# Patient Record
Sex: Female | Born: 1960
Health system: Midwestern US, Community
[De-identification: ages and names within clinical notes are randomized; demographics above are authoritative.]

## PROBLEM LIST (undated history)

## (undated) DIAGNOSIS — J441 Chronic obstructive pulmonary disease with (acute) exacerbation: Principal | ICD-10-CM

## (undated) DIAGNOSIS — Z1231 Encounter for screening mammogram for malignant neoplasm of breast: Secondary | ICD-10-CM

## (undated) DIAGNOSIS — I480 Paroxysmal atrial fibrillation: Principal | ICD-10-CM

## (undated) DIAGNOSIS — R0789 Other chest pain: Secondary | ICD-10-CM

## (undated) DIAGNOSIS — K7200 Acute and subacute hepatic failure without coma: Secondary | ICD-10-CM

## (undated) DIAGNOSIS — F419 Anxiety disorder, unspecified: Secondary | ICD-10-CM

## (undated) DIAGNOSIS — K859 Acute pancreatitis without necrosis or infection, unspecified: Secondary | ICD-10-CM

## (undated) DIAGNOSIS — H269 Unspecified cataract: Secondary | ICD-10-CM

## (undated) DIAGNOSIS — I509 Heart failure, unspecified: Secondary | ICD-10-CM

## (undated) HISTORY — PX: ABDOMINAL HYSTERECTOMY: SHX81

---

## 2006-08-21 DIAGNOSIS — K7200 Acute and subacute hepatic failure without coma: Secondary | ICD-10-CM

## 2006-08-21 HISTORY — DX: Acute and subacute hepatic failure without coma: K72.00

## 2007-09-06 LAB — CBC WITH AUTOMATED DIFF
ABS. BASOPHILS: 0 10*3/uL (ref 0.0–0.1)
ABS. EOSINOPHILS: 0.1 10*3/uL (ref 0.0–0.4)
ABS. LYMPHOCYTES: 2.6 10*3/uL (ref 0.8–3.5)
ABS. MONOCYTES: 0.5 10*3/uL (ref 0–1.0)
ABS. NEUTROPHILS: 8.6 10*3/uL — ABNORMAL HIGH (ref 1.8–8.0)
BAND NEUTROPHILS: 1 % (ref 0–6)
BASOPHILS: 0 % (ref 0–1)
EOSINOPHILS: 1 % (ref 0–7)
HCT: 27 % — ABNORMAL LOW (ref 35.0–47.0)
HGB: 8 g/dL — ABNORMAL LOW (ref 11.5–16.0)
LYMPHOCYTES: 22 % (ref 12–49)
MCH: 23 PG — ABNORMAL LOW (ref 26.0–34.0)
MCHC: 29.6 g/dL — ABNORMAL LOW (ref 30.0–35.0)
MCV: 77.6 FL — ABNORMAL LOW (ref 80.0–99.0)
MONOCYTES: 4 % — ABNORMAL LOW (ref 5–13)
NEUTROPHILS: 72 % (ref 32–75)
PLATELET: 212 10*3/uL (ref 150–400)
RBC: 3.48 M/uL — ABNORMAL LOW (ref 3.80–5.20)
RDW: 23.3 % — ABNORMAL HIGH (ref 11.5–14.5)
WBC: 11.8 10*3/uL — ABNORMAL HIGH (ref 3.6–11.0)

## 2007-09-06 LAB — URINALYSIS W/ REFLEX CULTURE
Blood: NEGATIVE
Glucose: NEGATIVE MG/DL
Nitrites: POSITIVE — AB
Protein: 30 MG/DL — AB
Specific gravity: 1.026 (ref 1.003–1.030)
Urobilinogen: 2 EU/DL — ABNORMAL HIGH (ref 0.2–1.0)
pH (UA): 6 (ref 5.0–8.0)

## 2007-09-06 LAB — PROTHROMBIN TIME + INR
INR: 1.6 — ABNORMAL HIGH (ref 0.9–1.2)
Prothrombin time: 18 SECS — ABNORMAL HIGH (ref 9.5–13.1)

## 2007-09-06 LAB — METABOLIC PANEL, COMPREHENSIVE
A-G Ratio: 0.5 — ABNORMAL LOW (ref 1.1–2.2)
ALT (SGPT): 44 U/L (ref 30–65)
AST (SGOT): 87 U/L — ABNORMAL HIGH (ref 15–37)
Albumin: 2 g/dL — ABNORMAL LOW (ref 3.5–5.0)
Alk. phosphatase: 221 U/L — ABNORMAL HIGH (ref 50–136)
Anion gap: 7 (ref 5–15)
BUN/Creatinine ratio: 19 (ref 12–20)
BUN: 13 MG/DL (ref 6–20)
Bilirubin, total: 6.5 MG/DL — ABNORMAL HIGH (ref 0–1.0)
CO2: 30 MMOL/L (ref 21–32)
Calcium: 8.2 MG/DL — ABNORMAL LOW (ref 8.5–10.1)
Chloride: 99 MMOL/L (ref 97–108)
Creatinine: 0.7 MG/DL (ref 0.6–1.3)
GFR est AA: >60 mL/min/{1.73_m2} (ref >60)
GFR est non-AA: >60 mL/min/{1.73_m2} (ref >60)
Globulin: 4.3 g/dL — ABNORMAL HIGH (ref 2.0–4.0)
Glucose: 95 MG/DL (ref 65–105)
Potassium: 3.5 MMOL/L (ref 3.5–5.1)
Protein, total: 6.3 g/dL — ABNORMAL LOW (ref 6.4–8.2)
Sodium: 136 MMOL/L (ref 136–145)

## 2007-09-06 LAB — AMYLASE: Amylase: 39 U/L (ref 25–115)

## 2007-09-06 LAB — AMMONIA: Ammonia, plasma: 34 umol/L — ABNORMAL HIGH (ref 11–32)

## 2007-09-06 LAB — CK W/ REFLX CKMB
CK: 33 U/L (ref 21–215)
CK: 20 U/L — ABNORMAL LOW (ref 21–215)

## 2007-09-06 LAB — BILIRUBIN, CONFIRM: Bilirubin UA, confirm: POSITIVE — AB

## 2007-09-06 LAB — TROPONIN I
Troponin-I, Qt.: 0.15 ng/mL — ABNORMAL HIGH (ref 0.0–0.05)
Troponin-I, Qt.: 0.1 ng/mL — ABNORMAL HIGH (ref 0.0–0.05)

## 2007-09-06 LAB — PTT, ACTIVATED: aPTT: 34 s (ref 26.5–37.3)

## 2007-09-06 LAB — LIPASE: Lipase: 279 U/L (ref 114–286)

## 2007-09-06 NOTE — Consults (Signed)
Name: Breanna Lester, Breanna Lester Admitted: 09/06/2007  MR #: [id number] DOB: 1961-01-17  Account #: [id number] Age: 47  Consultant: [name], MD Location: [phone]     CONSULTATION REPORT    DATE OF CONSULTATION: 09/06/2007  REFERRING PHYSICIAN:        CHIEF COMPLAINT: Asked to see by Dr. Jamison Neighbor because of abnormal liver  tests, jaundice, abdominal pain, and abnormal radiographic examination of  the GI tract.    HISTORY OF PRESENT ILLNESS: The history is obtained from the patient and  supplemented from the medical record and my outpatient records.    Breanna Lester is 47 years old. For about the last year and a half, she has  had abdominal pain and diarrhea. There has been alternating diarrhea and  constipation and some bright red rectal bleeding. She has had a total of 3  colonoscopies including one by Dr. Sandie Ano and one by me. A colonoscopy  to the proximal transverse colon on September 09, 2003 was negative. A  colonoscopy on March 12, 2007 was normal. Biopsies of the large bowel  showed hyperplastic polyp.    CT on July 3 showed trace pericholecystic fluid. Left fundal serosal  fibroid. Ultrasound on July 25 shows sludge and wall thickening of the  gallbladder raising question of a calculus cholecystitis. Prominent liver  without focal abnormality.    Mild liver test elevation was noted. Alkaline phosphatase total was 228  with 71% liver fraction (25 to 69). Gamma globulin was normal at 1.1.  Prothrombin time was elevated at 1.3. 5'-nucleotidase was elevated at 19.  C-reactive protein was elevated at 20. Total bilirubin was 1.3. AST was  elevated at 88 and ALT was normal. Hemoglobin was 11.5 (from 11.7 to  15.5). Clostridium difficile toxin was negative.    I make note that protein electrophoresis on 02/27/2007 showed a normal  gamma globulin fraction and albumin total of 3.1 and a modest elevation of  alpha1 globulin at 0.4. No apparent protein spike was seen.     She was lost her followup after a September 12th visit. Multiple attempts  from my office to contact her were not successful, and a letter was sent to  her primary care physician, and she got a copy of this.    She felt as if she got "flu" in the fall of this year. There was weakness  and fatigue. There was some nausea. There was continuing weight loss.  She had upper respiratory symptoms. Some shortness of breath got worse or  fatigue got worse when she came to the ER yesterday. There is no known  liver disease. There is no history of intravenous drug use. There is no  history of significant alcohol use.    FAMILY HISTORY: Negative for liver disease, positive for a sister with  Crohn disease, and for coronary artery disease in her father.    MEDICATIONS: On admission, Zoloft 50 mg per day.    REVIEW OF SYSTEMS: Obtained in detail. There has been some chill. There  has been a 70-pound weight loss. She has chronic sinus symptoms. There is  photophobia, some dizziness, and worsening shortness of breath. There is  also arthritis. There is no abdominal pain. There is no postprandial  pain.    Her most recent upper endoscopy on 09/17/2003 showed healing prepyloric  erosions, irregular Z-line, and mild duodenitis.    PHYSICAL EXAMINATION:  VITAL SIGNS: Temperature 98.2, pulse 111, blood pressure 134/54, and  respirations 18.  GENERAL: Cachectic, temporal wasting.  EYES: Jaundice is present.  ENMT: Unremarkable.  SKIN: Complexion is sallow. There is no palmar erythema. There are large  spiders on the left neck and chest.  CHEST: Diffuse rhonchi and increased breath sounds bilaterally. There is  some labored respiration at baseline.  HEART: Regular rate and rhythm. Tachycardia.  ABDOMEN: Tense, distended, right upper quadrant tenderness.  EXTREMITIES: 1+ edema.  RECTUM: Deferred.  LYMPHATIC: No anterior, posterior, cervical, supraclavicular, or inguinal  lymphadenopathy.     LABORATORY DATA: Shows that hemoglobin is 8. It had been 9.5 on November  4. The platelet count is 212,000 and a white blood cell count is elevated  at 11.8. INR is elevated at 1.6. Sodium and potassium normal. Albumin 2,  globulin 4.3, total bilirubin 6.5, alkaline phosphatase 221, and SGOT 87.  Ammonia 34.    DIAGNOSTIC DATA: I reviewed the CT scan and ultrasound with radiologist.    IMPRESSION REPORT AND PLAN:  1. Probable cirrhosis.  2. Anasarca.  3. Right upper quadrant tenderness.  4. Low albumin.  5. Anemia, not otherwise specified. The MCV is low, suggesting iron  deficiency.  6. Abnormal radiographic examination demonstrating pericholecystic  fluid in sludge or stones.    RECOMMENDATIONS:  1. I have ordered a number of blood tests to be performed to look for  treatable chronic liver disease. This included serologies for hepatitis B  and C, repeat protein electrophoresis, antinuclear antibody,  antimitochondrial antibody, alpha-fetoprotein, iron, TIBC, ferritin,  ceruloplasmin, and uric acid. I will also redraw her coags checking PT and  INR.  2. Paracentesis to look for bacterial peritonitis and infection.  3. Surgery consult. I am concerned on her appearance that she may have  a carcinomatosis. However, she had a surgery in her abdomen recently  (July 02, 2007). The liver was not described in the operative report.  I have spoken to Dr. Cheryll Cockayne to evaluate here. I am concerned about  the possibility of gallbladder carcinoma or some other significant process  that could involve the gallbladder, liver, and the peritoneum.  4. She will likely need a liver biopsy. We will do this more than  likely on 09/09/2007 or 09/10/2007.  5. I discussed with the patient the likelihood of cirrhosis is present  and/or current lack of a diagnosis.  6. She may require other tests including repeat endoscopy, capsule  endoscopy, or other tests to evaluate the anemia.     I thank Dr. Jamison Neighbor for this interesting consultation.          E-Signed By  [name], MD 09/18/2007 13:49    [name], MD    cc: Doree Fudge, M.D.   Ned Grace, MD   [name], MD        RFK/FI2; D: 09/06/2007 4:11 P; T: 09/06/2007 7:58 P; DOC# [id number]; Job#  [id number]

## 2007-09-06 NOTE — H&P (Signed)
Name: Breanna Lester, Breanna Lester Admitted: 09/05/2007  MR #: [id number] DOB: Dec 01, 1960  Account #: [id number] Age: 47  Physician: Pamalee Leyden, MD Location: [phone]     HISTORY PHYSICAL      CHIEF COMPLAINT: Concerns for blood in her urine.    HISTORY OF PRESENT ILLNESS: This is a 47 year old Caucasian female with a  history of weight loss dating back to early last year. She was evaluated  by [name] back in October 2008, at which time she lost close to 50  pounds. Her workup included what sounded like a CT scan of her abdomen and  both upper and lower endoscopies. She reports being diagnosed with a colon  polyp, hemorrhoids, and an old or healed ulcer in her stomach. She was  felt to have possibly irritable bowel syndrome. She has had on and off  episodes of loose stools and abdominal pain. She had a hysterectomy in  November 2008, and since then, she reports additional weight loss close to  20 pounds for a total of about 70 or 80 pounds of weight loss since this  all started. Her appetite has remained poor. She noticed dark urine this  past few weeks, and today, thought she felt blood in her urine. She  presents to the Emergency Room with this complaint. In the ER, the workup  was remarkable for her total bilirubin being elevated at 6.5. Her  urinalysis had no blood but 5 to 10 white cells. Chest x-ray demonstrates  bilateral opacities or infiltrates, and an abdominal ultrasound  demonstrates gallbladder sludge. No intra or extrahepatic biliary duct  dilatation, and the visualized pancreas was felt to be normal. A CT scan  of her abdomen has been requested, and at this point, we were asked to  admit the patient to further evaluate her jaundice and weight loss.    ALLERGIES: No known drug allergies.    MEDICATIONS: Zoloft 50 mg daily.    PAST MEDICAL HISTORY:  1. Anxiety disorder.  2. GERD.  3. DJD.  4. History of what is felt to be IBS.  5. Colonoscopy last year demonstrating 1 polyp and hemorrhoids.   6. Esophagogastroduodenoscopy reportedly demonstrates just an old  healed ulcer.    SOCIAL HISTORY: This patient is married and smokes 1 pack a day. She does  not drink alcohol. She is unemployed.    FAMILY HISTORY: Mother with both breast and uterine cancer. She has a  sister with Crohn disease.    REVIEW OF SYSTEMS: CONSTITUTIONAL: Remarkable for the 70 or 80 pound  weight loss as described. Positive for anorexia. She does not report  fevers or chills. EYES: No complaints. ENT: No sore throat or ear pain.  CARDIOVASCULAR: Denies chest pains or palpitations. RESPIRATORY: She  admits to a somewhat chronic cough, productive occasionally of clear  sputum. No shortness of breath. GI: Positive for on and off diarrhea and  abdominal discomfort. No vomiting. She occasionally is nauseated. GU:  Remarkable for the dark urine, which she thought was blood. SKIN: She was  not aware that she was jaundiced until that was pointed out to her today.  No rashes. NEUROLOGIC: Denies headaches or dizziness. Rest of the system  review is negative.    OBJECTIVE:  VITAL SIGNS: Blood pressure is 110/60, pulse 108, respiratory rate 18,  temperature is 96.7, and room air saturation 95%.  GENERAL APPEARANCE: We have a chronically ill-appearing female, lying  semisupine. She clearly is jaundiced. Eyes are icteric. Mucous membranes  are dry.  NECK: No obvious masses.  RESPIRATORY: No accessory muscle use. No wheezing, bibasilar crackles.  CARDIOVASCULAR: Regular rhythm, tachycardic. No thrills. Trace pitting  edema bilaterally.  ABDOMEN: Appears distended. There is tenderness over her epigastric area  and lower quadrants.  MUSCULOSKELETAL EXAMINATION: Atrophic appearing extremities.  PSYCHIATRIC: A bit anxious.  NEUROLOGIC: She is alert and oriented. Speech is not slurred. Grossly  nonfocal examination.    LABORATORY DATA: White count 11.8, hemoglobin 8, hematocrit 27, and  platelet 212,000. Sodium 136, potassium 3.5, BUN 13, creatinine 0.7,   glucose 95, total bilirubin 6.5, alkaline phosphatase 221, ammonia is 34,  SGOT 87, SGPT 44, CPK is 33, troponin 0.15. Urinalysis, 10 white cells,  positive nitrites, and small leukocyte esterase. Chest x-ray demonstrates  bilateral infiltrates or opacities. Ultrasound of the abdomen demonstrates  gallbladder sludge with equivocal sonographic evidence of acute  cholecystitis. She has nonspecific hepatosplenomegaly that is unchanged.  No dilated ducts.    ASSESSMENT/PLAN:  1. Hyperbilirubinemia/jaundice.  2. Abdominal pain and tenderness.  3. Significant weight loss, close to 80 pounds.  4. Suspect underlying malignancy.  5. Bilateral pulmonary infiltrates.  6. Anemia.  7. Urinary tract infection.  8. Smoker.    This patient needs a CT scan of her abdomen. I will hydrate her overnight  and have [name] consult in the morning. We will send a urine culture  and start her on antibiotics. We will fractionate also her bilirubin with  her next blood work. I am concerned that the pulmonary process may be  related to her intra-abdominal process. I have discussed code status with  her, and she wishes to remain a full code for now.    Total time 60 minutes.            E-Signed By  Pamalee Leyden, MD 09/07/2007 18:08    Pamalee Leyden, MD    cc: Doree Fudge, M.D.   [name], MD   Pamalee Leyden, MD        JLT/FI2; D: 09/06/2007 12:23 A; T: 09/06/2007 3:38 A; DOC# [id number]; Job#  [id number]

## 2007-09-07 LAB — CBC WITH AUTOMATED DIFF
ABS. BASOPHILS: 0 10*3/uL (ref 0.0–0.1)
ABS. EOSINOPHILS: 0.1 10*3/uL (ref 0.0–0.4)
ABS. LYMPHOCYTES: 2.8 10*3/uL (ref 0.8–3.5)
ABS. MONOCYTES: 0.9 10*3/uL (ref 0–1.0)
ABS. NEUTROPHILS: 8.7 10*3/uL — ABNORMAL HIGH (ref 1.8–8.0)
BASOPHILS: 0 % (ref 0–1)
EOSINOPHILS: 1 % (ref 0–7)
HCT: 24.4 % — ABNORMAL LOW (ref 35.0–47.0)
HGB: 7.2 g/dL — ABNORMAL LOW (ref 11.5–16.0)
LYMPHOCYTES: 22 % (ref 12–49)
MCH: 22.8 PG — ABNORMAL LOW (ref 26.0–34.0)
MCHC: 29.5 g/dL — ABNORMAL LOW (ref 30.0–35.0)
MCV: 77.2 FL — ABNORMAL LOW (ref 80.0–99.0)
MONOCYTES: 7 % (ref 5–13)
NEUTROPHILS: 70 % (ref 32–75)
PLATELET: 191 10*3/uL (ref 150–400)
RBC: 3.16 M/uL — ABNORMAL LOW (ref 3.80–5.20)
RDW: 22.6 % — ABNORMAL HIGH (ref 11.5–14.5)
WBC: 12.5 10*3/uL — ABNORMAL HIGH (ref 3.6–11.0)

## 2007-09-07 LAB — METABOLIC PANEL, COMPREHENSIVE
A-G Ratio: 0.4 — ABNORMAL LOW (ref 1.1–2.2)
ALT (SGPT): 38 U/L (ref 30–65)
AST (SGOT): 50 U/L — ABNORMAL HIGH (ref 15–37)
Albumin: 1.8 g/dL — ABNORMAL LOW (ref 3.5–5.0)
Alk. phosphatase: 202 U/L — ABNORMAL HIGH (ref 50–136)
Anion gap: 11 (ref 5–15)
BUN/Creatinine ratio: 17 (ref 12–20)
BUN: 12 MG/DL (ref 6–20)
Bilirubin, total: 6 MG/DL — ABNORMAL HIGH (ref 0–1.0)
CO2: 27 MMOL/L (ref 21–32)
Calcium: 8 MG/DL — ABNORMAL LOW (ref 8.5–10.1)
Chloride: 98 MMOL/L (ref 97–108)
Creatinine: 0.7 MG/DL (ref 0.6–1.3)
GFR est AA: >60 mL/min/{1.73_m2} (ref >60)
GFR est non-AA: >60 mL/min/{1.73_m2} (ref >60)
Globulin: 4.1 g/dL — ABNORMAL HIGH (ref 2.0–4.0)
Glucose: 100 MG/DL (ref 65–105)
Potassium: 3.7 MMOL/L (ref 3.5–5.1)
Protein, total: 5.9 g/dL — ABNORMAL LOW (ref 6.4–8.2)
Sodium: 136 MMOL/L (ref 136–145)

## 2007-09-07 LAB — PROTHROMBIN TIME + INR
INR: 1.7 — ABNORMAL HIGH (ref 0.9–1.2)
Prothrombin time: 19.7 SECS — ABNORMAL HIGH (ref 9.5–13.1)

## 2007-09-07 LAB — CULTURE, URINE
Colonies Counted: <1000
Colony Count: <1000
Culture result:: NO GROWTH
Culture: NO GROWTH

## 2007-09-07 LAB — TIBC: TIBC: 218 ug/dL — ABNORMAL LOW (ref 250–450)

## 2007-09-07 LAB — IRON: Iron: 18 ug/dL — ABNORMAL LOW (ref 35–150)

## 2007-09-07 LAB — PTT, ACTIVATED: aPTT: 34.9 s (ref 26.5–37.3)

## 2007-09-07 LAB — FERRITIN: Ferritin: 56 NG/ML (ref 10–291)

## 2007-09-07 LAB — URIC ACID: Uric acid: 2.7 MG/DL (ref 2.6–7.2)

## 2007-09-07 NOTE — Consults (Signed)
Name: Breanna Lester, Breanna Lester Admitted: 09/06/2007  MR #: [id number] DOB: [birth year]  Account #: [id number] Age: 47  Consultant: Elayne Guerin, M.D. Location: [id number]     CONSULTATION REPORT    DATE OF CONSULTATION: 09/06/2007  REFERRING PHYSICIAN:      REFERRING PHYSICIAN: [name], MD    CHIEF COMPLAINT: Abdominal pain.    HISTORY OF PRESENT ILLNESS: Breanna Lester is a 47 year old female who states  she has not been feeling well since having a hysterectomy in November of  last year. She presented to the ER last night due to gross hematuria.  Evaluation in the Emergency Department found multiple problems including  significant anemia and elevated bilirubin to 6.5, multiple liver function  test elevations, decreased albumin with anasarca. In order to further  evaluate her elevated bilirubin, an abdominal ultrasound was obtained,  which showed no evidence of gallstones, but did suggest the presence of  gallbladder sludge. There was a sonographic Murphy sign, but no  gallbladder wall thickening and no biliary dilation. A CT scan of the  abdomen and pelvis was done and this showed no mass lesion. The liver is  enlarged. The gallbladder is distended. There are either the layering  gallstones or gallbladder sludge within the gallbladder. There is a large  amount of ascites. There are bilateral pleural effusions, and there is  diffuse edema throughout all of the subcutaneous tissue consistent with  anasarca. The patient states she was not really aware of any abdominal  pain until she came to the hospital and people began palpating her abdomen.  Her primary source of pain is her hemorrhoids. Surgical consultation is  requested to evaluate for possible cholecystitis.    PAST MEDICAL HISTORY: Gastroesophageal reflux anxiety disorder, arthritis,  the patient has been evaluated by [name] for a 70-pound unintentional  weight loss over about the last 14 months. She was concerned that she   might have Crohn disease. Apparently, this workup including an EGD and  colonoscopy was relatively unrevealing.    PAST SURGICAL HISTORY: Laparoscopic hysterectomy, colonoscopy, cesarean  section x2.    MEDICATIONS: Zoloft.    ALLERGIES: NKDA.    SOCIAL HISTORY: She smokes 1 pack a day or a little less. She denies any  history of regular alcohol consumption or intravenous drug abuse.    FAMILY HISTORY: Positive for breast cancer and uterine cancer and Crohn  disease.    REVIEW OF SYSTEMS: Constitutional: A 70-pound weight loss as described  above, anorexia. Cardiac: Denies chest pain. Respiratory: She does have  a chronic cough, denies shortness of breath. Musculoskeletal: She does  have diffuse joint pains consistent with a chronic arthritis.  Gastrointestinal: She does have bright red blood in her stool, which she  attributes to hemorrhoids. Genitourinary: Denies dysuria, but does report  a recent onset of gross hematuria. Hematologic: Denies bruising.  Psychiatric: She does have anxiety.    PHYSICAL EXAMINATION:  VITAL SIGNS: Temperature 96.5, blood pressure 116/64, pulse 103,  respirations 18.  GENERAL: Pleasant female, obviously jaundiced, obviously edematous,  appears chronically ill but nontoxic.  HEENT: Icteric sclerae.  NECK: Supple.  CHEST: Clear bilaterally.  CARDIAC: Regular, tachycardic, no murmurs.  ABDOMEN: Soft, mildly distended. The liver and spleen are enlarged. Her  liver is tender. It is difficult to determine whether or not her  gallbladder is discretely tender, as her liver is obviously tender to  palpation. There is no peritonitis, no hernias.  EXTREMITIES: Diffuse edema.  NEUROLOGICAL: Alert and oriented. No deficits.  LABORATORY DATA: White blood cell count 11.8, hemoglobin 8.0, platelet  count 212,000. INR 1.6. Albumin 2.0, globulin 4.3, bilirubin total 6.5,  alkaline phosphatase 221, SGOT 87, ammonia 34.    IMPRESSION: Breanna Lester does not have clear evidence of cholecystitis. 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 There is certainly no  indication for laparoscopic cholecystectomy or laparoscopy at this point.  I suppose it is possible she has an occult malignancy, but I doubt this.  She may eventually need a diagnostic laparoscopy and possible  cholecystectomy at that time, if her medical workup is unrevealing.    RECOMMENDATIONS: At the present time, I agree with the hepatology  evaluation that [name] has undertaken. She is already scheduled for a  diagnostic paracentesis by Radiology on Monday. I believe that she should  be followed by ultrasound-guided liver biopsy if her prothrombin time can  be improved. If her laboratory studies, paracentesis, and liver biopsy are  unrevealing, I would be willing to perform a diagnostic laparoscopy if  needed. I will follow along with her during this hospital stay. There is  no need for surgery until her medical workup has been completed. I will  check back after the weekend.        E-Signed By  Elayne Guerin, M.D. 10/31/2007 23:58    Elayne Guerin, M.D.    cc: Doree Fudge, M.D.   [name], MD   Elayne Guerin, M.D.        DT/FI2; D: 09/06/2007 10:43 P; T: 09/07/2007 1:31 A; DOC# [id number]; Job#  [id number]

## 2007-09-08 LAB — METABOLIC PANEL, COMPREHENSIVE
A-G Ratio: 0.5 — ABNORMAL LOW (ref 1.1–2.2)
ALT (SGPT): 35 U/L (ref 30–65)
AST (SGOT): 46 U/L — ABNORMAL HIGH (ref 15–37)
Albumin: 1.9 g/dL — ABNORMAL LOW (ref 3.5–5.0)
Alk. phosphatase: 190 U/L — ABNORMAL HIGH (ref 50–136)
Anion gap: 9 (ref 5–15)
BUN/Creatinine ratio: 15 (ref 12–20)
BUN: 12 MG/DL (ref 6–20)
Bilirubin, total: 6.2 MG/DL — ABNORMAL HIGH (ref 0–1.0)
CO2: 29 MMOL/L (ref 21–32)
Calcium: 7.7 MG/DL — ABNORMAL LOW (ref 8.5–10.1)
Chloride: 95 MMOL/L — ABNORMAL LOW (ref 97–108)
Creatinine: 0.8 MG/DL (ref 0.6–1.3)
GFR est AA: >60 mL/min/{1.73_m2} (ref >60)
GFR est non-AA: >60 mL/min/{1.73_m2} (ref >60)
Globulin: 4 g/dL (ref 2.0–4.0)
Glucose: 94 MG/DL (ref 65–105)
Potassium: 2.7 MMOL/L — CL (ref 3.5–5.1)
Protein, total: 5.9 g/dL — ABNORMAL LOW (ref 6.4–8.2)
Sodium: 133 MMOL/L — ABNORMAL LOW (ref 136–145)

## 2007-09-08 LAB — CULTURE, URINE
Colonies Counted: <1000
Colony Count: <1000
Culture result:: NO GROWTH
Culture: NO GROWTH

## 2007-09-08 LAB — CBC W/O DIFF
HCT: 26.3 % — ABNORMAL LOW (ref 35.0–47.0)
HGB: 8 g/dL — ABNORMAL LOW (ref 11.5–16.0)
MCH: 23.4 PG — ABNORMAL LOW (ref 26.0–34.0)
MCHC: 30.4 g/dL (ref 30.0–35.0)
MCV: 76.9 FL — ABNORMAL LOW (ref 80.0–99.0)
PLATELET: 172 10*3/uL (ref 150–400)
RBC: 3.42 M/uL — ABNORMAL LOW (ref 3.80–5.20)
RDW: 21.5 % — ABNORMAL HIGH (ref 11.5–14.5)
WBC: 13.5 10*3/uL — ABNORMAL HIGH (ref 3.6–11.0)

## 2007-09-08 LAB — PROTHROMBIN TIME + INR
INR: 1.8 — ABNORMAL HIGH (ref 0.9–1.2)
Prothrombin time: 20.6 SECS — ABNORMAL HIGH (ref 9.5–13.1)

## 2007-09-08 LAB — PTT, ACTIVATED: aPTT: 33.9 s (ref 26.5–37.3)

## 2007-09-08 LAB — MAGNESIUM: Magnesium: 1.7 MG/DL (ref 1.6–2.4)

## 2007-09-09 LAB — CBC WITH AUTOMATED DIFF
ABS. BASOPHILS: 0 10*3/uL (ref 0.0–0.1)
ABS. EOSINOPHILS: 0.1 10*3/uL (ref 0.0–0.4)
ABS. LYMPHOCYTES: 3.1 10*3/uL (ref 0.8–3.5)
ABS. MONOCYTES: 1.2 10*3/uL — ABNORMAL HIGH (ref 0–1.0)
ABS. NEUTROPHILS: 9 10*3/uL — ABNORMAL HIGH (ref 1.8–8.0)
BASOPHILS: 0 % (ref 0–1)
EOSINOPHILS: 1 % (ref 0–7)
HCT: 26.4 % — ABNORMAL LOW (ref 35.0–47.0)
HGB: 8.2 g/dL — ABNORMAL LOW (ref 11.5–16.0)
LYMPHOCYTES: 23 % (ref 12–49)
MCH: 23.8 PG — ABNORMAL LOW (ref 26.0–34.0)
MCHC: 31.1 g/dL (ref 30.0–35.0)
MCV: 76.5 FL — ABNORMAL LOW (ref 80.0–99.0)
MONOCYTES: 9 % (ref 5–13)
NEUTROPHILS: 67 % (ref 32–75)
PLATELET: 159 10*3/uL (ref 150–400)
RBC: 3.45 M/uL — ABNORMAL LOW (ref 3.80–5.20)
RDW: 21.7 % — ABNORMAL HIGH (ref 11.5–14.5)
WBC: 13.4 10*3/uL — ABNORMAL HIGH (ref 3.6–11.0)

## 2007-09-09 LAB — METABOLIC PANEL, COMPREHENSIVE
A-G Ratio: 0.4 — ABNORMAL LOW (ref 1.1–2.2)
ALT (SGPT): 34 U/L (ref 30–65)
AST (SGOT): 43 U/L — ABNORMAL HIGH (ref 15–37)
Albumin: 1.9 g/dL — ABNORMAL LOW (ref 3.5–5.0)
Alk. phosphatase: 194 U/L — ABNORMAL HIGH (ref 50–136)
Anion gap: 10 (ref 5–15)
BUN/Creatinine ratio: 17 (ref 12–20)
BUN: 12 MG/DL (ref 6–20)
Bilirubin, total: 6.1 MG/DL — ABNORMAL HIGH (ref 0–1.0)
CO2: 29 MMOL/L (ref 21–32)
Calcium: 7.8 MG/DL — ABNORMAL LOW (ref 8.5–10.1)
Chloride: 96 MMOL/L — ABNORMAL LOW (ref 97–108)
Creatinine: 0.7 MG/DL (ref 0.6–1.3)
GFR est AA: >60 mL/min/{1.73_m2} (ref >60)
GFR est non-AA: >60 mL/min/{1.73_m2} (ref >60)
Globulin: 4.3 g/dL — ABNORMAL HIGH (ref 2.0–4.0)
Glucose: 97 MG/DL (ref 65–105)
Potassium: 3.7 MMOL/L (ref 3.5–5.1)
Protein, total: 6.2 g/dL — ABNORMAL LOW (ref 6.4–8.2)
Sodium: 135 MMOL/L — ABNORMAL LOW (ref 136–145)

## 2007-09-09 LAB — TYPE & SCREEN
ABO/Rh(D): A POS
Antibody screen: NEGATIVE
Status of unit: TRANSFUSED

## 2007-09-09 LAB — CERULOPLASMIN: Ceruloplasmin: 24 mg/dL (ref 17–54)

## 2007-09-09 LAB — METABOLIC PANEL, BASIC
Anion gap: 8 (ref 5–15)
BUN/Creatinine ratio: 14 (ref 12–20)
BUN: 13 MG/DL (ref 6–20)
CO2: 29 MMOL/L (ref 21–32)
Calcium: 7.7 MG/DL — ABNORMAL LOW (ref 8.5–10.1)
Chloride: 95 MMOL/L — ABNORMAL LOW (ref 97–108)
Creatinine: 0.9 MG/DL (ref 0.6–1.3)
GFR est AA: >60 mL/min/{1.73_m2} (ref >60)
GFR est non-AA: >60 mL/min/{1.73_m2} (ref >60)
Glucose: 157 MG/DL — ABNORMAL HIGH (ref 65–105)
Potassium: 3.6 MMOL/L (ref 3.5–5.1)
Sodium: 132 MMOL/L — ABNORMAL LOW (ref 136–145)

## 2007-09-09 LAB — CEA: CEA: 6 NG/ML — ABNORMAL HIGH (ref 0.0–5.0)

## 2007-09-09 LAB — PROTHROMBIN TIME + INR
INR: 1.8 — ABNORMAL HIGH (ref 0.9–1.2)
Prothrombin time: 21.1 SECS — ABNORMAL HIGH (ref 9.5–13.1)

## 2007-09-09 LAB — ANA BY MULTIPLEX FLOW IA, QL
ANA, Direct: NOT DETECTED
ANA: NOT DETECTED

## 2007-09-09 LAB — SODIUM, UR, RANDOM: Sodium,urine random: 9 MMOL/L — ABNORMAL LOW (ref 20–110)

## 2007-09-09 LAB — MAGNESIUM: Magnesium: 1.8 MG/DL (ref 1.6–2.4)

## 2007-09-09 LAB — AFP, TUMOR MARKER
AFP (TUMOR MARKER),FETO1: 2 ng/mL (ref 0–15)
AFP (TUMOR MARKER): 2 ng/mL (ref 0–15)

## 2007-09-09 LAB — OSMOLALITY, UR: Osmolality,urine: 564 MOSM/kg H2O (ref 300–1300)

## 2007-09-09 LAB — TYPE AND SCREEN
ABO/Rh: A POS
Antibody Screen: NEGATIVE
Status: TRANSFUSED

## 2007-09-10 LAB — PROTHROMBIN TIME + INR
INR: 1.7 — ABNORMAL HIGH (ref 0.9–1.2)
INR: 1.7 — ABNORMAL HIGH (ref 0.9–1.2)
Prothrombin time: 19.2 SECS — ABNORMAL HIGH (ref 9.5–13.1)
Prothrombin time: 19.8 SECS — ABNORMAL HIGH (ref 9.5–13.1)

## 2007-09-10 LAB — METABOLIC PANEL, COMPREHENSIVE
A-G Ratio: 0.5 — ABNORMAL LOW (ref 1.1–2.2)
ALT (SGPT): 32 U/L (ref 30–65)
AST (SGOT): 43 U/L — ABNORMAL HIGH (ref 15–37)
Albumin: 2.1 g/dL — ABNORMAL LOW (ref 3.5–5.0)
Alk. phosphatase: 216 U/L — ABNORMAL HIGH (ref 50–136)
Anion gap: 7 (ref 5–15)
BUN/Creatinine ratio: 20 (ref 12–20)
BUN: 16 MG/DL (ref 6–20)
Bilirubin, total: 6 MG/DL — ABNORMAL HIGH (ref 0–1.0)
CO2: 30 MMOL/L (ref 21–32)
Calcium: 7.9 MG/DL — ABNORMAL LOW (ref 8.5–10.1)
Chloride: 98 MMOL/L (ref 97–108)
Creatinine: 0.8 MG/DL (ref 0.6–1.3)
GFR est AA: >60 mL/min/{1.73_m2} (ref >60)
GFR est non-AA: >60 mL/min/{1.73_m2} (ref >60)
Globulin: 4.3 g/dL — ABNORMAL HIGH (ref 2.0–4.0)
Glucose: 138 MG/DL — ABNORMAL HIGH (ref 65–105)
Potassium: 3.5 MMOL/L (ref 3.5–5.1)
Protein, total: 6.4 g/dL (ref 6.4–8.2)
Sodium: 135 MMOL/L — ABNORMAL LOW (ref 136–145)

## 2007-09-10 LAB — CBC WITH AUTOMATED DIFF
ABS. BASOPHILS: 0 10*3/uL (ref 0.0–0.1)
ABS. EOSINOPHILS: 0.1 10*3/uL (ref 0.0–0.4)
ABS. LYMPHOCYTES: 2.1 10*3/uL (ref 0.8–3.5)
ABS. MONOCYTES: 1.7 10*3/uL — ABNORMAL HIGH (ref 0–1.0)
ABS. NEUTROPHILS: 10.3 10*3/uL — ABNORMAL HIGH (ref 1.8–8.0)
BASOPHILS: 0 % (ref 0–1)
EOSINOPHILS: 1 % (ref 0–7)
HCT: 26.4 % — ABNORMAL LOW (ref 35.0–47.0)
HGB: 7.9 g/dL — ABNORMAL LOW (ref 11.5–16.0)
LYMPHOCYTES: 15 % (ref 12–49)
MCH: 23.7 PG — ABNORMAL LOW (ref 26.0–34.0)
MCHC: 29.9 g/dL — ABNORMAL LOW (ref 30.0–35.0)
MCV: 79 FL — ABNORMAL LOW (ref 80.0–99.0)
MONOCYTES: 12 % (ref 5–13)
NEUTROPHILS: 72 % (ref 32–75)
PLATELET: 163 10*3/uL (ref 150–400)
RBC: 3.34 M/uL — ABNORMAL LOW (ref 3.80–5.20)
RDW: 22.2 % — ABNORMAL HIGH (ref 11.5–14.5)
WBC: 14.2 10*3/uL — ABNORMAL HIGH (ref 3.6–11.0)

## 2007-09-10 LAB — PLASMA, ALLOCATE
Status of unit: TRANSFUSED
Status of unit: TRANSFUSED

## 2007-09-10 LAB — HEPATITIS PANEL, ACUTE
HEP B CORE Ab, IgM: NEGATIVE
HEPATITIS A Ab, IgM: NEGATIVE
Hep B surface Ag: NEGATIVE
Hepatitis C Ab: NEGATIVE

## 2007-09-10 LAB — PROTEIN ELECTROPHORESIS
Albumin: 2.34 g/dL — ABNORMAL LOW (ref 3.75–5.01)
Alpha-1: 0.38 g/dL (ref 0.19–0.46)
Alpha-2: 0.59 g/dL (ref 0.48–1.05)
Beta: 0.97 g/dL (ref 0.48–1.10)
Gamma: 1.41 g/dL (ref 0.62–1.51)
Total protein: 5.7 g/dL — ABNORMAL LOW (ref 6.00–8.30)

## 2007-09-10 LAB — CULTURE, BLOOD, PAIRED
Culture result:: NO GROWTH
Report Status: 1202009

## 2007-09-10 LAB — CA 19-9: CA 19-9: 14 U/mL (ref 0–37)

## 2007-09-10 LAB — HGB & HCT
HCT: 27 % — ABNORMAL LOW (ref 35.0–47.0)
HGB: 8.2 g/dL — ABNORMAL LOW (ref 11.5–16.0)

## 2007-09-10 LAB — MITOCHONDRIAL M2 AB: Mitochondrial M2 Ab: 0 Units (ref 0.0–0.9)

## 2007-09-11 LAB — MAGNESIUM: Magnesium: 1.7 MG/DL (ref 1.6–2.4)

## 2007-09-11 LAB — CBC WITH AUTOMATED DIFF
ABS. BASOPHILS: 0 10*3/uL (ref 0.0–0.1)
ABS. EOSINOPHILS: 0.1 10*3/uL (ref 0.0–0.4)
ABS. LYMPHOCYTES: 3.3 10*3/uL (ref 0.8–3.5)
ABS. MONOCYTES: 1.3 10*3/uL — ABNORMAL HIGH (ref 0–1.0)
ABS. NEUTROPHILS: 10.1 10*3/uL — ABNORMAL HIGH (ref 1.8–8.0)
BASOPHILS: 0 % (ref 0–1)
EOSINOPHILS: 1 % (ref 0–7)
HCT: 26.1 % — ABNORMAL LOW (ref 35.0–47.0)
HGB: 7.9 g/dL — ABNORMAL LOW (ref 11.5–16.0)
LYMPHOCYTES: 22 % (ref 12–49)
MCH: 23.7 PG — ABNORMAL LOW (ref 26.0–34.0)
MCHC: 30.3 g/dL (ref 30.0–35.0)
MCV: 78.4 FL — ABNORMAL LOW (ref 80.0–99.0)
MONOCYTES: 9 % (ref 5–13)
NEUTROPHILS: 68 % (ref 32–75)
PLATELET: 161 10*3/uL (ref 150–400)
RBC: 3.33 M/uL — ABNORMAL LOW (ref 3.80–5.20)
RDW: 22.4 % — ABNORMAL HIGH (ref 11.5–14.5)
WBC: 14.8 10*3/uL — ABNORMAL HIGH (ref 3.6–11.0)

## 2007-09-11 LAB — BLOOD GAS ARTERIAL,VENT
BASE EXCESS: 3.4 mmol/L — AB
BICARBONATE: 27 mmol/L — ABNORMAL HIGH (ref 22–26)
FIO2: 21
O2 SAT: 88 % — ABNORMAL LOW (ref 92–97)
PCO2: 39 mmHg (ref 35–45)
PO2: 51 mmHg — CL (ref 80–100)
SPONTANEOUS RATE: 20
pH: 7.46 — ABNORMAL HIGH (ref 7.35–7.45)

## 2007-09-11 LAB — PROTHROMBIN TIME + INR
INR: 1.7 — ABNORMAL HIGH (ref 0.9–1.2)
Prothrombin time: 20.2 SECS — ABNORMAL HIGH (ref 9.5–13.1)

## 2007-09-11 LAB — AMMONIA: Ammonia, plasma: 42 umol/L — ABNORMAL HIGH (ref 11–32)

## 2007-09-11 LAB — METABOLIC PANEL, COMPREHENSIVE
A-G Ratio: 0.5 — ABNORMAL LOW (ref 1.1–2.2)
ALT (SGPT): 32 U/L (ref 30–65)
AST (SGOT): 35 U/L (ref 15–37)
Albumin: 2 g/dL — ABNORMAL LOW (ref 3.5–5.0)
Alk. phosphatase: 203 U/L — ABNORMAL HIGH (ref 50–136)
Anion gap: 9 (ref 5–15)
BUN/Creatinine ratio: 19 (ref 12–20)
BUN: 13 MG/DL (ref 6–20)
Bilirubin, total: 5.2 MG/DL — ABNORMAL HIGH (ref 0–1.0)
CO2: 28 MMOL/L (ref 21–32)
Calcium: 8 MG/DL — ABNORMAL LOW (ref 8.5–10.1)
Chloride: 98 MMOL/L (ref 97–108)
Creatinine: 0.7 MG/DL (ref 0.6–1.3)
GFR est AA: >60 mL/min/{1.73_m2} (ref >60)
GFR est non-AA: >60 mL/min/{1.73_m2} (ref >60)
Globulin: 4.2 g/dL — ABNORMAL HIGH (ref 2.0–4.0)
Glucose: 114 MG/DL — ABNORMAL HIGH (ref 65–105)
Potassium: 3.4 MMOL/L — ABNORMAL LOW (ref 3.5–5.1)
Protein, total: 6.2 g/dL — ABNORMAL LOW (ref 6.4–8.2)
Sodium: 135 MMOL/L — ABNORMAL LOW (ref 136–145)

## 2007-09-11 NOTE — Procedures (Signed)
Procedures signed by  at 09/15/07  1338                 Author: Billy Fischer, MD  Service: --  Author Type: Physician            Filed:   Date of Service: 09/11/07 1450  Status: Signed            Procedure Orders        1. ECHO [[id number]] ordered by  at 09/11/07 1450                         <!--EPICS--> Name:      Breanna Lester, Breanna Lester               Admitted:   09/06/2007<BR> MR #:      [id number]                     DOB:        02-Jul-1961<BR>  Account #: [id number]                  Age         46<BR> Ref MD:                                  Location:   [id number]<BR> <BR>                           ECHOCARDIOGRAPHY REPORT<BR> <BR> STUDY DATE:  09/11/2007<BR> <BR> ORDERING PHYSICIAN:  Drue Stager, M.D.<BR> <BR> INDICATION:  Pneumonia, Hematuria, abdominal pain.<BR> <BR> FINDINGS:<BR>    1. Normal LV cavity size and wall thickness with normal LV systolic<BR>    function, ejection fraction estimated at 65-70%.<BR>    2. Normal right ventricular  size and systolic function.<BR>    3. Normal atrial sizes.<BR>    4. Normal aortic root dimensions.<BR>    5. Structurally normal, trileaflet aortic valve without restriction or<BR>    regurgitation.<BR>    6. Structurally normal mitral valve with mild  mitral regurgitation.<BR>    7. Structurally normal tricuspid valve with trace tricuspid<BR>    regurgitation.<BR>    8. Very small echo free space posterior to the right atrium LV<BR>    suggestive of small, hemodynamically insignificant pericardial<BR>     effusion.<BR>    9. Saline contrast bubble study performed which reveals a late<BR>    appearance of a small number of micro-bubbles on the left side.  No<BR>    definite transition across the interatrial septum noted and the late<BR>    appearance  is more suggestive of a pulmonary rale for right-to-left<BR> shunting.<BR>    10. Color flow and Doppler interrogation revealed normal aortic valve<BR>    flow parameters, mild mitral regurgitation, trace tricuspid<BR>     regurgitation.  Inadequate TR  jet to accurately estimate right side<BR>    pressures.<BR> <BR> IMPRESSION:<BR>    1. Normal LV size and function.<BR>    2. Mild mitral regurgitation.<BR>    3. Possible small, hemodynamically insignificant pericardial effusion as<BR>    described above.<BR>     4. Saline contrast bubble study shows late appearance of a small amount<BR>    of micro-bubbles as noted above, likely from pulmonary rale versus<BR> patent foramen ovale.<BR> <BR> <BR> <BR> <BR> <BR> E-Signed By<BR> Ellwood Sayers, M.D. 09/15/2007  13:38<BR> <BR>  Ellwood Sayers, M.D.<BR> <BR> cc:   Drue Stager, MD<BR>       Ellwood Sayers, M.D.<BR> <BR> <BR>       [facility]*<BR> <BR> JWH/AW; D: 09/11/2007 12:05 P; T: 09/11/2007  2:50 P; DOC# [id number]; Job#<BR> [id number]<WU> <!--EPICE-->

## 2007-09-11 NOTE — Procedures (Signed)
Name: Breanna Lester, Breanna Lester Admitted: 09/06/2007  MR #: [id number] DOB: 20-Mar-1961  Account #: [id number] Age 47  Ref MD: Location: [id number]     ECHOCARDIOGRAPHY REPORT    STUDY DATE: 09/11/2007    ORDERING PHYSICIAN: Drue Stager, M.D.    INDICATION: Pneumonia, Hematuria, abdominal pain.    FINDINGS:   1. Normal LV cavity size and wall thickness with normal LV systolic   function, ejection fraction estimated at 65-70%.   2. Normal right ventricular size and systolic function.   3. Normal atrial sizes.   4. Normal aortic root dimensions.   5. Structurally normal, trileaflet aortic valve without restriction or   regurgitation.   6. Structurally normal mitral valve with mild mitral regurgitation.   7. Structurally normal tricuspid valve with trace tricuspid   regurgitation.   8. Very small echo free space posterior to the right atrium LV   suggestive of small, hemodynamically insignificant pericardial   effusion.   9. Saline contrast bubble study performed which reveals a late   appearance of a small number of micro-bubbles on the left side. No   definite transition across the interatrial septum noted and the late   appearance is more suggestive of a pulmonary rale for right-to-left  shunting.   10. Color flow and Doppler interrogation revealed normal aortic valve   flow parameters, mild mitral regurgitation, trace tricuspid   regurgitation. Inadequate TR jet to accurately estimate right side   pressures.    IMPRESSION:   1. Normal LV size and function.   2. Mild mitral regurgitation.   3. Possible small, hemodynamically insignificant pericardial effusion as   described above.   4. Saline contrast bubble study shows late appearance of a small amount   of micro-bubbles as noted above, likely from pulmonary rale versus  patent foramen ovale.            E-Signed By  Ellwood Sayers, M.D. 09/15/2007 13:38    Ellwood Sayers, M.D.    cc: Drue Stager, MD   Ellwood Sayers, M.D.       [facility]*     JWH/AW; D: 09/11/2007 12:05 P; T: 09/11/2007 2:50 P; DOC# [id number]; Job#  [id number]

## 2007-09-12 LAB — CBC WITH AUTOMATED DIFF
ABS. BASOPHILS: 0 10*3/uL (ref 0.0–0.1)
ABS. EOSINOPHILS: 0.3 10*3/uL (ref 0.0–0.4)
ABS. LYMPHOCYTES: 3.2 10*3/uL (ref 0.8–3.5)
ABS. MONOCYTES: 1.1 10*3/uL — ABNORMAL HIGH (ref 0–1.0)
ABS. NEUTROPHILS: 8.9 10*3/uL — ABNORMAL HIGH (ref 1.8–8.0)
BASOPHILS: 0 % (ref 0–1)
EOSINOPHILS: 2 % (ref 0–7)
HCT: 26.4 % — ABNORMAL LOW (ref 35.0–47.0)
HGB: 7.9 g/dL — ABNORMAL LOW (ref 11.5–16.0)
LYMPHOCYTES: 24 % (ref 12–49)
MCH: 23.5 PG — ABNORMAL LOW (ref 26.0–34.0)
MCHC: 29.9 g/dL — ABNORMAL LOW (ref 30.0–35.0)
MCV: 78.6 FL — ABNORMAL LOW (ref 80.0–99.0)
MONOCYTES: 8 % (ref 5–13)
NEUTROPHILS: 66 % (ref 32–75)
PLATELET: 160 10*3/uL (ref 150–400)
RBC: 3.36 M/uL — ABNORMAL LOW (ref 3.80–5.20)
RDW: 23 % — ABNORMAL HIGH (ref 11.5–14.5)
WBC: 13.5 10*3/uL — ABNORMAL HIGH (ref 3.6–11.0)

## 2007-09-12 LAB — METABOLIC PANEL, COMPREHENSIVE
A-G Ratio: 0.5 — ABNORMAL LOW (ref 1.1–2.2)
ALT (SGPT): 28 U/L — ABNORMAL LOW (ref 30–65)
AST (SGOT): 36 U/L (ref 15–37)
Albumin: 2 g/dL — ABNORMAL LOW (ref 3.5–5.0)
Alk. phosphatase: 217 U/L — ABNORMAL HIGH (ref 50–136)
Anion gap: 8 (ref 5–15)
BUN/Creatinine ratio: 19 (ref 12–20)
BUN: 13 MG/DL (ref 6–20)
Bilirubin, total: 5 MG/DL — ABNORMAL HIGH (ref 0–1.0)
CO2: 30 MMOL/L (ref 21–32)
Calcium: 8.2 MG/DL — ABNORMAL LOW (ref 8.5–10.1)
Chloride: 98 MMOL/L (ref 97–108)
Creatinine: 0.7 MG/DL (ref 0.6–1.3)
GFR est AA: >60 mL/min/{1.73_m2} (ref >60)
GFR est non-AA: >60 mL/min/{1.73_m2} (ref >60)
Globulin: 4.4 g/dL — ABNORMAL HIGH (ref 2.0–4.0)
Glucose: 111 MG/DL — ABNORMAL HIGH (ref 65–105)
Potassium: 3.9 MMOL/L (ref 3.5–5.1)
Protein, total: 6.4 g/dL (ref 6.4–8.2)
Sodium: 136 MMOL/L (ref 136–145)

## 2007-09-12 LAB — PTT, ACTIVATED: aPTT: 35.3 s (ref 26.5–37.3)

## 2007-09-12 LAB — PROTHROMBIN TIME + INR
INR: 1.7 — ABNORMAL HIGH (ref 0.9–1.2)
Prothrombin time: 19.1 SECS — ABNORMAL HIGH (ref 9.5–13.1)

## 2007-09-12 LAB — TSH 3RD GENERATION: TSH: 0.01 u[IU]/mL — ABNORMAL LOW (ref 0.35–5.5)

## 2007-09-12 NOTE — Discharge Summary (Signed)
Name: Breanna Lester, Breanna Lester Admitted: 09/06/2007  MR #: [id number] Discharged:  Account #: [id number] DOB: 07-30-1961  Physician: Cyndie Chime, M.D. Age 47     DISCHARGE SUMMARY        ATTENDING PHYSICIAN: Cyndie Chime, MD.    PRIMARY CARE PHYSICIAN: Laurette Schimke. Genella Mech, MD.    GASTROENTEROLOGIST: Jonny Ruiz, MD.    SUMMARY OF ADMISSION HISTORY AND PHYSICAL: A 47 year old white female with  a history of significant weight loss of 70 or 80 pounds over the past year.  She has had a workup, which has included evaluation by [name], which  included a CT scan of her abdomen and upper and lower endoscopies, which by  report were negative except for an old healed ulcer in the stomach and a  colonic polyp. She has had on and off episodes of loose stool and  abdominal pain. She has had a poor appetite. She came to the Emergency  Room, who has thought she had blood in her urine. Her bilirubin was found  to be 6.5. Urinalysis had no blood, but 5 to 10 white blood cells. Chest  x-ray, bilateral opacities or infiltrates. Abdominal ultrasound had  gallbladder sludge. There is no intra or extrahepatic biliary ductal  dilatation. The patient was admitted for further evaluation.    SOCIAL HISTORY: She does smoke a pack a day. She is married. She lives  with her 2 daughters. She is unemployed.    PHYSICAL EXAMINATION: HEENT: Significant for icterus. ABDOMEN: Mildly  distended. There was tenderness over the epigastric area. Bowel sounds  are present.    LABORATORY DATA: SGOT was 87, SGPT 44, ammonia 34, and alkaline  phosphatase 221. Total bilirubin 6.5.    HOSPITAL COURSE: The patient was admitted with a diagnosis of acute  respiratory failure. She did drop her saturations down to approximately  90, and was treated for pneumonia with Levaquin. She was seen by GI  regarding the abnormal LFTs and weight loss. A HIDA scan was ordered. She  was noted to have an elevated PT/INR. Her albumin was decreased. She had   spider angiomas on examination. She was felt likely to have probable  cirrhosis as a cause of her abnormal LFTs. She was seen by Dr. Elayne Guerin, who felt the picture is most consistent with liver disease and not  cholecystitis. I did not recommend a laparoscopic cholecystectomy at this  time. She was continued on medical workup for underlying cirrhosis.  Autoimmune disease was ruled out. The HIDA scan had no clear evidence of  any biliary obstruction. She developed some abnormal electrolytes  including sodium of 133, potassium of 2.7. These were supplemented and  improved. She underwent a liver biopsy, which came back consistent with  steatohepatitis and she had no prior history of any drinking. There is no  evidence of any fluid on ultrasound for paracentesis. Serologies for  hepatitis A, B, and C were reportedly negative, her AFP was 2, her  ceruloplasmin was 24. ANA was none detected. Her ferritin was 56. Her  iron was 18. Her TIBC was 218, which has failed to exclude  hemochromatosis. She continued to have hypoxic respiratory failure. There  were some concerns about possible hepatopulmonary syndrome. An  echocardiogram was obtained with a questionable PFO. The case was  discussed with the Hepatology at [facility], it was decided given the patient's  persistent medical issues to transfer the patient to [facility] for tertiary  evaluation. The patient was transferred on 09/11/2006.  DISCHARGE DIAGNOSES:  1. Acute hypoxic respiratory failure, questionable pneumonia versus  hepatopulmonary syndrome.  2. 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  3. Pneumonia.  4. Anemia of chronic disease.  5. Patent foramen ovale.  6. Hypokalemia.  7. Hyponatremia.  8. Abnormal liver function tests.  9. Hyperbilirubinemia.  10. Coagulopathy.    DISCHARGE MEDICATIONS:  1. Albuterol nebulizer 2.5 mg every 4 hours.  2. Guaifenesin 600 mg by mouth 2 times a day.  3. Atrovent 0.5 mg inhalation every 4 hours.   4. Levaquin 750 mg by mouth daily.  5. Nicotine patch 21 mg per 24 hours every day.  6. Protonix 40 mg by mouth daily.  7. Zoloft 50 mg by mouth daily.    DISCHARGE CONDITION: Stable.    DISCHARGE DISPOSITION: Transfer to [facility].            E-Signed By  Cyndie Chime, M.D. 09/13/2007 08:07    Cyndie Chime, M.D.    cc: Cyndie Chime, M.D.        WC/FI2; D: 09/12/2007 1:08 P; T: 09/12/2007 3:41 P; DOC# [id number]; Job#  [id number]

## 2007-09-16 LAB — TYPE + CROSSMATCH
ABO/Rh(D): A POS
Antibody screen: NEGATIVE

## 2007-09-16 LAB — LIVER-KIDNEY MICROSOMAL-1, IGG: Liver-Kidney Microsome-1,IgG: 0.3 U

## 2007-11-20 LAB — CBC WITH AUTOMATED DIFF
ABS. BASOPHILS: 0 10*3/uL (ref 0.0–0.1)
ABS. EOSINOPHILS: 0.7 10*3/uL — ABNORMAL HIGH (ref 0.0–0.4)
ABS. LYMPHOCYTES: 2.4 10*3/uL (ref 0.8–3.5)
ABS. MONOCYTES: 0.3 10*3/uL (ref 0–1.0)
ABS. NEUTROPHILS: 13.5 10*3/uL — ABNORMAL HIGH (ref 1.8–8.0)
BAND NEUTROPHILS: 1 % (ref 0–6)
BASOPHILS: 0 % (ref 0–1)
EOSINOPHILS: 4 % (ref 0–7)
HCT: 25.6 % — ABNORMAL LOW (ref 35.0–47.0)
HGB: 8.4 g/dL — ABNORMAL LOW (ref 11.5–16.0)
LYMPHOCYTES: 14 % (ref 12–49)
MCH: 26.6 PG (ref 26.0–34.0)
MCHC: 32.8 g/dL (ref 30.0–35.0)
MCV: 81 FL (ref 80.0–99.0)
MONOCYTES: 2 % — ABNORMAL LOW (ref 5–13)
NEUTROPHILS: 79 % — ABNORMAL HIGH (ref 32–75)
PLATELET: 237 10*3/uL (ref 150–400)
RBC: 3.16 M/uL — ABNORMAL LOW (ref 3.80–5.20)
RDW: 19.8 % — ABNORMAL HIGH (ref 11.5–14.5)
WBC COMMENTS: REACTIVE
WBC: 16.9 10*3/uL — ABNORMAL HIGH (ref 3.6–11.0)

## 2007-11-20 LAB — AMMONIA: Ammonia, plasma: 45 umol/L — ABNORMAL HIGH (ref 11–32)

## 2007-11-20 LAB — METABOLIC PANEL, COMPREHENSIVE
A-G Ratio: 0.4 — ABNORMAL LOW (ref 1.1–2.2)
ALT (SGPT): 28 U/L — ABNORMAL LOW (ref 30–65)
AST (SGOT): 46 U/L — ABNORMAL HIGH (ref 15–37)
Albumin: 1.8 g/dL — ABNORMAL LOW (ref 3.5–5.0)
Alk. phosphatase: 322 U/L — ABNORMAL HIGH (ref 50–136)
Anion gap: 12 mmol/L (ref 5–15)
BUN/Creatinine ratio: 11 — ABNORMAL LOW (ref 12–20)
BUN: 19 MG/DL (ref 6–20)
Bilirubin, total: 4.7 MG/DL — ABNORMAL HIGH (ref <1.0)
CO2: 21 MMOL/L (ref 21–32)
Calcium: 8 MG/DL — ABNORMAL LOW (ref 8.5–10.1)
Chloride: 103 MMOL/L (ref 97–108)
Creatinine: 1.7 MG/DL — ABNORMAL HIGH (ref 0.6–1.3)
GFR est AA: 42 mL/min/{1.73_m2} — ABNORMAL LOW (ref >60)
GFR est non-AA: 34 mL/min/{1.73_m2} — ABNORMAL LOW (ref >60)
Globulin: 4.7 g/dL — ABNORMAL HIGH (ref 2.0–4.0)
Glucose: 128 MG/DL — ABNORMAL HIGH (ref 65–105)
Potassium: 3.5 MMOL/L (ref 3.5–5.1)
Protein, total: 6.5 g/dL (ref 6.4–8.2)
Sodium: 136 MMOL/L (ref 136–145)

## 2007-11-20 LAB — LIPASE: Lipase: 347 U/L — ABNORMAL HIGH (ref 114–286)

## 2007-11-20 LAB — LACTIC ACID: Lactic acid: 1.5 MMOL/L (ref 0.4–2.0)

## 2007-11-20 LAB — AMYLASE: Amylase: 56 U/L (ref 25–115)

## 2007-11-21 LAB — URINALYSIS W/ REFLEX CULTURE
Blood: NEGATIVE
Glucose: NEGATIVE MG/DL
Nitrites: POSITIVE — AB
Protein: 30 MG/DL — AB
Specific gravity: 1.022 (ref 1.003–1.030)
Urobilinogen: 1 EU/DL (ref 0.2–1.0)
pH (UA): 6 (ref 5.0–8.0)

## 2007-11-21 LAB — METABOLIC PANEL, COMPREHENSIVE
A-G Ratio: 0.4 — ABNORMAL LOW (ref 1.1–2.2)
ALT (SGPT): 25 U/L — ABNORMAL LOW (ref 30–65)
AST (SGOT): 41 U/L — ABNORMAL HIGH (ref 15–37)
Albumin: 1.6 g/dL — ABNORMAL LOW (ref 3.5–5.0)
Alk. phosphatase: 260 U/L — ABNORMAL HIGH (ref 50–136)
Anion gap: 10 mmol/L (ref 5–15)
BUN/Creatinine ratio: 12 (ref 12–20)
BUN: 21 MG/DL — ABNORMAL HIGH (ref 6–20)
Bilirubin, total: 3.8 MG/DL — ABNORMAL HIGH (ref <1.0)
CO2: 21 MMOL/L (ref 21–32)
Calcium: 7.7 MG/DL — ABNORMAL LOW (ref 8.5–10.1)
Chloride: 103 MMOL/L (ref 97–108)
Creatinine: 1.7 MG/DL — ABNORMAL HIGH (ref 0.6–1.3)
GFR est AA: 42 mL/min/{1.73_m2} — ABNORMAL LOW (ref >60)
GFR est non-AA: 34 mL/min/{1.73_m2} — ABNORMAL LOW (ref >60)
Globulin: 4 g/dL (ref 2.0–4.0)
Glucose: 140 MG/DL — ABNORMAL HIGH (ref 65–105)
Potassium: 3.3 MMOL/L — ABNORMAL LOW (ref 3.5–5.1)
Protein, total: 5.6 g/dL — ABNORMAL LOW (ref 6.4–8.2)
Sodium: 134 MMOL/L — ABNORMAL LOW (ref 136–145)

## 2007-11-21 LAB — CBC W/O DIFF
HCT: 21.9 % — ABNORMAL LOW (ref 35.0–47.0)
HGB: 7.3 g/dL — ABNORMAL LOW (ref 11.5–16.0)
MCH: 27 PG (ref 26.0–34.0)
MCHC: 33.3 g/dL (ref 30.0–35.0)
MCV: 81.1 FL (ref 80.0–99.0)
PLATELET: 181 10*3/uL (ref 150–400)
RBC: 2.7 M/uL — ABNORMAL LOW (ref 3.80–5.20)
RDW: 19.9 % — ABNORMAL HIGH (ref 11.5–14.5)
WBC: 14.5 10*3/uL — ABNORMAL HIGH (ref 3.6–11.0)

## 2007-11-21 LAB — CELL COUNT, BODY FLUID
FLD EOSINS: 1 %
FLD LYMPHS: 52 %
FLD MONO/MACROPHAGES: 1 %
FLD NEUTROPHILS: 11 %
FLUID MESOTHELIAL: 35 %
FLUID RBC CT.: >100 /mm3
FLUID WBC COUNT: 86 /mm3 — ABNORMAL HIGH (ref 0–5)

## 2007-11-21 LAB — BILIRUBIN, CONFIRM: Bilirubin UA, confirm: POSITIVE — AB

## 2007-11-21 LAB — CREATININE, UR, RANDOM: Creatinine, urine random: 338.3 MG/DL — ABNORMAL HIGH (ref 30–125)

## 2007-11-21 LAB — GRAM STAIN

## 2007-11-21 LAB — PROTHROMBIN TIME + INR
INR: 1.7 — ABNORMAL HIGH (ref 0.9–1.1)
Prothrombin time: 16.4 SECS — ABNORMAL HIGH (ref 9.0–11.0)

## 2007-11-21 LAB — LIPASE: Lipase: 288 U/L — ABNORMAL HIGH (ref 114–286)

## 2007-11-21 LAB — PTT, ACTIVATED: aPTT: 31.6 s (ref 24.0–33.0)

## 2007-11-21 LAB — SODIUM, UR, RANDOM: Sodium,urine random: 10 MMOL/L — ABNORMAL LOW (ref 20–110)

## 2007-11-21 NOTE — H&P (Signed)
Name: Breanna Lester, Breanna Lester Admitted: 11/20/2007  MR #: [id number] DOB: October 08, 1960  Account #: [id number] Age: 47  Physician: Shireen Quan, M.D. Location:     HISTORY PHYSICAL      CHIEF COMPLAINT: Abdominal swelling.    HISTORY OF PRESENT ILLNESS: This 47 year old white female with cirrhosis  is admitted due to tense ascites which for the last week has caused  shortness of breath and early satiety.    The patient had been first diagnosed with cirrhosis in January, 2008. At  that time, she had actually been admitted for pneumonia, and while in the  hospital, the diagnosis of cirrhosis had been made. An extensive workup for  her cirrhosis had been performed at that time which was negative. The  patient had subsequently undergone liver biopsy which showed  steatohepatitis. Because of continuing hypoxemia, which was thought  possibly due to hepato-pulmonary syndrome, the patient had been transferred  to [facility] where it was felt  that her hypoxemia was simply related to her pneumonia. The patient notes  that when she was discharged from the [facility] in January that  she was not started on any diuretic therapy.    The patient's ascites had gradually been worsening since discharge from the  [facility], and she started to pay attention to it 1 week ago when it began  causing difficulty with shortness of breath and early satiety. The  patient's symptoms have continued to worsen which prompted her to present  to the Emergency Department on the night of November 20, 2007. A 2-liter  paracentesis was performed in the Emergency Department which improved her  feeling of shortness of breath, but her abdomen remained quite tense with  ascites. Because of the continued elevated intra-abdominal pressure, the  patient now has continuous drainage of ascites from her abdomen through the  paracentesis site and has required ostomy bag to be placed over it.     As with her previous admission in January, the patient's white count again  is elevated, this time at 16.9. The patient has had no clear-cut source of  infection and has had no fevers or chills. She has had no productive  cough. There has been no dysuria. She was empirically given a dose of  Zosyn for the possibility of spontaneous bacterial peritonitis.    The patient's abdominal films suggested colitis of the left colon, so the  patient was also given Flagyl. She is now admitted for further evaluation  and treatment.    PAST MEDICAL HISTORY: Includes:  1. Cirrhosis of unclear cause.  2. History of coagulopathy.  3. History of anemia.  4. History chronic leukocytosis.  5. Irritable bowel syndrome.  6. Anxiety and depression.  7. Gastroesophageal reflux disease.  8. Degenerative joint disease.  9. Anxiety and depression.  10. Irritable bowel syndrome.  11. Status post colonoscopy in 2008, which revealed 1 polyp as well as  internal hemorrhoids.  12. Status post EGD, which showed an old, old ulcer.  13. Pneumonia in January 2009.    MEDICATIONS. Prior to admission, Zoloft 50 mg daily.    Other medications at discharge from [facility] in  January had included:  1. Zoloft 50mg  daily.  2. Percocet.  3. Potassium chloride.  4. Enulose.  The patient states that she has not been on any diuretic medications.    ALLERGIES: None known.    SOCIAL HISTORY: Married. She smokes 1 cigarette per day. She does not  use alcohol.    FAMILY HISTORY: Her mother had breast and uterine cancer. She has a  sister with Crohn disease.    REVIEW OF SYSTEMS: Constitutional: No fevers or chills. Eyes: No  blurred vision. Ears, nose, mouth, and throat: No sinus drainage.  Cardiac: No midsternal chest heaviness. Positive for lower extremity  edema. Respiratory: Positive for shortness of breath especially when the  patient is in the supine position. GI: Positive for tense ascites but no   nausea, vomiting, or diaphoresis. GU: No dysuria. Musculoskeletal:  Positive for chronic hip and shoulder pains. Neuro: No headaches or loss  of consciousness. Skin: No rashes or lesions. All other systems  negative.    PHYSICAL EXAMINATION:  GENERAL: Reveals a jaundiced white female who appears quite thin but who  is in no acute distress. There is no accessory muscle use. She is not  anxious or agitated.  VITAL SIGNS: At presentation included temperature 97.4, pulse 101,  respirations 20, and blood pressure 113/59. Oxygen saturation 100% on room  air.  HEENT: Head normocephalic, atraumatic. Pupils are equal, round, reactive  to light. Extraocular muscles are intact. No conjunctival drainage.  Positive for scleral icterus. No external lesions on the nose or ears.  Oral mucosa is moist.  NECK: Supple. Trachea midline. No masses or palpable lymph nodes.  Carotids are 2+ bilaterally.  HEART: Regular in rate and rhythm without murmurs, gallops, or rubs.  LUNGS: Revealed decreased breath sounds at the bases.  ABDOMEN: Tightly distended with ascites. Bowel sounds present but  diminished. It was not possible to detect any masses or organomegaly.  EXTREMITIES: Revealed 2+ pretibial and pedal edema bilaterally. No  cyanosis in the feet or toes.  SKIN: Notable for a lesion across the front of her right shin, which  appears to be a healing scrape. Positive also for spider angiomata.  Neurologic: Cranial nerves 2 through 12 intact. Motor examination  nonfocal.    DIAGNOSTIC DATA: Laboratory data showed a white count 16.9, hemoglobin  8.4, hematocrit 25.6, and platelets 237,000. Segs 79, bands 1, lymphocytes  14, monos 2, and eos 4. Sodium 136, potassium 3.5, chloride 103,  bicarbonate 21, glucose 128, BUN 19, and creatinine 1.7. Calcium 8.0 and  albumin 1.8. Total bilirubin 4.7, ALT 28, AST 46, and alkaline phosphatase  322. Amylase 56 with a lipase of 347. Ammonia 45. Lactic acid 1.5. Urine   showed 5 to 10 white cells, 5 to 10 epithelial cells, 2+ bacteria, and  leukocyte esterase small. Urine specific gravity 1.022. Protein 30.    Chest x-ray showed no acute cardiopulmonary disease. Abdominal films  showed an abnormal appearance to the left colon suggestive of colitis.    ASSESSMENT AND PLAN:  1. Tense ascites. The patient appears to need a large volume  paracentesis. In addition, I will order a low-sodium diet. Spironolactone  should be initiated either at discharge or just before discharge to try to  prevent the recurrence of tense ascites.  2. Leaking ascites from today's paracentesis site. The drainage should  resolve after the large volume paracentesis decreases the intra-abdominal  pressure. In the meantime, the patient will be continued with an ostomy  bag in place in order to keep the rest of her skin dry and to be able to  quantitate fluid losses.  3. Cirrhosis. The patient's ammonia level was elevated at 45. I will  continue her lactulose but increase the frequency to twice daily.  4. Coagulopathy. At her last admission, her INR typically  ran in the  1.7 range. I will check her PT and INR with the morning labs.  5. Anemia. The patient's hemoglobin and hematocrit are low but stable  compared to values from January. I will repeat the hemoglobin and  hematocrit in the morning. As long as they remain stable, it does not  appear that the patient will need to be transfused.  6. Leukocytosis. This is a chronic finding. Her chest x-ray is clear.  Her urine analysis is equivocal with as many epithelial cells as there are  white blood cells. We will see what the urine culture shows. The ascites  Gram stain is negative. We will need to follow up on the culture of the  ascites. In the meantime, I will continue Zosyn and Flagyl as were  initiated in the Emergency Department while waiting for those culture  results and for Dr. Lennie Hummer input on the possibility of colitis.   7. Colitis. The patient has had a previous colonoscopy by [name].  We will see what he thinks about the radiologist interpretation of the  abdominal films possibly showing colitis.  8. Anxiety and depression. I will continue the patient's usual  Zoloft.  9. Acute renal failure with a creatinine of 1.7, up from 0.7 in January  2009. The urine specific gravity is elevated at 1.022, suggesting a  prerenal pattern, which is typical of cirrhosis. The patient has been on  no nephrotoxic drugs. Her outlook in the long term is very poor.  10. Gastroesophageal reflux disease. I will order a proton pump  inhibitor.  11. Degenerative joint disease. I have ordered as-needed Tylenol.    The total time here was 80 minutes.            E-Signed By  Shireen Quan, M.D. 11/21/2007 02:46    Shireen Quan, M.D.    cc: Doree Fudge, M.D.   [name], MD   Shireen Quan, M.D.        RT/FI2; D: 11/21/2007 12:24 A; T: 11/21/2007 12:46 A; DOC# [id number]; Job#  [id number]

## 2007-11-21 NOTE — Consults (Signed)
Name: Breanna Lester, Breanna Lester Admitted: 11/20/2007  MR #: [id number] DOB: 1961-07-19  Account #: [id number] Age: 47  Consultant: [name], MD Location: [phone]     CONSULTATION REPORT    DATE OF CONSULTATION: 11/21/2007  REFERRING PHYSICIAN: Drue Stager, MD        CHIEF COMPLAINT: Asked to see by Dr. Rosalee Kaufman for number of issues.  These include ascites, chronic liver disease, and abnormal radiographic  examination GI tract.    HISTORY OF PRESENT ILLNESS: The history is obtained from the patient and  supplemented from the medical record. I saw the patient initially  approximately 2 years ago for abdominal pain and diarrhea. She had been  seen by Dr. Patrica Duel in the past. Colonoscopy with biopsy was  negative.    She was admitted to [facility] on September 06, 2007  with jaundice, elevated liver tests, abdominal pain. We performed an  extensive evaluation, which included evaluation for autoimmune hepatitis,  discovery of mild coagulopathy with INR of 1.7, testing for hepatitis A, B,  and C all of which was negative. AMA was negative. Antinuclear antibody  was negative. Antimitochondrial antibody was negative. Protein  electrophoresis of the serum showed a normal gamma globulin. Albumin was  low. CA-19-9 and ceruloplasmin were normal. Present saturation of iron  and ferritin showed possible iron deficiency anemia and chronic disease  with iron 18, TIBC 218, and ferritin 56. The present saturation is still  low at that level (September 07, 2007).    We proceeded with a liver biopsy on September 09, 2007. This showed  hepatitis with moderate activity, an incomplete cirrhosis (stage III to IV)  favor nonalcoholic steatohepatitis.    She had significant respiratory issues that admission. We were concerned  about hepatopulmonary syndrome. A cardiology evaluation showed a late  appearance of a small number of microbubbles on the left and no definite   transition. Delayed appearance was more suggestive of a pulmonary cause of  right to left shunt. The patent foramen ovale was felt to be possible.    She was transferred as an inpatient to [facility]. Her respiratory  status improved. She was scheduled for a followup there and did not  complete followup with me or with them.    She was readmitted yesterday with chest pain in the center of her chest and  epigastrium, shortness of breath, worsening abdominal fluid, and abdominal  pain as if it is felt like "she was going to burst." There were some  chills. There were no diarrhea. There were no fevers.    PAST MEDICAL SURGICAL HISTORY: Hysterectomy, C-section x2, she was on  Celebrex for arthritis in the fall, arthritis of the hips.    FAMILY HISTORY: Positive for inflammatory bowel disease in a sister,  diabetes mellitus, and heart disease.    ALLERGIES: None known.    SOCIAL HISTORY: She is a former accounts Producer, television/film/video. She is currently unemployed.    MEDICATIONS: Currently Flagyl, lactulose, Zoloft, Zofran, Protonix,  Tylenol at a low dose, and intravenous saline lock.    REVIEW OF SYSTEMS: As obtained in detail. The positives are chills, night  driving problems, chest pain as noted above, shortness of breath as noted  above, occasional blood in the stool, but no diarrhea, no abdominal pain.  Her last colonoscopy was approximately a year-and-a-half ago.  GENITOURINARY: Negative. MUSCULOSKELETAL: See above. ENDOCRINE:  Negative. NEUROLOGIC: She has a tremor. PSYCHIATRIC: Anxiety.  PHYSICAL EXAMINATION:  VITAL SIGNS: Temperature 98 degrees Fahrenheit, blood pressure 114/66,  pulse 93, respirations 20.  GENERAL: Jaundiced, appears chronically ill, appears older than stated  age.  EYES: No scleral icterus is present. Extraocular motions intact.  NEUROLOGIC: Alert, oriented x4, asterixis is present.  ENMT: Lips, teeth, gums, oropharynx unremarkable.   CHEST: Clear to auscultation. No use of accessory muscles.  HEART: Regular rate and rhythm, loud 2/6 systolic murmur, upper sternal  border on the right side.  ABDOMEN: Ascites is present. The liver is palpable 4 cm below the right  costal margin. The spleen is ?palpable. Bowel sounds are present.  EXTREMITIES: 1+ pretibial edema bilaterally.  LYMPHATIC: No anterior, posterior, cervical, supraclavicular, or inguinal  lymphadenopathy.  SKIN: Ecchymoses are present. No nodule.  RECTAL: Deferred. I will perform this tomorrow at colonoscopy.    DIAGNOSTIC DATA: Her laboratories reviewed. White blood cell count is  elevated at 14.5; hemoglobin 7.3, which is a chronic; her platelet count is  normal at 181. PT/INR 16.4/1.7. Sodium 134, potassium 3.3, glucose 140,  BUN 21, creatinine 1.7. This is new. Calcium 7.7, total protein 5.6,  albumin 1.6, total bilirubin 3.8, somewhat lower than earlier in the  winter, alkaline phosphatase 260, SGOT 41, SGPT 25, lipase 288. I reviewed  her plain film, which shows definite bowel wall mucosal edema, but also  bowel wall thickening and outlining of the mucosa of the descending colon  on plain film.    IMPRESSION REPORT AND PLAN:  1. Cirrhosis, cryptogenic, probably nonalcoholic fatty liver disease.  2. Ascites, status post paracentesis. I note that paracentesis was  done yesterday and today. Laboratory results from the body fluid analysis  show 86 nuclear cells, 52% lymphocytes. Therefore, at least preliminarily  _____ is absent.  3. Encephalopathy, manifested by asterixis.  4. Colitis by plain film.  5. Hematochezia.  6. No known true dose drug sensitivity.  7. Elevated creatinine.    RECOMMENDATIONS:  1. I agree with paracentesis.  2. We will try to put on an Aldactone, Lasix regimen when her  creatinine will tolerate it. We will hold off now because she may get  volume depleted from the paracentesis.  3. I think a colonoscopy is reasonable. The bowel wall findings are   definitely abnormal. She has had some hematochezia. I will do this  tomorrow if she is medically stable. I have discussed with the patient and  her husband the objectives, risks, consequences and alternatives,  colonoscopy with possible biopsy.  4. It is very important that she keep her followup appointments both  with me and with [facility]. She does not require a liver  transplant at this time; however, she had _____ that would change  immediately and it will be important to stay in touch with the transplant  center.  5. I have discussed all the above in detail with the patient and her  husband.  6. I thank Dr. Rosalee Kaufman for this interesting consultation.          E-Signed By  [name], MD 11/25/2007 17:25    [name], MD    cc: Emelia Salisbury, MD   Drue Stager, MD   [name], MD   Elayne Guerin, M.D.      COPY TO: Hepatology Clinic, [facility].      RFK/FI2; D: 11/21/2007 1:35 P; T: 11/21/2007 1:51 P; DOC# [id number]; Job#  [id number]

## 2007-11-22 LAB — CBC WITH AUTOMATED DIFF
ABS. BASOPHILS: 0.1 10*3/uL (ref 0.0–0.1)
ABS. EOSINOPHILS: 0.7 10*3/uL — ABNORMAL HIGH (ref 0.0–0.4)
ABS. LYMPHOCYTES: 2.9 10*3/uL (ref 0.8–3.5)
ABS. MONOCYTES: 1.3 10*3/uL — ABNORMAL HIGH (ref 0–1.0)
ABS. NEUTROPHILS: 9.4 10*3/uL — ABNORMAL HIGH (ref 1.8–8.0)
BASOPHILS: 1 % (ref 0–1)
EOSINOPHILS: 5 % (ref 0–7)
HCT: 20.8 % — ABNORMAL LOW (ref 35.0–47.0)
HGB: 6.8 g/dL — CL (ref 11.5–16.0)
LYMPHOCYTES: 20 % (ref 12–49)
MCH: 26.6 PG (ref 26.0–34.0)
MCHC: 32.7 g/dL (ref 30.0–35.0)
MCV: 81.3 FL (ref 80.0–99.0)
MONOCYTES: 9 % (ref 5–13)
NEUTROPHILS: 65 % (ref 32–75)
PLATELET: 176 10*3/uL (ref 150–400)
RBC: 2.56 M/uL — ABNORMAL LOW (ref 3.80–5.20)
RDW: 19.8 % — ABNORMAL HIGH (ref 11.5–14.5)
WBC COMMENTS: REACTIVE
WBC: 14.4 10*3/uL — ABNORMAL HIGH (ref 3.6–11.0)

## 2007-11-22 LAB — METABOLIC PANEL, COMPREHENSIVE
A-G Ratio: 0.4 — ABNORMAL LOW (ref 1.1–2.2)
ALT (SGPT): 26 U/L — ABNORMAL LOW (ref 30–65)
AST (SGOT): 33 U/L (ref 15–37)
Albumin: 1.5 g/dL — ABNORMAL LOW (ref 3.5–5.0)
Alk. phosphatase: 258 U/L — ABNORMAL HIGH (ref 50–136)
Anion gap: 12 mmol/L (ref 5–15)
BUN/Creatinine ratio: 12 (ref 12–20)
BUN: 22 MG/DL — ABNORMAL HIGH (ref 6–20)
Bilirubin, total: 3.6 MG/DL — ABNORMAL HIGH (ref <1.0)
CO2: 22 MMOL/L (ref 21–32)
Calcium: 7.4 MG/DL — ABNORMAL LOW (ref 8.5–10.1)
Chloride: 102 MMOL/L (ref 97–108)
Creatinine: 1.8 MG/DL — ABNORMAL HIGH (ref 0.6–1.3)
GFR est AA: 39 mL/min/{1.73_m2} — ABNORMAL LOW (ref >60)
GFR est non-AA: 32 mL/min/{1.73_m2} — ABNORMAL LOW (ref >60)
Globulin: 4 g/dL (ref 2.0–4.0)
Glucose: 145 MG/DL — ABNORMAL HIGH (ref 65–105)
Potassium: 3.4 MMOL/L — ABNORMAL LOW (ref 3.5–5.1)
Protein, total: 5.5 g/dL — ABNORMAL LOW (ref 6.4–8.2)
Sodium: 136 MMOL/L (ref 136–145)

## 2007-11-22 LAB — CULTURE, URINE
Colonies Counted: <1000
Colony Count: <1000
Culture result:: NO GROWTH
Culture: NO GROWTH

## 2007-11-22 LAB — PTT, ACTIVATED: aPTT: 31.4 s (ref 24.0–33.0)

## 2007-11-22 LAB — PROTHROMBIN TIME + INR
INR: 1.8 — ABNORMAL HIGH (ref 0.9–1.1)
Prothrombin time: 16.9 SECS — ABNORMAL HIGH (ref 9.0–11.0)

## 2007-11-22 NOTE — Op Note (Signed)
Op Notes signed by  at 11/25/07 1725                  Author: Kristine Linea, MD  Service: --  Author Type: Physician            Filed:   Date of Service: 11/22/07 1335  Status: Signed          <!--EPICS--> Name:      Breanna Lester, Breanna Lester<BR> MR #:      [id number]                    Surgeon:        [name], MD<BR> Account #: [id number]                  Surgery Date:<BR> DOB:       07-09-1961<BR> Age:       47                           Location:       [id number]<BR> <BR>                              OPERATIVE REPORT<BR> <BR> <BR> <BR> PROCEDURE PLANNED:  Flexible sigmoidoscopy.<BR> <BR>  PROCEDURE PERFORMED:  Flexible sigmoidoscopy with biopsy.<BR> <BR> INDICATION:<BR> 1.     Colitis by plain film.<BR> 2.     Diarrhea.<BR> 3.     Rectal bleeding.<BR> <BR> POSTPROCEDURE DIAGNOSES:<BR> 1.     Boggy, edematous, but otherwise normal appearing  mucosa to 40 cm.<BR> 2.     Abnormal rectal examination with scarring posteriorly, possible anal<BR> fissure and external hemorrhoids.<BR> <BR> PREPROCEDURE MEDICATIONS:  None.<BR> <BR> ASSISTANT:  Charlotte Timberlake-Torseth.<BR> <BR> DESCRIPTION OF  PROCEDURE:  Prior to the _____, the subjective, risks,<BR> consequences, and alternatives were discussed with the patient, who then<BR> elected to proceed.  Digital rectal examination revealed some scarring<BR> anteriorly.  The Olympus video colonoscope  was inserted in the rectum and<BR> advanced to about 40 cm and what appeared to be the mid sigmoid colon or<BR> proximal sigmoid colon.  It was uncomfortable _____ attempting to proceed<BR> higher.   The mucosa was pale and boggy, edematous but there  was no colitis<BR> and no mucosal abnormalities.  I took photographs.  Biopsies were obtained.<BR> I did not retroflex in the rectum because of a small rectal vault.  Upon<BR> removal of the scope, a probable chronic fissure was seen posteriorly and<BR>  some external hemorrhoids, which likely have recently bled were seen.<BR> There  were no apparent complications, and the patient tolerated the<BR> procedure well.<BR> <BR> <BR> <BR> <BR> <BR> <BR> <BR> E-Signed By<BR> [name], MD 11/25/2007 17:25<BR>  [name], MD<BR> <BR> cc:   Emelia Salisbury, MD<BR>       Drue Stager, MD<BR>       [name], MD<BR> <BR> <BR> <BR> RFK/FI2; D: 11/22/2007  1:18 P; T: 11/22/2007  1:35 P; Doc# [id number]; Job#<BR> [id number]<EP> <!--EPICE-->

## 2007-11-22 NOTE — Op Note (Signed)
Name: Breanna Lester, Breanna Lester  MR #: [id number] Surgeon: [name], MD  Account #: [id number] Surgery Date:  DOB: 04/04/1961  Age: 47 Location: [id number]     OPERATIVE REPORT        PROCEDURE PLANNED: Flexible sigmoidoscopy.    PROCEDURE PERFORMED: Flexible sigmoidoscopy with biopsy.    INDICATION:  1. Colitis by plain film.  2. Diarrhea.  3. Rectal bleeding.    POSTPROCEDURE DIAGNOSES:  1. Boggy, edematous, but otherwise normal appearing mucosa to 40 cm.  2. Abnormal rectal examination with scarring posteriorly, possible anal  fissure and external hemorrhoids.    PREPROCEDURE MEDICATIONS: None.    ASSISTANT: Irena Cords.    DESCRIPTION OF PROCEDURE: Prior to the _____, the subjective, risks,  consequences, and alternatives were discussed with the patient, who then  elected to proceed. Digital rectal examination revealed some scarring  anteriorly. The Olympus video colonoscope was inserted in the rectum and  advanced to about 40 cm and what appeared to be the mid sigmoid colon or  proximal sigmoid colon. It was uncomfortable _____ attempting to proceed  higher. The mucosa was pale and boggy, edematous but there was no colitis  and no mucosal abnormalities. I took photographs. Biopsies were obtained.  I did not retroflex in the rectum because of a small rectal vault. Upon  removal of the scope, a probable chronic fissure was seen posteriorly and  some external hemorrhoids, which likely have recently bled were seen.  There were no apparent complications, and the patient tolerated the  procedure well.                E-Signed By  [name], MD 11/25/2007 17:25  [name], MD    cc: Emelia Salisbury, MD   Drue Stager, MD   [name], MD        RFK/FI2; D: 11/22/2007 1:18 P; T: 11/22/2007 1:35 P; Doc# [id number]; Job#  [id number]

## 2007-11-23 LAB — METABOLIC PANEL, COMPREHENSIVE
A-G Ratio: 0.4 — ABNORMAL LOW (ref 1.1–2.2)
ALT (SGPT): 28 U/L — ABNORMAL LOW (ref 30–65)
AST (SGOT): 37 U/L (ref 15–37)
Albumin: 1.7 g/dL — ABNORMAL LOW (ref 3.5–5.0)
Alk. phosphatase: 292 U/L — ABNORMAL HIGH (ref 50–136)
Anion gap: 12 mmol/L (ref 5–15)
BUN/Creatinine ratio: 17 (ref 12–20)
BUN: 22 MG/DL — ABNORMAL HIGH (ref 6–20)
Bilirubin, total: 5 MG/DL — ABNORMAL HIGH (ref <1.0)
CO2: 19 MMOL/L — CL (ref 21–32)
Calcium: 7.6 MG/DL — ABNORMAL LOW (ref 8.5–10.1)
Chloride: 103 MMOL/L (ref 97–108)
Creatinine: 1.3 MG/DL (ref 0.6–1.3)
GFR est AA: 57 mL/min/{1.73_m2} — ABNORMAL LOW (ref >60)
GFR est non-AA: 47 mL/min/{1.73_m2} — ABNORMAL LOW (ref >60)
Globulin: 4.3 g/dL — ABNORMAL HIGH (ref 2.0–4.0)
Glucose: 137 MG/DL — ABNORMAL HIGH (ref 65–105)
Potassium: 3.4 MMOL/L — ABNORMAL LOW (ref 3.5–5.1)
Protein, total: 6 g/dL — ABNORMAL LOW (ref 6.4–8.2)
Sodium: 134 MMOL/L — ABNORMAL LOW (ref 136–145)

## 2007-11-23 LAB — CBC W/O DIFF
HCT: 27.7 % — ABNORMAL LOW (ref 35.0–47.0)
HGB: 9.2 g/dL — ABNORMAL LOW (ref 11.5–16.0)
MCH: 26.9 PG (ref 26.0–34.0)
MCHC: 33.2 g/dL (ref 30.0–35.0)
MCV: 81 FL (ref 80.0–99.0)
PLATELET: 191 10*3/uL (ref 150–400)
RBC: 3.42 M/uL — ABNORMAL LOW (ref 3.80–5.20)
RDW: 18.6 % — ABNORMAL HIGH (ref 11.5–14.5)
WBC: 15.7 10*3/uL — ABNORMAL HIGH (ref 3.6–11.0)

## 2007-11-23 LAB — TYPE & SCREEN
ABO/Rh(D): A POS
Antibody screen: NEGATIVE
Status of unit: TRANSFUSED
Status of unit: TRANSFUSED

## 2007-11-23 LAB — PROTHROMBIN TIME + INR
INR: 1.8 — ABNORMAL HIGH (ref 0.9–1.1)
Prothrombin time: 16.6 SECS — ABNORMAL HIGH (ref 9.0–11.0)

## 2007-11-23 LAB — TYPE AND SCREEN
ABO/Rh: A POS
Antibody Screen: NEGATIVE
Status: TRANSFUSED
Status: TRANSFUSED

## 2007-11-23 NOTE — Discharge Summary (Signed)
Name: Breanna Lester, Breanna Lester Admitted: 11/20/2007  MR #: [id number] Discharged: 11/23/2007  Account #: [id number] DOB: 20-Jul-1961  Physician: Drue Stager, MD Age 47     DISCHARGE SUMMARY        HISTORY: This is a 47 year old female with multiple medical problems  including cryptogenic cirrhosis, portal hypertension, ascites,  hyperbilirubinemia, elevated liver function, and some history of tobacco  abuse, history of pneumonia, history of pulmonary-hepatic shunt, who  presents with acute on chronic ascites, portal hypertension. She was ruled  out for a spontaneous retroperitonitis and was empirically covered with  Zosyn and Levaquin. She had persistent cryptogenic cirrhosis, anemia,  which is chronic, and coagulopathy.    HOSPITAL COURSE: During the hospitalization, the patient underwent large  volume paracentesis; 2 liters of fluid were drawn off. She had no evidence  of spontaneous bacterial peritonitis. Her antibiotics were discontinued.  Her cryptogenic cirrhosis was evaluated per [name]. In addition,  the patient developed chronic blood loss anemia. She was mildly  coagulopathic. Her hemoglobin dropped. She received 2 units packed red  cells and underwent a flexible sigmoidoscopy with biopsy. Her pathology  results are still pending. She had persistent coagulopathy, which was  secondary to her liver disease and some borderline hypotension. She also  presented initially with acute renal failure on chronic kidney disease.  With hydration, this clinically improved. Her hypokalemia was repleted.  She was then sent out on Lasix, and she was strongly encouraged to follow  up with [facility] for a liver transplant, as her MELD score was  clearly elevated.    CONSULTANTS: GI, [name], Dr. Teressa Lower.    DISCHARGE DIAGNOSES:  1. Ascites.  2. Portal hypertension.  3. Cryptogenic cirrhosis.  4. Acute renal failure on chronic kidney disease, stage III.  5. Anemia.  6. Chronic blood loss.  7. Coagulopathy.   8. Hypotension.  9. Hypokalemia.  10. Hyponatremia.  11. Hyperbilirubinemia.    DISCHARGE MEDICATIONS: Include the following:  1. Lasix 40 mg 1 by mouth daily.  2. Lactulose 10 g 1 by mouth 2 times a day.  3. Prilosec over-the-counter or Pepcid 1 by mouth daily.  4. Zoloft 50 mg by mouth daily.  5. Oxycodone 5 mg by mouth every 4 hours as needed for pain.    DIET: She should follow low-fat diet and fluid restrict, only 5 to 6  glasses of fluid every 24 hours.    ACTIVITY: No other physical activity restrictions.    FOLLOWUP: She needs to follow up with [facility] with  either Dr. Kandis Mannan, Dr. Rose Fillers, or Dr. Jennye Moccasin.    DISCHARGE INSTRUCTIONS: She needs a followup outpatient renal panel or  kidney panel within 1 to 2 weeks to check on her creatinine and her sodium,  and I have written for 10 tablets of oxycodone as needed for pain, namely  from the large volume paracentesis.    FOLLOWUP: The patient needs to follow with [facility] within 2 weeks to 4 weeks as well as [facility], main  #[id number].            E-Signed By  Drue Stager, MD 12/26/2007 08:46    Drue Stager, MD    cc: Drue Stager, MD        GG/FI2; D: 11/23/2007 7:25 P; T: 11/23/2007 7:49 P; DOC# [id number]; Job#  [id number]

## 2007-11-24 LAB — CULTURE, BODY FLUID: Culture result:: NO GROWTH

## 2007-11-26 LAB — CULTURE, BLOOD
Culture result:: NO GROWTH
Report Status: 4072009

## 2007-12-28 LAB — METABOLIC PANEL, COMPREHENSIVE
A-G Ratio: 0.3 — ABNORMAL LOW (ref 1.1–2.2)
ALT (SGPT): 31 U/L (ref 30–65)
AST (SGOT): 58 U/L — ABNORMAL HIGH (ref 15–37)
Albumin: 1.7 g/dL — ABNORMAL LOW (ref 3.5–5.0)
Alk. phosphatase: 240 U/L — ABNORMAL HIGH (ref 50–136)
Anion gap: 13 mmol/L (ref 5–15)
BUN/Creatinine ratio: 13 (ref 12–20)
BUN: 30 MG/DL — ABNORMAL HIGH (ref 6–20)
Bilirubin, total: 6.1 MG/DL — ABNORMAL HIGH (ref <1.0)
CO2: 22 MMOL/L (ref 21–32)
Calcium: 8.4 MG/DL — ABNORMAL LOW (ref 8.5–10.1)
Chloride: 105 MMOL/L (ref 97–108)
Creatinine: 2.4 MG/DL — ABNORMAL HIGH (ref 0.6–1.3)
GFR est AA: 28 mL/min/{1.73_m2} — ABNORMAL LOW (ref >60)
GFR est non-AA: 23 mL/min/{1.73_m2} — ABNORMAL LOW (ref >60)
Globulin: 4.9 g/dL — ABNORMAL HIGH (ref 2.0–4.0)
Glucose: 128 MG/DL — ABNORMAL HIGH (ref 50–100)
Potassium: 3 MMOL/L — ABNORMAL LOW (ref 3.5–5.1)
Protein, total: 6.6 g/dL (ref 6.4–8.2)
Sodium: 140 MMOL/L (ref 136–145)

## 2007-12-28 LAB — CBC WITH AUTOMATED DIFF
ABS. BASOPHILS: 0.2 10*3/uL — ABNORMAL HIGH (ref 0.0–0.1)
ABS. EOSINOPHILS: 0.5 10*3/uL — ABNORMAL HIGH (ref 0.0–0.4)
ABS. LYMPHOCYTES: 2.4 10*3/uL (ref 0.8–3.5)
ABS. MONOCYTES: 1.4 10*3/uL — ABNORMAL HIGH (ref 0–1.0)
ABS. NEUTROPHILS: 12.6 10*3/uL — ABNORMAL HIGH (ref 1.8–8.0)
BASOPHILS: 1 % (ref 0–1)
EOSINOPHILS: 3 % (ref 0–7)
HCT: 24.2 % — ABNORMAL LOW (ref 35.0–47.0)
HGB: 8.3 g/dL — ABNORMAL LOW (ref 11.5–16.0)
LYMPHOCYTES: 14 % (ref 12–49)
MCH: 29.3 PG (ref 26.0–34.0)
MCHC: 34.3 g/dL (ref 30.0–35.0)
MCV: 85.5 FL (ref 80.0–99.0)
MONOCYTES: 8 % (ref 5–13)
NEUTROPHILS: 74 % (ref 32–75)
PLATELET: 148 10*3/uL — ABNORMAL LOW (ref 150–400)
RBC: 2.83 M/uL — ABNORMAL LOW (ref 3.80–5.20)
RDW: 22 % — ABNORMAL HIGH (ref 11.5–14.5)
WBC: 17.1 10*3/uL — ABNORMAL HIGH (ref 3.6–11.0)

## 2007-12-28 LAB — PTT, ACTIVATED: aPTT: 35 s — ABNORMAL HIGH (ref 24.0–33.0)

## 2007-12-28 LAB — PROTHROMBIN TIME + INR
INR: 2 — ABNORMAL HIGH (ref 0.9–1.1)
Prothrombin time: 18.7 SECS — ABNORMAL HIGH (ref 9.0–11.0)

## 2007-12-28 NOTE — H&P (Signed)
Name: Breanna Lester, HULICK Admitted: 12/28/2007  MR #: [id number] DOB: 09-23-1960  Account #: [id number] Age: 47  Physician: Drue Stager, MD Location: [id number]     HISTORY PHYSICAL        ADMISSION START TIME: 8 p.m.    HISTORY OF PRESENT ILLNESS: This is a 47 year old female who has  unfortunately been admitted multiple times. She has a history of  cryptogenic cirrhosis. She has had a thorough workup including EGD,  colonoscopy, I believe, liver biopsy per [name] in the past. She has  also had a history of coagulopathy, anemia of chronic illness as well as  portal hypertension. There was a thought that she might have had some sort  of hidden malignancy; however, for now, her standing diagnosis is  cryptogenic cirrhosis, end-stage liver disease. The patient in the past  has been strongly encouraged to go to [facility] to enlist in the Liver Transplant  Unit. Her last admission was November 28, 2007. At that time, she was also  presenting with ascites, anasarca, portal hypertension, cryptogenic  cirrhosis, acute renal failure, chronic kidney disease, chronic blood loss  anemia, coagulopathy, hypokalemia, and hyperbilirubinemia. The patient  comes in almost 1 month later with no followup. She has attempted to  follow up with [name] and primary care physician, [name], whom her  husband says she has not followed up with, and she has not gone to [facility] to  see if she qualifies for the liver transplant list despite multiple  encouragement and warning about the seriousness of her condition. The  patient comes in with severe ascites, portal hypertension as well as  hypoxic respiratory failure. She is normally on 2 liters nasal cannula at  night, _____; with exertion, she will drop down to 87%. She also presents  with hyperbilirubinemia and jaundice, elevated liver function enzymes. We  were asked to admit. The patient again needs a large volume paracentesis   and also empiric coverage for a spontaneous retroperitonitis. The patient  denies fevers. She has chills from time to time. She has some nausea, no  obvious diarrhea; however, she is on lactulose. On occasions, she does see  bright red blood per rectum but really has been secretive about this and  has not told her husband. She has been ambulating, but due to her  weakness, she has apparently fallen on her rear end once but has not hit  her head.    PAST MEDICAL HISTORY: Includes:  1. Cirrhosis of unclear origin.  2. History of coagulopathy.  3. History of anemia.  4. History of chronic leukocytosis.  5. Irritable bowel syndrome.  6. Anxiety and depression.  7. Gastroesophageal reflux disorder.  8. Degenerative joint disease.  9. Irritable bowel syndrome, status post colonoscopy in 2008, 1 polyp  as well as internal hemorrhoids.  10. Status post EGD.  11. Pneumonia in 2009.    FAMILY HISTORY: Significant for breast cancer and uterine cancer in her  mother. She has a sister with Crohn disease.    SOCIAL HISTORY: She is married. She smokes 1 cigarette per day. She does  not use alcohol.    ALLERGIES: No known drug allergies.    MEDICATIONS: Her home medications include the following: From her last  discharge summary, currently, she is taking the following:  1. Lasix 40 mg by mouth daily.  2. Lactulose 10 g 1 tablet 2 times a day.  3. Prilosec over-the-counter or Pepcid 1 tablet daily.  4. Zoloft 50 mg by  mouth daily.  5. Oxycodone 5 mg by mouth every 4 hours as needed for pain.  6. Potassium chloride.    Her primary care physician is essentially [name] whom she does not  follow with, and [name] is her GI doctor.    REVIEW OF SYSTEMS: General: The patient feels weak and deconditioned.  She is volume overloaded, intravascularly volume depleted with ascites,  jaundice, extreme fatigue, chills, decreased appetite, nausea, loose stool  secondary to her lactulose, and some bright red blood per rectum. She   denies lightheadedness. HEENT: She denies headache, denies blurry vision,  denies sore throat, denies thrush or cervical lymphadenopathy.  Cardiovascular: No chest pain, no chest pressure, no history of angina, no  history of coronary artery disease. Pulmonary: No history of asthma or  emphysema. She, at one point, was felt to have a hepatopulmonary shunt.  She has had 2D echocardiogram with bubble study. 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 She does have  some chronic hypoxic respiratory failure for which she is on 2 liters nasal  cannula at night with exertion. She presented initially without oxygen and  her saturations as low as 87%. There is no history of asthma or other  interstitial lung disease. GI: She has had a history of end-stage renal  disease, cryptogenic cirrhosis, chronic anemia, portal hypertension, and  repeat paracenteses. She admits to nausea, no vomiting. No history of  varices. She has a history of bright red blood per rectum, which she is  admitting to, but this is apparently chronic, mixed in with the stool,  which she is producing from her lactulose. Renal: She has had history of  chronic kidney disease, stage III. Today, she presents with acute on  chronic kidney disease and likely hepatorenal syndrome. Musculoskeletal:  No history of lupus or rheumatoid arthritis. No history of hip or knee  replacements. Endocrine: No history of diabetes, hyperlipidemia, or  hypothyroidism. Neurological: No previous TIAs, syncope, stroke, or  seizure. Skin: She has had jaundiced icterus around her eyes, scleral  icterus as well as all of her skin. I see no obvious petechiae or purpura  currently. She has had some pruritus. Psychiatric: No history of  anxiety, depression, bipolarism, or schizophrenia. No suicidal or  homicidal thought; however, the patient seems to be neglectful or not quite  understanding of the seriousness and gravity of her terminal illness.     PHYSICAL EXAMINATION:  VITAL SIGNS: Temperature 97.7. Pulse is 97. Respirations 18. blood  pressure 101/66. Initially, 94% on room air, and with exertion or sitting  up, the patient would desaturate to 87% on room air. Blood pressure is  98/54.  GENERAL: Volume-overloaded, swollen, ascites, jaundiced female, resting in  the hospital bed.  HEENT: Scleral icterus. Pupils are equal and reactive to light. No  thrush.  NECK: No JVD.  CARDIOVASCULAR: S1, S2. Sinus rhythm.  LUNGS: Diminished breath sounds bilaterally, mainly at the bases. She  likely has a hepatohydrothorax, perhaps bilateral effusions. I am awaiting  a chest x-ray.  ABDOMEN: No epigastric discomfort. No right upper quadrant pain. She has  a large, distended abdomen with ascites and fluid. She does have faint  bowel sounds. She has a nonsurgical abdomen with hepatosplenomegaly. She  has pitting edema on her abdomen. Even with my stethoscope, I can see the  indentation.  EXTREMITIES: Femoral pulses 2+ in her left extremity. She does have  chronic venous stasis associated with edema and her  anasarca with some  extensions of her erythema. Femoral pulses 2+ and 1+ dorsalis pedis  pulses.  SKIN: Again, evidence of scleral icterus and jaundice as well as icterus  over her skin.  NEUROLOGICAL: Cranial nerves 2 through 12 are intact bilaterally. No  focal neurologic deficits. No dysdiadochokinesis. I did not assess her  gait. She does have diminished strength, but this is more global, with her  lower extremity edema. The patient does not have asterixis on examination.  She is clinically clear-minded at this time.    DIAGNOSTIC DATA: Labs today showed the following: White count 17.1,  hemoglobin 8.3, hematocrit 24.2, MCV 85.5, platelets 148,000 with 74%  neutrophils. Sodium 140, potassium 3, chloride 105, bicarbonate 22, BUN  30. Creatinine 2.4, baseline creatinine did come down to 1.3 to 1.8;   however, on her last admission, her creatinine was as high as 1.7. Calcium  8.4, total bilirubin is 6.1. On her last admission, her total bilirubin  was 4.7. The patient 's AST 50, ALT 31, alkaline phosphatase 240, total  protein 6.6, albumin 1.7, globulin 4.9. INR 2, PTT 35.0. Urinalysis not  done at the time of my evaluation. Chest x-ray not done at the time of my  evaluation. Right upper quadrant ultrasound not done at the time of my  evaluation. Doppler ultrasound not done at the time of my evaluation.    DISCUSSION AND IMPRESSION: This is a 47 year old female who presents with  acute ascites, edema, portal hypertension. She also presents with acute  hypoxic respiratory failure. She is being admitted mainly for the ascites  and portal hypertension, again in need of large-volume paracentesis to rule  out spontaneous retroperitonitis. 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    PROBLEMS:  1. 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 I am awaiting a chest x-ray. I have written for a  jet nebulizer solution. I have ordered blood cultures, 2 liters nasal  cannula.  2. Ascites, edema, portal hypertension. I have discussed with  Interventional Radiology. The plan is to have a large-volume paracentesis.  If she is coagulopathic, she may need FFP or packed red cells, but I would  be cautious about this. She is likely intravascularly volume depleted but  volume overloaded with third spacing secondary to end-stage liver disease  from her cryptogenic cirrhosis. We need to rule out spontaneous  retroperitonitis. I have written for Rocephin as well.  3. 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 This is a chronic issue. I   have asked [name] on consultation. I have continued her lactulose for  now. I will recheck her liver enzymes in the morning. I have also written  for right upper quadrant ultrasound with marking for ascites.  4. Anemia, coagulopathy, and hyperbilirubinemia. I have written for  type and cross; 2 units of packed red cells, we will keep ahead, as well as  FFP. We will recheck her bilirubin and her coagulations in the morning as  well as her CBC. The patient also had some lower GI bleed complaint. She  may need transfusion. This could be due to some rectal varices as well. I  will defer to [name].  5. Acute renal failure on chronic kidney disease, stage III to IV,  possibly hepatorenal syndrome. She is prerenal. I am discontinuing her  Lasix for now. I am not giving  her intravenous fluids either. We may need  to resume this at a later time; however, given the patient's  intravascularly volume depleted state and third spacing, I do not want to  make her _____ worse.  6. Hypotension. If needed, the patient may receive a unit of packed red  cells to help her anemia as well as her blood pressure. This also may make  her less prerenal.  7. Hypokalemia. Her potassium is 3, which is on the low side, with a  creatinine, which is very elevated at 2.4. I will gently replete her with  intravenous potassium and recheck her appropriately.  8. The patient is currently full code. A lengthy discussion was held  with the patient and her husband at bedside. She is currently not  encephalopathic. She is currently alert and oriented. She clearly tells  me that she would allow life support including intubation, mechanical  ventilation, shocks, and cardiac resuscitation if indicated.    Greater than 60 to 70 minutes spent coordinating this patient's admission.  In addition, I have called the Intervention of Dr. Margarite Gouge to set up a  large-volume paracentesis as well as fluid sent for Gram stain and culture   to rule out SBP for tomorrow morning. I have asked also [name] and Dr.  Sandie Ano in consultation. The patient does have acute renal failure and  chronic kidney disease; however, she is not presently acidotic. Her anion  gap is only 13. She is not hyperkalemic. She is not uremic. Therefore,  she is not in need of urgent, aggressive hemodialysis, which may not be  effective in hepatorenal syndrome as well. In addition, the patient  possibly may be transferred from here to [facility] if they are willing  to accept her to see if she can be placed on their liver transplant list.    Seventy minutes spent on this admission note.            E-Signed By  Drue Stager, MD 12/29/2007 14:48    Drue Stager, MD    cc: Doree Fudge, M.D.   Drue Stager, MD   [name], MD        GG/FI2; D: 12/28/2007 9:22 P; T: 12/28/2007 9:53 P; DOC# [id number]; Job#  [id number]

## 2007-12-29 LAB — CBC WITH AUTOMATED DIFF
ABS. BASOPHILS: 0 10*3/uL (ref 0.0–0.1)
ABS. EOSINOPHILS: 0.3 10*3/uL (ref 0.0–0.4)
ABS. LYMPHOCYTES: 2.5 10*3/uL (ref 0.8–3.5)
ABS. MONOCYTES: 1.5 10*3/uL — ABNORMAL HIGH (ref 0–1.0)
ABS. NEUTROPHILS: 12.1 10*3/uL — ABNORMAL HIGH (ref 1.8–8.0)
BASOPHILS: 0 % (ref 0–1)
EOSINOPHILS: 2 % (ref 0–7)
HCT: 22.6 % — ABNORMAL LOW (ref 35.0–47.0)
HGB: 7.7 g/dL — ABNORMAL LOW (ref 11.5–16.0)
LYMPHOCYTES: 15 % (ref 12–49)
MCH: 28.8 PG (ref 26.0–34.0)
MCHC: 34.1 g/dL (ref 30.0–35.0)
MCV: 84.6 FL (ref 80.0–99.0)
MONOCYTES: 9 % (ref 5–13)
NEUTROPHILS: 74 % (ref 32–75)
PLATELET: 113 10*3/uL — ABNORMAL LOW (ref 150–400)
RBC: 2.67 M/uL — ABNORMAL LOW (ref 3.80–5.20)
RDW: 22 % — ABNORMAL HIGH (ref 11.5–14.5)
WBC: 16.4 10*3/uL — ABNORMAL HIGH (ref 3.6–11.0)

## 2007-12-29 LAB — METABOLIC PANEL, BASIC
Anion gap: 12 mmol/L (ref 5–15)
BUN/Creatinine ratio: 14 (ref 12–20)
BUN: 31 MG/DL — ABNORMAL HIGH (ref 6–20)
CO2: 22 MMOL/L (ref 21–32)
Calcium: 8 MG/DL — ABNORMAL LOW (ref 8.5–10.1)
Chloride: 104 MMOL/L (ref 97–108)
Creatinine: 2.2 MG/DL — ABNORMAL HIGH (ref 0.6–1.3)
GFR est AA: 31 mL/min/{1.73_m2} — ABNORMAL LOW (ref >60)
GFR est non-AA: 26 mL/min/{1.73_m2} — ABNORMAL LOW (ref >60)
Glucose: 99 MG/DL (ref 50–100)
Potassium: 2.8 MMOL/L — ABNORMAL LOW (ref 3.5–5.1)
Sodium: 138 MMOL/L (ref 136–145)

## 2007-12-29 LAB — PROTHROMBIN TIME + INR
INR: 1.9 — ABNORMAL HIGH (ref 0.9–1.1)
Prothrombin time: 18.1 SECS — ABNORMAL HIGH (ref 9.0–11.0)

## 2007-12-29 LAB — PTT, ACTIVATED: aPTT: 35.1 s — ABNORMAL HIGH (ref 24.0–33.0)

## 2007-12-29 LAB — METABOLIC PANEL, COMPREHENSIVE
A-G Ratio: 0.4 — ABNORMAL LOW (ref 1.1–2.2)
ALT (SGPT): 30 U/L (ref 30–65)
AST (SGOT): 57 U/L — ABNORMAL HIGH (ref 15–37)
Albumin: 1.6 g/dL — ABNORMAL LOW (ref 3.5–5.0)
Alk. phosphatase: 239 U/L — ABNORMAL HIGH (ref 50–136)
Anion gap: 13 mmol/L (ref 5–15)
BUN/Creatinine ratio: 13 (ref 12–20)
BUN: 29 MG/DL — ABNORMAL HIGH (ref 6–20)
Bilirubin, total: 5.6 MG/DL — ABNORMAL HIGH (ref <1.0)
CO2: 22 MMOL/L (ref 21–32)
Calcium: 8 MG/DL — ABNORMAL LOW (ref 8.5–10.1)
Chloride: 102 MMOL/L (ref 97–108)
Creatinine: 2.2 MG/DL — ABNORMAL HIGH (ref 0.6–1.3)
GFR est AA: 31 mL/min/{1.73_m2} — ABNORMAL LOW (ref >60)
GFR est non-AA: 26 mL/min/{1.73_m2} — ABNORMAL LOW (ref >60)
Globulin: 4.5 g/dL — ABNORMAL HIGH (ref 2.0–4.0)
Glucose: 86 MG/DL (ref 50–100)
Potassium: 2.8 MMOL/L — ABNORMAL LOW (ref 3.5–5.1)
Protein, total: 6.1 g/dL — ABNORMAL LOW (ref 6.4–8.2)
Sodium: 137 MMOL/L (ref 136–145)

## 2007-12-29 LAB — AMMONIA: Ammonia, plasma: 72 umol/L — ABNORMAL HIGH (ref 11–32)

## 2007-12-29 LAB — CELL COUNT, BODY FLUID
FLD LYMPHS: 45 %
FLD MONO/MACROPHAGES: 38 %
FLD NEUTROPHILS: 12 %
FLUID MESOTHELIAL: 5 %
FLUID RBC CT.: >100 /mm3
FLUID WBC COUNT: 133 /mm3 — ABNORMAL HIGH (ref 0–5)

## 2007-12-29 LAB — ALBUMIN, FLUID: Albumin, body fld.: 0.1 g/dL

## 2007-12-29 NOTE — Consults (Signed)
Name: Breanna Lester, Breanna Lester Admitted: 12/28/2007  MR #: [id number] DOB: 09-Feb-1961  Account #: [id number] Age: 47  Consultant: P F. Wyvonnia Dusky, M.D. Location: [id number]     CONSULTATION REPORT    DATE OF CONSULTATION: 12/29/2007  REFERRING PHYSICIAN:        REASON FOR CONSULTATION: Liver failure.    HISTORY OF PRESENT ILLNESS: This is a 47 year old woman well known to Dr.  Kristine Linea for whom I am covering this weekend. She has a history of  cryptogenic cirrhosis complicated by coagulopathy, anemia, portal  hypertension, massive ascites, anasarca, and renal failure. She recently  was hospitalized with the massive ascites, underwent paracentesis, which  did not show any evidence of infection. She returns now with respiratory  failure, secondary to massive ascites. She denies abdominal pain. She has  had no nausea, vomiting, or rectal bleeding. She has had appointment set  up for [facility], which she failed to keep.    PAST MEDICAL HISTORY: Includes cryptogenic cirrhosis, coagulopathy,  anemia, chronic leukocytosis, irritable bowel syndrome, anxiety and  depression, gastroesophageal reflux disease, degenerative joint disease,  status post prior colonoscopy with benign polyp removal, internal  hemorrhoids, recent rectal fissure seen on flexible sigmoidoscopy, and  pneumonia.    FAMILY HISTORY: Breast cancer, uterine cancer in mother; sister with Crohn  disease.    SOCIAL HISTORY: Married. Smokes one cigarette per day. Does not drink  any alcohol.    ALLERGIES: None known.    MEDICATIONS PRIOR TO ADMISSION:  1. Lasix 40 mg daily.  2. Lactulose 2 times a day.  3. Prilosec over-the-counter or Pepcid 1 daily.  4. Zoloft 50 mg daily.  5. Oxycodone 5 mg every 4 hours as needed.  6. Potassium chloride, dose unknown.    REVIEW OF SYSTEMS: Generalized weakness, loss of appetite, fatigue, some  chills. No chest pain. Positive shortness of breath. GI, per history of   present illness. No dysuria, hematuria, positive extremity swelling. No  rash.    PHYSICAL EXAMINATION:  GENERAL: Chronically ill-appearing woman.  VITAL SIGNS: Blood pressure 159/81, pulse 83, respirations 26, temperature  99.  SKIN: Shows spider angiomata, palmar erythema, jaundice, obvious ascites.  HEENT EXAMINATION: Scleral icterus.  NECK: Supple.  LUNGS: Decreased breath sounds in the bases.  ABDOMEN: Protuberant with ascites, diffusely mildly tender pitting edema  involving the entire abdomen and back.  EXTREMITIES: Anasarca.  NEUROLOGIC: No asterixis.    DIAGNOSTIC DATA: WBC 16.4, hemoglobin 7.7, hematocrit 22.6, platelet count  113,000. PT 18.1, INR 1.9, potassium 2.8, BUN 29, creatinine 2.2, albumin  1.6, bilirubin 5.6, alkaline phosphatase 239, GOT 57, ammonia 72.    IMPRESSION:  1. End-stage liver disease, needs urgent transplantation without which  she will certainly die.  2. Respiratory failure secondary to ascites and anasarca.  3. Medical noncompliance.  4. Anemia.  5. Acute-on-chronic renal disease.  6. _____.  7. Rectal bleeding with recent flexible sigmoidoscopy showing boggy,  edematous, but otherwise normal mucosa along with possible anal fissure and  external hemorrhoids.    RECOMMENDATION:  1. Would transfer to [facility] for urgent transplant  evaluation. [name] will be back tomorrow morning. _____ can arrange  this for you.  2. In the meantime, I agree with intravenous fluids, albumin, oxygen,  paracentesis as you have done.    3. I have explained in detail to the patient, her prognosis without  transplantation. She seems to understand.  E-Signed By  Skeet Latch. Wyvonnia Dusky, M.D. 01/10/2008 17:15    P F. Wyvonnia Dusky, M.D.    cc: Doree Fudge, M.D.   Skeet Latch. Wyvonnia Dusky, M.D.   [name], MD        PFD/FI2; D: 12/29/2007 1:03 P; T: 12/29/2007 1:24 P; DOC# [id number]; Job#  [id number]

## 2007-12-30 LAB — BLOOD GAS ARTERIAL,VENT
BASE DEFICIT: 3.5 mmol/L — AB
BASE DEFICIT: 2.5 mmol/L — AB
BASE DEFICIT: 4.6 mmol/L — AB
BASE DEFICIT: 3.4 mmol/L — AB
BASE DEFICIT: 1.1 mmol/L — AB
BICARBONATE: 22 mmol/L (ref 22–26)
BICARBONATE: 24 mmol/L (ref 22–26)
BICARBONATE: 25 mmol/L (ref 22–26)
BICARBONATE: 25 mmol/L (ref 22–26)
BICARBONATE: 24 mmol/L (ref 22–26)
FIO2: 100
FIO2: 90
FIO2: 100
FIO2: 100
FIO2: 100
O2 FLOW RATE: 6 L/min
O2 SAT: 98 % — ABNORMAL HIGH (ref 92–97)
O2 SAT: 98 % — ABNORMAL HIGH (ref 92–97)
O2 SAT: 80 % — ABNORMAL LOW (ref 92–97)
O2 SAT: 88 % — ABNORMAL LOW (ref 92–97)
O2 SAT: 97 % (ref 92–97)
PCO2: 42 mmHg (ref 35–45)
PCO2: 50 mmHg — ABNORMAL HIGH (ref 35–45)
PCO2: 67 mmHg — CR (ref 35–45)
PCO2: 59 mmHg — CR (ref 35–45)
PCO2: 43 mmHg (ref 35–45)
PEEP/CPAP: 6
PEEP/CPAP: 5
PEEP/CPAP: 10
PO2: 110 mmHg — ABNORMAL HIGH (ref 80–100)
PO2: 113 mmHg — ABNORMAL HIGH (ref 80–100)
PO2: 55 mmHg — CL (ref 80–100)
PO2: 62 mmHg — ABNORMAL LOW (ref 80–100)
PO2: 90 mmHg (ref 80–100)
SET RATE: 14
SET RATE: 14
SET RATE: 15
SET RATE: 26
SPONTANEOUS RATE: 32
SPONTANEOUS RATE: 34
SPONTANEOUS RATE: 18
SPONTANEOUS RATE: 15
SPONTANEOUS RATE: 26
VT/PIP: 12
VT/PIP: 500
VT/PIP: 360
pH: 7.34 — ABNORMAL LOW (ref 7.35–7.45)
pH: 7.31 — CL (ref 7.35–7.45)
pH: 7.19 — CL (ref 7.35–7.45)
pH: 7.25 — CL (ref 7.35–7.45)
pH: 7.37 (ref 7.35–7.45)

## 2007-12-30 LAB — URINALYSIS W/O MICRO
Bacteria: NEGATIVE /HPF
Bilirubin: NEGATIVE
Glucose: NEGATIVE MG/DL
Ketone: NEGATIVE MG/DL
Leukocyte Esterase: NEGATIVE
Nitrites: NEGATIVE
Protein: NEGATIVE MG/DL
Specific gravity: 1.011 (ref 1.003–1.030)
Urobilinogen: 0.2 EU/DL (ref 0.2–1.0)
pH (UA): 5 (ref 5.0–8.0)

## 2007-12-30 LAB — CBC W/O DIFF
HCT: 25.5 % — ABNORMAL LOW (ref 35.0–47.0)
HGB: 8.7 g/dL — ABNORMAL LOW (ref 11.5–16.0)
MCH: 29.7 PG (ref 26.0–34.0)
MCHC: 34.1 g/dL (ref 30.0–35.0)
MCV: 87 FL (ref 80.0–99.0)
PLATELET: 131 10*3/uL — ABNORMAL LOW (ref 150–400)
RBC: 2.93 M/uL — ABNORMAL LOW (ref 3.80–5.20)
RDW: 21.9 % — ABNORMAL HIGH (ref 11.5–14.5)
WBC: 19.5 10*3/uL — ABNORMAL HIGH (ref 3.6–11.0)

## 2007-12-30 LAB — URINALYSIS W/MICROSCOPIC
Bacteria: NEGATIVE /HPF
Bilirubin: NEGATIVE
Glucose: NEGATIVE MG/DL
Ketone: NEGATIVE MG/DL
Leukocyte Esterase: NEGATIVE
Nitrites: NEGATIVE
Protein: NEGATIVE MG/DL
RBC: >100 /HPF — ABNORMAL HIGH (ref 0–5)
Specific gravity: 1.01 (ref 1.003–1.030)
Urobilinogen: 0.2 EU/DL (ref 0.2–1.0)
pH (UA): 5.5 (ref 5.0–8.0)

## 2007-12-30 LAB — METABOLIC PANEL, BASIC
Anion gap: 12 mmol/L (ref 5–15)
BUN/Creatinine ratio: 15 (ref 12–20)
BUN: 29 MG/DL — ABNORMAL HIGH (ref 6–20)
CO2: 24 MMOL/L (ref 21–32)
Calcium: 8.4 MG/DL — ABNORMAL LOW (ref 8.5–10.1)
Chloride: 103 MMOL/L (ref 97–108)
Creatinine: 1.9 MG/DL — ABNORMAL HIGH (ref 0.6–1.3)
GFR est AA: 37 mL/min/{1.73_m2} — ABNORMAL LOW (ref >60)
GFR est non-AA: 30 mL/min/{1.73_m2} — ABNORMAL LOW (ref >60)
Glucose: 99 MG/DL (ref 50–100)
Potassium: 3 MMOL/L — ABNORMAL LOW (ref 3.5–5.1)
Sodium: 139 MMOL/L (ref 136–145)

## 2007-12-30 LAB — SODIUM, UR, RANDOM: Sodium,urine random: 26 MMOL/L (ref 20–110)

## 2007-12-30 LAB — AMMONIA: Ammonia, plasma: 69 umol/L — ABNORMAL HIGH (ref 11–32)

## 2007-12-30 LAB — MAGNESIUM: Magnesium: 1.9 MG/DL (ref 1.6–2.4)

## 2007-12-30 LAB — CREATININE, UR, RANDOM: Creatinine, urine random: 40.8 MG/DL (ref 30–125)

## 2007-12-30 NOTE — Consults (Signed)
Name: Breanna Lester, Breanna Lester Admitted: 12/28/2007  MR #: [id number] DOB: 01-Mar-1961  Account #: [id number] Age: 47  Consultant: Gaspar Skeeters. Lachelle Rissler, MD Location: [phone]     CONSULTATION REPORT    DATE OF CONSULTATION: 12/30/2007  REFERRING PHYSICIAN: Cyndie Chime, MD        REASON FOR CONSULT: We are asked to see this patient in consultation by  Dr. Cyndie Chime regarding respiratory failure.    HISTORY OF PRESENT ILLNESS: This is a 47 year old female with cryptogenic  cirrhosis and end-stage liver disease who has had progressively worsening  end-stage liver disease for some time. She has really been noncompliant  with a transplant evaluation and has missed multiple appointments to go to  [facility] for that evaluation. She was recently in the hospital for volume  overload and had a large volume paracentesis and presented again on Dec 28, 2007 to the hospital with anasarca and large amount of ascites. She had an  uneventful paracentesis where approximately 9 liters of fluid was removed  on Dec 28, 2007. She really had no problem after that. She had a catheter  that was left in. However yesterday, she received a blood transfusion and  within an hour had progressively worsening respiratory failure, shortness  of breath and hypoxia. She was moved to the ICU and placed on BiPAP  therapy but developed progressively worsening respiratory acidosis and  hypoxemia and was intubated this morning. She is now critically ill.    PAST MEDICAL SURGICAL HISTORY:  1. Cryptogenic cirrhosis.  2. Chronic hypoxemia, on home oxygen. There is some debate as to  whether the patient has hepatopulmonary syndrome.  3. Coagulopathy.  4. Anemia.  5. Chronic leukocytosis, at least according to other notes, although  this is not really borne out in the laboratory data.  6. Irritable bowel syndrome.  7. Anxiety.  8. Depression.  9. GERD.  10. Arthritis.    ALLERGIES: No known drug allergies.    CURRENT MEDICATIONS: Include:   1. Ceftriaxone 1 g intravenously daily.  2. Albuterol and Atrovent nebulizers.  3. Mucinex.  4. Lactulose 20 g 3 times a day.  5. Levaquin 250 mg by mouth daily.  6. Protonix 40 mg intravenously daily.  7. Zoloft 50 mg by mouth daily.    FAMILY HISTORY: Significant for breast cancer and uterine cancer. Her  mother has a history of Crohn disease.    SOCIAL HISTORY: She is married. She smokes 1 cigarette a day. She does  not use alcohol.    REVIEW OF SYSTEMS: Not obtainable as the patient is intubated and  sedated.    PHYSICAL EXAMINATION:  VITAL SIGNS: The patient is afebrile. Pulse 111. Respirations 14. Blood  pressure 145/71. O2 saturation 93% on 100% FiO2 on the ventilator.  GENERAL: This is an ill-appearing 47 year old white female who is  intubated on the ventilator.  HEENT: Eyes, sclerae are mildly icteric. Pupils are sluggish but  reactive. ENT, there is an endotracheal tube, which is in place.  LYMPHATICS: There is no cervical or supraclavicular adenopathy.  CARDIAC: Tachycardic with a regular rhythm.  PULMONARY: There are diffuse rales throughout all lung fields.  GI: Abdomen is mildly distended. There is drainage from her paracentesis  site.  GU: The patient has a Foley in place.  MUSCULOSKELETAL: There are no gross joint deformities. She does have 2+  lower extremity edema.  SKIN: Warm and dry. There is no obvious rash.  NEUROLOGIC: The patient is intubated and sedated and has recently  _____  her intubation.    DIAGNOSTIC DATA: White blood cell count 19.5, hemoglobin 8.7, hematocrit  26, platelets 131,000. She had a normal differential yesterday. INR is  1.9. Sodium 139, potassium 3.0, chloride 103, bicarbonate 24, BUN is 29,  creatinine 1.9, glucose 99, calcium is 8.4, albumin is 1.6, ammonia 69.  Her AST yesterday was 57. Her ALT was 30. Arterial blood gas this  morning, pH 7.19, pCO2 67, pO2 55 on BiPAP with 100% FiO2.    I personally reviewed the patient's chest x-ray, which reveals increasing   bilateral airspace disease consistent with pulmonary edema or acute  respiratory distress syndrome.    IMPRESSION: This is a 47 year old female with end-stage liver disease.  She was in need of transplantation who is an unlikely candidate for that  given her history of noncompliance. She now has progressive respiratory  failure of unclear cause. My suspicion is based on the timing of events  that she may have transfusion-related acute lung injury, however, cannot  completely rule out the possibility of pneumonia, also potentially volume  overload, although she has been diuresed with reasonably good results. She  has acute renal failure of unclear etiology as well as leukocytosis, which  is elevated beyond her baseline. She is critically ill and at high risk of  further complications.    RECOMMENDATIONS:  1. The patient is to continue on ventilator support. Will adjust based  on arterial blood gas analysis.  2. She should be pancultured including blood, urine and sputum. Her  paracentesis fluid was underwhelming for signs of infection.  3. Her antibiotics should be adjusted to cover for healthcare  associated organisms and healthcare associated pneumonia especially given  her recent hospitalization.  4. She should have an echocardiogram performed to evaluate her ejection  fraction to see if there has been any change since her prior.  5. Her coagulations should be followed.  6. She really needs a liver transplant, although I doubt she is going  to be a reliable candidate given her [facility] of [facility].  7. Her overall long-term prognosis is poor.  8. The patient should be maintained on DVT and GI prophylaxis.    Total critical care time exclusive of procedures 60 minutes.    Thank you very much for allowing me to participate in the care of your  patient.          E-Signed By  Gaspar Skeeters Dalary Hollar, MD 01/02/2008 10:48    Gaspar Skeeters. Monay Houlton, MD    cc: Cyndie Chime, M.D.    [name], MD   Gaspar Skeeters Joey Lierman, MD        JES/FI2; D: 12/30/2007 8:30 A; T: 12/30/2007 8:50 A; DOC# [id number]; Job#  [id number]

## 2007-12-31 LAB — PTT, ACTIVATED: aPTT: 35.5 s — ABNORMAL HIGH (ref 24.0–33.0)

## 2007-12-31 LAB — CBC WITH AUTOMATED DIFF
ABS. BASOPHILS: 0 10*3/uL (ref 0.0–0.1)
ABS. EOSINOPHILS: 0.2 10*3/uL (ref 0.0–0.4)
ABS. LYMPHOCYTES: 3 10*3/uL (ref 0.8–3.5)
ABS. MONOCYTES: 1.2 10*3/uL — ABNORMAL HIGH (ref 0–1.0)
ABS. NEUTROPHILS: 11.2 10*3/uL — ABNORMAL HIGH (ref 1.8–8.0)
BASOPHILS: 0 % (ref 0–1)
EOSINOPHILS: 1 % (ref 0–7)
HCT: 22.2 % — ABNORMAL LOW (ref 35.0–47.0)
HGB: 7.7 g/dL — ABNORMAL LOW (ref 11.5–16.0)
LYMPHOCYTES: 19 % (ref 12–49)
MCH: 30.1 PG (ref 26.0–34.0)
MCHC: 34.7 g/dL (ref 30.0–35.0)
MCV: 86.7 FL (ref 80.0–99.0)
MONOCYTES: 8 % (ref 5–13)
NEUTROPHILS: 72 % (ref 32–75)
PLATELET: 122 10*3/uL — ABNORMAL LOW (ref 150–400)
RBC: 2.56 M/uL — ABNORMAL LOW (ref 3.80–5.20)
RDW: 21.6 % — ABNORMAL HIGH (ref 11.5–14.5)
WBC: 15.6 10*3/uL — ABNORMAL HIGH (ref 3.6–11.0)

## 2007-12-31 LAB — CULTURE, MRSA

## 2007-12-31 LAB — CULTURE, URINE
Colonies Counted: <1000
Colonies Counted: <1000
Colony Count: <1000
Colony Count: <1000
Culture result:: NO GROWTH
Culture result:: NO GROWTH
Culture: NO GROWTH
Culture: NO GROWTH

## 2007-12-31 LAB — METABOLIC PANEL, BASIC
Anion gap: 11 mmol/L (ref 5–15)
BUN/Creatinine ratio: 17 (ref 12–20)
BUN: 34 MG/DL — ABNORMAL HIGH (ref 6–20)
CO2: 26 MMOL/L (ref 21–32)
Calcium: 8.3 MG/DL — ABNORMAL LOW (ref 8.5–10.1)
Chloride: 106 MMOL/L (ref 97–108)
Creatinine: 2 MG/DL — ABNORMAL HIGH (ref 0.6–1.3)
GFR est AA: 34 mL/min/{1.73_m2} — ABNORMAL LOW (ref >60)
GFR est non-AA: 29 mL/min/{1.73_m2} — ABNORMAL LOW (ref >60)
Glucose: 92 MG/DL (ref 50–100)
Potassium: 3 MMOL/L — ABNORMAL LOW (ref 3.5–5.1)
Sodium: 143 MMOL/L (ref 136–145)

## 2007-12-31 LAB — METABOLIC PANEL, COMPREHENSIVE
A-G Ratio: 0.5 — ABNORMAL LOW (ref 1.1–2.2)
ALT (SGPT): 29 U/L — ABNORMAL LOW (ref 30–65)
AST (SGOT): 65 U/L — ABNORMAL HIGH (ref 15–37)
Albumin: 2 g/dL — ABNORMAL LOW (ref 3.5–5.0)
Alk. phosphatase: 164 U/L — ABNORMAL HIGH (ref 50–136)
Anion gap: 13 mmol/L (ref 5–15)
BUN/Creatinine ratio: 17 (ref 12–20)
BUN: 37 MG/DL — ABNORMAL HIGH (ref 6–20)
Bilirubin, total: 5.7 MG/DL — ABNORMAL HIGH (ref <1.0)
CO2: 24 MMOL/L (ref 21–32)
Calcium: 8.3 MG/DL — ABNORMAL LOW (ref 8.5–10.1)
Chloride: 106 MMOL/L (ref 97–108)
Creatinine: 2.2 MG/DL — ABNORMAL HIGH (ref 0.6–1.3)
GFR est AA: 31 mL/min/{1.73_m2} — ABNORMAL LOW (ref >60)
GFR est non-AA: 26 mL/min/{1.73_m2} — ABNORMAL LOW (ref >60)
Globulin: 4.1 g/dL — ABNORMAL HIGH (ref 2.0–4.0)
Glucose: 83 MG/DL (ref 50–100)
Potassium: 3.4 MMOL/L — ABNORMAL LOW (ref 3.5–5.1)
Protein, total: 6.1 g/dL — ABNORMAL LOW (ref 6.4–8.2)
Sodium: 143 MMOL/L (ref 136–145)

## 2007-12-31 LAB — AMMONIA: Ammonia, plasma: 72 umol/L — ABNORMAL HIGH (ref 11–32)

## 2007-12-31 LAB — PROTHROMBIN TIME + INR
INR: 2.1 — ABNORMAL HIGH (ref 0.9–1.1)
Prothrombin time: 19.6 SECS — ABNORMAL HIGH (ref 9.0–11.0)

## 2007-12-31 LAB — BLOOD GAS ARTERIAL,VENT
BASE DEFICIT: 1.1 mmol/L — AB
BICARBONATE: 23 mmol/L (ref 22–26)
FIO2: 50
O2 SAT: 88 % — ABNORMAL LOW (ref 92–97)
PCO2: 36 mmHg (ref 35–45)
PEEP/CPAP: 10
PO2: 53 mmHg — CL (ref 80–100)
SET RATE: 26
SPONTANEOUS RATE: 29
VT/PIP: 360
pH: 7.42 (ref 7.35–7.45)

## 2007-12-31 LAB — SODIUM, UR, RANDOM: Sodium,urine random: 7 MMOL/L — ABNORMAL LOW (ref 20–110)

## 2007-12-31 LAB — CREATININE, UR, RANDOM: Creatinine, urine random: 238.2 MG/DL — ABNORMAL HIGH (ref 30–125)

## 2007-12-31 LAB — PHOSPHORUS: Phosphorus: 4.8 MG/DL (ref 2.5–4.9)

## 2007-12-31 LAB — MAGNESIUM: Magnesium: 1.7 MG/DL (ref 1.6–2.4)

## 2007-12-31 NOTE — Procedures (Signed)
Procedures signed by  at 02/02/08  2127                 Author: Georga Bora, MD  Service: --  Author Type: Physician            Filed:   Date of Service: 12/31/07 0947  Status: Signed            Procedure Orders        1. ECHO [[id number]] ordered by  at 12/31/07 0947                         <!--EPICS--> Name:      Breanna Lester, Breanna Lester               Admitted:   12/28/2007<BR> MR #:      [id number]                     DOB:        Feb 27, 1961<BR>  Account #: [id number]                  Age         46<BR> Ref MD:                                  Location:   [id number]<BR> <BR>                           ECHOCARDIOGRAPHY REPORT<BR> <BR> STUDY DATE:<BR> <BR> PROCEDURE:  Echocardiogram.<BR> <BR> ATTENDING  PHYSICIAN:  Dr. Chrissie Noa Ciesla<BR> <BR> ORDERING PHYSICIAN:  Dr. Riki Rusk Schenkein<BR> <BR> FINDINGS:  Transthoracic 2-dimensional color and Doppler echocardiography<BR> revealed the following:<BR> 1.  Normal left ventricular size and systolic function,  estimated ejection<BR> fraction 55-60%.<BR> 2.  Normal right ventricular size and systolic function.<BR> 3.  Atria within normal limits.<BR> 4.  Aortic valve appears trileaflet and structurally normal without<BR> evidence of stenosis or regurgitation.<BR>  5.  Mitral valve appears structurally normal with trivial mitral<BR> regurgitation.<BR> 6.  The tricuspid valve appears structurally normal with mild tricuspid<BR> regurgitation.  There is mild pulmonary hypertension with the PA systolic<BR> pressure  of 43 mmHg.<BR> 7.  The pulmonic valve appears normal in structure and function.<BR> 8.  There is no pericardial effusion.<BR> 9.  Aortic root measured within normal limits.<BR> <BR> CONCLUSIONS<BR> 1.  Normal left ventricular size and systolic function.<BR>  2.  Mild tricuspid regurgitation.<BR> 3.  Mild pulmonary hypertension.<BR> 4.  Otherwise normal study.<BR> <BR> <BR> <BR> E-Signed By<BR> Georga Bora, MD 02/02/2008 21:27<BR> <BR> Georga Bora, MD<BR>  <BR> cc:   Cyndie Chime, M.D.<BR>        Gaspar Skeeters. Schenkein, MD<BR>       Georga Bora, MD<BR> <BR> <BR>       [facility]*<BR> <BR> MW/WMX; D: 12/30/2007  4:27 P; T: 12/31/2007  9:47 A; DOC# [id number]; Job#<BR> [id number]<SW> <!--EPICE-->

## 2007-12-31 NOTE — Procedures (Signed)
Name: Breanna Lester, Breanna Lester Admitted: 12/28/2007  MR #: [id number] DOB: [birth year]  Account #: [id number] Age 47  Ref MD: Location: [id number]     ECHOCARDIOGRAPHY REPORT    STUDY DATE:    PROCEDURE: Echocardiogram.    ATTENDING PHYSICIAN: Dr. Cyndie Chime    ORDERING PHYSICIAN: Dr. Charlynn Court    FINDINGS: Transthoracic 2-dimensional color and Doppler echocardiography  revealed the following:  1. Normal left ventricular size and systolic function, estimated ejection  fraction 55-60%.  2. Normal right ventricular size and systolic function.  3. Atria within normal limits.  4. Aortic valve appears trileaflet and structurally normal without  evidence of stenosis or regurgitation.  5. Mitral valve appears structurally normal with trivial mitral  regurgitation.  6. The tricuspid valve appears structurally normal with mild tricuspid  regurgitation. There is mild pulmonary hypertension with the PA systolic  pressure of 43 mmHg.  7. The pulmonic valve appears normal in structure and function.  8. There is no pericardial effusion.  9. Aortic root measured within normal limits.    CONCLUSIONS  1. Normal left ventricular size and systolic function.  2. Mild tricuspid regurgitation.  3. Mild pulmonary hypertension.  4. Otherwise normal study.        E-Signed By  Georga Bora, MD 02/02/2008 21:27    Georga Bora, MD    cc: Cyndie Chime, M.D.   Gaspar Skeeters Schenkein, MD   Georga Bora, MD       [facility]*    MW/WMX; D: 12/30/2007 4:27 P; T: 12/31/2007 9:47 A; DOC# [id number]; Job#  [id number]

## 2007-12-31 NOTE — Consults (Signed)
Name: Breanna Lester, Breanna Lester Admitted: 12/28/2007  MR #: [id number] DOB: May 25, 1961  Account #: [id number] Age: 47  Consultant: Alver Fisher, III, M.D. Location: [id number]     CONSULTATION REPORT    DATE OF CONSULTATION: 12/31/2007      HISTORY OF PRESENT ILLNESS: The patient is an unfortunate, 47 year old  female with a history of cryptogenic cirrhosis as well as end-stage liver  disease and anemia. The patient is currently intubated. She is sedated.  No history was obtained from her. History was obtained from thorough  review of the patient's records. Additionally, I discussed the case with  the patient's nurse.    The patient had been in the hospital from 11/20/2007, to 11/23/2007. I  reviewed those records. The patient was admitted again with the  cryptogenic cirrhosis as well as ascites and elevated LFTs. The patient  was ruled out for peritonitis. She, however, was covered with antibiotics.  The patient underwent a large volume paracentesis. She also received some  blood during that hospital stay. The patient was discharged home, but was  told to follow up with [facility] for Hepatology clinic. Most recent consultation  from Dr. Rivka Spring indicates that apparently the patient being noncompliant  with a transplant evaluation, missed multiple appointments at [facility]. In any  case, she came back, Dec 28, 2007, with anasarca and ascites again. The  patient had 9 liters of fluid removed with the paracentesis and the drain  left in. The patient apparently improved following that. The patient,  however, did receive a blood transfusion, and within an hour of that,  developed respiratory failure requiring transfer to the ICU and  intubation.    The patient did not receive any contrast. The patient had abdominal  ultrasound that showed no hydronephrosis. The patient's creatinine has  been as low as 1.3 on November 23, 2007, now, is up to 2.2. Because of her  acute renal failure, we were asked to see the patient in consultation. The   nurse also reports to me that the patient's urine output has declined. She  has been less responsive to diuretics.    PAST MEDICAL SURGICAL HISTORY:  1. End-stage liver disease.  2. Cryptogenic cirrhosis.  3. History of anemia.  4. Coagulopathy.  5. Irritable bowel.  6. Anxiety.  7. Severe depression.  8. Refluxes.    ALLERGIES: The patient has no known drug allergies.    MEDICATION: The patient's medications in the hospital have been reviewed.    The patient's social history, family history, review of systems all  unobtainable. I have reviewed the patient's recent consultation notes from  this hospital stay.    PHYSICAL EXAMINATION:  VITAL SIGNS: The patient's physical examination today shows the patient's  blood pressure to be 104/40, pulse 108, respiratory rate is 27.  GENERAL: In general, she is a quite ill-appearing intubated woman.  HEENT: Normocephalic and atraumatic. Sclerae are icteric. Lid and  conjunctivae within normal limits.  NECK: Supple, symmetric. No thyromegaly or adenopathy is appreciated.  CHEST: Chest shows some rhonchi bilaterally at both bases. There is no  dullness to percussion in either base.  HEART: Heart is tachycardic. Regular rhythm. No rubs, no thrills.  ABDOMEN: Abdomen is distended, soft, a few bowel sounds are heard. Liver  and spleen are not appreciated. No groin adenopathy noted.  EXTREMITIES: Examination of the extremities show 3+ edema _____  bilaterally. The patient's reflex appeared within normal limits. The  nurse reports that the patient moves all 4 extremities.  SKIN: Skin shows no rash or palpable nodules.    DIAGNOSTIC DATA: The patient's labs today show a white count 15.6,  hemoglobin is 7.7, MCV is 86.7, platelet count 122,000. The patient's  sodium is 143, potassium 3.4, chloride 106, bicarbonate 24, BUN 37,  creatinine 2.2, calcium 8.3, phosphorus 4.8, albumin 2, magnesium 1.7.  Urinalysis show specific gravity of 1.011, pH 5, no protein, moderate   blood, 25 to 50 red blood cells per high-power field.    IMPRESSION:  1. Acute renal failure.  2. Cryptogenic cirrhosis.  3. Hypotension.  4. Anemia.  5. End-stage liver disease.  6. Status post large volume paracentesis.    PLAN:  1. I am going to go ahead and give the patient some gentle intravenous  hydration. Hopefully, this will help with her hypotension. I think, with  some volume repletion, we will see some improvement in her blood pressure  as well as urinary output.  2. I will go ahead and send off urine sodium and creatinine today to  see if we can get a better handle on her volume.  3. Obviously, I am concerned about the possibility of hepatorenal  syndrome. Typically, this does not respond to volume repletion. If that  were the case, the only answer for her would be liver transplant.    The patient's prognosis is unfortunately quite poor.    Thank you very much for asking [place] to see this acutely ill patient. We will  continue to follow her closely during her hospital stay.          E-Signed By  Alver Fisher, III, M.D. 01/06/2008  12:36    Alver Fisher, III, M.D.    cc: Cyndie Chime, M.D.   Alver Fisher, III, M.D.        GEK/FI2; D: 12/31/2007 10:55 A; T: 12/31/2007 11:16 A; DOC# [id number]; Job#  [id number]

## 2008-01-01 LAB — BLOOD GAS ARTERIAL,VENT
BASE DEFICIT: 1.2 mmol/L — AB
BICARBONATE: 23 mmol/L (ref 22–26)
FIO2: 60
O2 SAT: 98 % — ABNORMAL HIGH (ref 92–97)
PCO2: 38 mmHg (ref 35–45)
PEEP/CPAP: 10
PO2: 105 mmHg — ABNORMAL HIGH (ref 80–100)
SET RATE: 26
VT/PIP: 360
pH: 7.4 (ref 7.35–7.45)

## 2008-01-01 LAB — CBC W/O DIFF
HCT: 21.6 % — ABNORMAL LOW (ref 35.0–47.0)
HGB: 7.4 g/dL — ABNORMAL LOW (ref 11.5–16.0)
MCH: 30.3 PG (ref 26.0–34.0)
MCHC: 34.3 g/dL (ref 30.0–35.0)
MCV: 88.5 FL (ref 80.0–99.0)
PLATELET: 122 10*3/uL — ABNORMAL LOW (ref 150–400)
RBC: 2.44 M/uL — ABNORMAL LOW (ref 3.80–5.20)
RDW: 22.3 % — ABNORMAL HIGH (ref 11.5–14.5)
WBC: 15.6 10*3/uL — ABNORMAL HIGH (ref 3.6–11.0)

## 2008-01-01 LAB — METABOLIC PANEL, COMPREHENSIVE
A-G Ratio: 0.5 — ABNORMAL LOW (ref 1.1–2.2)
ALT (SGPT): 35 U/L (ref 30–65)
AST (SGOT): 131 U/L — ABNORMAL HIGH (ref 15–37)
Albumin: 2 g/dL — ABNORMAL LOW (ref 3.5–5.0)
Alk. phosphatase: 158 U/L — ABNORMAL HIGH (ref 50–136)
Anion gap: 13 mmol/L (ref 5–15)
BUN/Creatinine ratio: 16 (ref 12–20)
BUN: 47 MG/DL — ABNORMAL HIGH (ref 6–20)
Bilirubin, total: 5.7 MG/DL — ABNORMAL HIGH (ref <1.0)
CO2: 22 MMOL/L (ref 21–32)
Calcium: 8.1 MG/DL — ABNORMAL LOW (ref 8.5–10.1)
Chloride: 105 MMOL/L (ref 97–108)
Creatinine: 2.9 MG/DL — ABNORMAL HIGH (ref 0.6–1.3)
GFR est AA: 22 mL/min/{1.73_m2} — ABNORMAL LOW (ref >60)
GFR est non-AA: 19 mL/min/{1.73_m2} — ABNORMAL LOW (ref >60)
Globulin: 4.1 g/dL — ABNORMAL HIGH (ref 2.0–4.0)
Glucose: 103 MG/DL — ABNORMAL HIGH (ref 50–100)
Potassium: 3.8 MMOL/L (ref 3.5–5.1)
Protein, total: 6.1 g/dL — ABNORMAL LOW (ref 6.4–8.2)
Sodium: 140 MMOL/L (ref 136–145)

## 2008-01-01 LAB — VANCOMYCIN, TROUGH
Reported dose date: 51209
Reported dose time:: 1300
Reported dose:: 125 UNITS
Vancomycin,trough: 15.3 ug/ml — CR (ref 5.0–10.0)

## 2008-01-01 LAB — MAGNESIUM: Magnesium: 1.9 MG/DL (ref 1.6–2.4)

## 2008-01-01 LAB — HGB & HCT
HCT: 22.1 % — ABNORMAL LOW (ref 35.0–47.0)
HGB: 7.6 g/dL — ABNORMAL LOW (ref 11.5–16.0)

## 2008-01-01 LAB — PROTHROMBIN TIME + INR
INR: 2 — ABNORMAL HIGH (ref 0.9–1.1)
Prothrombin time: 18.4 SECS — ABNORMAL HIGH (ref 9.0–11.0)

## 2008-01-01 LAB — PTT, ACTIVATED: aPTT: 33.7 s — ABNORMAL HIGH (ref 24.0–33.0)

## 2008-01-01 LAB — PHOSPHORUS: Phosphorus: 5.5 MG/DL — ABNORMAL HIGH (ref 2.5–4.9)

## 2008-01-01 LAB — AMMONIA: Ammonia, plasma: 59 umol/L — ABNORMAL HIGH (ref 11–32)

## 2008-01-02 LAB — TYPE + CROSSMATCH
ABO/Rh(D): A POS
Antibody screen: NEGATIVE
Status of unit: TRANSFUSED
Status of unit: TRANSFUSED

## 2008-01-02 LAB — CULTURE, RESPIRATORY/SPUTUM/BRONCH W GRAM STAIN

## 2008-01-02 LAB — CULTURE, BODY FLUID
Culture result:: NO GROWTH
GRAM STAIN: NONE SEEN

## 2008-01-02 LAB — METABOLIC PANEL, COMPREHENSIVE
A-G Ratio: 0.5 — ABNORMAL LOW (ref 1.1–2.2)
ALT (SGPT): 44 U/L (ref 30–65)
AST (SGOT): 161 U/L — ABNORMAL HIGH (ref 15–37)
Albumin: 2 g/dL — ABNORMAL LOW (ref 3.5–5.0)
Alk. phosphatase: 143 U/L — ABNORMAL HIGH (ref 50–136)
Anion gap: 15 mmol/L (ref 5–15)
BUN/Creatinine ratio: 20 (ref 12–20)
BUN: 60 MG/DL — ABNORMAL HIGH (ref 6–20)
Bilirubin, total: 5.8 MG/DL — ABNORMAL HIGH (ref <1.0)
CO2: 22 MMOL/L (ref 21–32)
Calcium: 8.2 MG/DL — ABNORMAL LOW (ref 8.5–10.1)
Chloride: 106 MMOL/L (ref 97–108)
Creatinine: 3 MG/DL — ABNORMAL HIGH (ref 0.6–1.3)
GFR est AA: 22 mL/min/{1.73_m2} — ABNORMAL LOW (ref >60)
GFR est non-AA: 18 mL/min/{1.73_m2} — ABNORMAL LOW (ref >60)
Globulin: 4.4 g/dL — ABNORMAL HIGH (ref 2.0–4.0)
Glucose: 113 MG/DL — ABNORMAL HIGH (ref 50–100)
Potassium: 4.2 MMOL/L (ref 3.5–5.1)
Protein, total: 6.4 g/dL (ref 6.4–8.2)
Sodium: 143 MMOL/L (ref 136–145)

## 2008-01-02 LAB — MAGNESIUM: Magnesium: 1.9 MG/DL (ref 1.6–2.4)

## 2008-01-02 LAB — PROTHROMBIN TIME + INR
INR: 1.9 — ABNORMAL HIGH (ref 0.9–1.1)
Prothrombin time: 17.5 SECS — ABNORMAL HIGH (ref 9.0–11.0)

## 2008-01-02 LAB — BLOOD GAS ARTERIAL,VENT
BASE DEFICIT: 2.7 mmol/L — AB
BICARBONATE: 21 mmol/L — ABNORMAL LOW (ref 22–26)
FIO2: 40
O2 SAT: 98 % — ABNORMAL HIGH (ref 92–97)
PCO2: 33 mmHg — ABNORMAL LOW (ref 35–45)
PEEP/CPAP: 10
PO2: 100 mmHg (ref 80–100)
SET RATE: 26
VT/PIP: 360
pH: 7.42 (ref 7.35–7.45)

## 2008-01-02 LAB — CBC W/O DIFF
HCT: 23.4 % — ABNORMAL LOW (ref 35.0–47.0)
HGB: 7.9 g/dL — ABNORMAL LOW (ref 11.5–16.0)
MCH: 29.9 PG (ref 26.0–34.0)
MCHC: 33.8 g/dL (ref 30.0–35.0)
MCV: 88.6 FL (ref 80.0–99.0)
PLATELET: 127 10*3/uL — ABNORMAL LOW (ref 150–400)
RBC: 2.64 M/uL — ABNORMAL LOW (ref 3.80–5.20)
RDW: 21.5 % — ABNORMAL HIGH (ref 11.5–14.5)
WBC: 13 10*3/uL — ABNORMAL HIGH (ref 3.6–11.0)

## 2008-01-02 LAB — C DIFFICILE TOXIN A & B BY EIA
C. difficile toxin A&B: NEGATIVE
C. difficile toxin A&B: NEGATIVE

## 2008-01-02 LAB — PTT, ACTIVATED: aPTT: 32.6 s (ref 24.0–33.0)

## 2008-01-02 LAB — PHOSPHORUS: Phosphorus: 5.4 MG/DL — ABNORMAL HIGH (ref 2.5–4.9)

## 2008-01-03 LAB — CBC WITH AUTOMATED DIFF
ABS. BASOPHILS: 0 10*3/uL (ref 0.0–0.1)
ABS. EOSINOPHILS: 0.4 10*3/uL (ref 0.0–0.4)
ABS. LYMPHOCYTES: 1.8 10*3/uL (ref 0.8–3.5)
ABS. MONOCYTES: 0.9 10*3/uL (ref 0–1.0)
ABS. NEUTROPHILS: 9.5 10*3/uL — ABNORMAL HIGH (ref 1.8–8.0)
BASOPHILS: 0 % (ref 0–1)
EOSINOPHILS: 3 % (ref 0–7)
HCT: 23.5 % — ABNORMAL LOW (ref 35.0–47.0)
HGB: 7.9 g/dL — ABNORMAL LOW (ref 11.5–16.0)
LYMPHOCYTES: 14 % (ref 12–49)
MCH: 30.2 PG (ref 26.0–34.0)
MCHC: 33.6 g/dL (ref 30.0–35.0)
MCV: 89.7 FL (ref 80.0–99.0)
MONOCYTES: 7 % (ref 5–13)
NEUTROPHILS: 76 % — ABNORMAL HIGH (ref 32–75)
PLATELET: 119 10*3/uL — ABNORMAL LOW (ref 150–400)
RBC: 2.62 M/uL — ABNORMAL LOW (ref 3.80–5.20)
RDW: 21.8 % — ABNORMAL HIGH (ref 11.5–14.5)
WBC: 12.6 10*3/uL — ABNORMAL HIGH (ref 3.6–11.0)

## 2008-01-03 LAB — METABOLIC PANEL, COMPREHENSIVE
A-G Ratio: 0.5 — ABNORMAL LOW (ref 1.1–2.2)
ALT (SGPT): 48 U/L (ref 30–65)
AST (SGOT): 141 U/L — ABNORMAL HIGH (ref 15–37)
Albumin: 2 g/dL — ABNORMAL LOW (ref 3.5–5.0)
Alk. phosphatase: 138 U/L — ABNORMAL HIGH (ref 50–136)
Anion gap: 9 mmol/L (ref 5–15)
BUN/Creatinine ratio: 24 — ABNORMAL HIGH (ref 12–20)
BUN: 57 MG/DL — ABNORMAL HIGH (ref 6–20)
Bilirubin, total: 4.9 MG/DL — ABNORMAL HIGH (ref <1.0)
CO2: 24 MMOL/L (ref 21–32)
Calcium: 7.9 MG/DL — ABNORMAL LOW (ref 8.5–10.1)
Chloride: 110 MMOL/L — ABNORMAL HIGH (ref 97–108)
Creatinine: 2.4 MG/DL — ABNORMAL HIGH (ref 0.6–1.3)
GFR est AA: 28 mL/min/{1.73_m2} — ABNORMAL LOW (ref >60)
GFR est non-AA: 23 mL/min/{1.73_m2} — ABNORMAL LOW (ref >60)
Globulin: 4.3 g/dL — ABNORMAL HIGH (ref 2.0–4.0)
Glucose: 121 MG/DL — ABNORMAL HIGH (ref 50–100)
Potassium: 3.4 MMOL/L — ABNORMAL LOW (ref 3.5–5.1)
Protein, total: 6.3 g/dL — ABNORMAL LOW (ref 6.4–8.2)
Sodium: 143 MMOL/L (ref 136–145)

## 2008-01-03 LAB — BLOOD GAS ARTERIAL,VENT
BASE DEFICIT: 1 mmol/L — AB
BICARBONATE: 23 mmol/L (ref 22–26)
FIO2: 40
O2 SAT: 98 % — ABNORMAL HIGH (ref 92–97)
PCO2: 36 mmHg (ref 35–45)
PEEP/CPAP: 5
PO2: 104 mmHg — ABNORMAL HIGH (ref 80–100)
SET RATE: 16
VT/PIP: 450
pH: 7.42 (ref 7.35–7.45)

## 2008-01-03 LAB — PHOSPHORUS: Phosphorus: 5.1 MG/DL — ABNORMAL HIGH (ref 2.5–4.9)

## 2008-01-03 LAB — MAGNESIUM: Magnesium: 1.9 MG/DL (ref 1.6–2.4)

## 2008-01-03 LAB — AMMONIA: Ammonia, plasma: 37 umol/L — ABNORMAL HIGH (ref 11–32)

## 2008-01-03 LAB — PROTHROMBIN TIME + INR
INR: 2 — ABNORMAL HIGH (ref 0.9–1.1)
Prothrombin time: 19.1 SECS — ABNORMAL HIGH (ref 9.0–11.0)

## 2008-01-04 LAB — PROTHROMBIN TIME + INR
INR: 2 — ABNORMAL HIGH (ref 0.9–1.1)
Prothrombin time: 18.6 SECS — ABNORMAL HIGH (ref 9.0–11.0)

## 2008-01-04 LAB — METABOLIC PANEL, COMPREHENSIVE
A-G Ratio: 0.4 — ABNORMAL LOW (ref 1.1–2.2)
ALT (SGPT): 54 U/L (ref 30–65)
AST (SGOT): 125 U/L — ABNORMAL HIGH (ref 15–37)
Albumin: 2 g/dL — ABNORMAL LOW (ref 3.5–5.0)
Alk. phosphatase: 140 U/L — ABNORMAL HIGH (ref 50–136)
Anion gap: 10 mmol/L (ref 5–15)
BUN/Creatinine ratio: 29 — ABNORMAL HIGH (ref 12–20)
BUN: 49 MG/DL — ABNORMAL HIGH (ref 6–20)
Bilirubin, total: 4.5 MG/DL — ABNORMAL HIGH (ref <1.0)
CO2: 25 MMOL/L (ref 21–32)
Calcium: 8.1 MG/DL — ABNORMAL LOW (ref 8.5–10.1)
Chloride: 114 MMOL/L — ABNORMAL HIGH (ref 97–108)
Creatinine: 1.7 MG/DL — ABNORMAL HIGH (ref 0.6–1.3)
GFR est AA: 42 mL/min/{1.73_m2} — ABNORMAL LOW (ref >60)
GFR est non-AA: 34 mL/min/{1.73_m2} — ABNORMAL LOW (ref >60)
Globulin: 4.5 g/dL — ABNORMAL HIGH (ref 2.0–4.0)
Glucose: 135 MG/DL — ABNORMAL HIGH (ref 50–100)
Potassium: 3.5 MMOL/L (ref 3.5–5.1)
Protein, total: 6.5 g/dL (ref 6.4–8.2)
Sodium: 149 MMOL/L — ABNORMAL HIGH (ref 136–145)

## 2008-01-04 LAB — BLOOD GAS ARTERIAL,VENT
BASE EXCESS: 1.6 mmol/L — AB
BICARBONATE: 26 mmol/L (ref 22–26)
FIO2: 40
O2 SAT: 98 % — ABNORMAL HIGH (ref 92–97)
PCO2: 38 mmHg (ref 35–45)
PEEP/CPAP: 5
PO2: 111 mmHg — ABNORMAL HIGH (ref 80–100)
SET RATE: 16
VT/PIP: 450
pH: 7.44 (ref 7.35–7.45)

## 2008-01-04 LAB — CULTURE, BLOOD
Culture result:: NO GROWTH
Report Status: 5162009

## 2008-01-04 LAB — CBC W/O DIFF
HCT: 24.6 % — ABNORMAL LOW (ref 35.0–47.0)
HGB: 8.2 g/dL — ABNORMAL LOW (ref 11.5–16.0)
MCH: 30.5 PG (ref 26.0–34.0)
MCHC: 33.3 g/dL (ref 30.0–35.0)
MCV: 91.4 FL (ref 80.0–99.0)
PLATELET: 128 10*3/uL — ABNORMAL LOW (ref 150–400)
RBC: 2.69 M/uL — ABNORMAL LOW (ref 3.80–5.20)
RDW: 22.5 % — ABNORMAL HIGH (ref 11.5–14.5)
WBC: 13.2 10*3/uL — ABNORMAL HIGH (ref 3.6–11.0)

## 2008-01-04 LAB — MAGNESIUM: Magnesium: 1.8 MG/DL (ref 1.6–2.4)

## 2008-01-04 LAB — CULTURE, BLOOD, PAIRED
Culture result:: NO GROWTH
Report Status: 5162009

## 2008-01-04 LAB — AMMONIA: Ammonia, plasma: 42 umol/L — ABNORMAL HIGH (ref 11–32)

## 2008-01-04 LAB — PHOSPHORUS: Phosphorus: 3.4 MG/DL (ref 2.5–4.9)

## 2008-01-05 LAB — METABOLIC PANEL, COMPREHENSIVE
A-G Ratio: 0.5 — ABNORMAL LOW (ref 1.1–2.2)
ALT (SGPT): 58 U/L (ref 30–65)
AST (SGOT): 116 U/L — ABNORMAL HIGH (ref 15–37)
Albumin: 2 g/dL — ABNORMAL LOW (ref 3.5–5.0)
Alk. phosphatase: 133 U/L (ref 50–136)
Anion gap: 8 mmol/L (ref 5–15)
BUN/Creatinine ratio: 28 — ABNORMAL HIGH (ref 12–20)
BUN: 34 MG/DL — ABNORMAL HIGH (ref 6–20)
Bilirubin, total: 4.4 MG/DL — ABNORMAL HIGH (ref <1.0)
CO2: 25 MMOL/L (ref 21–32)
Calcium: 7.8 MG/DL — ABNORMAL LOW (ref 8.5–10.1)
Chloride: 116 MMOL/L — ABNORMAL HIGH (ref 97–108)
Creatinine: 1.2 MG/DL (ref 0.6–1.3)
GFR est AA: >60 mL/min/{1.73_m2} (ref >60)
GFR est non-AA: 51 mL/min/{1.73_m2} — ABNORMAL LOW (ref >60)
Globulin: 4.3 g/dL — ABNORMAL HIGH (ref 2.0–4.0)
Glucose: 131 MG/DL — ABNORMAL HIGH (ref 50–100)
Potassium: 3.5 MMOL/L (ref 3.5–5.1)
Protein, total: 6.3 g/dL — ABNORMAL LOW (ref 6.4–8.2)
Sodium: 149 MMOL/L — ABNORMAL HIGH (ref 136–145)

## 2008-01-05 LAB — BLOOD GAS ARTERIAL,VENT
BASE EXCESS: 1.9 mmol/L — AB
BICARBONATE: 25 mmol/L (ref 22–26)
FIO2: 40
O2 SAT: 98 % — ABNORMAL HIGH (ref 92–97)
PCO2: 36 mmHg (ref 35–45)
PEEP/CPAP: 5
PO2: 110 mmHg — ABNORMAL HIGH (ref 80–100)
PRESSURE SUPPORT: 20
SET RATE: 8
VT/PIP: 500
pH: 7.47 — ABNORMAL HIGH (ref 7.35–7.45)

## 2008-01-05 LAB — CBC W/O DIFF
HCT: 25.3 % — ABNORMAL LOW (ref 35.0–47.0)
HGB: 8.3 g/dL — ABNORMAL LOW (ref 11.5–16.0)
MCH: 30.7 PG (ref 26.0–34.0)
MCHC: 32.8 g/dL (ref 30.0–35.0)
MCV: 93.7 FL (ref 80.0–99.0)
PLATELET: 130 10*3/uL — ABNORMAL LOW (ref 150–400)
RBC: 2.7 M/uL — ABNORMAL LOW (ref 3.80–5.20)
RDW: 23.3 % — ABNORMAL HIGH (ref 11.5–14.5)
WBC: 13.5 10*3/uL — ABNORMAL HIGH (ref 3.6–11.0)

## 2008-01-06 LAB — METABOLIC PANEL, COMPREHENSIVE
A-G Ratio: 0.5 — ABNORMAL LOW (ref 1.1–2.2)
ALT (SGPT): 60 U/L (ref 30–65)
AST (SGOT): 93 U/L — ABNORMAL HIGH (ref 15–37)
Albumin: 2.1 g/dL — ABNORMAL LOW (ref 3.5–5.0)
Alk. phosphatase: 125 U/L (ref 50–136)
Anion gap: 8 mmol/L (ref 5–15)
BUN/Creatinine ratio: 26 — ABNORMAL HIGH (ref 12–20)
BUN: 26 MG/DL — ABNORMAL HIGH (ref 6–20)
Bilirubin, total: 4.7 MG/DL — ABNORMAL HIGH (ref <1.0)
CO2: 26 MMOL/L (ref 21–32)
Calcium: 7.8 MG/DL — ABNORMAL LOW (ref 8.5–10.1)
Chloride: 119 MMOL/L — ABNORMAL HIGH (ref 97–108)
Creatinine: 1 MG/DL (ref 0.6–1.3)
GFR est AA: >60 mL/min/{1.73_m2} (ref >60)
GFR est non-AA: >60 mL/min/{1.73_m2} (ref >60)
Globulin: 4.3 g/dL — ABNORMAL HIGH (ref 2.0–4.0)
Glucose: 141 MG/DL — ABNORMAL HIGH (ref 50–100)
Potassium: 3.7 MMOL/L (ref 3.5–5.1)
Protein, total: 6.4 g/dL (ref 6.4–8.2)
Sodium: 153 MMOL/L — ABNORMAL HIGH (ref 136–145)

## 2008-01-06 LAB — BLOOD GAS ARTERIAL,VENT
BASE EXCESS: 0.8 mmol/L — AB
BICARBONATE: 26 mmol/L (ref 22–26)
FIO2: 35
O2 SAT: 98 % — ABNORMAL HIGH (ref 92–97)
PCO2: 42 mmHg (ref 35–45)
PEEP/CPAP: 5
PO2: 99 mmHg (ref 80–100)
PRESSURE SUPPORT: 20
SET RATE: 6
VT/PIP: 500
pH: 7.41 (ref 7.35–7.45)

## 2008-01-06 LAB — GRAM STAIN

## 2008-01-06 LAB — CBC W/O DIFF
HCT: 24.9 % — ABNORMAL LOW (ref 35.0–47.0)
HGB: 7.9 g/dL — ABNORMAL LOW (ref 11.5–16.0)
MCH: 30.7 PG (ref 26.0–34.0)
MCHC: 31.7 g/dL (ref 30.0–35.0)
MCV: 96.9 FL (ref 80.0–99.0)
PLATELET: 138 10*3/uL — ABNORMAL LOW (ref 150–400)
RBC: 2.57 M/uL — ABNORMAL LOW (ref 3.80–5.20)
RDW: 24.1 % — ABNORMAL HIGH (ref 11.5–14.5)
WBC: 16.1 10*3/uL — ABNORMAL HIGH (ref 3.6–11.0)

## 2008-01-07 LAB — METABOLIC PANEL, COMPREHENSIVE
A-G Ratio: 0.5 — ABNORMAL LOW (ref 1.1–2.2)
ALT (SGPT): 58 U/L (ref 30–65)
AST (SGOT): 76 U/L — ABNORMAL HIGH (ref 15–37)
Albumin: 2.1 g/dL — ABNORMAL LOW (ref 3.5–5.0)
Alk. phosphatase: 133 U/L (ref 50–136)
Anion gap: 7 mmol/L (ref 5–15)
BUN/Creatinine ratio: 23 — ABNORMAL HIGH (ref 12–20)
BUN: 18 MG/DL (ref 6–20)
Bilirubin, total: 4.1 MG/DL — ABNORMAL HIGH (ref <1.0)
CO2: 25 MMOL/L (ref 21–32)
Calcium: 7.7 MG/DL — ABNORMAL LOW (ref 8.5–10.1)
Chloride: 114 MMOL/L — ABNORMAL HIGH (ref 97–108)
Creatinine: 0.8 MG/DL (ref 0.6–1.3)
GFR est AA: >60 mL/min/{1.73_m2} (ref >60)
GFR est non-AA: >60 mL/min/{1.73_m2} (ref >60)
Globulin: 4.3 g/dL — ABNORMAL HIGH (ref 2.0–4.0)
Glucose: 139 MG/DL — ABNORMAL HIGH (ref 50–100)
Potassium: 3.6 MMOL/L (ref 3.5–5.1)
Protein, total: 6.4 g/dL (ref 6.4–8.2)
Sodium: 146 MMOL/L — ABNORMAL HIGH (ref 136–145)

## 2008-01-07 LAB — BLOOD GAS ARTERIAL,VENT
BASE DEFICIT: 0.3 mmol/L — AB
BICARBONATE: 23 mmol/L (ref 22–26)
FIO2: 35
O2 SAT: 98 % — ABNORMAL HIGH (ref 92–97)
PCO2: 35 mmHg (ref 35–45)
PEEP/CPAP: 5
PO2: 99 mmHg (ref 80–100)
PRESSURE SUPPORT: 20
SET RATE: 6
SPONTANEOUS RATE: 20
VT/PIP: 500
pH: 7.44 (ref 7.35–7.45)

## 2008-01-07 LAB — CBC W/O DIFF
HCT: 28.6 % — ABNORMAL LOW (ref 35.0–47.0)
HGB: 8.9 g/dL — ABNORMAL LOW (ref 11.5–16.0)
MCH: 30 PG (ref 26.0–34.0)
MCHC: 31.1 g/dL (ref 30.0–35.0)
MCV: 96.3 FL (ref 80.0–99.0)
PLATELET: 172 10*3/uL (ref 150–400)
RBC: 2.97 M/uL — ABNORMAL LOW (ref 3.80–5.20)
RDW: 24.3 % — ABNORMAL HIGH (ref 11.5–14.5)
WBC: 13.9 10*3/uL — ABNORMAL HIGH (ref 3.6–11.0)

## 2008-01-07 LAB — CULTURE, RESPIRATORY/SPUTUM/BRONCH W GRAM STAIN: Culture result:: NORMAL

## 2008-01-07 LAB — TYPE + CROSSMATCH
ABO/Rh(D): A POS
Antibody screen: NEGATIVE
Status of unit: TRANSFUSED

## 2008-01-07 LAB — VANCOMYCIN, TROUGH
Reported dose date: 51809
Reported dose time:: 0
Reported dose:: 0 UNITS
Vancomycin,trough: 17.8 ug/ml — CR (ref 5.0–10.0)

## 2008-01-07 LAB — MAGNESIUM: Magnesium: 1.4 MG/DL — ABNORMAL LOW (ref 1.6–2.4)

## 2008-01-08 LAB — CBC W/O DIFF
HCT: 28.8 % — ABNORMAL LOW (ref 35.0–47.0)
HGB: 9.1 g/dL — ABNORMAL LOW (ref 11.5–16.0)
MCH: 30.3 PG (ref 26.0–34.0)
MCHC: 31.6 g/dL (ref 30.0–35.0)
MCV: 96 FL (ref 80.0–99.0)
PLATELET: 195 10*3/uL (ref 150–400)
RBC: 3 M/uL — ABNORMAL LOW (ref 3.80–5.20)
RDW: 24.1 % — ABNORMAL HIGH (ref 11.5–14.5)
WBC: 12.4 10*3/uL — ABNORMAL HIGH (ref 3.6–11.0)

## 2008-01-08 LAB — BLOOD GAS ARTERIAL,VENT
BASE EXCESS: 4.1 mmol/L — AB
BICARBONATE: 28 mmol/L — ABNORMAL HIGH (ref 22–26)
FIO2: 100
O2 SAT: 100 % — ABNORMAL HIGH (ref 92–97)
PCO2: 41 mmHg (ref 35–45)
PEEP/CPAP: 5
PO2: 351 mmHg — ABNORMAL HIGH (ref 80–100)
PRESSURE SUPPORT: 20
SET RATE: 10
SPONTANEOUS RATE: 22
VT/PIP: 500
pH: 7.46 — ABNORMAL HIGH (ref 7.35–7.45)

## 2008-01-08 LAB — METABOLIC PANEL, COMPREHENSIVE
A-G Ratio: 0.5 — ABNORMAL LOW (ref 1.1–2.2)
ALT (SGPT): 53 U/L (ref 30–65)
AST (SGOT): 65 U/L — ABNORMAL HIGH (ref 15–37)
Albumin: 2.1 g/dL — ABNORMAL LOW (ref 3.5–5.0)
Alk. phosphatase: 128 U/L (ref 50–136)
Anion gap: 8 mmol/L (ref 5–15)
BUN/Creatinine ratio: 19 (ref 12–20)
BUN: 15 MG/DL (ref 6–20)
Bilirubin, total: 4 MG/DL — ABNORMAL HIGH (ref <1.0)
CO2: 29 MMOL/L (ref 21–32)
Calcium: 7.5 MG/DL — ABNORMAL LOW (ref 8.5–10.1)
Chloride: 111 MMOL/L — ABNORMAL HIGH (ref 97–108)
Creatinine: 0.8 MG/DL (ref 0.6–1.3)
GFR est AA: >60 mL/min/{1.73_m2} (ref >60)
GFR est non-AA: >60 mL/min/{1.73_m2} (ref >60)
Globulin: 4.2 g/dL — ABNORMAL HIGH (ref 2.0–4.0)
Glucose: 117 MG/DL — ABNORMAL HIGH (ref 50–100)
Potassium: 2.7 MMOL/L — CL (ref 3.5–5.1)
Protein, total: 6.3 g/dL — ABNORMAL LOW (ref 6.4–8.2)
Sodium: 148 MMOL/L — ABNORMAL HIGH (ref 136–145)

## 2008-01-09 LAB — METABOLIC PANEL, COMPREHENSIVE
A-G Ratio: 0.5 — ABNORMAL LOW (ref 1.1–2.2)
ALT (SGPT): 51 U/L (ref 30–65)
AST (SGOT): 56 U/L — ABNORMAL HIGH (ref 15–37)
Albumin: 2.1 g/dL — ABNORMAL LOW (ref 3.5–5.0)
Alk. phosphatase: 116 U/L (ref 50–136)
Anion gap: 8 mmol/L (ref 5–15)
BUN/Creatinine ratio: 16 (ref 12–20)
BUN: 13 MG/DL (ref 6–20)
Bilirubin, total: 4.3 MG/DL — ABNORMAL HIGH (ref <1.0)
CO2: 32 MMOL/L (ref 21–32)
Calcium: 8.1 MG/DL — ABNORMAL LOW (ref 8.5–10.1)
Chloride: 113 MMOL/L — ABNORMAL HIGH (ref 97–108)
Creatinine: 0.8 MG/DL (ref 0.6–1.3)
GFR est AA: >60 mL/min/{1.73_m2} (ref >60)
GFR est non-AA: >60 mL/min/{1.73_m2} (ref >60)
Globulin: 4.3 g/dL — ABNORMAL HIGH (ref 2.0–4.0)
Glucose: 98 MG/DL (ref 50–100)
Potassium: 3.3 MMOL/L — ABNORMAL LOW (ref 3.5–5.1)
Protein, total: 6.4 g/dL (ref 6.4–8.2)
Sodium: 153 MMOL/L — ABNORMAL HIGH (ref 136–145)

## 2008-01-09 LAB — CBC W/O DIFF
HCT: 27.7 % — ABNORMAL LOW (ref 35.0–47.0)
HGB: 8.8 g/dL — ABNORMAL LOW (ref 11.5–16.0)
MCH: 31.2 PG (ref 26.0–34.0)
MCHC: 31.8 g/dL (ref 30.0–35.0)
MCV: 98.2 FL (ref 80.0–99.0)
PLATELET: 211 10*3/uL (ref 150–400)
RBC: 2.82 M/uL — ABNORMAL LOW (ref 3.80–5.20)
RDW: 23.9 % — ABNORMAL HIGH (ref 11.5–14.5)
WBC: 10.2 10*3/uL (ref 3.6–11.0)

## 2008-01-09 LAB — BLOOD GAS ARTERIAL,VENT
BASE EXCESS: 7.8 mmol/L — AB
BICARBONATE: 33 mmol/L — ABNORMAL HIGH (ref 22–26)
FIO2: 35 %
O2 SAT: 98 % — ABNORMAL HIGH (ref 92–97)
PCO2: 46 mmHg — ABNORMAL HIGH (ref 35–45)
PEEP/CPAP: 5
PO2: 97 mmHg (ref 80–100)
PRESSURE SUPPORT: 20
SET RATE: 6
VT/PIP: 600
pH: 7.47 — ABNORMAL HIGH (ref 7.35–7.45)

## 2008-01-09 LAB — AMMONIA: Ammonia, plasma: 30 umol/L (ref 11–32)

## 2008-01-10 LAB — CELL COUNT, BODY FLUID
FLD LYMPHS: 47 %
FLD MONO/MACROPHAGES: 43 %
FLD NEUTROPHILS: 4 %
FLUID MESOTHELIAL: 6 %
FLUID RBC CT.: >100 /mm3
FLUID WBC COUNT: 185 /mm3 — ABNORMAL HIGH (ref 0–5)

## 2008-01-10 LAB — BLOOD GAS ARTERIAL,VENT
BASE EXCESS: 14.4 mmol/L — AB
BICARBONATE: 40 mmol/L — ABNORMAL HIGH (ref 22–26)
FIO2: 35 %
O2 SAT: 98 % — ABNORMAL HIGH (ref 92–97)
PCO2: 53 mmHg — ABNORMAL HIGH (ref 35–45)
PEEP/CPAP: 5
PO2: 105 mmHg — ABNORMAL HIGH (ref 80–100)
PRESSURE SUPPORT: 20
SET RATE: 6
VT/PIP: 500
pH: 7.5 — ABNORMAL HIGH (ref 7.35–7.45)

## 2008-01-10 LAB — METABOLIC PANEL, COMPREHENSIVE
A-G Ratio: 0.5 — ABNORMAL LOW (ref 1.1–2.2)
ALT (SGPT): 48 U/L (ref 30–65)
AST (SGOT): 50 U/L — ABNORMAL HIGH (ref 15–37)
Albumin: 2.1 g/dL — ABNORMAL LOW (ref 3.5–5.0)
Alk. phosphatase: 111 U/L (ref 50–136)
Anion gap: 5 mmol/L (ref 5–15)
BUN/Creatinine ratio: 14 (ref 12–20)
BUN: 13 MG/DL (ref 6–20)
Bilirubin, total: 3.7 MG/DL — ABNORMAL HIGH (ref <1.0)
CO2: 37 MMOL/L — ABNORMAL HIGH (ref 21–32)
Calcium: 7.6 MG/DL — ABNORMAL LOW (ref 8.5–10.1)
Chloride: 108 MMOL/L (ref 97–108)
Creatinine: 0.9 MG/DL (ref 0.6–1.3)
GFR est AA: >60 mL/min/{1.73_m2} (ref >60)
GFR est non-AA: >60 mL/min/{1.73_m2} (ref >60)
Globulin: 4.3 g/dL — ABNORMAL HIGH (ref 2.0–4.0)
Glucose: 123 MG/DL — ABNORMAL HIGH (ref 50–100)
Potassium: 2.6 MMOL/L — CL (ref 3.5–5.1)
Protein, total: 6.4 g/dL (ref 6.4–8.2)
Sodium: 150 MMOL/L — ABNORMAL HIGH (ref 136–145)

## 2008-01-10 LAB — VANCOMYCIN, TROUGH
Reported dose date: 52109
Reported dose time:: 1330
Vancomycin,trough: 20 ug/ml — CR (ref 5.0–10.0)

## 2008-01-10 LAB — PTT, ACTIVATED: aPTT: 28.3 s (ref 24.0–33.0)

## 2008-01-10 LAB — CBC W/O DIFF
HCT: 27.3 % — ABNORMAL LOW (ref 35.0–47.0)
HGB: 8.5 g/dL — ABNORMAL LOW (ref 11.5–16.0)
MCH: 30.9 PG (ref 26.0–34.0)
MCHC: 31.1 g/dL (ref 30.0–35.0)
MCV: 99.3 FL — ABNORMAL HIGH (ref 80.0–99.0)
PLATELET: 221 10*3/uL (ref 150–400)
RBC: 2.75 M/uL — ABNORMAL LOW (ref 3.80–5.20)
RDW: 23.6 % — ABNORMAL HIGH (ref 11.5–14.5)
WBC: 10.3 10*3/uL (ref 3.6–11.0)

## 2008-01-10 LAB — PROTHROMBIN TIME + INR
INR: 1.5 — ABNORMAL HIGH (ref 0.9–1.1)
Prothrombin time: 14.1 SECS — ABNORMAL HIGH (ref 9.0–11.0)

## 2008-01-10 LAB — MAGNESIUM: Magnesium: 1 MG/DL — ABNORMAL LOW (ref 1.6–2.4)

## 2008-01-11 LAB — METABOLIC PANEL, BASIC
Anion gap: 4 mmol/L — ABNORMAL LOW (ref 5–15)
BUN/Creatinine ratio: 14 (ref 12–20)
BUN: 11 MG/DL (ref 6–20)
CO2: 42 MMOL/L — CR (ref 21–32)
Calcium: 7.9 MG/DL — ABNORMAL LOW (ref 8.5–10.1)
Chloride: 104 MMOL/L (ref 97–108)
Creatinine: 0.8 MG/DL (ref 0.6–1.3)
GFR est AA: >60 mL/min/{1.73_m2} (ref >60)
GFR est non-AA: >60 mL/min/{1.73_m2} (ref >60)
Glucose: 114 MG/DL — ABNORMAL HIGH (ref 50–100)
Potassium: 2.9 MMOL/L — ABNORMAL LOW (ref 3.5–5.1)
Sodium: 150 MMOL/L — ABNORMAL HIGH (ref 136–145)

## 2008-01-11 LAB — BLOOD GAS ARTERIAL,VENT
BASE EXCESS: 19 mmol/L — AB
BICARBONATE: 46 mmol/L — ABNORMAL HIGH (ref 22–26)
O2 FLOW RATE: 3 L/min
O2 SAT: 96 % (ref 92–97)
PCO2: 60 mmHg — CR (ref 35–45)
PO2: 73 mmHg — ABNORMAL LOW (ref 80–100)
SPONTANEOUS RATE: 20
pH: 7.5 — ABNORMAL HIGH (ref 7.35–7.45)

## 2008-01-11 LAB — CBC W/O DIFF
HCT: 27.2 % — ABNORMAL LOW (ref 35.0–47.0)
HGB: 8.5 g/dL — ABNORMAL LOW (ref 11.5–16.0)
MCH: 31.1 PG (ref 26.0–34.0)
MCHC: 31.3 g/dL (ref 30.0–35.0)
MCV: 99.6 FL — ABNORMAL HIGH (ref 80.0–99.0)
PLATELET: 185 10*3/uL (ref 150–400)
RBC: 2.73 M/uL — ABNORMAL LOW (ref 3.80–5.20)
RDW: 23.5 % — ABNORMAL HIGH (ref 11.5–14.5)
WBC: 8.3 10*3/uL (ref 3.6–11.0)

## 2008-01-11 LAB — MAGNESIUM: Magnesium: 1.6 MG/DL (ref 1.6–2.4)

## 2008-01-12 LAB — METABOLIC PANEL, COMPREHENSIVE
A-G Ratio: 0.9 — ABNORMAL LOW (ref 1.1–2.2)
ALT (SGPT): 38 U/L (ref 30–65)
AST (SGOT): 36 U/L (ref 15–37)
Albumin: 3.1 g/dL — ABNORMAL LOW (ref 3.5–5.0)
Alk. phosphatase: 92 U/L (ref 50–136)
Anion gap: 5 mmol/L (ref 5–15)
BUN/Creatinine ratio: 11 — ABNORMAL LOW (ref 12–20)
BUN: 10 MG/DL (ref 6–20)
Bilirubin, total: 5 MG/DL — ABNORMAL HIGH (ref <1.0)
CO2: 40 MMOL/L — ABNORMAL HIGH (ref 21–32)
Calcium: 8.4 MG/DL — ABNORMAL LOW (ref 8.5–10.1)
Chloride: 103 MMOL/L (ref 97–108)
Creatinine: 0.9 MG/DL (ref 0.6–1.3)
GFR est AA: >60 mL/min/{1.73_m2} (ref >60)
GFR est non-AA: >60 mL/min/{1.73_m2} (ref >60)
Globulin: 3.4 g/dL (ref 2.0–4.0)
Glucose: 96 MG/DL (ref 50–100)
Potassium: 2.7 MMOL/L — CL (ref 3.5–5.1)
Protein, total: 6.5 g/dL (ref 6.4–8.2)
Sodium: 148 MMOL/L — ABNORMAL HIGH (ref 136–145)

## 2008-01-12 LAB — AMMONIA: Ammonia, plasma: 36 umol/L — ABNORMAL HIGH (ref 11–32)

## 2008-01-12 LAB — MAGNESIUM: Magnesium: 2.1 MG/DL (ref 1.6–2.4)

## 2008-01-13 LAB — METABOLIC PANEL, COMPREHENSIVE
A-G Ratio: 0.9 — ABNORMAL LOW (ref 1.1–2.2)
ALT (SGPT): 37 U/L (ref 30–65)
AST (SGOT): 34 U/L (ref 15–37)
Albumin: 3 g/dL — ABNORMAL LOW (ref 3.5–5.0)
Alk. phosphatase: 111 U/L (ref 50–136)
Anion gap: 6 mmol/L (ref 5–15)
BUN/Creatinine ratio: 10 — ABNORMAL LOW (ref 12–20)
BUN: 11 MG/DL (ref 6–20)
Bilirubin, total: 4.6 MG/DL — ABNORMAL HIGH (ref <1.0)
CO2: 38 MMOL/L — ABNORMAL HIGH (ref 21–32)
Calcium: 8.5 MG/DL (ref 8.5–10.1)
Chloride: 101 MMOL/L (ref 97–108)
Creatinine: 1.1 MG/DL (ref 0.6–1.3)
GFR est AA: >60 mL/min/{1.73_m2} (ref >60)
GFR est non-AA: 57 mL/min/{1.73_m2} — ABNORMAL LOW (ref >60)
Globulin: 3.5 g/dL (ref 2.0–4.0)
Glucose: 97 MG/DL (ref 50–100)
Potassium: 2.4 MMOL/L — CL (ref 3.5–5.1)
Protein, total: 6.5 g/dL (ref 6.4–8.2)
Sodium: 145 MMOL/L (ref 136–145)

## 2008-01-13 LAB — CBC W/O DIFF
HCT: 28.5 % — ABNORMAL LOW (ref 35.0–47.0)
HGB: 8.9 g/dL — ABNORMAL LOW (ref 11.5–16.0)
MCH: 31.2 PG (ref 26.0–34.0)
MCHC: 31.2 g/dL (ref 30.0–35.0)
MCV: 100 FL — ABNORMAL HIGH (ref 80.0–99.0)
PLATELET: 172 10*3/uL (ref 150–400)
RBC: 2.85 M/uL — ABNORMAL LOW (ref 3.80–5.20)
RDW: 23.2 % — ABNORMAL HIGH (ref 11.5–14.5)
WBC: 8.6 10*3/uL (ref 3.6–11.0)

## 2008-01-13 LAB — AMMONIA: Ammonia, plasma: 33 umol/L — ABNORMAL HIGH (ref 11–32)

## 2008-01-14 LAB — METABOLIC PANEL, COMPREHENSIVE
A-G Ratio: 0.9 — ABNORMAL LOW (ref 1.1–2.2)
ALT (SGPT): 37 U/L (ref 30–65)
AST (SGOT): 36 U/L (ref 15–37)
Albumin: 3.3 g/dL — ABNORMAL LOW (ref 3.5–5.0)
Alk. phosphatase: 106 U/L (ref 50–136)
Anion gap: 7 mmol/L (ref 5–15)
BUN/Creatinine ratio: 11 — ABNORMAL LOW (ref 12–20)
BUN: 11 MG/DL (ref 6–20)
Bilirubin, total: 4.4 MG/DL — ABNORMAL HIGH (ref <1.0)
CO2: 35 MMOL/L — ABNORMAL HIGH (ref 21–32)
Calcium: 9.1 MG/DL (ref 8.5–10.1)
Chloride: 102 MMOL/L (ref 97–108)
Creatinine: 1 MG/DL (ref 0.6–1.3)
GFR est AA: >60 mL/min/{1.73_m2} (ref >60)
GFR est non-AA: >60 mL/min/{1.73_m2} (ref >60)
Globulin: 3.8 g/dL (ref 2.0–4.0)
Glucose: 91 MG/DL (ref 50–100)
Potassium: 3.6 MMOL/L (ref 3.5–5.1)
Protein, total: 7.1 g/dL (ref 6.4–8.2)
Sodium: 144 MMOL/L (ref 136–145)

## 2008-01-14 LAB — MAGNESIUM: Magnesium: 1.8 MG/DL (ref 1.6–2.4)

## 2008-01-14 LAB — CBC W/O DIFF
HCT: 29.8 % — ABNORMAL LOW (ref 35.0–47.0)
HGB: 9.4 g/dL — ABNORMAL LOW (ref 11.5–16.0)
MCH: 31.5 PG (ref 26.0–34.0)
MCHC: 31.5 g/dL (ref 30.0–35.0)
MCV: 100 FL — ABNORMAL HIGH (ref 80.0–99.0)
PLATELET: 163 10*3/uL (ref 150–400)
RBC: 2.98 M/uL — ABNORMAL LOW (ref 3.80–5.20)
RDW: 23 % — ABNORMAL HIGH (ref 11.5–14.5)
WBC: 8.4 10*3/uL (ref 3.6–11.0)

## 2008-01-15 LAB — METABOLIC PANEL, COMPREHENSIVE
A-G Ratio: 0.9 — ABNORMAL LOW (ref 1.1–2.2)
ALT (SGPT): 34 U/L (ref 30–65)
AST (SGOT): 42 U/L — ABNORMAL HIGH (ref 15–37)
Albumin: 3 g/dL — ABNORMAL LOW (ref 3.5–5.0)
Alk. phosphatase: 108 U/L (ref 50–136)
Anion gap: 6 mmol/L (ref 5–15)
BUN/Creatinine ratio: 8 — ABNORMAL LOW (ref 12–20)
BUN: 10 MG/DL (ref 6–20)
Bilirubin, total: 4 MG/DL — ABNORMAL HIGH (ref <1.0)
CO2: 33 MMOL/L — ABNORMAL HIGH (ref 21–32)
Calcium: 8.6 MG/DL (ref 8.5–10.1)
Chloride: 95 MMOL/L — ABNORMAL LOW (ref 97–108)
Creatinine: 1.2 MG/DL (ref 0.6–1.3)
GFR est AA: >60 mL/min/{1.73_m2} (ref >60)
GFR est non-AA: 51 mL/min/{1.73_m2} — ABNORMAL LOW (ref >60)
Globulin: 3.5 g/dL (ref 2.0–4.0)
Glucose: 84 MG/DL (ref 50–100)
Potassium: 3.3 MMOL/L — ABNORMAL LOW (ref 3.5–5.1)
Protein, total: 6.5 g/dL (ref 6.4–8.2)
Sodium: 134 MMOL/L — ABNORMAL LOW (ref 136–145)

## 2008-01-15 LAB — MAGNESIUM: Magnesium: 1.5 MG/DL — ABNORMAL LOW (ref 1.6–2.4)

## 2008-01-16 LAB — METABOLIC PANEL, BASIC
Anion gap: 9 mmol/L (ref 5–15)
BUN/Creatinine ratio: 10 — ABNORMAL LOW (ref 12–20)
BUN: 12 MG/DL (ref 6–20)
CO2: 31 MMOL/L (ref 21–32)
Calcium: 8.9 MG/DL (ref 8.5–10.1)
Chloride: 97 MMOL/L (ref 97–108)
Creatinine: 1.2 MG/DL (ref 0.6–1.3)
GFR est AA: >60 mL/min/{1.73_m2} (ref >60)
GFR est non-AA: 51 mL/min/{1.73_m2} — ABNORMAL LOW (ref >60)
Glucose: 95 MG/DL (ref 50–100)
Potassium: 3.8 MMOL/L (ref 3.5–5.1)
Sodium: 137 MMOL/L (ref 136–145)

## 2008-01-16 LAB — CULTURE, BODY FLUID

## 2008-01-16 LAB — MAGNESIUM: Magnesium: 1.8 MG/DL (ref 1.6–2.4)

## 2008-01-17 NOTE — Discharge Summary (Signed)
Name: Breanna Lester, Breanna Lester Admitted: 12/28/2007  MR #: [id number] Discharged: 01/17/2008  Account #: [id number] DOB: 28-May-1961  Physician: Pamalee Leyden, MD Age 47     DISCHARGE SUMMARY        FINAL DIAGNOSES:  1. Acute hypoxic respiratory failure.  2. Pleural effusion.  3. Volume overload with ascites and lower extremity edema.  4. End-stage cryptogenic cirrhosis.  5. Acute renal failure, resolved.  6. Anemia.  7. Coagulopathy.  8. Persistent hypotension.  9. Hypokalemia.  10. Anemia.  11. Deconditioned state.  12. Sepsis syndrome.  13. Dysphagia.  14. Methicillin-resistant Staphylococcus aureus pneumonia.  15. Pulmonary edema.  16. Adult respiratory distress syndrome-type syndrome.    CONSULTATION:  1. Gastroenterology, Dr. Sandie Ano and later followed by [name].  2. Pulmonary Critical Care, Dr. Rivka Spring.  3. Nephrology, [name].    PROCEDURES/TESTS:  1. Intubation and mechanical ventilation.  2. Large volume ultrasound-guided paracentesis with insertion of a  temporary peritoneal catheter for frequent drainage.  3. Doppler ultrasound, negative for DVT.  4. Abdominal ultrasound remarkable for massive amounts of ascites,  evidence for splenomegaly and echogenic liver.  5. Echocardiogram demonstrates normal systolic function, ejection  fraction 60%.    REASON FOR ADMISSION: This is a 47 year old female with a history of  cryptogenic cirrhosis followed by [name], who was recently here in the  hospital last April for anasarca/ascites and now presents with shortness of  breath. Initial chest x-ray demonstrates effusions and right-sided  pneumonia. The patient was initially hypoxic with a room air saturation of  87%. She was subsequently admitted for further treatment.    HOSPITAL COURSE: This patient had a prolonged hospital course, spending  close to 3 weeks. She initially presented with hypoxemia secondary to  pneumonia and pleural effusions. She deteriorated and required intubation   for progressive respiratory failure. She went into acute renal failure and  was transferred to the ICU. The patient eventually grew MRSA in her sputum  and completed a course of vancomycin and was also maintained on  broad-spectrum antibiotics in the form of Doribax for clinical sepsis-type  syndrome. The patient eventually recovered, got extubated, and her renal  function reverted back to normal. Large-volume paracentesis was performed  using a peritoneal catheter until the catheter was accidentally pulled out.  The patient remained relatively hypotensive, which prevented further  titration of her diuretics. Her mental status slowly recovered; however,  she remained quite deconditioned and required transfer to a skilled nursing  facility for further gait training and strengthening.    DISCHARGE INSTRUCTIONS:  DIET: Low salt.    DISCHARGE ACTIVITY: PT/OT consult.    DISCHARGE MEDICATIONS:  1. Bumex 1 mg every morning.  2. K-Dur 20 mEq every morning.  3. Aldactone 50 mg every morning.  4. Lactulose 20g/30 mL daily.  5. Levaquin 500 mg daily for 4 more days.    DISCHARGE CONDITION: Improved and stable.    FOLLOWUP:  1. BMP in 1 week.  2. The patient is transferring to [facility] for further  rehabilitation.  3. Follow up with [name] in about 3 weeks after discharge from  the health care facility.  4. Follow up with Dr. Dr. Doree Fudge, please call for your own  appointments.    Greater than 30 minutes was spent coordinating today's discharge.            E-Signed By  Pamalee Leyden, MD 01/21/2008 03:48    Pamalee Leyden, MD  cc: Doree Fudge, M.D.   [name], MD   Alver Fisher, III, M.D.   Gaspar Skeeters Schenkein, MD   Pamalee Leyden, MD      COPY TO: [facility].      JLT/FI2; D: 01/17/2008 3:00 P; T: 01/17/2008 7:58 P; DOC# [id number]; Job#  [id number]

## 2008-01-29 LAB — AMYLASE: Amylase: 119 U/L — ABNORMAL HIGH (ref 25–115)

## 2008-01-29 LAB — METABOLIC PANEL, COMPREHENSIVE
A-G Ratio: 0.7 — ABNORMAL LOW (ref 1.1–2.2)
ALT (SGPT): 35 U/L (ref 30–65)
AST (SGOT): 54 U/L — ABNORMAL HIGH (ref 15–37)
Albumin: 3 g/dL — ABNORMAL LOW (ref 3.5–5.0)
Alk. phosphatase: 294 U/L — ABNORMAL HIGH (ref 50–136)
Anion gap: 7 mmol/L (ref 5–15)
BUN/Creatinine ratio: 8 — ABNORMAL LOW (ref 12–20)
BUN: 6 mg/dL (ref 6–20)
Bilirubin, total: 4.1 MG/DL — ABNORMAL HIGH (ref <1.0)
CO2: 32 MMOL/L (ref 21–32)
Calcium: 8.6 MG/DL (ref 8.5–10.1)
Chloride: 101 MMOL/L (ref 97–108)
Creatinine: 0.8 mg/dL (ref 0.6–1.3)
GFR est AA: >60 mL/min/{1.73_m2} (ref >60)
GFR est non-AA: >60 mL/min/{1.73_m2} (ref >60)
Globulin: 4.1 g/dL — ABNORMAL HIGH (ref 2.0–4.0)
Glucose: 94 MG/DL (ref 50–100)
Potassium: 3.4 MMOL/L — ABNORMAL LOW (ref 3.5–5.1)
Protein, total: 7.1 g/dL (ref 6.4–8.2)
Sodium: 140 MMOL/L (ref 136–145)

## 2008-01-29 LAB — URINALYSIS W/ REFLEX CULTURE
Blood: NEGATIVE
Glucose: NEGATIVE MG/DL
Ketone: NEGATIVE MG/DL
Leukocyte Esterase: NEGATIVE
Nitrites: NEGATIVE
Protein: NEGATIVE MG/DL
Specific gravity: 1.016 (ref 1.003–1.030)
Urobilinogen: 1 EU/DL (ref 0.2–1.0)
pH (UA): 6 (ref 5.0–8.0)

## 2008-01-29 LAB — CBC WITH AUTOMATED DIFF
ABS. BASOPHILS: 0.1 10*3/uL (ref 0.0–0.1)
ABS. EOSINOPHILS: 0.1 10*3/uL (ref 0.0–0.4)
ABS. LYMPHOCYTES: 3.2 10*3/uL (ref 0.8–3.5)
ABS. MONOCYTES: 0.7 10*3/uL (ref 0–1.0)
ABS. NEUTROPHILS: 7.3 10*3/uL (ref 1.8–8.0)
BASOPHILS: 1 % (ref 0–1)
EOSINOPHILS: 1 % (ref 0–7)
HCT: 26.5 % — ABNORMAL LOW (ref 35.0–47.0)
HGB: 9 g/dL — ABNORMAL LOW (ref 11.5–16.0)
LYMPHOCYTES: 28 % (ref 12–49)
MCH: 30.7 PG (ref 26.0–34.0)
MCHC: 34 g/dL (ref 30.0–35.0)
MCV: 90.4 FL (ref 80.0–99.0)
MONOCYTES: 6 % (ref 5–13)
NEUTROPHILS: 64 % (ref 32–75)
PLATELET: 209 10*3/uL (ref 150–400)
RBC: 2.93 M/uL — ABNORMAL LOW (ref 3.80–5.20)
RDW: 19.3 % — ABNORMAL HIGH (ref 11.5–14.5)
WBC: 11.4 10*3/uL — ABNORMAL HIGH (ref 3.6–11.0)

## 2008-01-29 LAB — AMMONIA: Ammonia, plasma: 38 umol/L — ABNORMAL HIGH (ref 11–32)

## 2008-01-29 LAB — BILIRUBIN, FRACTIONATED
Bilirubin, direct: 2.9 MG/DL — ABNORMAL HIGH (ref 0.0–0.3)
Bilirubin, indirect: 1.1 MG/DL (ref 0–1.1)
Bilirubin, total: 4 MG/DL — ABNORMAL HIGH (ref <1.0)

## 2008-01-29 LAB — BILIRUBIN, CONFIRM: Bilirubin UA, confirm: POSITIVE — AB

## 2008-01-29 LAB — LIPASE: Lipase: 553 U/L — ABNORMAL HIGH (ref 114–286)

## 2008-01-31 LAB — CULTURE, URINE
Colonies Counted: <10000
Colony Count: <10000
Culture result:: NO GROWTH
Culture: NO GROWTH

## 2008-03-25 LAB — METABOLIC PANEL, COMPREHENSIVE
A-G Ratio: 0.6 — ABNORMAL LOW (ref 1.1–2.2)
ALT (SGPT): 29 U/L — ABNORMAL LOW (ref 30–65)
AST (SGOT): 42 U/L — ABNORMAL HIGH (ref 15–37)
Albumin: 2.9 g/dL — ABNORMAL LOW (ref 3.5–5.0)
Alk. phosphatase: 236 U/L — ABNORMAL HIGH (ref 50–136)
Anion gap: 9 mmol/L (ref 5–15)
BUN/Creatinine ratio: 15 (ref 12–20)
BUN: 12 MG/DL (ref 6–20)
Bilirubin, total: 3.1 MG/DL — ABNORMAL HIGH (ref <1.0)
CO2: 26 MMOL/L (ref 21–32)
Calcium: 8.8 MG/DL (ref 8.5–10.1)
Chloride: 101 MMOL/L (ref 97–108)
Creatinine: 0.8 MG/DL (ref 0.6–1.3)
GFR est AA: >60 mL/min/{1.73_m2} (ref >60)
GFR est non-AA: >60 mL/min/{1.73_m2} (ref >60)
Globulin: 4.8 g/dL — ABNORMAL HIGH (ref 2.0–4.0)
Glucose: 109 MG/DL — ABNORMAL HIGH (ref 50–100)
Potassium: 3.4 MMOL/L — ABNORMAL LOW (ref 3.5–5.1)
Protein, total: 7.7 g/dL (ref 6.4–8.2)
Sodium: 136 MMOL/L (ref 136–145)

## 2008-03-25 LAB — CBC WITH AUTOMATED DIFF
ABS. BASOPHILS: 0.1 10*3/uL (ref 0.0–0.1)
ABS. EOSINOPHILS: 0.3 10*3/uL (ref 0.0–0.4)
ABS. LYMPHOCYTES: 2.3 10*3/uL (ref 0.8–3.5)
ABS. MONOCYTES: 0.8 10*3/uL (ref 0–1.0)
ABS. NEUTROPHILS: 4.9 10*3/uL (ref 1.8–8.0)
BASOPHILS: 1 % (ref 0–1)
EOSINOPHILS: 3 % (ref 0–7)
HCT: 24.9 % — ABNORMAL LOW (ref 35.0–47.0)
HGB: 8.1 g/dL — ABNORMAL LOW (ref 11.5–16.0)
LYMPHOCYTES: 28 % (ref 12–49)
MCH: 28.6 PG (ref 26.0–34.0)
MCHC: 32.5 g/dL (ref 30.0–35.0)
MCV: 88 FL (ref 80.0–99.0)
MONOCYTES: 9 % (ref 5–13)
NEUTROPHILS: 59 % (ref 32–75)
PLATELET: 165 10*3/uL (ref 150–400)
RBC: 2.83 M/uL — ABNORMAL LOW (ref 3.80–5.20)
RDW: 15.6 % — ABNORMAL HIGH (ref 11.5–14.5)
WBC: 8.3 10*3/uL (ref 3.6–11.0)

## 2008-03-25 LAB — URINALYSIS W/ REFLEX CULTURE
Glucose: NEGATIVE MG/DL
Ketone: NEGATIVE MG/DL
Nitrites: NEGATIVE
Protein: 30 MG/DL — AB
RBC: >100 /HPF — ABNORMAL HIGH (ref 0–5)
Specific gravity: 1.024 (ref 1.003–1.030)
Urobilinogen: 1 EU/DL (ref 0.2–1.0)
pH (UA): 6 (ref 5.0–8.0)

## 2008-03-25 LAB — PTT, ACTIVATED: aPTT: 28.5 s (ref 24.0–33.0)

## 2008-03-25 LAB — AMMONIA: Ammonia, plasma: 49 umol/L — ABNORMAL HIGH (ref 11–32)

## 2008-03-25 LAB — NT-PRO BNP: NT pro-BNP: 410 PG/ML — ABNORMAL HIGH (ref 0–125)

## 2008-03-25 LAB — PROTHROMBIN TIME + INR
INR: 1.5 — ABNORMAL HIGH (ref 0.9–1.1)
Prothrombin time: 14 SECS — ABNORMAL HIGH (ref 9.0–11.0)

## 2008-03-25 LAB — CK W/ REFLX CKMB: CK: 56 U/L (ref 21–215)

## 2008-03-25 LAB — BILIRUBIN, CONFIRM: Bilirubin UA, confirm: POSITIVE — AB

## 2008-03-25 LAB — ETHYL ALCOHOL: ALCOHOL(ETHYL),SERUM: 2 MG/DL — AB

## 2008-03-25 LAB — LACTIC ACID: Lactic acid: 0.8 MMOL/L (ref 0.4–2.0)

## 2008-03-26 LAB — CULTURE, URINE
Colonies Counted: <1000
Colony Count: <1000
Culture result:: NO GROWTH
Culture: NO GROWTH

## 2008-04-04 LAB — URINALYSIS W/ REFLEX CULTURE
Glucose: NEGATIVE MG/DL
Ketone: NEGATIVE MG/DL
Nitrites: NEGATIVE
RBC: >100 /HPF — ABNORMAL HIGH (ref 0–5)
Specific gravity: 1.017 (ref 1.003–1.030)
Urobilinogen: 1 EU/DL (ref 0.2–1.0)
pH (UA): 6 (ref 5.0–8.0)

## 2008-04-04 LAB — PROTHROMBIN TIME + INR
INR: 1.6 — ABNORMAL HIGH (ref 0.9–1.1)
Prothrombin time: 15.2 SECS — ABNORMAL HIGH (ref 9.0–11.0)

## 2008-04-04 LAB — CBC WITH AUTOMATED DIFF
ABS. BASOPHILS: 0 10*3/uL (ref 0.0–0.1)
ABS. EOSINOPHILS: 0.3 10*3/uL (ref 0.0–0.4)
ABS. LYMPHOCYTES: 1.9 10*3/uL (ref 0.8–3.5)
ABS. MONOCYTES: 0.7 10*3/uL (ref 0–1.0)
ABS. NEUTROPHILS: 4.9 10*3/uL (ref 1.8–8.0)
BASOPHILS: 0 % (ref 0–1)
EOSINOPHILS: 4 % (ref 0–7)
HCT: 24 % — ABNORMAL LOW (ref 35.0–47.0)
HGB: 8 g/dL — ABNORMAL LOW (ref 11.5–16.0)
LYMPHOCYTES: 24 % (ref 12–49)
MCH: 28.8 PG (ref 26.0–34.0)
MCHC: 33.3 g/dL (ref 30.0–35.0)
MCV: 86.3 FL (ref 80.0–99.0)
MONOCYTES: 10 % (ref 5–13)
NEUTROPHILS: 62 % (ref 32–75)
PLATELET: 134 10*3/uL — ABNORMAL LOW (ref 150–400)
RBC: 2.78 M/uL — ABNORMAL LOW (ref 3.80–5.20)
RDW: 16.4 % — ABNORMAL HIGH (ref 11.5–14.5)
WBC: 7.8 10*3/uL (ref 3.6–11.0)

## 2008-04-04 LAB — AMYLASE: Amylase: 40 U/L (ref 25–115)

## 2008-04-04 LAB — METABOLIC PANEL, COMPREHENSIVE
A-G Ratio: 0.6 — ABNORMAL LOW (ref 1.1–2.2)
ALT (SGPT): 25 U/L — ABNORMAL LOW (ref 30–65)
AST (SGOT): 37 U/L (ref 15–37)
Albumin: 2.6 g/dL — ABNORMAL LOW (ref 3.5–5.0)
Alk. phosphatase: 217 U/L — ABNORMAL HIGH (ref 50–136)
Anion gap: 8 mmol/L (ref 5–15)
BUN/Creatinine ratio: 11 — ABNORMAL LOW (ref 12–20)
BUN: 11 MG/DL (ref 6–20)
Bilirubin, total: 3.5 MG/DL — ABNORMAL HIGH (ref <1.0)
CO2: 27 MMOL/L (ref 21–32)
Calcium: 8.1 MG/DL — ABNORMAL LOW (ref 8.5–10.1)
Chloride: 103 MMOL/L (ref 97–108)
Creatinine: 1 MG/DL (ref 0.6–1.3)
GFR est AA: >60 mL/min/{1.73_m2} (ref >60)
GFR est non-AA: >60 mL/min/{1.73_m2} (ref >60)
Globulin: 4.5 g/dL — ABNORMAL HIGH (ref 2.0–4.0)
Glucose: 109 MG/DL — ABNORMAL HIGH (ref 50–100)
Potassium: 2.8 MMOL/L — ABNORMAL LOW (ref 3.5–5.1)
Protein, total: 7.1 g/dL (ref 6.4–8.2)
Sodium: 138 MMOL/L (ref 136–145)

## 2008-04-04 LAB — PTT, ACTIVATED: aPTT: 30.3 s (ref 24.0–33.0)

## 2008-04-04 LAB — BILIRUBIN, CONFIRM: Bilirubin UA, confirm: POSITIVE — AB

## 2008-04-04 LAB — LIPASE: Lipase: 284 U/L (ref 114–286)

## 2008-04-06 LAB — CULTURE, URINE
Colonies Counted: <1000
Colony Count: <1000
Culture result:: NO GROWTH
Culture: NO GROWTH

## 2008-04-14 LAB — CBC WITH AUTOMATED DIFF
ABS. BASOPHILS: 0.1 10*3/uL (ref 0.0–0.1)
ABS. EOSINOPHILS: 0.5 10*3/uL — ABNORMAL HIGH (ref 0.0–0.4)
ABS. LYMPHOCYTES: 1.7 10*3/uL (ref 0.8–3.5)
ABS. MONOCYTES: 0.7 10*3/uL (ref 0–1.0)
ABS. NEUTROPHILS: 5.4 10*3/uL (ref 1.8–8.0)
BASOPHILS: 1 % (ref 0–1)
EOSINOPHILS: 5 % (ref 0–7)
HCT: 23.6 % — ABNORMAL LOW (ref 35.0–47.0)
HGB: 7.7 g/dL — ABNORMAL LOW (ref 11.5–16.0)
LYMPHOCYTES: 20 % (ref 12–49)
MCH: 28.1 PG (ref 26.0–34.0)
MCHC: 32.6 g/dL (ref 30.0–35.0)
MCV: 86.1 FL (ref 80.0–99.0)
MONOCYTES: 9 % (ref 5–13)
NEUTROPHILS: 65 % (ref 32–75)
PLATELET: 139 10*3/uL — ABNORMAL LOW (ref 150–400)
RBC: 2.74 M/uL — ABNORMAL LOW (ref 3.80–5.20)
RDW: 17.5 % — ABNORMAL HIGH (ref 11.5–14.5)
WBC: 8.3 10*3/uL (ref 3.6–11.0)

## 2008-04-14 LAB — URINALYSIS W/ REFLEX CULTURE
Glucose: NEGATIVE MG/DL
Ketone: NEGATIVE MG/DL
Leukocyte Esterase: NEGATIVE
Nitrites: NEGATIVE
Specific gravity: 1.015 (ref 1.003–1.030)
Urobilinogen: 1 EU/DL (ref 0.2–1.0)
pH (UA): 6 (ref 5.0–8.0)

## 2008-04-14 LAB — METABOLIC PANEL, COMPREHENSIVE
A-G Ratio: 0.6 — ABNORMAL LOW (ref 1.1–2.2)
ALT (SGPT): 24 U/L — ABNORMAL LOW (ref 30–65)
AST (SGOT): 36 U/L (ref 15–37)
Albumin: 2.6 g/dL — ABNORMAL LOW (ref 3.5–5.0)
Alk. phosphatase: 192 U/L — ABNORMAL HIGH (ref 50–136)
Anion gap: 10 mmol/L (ref 5–15)
BUN/Creatinine ratio: 10 — ABNORMAL LOW (ref 12–20)
BUN: 9 MG/DL (ref 6–20)
Bilirubin, total: 5.3 MG/DL — ABNORMAL HIGH (ref <1.0)
CO2: 27 MMOL/L (ref 21–32)
Calcium: 8.1 MG/DL — ABNORMAL LOW (ref 8.5–10.1)
Chloride: 100 MMOL/L (ref 97–108)
Creatinine: 0.9 MG/DL (ref 0.6–1.3)
GFR est AA: >60 mL/min/{1.73_m2} (ref >60)
GFR est non-AA: >60 mL/min/{1.73_m2} (ref >60)
Globulin: 4.4 g/dL — ABNORMAL HIGH (ref 2.0–4.0)
Glucose: 102 MG/DL — ABNORMAL HIGH (ref 50–100)
Potassium: 2.4 MMOL/L — CL (ref 3.5–5.1)
Protein, total: 7 g/dL (ref 6.4–8.2)
Sodium: 137 MMOL/L (ref 136–145)

## 2008-04-14 LAB — PROTHROMBIN TIME + INR
INR: 1.7 — ABNORMAL HIGH (ref 0.9–1.1)
Prothrombin time: 15.9 SECS — ABNORMAL HIGH (ref 9.0–11.0)

## 2008-04-14 LAB — AMMONIA: Ammonia, plasma: 50 umol/L — ABNORMAL HIGH (ref 11–32)

## 2008-04-14 LAB — LIPASE: Lipase: 235 U/L (ref 114–286)

## 2008-04-14 LAB — BILIRUBIN, CONFIRM: Bilirubin UA, confirm: POSITIVE — AB

## 2008-04-14 LAB — AMYLASE: Amylase: 22 U/L — ABNORMAL LOW (ref 25–115)

## 2008-04-14 LAB — MAGNESIUM: Magnesium: 1.6 MG/DL (ref 1.6–2.4)

## 2008-04-14 NOTE — H&P (Signed)
Name: Breanna Lester, Breanna Lester Admitted: 04/14/2008  MR #: [id number] DOB: November 14, 1960  Account #: [id number] Age: 47  Physician: Ned Grace, MD Location: [phone]     HISTORY PHYSICAL      PRIMARY CARE PROVIDER: Doree Fudge, MD.    GI DOCTOR: Jonny Ruiz, MD.    PULMONOLOGIST: Charlynn Court, MD.    NEPHROLOGIST: Marina Gravel, MD.    CHIEF COMPLAINT: "I just feel bad, almost like I got the flu."    HISTORY OF PRESENT ILLNESS: A 47 year old Caucasian female whom has had  recent admission within the last 3 months for prolonged hospitalization, at  that time multisystem organ failure. 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 The patient was discharged to home on a stable subset  medications and has been rather compliant with them with the exception of  her potassium, which she has not been taking. The patient notes that she  was in the Emergency Room 2 weeks ago with the same symptoms of muscle  aches, swelling in her legs, swelling in her belly, and at that time was  worked up in the Emergency Room and sent home. She did get antibiotics at  that time. [name] was aware of that admission. This time, the patient  presented back to the Emergency Room with complaints of worsening edema on  both lower legs and abdomen as well. Mild abdominal pain. No nausea,  vomiting or diarrhea. No fevers or chills. No recent change in the  medications. She notes she has been compliant with her lactulose and is  moving her bowels appropriately. Her husband is also presented in the room  and helps to contribute to the history of present illness by saying that  she is more weak than usual. He notes that she has had one day where she  doubled up on her Bumex and had better diuresis, then went on 1 mg of Bumex  only per day. Again, she is basically with held her potassium tablets  because his heart to swallow. In the Emergency Room, the patient was worked   up and evaluated, found to have a hemoglobin of 7.7, which is below her  baseline of 8.7. Potassium was 2.4. BUN and creatinine of 9 and 0.9, total  bilirubin was up to 5.3 with an alkaline phosphatase of 192, and albumin  2.6. Urinalysis showed possible early urinary tract infection. Ammonia was  up to 50. Chest x-ray was basically no change since showing only chronic  bilateral pleural effusions and cardiomegaly with vascular congestion  noted. Given the above findings and the fact, the patient's blood pressures  were in the 80s. The patient will be admitted to the hospital for further  workup evaluation.    Past medical history and past surgical history: Reviewed old records and  discussed with the patient.  1. Hysterectomy, C-section, tonsillectomy, cirrhosis, thought to be  cryptogenic, and gastroesophageal reflux disease.  2. Arthritis.  3. Possible inflammatory bowel syndrome.  4. MRSA pneumonia, status post treatment.    FAMILY HISTORY: Coronary disease, breast cancer, and hypertension.    SOCIAL HISTORY: Continues to smoke tobacco products approximately quarter  to half pack a day. No alcohol and is married living with her husband.    ALLERGIES: LATEX.    MEDICATIONS: The patient is on;  1. Lactulose 30 mL by mouth every day.  2. Spironolactone 50 mg by mouth every day.  3. Potassium every day.  4. She is on as needed Dilaudid for  abdominal pain and Bumex 1 mg by mouth  every day.    REVIEW OF SYSTEMS: No headache. No change in vision. No odynophagia,  dysphagia, hemoptysis, hematemesis, hematochezia, decreased oral intake as  described in history of present illness. No chest pressure or chest pain.  No shortness of breath. No abdominal complaints other than those mentioned  in history of present illness, which include slight abdominal pain  diffusely throughout and increased abdominal distention. Positive bowel  movements. No bright red blood per rectum. No dark tarry stools. Urine   output has been moderate at best. According to the patient, she has  somewhat dark urine. Lower extremity shows no new rashes or lesions but  increased fluid. She notes that overall her body feels achy and more  sluggish than usual and more weak. No sick contacts. No travel history. All  other review of systems negative except for that mentioned in history of  present illness.    PHYSICAL EXAMINATION:  VITAL SIGNS: In the Emergency Room, the patient's temperature 97.6, pulse  88 to 94, respirations 20 to 25, systolic pressures is as low as 89/74,  high is 133/65, and saturating 91 to 97% on room air.  GENERAL: This is a pleasant older than her stated age chronically  ill-appearing, Caucasian female, yellowed in color, lying in bed in no  acute distress.  HEENT: Normocephalic, atraumatic. Her pupils equally round and reactive.  Extraocular movements are intact. Icteric sclerae. Conjunctiva are pale. No  sinus tenderness. No JVP. Positive hepatojugular reflux. No  lymphadenopathy. No thyromegaly. No carotid bruits. No oropharyngeal  exudates or lesions. Oropharynx is dry.  CHEST: Clear to auscultation bilaterally with the exception of slight  bibasilar crackles, which I can only appreciate with every other or every  two breaths.  CARDIOVASCULAR: 2/6 systolic ejection murmur, heard best at the left  sternal border. No rubs or gallops.  ABDOMEN: Protuberant. There are striae cross in vertical plane across the  patient's abdomen. She has diffuse mild tenderness. No guarding. No  rebound. Positive ascites suspected. No overt hepatosplenomegaly could be  appreciated.  EXTREMITIES: Reveal 2+ pitting edema in bilateral lower extremity with 1+  pulse in bilateral lower extremity dorsalis pedis, 2+ pulses bilateral  upper extremity radial pulse. Full range of motion in bilateral lower  extremity and bilateral upper extremity.  NECK: Supple.  NEUROLOGIC: Cranial nerves II through XII are grossly intact. No _____   deficits. The patient was alert, and oriented x3 in no acute neurological  stress.    LABORATORY ASSESSMENT: Reveals white count 8.3, hemoglobin 7.7, platelet  count 139,000. Sodium 137, potassium 2.4, glucose 102, BUN 9, creatinine  0.9. LFTs show a total bilirubin of 5.3, alkaline phosphatase 192, AST 36,  ALT 24, and albumin at 2.6, given the globulin gap of 4.4, amylase 22, and  lipase 235. Urinalysis shows positive white cells, positive bacteria.    INR 1.7 with a PT of 16, ammonia is 50. Chest x-ray shows cardiomegaly with  vascular congestion. Guaiac examination negative.    ASSESSMENT AND PLAN: The patient is acutely ill with high risk for  deterioration due to the following.  1. Anemia: Again whether this is consumptive destruction or creation  etiologies challenging. I suspect that we need to follow the patient  carefully but transfusion of 2 units packed red blood cells at least is  appropriate at this point in time. We will follow the patient carefully for  now.  2. Hypotension, likely related to the  patient's end-stage liver disease.  She tends to run a low blood pressure. We will give the patient appropriate  fluids. I suspect she is prerenal given that she is taking her Bumex but is  not replacing it with fluid appropriately, and I suspect much of the fluid  as interbody of course, it is extravascular so therefore she is likely  intravascularly dry hence the hypotension. Plan will be intravenous fluids  for now and follow. She may require albumin therapy. Of course, the blood  will also help to pull volume into the vascular space.  3. Hypokalemia, replace by mouth. We will and check magnesium as well and  replace empirically.  4. Cirrhosis is cryptogenic in etiology according to Dr. Lennie Hummer notes. He  will see the patient and evaluate. She may require a large volume  paracentesis for comfort and possibly even just for quick diagnostic   paracentesis to ensure there is no spontaneous bacterial peritonitis,  although the patient without a white blood count nor fever at my suspicion  is low for SBP.  5. Protein calorie malnutrition, rather severe. Replace by mouth as able.    6. Encephalopathy. The patient with ammonia, which is climbing to 50. We  will increase the lactulose dosing and follow.  7. Possible early urinary tract infection. This could be the trigger that  has placed her into a situation of worsening end-stage liver disease.  Rocephin 1 gram intravenous every morning. Check urine cultures and adjust  as able.  8. Elevated liver function test with jaundice per [name]. This likely  goes along with cryptogenic cirrhosis, which is continuing and worsening. I  suspect the patient will benefit from [facility] transplant evaluation.  9. The patient is a full code.  10. Lovenox for deep venous thrombosis prophylaxis.          E-Signed By  Ned Grace, MD 06/16/2008 14:30    Ned Grace, MD    cc: Doree Fudge, M.D.   Ned Grace, MD   [name], MD   Alver Fisher, III, M.D.        AKG/FI2; D: 04/14/2008 5:12 P; T: 04/14/2008 5:31 P; DOC# [id number]; Job#  [id number]

## 2008-04-14 NOTE — Consults (Signed)
Name: Breanna Lester, Breanna Lester Admitted: 04/14/2008  MR #: [id number] DOB: 02-02-1961  Account #: [id number] Age: 47  Consultant: [name], MD Location: [phone]     CONSULTATION REPORT    DATE OF CONSULTATION:  REFERRING PHYSICIAN:        REQUESTING PHYSICIAN: Virl Son, MD.    CHIEF COMPLAINT: I am seeing this patient at the request of Dr. Jamison Neighbor  because of cirrhosis, ascites, fluid retention, and a readmission for  shortness of breath and metabolic disturbances. The patient's chronic  liver disease dates to Fall and Winter of 2008 and 2009. She was admitted  in January of this year, had significant shortness of breath, and was  transferred during that admission to [facility]. Our concerns at  that time were about cirrhosis and about pulmonary AV shunts. She was seen  and evaluated by the Inpatient [facility] at [facility] and found to have  cryptogenic cirrhosis, found not to have pulmonary AV shunts and  discharged. She had readmissions in April and a significant readmission in  May of this year. In May, she presented with fluid retention, shortness of  breath, and ascites as well as anemia. Posttransfusion, she developed  respiratory failure, was transferred to the ICU under the care of Dr.  Rivka Spring, treated aggressively by [name] and recovered without  tracheostomy after having been intubated for approximately a week. The  reason for the respiratory failure was felt to possibly due to trali  although other causes of adult respiratory distress type syndrome were  certainly possible.    She is readmitted today with progressive weakness, fatigue, and shortness  of breath. She has not had any black or bloody stools. She has not had  any vomiting.    PAST MEDICAL HISTORY: No history of intravenous drug use. No history of  hepatitis prior to this. History of anemia, history of leukocytosis,  irritable bowel syndrome, gastroesophageal reflux disease, pneumonia.     FAMILY HISTORY: Positive for Crohn disease in her sister, breast and  uterine cancer in her mother.    SOCIAL HISTORY: She is seen with her husband. She is married. She smokes  but not very much and does not use alcohol.    ALLERGIES: None known.    HOME MEDICATIONS: Please see admission sheet.    REVIEW OF SYSTEMS: Obtained in detail with the nurse present. It is  positive for refractive air, shortness of breath, swelling of the abdomen  and of the legs.    PHYSICAL EXAMINATION:  VITAL SIGNS: Please see nursing sheet.  GENERAL: Appears older than stated age, tachypneic and chronically ill.  EYES: Slight scleral icterus. Extraocular motions intact.  ENMT: Lips, teeth, gums, and oropharynx unremarkable.  CHEST: She is obviously short of breath at rest. They are audible  wheezes. No use of accessory muscles.  HEART: Regular rate and rhythm. No abnormal sounds.  ABDOMEN: There is a doughy sensation and thick edema of the skin of the  abdominal wall. I cannot be certain about hepatosplenomegaly due to this.  There is umbilical ptosis and bulging flanks suggestive of ascites.  EXTREMITIES: 2+ pretibial edema bilaterally.  SKIN: See above.    LABORATORY DATA: Hemoglobin 7.7, white blood cell count normal. INR 1.7,  potassium 2.4, glucose 102, albumin 2.6, alkaline phosphatase 192, total  bilirubin 5.3, SGPT 24.    IMPRESSION REPORT AND PLAN:  1. Chronic anemia.  2. Cirrhosis, cryptogenic.  3. Probable ascites.  4. Worsening shortness of breath, probably secondary  to  hypoalbuminemia, anasarca, ascites, anemia, and chronic disease.  5. Coagulopathy due to liver disease.  6. Thrombocytopenia (platelets 139,000).  7. No known true drug sensitivity.    RECOMMENDATIONS:  1. I agree with hospitalization, replacement of potassium, and  assessment of fluid electrolyte status.  2. I think transfusion is reasonable. It will certainly help her  oxygen carrying capacity and improve her respiratory function. I share Dr.   Crista Curb concern about possible trali and the final decision up to him. I  have alerted the nursing staff regarding her previous possible trali.  3. Paracentesis. I sent for culture and cell count in the morning.  4. I will follow with you.  5. She needs a followup consult at [facility]. She meets  criteria for transplant from a cursory standpoint (chronic liver disease  and impaired quality of life). Whether she is a candidate for transplant  and aware she should go on a list of decision for them to finalize with  her.  6. I discussed with the patient and her husband that ultimately she  will need a liver transplant and her close followup at the [facility] at  [facility] is important to her survival.    I thank Dr. Jamison Neighbor for this interesting consultation.          E-Signed By  [name], MD 05/07/2008 15:12    [name], MD    cc: Doree Fudge, M.D.   Ned Grace, MD   [name], MD   Alver Fisher, III, M.D.   Liam Rogers, MD        RFK/FI2; D: 04/14/2008 7:07 P; T: 04/14/2008 7:44 P; DOC# [id number]; Job#  [id number]

## 2008-04-15 LAB — MAGNESIUM: Magnesium: 2.1 MG/DL (ref 1.6–2.4)

## 2008-04-15 LAB — CULTURE, URINE
Colonies Counted: <10000
Colony Count: <10000
Culture result:: NO GROWTH
Culture: NO GROWTH

## 2008-04-15 LAB — HEPATIC FUNCTION PANEL
A-G Ratio: 0.6
ALT (SGPT): 20 U/L — ABNORMAL LOW (ref 30–65)
AST (SGOT): 40 U/L — ABNORMAL HIGH (ref 15–37)
Albumin: 2.6 g/dL — ABNORMAL LOW (ref 3.5–5.0)
Alk. phosphatase: 196 U/L — ABNORMAL HIGH (ref 50–136)
Bilirubin, direct: 2.9 MG/DL — ABNORMAL HIGH (ref 0.0–0.3)
Bilirubin, total: 5.7 MG/DL — ABNORMAL HIGH (ref <1.0)
Globulin: 4.6 g/dL — ABNORMAL HIGH (ref 2.0–4.0)
Protein, total: 7.2 g/dL (ref 6.4–8.2)

## 2008-04-15 LAB — TYPE + CROSSMATCH
ABO/Rh(D): A POS
Antibody screen: NEGATIVE
Status of unit: TRANSFUSED

## 2008-04-15 LAB — CBC W/O DIFF
HCT: 26.1 % — ABNORMAL LOW (ref 35.0–47.0)
HGB: 8.4 g/dL — ABNORMAL LOW (ref 11.5–16.0)
MCH: 27.9 PG (ref 26.0–34.0)
MCHC: 32.2 g/dL (ref 30.0–35.0)
MCV: 86.7 FL (ref 80.0–99.0)
PLATELET: 138 10*3/uL — ABNORMAL LOW (ref 150–400)
RBC: 3.01 M/uL — ABNORMAL LOW (ref 3.80–5.20)
RDW: 17.5 % — ABNORMAL HIGH (ref 11.5–14.5)
WBC: 10.3 10*3/uL (ref 3.6–11.0)

## 2008-04-15 LAB — METABOLIC PANEL, BASIC
Anion gap: 10 mmol/L (ref 5–15)
BUN/Creatinine ratio: 9 — ABNORMAL LOW (ref 12–20)
BUN: 8 MG/DL (ref 6–20)
CO2: 25 MMOL/L (ref 21–32)
Calcium: 7.9 MG/DL — ABNORMAL LOW (ref 8.5–10.1)
Chloride: 102 MMOL/L (ref 97–108)
Creatinine: 0.9 MG/DL (ref 0.6–1.3)
GFR est AA: >60 mL/min/{1.73_m2} (ref >60)
GFR est non-AA: >60 mL/min/{1.73_m2} (ref >60)
Glucose: 121 MG/DL — ABNORMAL HIGH (ref 50–100)
Potassium: 3.4 MMOL/L — ABNORMAL LOW (ref 3.5–5.1)
Sodium: 137 MMOL/L (ref 136–145)

## 2008-04-15 LAB — FERRITIN: Ferritin: 68 NG/ML (ref 10–291)

## 2008-04-15 LAB — IRON PROFILE
Iron % saturation: 46 % (ref 20–50)
Iron: 124 ug/dL (ref 35–150)
TIBC: 272 ug/dL (ref 250–450)

## 2008-04-16 LAB — METABOLIC PANEL, COMPREHENSIVE
A-G Ratio: 0.6 — ABNORMAL LOW (ref 1.1–2.2)
ALT (SGPT): 26 U/L — ABNORMAL LOW (ref 30–65)
AST (SGOT): 39 U/L — ABNORMAL HIGH (ref 15–37)
Albumin: 2.6 g/dL — ABNORMAL LOW (ref 3.5–5.0)
Alk. phosphatase: 202 U/L — ABNORMAL HIGH (ref 50–136)
Anion gap: 8 mmol/L (ref 5–15)
BUN/Creatinine ratio: 8 — ABNORMAL LOW (ref 12–20)
BUN: 6 MG/DL (ref 6–20)
Bilirubin, total: 4.9 MG/DL — ABNORMAL HIGH (ref <1.0)
CO2: 26 MMOL/L (ref 21–32)
Calcium: 8 MG/DL — ABNORMAL LOW (ref 8.5–10.1)
Chloride: 101 MMOL/L (ref 97–108)
Creatinine: 0.8 MG/DL (ref 0.6–1.3)
GFR est AA: >60 mL/min/{1.73_m2} (ref >60)
GFR est non-AA: >60 mL/min/{1.73_m2} (ref >60)
Globulin: 4.6 g/dL — ABNORMAL HIGH (ref 2.0–4.0)
Glucose: 90 MG/DL (ref 50–100)
Potassium: 3.3 MMOL/L — ABNORMAL LOW (ref 3.5–5.1)
Protein, total: 7.2 g/dL (ref 6.4–8.2)
Sodium: 135 MMOL/L — ABNORMAL LOW (ref 136–145)

## 2008-04-17 LAB — METABOLIC PANEL, BASIC
Anion gap: 10 mmol/L (ref 5–15)
BUN/Creatinine ratio: 7 — ABNORMAL LOW (ref 12–20)
BUN: 6 MG/DL (ref 6–20)
CO2: 27 MMOL/L (ref 21–32)
Calcium: 7.9 MG/DL — ABNORMAL LOW (ref 8.5–10.1)
Chloride: 99 MMOL/L (ref 97–108)
Creatinine: 0.9 MG/DL (ref 0.6–1.3)
GFR est AA: >60 mL/min/{1.73_m2} (ref >60)
GFR est non-AA: >60 mL/min/{1.73_m2} (ref >60)
Glucose: 94 MG/DL (ref 50–100)
Potassium: 3.2 MMOL/L — ABNORMAL LOW (ref 3.5–5.1)
Sodium: 136 MMOL/L (ref 136–145)

## 2008-04-18 NOTE — Discharge Summary (Signed)
Name: Breanna Lester, Breanna Lester Admitted: 04/14/2008  MR #: [id number] Discharged: 04/17/2008  Account #: [id number] DOB: 02/05/1961  Physician: Pamalee Leyden, MD Age 47     DISCHARGE SUMMARY        FINAL DIAGNOSES:  1. Anasarca.  2. Anemia.  3. Liver cirrhosis (cryptogenic).  4. Hypotension, resolved.  5. Hypokalemia.  6. Weakness.    CONSULTATION: [name], MD from Gastroenterology.    PROCEDURES/TESTS: Abdominal ultrasound demonstrates a very small amount of  ascites.    REASON FOR ADMISSION: For details, please refer to [name] admission  note. In brief, this is a 47 year old female diagnosed with cryptogenic  cirrhosis, who presents with weakness and flu-like symptoms and was found  to be volume overloaded and anemic. The patient was admitted for further  evaluation and treatment.    HOSPITAL COURSE: The patient received 2 units of packed cells. She had no  clinical bleeding. She has chronically been anemic with a normocytic  pattern, suggesting underlying anemia of chronic disease. After  transfusion, her hemoglobin came back up to 8.4, which is close to her  previous known baseline. She was clearly anasarcic or volume overloaded  and did well with diuresis, using intravenous Bumex with Aldactone. She  came in with a weight of 180 and was discharged with a weight of 173,  loosing close to 7 pounds. She still has a fair amount of lower abdominal  wall edema, as well as pitting edema over the dependent portions of her  lower extremities. I estimate there is still another 10 pounds of fluid in  her system. The patient will continue on a higher dose of diuretics and  she will follow up with her gastroenterologist, [name] in 1-2 weeks.    DISCHARGE INSTRUCTIONS:    DIET: Low salt.    DISCHARGE ACTIVITY: As tolerated.    DISCHARGE MEDICATIONS:    NEW PRESCRIPTIONS:  1. Bumex 1 mg 2 times a day.  2. Aldactone 50 mg 2 times a day.    CONTINUE WITH PRIOR MEDICATIONS WITHOUT CHANGE:  1. KCl 20 mEq daily.   2. Zoloft 50 mg daily.  3. Lactulose 1 tablespoon or 15 mg 2 times a day.    DISCHARGE CONDITION: Improved and stable.    FOLLOW UP:  a. [name] in 1-2 weeks.  b. [name] and [name]. She will call for her own  appointments as needed or keep her next one that is already scheduled.    Greater than 30 minutes was spent coordinating today's discharge.            E-Signed By  Pamalee Leyden, MD 04/25/2008 08:34    Pamalee Leyden, MD    cc: Doree Fudge, M.D.   [name], MD   Alver Fisher, III, M.D.   Pamalee Leyden, MD        JLT/FI2; D: 04/17/2008 9:45 A; T: 04/18/2008 9:33 A; DOC# [id number]; Job#  [id number]

## 2008-04-19 LAB — CULTURE, BLOOD, PAIRED
Culture result:: NO GROWTH
Report Status: 8302009

## 2008-06-15 LAB — CBC WITH AUTOMATED DIFF
ABS. BASOPHILS: 0 10*3/uL (ref 0.0–0.1)
ABS. EOSINOPHILS: 0.3 10*3/uL (ref 0.0–0.4)
ABS. LYMPHOCYTES: 1.8 10*3/uL (ref 0.8–3.5)
ABS. MONOCYTES: 0.5 10*3/uL (ref 0–1.0)
ABS. NEUTROPHILS: 3.9 10*3/uL (ref 1.8–8.0)
BASOPHILS: 1 % (ref 0–1)
EOSINOPHILS: 5 % (ref 0–7)
HCT: 29.1 % — ABNORMAL LOW (ref 35.0–47.0)
HGB: 9.8 g/dL — ABNORMAL LOW (ref 11.5–16.0)
LYMPHOCYTES: 28 % (ref 12–49)
MCH: 29.1 PG (ref 26.0–34.0)
MCHC: 33.7 g/dL (ref 30.0–35.0)
MCV: 86.4 FL (ref 80.0–99.0)
MONOCYTES: 7 % (ref 5–13)
NEUTROPHILS: 59 % (ref 32–75)
PLATELET: 117 10*3/uL — ABNORMAL LOW (ref 150–400)
RBC: 3.37 M/uL — ABNORMAL LOW (ref 3.80–5.20)
RDW: 15.9 % — ABNORMAL HIGH (ref 11.5–14.5)
WBC: 6.5 10*3/uL (ref 3.6–11.0)

## 2008-06-15 LAB — URINALYSIS W/ REFLEX CULTURE
Bacteria: NEGATIVE /HPF
Blood: NEGATIVE
Glucose: NEGATIVE MG/DL
Ketone: NEGATIVE MG/DL
Leukocyte Esterase: NEGATIVE
Nitrites: NEGATIVE
Protein: NEGATIVE MG/DL
Specific gravity: 1.017 (ref 1.003–1.030)
Urobilinogen: 1 EU/DL (ref 0.2–1.0)
pH (UA): 6 (ref 5.0–8.0)

## 2008-06-15 LAB — AMYLASE: Amylase: 56 U/L (ref 25–115)

## 2008-06-15 LAB — METABOLIC PANEL, COMPREHENSIVE
A-G Ratio: 0.7 — ABNORMAL LOW (ref 1.1–2.2)
ALT (SGPT): 31 U/L (ref 30–65)
AST (SGOT): 43 U/L — ABNORMAL HIGH (ref 15–37)
Albumin: 3.3 g/dL — ABNORMAL LOW (ref 3.5–5.0)
Alk. phosphatase: 195 U/L — ABNORMAL HIGH (ref 50–136)
Anion gap: 9 mmol/L (ref 5–15)
BUN/Creatinine ratio: 9 — ABNORMAL LOW (ref 12–20)
BUN: 8 MG/DL (ref 6–20)
Bilirubin, total: 2.5 MG/DL — ABNORMAL HIGH (ref <1.0)
CO2: 28 MMOL/L (ref 21–32)
Calcium: 9.5 MG/DL (ref 8.5–10.1)
Chloride: 100 MMOL/L (ref 97–108)
Creatinine: 0.9 MG/DL (ref 0.6–1.3)
GFR est AA: >60 mL/min/{1.73_m2} (ref >60)
GFR est non-AA: >60 mL/min/{1.73_m2} (ref >60)
Globulin: 4.7 g/dL — ABNORMAL HIGH (ref 2.0–4.0)
Glucose: 102 MG/DL — ABNORMAL HIGH (ref 50–100)
Potassium: 3.6 MMOL/L (ref 3.5–5.1)
Protein, total: 8 g/dL (ref 6.4–8.2)
Sodium: 137 MMOL/L (ref 136–145)

## 2008-06-15 LAB — PROTHROMBIN TIME + INR
INR: 1.4 — ABNORMAL HIGH (ref 0.9–1.1)
Prothrombin time: 13.4 SECS — ABNORMAL HIGH (ref 9.0–11.0)

## 2008-06-15 LAB — PTT: aPTT: 29.2 s (ref 24.0–33.0)

## 2008-06-15 LAB — BILIRUBIN, CONFIRM: Bilirubin UA, confirm: NEGATIVE

## 2008-06-15 LAB — AMMONIA: Ammonia, plasma: 36 umol/L — ABNORMAL HIGH (ref 11–32)

## 2008-06-15 LAB — LIPASE: Lipase: 309 U/L — ABNORMAL HIGH (ref 114–286)

## 2008-06-29 LAB — CBC WITH AUTOMATED DIFF
ABS. BASOPHILS: 0.1 10*3/uL (ref 0.0–0.1)
ABS. EOSINOPHILS: 0.4 10*3/uL (ref 0.0–0.4)
ABS. LYMPHOCYTES: 2.2 10*3/uL (ref 0.8–3.5)
ABS. MONOCYTES: 0.8 10*3/uL (ref 0–1.0)
ABS. NEUTROPHILS: 3.7 10*3/uL (ref 1.8–8.0)
BASOPHILS: 1 % (ref 0–1)
EOSINOPHILS: 5 % (ref 0–7)
HCT: 30.6 % — ABNORMAL LOW (ref 35.0–47.0)
HGB: 10.2 g/dL — ABNORMAL LOW (ref 11.5–16.0)
LYMPHOCYTES: 31 % (ref 12–49)
MCH: 28.6 PG (ref 26.0–34.0)
MCHC: 33.3 g/dL (ref 30.0–35.0)
MCV: 85.7 FL (ref 80.0–99.0)
MONOCYTES: 11 % (ref 5–13)
NEUTROPHILS: 52 % (ref 32–75)
PLATELET: 132 10*3/uL — ABNORMAL LOW (ref 150–400)
RBC: 3.57 M/uL — ABNORMAL LOW (ref 3.80–5.20)
RDW: 16 % — ABNORMAL HIGH (ref 11.5–14.5)
WBC: 7.1 10*3/uL (ref 3.6–11.0)

## 2008-06-29 LAB — METABOLIC PANEL, COMPREHENSIVE
A-G Ratio: 0.8 — ABNORMAL LOW (ref 1.1–2.2)
ALT (SGPT): 30 U/L (ref 30–65)
AST (SGOT): 47 U/L — ABNORMAL HIGH (ref 15–37)
Albumin: 3.4 g/dL — ABNORMAL LOW (ref 3.5–5.0)
Alk. phosphatase: 192 U/L — ABNORMAL HIGH (ref 50–136)
Anion gap: 9 mmol/L (ref 5–15)
BUN/Creatinine ratio: 14 (ref 12–20)
BUN: 15 MG/DL (ref 6–20)
Bilirubin, total: 2.5 MG/DL — ABNORMAL HIGH (ref <1.0)
CO2: 29 MMOL/L (ref 21–32)
Calcium: 9.3 MG/DL (ref 8.5–10.1)
Chloride: 101 MMOL/L (ref 97–108)
Creatinine: 1.1 MG/DL (ref 0.6–1.3)
GFR est AA: >60 mL/min/{1.73_m2} (ref >60)
GFR est non-AA: 57 mL/min/{1.73_m2} — ABNORMAL LOW (ref >60)
Globulin: 4.4 g/dL — ABNORMAL HIGH (ref 2.0–4.0)
Glucose: 98 MG/DL (ref 50–100)
Potassium: 3.8 MMOL/L (ref 3.5–5.1)
Protein, total: 7.8 g/dL (ref 6.4–8.2)
Sodium: 139 MMOL/L (ref 136–145)

## 2008-06-29 LAB — NT-PRO BNP: NT pro-BNP: 493 PG/ML — ABNORMAL HIGH (ref 0–125)

## 2008-06-29 LAB — URINALYSIS W/MICROSCOPIC
Bacteria: NEGATIVE /HPF
Bilirubin: NEGATIVE
Blood: NEGATIVE
Glucose: NEGATIVE MG/DL
Ketone: NEGATIVE MG/DL
Leukocyte Esterase: NEGATIVE
Nitrites: NEGATIVE
Protein: NEGATIVE MG/DL
Specific gravity: 1.013 (ref 1.003–1.030)
Urobilinogen: 1 EU/DL (ref 0.2–1.0)
pH (UA): 6.5 (ref 5.0–8.0)

## 2008-06-29 LAB — AMYLASE: Amylase: 56 U/L (ref 25–115)

## 2008-06-29 LAB — LIPASE: Lipase: 352 U/L — ABNORMAL HIGH (ref 114–286)

## 2008-06-29 LAB — PROTHROMBIN TIME + INR
INR: 1.4 — ABNORMAL HIGH (ref 0.9–1.1)
Prothrombin time: 13.7 SECS — ABNORMAL HIGH (ref 9.0–11.0)

## 2008-07-06 LAB — CBC WITH AUTOMATED DIFF
ABS. BASOPHILS: 0.1 10*3/uL (ref 0.0–0.1)
ABS. EOSINOPHILS: 0.3 10*3/uL (ref 0.0–0.4)
ABS. LYMPHOCYTES: 2.3 10*3/uL (ref 0.8–3.5)
ABS. MONOCYTES: 0.9 10*3/uL (ref 0–1.0)
ABS. NEUTROPHILS: 3.8 10*3/uL (ref 1.8–8.0)
BASOPHILS: 1 % (ref 0–1)
EOSINOPHILS: 4 % (ref 0–7)
HCT: 28.1 % — ABNORMAL LOW (ref 35.0–47.0)
HGB: 9.3 g/dL — ABNORMAL LOW (ref 11.5–16.0)
LYMPHOCYTES: 31 % (ref 12–49)
MCH: 28.2 PG (ref 26.0–34.0)
MCHC: 33.1 g/dL (ref 30.0–35.0)
MCV: 85.2 FL (ref 80.0–99.0)
MONOCYTES: 13 % (ref 5–13)
NEUTROPHILS: 51 % (ref 32–75)
PLATELET: 124 10*3/uL — ABNORMAL LOW (ref 150–400)
RBC: 3.3 M/uL — ABNORMAL LOW (ref 3.80–5.20)
RDW: 16.5 % — ABNORMAL HIGH (ref 11.5–14.5)
WBC: 7.4 10*3/uL (ref 3.6–11.0)

## 2008-07-06 LAB — METABOLIC PANEL, COMPREHENSIVE
A-G Ratio: 0.8 — ABNORMAL LOW (ref 1.1–2.2)
ALT (SGPT): 31 U/L (ref 30–65)
AST (SGOT): 44 U/L — ABNORMAL HIGH (ref 15–37)
Albumin: 3.3 g/dL — ABNORMAL LOW (ref 3.5–5.0)
Alk. phosphatase: 184 U/L — ABNORMAL HIGH (ref 50–136)
Anion gap: 10 mmol/L (ref 5–15)
BUN/Creatinine ratio: 15 (ref 12–20)
BUN: 12 MG/DL (ref 6–20)
Bilirubin, total: 3.1 MG/DL — ABNORMAL HIGH (ref <1.0)
CO2: 25 MMOL/L (ref 21–32)
Calcium: 9.1 MG/DL (ref 8.5–10.1)
Chloride: 101 MMOL/L (ref 97–108)
Creatinine: 0.8 MG/DL (ref 0.6–1.3)
GFR est AA: >60 mL/min/{1.73_m2} (ref >60)
GFR est non-AA: >60 mL/min/{1.73_m2} (ref >60)
Globulin: 4 g/dL (ref 2.0–4.0)
Glucose: 88 MG/DL (ref 50–100)
Potassium: 3.3 MMOL/L — ABNORMAL LOW (ref 3.5–5.1)
Protein, total: 7.3 g/dL (ref 6.4–8.2)
Sodium: 136 MMOL/L (ref 136–145)

## 2008-07-06 LAB — URINALYSIS W/ REFLEX CULTURE
Bacteria: NEGATIVE /HPF
Blood: NEGATIVE
Glucose: NEGATIVE MG/DL
Ketone: NEGATIVE MG/DL
Nitrites: NEGATIVE
Protein: NEGATIVE MG/DL
Specific gravity: 1.02 (ref 1.003–1.030)
Urobilinogen: 1 EU/DL (ref 0.2–1.0)
pH (UA): 6 (ref 5.0–8.0)

## 2008-07-06 LAB — AMYLASE: Amylase: 29 U/L (ref 25–115)

## 2008-07-06 LAB — BILIRUBIN, CONFIRM: Bilirubin UA, confirm: NEGATIVE

## 2008-07-06 LAB — LIPASE: Lipase: 268 U/L (ref 114–286)

## 2008-10-28 LAB — CBC WITH AUTOMATED DIFF
ABS. BASOPHILS: 0 10*3/uL (ref 0.0–0.1)
ABS. EOSINOPHILS: 0.3 10*3/uL (ref 0.0–0.4)
ABS. LYMPHOCYTES: 2.1 10*3/uL (ref 0.8–3.5)
ABS. MONOCYTES: 0.7 10*3/uL (ref 0.0–1.0)
ABS. NEUTROPHILS: 4.4 10*3/uL (ref 1.8–8.0)
BASOPHILS: 0 % (ref 0–1)
EOSINOPHILS: 4 % (ref 0–7)
HCT: 29 % — ABNORMAL LOW (ref 35.0–47.0)
HGB: 9.7 g/dL — ABNORMAL LOW (ref 11.5–16.0)
LYMPHOCYTES: 28 % (ref 12–49)
MCH: 28 PG (ref 26.0–34.0)
MCHC: 33.4 g/dL (ref 30.0–36.5)
MCV: 83.6 FL (ref 80.0–99.0)
MONOCYTES: 10 % (ref 5–13)
NEUTROPHILS: 58 % (ref 32–75)
PLATELET: 97 10*3/uL — ABNORMAL LOW (ref 150–400)
RBC: 3.47 M/uL — ABNORMAL LOW (ref 3.80–5.20)
RDW: 16.2 % — ABNORMAL HIGH (ref 11.5–14.5)
WBC: 7.6 10*3/uL (ref 3.6–11.0)

## 2008-10-28 LAB — PROTHROMBIN TIME + INR
INR: 1.3 — ABNORMAL HIGH (ref 0.9–1.1)
Prothrombin time: 13.5 SECS — ABNORMAL HIGH (ref 9.0–11.0)

## 2008-10-28 MED ORDER — BUMETANIDE 1 MG TAB
1 mg | ORAL_TABLET | Freq: Every day | ORAL | Status: DC
Start: 2008-10-28 — End: 2009-01-22

## 2008-10-28 MED ORDER — LACTULOSE 10 GRAM/15 ML ORAL SOLN
10 gram/15 mL | Freq: Two times a day (BID) | ORAL | Status: DC
Start: 2008-10-28 — End: 2009-05-26

## 2008-10-28 MED ORDER — POTASSIUM CHLORIDE SR 20 MEQ TAB, PARTICLES/CRYSTALS
20 mEq | ORAL_TABLET | Freq: Two times a day (BID) | ORAL | Status: DC
Start: 2008-10-28 — End: 2009-04-29

## 2008-10-28 MED ORDER — AMOXICILLIN 250 MG CAP
250 mg | ORAL_CAPSULE | Freq: Three times a day (TID) | ORAL | Status: AC
Start: 2008-10-28 — End: 2008-11-04

## 2008-10-28 NOTE — Patient Instructions (Signed)
English   Spanish  Cirrhosis: After Your Visit  Your Care Instructions  Cirrhosis occurs when healthy liver tissue becomes scarred. This keeps the liver from working well. Cirrhosis usually happens after years of liver inflammation. It is most often caused by long-term alcohol abuse or infection with the hepatitis B or hepatitis C virus. Some medicines, too much fat in the liver, some conditions passed down in families, and other disorders also can cause cirrhosis. Sometimes no reason for the cirrhosis can be found.  Treatment cannot fix liver damage, but you may be able to slow or prevent more liver damage if you do not drink alcohol or use drugs that harm your liver.  Follow-up care is a key part of your treatment and safety. Be sure to make and go to all appointments, and call your doctor if you are having problems. It???s also a good idea to know your test results and keep a list of the medicines you take.  How can you care for yourself at home?  ?? Stop drinking all alcohol if it is the cause of your cirrhosis or adds to your problem. Tell your doctor if you need help to quit. Counseling, support groups, and sometimes medicines can help you stay sober. If your cirrhosis is from another cause, be careful about the amount of alcohol you drink because it can cause more damage to your liver.   ?? Take your medicines exactly as prescribed. Call your doctor if you think you are having a problem with your medicine.   ?? Do not take any other medicines, including over-the-counter medicines and herbal products, without talking to your doctor first.   ?? Be careful taking acetaminophen (Tylenol), ibuprofen (Advil, Motrin), or naproxen (Aleve). These can sometimes cause more liver damage. Talk with your doctor if you are not sure whether these medicines are safe for you to take.    ?? Eat a low-salt diet if your cirrhosis is causing fluid buildup in your body. Use less salt when you cook and at the table. Do not eat fast foods or snack foods with a lot of salt. Fluid buildup in your belly, legs, and chest can cause serious problems.   ?? Eat less protein, such as meat, cheese, and nuts, in your diet if your doctor has told you to.   ?? Limit your fluid intake if your doctor has told you to.   ?? If your doctor recommends it, get more exercise. Walking is a good choice. Bit by bit, increase the amount you walk every day. Try for at least 30 minutes on most days of the week. You also may want to swim, bike, or do other activities.  When should you call for help?  Call 911 anytime you think you may need emergency care. For example, call if:  ?? You vomit blood or what looks like coffee grounds.   ?? You pass maroon or very bloody stools.   ?? You pass out (lose consciousness).   ?? You feel very confused and disoriented. This is a sign of brain problems from severe liver damage.   ?? You have severe trouble breathing.  Call your doctor now or seek immediate medical care if:  ?? You have mental changes, such as sleepiness, confusion, moodiness, or a poor memory.   ?? You have new belly pain or swelling.   ?? You have many nosebleeds or are bleeding from the gums or rectum.   ?? You have a fever.   ??  Your stools are black and tarlike or have streaks of blood.  Watch closely for changes in your health, and be sure to contact your doctor if:  ?? Your skin or the whites of your eyes turn yellow, or they are more yellow than they were before.   ?? You cannot control itching.   Where can you learn more?   Go to [email or web address]  Enter M412 in the search box to learn more about "Cirrhosis: After Your Visit".    ?? 2006-2009 [employer]. Care instructions adapted under license by [employer] (which disclaims liability or warranty for this information). This care instruction is for use with your licensed healthcare professional. If you have questions about a medical condition or this instruction, always ask your healthcare professional. Healthwise disclaims any warranty or liability for your use of this information.

## 2008-10-28 NOTE — Progress Notes (Signed)
HISTORY OF PRESENT ILLNESS  Breanna Lester is a 48 y.o. female here today to establish PCP.Pt was diagnosed with cirrhosis and decompensated liver failure last year when she was admitted in hospital for double pneumonia.It is idiopathic in nature,she is seeing GI doctor Breanna Lester.In next 2 weeks she will see liver transplant specialist in [facility].She has all sign and symptoms of liver failure including flapping tremor,spider talangectesia, arhtritis.  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  HPI    Review of Systems   Constitutional: Positive for malaise/fatigue.   HENT: Positive for congestion. Negative for nosebleeds and sore throat.    Eyes: Negative.    Respiratory: Negative for cough, sputum production and shortness of breath.    Cardiovascular: Positive for leg swelling. Negative for palpitations and orthopnea.   Gastrointestinal: Positive for abdominal pain. Negative for heartburn, nausea (ascitis in abdomen) and vomiting.   Genitourinary: Negative.  Frequency: hand pain with contracture.   Musculoskeletal: Positive for joint pain.   Skin: Negative.    Neurological: Positive for tremors and headaches. Negative for sensory change, speech change and loss of consciousness.   Endo/Heme/Allergies: Negative.    Psychiatric/Behavioral: The patient is nervous/anxious.        Physical Exam   Constitutional: She is oriented. She appears well-developed and well-nourished. She appears distressed.   HENT:   Head: Normocephalic and atraumatic.   Right Ear: External ear normal.   Left Ear: External ear normal.   Mouth/Throat: Oropharynx is clear and moist. No oropharyngeal exudate.        Nasal turbinates-red inflamed,tenderness over maxillary area.    Eyes: Extraocular motions are normal. Pupils are equal, round, and reactive to light. Scleral icterus is present.   Neck: Normal range of motion. Neck supple. No tracheal deviation present. No thyromegaly present.   Cardiovascular: Normal rate, regular rhythm and intact distal pulses.  Exam reveals no gallop.    Murmur heard.        Grade 3/6 pansystolic murmur over precordium radiate to neck and Left axilla.   Pulmonary/Chest: Effort normal and breath sounds normal. No respiratory distress. She has no wheezes. She has no rales.   Abdominal: Soft. Bowel sounds are normal. She exhibits distension. No tenderness. She has no rebound and no guarding.        Hepatomegaly present 2 cmm down to rib cage.splenomegaly present.  Moderate ascitis present.  A small umbilical hernia present.   Musculoskeletal: Normal range of motion. She exhibits tenderness.        Tenderness on multiple joints.    Left hand thick fibrous band felt in middle of palm with contracture.   Lymphadenopathy:     She has no cervical adenopathy.   Neurological: She is alert and oriented. She has normal reflexes. She displays normal reflexes. No cranial nerve deficit. She exhibits normal muscle tone. Coordination normal.        Pt is not delerious.   Skin: Skin is warm. Rash noted. No pallor.        Spider talangectasia present over neck area.       Psychiatric: She has a normal mood and affect. Her behavior is normal. Judgment and thought content normal.       ASSESSMENT and PLAN    Breanna Lester was seen today for  new patient, other and sinus infection.    Diagnoses and associated orders for this visit:    Cirrhosis of liver  - bumetanide (BUMEX) tablet; Take 2 Tabs by mouth daily.  - potassium chloride (K-DUR, KLOR-CON) SR tablet; Take 1 Tab by mouth two (2) times a day.  - LACTULOSE 10 GRAM/15 ML ORAL SOLN; Take 15 mL by mouth two (2) times a day.    Refill given to all medicine.   Adv to follow up with transplant docotr and avoid medicine as much as possible.    Chronic hepatic failure  - bumetanide (BUMEX) tablet; Take 2 Tabs by mouth daily.  - potassium chloride (K-DUR, KLOR-CON) SR tablet; Take 1 Tab by mouth two (2) times a day.  - LACTULOSE 10 GRAM/15 ML ORAL SOLN; Take 15 mL by mouth two (2) times a day.  - CBC W/ AUTOMATED DIFF; Future  - HEPATIC FUNCTION PANEL; Future  - INR w PROTHROMBIN TIME; Future  - METABOLIC PANEL, BASIC; Future    Sinusitis acute  - amoxicillin (AMOXIL)  250 mg capsule; Take 1 Cap by mouth three (3) times daily for 7 days. With food.If pt can not tolerate it adv to call back.  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

## 2008-10-29 LAB — TROPONIN I: Troponin-I, Qt.: <0.04 ng/mL (ref 0.0–0.05)

## 2008-10-29 LAB — CBC WITH AUTOMATED DIFF
ABS. BASOPHILS: 0.1 10*3/uL (ref 0.0–0.1)
ABS. EOSINOPHILS: 0.3 10*3/uL (ref 0.0–0.4)
ABS. LYMPHOCYTES: 1.8 10*3/uL (ref 0.8–3.5)
ABS. MONOCYTES: 0.6 10*3/uL (ref 0.0–1.0)
ABS. NEUTROPHILS: 3.6 10*3/uL (ref 1.8–8.0)
BASOPHILS: 1 % (ref 0–1)
EOSINOPHILS: 4 % (ref 0–7)
HCT: 29.1 % — ABNORMAL LOW (ref 35.0–47.0)
HGB: 9.5 g/dL — ABNORMAL LOW (ref 11.5–16.0)
LYMPHOCYTES: 28 % (ref 12–49)
MCH: 27.2 PG (ref 26.0–34.0)
MCHC: 32.6 g/dL (ref 30.0–36.5)
MCV: 83.4 FL (ref 80.0–99.0)
MONOCYTES: 10 % (ref 5–13)
NEUTROPHILS: 57 % (ref 32–75)
PLATELET: 90 10*3/uL — ABNORMAL LOW (ref 150–400)
RBC: 3.49 M/uL — ABNORMAL LOW (ref 3.80–5.20)
RDW: 16.3 % — ABNORMAL HIGH (ref 11.5–14.5)
WBC: 6.3 10*3/uL (ref 3.6–11.0)

## 2008-10-29 LAB — HEPATIC FUNCTION PANEL
A-G Ratio: 0.9 — ABNORMAL LOW (ref 1.1–2.2)
ALT (SGPT): 32 U/L (ref 30–65)
AST (SGOT): 50 U/L — ABNORMAL HIGH (ref 15–37)
Albumin: 3.2 g/dL — ABNORMAL LOW (ref 3.5–5.0)
Alk. phosphatase: 285 U/L — ABNORMAL HIGH (ref 50–136)
Bilirubin, direct: 1.3 MG/DL — ABNORMAL HIGH (ref 0.0–0.2)
Bilirubin, total: 2.5 MG/DL — ABNORMAL HIGH (ref 0.2–1.0)
Globulin: 3.7 g/dL (ref 2.0–4.0)
Protein, total: 6.9 g/dL (ref 6.4–8.2)

## 2008-10-29 LAB — METABOLIC PANEL, BASIC
Anion gap: 12 mmol/L (ref 5–15)
BUN/Creatinine ratio: 14 (ref 12–20)
BUN: 14 MG/DL (ref 6–20)
CO2: 25 MMOL/L (ref 21–32)
Calcium: 8.9 MG/DL (ref 8.5–10.1)
Chloride: 103 MMOL/L (ref 97–108)
Creatinine: 1 MG/DL (ref 0.6–1.3)
GFR est AA: >60 mL/min/{1.73_m2} (ref >60)
GFR est non-AA: >60 mL/min/{1.73_m2} (ref >60)
Glucose: 94 MG/DL (ref 50–100)
Potassium: 2.8 MMOL/L — ABNORMAL LOW (ref 3.5–5.1)
Sodium: 140 MMOL/L (ref 136–145)

## 2008-10-29 LAB — METABOLIC PANEL, COMPREHENSIVE
A-G Ratio: 0.8 — ABNORMAL LOW (ref 1.1–2.2)
ALT (SGPT): 33 U/L (ref 30–65)
AST (SGOT): 44 U/L — ABNORMAL HIGH (ref 15–37)
Albumin: 3.2 g/dL — ABNORMAL LOW (ref 3.5–5.0)
Alk. phosphatase: 276 U/L — ABNORMAL HIGH (ref 50–136)
Anion gap: 9 mmol/L (ref 5–15)
BUN/Creatinine ratio: 13 (ref 12–20)
BUN: 12 MG/DL (ref 6–20)
Bilirubin, total: 2.5 MG/DL — ABNORMAL HIGH (ref 0.2–1.0)
CO2: 29 MMOL/L (ref 21–32)
Calcium: 8.7 MG/DL (ref 8.5–10.1)
Chloride: 102 MMOL/L (ref 97–108)
Creatinine: 0.9 MG/DL (ref 0.6–1.3)
GFR est AA: >60 mL/min/{1.73_m2} (ref >60)
GFR est non-AA: >60 mL/min/{1.73_m2} (ref >60)
Globulin: 3.8 g/dL (ref 2.0–4.0)
Glucose: 105 MG/DL — ABNORMAL HIGH (ref 50–100)
Potassium: 2.6 MMOL/L — CL (ref 3.5–5.1)
Protein, total: 7 g/dL (ref 6.4–8.2)
Sodium: 140 MMOL/L (ref 136–145)

## 2008-10-29 LAB — CK W/ CKMB & INDEX
CK - MB: 7.6 NG/ML — ABNORMAL HIGH (ref 0–5)
CK-MB Index: 6.7 — ABNORMAL HIGH (ref 0–2.5)
CK: 113 U/L (ref 21–215)

## 2008-10-29 LAB — URINALYSIS W/ REFLEX CULTURE
Bilirubin UA, confirm: NEGATIVE
Glucose: NEGATIVE MG/DL
Ketone: NEGATIVE MG/DL
Leukocyte Esterase: NEGATIVE
Nitrites: NEGATIVE
Protein: NEGATIVE MG/DL
Specific gravity: 1.01 (ref 1.003–1.030)
Urobilinogen: 1 EU/DL (ref 0.2–1.0)
pH (UA): 6 (ref 5.0–8.0)

## 2008-10-29 LAB — PROTHROMBIN TIME + INR
INR: 1.4 — ABNORMAL HIGH (ref 0.9–1.1)
Prothrombin time: 13.6 SECS — ABNORMAL HIGH (ref 9.0–11.0)

## 2008-10-29 LAB — MAGNESIUM: Magnesium: 1.6 MG/DL (ref 1.6–2.4)

## 2008-10-29 LAB — PTT: aPTT: 29.4 s (ref 24.0–33.0)

## 2008-10-29 NOTE — Telephone Encounter (Signed)
Close encounter - this amy be a duplicate message.

## 2008-10-29 NOTE — Telephone Encounter (Signed)
Left message at pt's home (this a.m.) for pt to call office - lab results

## 2008-10-29 NOTE — Progress Notes (Signed)
Quick Note:    Pt need to take potasium tablet 2 tab BID for next 3 days then 1 tablet BID She is taking various herbal and vitamin products,. Potasium level is very low.Her liver function is slowly decorating,need to see transplant doctor.  ______

## 2008-10-30 LAB — POTASSIUM: Potassium: 3.1 MMOL/L — ABNORMAL LOW (ref 3.5–5.1)

## 2008-10-30 LAB — POC CHEM8
Anion gap (POC): 15 (ref 5–15)
BUN (POC): 11 MG/DL (ref 9–20)
CO2 (POC): 27 MMOL/L (ref 21–32)
Calcium, ionized (POC): 1.16 MMOL/L (ref 1.12–1.32)
Chloride (POC): 104 MMOL/L (ref 98–107)
Creatinine (POC): 0.9 MG/DL (ref 0.6–1.3)
GFRAA, POC: >60 mL/min/{1.73_m2} (ref >60)
GFRNA, POC: >60 mL/min/{1.73_m2} (ref >60)
Glucose (POC): 135 MG/DL — ABNORMAL HIGH (ref 65–105)
Hematocrit (POC): 28 % — ABNORMAL LOW (ref 35.0–47.0)
Hemoglobin (POC): 9.5 GM/DL — ABNORMAL LOW (ref 11.5–16.0)
Potassium (POC): 3.2 MMOL/L — ABNORMAL LOW (ref 3.5–5.1)
Sodium (POC): 141 MMOL/L (ref 137–145)

## 2008-10-30 NOTE — Progress Notes (Signed)
Quick Note:    Dr. Karie Mainland. Notified pt of lab results and discussed instructuions.  ______

## 2008-10-30 NOTE — Telephone Encounter (Addendum)
I will call pt today.

## 2008-10-30 NOTE — Telephone Encounter (Signed)
Message copied by Charlott Rakes on Fri Oct 30, 2008  1:09 PM  ------       Message from: Mingo Amber       Created: Thu Oct 29, 2008 10:56 AM         Pt need to take potasium tablet 2 tab BID for next 3 days then 1 tablet BID She is taking various herbal and vitamin products,. Potasium level is very low.Her liver function is slowly decorating,need to see transplant doctor.

## 2008-10-30 NOTE — Telephone Encounter (Addendum)
Talked with pt.She came back from hospital.She started vomiting.She received IV potasium and magnesium.repeat K was 3.2.Adv her to take Kcl 20 meq 2 tablet BID for 3 days then go back to her regular dose 20 meq BID.  Will repeat K in Monday.

## 2008-10-31 LAB — CULTURE, URINE
Colonies Counted: <1000
Colony Count: <1000
Culture result:: NO GROWTH
Culture: NO GROWTH

## 2008-11-06 LAB — METABOLIC PANEL, COMPREHENSIVE
A-G Ratio: 0.8 — ABNORMAL LOW (ref 1.1–2.2)
A-G Ratio: 0.8 — ABNORMAL LOW (ref 1.1–2.2)
ALT (SGPT): 30 U/L (ref 30–65)
ALT (SGPT): 31 U/L (ref 30–65)
AST (SGOT): 28 U/L (ref 15–37)
AST (SGOT): 29 U/L (ref 15–37)
Albumin: 3.2 g/dL — ABNORMAL LOW (ref 3.5–5.0)
Albumin: 3.2 g/dL — ABNORMAL LOW (ref 3.5–5.0)
Alk. phosphatase: 284 U/L — ABNORMAL HIGH (ref 50–136)
Alk. phosphatase: 293 U/L — ABNORMAL HIGH (ref 50–136)
Anion gap: 12 mmol/L (ref 5–15)
Anion gap: 9 mmol/L (ref 5–15)
BUN/Creatinine ratio: 8 — ABNORMAL LOW (ref 12–20)
BUN/Creatinine ratio: 8 — ABNORMAL LOW (ref 12–20)
BUN: 7 MG/DL (ref 6–20)
BUN: 8 MG/DL (ref 6–20)
Bilirubin, total: 2.3 MG/DL — ABNORMAL HIGH (ref 0.2–1.0)
Bilirubin, total: 2.3 MG/DL — ABNORMAL HIGH (ref 0.2–1.0)
CO2: 27 MMOL/L (ref 21–32)
CO2: 27 MMOL/L (ref 21–32)
Calcium: 8.3 MG/DL — ABNORMAL LOW (ref 8.5–10.1)
Calcium: 8.3 MG/DL — ABNORMAL LOW (ref 8.5–10.1)
Chloride: 100 MMOL/L (ref 97–108)
Chloride: 103 MMOL/L (ref 97–108)
Creatinine: 0.9 MG/DL (ref 0.6–1.3)
Creatinine: 1 MG/DL (ref 0.6–1.3)
GFR est AA: >60 mL/min/{1.73_m2} (ref >60)
GFR est AA: >60 mL/min/{1.73_m2} (ref >60)
GFR est non-AA: >60 mL/min/{1.73_m2} (ref >60)
GFR est non-AA: >60 mL/min/{1.73_m2} (ref >60)
Globulin: 3.8 g/dL (ref 2.0–4.0)
Globulin: 3.8 g/dL (ref 2.0–4.0)
Glucose: 102 MG/DL — ABNORMAL HIGH (ref 50–100)
Glucose: 98 MG/DL (ref 50–100)
Potassium: 3.1 MMOL/L — ABNORMAL LOW (ref 3.5–5.1)
Potassium: 3.1 MMOL/L — ABNORMAL LOW (ref 3.5–5.1)
Protein, total: 7 g/dL (ref 6.4–8.2)
Protein, total: 7 g/dL (ref 6.4–8.4)
Sodium: 139 MMOL/L (ref 136–145)
Sodium: 139 MMOL/L (ref 136–145)

## 2008-11-06 LAB — CBC WITH AUTOMATED DIFF
ABS. BASOPHILS: 0 10*3/uL (ref 0.0–0.1)
ABS. EOSINOPHILS: 0.9 10*3/uL — ABNORMAL HIGH (ref 0.0–0.4)
ABS. LYMPHOCYTES: 1.4 10*3/uL (ref 0.8–3.5)
ABS. MONOCYTES: 0.7 10*3/uL (ref 0.0–1.0)
ABS. NEUTROPHILS: 6.7 10*3/uL (ref 1.8–8.0)
BASOPHILS: 0 % (ref 0–1)
EOSINOPHILS: 9 % — ABNORMAL HIGH (ref 0–7)
HCT: 29.5 % — ABNORMAL LOW (ref 35.0–47.0)
HGB: 10.1 g/dL — ABNORMAL LOW (ref 11.5–16.0)
LYMPHOCYTES: 14 % (ref 12–49)
MCH: 28.6 PG (ref 26.0–34.0)
MCHC: 34.2 g/dL (ref 30.0–36.5)
MCV: 83.6 FL (ref 80.0–99.0)
MONOCYTES: 7 % (ref 5–13)
NEUTROPHILS: 70 % (ref 32–75)
PLATELET: 119 10*3/uL — ABNORMAL LOW (ref 150–400)
RBC: 3.53 M/uL — ABNORMAL LOW (ref 3.80–5.20)
RDW: 17 % — ABNORMAL HIGH (ref 11.5–14.5)
WBC COMMENTS: REACTIVE
WBC: 9.7 10*3/uL (ref 3.6–11.0)

## 2008-11-06 LAB — BNP: BNP: 126 pg/mL — ABNORMAL HIGH (ref 0–100)

## 2008-11-06 LAB — LIPASE: Lipase: 170 U/L (ref 73–393)

## 2008-11-06 LAB — URINALYSIS W/ REFLEX CULTURE
Bacteria: NEGATIVE /HPF
Bilirubin: NEGATIVE
Blood: NEGATIVE
Glucose: NEGATIVE MG/DL
Ketone: NEGATIVE MG/DL
Nitrites: NEGATIVE
Protein: NEGATIVE MG/DL
Specific gravity: 1.015 (ref 1.003–1.030)
Urobilinogen: 0.2 EU/DL (ref 0.2–1.0)
pH (UA): 6.5 (ref 5.0–8.0)

## 2008-11-06 LAB — AMYLASE: Amylase: 42 U/L (ref 25–115)

## 2008-11-06 LAB — TROPONIN I: Troponin-I, Qt.: <0.04 ng/mL (ref 0.0–0.05)

## 2008-11-06 LAB — AMMONIA: Ammonia, plasma: 34 umol/L — ABNORMAL HIGH (ref 11–32)

## 2008-11-06 LAB — PROTHROMBIN TIME + INR
INR: 1.4 — ABNORMAL HIGH (ref 0.9–1.1)
Prothrombin time: 14.1 SECS — ABNORMAL HIGH (ref 9.0–11.0)

## 2008-11-06 LAB — PTT: aPTT: 30.7 s (ref 24.0–33.0)

## 2008-11-06 LAB — LACTIC ACID: Lactic acid: 0.8 MMOL/L (ref 0.4–2.0)

## 2008-11-06 LAB — CK W/ REFLX CKMB: CK: 102 U/L (ref 21–215)

## 2008-11-06 NOTE — Progress Notes (Signed)
The patient presents with acute care symptoms, stomach and back pain, fluid retention, fever, non productive cough,sob,diarrhea  started last night, wheezing.  Breanna Lester  11/06/08

## 2008-11-06 NOTE — Progress Notes (Signed)
HISTORY OF PRESENT ILLNESS  Breanna Lester is a 48 y.o. female here in my office with complain of SOB,increased in abdominal girth ans abdominal pain since last night.She had fever of 103"F with shaking chill last night.Since then she is having diarrhea.Pt is very uncomfortable.Her looks became swollen too.She is not confused,accompanied by her husband.She is having diarrhea since yesterday.  HPI    Review of Systems   Constitutional: Positive for fever and diaphoresis.   HENT: Negative.    Eyes: Negative.    Respiratory: Positive for shortness of breath.    Cardiovascular: Negative.    Gastrointestinal: Positive for abdominal pain and diarrhea. Negative for blood in stool.   Genitourinary: Negative.    Musculoskeletal: Positive for joint pain. Negative for falls.   Skin: Negative.    Neurological: Positive for tremors and weakness. Negative for sensory change, speech change, focal weakness, seizures and loss of consciousness.   Endo/Heme/Allergies: Bruises/bleeds easily.   Psychiatric/Behavioral: The patient is nervous/anxious.        Physical Exam   Constitutional: She is oriented. She appears well-developed and well-nourished. She appears distressed.   HENT:   Head: Normocephalic and atraumatic.   Right Ear: External ear normal.   Left Ear: External ear normal.   Nose: Nose normal.   Mouth/Throat: Oropharynx is clear and moist. No oropharyngeal exudate.   Eyes: Extraocular motions are normal. Pupils are equal, round, and reactive to light. Scleral icterus is present.   Neck: Normal range of motion. Neck supple. No JVD present. No thyromegaly present.   Cardiovascular: Normal rate, regular rhythm, normal heart sounds and intact distal pulses.         High volume pulse.   Pulmonary/Chest: Breath sounds normal. She is in respiratory distress. She has no wheezes. She has no rales.   Abdominal: Bowel sounds are normal. She exhibits distension. Tenderness is present. She has no rebound and no guarding.         Ascitis present.Diffuse tenderness all over abdomen.   Musculoskeletal: She exhibits edema and tenderness.        LLE++ edema both legs.    Joint pain prsent all large joints.   Lymphadenopathy:     She has no cervical adenopathy.   Neurological: She is alert and oriented. She has normal reflexes. She displays normal reflexes. No cranial nerve deficit. She exhibits normal muscle tone. Coordination normal.        Flapping tremor present.Pt is not delirious yet.   Skin: Skin is warm. Rash noted. She is diaphoretic.   Psychiatric: She has a normal mood and affect. Her behavior is normal. Judgment and thought content normal.       ASSESSMENT and PLAN  Luane was seen today for diarrhea.    Diagnoses and associated orders for this visit:    Sbp (spontaneous bacterial peritonitis)  Pt need to go to Er for abdominal paracentesis to send fluid for PMN and culture.She might develop SBP.Need to start on cefetoxime or IN rocephin after ascitic fluid culture.      Ascites  She need to start on lasix along with Bumex.    Liver failure  She has appointment for [facility] transplant team next week.

## 2008-11-06 NOTE — H&P (Addendum)
Name: Breanna Lester, Breanna Lester Admitted: 11/06/2008  MR #: [id number] DOB: 01-Jul-1961  Account #: [id number] Age: 48  Physician: Mellody Drown, M.D. Location:     HISTORY PHYSICAL      PRIMARY CARE PHYSICIAN: Mingo Amber, MD    HISTORY OF PRESENT ILLNESS: This is a 48 year old female who presents to  [facility] from her primary care physician's office with the complaint of  dyspnea on exertion as well as cough and fever. The patient has had these  symptoms ongoing since yesterday. Her primary care physician sent her to  the emergency room out of concern that it was perhaps ascites causing her  shortness of breath. Here in the emergency room, she has had an ultrasound  of the abdomen which reveals no evidence of ascites. Her chest x-ray does  reveal what appears to be some pulmonary vascular congestion or perhaps  infection.    PAST MEDICAL HISTORY: Cryptogenic cirrhosis.    PAST SURGICAL HISTORY:  1. C-section.  2. Hysterectomy.  3. Tonsillectomy.    SOCIAL HISTORY: The patient does not drink or use drugs. Lives with her  husband.    FAMILY HISTORY: Negative for liver problems.    REVIEW OF SYSTEMS: Significant for some fatigue and myalgias. Otherwise is  as per HPI.    CURRENT MEDICATIONS:  1. Lactulose 15 mL twice a day.  2. Klor-Con 20 mEq daily.  3. Aldactone 50 mg daily.  4. Zoloft 100 mg daily.  5. Bumex 1 mg b.i.d.  6. Patient just finished a course of amoxicillin.    ALLERGIES: Patient has allergies to LATEX and TYLENOL.    PHYSICAL EXAMINATION:  VITAL SIGNS: Patient is afebrile. Blood pressure 140s over 50s, pulse in  the 80s, respiratory rate of 14, oxygen saturation 98% on room air.  GENERAL: This is a white female in no distress, sitting on the edge of the  bed.  HEENT: Normocephalic, atraumatic. Moist mucous membranes. Oropharynx is  clear. Left pupil is reactive. Right pupil is fixed. Apparently, it has  been like this for some time.  NECK: Supple.   CARDIOVASCULAR: Regular rhythm with normal rate. There are murmurs, rubs,  or gallops.  LUNGS: Perhaps some bibasilar crackles. There is no accessory muscle use.  ABDOMEN: Fairly marked hepatomegaly. Minimal tenderness in the right upper  quadrant. Adequate bowel sounds.  EXTREMITIES: Examination of the lower extremities reveals no evidence of  edema, 2+ dorsalis pedis pulses bilaterally.  SKIN: No evidence of rash. Skin is warm and dry.  NEUROLOGIC: Grossly nonfocal. Cranial nerves 2 through 12 are intact. No  asterixis.  PSYCHIATRIC: Alert and oriented x3 and appropriate.    LABORATORY DATA: CBC reveals a white count of 9.7, a hemoglobin of 10.1,  platelet count of 119. Chemistry is significant a potassium of 3.1;  otherwise, basic metabolic panel is normal. LFTs reveal a bilirubin of 2.3.  AST of 28, ALT of 30, alkaline phosphatase of 293. Amylase and lipase  normal. Ammonia 34. BNP 126. Cardiac enzymes negative. Urinalysis negative  for infection. Chest x-ray reveals pulmonary vascular congestive changes  versus possible interstitial infiltrate. Ultrasound of the abdomen reveals  no ascites. There is sludge in the gallbladder with no evident  cholelithiasis. CT angiogram of the chest reveals no pulmonary embolism.  There is large cardiac size. There is patchy bilateral ground-glass  airspace opacities which represent pulmonary edema versus a nonspecific  infectious process. There is a dilated main pulmonary artery suggestive of  pulmonary hypertension.  ASSESSMENT: This is a 48 year old female who presents with dyspnea, the  etiology of which is not clear. The differential includes the possibility  of volume overload versus infectious process.    PLAN:  1. The patient has dyspnea. We will admit her to the telemetry unit. We  will treat for volume overload with intravenous Bumex. We will also  continue Aldactone and check a 2-D echocardiogram. We will also treat for   pneumonia with Rocephin and azithromycin and follow up on the patient's  cultures.  2. The patient has a history of cirrhosis. Again, we will be continuing  diuretics. We will continue lactulose. We will obtain consultation with  Gastroenterology.    [employer] thanks you for allowing [place] to  participate in the care of this patient.        E-Signed By  Mellody Drown, M.D. 11/30/2008 08:18    Mellody Drown, M.D.    cc: Mingo Amber   [name], MD   Mellody Drown, M.D.        MS/WMX; D: 11/06/2008 7:29 P; T: 11/06/2008 8:44 P; DOC# [id number]; Job#  [id number]

## 2008-11-07 LAB — CK W/ CKMB & INDEX
CK - MB: 3.3 NG/ML (ref 0–5)
CK-MB Index: 4.3 — ABNORMAL HIGH (ref 0–2.5)
CK: 77 U/L (ref 21–215)

## 2008-11-07 LAB — METABOLIC PANEL, COMPREHENSIVE
A-G Ratio: 0.8 — ABNORMAL LOW (ref 1.1–2.2)
ALT (SGPT): 29 U/L — ABNORMAL LOW (ref 30–65)
AST (SGOT): 24 U/L (ref 15–37)
Albumin: 2.7 g/dL — ABNORMAL LOW (ref 3.5–5.0)
Alk. phosphatase: 232 U/L — ABNORMAL HIGH (ref 50–136)
Anion gap: 12 mmol/L (ref 5–15)
BUN/Creatinine ratio: 9 — ABNORMAL LOW (ref 12–20)
BUN: 7 MG/DL (ref 6–20)
Bilirubin, total: 2.9 MG/DL — ABNORMAL HIGH (ref 0.2–1.0)
CO2: 24 MMOL/L (ref 21–32)
Calcium: 7.7 MG/DL — ABNORMAL LOW (ref 8.5–10.1)
Chloride: 100 MMOL/L (ref 97–108)
Creatinine: 0.8 MG/DL (ref 0.6–1.3)
GFR est AA: >60 mL/min/{1.73_m2} (ref >60)
GFR est non-AA: >60 mL/min/{1.73_m2} (ref >60)
Globulin: 3.5 g/dL (ref 2.0–4.0)
Glucose: 135 MG/DL — ABNORMAL HIGH (ref 50–100)
Potassium: 3.1 MMOL/L — ABNORMAL LOW (ref 3.5–5.1)
Protein, total: 6.2 g/dL — ABNORMAL LOW (ref 6.4–8.4)
Sodium: 136 MMOL/L (ref 136–145)

## 2008-11-07 LAB — HEPATIC FUNCTION PANEL
A-G Ratio: 0.8 — ABNORMAL LOW (ref 1.1–2.2)
ALT (SGPT): 28 U/L — ABNORMAL LOW (ref 30–65)
AST (SGOT): 36 U/L (ref 15–37)
Albumin: 2.7 g/dL — ABNORMAL LOW (ref 3.5–5.0)
Alk. phosphatase: 223 U/L — ABNORMAL HIGH (ref 50–136)
Bilirubin, direct: 1.2 MG/DL — ABNORMAL HIGH (ref 0.0–0.2)
Bilirubin, total: 3 MG/DL — ABNORMAL HIGH (ref 0.2–1.0)
Globulin: 3.6 g/dL (ref 2.0–4.0)
Protein, total: 6.3 g/dL — ABNORMAL LOW (ref 6.4–8.4)

## 2008-11-07 LAB — AMMONIA: Ammonia, plasma: 57 umol/L — ABNORMAL HIGH (ref 11–32)

## 2008-11-07 LAB — CBC WITH AUTOMATED DIFF
ABS. BASOPHILS: 0 10*3/uL (ref 0.0–0.1)
ABS. EOSINOPHILS: 1.2 10*3/uL — ABNORMAL HIGH (ref 0.0–0.4)
ABS. LYMPHOCYTES: 1.3 10*3/uL (ref 0.8–3.5)
ABS. MONOCYTES: 0.9 10*3/uL (ref 0.0–1.0)
ABS. NEUTROPHILS: 7.4 10*3/uL (ref 1.8–8.0)
BASOPHILS: 0 % (ref 0–1)
EOSINOPHILS: 11 % — ABNORMAL HIGH (ref 0–7)
HCT: 27.5 % — ABNORMAL LOW (ref 35.0–47.0)
HGB: 9 g/dL — ABNORMAL LOW (ref 11.5–16.0)
LYMPHOCYTES: 12 % (ref 12–49)
MCH: 27.9 PG (ref 26.0–34.0)
MCHC: 32.7 g/dL (ref 30.0–36.5)
MCV: 85.1 FL (ref 80.0–99.0)
MONOCYTES: 8 % (ref 5–13)
NEUTROPHILS: 69 % (ref 32–75)
PLATELET: 122 10*3/uL — ABNORMAL LOW (ref 150–400)
RBC: 3.23 M/uL — ABNORMAL LOW (ref 3.80–5.20)
RDW: 17.2 % — ABNORMAL HIGH (ref 11.5–14.5)
WBC: 10.8 10*3/uL (ref 3.6–11.0)

## 2008-11-07 LAB — PTT: aPTT: 35 s — ABNORMAL HIGH (ref 24.0–33.0)

## 2008-11-07 LAB — PROTHROMBIN TIME + INR
INR: 1.7 — ABNORMAL HIGH (ref 0.9–1.1)
Prothrombin time: 17.5 SECS — ABNORMAL HIGH (ref 9.0–11.0)

## 2008-11-07 LAB — TROPONIN I: Troponin-I, Qt.: <0.04 ng/mL (ref 0.0–0.05)

## 2008-11-07 LAB — PHOSPHORUS: Phosphorus: 2.6 MG/DL (ref 2.5–4.9)

## 2008-11-07 NOTE — Consults (Signed)
Name: Breanna Lester, Breanna Lester Admitted: 11/06/2008  MR #: [id number] DOB: 05/14/1961  Account #: [id number] Age: 48  Consultant: Ollen Gross. Archie Patten, M.D. Location: PTU 418 A     CONSULTATION REPORT    DATE OF CONSULTATION:  REFERRING PHYSICIAN:      ATTENDING PHYSICIAN: [facility].    CONSULTING PHYSICIAN: Johny Sax, MD    REASON FOR CONSULTATION: Asked to evaluate this 48 year old female because  of abdominal pain and history of cirrhosis.    IMPRESSION:  1. Cryptogenic cirrhosis. She has had extensive evaluation in the last 2  years for cirrhosis both at [facility] and [facility], apparently was found to have  cryptogenic cirrhosis. Her liver function tests have been relatively  stable compared to what they were last year at [facility]. There are no acute  changes revealed and no significant ascites and ultrasound here. She is  due to be evaluated at [facility] for transplantation next week, and  I would continue with that evaluation.  2. Fluid overload. She appears to have had some shortness of breath which  is improved with fluid overload. She has had recurrent problems with  ascites and fluid overload. At this time she has no ascites and I think  she appears to have diuresed an adequate amount.  3. Abdominal pain. This abdominal pain appears to be musculoskeletal to  me. It radiates around to the back and has been chronic. It is worse with  movement. The pain is referred to the back. She has a long history of  sludge in her gallbladder. Ultrasounds at [facility] have shown this before, and  she had a negative HIDA scan about 9 months ago. Apparently they have  considered doing a cholecystectomy but elected not to do it. There is no  thickening of the gallbladder wall, and I doubt that she has acute  cholecystitis at this time. For now I would treat the symptoms  symptomatically unless pain increases. She is being seen at [facility] next week, and they can reevaluate this at that time.    RECOMMENDATIONS:   1. Symptomatic treatment.  2. Continue diuresis.  3. Follow up with [facility].    HISTORY OF PRESENT ILLNESS: This 48 year old female, patient of [name],  has history of cryptogenic cirrhosis, unclear etiology. She was  hospitalized and seen multiple times in 2008 and 2009 at [facility]. She has had  recurrent problems with shortness of breath and ascites and was evaluated  at [facility]. She apparently after a transfusion at [facility] had respiratory failure  and required prolonged intubation with acute respiratory distress syndrome.  She now presents with increasing shortness of breath and right upper  quadrant pain with radiation around to the back. She has had this pain  intermittently, has had a long history of back problems. She thinks this  pain is more severe in the epigastric region than it had been before. She  has been evaluated for gallbladder disease and has had a previous  ultrasound at [facility] that showed sludge. She has had a negative HIDA scan  there 9 months ago. They had considered cholecystectomy but elected not to  do it as they do not feel this was causing her abdominal pain at that  time.    PAST HISTORY: Remarkable for no history of previous hepatitis, alcohol, or  intravenous drug use. She does have a long history of irritable bowel  disease, reflux, and history of pneumonia in the past.    MEDICATIONS  INCLUDE:  1. Lactulose 15 mL twice a day.  2. K-lor 20 mEq daily.  3. Aldactone 50 mg daily.  4. Zoloft 100 mg daily.  5. Bumex b.i.d.    ALLERGIES: LATEX and TYLENOL.    HABITS: Smoking none, alcohol none.    FAMILY HISTORY: Otherwise unremarkable.    REVIEW OF SYSTEMS: Otherwise negative except for the problems as noted.    PHYSICAL EXAMINATION:  GENERAL: A chronically ill-appearing female with some scleral icterus.  HEENT: Unremarkable otherwise.  NECK: Supple.  LUNGS: Clear to auscultation and percussion.  SKIN: Does reveal scattered spider angiomata on the upper chest. There is  palmar erythema.   CARDIOVASCULAR: A 2/6 systolic ejection murmur.  ABDOMEN: Soft. There is some mild right upper quadrant epigastric  tenderness. Liver is palpable 2 fingerbreadths below right costal margin.  Bowel sounds are normal.  EXTREMITIES: No clubbing, cyanosis, or edema.  NEUROLOGIC: There is no asterixis at this time.    Thank you for asking me to see this consultation.      E-Signed By  Ollen Gross. Archie Patten, M.D. 11/24/2008 17:19    Ollen Gross. Archie Patten, M.D.    cc: [name], MD   Ollen Gross. Archie Patten, M.D.        PSM/WMX; D: 11/07/2008 12:06 P; T: 11/07/2008 6:52 P; DOC# [id number]; Job#  [id number]

## 2008-11-08 LAB — CULTURE, URINE
Colonies Counted: 30000
Colony Count: 30000

## 2008-11-11 LAB — CULTURE, BLOOD, PAIRED
Culture result:: NO GROWTH
Report Status: 3242010

## 2008-11-12 LAB — LIPID PANEL
CHOL/HDL Ratio: 3.9 (ref 0–5.0)
Cholesterol, total: 168 MG/DL (ref <200)
HDL Cholesterol: 43 MG/DL (ref 40–60)
LDL, calculated: 110 MG/DL — ABNORMAL HIGH (ref 0–100)
Triglyceride: 75 MG/DL (ref 30–200)
VLDL, calculated: 15 MG/DL

## 2008-11-17 NOTE — Telephone Encounter (Signed)
Pt returned call (Dr. Karie Mainland was asking about her).  Pt says she feels "OK" and has seen transplant specialist at [facility] and they are setting her up for testing related to a transplant.  She is very appreciative of Dr. Ma Hillock call.

## 2008-11-17 NOTE — Telephone Encounter (Signed)
Calling to check on pt.

## 2008-11-20 LAB — URINALYSIS W/ REFLEX CULTURE
Bacteria: NEGATIVE /HPF
Bilirubin: NEGATIVE
Blood: NEGATIVE
Glucose: NEGATIVE MG/DL
Ketone: NEGATIVE MG/DL
Leukocyte Esterase: NEGATIVE
Nitrites: NEGATIVE
Protein: NEGATIVE MG/DL
Specific gravity: 1.012 (ref 1.003–1.030)
Urobilinogen: 1 EU/DL (ref 0.2–1.0)
pH (UA): 6.5 (ref 5.0–8.0)

## 2008-11-20 LAB — METABOLIC PANEL, COMPREHENSIVE
A-G Ratio: 0.7 — ABNORMAL LOW (ref 1.1–2.2)
ALT (SGPT): 35 U/L (ref 12–78)
AST (SGOT): 52 U/L — ABNORMAL HIGH (ref 15–37)
Albumin: 3.3 g/dL — ABNORMAL LOW (ref 3.5–5.0)
Alk. phosphatase: 247 U/L — ABNORMAL HIGH (ref 50–136)
Anion gap: 12 mmol/L (ref 5–15)
BUN/Creatinine ratio: 12 (ref 12–20)
BUN: 12 MG/DL (ref 6–20)
Bilirubin, total: 1.9 MG/DL — ABNORMAL HIGH (ref 0.2–1.0)
CO2: 27 MMOL/L (ref 21–32)
Calcium: 8.8 MG/DL (ref 8.5–10.1)
Chloride: 101 MMOL/L (ref 97–108)
Creatinine: 1 MG/DL (ref 0.6–1.3)
GFR est AA: >60 mL/min/{1.73_m2} (ref >60)
GFR est non-AA: >60 mL/min/{1.73_m2} (ref >60)
Globulin: 4.7 g/dL — ABNORMAL HIGH (ref 2.0–4.0)
Glucose: 86 MG/DL (ref 65–100)
Potassium: 3.3 MMOL/L — ABNORMAL LOW (ref 3.5–5.1)
Protein, total: 8 g/dL (ref 6.4–8.2)
Sodium: 140 MMOL/L (ref 136–145)

## 2008-11-20 LAB — CBC WITH AUTOMATED DIFF
ABS. BASOPHILS: 0.1 10*3/uL (ref 0.0–0.1)
ABS. EOSINOPHILS: 0.8 10*3/uL — ABNORMAL HIGH (ref 0.0–0.4)
ABS. LYMPHOCYTES: 2.3 10*3/uL (ref 0.8–3.5)
ABS. MONOCYTES: 0.7 10*3/uL (ref 0.0–1.0)
ABS. NEUTROPHILS: 6.6 10*3/uL (ref 1.8–8.0)
BASOPHILS: 1 % (ref 0–1)
EOSINOPHILS: 7 % (ref 0–7)
HCT: 33.6 % — ABNORMAL LOW (ref 35.0–47.0)
HGB: 10.8 g/dL — ABNORMAL LOW (ref 11.5–16.0)
LYMPHOCYTES: 22 % (ref 12–49)
MCH: 27.2 PG (ref 26.0–34.0)
MCHC: 32.1 g/dL (ref 30.0–36.5)
MCV: 84.6 FL (ref 80.0–99.0)
MONOCYTES: 7 % (ref 5–13)
NEUTROPHILS: 63 % (ref 32–75)
PLATELET: 159 10*3/uL (ref 150–400)
RBC: 3.97 M/uL (ref 3.80–5.20)
RDW: 15.8 % — ABNORMAL HIGH (ref 11.5–14.5)
WBC: 10.5 10*3/uL (ref 3.6–11.0)

## 2008-11-20 LAB — PTT: aPTT: 30 s (ref 24.0–33.0)

## 2008-11-20 LAB — AMMONIA: Ammonia, plasma: 50 umol/L — ABNORMAL HIGH (ref <32)

## 2008-11-20 LAB — PROTHROMBIN TIME + INR
INR: 1.3 — ABNORMAL HIGH (ref 0.9–1.1)
Prothrombin time: 13.4 SECS — ABNORMAL HIGH (ref 9.0–11.0)

## 2008-11-20 LAB — HCG URINE, QL: HCG urine, QL: NEGATIVE

## 2008-11-20 LAB — LIPASE: Lipase: 222 U/L (ref 73–393)

## 2008-11-20 LAB — LACTIC ACID: Lactic acid: 1.2 MMOL/L (ref 0.4–2.0)

## 2008-11-20 LAB — AMYLASE: Amylase: 79 U/L (ref 25–115)

## 2008-11-20 NOTE — H&P (Signed)
Name: Breanna Lester, Breanna Lester Admitted: 11/20/2008  MR #: [id number] DOB: 1960/11/29  Account #: [id number] Age: 48  Physician: Shireen Quan, M.D. Location: [id number]     HISTORY PHYSICAL    PRIMARY CARE PHYSICIAN: Mingo Amber, MD    PRIMARY GASTROENTEROLOGIST: [name], MD    CHIEF COMPLAINT: Abdominal and back pain.    HISTORY OF PRESENT ILLNESS: This 48 year old female with cryptogenic  cirrhosis had presented to the emergency department for an evaluation of  right upper quadrant pain, which radiated through to her back and right  flank. She notes having constant abdominal pain since she was found to  have cryptogenic cirrhosis in January 2009. This episode of pain is  similar to that pain, but greatly increased in intensity. It was 8/10 in  severity at presentation. When it occurred with increased intensity, she  also felt pain over her entire body like one would experience with the flu,  but much worse. She also noted having a low geade fever at home. In the  emergency department, her temperature was normal at 98.2, and her white  blood cell count was normal at 10.5. Blood cultures were drawn in the  emergency department, and the patient received 3.375 g of Zosyn. An  abdominal ultrasound showed sludge in the gallbladder and common bile duct  with dilatation of the common bile duct. The common bile duct dilatation  is a new finding.    PAST MEDICAL HISTORY  1. Cryptogenic cirrhosis.  2. Status post C-section, hysterectomy, and tonsillectomy.  3. Arthritis of the spine.    MEDICATIONS  1. Lactulose 10 mg twice daily.  2. Sertraline 100 mg daily.  3. Bumex 2 mg daily.  4. Spironolactone 200 mg daily.    ALLERGIES: TYLENOL AND LATEX.    SOCIAL HISTORY: She lives with her husband. She does not use tobacco,  alcohol, or illicit drugs.    FAMILY HISTORY: Her father died of congestive heart failure. Her brother  died from diabetes mellitus and congestive heart failure. Her mother died  from cancer.     REVIEW OF SYSTEMS: CONSTITUTIONAL: Positive for fever. EYES: No blurred  vision. EARS, NOSE, MOUTH, AND THROAT: No sinus drainage. CARDIAC: No  chest pain. RESPIRATORY: No productive cough. GI: Positive for  abdominal pain. No nausea, vomiting, or diarrhea. GU: No dysuria.  MUSCULOSKELETAL: The patient had experienced diffuse myalgias and  arthralgias. NEUROLOGIC: Positive for increased somnolence for the 24  hours prior to presentation. SKIN: No rashes or lesions. All other  systems negative.    PHYSICAL EXAMINATION  GENERAL: Reveals a very pleasant female who is alert and oriented x3, and  in no acute distress. There is no accessory muscle use. She is not  anxious or agitated.  VITAL SIGNS: Include a temperature of 98.2, pulse 76, respirations 18,  blood pressure 138/65. Oxygen saturation 98% on room air.  HEENT: Head normocephalic, atraumatic. Pupils equal, round, reactive to  light. Extraocular muscles intact. No conjunctival drainage or scleral  icterus. No external lesions on her nose or ears. Oral mucosa moist.  NECK: Supple. Trachea midline. No masses or palpable lymph nodes.  Carotids 2+ bilaterally.  HEART: Regular in rate and rhythm, without murmurs, gallops, or rubs.  LUNGS: Clear to auscultation bilaterally.  ABDOMEN: Protuberant, soft, nontender. Bowel sounds present. No masses  or palpable organomegaly.  EXTREMITIES: Without pretibial or pedal edema. Dorsalis pedis pulses  present bilaterally. No cyanosis in her feet or toes.  SKIN: Without rashes or lesions.  NEUROLOGIC: Cranial nerves 2 through 12 intact. Motor exam nonfocal.    LABORATORY DATA: White count 10.5, hemoglobin 10.8, hematocrit 33.6,  platelets 159. PT 13.4, INR 1.3, PTT 30. Sodium 140, potassium 3.3,  chloride 101, bicarbonate 27, glucose 86, BUN 12, creatinine 1.0. Albumin  3.3, total bilirubin 1.9, alkaline phosphatase 247, AST 52, ALT 35.  Ammonia 50. Urinalysis was clear. Lactic acid 1.2. Pregnancy test was  negative.     Chest x-ray was clear.    Abdominal film: Nonspecific bowel gas pattern. Abdominal ultrasound  showed gallbladder sludge, dilated common bile duct. Hepatosplenomegaly.    ASSESSMENT AND PLAN  1. Right upper quadrant pain. Perhaps this is related to sludge in the  common bile duct. The patient's liver enzymes at this time are stable  compared to values from November 07, 2008, when the total bilirubin was 3.0,  AST 36, and the ALT 28. The alkaline phosphatase was 223. The patient had  received Dilaudid in the emergency department, and that will continue to be  available. The patient will have nothing by mouth until she has been seen  by [name].  2. Cirrhosis. The patient will continue on her usual lactulose, Bumex,  and spironolactone.  3. Hypokalemia. This is actually improved compared to a value of 3.1 last  week. While the patient is n.p.o., she will be on IV fluid with 20 mEq of  potassium/L. Once that is discontinued, oral potassium supplementation  will be needed.  4. Abnormal PT and INR. If patient were to undergo an ERCP, she might  need fresh frozen plasma first.    Total time here was 50 minutes.          E-Signed By  Shireen Quan, M.D. 11/20/2008 20:10    Shireen Quan, M.D.    cc: Mingo Amber   [name], MD   Shireen Quan, M.D.      RT/WMX; D: 11/20/2008 5:46 A; T: 11/20/2008 6:29 A; DOC# [id number]; Job#  [id number]

## 2008-11-20 NOTE — Consults (Signed)
Name: Breanna Lester, Breanna Lester Admitted: 11/19/2008  MR #: [id number] DOB: Jun 04, 1961  Account #: [id number] Age: 48  Consultant: Greig Castilla L. Aundria Rud, M.D. Location:     CONSULTATION REPORT    DATE OF CONSULTATION: 11/20/2008  REFERRING PHYSICIAN:      DIAGNOSIS: Asymptomatic gallbladder sludge.    HISTORY OF PRESENT ILLNESS: A 48 year old, white female whose main issue  is chronic liver failure. She has been seen by Gastroenterology for over a  year at [facility]. She actually had an appointment with them today, but did not  make it. The patient actually underwent a hysterectomy in November 2008.  She came back to the hospital in January 2009, admitted with pneumonia at  that time. She was seen and diagnosed with chronic liver failure and  referred to GI at [facility]. She subsequently has undergone needle biopsy and  has idiopathic diagnosis of cirrhosis, unknown cause. No history of  alcohol abuse or usage. No history of hepatitis. It has been noted in her  workup that she has gallbladder sludge, but she has no history of fatty  food intolerance, no postprandial pain, no nausea or vomiting, and no  bloating. It should be noted also in 2009, that summer, she evidently went  into a coma for 3 weeks, was on a ventilator. She is not sure why she was  in a coma. She said it may have been a reaction to blood transfusion. In  any case, she has been seen on and off by other surgeons at [facility] and here,  and they have all seen her for her sludge. They told her they would leave  it alone and would not touch her with a 10-foot pole.    She comes in the ER now complaining of chills, generalized aches and pains.  No nausea or vomiting.    ALLERGIES: TYLENOL AND LATEX.    MEDICATIONS: She takes  1. Lactulose 10 mg twice a day.  2. Sertraline 100 mg a day.  3. Bumex 2 mg a day.  4. Spironolactone 200 mg a day.    TOBACCO/ETOH: She does not smoke, does not drink.    PAST MEDICAL HISTORY: Hysterectomy, C-section, liver disease, congestive   failure, pneumonia, coma.    SOCIAL HISTORY: Married.    FAMILY HISTORY: Noncontributory.    REVIEW OF SYSTEMS: Documented.    PHYSICAL EXAMINATION  GENERAL: An alert, oriented 48 year old, white female, very anxious,  nervous, in no acute distress.  VITAL SIGNS: She is afebrile. Blood pressure is 130/49, pulse is 79, O2  saturations are 98% on room air.  HEENT: PERRLA, EOMI. Ears, nose and throat clear. She has what looks  like mild icterus.  NECK: Supple. No JVD. No lymphadenopathy.  CHEST: Clear breath sounds.  CARDIOVASCULAR: Regular rate and rhythm. No murmurs, rubs or gallops.  ABDOMEN: Soft. She has hepatomegaly at 3 fingerbreadths below the costal  margin. It is tender, but there is no guarding, no rebound, no Murphy's,  and no ascites. She has an umbilical hernia; it is reducible, nontender.  Active bowel sounds.  EXTREMITIES: No edema. No calf tenderness.    LABORATORY STUDIES: The only lab back is an ammonia level at 51, elevated.      Gallbladder ultrasound showed gallbladder sludge, but normal wall  thickness. No pericholecystic fluid. Common duct was 8 mm.    CT scan showed hepatosplenomegaly, no inflammatory processes, and sludge in  the gallbladder.    IMPRESSION: She has asymptomatic gallbladder sludge. She has chronic  hepatic  failure, workup in progress.    At this point, she has an asymptomatic gallbladder. The gallbladder does  not need to be removed at this point. Furthermore, with her liver failure,  I think surgeons would be very reluctant to operate on an asymptomatic  patient, especially one in her condition with her liver comorbidities.    PLAN: At this point, we will follow her on an as-needed basis. I have  discussed the case with Dr. Robley Fries and he agrees.      E-Signed By  Anne Ng. Aundria Rud, M.D. 11/20/2008 11:36    Anne Ng. Aundria Rud, M.D.    cc: Ernest Mallick, M.D.   Anne Ng. Aundria Rud, M.D.        Jane Canary; D: 11/20/2008 4:01 A; T: 11/20/2008 5:36 A; DOC# [id number]; Job#  [id number]

## 2008-11-20 NOTE — Consults (Signed)
Name: Breanna Lester, Breanna Lester Admitted: 11/20/2008  MR #: [id number] DOB: 12-23-1960  Account #: [id number] Age: 48  Consultant: [name], MD Location: [id number]     CONSULTATION REPORT    DATE OF CONSULTATION:  REFERRING PHYSICIAN:      REFERRING PHYSICIAN: Alessandra Bevels. Emelda Fear, MD    CHIEF COMPLAINT: I am asked to see by Dr. Virl Son, MD and Dr. De Blanch, regarding right upper quadrant pain.    HISTORY OF PRESENT ILLNESS: This history is obtained from the patient, it  is supplemented from the medical record. This is one of multiple [facility] admissions this this patient. Her history dates to  the fall of 2008 when she was found to have elevated liver tests. She was  admitted to [facility] with abnormal liver tests, jaundice, abdominal pain.  She has had an extensive liver evaluation both here and at [facility].    She is currently in the process of going through a completed liver  evaluation which could lead to her listing for transplant. The next step  would be a week-long series of tests and then discussion and presentation  to the transplant selection committed is my understanding.    She has been sleeping a lot lately, up to 20 hours per day. She is having  intermittent right upper quadrant pain. The pain radiates straight to the  back. It is 8 out of 10 severity. It is improved only by pain medicine.  It is not changed by food. She was found to have a dilated common bile  duct on imaging study.    PAST MEDICAL SURGICAL HISTORY:  1. Severe chronic liver disease with cryptogenic cirrhosis, ascites and  anasarca.  2. Prior episode of respiratory failure, that followed transfusion  (?TROLLE).  3. Ascites.  4. Hepatic encephalopathy.  5. Hysterectomy.  6. C-section.  7. Congestive heart failure?.  8. Pneumonia.  9. Gastroesophageal reflux.    FAMILY HISTORY: Positive for Crohn disease in her sister, breast and  uterine cancer in her mother.     SOCIAL HISTORY: She lives with her husband. She is married. She smokes a  little but does not use alcohol.    ALLERGIES: None known.    MEDICATIONS CURRENTLY:  1. Lactulose.  2. Sertraline.  3. Bumex.  4. Spironolactone.    REVIEW OF SYSTEMS: CONSTITUTIONAL: Some sweats. EYES: Negative. ENMT:  Some sinus problems. RESPIRATORY: Fatigue and shortness of breath.  CARDIAC: Negative. GASTROINTESTINAL: See above. NEUROLOGIC: Some  encephalopathy in the past. PSYCHIATRIC: Depression. GENITOURINARY:  Negative. HEMATOLOGIC/LYMPHATIC: History of anemia and of  thrombocytopenia.    PHYSICAL EXAMINATION:  VITAL SIGNS: Temperature 98.3 degrees Fahrenheit, blood pressure 133/76,  pulse 72, respirations 20.  GENERAL: Appears older than stated age.  EYES: Jaundice is present. Muddy sclerae. No icterus.  ENMT: Lips, teeth, tongues, oropharynx unremarkable.  NECK: Without adenopathy.  SKIN: Palmar erythema and spider angiomas are present  ABDOMEN: There is tenderness in the right upper quadrant. It increases  when she takes a deep breath with positive Murphy sign. No definite  hepatosplenomegaly or mass.  LYMPHATIC: No anterior, posterior, cervical, supraclavicular, or inguinal  lymphadenopathy.  EXTREMITIES: 2+ edema in pretibial area.    LABORATORY: White blood cell count normal. Hemoglobin normal. INR 1.3.  Sodium 140, potassium 3.3, chloride 101, CO2 27. Albumin 3.3, total  bilirubin 1.9, alkaline phosphatase 247, SGOT 52.    Ultrasound of the liver and gallbladder shows gallbladder sludge, dilated  common bile duct measuring 8 mm.    IMPRESSION REPORT/PLAN:  1. Right upper quadrant pain.  2. Gallbladder sludge.  3. Dilated common bile duct.  4. She is stable, although she is having pain intermittently, she does not  have clinical evidence of cholangitis.    RECOMMENDATIONS:  1. MRI/MRCP.  2. No plans for ERCP today.  3. I thank Dr. Cory Roughen and colleagues for seeing her in surgical   consultation. I agree there was not an urgent need for cholecystectomy.  We may consider cholecystectomy after the results of the HIDA scan.  4. HIDA scan.  5. I have discussed this with the patient.  6. I appreciate Dr. Emelda Fear and Dr. Jamison Neighbor allowing me to see this  interesting patient.        Reviewed on 11/20/2008 2:55 PM      E-Signed By  [name], MD 12/07/2008 17:53    [name], MD    cc: Benetta Spar, M.D.   [name], MD    DR. Liam Rogers, [facility]    RFK/WMX; D: 11/20/2008 1:49 P; T: 11/20/2008 2:39 P; DOC# [id number]; Job#  [id number]

## 2008-11-21 LAB — METABOLIC PANEL, COMPREHENSIVE
A-G Ratio: 0.7 — ABNORMAL LOW (ref 1.1–2.2)
ALT (SGPT): 36 U/L (ref 12–78)
AST (SGOT): 44 U/L — ABNORMAL HIGH (ref 15–37)
Albumin: 3 g/dL — ABNORMAL LOW (ref 3.5–5.0)
Alk. phosphatase: 207 U/L — ABNORMAL HIGH (ref 50–136)
Anion gap: 8 mmol/L (ref 5–15)
BUN/Creatinine ratio: 11 — ABNORMAL LOW (ref 12–20)
BUN: 11 MG/DL (ref 6–20)
Bilirubin, total: 1.8 MG/DL — ABNORMAL HIGH (ref 0.2–1.0)
CO2: 28 MMOL/L (ref 21–32)
Calcium: 8.8 MG/DL (ref 8.5–10.1)
Chloride: 103 MMOL/L (ref 97–108)
Creatinine: 1 MG/DL (ref 0.6–1.3)
GFR est AA: >60 mL/min/{1.73_m2} (ref >60)
GFR est non-AA: >60 mL/min/{1.73_m2} (ref >60)
Globulin: 4.1 g/dL — ABNORMAL HIGH (ref 2.0–4.0)
Glucose: 140 MG/DL — ABNORMAL HIGH (ref 65–100)
Potassium: 3.4 MMOL/L — ABNORMAL LOW (ref 3.5–5.1)
Protein, total: 7.1 g/dL (ref 6.4–8.2)
Sodium: 139 MMOL/L (ref 136–145)

## 2008-11-21 LAB — CBC W/O DIFF
HCT: 31.9 % — ABNORMAL LOW (ref 35.0–47.0)
HGB: 10.1 g/dL — ABNORMAL LOW (ref 11.5–16.0)
MCH: 27 PG (ref 26.0–34.0)
MCHC: 31.7 g/dL (ref 30.0–36.5)
MCV: 85.3 FL (ref 80.0–99.0)
PLATELET: 131 10*3/uL — ABNORMAL LOW (ref 150–400)
RBC: 3.74 M/uL — ABNORMAL LOW (ref 3.80–5.20)
RDW: 15.8 % — ABNORMAL HIGH (ref 11.5–14.5)
WBC: 9.5 10*3/uL (ref 3.6–11.0)

## 2008-11-23 LAB — HEPATIC FUNCTION PANEL
A-G Ratio: 0.8 — ABNORMAL LOW (ref 1.1–2.2)
ALT (SGPT): 39 U/L (ref 12–78)
AST (SGOT): 49 U/L — ABNORMAL HIGH (ref 15–37)
Albumin: 3.5 g/dL (ref 3.5–5.0)
Alk. phosphatase: 249 U/L — ABNORMAL HIGH (ref 50–136)
Bilirubin, direct: 0.7 MG/DL — ABNORMAL HIGH (ref 0.0–0.2)
Bilirubin, total: 1.9 MG/DL — ABNORMAL HIGH (ref 0.2–1.0)
Globulin: 4.4 g/dL — ABNORMAL HIGH (ref 2.0–4.0)
Protein, total: 7.9 g/dL (ref 6.4–8.2)

## 2008-11-23 LAB — METABOLIC PANEL, BASIC
Anion gap: 8 mmol/L (ref 5–15)
BUN/Creatinine ratio: 15 (ref 12–20)
BUN: 16 MG/DL (ref 6–20)
CO2: 27 MMOL/L (ref 21–32)
Calcium: 9.1 MG/DL (ref 8.5–10.1)
Chloride: 102 MMOL/L (ref 97–108)
Creatinine: 1.1 MG/DL (ref 0.6–1.3)
GFR est AA: >60 mL/min/{1.73_m2} (ref >60)
GFR est non-AA: 57 mL/min/{1.73_m2} — ABNORMAL LOW (ref >60)
Glucose: 165 MG/DL — ABNORMAL HIGH (ref 65–100)
Potassium: 4.4 MMOL/L (ref 3.5–5.1)
Sodium: 137 MMOL/L (ref 136–145)

## 2008-11-23 LAB — CBC W/O DIFF
HCT: 33.9 % — ABNORMAL LOW (ref 35.0–47.0)
HGB: 11 g/dL — ABNORMAL LOW (ref 11.5–16.0)
MCH: 27.3 PG (ref 26.0–34.0)
MCHC: 32.4 g/dL (ref 30.0–36.5)
MCV: 84.1 FL (ref 80.0–99.0)
PLATELET: 149 10*3/uL — ABNORMAL LOW (ref 150–400)
RBC: 4.03 M/uL (ref 3.80–5.20)
RDW: 15.7 % — ABNORMAL HIGH (ref 11.5–14.5)
WBC: 9 10*3/uL (ref 3.6–11.0)

## 2008-11-24 LAB — CULTURE, BLOOD, PAIRED
Culture result:: NO GROWTH
Report Status: 4062010

## 2008-11-24 NOTE — Discharge Summary (Addendum)
Name: Breanna Lester, Breanna Lester Admitted: 11/20/2008  MR #: [id number] Discharged: 11/23/2008  Account #: [id number] DOB: [birth year]  Physician: Chauncey Reading, MD Age 48     DISCHARGE SUMMARY      Transferred to [facility].    DISCHARGE DIAGNOSES:  1. Right upper quadrant pain secondary to cholecystitis.  2. Cryptogenic cirrhosis.  3. History of ascites and hepatic encephalopathy secondary to cirrhosis.  4. Hysterectomy.  5. Cesarean section.  6. Heart failure.    CONSULTATIONS:  1. Anne Ng. Aundria Rud, MD  2. [name], MD    PROCEDURES:  1. Biliary HIDA scan 11/20/2008 which shows biliary dyskinesia.  2. CT scan 11/20/2008:   A. No bowel obstruction, ileus, or perforation.   B. Distended gallbladder, gallbladder sludge, or gallstones.   C. Mild splenomegaly.  3. Ultrasound abdomen 11/19/2008:   A. Gallbladder sludge, dilated common bile duct. Recommend correlation  with LFTs and ERCP, MRCP.   B. Hepatosplenomegaly.    HISTORY OF PRESENT ILLNESS/HOSPITAL COURSE: The patient is a 48 year old  Caucasian female with a past medical history as stated above. She presented  to the hospital having worsening right upper quadrant pain radiating to her  back. She does have a history of cryptogenic cirrhosis diagnosed in 2009.  She describes the pain initially as being 8/10 in intensity. In the ER, she  had received pain medications as well as antiemetics, fluid, and 1 dose of  Zosyn. She did have an abdominal ultrasound with the results as stated  above. She was admitted to a medical bed, where she had a GI consult as  well as surgical consult. It was felt that the patient would best benefit  from having her gallbladder taken out. This was in discussion with Dr.  Boyce Medici, who felt her gallbladder should be taken in a transplant center  secondary to her history of cryptogenic cirrhosis. She was transferred  today to [facility] in stable condition.    DISCHARGE MEDICATIONS:  1. Bumex 2 mg daily.  2. Lactulose 15 mL b.i.d.   3. KCl 40 mEq daily.  4. Zoloft 100 mg a.m.  5. Aldactone 200 mg a.m.  6. Zofran 4 mg IV every 6 hours as needed for nausea.  7. Percocet 5/325 every 4 hours as needed for pain.        E-Signed By  Chauncey Reading, MD 01/15/2009 18:25    Chauncey Reading, MD    cc: Chauncey Reading, MD    DR. STRAVITZ, HEAD OF HEPATOLOGY, [facility] [place] [facility]    MSC/WMX; D: 11/23/2008 2:50 P; T: 11/24/2008 8:21 A; DOC# [id number]; Job#  [id number]

## 2008-12-08 NOTE — Progress Notes (Signed)
Reviewed and received consultation from [facility].

## 2009-01-25 MED ORDER — BUMETANIDE 1 MG TAB
1 mg | ORAL | Status: DC
Start: 2009-01-25 — End: 2009-05-26

## 2009-01-29 NOTE — Telephone Encounter (Signed)
No answer.[phone].  NO ANS. AT OTHER PHONE LINE.10:20 A.M

## 2009-02-04 NOTE — Telephone Encounter (Signed)
Tried again to return pt call, left message ans. Machine.

## 2009-03-26 MED ORDER — SERTRALINE 100 MG TAB
100 mg | ORAL_TABLET | ORAL | Status: AC
Start: 2009-03-26 — End: ?

## 2009-03-26 MED ORDER — FUROSEMIDE 80 MG TAB
80 mg | ORAL_TABLET | ORAL | Status: DC
Start: 2009-03-26 — End: 2009-05-26

## 2009-04-22 MED ORDER — FUROSEMIDE 80 MG TAB
80 mg | ORAL_TABLET | ORAL | Status: DC
Start: 2009-04-22 — End: 2009-05-26

## 2009-04-22 MED ORDER — SERTRALINE 100 MG TAB
100 mg | ORAL_TABLET | ORAL | Status: DC
Start: 2009-04-22 — End: 2009-04-29

## 2009-04-29 DIAGNOSIS — K7682 Hepatic encephalopathy: Secondary | ICD-10-CM

## 2009-04-29 NOTE — ED Notes (Signed)
 Formatting of this note might be different from the original.  Critical potassium value of 2.2 called from lab - Dr. Gladis made aware  Electronically signed by Gladis Norris A at 04/29/2009 11:46 PM EDT

## 2009-04-29 NOTE — ED Provider Notes (Signed)
 Formatting of this note is different from the original.  HPI Comments: This is a 48 yo F with a hx of cirrohsis presents ambulatory to ED with cc of vomiting. Pt's relative reports nausea and vomiting all day.  Per family member, when pt experiences N/V secondary to cirrhosis, it usually is associated with a fever. Pt's relative reports that pt fractured her ulna and radius 2 days ago after falling and that fractures were never set, just wrapped. Pt's relative also notes that pt's vision has been worsening, indicating this as the reason she fell 2 days ago.    PCP: BELYNDA COCKING, MD  PMHx: cirrohsis    There are no other complaints, changes or physical findings. 12:02 AM    The history is provided by the patient and a relative.       Past Medical History   Diagnosis Date   ? Liver disease 2009     liver failure   ? Depression    ? Anxiety          Past Surgical History   Procedure Date   ? Hx tonsillectomy 1972   ? Hx adenoidectomy 1972   ? Hx cesarean section 1986 and 1993   ? Hx hysterectomy 2008   ? Hx dilation and curettage 1999         Family History   Problem Relation Age of Onset   ? Cancer Mother    ? Heart Disease Father          History   Social History   ? Marital Status: Married     Spouse Name: N/A     Number of Children: N/A   ? Years of Education: N/A   Occupational History   ? Not on file.   Social History Main Topics   ? Tobacco Use: Quit     Quit date: 02/28/2008   ? Alcohol Use: No   ? Drug Use:    ? Sexually Active:    Other Topics Concern   ? Not on file   Social History Narrative   ? No narrative on file     ALLERGIES: Review of patient's allergies indicates no known allergies.    Review of Systems   Constitutional: Positive for fever.   HENT: Negative.    Eyes:        Worsening vision. SEE HPI.   Respiratory: Negative.    Cardiovascular: Negative.    Gastrointestinal: Positive for nausea and vomiting.   Genitourinary: Negative.    Musculoskeletal: Positive for arthralgias (L arm).         See HPI   Neurological: Negative.    All other systems reviewed and are negative.    Filed Vitals:    04/29/2009 10:29 PM   BP: 110/57   Pulse: 105   Temp: 98.6 F (37 C)   Resp: 22   Height: 5' 2 (1.575 m)   Weight: 162 lb 11.2 oz (73.8 kg)   SpO2: 95%         Physical Exam   Nursing note and vitals reviewed.  Constitutional: She is oriented to person, place, and time. She appears well-developed and well-nourished. She appears distressed.   Eyes: Conjunctivae and extraocular motions are normal. Pupils are equal, round, and reactive to light. Right eye exhibits no discharge. Left eye exhibits no discharge. No scleral icterus.   Neck: Normal range of motion. Neck supple. No JVD present. No tracheal deviation present.   Cardiovascular: Normal rate, regular rhythm  and normal heart sounds.  Exam reveals no gallop and no friction rub.    No murmur heard.  Pulmonary/Chest: Effort normal and breath sounds normal. No respiratory distress. She has no wheezes. She has no rales. She exhibits no tenderness.   Abdominal: Soft. Bowel sounds are normal. She exhibits no distension. Generalized and tenderness is present. She has no rebound and no guarding.   Musculoskeletal: Normal range of motion. She exhibits no edema and no tenderness.        LUE in splint.   Neurological: She is alert and oriented to person, place, and time. No cranial nerve deficit.   Skin: Skin is warm. She is diaphoretic.   Psychiatric: Thought content normal. Her mood appears anxious. Her speech is delayed. She is agitated.       Coding    Procedures    CONSULT NOTE:   12:37 AM  Gladis Lenis, MD spoke with Dr. Vicci,   Specialty: Hospitalist  Discussed pt's hx, disposition, and available diagnostic and imaging results. Reviewed care plans. Consulting physician agrees with plans as outlined.  Will admit pt.   Written by Tinnie Limbo, ED Scribe, as dictated by Dr. Gladis.    Electronically signed by Gladis Lenis FALCON at 04/30/2009  5:19 AM EDT

## 2009-04-29 NOTE — ED Notes (Signed)
Critical potassium value of 2.2 called from lab - Dr. Daphine Deutscher made aware

## 2009-04-29 NOTE — ED Provider Notes (Signed)
HPI Comments: This is a 48 yo F with a hx of cirrohsis presents ambulatory to ED with cc of vomiting. Pt's relative reports nausea and vomiting "all day".  Per family member, when pt experiences N/V secondary to cirrhosis, it usually is associated with a fever. Pt's relative reports that pt fractured her ulna and radius 2 days ago after falling and that fractures were never "set", just "wrapped". Pt's relative also notes that pt's vision has been worsening, indicating this as the reason she fell 2 days ago.    PCP: Charlies Constable, MD  PMHx: cirrohsis    There are no other complaints, changes or physical findings. 12:02 AM        The history is provided by the patient and a relative.        Past Medical History   Diagnosis Date   ??? Liver disease 2009     liver failure   ??? Depression    ??? Anxiety           Past Surgical History   Procedure Date   ??? Hx tonsillectomy 1972   ??? Hx adenoidectomy 1972   ??? Hx cesarean section 1986 and 1993   ??? Hx hysterectomy 2008   ??? Hx dilation and curettage 1999           Family History   Problem Relation Age of Onset   ??? Cancer Mother    ??? Heart Disease Father           History   Social History   ??? Marital Status: Married     Spouse Name: N/A     Number of Children: N/A   ??? Years of Education: N/A   Occupational History   ??? Not on file.   Social History Main Topics   ??? Tobacco Use: Quit     Quit date: 02/28/2008   ??? Alcohol Use: No   ??? Drug Use:    ??? Sexually Active:    Other Topics Concern   ??? Not on file   Social History Narrative   ??? No narrative on file           ALLERGIES: Review of patient's allergies indicates no known allergies.      Review of Systems   Constitutional: Positive for fever.   HENT: Negative.    Eyes:        Worsening vision. SEE HPI.   Respiratory: Negative.    Cardiovascular: Negative.    Gastrointestinal: Positive for nausea and vomiting.   Genitourinary: Negative.    Musculoskeletal: Positive for arthralgias (L arm).        See HPI   Neurological: Negative.     All other systems reviewed and are negative.        Filed Vitals:    04/29/2009 10:29 PM   BP: 110/57   Pulse: 105   Temp: 98.6 ??F (37 ??C)   Resp: 22   Height: 5\' 2"  (1.575 m)   Weight: 162 lb 11.2 oz (73.8 kg)   SpO2: 95%              Physical Exam   Nursing note and vitals reviewed.  Constitutional: She is oriented to person, place, and time. She appears well-developed and well-nourished. She appears distressed.   Eyes: Conjunctivae and extraocular motions are normal. Pupils are equal, round, and reactive to light. Right eye exhibits no discharge. Left eye exhibits no discharge. No scleral icterus.   Neck: Normal range of motion.  Neck supple. No JVD present. No tracheal deviation present.   Cardiovascular: Normal rate, regular rhythm and normal heart sounds.  Exam reveals no gallop and no friction rub.    No murmur heard.  Pulmonary/Chest: Effort normal and breath sounds normal. No respiratory distress. She has no wheezes. She has no rales. She exhibits no tenderness.   Abdominal: Soft. Bowel sounds are normal. She exhibits no distension. Generalized and tenderness is present. She has no rebound and no guarding.   Musculoskeletal: Normal range of motion. She exhibits no edema and no tenderness.        LUE in splint.   Neurological: She is alert and oriented to person, place, and time. No cranial nerve deficit.   Skin: Skin is warm. She is diaphoretic.   Psychiatric: Thought content normal. Her mood appears anxious. Her speech is delayed. She is agitated.        Coding    Procedures    CONSULT NOTE:   12:37 AM  Elliot Dally, MD spoke with Dr. Laural Benes,   Specialty: Hospitalist  Discussed pt's hx, disposition, and available diagnostic and imaging results. Reviewed care plans. Consulting physician agrees with plans as outlined.  Will admit pt.   Written by Warnell Forester, ED Scribe, as dictated by Dr. Daphine Deutscher.

## 2009-04-30 ENCOUNTER — Inpatient Hospital Stay
Admit: 2009-04-30 | Discharge: 2009-05-26 | Disposition: A | Discharge status: Rehabilitation Facility | Payer: Self-pay | Attending: Internal Medicine | Admitting: Internal Medicine

## 2009-04-30 LAB — METABOLIC PANEL, COMPREHENSIVE
A-G Ratio: 0.9 — ABNORMAL LOW (ref 1.1–2.2)
ALT (SGPT): 30 U/L (ref 12–78)
AST (SGOT): 40 U/L — ABNORMAL HIGH (ref 15–37)
Albumin: 3.3 g/dL — ABNORMAL LOW (ref 3.5–5.0)
Alk. phosphatase: 218 U/L — ABNORMAL HIGH (ref 50–136)
Anion gap: 6 mmol/L (ref 5–15)
BUN/Creatinine ratio: 13 (ref 12–20)
BUN: 13 MG/DL (ref 6–20)
Bilirubin, total: 3.3 MG/DL — ABNORMAL HIGH (ref 0.2–1.0)
CO2: 31 MMOL/L (ref 21–32)
Calcium: 8.8 MG/DL (ref 8.5–10.1)
Chloride: 97 MMOL/L (ref 97–108)
Creatinine: 1 MG/DL (ref 0.6–1.3)
GFR est AA: >60 mL/min/{1.73_m2} (ref >60)
GFR est non-AA: >60 mL/min/{1.73_m2} (ref >60)
Globulin: 3.7 g/dL (ref 2.0–4.0)
Glucose: 92 MG/DL (ref 65–100)
Potassium: 2.2 MMOL/L — CL (ref 3.5–5.1)
Protein, total: 7 g/dL (ref 6.4–8.2)
Sodium: 134 MMOL/L — ABNORMAL LOW (ref 136–145)

## 2009-04-30 LAB — CBC WITH AUTOMATED DIFF
ABS. BASOPHILS: 0.1 10*3/uL (ref 0.0–0.1)
ABS. EOSINOPHILS: 0.2 10*3/uL (ref 0.0–0.4)
ABS. LYMPHOCYTES: 1.6 10*3/uL (ref 0.8–3.5)
ABS. MONOCYTES: 0.8 10*3/uL (ref 0.0–1.0)
ABS. NEUTROPHILS: 5.2 10*3/uL (ref 1.8–8.0)
BASOPHILS: 1 % (ref 0–1)
EOSINOPHILS: 3 % (ref 0–7)
HCT: 30.9 % — ABNORMAL LOW (ref 35.0–47.0)
HGB: 10.1 g/dL — ABNORMAL LOW (ref 11.5–16.0)
LYMPHOCYTES: 21 % (ref 12–49)
MCH: 26.5 PG (ref 26.0–34.0)
MCHC: 32.7 g/dL (ref 30.0–36.5)
MCV: 81.1 FL (ref 80.0–99.0)
MONOCYTES: 11 % (ref 5–13)
NEUTROPHILS: 64 % (ref 32–75)
PLATELET: 85 10*3/uL — ABNORMAL LOW (ref 150–400)
RBC: 3.81 M/uL (ref 3.80–5.20)
RDW: 15.5 % — ABNORMAL HIGH (ref 11.5–14.5)
WBC: 7.9 10*3/uL (ref 3.6–11.0)

## 2009-04-30 LAB — URINALYSIS W/ REFLEX CULTURE
Bacteria: NEGATIVE /HPF
Bilirubin: NEGATIVE
Blood: NEGATIVE
Glucose: NEGATIVE MG/DL
Ketone: NEGATIVE MG/DL
Nitrites: NEGATIVE
Protein: NEGATIVE MG/DL
Specific gravity: 1.011 (ref 1.003–1.030)
Urobilinogen: 2 EU/DL — ABNORMAL HIGH (ref 0.2–1.0)
pH (UA): 6 (ref 5.0–8.0)

## 2009-04-30 LAB — MAGNESIUM: Magnesium: 1.4 MG/DL — ABNORMAL LOW (ref 1.6–2.4)

## 2009-04-30 LAB — AMMONIA: Ammonia, plasma: 52 umol/L — ABNORMAL HIGH (ref <32)

## 2009-04-30 LAB — TROPONIN I: Troponin-I, Qt.: 0.06 ng/mL — ABNORMAL HIGH (ref <0.05)

## 2009-04-30 LAB — PROTHROMBIN TIME + INR
INR: 1.5 — ABNORMAL HIGH (ref 0.9–1.1)
Prothrombin time: 14.5 SECS — ABNORMAL HIGH (ref 9.0–11.0)

## 2009-04-30 LAB — ETHYL ALCOHOL: ALCOHOL(ETHYL),SERUM: <10 MG/DL (ref <10)

## 2009-04-30 LAB — CK W/ CKMB & INDEX
CK - MB: 5.1 NG/ML — ABNORMAL HIGH (ref 0.5–3.6)
CK-MB Index: 4.1
CK: 125 U/L (ref 21–215)

## 2009-04-30 MED ADMIN — diazepam (VALIUM) 5 mg/mL injection: @ 08:00:00 | NDC 00409127332

## 2009-04-30 MED ADMIN — pantoprazole (PROTONIX) tablet 40 mg: ORAL | @ 15:00:00 | NDC 00008084181

## 2009-04-30 MED ADMIN — saline peripheral flush 5 mL: @ 05:00:00 | NDC 87701099893

## 2009-04-30 MED ADMIN — sodium chloride 0.9 % bolus infusion 1,000 mL: INTRAVENOUS | @ 04:00:00 | NDC 00409798309

## 2009-04-30 MED ADMIN — lorazepam (ATIVAN) injection 0.26 mg: INTRAVENOUS | @ 11:00:00 | NDC 10019010239

## 2009-04-30 MED ADMIN — potassium chloride 10 mEq in 100 ml IVPB: INTRAVENOUS | @ 17:00:00 | NDC 00338070948

## 2009-04-30 MED ADMIN — sertraline (ZOLOFT) tablet 100 mg: ORAL | @ 15:00:00 | NDC 59762490003

## 2009-04-30 MED ADMIN — lactulose (CHRONULAC) solution 10 g: ORAL | @ 15:00:00 | NDC 00121457730

## 2009-04-30 MED ADMIN — oxycodone (ROXICODONE) tablet 10 mg: ORAL | NDC 00406055262

## 2009-04-30 MED ADMIN — lactulose (CHRONULAC) solution 10 g: ORAL | NDC 00121457730

## 2009-04-30 MED ADMIN — lorazepam (ATIVAN) tablet 1 mg: ORAL | @ 20:00:00 | NDC 63739015410

## 2009-04-30 MED ADMIN — ondansetron (ZOFRAN) injection 4 mg: INTRAVENOUS | @ 04:00:00 | NDC 00143989105

## 2009-04-30 MED ADMIN — SALINE PERIPHERAL FLUSH Q8H 5 mL: @ 10:00:00 | NDC 58016499501

## 2009-04-30 MED ADMIN — potassium chloride 10 mEq in 100 ml IVPB: INTRAVENOUS | @ 08:00:00 | NDC 00338070948

## 2009-04-30 MED ADMIN — magnesium sulfate 2 g/50 ml IVPB: INTRAVENOUS | @ 15:00:00 | NDC 00409672924

## 2009-04-30 MED ADMIN — morphine injection 4 mg: INTRAVENOUS | @ 05:00:00 | NDC 00409176230

## 2009-04-30 MED ADMIN — potassium chloride SR (KLOR-CON 10) tablet 40 mEq: ORAL | NDC 00074780419

## 2009-04-30 MED ADMIN — spironolactone (ALDACTONE) tablet 50 mg: ORAL | @ 15:00:00 | NDC 63739022610

## 2009-04-30 MED ADMIN — SALINE PERIPHERAL FLUSH Q8H 5 mL: @ 18:00:00 | NDC 58016499501

## 2009-04-30 MED ADMIN — SALINE PERIPHERAL FLUSH Q8H 5 mL: @ 04:00:00 | NDC 87701099893

## 2009-04-30 MED ADMIN — potassium chloride 10 mEq in 100 ml IVPB: INTRAVENOUS | @ 21:00:00 | NDC 00338070948

## 2009-04-30 MED ADMIN — potassium chloride 10 mEq in 100 ml IVPB: INTRAVENOUS | @ 15:00:00 | NDC 00338070948

## 2009-04-30 MED ADMIN — potassium chloride 10 mEq in 100 ml IVPB: INTRAVENOUS | @ 10:00:00 | NDC 00338070948

## 2009-04-30 MED ADMIN — potassium chloride 10 mEq in 100 ml IVPB: INTRAVENOUS | @ 06:00:00 | NDC 00338070948

## 2009-04-30 MED FILL — POTASSIUM CHLORIDE 10 MEQ/100 ML IV PIGGY BACK: 10 mEq/0 mL | INTRAVENOUS | Qty: 200

## 2009-04-30 MED FILL — LORAZEPAM 0.5 MG TAB: 0.5 mg | ORAL | Qty: 2

## 2009-04-30 MED FILL — POTASSIUM CHLORIDE 10 MEQ/100 ML IV PIGGY BACK: 10 mEq/0 mL | INTRAVENOUS | Qty: 100

## 2009-04-30 MED FILL — ONDANSETRON (PF) 4 MG/2 ML INJECTION: 4 mg/2 mL | INTRAMUSCULAR | Qty: 2

## 2009-04-30 MED FILL — LACTULOSE 10 GRAM/15 ML ORAL SOLN: 10 gram/15 mL | ORAL | Qty: 30

## 2009-04-30 MED FILL — OXYCODONE 5 MG TAB: 5 mg | ORAL | Qty: 2

## 2009-04-30 MED FILL — MAGNESIUM SULFATE 2 GRAM/50 ML IVPB: 2 gram/50 mL (4 %) | INTRAVENOUS | Qty: 50

## 2009-04-30 MED FILL — SALINE FLUSH INJECTION SYRINGE: INTRAMUSCULAR | Qty: 10

## 2009-04-30 MED FILL — OXYCODONE 5 MG CAP: 5 mg | ORAL | Qty: 2

## 2009-04-30 MED FILL — SPIRONOLACTONE 25 MG TAB: 25 mg | ORAL | Qty: 2

## 2009-04-30 MED FILL — SERTRALINE 50 MG TAB: 50 mg | ORAL | Qty: 2

## 2009-04-30 MED FILL — K-TAB 10 MEQ TABLET,EXTENDED RELEASE: 10 mEq | ORAL | Qty: 4

## 2009-04-30 MED FILL — DIAZEPAM 5 MG/ML SYRINGE: 5 mg/mL | INTRAMUSCULAR | Qty: 2

## 2009-04-30 MED FILL — SODIUM CHLORIDE 0.9 % IV: INTRAVENOUS | Qty: 1000

## 2009-04-30 MED FILL — LORAZEPAM 2 MG/ML IJ SOLN: 2 mg/mL | INTRAMUSCULAR | Qty: 1

## 2009-04-30 MED FILL — PROTONIX 40 MG TABLET,DELAYED RELEASE: 40 mg | ORAL | Qty: 1

## 2009-04-30 MED FILL — MORPHINE 2 MG/ML INJECTION: 2 mg/mL | INTRAMUSCULAR | Qty: 2

## 2009-04-30 NOTE — ED Notes (Signed)
 Formatting of this note might be different from the original.  Case management at bedside talking with pt  Electronically signed by Keven Rea RAMAN, RN at 04/30/2009  7:33 AM EDT

## 2009-04-30 NOTE — ED Notes (Signed)
 Formatting of this note might be different from the original.  Pt reports that she feels a lot better since having the valium.   Electronically signed by Okey Brooke RAMAN, RN at 04/30/2009  4:54 AM EDT

## 2009-04-30 NOTE — ED Notes (Signed)
 Formatting of this note might be different from the original.  Pt c/o generalized abd pain, requesting pain medicine; pt continues to be restless with intermittent AMS noted; discussed with dr katheryne, will not medicate per dr katheryne until evaluated by him  Electronically signed by Keven Rea RAMAN, RN at 04/30/2009  1:51 PM EDT

## 2009-04-30 NOTE — H&P (Signed)
 Formatting of this note is different from the original.  Hospitalist Admission Note    NAME: Breanna Lester   DOB:  1960-09-13   MRN:  [id number]     Date/Time:  04/30/2009 8:12 AM    ________________________________________________________________________   Assessment :    Plan:  1. Nausea with vomiting  2. Hypokalemia  3. Hypomagnesemia  4. Left distal ulnar/radius fracture  5. Mild AMS with hepatic encephalopathy 1. Antiemetics PRN, clear liquid diet  2. Replete potassium  3. replete Mag  4. Consult Ortho  5. Increase lactulose and repeat Ammonia level     Subjective:   CHIEF COMPLAINT:  vomiting and pain left forearm    HISTORY OF PRESENT ILLNESS:     Breanna Lester is a 48 y.o.  Caucasian female whom presents with complaint of left forearm pain, nausea and vomiting.  Patient with a hx of cirrohsis presents ambulatory to ED with cc of vomiting. Pt's relative reports nausea and vomiting all day. Per family member, when pt experiences N/V secondary to cirrhosis, it usually is associated with a fever. Pt's relative reports that pt fractured her ulna and radius 2 days ago after falling and that fractures were never set, just wrapped. Pt's relative also notes that pt's vision has been worsening, indicating this as the reason she fell 2 days ago   We were asked to admit for work up and evaluation of the above problems.     Past Medical History   Diagnosis Date   ? Liver disease 2009     liver failure   ? Depression    ? Anxiety        Past Surgical History   Procedure Date   ? Hx tonsillectomy 1972   ? Hx adenoidectomy 1972   ? Hx cesarean section 1986 and 1993   ? Hx hysterectomy 2008   ? Hx dilation and curettage 1999     History   Substance Use Topics   ? Tobacco Use: Quit     Quit date: 02/28/2008   ? Alcohol Use: No       Family History   Problem Relation Age of Onset   ? Cancer Mother    ? Heart Disease Father        No Known Allergies   Prior to Admission medications    Medication Sig Start Date End Date Taking?  Authorizing Provider   furosemide  (LASIX ) 80 mg tablet Take 80 mg by mouth daily.   Yes Breanna Other, Breanna Lester   furosemide  (LASIX ) 80 mg tablet TAKE 1 TABLET BY MOUTH EVERY DAY 02/24/09   Breanna Lash, Breanna Lester   sertraline  (ZOLOFT ) 100 mg tablet TAKE 1 TABLET BY MOUTH EVERY DAY 03/25/09   Breanna Ali, Breanna Lester   furosemide  (LASIX ) 80 mg tablet TAKE 1 TABLET BY MOUTH EVERY DAY 03/25/09   Breanna Ali, Breanna Lester   bumetanide  (BUMEX ) 1 mg tablet TAKE 2 TABLETS BY MOUTH EVERY DAY 01/22/09   Breanna Ali, Breanna Lester   spironolactone (ALDACTONE) 50 mg tablet Take 50 mg by mouth daily.    Historical Provider   lactulose (CHRONULAC) 10 gram/15 mL solution Take 15 mL by mouth two (2) times a day. 10/28/08   Breanna Lash, Breanna Lester     REVIEW OF SYSTEMS:     []   Unable to obtain  ROS due to  []  mental status change  []  sedated   []  intubated   [x]  Total of 12 systems reviewed as follows:  Constitutional: negative fever, negative chills, negative  weight loss  Eyes:   negative visual changes  ENT:   negative sore throat, tongue or lip swelling  Respiratory:  negative cough, negative dyspnea  Cards:  negative for chest pain, palpitations, lower extremity edema  GI:   negative for nausea, vomiting, diarrhea, and abdominal pain  GU:  negative for frequency, dysuria  Integument:  negative for rash and pruritus  Heme:  negative for easy bruising and gum/nose bleeding  Musculoskel: positive for musculoskeletal pain left forearm/wrist,  back pain and muscle weakness  Neuro:  negative for headaches, dizziness, vertigo  Psych:  negative for feelings of anxiety, depression   Travel?: none    Objective:   VITALS:    Visit Vitals   Item Reading   ? BP 123/59   ? Pulse 92   ? Temp 98.6 F (37 C)   ? Resp 18   ? Ht 5' 2 (1.575 m)   ? Wt 162 lb 11.2 oz (73.8 kg)   ? SpO2 95%     PHYSICAL EXAM:  Gen:  [x]   WD [x]   WN []  cachectic []   thin []   obese []   disheveled             []   ill apearing  []    Critical  []    Chronic    [x]   No acute distress    HEENT:   [x]  NC/AT/PERRLA/EOMI    []  pink  conjunctivae      []  pale conjunctivae                  PERRL  []  yes  []  no      []  moist mucosa    []  dry mucosa    hearing intact to voice [x]  yes  []  No    NECK:   supple [x]  yes  []  no        masses []  yes  [x]  No               thyroid  [x]   non tender  []   tender    RESP:   [x]  CTA bialterally/no wheezing/rhonchi/rales/crackles    []  clear bilaterally  []  wheezing   []  rhonchi   []  crackles     use of accessory muscles []  yes [x]  no    CARD:   [x]   regular rate and rhythm/No murmurs/rubs/gallops    murmur  []  yes ()  []  no      Rubs  []  yes  []  no       Gallops []  yes  []  no    Rate []   regular  []   irregular        carotid bruits  []  Right  []   Left                 LE edema []  yes  [x]  no           JVP  []   yes   [x]   no    ABD:    [x]  soft/non distended/non tender/+bowel sounds/no HSM    []   Rigid    tenderness []  yes []  no   Liver enlargement  []   yes []   no                Spleen enlargement  []   yes []   no     distended []   yes []  no     bowel sound  []  hypoactive   []  hyperactive    SKIN:  Rashes []   yes   [x]   no    Ulcers []   yes   [x]   no               []  tight to palpitation    skin turgor [x]   good  []  poor  []  decreased               Cyanosis/clubbing []   yes [x]   no    NEUR:   [x]  cranial nerves II-XII grossly intact       []  Cranial nerves deficit                 []   facial droop    []   slurred speech   []  aphasic     [x]  Strength normal     []   weakness  []   LUE  []    RUE/ []   LLE  []    RLE    follows commands  [x]   yes []   no           PSYCH:   insight []  poor [x]  good   Alert and Oriented to  [x]  person  [x]  place  [x]   time                    []  depressed [x]  anxious []  agitated  []  lethargic []  stuporous  []  sedated     LAB DATA REVIEWED:    Recent Results (from the past 24 hour(s))   CBC WITH AUTOMATED DIFF    Collection Time    04/29/09 10:45 PM   Component Value Range   ? WBC 7.9  3.6 - 11.0 (K/uL)   ? RBC 3.81  3.80 - 5.20 (M/uL)   ? HGB 10.1 (*) 11.5 - 16.0 (g/dL)   ? HCT 30.9 (*) 35.0 -  47.0 (%)   ? MCV 81.1  80.0 - 99.0 (FL)   ? MCH 26.5  26.0 - 34.0 (PG)   ? MCHC 32.7  30.0 - 36.5 (g/dL)   ? RDW 15.5 (*) 11.5 - 14.5 (%)   ? PLATELET 85 (*) 150 - 400 (K/uL)   ? NEUTROPHILS 64  32 - 75 (%)   ? LYMPHOCYTES 21  12 - 49 (%)   ? MONOCYTES 11  5 - 13 (%)   ? EOSINOPHILS 3  0 - 7 (%)   ? BASOPHILS 1  0 - 1 (%)   ? ABSOLUTE NEUTS 5.2  1.8 - 8.0 (K/UL)   ? ABSOLUTE LYMPHS 1.6  0.8 - 3.5 (K/UL)   ? ABSOLUTE MONOS 0.8  0.0 - 1.0 (K/UL)   ? ABSOLUTE EOSINS 0.2  0.0 - 0.4 (K/UL)   ? ABSOLUTE BASOS 0.1  0.0 - 0.1 (K/UL)   METABOLIC PANEL, COMPREHENSIVE    Collection Time    04/29/09 10:45 PM   Component Value Range   ? Sodium 134 (*) 136 - 145 (MMOL/L)   ? Potassium 2.2 (*) 3.5 - 5.1 (MMOL/L)   ? Chloride 97  97 - 108 (MMOL/L)   ? CO2 31  21 - 32 (MMOL/L)   ? Anion gap 6  5 - 15 (mmol/L)   ? Glucose 92  65 - 100 (MG/DL)   ? BUN 13  6 - 20 (MG/DL)   ? Creatinine 1.0  0.6 - 1.3 (MG/DL)   ? BUN/Creatinine ratio 13  12 - 20 ( )   ? GFR est AA >60  >60 (ml/min/1.28m2)   ? GFR  est non-AA >60  >60 (ml/min/1.39m2)   ? Calcium 8.8  8.5 - 10.1 (MG/DL)   ? Bilirubin, total 3.3 (*) 0.2 - 1.0 (MG/DL)   ? ALT 30  12 - 78 (U/L)   ? AST 40 (*) 15 - 37 (U/L)   ? Alk. phosphatase 218 (*) 50 - 136 (U/L)   ? Protein, total 7.0  6.4 - 8.2 (g/dL)   ? Albumin 3.3 (*) 3.5 - 5.0 (g/dL)   ? Globulin 3.7  2.0 - 4.0 (g/dL)   ? A-G Ratio 0.9 (*) 1.1 - 2.2 ( )   ETHYL ALCOHOL    Collection Time    04/29/09 11:38 PM   Component Value Range   ? ALCOHOL(ETHYL),SERUM <10  <10 (MG/DL)   CK W/ CKMB & INDEX    Collection Time    04/29/09 11:38 PM   Component Value Range   ? CK 125  21 - 215 (U/L)   ? CK - MB 5.1 (*) 0.5 - 3.6 (NG/ML)   ? CK-MB Index 4.1  ( )   TROPONIN I    Collection Time    04/29/09 11:38 PM   Component Value Range   ? Troponin-I, Qt. 0.06 (*) <0.05 (ng/mL)   AMMONIA    Collection Time    04/29/09 11:38 PM   Component Value Range   ? Ammonia 52 (*) <32 (UMOL/L)   URINALYSIS W/ REFLEX CULTURE    Collection Time    04/30/09 12:14 AM    Component Value Range   ? Color YELLOW     ? Appearance CLEAR     ? Specific gravity 1.011  1.003 - 1.030 ( )   ? pH 6.0  5.0 - 8.0 ( )   ? Protein NEGATIVE   NEGATIVE (MG/DL)   ? Glucose NEGATIVE   NEGATIVE (MG/DL)   ? Ketone NEGATIVE   NEGATIVE (MG/DL)   ? Bilirubin NEGATIVE   NEGATIVE    ? Blood NEGATIVE   NEGATIVE    ? Urobilinogen 2.0 (*) 0.2 - 1.0 (EU/DL)   ? Nitrites NEGATIVE   NEGATIVE    ? Leukocyte Esterase TRACE (*) NEGATIVE    ? WBC 0-4  0 - 4 (/HPF)   ? RBC 0-3  0 - 5 (/HPF)   ? Epithelial cells 10-20  0 - 5 (/LPF)   ? Bacteria NEGATIVE   NEGATIVE (/HPF)   ? UA:UC IF INDICATED CULTURE NOT INDICATED BY UA RESULT     ? Hyaline Cast 0-2  0 - 2    PROTHROMBIN TIME    Collection Time    04/30/09  6:30 AM   Component Value Range   ? INR 1.5 (*) 0.9 - 1.1 ( )   ? Prothrombin Time-PT 14.5 (*) 9.0 - 11.0 (SECS)   MAGNESIUM     Collection Time    04/30/09  6:30 AM   Component Value Range   ? Magnesium  1.4 (*) 1.6 - 2.4 (MG/DL)     Procedures: see electronic medical records for all procedures/Xrays and details which were not copied into this note but were reviewed prior to creation of Plan.      IMAGING RESULTS:   []    I have personally reviewed the actual   []  CXR  [x]  CT scan    ________________________________________________________________________  Risk of deterioration:  []  Low    [x]  Moderate  []  High    Prophylaxis:  [x]  Lovenox   []  Coumadin  []  Hep SQ  []   SCD?s  []  H2B/PPI    Disposition:  [x]  Home w/ Family   []  HH PT,OT,RN   []  SNF/LTC   []  SAH/Rehab    Discussed Code Status:    [x]  Full Code      []  DNR     ________________________________________________________________________    Care Plan discussed with:    [x]  Patient   []  Family    []  ED Care Manager    []   RN   [x]  ED Doc   []  Specialist :     [x]   Total Time Coordinating Admission NOT critical care: 52 minutes   []   Total Critical Care Time NOT procedure based:   minutes  ________________________________________________________________________  Admitting Physician: ELSIE VICCI MOULD, Breanna Lester   Electronically signed by VICCI ELSIE III at 04/30/2009  8:34 AM EDT

## 2009-04-30 NOTE — ED Notes (Signed)
 Formatting of this note might be different from the original.  Pt very restless, moving all around in bed, states nothing is wrong, I just can't get comfortable, assisted pt up in bed, she has a pillow, offered blankets but she refused; 02 sats low 90's on room air, O2 applied @ 3L/min via NC  Electronically signed by Keven Rea RAMAN, RN at 04/30/2009  7:33 AM EDT

## 2009-04-30 NOTE — Progress Notes (Signed)
 Formatting of this note might be different from the original.  Notified Dr. Katheryne of K-2.4. Received orders. Will continue to monitor. KTSELLER RN  Electronically signed by Avel Cara DASEN, RN at 05/01/2009 12:43 AM EDT

## 2009-04-30 NOTE — ED Notes (Signed)
 Formatting of this note might be different from the original.  Pt tolerating crackers. Attempted to reposition pt but pt having hard time staying in one place. Pt laying down, then sitting up in a continuous cycle.  Electronically signed by Kassie Orvil MATSU, RN at 04/30/2009  3:36 AM EDT

## 2009-04-30 NOTE — ED Notes (Signed)
 Formatting of this note might be different from the original.  Dietary called for breakfast tray & pharmacy called for AM meds  Electronically signed by Keven Rea RAMAN, RN at 04/30/2009  9:04 AM EDT

## 2009-04-30 NOTE — ED Notes (Signed)
 Formatting of this note might be different from the original.  Pt requesting crackers, ok for pt to eat per Dr.Martin. Pt c/o potassium burning, infusion slowed. Pt now able to tolerate infusion.  Electronically signed by Kassie Orvil MATSU, RN at 04/30/2009  2:28 AM EDT

## 2009-04-30 NOTE — ED Notes (Signed)
 Formatting of this note might be different from the original.  Pt continues to be restless, climbing over rails and pulling BP and o2 monitors off; assisted pt up to chair to sit with table and breakfast tray given  Electronically signed by Keven Rea RAMAN, RN at 04/30/2009 10:37 AM EDT

## 2009-04-30 NOTE — ED Notes (Signed)
 Formatting of this note is different from the original.  TRANSFER - OUT REPORT:    Verbal report given to Clyde, RN on [name]  being transferred to [facility] for routine progression of care       Report consisted of patient?s Situation, Background, Assessment and   Recommendations(SBAR).     Information from the following report(s) SBAR was reveiwed with the receiving nurse.    Opportunity for questions and clarification was provided.        Electronically signed by Claudene Andrez PARAS, RN at 04/30/2009  6:15 PM EDT

## 2009-04-30 NOTE — Progress Notes (Signed)
 Formatting of this note might be different from the original.  Notified Dr. Vicci of pt's pain. Received orders . Will continue to monitor. [facility] RN  Electronically signed by Avel Cara DASEN, RN at 05/01/2009 12:45 AM EDT

## 2009-04-30 NOTE — ED Notes (Signed)
Dietary called for breakfast tray & pharmacy called for AM meds

## 2009-04-30 NOTE — ED Notes (Signed)
Pt requesting crackers, ok for pt to eat per Dr.Martin. Pt c/o potassium burning, infusion slowed. Pt now able to tolerate infusion.

## 2009-04-30 NOTE — H&P (Signed)
Hospitalist Admission Note    NAME: Breanna Lester   DOB:  [birth year]   MRN:  [id number]     Date/Time:  04/30/2009 8:12 AM    ________________________________________________________________________   Assessment :    Plan:  1. Nausea with vomiting  2. Hypokalemia  3. Hypomagnesemia  4. Left distal ulnar/radius fracture  5. Mild AMS with hepatic encephalopathy 1. Antiemetics PRN, clear liquid diet  2. Replete potassium  3. replete Mag  4. Consult Ortho  5. Increase lactulose and repeat Ammonia level       Subjective:   CHIEF COMPLAINT:  "vomiting and pain left forearm"    HISTORY OF PRESENT ILLNESS:     Breanna Lester is a 48 y.o.  Caucasian female whom presents with complaint of left forearm pain, nausea and vomiting.  Patient with a hx of cirrohsis presents ambulatory to ED with cc of vomiting. Pt's relative reports nausea and vomiting "all day".?? Per family member, when pt experiences N/V secondary to cirrhosis, it usually is associated with a fever. Pt's relative reports that pt fractured her ulna and radius 2 days ago after falling and that fractures were never "set", just "wrapped". Pt's relative also notes that pt's vision has been worsening, indicating this as the reason she fell 2 days ago   We were asked to admit for work up and evaluation of the above problems.     Past Medical History   Diagnosis Date   ??? Liver disease 2009     liver failure   ??? Depression    ??? Anxiety         Past Surgical History   Procedure Date   ??? Hx tonsillectomy 1972   ??? Hx adenoidectomy 1972   ??? Hx cesarean section 1986 and 1993   ??? Hx hysterectomy 2008   ??? Hx dilation and curettage 1999       History   Substance Use Topics   ??? Tobacco Use: Quit     Quit date: 02/28/2008   ??? Alcohol Use: No        Family History   Problem Relation Age of Onset   ??? Cancer Mother    ??? Heart Disease Father         No Known Allergies   Prior to Admission medications    Medication Sig Start Date End Date Taking? Authorizing Provider    furosemide (LASIX) 80 mg tablet Take 80 mg by mouth daily.   Yes Phys Other, MD   furosemide (LASIX) 80 mg tablet TAKE 1 TABLET BY MOUTH EVERY DAY 02/24/09   Mingo Amber, MD   sertraline (ZOLOFT) 100 mg tablet TAKE 1 TABLET BY MOUTH EVERY DAY 03/25/09   Mingo Amber, MD   furosemide (LASIX) 80 mg tablet TAKE 1 TABLET BY MOUTH EVERY DAY 03/25/09   Mingo Amber, MD   bumetanide (BUMEX) 1 mg tablet TAKE 2 TABLETS BY MOUTH EVERY DAY 01/22/09   Mingo Amber, MD   spironolactone (ALDACTONE) 50 mg tablet Take 50 mg by mouth daily.    Historical Provider   lactulose (CHRONULAC) 10 gram/15 mL solution Take 15 mL by mouth two (2) times a day. 10/28/08   Mingo Amber, MD       REVIEW OF SYSTEMS:     []   Unable to obtain  ROS due to  []  mental status change  []  sedated   []  intubated   [x]  Total of 12 systems reviewed as follows:  Constitutional: negative fever, negative chills, negative weight  loss  Eyes:   negative visual changes  ENT:   negative sore throat, tongue or lip swelling  Respiratory:  negative cough, negative dyspnea  Cards:  negative for chest pain, palpitations, lower extremity edema  GI:   negative for nausea, vomiting, diarrhea, and abdominal pain  GU:  negative for frequency, dysuria  Integument:  negative for rash and pruritus  Heme:  negative for easy bruising and gum/nose bleeding  Musculoskel: positive for musculoskeletal pain left forearm/wrist,  back pain and muscle weakness  Neuro:  negative for headaches, dizziness, vertigo  Psych:  negative for feelings of anxiety, depression   Travel?: none    Objective:   VITALS:    Visit Vitals   Item Reading   ??? BP 123/59   ??? Pulse 92   ??? Temp 98.6 ??F (37 ??C)   ??? Resp 18   ??? Ht 5\' 2"  (1.575 m)   ??? Wt 162 lb 11.2 oz (73.8 kg)   ??? SpO2 95%       PHYSICAL EXAM:  Gen:  [x]   WD [x]   WN []  cachectic []   thin []   obese []   disheveled             []   ill apearing  []    Critical  []    Chronic    [x]   No acute distress    HEENT:   [x]  NC/AT/PERRLA/EOMI     []  pink conjunctivae      []  pale conjunctivae                  PERRL  []  yes  []  no      []  moist mucosa    []  dry mucosa    hearing intact to voice [x]  yes  []  No                 NECK:   supple [x]  yes  []  no        masses []  yes  [x]  No               thyroid  [x]   non tender  []   tender    RESP:   [x]  CTA bialterally/no wheezing/rhonchi/rales/crackles    []  clear bilaterally  []  wheezing   []  rhonchi   []  crackles     use of accessory muscles []  yes [x]  no    CARD:   [x]   regular rate and rhythm/No murmurs/rubs/gallops    murmur  []  yes ()  []  no      Rubs  []  yes  []  no       Gallops []  yes  []  no    Rate []   regular  []   irregular        carotid bruits  []  Right  []   Left                 LE edema []  yes  [x]  no           JVP  []   yes   [x]   no    ABD:    [x]  soft/non distended/non tender/+bowel sounds/no HSM    []   Rigid    tenderness []  yes []  no   Liver enlargement  []   yes []   no                Spleen enlargement  []   yes []   no     distended []   yes []  no  bowel sound  []  hypoactive   []  hyperactive    SKIN:   Rashes []   yes   [x]   no    Ulcers []   yes   [x]   no               []  tight to palpitation    skin turgor [x]   good  []  poor  []  decreased               Cyanosis/clubbing []   yes [x]   no    NEUR:   [x]  cranial nerves II-XII grossly intact       []  Cranial nerves deficit                 []   facial droop    []   slurred speech   []  aphasic     [x]  Strength normal     []   weakness  []   LUE  []    RUE/ []   LLE  []    RLE    follows commands  [x]   yes []   no           PSYCH:   insight []  poor [x]  good   Alert and Oriented to  [x]  person  [x]  place  [x]   time                    []  depressed [x]  anxious []  agitated  []  lethargic []  stuporous  []  sedated     LAB DATA REVIEWED:    Recent Results (from the past 24 hour(s))   CBC WITH AUTOMATED DIFF    Collection Time    04/29/09 10:45 PM   Component Value Range   ??? WBC 7.9  3.6 - 11.0 (K/uL)   ??? RBC 3.81  3.80 - 5.20 (M/uL)   ??? HGB 10.1 (*) 11.5 - 16.0 (g/dL)    ??? HCT 30.9 (*) 35.0 - 47.0 (%)   ??? MCV 81.1  80.0 - 99.0 (FL)   ??? MCH 26.5  26.0 - 34.0 (PG)   ??? MCHC 32.7  30.0 - 36.5 (g/dL)   ??? RDW 15.5 (*) 11.5 - 14.5 (%)   ??? PLATELET 85 (*) 150 - 400 (K/uL)   ??? NEUTROPHILS 64  32 - 75 (%)   ??? LYMPHOCYTES 21  12 - 49 (%)   ??? MONOCYTES 11  5 - 13 (%)   ??? EOSINOPHILS 3  0 - 7 (%)   ??? BASOPHILS 1  0 - 1 (%)   ??? ABSOLUTE NEUTS 5.2  1.8 - 8.0 (K/UL)   ??? ABSOLUTE LYMPHS 1.6  0.8 - 3.5 (K/UL)   ??? ABSOLUTE MONOS 0.8  0.0 - 1.0 (K/UL)   ??? ABSOLUTE EOSINS 0.2  0.0 - 0.4 (K/UL)   ??? ABSOLUTE BASOS 0.1  0.0 - 0.1 (K/UL)   METABOLIC PANEL, COMPREHENSIVE    Collection Time    04/29/09 10:45 PM   Component Value Range   ??? Sodium 134 (*) 136 - 145 (MMOL/L)   ??? Potassium 2.2 (*) 3.5 - 5.1 (MMOL/L)   ??? Chloride 97  97 - 108 (MMOL/L)   ??? CO2 31  21 - 32 (MMOL/L)   ??? Anion gap 6  5 - 15 (mmol/L)   ??? Glucose 92  65 - 100 (MG/DL)   ??? BUN 13  6 - 20 (MG/DL)   ??? Creatinine 1.0  0.6 - 1.3 (MG/DL)   ??? BUN/Creatinine ratio 13  12 - 20 ( )   ???  GFR est AA >60  >60 (ml/min/1.37m2)   ??? GFR est non-AA >60  >60 (ml/min/1.54m2)   ??? Calcium 8.8  8.5 - 10.1 (MG/DL)   ??? Bilirubin, total 3.3 (*) 0.2 - 1.0 (MG/DL)   ??? ALT 30  12 - 78 (U/L)   ??? AST 40 (*) 15 - 37 (U/L)   ??? Alk. phosphatase 218 (*) 50 - 136 (U/L)   ??? Protein, total 7.0  6.4 - 8.2 (g/dL)   ??? Albumin 3.3 (*) 3.5 - 5.0 (g/dL)   ??? Globulin 3.7  2.0 - 4.0 (g/dL)   ??? A-G Ratio 0.9 (*) 1.1 - 2.2 ( )   ETHYL ALCOHOL    Collection Time    04/29/09 11:38 PM   Component Value Range   ??? ALCOHOL(ETHYL),SERUM <10  <10 (MG/DL)   CK W/ CKMB & INDEX    Collection Time    04/29/09 11:38 PM   Component Value Range   ??? CK 125  21 - 215 (U/L)   ??? CK - MB 5.1 (*) 0.5 - 3.6 (NG/ML)   ??? CK-MB Index 4.1  ( )   TROPONIN I    Collection Time    04/29/09 11:38 PM   Component Value Range   ??? Troponin-I, Qt. 0.06 (*) <0.05 (ng/mL)   AMMONIA    Collection Time    04/29/09 11:38 PM   Component Value Range   ??? Ammonia 52 (*) <32 (UMOL/L)   URINALYSIS W/ REFLEX CULTURE    Collection Time     04/30/09 12:14 AM   Component Value Range   ??? Color YELLOW     ??? Appearance CLEAR     ??? Specific gravity 1.011  1.003 - 1.030 ( )   ??? pH 6.0  5.0 - 8.0 ( )   ??? Protein NEGATIVE   NEGATIVE (MG/DL)   ??? Glucose NEGATIVE   NEGATIVE (MG/DL)   ??? Ketone NEGATIVE   NEGATIVE (MG/DL)   ??? Bilirubin NEGATIVE   NEGATIVE    ??? Blood NEGATIVE   NEGATIVE    ??? Urobilinogen 2.0 (*) 0.2 - 1.0 (EU/DL)   ??? Nitrites NEGATIVE   NEGATIVE    ??? Leukocyte Esterase TRACE (*) NEGATIVE    ??? WBC 0-4  0 - 4 (/HPF)   ??? RBC 0-3  0 - 5 (/HPF)   ??? Epithelial cells 10-20  0 - 5 (/LPF)   ??? Bacteria NEGATIVE   NEGATIVE (/HPF)   ??? UA:UC IF INDICATED CULTURE NOT INDICATED BY UA RESULT     ??? Hyaline Cast 0-2  0 - 2    PROTHROMBIN TIME    Collection Time    04/30/09  6:30 AM   Component Value Range   ??? INR 1.5 (*) 0.9 - 1.1 ( )   ??? Prothrombin Time-PT 14.5 (*) 9.0 - 11.0 (SECS)   MAGNESIUM    Collection Time    04/30/09  6:30 AM   Component Value Range   ??? Magnesium 1.4 (*) 1.6 - 2.4 (MG/DL)       Procedures: see electronic medical records for all procedures/Xrays and details which were not copied into this note but were reviewed prior to creation of Plan.      IMAGING RESULTS:   []    I have personally reviewed the actual   []  CXR  [x]  CT scan    ________________________________________________________________________  Risk of deterioration:  []  Low    [x]  Moderate  []  High  Prophylaxis:  [x]  Lovenox  []  Coumadin  []  Hep SQ  []  SCD???s  []  H2B/PPI    Disposition:  [x]  Home w/ Family   []  HH PT,OT,RN   []  SNF/LTC   []  SAH/Rehab    Discussed Code Status:    [x]  Full Code      []  DNR     ________________________________________________________________________    Care Plan discussed with:    [x]  Patient   []  Family    []  ED Care Manager    []   RN   [x]  ED Doc   []  Specialist :     [x]   Total Time Coordinating Admission NOT critical care: 52 minutes   []   Total Critical Care Time NOT procedure based:  minutes   ________________________________________________________________________  Admitting Physician: Elayne Guerin III, MD

## 2009-04-30 NOTE — ED Notes (Signed)
Pt very restless, moving all around in bed, states "nothing is wrong, I just can't get comfortable", assisted pt up in bed, she has a pillow, offered blankets but she refused; 02 sats low 90's on room air, O2 applied @ 3L/min via NC

## 2009-04-30 NOTE — ED Notes (Signed)
Pt tolerating crackers. Attempted to reposition pt but pt having hard time staying in one place. Pt laying down, then sitting up in a continuous cycle.

## 2009-04-30 NOTE — ED Notes (Signed)
Pt continues to be restless, climbing over rails and pulling BP and o2 monitors off; assisted pt up to chair to sit with table and breakfast tray given

## 2009-04-30 NOTE — Progress Notes (Signed)
Notified Dr. Skeet Latch of K-2.4. Received orders. Will continue to monitor. [facility] RN

## 2009-04-30 NOTE — Progress Notes (Signed)
Notified Dr. Laural Benes of pt's pain. Received orders . Will continue to monitor. [facility] RN

## 2009-04-30 NOTE — ED Notes (Signed)
TRANSFER - OUT REPORT:    Verbal report given to Elder Negus, RN on [name]  being transferred to [facility] for routine progression of care       Report consisted of patient???s Situation, Background, Assessment and   Recommendations(SBAR).     Information from the following report(s) SBAR was reveiwed with the receiving nurse.    Opportunity for questions and clarification was provided.

## 2009-04-30 NOTE — ED Notes (Signed)
Case management at bedside talking with pt

## 2009-04-30 NOTE — ED Provider Notes (Shared)
Patient is a 48 y.o. female presenting with vomiting.   Vomiting          Past Medical History   Diagnosis Date   ??? Liver disease 2009     liver failure   ??? Depression    ??? Anxiety           Past Surgical History   Procedure Date   ??? Hx tonsillectomy 1972   ??? Hx adenoidectomy 1972   ??? Hx cesarean section 1986 and 1993   ??? Hx hysterectomy 2008   ??? Hx dilation and curettage 1999           Family History   Problem Relation Age of Onset   ??? Cancer Mother    ??? Heart Disease Father           History   Social History   ??? Marital Status: Married     Spouse Name: N/A     Number of Children: N/A   ??? Years of Education: N/A   Occupational History   ??? Not on file.   Social History Main Topics   ??? Tobacco Use: Quit     Quit date: 02/28/2008   ??? Alcohol Use: No   ??? Drug Use:    ??? Sexually Active:    Other Topics Concern   ??? Not on file   Social History Narrative   ??? No narrative on file           ALLERGIES: Review of patient's allergies indicates no known allergies.      Review of Systems   Gastrointestinal: Positive for vomiting.       Filed Vitals:    04/30/2009  4:54 AM 04/30/2009  5:09 AM 04/30/2009  5:39 AM 04/30/2009  5:54 AM   BP: 117/63 176/156 100/37 105/90   Pulse: 81 85 86 86   Temp:       Resp: 23 23 23 17    Height:       Weight:       SpO2: 96% 97% 94% 95%              Physical Exam     Coding    Procedures

## 2009-04-30 NOTE — ED Notes (Signed)
Pt c/o generalized abd pain, requesting pain medicine; pt continues to be restless with intermittent AMS noted; discussed with dr Skeet Latch, will not medicate per dr Skeet Latch until evaluated by him

## 2009-04-30 NOTE — ED Notes (Signed)
Pt reports that she feels a lot better since having the valium.

## 2009-05-01 DIAGNOSIS — K7682 Hepatic encephalopathy: Secondary | ICD-10-CM

## 2009-05-01 LAB — METABOLIC PANEL, BASIC
Anion gap: 9 mmol/L (ref 5–15)
Anion gap: 6 mmol/L (ref 5–15)
BUN/Creatinine ratio: 10 — ABNORMAL LOW (ref 12–20)
BUN/Creatinine ratio: 12 (ref 12–20)
BUN: 10 MG/DL (ref 6–20)
BUN: 11 MG/DL (ref 6–20)
CO2: 27 MMOL/L (ref 21–32)
CO2: 27 MMOL/L (ref 21–32)
Calcium: 8.8 MG/DL (ref 8.5–10.1)
Calcium: 8.8 MG/DL (ref 8.5–10.1)
Chloride: 102 MMOL/L (ref 97–108)
Chloride: 105 MMOL/L (ref 97–108)
Creatinine: 1 MG/DL (ref 0.6–1.3)
Creatinine: 0.9 MG/DL (ref 0.6–1.3)
GFR est AA: >60 mL/min/{1.73_m2} (ref >60)
GFR est AA: >60 mL/min/{1.73_m2} (ref >60)
GFR est non-AA: >60 mL/min/{1.73_m2} (ref >60)
GFR est non-AA: >60 mL/min/{1.73_m2} (ref >60)
Glucose: 192 MG/DL — ABNORMAL HIGH (ref 65–100)
Glucose: 102 MG/DL — ABNORMAL HIGH (ref 65–100)
Potassium: 2.4 MMOL/L — CL (ref 3.5–5.1)
Potassium: 3.1 MMOL/L — ABNORMAL LOW (ref 3.5–5.1)
Sodium: 138 MMOL/L (ref 136–145)
Sodium: 138 MMOL/L (ref 136–145)

## 2009-05-01 LAB — HEPATIC FUNCTION PANEL
A-G Ratio: 0.8 — ABNORMAL LOW (ref 1.1–2.2)
ALT (SGPT): 29 U/L (ref 12–78)
AST (SGOT): 52 U/L — ABNORMAL HIGH (ref 15–37)
Albumin: 3.1 g/dL — ABNORMAL LOW (ref 3.5–5.0)
Alk. phosphatase: 200 U/L — ABNORMAL HIGH (ref 50–136)
Bilirubin, direct: 2.1 MG/DL — ABNORMAL HIGH (ref 0.0–0.2)
Bilirubin, total: 4.4 MG/DL — ABNORMAL HIGH (ref 0.2–1.0)
Globulin: 3.7 g/dL (ref 2.0–4.0)
Protein, total: 6.8 g/dL (ref 6.4–8.2)

## 2009-05-01 LAB — GLUCOSE, POC
Glucose (POC): 115 mg/dL — ABNORMAL HIGH (ref 65–105)
Glucose (POC): 162 mg/dL — ABNORMAL HIGH (ref 65–105)
Glucose (POC): 114 mg/dL — ABNORMAL HIGH (ref 65–105)

## 2009-05-01 LAB — CBC W/O DIFF
HCT: 29.5 % — ABNORMAL LOW (ref 35.0–47.0)
HGB: 9.4 g/dL — ABNORMAL LOW (ref 11.5–16.0)
MCH: 26.4 PG (ref 26.0–34.0)
MCHC: 31.9 g/dL (ref 30.0–36.5)
MCV: 82.9 FL (ref 80.0–99.0)
PLATELET: 92 10*3/uL — ABNORMAL LOW (ref 150–400)
RBC: 3.56 M/uL — ABNORMAL LOW (ref 3.80–5.20)
RDW: 16 % — ABNORMAL HIGH (ref 11.5–14.5)
WBC: 11.5 10*3/uL — ABNORMAL HIGH (ref 3.6–11.0)

## 2009-05-01 LAB — AMMONIA: Ammonia, plasma: 67 umol/L — ABNORMAL HIGH (ref <32)

## 2009-05-01 MED ADMIN — HYDROmorphone (PF) (DILAUDID) injection 1 mg: INTRAVENOUS | @ 03:00:00 | NDC 00409255201

## 2009-05-01 MED ADMIN — enoxaparin (LOVENOX) injection 40 mg: SUBCUTANEOUS | @ 14:00:00 | NDC 00075062040

## 2009-05-01 MED ADMIN — potassium chloride 10 mEq in 100 ml IVPB: INTRAVENOUS | @ 17:00:00 | NDC 00338070948

## 2009-05-01 MED ADMIN — SALINE PERIPHERAL FLUSH Q8H 5 mL: @ 10:00:00 | NDC 87701099893

## 2009-05-01 MED ADMIN — potassium chloride 10 mEq in 100 ml IVPB: INTRAVENOUS | @ 18:00:00 | NDC 00338070948

## 2009-05-01 MED ADMIN — lorazepam (ATIVAN) tablet 1 mg: ORAL | @ 18:00:00 | NDC 51079038601

## 2009-05-01 MED ADMIN — potassium chloride 10 mEq in 100 ml IVPB: INTRAVENOUS | @ 20:00:00 | NDC 00409707426

## 2009-05-01 MED ADMIN — lactulose (CHRONULAC) solution 20 g: ORAL | @ 13:00:00 | NDC 00121457730

## 2009-05-01 MED ADMIN — oxycodone (ROXICODONE) tablet 10 mg: ORAL | @ 10:00:00 | NDC 00406055262

## 2009-05-01 MED ADMIN — SALINE PERIPHERAL FLUSH Q8H 5 mL: @ 02:00:00 | NDC 87701099893

## 2009-05-01 MED ADMIN — lactulose (CHRONULAC) solution 20 g: ORAL | @ 14:00:00 | NDC 00121457730

## 2009-05-01 MED ADMIN — HYDROmorphone (PF) (DILAUDID) injection 1 mg: INTRAVENOUS | @ 07:00:00 | NDC 00409255201

## 2009-05-01 MED ADMIN — potassium chloride 10 mEq in 100 ml IVPB: INTRAVENOUS | @ 20:00:00 | NDC 00338070948

## 2009-05-01 MED ADMIN — potassium chloride 10 mEq in 100 ml IVPB: INTRAVENOUS | @ 01:00:00 | NDC 00338070948

## 2009-05-01 MED ADMIN — potassium chloride SR (KLOR-CON 10) tablet 60 mEq: ORAL | @ 05:00:00 | NDC 00074780419

## 2009-05-01 MED ADMIN — potassium chloride SR (KLOR-CON 10) tablet 40 mEq: ORAL | @ 10:00:00 | NDC 00074780419

## 2009-05-01 MED ADMIN — lorazepam (ATIVAN) tablet 1 mg: ORAL | @ 02:00:00 | NDC 51079038601

## 2009-05-01 MED ADMIN — potassium chloride SR (KLOR-CON 10) tablet 40 mEq: ORAL | @ 22:00:00 | NDC 00074780419

## 2009-05-01 MED ADMIN — potassium chloride 10 mEq in 100 ml IVPB: INTRAVENOUS | @ 03:00:00 | NDC 00338070948

## 2009-05-01 MED ADMIN — pantoprazole (PROTONIX) tablet 40 mg: ORAL | @ 13:00:00 | NDC 00008084181

## 2009-05-01 MED ADMIN — potassium chloride SR (KLOR-CON 10) tablet 40 mEq: ORAL | @ 14:00:00 | NDC 00074780419

## 2009-05-01 MED ADMIN — oxycodone (ROXICODONE) tablet 10 mg: ORAL | @ 05:00:00 | NDC 00406055262

## 2009-05-01 MED ADMIN — HYDROmorphone (PF) (DILAUDID) injection 1 mg: INTRAVENOUS | @ 13:00:00 | NDC 00409255201

## 2009-05-01 MED ADMIN — spironolactone (ALDACTONE) tablet 50 mg: ORAL | @ 13:00:00 | NDC 51079010301

## 2009-05-01 MED ADMIN — saline peripheral flush 5 mL: @ 13:00:00 | NDC 87701099893

## 2009-05-01 MED ADMIN — sertraline (ZOLOFT) tablet 100 mg: ORAL | @ 13:00:00 | NDC 00904608861

## 2009-05-01 MED FILL — SPIRONOLACTONE 25 MG TAB: 25 mg | ORAL | Qty: 2

## 2009-05-01 MED FILL — LACTULOSE 10 GRAM/15 ML ORAL SOLN: 10 gram/15 mL | ORAL | Qty: 30

## 2009-05-01 MED FILL — K-TAB 10 MEQ TABLET,EXTENDED RELEASE: 10 mEq | ORAL | Qty: 4

## 2009-05-01 MED FILL — HYDROMORPHONE (PF) 1 MG/ML IJ SOLN: 1 mg/mL | INTRAMUSCULAR | Qty: 1

## 2009-05-01 MED FILL — SERTRALINE 50 MG TAB: 50 mg | ORAL | Qty: 2

## 2009-05-01 MED FILL — K-TAB 10 MEQ TABLET,EXTENDED RELEASE: 10 mEq | ORAL | Qty: 6

## 2009-05-01 MED FILL — SALINE FLUSH INJECTION SYRINGE: INTRAMUSCULAR | Qty: 10

## 2009-05-01 MED FILL — POTASSIUM CHLORIDE 10 MEQ/100 ML IV PIGGY BACK: 10 mEq/0 mL | INTRAVENOUS | Qty: 100

## 2009-05-01 MED FILL — LOVENOX 40 MG/0.4 ML SUBCUTANEOUS SYRINGE: 40 mg/0.4 mL | SUBCUTANEOUS | Qty: 1

## 2009-05-01 MED FILL — SALINE FLUSH INJECTION SYRINGE: INTRAMUSCULAR | Qty: 20

## 2009-05-01 MED FILL — LORAZEPAM 1 MG TAB: 1 mg | ORAL | Qty: 1

## 2009-05-01 MED FILL — OXYCODONE 5 MG TAB: 5 mg | ORAL | Qty: 2

## 2009-05-01 MED FILL — SALINE FLUSH INJECTION SYRINGE: INTRAMUSCULAR | Qty: 30

## 2009-05-01 MED FILL — POTASSIUM CHLORIDE 10 MEQ/100 ML IV PIGGY BACK: 10 mEq/0 mL | INTRAVENOUS | Qty: 200

## 2009-05-01 MED FILL — PROTONIX 40 MG TABLET,DELAYED RELEASE: 40 mg | ORAL | Qty: 1

## 2009-05-01 NOTE — Progress Notes (Signed)
 Formatting of this note might be different from the original.  Pt experienced watery brown stool x2, potassium pills noted in stool. Pt complains of leg cramps. Tele-NSR-sinus tach. Bp 127/60, hr 96-103. Pt increasingly agitated, restless. A&Ox4 with confusion, aphasic. MD notified (Dr. Katheryne) for possible re-assessment.  Electronically signed by [place], Erica M, RN at 05/01/2009 12:12 PM EDT

## 2009-05-01 NOTE — Progress Notes (Signed)
 Formatting of this note might be different from the original.  Pt remains confused and inappropriate in conversation and actions. Sitter remains at bedside for pt safety.  Electronically signed by Annah Lavetta NOVAK, LPN at 90/88/7989  8:25 PM EDT

## 2009-05-01 NOTE — Consults (Signed)
 Associated Order(s): IP CONSULT TO ORTHOPEDIC SURGERY  Formatting of this note is different from the original.  Pt with hepatic encephalopathy, left distal radius and ulna fracture, sustained Sept 7th.    Past Medical History   Diagnosis Date   ? Liver disease 2009     liver failure   ? Depression    ? Anxiety      Past Surgical History   Procedure Date   ? Hx tonsillectomy 1972   ? Hx adenoidectomy 1972   ? Hx cesarean section 1986 and 1993   ? Hx hysterectomy 2008   ? Hx dilation and curettage 1999     History   Substance Use Topics   ? Tobacco Use: Quit     Quit date: 02/28/2008   ? Alcohol Use: No     Family History   Problem Relation Age of Onset   ? Cancer Mother    ? Heart Disease Father      No Known Allergies   Prior to Admission medications    Medication Sig Start Date End Date Taking? Authorizing Provider   furosemide (LASIX) 80 mg tablet Take 80 mg by mouth daily.   Yes Phys Other, MD   furosemide (LASIX) 80 mg tablet TAKE 1 TABLET BY MOUTH EVERY DAY 02/24/09   Sabina Ali, MD   sertraline  (ZOLOFT ) 100 mg tablet TAKE 1 TABLET BY MOUTH EVERY DAY 03/25/09   Sabina Ali, MD   furosemide (LASIX) 80 mg tablet TAKE 1 TABLET BY MOUTH EVERY DAY 03/25/09   Sabina Ali, MD   bumetanide (BUMEX) 1 mg tablet TAKE 2 TABLETS BY MOUTH EVERY DAY 01/22/09   Sabina Ali, MD   spironolactone (ALDACTONE) 50 mg tablet Take 50 mg by mouth daily.    Historical Provider   lactulose (CHRONULAC) 10 gram/15 mL solution Take 15 mL by mouth two (2) times a day. 10/28/08   Annalee Lash, MD     REVIEW OF SYSTEMS:   [ ]  Unable to obtain ROS due to [ ]  mental status change [ ]  sedated [ ]  intubated  [X]  Total of 12 systems reviewed as follows:  Constitutional: negative fever, negative chills, negative weight loss  Eyes: negative visual changes  ENT: negative sore throat, tongue or lip swelling  Respiratory:  negative cough, negative dyspnea  Cards: negative for chest pain, palpitations, lower extremity edema  GI: negative for nausea, vomiting, diarrhea, and abdominal pain  HL:wzhjupcz for frequency, dysuria  Integument: negative for rash and pruritus  Heme:negative for easy bruising and gum/nose bleeding  Musculoskel:positive for musculoskeletal pain left forearm/wrist, back pain and muscle weakness  Neuro:negative for headaches, dizziness, vertigo  Psych:negative for feelings of anxiety, depression   Travel?:none    Objective:   VITALS:   Visit Vitals   Item Reading   ? BP 123/59   ? Pulse 92   ? Temp 98.6 F (37 C)   ? Resp 18   ? Ht 5' 2 (1.575 m)   Pt is not really responsive to verbal stimuli at this point.  Her L forearm is in a coaptation splint.  Fingers are not swollen.    X-rays reviewed:  I found an angulated (25degrees) extra articular left distal radius fracture.    Imp: as above, unstable L distal radius / ulna fracture in a patient with encephalopathy.  Unable to have meaningful conversation with the patient at this point.    Plan:  When patient is stable, recommend ORIF of L distal radius  fracture.  Please inform me of when she might be ready.  Either her mental status will have to improve, or consent would have to be obtained from someone with power of attorney.    Thanks,    Lupita NOVAK. Boyd, MD      Electronically signed by Boyd Lupita NOVAK, MD at 05/01/2009  6:57 PM EDT

## 2009-05-01 NOTE — Progress Notes (Signed)
 Formatting of this note might be different from the original.  Notified Dr. Vicci per Dr. Eileen Order to call if K less the 3.2. Received order will continue to monitor. [facility] RN  Electronically signed by Avel Cara DASEN, RN at 05/01/2009  5:14 AM EDT

## 2009-05-01 NOTE — Consults (Signed)
Pt with hepatic encephalopathy, left distal radius and ulna fracture, sustained Sept 7th.      Past Medical History??   Diagnosis?? Date??   ????? Liver disease?? 2009??   ?? ?? liver failure??   ????? Depression?? ??   ????? Anxiety?? ??   ????  Past Surgical History??   Procedure?? Date??   ????? Hx tonsillectomy?? 1972??   ????? Hx adenoidectomy?? 1972??   ????? Hx cesarean section?? 1986 and 1993??   ????? Hx hysterectomy?? 2008??   ????? Hx dilation and curettage?? 1999??   ????  History??   Substance Use Topics??   ????? Tobacco Use:?? Quit??   ?? ?? Quit date:?? 02/28/2008??   ????? Alcohol Use:?? No??   ??  Family History??   Problem?? Relation?? Age of Onset??   ????? Cancer?? Mother?? ??   ????? Heart Disease?? Father?? ??   ??  No Known Allergies ??  Prior to Admission medications ??   Medication?? Sig?? Start Date?? End Date?? Taking??? Authorizing Provider??   furosemide (LASIX) 80 mg tablet?? Take 80 mg by mouth daily.?? ?? ?? Yes?? Phys Other, MD??   furosemide (LASIX) 80 mg tablet?? TAKE 1 TABLET BY MOUTH EVERY DAY?? 02/24/09?? ?? ?? Mingo Amber, MD??   sertraline (ZOLOFT) 100 mg tablet?? TAKE 1 TABLET BY MOUTH EVERY DAY?? 03/25/09?? ?? ?? Mingo Amber, MD??   furosemide (LASIX) 80 mg tablet?? TAKE 1 TABLET BY MOUTH EVERY DAY?? 03/25/09?? ?? ?? Mingo Amber, MD??   bumetanide (BUMEX) 1 mg tablet?? TAKE 2 TABLETS BY MOUTH EVERY DAY?? 01/22/09?? ?? ?? Mingo Amber, MD??   spironolactone (ALDACTONE) 50 mg tablet?? Take 50 mg by mouth daily.?? ?? ?? ?? Historical Provider??   lactulose (CHRONULAC) 10 gram/15 mL solution?? Take 15 mL by mouth two (2) times a day.?? 10/28/08?? ?? ?? Mingo Amber, MD??     REVIEW OF SYSTEMS:?? ??  [ ]  Unable to obtain?? ROS due to?? [ ]  mental status change?? [ ]  sedated???? [ ]  intubated  ??[X]  Total of 12 systems reviewed as follows:  Constitutional: negative fever, negative chills, negative weight loss  Eyes: ??????????????????????????negative visual changes  ENT: ????????????????????????????negative sore throat, tongue or lip swelling  Respiratory: ????negative cough, negative dyspnea   Cards: ??????????????????????negative for chest pain, palpitations, lower extremity edema  GI: ????????????????????????????????????negative for nausea, vomiting, diarrhea, and abdominal pain  GU:??????????????????????????????????negative for frequency, dysuria  Integument: ??????negative for rash and pruritus  Heme:??????????????????????????negative for easy bruising and gum/nose bleeding  Musculoskel:????positive for musculoskeletal pain left forearm/wrist,?? back pain and muscle weakness  Neuro:??????????????????????????negative for headaches, dizziness, vertigo  Psych:??????????????????????????negative for feelings of anxiety, depression ??  Travel?:????????????????????none  ????  Objective:??   VITALS:?? ??  Visit Vitals??   Item?? Reading??   ????? BP?? 123/59??   ????? Pulse?? 92??   ????? Temp?? 98.6 ??F (37 ??C)??   ????? Resp?? 18??   ????? Ht?? 5\' 2"  (1.575 m)??   Pt is not really responsive to verbal stimuli at this point.  Her L forearm is in a coaptation splint.  Fingers are not swollen.    X-rays reviewed:  I found an angulated (25degrees) extra articular left distal radius fracture.    Imp: as above, unstable L distal radius / ulna fracture in a patient with encephalopathy.  Unable to have meaningful conversation with the patient at this point.    Plan:  When patient is stable, recommend ORIF of L distal radius fracture.  Please inform me of when she might be ready.  Either her mental status  will have to improve, or consent would have to be obtained from someone with power of attorney.    Thanks,    Karie Georges. Janann August, MD

## 2009-05-01 NOTE — Progress Notes (Deleted)
Hospitalist Progress Note    NAME:  Breanna Lester   DOB:   17-Sep-1960   MRN:   [id number]     Date/Time:  05/01/2009 8:55 AM  Subjective:   Chief Complaint:  F/u hypokalemia, N/V, hepatic encephalopathy  Less responsive today, still trashing about the bed. Only admits to pain in  Left arm.  Still with diarrhea with lactulose  Spoke with husband, notes often gets agitated, confused with acute illness:   Common pattern in past  No alcohol in years per husband.CXR with ? Pneumonia, some cough and low grade fever today.      Review of Systems:  Y  N       Y  N  []       [x]        Fever/chills                                               []       [x]        Chest Pain  []       [x]        Cough                                                       [x]       []        Diarrhea with lactulose  []       [x]        Sputum                                                     []       [x]        Constipation  []       [x]        SOB/DOE                                                []       [x]        Nausea/Vomit  []       [x]        Abd Pain                                                    []       []        Tolerating PT  []       [x]        Dysuria                                                      []       []   Tolerating Diet     []      Unable to obtain  ROS due to  []      mental status change  []      sedated   []      intubated     Past Med History and Social history reviewed. No changes.   [x]      Current Medication list and allergies reviewed*    Objective:   Vitals:  BP 112/77   Pulse 104   Temp 98.6 ??F (37 ??C)   Resp 22   Ht 5\' 2"  (1.575 m)   Wt 162 lb 11.2 oz (73.8 kg)   SpO2 89%  Temp (24hrs), Avg:98.6 ??F (37 ??C), Min:98.4 ??F (36.9 ??C), Max:98.6 ??F (37 ??C)      Last 24hr Input/Output:  No intake or output data in the 24 hours ending 05/01/09 0851     PHYSICAL EXAM:  General:???? ???????????? Alert, constantly moving in bed, slow but oriented  Head:???? ??????????????????????Normocephalic, atraumatic.     PERRL, sclera non icteric ????????????????????????   ?????????????????????????? Oropharynx clear, no exudates.  Neck:  No meningismus No masses  Lungs:?? ???????????????????? No accessory muscle use or retractions     Clear BS bilaterally  Heart: ????????????????????   No JVD     Regular rate and rhythm,?? No murmurs No Gallops.     No LE edema  Abdomen:?? ????????Soft, non-tender. Not distended.?? Bowel sounds normal.        LN:    No cervical or inguinal LN  Skin:?????????? ??????????????????No rashes  MS:  No joint swelling, erthema, warmth. No cyanosis/clubbing   Neurologic:????????  Alert, slow, not oriented today    CN intact    Motor exam nonfocal     [x]   Telemetry Reviewed     []   NSR []   PAC/PVCs   []   Afib  []   Paced   []   NSVT   []   Foley []   NGT  []   Intubated on vent     [x]     Reviewed most current laboratory data**   []     I have personally reviewed the   []   xray  []   CT scan    Assessment/Plan:     Patient Active Hospital Problem List:    Altered mental status likely metabolic encephalopathy   Ammonia and hypokalemia improved   ? underlying pneumonia, start empiric antibiotics   Check [place] to rule out ascites, GB disease   Unable to cooperate for CT scan, moving all limbs well.    Nausea with vomiting (05/01/2009) resolved.    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    Hypomagnesemia (05/01/2009)    ___________________________________________________  Risk of deterioration:  []   Low    [x]   Moderate  []   High  __________________________________________    Total time spent with patient:  []   15   [x]   25   []   35   []    __ minutes    []   Critical Care Provided    Care Plan discussed with:    [x]   Patient   []   Family    []   Care Manager    [x]   Nursing   []   Consultant/Specialist :      []     >50% of visit spent in  counseling and coordination of care    (Discussed []   CODE status,  []   Care Plan, []   D/C Planning)    Prophylaxis:  [x]   Lovenox  []   Coumadin  []   Hep SQ  []   SCD???s  [x]   H2B/PPI    Disposition:  [x]   Home w/ Family   []   HH PT,OT,RN   []   SNF/LTC   []   SAH/Rehab  ___________________________________________________    Hospitalist: Tawny Asal., MD     ___________________________________________________    *Medications reviewed:  Current facility-administered medications   Medication Dose Route Frequency   ??? potassium chloride SR (KLOR-CON 10) tablet 40 mEq  40 mEq Oral NOW   ??? potassium chloride SR (KLOR-CON 10) tablet 40 mEq  40 mEq Oral TID   ??? lactulose (CHRONULAC) solution 20 g  20 g Oral BID   ??? sertraline (ZOLOFT) tablet 100 mg  100 mg Oral DAILY   ??? spironolactone (ALDACTONE) tablet 50 mg  50 mg Oral DAILY   ??? SALINE PERIPHERAL FLUSH Q8H 5 mL  5 mL InterCATHeter Q8H   ??? saline peripheral flush 5 mL  5 mL InterCATHeter PRN   ??? acetaminophen (TYLENOL) solution 650 mg  650 mg Oral Q4H PRN   ??? pantoprazole (PROTONIX) tablet 40 mg  40 mg Oral DAILY   ??? magnesium sulfate 2 g/50 ml IVPB   2 g IntraVENous ONCE   ??? potassium chloride 10 mEq in 100 ml IVPB   10 mEq IntraVENous Q2H   ??? lorazepam (ATIVAN) tablet 1 mg  1 mg Oral Q6H PRN   ??? oxycodone (ROXICODONE) tablet 10 mg  10 mg Oral Q4H PRN   ??? prochlorperazine (COMPAZINE) injection 5 mg  5 mg IntraVENous Q6H PRN   ??? potassium chloride 10 mEq in 100 ml IVPB   10 mEq IntraVENous Q1H   ??? lactulose (CHRONULAC) solution 20 g  20 g Oral DAILY   ??? potassium chloride SR (KLOR-CON 10) tablet 60 mEq  60 mEq Oral ONCE   ??? HYDROmorphone (PF) (DILAUDID) injection 1 mg  1 mg IntraVENous Q4H PRN   ??? DISCONTD: lactulose (CHRONULAC) solution 10 g  10 g Oral TID   ??? DISCONTD: promethazine (PHENERGAN) injection 12.5 mg  12.5 mg IntraVENous Q6H PRN   ??? DISCONTD: HYDROmorphone (PF) (DILAUDID) injection 1 mg  1 mg IntraVENous Q4H PRN    ??? DISCONTD: potassium chloride SR (KLOR-CON 10) tablet 40 mEq  40 mEq Oral Q6H   ??? DISCONTD: oxycodone (OXYIR) capsule 10 mg  10 mg Oral Q4H PRN   ??? DISCONTD: SALINE PERIPHERAL FLUSH Q8H 5 mL  5 mL InterCATHeter Q8H   ??? DISCONTD: saline peripheral flush 5 mL  5 mL InterCATHeter PRN          **Lab Data Reviewed:  Recent Results (from the past 24 hour(s))   METABOLIC PANEL, BASIC    Collection Time    04/30/09  8:00 PM   Component Value Range   ??? Sodium 138  136 - 145 (MMOL/L)   ??? Potassium 2.4 (*) 3.5 - 5.1 (MMOL/L)   ??? Chloride 102  97 - 108 (MMOL/L)   ??? CO2 27  21 - 32 (MMOL/L)   ??? Anion gap 9  5 - 15 (mmol/L)   ??? Glucose 192 (*) 65 - 100 (MG/DL)   ??? BUN 10  6 - 20 (MG/DL)   ??? Creatinine 1.0  0.6 - 1.3 (MG/DL)   ??? BUN/Creatinine ratio 10 (*)  12 - 20 ( )   ??? GFR est AA >60  >60 (ml/min/1.14m2)   ??? GFR est non-AA >60  >60 (ml/min/1.35m2)   ??? Calcium 8.8  8.5 - 10.1 (MG/DL)   METABOLIC PANEL, BASIC    Collection Time    05/01/09  3:20 AM   Component Value Range   ??? Sodium 138  136 - 145 (MMOL/L)   ??? Potassium 3.1 (*) 3.5 - 5.1 (MMOL/L)   ??? Chloride 105  97 - 108 (MMOL/L)   ??? CO2 27  21 - 32 (MMOL/L)   ??? Anion gap 6  5 - 15 (mmol/L)   ??? Glucose 102 (*) 65 - 100 (MG/DL)   ??? BUN 11  6 - 20 (MG/DL)   ??? Creatinine 0.9  0.6 - 1.3 (MG/DL)   ??? BUN/Creatinine ratio 12  12 - 20 ( )   ??? GFR est AA >60  >60 (ml/min/1.55m2)   ??? GFR est non-AA >60  >60 (ml/min/1.36m2)   ??? Calcium 8.8  8.5 - 10.1 (MG/DL)   HEPATIC FUNCTION PANEL    Collection Time    05/01/09  3:20 AM   Component Value Range   ??? Protein, total 6.8  6.4 - 8.2 (g/dL)   ??? Albumin 3.1 (*) 3.5 - 5.0 (g/dL)   ??? Globulin 3.7  2.0 - 4.0 (g/dL)   ??? A-G Ratio 0.8 (*) 1.1 - 2.2 ( )   ??? Bilirubin, total 4.4 (*) 0.2 - 1.0 (MG/DL)   ??? Bilirubin, direct 2.1 (*) 0.0 - 0.2 (MG/DL)   ??? Alk. phosphatase 200 (*) 50 - 136 (U/L)   ??? AST 52 (*) 15 - 37 (U/L)   ??? ALT 29  12 - 78 (U/L)   AMMONIA    Collection Time    05/01/09  3:20 AM   Component Value Range   ??? Ammonia 67 (*) <32 (UMOL/L)    CBC W/O DIFF    Collection Time    05/01/09  3:20 AM   Component Value Range   ??? WBC 11.5 (*) 3.6 - 11.0 (K/uL)   ??? RBC 3.56 (*) 3.80 - 5.20 (M/uL)   ??? HGB 9.4 (*) 11.5 - 16.0 (g/dL)   ??? HCT 29.5 (*) 35.0 - 47.0 (%)   ??? MCV 82.9  80.0 - 99.0 (FL)   ??? MCH 26.4  26.0 - 34.0 (PG)   ??? MCHC 31.9  30.0 - 36.5 (g/dL)   ??? RDW 16.0 (*) 11.5 - 14.5 (%)   ??? PLATELET 92 (*) 150 - 400 (K/uL)

## 2009-05-01 NOTE — Progress Notes (Signed)
Pt remains confused and inappropriate in conversation and actions. Sitter remains at bedside for pt safety.

## 2009-05-01 NOTE — Progress Notes (Signed)
Pt experienced watery brown stool x2, potassium pills noted in stool. Pt complains of leg cramps. Tele-NSR-sinus tach. Bp 127/60, hr 96-103. Pt increasingly agitated, restless. A&Ox4 with confusion, aphasic. MD notified (Dr. Skeet Latch) for possible re-assessment.

## 2009-05-01 NOTE — Progress Notes (Signed)
Notified Dr. Laural Benes per Dr. Tamala Ser Order to call if K less the 3.2. Received order will continue to monitor. [facility] RN

## 2009-05-02 LAB — METABOLIC PANEL, BASIC
Anion gap: 12 mmol/L (ref 5–15)
BUN/Creatinine ratio: 12 (ref 12–20)
BUN: 13 MG/DL (ref 6–20)
CO2: 22 MMOL/L (ref 21–32)
Calcium: 9.7 MG/DL (ref 8.5–10.1)
Chloride: 108 MMOL/L (ref 97–108)
Creatinine: 1.1 MG/DL (ref 0.6–1.3)
GFR est AA: >60 mL/min/{1.73_m2} (ref >60)
GFR est non-AA: 56 mL/min/{1.73_m2} — ABNORMAL LOW (ref >60)
Glucose: 87 MG/DL (ref 65–100)
Potassium: 4.8 MMOL/L (ref 3.5–5.1)
Sodium: 142 MMOL/L (ref 136–145)

## 2009-05-02 LAB — CBC WITH AUTOMATED DIFF
ABS. BASOPHILS: 0 10*3/uL (ref 0.0–0.1)
ABS. EOSINOPHILS: 0.2 10*3/uL (ref 0.0–0.4)
ABS. LYMPHOCYTES: 1.2 10*3/uL (ref 0.8–3.5)
ABS. MONOCYTES: 1 10*3/uL (ref 0.0–1.0)
ABS. NEUTROPHILS: 8.3 10*3/uL — ABNORMAL HIGH (ref 1.8–8.0)
BASOPHILS: 0 % (ref 0–1)
EOSINOPHILS: 2 % (ref 0–7)
HCT: 29.6 % — ABNORMAL LOW (ref 35.0–47.0)
HGB: 9.2 g/dL — ABNORMAL LOW (ref 11.5–16.0)
LYMPHOCYTES: 12 % (ref 12–49)
MCH: 26.2 PG (ref 26.0–34.0)
MCHC: 31.1 g/dL (ref 30.0–36.5)
MCV: 84.3 FL (ref 80.0–99.0)
MONOCYTES: 9 % (ref 5–13)
NEUTROPHILS: 77 % — ABNORMAL HIGH (ref 32–75)
PLATELET: 104 10*3/uL — ABNORMAL LOW (ref 150–400)
RBC: 3.51 M/uL — ABNORMAL LOW (ref 3.80–5.20)
RDW: 16.6 % — ABNORMAL HIGH (ref 11.5–14.5)
WBC: 10.7 10*3/uL (ref 3.6–11.0)

## 2009-05-02 LAB — VITAMIN B12 & FOLATE
Folate: 7.5 ng/mL (ref 5.0–21.0)
Vitamin B12: 1287 pg/mL (ref 254–1320)

## 2009-05-02 LAB — AMMONIA: Ammonia, plasma: 28 umol/L (ref <32)

## 2009-05-02 LAB — MAGNESIUM: Magnesium: 1.9 MG/DL (ref 1.6–2.4)

## 2009-05-02 MED ADMIN — levofloxacin (LEVAQUIN) 750 mg infusion: INTRAVENOUS | @ 21:00:00 | NDC 50458016601

## 2009-05-02 MED ADMIN — saline peripheral flush 5 mL: @ 09:00:00 | NDC 87701099893

## 2009-05-02 MED ADMIN — lorazepam (ATIVAN) tablet 1 mg: ORAL | @ 03:00:00 | NDC 51079038601

## 2009-05-02 MED ADMIN — spironolactone (ALDACTONE) tablet 50 mg: ORAL | @ 14:00:00 | NDC 51079010301

## 2009-05-02 MED ADMIN — metronidazole (FLAGYL) IVPB 500 mg: INTRAVENOUS | @ 20:00:00 | NDC 00338105548

## 2009-05-02 MED ADMIN — potassium chloride SR (KLOR-CON 10) tablet 40 mEq: ORAL | @ 03:00:00 | NDC 00074780419

## 2009-05-02 MED ADMIN — pantoprazole (PROTONIX) tablet 40 mg: ORAL | @ 14:00:00 | NDC 00008084181

## 2009-05-02 MED ADMIN — SALINE PERIPHERAL FLUSH Q8H 5 mL: @ 18:00:00 | NDC 58016499501

## 2009-05-02 MED ADMIN — HYDROmorphone (PF) (DILAUDID) injection 1 mg: INTRAVENOUS | @ 09:00:00 | NDC 00409255201

## 2009-05-02 MED ADMIN — lorazepam (ATIVAN) tablet 1 mg: ORAL | @ 09:00:00 | NDC 51079038601

## 2009-05-02 MED ADMIN — lorazepam (ATIVAN) injection 1 mg: INTRAVENOUS | @ 22:00:00 | NDC 10019010239

## 2009-05-02 MED ADMIN — SALINE PERIPHERAL FLUSH Q8H 5 mL: @ 03:00:00 | NDC 87701099893

## 2009-05-02 MED ADMIN — haloperidol lactate (HALDOL) injection 1 mg: INTRAVENOUS | NDC 63323047401

## 2009-05-02 MED ADMIN — lactulose (CHRONULAC) solution 20 g: ORAL | @ 14:00:00 | NDC 00121457730

## 2009-05-02 MED ADMIN — acetaminophen (TYLENOL) suppository 650 mg: RECTAL | NDC 45802073032

## 2009-05-02 MED ADMIN — enoxaparin (LOVENOX) injection 40 mg: SUBCUTANEOUS | @ 14:00:00 | NDC 00075062040

## 2009-05-02 MED ADMIN — HYDROmorphone (PF) (DILAUDID) injection 0.5 mg: INTRAVENOUS | NDC 00409128331

## 2009-05-02 MED ADMIN — lorazepam (ATIVAN) 2 mg/mL injection: @ 16:00:00 | NDC 10019010239

## 2009-05-02 MED ADMIN — SALINE PERIPHERAL FLUSH Q8H 5 mL: @ 10:00:00 | NDC 58016499501

## 2009-05-02 MED ADMIN — sertraline (ZOLOFT) tablet 100 mg: ORAL | @ 14:00:00 | NDC 00904608861

## 2009-05-02 MED FILL — HALOPERIDOL 2 MG TAB: 2 mg | ORAL | Qty: 1

## 2009-05-02 MED FILL — LORAZEPAM 2 MG/ML IJ SOLN: 2 mg/mL | INTRAMUSCULAR | Qty: 1

## 2009-05-02 MED FILL — HYDROMORPHONE (PF) 1 MG/ML IJ SOLN: 1 mg/mL | INTRAMUSCULAR | Qty: 1

## 2009-05-02 MED FILL — LACTULOSE 10 GRAM/15 ML ORAL SOLN: 10 gram/15 mL | ORAL | Qty: 30

## 2009-05-02 MED FILL — K-TAB 10 MEQ TABLET,EXTENDED RELEASE: 10 mEq | ORAL | Qty: 4

## 2009-05-02 MED FILL — HALOPERIDOL LACTATE 5 MG/ML IJ SOLN: 5 mg/mL | INTRAMUSCULAR | Qty: 1

## 2009-05-02 MED FILL — SERTRALINE 50 MG TAB: 50 mg | ORAL | Qty: 2

## 2009-05-02 MED FILL — LORAZEPAM 1 MG TAB: 1 mg | ORAL | Qty: 1

## 2009-05-02 MED FILL — LEVAQUIN 750 MG/150 ML IN 5 % DEXTROSE INTRAVENOUS PIGGYBACK: 750 mg/150 mL | INTRAVENOUS | Qty: 150

## 2009-05-02 MED FILL — SALINE FLUSH INJECTION SYRINGE: INTRAMUSCULAR | Qty: 10

## 2009-05-02 MED FILL — SPIRONOLACTONE 25 MG TAB: 25 mg | ORAL | Qty: 2

## 2009-05-02 MED FILL — LOVENOX 40 MG/0.4 ML SUBCUTANEOUS SYRINGE: 40 mg/0.4 mL | SUBCUTANEOUS | Qty: 1

## 2009-05-02 MED FILL — ACEPHEN 650 MG RECTAL SUPPOSITORY: 650 mg | RECTAL | Qty: 1

## 2009-05-02 MED FILL — PROTONIX 40 MG TABLET,DELAYED RELEASE: 40 mg | ORAL | Qty: 1

## 2009-05-02 MED FILL — METRONIDAZOLE IN SODIUM CHLORIDE (ISO-OSM) 500 MG/100 ML IV PIGGY BACK: 500 mg/100 mL | INTRAVENOUS | Qty: 100

## 2009-05-02 NOTE — Progress Notes (Signed)
 Formatting of this note might be different from the original.  Pt restless, kicking, and trying to get out of bed. Pt flailing back in bed almost hitting head on side rail. Ativan given @ 1800. Pt pulled IV out @ 1815. MD paged and orders received to start 4 point restraints and posey vest with sitter. MD stated to put left wrist restraint on upper arm due to broken left wrist.  Electronically signed by Catharine Lorrayne ORN, RN at 05/02/2009  7:47 PM EDT

## 2009-05-02 NOTE — Progress Notes (Signed)
 Formatting of this note might be different from the original.  Ortho-    Pt sedated with Ativan, but still thrashing around in bed.  Head CT apparently scheduled today, but pt sent back due to lack of cooperation.  Fingers are warm, not swollen.    Imp:  Distal radius / ulna fracture.    Plan:  To OR when / if pt's mental status improves.    Lupita NOVAK. Boyd, MD      Electronically signed by Boyd Lupita NOVAK, MD at 05/02/2009  2:21 PM EDT

## 2009-05-02 NOTE — Progress Notes (Signed)
 Formatting of this note is different from the original.  TRANSFER - OUT REPORT:    Verbal report given toTraci, RN on [name]  being transferred to Oncology for routine progression of care       Report consisted of patient?s Situation, Background, Assessment and   Recommendations(SBAR).     Information from the following report(s) SBAR, Kardex, Intake/Output, MAR, Accordion and Recent Results was reviewed with the receiving nurse.    Opportunity for questions and clarification was provided.        Electronically signed by Catharine Lorrayne ORN, RN at 05/02/2009  7:59 PM EDT

## 2009-05-02 NOTE — Progress Notes (Signed)
 Formatting of this note might be different from the original.  Pt downstairs in stretcher to CT and Xray. Sitter went with pt. Tele D/Cd per MD order. Pt continuously pulls off leads.  Electronically signed by Catharine Lorrayne ORN, RN at 05/02/2009 12:24 PM EDT

## 2009-05-02 NOTE — Progress Notes (Signed)
 Formatting of this note might be different from the original.  Pt restless and agitated. Pt shakes head when asked if in pain. Haldol and dilaudid  given. Wil transfer pt to oncology per MD orders  Electronically signed by Catharine Lorrayne ORN, RN at 05/02/2009  8:00 PM EDT

## 2009-05-02 NOTE — Progress Notes (Signed)
 Formatting of this note might be different from the original.  Spoke with pt's sitter. Pt recently returned from imaging. Per sitter, pt agitated and recently fell asleep but still restless. Pt is not appropriate at this time for PT eval. Will f/u tomorrow. Thank you.   Electronically signed by Sammi Greig DEL, PT, DPT at 05/02/2009  2:19 PM EDT

## 2009-05-02 NOTE — Progress Notes (Signed)
 Formatting of this note is different from the original.  Hospitalist Progress Note    NAME:  Breanna Lester   DOB:   Jan 16, 1961   MRN:   [id number]     Date/Time:  05/02/2009 8:55 AM  Subjective:   Chief Complaint:  F/u hypokalemia, N/V, hepatic encephalopathy  Less responsive today, still trashing about the bed. Only admits to pain in  Left arm.  Still with diarrhea with lactulose  Spoke with husband, notes often gets agitated, confused with acute illness:   Common pattern in past  No alcohol in years per husband.CXR with ? Pneumonia, some cough and low grade fever today.    Review of Systems:  Y  N       Y  N  []         [x]          Fever/chills                                               []         [x]          Chest Pain  []         [x]          Cough                                                       [x]         []          Diarrhea with lactulose  []         [x]          Sputum                                                     []         [x]          Constipation  []         [x]          SOB/DOE                                                []         [x]          Nausea/Vomit  []         [x]          Abd Pain                                                    []         []          Tolerating PT  []         [x]          Dysuria                                                      []         []   Tolerating Diet     []        Unable to obtain  ROS due to  []        mental status change  []        sedated   []        intubated     Past Med History and Social history reviewed. No changes.   [x]        Current Medication list and allergies reviewed*    Objective:   Vitals:  BP 112/77  Pulse 104  Temp 98.6 F (37 C)  Resp 22  Ht 5' 2 (1.575 m)  Wt 162 lb 11.2 oz (73.8 kg)  SpO2 89%  Temp (24hrs), Avg:98.6 F (37 C), Min:98.4 F (36.9 C), Max:98.6 F (37 C)    Last 24hr Input/Output:  No intake or output data in the 24 hours ending 05/01/09 0851     PHYSICAL EXAM:  General:  Alert, constantly moving in bed, slow  but oriented  Head: Normocephalic, atraumatic.     PERRL, sclera non icteric    Oropharynx clear, no exudates.  Neck:  No meningismus No masses  Lungs:  No accessory muscle use or retractions     Clear BS bilaterally  Heart:    No JVD     Regular rate and rhythm, No murmurs No Gallops.     No LE edema  Abdomen: Soft, non-tender. Not distended. Bowel sounds normal.        LN:    No cervical or inguinal LN  Skin: No rashes  MS:  No joint swelling, erthema, warmth. No cyanosis/clubbing   Neurologic:  Alert, slow, not oriented today    CN intact    Motor exam nonfocal     [x]     Telemetry Reviewed     []     NSR []     PAC/PVCs   []     Afib  []     Paced   []     NSVT   []     Foley []     NGT  []     Intubated on vent     [x]       Reviewed most current laboratory data**   []       I have personally reviewed the   []     xray  []     CT scan    Assessment/Plan:     Patient Active Hospital Problem List:    Altered mental status likely metabolic encephalopathy   Ammonia and hypokalemia improved   ? underlying pneumonia, start empiric antibiotics   Check [place]  to rule out ascites, GB disease   Unable to cooperate for CT scan, moving all limbs well.    Pneumonia levaquin and flagyl    Nausea with vomiting (05/01/2009) resolved.    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    Hypomagnesemia (05/01/2009)    ___________________________________________________  Risk of deterioration:  []     Low    [x]     Moderate  []     High  __________________________________________    Total time spent with patient:  []     15   [x]     25   []     35   []      __ minutes    []     Critical Care Provided  Care Plan discussed with:    [x]     Patient   []      Family    []     Care Manager    [x]     Nursing   []     Consultant/Specialist :      []       >50% of visit spent in counseling and coordination of care   (Discussed []     CODE status,  []     Care Plan, []     D/C Planning)    Prophylaxis:  [x]     Lovenox   []     Coumadin  []     Hep SQ  []     SCD?s  [x]     H2B/PPI    Disposition:  [x]     Home w/ Family   []     HH PT,OT,RN   []     SNF/LTC   []     SAH/Rehab  ___________________________________________________    Hospitalist: Elsie MYRTIS Katheryne Mickey., MD     ___________________________________________________    *Medications reviewed:  Current facility-administered medications   Medication Dose Route Frequency   ? HYDROmorphone  (PF) (DILAUDID ) injection 0.5 mg  0.5 mg IntraVENous Q4H PRN   ? lorazepam (ATIVAN) injection 1 mg  1 mg IntraVENous Q6H PRN   ? levofloxacin (LEVAQUIN) 750 mg infusion   750 mg IntraVENous Q24H   ? metronidazole (FLAGYL) IVPB 500 mg  500 mg IntraVENous Q8H   ? potassium chloride  SR (KLOR-CON  10) tablet 40 mEq  40 mEq Oral TID   ? enoxaparin  (LOVENOX ) injection 40 mg  40 mg SubCUTAneous Q24H   ? lactulose (CHRONULAC) solution 20 g  20 g Oral DAILY   ? potassium chloride  10 mEq in 100 ml IVPB   10 mEq IntraVENous Q1H   ? sertraline  (ZOLOFT ) tablet 100 mg  100 mg Oral DAILY   ? SALINE PERIPHERAL FLUSH Q8H 5 mL  5 mL InterCATHeter Q8H   ? saline peripheral flush 5 mL  5 mL InterCATHeter PRN   ? prochlorperazine (COMPAZINE) injection 5 mg  5 mg IntraVENous Q6H PRN   ? DISCONTD: spironolactone (ALDACTONE) tablet 50 mg  50 mg Oral DAILY   ? DISCONTD: acetaminophen  (TYLENOL ) solution 650 mg  650 mg Oral Q4H PRN   ? DISCONTD: pantoprazole  (PROTONIX ) tablet 40 mg  40 mg Oral DAILY   ? DISCONTD: lorazepam (ATIVAN) tablet 1 mg  1 mg Oral Q6H PRN   ? DISCONTD: oxycodone  (ROXICODONE ) tablet 10 mg  10 mg Oral Q4H PRN   ? DISCONTD: HYDROmorphone  (PF) (DILAUDID ) injection 1 mg  1 mg IntraVENous Q4H PRN         **Lab Data Reviewed:  Recent Results (from the past 24  hour(s))   GLUCOSE, POC    Collection Time    05/01/09  4:40 PM   Component Value Range   ? POC GLUCOSE 114 (*) 65 - 105 (mg/dL)   METABOLIC PANEL, BASIC    Collection Time    05/02/09  3:45 AM   Component Value Range   ? Sodium 142  136 - 145 (MMOL/L)   ? Potassium 4.8  3.5 - 5.1 (MMOL/L)   ? Chloride 108  97 - 108 (MMOL/L)   ? CO2 22  21 - 32 (MMOL/L)   ? Anion gap 12  5 - 15 (mmol/L)   ? Glucose 87  65 - 100 (MG/DL)   ? BUN 13  6 - 20 (MG/DL)   ? Creatinine 1.1  0.6 - 1.3 (  MG/DL)   ? BUN/Creatinine ratio 12  12 - 20 ( )   ? GFR est AA >60  >60 (ml/min/1.40m2)   ? GFR est non-AA 56 (*) >60 (ml/min/1.9m2)   ? Calcium 9.7  8.5 - 10.1 (MG/DL)   MAGNESIUM     Collection Time    05/02/09  3:45 AM   Component Value Range   ? Magnesium  1.9  1.6 - 2.4 (MG/DL)   AMMONIA    Collection Time    05/02/09  3:45 AM   Component Value Range   ? Ammonia 28  <32 (UMOL/L)   CBC WITH AUTOMATED DIFF    Collection Time    05/02/09  3:45 AM   Component Value Range   ? WBC 10.7  3.6 - 11.0 (K/uL)   ? RBC 3.51 (*) 3.80 - 5.20 (M/uL)   ? HGB 9.2 (*) 11.5 - 16.0 (g/dL)   ? HCT 29.6 (*) 35.0 - 47.0 (%)   ? MCV 84.3  80.0 - 99.0 (FL)   ? MCH 26.2  26.0 - 34.0 (PG)   ? MCHC 31.1  30.0 - 36.5 (g/dL)   ? RDW 16.6 (*) 11.5 - 14.5 (%)   ? PLATELET 104 (*) 150 - 400 (K/uL)   ? NEUTROPHILS 77 (*) 32 - 75 (%)   ? LYMPHOCYTES 12  12 - 49 (%)   ? MONOCYTES 9  5 - 13 (%)   ? EOSINOPHILS 2  0 - 7 (%)   ? BASOPHILS 0  0 - 1 (%)   ? ABSOLUTE NEUTS 8.3 (*) 1.8 - 8.0 (K/UL)   ? ABSOLUTE LYMPHS 1.2  0.8 - 3.5 (K/UL)   ? ABSOLUTE MONOS 1.0  0.0 - 1.0 (K/UL)   ? ABSOLUTE EOSINS 0.2  0.0 - 0.4 (K/UL)   ? ABSOLUTE BASOS 0.0  0.0 - 0.1 (K/UL)   VITAMIN B12 & FOLATE    Collection Time    05/02/09  3:45 AM   Component Value Range   ? Vitamin B12 1287  254 - 1320 (pg/mL)   ? Folate 7.5  5.0 - 21.0 (ng/mL)       Electronically signed by Katheryne Teddie Elsie SHAUNNA, MD at 05/02/2009  2:39 PM EDT

## 2009-05-02 NOTE — Progress Notes (Signed)
Pt restless and agitated. Pt shakes head when asked if in pain. Haldol and dilaudid given. Wil transfer pt to oncology per MD orders

## 2009-05-02 NOTE — Progress Notes (Signed)
Spoke with Personnel officer. Pt recently returned from imaging. Per sitter, pt agitated and recently fell asleep but still restless. Pt is not appropriate at this time for PT eval. Will f/u tomorrow. Thank you.

## 2009-05-02 NOTE — Progress Notes (Signed)
Ortho-    Pt sedated with Ativan, but still thrashing around in bed.  Head CT apparently scheduled today, but pt sent back due to lack of cooperation.  Fingers are warm, not swollen.    Imp:  Distal radius / ulna fracture.    Plan:  To OR when / if pt's mental status improves.    Karie Georges. Janann August, MD

## 2009-05-02 NOTE — Progress Notes (Signed)
TRANSFER - OUT REPORT:    Verbal report given toTraci, RN on [name]  being transferred to Oncology for routine progression of care       Report consisted of patient???s Situation, Background, Assessment and   Recommendations(SBAR).     Information from the following report(s) SBAR, Kardex, Intake/Output, MAR, Accordion and Recent Results was reviewed with the receiving nurse.    Opportunity for questions and clarification was provided.

## 2009-05-02 NOTE — Progress Notes (Addendum)
Hospitalist Progress Note    NAME:  Breanna Lester   DOB:   1961/02/08   MRN:   [id number]     Date/Time:  05/02/2009 8:55 AM  Subjective:   Chief Complaint:  F/u hypokalemia, N/V, hepatic encephalopathy  Less responsive today, still trashing about the bed. Only admits to pain in  Left arm.  Still with diarrhea with lactulose  Spoke with husband, notes often gets agitated, confused with acute illness:   Common pattern in past  No alcohol in years per husband.CXR with ? Pneumonia, some cough and low grade fever today.      Review of Systems:  Y  N       Y  N  []         [x]          Fever/chills                                               []         [x]          Chest Pain  []         [x]          Cough                                                       [x]         []          Diarrhea with lactulose  []         [x]          Sputum                                                     []         [x]          Constipation  []         [x]          SOB/DOE                                                []         [x]          Nausea/Vomit  []         [x]          Abd Pain                                                    []         []          Tolerating PT  []         [x]          Dysuria                                                      []         []   Tolerating Diet     []        Unable to obtain  ROS due to  []        mental status change  []        sedated   []        intubated     Past Med History and Social history reviewed. No changes.   [x]        Current Medication list and allergies reviewed*    Objective:   Vitals:  BP 112/77   Pulse 104   Temp 98.6 ??F (37 ??C)   Resp 22   Ht 5\' 2"  (1.575 m)   Wt 162 lb 11.2 oz (73.8 kg)   SpO2 89%  Temp (24hrs), Avg:98.6 ??F (37 ??C), Min:98.4 ??F (36.9 ??C), Max:98.6 ??F (37 ??C)      Last 24hr Input/Output:  No intake or output data in the 24 hours ending 05/01/09 0851     PHYSICAL EXAM:  General:???? ???????????? Alert, constantly moving in bed, slow but oriented   Head:???? ??????????????????????Normocephalic, atraumatic.     PERRL, sclera non icteric ????????????????????????  ?????????????????????????? Oropharynx clear, no exudates.  Neck:  No meningismus No masses  Lungs:?? ???????????????????? No accessory muscle use or retractions     Clear BS bilaterally  Heart: ????????????????????   No JVD     Regular rate and rhythm,?? No murmurs No Gallops.     No LE edema  Abdomen:?? ????????Soft, non-tender. Not distended.?? Bowel sounds normal.        LN:    No cervical or inguinal LN  Skin:?????????? ??????????????????No rashes  MS:  No joint swelling, erthema, warmth. No cyanosis/clubbing   Neurologic:????????  Alert, slow, not oriented today    CN intact    Motor exam nonfocal     [x]     Telemetry Reviewed     []     NSR []     PAC/PVCs   []     Afib  []     Paced   []     NSVT   []     Foley []     NGT  []     Intubated on vent     [x]       Reviewed most current laboratory data**   []       I have personally reviewed the   []     xray  []     CT scan    Assessment/Plan:     Patient Active Hospital Problem List:    Altered mental status likely metabolic encephalopathy   Ammonia and hypokalemia improved   ? underlying pneumonia, start empiric antibiotics   Check [place] to rule out ascites, GB disease   Unable to cooperate for CT scan, moving all limbs well.    Pneumonia levaquin and flagyl    Nausea with vomiting (05/01/2009) resolved.    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    Hypomagnesemia (05/01/2009)    ___________________________________________________  Risk of deterioration:  []     Low    [x]     Moderate  []     High  __________________________________________    Total time spent with patient:  []     15   [x]     25   []     35   []      __ minutes    []   Critical Care Provided    Care Plan discussed with:     [x]     Patient   []     Family    []     Care Manager    [x]     Nursing   []     Consultant/Specialist :      []       >50% of visit spent in counseling and coordination of care   (Discussed []     CODE status,  []     Care Plan, []     D/C Planning)    Prophylaxis:  [x]     Lovenox  []     Coumadin  []     Hep SQ  []     SCD???s  [x]     H2B/PPI    Disposition:  [x]     Home w/ Family   []     HH PT,OT,RN   []     SNF/LTC   []     SAH/Rehab  ___________________________________________________    Hospitalist: Tawny Asal., MD     ___________________________________________________    *Medications reviewed:  Current facility-administered medications   Medication Dose Route Frequency   ??? HYDROmorphone (PF) (DILAUDID) injection 0.5 mg  0.5 mg IntraVENous Q4H PRN   ??? lorazepam (ATIVAN) injection 1 mg  1 mg IntraVENous Q6H PRN   ??? levofloxacin (LEVAQUIN) 750 mg infusion   750 mg IntraVENous Q24H   ??? metronidazole (FLAGYL) IVPB 500 mg  500 mg IntraVENous Q8H   ??? potassium chloride SR (KLOR-CON 10) tablet 40 mEq  40 mEq Oral TID   ??? enoxaparin (LOVENOX) injection 40 mg  40 mg SubCUTAneous Q24H   ??? lactulose (CHRONULAC) solution 20 g  20 g Oral DAILY   ??? potassium chloride 10 mEq in 100 ml IVPB   10 mEq IntraVENous Q1H   ??? sertraline (ZOLOFT) tablet 100 mg  100 mg Oral DAILY   ??? SALINE PERIPHERAL FLUSH Q8H 5 mL  5 mL InterCATHeter Q8H   ??? saline peripheral flush 5 mL  5 mL InterCATHeter PRN   ??? prochlorperazine (COMPAZINE) injection 5 mg  5 mg IntraVENous Q6H PRN   ??? DISCONTD: spironolactone (ALDACTONE) tablet 50 mg  50 mg Oral DAILY   ??? DISCONTD: acetaminophen (TYLENOL) solution 650 mg  650 mg Oral Q4H PRN   ??? DISCONTD: pantoprazole (PROTONIX) tablet 40 mg  40 mg Oral DAILY   ??? DISCONTD: lorazepam (ATIVAN) tablet 1 mg  1 mg Oral Q6H PRN   ??? DISCONTD: oxycodone (ROXICODONE) tablet 10 mg  10 mg Oral Q4H PRN   ??? DISCONTD: HYDROmorphone (PF) (DILAUDID) injection 1 mg  1 mg IntraVENous Q4H PRN            **Lab Data Reviewed:   Recent Results (from the past 24 hour(s))   GLUCOSE, POC    Collection Time    05/01/09  4:40 PM   Component Value Range   ??? POC GLUCOSE 114 (*) 65 - 105 (mg/dL)   METABOLIC PANEL, BASIC    Collection Time    05/02/09  3:45 AM   Component Value Range   ??? Sodium 142  136 - 145 (MMOL/L)   ??? Potassium 4.8  3.5 - 5.1 (MMOL/L)   ??? Chloride 108  97 - 108 (MMOL/L)   ??? CO2 22  21 - 32 (MMOL/L)   ??? Anion gap 12  5 - 15 (mmol/L)   ??? Glucose 87  65 - 100 (MG/DL)   ??? BUN 13  6 - 20 (MG/DL)   ???  Creatinine 1.1  0.6 - 1.3 (MG/DL)   ??? BUN/Creatinine ratio 12  12 - 20 ( )   ??? GFR est AA >60  >60 (ml/min/1.64m2)   ??? GFR est non-AA 56 (*) >60 (ml/min/1.51m2)   ??? Calcium 9.7  8.5 - 10.1 (MG/DL)   MAGNESIUM    Collection Time    05/02/09  3:45 AM   Component Value Range   ??? Magnesium 1.9  1.6 - 2.4 (MG/DL)   AMMONIA    Collection Time    05/02/09  3:45 AM   Component Value Range   ??? Ammonia 28  <32 (UMOL/L)   CBC WITH AUTOMATED DIFF    Collection Time    05/02/09  3:45 AM   Component Value Range   ??? WBC 10.7  3.6 - 11.0 (K/uL)   ??? RBC 3.51 (*) 3.80 - 5.20 (M/uL)   ??? HGB 9.2 (*) 11.5 - 16.0 (g/dL)   ??? HCT 29.6 (*) 35.0 - 47.0 (%)   ??? MCV 84.3  80.0 - 99.0 (FL)   ??? MCH 26.2  26.0 - 34.0 (PG)   ??? MCHC 31.1  30.0 - 36.5 (g/dL)   ??? RDW 16.6 (*) 11.5 - 14.5 (%)   ??? PLATELET 104 (*) 150 - 400 (K/uL)   ??? NEUTROPHILS 77 (*) 32 - 75 (%)   ??? LYMPHOCYTES 12  12 - 49 (%)   ??? MONOCYTES 9  5 - 13 (%)   ??? EOSINOPHILS 2  0 - 7 (%)   ??? BASOPHILS 0  0 - 1 (%)   ??? ABSOLUTE NEUTS 8.3 (*) 1.8 - 8.0 (K/UL)   ??? ABSOLUTE LYMPHS 1.2  0.8 - 3.5 (K/UL)   ??? ABSOLUTE MONOS 1.0  0.0 - 1.0 (K/UL)   ??? ABSOLUTE EOSINS 0.2  0.0 - 0.4 (K/UL)   ??? ABSOLUTE BASOS 0.0  0.0 - 0.1 (K/UL)   VITAMIN B12 & FOLATE    Collection Time    05/02/09  3:45 AM   Component Value Range   ??? Vitamin B12 1287  254 - 1320 (pg/mL)   ??? Folate 7.5  5.0 - 21.0 (ng/mL)

## 2009-05-02 NOTE — Progress Notes (Signed)
Pt downstairs in stretcher to CT and Xray. Sitter went with pt. Tele D/Cd per MD order. Pt continuously pulls off leads.

## 2009-05-02 NOTE — Progress Notes (Signed)
Pt restless, kicking, and trying to get out of bed. Pt flailing back in bed almost hitting head on side rail. Ativan given @ 1800. Pt pulled IV out @ 1815. MD paged and orders received to start 4 point restraints and posey vest with sitter. MD stated to put left wrist restraint on upper arm due to broken left wrist.

## 2009-05-03 LAB — LIPASE: Lipase: 59 U/L — ABNORMAL LOW (ref 73–393)

## 2009-05-03 LAB — CBC WITH MANUAL DIFF
ABS. BASOPHILS: 0 10*3/uL (ref 0.0–0.1)
ABS. EOSINOPHILS: 0.1 10*3/uL (ref 0.0–0.4)
ABS. LYMPHOCYTES: 1.4 10*3/uL (ref 0.8–3.5)
ABS. MONOCYTES: 0.5 10*3/uL (ref 0.0–1.0)
ABS. NEUTROPHILS: 10.4 10*3/uL — ABNORMAL HIGH (ref 1.8–8.0)
ABSOLUTE NRBC: 0 10*3/uL (ref 0.00–0.01)
BAND NEUTROPHILS: 1 % (ref 0–6)
BASOPHILS: 0 % (ref 0–1)
BLASTS: 0 %
EOSINOPHILS: 1 % (ref 0–7)
HCT: 30.3 % — ABNORMAL LOW (ref 35.0–47.0)
HGB: 9.2 g/dL — ABNORMAL LOW (ref 11.5–16.0)
LYMPHOCYTES: 11 % — ABNORMAL LOW (ref 12–49)
MCH: 26.1 PG (ref 26.0–34.0)
MCHC: 30.4 g/dL (ref 30.0–36.5)
MCV: 86.1 FL (ref 80.0–99.0)
METAMYELOCYTES: 0 %
MONOCYTES: 4 % — ABNORMAL LOW (ref 5–13)
MYELOCYTES: 0 %
NEUTROPHILS: 83 % — ABNORMAL HIGH (ref 32–75)
NRBC: 0 PER 100 WBC
OTHER CELL: 0
PLATELET: 123 10*3/uL — ABNORMAL LOW (ref 150–400)
PROMYELOCYTES: 0 %
RBC: 3.52 M/uL — ABNORMAL LOW (ref 3.80–5.20)
RDW: 17.2 % — ABNORMAL HIGH (ref 11.5–14.5)
WBC: 12.4 10*3/uL — ABNORMAL HIGH (ref 3.6–11.0)

## 2009-05-03 LAB — METABOLIC PANEL, COMPREHENSIVE
A-G Ratio: 0.9 — ABNORMAL LOW (ref 1.1–2.2)
ALT (SGPT): 61 U/L (ref 12–78)
AST (SGOT): 159 U/L — ABNORMAL HIGH (ref 15–37)
Albumin: 3.3 g/dL — ABNORMAL LOW (ref 3.5–5.0)
Alk. phosphatase: 197 U/L — ABNORMAL HIGH (ref 50–136)
Anion gap: 15 mmol/L (ref 5–15)
BUN/Creatinine ratio: 18 (ref 12–20)
BUN: 21 MG/DL — ABNORMAL HIGH (ref 6–20)
Bilirubin, total: 4.8 MG/DL — ABNORMAL HIGH (ref 0.2–1.0)
CO2: 22 MMOL/L (ref 21–32)
Calcium: 10 MG/DL (ref 8.5–10.1)
Chloride: 120 MMOL/L — ABNORMAL HIGH (ref 97–108)
Creatinine: 1.2 MG/DL (ref 0.6–1.3)
GFR est AA: >60 mL/min/{1.73_m2} (ref >60)
GFR est non-AA: 51 mL/min/{1.73_m2} — ABNORMAL LOW (ref >60)
Globulin: 3.5 g/dL (ref 2.0–4.0)
Glucose: 91 MG/DL (ref 65–100)
Potassium: 4.6 MMOL/L (ref 3.5–5.1)
Protein, total: 6.8 g/dL (ref 6.4–8.2)
Sodium: 157 MMOL/L — ABNORMAL HIGH (ref 136–145)

## 2009-05-03 LAB — BLOOD GAS ARTERIAL,VENT
BASE DEFICIT: 3.2 mmol/L — AB
BICARBONATE: 22 mmol/L (ref 22–26)
O2 FLOW RATE: 2.5 L/min
O2 SAT: 86 % — ABNORMAL LOW (ref 92–97)
PCO2: 39 mmHg (ref 35–45)
PO2: 53 mmHg — CL (ref 80–100)
SPONTANEOUS RATE: 38
pH: 7.37 (ref 7.35–7.45)

## 2009-05-03 LAB — METABOLIC PANEL, BASIC
Anion gap: 12 mmol/L (ref 5–15)
BUN/Creatinine ratio: 18 (ref 12–20)
BUN: 24 MG/DL — ABNORMAL HIGH (ref 6–20)
CO2: 22 MMOL/L (ref 21–32)
Calcium: 9.7 MG/DL (ref 8.5–10.1)
Chloride: 128 MMOL/L — ABNORMAL HIGH (ref 97–108)
Creatinine: 1.3 MG/DL (ref 0.6–1.3)
GFR est AA: 56 mL/min/{1.73_m2} — ABNORMAL LOW (ref >60)
GFR est non-AA: 46 mL/min/{1.73_m2} — ABNORMAL LOW (ref >60)
Glucose: 108 MG/DL — ABNORMAL HIGH (ref 65–100)
Potassium: 4.7 MMOL/L (ref 3.5–5.1)
Sodium: 162 MMOL/L — CR (ref 136–145)

## 2009-05-03 LAB — MAGNESIUM: Magnesium: 2.2 MG/DL (ref 1.6–2.4)

## 2009-05-03 LAB — CK W/ CKMB & INDEX
CK - MB: 26.4 NG/ML — ABNORMAL HIGH (ref 0.5–3.6)
CK-MB Index: 0.7 (ref 0–2.5)
CK: 3788 U/L — ABNORMAL HIGH (ref 21–215)

## 2009-05-03 LAB — TROPONIN I: Troponin-I, Qt.: 0.69 ng/mL — ABNORMAL HIGH (ref <0.05)

## 2009-05-03 MED ADMIN — haloperidol lactate (HALDOL) injection 1 mg: INTRAVENOUS | @ 02:00:00 | NDC 63323047401

## 2009-05-03 MED ADMIN — furosemide (LASIX) 10 mg/mL injection: NDC 00409610204

## 2009-05-03 MED ADMIN — lorazepam (ATIVAN) injection 1 mg: INTRAVENOUS | @ 12:00:00 | NDC 10019010239

## 2009-05-03 MED ADMIN — SALINE PERIPHERAL FLUSH Q8H 5 mL: @ 19:00:00 | NDC 87701099893

## 2009-05-03 MED ADMIN — sodium chloride 0.9 % injection: @ 15:00:00 | NDC 00409488810

## 2009-05-03 MED ADMIN — haloperidol lactate (HALDOL) injection 1 mg: INTRAVENOUS | @ 07:00:00 | NDC 63323047401

## 2009-05-03 MED ADMIN — haloperidol lactate (HALDOL) injection 1 mg: INTRAVENOUS | @ 15:00:00 | NDC 63323047401

## 2009-05-03 MED ADMIN — saline peripheral flush 5 mL: NDC 87701099893

## 2009-05-03 MED ADMIN — lorazepam (ATIVAN) 2 mg/mL injection: @ 20:00:00 | NDC 00409677802

## 2009-05-03 MED ADMIN — sodium chloride 0.9 % injection: @ 19:00:00 | NDC 00409488810

## 2009-05-03 MED ADMIN — SALINE PERIPHERAL FLUSH Q8H 5 mL: @ 02:00:00 | NDC 87701099893

## 2009-05-03 MED ADMIN — SALINE PERIPHERAL FLUSH Q8H 5 mL: @ 12:00:00 | NDC 87701099893

## 2009-05-03 MED ADMIN — dextrose 5% infusion: INTRAVENOUS | @ 19:00:00 | NDC 82468011674

## 2009-05-03 MED ADMIN — dextrose 5% infusion: INTRAVENOUS | NDC 00409792203

## 2009-05-03 MED ADMIN — metronidazole (FLAGYL) IVPB 500 mg: INTRAVENOUS | @ 13:00:00 | NDC 00338105548

## 2009-05-03 MED ADMIN — lorazepam (ATIVAN) injection 1 mg: INTRAVENOUS | @ 05:00:00 | NDC 10019010239

## 2009-05-03 MED ADMIN — acetaminophen (TYLENOL) suppository 650 mg: RECTAL | @ 13:00:00 | NDC 45802073032

## 2009-05-03 MED ADMIN — 0.45% sodium chloride infusion: INTRAVENOUS | @ 10:00:00 | NDC 00409798509

## 2009-05-03 MED ADMIN — metronidazole (FLAGYL) IVPB 500 mg: INTRAVENOUS | @ 04:00:00 | NDC 00338105548

## 2009-05-03 MED ADMIN — vancomycin (VANCOCIN) 1.75 g in 0.9% sodium chloride 500 mL IVPB: INTRAVENOUS | @ 22:00:00 | NDC 63323031461

## 2009-05-03 MED ADMIN — HYDROmorphone (PF) (DILAUDID) injection 0.5 mg: INTRAVENOUS | @ 09:00:00 | NDC 00409128331

## 2009-05-03 MED ADMIN — haloperidol lactate (HALDOL) injection 1 mg: INTRAVENOUS | @ 10:00:00 | NDC 63323047401

## 2009-05-03 MED ADMIN — HYDROmorphone (PF) (DILAUDID) injection 0.5 mg: INTRAVENOUS | @ 04:00:00 | NDC 00409128331

## 2009-05-03 MED ADMIN — enoxaparin (LOVENOX) injection 40 mg: SUBCUTANEOUS | @ 19:00:00 | NDC 00075062040

## 2009-05-03 MED ADMIN — haloperidol lactate (HALDOL) injection 1 mg: INTRAVENOUS | @ 08:00:00 | NDC 63323047401

## 2009-05-03 MED FILL — DEXTROSE 5% IN WATER (D5W) IV: INTRAVENOUS | Qty: 1000

## 2009-05-03 MED FILL — HYDROMORPHONE (PF) 1 MG/ML IJ SOLN: 1 mg/mL | INTRAMUSCULAR | Qty: 1

## 2009-05-03 MED FILL — SODIUM CHLORIDE 0.9 % INJECTION: INTRAMUSCULAR | Qty: 10

## 2009-05-03 MED FILL — THIAMINE 100 MG/ML INJECTION: 100 mg/mL | INTRAMUSCULAR | Qty: 2

## 2009-05-03 MED FILL — VANCOMYCIN 10 GRAM IV SOLR: 10 gram | INTRAVENOUS | Qty: 1.75

## 2009-05-03 MED FILL — SODIUM CHLORIDE 0.45 % IV: 0.45 % | INTRAVENOUS | Qty: 1000

## 2009-05-03 MED FILL — LORAZEPAM 2 MG/ML IJ SOLN: 2 mg/mL | INTRAMUSCULAR | Qty: 1

## 2009-05-03 MED FILL — SALINE FLUSH INJECTION SYRINGE: INTRAMUSCULAR | Qty: 20

## 2009-05-03 MED FILL — SERTRALINE 50 MG TAB: 50 mg | ORAL | Qty: 2

## 2009-05-03 MED FILL — SALINE FLUSH INJECTION SYRINGE: INTRAMUSCULAR | Qty: 10

## 2009-05-03 MED FILL — HALOPERIDOL LACTATE 5 MG/ML IJ SOLN: 5 mg/mL | INTRAMUSCULAR | Qty: 1

## 2009-05-03 MED FILL — METRONIDAZOLE IN SODIUM CHLORIDE (ISO-OSM) 500 MG/100 ML IV PIGGY BACK: 500 mg/100 mL | INTRAVENOUS | Qty: 100

## 2009-05-03 MED FILL — LOVENOX 40 MG/0.4 ML SUBCUTANEOUS SYRINGE: 40 mg/0.4 mL | SUBCUTANEOUS | Qty: 1

## 2009-05-03 MED FILL — KETOROLAC TROMETHAMINE 30 MG/ML INJECTION: 30 mg/mL (1 mL) | INTRAMUSCULAR | Qty: 1

## 2009-05-03 MED FILL — LACTULOSE 10 GRAM/15 ML ORAL SOLN: 10 gram/15 mL | ORAL | Qty: 30

## 2009-05-03 MED FILL — FUROSEMIDE 10 MG/ML IJ SOLN: 10 mg/mL | INTRAMUSCULAR | Qty: 4

## 2009-05-03 MED FILL — THIAMINE 100 MG/ML INJECTION: 100 mg/mL | INTRAMUSCULAR | Qty: 1

## 2009-05-03 MED FILL — ACEPHEN 650 MG RECTAL SUPPOSITORY: 650 mg | RECTAL | Qty: 1

## 2009-05-03 MED FILL — IPRATROPIUM BROMIDE 0.02 % SOLN FOR INHALATION: 0.02 % | RESPIRATORY_TRACT | Qty: 2.5

## 2009-05-03 MED FILL — ALBUTEROL SULFATE 0.083 % (0.83 MG/ML) SOLN FOR INHALATION: 2.5 mg /3 mL (0.083 %) | RESPIRATORY_TRACT | Qty: 1

## 2009-05-03 NOTE — Progress Notes (Signed)
 Formatting of this note is different from the original.  Hospitalist Progress Note    NAME:  Breanna Lester   DOB:   11-11-1960   MRN:   [id number]     Date/Time:  05/03/2009 8:55 AM  Subjective:   Chief Complaint:  F/u hypokalemia, N/V, hepatic encephalopathy  Still thrashing about the bed, non verbal today  Much more tachypneic today, hypoxic today(does wear home O2 at night, was heavy smoker per husband)  Spoke with husband, notes often gets agitated, confused with acute illness:  No alcohol in years per husband.CXR with ? Pneumonia, some cough and low grade fever today.  Full code    Review of Systems:  Y   N        Y   N  []            []             Fever/chills                                               []            []             Chest Pain  []            []             Cough                                                       []            []             Diarrhea with lactulose  []            []             Sputum                                                     []            []             Constipation  []            []             SOB/DOE                                                []            []             Nausea/Vomit  []            []             Abd Pain                                                    []            []   Tolerating PT  []            []             Dysuria                                                      []            []             Tolerating Diet     [x]           Unable to obtain  ROS due to  [x]       mental status change  []       sedated   []       intubated     Past Med History and Social history reviewed. No changes.   [x]           Current Medication list and allergies reviewed*    Objective:   Vitals:  BP 112/77  Pulse 104  Temp 98.6 F (37 C)  Resp 22  Ht 5' 2 (1.575 m)  Wt 162 lb 11.2 oz (73.8 kg)  SpO2 89%  Temp (24hrs), Avg:98.6 F (37 C), Min:98.4 F (36.9 C), Max:98.6 F (37 C)    Last 24hr Input/Output:  No intake or output data in the 24 hours ending 05/01/09  0851     PHYSICAL EXAM:  General:  Alert, constantly moving in bed, nonverbal  Head: Normocephalic, atraumatic.     PERRL, sclera non icteric    Oropharynx clear, no exudates.  Neck:  No meningismus No masses  Lungs:  No accessory muscle use or retractions     Rhonchi bilaterally  Heart:    No JVD     Regular rate and rhythm, No murmurs No gallops.     No LE edema  Abdomen: Soft, non-tender. Not distended. Bowel sounds normal.        LN:    No cervical or inguinal LN  Skin: No rashes  MS:  No joint swelling, erthema, warmth. No cyanosis/clubbing   Neurologic:  Alert, slow, not oriented today    CN intact    Motor exam nonfocal     [x]        Telemetry Reviewed     []        NSR []        PAC/PVCs   []        Afib  []        Paced   []        NSVT   []        Foley []        NGT  []        Intubated on vent     [x]          Reviewed most current laboratory data**   []          I have personally reviewed the   []        xray  []        CT scan    US  with no GB changes, no ascites    KUB with no obstruction.    ABG 7.36/38/53 on 2 liters    Assessment/Plan:     Patient Active Hospital Problem List:    Sepsis syndrome likely underlying pneumonia, fever, leukocytosis   Broad spectrum antibiotics.   No  evidence of UTI or ascites(SBP)    Acute respiratory failure O2, antibiotics, check CXR, transfer to stepdown bed.   Nebs    Altered mental status likely metabolic encephalopathy, hypernatremia.   IVF, antibiotics, d/c all sedatives, PR lactulose   Ammonia and hypokalemia improved   Unable to cooperate for CT scan yesterday, will try again today.   Moving all limbs well    Hypernatremia IV free water     Pneumonia levaquin and zosyn, titrate O2.    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    PICC line if it would be safe to place with agitation    ___________________________________________________  Risk of deterioration:  []        Low    []        Moderate  [x]        High  __________________________________________    Total time spent with patient:  []        15   [x]        25   []        35   []         __ minutes    []        Critical Care Provided    Care Plan discussed with:    [x]        Patient   []        Family    []        Care Manager    [x]        Nursing   []        Consultant/Specialist :      []          >50% of visit spent in counseling and coordination of care   (Discussed []        CODE status,  []        Care Plan, []        D/C Planning)    Prophylaxis:  [x]        Lovenox   []        Coumadin  []        Hep SQ  []        SCD?s  [x]        H2B/PPI    Disposition:  [x]        Home w/ Family   [x]        HH PT,OT,RN   []        SNF/LTC   []        SAH/Rehab  ___________________________________________________    Hospitalist: Elsie MYRTIS Katheryne Mickey., MD     ___________________________________________________    *Medications reviewed:  Current facility-administered medications   Medication Dose Route Frequency   ? sodium chloride  0.9 % injection        ? piperacillin-tazobactam (ZOSYN) 3.375 g in 0.9% sodium chloride  (MBP/ADV) 100 mL MBP  3.375 g IntraVENous Q6H   ? dextrose 5% infusion    IntraVENous CONTINUOUS   ? DISCONTD: 0.45% sodium chloride  infusion    IntraVENous CONTINUOUS   ? levofloxacin (  LEVAQUIN) 750 mg infusion   750 mg IntraVENous Q24H   ? DISCONTD: HYDROmorphone  (PF) (DILAUDID ) injection 0.5 mg  0.5 mg IntraVENous Q4H PRN   ? DISCONTD: lorazepam  (ATIVAN) injection 1 mg  1 mg IntraVENous Q6H PRN   ? DISCONTD: metronidazole (FLAGYL) IVPB 500 mg  500 mg IntraVENous Q8H   ? DISCONTD: haloperidol (HALDOL) tablet 1 mg  1 mg Oral Q1H PRN   ? DISCONTD: haloperidol lactate (HALDOL) injection 1 mg  1 mg IntraVENous Q1H PRN   ? DISCONTD: acetaminophen  (TYLENOL ) suppository 650 mg  650 mg Rectal Q6H PRN   ? DISCONTD: HYDROmorphone  (PF) (DILAUDID ) injection 0.5 mg  0.5 mg IntraVENous Q4H PRN   ? DISCONTD: HYDROmorphone  (PF) (DILAUDID ) injection 0.5 mg  0.5 mg IntraVENous Q4H PRN   ? enoxaparin  (LOVENOX ) injection 40 mg  40 mg SubCUTAneous Q24H   ? DISCONTD: lactulose (CHRONULAC) solution 20 g  20 g Oral DAILY   ? SALINE PERIPHERAL FLUSH Q8H 5 mL  5 mL InterCATHeter Q8H   ? saline peripheral flush 5 mL  5 mL InterCATHeter PRN   ? DISCONTD: sertraline  (ZOLOFT ) tablet 100 mg  100 mg Oral DAILY   ? DISCONTD: prochlorperazine (COMPAZINE) injection 5 mg  5 mg IntraVENous Q6H PRN         **Lab Data Reviewed:  Recent Results (from the past 24 hour(s))   CBC WITH MANUAL DIFF    Collection Time    05/03/09  2:55 AM   Component Value Range   ? WBC 12.4 (*) 3.6 - 11.0 (K/uL)   ? RBC 3.52 (*) 3.80 - 5.20 (M/uL)   ? HGB 9.2 (*) 11.5 - 16.0 (g/dL)   ? HCT 30.3 (*) 35.0 - 47.0 (%)   ? MCV 86.1  80.0 - 99.0 (FL)   ? MCH 26.1  26.0 - 34.0 (PG)   ? MCHC 30.4  30.0 - 36.5 (g/dL)   ? RDW 17.2 (*) 11.5 - 14.5 (%)   ? PLATELET 123 (*) 150 - 400 (K/uL)   ? NEUTROPHILS 83 (*) 32 - 75 (%)   ? BANDS 1  0 - 6 (%)   ? LYMPHOCYTES 11 (*) 12 - 49 (%)   ? MONOCYTES 4 (*) 5 - 13 (%)   ? EOSINOPHILS 1  0 - 7 (%)   ? BASOPHILS 0  0 - 1 (%)   ? METAMYELOCYTES 0  (%)   ? MYELOCYTES 0  0 (%)   ? PROMYELOCYTES 0  0 (%)   ? BLASTS 0  0 (%)   ? OTHER CELL 0  0 ( )   ? ABSOLUTE NEUTS 10.4 (*) 1.8 - 8.0 (K/UL)   ? ABSOLUTE LYMPHS 1.4  0.8 - 3.5 (K/UL)   ? ABSOLUTE MONOS 0.5  0.0 - 1.0 (K/UL)   ? ABSOLUTE EOSINS 0.1  0.0 - 0.4 (K/UL)   ? ABSOLUTE BASOS 0.0  0.0 - 0.1 (K/UL)   ? RBC COMMENTS NORMOCYTIC,  NORMOCHROMIC     ? DF MANUAL     ? NRBC 0.0  0 (PER 100 WBC)   ? ABSOLUTE NRBC 0.00  0.00 - 0.01 (K/uL)   METABOLIC PANEL, COMPREHENSIVE    Collection Time    05/03/09  2:55 AM   Component Value Range   ? Sodium 157 (*) 136 - 145 (MMOL/L)   ? Potassium 4.6  3.5 - 5.1 (MMOL/L)   ? Chloride 120 (*) 97 - 108 (MMOL/L)   ? CO2 22  21 - 32 (MMOL/L)   ? Anion gap 15  5 - 15 (mmol/L)   ? Glucose 91  65 - 100 (MG/DL)   ? BUN 21 (*) 6 - 20 (MG/DL)   ? Creatinine 1.2  0.6 - 1.3 (MG/DL)   ? BUN/Creatinine ratio 18  12 - 20 ( )   ? GFR est AA >60  >60 (ml/min/1.17m2)   ? GFR est non-AA 51 (*) >60 (ml/min/1.32m2)   ? Calcium 10.0  8.5 - 10.1 (MG/DL)   ? Bilirubin, total 4.8 (*) 0.2 - 1.0 (MG/DL)   ? ALT 61  12 - 78 (U/L)   ? AST 159 (*) 15 - 37 (U/L)   ? Alk. phosphatase 197 (*) 50 - 136 (U/L)   ? Protein, total 6.8  6.4 - 8.2 (g/dL)   ? Albumin 3.3 (*) 3.5 - 5.0 (g/dL)   ? Globulin 3.5  2.0 - 4.0 (g/dL)   ? A-G Ratio 0.9 (*) 1.1 - 2.2 ( )   LIPASE    Collection Time    05/03/09  2:55 AM   Component Value Range   ? Lipase 59 (*) 73 - 393 (U/L)   MAGNESIUM     Collection Time    05/03/09  2:55 AM   Component Value Range   ? Magnesium  2.2  1.6 - 2.4 (MG/DL)   BLOOD GAS, ARTERIAL    Collection Time    05/03/09  2:25 PM   Component Value Range   ? pH 7.37  7.35 - 7.45 ( )   ? PCO2 39  35 - 45 (mmHg)   ? PO2 53 (*) 80 - 100 (mmHg)   ? O2 SAT 86 (*) 92 - 97 (%)   ? BICARBONATE 22  22 - 26 (mmol/L)   ? BASE DEFICIT 3.2 (*) BE NORMAL RANGE -3 T (mmol/L)   ? O2 FLOW RATE 2.5  (L/min)   ? SITE RB     ? SPONTANEOUS RATE 38  ( )   ? SAMPLE SOURCE ARTERIAL     ? O2 METHOD NASAL O2         Electronically signed by Katheryne Teddie Elsie SHAUNNA, MD at 05/03/2009  2:47 PM EDT

## 2009-05-03 NOTE — Progress Notes (Signed)
 Formatting of this note might be different from the original.  Hospitalist    Patient transferred to stepdown, still with fevers and tachycardia.  CXR with worsening ASD, head CT negative  CPK elevated c/w rhabdomyalysis. Elevated troponin likely nonspecific, will check EKG.  Reviewed records prior history of MRSA pneumonia and respiratory failure requiring intubation.   Vanco/zosyn/levaquin.  Na Increased, will change to d5W at 200 cc hour to get free water  in, recheck labs in am.  Lactulose enemas.  Hold sedativies, no haldol with elevated CPK.  Titrate O2  Nebs, may require ICU and intubation if continues to decline.    Breanna MYRTIS Katheryne Mickey., MD      Electronically signed by Katheryne Teddie Breanna SHAUNNA, MD at 05/03/2009  5:58 PM EDT

## 2009-05-03 NOTE — Progress Notes (Signed)
 Formatting of this note might be different from the original.  Dr Katheryne in to see pt--orders rec. Will monitor pt  Electronically signed by Olene Inocente BRAVO, RN at 05/03/2009 11:09 PM EDT

## 2009-05-03 NOTE — Progress Notes (Signed)
 Formatting of this note might be different from the original.  Patient transported by stretcher to Ultrasound.  Medicated with 1 mg IV push lorazepam.  Tolerated without complications.  Patient left unit at 08:00  Electronically signed by Chandra Charleston B at 05/03/2009  9:16 AM EDT

## 2009-05-03 NOTE — Progress Notes (Signed)
 Formatting of this note might be different from the original.  Hospitalist    Patient transferred to stepdown, still with fevers and tachycardia.  CXR with worsening ASD  CPK elevated c/w rhabdomyalysis. Elevated troponin likely nonspecific, will check EKG.  Reviewed records prior history of MRSA pneumonia and respiratory failure requiring intubation.   Vanco/zosyn/levaquin.  Na Increased, will change to d5W at 200 cc hour to get free water  in, recheck labs in am.  Lactulose enemas.  Hold sedativies, no haldol with elevated CPK.  Titrate O2  Nebs, may require ICU and intubation if continues to decline.    Breanna Lester., MD      Electronically signed by Katheryne Teddie Breanna SHAUNNA, MD at 05/03/2009  5:58 PM EDT

## 2009-05-03 NOTE — Progress Notes (Signed)
 Formatting of this note might be different from the original.  Physical Therapy    Attempting this am to see patient for Physical Therapy initial evaluation. Patient remains agitated and trashing in bed; restraints in place and sitter present. Patient remains inappropriate for PT today.  Will follow back tomorrow.    Dorothe JONELLE Furbish, PT  Electronically signed by [place]urbish Dorothe JONELLE, PT at 05/03/2009 10:07 AM EDT

## 2009-05-03 NOTE — Progress Notes (Signed)
 Formatting of this note might be different from the original.  Updated Dr Katheryne of pt condition, increase temp to 103.3 axillary, hr still up 130-140s and resp still up 30-40s. Orders received. Tylenol  supp given and icepacks placed in armpits. Awaiting toradol  order from Dr Katheryne. Will monitor  Electronically signed by Olene Inocente BRAVO, RN at 05/03/2009 11:11 PM EDT

## 2009-05-03 NOTE — Progress Notes (Signed)
 Formatting of this note might be different from the original.  Paged Dr. Katheryne to report vitals and initial assessment findings of increased BP, low o2 sat's and tachypnea. New orders to be received.  Electronically signed by Jenita Grayce BRAVO, RN at 05/03/2009  5:51 PM EDT

## 2009-05-03 NOTE — Progress Notes (Signed)
 Formatting of this note might be different from the original.  Occupational Therapy  Orders received and medical record reviewed.  Spoke with nursing earlier this a.m. who reports that pt has received sedative, is restrained and is not following commands at this time.   Read recent PT note and agree that pt is not appropriate for initial OT evaluation at this time.   Will follow up tomorrow.  Electronically signed by Royal Elvie RAMAN, OTR/L at 05/03/2009 11:04 AM EDT

## 2009-05-03 NOTE — Progress Notes (Signed)
 Formatting of this note is different from the original.  TRANSFER - IN REPORT:    Verbal report received from Robert on Breanna Lester  being received from Oncology for urgent transfer      Report consisted of patient?s Situation, Background, Assessment and   Recommendations(SBAR).     Information from the following report(s) SBAR and Kardex was reviewed with the receiving nurse.    Opportunity for questions and clarification was provided.      Assessment completed upon patient?s arrival to unit and care assumed.       Electronically signed by Jenita Grayce BRAVO, RN at 05/03/2009  5:48 PM EDT

## 2009-05-03 NOTE — Progress Notes (Signed)
 Formatting of this note might be different from the original.  Hospitalist    Patient  remains febrile, tachypneic breathing 40 times per minute, sats okay on face tent  Now not very responsive.  On broad spectrum antibiotics, nebs, IVF D5W to reduce sodium.  I am puzzled as to what is going on, presumed pneumonia and sepsis, AMS with metabolic encephalopathy with the increased sodium and infection. Husband states she often gets severe AMS when sick.  I don't think CHF is present, but was given lasix.  The constant movements have been going on since admit when she was A+O x 3 and the husband says that this behavior has been typical in the past. No frank tonic clonic activity. D/W neuro on call they felt seizure was unlikely, has been receiving IV ativan.  Will ask Dr Claudene to check on in the AM.  I am concerned she will continue to deteriorate, will transfer to ICU to be in a controlled setting in case she needs intubation.   Spoke with the husband about her deterioration and the ICU transfer, need for possible intubation.He agrees with central line placement if needed.    Elsie MYRTIS Katheryne Mickey., MD    Electronically signed by Katheryne Teddie Elsie SHAUNNA, MD at 05/03/2009  9:25 PM EDT

## 2009-05-03 NOTE — Progress Notes (Signed)
 Formatting of this note might be different from the original.  Nutrition Services    Body mass index is 29.76 kg/(m^2).    Nutrition screen completed. Pt screened at low/mod nutritional risk and will be rescreened in  2-5d  Electronically signed by Ivy Rosaline SAUNDERS at 05/03/2009  4:38 PM EDT

## 2009-05-03 NOTE — Progress Notes (Signed)
 Formatting of this note might be different from the original.  Spoke with Dr Myrla on pt condition. No changes at this time. Awaiting transfer to CCU. Will monitor.  Electronically signed by Olene Inocente BRAVO, RN at 05/03/2009 11:14 PM EDT

## 2009-05-03 NOTE — Progress Notes (Signed)
 Formatting of this note might be different from the original.  Paged respiratory therapy with new orders for breathing treatments.  Foley placed per order.  Electronically signed by Jenita Grayce BRAVO, RN at 05/03/2009  6:26 PM EDT

## 2009-05-03 NOTE — Progress Notes (Signed)
 Formatting of this note might be different from the original.  Patient returned by transport stretcher.  Agitated and thrashing about.  Transferred to bed, reapplied ordered restraints.  Electronically signed by Chandra Charleston B at 05/03/2009  9:17 AM EDT

## 2009-05-03 NOTE — Progress Notes (Signed)
 Formatting of this note might be different from the original.  Dr Katheryne called for update on pt condition. No change at this time. Ordered transfer to CCU. Notified Nrsg Supv. Will monitor pt.  Electronically signed by Olene Inocente BRAVO, RN at 05/03/2009 11:13 PM EDT

## 2009-05-03 NOTE — Progress Notes (Signed)
 Formatting of this note might be different from the original.  Updated Dr Asif of pt condition including increase resp 30-40s, increase HR 130-140s and recent cxr results. Pt is extremely restless and agitated. Orders rec for lasix 40mg  iv now. Will monitor  Electronically signed by Olene Inocente BRAVO, RN at 05/03/2009 11:07 PM EDT

## 2009-05-03 NOTE — Progress Notes (Addendum)
Patient transported by stretcher to Ultrasound.  Medicated with 1 mg IV push lorazepam.  Tolerated without complications.  Patient left unit at 08:00

## 2009-05-03 NOTE — Progress Notes (Signed)
Spoke with Dr Danae Chen on pt condition. No changes at this time. Awaiting transfer to CCU. Will monitor.

## 2009-05-03 NOTE — Progress Notes (Signed)
Paged respiratory therapy with new orders for breathing treatments.  Foley placed per order.

## 2009-05-03 NOTE — Progress Notes (Signed)
Nutrition Services    Body mass index is 29.76 kg/(m^2).    Nutrition screen completed. Pt screened at low/mod nutritional risk and will be rescreened in  2-5d

## 2009-05-03 NOTE — Progress Notes (Signed)
Hospitalist    Patient  remains febrile, tachypneic breathing 40 times per minute, sats okay on face tent  Now not very responsive.  On broad spectrum antibiotics, nebs, IVF D5W to reduce sodium.  I am puzzled as to what is going on, presumed pneumonia and sepsis, AMS with metabolic encephalopathy with the increased sodium and infection. Husband states she often gets severe AMS when "sick".  I don't think CHF is present, but was given lasix.  The constant movements have been going on since admit when she was A+O x 3 and the husband says that this behavior has been typical in the past. No frank tonic clonic activity. D/W neuro on call they felt seizure was unlikely, has been receiving IV ativan.  Will ask Dr Katrinka Blazing to check on in the AM.  I am concerned she will continue to deteriorate, will transfer to ICU to be in a controlled setting in case she needs intubation.   Spoke with the husband about her deterioration and the ICU transfer, need for possible intubation.He agrees with central line placement if needed.    Tawny Asal., MD

## 2009-05-03 NOTE — Progress Notes (Signed)
TRANSFER - IN REPORT:    Verbal report received from Robert on [name]  being received from Oncology for urgent transfer      Report consisted of patient???s Situation, Background, Assessment and   Recommendations(SBAR).     Information from the following report(s) SBAR and Kardex was reviewed with the receiving nurse.    Opportunity for questions and clarification was provided.      Assessment completed upon patient???s arrival to unit and care assumed.

## 2009-05-03 NOTE — Progress Notes (Signed)
Hospitalist    Patient transferred to stepdown, still with fevers and tachycardia.  CXR with worsening ASD, head CT negative  CPK elevated c/w rhabdomyalysis. Elevated troponin likely nonspecific, will check EKG.  Reviewed records prior history of MRSA pneumonia and respiratory failure requiring intubation.   Vanco/zosyn/levaquin.  Na Increased, will change to d5W at 200 cc hour to get free water in, recheck labs in am.  Lactulose enemas.  Hold sedativies, no haldol with elevated CPK.  Titrate O2  Nebs, may require ICU and intubation if continues to decline.    Tawny Asal., MD

## 2009-05-03 NOTE — Progress Notes (Signed)
Dr Skeet Latch in to see pt--orders rec. Will monitor pt

## 2009-05-03 NOTE — Progress Notes (Signed)
Paged Dr. Skeet Latch to report vitals and initial assessment findings of increased BP, low o2 sat's and tachypnea. New orders to be received.

## 2009-05-03 NOTE — Progress Notes (Signed)
Hospitalist    Patient transferred to stepdown, still with fevers and tachycardia.  CXR with worsening ASD  CPK elevated c/w rhabdomyalysis. Elevated troponin likely nonspecific, will check EKG.  Reviewed records prior history of MRSA pneumonia and respiratory failure requiring intubation.   Vanco/zosyn/levaquin.  Na Increased, will change to d5W at 200 cc hour to get free water in, recheck labs in am.  Lactulose enemas.  Hold sedativies, no haldol with elevated CPK.  Titrate O2  Nebs, may require ICU and intubation if continues to decline.    Tawny Asal., MD

## 2009-05-03 NOTE — Progress Notes (Addendum)
Hospitalist Progress Note    NAME:  Breanna Lester   DOB:   Nov 28, 1960   MRN:   [id number]     Date/Time:  05/03/2009 8:55 AM  Subjective:   Chief Complaint:  F/u hypokalemia, N/V, hepatic encephalopathy  Still thrashing about the bed, non verbal today  Much more tachypneic today, hypoxic today(does wear home O2 at night, was heavy smoker per husband)  Spoke with husband, notes often gets agitated, confused with acute illness:  No alcohol in years per husband.CXR with ? Pneumonia, some cough and low grade fever today.  Full code    Review of Systems:  Y   N        Y   N  []            []             Fever/chills                                               []            []             Chest Pain  []            []             Cough                                                       []            []             Diarrhea with lactulose  []            []             Sputum                                                     []            []             Constipation  []            []             SOB/DOE                                                []            []             Nausea/Vomit  []            []             Abd Pain                                                    []            []   Tolerating PT  []            []             Dysuria                                                      []            []             Tolerating Diet     [x]           Unable to obtain  ROS due to  [x]       mental status change  []       sedated   []       intubated     Past Med History and Social history reviewed. No changes.   [x]           Current Medication list and allergies reviewed*    Objective:   Vitals:  BP 112/77   Pulse 104   Temp 98.6 ??F (37 ??C)   Resp 22   Ht 5\' 2"  (1.575 m)   Wt 162 lb 11.2 oz (73.8 kg)   SpO2 89%  Temp (24hrs), Avg:98.6 ??F (37 ??C), Min:98.4 ??F (36.9 ??C), Max:98.6 ??F (37 ??C)      Last 24hr Input/Output:  No intake or output data in the 24 hours ending 05/01/09 0851     PHYSICAL EXAM:   General:???? ???????????? Alert, constantly moving in bed, nonverbal  Head:???? ??????????????????????Normocephalic, atraumatic.     PERRL, sclera non icteric ????????????????????????  ?????????????????????????? Oropharynx clear, no exudates.  Neck:  No meningismus No masses  Lungs:?? ???????????????????? No accessory muscle use or retractions     Rhonchi bilaterally  Heart: ????????????????????   No JVD     Regular rate and rhythm,?? No murmurs No gallops.     No LE edema  Abdomen:?? ????????Soft, non-tender. Not distended.?? Bowel sounds normal.        LN:    No cervical or inguinal LN  Skin:?????????? ??????????????????No rashes  MS:  No joint swelling, erthema, warmth. No cyanosis/clubbing   Neurologic:????????  Alert, slow, not oriented today    CN intact    Motor exam nonfocal     [x]        Telemetry Reviewed     []        NSR []        PAC/PVCs   []        Afib  []        Paced   []        NSVT   []        Foley []        NGT  []        Intubated on vent     [x]          Reviewed most current laboratory data**   []          I have personally reviewed the   []        xray  []        CT scan    [place] with no GB changes, no ascites    KUB with no obstruction.    ABG 7.36/38/53 on 2 liters    Assessment/Plan:     Patient Active Hospital Problem List:    Sepsis syndrome likely underlying pneumonia, fever, leukocytosis  Broad spectrum antibiotics.   No evidence of UTI or ascites(SBP)    Acute respiratory failure O2, antibiotics, check CXR, transfer to stepdown bed.   Nebs    Altered mental status likely metabolic encephalopathy, hypernatremia.   IVF, antibiotics, d/c all sedatives, PR lactulose   Ammonia and hypokalemia improved   Unable to cooperate for CT scan yesterday, will try again today.   Moving all limbs well    Hypernatremia IV free water    Pneumonia levaquin and zosyn, titrate O2.    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    PICC line if it would be safe to place with agitation    ___________________________________________________  Risk of deterioration:  []        Low    []        Moderate  [x]        High  __________________________________________    Total time spent with patient:  []        15   [x]        25   []        35   []         __ minutes    []        Critical Care Provided    Care Plan discussed with:    [x]        Patient   []        Family    []        Care Manager    [x]        Nursing   []        Consultant/Specialist :      []          >50% of visit spent in counseling and coordination of care   (Discussed []        CODE status,  []        Care Plan, []        D/C Planning)    Prophylaxis:  [x]        Lovenox  []        Coumadin  []        Hep SQ  []        SCD???s  [x]        H2B/PPI    Disposition:  [x]        Home w/ Family   [x]        HH PT,OT,RN   []        SNF/LTC   []        SAH/Rehab  ___________________________________________________    Hospitalist: Tawny Asal., MD     ___________________________________________________    *Medications reviewed:  Current facility-administered medications   Medication Dose Route Frequency   ??? sodium chloride 0.9 % injection        ??? piperacillin-tazobactam (ZOSYN) 3.375 g in 0.9% sodium chloride (MBP/ADV) 100 mL MBP  3.375 g IntraVENous Q6H   ??? dextrose 5% infusion    IntraVENous CONTINUOUS   ??? DISCONTD: 0.45% sodium chloride infusion  IntraVENous CONTINUOUS   ??? levofloxacin (LEVAQUIN) 750 mg infusion   750 mg IntraVENous Q24H   ??? DISCONTD: HYDROmorphone (PF) (DILAUDID) injection 0.5 mg  0.5 mg IntraVENous Q4H PRN    ??? DISCONTD: lorazepam (ATIVAN) injection 1 mg  1 mg IntraVENous Q6H PRN   ??? DISCONTD: metronidazole (FLAGYL) IVPB 500 mg  500 mg IntraVENous Q8H   ??? DISCONTD: haloperidol (HALDOL) tablet 1 mg  1 mg Oral Q1H PRN   ??? DISCONTD: haloperidol lactate (HALDOL) injection 1 mg  1 mg IntraVENous Q1H PRN   ??? DISCONTD: acetaminophen (TYLENOL) suppository 650 mg  650 mg Rectal Q6H PRN   ??? DISCONTD: HYDROmorphone (PF) (DILAUDID) injection 0.5 mg  0.5 mg IntraVENous Q4H PRN   ??? DISCONTD: HYDROmorphone (PF) (DILAUDID) injection 0.5 mg  0.5 mg IntraVENous Q4H PRN   ??? enoxaparin (LOVENOX) injection 40 mg  40 mg SubCUTAneous Q24H   ??? DISCONTD: lactulose (CHRONULAC) solution 20 g  20 g Oral DAILY   ??? SALINE PERIPHERAL FLUSH Q8H 5 mL  5 mL InterCATHeter Q8H   ??? saline peripheral flush 5 mL  5 mL InterCATHeter PRN   ??? DISCONTD: sertraline (ZOLOFT) tablet 100 mg  100 mg Oral DAILY   ??? DISCONTD: prochlorperazine (COMPAZINE) injection 5 mg  5 mg IntraVENous Q6H PRN            **Lab Data Reviewed:  Recent Results (from the past 24 hour(s))   CBC WITH MANUAL DIFF    Collection Time    05/03/09  2:55 AM   Component Value Range   ??? WBC 12.4 (*) 3.6 - 11.0 (K/uL)   ??? RBC 3.52 (*) 3.80 - 5.20 (M/uL)   ??? HGB 9.2 (*) 11.5 - 16.0 (g/dL)   ??? HCT 30.3 (*) 35.0 - 47.0 (%)   ??? MCV 86.1  80.0 - 99.0 (FL)   ??? MCH 26.1  26.0 - 34.0 (PG)   ??? MCHC 30.4  30.0 - 36.5 (g/dL)   ??? RDW 17.2 (*) 11.5 - 14.5 (%)   ??? PLATELET 123 (*) 150 - 400 (K/uL)   ??? NEUTROPHILS 83 (*) 32 - 75 (%)   ??? BANDS 1  0 - 6 (%)   ??? LYMPHOCYTES 11 (*) 12 - 49 (%)   ??? MONOCYTES 4 (*) 5 - 13 (%)   ??? EOSINOPHILS 1  0 - 7 (%)   ??? BASOPHILS 0  0 - 1 (%)   ??? METAMYELOCYTES 0  (%)   ??? MYELOCYTES 0  0 (%)   ??? PROMYELOCYTES 0  0 (%)   ??? BLASTS 0  0 (%)   ??? OTHER CELL 0  0 ( )   ??? ABSOLUTE NEUTS 10.4 (*) 1.8 - 8.0 (K/UL)   ??? ABSOLUTE LYMPHS 1.4  0.8 - 3.5 (K/UL)   ??? ABSOLUTE MONOS 0.5  0.0 - 1.0 (K/UL)   ??? ABSOLUTE EOSINS 0.1  0.0 - 0.4 (K/UL)   ??? ABSOLUTE BASOS 0.0  0.0 - 0.1 (K/UL)    ??? RBC COMMENTS NORMOCYTIC, NORMOCHROMIC     ??? DF MANUAL     ??? NRBC 0.0  0 (PER 100 WBC)   ??? ABSOLUTE NRBC 0.00  0.00 - 0.01 (K/uL)   METABOLIC PANEL, COMPREHENSIVE    Collection Time    05/03/09  2:55 AM   Component Value Range   ??? Sodium 157 (*) 136 - 145 (MMOL/L)   ??? Potassium 4.6  3.5 - 5.1 (MMOL/L)   ??? Chloride 120 (*)  97 - 108 (MMOL/L)   ??? CO2 22  21 - 32 (MMOL/L)   ??? Anion gap 15  5 - 15 (mmol/L)   ??? Glucose 91  65 - 100 (MG/DL)   ??? BUN 21 (*) 6 - 20 (MG/DL)   ??? Creatinine 1.2  0.6 - 1.3 (MG/DL)   ??? BUN/Creatinine ratio 18  12 - 20 ( )   ??? GFR est AA >60  >60 (ml/min/1.15m2)   ??? GFR est non-AA 51 (*) >60 (ml/min/1.80m2)   ??? Calcium 10.0  8.5 - 10.1 (MG/DL)   ??? Bilirubin, total 4.8 (*) 0.2 - 1.0 (MG/DL)   ??? ALT 61  12 - 78 (U/L)   ??? AST 159 (*) 15 - 37 (U/L)   ??? Alk. phosphatase 197 (*) 50 - 136 (U/L)   ??? Protein, total 6.8  6.4 - 8.2 (g/dL)   ??? Albumin 3.3 (*) 3.5 - 5.0 (g/dL)   ??? Globulin 3.5  2.0 - 4.0 (g/dL)   ??? A-G Ratio 0.9 (*) 1.1 - 2.2 ( )   LIPASE    Collection Time    05/03/09  2:55 AM   Component Value Range   ??? Lipase 59 (*) 73 - 393 (U/L)   MAGNESIUM    Collection Time    05/03/09  2:55 AM   Component Value Range   ??? Magnesium 2.2  1.6 - 2.4 (MG/DL)   BLOOD GAS, ARTERIAL    Collection Time    05/03/09  2:25 PM   Component Value Range   ??? pH 7.37  7.35 - 7.45 ( )   ??? PCO2 39  35 - 45 (mmHg)   ??? PO2 53 (*) 80 - 100 (mmHg)   ??? O2 SAT 86 (*) 92 - 97 (%)   ??? BICARBONATE 22  22 - 26 (mmol/L)   ??? BASE DEFICIT 3.2 (*) BE NORMAL RANGE -3 T (mmol/L)   ??? O2 FLOW RATE 2.5  (L/min)   ??? SITE RB     ??? SPONTANEOUS RATE 38  ( )   ??? SAMPLE SOURCE ARTERIAL     ??? O2 METHOD NASAL O2

## 2009-05-03 NOTE — Progress Notes (Signed)
Updated Dr Skeet Latch of pt condition, increase temp to 103.3 axillary, hr still up 130-140s and resp still up 30-40s. Orders received. Tylenol supp given and icepacks placed in armpits. Awaiting toradol order from Dr Skeet Latch. Will monitor

## 2009-05-03 NOTE — Progress Notes (Signed)
Dr Skeet Latch called for update on pt condition. No change at this time. Ordered transfer to CCU. Notified Nrsg Supv. Will monitor pt.

## 2009-05-03 NOTE — Progress Notes (Signed)
Occupational Therapy  Orders received and medical record reviewed.  Spoke with nursing earlier this a.m. who reports that pt has received sedative, is restrained and is not following commands at this time.   Read recent PT note and agree that pt is not appropriate for initial OT evaluation at this time.   Will follow up tomorrow.

## 2009-05-03 NOTE — Progress Notes (Signed)
Patient returned by transport stretcher.  Agitated and thrashing about.  Transferred to bed, reapplied ordered restraints.

## 2009-05-03 NOTE — Progress Notes (Deleted)
Hospitalist Progress Note    NAME:  Breanna Lester   DOB:   June 16, 1961   MRN:   [id number]     Date/Time:  05/03/2009 8:55 AM  Subjective:   Chief Complaint:  F/u hypokalemia, N/V, hepatic encephalopathy  Still thrashing about the bed, non verbal today  Much more tachypneic today, hypoxic today(does wear home O2 at night, was heavy smoker per husband)  Spoke with husband, notes often gets agitated, confused with acute illness:  No alcohol in years per husband.CXR with ? Pneumonia, some cough and low grade fever today.  Full code per husband, she has never expressed a desire to be DNR    Review of Systems:  Y   N        Y   N  []             []              Fever/chills                                               []             []              Chest Pain  []             []              Cough                                                       []             []              Diarrhea with lactulose  []             []              Sputum                                                     []             []              Constipation  []             []              SOB/DOE                                                []             []              Nausea/Vomit  []             []              Abd Pain                                                    []             []   Tolerating PT  []             []              Dysuria                                                      []             []              Tolerating Diet     [x]            Unable to obtain  ROS due to  [x]        mental status change  []        sedated   []        intubated     Past Med History and Social history reviewed. No changes.   [x]            Current Medication list and allergies reviewed*    Objective:   Vitals:  BP 112/77   Pulse 104   Temp 98.6 ??F (37 ??C)   Resp 22   Ht 5\' 2"  (1.575 m)   Wt 162 lb 11.2 oz (73.8 kg)   SpO2 89%  Temp (24hrs), Avg:98.6 ??F (37 ??C), Min:98.4 ??F (36.9 ??C), Max:98.6 ??F (37 ??C)      Last 24hr Input/Output:   No intake or output data in the 24 hours ending 05/01/09 0851     PHYSICAL EXAM:  General:???? ???????????? Alert, constantly moving in bed, nonverbal  Head:???? ??????????????????????Normocephalic, atraumatic.     PERRL, sclera non icteric ????????????????????????  ?????????????????????????? Oropharynx clear, no exudates.  Neck:  No meningismus No masses  Lungs:?? ???????????????????? No accessory muscle use or retractions     Rhonchi bilaterally  Heart: ????????????????????   No JVD     Regular rate and rhythm,?? No murmurs No gallops.     No LE edema  Abdomen:?? ????????Soft, non-tender. Not distended.?? Bowel sounds normal.        LN:    No cervical or inguinal LN  Skin:?????????? ??????????????????No rashes  MS:  No joint swelling, erthema, warmth. No cyanosis/clubbing   Neurologic:????????  Alert, slow, not oriented today    CN intact    Motor exam nonfocal     [x]         Telemetry Reviewed     []         NSR []         PAC/PVCs   []         Afib  []         Paced   []         NSVT   []         Foley []         NGT  []         Intubated on vent     [x]           Reviewed most current laboratory data**   []           I have personally reviewed the   []         xray  []         CT scan    [place] with no GB changes, no ascites    KUB with no obstruction.    ABG 7.36/38/53 on 2 liters  Assessment/Plan:     Patient Active Hospital Problem List:    Sepsis syndrome likely underlying pneumonia, fever, leukocytosis   Broad spectrum antibiotics.   No evidence of UTI or ascites(SBP)    Acute respiratory failure O2, antibiotics, check CXR, transfer to stepdown bed.   Nebs    Altered mental status likely metabolic encephalopathy, hypernatremia.   IVF, antibiotics, d/c all sedatives, PR lactulose   Ammonia and hypokalemia improved   Unable to cooperate for CT scan yesterday, will try again today.   Moving all limbs well    Hypernatremia IV free water    Pneumonia levaquin and zosyn, titrate O2.    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    PICC line if it would be safe to place with agitation    ___________________________________________________  Risk of deterioration:  []         Low    []         Moderate  [x]         High  __________________________________________    Total time spent with patient:  []         15   [x]         25   []         35   []          __ minutes    []         Critical Care Provided    Care Plan discussed with:    [x]         Patient   []         Family    []         Care Manager    [x]         Nursing   []         Consultant/Specialist :      []           >50% of visit spent in counseling and coordination of care   (Discussed []         CODE status,  []         Care Plan, []         D/C Planning)    Prophylaxis:  [x]         Lovenox  []         Coumadin  []         Hep SQ  []         SCD???s  [x]         H2B/PPI    Disposition:  [x]         Home w/ Family   [x]         HH PT,OT,RN   []         SNF/LTC   []         SAH/Rehab  ___________________________________________________    Hospitalist: Tawny Asal., MD     ___________________________________________________    *Medications reviewed:  Current facility-administered medications   Medication Dose Route Frequency   ??? sodium chloride 0.9 %  injection        ??? piperacillin-tazobactam (ZOSYN) 3.375 g in 0.9% sodium chloride (MBP/ADV) 100 mL MBP  3.375 g IntraVENous Q6H   ??? lactulose (CHRONULAC) solution 200 g  300 mL Rectal BID   ??? sodium chloride 0.9 % injection         ??? acetaminophen (TYLENOL) suppository 650 mg  650 mg Rectal Q6H PRN   ??? sodium chloride 0.9 % injection        ??? lorazepam (ATIVAN) injection 1 mg  1 mg IntraVENous ONCE   ??? vancomycin (VANCOCIN) 1.75 g in 0.9% sodium chloride 500 mL IVPB  1,750 mg IntraVENous ONCE   ??? dextrose 5% infusion    IntraVENous CONTINUOUS   ??? albuterol (PROVENTIL VENTOLIN) nebulizer solution 2.5 mg  2.5 mg Nebulization QID   ??? ipratropium (ATROVENT) 0.02 % nebulizer solution 0.5 mg  0.5 mg Nebulization QID   ??? albuterol (PROVENTIL VENTOLIN) nebulizer solution 2.5 mg  2.5 mg Nebulization Q2H PRN   ??? thiamine (B-1) 100 mg in 0.9% sodium chloride 50 mL IVPB  100 mg IntraVENous DAILY   ??? furosemide (LASIX) injection 40 mg  40 mg IntraVENous ONCE   ??? nitroglycerin (NITROBID) 2 % ointment 2 Inch  2 Inch Topical Q6H PRN   ??? DISCONTD: 0.45% sodium chloride infusion    IntraVENous CONTINUOUS   ??? DISCONTD: dextrose 5% infusion    IntraVENous CONTINUOUS   ??? DISCONTD: ketorolac (TORADOL) injection 15 mg  15 mg IntraVENous Q6H PRN   ??? DISCONTD: vancomycin (VANCOCIN) in 0.9% sodium chloride 250 mL infusion    IntraVENous    ??? DISCONTD: vancomycin (VANCOCIN) 1.75 g in 0.9% sodium chloride 500 mL IVPB  1,750 mg IntraVENous ONCE   ??? DISCONTD: vancomycin (VANCOCIN) 1.75 g in 0.9% sodium chloride 500 mL IVPB  1,750 mg IntraVENous ONCE   ??? DISCONTD: thiamine (B-1) injection 100 mg  100 mg IntraVENous DAILY   ??? levofloxacin (LEVAQUIN) 750 mg infusion   750 mg IntraVENous Q24H   ??? DISCONTD: HYDROmorphone (PF) (DILAUDID) injection 0.5 mg  0.5 mg IntraVENous Q4H PRN   ??? DISCONTD: lorazepam (ATIVAN) injection 1 mg  1 mg IntraVENous Q6H PRN   ??? DISCONTD: metronidazole (FLAGYL) IVPB 500 mg  500 mg IntraVENous Q8H   ??? DISCONTD: haloperidol lactate (HALDOL) injection 1 mg  1 mg IntraVENous Q1H PRN   ??? DISCONTD: acetaminophen (TYLENOL) suppository 650 mg  650 mg Rectal Q6H PRN   ??? DISCONTD: HYDROmorphone (PF) (DILAUDID) injection 0.5 mg  0.5 mg IntraVENous Q4H PRN    ??? DISCONTD: HYDROmorphone (PF) (DILAUDID) injection 0.5 mg  0.5 mg IntraVENous Q4H PRN   ??? enoxaparin (LOVENOX) injection 40 mg  40 mg SubCUTAneous Q24H   ??? DISCONTD: lactulose (CHRONULAC) solution 20 g  20 g Oral DAILY   ??? SALINE PERIPHERAL FLUSH Q8H 5 mL  5 mL InterCATHeter Q8H   ??? saline peripheral flush 5 mL  5 mL InterCATHeter PRN   ??? DISCONTD: sertraline (ZOLOFT) tablet 100 mg  100 mg Oral DAILY            **Lab Data Reviewed:  Recent Results (from the past 24 hour(s))   CBC WITH MANUAL DIFF    Collection Time    05/03/09  2:55 AM   Component Value Range   ??? WBC 12.4 (*) 3.6 - 11.0 (K/uL)   ??? RBC 3.52 (*) 3.80 - 5.20 (M/uL)   ??? HGB 9.2 (*) 11.5 - 16.0 (g/dL)   ??? HCT  30.3 (*) 35.0 - 47.0 (%)   ??? MCV 86.1  80.0 - 99.0 (FL)   ??? MCH 26.1  26.0 - 34.0 (PG)   ??? MCHC 30.4  30.0 - 36.5 (g/dL)   ??? RDW 17.2 (*) 11.5 - 14.5 (%)   ??? PLATELET 123 (*) 150 - 400 (K/uL)   ??? NEUTROPHILS 83 (*) 32 - 75 (%)   ??? BANDS 1  0 - 6 (%)   ??? LYMPHOCYTES 11 (*) 12 - 49 (%)   ??? MONOCYTES 4 (*) 5 - 13 (%)   ??? EOSINOPHILS 1  0 - 7 (%)   ??? BASOPHILS 0  0 - 1 (%)   ??? METAMYELOCYTES 0  (%)   ??? MYELOCYTES 0  0 (%)   ??? PROMYELOCYTES 0  0 (%)   ??? BLASTS 0  0 (%)   ??? OTHER CELL 0  0 ( )   ??? ABSOLUTE NEUTS 10.4 (*) 1.8 - 8.0 (K/UL)   ??? ABSOLUTE LYMPHS 1.4  0.8 - 3.5 (K/UL)   ??? ABSOLUTE MONOS 0.5  0.0 - 1.0 (K/UL)   ??? ABSOLUTE EOSINS 0.1  0.0 - 0.4 (K/UL)   ??? ABSOLUTE BASOS 0.0  0.0 - 0.1 (K/UL)   ??? RBC COMMENTS NORMOCYTIC, NORMOCHROMIC     ??? DF MANUAL     ??? NRBC 0.0  0 (PER 100 WBC)   ??? ABSOLUTE NRBC 0.00  0.00 - 0.01 (K/uL)   METABOLIC PANEL, COMPREHENSIVE    Collection Time    05/03/09  2:55 AM   Component Value Range   ??? Sodium 157 (*) 136 - 145 (MMOL/L)   ??? Potassium 4.6  3.5 - 5.1 (MMOL/L)   ??? Chloride 120 (*) 97 - 108 (MMOL/L)   ??? CO2 22  21 - 32 (MMOL/L)   ??? Anion gap 15  5 - 15 (mmol/L)   ??? Glucose 91  65 - 100 (MG/DL)   ??? BUN 21 (*) 6 - 20 (MG/DL)   ??? Creatinine 1.2  0.6 - 1.3 (MG/DL)   ??? BUN/Creatinine ratio 18  12 - 20 ( )    ??? GFR est AA >60  >60 (ml/min/1.65m2)   ??? GFR est non-AA 51 (*) >60 (ml/min/1.45m2)   ??? Calcium 10.0  8.5 - 10.1 (MG/DL)   ??? Bilirubin, total 4.8 (*) 0.2 - 1.0 (MG/DL)   ??? ALT 61  12 - 78 (U/L)   ??? AST 159 (*) 15 - 37 (U/L)   ??? Alk. phosphatase 197 (*) 50 - 136 (U/L)   ??? Protein, total 6.8  6.4 - 8.2 (g/dL)   ??? Albumin 3.3 (*) 3.5 - 5.0 (g/dL)   ??? Globulin 3.5  2.0 - 4.0 (g/dL)   ??? A-G Ratio 0.9 (*) 1.1 - 2.2 ( )   LIPASE    Collection Time    05/03/09  2:55 AM   Component Value Range   ??? Lipase 59 (*) 73 - 393 (U/L)   MAGNESIUM    Collection Time    05/03/09  2:55 AM   Component Value Range   ??? Magnesium 2.2  1.6 - 2.4 (MG/DL)   BLOOD GAS, ARTERIAL    Collection Time    05/03/09  2:25 PM   Component Value Range   ??? pH 7.37  7.35 - 7.45 ( )   ??? PCO2 39  35 - 45 (mmHg)   ??? PO2 53 (*) 80 - 100 (mmHg)   ??? O2 SAT 86 (*)  92 - 97 (%)   ??? BICARBONATE 22  22 - 26 (mmol/L)   ??? BASE DEFICIT 3.2 (*) BE NORMAL RANGE -3 T (mmol/L)   ??? O2 FLOW RATE 2.5  (L/min)   ??? SITE RB     ??? SPONTANEOUS RATE 38  ( )   ??? SAMPLE SOURCE ARTERIAL     ??? O2 METHOD NASAL O2     METABOLIC PANEL, BASIC    Collection Time    05/03/09  3:33 PM   Component Value Range   ??? Sodium 162 (*) 136 - 145 (MMOL/L)   ??? Potassium 4.7  3.5 - 5.1 (MMOL/L)   ??? Chloride 128 (*) 97 - 108 (MMOL/L)   ??? CO2 22  21 - 32 (MMOL/L)   ??? Anion gap 12  5 - 15 (mmol/L)   ??? Glucose 108 (*) 65 - 100 (MG/DL)   ??? BUN 24 (*) 6 - 20 (MG/DL)   ??? Creatinine 1.3  0.6 - 1.3 (MG/DL)   ??? BUN/Creatinine ratio 18  12 - 20 ( )   ??? GFR est AA 56 (*) >60 (ml/min/1.36m2)   ??? GFR est non-AA 46 (*) >60 (ml/min/1.71m2)   ??? Calcium 9.7  8.5 - 10.1 (MG/DL)   CK W/ CKMB & INDEX    Collection Time    05/03/09  3:33 PM   Component Value Range   ??? CK 3788 (*) 21 - 215 (U/L)   ??? CK - MB 26.4 (*) 0.5 - 3.6 (NG/ML)   ??? CK-MB Index 0.7  0 - 2.5 ( )   TROPONIN I    Collection Time    05/03/09  3:33 PM   Component Value Range   ??? Troponin-I, Qt. 0.69 (*) <0.05 (ng/mL)

## 2009-05-03 NOTE — Progress Notes (Signed)
Updated Dr Rolly Salter of pt condition including increase resp 30-40s, increase HR 130-140s and recent cxr results. Pt is extremely restless and agitated. Orders rec for lasix 40mg  iv now. Will monitor

## 2009-05-03 NOTE — Progress Notes (Signed)
Physical Therapy    Attempting this am to see patient for Physical Therapy initial evaluation. Patient remains agitated and trashing in bed; restraints in place and sitter present. Patient remains inappropriate for PT today.  Will follow back tomorrow.    Nicholes Stairs, PT

## 2009-05-04 LAB — BLOOD GAS ARTERIAL,VENT
BASE DEFICIT: 1 mmol/L — AB
BASE DEFICIT: 0.7 mmol/L — AB
BICARBONATE: 24 mmol/L (ref 22–26)
BICARBONATE: 22 mmol/L (ref 22–26)
FIO2: 70 %
FIO2: 100 %
O2 SAT: 93 % (ref 92–97)
O2 SAT: 100 % — ABNORMAL HIGH (ref 92–97)
PCO2: 39 mmHg (ref 35–45)
PCO2: 31 mmHg — ABNORMAL LOW (ref 35–45)
PEEP/CPAP: 5
PO2: 65 mmHg — ABNORMAL LOW (ref 80–100)
PO2: 507 mmHg — ABNORMAL HIGH (ref 80–100)
SET RATE: 14
SPONTANEOUS RATE: 36
SPONTANEOUS RATE: 30
VT/PIP: 600
pH: 7.4 (ref 7.35–7.45)
pH: 7.47 — ABNORMAL HIGH (ref 7.35–7.45)

## 2009-05-04 LAB — TROPONIN I
Troponin-I, Qt.: 9.1 ng/mL — ABNORMAL HIGH (ref <0.05)
Troponin-I, Qt.: 19.4 ng/mL — ABNORMAL HIGH (ref <0.05)

## 2009-05-04 LAB — CBC WITH AUTOMATED DIFF
ABS. BASOPHILS: 0 10*3/uL (ref 0.0–0.1)
ABS. EOSINOPHILS: 0 10*3/uL (ref 0.0–0.4)
ABS. LYMPHOCYTES: 0.9 10*3/uL (ref 0.8–3.5)
ABS. MONOCYTES: 1.5 10*3/uL — ABNORMAL HIGH (ref 0.0–1.0)
ABS. NEUTROPHILS: 12.2 10*3/uL — ABNORMAL HIGH (ref 1.8–8.0)
BASOPHILS: 0 % (ref 0–1)
EOSINOPHILS: 0 % (ref 0–7)
HCT: 32.2 % — ABNORMAL LOW (ref 35.0–47.0)
HGB: 9.7 g/dL — ABNORMAL LOW (ref 11.5–16.0)
LYMPHOCYTES: 6 % — ABNORMAL LOW (ref 12–49)
MCH: 26.3 PG (ref 26.0–34.0)
MCHC: 30.1 g/dL (ref 30.0–36.5)
MCV: 87.3 FL (ref 80.0–99.0)
MONOCYTES: 10 % (ref 5–13)
NEUTROPHILS: 84 % — ABNORMAL HIGH (ref 32–75)
PLATELET: 118 10*3/uL — ABNORMAL LOW (ref 150–400)
RBC: 3.69 M/uL — ABNORMAL LOW (ref 3.80–5.20)
RDW: 18.7 % — ABNORMAL HIGH (ref 11.5–14.5)
WBC: 14.6 10*3/uL — ABNORMAL HIGH (ref 3.6–11.0)

## 2009-05-04 LAB — METABOLIC PANEL, BASIC
Anion gap: 12 mmol/L (ref 5–15)
BUN/Creatinine ratio: 17 (ref 12–20)
BUN: 26 MG/DL — ABNORMAL HIGH (ref 6–20)
CO2: 20 MMOL/L — ABNORMAL LOW (ref 21–32)
Calcium: 8.3 MG/DL — ABNORMAL LOW (ref 8.5–10.1)
Chloride: 116 MMOL/L — ABNORMAL HIGH (ref 97–108)
Creatinine: 1.5 MG/DL — ABNORMAL HIGH (ref 0.6–1.3)
GFR est AA: 48 mL/min/{1.73_m2} — ABNORMAL LOW (ref >60)
GFR est non-AA: 39 mL/min/{1.73_m2} — ABNORMAL LOW (ref >60)
Glucose: 483 MG/DL — ABNORMAL HIGH (ref 65–100)
Potassium: 3.5 MMOL/L (ref 3.5–5.1)
Sodium: 148 MMOL/L — ABNORMAL HIGH (ref 136–145)

## 2009-05-04 LAB — CK W/ CKMB & INDEX
CK - MB: 23.2 NG/ML — ABNORMAL HIGH (ref 0.5–3.6)
CK - MB: 7.4 NG/ML — ABNORMAL HIGH (ref 0.5–3.6)
CK-MB Index: 0.5 (ref 0–2.5)
CK-MB Index: 0.3 (ref 0–2.5)
CK: 4683 U/L — ABNORMAL HIGH (ref 21–215)
CK: 2222 U/L — ABNORMAL HIGH (ref 21–215)

## 2009-05-04 LAB — NT-PRO BNP: NT pro-BNP: 24113 PG/ML — ABNORMAL HIGH (ref 0–125)

## 2009-05-04 LAB — AMMONIA: Ammonia, plasma: 64 umol/L — ABNORMAL HIGH (ref <32)

## 2009-05-04 LAB — MAGNESIUM: Magnesium: 2.2 MG/DL (ref 1.6–2.4)

## 2009-05-04 MED ADMIN — furosemide (LASIX) 10 mg/mL injection: @ 07:00:00 | NDC 00409610204

## 2009-05-04 MED ADMIN — propofol (DIPRIVAN) infusion: INTRAVENOUS | @ 15:00:00 | NDC 63323027050

## 2009-05-04 MED ADMIN — 0.45% sodium chloride infusion: INTRAVENOUS | @ 11:00:00 | NDC 00409798509

## 2009-05-04 MED ADMIN — acetaminophen (TYLENOL) suppository 650 mg: RECTAL | @ 08:00:00 | NDC 00713016506

## 2009-05-04 MED ADMIN — ipratropium (ATROVENT) 0.02 % nebulizer solution 0.5 mg: RESPIRATORY_TRACT | @ 02:00:00 | NDC 68115077114

## 2009-05-04 MED ADMIN — acetaminophen (TYLENOL) suppository 650 mg: RECTAL | @ 23:00:00 | NDC 00713016506

## 2009-05-04 MED ADMIN — propofol (DIPRIVAN) infusion: INTRAVENOUS | @ 11:00:00 | NDC 63323027050

## 2009-05-04 MED ADMIN — acetaminophen (TYLENOL) suppository 650 mg: RECTAL | @ 16:00:00 | NDC 00713016506

## 2009-05-04 MED ADMIN — ipratropium (ATROVENT) 0.02 % nebulizer solution 0.5 mg: RESPIRATORY_TRACT | @ 06:00:00 | NDC 00487980101

## 2009-05-04 MED ADMIN — ketorolac (TORADOL) injection 15 mg: INTRAVENOUS | @ 01:00:00 | NDC 00409379501

## 2009-05-04 MED ADMIN — albuterol (PROVENTIL VENTOLIN) nebulizer solution 2.5 mg: RESPIRATORY_TRACT | @ 01:00:00 | NDC 00487950101

## 2009-05-04 MED ADMIN — dextrose 5% infusion: INTRAVENOUS | @ 06:00:00 | NDC 00409792209

## 2009-05-04 MED ADMIN — albuterol/ipratropium (DUONEB) neb solution: RESPIRATORY_TRACT | @ 16:00:00 | NDC 00487980101

## 2009-05-04 MED ADMIN — thiamine (B-1) 100 mg in 0.9% sodium chloride 50 mL IVPB: INTRAVENOUS | @ 17:00:00 | NDC 63323001302

## 2009-05-04 MED ADMIN — saline peripheral flush 5 mL: @ 16:00:00 | NDC 87701099893

## 2009-05-04 MED ADMIN — aspirin (ASA) suppository 300 mg: RECTAL | @ 15:00:00 | NDC 00574703412

## 2009-05-04 MED ADMIN — saline peripheral flush 5 mL: @ 01:00:00 | NDC 87701099893

## 2009-05-04 MED ADMIN — albuterol (PROVENTIL VENTOLIN) nebulizer solution 2.5 mg: RESPIRATORY_TRACT | @ 02:00:00 | NDC 76204020060

## 2009-05-04 MED ADMIN — propofol (DIPRIVAN) infusion: INTRAVENOUS | @ 17:00:00 | NDC 63323027050

## 2009-05-04 MED ADMIN — 0.45% sodium chloride infusion: INTRAVENOUS | @ 21:00:00 | NDC 00409798509

## 2009-05-04 MED ADMIN — acetaminophen (TYLENOL) suppository 650 mg: RECTAL | @ 01:00:00 | NDC 00713016550

## 2009-05-04 MED ADMIN — metoprolol tartrate (LOPRESSOR) injection 5 mg: INTRAVENOUS | @ 16:00:00 | NDC 00143987310

## 2009-05-04 MED ADMIN — piperacillin-tazobactam (ZOSYN) 3.375 g in 0.9% sodium chloride (MBP/ADV) 100 mL MBP: INTRAVENOUS | @ 10:00:00 | NDC 00338055318

## 2009-05-04 MED ADMIN — dextrose 5% infusion: INTRAVENOUS | @ 01:00:00 | NDC 00338001704

## 2009-05-04 MED ADMIN — sodium chloride (NS) flush 10 mL: @ 22:00:00 | NDC 87701099893

## 2009-05-04 MED ADMIN — propofol (DIPRIVAN) 10 mg/mL injection: @ 13:00:00 | NDC 63323027050

## 2009-05-04 MED ADMIN — lactulose (CHRONULAC) solution 200 g: RECTAL | @ 18:00:00 | NDC 00121457730

## 2009-05-04 MED ADMIN — ipratropium (ATROVENT) 0.02 % nebulizer solution 0.5 mg: RESPIRATORY_TRACT | @ 12:00:00 | NDC 00487980101

## 2009-05-04 MED ADMIN — propofol (DIPRIVAN) infusion: INTRAVENOUS | @ 21:00:00 | NDC 63323027050

## 2009-05-04 MED ADMIN — albuterol (PROVENTIL VENTOLIN) nebulizer solution 2.5 mg: RESPIRATORY_TRACT | @ 06:00:00 | NDC 00487950101

## 2009-05-04 MED ADMIN — enoxaparin (LOVENOX) injection 60 mg: SUBCUTANEOUS | @ 16:00:00 | NDC 00075062160

## 2009-05-04 MED ADMIN — SALINE PERIPHERAL FLUSH Q8H 5 mL: @ 11:00:00 | NDC 87701099893

## 2009-05-04 MED ADMIN — midazolam (VERSED) injection 2-4 mg: INTRAVENOUS | @ 15:00:00 | NDC 63323041112

## 2009-05-04 MED ADMIN — albuterol/ipratropium (DUONEB) neb solution: RESPIRATORY_TRACT | @ 20:00:00 | NDC 00487980101

## 2009-05-04 MED ADMIN — nitroglycerin (NITROBID) 2 % ointment: NDC 00281032608

## 2009-05-04 MED ADMIN — pantoprazole (PROTONIX) injection 40 mg: INTRAVENOUS | @ 15:00:00 | NDC 00409488810

## 2009-05-04 MED ADMIN — midazolam (VERSED) injection 2 mg: INTRAVENOUS | @ 11:00:00 | NDC 63323041112

## 2009-05-04 MED ADMIN — piperacillin-tazobactam (ZOSYN) 3.375 g in 0.9% sodium chloride (MBP/ADV) 100 mL MBP: INTRAVENOUS | @ 03:00:00 | NDC 00338055318

## 2009-05-04 MED ADMIN — propofol (DIPRIVAN) infusion: INTRAVENOUS | @ 13:00:00 | NDC 63323027050

## 2009-05-04 MED ADMIN — metoprolol tartrate (LOPRESSOR) injection 5 mg: INTRAVENOUS | @ 22:00:00 | NDC 00143987310

## 2009-05-04 MED ADMIN — albuterol (PROVENTIL VENTOLIN) nebulizer solution 2.5 mg: RESPIRATORY_TRACT | @ 12:00:00 | NDC 00487950101

## 2009-05-04 MED ADMIN — midazolam (VERSED) injection 2-4 mg: INTRAVENOUS | @ 22:00:00 | NDC 10019002837

## 2009-05-04 MED ADMIN — ipratropium (ATROVENT) 0.02 % nebulizer solution 0.5 mg: RESPIRATORY_TRACT | @ 01:00:00 | NDC 00487980101

## 2009-05-04 MED ADMIN — piperacillin-tazobactam (ZOSYN) 2.25 g in 0.9% sodium chloride (MBP/ADV) 50 mL MBP: INTRAVENOUS | @ 21:00:00 | NDC 00338055311

## 2009-05-04 MED ADMIN — midazolam (VERSED) injection 2-4 mg: INTRAVENOUS | @ 17:00:00 | NDC 10019002837

## 2009-05-04 MED ADMIN — propofol (DIPRIVAN) infusion: INTRAVENOUS | @ 23:00:00 | NDC 63323027050

## 2009-05-04 MED FILL — LOVENOX 60 MG/0.6 ML SUBCUTANEOUS SYRINGE: 60 mg/0.6 mL | SUBCUTANEOUS | Qty: 1

## 2009-05-04 MED FILL — LOVENOX 40 MG/0.4 ML SUBCUTANEOUS SYRINGE: 40 mg/0.4 mL | SUBCUTANEOUS | Qty: 1

## 2009-05-04 MED FILL — PROPOFOL 10 MG/ML IV EMUL: 10 mg/mL | INTRAVENOUS | Qty: 100

## 2009-05-04 MED FILL — ASPIRIN 300 MG RECTAL SUPPOSITORY: 300 mg | RECTAL | Qty: 1

## 2009-05-04 MED FILL — PROPOFOL 10 MG/ML IV EMUL: 10 mg/mL | INTRAVENOUS | Qty: 50

## 2009-05-04 MED FILL — FUROSEMIDE 10 MG/ML IJ SOLN: 10 mg/mL | INTRAMUSCULAR | Qty: 4

## 2009-05-04 MED FILL — SODIUM CHLORIDE 0.45 % IV: 0.45 % | INTRAVENOUS | Qty: 1000

## 2009-05-04 MED FILL — ALBUTEROL SULFATE 0.083 % (0.83 MG/ML) SOLN FOR INHALATION: 2.5 mg /3 mL (0.083 %) | RESPIRATORY_TRACT | Qty: 1

## 2009-05-04 MED FILL — IPRATROPIUM BROMIDE 0.02 % SOLN FOR INHALATION: 0.02 % | RESPIRATORY_TRACT | Qty: 2.5

## 2009-05-04 MED FILL — ALBUTEROL SULFATE 0.083 % (0.83 MG/ML) SOLN FOR INHALATION: 2.5 mg /3 mL (0.083 %) | RESPIRATORY_TRACT | Qty: 3

## 2009-05-04 MED FILL — SALINE FLUSH INJECTION SYRINGE: INTRAMUSCULAR | Qty: 10

## 2009-05-04 MED FILL — HEPARIN LOCK FLUSH (PORCINE) (PF) 100 UNIT/ML INTRAVENOUS SYRINGE: 100 unit/mL | INTRAVENOUS | Qty: 5

## 2009-05-04 MED FILL — LACTULOSE 10 GRAM/15 ML ORAL SOLN: 10 gram/15 mL | ORAL | Qty: 300

## 2009-05-04 MED FILL — VANCOMYCIN 10 GRAM IV SOLR: 10 gram | INTRAVENOUS | Qty: 1.25

## 2009-05-04 MED FILL — SALINE FLUSH INJECTION SYRINGE: INTRAMUSCULAR | Qty: 30

## 2009-05-04 MED FILL — SODIUM CHLORIDE 0.9 % IV PIGGY BACK: INTRAVENOUS | Qty: 100

## 2009-05-04 MED FILL — IPRATROPIUM BROMIDE 0.02 % SOLN FOR INHALATION: 0.02 % | RESPIRATORY_TRACT | Qty: 7.5

## 2009-05-04 MED FILL — METOPROLOL TARTRATE 5 MG/5 ML IV SOLN: 5 mg/ mL | INTRAVENOUS | Qty: 5

## 2009-05-04 MED FILL — ACEPHEN 650 MG RECTAL SUPPOSITORY: 650 mg | RECTAL | Qty: 1

## 2009-05-04 MED FILL — MIDAZOLAM 1 MG/ML IJ SOLN: 1 mg/mL | INTRAMUSCULAR | Qty: 5

## 2009-05-04 MED FILL — SALINE FLUSH INJECTION SYRINGE: INTRAMUSCULAR | Qty: 40

## 2009-05-04 MED FILL — SODIUM CHLORIDE 0.9 % IV PIGGY BACK: INTRAVENOUS | Qty: 50

## 2009-05-04 MED FILL — KETOROLAC TROMETHAMINE 30 MG/ML INJECTION: 30 mg/mL (1 mL) | INTRAMUSCULAR | Qty: 1

## 2009-05-04 MED FILL — THIAMINE 100 MG/ML INJECTION: 100 mg/mL | INTRAMUSCULAR | Qty: 1

## 2009-05-04 MED FILL — MIDAZOLAM 1 MG/ML IJ SOLN: 1 mg/mL | INTRAMUSCULAR | Qty: 2

## 2009-05-04 MED FILL — NITRO-BID 2 % TRANSDERMAL OINTMENT: 2 % | TRANSDERMAL | Qty: 2

## 2009-05-04 MED FILL — PROPOFOL 10 MG/ML IV EMUL: 10 mg/mL | INTRAVENOUS | Qty: 200

## 2009-05-04 MED FILL — SODIUM CHLORIDE 0.9 % INJECTION: INTRAMUSCULAR | Qty: 10

## 2009-05-04 NOTE — Progress Notes (Signed)
 Formatting of this note might be different from the original.  Dr. Vonzell in to see patient, orders recvd.  Electronically signed by Elena Suzen ORN, RN at 05/04/2009 11:30 AM EDT

## 2009-05-04 NOTE — Progress Notes (Signed)
 Formatting of this note might be different from the original.  Awaiting x-ray for confirmation of PICC line placement to give antibiotics.  Not compatible with propofol.  Patient must stay sedated.  Electronically signed by Elena Suzen ORN, RN at 05/04/2009  4:04 PM EDT

## 2009-05-04 NOTE — Progress Notes (Signed)
 Formatting of this note might be different from the original.  Medicated with versed 2mg  IV for agitation.  Patient shaking head back and forth, vent alarming.  Electronically signed by Elena Suzen ORN, RN at 05/04/2009  1:24 PM EDT

## 2009-05-04 NOTE — Progress Notes (Signed)
 Formatting of this note might be different from the original.  Patient still with RR 30's on vent with 50mcg propofol.  Dr. Tamea in to see patient, versed 2mg  IV given.   Electronically signed by Elena Suzen ORN, RN at 05/04/2009 11:33 AM EDT

## 2009-05-04 NOTE — Progress Notes (Signed)
 Formatting of this note is different from the original.       Hospitalist Progress Note         NAME: Mekaylah Klich        DOB:  01/06/1961        MRN:  [id number]      Assessment & Plan   Sepsis syndrome :likely underlying pneumonia, fever, leukocytosis  Broad spectrum antibiotics.  No evidence of UTI or ascites(SBP), has an umbilical hernia that is not     reducible but overall adbomen is soft.    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     Subjective:     48yo caucasian female    Chief Complaint:  In ICU intubated with acute respiratory failure     Discussed with RN events overnight.     Review of Systems:  Fever/chills y   Cough N   Sputum    N   SOB/DOE  y   Chest Pain N   Abdominal Pain N   Diarrhea N   Constipation N   Nausea/Vomiting N   Dysuria N   Tolerating PT N   Tolerating Diet N   Could not obtain due to AMS vs intubation xx       Objective:   Patient intubated this AM with acute respiratory failure, febrile, tachycardic, leakage of troponin noted.    VITALS:   Last 24hrs VS reviewed since prior progress note. Most recent are:  Visit Vitals   Item Reading   ? BP 109/58   ? Pulse 123   ? Temp 102.6 F (39.2 C)   ? Resp 20   ? Ht 5' 2 (1.575 m)   ? Wt 162 lb  11.2 oz (73.8 kg)   ? SpO2 100%   ? PF 60L/min     Intake/Output Summary (Last 24 hours) at 05/04/09 0804  Last data filed at 05/04/09 0800   Gross per 24 hour   Intake 1259.16 ml   Output   2100 ml   Net -840.84 ml       Telemetry Reviewed:     PHYSICAL EXAM:  General: Intubated, sedated  HEENT: Pupils unequal, R 5mm, L 2mm  Lungs:  Bilateral rales, ronchi and mild wheezing.  Heart:  Regular rhythm, tachycardic  Abdomen: Soft, Non distended, hypoactive BS, no rebound, umbilical hernia non reducible  Extremities: No edema, pulses present  Neurologic: GCS [id number], moving extremities, but extensor posture to pain   Psych: sedated    Lab Data Reviewed: (see below)    Medications Reviewed: (see below)    PMH/SH reviewed - no change compared to H&P  ______________________________________________________________________  Total time spent with patient: 45 minutes     Critical Care Provided     Minutes non procedure based  Care Plan discussed with:  Patient    Family    RN x   Care Manager    Consultant/Specialist      >50% of visit spent in counseling and coordination of care       Prophylaxis:  GI PPI   DVT SCD     Disposition:   Home with Family    HH/PT/OT/RN    SNF/LTC    SA[facility]      ______________________________________________________________________  Attending Physician: Eric DOROTHA Deutscher, MD   _____________________________________________________________________________________________________  Procedures: see electronic medical records for all procedures/Xrays and details  which were not copied into this note but were reviewed prior to creation of Plan.      LABS:  Recent Labs   Basename 05/04/09 0325 05/03/09 0255   ? WBC 14.6* 12.4*   ? HGB 9.7* 9.2*   ? HCT 32.2* 30.3*   ? PLT 118* 123*     Recent Labs   Basename 05/04/09 0325 05/03/09 1533 05/03/09 0255 05/02/09 0345   ? NA 148* 162* 157* --   ? K 3.5 4.7 4.6 --   ? CL 116* 128* 120* --   ? CO2 20* 22 22 --   ? BUN 26* 24* 21* --   ? CREA 1.5* 1.3 1.2 --   ?  GLU 483* 108* 91 --   ? CA 8.3* 9.7 10.0 --   ? MG 2.2 -- 2.2 1.9   ? PHOS -- -- -- --   ? URICA -- -- -- --     Recent Labs   Basename 05/03/09 0255   ? SGOT 159*   ? GPT 61   ? AP 197*   ? TBIL 4.8*   ? TP 6.8   ? ALB 3.3*   ? GLOB 3.5   ? GGT --   ? AML --   ? LPSE 59*     No results found for this basename: INR:3,PTP:3,APTT:3, in the last 72 hours     No results found for this basename: FE:2,TIBC:2,PSAT:2,FERR:2, in the last 72 hours   Recent Labs   Basename 05/04/09 0603 05/03/09 1425   ? PH 7.40 7.37   ? PCO2 39 39   ? PO2 65* 53*     Recent Labs   Basename 05/04/09 0325 05/03/09 1533   ? CPK 4683* 3788*   ? CKMB -- --     Lab Results   Component Value Date/Time    POC GLUCOSE 114 05/01/2009  4:40 PM    POC GLUCOSE 162 05/01/2009 12:02 PM    POC GLUCOSE 115 05/01/2009  8:39 AM     Lab Results   Component Value Date/Time    Color YELLOW 04/30/2009 12:14 AM    Appearance CLEAR 04/30/2009 12:14 AM    Specific gravity 1.010 10/29/2008 11:00 AM    Specific gravity 1.011 04/30/2009 12:14 AM    pH 6.0 04/30/2009 12:14 AM    Protein NEGATIVE  04/30/2009 12:14 AM    Glucose NEGATIVE  04/30/2009 12:14 AM    Ketone NEGATIVE  04/30/2009 12:14 AM    Bilirubin NEGATIVE  04/30/2009 12:14 AM    Urobilinogen 2.0 04/30/2009 12:14 AM    Nitrites NEGATIVE  04/30/2009 12:14 AM    Leukocyte Esterase TRACE 04/30/2009 12:14 AM    Epithelial cells 10-20 04/30/2009 12:14 AM    Bacteria NEGATIVE  04/30/2009 12:14 AM    WBC 0-4 04/30/2009 12:14 AM    RBC 0-3 04/30/2009 12:14 AM  MEDICATIONS:  Current facility-administered medications   Medication Dose Route Frequency   ? furosemide (LASIX) injection 40 mg  40 mg IntraVENous ONCE   ? 0.45% sodium chloride  infusion    IntraVENous CONTINUOUS   ? aspirin (ASA) suppository 300 mg  300 mg Rectal DAILY   ? propofol (DIPRIVAN) 10 mg/mL injection        ? propofol (DIPRIVAN) infusion  5-50 mcg/kg/min IntraVENous TITRATE   ? midazolam (VERSED) injection 2 mg  2 mg IntraVENous Q2H PRN   ? morphine  injection 1 mg  1 mg  IntraVENous PRN   ? midazolam (VERSED) 1 mg/mL injection        ? pantoprazole  (PROTONIX ) injection 40 mg  40 mg IntraVENous DAILY   ? DISCONTD: pantoprazole  (PROTONIX ) injection 40 mg  40 mg IntraVENous DAILY   ? sodium chloride  0.9 % injection        ? piperacillin-tazobactam (ZOSYN) 3.375 g in 0.9% sodium chloride  (MBP/ADV) 100 mL MBP  3.375 g IntraVENous Q6H   ? lactulose (CHRONULAC) solution 200 g  300 mL Rectal BID   ? sodium chloride  0.9 % injection        ? acetaminophen  (TYLENOL ) suppository 650 mg  650 mg Rectal Q6H PRN   ? sodium chloride  0.9 % injection        ? lorazepam (ATIVAN) injection 1 mg  1 mg IntraVENous ONCE   ? vancomycin (VANCOCIN) 1.75 g in 0.9% sodium chloride  500 mL IVPB  1,750 mg IntraVENous ONCE   ? albuterol  (PROVENTIL  VENTOLIN ) nebulizer solution 2.5 mg  2.5 mg Nebulization QID   ? ipratropium (ATROVENT) 0.02 % nebulizer solution 0.5 mg  0.5 mg Nebulization QID   ? albuterol  (PROVENTIL  VENTOLIN ) nebulizer solution 2.5 mg  2.5 mg Nebulization Q2H PRN   ? thiamine (B-1) 100 mg in 0.9% sodium chloride  50 mL IVPB  100 mg IntraVENous DAILY   ? furosemide (LASIX) injection 40 mg  40 mg IntraVENous ONCE   ? nitroglycerin (NITROBID) 2 % ointment 2 Inch  2 Inch Topical Q6H PRN   ? ketorolac  (TORADOL ) injection 15 mg  15 mg IntraVENous NOW   ? DISCONTD: 0.45% sodium chloride  infusion    IntraVENous CONTINUOUS   ? DISCONTD: dextrose 5% infusion    IntraVENous CONTINUOUS   ? DISCONTD: ketorolac  (TORADOL ) injection 15 mg  15 mg IntraVENous Q6H PRN   ? DISCONTD: vancomycin (VANCOCIN) in 0.9% sodium chloride  250 mL infusion    IntraVENous    ? DISCONTD: vancomycin (VANCOCIN) 1.75 g in 0.9% sodium chloride  500 mL IVPB  1,750 mg IntraVENous ONCE   ? DISCONTD: vancomycin (VANCOCIN) 1.75 g in 0.9% sodium chloride  500 mL IVPB  1,750 mg IntraVENous ONCE   ? DISCONTD: dextrose 5% infusion    IntraVENous CONTINUOUS   ? DISCONTD: thiamine (B-1) injection 100 mg  100 mg IntraVENous DAILY   ? levofloxacin  (LEVAQUIN) 750 mg infusion   750 mg IntraVENous Q24H   ? DISCONTD: lorazepam (ATIVAN) injection 1 mg  1 mg IntraVENous Q6H PRN   ? DISCONTD: metronidazole (FLAGYL) IVPB 500 mg  500 mg IntraVENous Q8H   ? DISCONTD: haloperidol lactate (HALDOL) injection 1 mg  1 mg IntraVENous Q1H PRN   ? DISCONTD: acetaminophen  (TYLENOL ) suppository 650 mg  650 mg Rectal Q6H PRN   ? DISCONTD: HYDROmorphone  (PF) (DILAUDID ) injection 0.5 mg  0.5 mg IntraVENous Q4H PRN   ? enoxaparin  (LOVENOX ) injection 40 mg  40 mg SubCUTAneous Q24H   ? DISCONTD: lactulose (CHRONULAC)  solution 20 g  20 g Oral DAILY   ? SALINE PERIPHERAL FLUSH Q8H 5 mL  5 mL InterCATHeter Q8H   ? saline peripheral flush 5 mL  5 mL InterCATHeter PRN   ? DISCONTD: sertraline  (ZOLOFT ) tablet 100 mg  100 mg Oral DAILY       Electronically signed by Andris Eric PARAS, MD at 05/04/2009  8:28 AM EDT

## 2009-05-04 NOTE — Progress Notes (Signed)
 Formatting of this note might be different from the original.  Updated Dr Krystal on pt condition. Crackles throughout with exp wheezing on 50% oxygen facetent.  Resp in to give treatment and O2 increased to 70% via facetent. Order rec to give 40mg  iv lasix now.  Electronically signed by Olene Inocente BRAVO, RN at 05/04/2009  2:42 AM EDT

## 2009-05-04 NOTE — Progress Notes (Signed)
 Formatting of this note might be different from the original.  All IV tubing changed for use of PICC line.  Electronically signed by Elena Suzen ORN, RN at 05/04/2009  5:39 PM EDT

## 2009-05-04 NOTE — Consults (Signed)
 Associated Order(s): IP CONSULT TO NEUROLOGY  Formatting of this note might be different from the original.  Consult dictated; Pt with AMS and hepatic encephalopathy and pneumonia  Abn movements better on sedation  Impression; encephalopathy  Plan; prn ativan and EEG, and continue excellent medical support  Electronically signed by Claudene Debby LABOR, MD at 05/04/2009  1:36 PM EDT

## 2009-05-04 NOTE — Progress Notes (Signed)
 Formatting of this note might be different from the original.  Tylenol  suppository for temp 102 axillary.   Electronically signed by Elena Suzen ORN, RN at 05/04/2009  6:58 PM EDT

## 2009-05-04 NOTE — Progress Notes (Signed)
 Formatting of this note is different from the original.  TRANSFER - OUT REPORT:    Verbal report given to D. Poleon RN  on Breanna Lester  being transferred to CCU for urgent transfer       Report consisted of patient?s Situation, Background, Assessment and   Recommendations(SBAR).     Information from the following report(s) SBAR, Kardex, Intake/Output, MAR and Recent Results was reviewed with the receiving nurse.    Opportunity for questions and clarification was provided.        Electronically signed by Olene Inocente BRAVO, RN at 05/04/2009  6:45 AM EDT

## 2009-05-04 NOTE — Progress Notes (Signed)
 Formatting of this note might be different from the original.  Patient extremely restless, titrating propofol to make patient more comfortable.  Temp elevated at 102.6 ax, too early for tylenol , blood cultures to be drawn.  Hospitalist on floor at present.  Consult called to pulmonary and ABG drawn.  Mouth care completed. Skin jaundice, left arm wrapped with kling over splint from injury prior to hospitalization.  Bilateral wrist restraints in place for patient safety.  BP, HR stable at present.  Will continue to monitor.  Electronically signed by Elena Suzen ORN, RN at 05/04/2009  8:59 AM EDT

## 2009-05-04 NOTE — Progress Notes (Signed)
 Formatting of this note might be different from the original.  0600 Report received from PCU Nurse.   9379 Pt received in CCU room 2514, pt agitated, restless, unresponsive, unable to assess LOC, nonverbal;  in four point  Soft restraints x 4 ext, &  Vest restraint.,  Tachypneic, tachycardic,   On  NRB mask  @10  lpm oxygen, Jaundiced  Skin & sclerae bilat, febrile @ 102.2  Axillary,   IVF dextrose 5% @ 200 ml/hr,   Dr. Krystal @ the bedside, no new orders @ this time . Pt   To be intubated per Dr. Katheryne.  0630  Anesthesia @ bedside. Diprivan 5ml  IVP  given  Per anesthesia,  7.5  ET tube, 23 cm  @ teeth,  Breath sounds auscult bilat, will cont to monitor  0646 Dr. Krystal paged  Orders received: versed 2-4 mg q2 hr prn sedation/agitation, morphine  1-2 mg prn  For pain.   0700 report given to oncoming nurse.   Electronically signed by Poleon, Demavene, RN at 05/04/2009  9:04 AM EDT

## 2009-05-04 NOTE — Progress Notes (Signed)
 Formatting of this note might be different from the original.  Dr. Claudene in to see patient.    Electronically signed by Elena Suzen ORN, RN at 05/04/2009  1:29 PM EDT

## 2009-05-04 NOTE — Progress Notes (Signed)
 Formatting of this note might be different from the original.  Inserted triple lumen 6 F  PICC in right basilic at 40 cms with external length 0 cm, using Modified Seldinger Technique, Sherlock Tip Placement device, Ultrasound guidance, Lidocaine 1% used/yes and amount of lidocaine used/1.53mls ,maximum sterile barrier precautions observed.    Reason for access:reliable access/MD order    Complications related to insertion:none    Pt. Tolerated procedure well with minimal  blood loss. Sterile dressing applied with Biopatch, Stat loc and Tegaderm. Handout provided. Do not use until tip placement confirmed by chest xray.    Brand of catheter :Bard solo power, Lot Number:[id number]  Jori Hutchinson, RN, CRNI, [facility] nurse      Electronically signed by Hutchinson Jori RAMAN at 05/04/2009  3:48 PM EDT

## 2009-05-04 NOTE — Progress Notes (Signed)
 Formatting of this note might be different from the original.  Patient incontinent of moderate amount loose brown stool, excoriation noted around rectum, extra protective ointment applied.  Resting quietly, RR 28, will monitor.  Electronically signed by Elena Suzen ORN, RN at 05/04/2009  6:45 PM EDT

## 2009-05-04 NOTE — Procedures (Signed)
Intubation Note    Called to bedside secondary to  respiratory failure.      Patient pre-oxygenated with 100% oxygen.      Smooth RSI with propofol 50 mg  IV.    DVL x 1    7.5 ETT taped and secured at 22 cm at the teeth.    + Bilateral BS, + Chest rise, + ETCO2    VSS.  CXR pending.    Care turned over to covering CCU Attending MD.

## 2009-05-04 NOTE — Procedures (Signed)
 Formatting of this note might be different from the original.  Intubation Note    Called to bedside secondary to  respiratory failure.      Patient pre-oxygenated with 100% oxygen.      Smooth RSI with propofol 50 mg  IV.    DVL x 1    7.5 ETT taped and secured at 22 cm at the teeth.    + Bilateral BS, + Chest rise, + ETCO2    VSS.  CXR pending.    Care turned over to covering CCU Attending MD.      Electronically signed by Sheree Oneil PARAS, MD at 05/04/2009  6:32 AM EDT

## 2009-05-04 NOTE — Progress Notes (Signed)
 Formatting of this note might be different from the original.  2000 report received, febrile, vented, NAD, sedated, propofol gtt noted , OGT  to LWS,  finger , hard cast/splint on left arm , cap refill left arm < 3 seconds.  jaudiced skin & sclera bilat, opaque  pupils bilat; left pupil reactive, right pupil non-reactive & abnormally shaped , bilat soft  wrist restraints,  Excoriated area around/near rectum Will cont to monitor.   0000  continous rectal probe inserted, cooling blanket placed on standby,   0627  Temp @  100.4 rectally, Cooling blanket turned  On    0700 Dr. Andris @ the bedside. orders received. Report given to oncoming nurse.  Electronically signed by Poleon, Demavene, RN at 05/05/2009  8:34 AM EDT

## 2009-05-04 NOTE — Progress Notes (Signed)
 Formatting of this note might be different from the original.  Versed 2mg  IV for RR 30's, agitation.  Will monitor.  Electronically signed by Elena Suzen ORN, RN at 05/04/2009  5:35 PM EDT

## 2009-05-04 NOTE — Progress Notes (Signed)
 Formatting of this note might be different from the original.  Echocardiogram completed.  ONEIDA Gu, RDCS  Electronically signed by Gu Niemann C at 05/04/2009  1:21 PM EDT

## 2009-05-04 NOTE — Progress Notes (Signed)
 Formatting of this note might be different from the original.  Blood cultures sent per orders.  Patient still slightly restless, more relaxed than previously. Will monitor.  Electronically signed by Elena Suzen ORN, RN at 05/04/2009  9:36 AM EDT

## 2009-05-04 NOTE — Consults (Signed)
 Associated Order(s): IP CONSULT TO CARDIOLOGY  Formatting of this note is different from the original.   Subjective:     Date of  Admission: 04/29/2009 10:57 PM     Admission type:Emergency    Breanna Lester is a 48 y.o. female admitted for AMS, N/V, metabolic disarray, hepatic encephalopathy, probable pneumonia--now intubated, sedated on ventilator and in restraints.  We were asked to consult due to rising Troponin, CPK,and MB.  No other hx available from pt.  ?arm pain on admission, unsure if any chest pain or hx prior CAD.  EKG with non-specific changes (no ST elevation), Echo just done by tech and pending.    Problem List Never Reviewed      Class Noted    Nausea with vomiting [787.01]  05/01/2009      Hypokalemia [276.8A]  05/01/2009      Hepatic encephalopathy [572.2]  05/01/2009      Gait abnormality [781.2N]  05/01/2009      Alcoholic cirrhosis of liver [571.2]  05/01/2009      Hypomagnesemia [275.2B]  05/01/2009           BELYNDA COCKING, MD  Past Medical History   Diagnosis Date   ? Liver disease 2009     liver failure   ? Depression    ? Anxiety        Past Surgical History   Procedure Date   ? Hx tonsillectomy 1972   ? Hx adenoidectomy 1972   ? Hx cesarean section 1986 and 1993   ? Hx hysterectomy 2008   ? Hx dilation and curettage 1999     Allergies   Allergen Reactions   ? Latex, Natural Rubber Rash       Family History   Problem Relation Age of Onset   ? Cancer Mother    ? Heart Disease Father        Current facility-administered medications   Medication Dose Route Frequency   ? furosemide  (LASIX ) injection 40 mg  40 mg IntraVENous ONCE   ? aspirin (ASA) suppository 300 mg  300 mg Rectal DAILY   ? propofol  (DIPRIVAN ) infusion  5-50 mcg/kg/min IntraVENous TITRATE   ? morphine  injection 1 mg  1 mg IntraVENous PRN   ? pantoprazole  (PROTONIX ) injection 40 mg  40 mg IntraVENous DAILY   ? levofloxacin (LEVAQUIN) 750 mg infusion   750 mg IntraVENous Q48H   ? piperacillin-tazobactam (ZOSYN) 2.25 g in 0.9% sodium chloride   (MBP/ADV) 50 mL MBP  2.25 g IntraVENous Q8H   ? 0.45% sodium chloride  infusion    IntraVENous CONTINUOUS   ? enoxaparin  (LOVENOX ) injection 40 mg  40 mg SubCUTAneous DAILY   ? albuterol /ipratropium (DUONEB ) neb solution   1 Dose Nebulization Q4HWA RT   ? vancomycin (VANCOCIN) 1.25 g in 0.9% sodium chloride  250 mL IVPB  1,250 mg IntraVENous Q24H   ? midazolam (VERSED) injection 2-4 mg  2-4 mg IntraVENous Q1H PRN   ? DISCONTD: 0.45% sodium chloride  infusion    IntraVENous CONTINUOUS   ? DISCONTD: midazolam (VERSED) injection 2 mg  2 mg IntraVENous Q2H PRN   ? DISCONTD: midazolam (VERSED) 1 mg/mL injection        ? DISCONTD: pantoprazole  (PROTONIX ) injection 40 mg  40 mg IntraVENous DAILY   ? lactulose (CHRONULAC) solution 200 g  300 mL Rectal BID   ? sodium chloride  0.9 % injection        ? acetaminophen  (TYLENOL ) suppository 650 mg  650 mg Rectal Q6H PRN   ?  sodium chloride  0.9 % injection        ? lorazepam (ATIVAN) injection 1 mg  1 mg IntraVENous ONCE   ? vancomycin (VANCOCIN) 1.75 g in 0.9% sodium chloride  500 mL IVPB  1,750 mg IntraVENous ONCE   ? albuterol  (PROVENTIL  VENTOLIN ) nebulizer solution 2.5 mg  2.5 mg Nebulization Q2H PRN   ? thiamine (B-1) 100 mg in 0.9% sodium chloride  50 mL IVPB  100 mg IntraVENous DAILY   ? furosemide (LASIX) injection 40 mg  40 mg IntraVENous ONCE   ? nitroglycerin (NITROBID) 2 % ointment 2 Inch  2 Inch Topical Q6H PRN   ? ketorolac  (TORADOL ) injection 15 mg  15 mg IntraVENous NOW   ? DISCONTD: 0.45% sodium chloride  infusion    IntraVENous CONTINUOUS   ? DISCONTD: piperacillin-tazobactam (ZOSYN) 3.375 g in 0.9% sodium chloride  (MBP/ADV) 100 mL MBP  3.375 g IntraVENous Q6H   ? DISCONTD: dextrose 5% infusion    IntraVENous CONTINUOUS   ? DISCONTD: ketorolac  (TORADOL ) injection 15 mg  15 mg IntraVENous Q6H PRN   ? DISCONTD: vancomycin (VANCOCIN) in 0.9% sodium chloride  250 mL infusion    IntraVENous    ? DISCONTD: vancomycin (VANCOCIN) 1.75 g in 0.9% sodium chloride  500 mL IVPB   1,750 mg IntraVENous ONCE   ? DISCONTD: vancomycin (VANCOCIN) 1.75 g in 0.9% sodium chloride  500 mL IVPB  1,750 mg IntraVENous ONCE   ? DISCONTD: dextrose 5% infusion    IntraVENous CONTINUOUS   ? DISCONTD: albuterol  (PROVENTIL  VENTOLIN ) nebulizer solution 2.5 mg  2.5 mg Nebulization QID   ? DISCONTD: ipratropium (ATROVENT) 0.02 % nebulizer solution 0.5 mg  0.5 mg Nebulization QID   ? DISCONTD: thiamine (B-1) injection 100 mg  100 mg IntraVENous DAILY   ? DISCONTD: lorazepam (ATIVAN) injection 1 mg  1 mg IntraVENous Q6H PRN   ? DISCONTD: levofloxacin (LEVAQUIN) 750 mg infusion   750 mg IntraVENous Q24H   ? DISCONTD: metronidazole (FLAGYL) IVPB 500 mg  500 mg IntraVENous Q8H   ? DISCONTD: haloperidol lactate (HALDOL) injection 1 mg  1 mg IntraVENous Q1H PRN   ? DISCONTD: acetaminophen  (TYLENOL ) suppository 650 mg  650 mg Rectal Q6H PRN   ? DISCONTD: HYDROmorphone  (PF) (DILAUDID ) injection 0.5 mg  0.5 mg IntraVENous Q4H PRN   ? DISCONTD: enoxaparin  (LOVENOX ) injection 40 mg  40 mg SubCUTAneous Q24H   ? DISCONTD: lactulose (CHRONULAC) solution 20 g  20 g Oral DAILY   ? SALINE PERIPHERAL FLUSH Q8H 5 mL  5 mL InterCATHeter Q8H   ? saline peripheral flush 5 mL  5 mL InterCATHeter PRN   ? DISCONTD: sertraline  (ZOLOFT ) tablet 100 mg  100 mg Oral DAILY          Review of Symptoms:  Review of systems not obtained due to patient factors.     Subjective:       Physical Exam    Intubated, sedated on vent and restraints  HEENT--ET in place  Chest--occasional scattered rhonchi  CV --tachy S1S2 II/VI sem  ABD--Nl BS  EXT--without edema    Cardiographics    Telemetry: sinus tachycardia, PAC's    ECG: sinus tach, non-specific ST T changes  Echocardiogram:pending    Labs: Recent Results (from the past 24 hour(s))   BLOOD GAS, ARTERIAL    Collection Time    05/03/09  2:25 PM   Component Value Range   ? pH 7.37  7.35 - 7.45 ( )   ? PCO2 39  35 - 45 (mmHg)   ?  PO2 53 (*) 80 - 100 (mmHg)   ? O2 SAT 86 (*) 92 - 97 (%)   ? BICARBONATE 22  22  - 26 (mmol/L)   ? BASE DEFICIT 3.2 (*) BE NORMAL RANGE -3 T (mmol/L)   ? O2 FLOW RATE 2.5  (L/min)   ? SITE RB     ? SPONTANEOUS RATE 38  ( )   ? SAMPLE SOURCE ARTERIAL     ? O2 METHOD NASAL O2     METABOLIC PANEL, BASIC    Collection Time    05/03/09  3:33 PM   Component Value Range   ? Sodium 162 (*) 136 - 145 (MMOL/L)   ? Potassium 4.7  3.5 - 5.1 (MMOL/L)   ? Chloride 128 (*) 97 - 108 (MMOL/L)   ? CO2 22  21 - 32 (MMOL/L)   ? Anion gap 12  5 - 15 (mmol/L)   ? Glucose 108 (*) 65 - 100 (MG/DL)   ? BUN 24 (*) 6 - 20 (MG/DL)   ? Creatinine 1.3  0.6 - 1.3 (MG/DL)   ? BUN/Creatinine ratio 18  12 - 20 ( )   ? GFR est AA 56 (*) >60 (ml/min/1.37m2)   ? GFR est non-AA 46 (*) >60 (ml/min/1.46m2)   ? Calcium 9.7  8.5 - 10.1 (MG/DL)   CK W/ CKMB & INDEX    Collection Time    05/03/09  3:33 PM   Component Value Range   ? CK 3788 (*) 21 - 215 (U/L)   ? CK - MB 26.4 (*) 0.5 - 3.6 (NG/ML)   ? CK-MB Index 0.7  0 - 2.5 ( )   TROPONIN I    Collection Time    05/03/09  3:33 PM   Component Value Range   ? Troponin-I, Qt. 0.69 (*) <0.05 (ng/mL)   CULTURE, BLOOD    Collection Time    05/03/09  3:33 PM   Component Value Range   ? Specimen Description: BLOOD     ? Special Requests: NO SPECIAL REQUESTS     ? Culture result: NO GROWTH AFTER 15 HOURS     ? Report Status PENDING     METABOLIC PANEL, BASIC    Collection Time    05/04/09  3:25 AM   Component Value Range   ? Sodium 148 (*) 136 - 145 (MMOL/L)   ? Potassium 3.5  3.5 - 5.1 (MMOL/L)   ? Chloride 116 (*) 97 - 108 (MMOL/L)   ? CO2 20 (*) 21 - 32 (MMOL/L)   ? Anion gap 12  5 - 15 (mmol/L)   ? Glucose 483 (*) 65 - 100 (MG/DL)   ? BUN 26 (*) 6 - 20 (MG/DL)   ? Creatinine 1.5 (*) 0.6 - 1.3 (MG/DL)   ? BUN/Creatinine ratio 17  12 - 20 ( )   ? GFR est AA 48 (*) >60 (ml/min/1.71m2)   ? GFR est non-AA 39 (*) >60 (ml/min/1.58m2)   ? Calcium 8.3 (*) 8.5 - 10.1 (MG/DL)   MAGNESIUM     Collection Time    05/04/09  3:25 AM   Component Value Range   ? Magnesium  2.2  1.6 - 2.4 (MG/DL)   CBC WITH AUTOMATED  DIFF    Collection Time    05/04/09  3:25 AM   Component Value Range   ? WBC 14.6 (*) 3.6 - 11.0 (K/uL)   ? RBC 3.69 (*) 3.80 - 5.20 (M/uL)   ? HGB 9.7 (*)  11.5 - 16.0 (g/dL)   ? HCT 32.2 (*) 35.0 - 47.0 (%)   ? MCV 87.3  80.0 - 99.0 (FL)   ? MCH 26.3  26.0 - 34.0 (PG)   ? MCHC 30.1  30.0 - 36.5 (g/dL)   ? RDW 18.7 (*) 11.5 - 14.5 (%)   ? PLATELET 118 (*) 150 - 400 (K/uL)   ? NEUTROPHILS 84 (*) 32 - 75 (%)   ? LYMPHOCYTES 6 (*) 12 - 49 (%)   ? MONOCYTES 10  5 - 13 (%)   ? EOSINOPHILS 0  0 - 7 (%)   ? BASOPHILS 0  0 - 1 (%)   ? ABSOLUTE NEUTS 12.2 (*) 1.8 - 8.0 (K/UL)   ? ABSOLUTE LYMPHS 0.9  0.8 - 3.5 (K/UL)   ? ABSOLUTE MONOS 1.5 (*) 0.0 - 1.0 (K/UL)   ? ABSOLUTE EOSINS 0.0  0.0 - 0.4 (K/UL)   ? ABSOLUTE BASOS 0.0  0.0 - 0.1 (K/UL)   ? DF SMEAR SCANNED     ? RBC COMMENTS        Value: 1+ ANISOCYTOSIS      1+ POLYCHROMASIA   AMMONIA    Collection Time    05/04/09  3:25 AM   Component Value Range   ? Ammonia 64 (*) <32 (UMOL/L)   CK W/ CKMB & INDEX    Collection Time    05/04/09  3:25 AM   Component Value Range   ? CK 4683 (*) 21 - 215 (U/L)   ? CK - MB 23.2 (*) 0.5 - 3.6 (NG/ML)   ? CK-MB Index 0.5  0 - 2.5 ( )   TROPONIN I    Collection Time    05/04/09  3:25 AM   Component Value Range   ? Troponin-I, Qt. 9.10 (*) <0.05 (ng/mL)   PRO-BNP    Collection Time    05/04/09  3:25 AM   Component Value Range   ? NT pro-BNP 24113 (*) 0 - 125 (PG/ML)   BLOOD GAS, ARTERIAL    Collection Time    05/04/09  6:03 AM   Component Value Range   ? pH 7.40  7.35 - 7.45 ( )   ? PCO2 39  35 - 45 (mmHg)   ? PO2 65 (*) 80 - 100 (mmHg)   ? O2 SAT 93  92 - 97 (%)   ? BICARBONATE 24  22 - 26 (mmol/L)   ? BASE DEFICIT 1.0 (*) BE NORMAL RANGE -3 T (mmol/L)   ? FIO2 70  (%)   ? SITE RB     ? SPONTANEOUS RATE 36  ( )   ? SAMPLE SOURCE ARTERIAL     ? O2 METHOD FT     BLOOD GAS, ARTERIAL    Collection Time    05/04/09  8:15 AM   Component Value Range   ? pH 7.47 (*) 7.35 - 7.45 ( )   ? PCO2 31 (*) 35 - 45 (mmHg)   ? PO2 507 (*) 80 - 100 (mmHg)   ? O2 SAT  100 (*) 92 - 97 (%)   ? BICARBONATE 22  22 - 26 (mmol/L)   ? BASE DEFICIT 0.7 (*) BE NORMAL RANGE -3 T (mmol/L)   ? FIO2 100  (%)   ? SITE RR     ? MODE A/C     ? SET RATE 14  ( )   ? SPONTANEOUS RATE 30  ( )   ?  VT/PIP 600  ( )   ? PEEP/CPAP 5  ( )   ? SAMPLE SOURCE ARTERIAL     ? O2 METHOD VENTILATOR        Assessment:     Assessment:      Patient Active Hospital Problem List:  Nausea with vomiting (05/01/2009)    Hypokalemia (05/01/2009)    Hepatic encephalopathy (05/01/2009)    Gait abnormality (05/01/2009)    Alcoholic cirrhosis of liver (05/01/2009)    Hypomagnesemia (05/01/2009)    Hypoxic respiratory failure--intubated/sedated/ on vent    NSTEMI--trop rising to 9, CPK MB 23, no ST elevation on EKG reviewed    Probable pneumonia   Plan:     Echo (bedside)--done and will review  ASA supp  Increase Lovenox  to 1mg /kg SQ q12h  IV metoprolol   Consider Plavix--?risks of bleeding, hepatic encephalopathy, etc  Continue vent support  Cath at some point after initially stablizes    Electronically signed by Vonzell Norleen ORN, MD at 05/04/2009 11:36 AM EDT

## 2009-05-04 NOTE — Progress Notes (Signed)
 Formatting of this note might be different from the original.  Patient incontinent of moderate to large thick brown liquid BM after lactulose given per rectum.  Peri care given.   Electronically signed by Elena Suzen ORN, RN at 05/04/2009  6:01 PM EDT

## 2009-05-04 NOTE — Progress Notes (Signed)
 Formatting of this note might be different from the original.  Pharmacy Note    Vancomycin Dosing Day #1    NT is a 48YOF who has been moved to critical care from oncology.  Height 63     TBW 73.8 kg      IBW 52.4 kg    AdjBW 61kg  WBC 14.6     Tmax 102.6   SCr 1.5          CrCl ~ 37.9    Patient was given a loading dose of vancomycin 1750 mg IV x 1 on 05/03/09 at 1800.  Patient will start vancomycin 1250 mg IV Q24h (expected trough 15.2), first dose due 05/04/09 at 1800.  Goal 15-20.    Patient is also on levofloxacin 750 mg IV x 3 days and will begin zosyn 2.25 g IV Q8h on 05/04/09.    Awaiting culture results.  Continue to monitor renal function, WBC, and temperature.    Pharmacy will continue to follow.  Electronically signed by Gerome Montey PARAS, RPH at 05/04/2009 11:11 AM EDT

## 2009-05-04 NOTE — Progress Notes (Signed)
Blood cultures sent per orders.  Patient still slightly restless, more relaxed than previously. Will monitor.

## 2009-05-04 NOTE — Progress Notes (Signed)
Medicated with versed 2mg  IV for agitation.  Patient shaking head back and forth, vent alarming.

## 2009-05-04 NOTE — Procedures (Signed)
Intubation Note    Called to bedside secondary to  respiratory failure.      Patient pre-oxygenated with 100% oxygen.      Smooth RSI with propofol 50 mg  IV.    DVL x 1    7.5 ETT taped and secured at 22 cm at the teeth.    + Bilateral BS, + Chest rise, + ETCO2    VSS.  CXR pending.    Care turned over to covering CCU Attending MD.

## 2009-05-04 NOTE — Progress Notes (Signed)
Inserted triple lumen 6 F  PICC in right basilic at 40 cms with external length 0 cm, using Modified Seldinger Technique, Sherlock Tip Placement device, Ultrasound guidance, Lidocaine 1% used/yes and amount of lidocaine used/1.30mls ,maximum sterile barrier precautions observed.    Reason for access:reliable access/MD order    Complications related to insertion:none    Pt. Tolerated procedure well with minimal  blood loss. Sterile dressing applied with Biopatch, Stat loc and Tegaderm. Handout provided. Do not use until tip placement confirmed by chest xray.    Brand of catheter :Bard solo power, Lot Number:[id number]  Sabino Gasser, RN, CRNI, PICC nurse

## 2009-05-04 NOTE — Consults (Signed)
Subjective:      Date of  Admission: 04/29/2009 10:57 PM     Admission type:Emergency    Breanna Lester is a 48 y.o. female admitted for AMS, N/V, metabolic disarray, hepatic encephalopathy, probable pneumonia--now intubated, sedated on ventilator and in restraints.  We were asked to consult due to rising Troponin, CPK,and MB.  No other hx available from pt.  ?arm pain on admission, unsure if any chest pain or hx prior CAD.  EKG with non-specific changes (no ST elevation), Echo just done by tech and pending.    Problem List Never Reviewed      Class Noted    Nausea with vomiting [787.01]  05/01/2009        Hypokalemia [276.8A]  05/01/2009        Hepatic encephalopathy [572.2]  05/01/2009        Gait abnormality [781.2N]  05/01/2009        Alcoholic cirrhosis of liver [571.2]  05/01/2009        Hypomagnesemia [275.2B]  05/01/2009               Charlies Constable, MD  Past Medical History   Diagnosis Date   ??? Liver disease 2009     liver failure   ??? Depression    ??? Anxiety         Past Surgical History   Procedure Date   ??? Hx tonsillectomy 1972   ??? Hx adenoidectomy 1972   ??? Hx cesarean section 1986 and 1993   ??? Hx hysterectomy 2008   ??? Hx dilation and curettage 1999       Allergies   Allergen Reactions   ??? Latex, Natural Rubber Rash        Family History   Problem Relation Age of Onset   ??? Cancer Mother    ??? Heart Disease Father         Current facility-administered medications   Medication Dose Route Frequency   ??? furosemide (LASIX) injection 40 mg  40 mg IntraVENous ONCE   ??? aspirin (ASA) suppository 300 mg  300 mg Rectal DAILY   ??? propofol (DIPRIVAN) infusion  5-50 mcg/kg/min IntraVENous TITRATE   ??? morphine injection 1 mg  1 mg IntraVENous PRN   ??? pantoprazole (PROTONIX) injection 40 mg  40 mg IntraVENous DAILY   ??? levofloxacin (LEVAQUIN) 750 mg infusion   750 mg IntraVENous Q48H   ??? piperacillin-tazobactam (ZOSYN) 2.25 g in 0.9% sodium chloride (MBP/ADV) 50 mL MBP  2.25 g IntraVENous Q8H    ??? 0.45% sodium chloride infusion    IntraVENous CONTINUOUS   ??? enoxaparin (LOVENOX) injection 40 mg  40 mg SubCUTAneous DAILY   ??? albuterol/ipratropium (DUONEB) neb solution   1 Dose Nebulization Q4HWA RT   ??? vancomycin (VANCOCIN) 1.25 g in 0.9% sodium chloride 250 mL IVPB  1,250 mg IntraVENous Q24H   ??? midazolam (VERSED) injection 2-4 mg  2-4 mg IntraVENous Q1H PRN   ??? DISCONTD: 0.45% sodium chloride infusion    IntraVENous CONTINUOUS   ??? DISCONTD: midazolam (VERSED) injection 2 mg  2 mg IntraVENous Q2H PRN   ??? DISCONTD: midazolam (VERSED) 1 mg/mL injection        ??? DISCONTD: pantoprazole (PROTONIX) injection 40 mg  40 mg IntraVENous DAILY   ??? lactulose (CHRONULAC) solution 200 g  300 mL Rectal BID   ??? sodium chloride 0.9 % injection        ??? acetaminophen (TYLENOL) suppository 650 mg  650 mg Rectal Q6H  PRN   ??? sodium chloride 0.9 % injection        ??? lorazepam (ATIVAN) injection 1 mg  1 mg IntraVENous ONCE   ??? vancomycin (VANCOCIN) 1.75 g in 0.9% sodium chloride 500 mL IVPB  1,750 mg IntraVENous ONCE   ??? albuterol (PROVENTIL VENTOLIN) nebulizer solution 2.5 mg  2.5 mg Nebulization Q2H PRN   ??? thiamine (B-1) 100 mg in 0.9% sodium chloride 50 mL IVPB  100 mg IntraVENous DAILY   ??? furosemide (LASIX) injection 40 mg  40 mg IntraVENous ONCE   ??? nitroglycerin (NITROBID) 2 % ointment 2 Inch  2 Inch Topical Q6H PRN   ??? ketorolac (TORADOL) injection 15 mg  15 mg IntraVENous NOW   ??? DISCONTD: 0.45% sodium chloride infusion    IntraVENous CONTINUOUS   ??? DISCONTD: piperacillin-tazobactam (ZOSYN) 3.375 g in 0.9% sodium chloride (MBP/ADV) 100 mL MBP  3.375 g IntraVENous Q6H   ??? DISCONTD: dextrose 5% infusion    IntraVENous CONTINUOUS   ??? DISCONTD: ketorolac (TORADOL) injection 15 mg  15 mg IntraVENous Q6H PRN   ??? DISCONTD: vancomycin (VANCOCIN) in 0.9% sodium chloride 250 mL infusion    IntraVENous    ??? DISCONTD: vancomycin (VANCOCIN) 1.75 g in 0.9% sodium chloride 500 mL IVPB  1,750 mg IntraVENous ONCE    ??? DISCONTD: vancomycin (VANCOCIN) 1.75 g in 0.9% sodium chloride 500 mL IVPB  1,750 mg IntraVENous ONCE   ??? DISCONTD: dextrose 5% infusion    IntraVENous CONTINUOUS   ??? DISCONTD: albuterol (PROVENTIL VENTOLIN) nebulizer solution 2.5 mg  2.5 mg Nebulization QID   ??? DISCONTD: ipratropium (ATROVENT) 0.02 % nebulizer solution 0.5 mg  0.5 mg Nebulization QID   ??? DISCONTD: thiamine (B-1) injection 100 mg  100 mg IntraVENous DAILY   ??? DISCONTD: lorazepam (ATIVAN) injection 1 mg  1 mg IntraVENous Q6H PRN   ??? DISCONTD: levofloxacin (LEVAQUIN) 750 mg infusion   750 mg IntraVENous Q24H   ??? DISCONTD: metronidazole (FLAGYL) IVPB 500 mg  500 mg IntraVENous Q8H   ??? DISCONTD: haloperidol lactate (HALDOL) injection 1 mg  1 mg IntraVENous Q1H PRN   ??? DISCONTD: acetaminophen (TYLENOL) suppository 650 mg  650 mg Rectal Q6H PRN   ??? DISCONTD: HYDROmorphone (PF) (DILAUDID) injection 0.5 mg  0.5 mg IntraVENous Q4H PRN   ??? DISCONTD: enoxaparin (LOVENOX) injection 40 mg  40 mg SubCUTAneous Q24H   ??? DISCONTD: lactulose (CHRONULAC) solution 20 g  20 g Oral DAILY   ??? SALINE PERIPHERAL FLUSH Q8H 5 mL  5 mL InterCATHeter Q8H   ??? saline peripheral flush 5 mL  5 mL InterCATHeter PRN   ??? DISCONTD: sertraline (ZOLOFT) tablet 100 mg  100 mg Oral DAILY           Review of Symptoms:  Review of systems not obtained due to patient factors.     Subjective:          Physical Exam    Intubated, sedated on vent and restraints  HEENT--ET in place  Chest--occasional scattered rhonchi  CV --tachy S1S2 II/VI sem  ABD--Nl BS  EXT--without edema    Cardiographics    Telemetry: sinus tachycardia, PAC's    ECG: sinus tach, non-specific ST T changes  Echocardiogram:pending    Labs: Recent Results (from the past 24 hour(s))   BLOOD GAS, ARTERIAL    Collection Time    05/03/09  2:25 PM   Component Value Range   ??? pH 7.37  7.35 - 7.45 ( )   ???  PCO2 39  35 - 45 (mmHg)   ??? PO2 53 (*) 80 - 100 (mmHg)   ??? O2 SAT 86 (*) 92 - 97 (%)   ??? BICARBONATE 22  22 - 26 (mmol/L)    ??? BASE DEFICIT 3.2 (*) BE NORMAL RANGE -3 T (mmol/L)   ??? O2 FLOW RATE 2.5  (L/min)   ??? SITE RB     ??? SPONTANEOUS RATE 38  ( )   ??? SAMPLE SOURCE ARTERIAL     ??? O2 METHOD NASAL O2     METABOLIC PANEL, BASIC    Collection Time    05/03/09  3:33 PM   Component Value Range   ??? Sodium 162 (*) 136 - 145 (MMOL/L)   ??? Potassium 4.7  3.5 - 5.1 (MMOL/L)   ??? Chloride 128 (*) 97 - 108 (MMOL/L)   ??? CO2 22  21 - 32 (MMOL/L)   ??? Anion gap 12  5 - 15 (mmol/L)   ??? Glucose 108 (*) 65 - 100 (MG/DL)   ??? BUN 24 (*) 6 - 20 (MG/DL)   ??? Creatinine 1.3  0.6 - 1.3 (MG/DL)   ??? BUN/Creatinine ratio 18  12 - 20 ( )   ??? GFR est AA 56 (*) >60 (ml/min/1.3m2)   ??? GFR est non-AA 46 (*) >60 (ml/min/1.68m2)   ??? Calcium 9.7  8.5 - 10.1 (MG/DL)   CK W/ CKMB & INDEX    Collection Time    05/03/09  3:33 PM   Component Value Range   ??? CK 3788 (*) 21 - 215 (U/L)   ??? CK - MB 26.4 (*) 0.5 - 3.6 (NG/ML)   ??? CK-MB Index 0.7  0 - 2.5 ( )   TROPONIN I    Collection Time    05/03/09  3:33 PM   Component Value Range   ??? Troponin-I, Qt. 0.69 (*) <0.05 (ng/mL)   CULTURE, BLOOD    Collection Time    05/03/09  3:33 PM   Component Value Range   ??? Specimen Description: BLOOD     ??? Special Requests: NO SPECIAL REQUESTS     ??? Culture result: NO GROWTH AFTER 15 HOURS     ??? Report Status PENDING     METABOLIC PANEL, BASIC    Collection Time    05/04/09  3:25 AM   Component Value Range   ??? Sodium 148 (*) 136 - 145 (MMOL/L)   ??? Potassium 3.5  3.5 - 5.1 (MMOL/L)   ??? Chloride 116 (*) 97 - 108 (MMOL/L)   ??? CO2 20 (*) 21 - 32 (MMOL/L)   ??? Anion gap 12  5 - 15 (mmol/L)   ??? Glucose 483 (*) 65 - 100 (MG/DL)   ??? BUN 26 (*) 6 - 20 (MG/DL)   ??? Creatinine 1.5 (*) 0.6 - 1.3 (MG/DL)   ??? BUN/Creatinine ratio 17  12 - 20 ( )   ??? GFR est AA 48 (*) >60 (ml/min/1.55m2)   ??? GFR est non-AA 39 (*) >60 (ml/min/1.75m2)   ??? Calcium 8.3 (*) 8.5 - 10.1 (MG/DL)   MAGNESIUM    Collection Time    05/04/09  3:25 AM   Component Value Range   ??? Magnesium 2.2  1.6 - 2.4 (MG/DL)   CBC WITH AUTOMATED DIFF     Collection Time    05/04/09  3:25 AM   Component Value Range   ??? WBC 14.6 (*) 3.6 - 11.0 (K/uL)   ??? RBC 3.69 (*)  3.80 - 5.20 (M/uL)   ??? HGB 9.7 (*) 11.5 - 16.0 (g/dL)   ??? HCT 32.2 (*) 35.0 - 47.0 (%)   ??? MCV 87.3  80.0 - 99.0 (FL)   ??? MCH 26.3  26.0 - 34.0 (PG)   ??? MCHC 30.1  30.0 - 36.5 (g/dL)   ??? RDW 18.7 (*) 11.5 - 14.5 (%)   ??? PLATELET 118 (*) 150 - 400 (K/uL)   ??? NEUTROPHILS 84 (*) 32 - 75 (%)   ??? LYMPHOCYTES 6 (*) 12 - 49 (%)   ??? MONOCYTES 10  5 - 13 (%)   ??? EOSINOPHILS 0  0 - 7 (%)   ??? BASOPHILS 0  0 - 1 (%)   ??? ABSOLUTE NEUTS 12.2 (*) 1.8 - 8.0 (K/UL)   ??? ABSOLUTE LYMPHS 0.9  0.8 - 3.5 (K/UL)   ??? ABSOLUTE MONOS 1.5 (*) 0.0 - 1.0 (K/UL)   ??? ABSOLUTE EOSINS 0.0  0.0 - 0.4 (K/UL)   ??? ABSOLUTE BASOS 0.0  0.0 - 0.1 (K/UL)   ??? DF SMEAR SCANNED     ??? RBC COMMENTS        Value: 1+ ANISOCYTOSIS      1+ POLYCHROMASIA   AMMONIA    Collection Time    05/04/09  3:25 AM   Component Value Range   ??? Ammonia 64 (*) <32 (UMOL/L)   CK W/ CKMB & INDEX    Collection Time    05/04/09  3:25 AM   Component Value Range   ??? CK 4683 (*) 21 - 215 (U/L)   ??? CK - MB 23.2 (*) 0.5 - 3.6 (NG/ML)   ??? CK-MB Index 0.5  0 - 2.5 ( )   TROPONIN I    Collection Time    05/04/09  3:25 AM   Component Value Range   ??? Troponin-I, Qt. 9.10 (*) <0.05 (ng/mL)   PRO-BNP    Collection Time    05/04/09  3:25 AM   Component Value Range   ??? NT pro-BNP 24113 (*) 0 - 125 (PG/ML)   BLOOD GAS, ARTERIAL    Collection Time    05/04/09  6:03 AM   Component Value Range   ??? pH 7.40  7.35 - 7.45 ( )   ??? PCO2 39  35 - 45 (mmHg)   ??? PO2 65 (*) 80 - 100 (mmHg)   ??? O2 SAT 93  92 - 97 (%)   ??? BICARBONATE 24  22 - 26 (mmol/L)   ??? BASE DEFICIT 1.0 (*) BE NORMAL RANGE -3 T (mmol/L)   ??? FIO2 70  (%)   ??? SITE RB     ??? SPONTANEOUS RATE 36  ( )   ??? SAMPLE SOURCE ARTERIAL     ??? O2 METHOD FT     BLOOD GAS, ARTERIAL    Collection Time    05/04/09  8:15 AM   Component Value Range   ??? pH 7.47 (*) 7.35 - 7.45 ( )   ??? PCO2 31 (*) 35 - 45 (mmHg)   ??? PO2 507 (*) 80 - 100 (mmHg)    ??? O2 SAT 100 (*) 92 - 97 (%)   ??? BICARBONATE 22  22 - 26 (mmol/L)   ??? BASE DEFICIT 0.7 (*) BE NORMAL RANGE -3 T (mmol/L)   ??? FIO2 100  (%)   ??? SITE RR     ??? MODE A/C     ??? SET RATE 14  ( )   ???  SPONTANEOUS RATE 30  ( )   ??? VT/PIP 600  ( )   ??? PEEP/CPAP 5  ( )   ??? SAMPLE SOURCE ARTERIAL     ??? O2 METHOD VENTILATOR            Assessment:     Assessment:       Patient Active Hospital Problem List:  Nausea with vomiting (05/01/2009)    Hypokalemia (05/01/2009)    Hepatic encephalopathy (05/01/2009)    Gait abnormality (05/01/2009)    Alcoholic cirrhosis of liver (05/01/2009)    Hypomagnesemia (05/01/2009)    Hypoxic respiratory failure--intubated/sedated/ on vent    NSTEMI--trop rising to 9, CPK MB 23, no ST elevation on EKG reviewed    Probable pneumonia   Plan:     Echo (bedside)--done and will review  ASA supp  Increase Lovenox to 1mg /kg SQ q12h  IV metoprolol  Consider Plavix--?risks of bleeding, hepatic encephalopathy, etc  Continue vent support  Cath at some point after initially stablizes

## 2009-05-04 NOTE — Progress Notes (Signed)
0600 Report received from PCU Nurse.   [id number] Pt received in CCU room 2514, pt agitated, restless, unresponsive, unable to assess LOC, nonverbal;  in four point  Soft restraints x 4 ext, &  Vest restraint.,  Tachypneic, tachycardic,   On  NRB mask  @10  lpm oxygen, Jaundiced  Skin & sclerae bilat, febrile @ 102.2  Axillary,   IVF dextrose 5% @ 200 ml/hr,   Dr. Tawanna Cooler @ the bedside, no new orders @ this time . Pt   To be intubated per Dr. Skeet Latch.  0630  Anesthesia @ bedside. Diprivan 5ml  IVP  given  Per anesthesia,  7.5  ET tube, 23 cm  @ teeth,  Breath sounds auscult bilat, will cont to monitor  0646 Dr. Tawanna Cooler paged  Orders received: versed 2-4 mg q2 hr prn sedation/agitation, morphine 1-2 mg prn  For pain.   0700 report given to oncoming nurse.

## 2009-05-04 NOTE — Progress Notes (Signed)
Dr. Juanetta Gosling in to see patient, orders recvd.

## 2009-05-04 NOTE — Progress Notes (Signed)
Patient incontinent of moderate amount loose brown stool, excoriation noted around rectum, extra protective ointment applied.  Resting quietly, RR 28, will monitor.

## 2009-05-04 NOTE — Progress Notes (Signed)
Pharmacy Note    Vancomycin Dosing Day #1    NT is a 48YOF who has been moved to critical care from oncology.  Height 63"     TBW 73.8 kg      IBW 52.4 kg    AdjBW 61kg  WBC 14.6     Tmax 102.6   SCr 1.5          CrCl ~ 37.9    Patient was given a loading dose of vancomycin 1750 mg IV x 1 on 05/03/09 at 1800.  Patient will start vancomycin 1250 mg IV Q24h (expected trough 15.2), first dose due 05/04/09 at 1800.  Goal 15-20.    Patient is also on levofloxacin 750 mg IV x 3 days and will begin zosyn 2.25 g IV Q8h on 05/04/09.    Awaiting culture results.  Continue to monitor renal function, WBC, and temperature.    Pharmacy will continue to follow.

## 2009-05-04 NOTE — Progress Notes (Signed)
Patient incontinent of moderate to large thick brown liquid BM after lactulose given per rectum.  Peri care given.

## 2009-05-04 NOTE — Progress Notes (Signed)
Versed 2mg  IV for RR 30's, agitation.  Will monitor.

## 2009-05-04 NOTE — Progress Notes (Signed)
Patient extremely restless, titrating propofol to make patient more comfortable.  Temp elevated at 102.6 ax, too early for tylenol, blood cultures to be drawn.  Hospitalist on floor at present.  Consult called to pulmonary and ABG drawn.  Mouth care completed. Skin jaundice, left arm wrapped with kling over splint from injury prior to hospitalization.  Bilateral wrist restraints in place for patient safety.  BP, HR stable at present.  Will continue to monitor.

## 2009-05-04 NOTE — Progress Notes (Signed)
All IV tubing changed for use of PICC line.

## 2009-05-04 NOTE — Consults (Signed)
Consult dictated; Pt with AMS and hepatic encephalopathy and pneumonia  Abn movements better on sedation  Impression; encephalopathy  Plan; prn ativan and EEG, and continue excellent medical support

## 2009-05-04 NOTE — Progress Notes (Signed)
Echocardiogram completed.  T, Dew, RDCS

## 2009-05-04 NOTE — Progress Notes (Signed)
2000 report received, febrile, vented, NAD, sedated, propofol gtt noted , OGT  to LWS,  finger , hard cast/splint on left arm , cap refill left arm < 3 seconds.  jaudiced skin & sclera bilat, opaque  pupils bilat; left pupil reactive, right pupil non-reactive & abnormally shaped , bilat soft  wrist restraints,  Excoriated area around/near rectum Will cont to monitor.   0000  continous rectal probe inserted, cooling blanket placed on standby,   0627  Temp @  100.4 rectally, Cooling blanket turned  On    0700 Dr. Glee Arvin @ the bedside. orders received. Report given to oncoming nurse.

## 2009-05-04 NOTE — Progress Notes (Addendum)
Hospitalist Progress Note         NAME: Breanna Lester        DOB:  Sep 28, 1960        MRN:  [id number]      Assessment & Plan   Sepsis syndrome :likely underlying pneumonia, fever, leukocytosis  ????????????????????????Broad spectrum antibiotics.  ????????????????????????No evidence of UTI or ascites(SBP), has an umbilical hernia that is not     reducible but overall adbomen is soft.    Acute respiratory failure:intubated with underlying Pneumonia in ICU    Altered mental status : multifactorial Sepsis, hypernatremia, ARF, hyperamonemia will get Neurology advice    Hypernatremia: c/w hypotonic saline    Pneumonia: c/w levaquin , zosyn and Vancomycin    Nausea with vomiting (05/01/2009) resolved, negative KUB and [place]    Hypokalemia (05/01/2009) improved, continue supplement KCL as needed  ????????????????????????Recheck labs    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,c/w PR lactulose while NPO    Gait abnormality (05/01/2009) B12, folate normal, PT/OT when MS improved    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       Subjective:     48yo caucasian female    Chief Complaint:  In ICU intubated with acute respiratory failure     Discussed with RN events overnight.     Review of Systems:  Fever/chills y   Cough N   Sputum    N   SOB/DOE  y   Chest Pain N   Abdominal Pain N   Diarrhea N   Constipation N   Nausea/Vomiting N   Dysuria N   Tolerating PT N   Tolerating Diet N   Could not obtain due to AMS vs intubation xx        Objective:   Patient intubated this AM with acute respiratory failure, febrile, tachycardic, leakage of troponin noted.    VITALS:   Last 24hrs VS reviewed since prior progress note. Most recent are:  Visit Vitals   Item Reading   ??? BP 109/58   ??? Pulse 123   ??? Temp 102.6 ??F (39.2 ??C)   ??? Resp 20   ??? Ht 5\' 2"  (1.575 m)   ??? Wt 162 lb 11.2 oz (73.8 kg)   ??? SpO2 100%   ??? PF 60L/min          Intake/Output Summary (Last 24 hours) at 05/04/09 0804  Last data filed at 05/04/09 0800   Gross per 24 hour   Intake 1259.16 ml   Output   2100 ml   Net -840.84 ml        Telemetry Reviewed:     PHYSICAL EXAM:  General: Intubated, sedated????  HEENT: Pupils unequal, R 5mm, L 2mm  Lungs:  Bilateral rales, ronchi and mild wheezing.  Heart:  Regular rhythm, tachycardic  Abdomen: Soft, Non distended, hypoactive BS, no rebound, umbilical hernia non reducible  Extremities: No edema, pulses present  Neurologic:?? GCS [id number], moving extremities, but extensor posture to pain   Psych:???? sedated    Lab Data Reviewed: (see below)    Medications Reviewed: (see below)    PMH/SH reviewed - no change compared to H&P  ______________________________________________________________________  Total time spent with patient: 45 minutes     Critical Care Provided     Minutes non procedure based    Care Plan discussed with:  Patient  Family    RN x   Care Manager    Consultant/Specialist      >50% of visit spent in counseling and coordination of care       Prophylaxis:  GI PPI   DVT SCD     Disposition:   Home with Family    HH/PT/OT/RN    SNF/LTC    [facility]      ______________________________________________________________________  Attending Physician: Hazeline Junker, MD   _____________________________________________________________________________________________________  Procedures: see electronic medical records for all procedures/Xrays and details  which were not copied into this note but were reviewed prior to creation of Plan.      LABS:  Recent Labs   Basename 05/04/09 0325 05/03/09 0255   ??? WBC 14.6* 12.4*   ??? HGB 9.7* 9.2*   ??? HCT 32.2* 30.3*   ??? PLT 118* 123*       Recent Labs   Basename 05/04/09 0325 05/03/09 1533 05/03/09 0255 05/02/09 0345   ??? NA 148* 162* 157* --   ??? K 3.5 4.7 4.6 --   ??? CL 116* 128* 120* --   ??? CO2 20* 22 22 --   ??? BUN 26* 24* 21* --   ??? CREA 1.5* 1.3 1.2 --   ??? GLU 483* 108* 91 --   ??? CA 8.3* 9.7 10.0 --    ??? MG 2.2 -- 2.2 1.9   ??? PHOS -- -- -- --   ??? URICA -- -- -- --       Recent Labs   Basename 05/03/09 0255   ??? SGOT 159*   ??? GPT 61   ??? AP 197*   ??? TBIL 4.8*   ??? TP 6.8   ??? ALB 3.3*   ??? GLOB 3.5   ??? GGT --   ??? AML --   ??? LPSE 59*         No results found for this basename: INR:3,PTP:3,APTT:3, in the last 72 hours     No results found for this basename: FE:2,TIBC:2,PSAT:2,FERR:2, in the last 72 hours   Recent Labs   Basename 05/04/09 0603 05/03/09 1425   ??? PH 7.40 7.37   ??? PCO2 39 39   ??? PO2 65* 53*       Recent Labs   Basename 05/04/09 0325 05/03/09 1533   ??? CPK 4683* 3788*   ??? CKMB -- --       Lab Results   Component Value Date/Time    POC GLUCOSE 114 05/01/2009  4:40 PM    POC GLUCOSE 162 05/01/2009 12:02 PM    POC GLUCOSE 115 05/01/2009  8:39 AM       Lab Results   Component Value Date/Time    Color YELLOW 04/30/2009 12:14 AM    Appearance CLEAR 04/30/2009 12:14 AM    Specific gravity 1.010 10/29/2008 11:00 AM    Specific gravity 1.011 04/30/2009 12:14 AM    pH 6.0 04/30/2009 12:14 AM    Protein NEGATIVE  04/30/2009 12:14 AM    Glucose NEGATIVE  04/30/2009 12:14 AM    Ketone NEGATIVE  04/30/2009 12:14 AM    Bilirubin NEGATIVE  04/30/2009 12:14 AM    Urobilinogen 2.0 04/30/2009 12:14 AM    Nitrites NEGATIVE  04/30/2009 12:14 AM    Leukocyte Esterase TRACE 04/30/2009 12:14 AM    Epithelial cells 10-20 04/30/2009 12:14 AM    Bacteria NEGATIVE  04/30/2009 12:14 AM    WBC 0-4 04/30/2009 12:14 AM    RBC  0-3 04/30/2009 12:14 AM         MEDICATIONS:  Current facility-administered medications   Medication Dose Route Frequency   ??? furosemide (LASIX) injection 40 mg  40 mg IntraVENous ONCE   ??? 0.45% sodium chloride infusion    IntraVENous CONTINUOUS   ??? aspirin (ASA) suppository 300 mg  300 mg Rectal DAILY   ??? propofol (DIPRIVAN) 10 mg/mL injection        ??? propofol (DIPRIVAN) infusion  5-50 mcg/kg/min IntraVENous TITRATE   ??? midazolam (VERSED) injection 2 mg  2 mg IntraVENous Q2H PRN   ??? morphine injection 1 mg  1 mg IntraVENous PRN    ??? midazolam (VERSED) 1 mg/mL injection        ??? pantoprazole (PROTONIX) injection 40 mg  40 mg IntraVENous DAILY   ??? DISCONTD: pantoprazole (PROTONIX) injection 40 mg  40 mg IntraVENous DAILY   ??? sodium chloride 0.9 % injection        ??? piperacillin-tazobactam (ZOSYN) 3.375 g in 0.9% sodium chloride (MBP/ADV) 100 mL MBP  3.375 g IntraVENous Q6H   ??? lactulose (CHRONULAC) solution 200 g  300 mL Rectal BID   ??? sodium chloride 0.9 % injection        ??? acetaminophen (TYLENOL) suppository 650 mg  650 mg Rectal Q6H PRN   ??? sodium chloride 0.9 % injection        ??? lorazepam (ATIVAN) injection 1 mg  1 mg IntraVENous ONCE   ??? vancomycin (VANCOCIN) 1.75 g in 0.9% sodium chloride 500 mL IVPB  1,750 mg IntraVENous ONCE   ??? albuterol (PROVENTIL VENTOLIN) nebulizer solution 2.5 mg  2.5 mg Nebulization QID   ??? ipratropium (ATROVENT) 0.02 % nebulizer solution 0.5 mg  0.5 mg Nebulization QID   ??? albuterol (PROVENTIL VENTOLIN) nebulizer solution 2.5 mg  2.5 mg Nebulization Q2H PRN   ??? thiamine (B-1) 100 mg in 0.9% sodium chloride 50 mL IVPB  100 mg IntraVENous DAILY   ??? furosemide (LASIX) injection 40 mg  40 mg IntraVENous ONCE   ??? nitroglycerin (NITROBID) 2 % ointment 2 Inch  2 Inch Topical Q6H PRN   ??? ketorolac (TORADOL) injection 15 mg  15 mg IntraVENous NOW   ??? DISCONTD: 0.45% sodium chloride infusion    IntraVENous CONTINUOUS   ??? DISCONTD: dextrose 5% infusion    IntraVENous CONTINUOUS   ??? DISCONTD: ketorolac (TORADOL) injection 15 mg  15 mg IntraVENous Q6H PRN   ??? DISCONTD: vancomycin (VANCOCIN) in 0.9% sodium chloride 250 mL infusion    IntraVENous    ??? DISCONTD: vancomycin (VANCOCIN) 1.75 g in 0.9% sodium chloride 500 mL IVPB  1,750 mg IntraVENous ONCE   ??? DISCONTD: vancomycin (VANCOCIN) 1.75 g in 0.9% sodium chloride 500 mL IVPB  1,750 mg IntraVENous ONCE   ??? DISCONTD: dextrose 5% infusion    IntraVENous CONTINUOUS   ??? DISCONTD: thiamine (B-1) injection 100 mg  100 mg IntraVENous DAILY    ??? levofloxacin (LEVAQUIN) 750 mg infusion   750 mg IntraVENous Q24H   ??? DISCONTD: lorazepam (ATIVAN) injection 1 mg  1 mg IntraVENous Q6H PRN   ??? DISCONTD: metronidazole (FLAGYL) IVPB 500 mg  500 mg IntraVENous Q8H   ??? DISCONTD: haloperidol lactate (HALDOL) injection 1 mg  1 mg IntraVENous Q1H PRN   ??? DISCONTD: acetaminophen (TYLENOL) suppository 650 mg  650 mg Rectal Q6H PRN   ??? DISCONTD: HYDROmorphone (PF) (DILAUDID) injection 0.5 mg  0.5 mg IntraVENous Q4H PRN   ??? enoxaparin (LOVENOX) injection 40  mg  40 mg SubCUTAneous Q24H   ??? DISCONTD: lactulose (CHRONULAC) solution 20 g  20 g Oral DAILY   ??? SALINE PERIPHERAL FLUSH Q8H 5 mL  5 mL InterCATHeter Q8H   ??? saline peripheral flush 5 mL  5 mL InterCATHeter PRN   ??? DISCONTD: sertraline (ZOLOFT) tablet 100 mg  100 mg Oral DAILY

## 2009-05-04 NOTE — Progress Notes (Signed)
Tylenol suppository for temp 102 axillary.

## 2009-05-04 NOTE — Progress Notes (Signed)
TRANSFER - OUT REPORT:    Verbal report given to D. Poleon RN  on Breanna Lester  being transferred to CCU for urgent transfer       Report consisted of patient???s Situation, Background, Assessment and   Recommendations(SBAR).     Information from the following report(s) SBAR, Kardex, Intake/Output, MAR and Recent Results was reviewed with the receiving nurse.    Opportunity for questions and clarification was provided.

## 2009-05-04 NOTE — Progress Notes (Signed)
Updated Dr Tawanna Cooler on pt condition. Crackles throughout with exp wheezing on 50% oxygen facetent.  Resp in to give treatment and O2 increased to 70% via facetent. Order rec to give 40mg  iv lasix now.

## 2009-05-04 NOTE — Consults (Signed)
Name: Breanna Lester, Breanna Lester Admitted: 04/29/2009  MR #: [id number] DOB: 12/06/1960  Account #: [id number] Age: 48  Consultant: Youlanda Roys, MD Location: [phone]     CONSULTATION REPORT    DATE OF CONSULTATION: 05/04/2009      CHIEF COMPLAINT: Altered mental status.    REASON FOR CONSULTATION: We are requested to evaluate patient for altered  mental status.    HISTORY OF PRESENT ILLNESS: The patient is a 48 year old right-handed  female who is admitted to the hospital on 04/30/2009 with nausea, vomiting,  hypokalemia, hypomagnesemia, left distal ulna and radial fracture, hepatic  encephalopathy. The patient became acutely ill last night, had to be  intubated. She had a lot of involuntary abnormal movements apparently about  the head at that time. We were asked to evaluate. The patient was admitted  on 09/10, had no complaints of headache, focal or neurological symptoms  other than her vision was worsening gradually. She had a fall from that  apparently too. The fall is a little unclear. She is now quieter on  Diprivan 50 mcg and versed 2 mg. She has had no clear seizure activity  noted.    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 She has bilateral pneumonia apparently, respiratory insufficiency  is now being intubated and is on a ventilator. She had a CT scan of the  head that was normal. She has had a history of depression. She has  temperature of 102 degrees now. She has never had any clear meningismus or  complaints of headache. She is intubated and sedated.    Her past medical history is also pertinent in that she has had  tonsillectomy, adenoidectomy, C-section, hysterectomy and D and C.    SOCIAL HISTORY: She is an ex-smoker. She is an ex-drinker. She is married  apparently.    CURRENT MEDICATIONS  1. DuoNeb nebulizers.  2. Aspirin suppository.  3. lactulose.  4. Lovenox.  5. Lopressor.  6. Protonix.  7. Zosyn.  8. Multivitamin and thiamine every day.   9. She is on Diprivan as above.    FAMILY HISTORY: Unobtainable.    REVIEW OF SYSTEMS: Unobtainable.    NEUROLOGIC EXAMINATION: Shows her lying in bed. Pupils are irregular  slightly and react sluggishly. She is sedated however. Her extraocular  motility appears to be intact. Corneal's intact. She seems to have  bilateral Babinski signs present. Deep tendon reflexes are diffusely  hypoactive. She does not respond verbally to commands. She has supple neck.  Her fundi are poorly seen secondary to dense cataracts. Patient has  hypoactive and symmetric reflexes throughout. Her neck was supple. She has  hypotonic muscle tone. Reflexes are hypoactive but symmetric. She had  nonpurposeful withdrawal to deep pain.    PHYSICAL EXAMINATION  HEAD, EARS, EYES, NOSE AND THROAT: Shows significantly irregular pupils,  right greater than left.  NECK: Supple.  CHEST: Decreased breath sounds bilateral in the bases.  EXTREMITIES: Shows no neurocutaneous lesions seen. Pulses are intact.  CARDIAC: Regular rhythm.  ABDOMEN: Benign. There is a slight ________.    IMPRESSION: The patient with acute encephalopathy, most likely moderate to  severe in nature. The patient had no headache, no meningismus on admission,  so I do not think this is a meningitis. We will continue to evaluate for  cardiac abnormalities due to elevated CPKs. We will get carotid Doppler,  get an EEG. Continue ________ she is already on blood thinners and will get  an EEG and a  CT scan. I will give her Ativan p.r.n. for her spells. The  patient is not having any clinical seizures. Will follow closely with you.  Difficult case. May need to repeat CT scan in the future. Recheck her blood  work in the morning.        Reviewed on 05/05/2009 8:21 AM            Youlanda Roys, MD    cc: Cyndie Chime, M.D.   Youlanda Roys, MD        TAS/wmx; D: 05/04/2009 1:39 P; T: 05/04/2009 6:20 P; DOC# [id number]; Job#  [id number]

## 2009-05-04 NOTE — Progress Notes (Signed)
Patient still with RR 30's on vent with propofol.  Dr. Jayme Cloud in to see patient, versed 2mg  IV given.

## 2009-05-04 NOTE — Progress Notes (Signed)
Awaiting x-ray for confirmation of PICC line placement to give antibiotics.  Not compatible with propofol.  Patient must stay sedated.

## 2009-05-04 NOTE — Progress Notes (Signed)
Dr. Smith in to see patient.

## 2009-05-04 NOTE — Consults (Signed)
Name: Breanna Lester, Breanna Lester Admitted: 04/29/2009  MR #: [id number] DOB: [birth year]  Account #: [id number] Age: 48  Consultant: Vickki Muff, M.D. Location: [phone]     CONSULTATION REPORT    DATE OF CONSULTATION: 05/04/2009      REFERRING PHYSICIAN: Dr. Skeet Latch    REASON FOR CONSULTATION: Acute respiratory failure. Pneumonia.    HISTORY OF PRESENT ILLNESS: This patient is a 48 year old, white female  with multiple medical problems, who was admitted on 04/29/2009 with  pneumonia. The patient's respiratory status changed and worsened,  transferred to the ICU where she was intubated this morning. The patient  now is on broad spectrum antibiotics, full vent support, with bilateral  airspace disease on chest x-ray.    PAST MEDICAL HISTORY  1. Cirrhosis.  2. Depression.  3. Anxiety.    SOCIAL HISTORY: Former smoker, not a drinker.    FAMILY HISTORY: Noncontributory from a pulmonary standpoint.    ALLERGIES: LATEX.    REVIEW OF SYSTEMS: Unable to obtain because the patient is sedated and  intubated.    PHYSICAL EXAMINATION  VITAL SIGNS: Temperature 103, pulse 127, respiratory rate 25, blood  pressure 135/63, pulse oximetry 100%.  HEENT: Pupils are reactive. Sclerae is anicteric. Buccal mucosa is moist.  No thrush.  NECK: Supple. No JVD or lymphadenopathy. Bilateral carotid pulses are  present.  CARDIOVASCULAR: Regular rate and rhythm, tachycardic.  LUNGS: Diffuse rales and rhonchi bilaterally.  ABDOMEN: Soft, nontender, nondistended. No organomegaly.  EXTREMITIES: No edema, clubbing, or cyanosis.  NEUROLOGIC: Patient is sedated.  SKIN: Reveals no rashes.    IMAGING: Chest x-ray reveals bilateral air space disease. ________  represent probably due to positive pressure ventilation.    LABORATORY DATA: ABG: pH 7.47, pCO2 31, PO2 507, 100% percent saturation.  Sodium 148, potassium 3.5, chloride 116, CO2 20, BUN 26, creatinine 1.5,  glucose 43, WBC 14.6, hemoglobin 10 and hematocrit 32, platelets 118.    ASSESSMENT/PLAN   1. Acute respiratory failure. We will adjust vent settings. The patient  will remain on the ventilator for now.  2. Bilateral pneumonia. The patient is on broad-spectrum antibiotics.  3. Jet nebulizer treatments.  4. Volume resuscitation.  5. Peptic ulcer disease and DVT prophylaxis.  6. Patient is fully sedated.  7. The patient is critically ill.            Vickki Muff, M.D.    cc: Vickki Muff, M.D.        ERG/wmx; D: 05/04/2009 9:20 A; T: 05/04/2009 9:47 A; DOC# [id number]; Job#  [id number]

## 2009-05-05 LAB — BLOOD GAS ARTERIAL,VENT
BASE DEFICIT: 1.3 mmol/L — AB
BICARBONATE: 21 mmol/L — ABNORMAL LOW (ref 22–26)
FIO2: 50 %
O2 SAT: 95 % (ref 92–97)
PCO2: 31 mmHg — ABNORMAL LOW (ref 35–45)
PEEP/CPAP: 5
PO2: 72 mmHg — ABNORMAL LOW (ref 80–100)
SET RATE: 10
VT/PIP: 500
pH: 7.46 — ABNORMAL HIGH (ref 7.35–7.45)

## 2009-05-05 LAB — CULTURE, MRSA

## 2009-05-05 LAB — CBC WITH AUTOMATED DIFF
ABS. BASOPHILS: 0 10*3/uL (ref 0.0–0.1)
ABS. EOSINOPHILS: 0.1 10*3/uL (ref 0.0–0.4)
ABS. LYMPHOCYTES: 1.5 10*3/uL (ref 0.8–3.5)
ABS. MONOCYTES: 0.8 10*3/uL (ref 0.0–1.0)
ABS. NEUTROPHILS: 9.7 10*3/uL — ABNORMAL HIGH (ref 1.8–8.0)
BASOPHILS: 0 % (ref 0–1)
EOSINOPHILS: 0 % (ref 0–7)
HCT: 32.5 % — ABNORMAL LOW (ref 35.0–47.0)
HGB: 10 g/dL — ABNORMAL LOW (ref 11.5–16.0)
LYMPHOCYTES: 12 % (ref 12–49)
MCH: 26.7 PG (ref 26.0–34.0)
MCHC: 30.8 g/dL (ref 30.0–36.5)
MCV: 86.9 FL (ref 80.0–99.0)
MONOCYTES: 6 % (ref 5–13)
NEUTROPHILS: 82 % — ABNORMAL HIGH (ref 32–75)
PLATELET: 94 10*3/uL — ABNORMAL LOW (ref 150–400)
RBC: 3.74 M/uL — ABNORMAL LOW (ref 3.80–5.20)
RDW: 19.4 % — ABNORMAL HIGH (ref 11.5–14.5)
WBC: 12 10*3/uL — ABNORMAL HIGH (ref 3.6–11.0)

## 2009-05-05 LAB — COAGULATION SCREEN
Fibrinogen: 244 mg/dL (ref 200–475)
INR: 3 — ABNORMAL HIGH (ref 0.9–1.1)
Prothrombin time: 28.9 SECS — ABNORMAL HIGH (ref 9.0–11.0)
aPTT: 32.6 s (ref 24.0–33.0)

## 2009-05-05 LAB — EKG, 12 LEAD, INITIAL
Atrial Rate: 125 {beats}/min
Atrial Rate: 89 {beats}/min
Calculated P Axis: 42 degrees
Calculated P Axis: 52 degrees
Calculated R Axis: 32 degrees
Calculated R Axis: 57 degrees
Calculated T Axis: 120 degrees
Calculated T Axis: 173 degrees
Diagnosis: NORMAL
P-R Interval: 122 ms
P-R Interval: 166 ms
Q-T Interval: 276 ms
Q-T Interval: 440 ms
QRS Duration: 74 ms
QRS Duration: 84 ms
QTC Calculation (Bezet): 398 ms
QTC Calculation (Bezet): 535 ms
Ventricular Rate: 125 {beats}/min
Ventricular Rate: 89 {beats}/min

## 2009-05-05 LAB — TSH 3RD GENERATION: TSH: <0.01 u[IU]/mL — ABNORMAL LOW (ref 0.36–3.74)

## 2009-05-05 LAB — METABOLIC PANEL, COMPREHENSIVE
A-G Ratio: 0.7 — ABNORMAL LOW (ref 1.1–2.2)
ALT (SGPT): 108 U/L — ABNORMAL HIGH (ref 12–78)
AST (SGOT): 170 U/L — ABNORMAL HIGH (ref 15–37)
Albumin: 2.4 g/dL — ABNORMAL LOW (ref 3.5–5.0)
Alk. phosphatase: 144 U/L — ABNORMAL HIGH (ref 50–136)
Anion gap: 10 mmol/L (ref 5–15)
BUN/Creatinine ratio: 24 — ABNORMAL HIGH (ref 12–20)
BUN: 44 MG/DL — ABNORMAL HIGH (ref 6–20)
Bilirubin, total: 4.5 MG/DL — ABNORMAL HIGH (ref 0.2–1.0)
CO2: 24 MMOL/L (ref 21–32)
Calcium: 8.2 MG/DL — ABNORMAL LOW (ref 8.5–10.1)
Chloride: 125 MMOL/L — ABNORMAL HIGH (ref 97–108)
Creatinine: 1.8 MG/DL — ABNORMAL HIGH (ref 0.6–1.3)
GFR est AA: 39 mL/min/{1.73_m2} — ABNORMAL LOW (ref >60)
GFR est non-AA: 32 mL/min/{1.73_m2} — ABNORMAL LOW (ref >60)
Globulin: 3.6 g/dL (ref 2.0–4.0)
Glucose: 165 MG/DL — ABNORMAL HIGH (ref 65–100)
Potassium: 3.1 MMOL/L — ABNORMAL LOW (ref 3.5–5.1)
Protein, total: 6 g/dL — ABNORMAL LOW (ref 6.4–8.2)
Sodium: 159 MMOL/L — ABNORMAL HIGH (ref 136–145)

## 2009-05-05 LAB — PHOSPHORUS: Phosphorus: 3 MG/DL (ref 2.5–4.9)

## 2009-05-05 LAB — SED RATE (ESR): Sed rate (ESR): 15 MM/HR (ref 0–20)

## 2009-05-05 LAB — VITAMIN B12 & FOLATE
Folate: 9.4 ng/mL (ref 5.0–21.0)
Vitamin B12: 1745 pg/mL — ABNORMAL HIGH (ref 254–1320)

## 2009-05-05 LAB — MAGNESIUM: Magnesium: 2.6 MG/DL — ABNORMAL HIGH (ref 1.6–2.4)

## 2009-05-05 LAB — T4 (THYROXINE): T4, Total: 11.8 ug/dL (ref 4.8–13.9)

## 2009-05-05 LAB — T3, FREE: Free Triiodothyronine (T3): 3.3 pg/mL (ref 2.2–4.0)

## 2009-05-05 LAB — TROPONIN I: Troponin-I, Qt.: 10.16 ng/mL — ABNORMAL HIGH (ref <0.05)

## 2009-05-05 LAB — AMMONIA: Ammonia, plasma: 45 umol/L — ABNORMAL HIGH (ref <32)

## 2009-05-05 LAB — CK: CK: 1388 U/L — ABNORMAL HIGH (ref 21–215)

## 2009-05-05 MED ADMIN — metoprolol tartrate (LOPRESSOR) injection 5 mg: INTRAVENOUS | @ 10:00:00 | NDC 00143987310

## 2009-05-05 MED ADMIN — enoxaparin (LOVENOX) injection 60 mg: SUBCUTANEOUS | @ 03:00:00 | NDC 00075062160

## 2009-05-05 MED ADMIN — midazolam (VERSED) injection 2-4 mg: INTRAVENOUS | @ 22:00:00 | NDC 10019002837

## 2009-05-05 MED ADMIN — propofol (DIPRIVAN) infusion: INTRAVENOUS | @ 03:00:00 | NDC 63323026965

## 2009-05-05 MED ADMIN — piperacillin-tazobactam (ZOSYN) 2.25 g in 0.9% sodium chloride (MBP/ADV) 50 mL MBP: INTRAVENOUS | @ 18:00:00 | NDC 00338055311

## 2009-05-05 MED ADMIN — lactulose (CHRONULAC) solution 20 g: ORAL | @ 15:00:00 | NDC 00121457730

## 2009-05-05 MED ADMIN — albuterol/ipratropium (DUONEB) neb solution: RESPIRATORY_TRACT | @ 15:00:00 | NDC 00487980101

## 2009-05-05 MED ADMIN — vancomycin (VANCOCIN) 1.25 g in 0.9% sodium chloride 250 mL IVPB: INTRAVENOUS | @ 01:00:00 | NDC 63323031461

## 2009-05-05 MED ADMIN — metoprolol tartrate (LOPRESSOR) injection 2.5 mg: INTRAVENOUS | @ 22:00:00 | NDC 00143987310

## 2009-05-05 MED ADMIN — midazolam (VERSED) injection 2-4 mg: INTRAVENOUS | @ 18:00:00 | NDC 10019002837

## 2009-05-05 MED ADMIN — albuterol/ipratropium (DUONEB) neb solution: RESPIRATORY_TRACT | @ 20:00:00 | NDC 00487980101

## 2009-05-05 MED ADMIN — 0.45% sodium chloride infusion: INTRAVENOUS | @ 06:00:00 | NDC 00409798509

## 2009-05-05 MED ADMIN — sodium chloride 0.9 % bolus infusion 500 mL: INTRAVENOUS | @ 12:00:00 | NDC 00409798303

## 2009-05-05 MED ADMIN — sodium chloride (NS) flush 10 mL: @ 18:00:00 | NDC 87701099893

## 2009-05-05 MED ADMIN — dextrose 5% - 0.45% NaCl with KCl 20 mEq/L infusion: INTRAVENOUS | @ 13:00:00 | NDC 00409790209

## 2009-05-05 MED ADMIN — potassium chloride 20 mEq in 50 ml IVPB: INTRAVENOUS | @ 17:00:00 | NDC 00338070341

## 2009-05-05 MED ADMIN — propofol (DIPRIVAN) infusion: INTRAVENOUS | @ 07:00:00 | NDC 00703285809

## 2009-05-05 MED ADMIN — propofol (DIPRIVAN) infusion: INTRAVENOUS | @ 17:00:00 | NDC 63323026965

## 2009-05-05 MED ADMIN — piperacillin-tazobactam (ZOSYN) 2.25 g in 0.9% sodium chloride (MBP/ADV) 50 mL MBP: INTRAVENOUS | @ 10:00:00 | NDC 00338055311

## 2009-05-05 MED ADMIN — dextrose 5% with KCl 20 mEq/L infusion: INTRAVENOUS | @ 16:00:00 | NDC 00409790509

## 2009-05-05 MED ADMIN — aspirin (ASPIRIN) tablet 325 mg: ORAL | @ 15:00:00 | NDC 00904200960

## 2009-05-05 MED ADMIN — potassium chloride 20 mEq in 50 ml IVPB: INTRAVENOUS | @ 16:00:00 | NDC 00409707714

## 2009-05-05 MED ADMIN — vancomycin (VANCOCIN) 1 g in 0.9% sodium chloride 250 mL IVPB: INTRAVENOUS | @ 22:00:00 | NDC 63323031461

## 2009-05-05 MED ADMIN — albuterol/ipratropium (DUONEB) neb solution: RESPIRATORY_TRACT | @ 13:00:00 | NDC 00487980101

## 2009-05-05 MED ADMIN — albumin human 5% (BUMINATE) 5 % solution: @ 15:00:00 | NDC 44206031025

## 2009-05-05 MED ADMIN — propofol (DIPRIVAN) infusion: INTRAVENOUS | @ 07:00:00 | NDC 54569434000

## 2009-05-05 MED ADMIN — sodium chloride (NS) flush 10 mL: @ 10:00:00 | NDC 87701099893

## 2009-05-05 MED ADMIN — piperacillin-tazobactam (ZOSYN) 2.25 g in 0.9% sodium chloride (MBP/ADV) 50 mL MBP: INTRAVENOUS | @ 03:00:00 | NDC 00338055311

## 2009-05-05 MED ADMIN — levofloxacin (LEVAQUIN) 750 mg infusion: INTRAVENOUS | @ 19:00:00 | NDC 00045006601

## 2009-05-05 MED ADMIN — thiamine (B-1) 100 mg in 0.9% sodium chloride 50 mL IVPB: INTRAVENOUS | @ 13:00:00 | NDC 63323001302

## 2009-05-05 MED ADMIN — sodium chloride (NS) flush 10 mL: @ 02:00:00 | NDC 87701099893

## 2009-05-05 MED ADMIN — enoxaparin (LOVENOX) injection 60 mg: SUBCUTANEOUS | @ 17:00:00 | NDC 00075062160

## 2009-05-05 MED ADMIN — propofol (DIPRIVAN) infusion: INTRAVENOUS | @ 12:00:00 | NDC 63323026965

## 2009-05-05 MED ADMIN — propofol (DIPRIVAN) infusion: INTRAVENOUS | @ 01:00:00 | NDC 63323027050

## 2009-05-05 MED ADMIN — pantoprazole (PROTONIX) injection 40 mg: INTRAVENOUS | @ 15:00:00 | NDC 00409488810

## 2009-05-05 MED ADMIN — albuterol/ipratropium (DUONEB) neb solution: RESPIRATORY_TRACT | @ 01:00:00 | NDC 00487980101

## 2009-05-05 MED ADMIN — propofol (DIPRIVAN) infusion: INTRAVENOUS | @ 08:00:00 | NDC 63323026965

## 2009-05-05 MED ADMIN — lactulose (CHRONULAC) solution 200 g: RECTAL | @ 03:00:00 | NDC 00121457730

## 2009-05-05 MED FILL — LACTULOSE 10 GRAM/15 ML ORAL SOLN: 10 gram/15 mL | ORAL | Qty: 300

## 2009-05-05 MED FILL — METOPROLOL TARTRATE 5 MG/5 ML IV SOLN: 5 mg/ mL | INTRAVENOUS | Qty: 5

## 2009-05-05 MED FILL — PROPOFOL 10 MG/ML IV EMUL: 10 mg/mL | INTRAVENOUS | Qty: 200

## 2009-05-05 MED FILL — POTASSIUM CHLORIDE 20 MEQ/50 ML IV PIGGY BACK: 20 mEq/50 mL | INTRAVENOUS | Qty: 50

## 2009-05-05 MED FILL — ZOSYN 2.25 GRAM INTRAVENOUS SOLUTION: 2.25 gram | INTRAVENOUS | Qty: 2.25

## 2009-05-05 MED FILL — LACTULOSE 10 GRAM/15 ML ORAL SOLN: 10 gram/15 mL | ORAL | Qty: 30

## 2009-05-05 MED FILL — ASPIRIN 325 MG TAB: 325 mg | ORAL | Qty: 1

## 2009-05-05 MED FILL — MIDAZOLAM 1 MG/ML IJ SOLN: 1 mg/mL | INTRAMUSCULAR | Qty: 5

## 2009-05-05 MED FILL — SODIUM CHLORIDE 0.9 % INJECTION: INTRAMUSCULAR | Qty: 10

## 2009-05-05 MED FILL — VANCOMYCIN 10 GRAM IV SOLR: 10 gram | INTRAVENOUS | Qty: 1

## 2009-05-05 MED FILL — LOVENOX 60 MG/0.6 ML SUBCUTANEOUS SYRINGE: 60 mg/0.6 mL | SUBCUTANEOUS | Qty: 1

## 2009-05-05 MED FILL — SODIUM CHLORIDE 0.9 % IV: INTRAVENOUS | Qty: 500

## 2009-05-05 MED FILL — THIAMINE 100 MG/ML INJECTION: 100 mg/mL | INTRAMUSCULAR | Qty: 1

## 2009-05-05 MED FILL — LEVAQUIN 750 MG/150 ML IN 5 % DEXTROSE INTRAVENOUS PIGGYBACK: 750 mg/150 mL | INTRAVENOUS | Qty: 150

## 2009-05-05 MED FILL — VANCOMYCIN 10 GRAM IV SOLR: 10 gram | INTRAVENOUS | Qty: 1.25

## 2009-05-05 MED FILL — BUMINATE 5 % INTRAVENOUS SOLUTION: 5 % | INTRAVENOUS | Qty: 500

## 2009-05-05 MED FILL — D5-1/2 NS & POTASSIUM CHLORIDE 20 MEQ/L IV: 20 mEq/L | INTRAVENOUS | Qty: 1000

## 2009-05-05 MED FILL — SALINE FLUSH INJECTION SYRINGE: INTRAMUSCULAR | Qty: 60

## 2009-05-05 MED FILL — IPRATROPIUM BROMIDE 0.02 % SOLN FOR INHALATION: 0.02 % | RESPIRATORY_TRACT | Qty: 7.5

## 2009-05-05 MED FILL — SALINE FLUSH INJECTION SYRINGE: INTRAMUSCULAR | Qty: 30

## 2009-05-05 NOTE — Progress Notes (Signed)
 Formatting of this note is different from the original.  PULMONARY/Critical Care/SLEEP MEDICINE - [facility]  Name: Breanna Lester   DOB: 07/06/1961   MRN: [id number]   Date: 05/05/2009 10:32 AM   [x]     I have reviewed the flowsheet and previous day?s notes.  [x]    The patient is unable to give any meaningful history or review of systems because the patient is:  [x]    Intubated []       [x]    Sedated    []    Unresponsive      [x]    The patient is critically ill on      [x]    Mechanical ventilation []    Pressors   []    BiPAP []           MND:Mzcpzt of systems not obtained due to patient factors.    Vital Signs:    BP 91/41  Pulse 98  Temp 99.3 F (37.4 C)  Resp 21  Ht 5' 2 (1.575 m)  Wt 144 lb 13.5 oz (65.7 kg)  SpO2 99%  PF 65 L/min    O2 Device: Endotracheal tube   O2 Flow Rate (L/min): 2 l/min   Temp (24hrs), Avg:100.9 F (38.3 C), Min:99 F (37.2 C), Max:102.7 F (39.3 C)      Intake/Output:   Last shift:      In: 633.5 (633.5 I.V.)  Out: 31 (31 Urine)    Last 3 shifts: In: 4639 (4489 I.V.)  Out: 3633 (3133 Urine)      Intake/Output Summary (Last 24 hours) at 05/05/09 1032  Last data filed at 05/05/09 0800   Gross per 24 hour   Intake 3573.3 ml   Output   1479 ml   Net 2094.3 ml     Hemodynamics:   PAP:     Wedge:     CVP:      CO:     CI:     SVR:     PVR:        Ventilator Settings:  Ventilator  Mode: Assist control  Respiratory Rate  Resp Rate Set: 10   Resp Rate Observed: 22   Insp Flow (l/min): 65 l/sec  I:E Ratio: 1:1.5  Ventilator Volumes  Vt Set (ml): 500 ml  Vt Observed (ml): 612 ml  Ve Observed (l/min): 12.1 l/min  Ventilator Pressures  PIP Observed (cm H2O): 32 cm H2O  Plateau Pressure (cm H2O): 25 cm H2O  MAP (cm H2O): 13   PEEP/CPAP (cm H2O): 5 cm H20    Physical Exam:    General: [x]    Intubated/sedated []    No acute distress []        [x]    Ill appearing []     []          HEENT: []    Icteric [x]    PERRL []        [x]    Anicteric Mucosa:[x]    moist   []    dry []         Resp:  []    Wheeze []    Clear to ascultation bilaterally []    Accessory muscle use    []    Rales []    Chest tube:  []    Air leak []        [x]    Ronchi []    Crepitus []       [x]    Coarse bilateral ventilator breath sounds     CV: [x]    Regular []    Tachycardia []        []   Irregular []    Bradycardic []        []    Murmur []    S3 []        []    Edema []    S4 []         GI: [x]    Soft  [x]    NG Tube Bowel sounds: []    present [x]    absent    []    Firm []    PEG []        []    Distended  []         GU: [x]    Foley []    Hematuria []        [x]    Clear urine []    Edema []         MSK:    [x]    SCD?s []    Edema []        [x]    No deformities []     []         Skin: [x]    Warm [x]    Dry  []        []    Cool []    Moist []        []    Hot  []    Diaphoretic []        []    Rash  []    Cyanosis []         N-psych: [x]    Sedated  []    Agitated []        []    Alert  Follows commands:   []    Yes  [x]    No []         Devices: [x]    ET-Tube []    Tracheostomy []    Chest tube: Air leak? []    Y/[]    N    []    Central Line []    PA Catheter []        []    PICC []     []         DATA:   Current facility-administered medications   Medication Dose Route Frequency   ? propofol (DIPRIVAN) 10 mg/mL injection        ? dextrose 5% - 0.45% NaCl with KCl 20 mEq/L infusion    IntraVENous CONTINUOUS   ? sodium chloride  0.9 % bolus infusion 500 mL  500 mL IntraVENous ONCE   ? lactulose (CHRONULAC) solution 20 g  20 g Oral BID   ? aspirin (ASPIRIN) tablet 325 mg  325 mg Oral DAILY   ? propofol (DIPRIVAN) infusion  5-50 mcg/kg/min IntraVENous TITRATE   ? morphine  injection 1 mg  1 mg IntraVENous PRN   ? pantoprazole  (PROTONIX ) injection 40 mg  40 mg IntraVENous DAILY   ? levofloxacin (LEVAQUIN) 750 mg infusion   750 mg IntraVENous Q48H   ? piperacillin-tazobactam (ZOSYN) 2.25 g in 0.9% sodium chloride  (MBP/ADV) 50 mL MBP  2.25 g IntraVENous Q8H   ? albuterol /ipratropium (DUONEB ) neb solution   1 Dose Nebulization Q4HWA RT   ? vancomycin (VANCOCIN) 1.25 g in 0.9% sodium chloride   250 mL IVPB  1,250 mg IntraVENous Q24H   ? midazolam (VERSED) injection 2-4 mg  2-4 mg IntraVENous Q1H PRN   ? metoprolol  tartrate (LOPRESSOR ) injection 5 mg  5 mg IntraVENous Q6H   ? enoxaparin  (LOVENOX ) injection 60 mg  60 mg SubCUTAneous Q12H   ? lorazepam (ATIVAN) injection 1-2 mg  1-2 mg IntraVENous Q6H PRN   ? sodium chloride  (NS) flush 10 mL  10 mL InterCATHeter Q8H   ? sodium chloride  (NS) flush 10 mL  10  mL InterCATHeter PRN   ? heparin (porcine) pf 300 Units  300 Units InterCATHeter PRN   ? sodium chloride  (NS) flush 20 mL  20 mL InterCATHeter PRN   ? propofol (DIPRIVAN) 10 mg/mL injection        ? DISCONTD: aspirin (ASA) suppository 300 mg  300 mg Rectal DAILY   ? DISCONTD: midazolam (VERSED) injection 2 mg  2 mg IntraVENous Q2H PRN   ? DISCONTD: 0.45% sodium chloride  infusion    IntraVENous CONTINUOUS   ? DISCONTD: enoxaparin  (LOVENOX ) injection 40 mg  40 mg SubCUTAneous DAILY   ? acetaminophen  (TYLENOL ) suppository 650 mg  650 mg Rectal Q6H PRN   ? albuterol  (PROVENTIL  VENTOLIN ) nebulizer solution 2.5 mg  2.5 mg Nebulization Q2H PRN   ? thiamine (B-1) 100 mg in 0.9% sodium chloride  50 mL IVPB  100 mg IntraVENous DAILY   ? nitroglycerin (NITROBID) 2 % ointment 2 Inch  2 Inch Topical Q6H PRN   ? DISCONTD: lactulose (CHRONULAC) solution 200 g  300 mL Rectal BID   ? SALINE PERIPHERAL FLUSH Q8H 5 mL  5 mL InterCATHeter Q8H   ? saline peripheral flush 5 mL  5 mL InterCATHeter PRN     Telemetry: [x]    Sinus []    A-flutter []    Paced    []    A-fib []    Multiple PVC?s     Labs:  Recent Labs   Basename 05/05/09 0530 05/04/09 0325 05/03/09 0255   ? WBC 12.0* 14.6* 12.4*   ? HGB 10.0* 9.7* 9.2*   ? HCT 32.5* 32.2* 30.3*   ? PLT 94* 118* 123*     Recent Labs   Basename 05/05/09 0530 05/04/09 0325 05/03/09 1533 05/03/09 0255   ? NA 159* 148* 162* --   ? K 3.1* 3.5 4.7 --   ? CL 125* 116* 128* --   ? CO2 24 20* 22 --   ? GLU 165* 483* 108* --   ? BUN 44* 26* 24* --   ? CREA 1.8* 1.5* 1.3 --   ? CA 8.2* 8.3* 9.7 --   ? MG  2.6* 2.2 -- 2.2   ? PHOS 3.0 -- -- --   ? ALB 2.4* -- -- 3.3*   ? TBIL 4.5* -- -- 4.8*   ? SGOT 170* -- -- 159*   ? INR 3.0* -- -- --     Recent Labs   Basename 05/05/09 0409 05/04/09 0815 05/04/09 0603   ? PH 7.46* 7.47* 7.40   ? PCO2 31* 31* 39   ? PO2 72* 507* 65*   ? HCO3 21* 22 24   ? FIO2 50 100 70     Imaging:  [x]    I have personally reviewed the patient?s radiographs  []    Radiographs reviewed with radiologist   []    No change from prior, tubes and lines in adequate position  []    Improved   [x]    Worsening    IMPRESSION:     The patient is: [x]    acutely ill Risk of deterioration: []    moderate    []    critically ill  [x]    high     Patient Active Hospital Problem List:  Nausea with vomiting (05/01/2009)    Hypokalemia (05/01/2009)    Hepatic encephalopathy (05/01/2009)    Gait abnormality (05/01/2009)    Alcoholic cirrhosis of liver (05/01/2009)    Hypomagnesemia (05/01/2009)         PLAN:  1.  Adjust vent settings  2. Adjust ivf  3. Cont iv abx  4. Renal fx worse  5. PUD/DVT prophylaxis  6. Fully sedated  7. Replete K     [x]    See my orders for details    My assessment/plan was discussed with:  [x]    nursing []    PT/OT    []    respiratory therapy []    Dr.   Annice    family []         []    Total critical care time exclusive of procedures       minutes  Catalina FABIENE Sanders, MD      Electronically signed by Sanders Catalina SAUNDERS, MD at 05/05/2009 10:37 AM EDT

## 2009-05-05 NOTE — Progress Notes (Signed)
 Formatting of this note might be different from the original.  [facility] Vascular Lab Technologist Preliminary Report:  Carotid Duplex Scan    Right:  Mild plaque noted in the right carotid system.  Right ICA velocities suggest 16-49% diameter reduction.  Right vertebral artery flow is unable to be visualized.    Left:  Mild plaque noted in the left carotid system.  Left ICA velocities suggest 16-49% diameter reduction.  Left vertebral artery flow is unable to be visualized.    Final report to follow.    Electronically signed by Nelda Alm LABOR, RDMS at 05/05/2009  3:58 PM EDT

## 2009-05-05 NOTE — Progress Notes (Signed)
 Formatting of this note is different from the original.       Hospitalist Progress Note         NAME: Breanna Lester        DOB:  September 08, 1960        MRN:  [id number]      Assessment & Plan   Sepsis syndrome :likely underlying pneumonia, fever, leukocytosis  Broad spectrum antibiotics.  No evidence of UTI or ascites(SBP), has an umbilical hernia that is not     reducible but overall adbomen is soft.    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     Subjective:     48yo caucasian female    Chief Complaint:  In ICU intubated with acute respiratory failure     Discussed with RN events overnight.     Review of Systems:  Fever/chills y   Cough N   Sputum    N   SOB/DOE  N   Chest Pain N   Abdominal Pain N   Diarrhea N   Constipation N   Nausea/Vomiting N   Dysuria N   Tolerating PT N   Tolerating Diet N   Could not obtain due to AMS vs intubation xx       Objective:   Patient intubated this AM with acute respiratory failure, febrile, tachycardic, mild hypotension    VITALS:   Last 24hrs VS reviewed since prior progress note. Most recent are:  Visit Vitals   Item Reading   ? BP 90/39   ? Pulse 85   ? Temp 100.4 F (38 C)   ? Resp 27   ? Ht 5' 2  (1.575 m)   ? Wt 144 lb 13.5 oz (65.7 kg)   ? SpO2 98%   ? PF 65L/min     Intake/Output Summary (Last 24 hours) at 05/05/09 0737  Last data filed at 05/05/09 0700   Gross per 24 hour   Intake 3519.29 ml   Output   1533 ml   Net 1986.29 ml       Telemetry Reviewed:     PHYSICAL EXAM:  General: Intubated, sedated  HEENT: Pupils unequal, R 3mm, L 2mm  Lungs:  Bilateral rales, ronchi and mild wheezing.  Heart:  Regular rhythm, tachycardic, SEM  Abdomen: Soft, Non distended, hypoactive BS, no rebound, umbilical hernia non reducible  Extremities: No edema, pulses present, brace in left arm  Neurologic: GCS [id number], sedated  Psych: sedated    Lab Data Reviewed: (see below)    Medications Reviewed: (see below)    PMH/SH reviewed - no change compared to H&P  ______________________________________________________________________  Total time spent with patient: 45 minutes     Critical Care Provided  Minutes non procedure based    Care Plan discussed with:  Patient    Family    RN x   Care Manager    Consultant/Specialist      >50% of visit spent in counseling and coordination of care       Prophylaxis:  GI PPI   DVT lovenox      Disposition:   Home with Family    HH/PT/OT/RN    SNF/LTC    [facility]      ______________________________________________________________________  Attending Physician: Eric DOROTHA Deutscher, MD   _____________________________________________________________________________________________________  Procedures: see electronic medical records for all procedures/Xrays and details  which were not copied into this note but were reviewed prior to creation of Plan.      LABS:  Recent Labs   Basename 05/05/09 0530 05/04/09 0325   ? WBC 12.0* 14.6*   ? HGB 10.0* 9.7*   ? HCT 32.5* 32.2*   ? PLT 94* 118*     Recent Labs   Basename 05/05/09 0530 05/04/09 0325 05/03/09 1533 05/03/09 0255   ? NA 159* 148* 162* --   ? K 3.1* 3.5 4.7 --   ? CL 125* 116* 128* --   ? CO2 24 20* 22 --   ? BUN 44* 26* 24* --   ? CREA 1.8* 1.5*  1.3 --   ? GLU 165* 483* 108* --   ? CA 8.2* 8.3* 9.7 --   ? MG 2.6* 2.2 -- 2.2   ? PHOS 3.0 -- -- --   ? URICA -- -- -- --     Recent Labs   Basename 05/05/09 0530 05/03/09 0255   ? SGOT 170* 159*   ? GPT 108* 61   ? AP 144* 197*   ? TBIL 4.5* 4.8*   ? TP 6.0* 6.8   ? ALB 2.4* 3.3*   ? GLOB 3.6 3.5   ? GGT -- --   ? AML -- --   ? LPSE -- 59*     Recent Labs   Basename 05/05/09 0530   ? INR 3.0*   ? PTP 28.9*   ? APTT --         No results found for this basename: FE:2,TIBC:2,PSAT:2,FERR:2, in the last 72 hours   Recent Labs   Basename 05/05/09 0409 05/04/09 0815   ? PH 7.46* 7.47*   ? PCO2 31* 31*   ? PO2 72* 507*     Recent Labs   Basename 05/05/09 0530 05/04/09 1650 05/04/09 0325   ? CPK 1388* 2222* 5316*   ? CKMB -- -- --     Lab Results   Component Value Date/Time    POC GLUCOSE 114 05/01/2009  4:40 PM    POC GLUCOSE 162 05/01/2009 12:02 PM    POC GLUCOSE 115 05/01/2009  8:39 AM     Lab Results   Component Value Date/Time    Color YELLOW 04/30/2009 12:14 AM    Appearance CLEAR 04/30/2009 12:14 AM    Specific gravity 1.010 10/29/2008 11:00 AM    Specific gravity 1.011 04/30/2009 12:14 AM    pH 6.0 04/30/2009 12:14 AM    Protein NEGATIVE  04/30/2009 12:14 AM    Glucose NEGATIVE  04/30/2009 12:14 AM    Ketone NEGATIVE  04/30/2009 12:14 AM    Bilirubin NEGATIVE  04/30/2009 12:14 AM    Urobilinogen 2.0 04/30/2009 12:14 AM    Nitrites NEGATIVE  04/30/2009 12:14 AM    Leukocyte Esterase TRACE 04/30/2009  12:14 AM    Epithelial cells 10-20 04/30/2009 12:14 AM    Bacteria NEGATIVE  04/30/2009 12:14 AM    WBC 0-4 04/30/2009 12:14 AM    RBC 0-3 04/30/2009 12:14 AM     MEDICATIONS:  Current facility-administered medications   Medication Dose Route Frequency   ? propofol (DIPRIVAN) 10 mg/mL injection        ? dextrose 5% - 0.45% NaCl with KCl 20 mEq/L infusion    IntraVENous CONTINUOUS   ? sodium chloride  0.9 % bolus infusion 500 mL  500 mL IntraVENous ONCE   ? lactulose (CHRONULAC) solution 20 g  20 g Oral BID   ? aspirin (ASA) suppository 300 mg   300 mg Rectal DAILY   ? propofol (DIPRIVAN) infusion  5-50 mcg/kg/min IntraVENous TITRATE   ? morphine  injection 1 mg  1 mg IntraVENous PRN   ? pantoprazole  (PROTONIX ) injection 40 mg  40 mg IntraVENous DAILY   ? levofloxacin (LEVAQUIN) 750 mg infusion   750 mg IntraVENous Q48H   ? piperacillin-tazobactam (ZOSYN) 2.25 g in 0.9% sodium chloride  (MBP/ADV) 50 mL MBP  2.25 g IntraVENous Q8H   ? albuterol /ipratropium (DUONEB ) neb solution   1 Dose Nebulization Q4HWA RT   ? vancomycin (VANCOCIN) 1.25 g in 0.9% sodium chloride  250 mL IVPB  1,250 mg IntraVENous Q24H   ? midazolam (VERSED) injection 2-4 mg  2-4 mg IntraVENous Q1H PRN   ? metoprolol  tartrate (LOPRESSOR ) injection 5 mg  5 mg IntraVENous Q6H   ? enoxaparin  (LOVENOX ) injection 60 mg  60 mg SubCUTAneous Q12H   ? lorazepam (ATIVAN) injection 1-2 mg  1-2 mg IntraVENous Q6H PRN   ? sodium chloride  (NS) flush 10 mL  10 mL InterCATHeter Q8H   ? sodium chloride  (NS) flush 10 mL  10 mL InterCATHeter PRN   ? heparin (porcine) pf 300 Units  300 Units InterCATHeter PRN   ? sodium chloride  (NS) flush 20 mL  20 mL InterCATHeter PRN   ? propofol (DIPRIVAN) 10 mg/mL injection        ? DISCONTD: 0.45% sodium chloride  infusion    IntraVENous CONTINUOUS   ? DISCONTD: midazolam (VERSED) injection 2 mg  2 mg IntraVENous Q2H PRN   ? DISCONTD: midazolam (VERSED) 1 mg/mL injection        ? DISCONTD: 0.45% sodium chloride  infusion    IntraVENous CONTINUOUS   ? DISCONTD: enoxaparin  (LOVENOX ) injection 40 mg  40 mg SubCUTAneous DAILY   ? acetaminophen  (TYLENOL ) suppository 650 mg  650 mg Rectal Q6H PRN   ? albuterol  (PROVENTIL  VENTOLIN ) nebulizer solution 2.5 mg  2.5 mg Nebulization Q2H PRN   ? thiamine (B-1) 100 mg in 0.9% sodium chloride  50 mL IVPB  100 mg IntraVENous DAILY   ? nitroglycerin (NITROBID) 2 % ointment 2 Inch  2 Inch Topical Q6H PRN   ? DISCONTD: piperacillin-tazobactam (ZOSYN) 3.375 g in 0.9% sodium chloride  (MBP/ADV) 100 mL MBP  3.375 g IntraVENous Q6H   ? DISCONTD:  lactulose (CHRONULAC) solution 200 g  300 mL Rectal BID   ? DISCONTD: albuterol  (PROVENTIL  VENTOLIN ) nebulizer solution 2.5 mg  2.5 mg Nebulization QID   ? DISCONTD: ipratropium (ATROVENT) 0.02 % nebulizer solution 0.5 mg  0.5 mg Nebulization QID   ? DISCONTD: levofloxacin (LEVAQUIN) 750 mg infusion   750 mg IntraVENous Q24H   ? DISCONTD: enoxaparin  (LOVENOX ) injection 40 mg  40 mg SubCUTAneous Q24H   ? SALINE PERIPHERAL FLUSH Q8H 5 mL  5 mL InterCATHeter Q8H   ?  saline peripheral flush 5 mL  5 mL InterCATHeter PRN       Electronically signed by Andris Eric PARAS, MD at 05/05/2009  7:42 AM EDT

## 2009-05-05 NOTE — Progress Notes (Signed)
 Formatting of this note might be different from the original.  Incontinent of large amount of liquid/watery green stool, cleaned, turned and repositioned.  Extra protective ointment applied to rectum.  Patient calm/relaxed unless stimulated  VSS, will monitor.  Electronically signed by Elena Suzen ORN, RN at 05/05/2009  6:37 PM EDT

## 2009-05-05 NOTE — Consults (Signed)
 Formatting of this note might be different from the original.  Nutrition Consult Recommendations:   TF via OGT: Standard, High Fiber (Jevity 1.5) start at 15mL/hr continuously   Advance 10 mL/hr after 12 hours if tolerated to goal   Goal: Standard, High Fiber (Jevity 1.5) at 25 mL/hr continuously   Flushes per MD 2' hypernatremia   HOB >30 degrees   Strict aspiration precautions   Goal provides: 900 kcal, 38g Pro, 456 mL H2O (1256 mL H2O with current flushes; 1725 kcal, 38g Pro with current rate of D5 and propofol in addition to TF)    Progress Note:  Pt now intubated, sedated in ICU.  Currently with kcals provided from propofol (current rate 15.8 ml/hr) and D5 IVF started this AM (at 183ml/hr) providing: 825 kcal, 0g Pro.  Pt on diet when first admitted, but has been NPO/Clears since 9/12 although no intake noted when pt on diet.    Pt with +BMs from lactulose, noted NH3 to be trending down.  K+ depleted this AM, likely related to lactulose; Mag slightly elevated this AM, Phos WDL.  Pt with hypernatremia--MD started free H2O flushes via OGT tube (200ml Q6hr).  Currently OGT to [facility], spoke with nsg, tube will be clamped from suction as with other meds.    Goal if D5 and/or propofol decreased: Jevity 1.5 at 40 ml/hr (Goal provides: 1440 kcal, 61g Pro, H2O (1530 mL with current H2O flushes)    Pt's BMI:  Estimated Body mass index is 26.49 kg/(m^2) as calculated from the following:    Height as of this encounter: 5' 2(1.575 m).    Weight as of this encounter: 144 lb 13.5 oz(65.7 kg).  Based on the above BMI, pt is overweight.    Thank you for consult, Dr. Andris Morna Solomons, RD  Pager [phone]    Electronically signed by Solomons Morna BROCKS, RD at 05/05/2009  8:57 AM EDT

## 2009-05-05 NOTE — Progress Notes (Signed)
 Formatting of this note is different from the original.  Neurology Progress Note    Patient ID:  Breanna Lester  [id number]  48 y.o.  04-18-1961    Subjective:     Patient none..    Current facility-administered medications   Medication Dose Route Frequency   ? propofol (DIPRIVAN) 10 mg/mL injection        ? sodium chloride  0.9 % bolus infusion 500 mL  500 mL IntraVENous ONCE   ? lactulose (CHRONULAC) solution 20 g  20 g Oral BID   ? aspirin (ASPIRIN) tablet 325 mg  325 mg Oral DAILY   ? potassium chloride  20 mEq in 50 ml IVPB   20 mEq IntraVENous Q1H   ? dextrose 5% with KCl 20 mEq/L infusion    IntraVENous CONTINUOUS   ? albumin human 5% (BUMINATE) solution 25 g  25 g IntraVENous Q4H PRN   ? vancomycin (VANCOCIN) 1 g in 0.9% sodium chloride  250 mL IVPB  1,000 mg IntraVENous Q24H   ? metoprolol  tartrate (LOPRESSOR ) injection 2.5 mg  2.5 mg IntraVENous Q6H   ? DISCONTD: dextrose 5% - 0.45% NaCl with KCl 20 mEq/L infusion    IntraVENous CONTINUOUS   ? propofol (DIPRIVAN) infusion  5-50 mcg/kg/min IntraVENous TITRATE   ? morphine  injection 1 mg  1 mg IntraVENous PRN   ? pantoprazole  (PROTONIX ) injection 40 mg  40 mg IntraVENous DAILY   ? levofloxacin (LEVAQUIN) 750 mg infusion   750 mg IntraVENous Q48H   ? piperacillin-tazobactam (ZOSYN) 2.25 g in 0.9% sodium chloride  (MBP/ADV) 50 mL MBP  2.25 g IntraVENous Q8H   ? albuterol /ipratropium (DUONEB ) neb solution   1 Dose Nebulization Q4HWA RT   ? midazolam (VERSED) injection 2-4 mg  2-4 mg IntraVENous Q1H PRN   ? enoxaparin  (LOVENOX ) injection 60 mg  60 mg SubCUTAneous Q12H   ? lorazepam (ATIVAN) injection 1-2 mg  1-2 mg IntraVENous Q6H PRN   ? sodium chloride  (NS) flush 10 mL  10 mL InterCATHeter Q8H   ? sodium chloride  (NS) flush 10 mL  10 mL InterCATHeter PRN   ? heparin (porcine) pf 300 Units  300 Units InterCATHeter PRN   ? sodium chloride  (NS) flush 20 mL  20 mL InterCATHeter PRN   ? propofol (DIPRIVAN) 10 mg/mL injection        ? DISCONTD: aspirin (ASA) suppository 300  mg  300 mg Rectal DAILY   ? DISCONTD: 0.45% sodium chloride  infusion    IntraVENous CONTINUOUS   ? DISCONTD: vancomycin (VANCOCIN) 1.25 g in 0.9% sodium chloride  250 mL IVPB  1,250 mg IntraVENous Q24H   ? DISCONTD: metoprolol  tartrate (LOPRESSOR ) injection 5 mg  5 mg IntraVENous Q6H   ? acetaminophen  (TYLENOL ) suppository 650 mg  650 mg Rectal Q6H PRN   ? albuterol  (PROVENTIL  VENTOLIN ) nebulizer solution 2.5 mg  2.5 mg Nebulization Q2H PRN   ? thiamine (B-1) 100 mg in 0.9% sodium chloride  50 mL IVPB  100 mg IntraVENous DAILY   ? nitroglycerin (NITROBID) 2 % ointment 2 Inch  2 Inch Topical Q6H PRN   ? DISCONTD: lactulose (CHRONULAC) solution 200 g  300 mL Rectal BID   ? saline peripheral flush 5 mL  5 mL InterCATHeter PRN   ? DISCONTD: SALINE PERIPHERAL FLUSH Q8H 5 mL  5 mL InterCATHeter Q8H         Review of Systems:    Review of systems not obtained due to patient factors.    Objective:     Patient  Vitals in the past 8 hrs:   BP Temp Pulse Resp SpO2 Height Weight   05/05/09 1600 97/44 mmHg 99.5 F (37.5 C) 91  25  99 % - -   05/05/09 1549 - - 90  - 98 % - -   05/05/09 1500 89/39 mmHg - 89  26  99 % - -   05/05/09 1400 95/44 mmHg - 84  22  100 % - -   05/05/09 1330 90/42 mmHg - 84  23  99 % - -   05/05/09 1322 90/43 mmHg - 83  23  100 % - -   05/05/09 1315 86/41 mmHg - 82  23  99 % - -   05/05/09 1310 85/43 mmHg - 81  22  99 % - -   05/05/09 1300 85/41 mmHg 97.8 F (36.6 C) 81  21  99 % - -   05/05/09 1245 90/42 mmHg - 79  21  99 % - -   05/05/09 1230 99/45 mmHg - 81  25  100 % - -   05/05/09 1215 96/52 mmHg - 83  17  99 % - -   05/05/09 1200 87/37 mmHg 98 F (36.7 C) 80  22  99 % - -   05/05/09 1145 90/40 mmHg - 81  22  99 % - -   05/05/09 1130 89/40 mmHg - 79  22  98 % - -   05/05/09 1123 - - 79  - 98 % - -   05/05/09 1115 92/42 mmHg - 80  23  98 % - -   05/05/09 1100 86/43 mmHg - 79  23  98 % - -   05/05/09 1045 88/40 mmHg - 78  25  98 % - -   05/05/09 1030 89/40 mmHg - 80  23  97 % - -   05/05/09 1021 85/41  mmHg - 79  23  97 % - -   05/05/09 1015 83/40 mmHg - 81  21  97 % - -   05/05/09 1007 85/40 mmHg - 81  22  98 % - -   05/05/09 1000 82/39 mmHg - 82  22  98 % - -   05/05/09 0838 - - 98  - 99 % - -   05/05/09 9166 - - - - - 5' 2 (1.575 m) 144 lb 13.5 oz (65.7 kg)     In: 1331.2 (1131.2 I.V.)  Out: 211 (211 Urine)    In: 4639 (4489 I.V.)  Out: 3633 (3133 Urine)      Lab Review Recent Results (from the past 24 hour(s))   CK W/ CKMB & INDEX    Collection Time    05/04/09  4:50 PM   Component Value Range   ? CK 2222 (*) 21 - 215 (U/L)   ? CK - MB 7.4 (*) 0.5 - 3.6 (NG/ML)   ? CK-MB Index 0.3  0 - 2.5 ( )   TROPONIN I    Collection Time    05/04/09  4:50 PM   Component Value Range   ? Troponin-I, Qt. 19.40 (*) <0.05 (ng/mL)   BLOOD GAS, ARTERIAL    Collection Time    05/05/09  4:09 AM   Component Value Range   ? pH 7.46 (*) 7.35 - 7.45 ( )   ? PCO2 31 (*) 35 - 45 (mmHg)   ? PO2 72 (*) 80 - 100 (mmHg)   ? O2 SAT 95  92 - 97 (%)   ? BICARBONATE 21 (*) 22 - 26 (mmol/L)   ? BASE DEFICIT 1.3 (*) BE NORMAL RANGE -3 T (mmol/L)   ? FIO2 50  (%)   ? SITE RR     ? MODE A/C     ? SET RATE 10  ( )   ? VT/PIP 500  ( )   ? PEEP/CPAP 5  ( )   ? SAMPLE SOURCE ARTERIAL     ? O2 METHOD VENTILATOR     CBC WITH AUTOMATED DIFF    Collection Time    05/05/09  5:30 AM   Component Value Range   ? WBC 12.0 (*) 3.6 - 11.0 (K/uL)   ? RBC 3.74 (*) 3.80 - 5.20 (M/uL)   ? HGB 10.0 (*) 11.5 - 16.0 (g/dL)   ? HCT 32.5 (*) 35.0 - 47.0 (%)   ? MCV 86.9  80.0 - 99.0 (FL)   ? MCH 26.7  26.0 - 34.0 (PG)   ? MCHC 30.8  30.0 - 36.5 (g/dL)   ? RDW 19.4 (*) 11.5 - 14.5 (%)   ? PLATELET 94 (*) 150 - 400 (K/uL)   ? NEUTROPHILS 82 (*) 32 - 75 (%)   ? LYMPHOCYTES 12  12 - 49 (%)   ? MONOCYTES 6  5 - 13 (%)   ? EOSINOPHILS 0  0 - 7 (%)   ? BASOPHILS 0  0 - 1 (%)   ? ABSOLUTE NEUTS 9.7 (*) 1.8 - 8.0 (K/UL)   ? ABSOLUTE LYMPHS 1.5  0.8 - 3.5 (K/UL)   ? ABSOLUTE MONOS 0.8  0.0 - 1.0 (K/UL)   ? ABSOLUTE EOSINS 0.1  0.0 - 0.4 (K/UL)   ? ABSOLUTE BASOS 0.0  0.0 - 0.1 (K/UL)    METABOLIC PANEL, COMPREHENSIVE    Collection Time    05/05/09  5:30 AM   Component Value Range   ? Sodium 159 (*) 136 - 145 (MMOL/L)   ? Potassium 3.1 (*) 3.5 - 5.1 (MMOL/L)   ? Chloride 125 (*) 97 - 108 (MMOL/L)   ? CO2 24  21 - 32 (MMOL/L)   ? Anion gap 10  5 - 15 (mmol/L)   ? Glucose 165 (*) 65 - 100 (MG/DL)   ? BUN 44 (*) 6 - 20 (MG/DL)   ? Creatinine 1.8 (*) 0.6 - 1.3 (MG/DL)   ? BUN/Creatinine ratio 24 (*) 12 - 20 ( )   ? GFR est AA 39 (*) >60 (ml/min/1.62m2)   ? GFR est non-AA 32 (*) >60 (ml/min/1.64m2)   ? Calcium 8.2 (*) 8.5 - 10.1 (MG/DL)   ? Bilirubin, total 4.5 (*) 0.2 - 1.0 (MG/DL)   ? ALT 108 (*) 12 - 78 (U/L)   ? AST 170 (*) 15 - 37 (U/L)   ? Alk. phosphatase 144 (*) 50 - 136 (U/L)   ? Protein, total 6.0 (*) 6.4 - 8.2 (g/dL)   ? Albumin 2.4 (*) 3.5 - 5.0 (g/dL)   ? Globulin 3.6  2.0 - 4.0 (g/dL)   ? A-G Ratio 0.7 (*) 1.1 - 2.2 ( )   PHOSPHORUS    Collection Time    05/05/09  5:30 AM   Component Value Range   ? Phosphorus 3.0  2.5 - 4.9 (MG/DL)   MAGNESIUM     Collection Time    05/05/09  5:30 AM   Component Value Range   ? Magnesium  2.6 (*) 1.6 - 2.4 (MG/DL)  TSH, 3RD GENERATION    Collection Time    05/05/09  5:30 AM   Component Value Range   ? TSH, 3rd generation <0.01 (*) 0.36 - 3.74 (UIU/ML)   T3, FREE    Collection Time    05/05/09  5:30 AM   Component Value Range   ? Free Triiodothyronine 3.3  2.2 - 4.0 (pg/mL)   T4    Collection Time    05/05/09  5:30 AM   Component Value Range   ? T4 11.8  4.8 - 13.9 (ug/dL)   CK    Collection Time    05/05/09  5:30 AM   Component Value Range   ? CK 1388 (*) 21 - 215 (U/L)   AMMONIA    Collection Time    05/05/09  5:30 AM   Component Value Range   ? Ammonia 45 (*) <32 (UMOL/L)   VITAMIN B12 & FOLATE    Collection Time    05/05/09  5:30 AM   Component Value Range   ? Vitamin B12 1745 (*) 254 - 1320 (pg/mL)   ? Folate 9.4  5.0 - 21.0 (ng/mL)   SED RATE (ESR)    Collection Time    05/05/09  5:30 AM   Component Value Range   ? Sed rate (ESR) 15  0 - 20 (MM/HR)    COAGULATION SCREEN    Collection Time    05/05/09  5:30 AM   Component Value Range   ? INR 3.0 (*) 0.9 - 1.1 ( )   ? Prothrombin Time-PT 28.9 (*) 9.0 - 11.0 (SECS)   ? aPTT 32.6  24.0 - 33.0 (sec)   ? Fibrinogen 244  200 - 475 (mg/dL)   TROPONIN I    Collection Time    05/05/09  5:30 AM   Component Value Range   ? Troponin-I, Qt. 10.16 (*) <0.05 (ng/mL)   EKG, 12 LEAD, INITIAL    Collection Time    05/05/09  6:32 AM   Component Value Range   ? Ventricular Rate 89  (BPM)   ? Atrial Rate 89  (BPM)   ? P-R Interval 122  (ms)   ? QRS Duration 84  (ms)   ? Q-T Interval 440  (ms)   ? QTC Calculation (Bezet) 535  (ms)   ? Calculated P Axis 42  (degrees)   ? Calculated R Axis 32  (degrees)   ? Calculated T Axis 173  (degrees)   ? Diagnosis        Value: Normal sinus rhythm      T wave abnormality, consider anterolateral ischemia      Prolonged QT      When compared with ECG of 04-May-2009 09:57,      MANUAL COMPARISON REQUIRED, DATA IS UNCONFIRMED      Confirmed by Alverda Rogue ([id number]) on 05/05/2009 9:42:15 AM     Additional comments:I reviewed the patient's new clinical lab test results. all    NEUROLOGICAL EXAM:    Appearance:  The patient is well developed, well nourished, intubated and sedated   Mental Status: Intubated and sedated.   Cranial Nerves:   Non testable visual fields. Fundi are benign. PERLA, EOM's full, no nystagmus, no ptosis. Facial sensation is normal. Corneal reflexes are intact. Facial movement is symmetric.   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 No fasciculations.   Reflexes:   Deep tendon reflexes 1+/4 and symmetrical.   Sensory:   No response to  pain.   Gait:  Not testable.   Tremor:   No tremor noted.   Cerebellar:  No cerebellar signs present.   Neurovascular:  Normal heart sounds and regular rhythm, peripheral pulses intact, and no carotid bruits.     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 Claudene, MD  05/05/2009  4:08 PM    Electronically signed by Claudene Debby LABOR, MD at 05/05/2009  4:12 PM EDT

## 2009-05-05 NOTE — Progress Notes (Signed)
 Formatting of this note might be different from the original.  Dr. Vonzell on floor, informed of patient's BP ranging 87-90 systolic. Sedation on standby, post albumin 500ml infusion.  Advised to monitor, if BP cont'd 87 systolic >1 hour, call for orders. 90/48 at present.  Electronically signed by Elena Suzen ORN, RN at 05/05/2009  1:59 PM EDT

## 2009-05-05 NOTE — Progress Notes (Signed)
 Formatting of this note might be different from the original.  Pharmacokinetic Dosing Note    S/O: NT is a 5 yoF who is currently being treated with Vancomycin for sepsis syndrome with possible underlying PNA.   Ht 63  Wt 73.8 kg  9/13 Scr 1.3 BUN 24  9/14 Scr 1.5 BUN 26  9/15 Scr 1.8 BUN 44 Est CrCl 34 ml/min     WBC 12    A/P: Will adjust Vancomycin secondary to substantial increase in Scr (from 1.3 to 1.8).     Vancomycin 1 gm IV every 24 hours, first dose due at 1800 tonight.   Goal 15 - 20   Pharmacy to follow.    Thank you,  Kirke IVAR Hays, PharmD    Electronically signed by Hays Kirke DASEN, PHARMD at 05/05/2009 11:46 AM EDT

## 2009-05-05 NOTE — Progress Notes (Signed)
 Formatting of this note is different from the original.  Cardiology Progress Note    05/05/2009 1:44 PM    Admit Date: 04/29/2009    Subjective:     Remains critically ill, intubated, sedated on vent.  BP has dropped (metoprolol  held).  Cardiac markers c/w NSTEMI, have peaked and now going down.    BP 85/43  Pulse 81  Temp 97.8 F (36.6 C)  Resp 22  Ht 5' 2 (1.575 m)  Wt 144 lb 13.5 oz (65.7 kg)  SpO2 99%  PF 65 L/min  Current facility-administered medications   Medication Dose Route Frequency   ? propofol (DIPRIVAN) 10 mg/mL injection        ? sodium chloride  0.9 % bolus infusion 500 mL  500 mL IntraVENous ONCE   ? lactulose (CHRONULAC) solution 20 g  20 g Oral BID   ? aspirin (ASPIRIN) tablet 325 mg  325 mg Oral DAILY   ? potassium chloride  20 mEq in 50 ml IVPB   20 mEq IntraVENous Q1H   ? dextrose 5% with KCl 20 mEq/L infusion    IntraVENous CONTINUOUS   ? albumin human 5% (BUMINATE) solution 25 g  25 g IntraVENous Q4H PRN   ? vancomycin (VANCOCIN) 1 g in 0.9% sodium chloride  250 mL IVPB  1,000 mg IntraVENous Q24H   ? metoprolol  tartrate (LOPRESSOR ) injection 2.5 mg  2.5 mg IntraVENous Q6H   ? DISCONTD: dextrose 5% - 0.45% NaCl with KCl 20 mEq/L infusion    IntraVENous CONTINUOUS   ? propofol (DIPRIVAN) infusion  5-50 mcg/kg/min IntraVENous TITRATE   ? morphine  injection 1 mg  1 mg IntraVENous PRN   ? pantoprazole  (PROTONIX ) injection 40 mg  40 mg IntraVENous DAILY   ? levofloxacin (LEVAQUIN) 750 mg infusion   750 mg IntraVENous Q48H   ? piperacillin-tazobactam (ZOSYN) 2.25 g in 0.9% sodium chloride  (MBP/ADV) 50 mL MBP  2.25 g IntraVENous Q8H   ? albuterol /ipratropium (DUONEB ) neb solution   1 Dose Nebulization Q4HWA RT   ? midazolam (VERSED) injection 2-4 mg  2-4 mg IntraVENous Q1H PRN   ? enoxaparin  (LOVENOX ) injection 60 mg  60 mg SubCUTAneous Q12H   ? lorazepam (ATIVAN) injection 1-2 mg  1-2 mg IntraVENous Q6H PRN   ? sodium chloride  (NS) flush 10 mL  10 mL InterCATHeter Q8H   ? sodium chloride  (NS)  flush 10 mL  10 mL InterCATHeter PRN   ? heparin (porcine) pf 300 Units  300 Units InterCATHeter PRN   ? sodium chloride  (NS) flush 20 mL  20 mL InterCATHeter PRN   ? propofol (DIPRIVAN) 10 mg/mL injection        ? DISCONTD: aspirin (ASA) suppository 300 mg  300 mg Rectal DAILY   ? DISCONTD: 0.45% sodium chloride  infusion    IntraVENous CONTINUOUS   ? DISCONTD: vancomycin (VANCOCIN) 1.25 g in 0.9% sodium chloride  250 mL IVPB  1,250 mg IntraVENous Q24H   ? DISCONTD: metoprolol  tartrate (LOPRESSOR ) injection 5 mg  5 mg IntraVENous Q6H   ? acetaminophen  (TYLENOL ) suppository 650 mg  650 mg Rectal Q6H PRN   ? albuterol  (PROVENTIL  VENTOLIN ) nebulizer solution 2.5 mg  2.5 mg Nebulization Q2H PRN   ? thiamine (B-1) 100 mg in 0.9% sodium chloride  50 mL IVPB  100 mg IntraVENous DAILY   ? nitroglycerin (NITROBID) 2 % ointment 2 Inch  2 Inch Topical Q6H PRN   ? DISCONTD: lactulose (CHRONULAC) solution 200 g  300 mL Rectal BID   ? saline peripheral  flush 5 mL  5 mL InterCATHeter PRN   ? DISCONTD: SALINE PERIPHERAL FLUSH Q8H 5 mL  5 mL InterCATHeter Q8H         Objective:     Physical Exam:  Comatose, on vent    Chest--clear  CV--rrr nl S1S2 II/VI sem  Abd--nl BS, non-tender  Ext--no change  Neuro--comatose, unresponsive to deep pain    Data Review:   Labs:  Recent Results (from the past 24 hour(s))   CK W/ CKMB & INDEX    Collection Time    05/04/09  4:50 PM   Component Value Range   ? CK 2222 (*) 21 - 215 (U/L)   ? CK - MB 7.4 (*) 0.5 - 3.6 (NG/ML)   ? CK-MB Index 0.3  0 - 2.5 ( )   TROPONIN I    Collection Time    05/04/09  4:50 PM   Component Value Range   ? Troponin-I, Qt. 19.40 (*) <0.05 (ng/mL)   BLOOD GAS, ARTERIAL    Collection Time    05/05/09  4:09 AM   Component Value Range   ? pH 7.46 (*) 7.35 - 7.45 ( )   ? PCO2 31 (*) 35 - 45 (mmHg)   ? PO2 72 (*) 80 - 100 (mmHg)   ? O2 SAT 95  92 - 97 (%)   ? BICARBONATE 21 (*) 22 - 26 (mmol/L)   ? BASE DEFICIT 1.3 (*) BE NORMAL RANGE -3 T (mmol/L)   ? FIO2 50  (%)   ? SITE RR     ?  MODE A/C     ? SET RATE 10  ( )   ? VT/PIP 500  ( )   ? PEEP/CPAP 5  ( )   ? SAMPLE SOURCE ARTERIAL     ? O2 METHOD VENTILATOR     CBC WITH AUTOMATED DIFF    Collection Time    05/05/09  5:30 AM   Component Value Range   ? WBC 12.0 (*) 3.6 - 11.0 (K/uL)   ? RBC 3.74 (*) 3.80 - 5.20 (M/uL)   ? HGB 10.0 (*) 11.5 - 16.0 (g/dL)   ? HCT 32.5 (*) 35.0 - 47.0 (%)   ? MCV 86.9  80.0 - 99.0 (FL)   ? MCH 26.7  26.0 - 34.0 (PG)   ? MCHC 30.8  30.0 - 36.5 (g/dL)   ? RDW 19.4 (*) 11.5 - 14.5 (%)   ? PLATELET 94 (*) 150 - 400 (K/uL)   ? NEUTROPHILS 82 (*) 32 - 75 (%)   ? LYMPHOCYTES 12  12 - 49 (%)   ? MONOCYTES 6  5 - 13 (%)   ? EOSINOPHILS 0  0 - 7 (%)   ? BASOPHILS 0  0 - 1 (%)   ? ABSOLUTE NEUTS 9.7 (*) 1.8 - 8.0 (K/UL)   ? ABSOLUTE LYMPHS 1.5  0.8 - 3.5 (K/UL)   ? ABSOLUTE MONOS 0.8  0.0 - 1.0 (K/UL)   ? ABSOLUTE EOSINS 0.1  0.0 - 0.4 (K/UL)   ? ABSOLUTE BASOS 0.0  0.0 - 0.1 (K/UL)   METABOLIC PANEL, COMPREHENSIVE    Collection Time    05/05/09  5:30 AM   Component Value Range   ? Sodium 159 (*) 136 - 145 (MMOL/L)   ? Potassium 3.1 (*) 3.5 - 5.1 (MMOL/L)   ? Chloride 125 (*) 97 - 108 (MMOL/L)   ? CO2 24  21 - 32 (MMOL/L)   ?  Anion gap 10  5 - 15 (mmol/L)   ? Glucose 165 (*) 65 - 100 (MG/DL)   ? BUN 44 (*) 6 - 20 (MG/DL)   ? Creatinine 1.8 (*) 0.6 - 1.3 (MG/DL)   ? BUN/Creatinine ratio 24 (*) 12 - 20 ( )   ? GFR est AA 39 (*) >60 (ml/min/1.13m2)   ? GFR est non-AA 32 (*) >60 (ml/min/1.43m2)   ? Calcium 8.2 (*) 8.5 - 10.1 (MG/DL)   ? Bilirubin, total 4.5 (*) 0.2 - 1.0 (MG/DL)   ? ALT 108 (*) 12 - 78 (U/L)   ? AST 170 (*) 15 - 37 (U/L)   ? Alk. phosphatase 144 (*) 50 - 136 (U/L)   ? Protein, total 6.0 (*) 6.4 - 8.2 (g/dL)   ? Albumin 2.4 (*) 3.5 - 5.0 (g/dL)   ? Globulin 3.6  2.0 - 4.0 (g/dL)   ? A-G Ratio 0.7 (*) 1.1 - 2.2 ( )   PHOSPHORUS    Collection Time    05/05/09  5:30 AM   Component Value Range   ? Phosphorus 3.0  2.5 - 4.9 (MG/DL)   MAGNESIUM     Collection Time    05/05/09  5:30 AM   Component Value Range   ? Magnesium   2.6 (*) 1.6 - 2.4 (MG/DL)   TSH, 3RD GENERATION    Collection Time    05/05/09  5:30 AM   Component Value Range   ? TSH, 3rd generation <0.01 (*) 0.36 - 3.74 (UIU/ML)   T3, FREE    Collection Time    05/05/09  5:30 AM   Component Value Range   ? Free Triiodothyronine 3.3  2.2 - 4.0 (pg/mL)   T4    Collection Time    05/05/09  5:30 AM   Component Value Range   ? T4 11.8  4.8 - 13.9 (ug/dL)   CK    Collection Time    05/05/09  5:30 AM   Component Value Range   ? CK 1388 (*) 21 - 215 (U/L)   AMMONIA    Collection Time    05/05/09  5:30 AM   Component Value Range   ? Ammonia 45 (*) <32 (UMOL/L)   VITAMIN B12 & FOLATE    Collection Time    05/05/09  5:30 AM   Component Value Range   ? Vitamin B12 1745 (*) 254 - 1320 (pg/mL)   ? Folate 9.4  5.0 - 21.0 (ng/mL)   SED RATE (ESR)    Collection Time    05/05/09  5:30 AM   Component Value Range   ? Sed rate (ESR) 15  0 - 20 (MM/HR)   COAGULATION SCREEN    Collection Time    05/05/09  5:30 AM   Component Value Range   ? INR 3.0 (*) 0.9 - 1.1 ( )   ? Prothrombin Time-PT 28.9 (*) 9.0 - 11.0 (SECS)   ? aPTT 32.6  24.0 - 33.0 (sec)   ? Fibrinogen 244  200 - 475 (mg/dL)   TROPONIN I    Collection Time    05/05/09  5:30 AM   Component Value Range   ? Troponin-I, Qt. 10.16 (*) <0.05 (ng/mL)   EKG, 12 LEAD, INITIAL    Collection Time    05/05/09  6:32 AM   Component Value Range   ? Ventricular Rate 89  (BPM)   ? Atrial Rate 89  (BPM)   ? P-R Interval 122  (ms)   ?  QRS Duration 84  (ms)   ? Q-T Interval 440  (ms)   ? QTC Calculation (Bezet) 535  (ms)   ? Calculated P Axis 42  (degrees)   ? Calculated R Axis 32  (degrees)   ? Calculated T Axis 173  (degrees)   ? Diagnosis        Value: Normal sinus rhythm      T wave abnormality, consider anterolateral ischemia      Prolonged QT      When compared with ECG of 04-May-2009 09:57,      MANUAL COMPARISON REQUIRED, DATA IS UNCONFIRMED      Confirmed by Alverda Rogue ([id number]) on 05/05/2009 9:42:15 AM     Telemetry: normal sinus rhythm    Assessment:      Patient Active Hospital Problem List:  Nausea with vomiting (05/01/2009)    Hypokalemia (05/01/2009)    Hepatic encephalopathy (05/01/2009)    Gait abnormality (05/01/2009)    Alcoholic cirrhosis of liver (05/01/2009)    Hypomagnesemia (05/01/2009)    Hypoxic respiratory failure--intubated/sedated/ on vent    NSTEMI--trop , cpk and MB have peaked and are now going down.      Probable pneumonia  Plan:     Options limited  Lovenox , ASA, IV metoprolol  as tolerated  Vent/resp/hemodynamic support  Agree with other plans outlined  Eventual cath if stabilizes/recovers    Norleen MICAEL Gelineau, MD      Electronically signed by Gelineau Norleen ORN, MD at 05/05/2009  1:52 PM EDT

## 2009-05-05 NOTE — Progress Notes (Signed)
 Formatting of this note might be different from the original.  Propofol placed on standby BP 85/41.  Albumin x2 completed.  Will monitor.  Electronically signed by Elena Suzen ORN, RN at 05/05/2009  1:05 PM EDT

## 2009-05-05 NOTE — Progress Notes (Signed)
 Formatting of this note might be different from the original.  Versed 2mg  IV for agitation, propofol  at 5mcg.  Will monitor. Systolic BP 100.    Electronically signed by Elena Suzen ORN, RN at 05/05/2009  4:33 PM EDT

## 2009-05-05 NOTE — Progress Notes (Signed)
 Formatting of this note might be different from the original.  Cooling blanket back on, temp 99.9 rectally. EEG in progress.  Patient appears comfortable/relaxed.  Electronically signed by Elena Suzen ORN, RN at 05/05/2009  4:32 PM EDT

## 2009-05-05 NOTE — Progress Notes (Signed)
 Formatting of this note might be different from the original.  Ortho-    Will consider ORIF L distal radius/ulna fx if/when clinical condition improves.    Lupita NOVAK. Boyd, MD    Electronically signed by Boyd Lupita NOVAK, MD at 05/05/2009 12:22 PM EDT

## 2009-05-05 NOTE — Progress Notes (Signed)
Versed 2mg  IV for agitation, propofol at .  Will monitor. Systolic BP 100.

## 2009-05-05 NOTE — Progress Notes (Signed)
[facility] Lab Technologist Preliminary Report:  Carotid Duplex Scan    Right:  Mild plaque noted in the right carotid system.  Right ICA velocities suggest 16-49% diameter reduction.  Right vertebral artery flow is unable to be visualized.    Left:  Mild plaque noted in the left carotid system.  Left ICA velocities suggest 16-49% diameter reduction.  Left vertebral artery flow is unable to be visualized.    Final report to follow.

## 2009-05-05 NOTE — Progress Notes (Signed)
Neurology Progress Note    Patient ID:  Breanna Lester  [id number]  48 y.o.  [birth year]    Subjective:      Patient none..    Current facility-administered medications   Medication Dose Route Frequency   ??? propofol (DIPRIVAN) 10 mg/mL injection        ??? sodium chloride 0.9 % bolus infusion 500 mL  500 mL IntraVENous ONCE   ??? lactulose (CHRONULAC) solution 20 g  20 g Oral BID   ??? aspirin (ASPIRIN) tablet 325 mg  325 mg Oral DAILY   ??? potassium chloride 20 mEq in 50 ml IVPB   20 mEq IntraVENous Q1H   ??? dextrose 5% with KCl 20 mEq/L infusion    IntraVENous CONTINUOUS   ??? albumin human 5% (BUMINATE) solution 25 g  25 g IntraVENous Q4H PRN   ??? vancomycin (VANCOCIN) 1 g in 0.9% sodium chloride 250 mL IVPB  1,000 mg IntraVENous Q24H   ??? metoprolol tartrate (LOPRESSOR) injection 2.5 mg  2.5 mg IntraVENous Q6H   ??? DISCONTD: dextrose 5% - 0.45% NaCl with KCl 20 mEq/L infusion    IntraVENous CONTINUOUS   ??? propofol (DIPRIVAN) infusion  5-50 mcg/kg/min IntraVENous TITRATE   ??? morphine injection 1 mg  1 mg IntraVENous PRN   ??? pantoprazole (PROTONIX) injection 40 mg  40 mg IntraVENous DAILY   ??? levofloxacin (LEVAQUIN) 750 mg infusion   750 mg IntraVENous Q48H   ??? piperacillin-tazobactam (ZOSYN) 2.25 g in 0.9% sodium chloride (MBP/ADV) 50 mL MBP  2.25 g IntraVENous Q8H   ??? albuterol/ipratropium (DUONEB) neb solution   1 Dose Nebulization Q4HWA RT   ??? midazolam (VERSED) injection 2-4 mg  2-4 mg IntraVENous Q1H PRN   ??? enoxaparin (LOVENOX) injection 60 mg  60 mg SubCUTAneous Q12H   ??? lorazepam (ATIVAN) injection 1-2 mg  1-2 mg IntraVENous Q6H PRN   ??? sodium chloride (NS) flush 10 mL  10 mL InterCATHeter Q8H   ??? sodium chloride (NS) flush 10 mL  10 mL InterCATHeter PRN   ??? heparin (porcine) pf 300 Units  300 Units InterCATHeter PRN   ??? sodium chloride (NS) flush 20 mL  20 mL InterCATHeter PRN   ??? propofol (DIPRIVAN) 10 mg/mL injection        ??? DISCONTD: aspirin (ASA) suppository 300 mg  300 mg Rectal DAILY    ??? DISCONTD: 0.45% sodium chloride infusion    IntraVENous CONTINUOUS   ??? DISCONTD: vancomycin (VANCOCIN) 1.25 g in 0.9% sodium chloride 250 mL IVPB  1,250 mg IntraVENous Q24H   ??? DISCONTD: metoprolol tartrate (LOPRESSOR) injection 5 mg  5 mg IntraVENous Q6H   ??? acetaminophen (TYLENOL) suppository 650 mg  650 mg Rectal Q6H PRN   ??? albuterol (PROVENTIL VENTOLIN) nebulizer solution 2.5 mg  2.5 mg Nebulization Q2H PRN   ??? thiamine (B-1) 100 mg in 0.9% sodium chloride 50 mL IVPB  100 mg IntraVENous DAILY   ??? nitroglycerin (NITROBID) 2 % ointment 2 Inch  2 Inch Topical Q6H PRN   ??? DISCONTD: lactulose (CHRONULAC) solution 200 g  300 mL Rectal BID   ??? saline peripheral flush 5 mL  5 mL InterCATHeter PRN   ??? DISCONTD: SALINE PERIPHERAL FLUSH Q8H 5 mL  5 mL InterCATHeter Q8H          Review of Systems:    Review of systems not obtained due to patient factors.    Objective:     Patient Vitals in the past 8 hrs:  BP Temp Pulse Resp SpO2 Height Weight   05/05/09 1600 97/44 mmHg 99.5 ??F (37.5 ??C) 91  25  99 % - -   05/05/09 1549 - - 90  - 98 % - -   05/05/09 1500 89/39 mmHg - 89  26  99 % - -   05/05/09 1400 95/44 mmHg - 84  22  100 % - -   05/05/09 1330 90/42 mmHg - 84  23  99 % - -   05/05/09 1322 90/43 mmHg - 83  23  100 % - -   05/05/09 1315 86/41 mmHg - 82  23  99 % - -   05/05/09 1310 85/43 mmHg - 81  22  99 % - -   05/05/09 1300 85/41 mmHg 97.8 ??F (36.6 ??C) 81  21  99 % - -   05/05/09 1245 90/42 mmHg - 79  21  99 % - -   05/05/09 1230 99/45 mmHg - 81  25  100 % - -   05/05/09 1215 96/52 mmHg - 83  17  99 % - -   05/05/09 1200 87/37 mmHg 98 ??F (36.7 ??C) 80  22  99 % - -   05/05/09 1145 90/40 mmHg - 81  22  99 % - -   05/05/09 1130 89/40 mmHg - 79  22  98 % - -   05/05/09 1123 - - 79  - 98 % - -   05/05/09 1115 92/42 mmHg - 80  23  98 % - -   05/05/09 1100 86/43 mmHg - 79  23  98 % - -   05/05/09 1045 88/40 mmHg - 78  25  98 % - -   05/05/09 1030 89/40 mmHg - 80  23  97 % - -   05/05/09 1021 85/41 mmHg - 79  23  97 % - -    05/05/09 1015 83/40 mmHg - 81  21  97 % - -   05/05/09 1007 85/40 mmHg - 81  22  98 % - -   05/05/09 1000 82/39 mmHg - 82  22  98 % - -   05/05/09 0838 - - 98  - 99 % - -   05/05/09 1610 - - - - - 5\' 2"  (1.575 m) 144 lb 13.5 oz (65.7 kg)         In: 1331.2 (1131.2 I.V.)  Out: 211 (211 Urine)    In: 4639 (4489 I.V.)  Out: 3633 (3133 Urine)      Lab Review Recent Results (from the past 24 hour(s))   CK W/ CKMB & INDEX    Collection Time    05/04/09  4:50 PM   Component Value Range   ??? CK 2222 (*) 21 - 215 (U/L)   ??? CK - MB 7.4 (*) 0.5 - 3.6 (NG/ML)   ??? CK-MB Index 0.3  0 - 2.5 ( )   TROPONIN I    Collection Time    05/04/09  4:50 PM   Component Value Range   ??? Troponin-I, Qt. 19.40 (*) <0.05 (ng/mL)   BLOOD GAS, ARTERIAL    Collection Time    05/05/09  4:09 AM   Component Value Range   ??? pH 7.46 (*) 7.35 - 7.45 ( )   ??? PCO2 31 (*) 35 - 45 (mmHg)   ??? PO2 72 (*) 80 - 100 (mmHg)   ??? O2 SAT 95  92 - 97 (%)   ???  BICARBONATE 21 (*) 22 - 26 (mmol/L)   ??? BASE DEFICIT 1.3 (*) BE NORMAL RANGE -3 T (mmol/L)   ??? FIO2 50  (%)   ??? SITE RR     ??? MODE A/C     ??? SET RATE 10  ( )   ??? VT/PIP 500  ( )   ??? PEEP/CPAP 5  ( )   ??? SAMPLE SOURCE ARTERIAL     ??? O2 METHOD VENTILATOR     CBC WITH AUTOMATED DIFF    Collection Time    05/05/09  5:30 AM   Component Value Range   ??? WBC 12.0 (*) 3.6 - 11.0 (K/uL)   ??? RBC 3.74 (*) 3.80 - 5.20 (M/uL)   ??? HGB 10.0 (*) 11.5 - 16.0 (g/dL)   ??? HCT 32.5 (*) 35.0 - 47.0 (%)   ??? MCV 86.9  80.0 - 99.0 (FL)   ??? MCH 26.7  26.0 - 34.0 (PG)   ??? MCHC 30.8  30.0 - 36.5 (g/dL)   ??? RDW 19.4 (*) 11.5 - 14.5 (%)   ??? PLATELET 94 (*) 150 - 400 (K/uL)   ??? NEUTROPHILS 82 (*) 32 - 75 (%)   ??? LYMPHOCYTES 12  12 - 49 (%)   ??? MONOCYTES 6  5 - 13 (%)   ??? EOSINOPHILS 0  0 - 7 (%)   ??? BASOPHILS 0  0 - 1 (%)   ??? ABSOLUTE NEUTS 9.7 (*) 1.8 - 8.0 (K/UL)   ??? ABSOLUTE LYMPHS 1.5  0.8 - 3.5 (K/UL)   ??? ABSOLUTE MONOS 0.8  0.0 - 1.0 (K/UL)   ??? ABSOLUTE EOSINS 0.1  0.0 - 0.4 (K/UL)   ??? ABSOLUTE BASOS 0.0  0.0 - 0.1 (K/UL)    METABOLIC PANEL, COMPREHENSIVE    Collection Time    05/05/09  5:30 AM   Component Value Range   ??? Sodium 159 (*) 136 - 145 (MMOL/L)   ??? Potassium 3.1 (*) 3.5 - 5.1 (MMOL/L)   ??? Chloride 125 (*) 97 - 108 (MMOL/L)   ??? CO2 24  21 - 32 (MMOL/L)   ??? Anion gap 10  5 - 15 (mmol/L)   ??? Glucose 165 (*) 65 - 100 (MG/DL)   ??? BUN 44 (*) 6 - 20 (MG/DL)   ??? Creatinine 1.8 (*) 0.6 - 1.3 (MG/DL)   ??? BUN/Creatinine ratio 24 (*) 12 - 20 ( )   ??? GFR est AA 39 (*) >60 (ml/min/1.59m2)   ??? GFR est non-AA 32 (*) >60 (ml/min/1.76m2)   ??? Calcium 8.2 (*) 8.5 - 10.1 (MG/DL)   ??? Bilirubin, total 4.5 (*) 0.2 - 1.0 (MG/DL)   ??? ALT 108 (*) 12 - 78 (U/L)   ??? AST 170 (*) 15 - 37 (U/L)   ??? Alk. phosphatase 144 (*) 50 - 136 (U/L)   ??? Protein, total 6.0 (*) 6.4 - 8.2 (g/dL)   ??? Albumin 2.4 (*) 3.5 - 5.0 (g/dL)   ??? Globulin 3.6  2.0 - 4.0 (g/dL)   ??? A-G Ratio 0.7 (*) 1.1 - 2.2 ( )   PHOSPHORUS    Collection Time    05/05/09  5:30 AM   Component Value Range   ??? Phosphorus 3.0  2.5 - 4.9 (MG/DL)   MAGNESIUM    Collection Time    05/05/09  5:30 AM   Component Value Range   ??? Magnesium 2.6 (*) 1.6 - 2.4 (MG/DL)   TSH, 3RD GENERATION    Collection  Time    05/05/09  5:30 AM   Component Value Range   ??? TSH, 3rd generation <0.01 (*) 0.36 - 3.74 (UIU/ML)   T3, FREE    Collection Time    05/05/09  5:30 AM   Component Value Range   ??? Free Triiodothyronine 3.3  2.2 - 4.0 (pg/mL)   T4    Collection Time    05/05/09  5:30 AM   Component Value Range   ??? T4 11.8  4.8 - 13.9 (ug/dL)   CK    Collection Time    05/05/09  5:30 AM   Component Value Range   ??? CK 1388 (*) 21 - 215 (U/L)   AMMONIA    Collection Time    05/05/09  5:30 AM   Component Value Range   ??? Ammonia 45 (*) <32 (UMOL/L)   VITAMIN B12 & FOLATE    Collection Time    05/05/09  5:30 AM   Component Value Range   ??? Vitamin B12 1745 (*) 254 - 1320 (pg/mL)   ??? Folate 9.4  5.0 - 21.0 (ng/mL)   SED RATE (ESR)    Collection Time    05/05/09  5:30 AM   Component Value Range   ??? Sed rate (ESR) 15  0 - 20 (MM/HR)    COAGULATION SCREEN    Collection Time    05/05/09  5:30 AM   Component Value Range   ??? INR 3.0 (*) 0.9 - 1.1 ( )   ??? Prothrombin Time-PT 28.9 (*) 9.0 - 11.0 (SECS)   ??? aPTT 32.6  24.0 - 33.0 (sec)   ??? Fibrinogen 244  200 - 475 (mg/dL)   TROPONIN I    Collection Time    05/05/09  5:30 AM   Component Value Range   ??? Troponin-I, Qt. 10.16 (*) <0.05 (ng/mL)   EKG, 12 LEAD, INITIAL    Collection Time    05/05/09  6:32 AM   Component Value Range   ??? Ventricular Rate 89  (BPM)   ??? Atrial Rate 89  (BPM)   ??? P-R Interval 122  (ms)   ??? QRS Duration 84  (ms)   ??? Q-T Interval 440  (ms)   ??? QTC Calculation (Bezet) 535  (ms)   ??? Calculated P Axis 42  (degrees)   ??? Calculated R Axis 32  (degrees)   ??? Calculated T Axis 173  (degrees)   ??? Diagnosis        Value: Normal sinus rhythm      T wave abnormality, consider anterolateral ischemia      Prolonged QT      When compared with ECG of 04-May-2009 09:57,      MANUAL COMPARISON REQUIRED, DATA IS UNCONFIRMED      Confirmed by Deanna Artis ([id number]) on 05/05/2009 9:42:15 AM         Additional comments:I reviewed the patient's new clinical lab test results. all      NEUROLOGICAL EXAM:    Appearance:  The patient is well developed, well nourished, intubated and sedated   Mental Status: Intubated and sedated.   Cranial Nerves:   Non testable visual fields. Fundi are benign. PERLA, EOM's full, no nystagmus, no ptosis. Facial sensation is normal. Corneal reflexes are intact. Facial movement is symmetric.   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 No fasciculations.   Reflexes:   Deep tendon reflexes 1+/4 and symmetrical.   Sensory:   No response to pain.  Gait:  Not testable.   Tremor:   No tremor noted.   Cerebellar:  No cerebellar signs present.   Neurovascular:  Normal heart sounds and regular rhythm, peripheral pulses intact, and no carotid bruits.         Assessment:     Patient Active Hospital Problem List:  Nausea with vomiting (05/01/2009)    Hypokalemia (05/01/2009)     Hepatic encephalopathy (05/01/2009)    Gait abnormality (05/01/2009)    Alcoholic cirrhosis of liver (05/01/2009)    Hypomagnesemia (05/01/2009)      Plan:   Encephalopathy and intubated  Will follow      Signed:  Maisie Fus A. Katrinka Blazing, MD  05/05/2009  4:08 PM

## 2009-05-05 NOTE — Progress Notes (Signed)
Ortho-    Will consider ORIF L distal radius/ulna fx if/when clinical condition improves.    Karie Georges. Janann August, MD

## 2009-05-05 NOTE — Progress Notes (Signed)
Cooling blanket back on, temp 99.9 rectally. EEG in progress.  Patient appears comfortable/relaxed.

## 2009-05-05 NOTE — Progress Notes (Addendum)
Hospitalist Progress Note         NAME: Breanna Lester        DOB:  [birth year]        MRN:  [id number]      Assessment & Plan   Sepsis syndrome :likely underlying pneumonia, fever, leukocytosis  ????????????????????????Broad spectrum antibiotics.  ????????????????????????No evidence of UTI or ascites(SBP), has an umbilical hernia that is not     reducible but overall adbomen is soft.    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       Subjective:     48yo caucasian female    Chief Complaint:  In ICU intubated with acute respiratory failure     Discussed with RN events overnight.     Review of Systems:  Fever/chills y   Cough N   Sputum    N   SOB/DOE  N   Chest Pain N   Abdominal Pain N   Diarrhea N   Constipation N   Nausea/Vomiting N   Dysuria N   Tolerating PT N   Tolerating Diet N   Could not obtain due to AMS vs intubation xx        Objective:   Patient intubated this AM with acute respiratory failure, febrile, tachycardic, mild hypotension    VITALS:   Last 24hrs VS reviewed since prior progress note. Most recent are:  Visit Vitals   Item Reading   ??? BP 90/39   ??? Pulse 85   ??? Temp 100.4 ??F (38 ??C)   ??? Resp 27   ??? Ht 5\' 2"  (1.575 m)   ??? Wt 144 lb 13.5 oz (65.7 kg)   ??? SpO2 98%    ??? PF 65L/min           Intake/Output Summary (Last 24 hours) at 05/05/09 0737  Last data filed at 05/05/09 0700   Gross per 24 hour   Intake 3519.29 ml   Output   1533 ml   Net 1986.29 ml        Telemetry Reviewed:     PHYSICAL EXAM:  General: Intubated, sedated????  HEENT: Pupils unequal, R 3mm, L 2mm  Lungs:  Bilateral rales, ronchi and mild wheezing.  Heart:  Regular rhythm, tachycardic, SEM  Abdomen: Soft, Non distended, hypoactive BS, no rebound, umbilical hernia non reducible  Extremities: No edema, pulses present, brace in left arm  Neurologic:?? GCS [id number], sedated  Psych:???? sedated    Lab Data Reviewed: (see below)    Medications Reviewed: (see below)    PMH/SH reviewed - no change compared to H&P  ______________________________________________________________________  Total time spent with patient: 45 minutes     Critical Care Provided     Minutes non procedure based  Care Plan discussed with:  Patient    Family    RN x   Care Manager    Consultant/Specialist      >50% of visit spent in counseling and coordination of care       Prophylaxis:  GI PPI   DVT lovenox     Disposition:   Home with Family    HH/PT/OT/RN    SNF/LTC    [facility]      ______________________________________________________________________  Attending Physician: Hazeline Junker, MD   _____________________________________________________________________________________________________  Procedures: see electronic medical records for all procedures/Xrays and details  which were not copied into this note but were reviewed prior to creation of Plan.      LABS:  Recent Labs   Basename 05/05/09 0530 05/04/09 0325   ??? WBC 12.0* 14.6*   ??? HGB 10.0* 9.7*   ??? HCT 32.5* 32.2*   ??? PLT 94* 118*         Recent Labs   Basename 05/05/09 0530 05/04/09 0325 05/03/09 1533 05/03/09 0255   ??? NA 159* 148* 162* --   ??? K 3.1* 3.5 4.7 --   ??? CL 125* 116* 128* --   ??? CO2 24 20* 22 --   ??? BUN 44* 26* 24* --   ??? CREA 1.8* 1.5* 1.3 --   ??? GLU 165* 483* 108* --    ??? CA 8.2* 8.3* 9.7 --   ??? MG 2.6* 2.2 -- 2.2   ??? PHOS 3.0 -- -- --   ??? URICA -- -- -- --         Recent Labs   Basename 05/05/09 0530 05/03/09 0255   ??? SGOT 170* 159*   ??? GPT 108* 61   ??? AP 144* 197*   ??? TBIL 4.5* 4.8*   ??? TP 6.0* 6.8   ??? ALB 2.4* 3.3*   ??? GLOB 3.6 3.5   ??? GGT -- --   ??? AML -- --   ??? LPSE -- 59*         Recent Labs   Basename 05/05/09 0530   ??? INR 3.0*   ??? PTP 28.9*   ??? APTT --          No results found for this basename: FE:2,TIBC:2,PSAT:2,FERR:2, in the last 72 hours   Recent Labs   Basename 05/05/09 0409 05/04/09 0815   ??? PH 7.46* 7.47*   ??? PCO2 31* 31*   ??? PO2 72* 507*         Recent Labs   Basename 05/05/09 0530 05/04/09 1650 05/04/09 0325   ??? CPK 1388* 2222* 4683*   ??? CKMB -- -- --         Lab Results   Component Value Date/Time    POC GLUCOSE 114 05/01/2009  4:40 PM    POC GLUCOSE 162 05/01/2009 12:02 PM    POC GLUCOSE 115 05/01/2009  8:39 AM         Lab Results   Component Value Date/Time    Color YELLOW 04/30/2009 12:14 AM    Appearance CLEAR 04/30/2009 12:14 AM    Specific gravity 1.010 10/29/2008 11:00 AM    Specific gravity 1.011 04/30/2009 12:14 AM    pH 6.0 04/30/2009 12:14 AM    Protein NEGATIVE  04/30/2009 12:14 AM    Glucose NEGATIVE  04/30/2009 12:14 AM    Ketone NEGATIVE  04/30/2009 12:14 AM    Bilirubin NEGATIVE  04/30/2009 12:14 AM    Urobilinogen 2.0 04/30/2009  12:14 AM    Nitrites NEGATIVE  04/30/2009 12:14 AM    Leukocyte Esterase TRACE 04/30/2009 12:14 AM    Epithelial cells 10-20 04/30/2009 12:14 AM    Bacteria NEGATIVE  04/30/2009 12:14 AM    WBC 0-4 04/30/2009 12:14 AM    RBC 0-3 04/30/2009 12:14 AM           MEDICATIONS:  Current facility-administered medications   Medication Dose Route Frequency   ??? propofol (DIPRIVAN) 10 mg/mL injection        ??? dextrose 5% - 0.45% NaCl with KCl 20 mEq/L infusion    IntraVENous CONTINUOUS   ??? sodium chloride 0.9 % bolus infusion 500 mL  500 mL IntraVENous ONCE   ??? lactulose (CHRONULAC) solution 20 g  20 g Oral BID    ??? aspirin (ASA) suppository 300 mg  300 mg Rectal DAILY   ??? propofol (DIPRIVAN) infusion  5-50 mcg/kg/min IntraVENous TITRATE   ??? morphine injection 1 mg  1 mg IntraVENous PRN   ??? pantoprazole (PROTONIX) injection 40 mg  40 mg IntraVENous DAILY   ??? levofloxacin (LEVAQUIN) 750 mg infusion   750 mg IntraVENous Q48H   ??? piperacillin-tazobactam (ZOSYN) 2.25 g in 0.9% sodium chloride (MBP/ADV) 50 mL MBP  2.25 g IntraVENous Q8H   ??? albuterol/ipratropium (DUONEB) neb solution   1 Dose Nebulization Q4HWA RT   ??? vancomycin (VANCOCIN) 1.25 g in 0.9% sodium chloride 250 mL IVPB  1,250 mg IntraVENous Q24H   ??? midazolam (VERSED) injection 2-4 mg  2-4 mg IntraVENous Q1H PRN   ??? metoprolol tartrate (LOPRESSOR) injection 5 mg  5 mg IntraVENous Q6H   ??? enoxaparin (LOVENOX) injection 60 mg  60 mg SubCUTAneous Q12H   ??? lorazepam (ATIVAN) injection 1-2 mg  1-2 mg IntraVENous Q6H PRN   ??? sodium chloride (NS) flush 10 mL  10 mL InterCATHeter Q8H   ??? sodium chloride (NS) flush 10 mL  10 mL InterCATHeter PRN   ??? heparin (porcine) pf 300 Units  300 Units InterCATHeter PRN   ??? sodium chloride (NS) flush 20 mL  20 mL InterCATHeter PRN   ??? propofol (DIPRIVAN) 10 mg/mL injection        ??? DISCONTD: 0.45% sodium chloride infusion    IntraVENous CONTINUOUS   ??? DISCONTD: midazolam (VERSED) injection 2 mg  2 mg IntraVENous Q2H PRN   ??? DISCONTD: midazolam (VERSED) 1 mg/mL injection        ??? DISCONTD: 0.45% sodium chloride infusion    IntraVENous CONTINUOUS   ??? DISCONTD: enoxaparin (LOVENOX) injection 40 mg  40 mg SubCUTAneous DAILY   ??? acetaminophen (TYLENOL) suppository 650 mg  650 mg Rectal Q6H PRN   ??? albuterol (PROVENTIL VENTOLIN) nebulizer solution 2.5 mg  2.5 mg Nebulization Q2H PRN   ??? thiamine (B-1) 100 mg in 0.9% sodium chloride 50 mL IVPB  100 mg IntraVENous DAILY   ??? nitroglycerin (NITROBID) 2 % ointment 2 Inch  2 Inch Topical Q6H PRN    ??? DISCONTD: piperacillin-tazobactam (ZOSYN) 3.375 g in 0.9% sodium chloride (MBP/ADV) 100 mL MBP  3.375 g IntraVENous Q6H   ??? DISCONTD: lactulose (CHRONULAC) solution 200 g  300 mL Rectal BID   ??? DISCONTD: albuterol (PROVENTIL VENTOLIN) nebulizer solution 2.5 mg  2.5 mg Nebulization QID   ??? DISCONTD: ipratropium (ATROVENT) 0.02 % nebulizer solution 0.5 mg  0.5 mg Nebulization QID   ??? DISCONTD: levofloxacin (LEVAQUIN) 750 mg infusion   750 mg IntraVENous Q24H   ??? DISCONTD: enoxaparin (LOVENOX) injection  40 mg  40 mg SubCUTAneous Q24H   ??? SALINE PERIPHERAL FLUSH Q8H 5 mL  5 mL InterCATHeter Q8H   ??? saline peripheral flush 5 mL  5 mL InterCATHeter PRN

## 2009-05-05 NOTE — Progress Notes (Signed)
Dr. Juanetta Gosling on floor, informed of patient's BP ranging 87-90 systolic. Sedation on standby, post albumin infusion.  Advised to monitor, if BP cont'd 87 systolic >1 hour, call for orders. 90/48 at present.

## 2009-05-05 NOTE — Consults (Signed)
Nutrition Consult Recommendations:  ?? TF via OGT: "Standard, High Fiber (Jevity 1.5)" start at 48mL/hr continuously  ?? Advance 10 mL/hr after 12 hours if tolerated to goal  ?? Goal: "Standard, High Fiber (Jevity 1.5)" at 25 mL/hr continuously  ?? Flushes per MD 2' hypernatremia  ?? HOB >30 degrees  ?? Strict aspiration precautions  ?? Goal provides: 900 kcal, 38g Pro, 456 mL H2O (1256 mL H2O with current flushes; 1725 kcal, 38g Pro with current rate of D5 and propofol in addition to TF)    Progress Note:  Pt now intubated, sedated in ICU.  Currently with kcals provided from propofol (current rate 15.8 ml/hr) and D5 IVF started this AM (at 130ml/hr) providing: 825 kcal, 0g Pro.  Pt on diet when first admitted, but has been NPO/Clears since 9/12 although no intake noted when pt on diet.    Pt with +BMs from lactulose, noted NH3 to be trending down.  K+ depleted this AM, likely related to lactulose; Mag slightly elevated this AM, Phos WDL.  Pt with hypernatremia--MD started free H2O flushes via OGT tube ( Q6hr).  Currently OGT to [facility], spoke with nsg, tube will be clamped from suction as with other meds.    Goal if D5 and/or propofol decreased: Jevity 1.5 at 40 ml/hr (Goal provides: 1440 kcal, 61g Pro, H2O (1530 mL with current H2O flushes)    Pt's BMI:  Estimated Body mass index is 26.49 kg/(m^2) as calculated from the following:    Height as of this encounter: 5\' 2" (1.575 m).    Weight as of this encounter: 144 lb 13.5 oz(65.7 kg).  Based on the above BMI, pt is overweight.    Thank you for consult, Dr. Angelene Giovanni, RD  Pager [phone]

## 2009-05-05 NOTE — Progress Notes (Signed)
Cardiology Progress Note      05/05/2009 1:44 PM    Admit Date: 04/29/2009        Subjective:     Remains critically ill, intubated, sedated on vent.  BP has dropped (metoprolol held).  Cardiac markers c/w NSTEMI, have "peaked" and now going down.    BP 85/43   Pulse 81   Temp 97.8 ??F (36.6 ??C)   Resp 22   Ht 5\' 2"  (1.575 m)   Wt 144 lb 13.5 oz (65.7 kg)   SpO2 99%   PF 65 L/min  Current facility-administered medications   Medication Dose Route Frequency   ??? propofol (DIPRIVAN) 10 mg/mL injection        ??? sodium chloride 0.9 % bolus infusion 500 mL  500 mL IntraVENous ONCE   ??? lactulose (CHRONULAC) solution 20 g  20 g Oral BID   ??? aspirin (ASPIRIN) tablet 325 mg  325 mg Oral DAILY   ??? potassium chloride 20 mEq in 50 ml IVPB   20 mEq IntraVENous Q1H   ??? dextrose 5% with KCl 20 mEq/L infusion    IntraVENous CONTINUOUS   ??? albumin human 5% (BUMINATE) solution 25 g  25 g IntraVENous Q4H PRN   ??? vancomycin (VANCOCIN) 1 g in 0.9% sodium chloride 250 mL IVPB  1,000 mg IntraVENous Q24H   ??? metoprolol tartrate (LOPRESSOR) injection 2.5 mg  2.5 mg IntraVENous Q6H   ??? DISCONTD: dextrose 5% - 0.45% NaCl with KCl 20 mEq/L infusion    IntraVENous CONTINUOUS   ??? propofol (DIPRIVAN) infusion  5-50 mcg/kg/min IntraVENous TITRATE   ??? morphine injection 1 mg  1 mg IntraVENous PRN   ??? pantoprazole (PROTONIX) injection 40 mg  40 mg IntraVENous DAILY   ??? levofloxacin (LEVAQUIN) 750 mg infusion   750 mg IntraVENous Q48H   ??? piperacillin-tazobactam (ZOSYN) 2.25 g in 0.9% sodium chloride (MBP/ADV) 50 mL MBP  2.25 g IntraVENous Q8H   ??? albuterol/ipratropium (DUONEB) neb solution   1 Dose Nebulization Q4HWA RT   ??? midazolam (VERSED) injection 2-4 mg  2-4 mg IntraVENous Q1H PRN   ??? enoxaparin (LOVENOX) injection 60 mg  60 mg SubCUTAneous Q12H   ??? lorazepam (ATIVAN) injection 1-2 mg  1-2 mg IntraVENous Q6H PRN   ??? sodium chloride (NS) flush 10 mL  10 mL InterCATHeter Q8H   ??? sodium chloride (NS) flush 10 mL  10 mL InterCATHeter PRN    ??? heparin (porcine) pf 300 Units  300 Units InterCATHeter PRN   ??? sodium chloride (NS) flush 20 mL  20 mL InterCATHeter PRN   ??? propofol (DIPRIVAN) 10 mg/mL injection        ??? DISCONTD: aspirin (ASA) suppository 300 mg  300 mg Rectal DAILY   ??? DISCONTD: 0.45% sodium chloride infusion    IntraVENous CONTINUOUS   ??? DISCONTD: vancomycin (VANCOCIN) 1.25 g in 0.9% sodium chloride 250 mL IVPB  1,250 mg IntraVENous Q24H   ??? DISCONTD: metoprolol tartrate (LOPRESSOR) injection 5 mg  5 mg IntraVENous Q6H   ??? acetaminophen (TYLENOL) suppository 650 mg  650 mg Rectal Q6H PRN   ??? albuterol (PROVENTIL VENTOLIN) nebulizer solution 2.5 mg  2.5 mg Nebulization Q2H PRN   ??? thiamine (B-1) 100 mg in 0.9% sodium chloride 50 mL IVPB  100 mg IntraVENous DAILY   ??? nitroglycerin (NITROBID) 2 % ointment 2 Inch  2 Inch Topical Q6H PRN   ??? DISCONTD: lactulose (CHRONULAC) solution 200 g  300 mL Rectal BID   ???  saline peripheral flush 5 mL  5 mL InterCATHeter PRN   ??? DISCONTD: SALINE PERIPHERAL FLUSH Q8H 5 mL  5 mL InterCATHeter Q8H           Objective:      Physical Exam:  Comatose, on vent    Chest--clear  CV--rrr nl S1S2 II/VI sem  Abd--nl BS, non-tender  Ext--no change  Neuro--comatose, unresponsive to deep pain    Data Review:   Labs:  Recent Results (from the past 24 hour(s))   CK W/ CKMB & INDEX    Collection Time    05/04/09  4:50 PM   Component Value Range   ??? CK 2222 (*) 21 - 215 (U/L)   ??? CK - MB 7.4 (*) 0.5 - 3.6 (NG/ML)   ??? CK-MB Index 0.3  0 - 2.5 ( )   TROPONIN I    Collection Time    05/04/09  4:50 PM   Component Value Range   ??? Troponin-I, Qt. 19.40 (*) <0.05 (ng/mL)   BLOOD GAS, ARTERIAL    Collection Time    05/05/09  4:09 AM   Component Value Range   ??? pH 7.46 (*) 7.35 - 7.45 ( )   ??? PCO2 31 (*) 35 - 45 (mmHg)   ??? PO2 72 (*) 80 - 100 (mmHg)   ??? O2 SAT 95  92 - 97 (%)   ??? BICARBONATE 21 (*) 22 - 26 (mmol/L)   ??? BASE DEFICIT 1.3 (*) BE NORMAL RANGE -3 T (mmol/L)   ??? FIO2 50  (%)   ??? SITE RR     ??? MODE A/C     ??? SET RATE 10  ( )    ??? VT/PIP 500  ( )   ??? PEEP/CPAP 5  ( )   ??? SAMPLE SOURCE ARTERIAL     ??? O2 METHOD VENTILATOR     CBC WITH AUTOMATED DIFF    Collection Time    05/05/09  5:30 AM   Component Value Range   ??? WBC 12.0 (*) 3.6 - 11.0 (K/uL)   ??? RBC 3.74 (*) 3.80 - 5.20 (M/uL)   ??? HGB 10.0 (*) 11.5 - 16.0 (g/dL)   ??? HCT 32.5 (*) 35.0 - 47.0 (%)   ??? MCV 86.9  80.0 - 99.0 (FL)   ??? MCH 26.7  26.0 - 34.0 (PG)   ??? MCHC 30.8  30.0 - 36.5 (g/dL)   ??? RDW 19.4 (*) 11.5 - 14.5 (%)   ??? PLATELET 94 (*) 150 - 400 (K/uL)   ??? NEUTROPHILS 82 (*) 32 - 75 (%)   ??? LYMPHOCYTES 12  12 - 49 (%)   ??? MONOCYTES 6  5 - 13 (%)   ??? EOSINOPHILS 0  0 - 7 (%)   ??? BASOPHILS 0  0 - 1 (%)   ??? ABSOLUTE NEUTS 9.7 (*) 1.8 - 8.0 (K/UL)   ??? ABSOLUTE LYMPHS 1.5  0.8 - 3.5 (K/UL)   ??? ABSOLUTE MONOS 0.8  0.0 - 1.0 (K/UL)   ??? ABSOLUTE EOSINS 0.1  0.0 - 0.4 (K/UL)   ??? ABSOLUTE BASOS 0.0  0.0 - 0.1 (K/UL)   METABOLIC PANEL, COMPREHENSIVE    Collection Time    05/05/09  5:30 AM   Component Value Range   ??? Sodium 159 (*) 136 - 145 (MMOL/L)   ??? Potassium 3.1 (*) 3.5 - 5.1 (MMOL/L)   ??? Chloride 125 (*) 97 - 108 (MMOL/L)   ??? CO2 24  21 - 32 (MMOL/L)   ??? Anion gap 10  5 - 15 (mmol/L)   ??? Glucose 165 (*) 65 - 100 (MG/DL)   ??? BUN 44 (*) 6 - 20 (MG/DL)   ??? Creatinine 1.8 (*) 0.6 - 1.3 (MG/DL)   ??? BUN/Creatinine ratio 24 (*) 12 - 20 ( )   ??? GFR est AA 39 (*) >60 (ml/min/1.58m2)   ??? GFR est non-AA 32 (*) >60 (ml/min/1.44m2)   ??? Calcium 8.2 (*) 8.5 - 10.1 (MG/DL)   ??? Bilirubin, total 4.5 (*) 0.2 - 1.0 (MG/DL)   ??? ALT 108 (*) 12 - 78 (U/L)   ??? AST 170 (*) 15 - 37 (U/L)   ??? Alk. phosphatase 144 (*) 50 - 136 (U/L)   ??? Protein, total 6.0 (*) 6.4 - 8.2 (g/dL)   ??? Albumin 2.4 (*) 3.5 - 5.0 (g/dL)   ??? Globulin 3.6  2.0 - 4.0 (g/dL)   ??? A-G Ratio 0.7 (*) 1.1 - 2.2 ( )   PHOSPHORUS    Collection Time    05/05/09  5:30 AM   Component Value Range   ??? Phosphorus 3.0  2.5 - 4.9 (MG/DL)   MAGNESIUM    Collection Time    05/05/09  5:30 AM   Component Value Range   ??? Magnesium 2.6 (*) 1.6 - 2.4 (MG/DL)    TSH, 3RD GENERATION    Collection Time    05/05/09  5:30 AM   Component Value Range   ??? TSH, 3rd generation <0.01 (*) 0.36 - 3.74 (UIU/ML)   T3, FREE    Collection Time    05/05/09  5:30 AM   Component Value Range   ??? Free Triiodothyronine 3.3  2.2 - 4.0 (pg/mL)   T4    Collection Time    05/05/09  5:30 AM   Component Value Range   ??? T4 11.8  4.8 - 13.9 (ug/dL)   CK    Collection Time    05/05/09  5:30 AM   Component Value Range   ??? CK 1388 (*) 21 - 215 (U/L)   AMMONIA    Collection Time    05/05/09  5:30 AM   Component Value Range   ??? Ammonia 45 (*) <32 (UMOL/L)   VITAMIN B12 & FOLATE    Collection Time    05/05/09  5:30 AM   Component Value Range   ??? Vitamin B12 1745 (*) 254 - 1320 (pg/mL)   ??? Folate 9.4  5.0 - 21.0 (ng/mL)   SED RATE (ESR)    Collection Time    05/05/09  5:30 AM   Component Value Range   ??? Sed rate (ESR) 15  0 - 20 (MM/HR)   COAGULATION SCREEN    Collection Time    05/05/09  5:30 AM   Component Value Range   ??? INR 3.0 (*) 0.9 - 1.1 ( )   ??? Prothrombin Time-PT 28.9 (*) 9.0 - 11.0 (SECS)   ??? aPTT 32.6  24.0 - 33.0 (sec)   ??? Fibrinogen 244  200 - 475 (mg/dL)   TROPONIN I    Collection Time    05/05/09  5:30 AM   Component Value Range   ??? Troponin-I, Qt. 10.16 (*) <0.05 (ng/mL)   EKG, 12 LEAD, INITIAL    Collection Time    05/05/09  6:32 AM   Component Value Range   ??? Ventricular Rate 89  (BPM)   ??? Atrial Rate 89  (BPM)   ???  P-R Interval 122  (ms)   ??? QRS Duration 84  (ms)   ??? Q-T Interval 440  (ms)   ??? QTC Calculation (Bezet) 535  (ms)   ??? Calculated P Axis 42  (degrees)   ??? Calculated R Axis 32  (degrees)   ??? Calculated T Axis 173  (degrees)   ??? 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      Probable pneumonia  Plan:     Options limited  Lovenox, ASA, IV metoprolol as tolerated  Vent/resp/hemodynamic support  Agree with other plans outlined  Eventual cath if stabilizes/recovers      Ellwood Sayers, MD

## 2009-05-05 NOTE — Progress Notes (Signed)
Pharmacokinetic Dosing Note    S/O: NT is a 67 yoF who is currently being treated with Vancomycin for sepsis syndrome with possible underlying PNA.   Ht 63"  Wt 73.8 kg  9/13 Scr 1.3 BUN 24  9/14 Scr 1.5 BUN 26  9/15 Scr 1.8 BUN 44 Est CrCl 34 ml/min     WBC 12    A/P: Will adjust Vancomycin secondary to substantial increase in Scr (from 1.3 to 1.8).     Vancomycin 1 gm IV every 24 hours, first dose due at 1800 tonight.   Goal 15 - 20   Pharmacy to follow.    Thank you,  Lucky Cowboy, PharmD

## 2009-05-05 NOTE — Progress Notes (Signed)
PULMONARY/Critical Care/SLEEP MEDICINE ??? [facility]   Name: Breanna Lester   DOB: 09/18/1960   MRN: [id number]   Date: 05/05/2009 10:32 AM   [x]     I have reviewed the flowsheet and previous day???s notes.  [x]    The patient is unable to give any meaningful history or review of systems because the patient is:  [x]    Intubated []       [x]    Sedated    []    Unresponsive      [x]    The patient is critically ill on      [x]    Mechanical ventilation []    Pressors   []    BiPAP []                     XBM:WUXLKG of systems not obtained due to patient factors.    Vital Signs:    BP 91/41   Pulse 98   Temp 99.3 ??F (37.4 ??C)   Resp 21   Ht 5\' 2"  (1.575 m)   Wt 144 lb 13.5 oz (65.7 kg)   SpO2 99%   PF 65 L/min    O2 Device: Endotracheal tube   O2 Flow Rate (L/min): 2 l/min   Temp (24hrs), Avg:100.9 ??F (38.3 ??C), Min:99 ??F (37.2 ??C), Max:102.7 ??F (39.3 ??C)       Intake/Output:   Last shift:      In: 633.5 (633.5 I.V.)  Out: 31 (31 Urine)    Last 3 shifts: In: 4639 (4489 I.V.)  Out: 3633 (3133 Urine)      Intake/Output Summary (Last 24 hours) at 05/05/09 1032  Last data filed at 05/05/09 0800   Gross per 24 hour   Intake 3573.3 ml   Output   1479 ml   Net 2094.3 ml       Hemodynamics:   PAP:     Wedge:     CVP:      CO:     CI:     SVR:     PVR:        Ventilator Settings:  Ventilator  Mode: Assist control  Respiratory Rate  Resp Rate Set: 10   Resp Rate Observed: 22   Insp Flow (l/min): 65 l/sec  I:E Ratio: 1:1.5  Ventilator Volumes  Vt Set (ml): 500 ml  Vt Observed (ml): 612 ml  Ve Observed (l/min): 12.1 l/min  Ventilator Pressures  PIP Observed (cm H2O): 32 cm H2O  Plateau Pressure (cm H2O): 25 cm H2O  MAP (cm H2O): 13   PEEP/CPAP (cm H2O): 5 cm H20    Physical Exam:    General: [x]    Intubated/sedated []    No acute distress []        [x]    Ill appearing []     []           HEENT: []    Icteric [x]    PERRL []        [x]    Anicteric Mucosa:[x]    moist   []    dry []           Resp: []    Wheeze []    Clear to ascultation bilaterally []    Accessory muscle use    []    Rales []    Chest tube:  []    Air leak []        [x]    Ronchi []    Crepitus []       [x]    Coarse bilateral ventilator breath sounds  CV: [x]    Regular []    Tachycardia []        []    Irregular []    Bradycardic []        []    Murmur []    S3 []        []    Edema []    S4 []          GI: [x]    Soft  [x]    NG Tube Bowel sounds: []    present [x]    absent    []    Firm []    PEG []        []    Distended  []          GU: [x]    Foley []    Hematuria []        [x]    Clear urine []    Edema []          MSK:    [x]    SCD???s []    Edema []        [x]    No deformities []     []          Skin: [x]    Warm [x]    Dry  []        []    Cool []    Moist []        []    Hot  []    Diaphoretic []        []    Rash  []    Cyanosis []          N-psych: [x]    Sedated  []    Agitated []        []    Alert  Follows commands:   []    Yes  [x]    No []          Devices: [x]    ET-Tube []    Tracheostomy []    Chest tube: Air leak? []    Y/[]    N    []    [employer] []    PA Catheter []        []    PICC []     []          DATA:   Current facility-administered medications   Medication Dose Route Frequency   ??? propofol (DIPRIVAN) 10 mg/mL injection        ??? dextrose 5% - 0.45% NaCl with KCl 20 mEq/L infusion    IntraVENous CONTINUOUS   ??? sodium chloride 0.9 % bolus infusion 500 mL  500 mL IntraVENous ONCE   ??? lactulose (CHRONULAC) solution 20 g  20 g Oral BID   ??? aspirin (ASPIRIN) tablet 325 mg  325 mg Oral DAILY   ??? propofol (DIPRIVAN) infusion  5-50 mcg/kg/min IntraVENous TITRATE   ??? morphine injection 1 mg  1 mg IntraVENous PRN   ??? pantoprazole (PROTONIX) injection 40 mg  40 mg IntraVENous DAILY   ??? levofloxacin (LEVAQUIN) 750 mg infusion   750 mg IntraVENous Q48H   ??? piperacillin-tazobactam (ZOSYN) 2.25 g in 0.9% sodium chloride (MBP/ADV) 50 mL MBP  2.25 g IntraVENous Q8H   ??? albuterol/ipratropium (DUONEB) neb solution   1 Dose Nebulization Q4HWA RT    ??? vancomycin (VANCOCIN) 1.25 g in 0.9% sodium chloride 250 mL IVPB  1,250 mg IntraVENous Q24H   ??? midazolam (VERSED) injection 2-4 mg  2-4 mg IntraVENous Q1H PRN   ??? metoprolol tartrate (LOPRESSOR) injection 5 mg  5 mg IntraVENous Q6H   ??? enoxaparin (LOVENOX) injection 60 mg  60 mg SubCUTAneous Q12H   ??? lorazepam (ATIVAN) injection 1-2 mg  1-2 mg IntraVENous Q6H  PRN   ??? sodium chloride (NS) flush 10 mL  10 mL InterCATHeter Q8H   ??? sodium chloride (NS) flush 10 mL  10 mL InterCATHeter PRN   ??? heparin (porcine) pf 300 Units  300 Units InterCATHeter PRN   ??? sodium chloride (NS) flush 20 mL  20 mL InterCATHeter PRN   ??? propofol (DIPRIVAN) 10 mg/mL injection        ??? DISCONTD: aspirin (ASA) suppository 300 mg  300 mg Rectal DAILY   ??? DISCONTD: midazolam (VERSED) injection 2 mg  2 mg IntraVENous Q2H PRN   ??? DISCONTD: 0.45% sodium chloride infusion    IntraVENous CONTINUOUS   ??? DISCONTD: enoxaparin (LOVENOX) injection 40 mg  40 mg SubCUTAneous DAILY   ??? acetaminophen (TYLENOL) suppository 650 mg  650 mg Rectal Q6H PRN   ??? albuterol (PROVENTIL VENTOLIN) nebulizer solution 2.5 mg  2.5 mg Nebulization Q2H PRN   ??? thiamine (B-1) 100 mg in 0.9% sodium chloride 50 mL IVPB  100 mg IntraVENous DAILY   ??? nitroglycerin (NITROBID) 2 % ointment 2 Inch  2 Inch Topical Q6H PRN   ??? DISCONTD: lactulose (CHRONULAC) solution 200 g  300 mL Rectal BID   ??? SALINE PERIPHERAL FLUSH Q8H 5 mL  5 mL InterCATHeter Q8H   ??? saline peripheral flush 5 mL  5 mL InterCATHeter PRN         Telemetry: [x]    Sinus []    A-flutter []    Paced    []    A-fib []    Multiple PVC???s                  Labs:  Recent Labs   Basename 05/05/09 0530 05/04/09 0325 05/03/09 0255   ??? WBC 12.0* 14.6* 12.4*   ??? HGB 10.0* 9.7* 9.2*   ??? HCT 32.5* 32.2* 30.3*   ??? PLT 94* 118* 123*       Recent Labs   Basename 05/05/09 0530 05/04/09 0325 05/03/09 1533 05/03/09 0255   ??? NA 159* 148* 162* --   ??? K 3.1* 3.5 4.7 --   ??? CL 125* 116* 128* --   ??? CO2 24 20* 22 --   ??? GLU 165* 483* 108* --    ??? BUN 44* 26* 24* --   ??? CREA 1.8* 1.5* 1.3 --   ??? CA 8.2* 8.3* 9.7 --   ??? MG 2.6* 2.2 -- 2.2   ??? PHOS 3.0 -- -- --   ??? ALB 2.4* -- -- 3.3*   ??? TBIL 4.5* -- -- 4.8*   ??? SGOT 170* -- -- 159*   ??? INR 3.0* -- -- --       Recent Labs   Basename 05/05/09 0409 05/04/09 0815 05/04/09 0603   ??? PH 7.46* 7.47* 7.40   ??? PCO2 31* 31* 39   ??? PO2 72* 507* 65*   ??? HCO3 21* 22 24   ??? FIO2 50 100 70         Imaging:  [x]    I have personally reviewed the patient???s radiographs  []    Radiographs reviewed with radiologist   []    No change from prior, tubes and lines in adequate position  []    Improved   [x]    Worsening     IMPRESSION:     The patient is: [x]    acutely ill Risk of deterioration: []    moderate    []    critically ill  [x]    high  Patient Active Hospital Problem List:  Nausea with vomiting (05/01/2009)    Hypokalemia (05/01/2009)    Hepatic encephalopathy (05/01/2009)    Gait abnormality (05/01/2009)    Alcoholic cirrhosis of liver (05/01/2009)    Hypomagnesemia (05/01/2009)      ??     PLAN:  1. Adjust vent settings  2. Adjust ivf  3. Cont iv abx  4. Renal fx worse  5. PUD/DVT prophylaxis  6. Fully sedated  7. Replete K     [x]    See my orders for details    My assessment/plan was discussed with:  [x]    nursing []    PT/OT    []    respiratory therapy []    Dr.   []    family []         []    Total critical care time exclusive of procedures       minutes  Vickki Muff, MD

## 2009-05-05 NOTE — Progress Notes (Signed)
Incontinent of large amount of liquid/watery green stool, cleaned, turned and repositioned.  Extra protective ointment applied to rectum.  Patient calm/relaxed unless stimulated  VSS, will monitor.

## 2009-05-05 NOTE — Progress Notes (Signed)
Propofol placed on standby BP 85/41.  Albumin x2 completed.  Will monitor.

## 2009-05-06 LAB — CBC WITH AUTOMATED DIFF
ABS. BASOPHILS: 0 10*3/uL (ref 0.0–0.1)
ABS. EOSINOPHILS: 0.3 10*3/uL (ref 0.0–0.4)
ABS. LYMPHOCYTES: 1.2 10*3/uL (ref 0.8–3.5)
ABS. MONOCYTES: 0.8 10*3/uL (ref 0.0–1.0)
ABS. NEUTROPHILS: 9.9 10*3/uL — ABNORMAL HIGH (ref 1.8–8.0)
BASOPHILS: 0 % (ref 0–1)
EOSINOPHILS: 2 % (ref 0–7)
HCT: 30.1 % — ABNORMAL LOW (ref 35.0–47.0)
HGB: 9.4 g/dL — ABNORMAL LOW (ref 11.5–16.0)
LYMPHOCYTES: 10 % — ABNORMAL LOW (ref 12–49)
MCH: 27.2 PG (ref 26.0–34.0)
MCHC: 31.2 g/dL (ref 30.0–36.5)
MCV: 87.2 FL (ref 80.0–99.0)
MONOCYTES: 7 % (ref 5–13)
NEUTROPHILS: 81 % — ABNORMAL HIGH (ref 32–75)
PLATELET: 86 10*3/uL — ABNORMAL LOW (ref 150–400)
RBC: 3.45 M/uL — ABNORMAL LOW (ref 3.80–5.20)
RDW: 19.5 % — ABNORMAL HIGH (ref 11.5–14.5)
WBC: 12.1 10*3/uL — ABNORMAL HIGH (ref 3.6–11.0)

## 2009-05-06 LAB — BLOOD GAS ARTERIAL,VENT
BASE DEFICIT: 2.9 mmol/L — AB
BICARBONATE: 21 mmol/L — ABNORMAL LOW (ref 22–26)
FIO2: 50 %
O2 SAT: 98 % — ABNORMAL HIGH (ref 92–97)
PCO2: 33 mmHg — ABNORMAL LOW (ref 35–45)
PEEP/CPAP: 5
PO2: 104 mmHg — ABNORMAL HIGH (ref 80–100)
SET RATE: 10
VT/PIP: 500
pH: 7.41 (ref 7.35–7.45)

## 2009-05-06 LAB — METABOLIC PANEL, COMPREHENSIVE
A-G Ratio: 0.7 — ABNORMAL LOW (ref 1.1–2.2)
ALT (SGPT): 101 U/L — ABNORMAL HIGH (ref 12–78)
AST (SGOT): 101 U/L — ABNORMAL HIGH (ref 15–37)
Albumin: 2.5 g/dL — ABNORMAL LOW (ref 3.5–5.0)
Alk. phosphatase: 129 U/L (ref 50–136)
Anion gap: 10 mmol/L (ref 5–15)
BUN/Creatinine ratio: 34 — ABNORMAL HIGH (ref 12–20)
BUN: 51 MG/DL — ABNORMAL HIGH (ref 6–20)
Bilirubin, total: 4.4 MG/DL — ABNORMAL HIGH (ref 0.2–1.0)
CO2: 23 MMOL/L (ref 21–32)
Calcium: 8.8 MG/DL (ref 8.5–10.1)
Chloride: 123 MMOL/L — ABNORMAL HIGH (ref 97–108)
Creatinine: 1.5 MG/DL — ABNORMAL HIGH (ref 0.6–1.3)
GFR est AA: 48 mL/min/{1.73_m2} — ABNORMAL LOW (ref >60)
GFR est non-AA: 39 mL/min/{1.73_m2} — ABNORMAL LOW (ref >60)
Globulin: 3.4 g/dL (ref 2.0–4.0)
Glucose: 195 MG/DL — ABNORMAL HIGH (ref 65–100)
Potassium: 3.5 MMOL/L (ref 3.5–5.1)
Protein, total: 5.9 g/dL — ABNORMAL LOW (ref 6.4–8.2)
Sodium: 156 MMOL/L — ABNORMAL HIGH (ref 136–145)

## 2009-05-06 LAB — AMMONIA: Ammonia, plasma: 44 umol/L — ABNORMAL HIGH (ref <32)

## 2009-05-06 LAB — ANA QL, W/REFLEX CASCADE
ANA, Direct: NOT DETECTED
ANA: NOT DETECTED

## 2009-05-06 LAB — CK: CK: 954 U/L — ABNORMAL HIGH (ref 21–215)

## 2009-05-06 MED ADMIN — sodium chloride (NS) flush 20 mL: @ 09:00:00 | NDC 87701099893

## 2009-05-06 MED ADMIN — aspirin (ASPIRIN) tablet 325 mg: ORAL | @ 13:00:00 | NDC 00904200960

## 2009-05-06 MED ADMIN — albuterol/ipratropium (DUONEB) neb solution: RESPIRATORY_TRACT | @ 16:00:00 | NDC 00487980101

## 2009-05-06 MED ADMIN — pantoprazole (PROTONIX) injection 40 mg: INTRAVENOUS | @ 13:00:00 | NDC 00409488810

## 2009-05-06 MED ADMIN — thiamine (B-1) 100 mg in 0.9% sodium chloride 50 mL IVPB: INTRAVENOUS | @ 13:00:00 | NDC 63323001302

## 2009-05-06 MED ADMIN — midazolam (VERSED) injection 2-4 mg: INTRAVENOUS | @ 10:00:00 | NDC 10019002837

## 2009-05-06 MED ADMIN — levothyroxine (SYNTHROID) tablet 50 mcg: ORAL | @ 13:00:00 | NDC 51079044401

## 2009-05-06 MED ADMIN — metoprolol tartrate (LOPRESSOR) injection 2.5 mg: INTRAVENOUS | @ 10:00:00 | NDC 00143987310

## 2009-05-06 MED ADMIN — midazolam (VERSED) injection 2-4 mg: INTRAVENOUS | @ 16:00:00 | NDC 10019002837

## 2009-05-06 MED ADMIN — metoprolol tartrate (LOPRESSOR) injection 2.5 mg: INTRAVENOUS | @ 22:00:00 | NDC 00143987310

## 2009-05-06 MED ADMIN — dextrose 5% with KCl 20 mEq/L infusion: INTRAVENOUS | @ 06:00:00 | NDC 00338068304

## 2009-05-06 MED ADMIN — vancomycin (VANCOCIN) 1 g in 0.9% sodium chloride 250 mL IVPB: INTRAVENOUS | @ 22:00:00 | NDC 63323031461

## 2009-05-06 MED ADMIN — midazolam (VERSED) injection 2-4 mg: INTRAVENOUS | @ 01:00:00 | NDC 10019002837

## 2009-05-06 MED ADMIN — albuterol/ipratropium (DUONEB) neb solution: RESPIRATORY_TRACT | @ 02:00:00 | NDC 00487980101

## 2009-05-06 MED ADMIN — albuterol/ipratropium (DUONEB) neb solution: RESPIRATORY_TRACT | @ 12:00:00 | NDC 00487980101

## 2009-05-06 MED ADMIN — dextrose 5% with KCl 20 mEq/L infusion: INTRAVENOUS | @ 18:00:00 | NDC 00338068304

## 2009-05-06 MED ADMIN — propofol (DIPRIVAN) infusion: INTRAVENOUS | NDC 63323026965

## 2009-05-06 MED ADMIN — lactulose (CHRONULAC) solution 20 g: ORAL | @ 13:00:00 | NDC 00121457730

## 2009-05-06 MED ADMIN — piperacillin-tazobactam (ZOSYN) 2.25 g in 0.9% sodium chloride (MBP/ADV) 50 mL MBP: INTRAVENOUS | @ 18:00:00 | NDC 00338055311

## 2009-05-06 MED ADMIN — sodium chloride (NS) 0.9 % flush: @ 17:00:00 | NDC 87701099893

## 2009-05-06 MED ADMIN — albuterol/ipratropium (DUONEB) neb solution: RESPIRATORY_TRACT | NDC 00487980101

## 2009-05-06 MED ADMIN — sodium chloride (NS) flush 10 mL: @ 10:00:00 | NDC 87701099893

## 2009-05-06 MED ADMIN — enoxaparin (LOVENOX) injection 60 mg: SUBCUTANEOUS | @ 05:00:00 | NDC 00075062160

## 2009-05-06 MED ADMIN — sodium chloride (NS) flush 10 mL: @ 01:00:00 | NDC 87701099893

## 2009-05-06 MED ADMIN — metoprolol tartrate (LOPRESSOR) injection 2.5 mg: INTRAVENOUS | @ 05:00:00 | NDC 00143987310

## 2009-05-06 MED ADMIN — piperacillin-tazobactam (ZOSYN) 2.25 g in 0.9% sodium chloride (MBP/ADV) 50 mL MBP: INTRAVENOUS | @ 01:00:00 | NDC 00338055311

## 2009-05-06 MED ADMIN — propofol (DIPRIVAN) infusion: INTRAVENOUS | @ 06:00:00 | NDC 63323026965

## 2009-05-06 MED ADMIN — albuterol/ipratropium (DUONEB) neb solution: RESPIRATORY_TRACT | @ 19:00:00 | NDC 00487980101

## 2009-05-06 MED ADMIN — piperacillin-tazobactam (ZOSYN) 2.25 g in 0.9% sodium chloride (MBP/ADV) 50 mL MBP: INTRAVENOUS | @ 10:00:00 | NDC 00338055311

## 2009-05-06 MED ADMIN — lactulose (CHRONULAC) solution 20 g: ORAL | @ 02:00:00 | NDC 00121457730

## 2009-05-06 MED ADMIN — metoprolol tartrate (LOPRESSOR) injection 2.5 mg: INTRAVENOUS | @ 16:00:00 | NDC 00143987310

## 2009-05-06 MED FILL — THIAMINE 100 MG/ML INJECTION: 100 mg/mL | INTRAMUSCULAR | Qty: 1

## 2009-05-06 MED FILL — LEVOTHYROXINE 25 MCG TAB: 25 mcg | ORAL | Qty: 2

## 2009-05-06 MED FILL — IPRATROPIUM BROMIDE 0.02 % SOLN FOR INHALATION: 0.02 % | RESPIRATORY_TRACT | Qty: 2.5

## 2009-05-06 MED FILL — ZOSYN 2.25 GRAM INTRAVENOUS SOLUTION: 2.25 gram | INTRAVENOUS | Qty: 2.25

## 2009-05-06 MED FILL — D5W WITH POTASSIUM CHLORIDE 20 MEQ/L IV: 20 mEq/L | INTRAVENOUS | Qty: 1000

## 2009-05-06 MED FILL — LACTULOSE 10 GRAM/15 ML ORAL SOLN: 10 gram/15 mL | ORAL | Qty: 30

## 2009-05-06 MED FILL — SALINE FLUSH INJECTION SYRINGE: INTRAMUSCULAR | Qty: 10

## 2009-05-06 MED FILL — METOPROLOL TARTRATE 5 MG/5 ML IV SOLN: 5 mg/ mL | INTRAVENOUS | Qty: 5

## 2009-05-06 MED FILL — MIDAZOLAM 1 MG/ML IJ SOLN: 1 mg/mL | INTRAMUSCULAR | Qty: 5

## 2009-05-06 MED FILL — ASPIRIN 325 MG TAB: 325 mg | ORAL | Qty: 1

## 2009-05-06 MED FILL — VANCOMYCIN 10 GRAM IV SOLR: 10 gram | INTRAVENOUS | Qty: 1

## 2009-05-06 MED FILL — SALINE FLUSH INJECTION SYRINGE: INTRAMUSCULAR | Qty: 20

## 2009-05-06 MED FILL — SALINE FLUSH INJECTION SYRINGE: INTRAMUSCULAR | Qty: 40

## 2009-05-06 MED FILL — LOVENOX 60 MG/0.6 ML SUBCUTANEOUS SYRINGE: 60 mg/0.6 mL | SUBCUTANEOUS | Qty: 1

## 2009-05-06 MED FILL — IPRATROPIUM BROMIDE 0.02 % SOLN FOR INHALATION: 0.02 % | RESPIRATORY_TRACT | Qty: 5

## 2009-05-06 MED FILL — SODIUM CHLORIDE 0.9 % INJECTION: INTRAMUSCULAR | Qty: 10

## 2009-05-06 NOTE — Progress Notes (Signed)
 Formatting of this note might be different from the original.  Verbal shift change report given to oncoming nurse.      Electronically signed by Edsel Shan BRAVO, RN at 05/06/2009  7:40 AM EDT

## 2009-05-06 NOTE — Progress Notes (Signed)
 Formatting of this note might be different from the original.  Attempted to visit patient for initial spiritual assessment.  Patient is intubated.  No family present.  Offered silent prayer at bedside and left card informing of availability of pastoral care.     Visit by Truman Cuna, MA, Chaplain   Electronically signed by Cuna Truman A at 05/06/2009 10:27 AM EDT

## 2009-05-06 NOTE — Progress Notes (Signed)
 Formatting of this note might be different from the original.  Dr. Tamea called and notified of patients condition.  Patient tachypnic, air hungry (gasping for air)  Orders received to place back on previous setting rate.  Close observation maintained.  Electronically signed by Acuna, Delia E at 05/06/2009  1:45 PM EDT

## 2009-05-06 NOTE — Progress Notes (Signed)
 Formatting of this note might be different from the original.  Ortho-    Placed well-molded cast on L upper extremity.  Will order x-rays to check alignment.    Lupita NOVAK. Boyd, MD    Electronically signed by Boyd Lupita NOVAK, MD at 05/06/2009  3:10 PM EDT

## 2009-05-06 NOTE — Procedures (Signed)
Procedures signed by Youlanda Roys, MD at 05/10/09 1305   Also signed by Youlanda Roys, MD at 05/10/09 1312                 Author: Youlanda Roys, MD  Service: --  Author Type: Physician       Filed: 05/10/09 1312  Date of Service: 05/06/09 0200  Status: Signed          Editor: Youlanda Roys, MD (Physician)            Procedure Orders        1. EEG [[id number]] ordered by Youlanda Roys, MD at 05/04/09 1338                         <!--EPICS--> Name:      Breanna Lester                 Admitted:   04/29/2009<BR> MR #:      [id number]                     DOB:        [birth year]<BR>  Account #: [id number]                  Age         48<BR> Ref MD:                                  Location:   [id number]<BR> <BR> <BR>                         ELECTROENCEPHALOGRAM REPORT<BR> <BR> <BR> DATE OF SERVICE:  05/05/2009<BR> <BR> CLINICAL  INDICATION FOR EEG:  A patient with abnormal movements, jerks. A<BR> patient with encephalopathy. A patient with altered mental status. A<BR> patient with possible seizures. A patient with hepatic cirrhosis and<BR> hepatic encephalopathy. For complete  indications, please see dictated note<BR> in the chart dated 05/04/2009.<BR> <BR> EEG CLASSIFICATION:  This patient is essentially normal asleep<BR> <BR> DESCRIPTION OF RECORD:  This recording contains nothing but sleep recorded<BR> through the whole  EEG. The patient had diffuse K complexes and sleep<BR> spindles seen throughout the recording. No clear areas of focal slowing. No<BR> spike or spike-and-wave discharges were seen. Stimulation of the patient<BR> produced little change. Photic stimulation  produced no change.<BR> Hyperventilation was not performed. The patient is intubated.<BR> <BR> INTERPRETATION:  This is essentially a normal sleep electroencephalogram<BR> with the patient continuously in a state of deep sleep due to her sedation.<BR>  No focal abnormities were identified. No spike or spike-and-wave discharges<BR> were  seen. The study will probably need to be repeated when the patient is<BR> less sedated and more awake. Clinical correlation recommended.<BR> <BR> <BR> <BR> Reviewed on  05/06/2009 7:13 AM<BR> <BR> <BR> <BR> <BR> <BR> Youlanda Roys, MD<BR> <BR> cc:   Youlanda Roys, MD<BR> <BR> <BR> <BR> TAS/wmx; D: 05/05/2009  9:36 P; T: 05/06/2009  2:00 A; DOC# [id number]; Job#<BR> [id number]<QI> <!--EPICE-->

## 2009-05-06 NOTE — Progress Notes (Signed)
 Formatting of this note is different from the original.  Neurology Progress Note    Patient ID:  Breanna Lester  [id number]  48 y.o.  Oct 20, 1960    Subjective:     Patient none..    Current facility-administered medications   Medication Dose Route Frequency   ? levothyroxine (SYNTHROID) tablet 50 mcg  50 mcg Oral ACB   ? lactulose (CHRONULAC) solution 20 g  20 g Oral BID   ? aspirin (ASPIRIN) tablet 325 mg  325 mg Oral DAILY   ? dextrose  5% with KCl 20 mEq/L infusion    IntraVENous CONTINUOUS   ? albumin human 5% (BUMINATE) solution 25 g  25 g IntraVENous Q4H PRN   ? vancomycin (VANCOCIN) 1 g in 0.9% sodium chloride  250 mL IVPB  1,000 mg IntraVENous Q24H   ? metoprolol  tartrate (LOPRESSOR ) injection 2.5 mg  2.5 mg IntraVENous Q6H   ? morphine  injection 1 mg  1 mg IntraVENous PRN   ? pantoprazole  (PROTONIX ) injection 40 mg  40 mg IntraVENous DAILY   ? levofloxacin (LEVAQUIN) 750 mg infusion   750 mg IntraVENous Q48H   ? piperacillin-tazobactam (ZOSYN) 2.25 g in 0.9% sodium chloride  (MBP/ADV) 50 mL MBP  2.25 g IntraVENous Q8H   ? albuterol /ipratropium (DUONEB ) neb solution   1 Dose Nebulization Q4HWA RT   ? midazolam (VERSED) injection 2-4 mg  2-4 mg IntraVENous Q1H PRN   ? sodium chloride  (NS) flush 10 mL  10 mL InterCATHeter Q8H   ? sodium chloride  (NS) flush 10 mL  10 mL InterCATHeter PRN   ? heparin (porcine) pf 300 Units  300 Units InterCATHeter PRN   ? sodium chloride  (NS) flush 20 mL  20 mL InterCATHeter PRN   ? DISCONTD: propofol  (DIPRIVAN ) infusion  5-50 mcg/kg/min IntraVENous TITRATE   ? DISCONTD: enoxaparin  (LOVENOX ) injection 60 mg  60 mg SubCUTAneous Q12H   ? DISCONTD: lorazepam (ATIVAN) injection 1-2 mg  1-2 mg IntraVENous Q6H PRN   ? acetaminophen  (TYLENOL ) suppository 650 mg  650 mg Rectal Q6H PRN   ? albuterol  (PROVENTIL  VENTOLIN ) nebulizer solution 2.5 mg  2.5 mg Nebulization Q2H PRN   ? thiamine (B-1) 100 mg in 0.9% sodium chloride  50 mL IVPB  100 mg IntraVENous DAILY   ? nitroglycerin (NITROBID) 2 %  ointment 2 Inch  2 Inch Topical Q6H PRN   ? saline peripheral flush 5 mL  5 mL InterCATHeter PRN         Review of Systems:    Review of systems not obtained due to patient factors.    Objective:     Patient Vitals in the past 8 hrs:   BP Temp Pulse Resp SpO2   05/06/09 1946 100/45 mmHg - 82  25  99 %   05/06/09 1800 91/43 mmHg - 79  24  98 %   05/06/09 1730 102/52 mmHg 99.5 F (37.5 C) 79  26  98 %   05/06/09 1700 96/46 mmHg - 73  24  99 %   05/06/09 1600 93/45 mmHg 98.3 F (36.8 C) 69  23  100 %   05/06/09 1522 - - 68  - 100 %   05/06/09 1500 102/53 mmHg - 72  30  100 %   05/06/09 1400 114/52 mmHg - 77  26  100 %   05/06/09 1347 - - 79  - 99 %   05/06/09 1300 117/58 mmHg - 86  34  99 %   05/06/09 1200 112/56 mmHg 99.1 F (  37.3 C) 77  31  99 %       In: 6050.6 (5250.6 I.V.)  Out: 2211 (1831 Urine)      Lab Review Recent Results (from the past 24 hour(s))   BLOOD GAS, ARTERIAL    Collection Time    05/06/09  4:03 AM   Component Value Range   ? pH 7.41  7.35 - 7.45 ( )   ? PCO2 33 (*) 35 - 45 (mmHg)   ? PO2 104 (*) 80 - 100 (mmHg)   ? O2 SAT 98 (*) 92 - 97 (%)   ? BICARBONATE 21 (*) 22 - 26 (mmol/L)   ? BASE DEFICIT 2.9 (*) BE NORMAL RANGE -3 T (mmol/L)   ? FIO2 50  (%)   ? SITE RR     ? MODE A/C     ? SET RATE 10  ( )   ? VT/PIP 500  ( )   ? PEEP/CPAP 5  ( )   ? SAMPLE SOURCE ARTERIAL     ? O2 METHOD VENTILATOR     CBC WITH AUTOMATED DIFF    Collection Time    05/06/09  4:45 AM   Component Value Range   ? WBC 12.1 (*) 3.6 - 11.0 (K/uL)   ? RBC 3.45 (*) 3.80 - 5.20 (M/uL)   ? HGB 9.4 (*) 11.5 - 16.0 (g/dL)   ? HCT 30.1 (*) 35.0 - 47.0 (%)   ? MCV 87.2  80.0 - 99.0 (FL)   ? MCH 27.2  26.0 - 34.0 (PG)   ? MCHC 31.2  30.0 - 36.5 (g/dL)   ? RDW 19.5 (*) 11.5 - 14.5 (%)   ? PLATELET 86 (*) 150 - 400 (K/uL)   ? NEUTROPHILS 81 (*) 32 - 75 (%)   ? LYMPHOCYTES 10 (*) 12 - 49 (%)   ? MONOCYTES 7  5 - 13 (%)   ? EOSINOPHILS 2  0 - 7 (%)   ? BASOPHILS 0  0 - 1 (%)   ? ABSOLUTE NEUTS 9.9 (*) 1.8 - 8.0 (K/UL)   ? ABSOLUTE LYMPHS  1.2  0.8 - 3.5 (K/UL)   ? ABSOLUTE MONOS 0.8  0.0 - 1.0 (K/UL)   ? ABSOLUTE EOSINS 0.3  0.0 - 0.4 (K/UL)   ? ABSOLUTE BASOS 0.0  0.0 - 0.1 (K/UL)   METABOLIC PANEL, COMPREHENSIVE    Collection Time    05/06/09  4:45 AM   Component Value Range   ? Sodium 156 (*) 136 - 145 (MMOL/L)   ? Potassium 3.5  3.5 - 5.1 (MMOL/L)   ? Chloride 123 (*) 97 - 108 (MMOL/L)   ? CO2 23  21 - 32 (MMOL/L)   ? Anion gap 10  5 - 15 (mmol/L)   ? Glucose 195 (*) 65 - 100 (MG/DL)   ? BUN 51 (*) 6 - 20 (MG/DL)   ? Creatinine 1.5 (*) 0.6 - 1.3 (MG/DL)   ? BUN/Creatinine ratio 34 (*) 12 - 20 ( )   ? GFR est AA 48 (*) >60 (ml/min/1.79m2)   ? GFR est non-AA 39 (*) >60 (ml/min/1.66m2)   ? Calcium 8.8  8.5 - 10.1 (MG/DL)   ? Bilirubin, total 4.4 (*) 0.2 - 1.0 (MG/DL)   ? ALT 101 (*) 12 - 78 (U/L)   ? AST 101 (*) 15 - 37 (U/L)   ? Alk. phosphatase 129  50 - 136 (U/L)   ? Protein, total 5.9 (*) 6.4 -  8.2 (g/dL)   ? Albumin 2.5 (*) 3.5 - 5.0 (g/dL)   ? Globulin 3.4  2.0 - 4.0 (g/dL)   ? A-G Ratio 0.7 (*) 1.1 - 2.2 ( )   CK    Collection Time    05/06/09  4:45 AM   Component Value Range   ? CK 954 (*) 21 - 215 (U/L)   AMMONIA    Collection Time    05/06/09  4:45 AM   Component Value Range   ? Ammonia 44 (*) <32 (UMOL/L)     Additional comments:I reviewed the patient's other test results. Labs and EEG    NEUROLOGICAL EXAM:    Appearance:  The patient is well developed, well nourished, comatose   Mental Status: Comatose and intubated   Cranial Nerves:   untestable visual fields. Fundi are benign. PERLA, EOM's full, no nystagmus, no ptosis. Facial sensation is normal. Corneal reflexes are intact. Facial movement is symmetric.    Motor:  Quadriparetic due to sedation. Normal bulk and tone. No fasciculations.   Reflexes:   Deep tendon reflexes 1+/4 and symmetrical.   Sensory:   Withdraws poorly to pain   Gait:  untestable gait.   Tremor:   No tremor noted.   Cerebellar:  No cerebellar signs present.   Neurovascular:  Normal heart sounds and regular rhythm,  peripheral pulses intact, and no carotid bruits.     Assessment:     Patient Active Hospital Problem List:  Nausea with vomiting (05/01/2009)    Hypokalemia (05/01/2009)    Hepatic encephalopathy (05/01/2009)    Gait abnormality (05/01/2009)    Alcoholic cirrhosis of liver (05/01/2009)    Hypomagnesemia (05/01/2009)    Plan:   Encephalopathy, hepatic with sedation  Not much to do now neurologiclly, no seizures  EEG showed sleep only    Signed:  Thomas A. Claudene, MD  05/06/2009  7:56 PM    Electronically signed by Claudene Debby LABOR, MD at 05/06/2009  8:00 PM EDT

## 2009-05-06 NOTE — Progress Notes (Signed)
 Formatting of this note is different from the original.       Hospitalist Progress Note         NAME: Breanna Lester        DOB:  [birth year]        MRN:  [id number]      Assessment & Plan   Sepsis syndrome :underlying pneumonia, fever, leukocytosis  Broad spectrum antibiotics.  No evidence of UTI or ascites(SBP), has an umbilical hernia that is not     reducible but overall adbomen is soft.    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    Left wrist Fracture: casted as per Ortho ORIF when stable    Hypothyroidism: although very low TSH, rest of panel WNL, start L-Thyroxine 50mcg     Subjective:     48yo caucasian female    Chief Complaint:  In ICU intubated with acute respiratory failure     Discussed with RN events overnight.     Review of Systems:  Fever/chills y   Cough N   Sputum    N   SOB/DOE  N   Chest Pain N   Abdominal Pain N   Diarrhea N   Constipation N   Nausea/Vomiting N   Dysuria N   Tolerating PT N   Tolerating Diet N   Could not obtain due to AMS vs intubation xx       Objective:   Patient intubated this AM with acute  respiratory failure, febrile, tachycardic, mild hypotension, patient critically ill with bad prognosis.    VITALS:   Last 24hrs VS reviewed since prior progress note. Most recent are:  Visit Vitals   Item Reading   ? BP 90/45   ? Pulse 77   ? Temp 99.5 F (37.5 C)   ? Resp 26   ? Ht 5' 2 (1.575 m)   ? Wt 144 lb 13.5 oz (65.7 kg)   ? SpO2 100%   ? PF 65L/min     Intake/Output Summary (Last 24 hours) at 05/06/09 0745  Last data filed at 05/06/09 0700   Gross per 24 hour   Intake 4250.05 ml   Output   1246 ml   Net 3004.05 ml       Telemetry Reviewed:     PHYSICAL EXAM:  General: Intubated, sedated  HEENT: Pupils unequal, R 3mm, L 2mm  Lungs:  Bilateral rales, ronchi and mild wheezing.  Heart:  Regular rhythm, tachycardic, SEM  Abdomen: Soft, Non distended, hypoactive BS, no rebound, umbilical hernia non reducible  Extremities: No edema, pulses present, brace in left arm  Neurologic: GCS [id number], sedated  Psych: sedated  Lab Data Reviewed: (see below)    Medications Reviewed: (see below)    PMH/SH reviewed - no change compared to H&P  ______________________________________________________________________  Total time spent with patient: 45 minutes     Critical Care Provided     Minutes non procedure based    Care Plan discussed with:  Patient    Family    RN x   Care Manager    Consultant/Specialist      >50% of visit spent in counseling and coordination of care       Prophylaxis:  GI PPI   DVT SCD     Disposition:   Home with Family    HH/PT/OT/RN    SNF/LTC    [facility]      ______________________________________________________________________  Attending Physician: Eric DOROTHA Deutscher, MD   _____________________________________________________________________________________________________  Procedures: see electronic medical records for all procedures/Xrays and details  which were not copied into this note but were reviewed prior to creation of Plan.      LABS:  Recent Labs   Basename 05/06/09 0445 05/05/09 0530   ?  WBC 12.1* 12.0*   ? HGB 9.4* 10.0*   ? HCT 30.1* 32.5*   ? PLT 86* 94*     Recent Labs   Basename 05/06/09 0445 05/05/09 0530 05/04/09 0325   ? NA 156* 159* 148*   ? K 3.5 3.1* 3.5   ? CL 123* 125* 116*   ? CO2 23 24 20*   ? BUN 51* 44* 26*   ? CREA 1.5* 1.8* 1.5*   ? GLU 195* 165* 483*   ? CA 8.8 8.2* 8.3*   ? MG -- 2.6* 2.2   ? PHOS -- 3.0 --   ? URICA -- -- --     Recent Labs   Basename 05/06/09 0445 05/05/09 0530   ? SGOT 101* 170*   ? GPT 101* 108*   ? AP 129 144*   ? TBIL 4.4* 4.5*   ? TP 5.9* 6.0*   ? ALB 2.5* 2.4*   ? GLOB 3.4 3.6   ? GGT -- --   ? AML -- --   ? LPSE -- --     Recent Labs   Basename 05/05/09 0530   ? INR 3.0*   ? PTP 28.9*   ? APTT --         No results found for this basename: FE:2,TIBC:2,PSAT:2,FERR:2, in the last 72 hours   Recent Labs   Basename 05/06/09 0403 05/05/09 0409   ? PH 7.41 7.46*   ? PCO2 33* 31*   ? PO2 104* 72*     Recent Labs   Basename 05/06/09 0445 05/05/09 0530 05/04/09 1650   ? CPK 954* 1388* 2222*   ? CKMB -- -- --     Lab Results   Component Value Date/Time    POC GLUCOSE 114 05/01/2009  4:40 PM    POC GLUCOSE 162 05/01/2009 12:02 PM    POC GLUCOSE 115 05/01/2009  8:39 AM     Lab Results   Component Value Date/Time    Color YELLOW 04/30/2009 12:14 AM    Appearance CLEAR 04/30/2009 12:14 AM    Specific gravity 1.010 10/29/2008 11:00 AM    Specific gravity 1.011 04/30/2009 12:14 AM    pH 6.0 04/30/2009 12:14 AM    Protein NEGATIVE  04/30/2009 12:14 AM    Glucose NEGATIVE  04/30/2009 12:14 AM    Ketone NEGATIVE  04/30/2009 12:14 AM  Bilirubin NEGATIVE  04/30/2009 12:14 AM    Urobilinogen 2.0 04/30/2009 12:14 AM    Nitrites NEGATIVE  04/30/2009 12:14 AM    LeukocyteEsterase TRACE 04/30/2009 12:14 AM    Epithelial cells 10-20 04/30/2009 12:14 AM    Bacteria NEGATIVE  04/30/2009 12:14 AM    WBC 0-4 04/30/2009 12:14 AM    RBC 0-3 04/30/2009 12:14 AM     MEDICATIONS:  Current facility-administered medications   Medication Dose Route Frequency   ? levothyroxine (SYNTHROID) tablet 50 mcg  50 mcg Oral ACB    ? propofol (DIPRIVAN) 10 mg/mL injection        ? sodium chloride  0.9 % bolus infusion 500 mL  500 mL IntraVENous ONCE   ? lactulose (CHRONULAC) solution 20 g  20 g Oral BID   ? aspirin (ASPIRIN) tablet 325 mg  325 mg Oral DAILY   ? potassium chloride  20 mEq in 50 ml IVPB   20 mEq IntraVENous Q1H   ? dextrose 5% with KCl 20 mEq/L infusion    IntraVENous CONTINUOUS   ? albumin human 5% (BUMINATE) solution 25 g  25 g IntraVENous Q4H PRN   ? vancomycin (VANCOCIN) 1 g in 0.9% sodium chloride  250 mL IVPB  1,000 mg IntraVENous Q24H   ? metoprolol  tartrate (LOPRESSOR ) injection 2.5 mg  2.5 mg IntraVENous Q6H   ? DISCONTD: dextrose 5% - 0.45% NaCl with KCl 20 mEq/L infusion    IntraVENous CONTINUOUS   ? propofol (DIPRIVAN) infusion  5-50 mcg/kg/min IntraVENous TITRATE   ? morphine  injection 1 mg  1 mg IntraVENous PRN   ? pantoprazole  (PROTONIX ) injection 40 mg  40 mg IntraVENous DAILY   ? levofloxacin (LEVAQUIN) 750 mg infusion   750 mg IntraVENous Q48H   ? piperacillin-tazobactam (ZOSYN) 2.25 g in 0.9% sodium chloride  (MBP/ADV) 50 mL MBP  2.25 g IntraVENous Q8H   ? albuterol /ipratropium (DUONEB ) neb solution   1 Dose Nebulization Q4HWA RT   ? midazolam (VERSED) injection 2-4 mg  2-4 mg IntraVENous Q1H PRN   ? enoxaparin  (LOVENOX ) injection 60 mg  60 mg SubCUTAneous Q12H   ? lorazepam (ATIVAN) injection 1-2 mg  1-2 mg IntraVENous Q6H PRN   ? sodium chloride  (NS) flush 10 mL  10 mL InterCATHeter Q8H   ? sodium chloride  (NS) flush 10 mL  10 mL InterCATHeter PRN   ? heparin (porcine) pf 300 Units  300 Units InterCATHeter PRN   ? sodium chloride  (NS) flush 20 mL  20 mL InterCATHeter PRN   ? propofol (DIPRIVAN) 10 mg/mL injection        ? DISCONTD: vancomycin (VANCOCIN) 1.25 g in 0.9% sodium chloride  250 mL IVPB  1,250 mg IntraVENous Q24H   ? DISCONTD: metoprolol  tartrate (LOPRESSOR ) injection 5 mg  5 mg IntraVENous Q6H   ? acetaminophen  (TYLENOL ) suppository 650 mg  650 mg Rectal Q6H PRN   ? albuterol  (PROVENTIL  VENTOLIN )  nebulizer solution 2.5 mg  2.5 mg Nebulization Q2H PRN   ? thiamine (B-1) 100 mg in 0.9% sodium chloride  50 mL IVPB  100 mg IntraVENous DAILY   ? nitroglycerin (NITROBID) 2 % ointment 2 Inch  2 Inch Topical Q6H PRN   ? saline peripheral flush 5 mL  5 mL InterCATHeter PRN   ? DISCONTD: SALINE PERIPHERAL FLUSH Q8H 5 mL  5 mL InterCATHeter Q8H       Electronically signed by Andris Eric PARAS, MD at 05/06/2009  7:57 AM EDT

## 2009-05-06 NOTE — Progress Notes (Signed)
 Formatting of this note is different from the original.  PULMONARY/Critical Care/SLEEP MEDICINE - [facility]  Name: Breanna Lester   DOB: 1960/10/25   MRN: [id number]   Date: 05/06/2009 10:59 AM   [x]     I have reviewed the flowsheet and previous day?s notes.  [x]    The patient is unable to give any meaningful history or review of systems because the patient is:  [x]    Intubated []       [x]    Sedated    []    Unresponsive      [x]    The patient is critically ill on      [x]    Mechanical ventilation []    Pressors   []    BiPAP []           MND:Mzcpzt of systems not obtained due to patient factors.    Vital Signs:    BP 89/42  Pulse 74  Temp 99.2 F (37.3 C)  Resp 25  Ht 5' 2 (1.575 m)  Wt 144 lb 13.5 oz (65.7 kg)  SpO2 100%  PF 65 L/min    O2 Device: Endotracheal tube   O2 Flow Rate (L/min): 2 l/min   Temp (24hrs), Avg:98.9 F (37.2 C), Min:97.8 F (36.6 C), Max:99.5 F (37.5 C)      Intake/Output:   Last shift:      In: 512.2 (512.2 I.V.)  Out: 115 (115 Urine)    Last 3 shifts: In: 6170.6 (5220.6 I.V.)  Out: 2139 (1569 Urine)      Intake/Output Summary (Last 24 hours) at 05/06/09 1059  Last data filed at 05/06/09 1000   Gross per 24 hour   Intake 4117.05 ml   Output   1330 ml   Net 2787.05 ml     Hemodynamics:   PAP:     Wedge:     CVP:      CO:     CI:     SVR:     PVR:        Ventilator Settings:  Ventilator  Mode: Assist control  Respiratory Rate  Resp Rate Set: 10   Resp Rate Observed: 27   Insp Flow (l/min): 65 l/sec  I:E Ratio: 1:1.6  Ventilator Volumes  Vt Set (ml): 500 ml  Vt Observed (ml): 591 ml  Ve Observed (l/min): 15.7 l/min  Ventilator Pressures  PIP Observed (cm H2O): 26 cm H2O  Plateau Pressure (cm H2O): 26 cm H2O  MAP (cm H2O): 13   PEEP/CPAP (cm H2O): 5 cm H20    Physical Exam:    General: [x]    Intubated/sedated []    No acute distress []        [x]    Ill appearing []     []          HEENT: []    Icteric [x]    PERRL []        [x]    Anicteric Mucosa:[x]    moist   []    dry []          Resp: []    Wheeze []    Clear to ascultation bilaterally []    Accessory muscle use    [x]    Rales []    Chest tube:  []    Air leak []        []    Ronchi []    Crepitus []       [x]    Coarse bilateral ventilator breath sounds     CV: [x]    Regular []    Tachycardia []        []   Irregular []    Bradycardic []        []    Murmur []    S3 []        []    Edema []    S4 []         GI: [x]    Soft  []    NG Tube Bowel sounds: []    present [x]    absent    []    Firm []    PEG []        []    Distended  []         GU: [x]    Foley []    Hematuria []        [x]    Clear urine []    Edema []         MSK:    [x]    SCD?s []    Edema []        [x]    No deformities []     []         Skin: [x]    Warm [x]    Dry  []        []    Cool []    Moist []        []    Hot  []    Diaphoretic []        []    Rash  []    Cyanosis []         N-psych: [x]    Sedated  []    Agitated []        []    Alert  Follows commands:   []    Yes  [x]    No []         Devices: [x]    ET-Tube []    Tracheostomy []    Chest tube: Air leak? []    Y/[]    N    []    Central Line []    PA Catheter []        []    PICC []     []         DATA:   Current facility-administered medications   Medication Dose Route Frequency   ? levothyroxine (SYNTHROID) tablet 50 mcg  50 mcg Oral ACB   ? propofol (DIPRIVAN) 10 mg/mL injection        ? lactulose (CHRONULAC) solution 20 g  20 g Oral BID   ? aspirin (ASPIRIN) tablet 325 mg  325 mg Oral DAILY   ? potassium chloride  20 mEq in 50 ml IVPB   20 mEq IntraVENous Q1H   ? dextrose 5% with KCl 20 mEq/L infusion    IntraVENous CONTINUOUS   ? albumin human 5% (BUMINATE) solution 25 g  25 g IntraVENous Q4H PRN   ? vancomycin (VANCOCIN) 1 g in 0.9% sodium chloride  250 mL IVPB  1,000 mg IntraVENous Q24H   ? metoprolol  tartrate (LOPRESSOR ) injection 2.5 mg  2.5 mg IntraVENous Q6H   ? propofol (DIPRIVAN) infusion  5-50 mcg/kg/min IntraVENous TITRATE   ? morphine  injection 1 mg  1 mg IntraVENous PRN   ? pantoprazole  (PROTONIX ) injection 40 mg  40 mg IntraVENous DAILY   ? levofloxacin  (LEVAQUIN) 750 mg infusion   750 mg IntraVENous Q48H   ? piperacillin-tazobactam (ZOSYN) 2.25 g in 0.9% sodium chloride  (MBP/ADV) 50 mL MBP  2.25 g IntraVENous Q8H   ? albuterol /ipratropium (DUONEB ) neb solution   1 Dose Nebulization Q4HWA RT   ? midazolam (VERSED) injection 2-4 mg  2-4 mg IntraVENous Q1H PRN   ? lorazepam (ATIVAN) injection 1-2 mg  1-2 mg IntraVENous Q6H PRN   ? sodium chloride  (NS) flush 10 mL  10  mL InterCATHeter Q8H   ? sodium chloride  (NS) flush 10 mL  10 mL InterCATHeter PRN   ? heparin (porcine) pf 300 Units  300 Units InterCATHeter PRN   ? sodium chloride  (NS) flush 20 mL  20 mL InterCATHeter PRN   ? DISCONTD: vancomycin (VANCOCIN) 1.25 g in 0.9% sodium chloride  250 mL IVPB  1,250 mg IntraVENous Q24H   ? DISCONTD: metoprolol  tartrate (LOPRESSOR ) injection 5 mg  5 mg IntraVENous Q6H   ? DISCONTD: enoxaparin  (LOVENOX ) injection 60 mg  60 mg SubCUTAneous Q12H   ? acetaminophen  (TYLENOL ) suppository 650 mg  650 mg Rectal Q6H PRN   ? albuterol  (PROVENTIL  VENTOLIN ) nebulizer solution 2.5 mg  2.5 mg Nebulization Q2H PRN   ? thiamine (B-1) 100 mg in 0.9% sodium chloride  50 mL IVPB  100 mg IntraVENous DAILY   ? nitroglycerin (NITROBID) 2 % ointment 2 Inch  2 Inch Topical Q6H PRN   ? saline peripheral flush 5 mL  5 mL InterCATHeter PRN   ? DISCONTD: SALINE PERIPHERAL FLUSH Q8H 5 mL  5 mL InterCATHeter Q8H     Telemetry: [x]    Sinus []    A-flutter []    Paced    []    A-fib []    Multiple PVC?s     Labs:  Recent Labs   Basename 05/06/09 0445 05/05/09 0530 05/04/09 0325   ? WBC 12.1* 12.0* 14.6*   ? HGB 9.4* 10.0* 9.7*   ? HCT 30.1* 32.5* 32.2*   ? PLT 86* 94* 118*     Recent Labs   Basename 05/06/09 0445 05/05/09 0530 05/04/09 0325   ? NA 156* 159* 148*   ? K 3.5 3.1* 3.5   ? CL 123* 125* 116*   ? CO2 23 24 20*   ? GLU 195* 165* 483*   ? BUN 51* 44* 26*   ? CREA 1.5* 1.8* 1.5*   ? CA 8.8 8.2* 8.3*   ? MG -- 2.6* 2.2   ? PHOS -- 3.0 --   ? ALB 2.5* 2.4* --   ? TBIL 4.4* 4.5* --   ? SGOT 101* 170* --   ? INR --  3.0* --     Recent Labs   Basename 05/06/09 0403 05/05/09 0409 05/04/09 0815   ? PH 7.41 7.46* 7.47*   ? PCO2 33* 31* 31*   ? PO2 104* 72* 507*   ? HCO3 21* 21* 22   ? 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 Adjust vent settings  2. Cont iv abx  3. Cont ivf with d5w  4. Jet nebs  5. PUD/DVT prophylaxis  6. DC sedation     [x]    See my  orders for details    My assessment/plan was discussed with:  [x]    nursing []    PT/OT    []    respiratory therapy []    Dr.   Neysa    family []         []    Total critical care time exclusive of procedures       minutes  Catalina FABIENE Sanders, MD      Electronically signed by Sanders Catalina SAUNDERS, MD at 05/06/2009 11:01 AM EDT

## 2009-05-06 NOTE — Unmapped (Signed)
 Formatting of this note is different from the original.  Cardiac Multidisciplinary Rounds   05/06/2009     Patient Information:  NAME:   Breanna Lester          AGE:   48 y.o.        ADMISSION DATE:   04/29/2009        ADMITTING PHYSICIAN:   Katheryne Elsie Raddle., MD         PRIMARY CARE PHYSICIAN:     Mencias        *CARDIOLOGIST:     Vonzell      ________________________________________________________________________  ________________________________________________________________________     Primary Cardiac Diagnosis:        []        CHF    []        Arrhythmia     [x]        MI              []        Chest Pain            []       Cardiac Surgery     Problem List: (Address Problems Pertinent to This Admission Only)      AMS, hypokalemia, lft upper extremity pain  AMS, hypokalemia, lft upper extremity pain  AMS, hypokalemia, lft upper extremity pain        Problem List Never Reviewed      Class    Nausea with vomiting [787.01]       Hypokalemia [276.8A]       Hepatic encephalopathy [572.2]       Gait abnormality [781.2N]       Alcoholic cirrhosis of liver [571.2]       Hypomagnesemia [275.2B]          __________________________________________________________________   __________________________________________________________________      Cardiac Surgery or Procedure:   none  Date:          none  Pacemaker:  []      AICD:  []      BIVICD: []        ___________________________________________________________________  ___________________________________________________________________    EF:   55-60      %      ___________________________________________________________________  ___________________________________________________________________    MEDICATIONS :     (include dosage with patients medication)    *ACE/ARB if EF <40%:  No                                        If none, has MD documented why?        *Beta Blocker :    2.5 Metoprolol  every 6 hours                            If none, has MD documented why?        Diuretic :   nopne     Anticoagulant -  Heparin  -   Dose :                    PTT :          Lovenox   -   Dose:  60 every 12 hours        Coumadin  Y []     N [x]   INR : 3.0         9-15     Plavix:       ASA :   325mg   daily    *ASA on arrival ( if AMI ) :    Not AMI                                       If AMI and none given,has MD documented why?        Statin :        If AMI and none ordered, has MD documented why?      Cardiac Surgery Patients:     Insulin  Protocol (glucose average <120 surgery to POD 3):        ___________________________________________________________________  ___________________________________________________________________       Vaccination Information:      There is no immunization history on file for this patient.       a) PNA:    b) FLU:         If patient declines, is it documented? []    Yes            [x]    No     ___________________________________________________________________  ___________________________________________________________________         Lines & Drains    a) Central Line:  [x]         Placement Date:  Potassium IVF and Sedation and medications while vented      Clinical reason for continuing PICC :           b) Foley Catheter:   []          Placement Date:   04-29-09       Clinical reason for continuing foley :   Prevent further skin breakdown from incontinence while intubated and monitor output?      Cardiac Surgery Pts: foley d/c'd by POD 2?  []    Yes  []    No        ______________________________________________________________________  ______________________________________________________________________       Activity Level:     Bedrest     DVT Prophylaxis (if indicated): Lovenox       [x]    Yes      []    No     ______________________________________________________________________  ______________________________________________________________________     Discharge Planning:      a) Pt Admitted From:   Home       b) Discharge to:      []    Home      []   Rehab      []   SN[facility]      []    Home with Home Health      c) Support system- no family seen in past few days     ______________________________________________________________________  ______________________________________________________________________     Consults:    a) Cardiac Rehab  []         b) Dietary   []         c) Care Management [x]         d) Palliative Care Consult   []        ( Consider for communication of goals of care, assistance with impaired family dynamics, poor support system, symptom management assoc with chronic illness such as pain, anxiety, GI symptoms)      e) Other :        ______________________________________________________________________  ______________________________________________________________________     Care Plan/Pathway:   (  which ones are initiated and updated?)        CHF []         AMI  []         CABG  []         Cardiac Surgery Valve []         Cath Lab procedure []         Other:      Patient no on specific pathyway related to cardiac issues          History of Smoking within the past year?:  No quit 02/2008                 []    Yes      [x]    No       Smoking Cessation Education Provided and documented:      []    Yes      [x]    No       ______________________________________________________________________       Patient / Family Goals For Today: Comfort, Family education on patient condition and medications             Electronically signed by Ferguson, Kyleigh E, RN at 05/06/2009  3:45 AM EDT

## 2009-05-06 NOTE — Progress Notes (Signed)
 Formatting of this note is different from the original.  Cardiology Progress Note    05/06/2009 9:20 AM    Admit Date: 04/29/2009    Subjective:     Remains critically ill--intubated, sedated, on vent.    BP 98/49  Pulse 79  Temp 99.3 F (37.4 C)  Resp 19  Ht 5' 2 (1.575 m)  Wt 144 lb 13.5 oz (65.7 kg)  SpO2 100%  PF 65 L/min  Current facility-administered medications   Medication Dose Route Frequency   ? levothyroxine (SYNTHROID) tablet 50 mcg  50 mcg Oral ACB   ? propofol  (DIPRIVAN ) 10 mg/mL injection        ? lactulose (CHRONULAC) solution 20 g  20 g Oral BID   ? aspirin (ASPIRIN) tablet 325 mg  325 mg Oral DAILY   ? potassium chloride  20 mEq in 50 ml IVPB   20 mEq IntraVENous Q1H   ? dextrose  5% with KCl 20 mEq/L infusion    IntraVENous CONTINUOUS   ? albumin human 5% (BUMINATE) solution 25 g  25 g IntraVENous Q4H PRN   ? vancomycin (VANCOCIN) 1 g in 0.9% sodium chloride  250 mL IVPB  1,000 mg IntraVENous Q24H   ? metoprolol  tartrate (LOPRESSOR ) injection 2.5 mg  2.5 mg IntraVENous Q6H   ? DISCONTD: dextrose  5% - 0.45% NaCl with KCl 20 mEq/L infusion    IntraVENous CONTINUOUS   ? propofol  (DIPRIVAN ) infusion  5-50 mcg/kg/min IntraVENous TITRATE   ? morphine  injection 1 mg  1 mg IntraVENous PRN   ? pantoprazole  (PROTONIX ) injection 40 mg  40 mg IntraVENous DAILY   ? levofloxacin (LEVAQUIN) 750 mg infusion   750 mg IntraVENous Q48H   ? piperacillin-tazobactam (ZOSYN) 2.25 g in 0.9% sodium chloride  (MBP/ADV) 50 mL MBP  2.25 g IntraVENous Q8H   ? albuterol /ipratropium (DUONEB ) neb solution   1 Dose Nebulization Q4HWA RT   ? midazolam (VERSED) injection 2-4 mg  2-4 mg IntraVENous Q1H PRN   ? lorazepam (ATIVAN) injection 1-2 mg  1-2 mg IntraVENous Q6H PRN   ? sodium chloride  (NS) flush 10 mL  10 mL InterCATHeter Q8H   ? sodium chloride  (NS) flush 10 mL  10 mL InterCATHeter PRN   ? heparin (porcine) pf 300 Units  300 Units InterCATHeter PRN   ? sodium chloride  (NS) flush 20 mL  20 mL InterCATHeter PRN   ?  DISCONTD: vancomycin (VANCOCIN) 1.25 g in 0.9% sodium chloride  250 mL IVPB  1,250 mg IntraVENous Q24H   ? DISCONTD: metoprolol  tartrate (LOPRESSOR ) injection 5 mg  5 mg IntraVENous Q6H   ? DISCONTD: enoxaparin  (LOVENOX ) injection 60 mg  60 mg SubCUTAneous Q12H   ? acetaminophen  (TYLENOL ) suppository 650 mg  650 mg Rectal Q6H PRN   ? albuterol  (PROVENTIL  VENTOLIN ) nebulizer solution 2.5 mg  2.5 mg Nebulization Q2H PRN   ? thiamine (B-1) 100 mg in 0.9% sodium chloride  50 mL IVPB  100 mg IntraVENous DAILY   ? nitroglycerin (NITROBID) 2 % ointment 2 Inch  2 Inch Topical Q6H PRN   ? saline peripheral flush 5 mL  5 mL InterCATHeter PRN   ? DISCONTD: SALINE PERIPHERAL FLUSH Q8H 5 mL  5 mL InterCATHeter Q8H         Objective:     Physical Exam:  Intubated, sedated on vent  Chest--clear  CV--sl irreg S1S2 II/VI sem  Abd--benign  Ext--unchanged    Data Review:   Labs:  Recent Results (from the past 24 hour(s))   BLOOD  GAS, ARTERIAL    Collection Time    05/06/09  4:03 AM   Component Value Range   ? pH 7.41  7.35 - 7.45 ( )   ? PCO2 33 (*) 35 - 45 (mmHg)   ? PO2 104 (*) 80 - 100 (mmHg)   ? O2 SAT 98 (*) 92 - 97 (%)   ? BICARBONATE 21 (*) 22 - 26 (mmol/L)   ? BASE DEFICIT 2.9 (*) BE NORMAL RANGE -3 T (mmol/L)   ? FIO2 50  (%)   ? SITE RR     ? MODE A/C     ? SET RATE 10  ( )   ? VT/PIP 500  ( )   ? PEEP/CPAP 5  ( )   ? SAMPLE SOURCE ARTERIAL     ? O2 METHOD VENTILATOR     CBC WITH AUTOMATED DIFF    Collection Time    05/06/09  4:45 AM   Component Value Range   ? WBC 12.1 (*) 3.6 - 11.0 (K/uL)   ? RBC 3.45 (*) 3.80 - 5.20 (M/uL)   ? HGB 9.4 (*) 11.5 - 16.0 (g/dL)   ? HCT 30.1 (*) 35.0 - 47.0 (%)   ? MCV 87.2  80.0 - 99.0 (FL)   ? MCH 27.2  26.0 - 34.0 (PG)   ? MCHC 31.2  30.0 - 36.5 (g/dL)   ? RDW 19.5 (*) 11.5 - 14.5 (%)   ? PLATELET 86 (*) 150 - 400 (K/uL)   ? NEUTROPHILS 81 (*) 32 - 75 (%)   ? LYMPHOCYTES 10 (*) 12 - 49 (%)   ? MONOCYTES 7  5 - 13 (%)   ? EOSINOPHILS 2  0 - 7 (%)   ? BASOPHILS 0  0 - 1 (%)   ? ABSOLUTE NEUTS  9.9 (*) 1.8 - 8.0 (K/UL)   ? ABSOLUTE LYMPHS 1.2  0.8 - 3.5 (K/UL)   ? ABSOLUTE MONOS 0.8  0.0 - 1.0 (K/UL)   ? ABSOLUTE EOSINS 0.3  0.0 - 0.4 (K/UL)   ? ABSOLUTE BASOS 0.0  0.0 - 0.1 (K/UL)   METABOLIC PANEL, COMPREHENSIVE    Collection Time    05/06/09  4:45 AM   Component Value Range   ? Sodium 156 (*) 136 - 145 (MMOL/L)   ? Potassium 3.5  3.5 - 5.1 (MMOL/L)   ? Chloride 123 (*) 97 - 108 (MMOL/L)   ? CO2 23  21 - 32 (MMOL/L)   ? Anion gap 10  5 - 15 (mmol/L)   ? Glucose 195 (*) 65 - 100 (MG/DL)   ? BUN 51 (*) 6 - 20 (MG/DL)   ? Creatinine 1.5 (*) 0.6 - 1.3 (MG/DL)   ? BUN/Creatinine ratio 34 (*) 12 - 20 ( )   ? GFR est AA 48 (*) >60 (ml/min/1.58m2)   ? GFR est non-AA 39 (*) >60 (ml/min/1.45m2)   ? Calcium 8.8  8.5 - 10.1 (MG/DL)   ? Bilirubin, total 4.4 (*) 0.2 - 1.0 (MG/DL)   ? ALT 101 (*) 12 - 78 (U/L)   ? AST 101 (*) 15 - 37 (U/L)   ? Alk. phosphatase 129  50 - 136 (U/L)   ? Protein, total 5.9 (*) 6.4 - 8.2 (g/dL)   ? Albumin 2.5 (*) 3.5 - 5.0 (g/dL)   ? Globulin 3.4  2.0 - 4.0 (g/dL)   ? A-G Ratio 0.7 (*) 1.1 - 2.2 ( )   CK  Collection Time    05/06/09  4:45 AM   Component Value Range   ? CK 954 (*) 21 - 215 (U/L)   AMMONIA    Collection Time    05/06/09  4:45 AM   Component Value Range   ? Ammonia 44 (*) <32 (UMOL/L)     Telemetry: normal sinus rhythm    Assessment:     Patient Active Hospital Problem List:  Nausea with vomiting (05/01/2009)    Hypokalemia (05/01/2009)    Hepatic encephalopathy (05/01/2009)    Gait abnormality (05/01/2009)    Alcoholic cirrhosis of liver (05/01/2009)    Hypomagnesemia (05/01/2009)    Hypoxic respiratory failure--intubated/sedated/ on vent    NSTEMI--trop , cpk and MB have peaked and are now going down.     Probable pneumonia  Plan:     Options very limited  Continue supportive rx  ASA, lovenox , beta blockers  Continue vent support  ?family conference     Norleen MICAEL Gelineau, MD      Electronically signed by Gelineau Norleen ORN, MD at 05/06/2009  9:24 AM EDT

## 2009-05-06 NOTE — Progress Notes (Signed)
Attempted to visit patient for initial spiritual assessment.  Patient is intubated.  No family present.  Offered silent prayer at bedside and left card informing of availability of pastoral care.     Visit by Delma Post, MA, Chaplain

## 2009-05-06 NOTE — Progress Notes (Signed)
PULMONARY/Critical Care/SLEEP MEDICINE ??? [facility]  Name: Breanna Lester   DOB: May 26, 1961   MRN: [id number]   Date: 05/06/2009 10:59 AM   [x]     I have reviewed the flowsheet and previous day???s notes.  [x]    The patient is unable to give any meaningful history or review of systems because the patient is:  [x]    Intubated []       [x]    Sedated    []    Unresponsive      [x]    The patient is critically ill on      [x]    Mechanical ventilation []    Pressors   []    BiPAP []                     BMW:UXLKGM of systems not obtained due to patient factors.    Vital Signs:    BP 89/42   Pulse 74   Temp 99.2 ??F (37.3 ??C)   Resp 25   Ht 5\' 2"  (1.575 m)   Wt 144 lb 13.5 oz (65.7 kg)   SpO2 100%   PF 65 L/min    O2 Device: Endotracheal tube   O2 Flow Rate (L/min): 2 l/min   Temp (24hrs), Avg:98.9 ??F (37.2 ??C), Min:97.8 ??F (36.6 ??C), Max:99.5 ??F (37.5 ??C)       Intake/Output:   Last shift:      In: 512.2 (512.2 I.V.)  Out: 115 (115 Urine)    Last 3 shifts: In: 6170.6 (5220.6 I.V.)  Out: 2139 (1569 Urine)      Intake/Output Summary (Last 24 hours) at 05/06/09 1059  Last data filed at 05/06/09 1000   Gross per 24 hour   Intake 4117.05 ml   Output   1330 ml   Net 2787.05 ml       Hemodynamics:   PAP:     Wedge:     CVP:      CO:     CI:     SVR:     PVR:        Ventilator Settings:  Ventilator  Mode: Assist control  Respiratory Rate  Resp Rate Set: 10   Resp Rate Observed: 27   Insp Flow (l/min): 65 l/sec  I:E Ratio: 1:1.6  Ventilator Volumes  Vt Set (ml): 500 ml  Vt Observed (ml): 591 ml  Ve Observed (l/min): 15.7 l/min  Ventilator Pressures  PIP Observed (cm H2O): 26 cm H2O  Plateau Pressure (cm H2O): 26 cm H2O  MAP (cm H2O): 13   PEEP/CPAP (cm H2O): 5 cm H20    Physical Exam:    General: [x]    Intubated/sedated []    No acute distress []        [x]    Ill appearing []     []           HEENT: []    Icteric [x]    PERRL []        [x]    Anicteric Mucosa:[x]    moist   []    dry []           Resp: []    Wheeze []    Clear to ascultation bilaterally []    Accessory muscle use    [x]    Rales []    Chest tube:  []    Air leak []        []    Ronchi []    Crepitus []       [x]    Coarse bilateral ventilator breath sounds  CV: [x]    Regular []    Tachycardia []        []    Irregular []    Bradycardic []        []    Murmur []    S3 []        []    Edema []    S4 []          GI: [x]    Soft  []    NG Tube Bowel sounds: []    present [x]    absent    []    Firm []    PEG []        []    Distended  []          GU: [x]    Foley []    Hematuria []        [x]    Clear urine []    Edema []          MSK:    [x]    SCD???s []    Edema []        [x]    No deformities []     []          Skin: [x]    Warm [x]    Dry  []        []    Cool []    Moist []        []    Hot  []    Diaphoretic []        []    Rash  []    Cyanosis []          N-psych: [x]    Sedated  []    Agitated []        []    Alert  Follows commands:   []    Yes  [x]    No []          Devices: [x]    ET-Tube []    Tracheostomy []    Chest tube: Air leak? []    Y/[]    N    []    Central Line []    PA Catheter []        []    PICC []     []          DATA:   Current facility-administered medications   Medication Dose Route Frequency   ??? levothyroxine (SYNTHROID) tablet 50 mcg  50 mcg Oral ACB   ??? propofol (DIPRIVAN) 10 mg/mL injection        ??? lactulose (CHRONULAC) solution 20 g  20 g Oral BID   ??? aspirin (ASPIRIN) tablet 325 mg  325 mg Oral DAILY   ??? potassium chloride 20 mEq in 50 ml IVPB   20 mEq IntraVENous Q1H   ??? dextrose 5% with KCl 20 mEq/L infusion    IntraVENous CONTINUOUS   ??? albumin human 5% (BUMINATE) solution 25 g  25 g IntraVENous Q4H PRN   ??? vancomycin (VANCOCIN) 1 g in 0.9% sodium chloride 250 mL IVPB  1,000 mg IntraVENous Q24H   ??? metoprolol tartrate (LOPRESSOR) injection 2.5 mg  2.5 mg IntraVENous Q6H   ??? propofol (DIPRIVAN) infusion  5-50 mcg/kg/min IntraVENous TITRATE   ??? morphine injection 1 mg  1 mg IntraVENous PRN   ??? pantoprazole (PROTONIX) injection 40 mg  40 mg IntraVENous DAILY    ??? levofloxacin (LEVAQUIN) 750 mg infusion   750 mg IntraVENous Q48H   ??? piperacillin-tazobactam (ZOSYN) 2.25 g in 0.9% sodium chloride (MBP/ADV) 50 mL MBP  2.25 g IntraVENous Q8H   ??? albuterol/ipratropium (DUONEB) neb solution   1 Dose Nebulization Q4HWA RT   ??? midazolam (VERSED) injection 2-4 mg  2-4 mg IntraVENous Q1H  PRN   ??? lorazepam (ATIVAN) injection 1-2 mg  1-2 mg IntraVENous Q6H PRN   ??? sodium chloride (NS) flush 10 mL  10 mL InterCATHeter Q8H   ??? sodium chloride (NS) flush 10 mL  10 mL InterCATHeter PRN   ??? heparin (porcine) pf 300 Units  300 Units InterCATHeter PRN   ??? sodium chloride (NS) flush 20 mL  20 mL InterCATHeter PRN   ??? DISCONTD: vancomycin (VANCOCIN) 1.25 g in 0.9% sodium chloride 250 mL IVPB  1,250 mg IntraVENous Q24H   ??? DISCONTD: metoprolol tartrate (LOPRESSOR) injection 5 mg  5 mg IntraVENous Q6H   ??? DISCONTD: enoxaparin (LOVENOX) injection 60 mg  60 mg SubCUTAneous Q12H   ??? acetaminophen (TYLENOL) suppository 650 mg  650 mg Rectal Q6H PRN   ??? albuterol (PROVENTIL VENTOLIN) nebulizer solution 2.5 mg  2.5 mg Nebulization Q2H PRN   ??? thiamine (B-1) 100 mg in 0.9% sodium chloride 50 mL IVPB  100 mg IntraVENous DAILY   ??? nitroglycerin (NITROBID) 2 % ointment 2 Inch  2 Inch Topical Q6H PRN   ??? saline peripheral flush 5 mL  5 mL InterCATHeter PRN   ??? DISCONTD: SALINE PERIPHERAL FLUSH Q8H 5 mL  5 mL InterCATHeter Q8H         Telemetry: [x]    Sinus []    A-flutter []    Paced    []    A-fib []    Multiple PVC???s                  Labs:  Recent Labs   Basename 05/06/09 0445 05/05/09 0530 05/04/09 0325   ??? WBC 12.1* 12.0* 14.6*   ??? HGB 9.4* 10.0* 9.7*   ??? HCT 30.1* 32.5* 32.2*   ??? PLT 86* 94* 118*       Recent Labs   Basename 05/06/09 0445 05/05/09 0530 05/04/09 0325   ??? NA 156* 159* 148*   ??? K 3.5 3.1* 3.5   ??? CL 123* 125* 116*   ??? CO2 23 24 20*   ??? GLU 195* 165* 483*   ??? BUN 51* 44* 26*   ??? CREA 1.5* 1.8* 1.5*   ??? CA 8.8 8.2* 8.3*   ??? MG -- 2.6* 2.2   ??? PHOS -- 3.0 --   ??? ALB 2.5* 2.4* --    ??? TBIL 4.4* 4.5* --   ??? SGOT 101* 170* --   ??? INR -- 3.0* --       Recent Labs   Basename 05/06/09 0403 05/05/09 0409 05/04/09 0815   ??? PH 7.41 7.46* 7.47*   ??? PCO2 33* 31* 31*   ??? PO2 104* 72* 507*   ??? HCO3 21* 21* 22   ??? FIO2 50 50 100         Imaging:  [x]    I have personally reviewed the patient???s radiographs  []    Radiographs reviewed with radiologist   [x]    No change from prior, tubes and lines in adequate position  []    Improved   []    Worsening     IMPRESSION:     The patient is: [x]    acutely ill Risk of deterioration: []    moderate    [x]    critically ill  [x]    high     Patient Active Hospital Problem List:  Nausea with vomiting (05/01/2009)    Hypokalemia (05/01/2009)    Hepatic encephalopathy (05/01/2009)    Gait abnormality (05/01/2009)    Alcoholic cirrhosis of liver (  05/01/2009)    Hypomagnesemia (05/01/2009)      ??     PLAN:  1. Adjust vent settings  2. Cont iv abx  3. Cont ivf with d5w  4. Jet nebs  5. PUD/DVT prophylaxis  6. DC sedation     [x]    See my orders for details    My assessment/plan was discussed with:  [x]    nursing []    PT/OT    []    respiratory therapy []    Dr.   []    family []         []    Total critical care time exclusive of procedures       minutes  Vickki Muff, MD

## 2009-05-06 NOTE — Progress Notes (Signed)
Ortho-    Placed well-molded cast on L upper extremity.  Will order x-rays to check alignment.    Karie Georges. Janann August, MD

## 2009-05-06 NOTE — Other (Signed)
Cardiac Multidisciplinary Rounds   05/06/2009           Patient Information:  NAME:   Breanna Lester             AGE:   48 y.o.        ADMISSION DATE:   04/29/2009              ADMITTING PHYSICIAN:   Wilford Corner., MD         PRIMARY CARE PHYSICIAN:     Mencias              *CARDIOLOGIST:     Juanetta Gosling          ________________________________________________________________________  ________________________________________________________________________         Primary Cardiac Diagnosis:          []        CHF    []        Arrhythmia     [x]        MI              []        Chest Pain            []       Cardiac Surgery           Problem List: (Address Problems Pertinent to This Admission Only)      AMS, hypokalemia, lft upper extremity pain  AMS, hypokalemia, lft upper extremity pain  AMS, hypokalemia, lft upper extremity pain            Problem List Never Reviewed      Class    Nausea with vomiting [787.01]         Hypokalemia [276.8A]         Hepatic encephalopathy [572.2]         Gait abnormality [781.2N]         Alcoholic cirrhosis of liver [571.2]         Hypomagnesemia [275.2B]                       __________________________________________________________________   __________________________________________________________________             Cardiac Surgery or Procedure:   none  Date:          none  Pacemaker:  []      AICD:  []      BIVICD: []            ___________________________________________________________________  ___________________________________________________________________        EF:   55-60      %        ___________________________________________________________________  ___________________________________________________________________      MEDICATIONS :     (include dosage with patients medication)        *ACE/ARB if EF <40%:  No                                        If none, has MD documented why?        *Beta Blocker :    2.5 Metoprolol every 6 hours                             If none, has MD documented why?       Diuretic :   nopne     Anticoagulant -  Heparin  -  Dose :                    PTT :          Lovenox  -   Dose:  60 every 12 hours         Coumadin  Y []     N [x]                  INR : 3.0         9-15     Plavix:       ASA :   325mg   daily    *ASA on arrival ( if AMI ) :    Not AMI                                       If AMI and none given,has MD documented why?        Statin :        If AMI and none ordered, has MD documented why?        Cardiac Surgery Patients:     Insulin Protocol (glucose average <120 surgery to POD 3):            ___________________________________________________________________  ___________________________________________________________________            Vaccination Information:      There is no immunization history on file for this patient.         a) PNA:    b) FLU:           If patient declines, is it documented? []    Yes            [x]    No         ___________________________________________________________________  ___________________________________________________________________                             Lines & Drains      a) Central Line:  [x]         Placement Date:  Potassium IVF and Sedation and medications while vented          Clinical reason for continuing PICC :                    b) Foley Catheter:   []          Placement Date:   04-29-09       Clinical reason for continuing foley :   Prevent further skin breakdown from incontinence while intubated and monitor output?        Cardiac Surgery Pts: foley d/c'd by POD 2?  []    Yes  []    No        ______________________________________________________________________  ______________________________________________________________________             Activity Level:     Bedrest         DVT Prophylaxis (if indicated): Lovenox      [x]    Yes      []    No       ______________________________________________________________________   ______________________________________________________________________         Discharge Planning:      a) Pt Admitted From:   Home       b) Discharge to:      []   Home      []   Rehab      []   [facility]      []    Home with Home Health      c) Support system- no family seen in past few days     ______________________________________________________________________  ______________________________________________________________________         Consults:    a) Cardiac Rehab  []          b) Dietary   []         c) Care Management [x]          d) Palliative Care Consult   []        ( Consider for communication of goals of care, assistance with impaired family dynamics, poor support system, symptom management assoc with chronic illness such as pain, anxiety, GI symptoms)      e) Other :          ______________________________________________________________________  ______________________________________________________________________         Care Plan/Pathway:   (which ones are initiated and updated?)          CHF []         AMI  []         CABG  []         Cardiac Surgery Valve []         Cath Lab procedure []         Other:      Patient no on specific pathyway related to cardiac issues                History of Smoking within the past year?:  No quit 02/2008                 []    Yes      [x]    No       Smoking Cessation Education Provided and documented:      []    Yes      [x]    No          ______________________________________________________________________          Patient / Family Goals For Today: Comfort, Family education on patient condition and medications

## 2009-05-06 NOTE — Progress Notes (Signed)
Neurology Progress Note    Patient ID:  Breanna Lester  [id number]  48 y.o.  09-07-1960    Subjective:      Patient none..    Current facility-administered medications   Medication Dose Route Frequency   ??? levothyroxine (SYNTHROID) tablet 50 mcg  50 mcg Oral ACB   ??? lactulose (CHRONULAC) solution 20 g  20 g Oral BID   ??? aspirin (ASPIRIN) tablet 325 mg  325 mg Oral DAILY   ??? dextrose 5% with KCl 20 mEq/L infusion    IntraVENous CONTINUOUS   ??? albumin human 5% (BUMINATE) solution 25 g  25 g IntraVENous Q4H PRN   ??? vancomycin (VANCOCIN) 1 g in 0.9% sodium chloride 250 mL IVPB  1,000 mg IntraVENous Q24H   ??? metoprolol tartrate (LOPRESSOR) injection 2.5 mg  2.5 mg IntraVENous Q6H   ??? morphine injection 1 mg  1 mg IntraVENous PRN   ??? pantoprazole (PROTONIX) injection 40 mg  40 mg IntraVENous DAILY   ??? levofloxacin (LEVAQUIN) 750 mg infusion   750 mg IntraVENous Q48H   ??? piperacillin-tazobactam (ZOSYN) 2.25 g in 0.9% sodium chloride (MBP/ADV) 50 mL MBP  2.25 g IntraVENous Q8H   ??? albuterol/ipratropium (DUONEB) neb solution   1 Dose Nebulization Q4HWA RT   ??? midazolam (VERSED) injection 2-4 mg  2-4 mg IntraVENous Q1H PRN   ??? sodium chloride (NS) flush 10 mL  10 mL InterCATHeter Q8H   ??? sodium chloride (NS) flush 10 mL  10 mL InterCATHeter PRN   ??? heparin (porcine) pf 300 Units  300 Units InterCATHeter PRN   ??? sodium chloride (NS) flush 20 mL  20 mL InterCATHeter PRN   ??? DISCONTD: propofol (DIPRIVAN) infusion  5-50 mcg/kg/min IntraVENous TITRATE   ??? DISCONTD: enoxaparin (LOVENOX) injection 60 mg  60 mg SubCUTAneous Q12H   ??? DISCONTD: lorazepam (ATIVAN) injection 1-2 mg  1-2 mg IntraVENous Q6H PRN   ??? acetaminophen (TYLENOL) suppository 650 mg  650 mg Rectal Q6H PRN   ??? albuterol (PROVENTIL VENTOLIN) nebulizer solution 2.5 mg  2.5 mg Nebulization Q2H PRN   ??? thiamine (B-1) 100 mg in 0.9% sodium chloride 50 mL IVPB  100 mg IntraVENous DAILY   ??? nitroglycerin (NITROBID) 2 % ointment 2 Inch  2 Inch Topical Q6H PRN    ??? saline peripheral flush 5 mL  5 mL InterCATHeter PRN          Review of Systems:    Review of systems not obtained due to patient factors.    Objective:     Patient Vitals in the past 8 hrs:   BP Temp Pulse Resp SpO2   05/06/09 1946 100/45 mmHg - 82  25  99 %   05/06/09 1800 91/43 mmHg - 79  24  98 %   05/06/09 1730 102/52 mmHg 99.5 ??F (37.5 ??C) 79  26  98 %   05/06/09 1700 96/46 mmHg - 73  24  99 %   05/06/09 1600 93/45 mmHg 98.3 ??F (36.8 ??C) 69  23  100 %   05/06/09 1522 - - 68  - 100 %   05/06/09 1500 102/53 mmHg - 72  30  100 %   05/06/09 1400 114/52 mmHg - 77  26  100 %   05/06/09 1347 - - 79  - 99 %   05/06/09 1300 117/58 mmHg - 86  34  99 %   05/06/09 1200 112/56 mmHg 99.1 ??F (37.3 ??C) 77  31  99 %  In: 6050.6 (5250.6 I.V.)  Out: 2211 (1831 Urine)      Lab Review Recent Results (from the past 24 hour(s))   BLOOD GAS, ARTERIAL    Collection Time    05/06/09  4:03 AM   Component Value Range   ??? pH 7.41  7.35 - 7.45 ( )   ??? PCO2 33 (*) 35 - 45 (mmHg)   ??? PO2 104 (*) 80 - 100 (mmHg)   ??? O2 SAT 98 (*) 92 - 97 (%)   ??? BICARBONATE 21 (*) 22 - 26 (mmol/L)   ??? BASE DEFICIT 2.9 (*) BE NORMAL RANGE -3 T (mmol/L)   ??? FIO2 50  (%)   ??? SITE RR     ??? MODE A/C     ??? SET RATE 10  ( )   ??? VT/PIP 500  ( )   ??? PEEP/CPAP 5  ( )   ??? SAMPLE SOURCE ARTERIAL     ??? O2 METHOD VENTILATOR     CBC WITH AUTOMATED DIFF    Collection Time    05/06/09  4:45 AM   Component Value Range   ??? WBC 12.1 (*) 3.6 - 11.0 (K/uL)   ??? RBC 3.45 (*) 3.80 - 5.20 (M/uL)   ??? HGB 9.4 (*) 11.5 - 16.0 (g/dL)   ??? HCT 30.1 (*) 35.0 - 47.0 (%)   ??? MCV 87.2  80.0 - 99.0 (FL)   ??? MCH 27.2  26.0 - 34.0 (PG)   ??? MCHC 31.2  30.0 - 36.5 (g/dL)   ??? RDW 19.5 (*) 11.5 - 14.5 (%)   ??? PLATELET 86 (*) 150 - 400 (K/uL)   ??? NEUTROPHILS 81 (*) 32 - 75 (%)   ??? LYMPHOCYTES 10 (*) 12 - 49 (%)   ??? MONOCYTES 7  5 - 13 (%)   ??? EOSINOPHILS 2  0 - 7 (%)   ??? BASOPHILS 0  0 - 1 (%)   ??? ABSOLUTE NEUTS 9.9 (*) 1.8 - 8.0 (K/UL)   ??? ABSOLUTE LYMPHS 1.2  0.8 - 3.5 (K/UL)    ??? ABSOLUTE MONOS 0.8  0.0 - 1.0 (K/UL)   ??? ABSOLUTE EOSINS 0.3  0.0 - 0.4 (K/UL)   ??? ABSOLUTE BASOS 0.0  0.0 - 0.1 (K/UL)   METABOLIC PANEL, COMPREHENSIVE    Collection Time    05/06/09  4:45 AM   Component Value Range   ??? Sodium 156 (*) 136 - 145 (MMOL/L)   ??? Potassium 3.5  3.5 - 5.1 (MMOL/L)   ??? Chloride 123 (*) 97 - 108 (MMOL/L)   ??? CO2 23  21 - 32 (MMOL/L)   ??? Anion gap 10  5 - 15 (mmol/L)   ??? Glucose 195 (*) 65 - 100 (MG/DL)   ??? BUN 51 (*) 6 - 20 (MG/DL)   ??? Creatinine 1.5 (*) 0.6 - 1.3 (MG/DL)   ??? BUN/Creatinine ratio 34 (*) 12 - 20 ( )   ??? GFR est AA 48 (*) >60 (ml/min/1.77m2)   ??? GFR est non-AA 39 (*) >60 (ml/min/1.67m2)   ??? Calcium 8.8  8.5 - 10.1 (MG/DL)   ??? Bilirubin, total 4.4 (*) 0.2 - 1.0 (MG/DL)   ??? ALT 101 (*) 12 - 78 (U/L)   ??? AST 101 (*) 15 - 37 (U/L)   ??? Alk. phosphatase 129  50 - 136 (U/L)   ??? Protein, total 5.9 (*) 6.4 - 8.2 (g/dL)   ??? Albumin 2.5 (*) 3.5 - 5.0 (g/dL)   ???  Globulin 3.4  2.0 - 4.0 (g/dL)   ??? A-G Ratio 0.7 (*) 1.1 - 2.2 ( )   CK    Collection Time    05/06/09  4:45 AM   Component Value Range   ??? CK 954 (*) 21 - 215 (U/L)   AMMONIA    Collection Time    05/06/09  4:45 AM   Component Value Range   ??? Ammonia 44 (*) <32 (UMOL/L)         Additional comments:I reviewed the patient's other test results. Labs and EEG      NEUROLOGICAL EXAM:    Appearance:  The patient is well developed, well nourished, comatose   Mental Status: Comatose and intubated   Cranial Nerves:   untestable visual fields. Fundi are benign. PERLA, EOM's full, no nystagmus, no ptosis. Facial sensation is normal. Corneal reflexes are intact. Facial movement is symmetric.    Motor:  Quadriparetic due to sedation. Normal bulk and tone. No fasciculations.   Reflexes:   Deep tendon reflexes 1+/4 and symmetrical.   Sensory:   Withdraws poorly to pain   Gait:  untestable gait.   Tremor:   No tremor noted.   Cerebellar:  No cerebellar signs present.    Neurovascular:  Normal heart sounds and regular rhythm, peripheral pulses intact, and no carotid bruits.         Assessment:     Patient Active Hospital Problem List:  Nausea with vomiting (05/01/2009)    Hypokalemia (05/01/2009)    Hepatic encephalopathy (05/01/2009)    Gait abnormality (05/01/2009)    Alcoholic cirrhosis of liver (05/01/2009)    Hypomagnesemia (05/01/2009)      Plan:   Encephalopathy, hepatic with sedation  Not much to do now neurologiclly, no seizures  EEG showed sleep only      Signed:  Omolara Carol A. Katrinka Blazing, MD  05/06/2009  7:56 PM

## 2009-05-06 NOTE — Progress Notes (Addendum)
Hospitalist Progress Note         NAME: Breanna Lester        DOB:  [birth year]        MRN:  [id number]      Assessment & Plan   Sepsis syndrome :underlying pneumonia, fever, leukocytosis  ????????????????????????Broad spectrum antibiotics.  ????????????????????????No evidence of UTI or ascites(SBP), has an umbilical hernia that is not     reducible but overall adbomen is soft.    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    Left wrist Fracture: casted as per Ortho ORIF when stable    Hypothyroidism: although very low TSH, rest of panel WNL, start L-Thyroxine       Subjective:     48yo caucasian female    Chief Complaint:  In ICU intubated with acute respiratory failure     Discussed with RN events overnight.     Review of Systems:  Fever/chills y   Cough N   Sputum    N   SOB/DOE  N   Chest Pain N   Abdominal Pain N   Diarrhea N   Constipation N   Nausea/Vomiting N   Dysuria N   Tolerating PT N   Tolerating Diet N   Could not obtain due to AMS vs intubation xx        Objective:    Patient intubated this AM with acute respiratory failure, febrile, tachycardic, mild hypotension, patient critically ill with bad prognosis.    VITALS:   Last 24hrs VS reviewed since prior progress note. Most recent are:  Visit Vitals   Item Reading   ??? BP 90/45   ??? Pulse 77   ??? Temp 99.5 ??F (37.5 ??C)   ??? Resp 26   ??? Ht 5\' 2"  (1.575 m)   ??? Wt 144 lb 13.5 oz (65.7 kg)   ??? SpO2 100%   ??? PF 65L/min           Intake/Output Summary (Last 24 hours) at 05/06/09 0745  Last data filed at 05/06/09 0700   Gross per 24 hour   Intake 4250.05 ml   Output   1246 ml   Net 3004.05 ml        Telemetry Reviewed:     PHYSICAL EXAM:  General: Intubated, sedated????  HEENT: Pupils unequal, R 3mm, L 2mm  Lungs:  Bilateral rales, ronchi and mild wheezing.  Heart:  Regular rhythm, tachycardic, SEM  Abdomen: Soft, Non distended, hypoactive BS, no rebound, umbilical hernia non reducible  Extremities: No edema, pulses present, brace in left arm  Neurologic:?? GCS [id number], sedated  Psych:???? sedated    Lab Data Reviewed: (see  below)    Medications Reviewed: (see below)    PMH/SH reviewed - no change compared to H&P  ______________________________________________________________________  Total time spent with patient: 45 minutes     Critical Care Provided     Minutes non procedure based    Care Plan discussed with:  Patient    Family    RN x   Care Manager    Consultant/Specialist      >50% of visit spent in counseling and coordination of care       Prophylaxis:  GI PPI   DVT SCD     Disposition:   Home with Family    HH/PT/OT/RN    SNF/LTC    [facility]      ______________________________________________________________________  Attending Physician: Hazeline Junker, MD   _____________________________________________________________________________________________________  Procedures: see electronic medical records for all procedures/Xrays and details  which were not copied into this note but were reviewed prior to creation of Plan.      LABS:   Recent Labs   Basename 05/06/09 0445 05/05/09 0530   ??? WBC 12.1* 12.0*   ??? HGB 9.4* 10.0*   ??? HCT 30.1* 32.5*   ??? PLT 86* 94*         Recent Labs   Basename 05/06/09 0445 05/05/09 0530 05/04/09 0325   ??? NA 156* 159* 148*   ??? K 3.5 3.1* 3.5   ??? CL 123* 125* 116*   ??? CO2 23 24 20*   ??? BUN 51* 44* 26*   ??? CREA 1.5* 1.8* 1.5*   ??? GLU 195* 165* 483*   ??? CA 8.8 8.2* 8.3*   ??? MG -- 2.6* 2.2   ??? PHOS -- 3.0 --   ??? URICA -- -- --         Recent Labs   Basename 05/06/09 0445 05/05/09 0530   ??? SGOT 101* 170*   ??? GPT 101* 108*   ??? AP 129 144*   ??? TBIL 4.4* 4.5*   ??? TP 5.9* 6.0*   ??? ALB 2.5* 2.4*   ??? GLOB 3.4 3.6   ??? GGT -- --   ??? AML -- --   ??? LPSE -- --         Recent Labs   Basename 05/05/09 0530   ??? INR 3.0*   ??? PTP 28.9*   ??? APTT --          No results found for this basename: FE:2,TIBC:2,PSAT:2,FERR:2, in the last 72 hours   Recent Labs   Basename 05/06/09 0403 05/05/09 0409   ??? PH 7.41 7.46*   ??? PCO2 33* 31*   ??? PO2 104* 72*         Recent Labs   Basename 05/06/09 0445 05/05/09 0530 05/04/09 1650   ??? CPK 954* 1388* 2222*   ??? CKMB -- -- --         Lab Results   Component Value Date/Time    POC GLUCOSE 114 05/01/2009  4:40 PM    POC GLUCOSE 162 05/01/2009 12:02 PM    POC GLUCOSE 115 05/01/2009  8:39 AM         Lab Results   Component Value Date/Time    Color YELLOW 04/30/2009 12:14 AM    Appearance CLEAR 04/30/2009 12:14 AM    Specific gravity 1.010 10/29/2008 11:00 AM    Specific gravity 1.011 04/30/2009 12:14 AM    pH 6.0 04/30/2009 12:14 AM    Protein NEGATIVE  04/30/2009 12:14  AM    Glucose NEGATIVE  04/30/2009 12:14 AM    Ketone NEGATIVE  04/30/2009 12:14 AM    Bilirubin NEGATIVE  04/30/2009 12:14 AM    Urobilinogen 2.0 04/30/2009 12:14 AM    Nitrites NEGATIVE  04/30/2009 12:14 AM    LeukocyteEsterase TRACE 04/30/2009 12:14 AM    Epithelial cells 10-20 04/30/2009 12:14 AM    Bacteria NEGATIVE  04/30/2009 12:14 AM    WBC 0-4 04/30/2009 12:14 AM    RBC 0-3 04/30/2009 12:14 AM           MEDICATIONS:  Current facility-administered medications    Medication Dose Route Frequency   ??? levothyroxine (SYNTHROID) tablet 50 mcg  50 mcg Oral ACB   ??? propofol (DIPRIVAN) 10 mg/mL injection        ??? sodium chloride 0.9 % bolus infusion 500 mL  500 mL IntraVENous ONCE   ??? lactulose (CHRONULAC) solution 20 g  20 g Oral BID   ??? aspirin (ASPIRIN) tablet 325 mg  325 mg Oral DAILY   ??? potassium chloride 20 mEq in 50 ml IVPB   20 mEq IntraVENous Q1H   ??? dextrose 5% with KCl 20 mEq/L infusion    IntraVENous CONTINUOUS   ??? albumin human 5% (BUMINATE) solution 25 g  25 g IntraVENous Q4H PRN   ??? vancomycin (VANCOCIN) 1 g in 0.9% sodium chloride 250 mL IVPB  1,000 mg IntraVENous Q24H   ??? metoprolol tartrate (LOPRESSOR) injection 2.5 mg  2.5 mg IntraVENous Q6H   ??? DISCONTD: dextrose 5% - 0.45% NaCl with KCl 20 mEq/L infusion    IntraVENous CONTINUOUS   ??? propofol (DIPRIVAN) infusion  5-50 mcg/kg/min IntraVENous TITRATE   ??? morphine injection 1 mg  1 mg IntraVENous PRN   ??? pantoprazole (PROTONIX) injection 40 mg  40 mg IntraVENous DAILY   ??? levofloxacin (LEVAQUIN) 750 mg infusion   750 mg IntraVENous Q48H   ??? piperacillin-tazobactam (ZOSYN) 2.25 g in 0.9% sodium chloride (MBP/ADV) 50 mL MBP  2.25 g IntraVENous Q8H   ??? albuterol/ipratropium (DUONEB) neb solution   1 Dose Nebulization Q4HWA RT   ??? midazolam (VERSED) injection 2-4 mg  2-4 mg IntraVENous Q1H PRN   ??? enoxaparin (LOVENOX) injection 60 mg  60 mg SubCUTAneous Q12H   ??? lorazepam (ATIVAN) injection 1-2 mg  1-2 mg IntraVENous Q6H PRN   ??? sodium chloride (NS) flush 10 mL  10 mL InterCATHeter Q8H   ??? sodium chloride (NS) flush 10 mL  10 mL InterCATHeter PRN   ??? heparin (porcine) pf 300 Units  300 Units InterCATHeter PRN   ??? sodium chloride (NS) flush 20 mL  20 mL InterCATHeter PRN   ??? propofol (DIPRIVAN) 10 mg/mL injection        ??? DISCONTD: vancomycin (VANCOCIN) 1.25 g in 0.9% sodium chloride 250 mL IVPB  1,250 mg IntraVENous Q24H   ??? DISCONTD: metoprolol tartrate (LOPRESSOR) injection 5 mg  5 mg IntraVENous Q6H    ??? acetaminophen (TYLENOL) suppository 650 mg  650 mg Rectal Q6H PRN   ??? albuterol (PROVENTIL VENTOLIN) nebulizer solution 2.5 mg  2.5 mg Nebulization Q2H PRN   ??? thiamine (B-1) 100 mg in 0.9% sodium chloride 50 mL IVPB  100 mg IntraVENous DAILY   ??? nitroglycerin (NITROBID) 2 % ointment 2 Inch  2 Inch Topical Q6H PRN   ??? saline peripheral flush 5 mL  5 mL InterCATHeter PRN   ??? DISCONTD: SALINE PERIPHERAL FLUSH Q8H 5 mL  5 mL InterCATHeter Q8H

## 2009-05-06 NOTE — Progress Notes (Signed)
Verbal shift change report given to oncoming nurse.

## 2009-05-06 NOTE — Progress Notes (Signed)
Dr. Jayme Cloud called and notified of patients condition.  Patient tachypnic, air hungry (gasping for air)  Orders received to place back on previous setting rate.  Close observation maintained.

## 2009-05-06 NOTE — Progress Notes (Signed)
Cardiology Progress Note      05/06/2009 9:20 AM    Admit Date: 04/29/2009      Subjective:     Remains critically ill--intubated, sedated, on vent.    BP 98/49   Pulse 79   Temp 99.3 ??F (37.4 ??C)   Resp 19   Ht 5\' 2"  (1.575 m)   Wt 144 lb 13.5 oz (65.7 kg)   SpO2 100%   PF 65 L/min  Current facility-administered medications   Medication Dose Route Frequency   ??? levothyroxine (SYNTHROID) tablet 50 mcg  50 mcg Oral ACB   ??? propofol (DIPRIVAN) 10 mg/mL injection        ??? lactulose (CHRONULAC) solution 20 g  20 g Oral BID   ??? aspirin (ASPIRIN) tablet 325 mg  325 mg Oral DAILY   ??? potassium chloride 20 mEq in 50 ml IVPB   20 mEq IntraVENous Q1H   ??? dextrose 5% with KCl 20 mEq/L infusion    IntraVENous CONTINUOUS   ??? albumin human 5% (BUMINATE) solution 25 g  25 g IntraVENous Q4H PRN   ??? vancomycin (VANCOCIN) 1 g in 0.9% sodium chloride 250 mL IVPB  1,000 mg IntraVENous Q24H   ??? metoprolol tartrate (LOPRESSOR) injection 2.5 mg  2.5 mg IntraVENous Q6H   ??? DISCONTD: dextrose 5% - 0.45% NaCl with KCl 20 mEq/L infusion    IntraVENous CONTINUOUS   ??? propofol (DIPRIVAN) infusion  5-50 mcg/kg/min IntraVENous TITRATE   ??? morphine injection 1 mg  1 mg IntraVENous PRN   ??? pantoprazole (PROTONIX) injection 40 mg  40 mg IntraVENous DAILY   ??? levofloxacin (LEVAQUIN) 750 mg infusion   750 mg IntraVENous Q48H   ??? piperacillin-tazobactam (ZOSYN) 2.25 g in 0.9% sodium chloride (MBP/ADV) 50 mL MBP  2.25 g IntraVENous Q8H   ??? albuterol/ipratropium (DUONEB) neb solution   1 Dose Nebulization Q4HWA RT   ??? midazolam (VERSED) injection 2-4 mg  2-4 mg IntraVENous Q1H PRN   ??? lorazepam (ATIVAN) injection 1-2 mg  1-2 mg IntraVENous Q6H PRN   ??? sodium chloride (NS) flush 10 mL  10 mL InterCATHeter Q8H   ??? sodium chloride (NS) flush 10 mL  10 mL InterCATHeter PRN   ??? heparin (porcine) pf 300 Units  300 Units InterCATHeter PRN   ??? sodium chloride (NS) flush 20 mL  20 mL InterCATHeter PRN    ??? DISCONTD: vancomycin (VANCOCIN) 1.25 g in 0.9% sodium chloride 250 mL IVPB  1,250 mg IntraVENous Q24H   ??? DISCONTD: metoprolol tartrate (LOPRESSOR) injection 5 mg  5 mg IntraVENous Q6H   ??? DISCONTD: enoxaparin (LOVENOX) injection 60 mg  60 mg SubCUTAneous Q12H   ??? acetaminophen (TYLENOL) suppository 650 mg  650 mg Rectal Q6H PRN   ??? albuterol (PROVENTIL VENTOLIN) nebulizer solution 2.5 mg  2.5 mg Nebulization Q2H PRN   ??? thiamine (B-1) 100 mg in 0.9% sodium chloride 50 mL IVPB  100 mg IntraVENous DAILY   ??? nitroglycerin (NITROBID) 2 % ointment 2 Inch  2 Inch Topical Q6H PRN   ??? saline peripheral flush 5 mL  5 mL InterCATHeter PRN   ??? DISCONTD: SALINE PERIPHERAL FLUSH Q8H 5 mL  5 mL InterCATHeter Q8H           Objective:      Physical Exam:  Intubated, sedated on vent  Chest--clear  CV--sl irreg S1S2 II/VI sem  Abd--benign  Ext--unchanged    Data Review:   Labs:  Recent Results (from the past 24  hour(s))   BLOOD GAS, ARTERIAL    Collection Time    05/06/09  4:03 AM   Component Value Range   ??? pH 7.41  7.35 - 7.45 ( )   ??? PCO2 33 (*) 35 - 45 (mmHg)   ??? PO2 104 (*) 80 - 100 (mmHg)   ??? O2 SAT 98 (*) 92 - 97 (%)   ??? BICARBONATE 21 (*) 22 - 26 (mmol/L)   ??? BASE DEFICIT 2.9 (*) BE NORMAL RANGE -3 T (mmol/L)   ??? FIO2 50  (%)   ??? SITE RR     ??? MODE A/C     ??? SET RATE 10  ( )   ??? VT/PIP 500  ( )   ??? PEEP/CPAP 5  ( )   ??? SAMPLE SOURCE ARTERIAL     ??? O2 METHOD VENTILATOR     CBC WITH AUTOMATED DIFF    Collection Time    05/06/09  4:45 AM   Component Value Range   ??? WBC 12.1 (*) 3.6 - 11.0 (K/uL)   ??? RBC 3.45 (*) 3.80 - 5.20 (M/uL)   ??? HGB 9.4 (*) 11.5 - 16.0 (g/dL)   ??? HCT 30.1 (*) 35.0 - 47.0 (%)   ??? MCV 87.2  80.0 - 99.0 (FL)   ??? MCH 27.2  26.0 - 34.0 (PG)   ??? MCHC 31.2  30.0 - 36.5 (g/dL)   ??? RDW 19.5 (*) 11.5 - 14.5 (%)   ??? PLATELET 86 (*) 150 - 400 (K/uL)   ??? NEUTROPHILS 81 (*) 32 - 75 (%)   ??? LYMPHOCYTES 10 (*) 12 - 49 (%)   ??? MONOCYTES 7  5 - 13 (%)   ??? EOSINOPHILS 2  0 - 7 (%)   ??? BASOPHILS 0  0 - 1 (%)    ??? ABSOLUTE NEUTS 9.9 (*) 1.8 - 8.0 (K/UL)   ??? ABSOLUTE LYMPHS 1.2  0.8 - 3.5 (K/UL)   ??? ABSOLUTE MONOS 0.8  0.0 - 1.0 (K/UL)   ??? ABSOLUTE EOSINS 0.3  0.0 - 0.4 (K/UL)   ??? ABSOLUTE BASOS 0.0  0.0 - 0.1 (K/UL)   METABOLIC PANEL, COMPREHENSIVE    Collection Time    05/06/09  4:45 AM   Component Value Range   ??? Sodium 156 (*) 136 - 145 (MMOL/L)   ??? Potassium 3.5  3.5 - 5.1 (MMOL/L)   ??? Chloride 123 (*) 97 - 108 (MMOL/L)   ??? CO2 23  21 - 32 (MMOL/L)   ??? Anion gap 10  5 - 15 (mmol/L)   ??? Glucose 195 (*) 65 - 100 (MG/DL)   ??? BUN 51 (*) 6 - 20 (MG/DL)   ??? Creatinine 1.5 (*) 0.6 - 1.3 (MG/DL)   ??? BUN/Creatinine ratio 34 (*) 12 - 20 ( )   ??? GFR est AA 48 (*) >60 (ml/min/1.58m2)   ??? GFR est non-AA 39 (*) >60 (ml/min/1.68m2)   ??? Calcium 8.8  8.5 - 10.1 (MG/DL)   ??? Bilirubin, total 4.4 (*) 0.2 - 1.0 (MG/DL)   ??? ALT 101 (*) 12 - 78 (U/L)   ??? AST 101 (*) 15 - 37 (U/L)   ??? Alk. phosphatase 129  50 - 136 (U/L)   ??? Protein, total 5.9 (*) 6.4 - 8.2 (g/dL)   ??? Albumin 2.5 (*) 3.5 - 5.0 (g/dL)   ??? Globulin 3.4  2.0 - 4.0 (g/dL)   ??? A-G Ratio 0.7 (*) 1.1 - 2.2 ( )  CK    Collection Time    05/06/09  4:45 AM   Component Value Range   ??? CK 954 (*) 21 - 215 (U/L)   AMMONIA    Collection Time    05/06/09  4:45 AM   Component Value Range   ??? Ammonia 44 (*) <32 (UMOL/L)         Telemetry: normal sinus rhythm      Assessment:     Patient Active Hospital Problem List:  Nausea with vomiting (05/01/2009)    Hypokalemia (05/01/2009)    Hepatic encephalopathy (05/01/2009)    Gait abnormality (05/01/2009)    Alcoholic cirrhosis of liver (05/01/2009)    Hypomagnesemia (05/01/2009)    Hypoxic respiratory failure--intubated/sedated/ on vent    NSTEMI--trop , cpk and MB have "peaked" and are now going down.??     Probable pneumonia  Plan:     Options very limited  Continue supportive rx  ASA, lovenox, beta blockers  Continue vent support  ?family conference       Ellwood Sayers, MD

## 2009-05-06 NOTE — Procedures (Signed)
Name: DONNITA, FARINA Admitted: 04/29/2009  MR #: [id number] DOB: [birth year]  Account #: [id number] Age 48  Ref MD: Location: [id number]       ELECTROENCEPHALOGRAM REPORT      DATE OF SERVICE: 05/05/2009    CLINICAL INDICATION FOR EEG: A patient with abnormal movements, jerks. A  patient with encephalopathy. A patient with altered mental status. A  patient with possible seizures. A patient with hepatic cirrhosis and  hepatic encephalopathy. For complete indications, please see dictated note  in the chart dated 05/04/2009.    EEG CLASSIFICATION: This patient is essentially normal asleep    DESCRIPTION OF RECORD: This recording contains nothing but sleep recorded  through the whole EEG. The patient had diffuse K complexes and sleep  spindles seen throughout the recording. No clear areas of focal slowing. No  spike or spike-and-wave discharges were seen. Stimulation of the patient  produced little change. Photic stimulation produced no change.  Hyperventilation was not performed. The patient is intubated.    INTERPRETATION: This is essentially a normal sleep electroencephalogram  with the patient continuously in a state of deep sleep due to her sedation.  No focal abnormities were identified. No spike or spike-and-wave discharges  were seen. The study will probably need to be repeated when the patient is  less sedated and more awake. Clinical correlation recommended.        Reviewed on 05/06/2009 7:13 AM            Youlanda Roys, MD    cc: Youlanda Roys, MD        TAS/wmx; D: 05/05/2009 9:36 P; T: 05/06/2009 2:00 A; DOC# [id number]; Job#  [id number]

## 2009-05-07 LAB — CBC WITH AUTOMATED DIFF
ABS. BASOPHILS: 0 10*3/uL (ref 0.0–0.1)
ABS. EOSINOPHILS: 0.3 10*3/uL (ref 0.0–0.4)
ABS. LYMPHOCYTES: 1.4 10*3/uL (ref 0.8–3.5)
ABS. MONOCYTES: 0.8 10*3/uL (ref 0.0–1.0)
ABS. NEUTROPHILS: 9.7 10*3/uL — ABNORMAL HIGH (ref 1.8–8.0)
BASOPHILS: 0 % (ref 0–1)
EOSINOPHILS: 3 % (ref 0–7)
HCT: 30.9 % — ABNORMAL LOW (ref 35.0–47.0)
HGB: 9.5 g/dL — ABNORMAL LOW (ref 11.5–16.0)
LYMPHOCYTES: 11 % — ABNORMAL LOW (ref 12–49)
MCH: 26.7 PG (ref 26.0–34.0)
MCHC: 30.7 g/dL (ref 30.0–36.5)
MCV: 86.8 FL (ref 80.0–99.0)
MONOCYTES: 7 % (ref 5–13)
NEUTROPHILS: 79 % — ABNORMAL HIGH (ref 32–75)
PLATELET: 74 10*3/uL — ABNORMAL LOW (ref 150–400)
RBC: 3.56 M/uL — ABNORMAL LOW (ref 3.80–5.20)
RDW: 19.5 % — ABNORMAL HIGH (ref 11.5–14.5)
WBC: 12.2 10*3/uL — ABNORMAL HIGH (ref 3.6–11.0)

## 2009-05-07 LAB — METABOLIC PANEL, COMPREHENSIVE
A-G Ratio: 0.7 — ABNORMAL LOW (ref 1.1–2.2)
ALT (SGPT): 99 U/L — ABNORMAL HIGH (ref 12–78)
AST (SGOT): 78 U/L — ABNORMAL HIGH (ref 15–37)
Albumin: 2.4 g/dL — ABNORMAL LOW (ref 3.5–5.0)
Alk. phosphatase: 125 U/L (ref 50–136)
Anion gap: 6 mmol/L (ref 5–15)
BUN/Creatinine ratio: 36 — ABNORMAL HIGH (ref 12–20)
BUN: 40 MG/DL — ABNORMAL HIGH (ref 6–20)
Bilirubin, total: 4.2 MG/DL — ABNORMAL HIGH (ref 0.2–1.0)
CO2: 24 MMOL/L (ref 21–32)
Calcium: 9.2 MG/DL (ref 8.5–10.1)
Chloride: 125 MMOL/L — ABNORMAL HIGH (ref 97–108)
Creatinine: 1.1 MG/DL (ref 0.6–1.3)
GFR est AA: >60 mL/min/{1.73_m2} (ref >60)
GFR est non-AA: 56 mL/min/{1.73_m2} — ABNORMAL LOW (ref >60)
Globulin: 3.6 g/dL (ref 2.0–4.0)
Glucose: 160 MG/DL — ABNORMAL HIGH (ref 65–100)
Potassium: 3.7 MMOL/L (ref 3.5–5.1)
Protein, total: 6 g/dL — ABNORMAL LOW (ref 6.4–8.2)
Sodium: 155 MMOL/L — ABNORMAL HIGH (ref 136–145)

## 2009-05-07 LAB — BLOOD GAS ARTERIAL,VENT
BASE DEFICIT: 5 mmol/L — AB
BICARBONATE: 18 mmol/L — ABNORMAL LOW (ref 22–26)
FIO2: 40 %
O2 SAT: 98 % — ABNORMAL HIGH (ref 92–97)
PCO2: 27 mmHg — CL (ref 35–45)
PEEP/CPAP: 5
PO2: 110 mmHg — ABNORMAL HIGH (ref 80–100)
SET RATE: 10
SPONTANEOUS RATE: 23
VT/PIP: 500
pH: 7.43 (ref 7.35–7.45)

## 2009-05-07 LAB — PROTHROMBIN TIME + INR
INR: 1.7 — ABNORMAL HIGH (ref 0.9–1.1)
Prothrombin time: 16.9 SECS — ABNORMAL HIGH (ref 9.0–11.0)

## 2009-05-07 LAB — MAGNESIUM: Magnesium: 3 MG/DL — ABNORMAL HIGH (ref 1.6–2.4)

## 2009-05-07 MED ADMIN — sodium chloride (NS) flush 10 mL: @ 22:00:00 | NDC 87701099893

## 2009-05-07 MED ADMIN — levothyroxine (SYNTHROID) tablet 50 mcg: ORAL | @ 10:00:00 | NDC 51079044401

## 2009-05-07 MED ADMIN — vancomycin (VANCOCIN) 1 g in 0.9% sodium chloride 250 mL IVPB: INTRAVENOUS | @ 22:00:00 | NDC 63323031461

## 2009-05-07 MED ADMIN — metoprolol tartrate (LOPRESSOR) injection 2.5 mg: INTRAVENOUS | @ 04:00:00 | NDC 00143987310

## 2009-05-07 MED ADMIN — sodium chloride (NS) 0.9 % flush: @ 08:00:00 | NDC 87701099893

## 2009-05-07 MED ADMIN — albuterol/ipratropium (DUONEB) neb solution: RESPIRATORY_TRACT | @ 17:00:00 | NDC 00487980101

## 2009-05-07 MED ADMIN — lactulose (CHRONULAC) solution 20 g: ORAL | @ 13:00:00 | NDC 00121457730

## 2009-05-07 MED ADMIN — albuterol/ipratropium (DUONEB) neb solution: RESPIRATORY_TRACT | @ 23:00:00 | NDC 00487980101

## 2009-05-07 MED ADMIN — dextrose 5% with KCl 20 mEq/L infusion: INTRAVENOUS | @ 19:00:00 | NDC 00338068304

## 2009-05-07 MED ADMIN — albuterol/ipratropium (DUONEB) neb solution: RESPIRATORY_TRACT | @ 21:00:00 | NDC 00487980101

## 2009-05-07 MED ADMIN — sodium chloride (NS) flush 10 mL: @ 02:00:00 | NDC 87701099893

## 2009-05-07 MED ADMIN — sodium chloride (NS) flush 10 mL: @ 23:00:00 | NDC 87701099893

## 2009-05-07 MED ADMIN — piperacillin-tazobactam (ZOSYN) 3.375 g in 0.9% sodium chloride (MBP/ADV) 100 mL MBP: INTRAVENOUS | @ 18:00:00 | NDC 00338055318

## 2009-05-07 MED ADMIN — piperacillin-tazobactam (ZOSYN) 2.25 g in 0.9% sodium chloride (MBP/ADV) 50 mL MBP: INTRAVENOUS | @ 10:00:00 | NDC 00338055311

## 2009-05-07 MED ADMIN — sodium chloride (NS) flush 10 mL: @ 10:00:00 | NDC 87701099893

## 2009-05-07 MED ADMIN — midazolam (VERSED) injection 2-4 mg: INTRAVENOUS | @ 06:00:00 | NDC 10019002837

## 2009-05-07 MED ADMIN — piperacillin-tazobactam (ZOSYN) 3.375 g in 0.9% sodium chloride (MBP/ADV) 100 mL MBP: INTRAVENOUS | @ 22:00:00 | NDC 00338055318

## 2009-05-07 MED ADMIN — metoprolol tartrate (LOPRESSOR) injection 2.5 mg: INTRAVENOUS | @ 22:00:00 | NDC 00143987310

## 2009-05-07 MED ADMIN — pantoprazole (PROTONIX) injection 40 mg: INTRAVENOUS | @ 13:00:00 | NDC 00409488810

## 2009-05-07 MED ADMIN — piperacillin-tazobactam (ZOSYN) 2.25 g in 0.9% sodium chloride (MBP/ADV) 50 mL MBP: INTRAVENOUS | @ 02:00:00 | NDC 00338055311

## 2009-05-07 MED ADMIN — midazolam (VERSED) injection 2-4 mg: INTRAVENOUS | @ 23:00:00 | NDC 10019002837

## 2009-05-07 MED ADMIN — sodium chloride (NS) flush 10 mL: @ 18:00:00 | NDC 87701099893

## 2009-05-07 MED ADMIN — albuterol/ipratropium (DUONEB) neb solution: RESPIRATORY_TRACT | @ 13:00:00 | NDC 00487980101

## 2009-05-07 MED ADMIN — enoxaparin (LOVENOX) injection 70 mg: SUBCUTANEOUS | @ 13:00:00 | NDC 00075062430

## 2009-05-07 MED ADMIN — sodium chloride (NS) flush 10 mL: @ 19:00:00 | NDC 87701099893

## 2009-05-07 MED ADMIN — midazolam (VERSED) injection 2-4 mg: INTRAVENOUS | @ 02:00:00 | NDC 10019002837

## 2009-05-07 MED ADMIN — levofloxacin (LEVAQUIN) 750 mg infusion: INTRAVENOUS | @ 20:00:00 | NDC 50458016601

## 2009-05-07 MED ADMIN — lactulose (CHRONULAC) solution 20 g: ORAL | @ 02:00:00 | NDC 00121457730

## 2009-05-07 MED ADMIN — dextrose 5% with KCl 20 mEq/L infusion: INTRAVENOUS | @ 08:00:00 | NDC 00338068304

## 2009-05-07 MED ADMIN — aspirin (ASPIRIN) tablet 325 mg: ORAL | @ 13:00:00 | NDC 00904200960

## 2009-05-07 MED ADMIN — metoprolol tartrate (LOPRESSOR) injection 2.5 mg: INTRAVENOUS | @ 10:00:00 | NDC 00143987310

## 2009-05-07 MED ADMIN — thiamine (B-1) 100 mg in 0.9% sodium chloride 50 mL IVPB: INTRAVENOUS | @ 13:00:00 | NDC 63323001302

## 2009-05-07 MED ADMIN — metoprolol tartrate (LOPRESSOR) injection 2.5 mg: INTRAVENOUS | @ 18:00:00 | NDC 00143987310

## 2009-05-07 MED FILL — SALINE FLUSH INJECTION SYRINGE: INTRAMUSCULAR | Qty: 10

## 2009-05-07 MED FILL — LACTULOSE 10 GRAM/15 ML ORAL SOLN: 10 gram/15 mL | ORAL | Qty: 30

## 2009-05-07 MED FILL — LOVENOX 30 MG/0.3 ML SUB-Q SYRINGE: 30 mg/0.3 mL | SUBCUTANEOUS | Qty: 1

## 2009-05-07 MED FILL — MIDAZOLAM 1 MG/ML IJ SOLN: 1 mg/mL | INTRAMUSCULAR | Qty: 5

## 2009-05-07 MED FILL — ZOSYN 3.375 GRAM INTRAVENOUS SOLUTION: 3.375 gram | INTRAVENOUS | Qty: 3.38

## 2009-05-07 MED FILL — IPRATROPIUM BROMIDE 0.02 % SOLN FOR INHALATION: 0.02 % | RESPIRATORY_TRACT | Qty: 2.5

## 2009-05-07 MED FILL — D5W WITH POTASSIUM CHLORIDE 20 MEQ/L IV: 20 mEq/L | INTRAVENOUS | Qty: 1000

## 2009-05-07 MED FILL — ZOSYN 2.25 GRAM INTRAVENOUS SOLUTION: 2.25 gram | INTRAVENOUS | Qty: 2.25

## 2009-05-07 MED FILL — METOPROLOL TARTRATE 5 MG/5 ML IV SOLN: 5 mg/ mL | INTRAVENOUS | Qty: 5

## 2009-05-07 MED FILL — SODIUM CHLORIDE 0.9 % INJECTION: INTRAMUSCULAR | Qty: 10

## 2009-05-07 MED FILL — LEVAQUIN 750 MG/150 ML IN 5 % DEXTROSE INTRAVENOUS PIGGYBACK: 750 mg/150 mL | INTRAVENOUS | Qty: 150

## 2009-05-07 MED FILL — VANCOMYCIN 10 GRAM IV SOLR: 10 gram | INTRAVENOUS | Qty: 1

## 2009-05-07 MED FILL — THIAMINE 100 MG/ML INJECTION: 100 mg/mL | INTRAMUSCULAR | Qty: 1

## 2009-05-07 MED FILL — ASPIRIN 325 MG TAB: 325 mg | ORAL | Qty: 1

## 2009-05-07 MED FILL — LEVOTHYROXINE 25 MCG TAB: 25 mcg | ORAL | Qty: 2

## 2009-05-07 NOTE — Progress Notes (Signed)
 Formatting of this note is different from the original.  PULMONARY/Critical Care/SLEEP MEDICINE - [facility]  Name: Towana Stenglein   DOB: November 23, 1960   MRN: [id number]   Date: 05/07/2009 9:59 AM   [x]     I have reviewed the flowsheet and previous day?s notes.  [x]    The patient is unable to give any meaningful history or review of systems because the patient is:  [x]    Intubated []       [x]    Sedated    []    Unresponsive      [x]    The patient is critically ill on      [x]    Mechanical ventilation []    Pressors   []    BiPAP []           MND:Mzcpzt of systems not obtained due to patient factors.    Vital Signs:    BP 120/52  Pulse 73  Temp 99.8 F (37.7 C)  Resp 18  Ht 5' 2 (1.575 m)  Wt 144 lb 13.5 oz (65.7 kg)  SpO2 100%  PF 65 L/min    O2 Device: Endotracheal tube   O2 Flow Rate (L/min): 2 l/min   Temp (24hrs), Avg:99.2 F (37.3 C), Min:98.3 F (36.8 C), Max:99.9 F (37.7 C)      Intake/Output:   Last shift:      In: 200 (100 I.V.)  Out: 200 (200 Urine)    Last 3 shifts: In: 6099.4 (5069.4 I.V.)  Out: 2595 (2335 Urine)      Intake/Output Summary (Last 24 hours) at 05/07/09 0959  Last data filed at 05/07/09 0900   Gross per 24 hour   Intake   3655 ml   Output   1915 ml   Net   1740 ml     Hemodynamics:   PAP:     Wedge:     CVP:      CO:     CI:     SVR:     PVR:        Ventilator Settings:  Ventilator  Mode: Assist control  Respiratory Rate  Resp Rate Set: 10   Resp Rate Observed: 20   Insp Flow (l/min): 65 l/sec  Insp Rise Time (%): 50 %  I:E Ratio: 1.2.4  Ventilator Volumes  Vt Set (ml): 500 ml  Vt Observed (ml): 526 ml  Vt Spont (ml): 486 ml  Ve Observed (l/min): 14 l/min  Ventilator Pressures  Pressure Support (cm H2O): 20 cm H2O  PIP Observed (cm H2O): 31 cm H2O  Plateau Pressure (cm H2O): 23 cm H2O  MAP (cm H2O): 14   PEEP/CPAP (cm H2O): 5 cm H20    Physical Exam:    General: [x]    Intubated/sedated []    No acute distress []        [x]    Ill appearing []     []          HEENT: []     Icteric [x]    PERRL []        [x]    Anicteric Mucosa:[x]    moist   []    dry []         Resp: []    Wheeze []    Clear to ascultation bilaterally []    Accessory muscle use    [x]    Rales []    Chest tube:  []    Air leak []        []    Ronchi []    Crepitus []       [  x]   Coarse bilateral ventilator breath sounds     CV: [x]    Regular []    Tachycardia []        []    Irregular []    Bradycardic []        []    Murmur []    S3 []        []    Edema []    S4 []         GI: [x]    Soft  [x]    NG Tube Bowel sounds: [x]    present []    absent    []    Firm []    PEG []        []    Distended  []         GU: [x]    Foley []    Hematuria []        [x]    Clear urine []    Edema []         MSK:    []    SCD?s []    Edema []        [x]    No deformities []     []         Skin: [x]    Warm [x]    Dry  []        []    Cool []    Moist []        []    Hot  []    Diaphoretic []        []    Rash  []    Cyanosis []         N-psych: [x]    Sedated  []    Agitated []        []    Alert  Follows commands:   []    Yes  [x]    No []         Devices: [x]    ET-Tube []    Tracheostomy []    Chest tube: Air leak? []    Y/[]    N    []    Central Line []    PA Catheter []        []    PICC []     []         DATA:   Current facility-administered medications   Medication Dose Route Frequency   ? sodium chloride  (NS) 0.9 % flush        ? piperacillin-tazobactam (ZOSYN) 3.375 g in 0.9% sodium chloride  (MBP/ADV) 100 mL MBP  3.375 g IntraVENous Q6H   ? enoxaparin  (LOVENOX ) injection 70 mg  70 mg SubCUTAneous Q12H   ? DISCONTD: enoxaparin  (LOVENOX ) injection 66 mg  1 mg/kg SubCUTAneous Q12H   ? levothyroxine (SYNTHROID) tablet 50 mcg  50 mcg Oral ACB   ? lactulose (CHRONULAC) solution 20 g  20 g Oral BID   ? aspirin (ASPIRIN) tablet 325 mg  325 mg Oral DAILY   ? dextrose 5% with KCl 20 mEq/L infusion    IntraVENous CONTINUOUS   ? albumin human 5% (BUMINATE) solution 25 g  25 g IntraVENous Q4H PRN   ? vancomycin (VANCOCIN) 1 g in 0.9% sodium chloride  250 mL IVPB  1,000 mg IntraVENous Q24H   ? metoprolol  tartrate  (LOPRESSOR ) injection 2.5 mg  2.5 mg IntraVENous Q6H   ? morphine  injection 1 mg  1 mg IntraVENous PRN   ? pantoprazole  (PROTONIX ) injection 40 mg  40 mg IntraVENous DAILY   ? levofloxacin (LEVAQUIN) 750 mg infusion   750 mg IntraVENous Q48H   ? albuterol /ipratropium (DUONEB ) neb solution   1 Dose Nebulization Q4HWA RT   ? midazolam (VERSED) injection 2-4 mg  2-4 mg IntraVENous Q1H PRN   ? sodium chloride  (NS) flush 10 mL  10 mL InterCATHeter Q8H   ? sodium chloride  (NS) flush 10 mL  10 mL InterCATHeter PRN   ? heparin (porcine) pf 300 Units  300 Units InterCATHeter PRN   ? sodium chloride  (NS) flush 20 mL  20 mL InterCATHeter PRN   ? DISCONTD: propofol (DIPRIVAN) infusion  5-50 mcg/kg/min IntraVENous TITRATE   ? DISCONTD: piperacillin-tazobactam (ZOSYN) 2.25 g in 0.9% sodium chloride  (MBP/ADV) 50 mL MBP  2.25 g IntraVENous Q8H   ? DISCONTD: lorazepam (ATIVAN) injection 1-2 mg  1-2 mg IntraVENous Q6H PRN   ? acetaminophen  (TYLENOL ) suppository 650 mg  650 mg Rectal Q6H PRN   ? albuterol  (PROVENTIL  VENTOLIN ) nebulizer solution 2.5 mg  2.5 mg Nebulization Q2H PRN   ? thiamine (B-1) 100 mg in 0.9% sodium chloride  50 mL IVPB  100 mg IntraVENous DAILY   ? nitroglycerin (NITROBID) 2 % ointment 2 Inch  2 Inch Topical Q6H PRN   ? saline peripheral flush 5 mL  5 mL InterCATHeter PRN     Telemetry: [x]    Sinus []    A-flutter []    Paced    []    A-fib []    Multiple PVC?s     Labs:  Recent Labs   Basename 05/07/09 0400 05/06/09 0445 05/05/09 0530   ? WBC 12.2* 12.1* 12.0*   ? HGB 9.5* 9.4* 10.0*   ? HCT 30.9* 30.1* 32.5*   ? PLT 74* 86* 94*     Recent Labs   Basename 05/07/09 0400 05/06/09 0445 05/05/09 0530   ? NA 155* 156* 159*   ? K 3.7 3.5 3.1*   ? CL 125* 123* 125*   ? CO2 24 23 24    ? GLU 160* 195* 165*   ? BUN 40* 51* 44*   ? CREA 1.1 1.5* 1.8*   ? CA 9.2 8.8 8.2*   ? MG 3.0* -- 2.6*   ? PHOS -- -- 3.0   ? ALB 2.4* 2.5* 2.4*   ? TBIL 4.2* 4.4* 4.5*   ? SGOT 78* 101* 170*   ? INR 1.7* -- 3.0*     Recent Labs   Basename 05/07/09  0410 05/06/09 0403 05/05/09 0409   ? PH 7.43 7.41 7.46*   ? PCO2 27* 33* 31*   ? PO2 110* 104* 72*   ? HCO3 18* 21* 21*   ? FIO2 40 50 50     Imaging:  [x]    I have personally reviewed the patient?s radiographs  []    Radiographs reviewed with radiologist   [x]    No change from prior, tubes and lines in adequate position  []    Improved   []    Worsening    IMPRESSION:     The patient is: [x]    acutely ill Risk of deterioration: []    moderate    []    critically ill  [x]    high     Patient Active Hospital Problem List:  Nausea with vomiting (05/01/2009)    Hypokalemia (05/01/2009)    Hepatic encephalopathy (05/01/2009)    Gait abnormality (05/01/2009)    Alcoholic cirrhosis of liver (05/01/2009)    Hypomagnesemia (05/01/2009)         PLAN:  1. Adjust vent settings  2. Cont iv abx  3. Jet nebs  4. PUD/DVT prophylaxis  5. NPO  6. Fully sedated     [x]    See my orders for details  My assessment/plan was discussed with:  [x]    nursing []    PT/OT    []    respiratory therapy []    Dr.   []    family []         []    Total critical care time exclusive of procedures       minutes  Catalina FABIENE Sanders, MD      Electronically signed by Sanders Catalina SAUNDERS, MD at 05/07/2009 10:01 AM EDT

## 2009-05-07 NOTE — Progress Notes (Signed)
 Formatting of this note is different from the original.  Cardiology Progress Note    05/07/2009 10:13 AM    Admit Date: 04/29/2009    Subjective:     Remains critically ill--intubated, sedated on vent.  Hemodynamically stable.    BP 120/52  Pulse 73  Temp 99.8 F (37.7 C)  Resp 18  Ht 5' 2 (1.575 m)  Wt 144 lb 13.5 oz (65.7 kg)  SpO2 100%  PF 65 L/min  Current facility-administered medications   Medication Dose Route Frequency   ? sodium chloride  (NS) 0.9 % flush        ? piperacillin-tazobactam (ZOSYN) 3.375 g in 0.9% sodium chloride  (MBP/ADV) 100 mL MBP  3.375 g IntraVENous Q6H   ? enoxaparin  (LOVENOX ) injection 70 mg  70 mg SubCUTAneous Q12H   ? dextrose  5% with KCl 20 mEq/L infusion    IntraVENous CONTINUOUS   ? DISCONTD: enoxaparin  (LOVENOX ) injection 66 mg  1 mg/kg SubCUTAneous Q12H   ? levothyroxine (SYNTHROID) tablet 50 mcg  50 mcg Oral ACB   ? lactulose (CHRONULAC) solution 20 g  20 g Oral BID   ? aspirin (ASPIRIN) tablet 325 mg  325 mg Oral DAILY   ? albumin human 5% (BUMINATE) solution 25 g  25 g IntraVENous Q4H PRN   ? vancomycin (VANCOCIN) 1 g in 0.9% sodium chloride  250 mL IVPB  1,000 mg IntraVENous Q24H   ? metoprolol  tartrate (LOPRESSOR ) injection 2.5 mg  2.5 mg IntraVENous Q6H   ? DISCONTD: dextrose  5% with KCl 20 mEq/L infusion    IntraVENous CONTINUOUS   ? morphine  injection 1 mg  1 mg IntraVENous PRN   ? pantoprazole  (PROTONIX ) injection 40 mg  40 mg IntraVENous DAILY   ? levofloxacin (LEVAQUIN) 750 mg infusion   750 mg IntraVENous Q48H   ? albuterol /ipratropium (DUONEB ) neb solution   1 Dose Nebulization Q4HWA RT   ? midazolam (VERSED) injection 2-4 mg  2-4 mg IntraVENous Q1H PRN   ? sodium chloride  (NS) flush 10 mL  10 mL InterCATHeter Q8H   ? sodium chloride  (NS) flush 10 mL  10 mL InterCATHeter PRN   ? heparin (porcine) pf 300 Units  300 Units InterCATHeter PRN   ? sodium chloride  (NS) flush 20 mL  20 mL InterCATHeter PRN   ? DISCONTD: propofol  (DIPRIVAN ) infusion  5-50 mcg/kg/min  IntraVENous TITRATE   ? DISCONTD: piperacillin-tazobactam (ZOSYN) 2.25 g in 0.9% sodium chloride  (MBP/ADV) 50 mL MBP  2.25 g IntraVENous Q8H   ? DISCONTD: lorazepam (ATIVAN) injection 1-2 mg  1-2 mg IntraVENous Q6H PRN   ? acetaminophen  (TYLENOL ) suppository 650 mg  650 mg Rectal Q6H PRN   ? albuterol  (PROVENTIL  VENTOLIN ) nebulizer solution 2.5 mg  2.5 mg Nebulization Q2H PRN   ? thiamine (B-1) 100 mg in 0.9% sodium chloride  50 mL IVPB  100 mg IntraVENous DAILY   ? nitroglycerin (NITROBID) 2 % ointment 2 Inch  2 Inch Topical Q6H PRN   ? saline peripheral flush 5 mL  5 mL InterCATHeter PRN         Objective:     Physical Exam:  Intubated, sedated on vent  Chest--clear  CV--rrr Nl S1S2 I/VI sem  Abd--benign  Ext--unchanged    Data Review:   Labs:  Recent Results (from the past 24 hour(s))   CBC WITH AUTOMATED DIFF    Collection Time    05/07/09  4:00 AM   Component Value Range   ? WBC 12.2 (*) 3.6 - 11.0 (K/uL)   ?  RBC 3.56 (*) 3.80 - 5.20 (M/uL)   ? HGB 9.5 (*) 11.5 - 16.0 (g/dL)   ? HCT 30.9 (*) 35.0 - 47.0 (%)   ? MCV 86.8  80.0 - 99.0 (FL)   ? MCH 26.7  26.0 - 34.0 (PG)   ? MCHC 30.7  30.0 - 36.5 (g/dL)   ? RDW 19.5 (*) 11.5 - 14.5 (%)   ? PLATELET 74 (*) 150 - 400 (K/uL)   ? NEUTROPHILS 79 (*) 32 - 75 (%)   ? LYMPHOCYTES 11 (*) 12 - 49 (%)   ? MONOCYTES 7  5 - 13 (%)   ? EOSINOPHILS 3  0 - 7 (%)   ? BASOPHILS 0  0 - 1 (%)   ? ABSOLUTE NEUTS 9.7 (*) 1.8 - 8.0 (K/UL)   ? ABSOLUTE LYMPHS 1.4  0.8 - 3.5 (K/UL)   ? ABSOLUTE MONOS 0.8  0.0 - 1.0 (K/UL)   ? ABSOLUTE EOSINS 0.3  0.0 - 0.4 (K/UL)   ? ABSOLUTE BASOS 0.0  0.0 - 0.1 (K/UL)   METABOLIC PANEL, COMPREHENSIVE    Collection Time    05/07/09  4:00 AM   Component Value Range   ? Sodium 155 (*) 136 - 145 (MMOL/L)   ? Potassium 3.7  3.5 - 5.1 (MMOL/L)   ? Chloride 125 (*) 97 - 108 (MMOL/L)   ? CO2 24  21 - 32 (MMOL/L)   ? Anion gap 6  5 - 15 (mmol/L)   ? Glucose 160 (*) 65 - 100 (MG/DL)   ? BUN 40 (*) 6 - 20 (MG/DL)   ? Creatinine 1.1  0.6 - 1.3 (MG/DL)   ?  BUN/Creatinine ratio 36 (*) 12 - 20 ( )   ? GFR est AA >60  >60 (ml/min/1.56m2)   ? GFR est non-AA 56 (*) >60 (ml/min/1.41m2)   ? Calcium 9.2  8.5 - 10.1 (MG/DL)   ? Bilirubin, total 4.2 (*) 0.2 - 1.0 (MG/DL)   ? ALT 99 (*) 12 - 78 (U/L)   ? AST 78 (*) 15 - 37 (U/L)   ? Alk. phosphatase 125  50 - 136 (U/L)   ? Protein, total 6.0 (*) 6.4 - 8.2 (g/dL)   ? Albumin 2.4 (*) 3.5 - 5.0 (g/dL)   ? Globulin 3.6  2.0 - 4.0 (g/dL)   ? A-G Ratio 0.7 (*) 1.1 - 2.2 ( )   MAGNESIUM     Collection Time    05/07/09  4:00 AM   Component Value Range   ? Magnesium  3.0 (*) 1.6 - 2.4 (MG/DL)   PROTHROMBIN TIME    Collection Time    05/07/09  4:00 AM   Component Value Range   ? INR 1.7 (*) 0.9 - 1.1 ( )   ? Prothrombin Time-PT 16.9 (*) 9.0 - 11.0 (SECS)   BLOOD GAS, ARTERIAL    Collection Time    05/07/09  4:10 AM   Component Value Range   ? pH 7.43  7.35 - 7.45 ( )   ? PCO2 27 (*) 35 - 45 (mmHg)   ? PO2 110 (*) 80 - 100 (mmHg)   ? O2 SAT 98 (*) 92 - 97 (%)   ? BICARBONATE 18 (*) 22 - 26 (mmol/L)   ? BASE DEFICIT 5.0 (*) BE NORMAL RANGE -3 T (mmol/L)   ? FIO2 40  (%)   ? SITE RR     ? MODE A/C     ? SET RATE 10  ( )   ?  SPONTANEOUS RATE 23  ( )   ? VT/PIP 500  ( )   ? PEEP/CPAP 5  ( )   ? SAMPLE SOURCE ARTERIAL     ? O2 METHOD VENTILATOR       Telemetry: normal sinus rhythm    Assessment:     Patient Active Hospital Problem List:  Nausea with vomiting (05/01/2009)    Hypokalemia (05/01/2009)    Hepatic encephalopathy (05/01/2009)    Gait abnormality (05/01/2009)    Alcoholic cirrhosis of liver (05/01/2009)    Hypomagnesemia (05/01/2009)    Plan:     Continue supportive rx  ASA, beta blocker, lovenox   Continue vent support    Norleen MICAEL Gelineau, MD      Electronically signed by Gelineau Norleen ORN, MD at 05/07/2009 10:15 AM EDT

## 2009-05-07 NOTE — Progress Notes (Signed)
 Formatting of this note might be different from the original.  Ortho-    Alignment in cast looks good.  Plan would be to remove cast in 4 weeks.    Lupita NOVAK. Boyd, MD    Electronically signed by Boyd Lupita NOVAK, MD at 05/07/2009  8:25 AM EDT

## 2009-05-07 NOTE — Progress Notes (Signed)
 Formatting of this note is different from the original.  Neurology Progress Note    Patient ID:  Breanna Lester  [id number]  48 y.o.  08-Aug-1961    Subjective:     Patient none..    Current facility-administered medications   Medication Dose Route Frequency   ? sodium chloride  (NS) 0.9 % flush        ? enoxaparin  (LOVENOX ) injection 66 mg  1 mg/kg SubCUTAneous Q12H   ? piperacillin-tazobactam (ZOSYN) 3.375 g in 0.9% sodium chloride  (MBP/ADV) 100 mL MBP  3.375 g IntraVENous Q6H   ? levothyroxine (SYNTHROID) tablet 50 mcg  50 mcg Oral ACB   ? lactulose (CHRONULAC) solution 20 g  20 g Oral BID   ? aspirin (ASPIRIN) tablet 325 mg  325 mg Oral DAILY   ? dextrose 5% with KCl 20 mEq/L infusion    IntraVENous CONTINUOUS   ? albumin human 5% (BUMINATE) solution 25 g  25 g IntraVENous Q4H PRN   ? vancomycin (VANCOCIN) 1 g in 0.9% sodium chloride  250 mL IVPB  1,000 mg IntraVENous Q24H   ? metoprolol  tartrate (LOPRESSOR ) injection 2.5 mg  2.5 mg IntraVENous Q6H   ? morphine  injection 1 mg  1 mg IntraVENous PRN   ? pantoprazole  (PROTONIX ) injection 40 mg  40 mg IntraVENous DAILY   ? levofloxacin (LEVAQUIN) 750 mg infusion   750 mg IntraVENous Q48H   ? albuterol /ipratropium (DUONEB ) neb solution   1 Dose Nebulization Q4HWA RT   ? midazolam (VERSED) injection 2-4 mg  2-4 mg IntraVENous Q1H PRN   ? sodium chloride  (NS) flush 10 mL  10 mL InterCATHeter Q8H   ? sodium chloride  (NS) flush 10 mL  10 mL InterCATHeter PRN   ? heparin (porcine) pf 300 Units  300 Units InterCATHeter PRN   ? sodium chloride  (NS) flush 20 mL  20 mL InterCATHeter PRN   ? DISCONTD: propofol (DIPRIVAN) infusion  5-50 mcg/kg/min IntraVENous TITRATE   ? DISCONTD: piperacillin-tazobactam (ZOSYN) 2.25 g in 0.9% sodium chloride  (MBP/ADV) 50 mL MBP  2.25 g IntraVENous Q8H   ? DISCONTD: enoxaparin  (LOVENOX ) injection 60 mg  60 mg SubCUTAneous Q12H   ? DISCONTD: lorazepam (ATIVAN) injection 1-2 mg  1-2 mg IntraVENous Q6H PRN   ? acetaminophen  (TYLENOL ) suppository 650 mg   650 mg Rectal Q6H PRN   ? albuterol  (PROVENTIL  VENTOLIN ) nebulizer solution 2.5 mg  2.5 mg Nebulization Q2H PRN   ? thiamine (B-1) 100 mg in 0.9% sodium chloride  50 mL IVPB  100 mg IntraVENous DAILY   ? nitroglycerin (NITROBID) 2 % ointment 2 Inch  2 Inch Topical Q6H PRN   ? saline peripheral flush 5 mL  5 mL InterCATHeter PRN         Review of Systems:    Review of systems not obtained due to patient factors.    Objective:     Patient Vitals in the past 8 hrs:   BP Temp Pulse Resp SpO2   05/07/09 0700 102/51 mmHg - 67  22  100 %   05/07/09 0600 111/52 mmHg - 69  21  97 %   05/07/09 0500 106/50 mmHg - 70  23  100 %   05/07/09 0403 - - 64  - 100 %   05/07/09 0400 103/49 mmHg 99 F (37.2 C) 65  24  100 %   05/07/09 0300 100/47 mmHg - 65  22  100 %   05/07/09 0200 115/55 mmHg - 74  26  100 %  05/07/09 0100 100/46 mmHg - 78  22  99 %   05/07/09 0010 - - 75  - 99 %   05/07/09 0000 107/50 mmHg 99.9 F (37.7 C) 76  22  99 %       In: 6099.4 (5069.4 I.V.)  Out: 2595 (2335 Urine)      Lab Review Recent Results (from the past 24 hour(s))   CBC WITH AUTOMATED DIFF    Collection Time    05/07/09  4:00 AM   Component Value Range   ? WBC 12.2 (*) 3.6 - 11.0 (K/uL)   ? RBC 3.56 (*) 3.80 - 5.20 (M/uL)   ? HGB 9.5 (*) 11.5 - 16.0 (g/dL)   ? HCT 30.9 (*) 35.0 - 47.0 (%)   ? MCV 86.8  80.0 - 99.0 (FL)   ? MCH 26.7  26.0 - 34.0 (PG)   ? MCHC 30.7  30.0 - 36.5 (g/dL)   ? RDW 19.5 (*) 11.5 - 14.5 (%)   ? PLATELET 74 (*) 150 - 400 (K/uL)   ? NEUTROPHILS 79 (*) 32 - 75 (%)   ? LYMPHOCYTES 11 (*) 12 - 49 (%)   ? MONOCYTES 7  5 - 13 (%)   ? EOSINOPHILS 3  0 - 7 (%)   ? BASOPHILS 0  0 - 1 (%)   ? ABSOLUTE NEUTS 9.7 (*) 1.8 - 8.0 (K/UL)   ? ABSOLUTE LYMPHS 1.4  0.8 - 3.5 (K/UL)   ? ABSOLUTE MONOS 0.8  0.0 - 1.0 (K/UL)   ? ABSOLUTE EOSINS 0.3  0.0 - 0.4 (K/UL)   ? ABSOLUTE BASOS 0.0  0.0 - 0.1 (K/UL)   METABOLIC PANEL, COMPREHENSIVE    Collection Time    05/07/09  4:00 AM   Component Value Range   ? Sodium 155 (*) 136 - 145 (MMOL/L)   ?  Potassium 3.7  3.5 - 5.1 (MMOL/L)   ? Chloride 125 (*) 97 - 108 (MMOL/L)   ? CO2 24  21 - 32 (MMOL/L)   ? Anion gap 6  5 - 15 (mmol/L)   ? Glucose 160 (*) 65 - 100 (MG/DL)   ? BUN 40 (*) 6 - 20 (MG/DL)   ? Creatinine 1.1  0.6 - 1.3 (MG/DL)   ? BUN/Creatinine ratio 36 (*) 12 - 20 ( )   ? GFR est AA >60  >60 (ml/min/1.30m2)   ? GFR est non-AA 56 (*) >60 (ml/min/1.29m2)   ? Calcium 9.2  8.5 - 10.1 (MG/DL)   ? Bilirubin, total 4.2 (*) 0.2 - 1.0 (MG/DL)   ? ALT 99 (*) 12 - 78 (U/L)   ? AST 78 (*) 15 - 37 (U/L)   ? Alk. phosphatase 125  50 - 136 (U/L)   ? Protein, total 6.0 (*) 6.4 - 8.2 (g/dL)   ? Albumin 2.4 (*) 3.5 - 5.0 (g/dL)   ? Globulin 3.6  2.0 - 4.0 (g/dL)   ? A-G Ratio 0.7 (*) 1.1 - 2.2 ( )   MAGNESIUM     Collection Time    05/07/09  4:00 AM   Component Value Range   ? Magnesium  3.0 (*) 1.6 - 2.4 (MG/DL)   PROTHROMBIN TIME    Collection Time    05/07/09  4:00 AM   Component Value Range   ? INR 1.7 (*) 0.9 - 1.1 ( )   ? Prothrombin Time-PT 16.9 (*) 9.0 - 11.0 (SECS)   BLOOD GAS, ARTERIAL    Collection Time  05/07/09  4:10 AM   Component Value Range   ? pH 7.43  7.35 - 7.45 ( )   ? PCO2 27 (*) 35 - 45 (mmHg)   ? PO2 110 (*) 80 - 100 (mmHg)   ? O2 SAT 98 (*) 92 - 97 (%)   ? BICARBONATE 18 (*) 22 - 26 (mmol/L)   ? BASE DEFICIT 5.0 (*) BE NORMAL RANGE -3 T (mmol/L)   ? FIO2 40  (%)   ? SITE RR     ? MODE A/C     ? SET RATE 10  ( )   ? SPONTANEOUS RATE 23  ( )   ? VT/PIP 500  ( )   ? PEEP/CPAP 5  ( )   ? SAMPLE SOURCE ARTERIAL     ? O2 METHOD VENTILATOR       Additional comments:I reviewed the patient's other test results. labs    NEUROLOGICAL EXAM:    Appearance:  The patient is well developed, well nourished, sedated   Mental Status: Sedated and intubated   Cranial Nerves:   unreliable visual fields. Fundi are benign. PERLA, EOM's full, no nystagmus, no ptosis. Facial sensation is normal. Corneal reflexes are intact. Facial movement is symmetric. Palate is midline with normal sternocleidomastoid and trapezius muscles  are normal. Tongue is midline.   Motor:  2/5 strength in upper and lower proximal and distal muscles. No posturing noted but bilateral babinski reflexes present. Normal bulk and tone. No fasciculations.   Reflexes:   Deep tendon reflexes 1+/4 and symmetrical.   Sensory:   Withdraws non purposefully to pain   Gait:  Untestable gait.   Tremor:   No tremor noted.   Cerebellar:  No cerebellar signs present.   Neurovascular:  Normal heart sounds and regular rhythm, peripheral pulses intact, and no carotid bruits.     Assessment:     Patient Active Hospital Problem List:  Nausea with vomiting (05/01/2009)    Hypokalemia (05/01/2009)    Hepatic encephalopathy (05/01/2009)    Gait abnormality (05/01/2009)    Alcoholic cirrhosis of liver (05/01/2009)    Hypomagnesemia (05/01/2009)    Plan:   Encephalopathy persists in critically ill Pt due to hepatic and sepsis encephalopathy  No focal findings but Pt more responsive, opens eyes, moves more to pain doesn't follow commands or focus with eyes, but a little lighter in coma.  Will recheck again Monday call if needed over weekend    Signed:  Debby A. Claudene, MD  05/07/2009  7:41 AM    Electronically signed by Claudene Debby LABOR, MD at 05/07/2009  7:47 AM EDT

## 2009-05-07 NOTE — Progress Notes (Signed)
 Formatting of this note might be different from the original.  Problem: Falls - Risk of  Goal: *Knowledge of fall prevention  Outcome: Not Progressing Towards Goal  Patient intubated altered mental status unable to learn or retain information        Electronically signed by Ferguson, Kyleigh E, RN at 05/07/2009  1:01 AM EDT

## 2009-05-07 NOTE — Progress Notes (Signed)
 Formatting of this note is different from the original.       Hospitalist Progress Note         NAME: Breanna Lester        DOB:  Jan 09, 1961        MRN:  [id number]      Assessment & Plan   Sepsis syndrome :underlying pneumonia, fever, leukocytosis  Broad spectrum antibiotics.  No evidence of UTI or ascites(SBP), has an umbilical hernia that is not     reducible but overall adbomen is soft, has RUQ pain, will request new US  abdomen    Acute respiratory failure:intubated with underlying Pneumonia in ICU    Altered mental status : multifactorial Sepsis, hypernatremia, ARF, hyperamonemia     Hypernatremia: c/w free water  thru NGT    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,c/w lactulose thru NGT    Gait abnormality (05/01/2009) B12, folate normal, PT/OT when MS improved    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    Left wrist Fracture: casted as per Ortho ORIF when stable    Hypothyroidism: c/w  L-Thyroxine 50mcg     Subjective:     48yo caucasian female    Chief Complaint:  In ICU intubated , awake does not follow commands     Discussed with RN events overnight.     Review of Systems:  Fever/chills y   Cough N   Sputum    N   SOB/DOE  N   Chest Pain N   Abdominal Pain N   Diarrhea N   Constipation N   Nausea/Vomiting N   Dysuria N   Tolerating PT N   Tolerating Diet N   Could not obtain due to AMS vs intubation xx       Objective:   Patient intubated this AM with acute respiratory failure, critically ill, sedation was D/C more awake today does not  follow commands    VITALS:   Last 24hrs VS reviewed since prior progress note. Most recent are:  Visit Vitals   Item Reading   ? BP 102/51   ? Pulse 67   ? Temp 99 F (37.2 C)   ? Resp 22   ? Ht 5' 2 (1.575 m)   ? Wt 144 lb 13.5 oz (65.7 kg)   ? SpO2 100%   ? PF 65L/min     Intake/Output Summary (Last 24 hours) at 05/07/09 0732  Last data filed at 05/07/09 0600   Gross per 24 hour   Intake 3712.8 ml   Output   1830 ml   Net 1882.8 ml       Telemetry Reviewed:     PHYSICAL EXAM:  General: Intubated, sedated  HEENT: Pupils unequal, R 3mm, L 2mm  Lungs:  Bilateral rales, ronchi and mild wheezing.  Heart:  Regular rhythm, tachycardic, SEM  Abdomen: Soft, Non distended, hypoactive BS, no rebound, umbilical hernia non reducible, pain in RUQ  Extremities: No edema, pulses present, brace in left arm  Neurologic: GCS [id number], sedated  Psych: sedated    Lab Data Reviewed: (  see below)    Medications Reviewed: (see below)    PMH/SH reviewed - no change compared to H&P  ______________________________________________________________________  Total time spent with patient: 45 minutes     Critical Care Provided     Minutes non procedure based    Care Plan discussed with:  Patient    Family    RN x   Care Manager    Consultant/Specialist      >50% of visit spent in counseling and coordination of care       Prophylaxis:  GI PPI   DVT lovenox      Disposition:   Home with Family    HH/PT/OT/RN    SNF/LTC    [facility]      ______________________________________________________________________  Attending Physician: Eric DOROTHA Deutscher, MD   _____________________________________________________________________________________________________  Procedures: see electronic medical records for all procedures/Xrays and details  which were not copied into this note but were reviewed prior to creation of Plan.      LABS:  Recent Labs   Basename 05/07/09 0400 05/06/09 0445   ? WBC 12.2* 12.1*   ? HGB 9.5* 9.4*   ? HCT 30.9* 30.1*   ? PLT 74* 86*      Recent Labs   Basename 05/07/09 0400 05/06/09 0445 05/05/09 0530   ? NA 155* 156* 159*   ? K 3.7 3.5 3.1*   ? CL 125* 123* 125*   ? CO2 24 23 24    ? BUN 40* 51* 44*   ? CREA 1.1 1.5* 1.8*   ? GLU 160* 195* 165*   ? CA 9.2 8.8 8.2*   ? MG 3.0* -- 2.6*   ? PHOS -- -- 3.0   ? URICA -- -- --     Recent Labs   Basename 05/07/09 0400 05/06/09 0445 05/05/09 0530   ? SGOT 78* 101* 170*   ? GPT 99* 101* 108*   ? AP 125 129 144*   ? TBIL 4.2* 4.4* 4.5*   ? TP 6.0* 5.9* 6.0*   ? ALB 2.4* 2.5* 2.4*   ? GLOB 3.6 3.4 3.6   ? GGT -- -- --   ? AML -- -- --   ? LPSE -- -- --     Recent Labs   Basename 05/07/09 0400 05/05/09 0530   ? INR 1.7* 3.0*   ? PTP 16.9* 28.9*   ? APTT -- --         No results found for this basename: FE:2,TIBC:2,PSAT:2,FERR:2, in the last 72 hours   Recent Labs   Basename 05/07/09 0410 05/06/09 0403   ? PH 7.43 7.41   ? PCO2 27* 33*   ? PO2 110* 104*     Recent Labs   Basename 05/06/09 0445 05/05/09 0530 05/04/09 1650   ? CPK 954* 1388* 2222*   ? CKMB -- -- --     Lab Results   Component Value Date/Time    POC GLUCOSE 114 05/01/2009  4:40 PM    POC GLUCOSE 162 05/01/2009 12:02 PM    POC GLUCOSE 115 05/01/2009  8:39 AM     Lab Results   Component Value Date/Time    Color YELLOW 04/30/2009 12:14 AM    Appearance CLEAR 04/30/2009 12:14 AM    Specific gravity 1.010 10/29/2008 11:00 AM    Specific gravity 1.011 04/30/2009 12:14 AM    pH 6.0 04/30/2009 12:14 AM    Protein NEGATIVE  04/30/2009 12:14 AM    Glucose NEGATIVE  04/30/2009 12:14 AM    Ketone NEGATIVE  04/30/2009 12:14 AM    Bilirubin NEGATIVE  04/30/2009 12:14 AM    Urobilinogen 2.0 04/30/2009 12:14 AM    Nitrites NEGATIVE  04/30/2009 12:14 AM    Leukocyte Esterase TRACE 04/30/2009 12:14 AM    Epithelial cells 10-20 04/30/2009 12:14 AM    Bacteria NEGATIVE  04/30/2009 12:14 AM    WBC 0-4 04/30/2009 12:14 AM    RBC 0-3 04/30/2009 12:14 AM     MEDICATIONS:  Current facility-administered medications   Medication Dose Route Frequency   ? sodium chloride  (NS) 0.9 % flush        ?  enoxaparin  (LOVENOX ) injection 66 mg  1 mg/kg SubCUTAneous Q12H   ? levothyroxine (SYNTHROID) tablet 50 mcg  50 mcg Oral ACB   ? lactulose (CHRONULAC) solution 20 g  20 g Oral BID   ? aspirin (ASPIRIN) tablet 325 mg  325 mg Oral DAILY   ? dextrose 5% with KCl 20 mEq/L infusion    IntraVENous CONTINUOUS   ? albumin human 5% (BUMINATE) solution 25 g  25 g IntraVENous Q4H PRN   ? vancomycin (VANCOCIN) 1 g in 0.9% sodium chloride  250 mL IVPB  1,000 mg IntraVENous Q24H   ? metoprolol  tartrate (LOPRESSOR ) injection 2.5 mg  2.5 mg IntraVENous Q6H   ? morphine  injection 1 mg  1 mg IntraVENous PRN   ? pantoprazole  (PROTONIX ) injection 40 mg  40 mg IntraVENous DAILY   ? levofloxacin (LEVAQUIN) 750 mg infusion   750 mg IntraVENous Q48H   ? piperacillin-tazobactam (ZOSYN) 2.25 g in 0.9% sodium chloride  (MBP/ADV) 50 mL MBP  2.25 g IntraVENous Q8H   ? albuterol /ipratropium (DUONEB ) neb solution   1 Dose Nebulization Q4HWA RT   ? midazolam (VERSED) injection 2-4 mg  2-4 mg IntraVENous Q1H PRN   ? sodium chloride  (NS) flush 10 mL  10 mL InterCATHeter Q8H   ? sodium chloride  (NS) flush 10 mL  10 mL InterCATHeter PRN   ? heparin (porcine) pf 300 Units  300 Units InterCATHeter PRN   ? sodium chloride  (NS) flush 20 mL  20 mL InterCATHeter PRN   ? DISCONTD: propofol (DIPRIVAN) infusion  5-50 mcg/kg/min IntraVENous TITRATE   ? DISCONTD: enoxaparin  (LOVENOX ) injection 60 mg  60 mg SubCUTAneous Q12H   ? DISCONTD: lorazepam (ATIVAN) injection 1-2 mg  1-2 mg IntraVENous Q6H PRN   ? acetaminophen  (TYLENOL ) suppository 650 mg  650 mg Rectal Q6H PRN   ? albuterol  (PROVENTIL  VENTOLIN ) nebulizer solution 2.5 mg  2.5 mg Nebulization Q2H PRN   ? thiamine (B-1) 100 mg in 0.9% sodium chloride  50 mL IVPB  100 mg IntraVENous DAILY   ? nitroglycerin (NITROBID) 2 % ointment 2 Inch  2 Inch Topical Q6H PRN   ? saline peripheral flush 5 mL  5 mL InterCATHeter PRN       Electronically signed by Andris Eric PARAS, MD at 05/07/2009  7:41 AM EDT

## 2009-05-07 NOTE — Progress Notes (Addendum)
Hospitalist Progress Note         NAME: Breanna Lester        DOB:  Feb 02, 1961        MRN:  [id number]      Assessment & Plan   Sepsis syndrome :underlying pneumonia, fever, leukocytosis  ????????????????????????Broad spectrum antibiotics.  ????????????????????????No evidence of UTI or ascites(SBP), has an umbilical hernia that is not     reducible but overall adbomen is soft, has RUQ pain, will request new US abdomen    Acute respiratory failure:intubated with underlying Pneumonia in ICU    Altered mental status : multifactorial Sepsis, hypernatremia, ARF, hyperamonemia     Hypernatremia: c/w free water thru NGT    Pneumonia: c/w levaquin , zosyn and Vancomycin    Nausea with vomiting (05/01/2009) resolved, negative KUB and [place]    Hypokalemia (05/01/2009)  continue supplement KCL as needed  ????????????????????????Recheck labs    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,c/w lactulose thru NGT    Gait abnormality (05/01/2009) B12, folate normal, PT/OT when MS improved    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    Left wrist Fracture: casted as per Ortho ORIF when stable    Hypothyroidism: c/w  L-Thyroxine       Subjective:     48yo caucasian female    Chief Complaint:  In ICU intubated , awake does not follow commands     Discussed with RN events overnight.     Review of Systems:  Fever/chills y   Cough N   Sputum    N   SOB/DOE  N   Chest Pain N   Abdominal Pain N   Diarrhea N   Constipation N   Nausea/Vomiting N   Dysuria N   Tolerating PT N   Tolerating Diet N   Could not obtain due to AMS vs intubation xx        Objective:   Patient intubated this AM with acute respiratory failure, critically ill, sedation was D/C more awake today does not follow commands    VITALS:    Last 24hrs VS reviewed since prior progress note. Most recent are:  Visit Vitals   Item Reading   ??? BP 102/51   ??? Pulse 67   ??? Temp 99 ??F (37.2 ??C)   ??? Resp 22   ??? Ht 5\' 2"  (1.575 m)   ??? Wt 144 lb 13.5 oz (65.7 kg)   ??? SpO2 100%   ??? PF 65L/min           Intake/Output Summary (Last 24 hours) at 05/07/09 0732  Last data filed at 05/07/09 0600   Gross per 24 hour   Intake 3712.8 ml   Output   1830 ml   Net 1882.8 ml        Telemetry Reviewed:     PHYSICAL EXAM:  General: Intubated, sedated????  HEENT: Pupils unequal, R 3mm, L 2mm  Lungs:  Bilateral rales, ronchi and mild wheezing.  Heart:  Regular rhythm, tachycardic, SEM  Abdomen: Soft, Non distended, hypoactive BS, no rebound, umbilical hernia non reducible, pain in RUQ  Extremities: No edema, pulses present, brace in left arm  Neurologic:?? GCS [id number], sedated  Psych:???? sedated    Lab Data Reviewed: (see below)  Medications Reviewed: (see below)    PMH/SH reviewed - no change compared to H&P  ______________________________________________________________________  Total time spent with patient: 45 minutes     Critical Care Provided     Minutes non procedure based    Care Plan discussed with:  Patient    Family    RN x   Care Manager    Consultant/Specialist      >50% of visit spent in counseling and coordination of care       Prophylaxis:  GI PPI   DVT lovenox     Disposition:   Home with Family    HH/PT/OT/RN    SNF/LTC    [facility]      ______________________________________________________________________  Attending Physician: Hazeline Junker, MD   _____________________________________________________________________________________________________  Procedures: see electronic medical records for all procedures/Xrays and details  which were not copied into this note but were reviewed prior to creation of Plan.      LABS:  Recent Labs   Basename 05/07/09 0400 05/06/09 0445   ??? WBC 12.2* 12.1*   ??? HGB 9.5* 9.4*   ??? HCT 30.9* 30.1*   ??? PLT 74* 86*         Recent Labs    Basename 05/07/09 0400 05/06/09 0445 05/05/09 0530   ??? NA 155* 156* 159*   ??? K 3.7 3.5 3.1*   ??? CL 125* 123* 125*   ??? CO2 24 23 24    ??? BUN 40* 51* 44*   ??? CREA 1.1 1.5* 1.8*   ??? GLU 160* 195* 165*   ??? CA 9.2 8.8 8.2*   ??? MG 3.0* -- 2.6*   ??? PHOS -- -- 3.0   ??? URICA -- -- --         Recent Labs   Basename 05/07/09 0400 05/06/09 0445 05/05/09 0530   ??? SGOT 78* 101* 170*   ??? GPT 99* 101* 108*   ??? AP 125 129 144*   ??? TBIL 4.2* 4.4* 4.5*   ??? TP 6.0* 5.9* 6.0*   ??? ALB 2.4* 2.5* 2.4*   ??? GLOB 3.6 3.4 3.6   ??? GGT -- -- --   ??? AML -- -- --   ??? LPSE -- -- --         Recent Labs   Basename 05/07/09 0400 05/05/09 0530   ??? INR 1.7* 3.0*   ??? PTP 16.9* 28.9*   ??? APTT -- --          No results found for this basename: FE:2,TIBC:2,PSAT:2,FERR:2, in the last 72 hours   Recent Labs   Basename 05/07/09 0410 05/06/09 0403   ??? PH 7.43 7.41   ??? PCO2 27* 33*   ??? PO2 110* 104*         Recent Labs   Basename 05/06/09 0445 05/05/09 0530 05/04/09 1650   ??? CPK 954* 1388* 2222*   ??? CKMB -- -- --         Lab Results   Component Value Date/Time    POC GLUCOSE 114 05/01/2009  4:40 PM    POC GLUCOSE 162 05/01/2009 12:02 PM    POC GLUCOSE 115 05/01/2009  8:39 AM         Lab Results   Component Value Date/Time    Color YELLOW 04/30/2009 12:14 AM    Appearance CLEAR 04/30/2009 12:14 AM    Specific gravity 1.010 10/29/2008 11:00 AM    Specific gravity 1.011 04/30/2009 12:14 AM  pH 6.0 04/30/2009 12:14 AM    Protein NEGATIVE  04/30/2009 12:14 AM    Glucose NEGATIVE  04/30/2009 12:14 AM    Ketone NEGATIVE  04/30/2009 12:14 AM    Bilirubin NEGATIVE  04/30/2009 12:14 AM    Urobilinogen 2.0 04/30/2009 12:14 AM    Nitrites NEGATIVE  04/30/2009 12:14 AM    Leukocyte Esterase TRACE 04/30/2009 12:14 AM    Epithelial cells 10-20 04/30/2009 12:14 AM    Bacteria NEGATIVE  04/30/2009 12:14 AM    WBC 0-4 04/30/2009 12:14 AM    RBC 0-3 04/30/2009 12:14 AM           MEDICATIONS:  Current facility-administered medications   Medication Dose Route Frequency   ??? sodium chloride (NS) 0.9 % flush         ??? enoxaparin (LOVENOX) injection 66 mg  1 mg/kg SubCUTAneous Q12H   ??? levothyroxine (SYNTHROID) tablet 50 mcg  50 mcg Oral ACB   ??? lactulose (CHRONULAC) solution 20 g  20 g Oral BID   ??? aspirin (ASPIRIN) tablet 325 mg  325 mg Oral DAILY   ??? dextrose 5% with KCl 20 mEq/L infusion    IntraVENous CONTINUOUS   ??? albumin human 5% (BUMINATE) solution 25 g  25 g IntraVENous Q4H PRN   ??? vancomycin (VANCOCIN) 1 g in 0.9% sodium chloride 250 mL IVPB  1,000 mg IntraVENous Q24H   ??? metoprolol tartrate (LOPRESSOR) injection 2.5 mg  2.5 mg IntraVENous Q6H   ??? morphine injection 1 mg  1 mg IntraVENous PRN   ??? pantoprazole (PROTONIX) injection 40 mg  40 mg IntraVENous DAILY   ??? levofloxacin (LEVAQUIN) 750 mg infusion   750 mg IntraVENous Q48H   ??? piperacillin-tazobactam (ZOSYN) 2.25 g in 0.9% sodium chloride (MBP/ADV) 50 mL MBP  2.25 g IntraVENous Q8H   ??? albuterol/ipratropium (DUONEB) neb solution   1 Dose Nebulization Q4HWA RT   ??? midazolam (VERSED) injection 2-4 mg  2-4 mg IntraVENous Q1H PRN   ??? sodium chloride (NS) flush 10 mL  10 mL InterCATHeter Q8H   ??? sodium chloride (NS) flush 10 mL  10 mL InterCATHeter PRN   ??? heparin (porcine) pf 300 Units  300 Units InterCATHeter PRN   ??? sodium chloride (NS) flush 20 mL  20 mL InterCATHeter PRN   ??? DISCONTD: propofol (DIPRIVAN) infusion  5-50 mcg/kg/min IntraVENous TITRATE   ??? DISCONTD: enoxaparin (LOVENOX) injection 60 mg  60 mg SubCUTAneous Q12H   ??? DISCONTD: lorazepam (ATIVAN) injection 1-2 mg  1-2 mg IntraVENous Q6H PRN   ??? acetaminophen (TYLENOL) suppository 650 mg  650 mg Rectal Q6H PRN   ??? albuterol (PROVENTIL VENTOLIN) nebulizer solution 2.5 mg  2.5 mg Nebulization Q2H PRN   ??? thiamine (B-1) 100 mg in 0.9% sodium chloride 50 mL IVPB  100 mg IntraVENous DAILY   ??? nitroglycerin (NITROBID) 2 % ointment 2 Inch  2 Inch Topical Q6H PRN   ??? saline peripheral flush 5 mL  5 mL InterCATHeter PRN

## 2009-05-07 NOTE — Progress Notes (Signed)
Ortho-    Alignment in cast looks good.  Plan would be to remove cast in 4 weeks.    Karie Georges. Janann August, MD

## 2009-05-07 NOTE — Progress Notes (Signed)
Problem: Falls - Risk of  Goal: *Knowledge of fall prevention  Outcome: Not Progressing Towards Goal  Patient intubated altered mental status unable to learn or retain information

## 2009-05-07 NOTE — Progress Notes (Signed)
Neurology Progress Note    Patient ID:  Elvena Oyer  [id number]  48 y.o.  Nov 10, 1960    Subjective:      Patient none..    Current facility-administered medications   Medication Dose Route Frequency   ??? sodium chloride (NS) 0.9 % flush        ??? enoxaparin (LOVENOX) injection 66 mg  1 mg/kg SubCUTAneous Q12H   ??? piperacillin-tazobactam (ZOSYN) 3.375 g in 0.9% sodium chloride (MBP/ADV) 100 mL MBP  3.375 g IntraVENous Q6H   ??? levothyroxine (SYNTHROID) tablet 50 mcg  50 mcg Oral ACB   ??? lactulose (CHRONULAC) solution 20 g  20 g Oral BID   ??? aspirin (ASPIRIN) tablet 325 mg  325 mg Oral DAILY   ??? dextrose 5% with KCl 20 mEq/L infusion    IntraVENous CONTINUOUS   ??? albumin human 5% (BUMINATE) solution 25 g  25 g IntraVENous Q4H PRN   ??? vancomycin (VANCOCIN) 1 g in 0.9% sodium chloride 250 mL IVPB  1,000 mg IntraVENous Q24H   ??? metoprolol tartrate (LOPRESSOR) injection 2.5 mg  2.5 mg IntraVENous Q6H   ??? morphine injection 1 mg  1 mg IntraVENous PRN   ??? pantoprazole (PROTONIX) injection 40 mg  40 mg IntraVENous DAILY   ??? levofloxacin (LEVAQUIN) 750 mg infusion   750 mg IntraVENous Q48H   ??? albuterol/ipratropium (DUONEB) neb solution   1 Dose Nebulization Q4HWA RT   ??? midazolam (VERSED) injection 2-4 mg  2-4 mg IntraVENous Q1H PRN   ??? sodium chloride (NS) flush 10 mL  10 mL InterCATHeter Q8H   ??? sodium chloride (NS) flush 10 mL  10 mL InterCATHeter PRN   ??? heparin (porcine) pf 300 Units  300 Units InterCATHeter PRN   ??? sodium chloride (NS) flush 20 mL  20 mL InterCATHeter PRN   ??? DISCONTD: propofol (DIPRIVAN) infusion  5-50 mcg/kg/min IntraVENous TITRATE   ??? DISCONTD: piperacillin-tazobactam (ZOSYN) 2.25 g in 0.9% sodium chloride (MBP/ADV) 50 mL MBP  2.25 g IntraVENous Q8H   ??? DISCONTD: enoxaparin (LOVENOX) injection 60 mg  60 mg SubCUTAneous Q12H   ??? DISCONTD: lorazepam (ATIVAN) injection 1-2 mg  1-2 mg IntraVENous Q6H PRN   ??? acetaminophen (TYLENOL) suppository 650 mg  650 mg Rectal Q6H PRN    ??? albuterol (PROVENTIL VENTOLIN) nebulizer solution 2.5 mg  2.5 mg Nebulization Q2H PRN   ??? thiamine (B-1) 100 mg in 0.9% sodium chloride 50 mL IVPB  100 mg IntraVENous DAILY   ??? nitroglycerin (NITROBID) 2 % ointment 2 Inch  2 Inch Topical Q6H PRN   ??? saline peripheral flush 5 mL  5 mL InterCATHeter PRN          Review of Systems:    Review of systems not obtained due to patient factors.    Objective:     Patient Vitals in the past 8 hrs:   BP Temp Pulse Resp SpO2   05/07/09 0700 102/51 mmHg - 67  22  100 %   05/07/09 0600 111/52 mmHg - 69  21  97 %   05/07/09 0500 106/50 mmHg - 70  23  100 %   05/07/09 0403 - - 64  - 100 %   05/07/09 0400 103/49 mmHg 99 ??F (37.2 ??C) 65  24  100 %   05/07/09 0300 100/47 mmHg - 65  22  100 %   05/07/09 0200 115/55 mmHg - 74  26  100 %   05/07/09 0100 100/46 mmHg - 78  22  99 %   05/07/09 0010 - - 75  - 99 %   05/07/09 0000 107/50 mmHg 99.9 ??F (37.7 ??C) 76  22  99 %            In: 6099.4 (5069.4 I.V.)  Out: 2595 (2335 Urine)      Lab Review Recent Results (from the past 24 hour(s))   CBC WITH AUTOMATED DIFF    Collection Time    05/07/09  4:00 AM   Component Value Range   ??? WBC 12.2 (*) 3.6 - 11.0 (K/uL)   ??? RBC 3.56 (*) 3.80 - 5.20 (M/uL)   ??? HGB 9.5 (*) 11.5 - 16.0 (g/dL)   ??? HCT 30.9 (*) 35.0 - 47.0 (%)   ??? MCV 86.8  80.0 - 99.0 (FL)   ??? MCH 26.7  26.0 - 34.0 (PG)   ??? MCHC 30.7  30.0 - 36.5 (g/dL)   ??? RDW 19.5 (*) 11.5 - 14.5 (%)   ??? PLATELET 74 (*) 150 - 400 (K/uL)   ??? NEUTROPHILS 79 (*) 32 - 75 (%)   ??? LYMPHOCYTES 11 (*) 12 - 49 (%)   ??? MONOCYTES 7  5 - 13 (%)   ??? EOSINOPHILS 3  0 - 7 (%)   ??? BASOPHILS 0  0 - 1 (%)   ??? ABSOLUTE NEUTS 9.7 (*) 1.8 - 8.0 (K/UL)   ??? ABSOLUTE LYMPHS 1.4  0.8 - 3.5 (K/UL)   ??? ABSOLUTE MONOS 0.8  0.0 - 1.0 (K/UL)   ??? ABSOLUTE EOSINS 0.3  0.0 - 0.4 (K/UL)   ??? ABSOLUTE BASOS 0.0  0.0 - 0.1 (K/UL)   METABOLIC PANEL, COMPREHENSIVE    Collection Time    05/07/09  4:00 AM   Component Value Range   ??? Sodium 155 (*) 136 - 145 (MMOL/L)    ??? Potassium 3.7  3.5 - 5.1 (MMOL/L)   ??? Chloride 125 (*) 97 - 108 (MMOL/L)   ??? CO2 24  21 - 32 (MMOL/L)   ??? Anion gap 6  5 - 15 (mmol/L)   ??? Glucose 160 (*) 65 - 100 (MG/DL)   ??? BUN 40 (*) 6 - 20 (MG/DL)   ??? Creatinine 1.1  0.6 - 1.3 (MG/DL)   ??? BUN/Creatinine ratio 36 (*) 12 - 20 ( )   ??? GFR est AA >60  >60 (ml/min/1.60m2)   ??? GFR est non-AA 56 (*) >60 (ml/min/1.56m2)   ??? Calcium 9.2  8.5 - 10.1 (MG/DL)   ??? Bilirubin, total 4.2 (*) 0.2 - 1.0 (MG/DL)   ??? ALT 99 (*) 12 - 78 (U/L)   ??? AST 78 (*) 15 - 37 (U/L)   ??? Alk. phosphatase 125  50 - 136 (U/L)   ??? Protein, total 6.0 (*) 6.4 - 8.2 (g/dL)   ??? Albumin 2.4 (*) 3.5 - 5.0 (g/dL)   ??? Globulin 3.6  2.0 - 4.0 (g/dL)   ??? A-G Ratio 0.7 (*) 1.1 - 2.2 ( )   MAGNESIUM    Collection Time    05/07/09  4:00 AM   Component Value Range   ??? Magnesium 3.0 (*) 1.6 - 2.4 (MG/DL)   PROTHROMBIN TIME    Collection Time    05/07/09  4:00 AM   Component Value Range   ??? INR 1.7 (*) 0.9 - 1.1 ( )   ??? Prothrombin Time-PT 16.9 (*) 9.0 - 11.0 (SECS)   BLOOD GAS, ARTERIAL    Collection Time  05/07/09  4:10 AM   Component Value Range   ??? pH 7.43  7.35 - 7.45 ( )   ??? PCO2 27 (*) 35 - 45 (mmHg)   ??? PO2 110 (*) 80 - 100 (mmHg)   ??? O2 SAT 98 (*) 92 - 97 (%)   ??? BICARBONATE 18 (*) 22 - 26 (mmol/L)   ??? BASE DEFICIT 5.0 (*) BE NORMAL RANGE -3 T (mmol/L)   ??? FIO2 40  (%)   ??? SITE RR     ??? MODE A/C     ??? SET RATE 10  ( )   ??? SPONTANEOUS RATE 23  ( )   ??? VT/PIP 500  ( )   ??? PEEP/CPAP 5  ( )   ??? SAMPLE SOURCE ARTERIAL     ??? O2 METHOD VENTILATOR           Additional comments:I reviewed the patient's other test results. labs      NEUROLOGICAL EXAM:    Appearance:  The patient is well developed, well nourished, sedated   Mental Status: Sedated and intubated    Cranial Nerves:   unreliable visual fields. Fundi are benign. PERLA, EOM's full, no nystagmus, no ptosis. Facial sensation is normal. Corneal reflexes are intact. Facial movement is symmetric. Palate is midline with normal sternocleidomastoid and trapezius muscles are normal. Tongue is midline.   Motor:  2/5 strength in upper and lower proximal and distal muscles. No posturing noted but bilateral babinski reflexes present. Normal bulk and tone. No fasciculations.   Reflexes:   Deep tendon reflexes 1+/4 and symmetrical.   Sensory:   Withdraws non purposefully to pain   Gait:  Untestable gait.   Tremor:   No tremor noted.   Cerebellar:  No cerebellar signs present.   Neurovascular:  Normal heart sounds and regular rhythm, peripheral pulses intact, and no carotid bruits.         Assessment:     Patient Active Hospital Problem List:  Nausea with vomiting (05/01/2009)    Hypokalemia (05/01/2009)    Hepatic encephalopathy (05/01/2009)    Gait abnormality (05/01/2009)    Alcoholic cirrhosis of liver (05/01/2009)    Hypomagnesemia (05/01/2009)      Plan:   Encephalopathy persists in critically ill Pt due to hepatic and sepsis encephalopathy  No focal findings but Pt more responsive, opens eyes, moves more to pain doesn't follow commands or focus with eyes, but a little lighter in coma.  Will recheck again Monday call if needed over weekend      Signed:  Maisie Fus A. Katrinka Blazing, MD  05/07/2009  7:41 AM

## 2009-05-07 NOTE — Progress Notes (Signed)
PULMONARY/Critical Care/SLEEP MEDICINE ??? [facility]   Name: Yamilka Lopiccolo   DOB: [birth year]   MRN: [id number]   Date: 05/07/2009 9:59 AM   [x]     I have reviewed the flowsheet and previous day???s notes.  [x]    The patient is unable to give any meaningful history or review of systems because the patient is:  [x]    Intubated []       [x]    Sedated    []    Unresponsive      [x]    The patient is critically ill on      [x]    Mechanical ventilation []    Pressors   []    BiPAP []                     WUJ:WJXBJY of systems not obtained due to patient factors.    Vital Signs:    BP 120/52   Pulse 73   Temp 99.8 ??F (37.7 ??C)   Resp 18   Ht 5\' 2"  (1.575 m)   Wt 144 lb 13.5 oz (65.7 kg)   SpO2 100%   PF 65 L/min    O2 Device: Endotracheal tube   O2 Flow Rate (L/min): 2 l/min   Temp (24hrs), Avg:99.2 ??F (37.3 ??C), Min:98.3 ??F (36.8 ??C), Max:99.9 ??F (37.7 ??C)       Intake/Output:   Last shift:      In: 200 (100 I.V.)  Out: 200 (200 Urine)    Last 3 shifts: In: 6099.4 (5069.4 I.V.)  Out: 2595 (2335 Urine)      Intake/Output Summary (Last 24 hours) at 05/07/09 0959  Last data filed at 05/07/09 0900   Gross per 24 hour   Intake   3655 ml   Output   1915 ml   Net   1740 ml       Hemodynamics:   PAP:     Wedge:     CVP:      CO:     CI:     SVR:     PVR:        Ventilator Settings:  Ventilator  Mode: Assist control  Respiratory Rate  Resp Rate Set: 10   Resp Rate Observed: 20   Insp Flow (l/min): 65 l/sec  Insp Rise Time (%): 50 %  I:E Ratio: 1.2.4  Ventilator Volumes  Vt Set (ml): 500 ml  Vt Observed (ml): 526 ml  Vt Spont (ml): 486 ml  Ve Observed (l/min): 14 l/min  Ventilator Pressures  Pressure Support (cm H2O): 20 cm H2O  PIP Observed (cm H2O): 31 cm H2O  Plateau Pressure (cm H2O): 23 cm H2O  MAP (cm H2O): 14   PEEP/CPAP (cm H2O): 5 cm H20    Physical Exam:    General: [x]    Intubated/sedated []    No acute distress []        [x]    Ill appearing []     []           HEENT: []    Icteric [x]    PERRL []         [x]    Anicteric Mucosa:[x]    moist   []    dry []          Resp: []    Wheeze []    Clear to ascultation bilaterally []    Accessory muscle use    [x]    Rales []    Chest tube:  []    Air leak []        []   Ronchi []    Crepitus []       [x]    Coarse bilateral ventilator breath sounds      CV: [x]    Regular []    Tachycardia []        []    Irregular []    Bradycardic []        []    Murmur []    S3 []        []    Edema []    S4 []          GI: [x]    Soft  [x]    NG Tube Bowel sounds: [x]    present []    absent    []    Firm []    PEG []        []    Distended  []          GU: [x]    Foley []    Hematuria []        [x]    Clear urine []    Edema []          MSK:    []    SCD???s []    Edema []        [x]    No deformities []     []          Skin: [x]    Warm [x]    Dry  []        []    Cool []    Moist []        []    Hot  []    Diaphoretic []        []    Rash  []    Cyanosis []          N-psych: [x]    Sedated  []    Agitated []        []    Alert  Follows commands:   []    Yes  [x]    No []          Devices: [x]    ET-Tube []    Tracheostomy []    Chest tube: Air leak? []    Y/[]    N    []    Central Line []    PA Catheter []        []    PICC []     []          DATA:   Current facility-administered medications   Medication Dose Route Frequency   ??? sodium chloride (NS) 0.9 % flush        ??? piperacillin-tazobactam (ZOSYN) 3.375 g in 0.9% sodium chloride (MBP/ADV) 100 mL MBP  3.375 g IntraVENous Q6H   ??? enoxaparin (LOVENOX) injection 70 mg  70 mg SubCUTAneous Q12H   ??? DISCONTD: enoxaparin (LOVENOX) injection 66 mg  1 mg/kg SubCUTAneous Q12H   ??? levothyroxine (SYNTHROID) tablet 50 mcg  50 mcg Oral ACB   ??? lactulose (CHRONULAC) solution 20 g  20 g Oral BID   ??? aspirin (ASPIRIN) tablet 325 mg  325 mg Oral DAILY   ??? dextrose 5% with KCl 20 mEq/L infusion    IntraVENous CONTINUOUS   ??? albumin human 5% (BUMINATE) solution 25 g  25 g IntraVENous Q4H PRN   ??? vancomycin (VANCOCIN) 1 g in 0.9% sodium chloride 250 mL IVPB  1,000 mg IntraVENous Q24H    ??? metoprolol tartrate (LOPRESSOR) injection 2.5 mg  2.5 mg IntraVENous Q6H   ??? morphine injection 1 mg  1 mg IntraVENous PRN   ??? pantoprazole (PROTONIX) injection 40 mg  40 mg IntraVENous DAILY   ??? levofloxacin (LEVAQUIN) 750 mg infusion   750 mg IntraVENous Q48H   ???  albuterol/ipratropium (DUONEB) neb solution   1 Dose Nebulization Q4HWA RT   ??? midazolam (VERSED) injection 2-4 mg  2-4 mg IntraVENous Q1H PRN   ??? sodium chloride (NS) flush 10 mL  10 mL InterCATHeter Q8H   ??? sodium chloride (NS) flush 10 mL  10 mL InterCATHeter PRN   ??? heparin (porcine) pf 300 Units  300 Units InterCATHeter PRN   ??? sodium chloride (NS) flush 20 mL  20 mL InterCATHeter PRN   ??? DISCONTD: propofol (DIPRIVAN) infusion  5-50 mcg/kg/min IntraVENous TITRATE   ??? DISCONTD: piperacillin-tazobactam (ZOSYN) 2.25 g in 0.9% sodium chloride (MBP/ADV) 50 mL MBP  2.25 g IntraVENous Q8H   ??? DISCONTD: lorazepam (ATIVAN) injection 1-2 mg  1-2 mg IntraVENous Q6H PRN   ??? acetaminophen (TYLENOL) suppository 650 mg  650 mg Rectal Q6H PRN   ??? albuterol (PROVENTIL VENTOLIN) nebulizer solution 2.5 mg  2.5 mg Nebulization Q2H PRN   ??? thiamine (B-1) 100 mg in 0.9% sodium chloride 50 mL IVPB  100 mg IntraVENous DAILY   ??? nitroglycerin (NITROBID) 2 % ointment 2 Inch  2 Inch Topical Q6H PRN   ??? saline peripheral flush 5 mL  5 mL InterCATHeter PRN         Telemetry: [x]    Sinus []    A-flutter []    Paced    []    A-fib []    Multiple PVC???s                  Labs:  Recent Labs   Basename 05/07/09 0400 05/06/09 0445 05/05/09 0530   ??? WBC 12.2* 12.1* 12.0*   ??? HGB 9.5* 9.4* 10.0*   ??? HCT 30.9* 30.1* 32.5*   ??? PLT 74* 86* 94*       Recent Labs   Basename 05/07/09 0400 05/06/09 0445 05/05/09 0530   ??? NA 155* 156* 159*   ??? K 3.7 3.5 3.1*   ??? CL 125* 123* 125*   ??? CO2 24 23 24    ??? GLU 160* 195* 165*   ??? BUN 40* 51* 44*   ??? CREA 1.1 1.5* 1.8*   ??? CA 9.2 8.8 8.2*   ??? MG 3.0* -- 2.6*   ??? PHOS -- -- 3.0   ??? ALB 2.4* 2.5* 2.4*   ??? TBIL 4.2* 4.4* 4.5*   ??? SGOT 78* 101* 170*    ??? INR 1.7* -- 3.0*       Recent Labs   Basename 05/07/09 0410 05/06/09 0403 05/05/09 0409   ??? PH 7.43 7.41 7.46*   ??? PCO2 27* 33* 31*   ??? PO2 110* 104* 72*   ??? HCO3 18* 21* 21*   ??? FIO2 40 50 50         Imaging:  [x]    I have personally reviewed the patient???s radiographs  []    Radiographs reviewed with radiologist   [x]    No change from prior, tubes and lines in adequate position  []    Improved   []    Worsening     IMPRESSION:     The patient is: [x]    acutely ill Risk of deterioration: []    moderate    []    critically ill  [x]    high     Patient Active Hospital Problem List:  Nausea with vomiting (05/01/2009)    Hypokalemia (05/01/2009)    Hepatic encephalopathy (05/01/2009)    Gait abnormality (05/01/2009)    Alcoholic cirrhosis of liver (05/01/2009)    Hypomagnesemia (05/01/2009)      ??  PLAN:  1. Adjust vent settings  2. Cont iv abx  3. Jet nebs  4. PUD/DVT prophylaxis  5. NPO  6. Fully sedated     [x]    See my orders for details    My assessment/plan was discussed with:  [x]    nursing []    PT/OT    []    respiratory therapy []    Dr.   []    family []         []    Total critical care time exclusive of procedures       minutes  Vickki Muff, MD

## 2009-05-07 NOTE — Progress Notes (Signed)
Cardiology Progress Note      05/07/2009 10:13 AM    Admit Date: 04/29/2009        Subjective:     Remains critically ill--intubated, sedated on vent.  Hemodynamically stable.    BP 120/52   Pulse 73   Temp 99.8 ??F (37.7 ??C)   Resp 18   Ht 5\' 2"  (1.575 m)   Wt 144 lb 13.5 oz (65.7 kg)   SpO2 100%   PF 65 L/min  Current facility-administered medications   Medication Dose Route Frequency   ??? sodium chloride (NS) 0.9 % flush        ??? piperacillin-tazobactam (ZOSYN) 3.375 g in 0.9% sodium chloride (MBP/ADV) 100 mL MBP  3.375 g IntraVENous Q6H   ??? enoxaparin (LOVENOX) injection 70 mg  70 mg SubCUTAneous Q12H   ??? dextrose 5% with KCl 20 mEq/L infusion    IntraVENous CONTINUOUS   ??? DISCONTD: enoxaparin (LOVENOX) injection 66 mg  1 mg/kg SubCUTAneous Q12H   ??? levothyroxine (SYNTHROID) tablet 50 mcg  50 mcg Oral ACB   ??? lactulose (CHRONULAC) solution 20 g  20 g Oral BID   ??? aspirin (ASPIRIN) tablet 325 mg  325 mg Oral DAILY   ??? albumin human 5% (BUMINATE) solution 25 g  25 g IntraVENous Q4H PRN   ??? vancomycin (VANCOCIN) 1 g in 0.9% sodium chloride 250 mL IVPB  1,000 mg IntraVENous Q24H   ??? metoprolol tartrate (LOPRESSOR) injection 2.5 mg  2.5 mg IntraVENous Q6H   ??? DISCONTD: dextrose 5% with KCl 20 mEq/L infusion    IntraVENous CONTINUOUS   ??? morphine injection 1 mg  1 mg IntraVENous PRN   ??? pantoprazole (PROTONIX) injection 40 mg  40 mg IntraVENous DAILY   ??? levofloxacin (LEVAQUIN) 750 mg infusion   750 mg IntraVENous Q48H   ??? albuterol/ipratropium (DUONEB) neb solution   1 Dose Nebulization Q4HWA RT   ??? midazolam (VERSED) injection 2-4 mg  2-4 mg IntraVENous Q1H PRN   ??? sodium chloride (NS) flush 10 mL  10 mL InterCATHeter Q8H   ??? sodium chloride (NS) flush 10 mL  10 mL InterCATHeter PRN   ??? heparin (porcine) pf 300 Units  300 Units InterCATHeter PRN   ??? sodium chloride (NS) flush 20 mL  20 mL InterCATHeter PRN   ??? DISCONTD: propofol (DIPRIVAN) infusion  5-50 mcg/kg/min IntraVENous TITRATE    ??? DISCONTD: piperacillin-tazobactam (ZOSYN) 2.25 g in 0.9% sodium chloride (MBP/ADV) 50 mL MBP  2.25 g IntraVENous Q8H   ??? DISCONTD: lorazepam (ATIVAN) injection 1-2 mg  1-2 mg IntraVENous Q6H PRN   ??? acetaminophen (TYLENOL) suppository 650 mg  650 mg Rectal Q6H PRN   ??? albuterol (PROVENTIL VENTOLIN) nebulizer solution 2.5 mg  2.5 mg Nebulization Q2H PRN   ??? thiamine (B-1) 100 mg in 0.9% sodium chloride 50 mL IVPB  100 mg IntraVENous DAILY   ??? nitroglycerin (NITROBID) 2 % ointment 2 Inch  2 Inch Topical Q6H PRN   ??? saline peripheral flush 5 mL  5 mL InterCATHeter PRN           Objective:      Physical Exam:  Intubated, sedated on vent  Chest--clear  CV--rrr Nl S1S2 I/VI sem  Abd--benign  Ext--unchanged      Data Review:   Labs:  Recent Results (from the past 24 hour(s))   CBC WITH AUTOMATED DIFF    Collection Time    05/07/09  4:00 AM   Component Value Range   ???  WBC 12.2 (*) 3.6 - 11.0 (K/uL)   ??? RBC 3.56 (*) 3.80 - 5.20 (M/uL)   ??? HGB 9.5 (*) 11.5 - 16.0 (g/dL)   ??? HCT 30.9 (*) 35.0 - 47.0 (%)   ??? MCV 86.8  80.0 - 99.0 (FL)   ??? MCH 26.7  26.0 - 34.0 (PG)   ??? MCHC 30.7  30.0 - 36.5 (g/dL)   ??? RDW 19.5 (*) 11.5 - 14.5 (%)   ??? PLATELET 74 (*) 150 - 400 (K/uL)   ??? NEUTROPHILS 79 (*) 32 - 75 (%)   ??? LYMPHOCYTES 11 (*) 12 - 49 (%)   ??? MONOCYTES 7  5 - 13 (%)   ??? EOSINOPHILS 3  0 - 7 (%)   ??? BASOPHILS 0  0 - 1 (%)   ??? ABSOLUTE NEUTS 9.7 (*) 1.8 - 8.0 (K/UL)   ??? ABSOLUTE LYMPHS 1.4  0.8 - 3.5 (K/UL)   ??? ABSOLUTE MONOS 0.8  0.0 - 1.0 (K/UL)   ??? ABSOLUTE EOSINS 0.3  0.0 - 0.4 (K/UL)   ??? ABSOLUTE BASOS 0.0  0.0 - 0.1 (K/UL)   METABOLIC PANEL, COMPREHENSIVE    Collection Time    05/07/09  4:00 AM   Component Value Range   ??? Sodium 155 (*) 136 - 145 (MMOL/L)   ??? Potassium 3.7  3.5 - 5.1 (MMOL/L)   ??? Chloride 125 (*) 97 - 108 (MMOL/L)   ??? CO2 24  21 - 32 (MMOL/L)   ??? Anion gap 6  5 - 15 (mmol/L)   ??? Glucose 160 (*) 65 - 100 (MG/DL)   ??? BUN 40 (*) 6 - 20 (MG/DL)   ??? Creatinine 1.1  0.6 - 1.3 (MG/DL)    ??? BUN/Creatinine ratio 36 (*) 12 - 20 ( )   ??? GFR est AA >60  >60 (ml/min/1.45m2)   ??? GFR est non-AA 56 (*) >60 (ml/min/1.52m2)   ??? Calcium 9.2  8.5 - 10.1 (MG/DL)   ??? Bilirubin, total 4.2 (*) 0.2 - 1.0 (MG/DL)   ??? ALT 99 (*) 12 - 78 (U/L)   ??? AST 78 (*) 15 - 37 (U/L)   ??? Alk. phosphatase 125  50 - 136 (U/L)   ??? Protein, total 6.0 (*) 6.4 - 8.2 (g/dL)   ??? Albumin 2.4 (*) 3.5 - 5.0 (g/dL)   ??? Globulin 3.6  2.0 - 4.0 (g/dL)   ??? A-G Ratio 0.7 (*) 1.1 - 2.2 ( )   MAGNESIUM    Collection Time    05/07/09  4:00 AM   Component Value Range   ??? Magnesium 3.0 (*) 1.6 - 2.4 (MG/DL)   PROTHROMBIN TIME    Collection Time    05/07/09  4:00 AM   Component Value Range   ??? INR 1.7 (*) 0.9 - 1.1 ( )   ??? Prothrombin Time-PT 16.9 (*) 9.0 - 11.0 (SECS)   BLOOD GAS, ARTERIAL    Collection Time    05/07/09  4:10 AM   Component Value Range   ??? pH 7.43  7.35 - 7.45 ( )   ??? PCO2 27 (*) 35 - 45 (mmHg)   ??? PO2 110 (*) 80 - 100 (mmHg)   ??? O2 SAT 98 (*) 92 - 97 (%)   ??? BICARBONATE 18 (*) 22 - 26 (mmol/L)   ??? BASE DEFICIT 5.0 (*) BE NORMAL RANGE -3 T (mmol/L)   ??? FIO2 40  (%)   ??? SITE RR     ???  MODE A/C     ??? SET RATE 10  ( )   ??? SPONTANEOUS RATE 23  ( )   ??? VT/PIP 500  ( )   ??? PEEP/CPAP 5  ( )   ??? SAMPLE SOURCE ARTERIAL     ??? O2 METHOD VENTILATOR           Telemetry: normal sinus rhythm      Assessment:     Patient Active Hospital Problem List:  Nausea with vomiting (05/01/2009)    Hypokalemia (05/01/2009)    Hepatic encephalopathy (05/01/2009)    Gait abnormality (05/01/2009)    Alcoholic cirrhosis of liver (05/01/2009)    Hypomagnesemia (05/01/2009)      Plan:     Continue supportive rx  ASA, beta blocker, lovenox  Continue vent support        Ellwood Sayers, MD

## 2009-05-08 LAB — CBC WITH AUTOMATED DIFF
ABS. BASOPHILS: 0 10*3/uL (ref 0.0–0.1)
ABS. EOSINOPHILS: 0.3 10*3/uL (ref 0.0–0.4)
ABS. LYMPHOCYTES: 1.4 10*3/uL (ref 0.8–3.5)
ABS. MONOCYTES: 0.9 10*3/uL (ref 0.0–1.0)
ABS. NEUTROPHILS: 7.7 10*3/uL (ref 1.8–8.0)
BASOPHILS: 0 % (ref 0–1)
EOSINOPHILS: 3 % (ref 0–7)
HCT: 28.5 % — ABNORMAL LOW (ref 35.0–47.0)
HGB: 8.5 g/dL — ABNORMAL LOW (ref 11.5–16.0)
LYMPHOCYTES: 14 % (ref 12–49)
MCH: 26.2 PG (ref 26.0–34.0)
MCHC: 29.8 g/dL — ABNORMAL LOW (ref 30.0–36.5)
MCV: 88 FL (ref 80.0–99.0)
MONOCYTES: 9 % (ref 5–13)
NEUTROPHILS: 74 % (ref 32–75)
PLATELET: 65 10*3/uL — ABNORMAL LOW (ref 150–400)
RBC: 3.24 M/uL — ABNORMAL LOW (ref 3.80–5.20)
RDW: 19.5 % — ABNORMAL HIGH (ref 11.5–14.5)
WBC: 10.3 10*3/uL (ref 3.6–11.0)

## 2009-05-08 LAB — METABOLIC PANEL, COMPREHENSIVE
A-G Ratio: 0.6 — ABNORMAL LOW (ref 1.1–2.2)
ALT (SGPT): 83 U/L — ABNORMAL HIGH (ref 12–78)
AST (SGOT): 54 U/L — ABNORMAL HIGH (ref 15–37)
Albumin: 2.1 g/dL — ABNORMAL LOW (ref 3.5–5.0)
Alk. phosphatase: 105 U/L (ref 50–136)
Anion gap: 13 mmol/L (ref 5–15)
BUN/Creatinine ratio: 26 — ABNORMAL HIGH (ref 12–20)
BUN: 29 MG/DL — ABNORMAL HIGH (ref 6–20)
Bilirubin, total: 3.8 MG/DL — ABNORMAL HIGH (ref 0.2–1.0)
CO2: 21 MMOL/L (ref 21–32)
Calcium: 8 MG/DL — ABNORMAL LOW (ref 8.5–10.1)
Chloride: 126 MMOL/L — ABNORMAL HIGH (ref 97–108)
Creatinine: 1.1 MG/DL (ref 0.6–1.3)
GFR est AA: >60 mL/min/{1.73_m2} (ref >60)
GFR est non-AA: 56 mL/min/{1.73_m2} — ABNORMAL LOW (ref >60)
Globulin: 3.5 g/dL (ref 2.0–4.0)
Glucose: 95 MG/DL (ref 65–100)
Potassium: 3.6 MMOL/L (ref 3.5–5.1)
Protein, total: 5.6 g/dL — ABNORMAL LOW (ref 6.4–8.2)
Sodium: 160 MMOL/L — ABNORMAL HIGH (ref 136–145)

## 2009-05-08 LAB — BLOOD GAS ARTERIAL,VENT
BASE DEFICIT: 2.6 mmol/L — AB
BICARBONATE: 21 mmol/L — ABNORMAL LOW (ref 22–26)
FIO2: 40 %
O2 SAT: 99 % — ABNORMAL HIGH (ref 92–97)
PCO2: 33 mmHg — ABNORMAL LOW (ref 35–45)
PEEP/CPAP: 5
PO2: 129 mmHg — ABNORMAL HIGH (ref 80–100)
SET RATE: 10
VT/PIP: 500
pH: 7.42 (ref 7.35–7.45)

## 2009-05-08 MED ADMIN — albuterol/ipratropium (DUONEB) neb solution: RESPIRATORY_TRACT | @ 12:00:00 | NDC 00487980101

## 2009-05-08 MED ADMIN — piperacillin-tazobactam (ZOSYN) 3.375 g in 0.9% sodium chloride (MBP/ADV) 100 mL MBP: INTRAVENOUS | @ 10:00:00 | NDC 00338055318

## 2009-05-08 MED ADMIN — lactulose (CHRONULAC) solution 20 g: ORAL | @ 14:00:00 | NDC 00121457730

## 2009-05-08 MED ADMIN — vancomycin (VANCOCIN) 1 g in 0.9% sodium chloride 250 mL IVPB: INTRAVENOUS | @ 23:00:00 | NDC 63323031461

## 2009-05-08 MED ADMIN — albuterol/ipratropium (DUONEB) neb solution: RESPIRATORY_TRACT | @ 20:00:00 | NDC 00487980101

## 2009-05-08 MED ADMIN — metoprolol tartrate (LOPRESSOR) injection 2.5 mg: INTRAVENOUS | @ 05:00:00 | NDC 00143987310

## 2009-05-08 MED ADMIN — metoprolol tartrate (LOPRESSOR) injection 2.5 mg: INTRAVENOUS | @ 17:00:00 | NDC 00143987310

## 2009-05-08 MED ADMIN — pantoprazole (PROTONIX) injection 40 mg: INTRAVENOUS | @ 14:00:00 | NDC 00409488810

## 2009-05-08 MED ADMIN — aspirin (ASPIRIN) tablet 325 mg: ORAL | @ 14:00:00 | NDC 00904200960

## 2009-05-08 MED ADMIN — dextrose 5% with KCl 20 mEq/L infusion: INTRAVENOUS | @ 05:00:00 | NDC 00409790509

## 2009-05-08 MED ADMIN — piperacillin-tazobactam (ZOSYN) 3.375 g in 0.9% sodium chloride (MBP/ADV) 100 mL MBP: INTRAVENOUS | @ 23:00:00 | NDC 00338055318

## 2009-05-08 MED ADMIN — sodium chloride (NS) flush 10 mL: @ 01:00:00 | NDC 87701099893

## 2009-05-08 MED ADMIN — sodium chloride (NS) flush 10 mL: @ 17:00:00 | NDC 87701099893

## 2009-05-08 MED ADMIN — piperacillin-tazobactam (ZOSYN) 3.375 g in 0.9% sodium chloride (MBP/ADV) 100 mL MBP: INTRAVENOUS | @ 17:00:00 | NDC 00338055318

## 2009-05-08 MED ADMIN — metoprolol tartrate (LOPRESSOR) injection 2.5 mg: INTRAVENOUS | @ 10:00:00 | NDC 00143987310

## 2009-05-08 MED ADMIN — piperacillin-tazobactam (ZOSYN) 3.375 g in 0.9% sodium chloride (MBP/ADV) 100 mL MBP: INTRAVENOUS | @ 05:00:00 | NDC 00338055318

## 2009-05-08 MED ADMIN — levothyroxine (SYNTHROID) tablet 50 mcg: ORAL | @ 14:00:00 | NDC 51079044401

## 2009-05-08 MED ADMIN — acetaminophen (TYLENOL) suppository 650 mg: RECTAL | @ 14:00:00 | NDC 00713016550

## 2009-05-08 MED ADMIN — dextrose 5% infusion: INTRAVENOUS | @ 19:00:00 | NDC 82468011674

## 2009-05-08 MED ADMIN — VANCOMYCIN TROUGH LEVEL: @ 21:00:00 | NDC 51754010204

## 2009-05-08 MED ADMIN — midazolam (VERSED) injection 2-4 mg: INTRAVENOUS | @ 17:00:00 | NDC 10019002837

## 2009-05-08 MED ADMIN — metoprolol tartrate (LOPRESSOR) injection 2.5 mg: INTRAVENOUS | @ 23:00:00 | NDC 00143987310

## 2009-05-08 MED ADMIN — lactulose (CHRONULAC) solution 20 g: ORAL | @ 02:00:00 | NDC 00121457730

## 2009-05-08 MED ADMIN — enoxaparin (LOVENOX) injection 70 mg: SUBCUTANEOUS | @ 01:00:00 | NDC 00075062430

## 2009-05-08 MED ADMIN — midazolam (VERSED) injection 2-4 mg: INTRAVENOUS | @ 23:00:00 | NDC 10019002837

## 2009-05-08 MED ADMIN — enoxaparin (LOVENOX) injection 70 mg: SUBCUTANEOUS | @ 14:00:00 | NDC 00075062430

## 2009-05-08 MED ADMIN — thiamine (B-1) 100 mg in 0.9% sodium chloride 50 mL IVPB: INTRAVENOUS | @ 14:00:00 | NDC 63323001302

## 2009-05-08 MED ADMIN — sodium chloride (NS) flush 10 mL: @ 10:00:00 | NDC 87701099893

## 2009-05-08 MED ADMIN — albuterol/ipratropium (DUONEB) neb solution: RESPIRATORY_TRACT | @ 16:00:00 | NDC 00487980101

## 2009-05-08 MED FILL — ZOSYN 3.375 GRAM INTRAVENOUS SOLUTION: 3.375 gram | INTRAVENOUS | Qty: 3.38

## 2009-05-08 MED FILL — THIAMINE 100 MG/ML INJECTION: 100 mg/mL | INTRAMUSCULAR | Qty: 1

## 2009-05-08 MED FILL — IPRATROPIUM BROMIDE 0.02 % SOLN FOR INHALATION: 0.02 % | RESPIRATORY_TRACT | Qty: 7.5

## 2009-05-08 MED FILL — MIDAZOLAM 1 MG/ML IJ SOLN: 1 mg/mL | INTRAMUSCULAR | Qty: 5

## 2009-05-08 MED FILL — IPRATROPIUM BROMIDE 0.02 % SOLN FOR INHALATION: 0.02 % | RESPIRATORY_TRACT | Qty: 2.5

## 2009-05-08 MED FILL — DEXTROSE 5% IN WATER (D5W) IV: INTRAVENOUS | Qty: 1000

## 2009-05-08 MED FILL — VANCOMYCIN 10 GRAM IV SOLR: 10 gram | INTRAVENOUS | Qty: 1

## 2009-05-08 MED FILL — LEVOTHYROXINE 25 MCG TAB: 25 mcg | ORAL | Qty: 2

## 2009-05-08 MED FILL — METOPROLOL TARTRATE 5 MG/5 ML IV SOLN: 5 mg/ mL | INTRAVENOUS | Qty: 5

## 2009-05-08 MED FILL — LOVENOX 30 MG/0.3 ML SUB-Q SYRINGE: 30 mg/0.3 mL | SUBCUTANEOUS | Qty: 1

## 2009-05-08 MED FILL — ASPIRIN 325 MG TAB: 325 mg | ORAL | Qty: 1

## 2009-05-08 MED FILL — LACTULOSE 10 GRAM/15 ML ORAL SOLN: 10 gram/15 mL | ORAL | Qty: 30

## 2009-05-08 MED FILL — ACEPHEN 650 MG RECTAL SUPPOSITORY: 650 mg | RECTAL | Qty: 1

## 2009-05-08 MED FILL — SODIUM CHLORIDE 0.9 % INJECTION: INTRAMUSCULAR | Qty: 10

## 2009-05-08 MED FILL — SALINE FLUSH INJECTION SYRINGE: INTRAMUSCULAR | Qty: 40

## 2009-05-08 MED FILL — D5W WITH POTASSIUM CHLORIDE 20 MEQ/L IV: 20 mEq/L | INTRAVENOUS | Qty: 1000

## 2009-05-08 MED FILL — SALINE FLUSH INJECTION SYRINGE: INTRAMUSCULAR | Qty: 30

## 2009-05-08 MED FILL — SALINE FLUSH INJECTION SYRINGE: INTRAMUSCULAR | Qty: 10

## 2009-05-08 NOTE — Progress Notes (Signed)
 Formatting of this note might be different from the original.  Tube feeding started.  Electronically signed by Carlyon Lye L at 05/08/2009  8:35 PM EDT

## 2009-05-08 NOTE — Progress Notes (Signed)
 Formatting of this note is different from the original.  Images from the original note were not included.        [facility] - Physician Progress Note  Pulmonary, Critical Care and Sleep Medicine  Name: Breanna Lester DOB:  1961-02-09 MRN:  [id number] Date:  05/08/2009 2:30 PM    I have reviewed the flow sheet and previous notes  COMPLAINTS:   Mechanically ventilated, sedated  ROS: As mentioned in complaints, all other comprehensive review of systems is limited    EXAMINATION:  Vital Signs:         BP 101/48  Pulse 72  Temp 99.7 F (37.6 C)  Resp 21  Ht 5' 2 (1.575 m)  Wt 144 lb 13.5 oz (65.7 kg)  SpO2 100%  PF 40 L/min O2 Flow Rate (L/min): 2 l/min O2 Device: Endotracheal tube Temp (24hrs), Avg:99.1 F (37.3 C), Min:98.2 F (36.8 C), Max:100.4 F (38 C)    Intake/Output:     In: 200 Out: 300 (300 Urine)   In: 5649.8 (5019.8 I.V.)  Out: 2875 (2275 Urine)     Intake/Output Summary (Last 24 hours) at 05/08/09 1430  Last data filed at 05/08/09 1200   Gross per 24 hour   Intake 2386.5 ml   Output   1880 ml   Net  506.5 ml     General: No acute distress, not using accessory muscles of respiration  Head & Neck:+ Pallor/ icterus/ no cyanosis/ LAD/ JVD. Oral mucosa moist, PERRL  Chest:  Symmetrical coarse BS, no egophony/ rub/ crepts/ wheeze  Heart:  RRR, no gallops/ rub/ murmur  Abdomen: Soft, non-tender, no organomegaly, bowel sounds normoactive  Extremities: No clubbing/ cyanosis/ edema  Skin:  Warm, no rash  Neuro/ psych: Sedated    LABS AND  DATA: Personally reviewed  VENT: 10/500/40%, peep 5  CBC:10.3, 8.5, 65  BMP:160/3.6/126/21/29/1.1/95  ABG: 7.42/33/129  Cultures: reviewed  CXR: Patchy alveolar infiltrates  Tele: SR    MEDICATIONS: Reviewed for changes    ASSESSMENT:    Sepsis syndrome :underlying pneumonia, fever, leukocytosis  Broad spectrum antibiotics, GB sludge  Acute respiratory failure:intubated with underlying Pneumonia in ICU  Altered mental status : multifactorial  Sepsis, hypernatremia, ARF, hyperamonemia   Hypernatremia: worsening, c/w free water  thru NGT  Pneumonia: c/w levaquin , zosyn and Vancomycin  Nausea with vomiting (05/01/2009) resolved, negative KUB and US   Hypokalemia (05/01/2009)   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,c/w lactulose thru NGT  Gait abnormality (05/01/2009) B12, folate normal, PT/OT when MS improved  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 CCT exclusive of procedures:35'     Thank you for allowing us  to participate in the care of this patient.    Kasey Setting, MD    Kasey Setting, MD, [facility]  [facility]         Electronically signed by Setting Kasey, MD at 05/08/2009  2:37 PM EDT

## 2009-05-08 NOTE — Progress Notes (Signed)
 Formatting of this note is different from the original.  Cardiology Progress Note    05/08/2009 10:13 AM    Admit Date: 04/29/2009    Subjective:     Breanna Lester remains intubated, sedated on vent.  Hemodynamically stable.    BP 112/46  Pulse 79  Temp 100.4 F (38 C)  Resp 23  Ht 5' 2 (1.575 m)  Wt 144 lb 13.5 oz (65.7 kg)  SpO2 99%  PF 65 L/min  Current facility-administered medications   Medication Dose Route Frequency   ? piperacillin-tazobactam (ZOSYN) 3.375 g in 0.9% sodium chloride  (MBP/ADV) 100 mL MBP  3.375 g IntraVENous Q6H   ? enoxaparin  (LOVENOX ) injection 70 mg  70 mg SubCUTAneous Q12H   ? VANCOMYCIN TROUGH LEVEL  1 Each Does Not Apply ONCE   ? DISCONTD: dextrose 5% with KCl 20 mEq/L infusion    IntraVENous CONTINUOUS   ? DISCONTD: vancomycin (VANCOCIN) 1 g in 0.9% sodium chloride  250 mL infusion  1 g IntraVENous ONCE   ? levothyroxine (SYNTHROID) tablet 50 mcg  50 mcg Oral ACB   ? lactulose (CHRONULAC) solution 20 g  20 g Oral BID   ? aspirin (ASPIRIN) tablet 325 mg  325 mg Oral DAILY   ? albumin human 5% (BUMINATE) solution 25 g  25 g IntraVENous Q4H PRN   ? vancomycin (VANCOCIN) 1 g in 0.9% sodium chloride  250 mL IVPB  1,000 mg IntraVENous Q24H   ? metoprolol  tartrate (LOPRESSOR ) injection 2.5 mg  2.5 mg IntraVENous Q6H   ? DISCONTD: dextrose 5% with KCl 20 mEq/L infusion    IntraVENous CONTINUOUS   ? morphine  injection 1 mg  1 mg IntraVENous PRN   ? pantoprazole  (PROTONIX ) injection 40 mg  40 mg IntraVENous DAILY   ? levofloxacin (LEVAQUIN) 750 mg infusion   750 mg IntraVENous Q48H   ? albuterol /ipratropium (DUONEB ) neb solution   1 Dose Nebulization Q4HWA RT   ? midazolam (VERSED) injection 2-4 mg  2-4 mg IntraVENous Q1H PRN   ? sodium chloride  (NS) flush 10 mL  10 mL InterCATHeter Q8H   ? sodium chloride  (NS) flush 10 mL  10 mL InterCATHeter PRN   ? heparin (porcine) pf 300 Units  300 Units InterCATHeter PRN   ? sodium chloride  (NS) flush 20 mL  20 mL InterCATHeter PRN   ? acetaminophen  (TYLENOL )  suppository 650 mg  650 mg Rectal Q6H PRN   ? albuterol  (PROVENTIL  VENTOLIN ) nebulizer solution 2.5 mg  2.5 mg Nebulization Q2H PRN   ? thiamine (B-1) 100 mg in 0.9% sodium chloride  50 mL IVPB  100 mg IntraVENous DAILY   ? nitroglycerin (NITROBID) 2 % ointment 2 Inch  2 Inch Topical Q6H PRN   ? saline peripheral flush 5 mL  5 mL InterCATHeter PRN         Objective:     Physical Exam:  Intubated, sedated on vent  Chest--clear  CV--rrr Nl S1S2 I/VI sem  Abd--benign  Ext--unchanged    Data Review:   Labs:  Recent Results (from the past 24 hour(s))   CBC WITH AUTOMATED DIFF    Collection Time    05/08/09  4:15 AM   Component Value Range   ? WBC 10.3  3.6 - 11.0 (K/uL)   ? RBC 3.24 (*) 3.80 - 5.20 (M/uL)   ? HGB 8.5 (*) 11.5 - 16.0 (g/dL)   ? HCT 28.5 (*) 35.0 - 47.0 (%)   ? MCV 88.0  80.0 - 99.0 (FL)   ?  MCH 26.2  26.0 - 34.0 (PG)   ? MCHC 29.8 (*) 30.0 - 36.5 (g/dL)   ? RDW 19.5 (*) 11.5 - 14.5 (%)   ? PLATELET 65 (*) 150 - 400 (K/uL)   ? NEUTROPHILS 74  32 - 75 (%)   ? LYMPHOCYTES 14  12 - 49 (%)   ? MONOCYTES 9  5 - 13 (%)   ? EOSINOPHILS 3  0 - 7 (%)   ? BASOPHILS 0  0 - 1 (%)   ? ABSOLUTE NEUTS 7.7  1.8 - 8.0 (K/UL)   ? ABSOLUTE LYMPHS 1.4  0.8 - 3.5 (K/UL)   ? ABSOLUTE MONOS 0.9  0.0 - 1.0 (K/UL)   ? ABSOLUTE EOSINS 0.3  0.0 - 0.4 (K/UL)   ? ABSOLUTE BASOS 0.0  0.0 - 0.1 (K/UL)   ? RBC COMMENTS 1+ ANISOCYTOSIS     ? DF SMEAR SCANNED     METABOLIC PANEL, COMPREHENSIVE    Collection Time    05/08/09  4:15 AM   Component Value Range   ? Sodium 160 (*) 136 - 145 (MMOL/L)   ? Potassium 3.6  3.5 - 5.1 (MMOL/L)   ? Chloride 126 (*) 97 - 108 (MMOL/L)   ? CO2 21  21 - 32 (MMOL/L)   ? Anion gap 13  5 - 15 (mmol/L)   ? Glucose 95  65 - 100 (MG/DL)   ? BUN 29 (*) 6 - 20 (MG/DL)   ? Creatinine 1.1  0.6 - 1.3 (MG/DL)   ? BUN/Creatinine ratio 26 (*) 12 - 20 ( )   ? GFR est AA >60  >60 (ml/min/1.8m2)   ? GFR est non-AA 56 (*) >60 (ml/min/1.53m2)   ? Calcium 8.0 (*) 8.5 - 10.1 (MG/DL)   ? Bilirubin, total 3.8 (*) 0.2 - 1.0 (MG/DL)    ? ALT 83 (*) 12 - 78 (U/L)   ? AST 54 (*) 15 - 37 (U/L)   ? Alk. phosphatase 105  50 - 136 (U/L)   ? Protein, total 5.6 (*) 6.4 - 8.2 (g/dL)   ? Albumin 2.1 (*) 3.5 - 5.0 (g/dL)   ? Globulin 3.5  2.0 - 4.0 (g/dL)   ? A-G Ratio 0.6 (*) 1.1 - 2.2 ( )   BLOOD GAS, ARTERIAL    Collection Time    05/08/09  4:38 AM   Component Value Range   ? pH 7.42  7.35 - 7.45 ( )   ? PCO2 33 (*) 35 - 45 (mmHg)   ? PO2 129 (*) 80 - 100 (mmHg)   ? O2 SAT 99 (*) 92 - 97 (%)   ? BICARBONATE 21 (*) 22 - 26 (mmol/L)   ? BASE DEFICIT 2.6 (*) BE NORMAL RANGE -3 T (mmol/L)   ? FIO2 40  (%)   ? SITE RB     ? MODE A/C     ? SET RATE 10  ( )   ? VT/PIP 500  ( )   ? PEEP/CPAP 5  ( )   ? SAMPLE SOURCE ARTERIAL     ? O2 METHOD VENTILATOR       Telemetry: Normal sinus rhythm at 84 bpm.    Assessment:     Breanna Lester Active Hospital Problem List:  Nausea with vomiting (05/01/2009)    Hypokalemia (05/01/2009)    Hepatic encephalopathy (05/01/2009)    Gait abnormality (05/01/2009)    Alcoholic cirrhosis of liver (05/01/2009)    Hypomagnesemia (05/01/2009)  Plan:     Continue supportive rx  ASA, beta blocker, lovenox   Continue vent support  Continue to monitor HGB--falling.    Archer L. Wonda, MD      Electronically signed by Wonda Woodie CROME at 05/08/2009  9:46 AM EDT

## 2009-05-08 NOTE — Progress Notes (Signed)
 Formatting of this note might be different from the original.  Nutrition F/U:  Thank you for TF order.  Will F/U Monday to check toleration.  TF not started yet.    Recommendations:  1.Now that Propofol and d5 are d/c'd, recommended goal rate of TF is 40 ml/hr.  2.Strict aspiration precautions   3.HOB >30 degrees    Delon Gravely, RD  Weekend pager [phone]  Electronically signed by Gravely Delon CROME, RD at 05/08/2009  2:47 PM EDT

## 2009-05-08 NOTE — Progress Notes (Signed)
 Formatting of this note is different from the original.       Hospitalist Progress Note         NAME: Breanna Lester        DOB:  [birth year]        MRN:  [id number]      Assessment & Plan   Sepsis syndrome :underlying pneumonia, fever, leukocytosis  Broad spectrum antibiotics.  No evidence of UTI or ascites(SBP), has an umbilical hernia that is not     reducible but overall adbomen is soft, has RUQ pain, Us  gallbladder sludge    Acute respiratory failure:intubated with underlying Pneumonia in ICU    Altered mental status : multifactorial Sepsis, hypernatremia, ARF, hyperamonemia     Hypernatremia: worsening, c/w free water  thru NGT    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,c/w lactulose thru NGT    Gait abnormality (05/01/2009) B12, folate normal, PT/OT when MS improved    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    Left wrist Fracture: casted Ortho following the case    Hypothyroidism: c/w  L-Thyroxine 50mcg     Subjective:     48yo caucasian female    Chief Complaint:  In ICU intubated , awake does not follow commands     Discussed with RN events overnight.     Review of Systems:  Fever/chills y   Cough N   Sputum    N   SOB/DOE  N   Chest Pain N   Abdominal Pain N   Diarrhea N   Constipation N   Nausea/Vomiting N   Dysuria N   Tolerating PT N   Tolerating Diet N   Could not obtain due to AMS vs intubation xx       Objective:   Patient intubated this AM with acute respiratory failure, critically ill, sedation was D/C more awake today does not  follow commands, will start feeding thru NGT    VITALS:   Last 24hrs VS reviewed since prior progress note. Most recent are:  Visit Vitals   Item Reading   ? BP 115/50   ? Pulse 76   ? Temp 99.3 F (37.4 C)   ? Resp 22   ? Ht 5' 2 (1.575 m)   ? Wt 144 lb 13.5 oz (65.7 kg)   ? SpO2 97%   ? PF 65L/min     Intake/Output Summary (Last 24 hours) at 05/08/09 0736  Last data filed at 05/08/09 0616   Gross per 24 hour   Intake 3369.8 ml   Output   2010 ml   Net 1359.8 ml       Telemetry Reviewed:     PHYSICAL EXAM:  General: Intubated, sedated  HEENT: Pupils unequal, R 3mm, L 2mm  Lungs:  Bilateral rales, ronchi and mild wheezing.  Heart:  Regular rhythm, tachycardic, SEM  Abdomen: Soft, Non distended, hypoactive BS, no rebound, umbilical hernia non reducible, pain in RUQ  Extremities: No edema, pulses present, brace in left arm  Neurologic: GCS [id number], no gross deficit  Psych: AMS  Lab Data Reviewed: (see below)    Medications Reviewed: (see below)    PMH/SH reviewed - no change compared to H&P  ______________________________________________________________________  Total time spent with patient: 45 minutes     Critical Care Provided     Minutes non procedure based    Care Plan discussed with:  Patient    Family    RN x   Care Manager    Consultant/Specialist      >50% of visit spent in counseling and coordination of care       Prophylaxis:  GI PPI   DVT lovenox      Disposition:   Home with Family    HH/PT/OT/RN    SNF/LTC    [facility]      ______________________________________________________________________  Attending Physician: Eric DOROTHA Deutscher, MD   _____________________________________________________________________________________________________  Procedures: see electronic medical records for all procedures/Xrays and details  which were not copied into this note but were reviewed prior to creation of Plan.      LABS:  Recent Labs   Basename 05/08/09 0415 05/07/09 0400   ? WBC 10.3 12.2*   ? HGB 8.5* 9.5*   ?  HCT 28.5* 30.9*   ? PLT 65* 74*     Recent Labs   Basename 05/08/09 0415 05/07/09 0400 05/06/09 0445   ? NA 160* 155* 156*   ? K 3.6 3.7 3.5   ? CL 126* 125* 123*   ? CO2 21 24 23    ? BUN 29* 40* 51*   ? CREA 1.1 1.1 1.5*   ? GLU 95 160* 195*   ? CA 8.0* 9.2 8.8   ? MG -- 3.0* --   ? PHOS -- -- --   ? URICA -- -- --     Recent Labs   Basename 05/08/09 0415 05/07/09 0400 05/06/09 0445   ? SGOT 54* 78* 101*   ? GPT 83* 99* 101*   ? AP 105 125 129   ? TBIL 3.8* 4.2* 4.4*   ? TP 5.6* 6.0* 5.9*   ? ALB 2.1* 2.4* 2.5*   ? GLOB 3.5 3.6 3.4   ? GGT -- -- --   ? AML -- -- --   ? LPSE -- -- --     Recent Labs   Basename 05/07/09 0400   ? INR 1.7*   ? PTP 16.9*   ? APTT --         No results found for this basename: FE:2,TIBC:2,PSAT:2,FERR:2, in the last 72 hours   Recent Labs   Basename 05/08/09 0438 05/07/09 0410   ? PH 7.42 7.43   ? PCO2 33* 27*   ? PO2 129* 110*     Recent Labs   Basename 05/06/09 0445   ? CPK 954*   ? CKMB --     Lab Results   Component Value Date/Time    POC GLUCOSE 114 05/01/2009  4:40 PM    POC GLUCOSE 162 05/01/2009 12:02 PM    POC GLUCOSE 115 05/01/2009  8:39 AM     Lab Results   Component Value Date/Time    Color YELLOW 04/30/2009 12:14 AM    Appearance CLEAR 04/30/2009 12:14 AM    Specific gravity 1.010 10/29/2008 11:00 AM    Specific gravity 1.011 04/30/2009 12:14 AM    pH 6.0 04/30/2009 12:14 AM    Protein NEGATIVE  04/30/2009 12:14 AM    Glucose NEGATIVE  04/30/2009 12:14 AM    Ketone NEGATIVE  04/30/2009  12:14 AM    Bilirubin NEGATIVE  04/30/2009 12:14 AM    Urobilinogen 2.0 04/30/2009 12:14 AM    Nitrites NEGATIVE  04/30/2009 12:14 AM    Leukocyte Esterase TRACE 04/30/2009 12:14 AM    Epithelial cells 10-20 04/30/2009 12:14 AM    Bacteria NEGATIVE  04/30/2009 12:14 AM    WBC 0-4 04/30/2009 12:14 AM    RBC 0-3 04/30/2009 12:14 AM     MEDICATIONS:  Current facility-administered medications   Medication Dose Route Frequency   ? piperacillin-tazobactam (ZOSYN) 3.375 g in 0.9% sodium chloride  (MBP/ADV) 100 mL MBP  3.375 g  IntraVENous Q6H   ? enoxaparin  (LOVENOX ) injection 70 mg  70 mg SubCUTAneous Q12H   ? dextrose 5% with KCl 20 mEq/L infusion    IntraVENous CONTINUOUS   ? VANCOMYCIN TROUGH LEVEL  1 Each Does Not Apply ONCE   ? DISCONTD: enoxaparin  (LOVENOX ) injection 66 mg  1 mg/kg SubCUTAneous Q12H   ? DISCONTD: vancomycin (VANCOCIN) 1 g in 0.9% sodium chloride  250 mL infusion  1 g IntraVENous ONCE   ? levothyroxine (SYNTHROID) tablet 50 mcg  50 mcg Oral ACB   ? lactulose (CHRONULAC) solution 20 g  20 g Oral BID   ? aspirin (ASPIRIN) tablet 325 mg  325 mg Oral DAILY   ? albumin human 5% (BUMINATE) solution 25 g  25 g IntraVENous Q4H PRN   ? vancomycin (VANCOCIN) 1 g in 0.9% sodium chloride  250 mL IVPB  1,000 mg IntraVENous Q24H   ? metoprolol  tartrate (LOPRESSOR ) injection 2.5 mg  2.5 mg IntraVENous Q6H   ? DISCONTD: dextrose 5% with KCl 20 mEq/L infusion    IntraVENous CONTINUOUS   ? morphine  injection 1 mg  1 mg IntraVENous PRN   ? pantoprazole  (PROTONIX ) injection 40 mg  40 mg IntraVENous DAILY   ? levofloxacin (LEVAQUIN) 750 mg infusion   750 mg IntraVENous Q48H   ? albuterol /ipratropium (DUONEB ) neb solution   1 Dose Nebulization Q4HWA RT   ? midazolam (VERSED) injection 2-4 mg  2-4 mg IntraVENous Q1H PRN   ? sodium chloride  (NS) flush 10 mL  10 mL InterCATHeter Q8H   ? sodium chloride  (NS) flush 10 mL  10 mL InterCATHeter PRN   ? heparin (porcine) pf 300 Units  300 Units InterCATHeter PRN   ? sodium chloride  (NS) flush 20 mL  20 mL InterCATHeter PRN   ? DISCONTD: piperacillin-tazobactam (ZOSYN) 2.25 g in 0.9% sodium chloride  (MBP/ADV) 50 mL MBP  2.25 g IntraVENous Q8H   ? acetaminophen  (TYLENOL ) suppository 650 mg  650 mg Rectal Q6H PRN   ? albuterol  (PROVENTIL  VENTOLIN ) nebulizer solution 2.5 mg  2.5 mg Nebulization Q2H PRN   ? thiamine (B-1) 100 mg in 0.9% sodium chloride  50 mL IVPB  100 mg IntraVENous DAILY   ? nitroglycerin (NITROBID) 2 % ointment 2 Inch  2 Inch Topical Q6H PRN   ? saline peripheral flush 5 mL  5 mL  InterCATHeter PRN       Electronically signed by Andris Eric PARAS, MD at 05/08/2009  7:38 AM EDT

## 2009-05-08 NOTE — Progress Notes (Signed)
Cardiology Progress Note      05/08/2009 10:13 AM    Admit Date: 04/29/2009        Subjective:     Patient remains intubated, sedated on vent.  Hemodynamically stable.    BP 112/46   Pulse 79   Temp 100.4 ??F (38 ??C)   Resp 23   Ht 5\' 2"  (1.575 m)   Wt 144 lb 13.5 oz (65.7 kg)   SpO2 99%   PF 65 L/min  Current facility-administered medications   Medication Dose Route Frequency   ??? piperacillin-tazobactam (ZOSYN) 3.375 g in 0.9% sodium chloride (MBP/ADV) 100 mL MBP  3.375 g IntraVENous Q6H   ??? enoxaparin (LOVENOX) injection 70 mg  70 mg SubCUTAneous Q12H   ??? VANCOMYCIN TROUGH LEVEL  1 Each Does Not Apply ONCE   ??? DISCONTD: dextrose 5% with KCl 20 mEq/L infusion    IntraVENous CONTINUOUS   ??? DISCONTD: vancomycin (VANCOCIN) 1 g in 0.9% sodium chloride 250 mL infusion  1 g IntraVENous ONCE   ??? levothyroxine (SYNTHROID) tablet 50 mcg  50 mcg Oral ACB   ??? lactulose (CHRONULAC) solution 20 g  20 g Oral BID   ??? aspirin (ASPIRIN) tablet 325 mg  325 mg Oral DAILY   ??? albumin human 5% (BUMINATE) solution 25 g  25 g IntraVENous Q4H PRN   ??? vancomycin (VANCOCIN) 1 g in 0.9% sodium chloride 250 mL IVPB  1,000 mg IntraVENous Q24H   ??? metoprolol tartrate (LOPRESSOR) injection 2.5 mg  2.5 mg IntraVENous Q6H   ??? DISCONTD: dextrose 5% with KCl 20 mEq/L infusion    IntraVENous CONTINUOUS   ??? morphine injection 1 mg  1 mg IntraVENous PRN   ??? pantoprazole (PROTONIX) injection 40 mg  40 mg IntraVENous DAILY   ??? levofloxacin (LEVAQUIN) 750 mg infusion   750 mg IntraVENous Q48H   ??? albuterol/ipratropium (DUONEB) neb solution   1 Dose Nebulization Q4HWA RT   ??? midazolam (VERSED) injection 2-4 mg  2-4 mg IntraVENous Q1H PRN   ??? sodium chloride (NS) flush 10 mL  10 mL InterCATHeter Q8H   ??? sodium chloride (NS) flush 10 mL  10 mL InterCATHeter PRN   ??? heparin (porcine) pf 300 Units  300 Units InterCATHeter PRN   ??? sodium chloride (NS) flush 20 mL  20 mL InterCATHeter PRN   ??? acetaminophen (TYLENOL) suppository 650 mg  650 mg Rectal Q6H PRN    ??? albuterol (PROVENTIL VENTOLIN) nebulizer solution 2.5 mg  2.5 mg Nebulization Q2H PRN   ??? thiamine (B-1) 100 mg in 0.9% sodium chloride 50 mL IVPB  100 mg IntraVENous DAILY   ??? nitroglycerin (NITROBID) 2 % ointment 2 Inch  2 Inch Topical Q6H PRN   ??? saline peripheral flush 5 mL  5 mL InterCATHeter PRN             Objective:      Physical Exam:  Intubated, sedated on vent  Chest--clear  CV--rrr Nl S1S2 I/VI sem  Abd--benign  Ext--unchanged      Data Review:   Labs:  Recent Results (from the past 24 hour(s))   CBC WITH AUTOMATED DIFF    Collection Time    05/08/09  4:15 AM   Component Value Range   ??? WBC 10.3  3.6 - 11.0 (K/uL)   ??? RBC 3.24 (*) 3.80 - 5.20 (M/uL)   ??? HGB 8.5 (*) 11.5 - 16.0 (g/dL)   ??? HCT 28.5 (*) 35.0 - 47.0 (%)   ???  MCV 88.0  80.0 - 99.0 (FL)   ??? MCH 26.2  26.0 - 34.0 (PG)   ??? MCHC 29.8 (*) 30.0 - 36.5 (g/dL)   ??? RDW 19.5 (*) 11.5 - 14.5 (%)   ??? PLATELET 65 (*) 150 - 400 (K/uL)   ??? NEUTROPHILS 74  32 - 75 (%)   ??? LYMPHOCYTES 14  12 - 49 (%)   ??? MONOCYTES 9  5 - 13 (%)   ??? EOSINOPHILS 3  0 - 7 (%)   ??? BASOPHILS 0  0 - 1 (%)   ??? ABSOLUTE NEUTS 7.7  1.8 - 8.0 (K/UL)   ??? ABSOLUTE LYMPHS 1.4  0.8 - 3.5 (K/UL)   ??? ABSOLUTE MONOS 0.9  0.0 - 1.0 (K/UL)   ??? ABSOLUTE EOSINS 0.3  0.0 - 0.4 (K/UL)   ??? ABSOLUTE BASOS 0.0  0.0 - 0.1 (K/UL)   ??? RBC COMMENTS 1+ ANISOCYTOSIS     ??? DF SMEAR SCANNED     METABOLIC PANEL, COMPREHENSIVE    Collection Time    05/08/09  4:15 AM   Component Value Range   ??? Sodium 160 (*) 136 - 145 (MMOL/L)   ??? Potassium 3.6  3.5 - 5.1 (MMOL/L)   ??? Chloride 126 (*) 97 - 108 (MMOL/L)   ??? CO2 21  21 - 32 (MMOL/L)   ??? Anion gap 13  5 - 15 (mmol/L)   ??? Glucose 95  65 - 100 (MG/DL)   ??? BUN 29 (*) 6 - 20 (MG/DL)   ??? Creatinine 1.1  0.6 - 1.3 (MG/DL)   ??? BUN/Creatinine ratio 26 (*) 12 - 20 ( )   ??? GFR est AA >60  >60 (ml/min/1.49m2)   ??? GFR est non-AA 56 (*) >60 (ml/min/1.27m2)   ??? Calcium 8.0 (*) 8.5 - 10.1 (MG/DL)   ??? Bilirubin, total 3.8 (*) 0.2 - 1.0 (MG/DL)   ??? ALT 83 (*) 12 - 78 (U/L)    ??? AST 54 (*) 15 - 37 (U/L)   ??? Alk. phosphatase 105  50 - 136 (U/L)   ??? Protein, total 5.6 (*) 6.4 - 8.2 (g/dL)   ??? Albumin 2.1 (*) 3.5 - 5.0 (g/dL)   ??? Globulin 3.5  2.0 - 4.0 (g/dL)   ??? A-G Ratio 0.6 (*) 1.1 - 2.2 ( )   BLOOD GAS, ARTERIAL    Collection Time    05/08/09  4:38 AM   Component Value Range   ??? pH 7.42  7.35 - 7.45 ( )   ??? PCO2 33 (*) 35 - 45 (mmHg)   ??? PO2 129 (*) 80 - 100 (mmHg)   ??? O2 SAT 99 (*) 92 - 97 (%)   ??? BICARBONATE 21 (*) 22 - 26 (mmol/L)   ??? BASE DEFICIT 2.6 (*) BE NORMAL RANGE -3 T (mmol/L)   ??? FIO2 40  (%)   ??? SITE RB     ??? MODE A/C     ??? SET RATE 10  ( )   ??? VT/PIP 500  ( )   ??? PEEP/CPAP 5  ( )   ??? SAMPLE SOURCE ARTERIAL     ??? O2 METHOD VENTILATOR             Telemetry: Normal sinus rhythm at 84 bpm.      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        Lakenzie Mcclafferty L. Vita Barley, MD

## 2009-05-08 NOTE — Progress Notes (Signed)
[facility] ??? Physician Progress Note  Pulmonary, Critical Care and Sleep Medicine  Name: Breanna Lester DOB:  Feb 12, 1961 MRN:  [id number] Date:  05/08/2009 2:30 PM    I have reviewed the flow sheet and previous notes  COMPLAINTS:   Mechanically ventilated, sedated  ROS: As mentioned in complaints, all other comprehensive review of systems is limited    EXAMINATION:  Vital Signs:         BP 101/48   Pulse 72   Temp 99.7 ??F (37.6 ??C)   Resp 21   Ht 5\' 2"  (1.575 m)   Wt 144 lb 13.5 oz (65.7 kg)   SpO2 100%   PF 40 L/min O2 Flow Rate (L/min): 2 l/min O2 Device: Endotracheal tube Temp (24hrs), Avg:99.1 ??F (37.3 ??C), Min:98.2 ??F (36.8 ??C), Max:100.4 ??F (38 ??C)    Intake/Output:     In: 200 Out: 300 (300 Urine)   In: 5649.8 (5019.8 I.V.)  Out: 2875 (2275 Urine)     Intake/Output Summary (Last 24 hours) at 05/08/09 1430  Last data filed at 05/08/09 1200   Gross per 24 hour   Intake 2386.5 ml   Output   1880 ml   Net  506.5 ml       General: No acute distress, not using accessory muscles of respiration  Head & Neck:+ Pallor/ icterus/ no cyanosis/ LAD/ JVD. Oral mucosa moist, PERRL  Chest:  Symmetrical coarse BS, no egophony/ rub/ crepts/ wheeze  Heart:  RRR, no gallops/ rub/ murmur  Abdomen: Soft, non-tender, no organomegaly, bowel sounds normoactive  Extremities: No clubbing/ cyanosis/ edema  Skin:  Warm, no rash  Neuro/ psych: Sedated    LABS AND  DATA: Personally reviewed  VENT: 10/500/40%, peep 5  CBC:10.3, 8.5, 65  BMP:160/3.6/126/21/29/1.1/95  ABG: 7.42/33/129  Cultures: reviewed  CXR: Patchy alveolar infiltrates  Tele: SR    MEDICATIONS: Reviewed for changes    ASSESSMENT:    Sepsis syndrome :underlying pneumonia, fever, leukocytosis  ????????????????????????Broad spectrum antibiotics, GB sludge  Acute respiratory failure:intubated with underlying Pneumonia in ICU  Altered mental status : multifactorial Sepsis, hypernatremia, ARF, hyperamonemia    Hypernatremia: worsening, c/w free water thru NGT  Pneumonia: c/w levaquin , zosyn and Vancomycin  Nausea with vomiting (05/01/2009) resolved, negative KUB and [place]  Hypokalemia (05/01/2009)??   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 CCT exclusive of procedures:35'      Thank you for allowing [place] to participate in the care of this patient.    Darra Lis, MD    Darra Lis, MD, [facility]  [facility]

## 2009-05-08 NOTE — Progress Notes (Signed)
Nutrition F/U:  Thank you for TF order.  Will F/U Monday to check toleration.  TF not started yet.    Recommendations:  1.Now that Propofol and d5 are d/c'd, recommended goal rate of TF is 40 ml/hr.  2.Strict aspiration precautions   3.HOB >30 degrees    Charlies Silvers, RD  Weekend pager [phone]

## 2009-05-08 NOTE — Progress Notes (Signed)
Hospitalist Progress Note         NAME: Breanna Lester        DOB:  28-May-1961        MRN:  [id number]      Assessment & Plan   Sepsis syndrome :underlying pneumonia, fever, leukocytosis  ????????????????????????Broad spectrum antibiotics.  ????????????????????????No evidence of UTI or ascites(SBP), has an umbilical hernia that is not     reducible but overall adbomen is soft, has RUQ pain, US gallbladder sludge    Acute respiratory failure:intubated with underlying Pneumonia in ICU    Altered mental status : multifactorial Sepsis, hypernatremia, ARF, hyperamonemia     Hypernatremia: worsening, c/w free water thru NGT    Pneumonia: c/w levaquin , zosyn and Vancomycin    Nausea with vomiting (05/01/2009) resolved, negative KUB and [place]    Hypokalemia (05/01/2009)  continue supplement KCL as needed  ????????????????????????Recheck labs    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    Left wrist Fracture: casted Ortho following the case    Hypothyroidism: c/w  L-Thyroxine       Subjective:     48yo caucasian female    Chief Complaint:  In ICU intubated , awake does not follow commands     Discussed with RN events overnight.     Review of Systems:  Fever/chills y   Cough N   Sputum    N   SOB/DOE  N   Chest Pain N   Abdominal Pain N   Diarrhea N   Constipation N   Nausea/Vomiting N   Dysuria N   Tolerating PT N   Tolerating Diet N   Could not obtain due to AMS vs intubation xx        Objective:   Patient intubated this AM with acute respiratory failure, critically ill, sedation was D/C more awake today does not follow commands, will start feeding thru NGT    VITALS:    Last 24hrs VS reviewed since prior progress note. Most recent are:  Visit Vitals   Item Reading   ??? BP 115/50   ??? Pulse 76   ??? Temp 99.3 ??F (37.4 ??C)   ??? Resp 22   ??? Ht 5\' 2"  (1.575 m)   ??? Wt 144 lb 13.5 oz (65.7 kg)   ??? SpO2 97%   ??? PF 65L/min           Intake/Output Summary (Last 24 hours) at 05/08/09 0736  Last data filed at 05/08/09 0616   Gross per 24 hour   Intake 3369.8 ml   Output   2010 ml   Net 1359.8 ml        Telemetry Reviewed:     PHYSICAL EXAM:  General: Intubated, sedated????  HEENT: Pupils unequal, R 3mm, L 2mm  Lungs:  Bilateral rales, ronchi and mild wheezing.  Heart:  Regular rhythm, tachycardic, SEM  Abdomen: Soft, Non distended, hypoactive BS, no rebound, umbilical hernia non reducible, pain in RUQ  Extremities: No edema, pulses present, brace in left arm  Neurologic:?? GCS [id number], no gross deficit  Psych:???? AMS    Lab Data Reviewed: (see  below)    Medications Reviewed: (see below)    PMH/SH reviewed - no change compared to H&P  ______________________________________________________________________  Total time spent with patient: 45 minutes     Critical Care Provided     Minutes non procedure based    Care Plan discussed with:  Patient    Family    RN x   Care Manager    Consultant/Specialist      >50% of visit spent in counseling and coordination of care       Prophylaxis:  GI PPI   DVT lovenox     Disposition:   Home with Family    HH/PT/OT/RN    SNF/LTC    [facility]      ______________________________________________________________________  Attending Physician: Hazeline Junker, MD   _____________________________________________________________________________________________________  Procedures: see electronic medical records for all procedures/Xrays and details  which were not copied into this note but were reviewed prior to creation of Plan.      LABS:  Recent Labs   Basename 05/08/09 0415 05/07/09 0400   ??? WBC 10.3 12.2*   ??? HGB 8.5* 9.5*   ??? HCT 28.5* 30.9*   ??? PLT 65* 74*          Recent Labs   Basename 05/08/09 0415 05/07/09 0400 05/06/09 0445   ??? NA 160* 155* 156*   ??? K 3.6 3.7 3.5   ??? CL 126* 125* 123*   ??? CO2 21 24 23    ??? BUN 29* 40* 51*   ??? CREA 1.1 1.1 1.5*   ??? GLU 95 160* 195*   ??? CA 8.0* 9.2 8.8   ??? MG -- 3.0* --   ??? PHOS -- -- --   ??? URICA -- -- --         Recent Labs   Basename 05/08/09 0415 05/07/09 0400 05/06/09 0445   ??? SGOT 54* 78* 101*   ??? GPT 83* 99* 101*   ??? AP 105 125 129   ??? TBIL 3.8* 4.2* 4.4*   ??? TP 5.6* 6.0* 5.9*   ??? ALB 2.1* 2.4* 2.5*   ??? GLOB 3.5 3.6 3.4   ??? GGT -- -- --   ??? AML -- -- --   ??? LPSE -- -- --         Recent Labs   Basename 05/07/09 0400   ??? INR 1.7*   ??? PTP 16.9*   ??? APTT --          No results found for this basename: FE:2,TIBC:2,PSAT:2,FERR:2, in the last 72 hours   Recent Labs   Basename 05/08/09 0438 05/07/09 0410   ??? PH 7.42 7.43   ??? PCO2 33* 27*   ??? PO2 129* 110*         Recent Labs   Basename 05/06/09 0445   ??? CPK 954*   ??? CKMB --         Lab Results   Component Value Date/Time    POC GLUCOSE 114 05/01/2009  4:40 PM    POC GLUCOSE 162 05/01/2009 12:02 PM    POC GLUCOSE 115 05/01/2009  8:39 AM         Lab Results   Component Value Date/Time    Color YELLOW 04/30/2009 12:14 AM    Appearance CLEAR 04/30/2009 12:14 AM    Specific gravity 1.010 10/29/2008 11:00 AM    Specific gravity 1.011 04/30/2009 12:14 AM    pH 6.0 04/30/2009 12:14 AM    Protein  NEGATIVE  04/30/2009 12:14 AM    Glucose NEGATIVE  04/30/2009 12:14 AM    Ketone NEGATIVE  04/30/2009 12:14 AM    Bilirubin NEGATIVE  04/30/2009 12:14 AM    Urobilinogen 2.0 04/30/2009 12:14 AM    Nitrites NEGATIVE  04/30/2009 12:14 AM    Leukocyte Esterase TRACE 04/30/2009 12:14 AM    Epithelial cells 10-20 04/30/2009 12:14 AM    Bacteria NEGATIVE  04/30/2009 12:14 AM    WBC 0-4 04/30/2009 12:14 AM    RBC 0-3 04/30/2009 12:14 AM           MEDICATIONS:  Current facility-administered medications   Medication Dose Route Frequency    ??? piperacillin-tazobactam (ZOSYN) 3.375 g in 0.9% sodium chloride (MBP/ADV) 100 mL MBP  3.375 g IntraVENous Q6H   ??? enoxaparin (LOVENOX) injection 70 mg  70 mg SubCUTAneous Q12H   ??? dextrose 5% with KCl 20 mEq/L infusion    IntraVENous CONTINUOUS   ??? VANCOMYCIN TROUGH LEVEL  1 Each Does Not Apply ONCE   ??? DISCONTD: enoxaparin (LOVENOX) injection 66 mg  1 mg/kg SubCUTAneous Q12H   ??? DISCONTD: vancomycin (VANCOCIN) 1 g in 0.9% sodium chloride 250 mL infusion  1 g IntraVENous ONCE   ??? levothyroxine (SYNTHROID) tablet 50 mcg  50 mcg Oral ACB   ??? lactulose (CHRONULAC) solution 20 g  20 g Oral BID   ??? aspirin (ASPIRIN) tablet 325 mg  325 mg Oral DAILY   ??? albumin human 5% (BUMINATE) solution 25 g  25 g IntraVENous Q4H PRN   ??? vancomycin (VANCOCIN) 1 g in 0.9% sodium chloride 250 mL IVPB  1,000 mg IntraVENous Q24H   ??? metoprolol tartrate (LOPRESSOR) injection 2.5 mg  2.5 mg IntraVENous Q6H   ??? DISCONTD: dextrose 5% with KCl 20 mEq/L infusion    IntraVENous CONTINUOUS   ??? morphine injection 1 mg  1 mg IntraVENous PRN   ??? pantoprazole (PROTONIX) injection 40 mg  40 mg IntraVENous DAILY   ??? levofloxacin (LEVAQUIN) 750 mg infusion   750 mg IntraVENous Q48H   ??? albuterol/ipratropium (DUONEB) neb solution   1 Dose Nebulization Q4HWA RT   ??? midazolam (VERSED) injection 2-4 mg  2-4 mg IntraVENous Q1H PRN   ??? sodium chloride (NS) flush 10 mL  10 mL InterCATHeter Q8H   ??? sodium chloride (NS) flush 10 mL  10 mL InterCATHeter PRN   ??? heparin (porcine) pf 300 Units  300 Units InterCATHeter PRN   ??? sodium chloride (NS) flush 20 mL  20 mL InterCATHeter PRN   ??? DISCONTD: piperacillin-tazobactam (ZOSYN) 2.25 g in 0.9% sodium chloride (MBP/ADV) 50 mL MBP  2.25 g IntraVENous Q8H   ??? acetaminophen (TYLENOL) suppository 650 mg  650 mg Rectal Q6H PRN   ??? albuterol (PROVENTIL VENTOLIN) nebulizer solution 2.5 mg  2.5 mg Nebulization Q2H PRN   ??? thiamine (B-1) 100 mg in 0.9% sodium chloride 50 mL IVPB  100 mg IntraVENous DAILY    ??? nitroglycerin (NITROBID) 2 % ointment 2 Inch  2 Inch Topical Q6H PRN   ??? saline peripheral flush 5 mL  5 mL InterCATHeter PRN

## 2009-05-08 NOTE — Progress Notes (Signed)
Tube feeding started.

## 2009-05-09 LAB — BLOOD GAS ARTERIAL,VENT
BASE DEFICIT: 2.9 mmol/L — AB
BASE DEFICIT: 3.9 mmol/L — AB
BASE DEFICIT: 1.8 mmol/L — AB
BICARBONATE: 21 mmol/L — ABNORMAL LOW (ref 22–26)
BICARBONATE: 19 mmol/L — ABNORMAL LOW (ref 22–26)
BICARBONATE: 22 mmol/L (ref 22–26)
FIO2: 40 %
FIO2: 40 %
FIO2: 40 %
O2 SAT: 98 % — ABNORMAL HIGH (ref 92–97)
O2 SAT: 99 % — ABNORMAL HIGH (ref 92–97)
O2 SAT: 98 % — ABNORMAL HIGH (ref 92–97)
PCO2: 36 mmHg (ref 35–45)
PCO2: 30 mmHg — ABNORMAL LOW (ref 35–45)
PCO2: 37 mmHg (ref 35–45)
PEEP/CPAP: 5
PEEP/CPAP: 5
PEEP/CPAP: 5
PO2: 108 mmHg — ABNORMAL HIGH (ref 80–100)
PO2: 134 mmHg — ABNORMAL HIGH (ref 80–100)
PO2: 106 mmHg — ABNORMAL HIGH (ref 80–100)
PRESSURE SUPPORT: 5
PRESSURE SUPPORT: 5
SET RATE: 10
SPONTANEOUS RATE: 33
SPONTANEOUS RATE: 32
VT/PIP: 500
pH: 7.39 (ref 7.35–7.45)
pH: 7.42 (ref 7.35–7.45)
pH: 7.41 (ref 7.35–7.45)

## 2009-05-09 LAB — URINALYSIS W/MICROSCOPIC
Bacteria: NEGATIVE /HPF
Blood: NEGATIVE
Glucose: NEGATIVE MG/DL
Ketone: NEGATIVE MG/DL
Nitrites: NEGATIVE
Specific gravity: 1.024 (ref 1.003–1.030)
Urobilinogen: 0.2 EU/DL (ref 0.2–1.0)
pH (UA): 6 (ref 5.0–8.0)

## 2009-05-09 LAB — CULTURE, BLOOD: Culture result:: NO GROWTH

## 2009-05-09 LAB — METABOLIC PANEL, COMPREHENSIVE
A-G Ratio: 0.6 — ABNORMAL LOW (ref 1.1–2.2)
ALT (SGPT): 82 U/L — ABNORMAL HIGH (ref 12–78)
AST (SGOT): 52 U/L — ABNORMAL HIGH (ref 15–37)
Albumin: 2.2 g/dL — ABNORMAL LOW (ref 3.5–5.0)
Alk. phosphatase: 110 U/L (ref 50–136)
Anion gap: 7 mmol/L (ref 5–15)
BUN/Creatinine ratio: 27 — ABNORMAL HIGH (ref 12–20)
BUN: 24 MG/DL — ABNORMAL HIGH (ref 6–20)
Bilirubin, total: 4.1 MG/DL — ABNORMAL HIGH (ref 0.2–1.0)
CO2: 22 MMOL/L (ref 21–32)
Calcium: 8.5 MG/DL (ref 8.5–10.1)
Chloride: 124 MMOL/L — ABNORMAL HIGH (ref 97–108)
Creatinine: 0.9 MG/DL (ref 0.6–1.3)
GFR est AA: >60 mL/min/{1.73_m2} (ref >60)
GFR est non-AA: >60 mL/min/{1.73_m2} (ref >60)
Globulin: 3.5 g/dL (ref 2.0–4.0)
Glucose: 120 MG/DL — ABNORMAL HIGH (ref 65–100)
Potassium: 3.8 MMOL/L (ref 3.5–5.1)
Protein, total: 5.7 g/dL — ABNORMAL LOW (ref 6.4–8.2)
Sodium: 153 MMOL/L — ABNORMAL HIGH (ref 136–145)

## 2009-05-09 LAB — CBC WITH AUTOMATED DIFF
ABS. BASOPHILS: 0 10*3/uL (ref 0.0–0.1)
ABS. EOSINOPHILS: 0.4 10*3/uL (ref 0.0–0.4)
ABS. LYMPHOCYTES: 1.8 10*3/uL (ref 0.8–3.5)
ABS. MONOCYTES: 1 10*3/uL (ref 0.0–1.0)
ABS. NEUTROPHILS: 9.4 10*3/uL — ABNORMAL HIGH (ref 1.8–8.0)
BASOPHILS: 0 % (ref 0–1)
EOSINOPHILS: 3 % (ref 0–7)
HCT: 29.9 % — ABNORMAL LOW (ref 35.0–47.0)
HGB: 9 g/dL — ABNORMAL LOW (ref 11.5–16.0)
LYMPHOCYTES: 14 % (ref 12–49)
MCH: 27 PG (ref 26.0–34.0)
MCHC: 30.1 g/dL (ref 30.0–36.5)
MCV: 89.8 FL (ref 80.0–99.0)
MONOCYTES: 8 % (ref 5–13)
NEUTROPHILS: 75 % (ref 32–75)
PLATELET: 56 10*3/uL — ABNORMAL LOW (ref 150–400)
RBC: 3.33 M/uL — ABNORMAL LOW (ref 3.80–5.20)
RDW: 19.9 % — ABNORMAL HIGH (ref 11.5–14.5)
WBC: 12.6 10*3/uL — ABNORMAL HIGH (ref 3.6–11.0)

## 2009-05-09 LAB — BILIRUBIN, CONFIRM: Bilirubin UA, confirm: POSITIVE — AB

## 2009-05-09 LAB — CK: CK: 229 U/L — ABNORMAL HIGH (ref 21–215)

## 2009-05-09 LAB — AMMONIA: Ammonia, plasma: 27 umol/L (ref <32)

## 2009-05-09 LAB — VANCOMYCIN, TROUGH
Reported dose:: 1 UNITS
Vancomycin,trough: 6.5 ug/ml (ref 5.0–10.0)

## 2009-05-09 LAB — MAGNESIUM: Magnesium: 2.4 MG/DL (ref 1.6–2.4)

## 2009-05-09 MED ADMIN — metoprolol tartrate (LOPRESSOR) injection 2.5 mg: INTRAVENOUS | @ 16:00:00 | NDC 00143987310

## 2009-05-09 MED ADMIN — metoprolol tartrate (LOPRESSOR) injection 2.5 mg: INTRAVENOUS | @ 23:00:00 | NDC 00143987310

## 2009-05-09 MED ADMIN — enoxaparin (LOVENOX) injection 70 mg: SUBCUTANEOUS | @ 03:00:00 | NDC 00075062430

## 2009-05-09 MED ADMIN — ioversol (OPTIRAY) 350 mg/mL contrast solution 100 mL: INTRAVENOUS | @ 20:00:00 | NDC 00019133311

## 2009-05-09 MED ADMIN — pantoprazole (PROTONIX) injection 40 mg: INTRAVENOUS | @ 13:00:00 | NDC 00409488810

## 2009-05-09 MED ADMIN — piperacillin-tazobactam (ZOSYN) 3.375 g in 0.9% sodium chloride (MBP/ADV) 100 mL MBP: INTRAVENOUS | @ 03:00:00 | NDC 00338055318

## 2009-05-09 MED ADMIN — albuterol/ipratropium (DUONEB) neb solution: RESPIRATORY_TRACT | @ 22:00:00 | NDC 00487980101

## 2009-05-09 MED ADMIN — sodium chloride (NS) flush 10 mL: @ 22:00:00 | NDC 87701099893

## 2009-05-09 MED ADMIN — thiamine (B-1) 100 mg in 0.9% sodium chloride 50 mL IVPB: INTRAVENOUS | @ 14:00:00 | NDC 63323001302

## 2009-05-09 MED ADMIN — albuterol/ipratropium (DUONEB) neb solution: RESPIRATORY_TRACT | @ 12:00:00 | NDC 00487980101

## 2009-05-09 MED ADMIN — 0.9% sodium chloride infusion: INTRAVENOUS | @ 20:00:00 | NDC 00409798309

## 2009-05-09 MED ADMIN — metoprolol tartrate (LOPRESSOR) injection 2.5 mg: INTRAVENOUS | @ 03:00:00 | NDC 00143987310

## 2009-05-09 MED ADMIN — sodium chloride (NS) flush 10 mL: @ 10:00:00 | NDC 87701099893

## 2009-05-09 MED ADMIN — vancomycin (VANCOCIN) 0.75 g in 0.9% sodium chloride 250 mL IVPB: INTRAVENOUS | @ 23:00:00 | NDC 63323031461

## 2009-05-09 MED ADMIN — levofloxacin (LEVAQUIN) 750 mg infusion: INTRAVENOUS | @ 13:00:00 | NDC 50458016601

## 2009-05-09 MED ADMIN — sodium chloride (NS) flush 10 mL: @ 18:00:00 | NDC 87701099893

## 2009-05-09 MED ADMIN — levothyroxine (SYNTHROID) tablet 50 mcg: ORAL | @ 10:00:00 | NDC 51079044401

## 2009-05-09 MED ADMIN — sodium chloride (NS) flush 10 mL: INTRAVENOUS | @ 20:00:00 | NDC 58016499501

## 2009-05-09 MED ADMIN — dextrose 5% infusion: INTRAVENOUS | @ 05:00:00 | NDC 00409792209

## 2009-05-09 MED ADMIN — albuterol/ipratropium (DUONEB) neb solution: RESPIRATORY_TRACT | @ 02:00:00 | NDC 00487980101

## 2009-05-09 MED ADMIN — aspirin (ASPIRIN) tablet 325 mg: ORAL | @ 13:00:00 | NDC 00904200960

## 2009-05-09 MED ADMIN — sodium chloride (NS) flush 10 mL: @ 03:00:00 | NDC 87701099893

## 2009-05-09 MED ADMIN — lactulose (CHRONULAC) solution 20 g: ORAL | @ 14:00:00 | NDC 00121457730

## 2009-05-09 MED ADMIN — dextrose 5% infusion: INTRAVENOUS | @ 18:00:00 | NDC 00409792209

## 2009-05-09 MED ADMIN — enoxaparin (LOVENOX) injection 70 mg: SUBCUTANEOUS | @ 13:00:00 | NDC 00075062430

## 2009-05-09 MED ADMIN — lactulose (CHRONULAC) solution 20 g: ORAL | @ 03:00:00 | NDC 00121457730

## 2009-05-09 MED ADMIN — metoprolol tartrate (LOPRESSOR) injection 2.5 mg: INTRAVENOUS | @ 10:00:00 | NDC 00143987310

## 2009-05-09 MED ADMIN — piperacillin-tazobactam (ZOSYN) 3.375 g in 0.9% sodium chloride (MBP/ADV) 100 mL MBP: INTRAVENOUS | @ 10:00:00 | NDC 00338055318

## 2009-05-09 MED ADMIN — barium sulfate (READICAT) 2 % oral suspension 900 mL: ORAL | @ 14:00:00 | NDC 32909075503

## 2009-05-09 MED ADMIN — heparin (porcine) pf 300 Units: @ 22:00:00

## 2009-05-09 MED ADMIN — albuterol/ipratropium (DUONEB) neb solution: RESPIRATORY_TRACT | @ 15:00:00 | NDC 00487980101

## 2009-05-09 MED ADMIN — piperacillin-tazobactam (ZOSYN) 3.375 g in 0.9% sodium chloride (MBP/ADV) 100 mL MBP: INTRAVENOUS | @ 16:00:00 | NDC 00338055318

## 2009-05-09 MED ADMIN — piperacillin-tazobactam (ZOSYN) 3.375 g in 0.9% sodium chloride (MBP/ADV) 100 mL MBP: INTRAVENOUS | @ 23:00:00 | NDC 00338055318

## 2009-05-09 MED FILL — VANCOMYCIN 10 GRAM IV SOLR: 10 gram | INTRAVENOUS | Qty: 1

## 2009-05-09 MED FILL — SALINE FLUSH INJECTION SYRINGE: INTRAMUSCULAR | Qty: 30

## 2009-05-09 MED FILL — OPTIRAY 350 MG IODINE/ML INTRAVENOUS SOLUTION: 350 mg iodine/mL | INTRAVENOUS | Qty: 100

## 2009-05-09 MED FILL — VANCOMYCIN 10 GRAM IV SOLR: 10 gram | INTRAVENOUS | Qty: 0.75

## 2009-05-09 MED FILL — SALINE FLUSH INJECTION SYRINGE: INTRAMUSCULAR | Qty: 50

## 2009-05-09 MED FILL — SODIUM CHLORIDE 0.9 % IV: INTRAVENOUS | Qty: 1000

## 2009-05-09 MED FILL — SALINE FLUSH INJECTION SYRINGE: INTRAMUSCULAR | Qty: 20

## 2009-05-09 MED FILL — METOPROLOL TARTRATE 5 MG/5 ML IV SOLN: 5 mg/ mL | INTRAVENOUS | Qty: 5

## 2009-05-09 MED FILL — SALINE FLUSH INJECTION SYRINGE: INTRAMUSCULAR | Qty: 10

## 2009-05-09 MED FILL — LEVAQUIN 750 MG/150 ML IN 5 % DEXTROSE INTRAVENOUS PIGGYBACK: 750 mg/150 mL | INTRAVENOUS | Qty: 150

## 2009-05-09 MED FILL — IPRATROPIUM BROMIDE 0.02 % SOLN FOR INHALATION: 0.02 % | RESPIRATORY_TRACT | Qty: 7.5

## 2009-05-09 MED FILL — IPRATROPIUM BROMIDE 0.02 % SOLN FOR INHALATION: 0.02 % | RESPIRATORY_TRACT | Qty: 2.5

## 2009-05-09 MED FILL — LEVOTHYROXINE 25 MCG TAB: 25 mcg | ORAL | Qty: 2

## 2009-05-09 MED FILL — ASPIRIN 325 MG TAB: 325 mg | ORAL | Qty: 1

## 2009-05-09 MED FILL — THIAMINE 100 MG/ML INJECTION: 100 mg/mL | INTRAMUSCULAR | Qty: 1

## 2009-05-09 MED FILL — SALINE FLUSH INJECTION SYRINGE: INTRAMUSCULAR | Qty: 40

## 2009-05-09 MED FILL — LOVENOX 30 MG/0.3 ML SUB-Q SYRINGE: 30 mg/0.3 mL | SUBCUTANEOUS | Qty: 1

## 2009-05-09 MED FILL — ZOSYN 3.375 GRAM INTRAVENOUS SOLUTION: 3.375 gram | INTRAVENOUS | Qty: 3.38

## 2009-05-09 MED FILL — SODIUM CHLORIDE 0.9 % INJECTION: INTRAMUSCULAR | Qty: 10

## 2009-05-09 MED FILL — LACTULOSE 10 GRAM/15 ML ORAL SOLN: 10 gram/15 mL | ORAL | Qty: 30

## 2009-05-09 MED FILL — READI-CAT 2  2.1 % (W/V), 2.0 % (W/W) ORAL SUSPENSION: 2.1 % (w/v), 2.0 % (w/w) | ORAL | Qty: 900

## 2009-05-09 NOTE — Progress Notes (Signed)
 Formatting of this note is different from the original.       Hospitalist Progress Note         NAME: Breanna Lester        DOB:  [birth year]        MRN:  [id number]      Assessment & Plan   Sepsis syndrome :underlying pneumonia, fever, leukocytosis  Broad spectrum antibiotics.  No evidence of UTI or ascites(SBP), has an umbilical hernia that is not     reducible but overall adbomen is soft, has RUQ pain, Us  gallbladder sludge, obtain CT abdomen    Acute respiratory failure:intubated with underlying Pneumonia in ICU    Altered mental status : multifactorial Sepsis, hypernatremia, ARF, hyperamonemia     Hypernatremia: improving, c/w free water  thru NGT    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,c/w lactulose thru NGT    Gait abnormality (05/01/2009) B12, folate normal, PT/OT when MS improved    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     Review of Systems:  Fever/chills y   Cough N   Sputum    N   SOB/DOE  N   Chest Pain N   Abdominal Pain N   Diarrhea N   Constipation N   Nausea/Vomiting N   Dysuria N   Tolerating PT N   Tolerating Diet N   Could not obtain due to AMS vs intubation xx       Objective:   Patient intubated this AM with acute respiratory failure, critically ill, sedation was D/C more awake today  does not follow commands, started feeding thru NGT, abdomen painful in RUQ will obtain CT abdomen    VITALS:   Last 24hrs VS reviewed since prior progress note. Most recent are:  Visit Vitals   Item Reading   ? BP 113/49   ? Pulse 72   ? Temp 99.7 F (37.6 C)   ? Resp 20   ? Ht 5' 2 (1.575 m)   ? Wt 144 lb 13.5 oz (65.7 kg)   ? SpO2 99%   ? PF 40L/min     Intake/Output Summary (Last 24 hours) at 05/09/09 0738  Last data filed at 05/09/09 0600   Gross per 24 hour   Intake 2029.1 ml   Output   1560 ml   Net  469.1 ml       Telemetry Reviewed:     PHYSICAL EXAM:  General: Intubated, sedated  HEENT: Pupils unequal, R 3mm, L 2mm  Lungs:  Bilateral rales, ronchi and mild wheezing.  Heart:  Regular rhythm, tachycardic, SEM  Abdomen: Soft, Non distended, hypoactive BS, no rebound, umbilical hernia non reducible, pain in RUQ  Extremities: No edema, pulses present, brace in left arm  Neurologic: GCS  M5E3V1T, no gross deficit  Psych: AMS    Lab Data Reviewed: (see below)    Medications Reviewed: (see below)    PMH/SH reviewed - no change compared to H&P  ______________________________________________________________________  Total time spent with patient: 45 minutes     Critical Care Provided     Minutes non procedure based    Care Plan discussed with:  Patient    Family    RN x   Care Manager    Consultant/Specialist      >50% of visit spent in counseling and coordination of care       Prophylaxis:  GI PPI   DVT lovenox      Disposition:   Home with Family    HH/PT/OT/RN    SNF/LTC    [facility]      ______________________________________________________________________  Attending Physician: Eric DOROTHA Deutscher, MD   _____________________________________________________________________________________________________  Procedures: see electronic medical records for all procedures/Xrays and details  which were not copied into this note but were reviewed prior to creation of Plan.      LABS:  Recent Labs   Basename 05/09/09 0510  05/08/09 0415   ? WBC 12.6* 10.3   ? HGB 9.0* 8.5*   ? HCT 29.9* 28.5*   ? PLT 56* 65*     Recent Labs   Basename 05/09/09 0510 05/08/09 0415 05/07/09 0400   ? NA 153* 160* 155*   ? K 3.8 3.6 3.7   ? CL 124* 126* 125*   ? CO2 22 21 24    ? BUN 24* 29* 40*   ? CREA 0.9 1.1 1.1   ? GLU 120* 95 160*   ? CA 8.5 8.0* 9.2   ? MG 2.4 -- 3.0*   ? PHOS -- -- --   ? URICA -- -- --     Recent Labs   Basename 05/09/09 0510 05/08/09 0415 05/07/09 0400   ? SGOT 52* 54* 78*   ? GPT 82* 83* 99*   ? AP 110 105 125   ? TBIL 4.1* 3.8* 4.2*   ? TP 5.7* 5.6* 6.0*   ? ALB 2.2* 2.1* 2.4*   ? GLOB 3.5 3.5 3.6   ? GGT -- -- --   ? AML -- -- --   ? LPSE -- -- --     Recent Labs   Basename 05/07/09 0400   ? INR 1.7*   ? PTP 16.9*   ? APTT --         No results found for this basename: FE:2,TIBC:2,PSAT:2,FERR:2, in the last 72 hours   Recent Labs   Basename 05/09/09 0445 05/08/09 0438   ? PH 7.39 7.42   ? PCO2 36 33*   ? PO2 108* 129*     Recent Labs   Basename 05/09/09 0510   ? CPK 229*   ? CKMB --     Lab Results   Component Value Date/Time    POC GLUCOSE 114 05/01/2009  4:40 PM    POC GLUCOSE 162 05/01/2009 12:02 PM    POC GLUCOSE 115 05/01/2009  8:39 AM     Lab Results   Component Value Date/Time    Color YELLOW 04/30/2009 12:14 AM    Appearance CLEAR 04/30/2009 12:14 AM    Specific gravity 1.010 10/29/2008 11:00 AM    Specific gravity 1.011 04/30/2009 12:14 AM    pH 6.0 04/30/2009 12:14 AM    Protein NEGATIVE  04/30/2009 12:14 AM    Glucose NEGATIVE  04/30/2009 12:14 AM    Ketone NEGATIVE  04/30/2009 12:14 AM    Bilirubin NEGATIVE  04/30/2009 12:14 AM    Urobilinogen 2.0 04/30/2009 12:14 AM    Nitrites NEGATIVE  04/30/2009 12:14 AM    Leukocyte Esterase TRACE 04/30/2009 12:14 AM    Epithelial cells 10-20 04/30/2009 12:14 AM    Bacteria NEGATIVE  04/30/2009 12:14 AM    WBC 0-4 04/30/2009 12:14 AM    RBC 0-3 04/30/2009 12:14 AM     MEDICATIONS:  Current facility-administered medications   Medication Dose Route Frequency   ? levofloxacin (LEVAQUIN) 750 mg infusion   750  mg IntraVENous Q24H   ? dextrose 5% infusion    IntraVENous CONTINUOUS   ? piperacillin-tazobactam (ZOSYN) 3.375 g in 0.9% sodium chloride  (MBP/ADV) 100 mL MBP  3.375 g IntraVENous Q6H   ? enoxaparin  (LOVENOX ) injection 70 mg  70 mg SubCUTAneous Q12H   ? VANCOMYCIN TROUGH LEVEL  1 Each Does Not Apply ONCE   ? DISCONTD: dextrose 5% with KCl 20 mEq/L infusion    IntraVENous CONTINUOUS   ? levothyroxine (SYNTHROID) tablet 50 mcg  50 mcg Oral ACB   ? lactulose (CHRONULAC) solution 20 g  20 g Oral BID   ? aspirin (ASPIRIN) tablet 325 mg  325 mg Oral DAILY   ? albumin human 5% (BUMINATE) solution 25 g  25 g IntraVENous Q4H PRN   ? vancomycin (VANCOCIN) 1 g in 0.9% sodium chloride  250 mL IVPB  1,000 mg IntraVENous Q24H   ? metoprolol  tartrate (LOPRESSOR ) injection 2.5 mg  2.5 mg IntraVENous Q6H   ? morphine  injection 1 mg  1 mg IntraVENous PRN   ? pantoprazole  (PROTONIX ) injection 40 mg  40 mg IntraVENous DAILY   ? albuterol /ipratropium (DUONEB ) neb solution   1 Dose Nebulization Q4HWA RT   ? midazolam (VERSED) injection 2-4 mg  2-4 mg IntraVENous Q1H PRN   ? sodium chloride  (NS) flush 10 mL  10 mL InterCATHeter Q8H   ? sodium chloride  (NS) flush 10 mL  10 mL InterCATHeter PRN   ? heparin (porcine) pf 300 Units  300 Units InterCATHeter PRN   ? sodium chloride  (NS) flush 20 mL  20 mL InterCATHeter PRN   ? DISCONTD: levofloxacin (LEVAQUIN) 750 mg infusion   750 mg IntraVENous Q48H   ? acetaminophen  (TYLENOL ) suppository 650 mg  650 mg Rectal Q6H PRN   ? albuterol  (PROVENTIL  VENTOLIN ) nebulizer solution 2.5 mg  2.5 mg Nebulization Q2H PRN   ? thiamine (B-1) 100 mg in 0.9% sodium chloride  50 mL IVPB  100 mg IntraVENous DAILY   ? nitroglycerin (NITROBID) 2 % ointment 2 Inch  2 Inch Topical Q6H PRN   ? saline peripheral flush 5 mL  5 mL InterCATHeter PRN       Electronically signed by Andris Eric PARAS, MD at 05/09/2009  7:40 AM EDT

## 2009-05-09 NOTE — Progress Notes (Signed)
 Formatting of this note might be different from the original.  Pharmacokinetic monitor: vancomycin     S/O:   48 yoF being treated for sepsis syndrome with underlying pneumonia, fever, leukocytosis.   Currently on vancomycin, levofloxacin, and zosyn.     Ht: 62'' Wt: 65.7 kg   Scr: 0.9 BUN: 24 Est CrCl: 63 mL/min   Vancomycin trough: 6.5    A/P:   Vancomycin (true) trough came back 6.5; below goal range of 15-20 (likely due to improving renal  function).   Will increase vancomycin dose to 750 mg IV every 12 hours, to start today (9/19) at 1400.     Goal trough: 15-20.    Pharmacy will continue to monitor.    Thank you,  Delon Brain, PharmD  Electronically signed by Brain Delon CROME, PHARMD at 05/09/2009  1:03 PM EDT

## 2009-05-09 NOTE — Progress Notes (Signed)
 Formatting of this note is different from the original.        [facility] - Physician Progress Note  Pulmonary, Critical Care and Sleep Medicine  Name: Breanna Lester DOB:  Aug 09, 1961 MRN:  [id number] Date:  05/09/2009 1:18 PM    I have reviewed the flow sheet and previous notes  COMPLAINTS:   Sedation held, awake on rounds, on SBT- PS 5, VTE 350+, comfortable  ROS: As mentioned in complaints, all other comprehensive review of systems is limited    EXAMINATION:  Vital Signs:         BP 104/40  Pulse 74  Temp 98.8 F (37.1 C)  Resp 17  Ht 5' 2 (1.575 m)  Wt 144 lb 13.5 oz (65.7 kg)  SpO2 100%  PF 40 L/min O2 Flow Rate (L/min): 2 l/min O2 Device: Endotracheal tube Temp (24hrs), Avg:98.7 F (37.1 C), Min:97.7 F (36.5 C), Max:99.8 F (37.7 C)    Intake/Output:     In: 878.8 (603.8 I.V.)  Out: 305 (305 Urine)   In: 3640.7 (3040.7 I.V.)  Out: 2810 (2050 Urine)     Intake/Output Summary (Last 24 hours) at 05/09/09 1318  Last data filed at 05/09/09 1200   Gross per 24 hour   Intake 2707.85 ml   Output   1565 ml   Net 1142.85 ml     General:No acute distress, not using accessory muscles of respiration  Head & Neck: + Pallor/ icterus/ no cyanosis/ LAD/ JVD. Oral mucosa moist, PERRL  Chest:Symmetrical coarse BS, no egophony/ rub/ crepts/ wheeze  Heart:RRR, no gallops/ rub/ murmur  Abdomen:Soft, non-tender, no organomegaly, bowel sounds normoactive  Extremities:No clubbing/ cyanosis/ edema  Skin:Warm, no rash  Neuro/ psych: Awake, + commands    LABS AND DATA: Personally reviewed  VENT: 10/500/40%, peep 5  CBC:12,6, 9, 56, P 75  BMP:153/3.8/124/22/24/0.9/120  ABG: 7.39/36/108  Cultures: reviewed  CXR: Patchy alveolar infiltrates  Tele: SR    MEDICATIONS: Reviewed for changes    ASSESSMENT:  Sepsis syndrome :underlying pneumonia, fever, leukocytosis  Broad spectrum antibiotics, GB sludge  Acute respiratory failure:intubated  with underlying Pneumonia in ICU, tolerating SBT  Altered mental status : multifactorial Sepsis, hypernatremia, ARF, hyperamonemia   Hypernatremia: worsening, c/w free water  thru NGT  Pneumonia: + MRSA, c/w levaquin , zosyn and Vancomycin  Nausea with vomiting (05/01/2009) resolved, negative KUB and US   Hypokalemia (05/01/2009)   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,c/w lactulose thru NGT  Gait abnormality (05/01/2009) B12, folate normal, PT/OT when MS improved  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 CCT exclusive of procedures: 49'     Thank you for allowing us  to participate in the care of this patient.    Kasey Setting, MD    Kasey Setting, MD, [facility]  [facility]         Electronically signed by Setting Kasey, MD at 05/09/2009  1:26 PM EDT

## 2009-05-09 NOTE — Progress Notes (Signed)
 Formatting of this note is different from the original.  Cardiology Progress Note    05/09/2009 10:13 AM    Admit Date: 04/29/2009    Subjective:     Patient in process of being extubated.    BP 124/49  Pulse 74  Temp 98.8 F (37.1 C)  Resp 31  Ht 5' 2 (1.575 m)  Wt 144 lb 13.5 oz (65.7 kg)  SpO2 100%  PF 40 L/min  Current facility-administered medications   Medication Dose Route Frequency   ? levofloxacin (LEVAQUIN) 750 mg infusion   750 mg IntraVENous Q24H   ? barium sulfate (READICAT) 2 % oral suspension 900 mL  900 mL Oral RAD ONCE   ? vancomycin (VANCOCIN) 0.75 g in 0.9% sodium chloride  250 mL IVPB  750 mg IntraVENous Q12H   ? 0.9% sodium chloride  infusion    IntraVENous RAD ONCE   ? ioversol (OPTIRAY) 350 mg/mL contrast solution 100 mL  100 mL IntraVENous RAD ONCE   ? sodium chloride  (NS) flush 10 mL  10 mL IntraVENous RAD ONCE   ? dextrose 5% infusion    IntraVENous CONTINUOUS   ? piperacillin-tazobactam (ZOSYN) 3.375 g in 0.9% sodium chloride  (MBP/ADV) 100 mL MBP  3.375 g IntraVENous Q6H   ? enoxaparin  (LOVENOX ) injection 70 mg  70 mg SubCUTAneous Q12H   ? VANCOMYCIN TROUGH LEVEL  1 Each Does Not Apply ONCE   ? levothyroxine (SYNTHROID) tablet 50 mcg  50 mcg Oral ACB   ? lactulose (CHRONULAC) solution 20 g  20 g Oral BID   ? aspirin (ASPIRIN) tablet 325 mg  325 mg Oral DAILY   ? albumin human 5% (BUMINATE) solution 25 g  25 g IntraVENous Q4H PRN   ? metoprolol  tartrate (LOPRESSOR ) injection 2.5 mg  2.5 mg IntraVENous Q6H   ? DISCONTD: vancomycin (VANCOCIN) 1 g in 0.9% sodium chloride  250 mL IVPB  1,000 mg IntraVENous Q24H   ? morphine  injection 1 mg  1 mg IntraVENous PRN   ? pantoprazole  (PROTONIX ) injection 40 mg  40 mg IntraVENous DAILY   ? albuterol /ipratropium (DUONEB ) neb solution   1 Dose Nebulization Q4HWA RT   ? midazolam (VERSED) injection 2-4 mg  2-4 mg IntraVENous Q1H PRN   ? sodium chloride  (NS) flush 10 mL  10 mL InterCATHeter Q8H   ? sodium chloride  (NS) flush 10 mL  10 mL InterCATHeter  PRN   ? heparin (porcine) pf 300 Units  300 Units InterCATHeter PRN   ? sodium chloride  (NS) flush 20 mL  20 mL InterCATHeter PRN   ? DISCONTD: levofloxacin (LEVAQUIN) 750 mg infusion   750 mg IntraVENous Q48H   ? acetaminophen  (TYLENOL ) suppository 650 mg  650 mg Rectal Q6H PRN   ? albuterol  (PROVENTIL  VENTOLIN ) nebulizer solution 2.5 mg  2.5 mg Nebulization Q2H PRN   ? thiamine (B-1) 100 mg in 0.9% sodium chloride  50 mL IVPB  100 mg IntraVENous DAILY   ? nitroglycerin (NITROBID) 2 % ointment 2 Inch  2 Inch Topical Q6H PRN   ? saline peripheral flush 5 mL  5 mL InterCATHeter PRN         Objective:     Physical Exam:  In process of being extubated  Chest--clear  CV--rrr Nl S1S2 I/VI sem  Abd--benign  Ext--unchanged    Data Review:   Labs:  Recent Results (from the past 24 hour(s))   BLOOD GAS, ARTERIAL    Collection Time    05/09/09  4:45 AM   Component  Value Range   ? pH 7.39  7.35 - 7.45 ( )   ? PCO2 36  35 - 45 (mmHg)   ? PO2 108 (*) 80 - 100 (mmHg)   ? O2 SAT 98 (*) 92 - 97 (%)   ? BICARBONATE 21 (*) 22 - 26 (mmol/L)   ? BASE DEFICIT 2.9 (*) BE NORMAL RANGE -3 T (mmol/L)   ? FIO2 40  (%)   ? SITE RR     ? MODE A/C     ? SET RATE 10  ( )   ? VT/PIP 500  ( )   ? PEEP/CPAP 5  ( )   ? SAMPLE SOURCE ARTERIAL     ? O2 METHOD VENTILATOR     CBC WITH AUTOMATED DIFF    Collection Time    05/09/09  5:10 AM   Component Value Range   ? WBC 12.6 (*) 3.6 - 11.0 (K/uL)   ? RBC 3.33 (*) 3.80 - 5.20 (M/uL)   ? HGB 9.0 (*) 11.5 - 16.0 (g/dL)   ? HCT 29.9 (*) 35.0 - 47.0 (%)   ? MCV 89.8  80.0 - 99.0 (FL)   ? MCH 27.0  26.0 - 34.0 (PG)   ? MCHC 30.1  30.0 - 36.5 (g/dL)   ? RDW 19.9 (*) 11.5 - 14.5 (%)   ? PLATELET 56 (*) 150 - 400 (K/uL)   ? NEUTROPHILS 75  32 - 75 (%)   ? LYMPHOCYTES 14  12 - 49 (%)   ? MONOCYTES 8  5 - 13 (%)   ? EOSINOPHILS 3  0 - 7 (%)   ? BASOPHILS 0  0 - 1 (%)   ? ABSOLUTE NEUTS 9.4 (*) 1.8 - 8.0 (K/UL)   ? ABSOLUTE LYMPHS 1.8  0.8 - 3.5 (K/UL)   ? ABSOLUTE MONOS 1.0  0.0 - 1.0 (K/UL)   ? ABSOLUTE EOSINS 0.4   0.0 - 0.4 (K/UL)   ? ABSOLUTE BASOS 0.0  0.0 - 0.1 (K/UL)   ? RBC COMMENTS 1+ ANISOCYTOSIS     ? DF SMEAR SCANNED     METABOLIC PANEL, COMPREHENSIVE    Collection Time    05/09/09  5:10 AM   Component Value Range   ? Sodium 153 (*) 136 - 145 (MMOL/L)   ? Potassium 3.8  3.5 - 5.1 (MMOL/L)   ? Chloride 124 (*) 97 - 108 (MMOL/L)   ? CO2 22  21 - 32 (MMOL/L)   ? Anion gap 7  5 - 15 (mmol/L)   ? Glucose 120 (*) 65 - 100 (MG/DL)   ? BUN 24 (*) 6 - 20 (MG/DL)   ? Creatinine 0.9  0.6 - 1.3 (MG/DL)   ? BUN/Creatinine ratio 27 (*) 12 - 20 ( )   ? GFR est AA >60  >60 (ml/min/1.2m2)   ? GFR est non-AA >60  >60 (ml/min/1.78m2)   ? Calcium 8.5  8.5 - 10.1 (MG/DL)   ? Bilirubin, total 4.1 (*) 0.2 - 1.0 (MG/DL)   ? ALT 82 (*) 12 - 78 (U/L)   ? AST 52 (*) 15 - 37 (U/L)   ? Alk. phosphatase 110  50 - 136 (U/L)   ? Protein, total 5.7 (*) 6.4 - 8.2 (g/dL)   ? Albumin 2.2 (*) 3.5 - 5.0 (g/dL)   ? Globulin 3.5  2.0 - 4.0 (g/dL)   ? A-G Ratio 0.6 (*) 1.1 - 2.2 ( )   MAGNESIUM   Collection Time    05/09/09  5:10 AM   Component Value Range   ? Magnesium  2.4  1.6 - 2.4 (MG/DL)   AMMONIA    Collection Time    05/09/09  5:10 AM   Component Value Range   ? Ammonia 27  <32 (UMOL/L)   CK    Collection Time    05/09/09  5:10 AM   Component Value Range   ? CK 229 (*) 21 - 215 (U/L)   URINALYSIS W/MICROSCOPIC    Collection Time    05/09/09 11:05 AM   Component Value Range   ? Color DARK YELLOW     ? Appearance CLEAR     ? Specific gravity 1.024  1.003 - 1.030 ( )   ? pH 6.0  5.0 - 8.0 ( )   ? Protein TRACE (*) NEGATIVE (MG/DL)   ? Glucose NEGATIVE   NEGATIVE (MG/DL)   ? Ketone NEGATIVE   NEGATIVE (MG/DL)   ? Blood NEGATIVE   NEGATIVE    ? Urobilinogen 0.2  0.2 - 1.0 (EU/DL)   ? Nitrites NEGATIVE   NEGATIVE    ? Leukocyte Esterase SMALL (*) NEGATIVE    ? WBC 5-10  0 - 4 (/HPF)   ? RBC 10-20  0 - 5 (/HPF)   ? Epithelial cells 0-5  0 - 5 (/LPF)   ? Bacteria NEGATIVE   NEGATIVE (/HPF)   ? Hyaline Cast 0-2  0 - 2    BILIRUBIN, CONFIRM    Collection Time     05/09/09 11:05 AM   Component Value Range   ? Ictotest POSITIVE (*) NEGATIVE    BLOOD GAS, ARTERIAL    Collection Time    05/09/09  2:07 PM   Component Value Range   ? pH 7.42  7.35 - 7.45 ( )   ? PCO2 30 (*) 35 - 45 (mmHg)   ? PO2 134 (*) 80 - 100 (mmHg)   ? O2 SAT 99 (*) 92 - 97 (%)   ? BICARBONATE 19 (*) 22 - 26 (mmol/L)   ? BASE DEFICIT 3.9 (*) BE NORMAL RANGE -3 T (mmol/L)   ? FIO2 40  (%)   ? SITE RR     ? MODE CPAP     ? SPONTANEOUS RATE 33  ( )   ? PEEP/CPAP 5  ( )   ? PRESSURE SUPPORT 5  ( )   ? SAMPLE SOURCE ARTERIAL     ? O2 METHOD VENTILATOR     ? RESPIRATORY COMMENT  CVRB TO DR COOPER @ 1418     BLOOD GAS, ARTERIAL    Collection Time    05/09/09  6:44 PM   Component Value Range   ? pH 7.41  7.35 - 7.45 ( )   ? PCO2 37  35 - 45 (mmHg)   ? PO2 106 (*) 80 - 100 (mmHg)   ? O2 SAT 98 (*) 92 - 97 (%)   ? BICARBONATE 22  22 - 26 (mmol/L)   ? BASE DEFICIT 1.8 (*) BE NORMAL RANGE -3 T (mmol/L)   ? FIO2 40  (%)   ? SITE RR     ? MODE CPAP     ? SPONTANEOUS RATE 32  ( )   ? PEEP/CPAP 5  ( )   ? PRESSURE SUPPORT 5  ( )   ? SAMPLE SOURCE ARTERIAL     ? O2 METHOD VENTILATOR  Telemetry: Normal sinus rhythm at 84 bpm.    Assessment:     Patient Active Hospital Problem List:    Sepsis syndrome    Acute respiratory insufficiency    MRSA Pneumonia    AMS    Nausea with vomiting (05/01/2009)    Hypokalemia (05/01/2009)    Hepatic encephalopathy (05/01/2009)    Gait abnormality (05/01/2009)    Alcoholic cirrhosis of liver (05/01/2009)    Hypomagnesemia (05/01/2009)    Plan:     Extubation in progress  Continue supportive therapy  ASA, beta blocker, lovenox     Archer L. Wonda, MD      Electronically signed by Wonda Woodie CROME at 05/09/2009  8:52 PM EDT

## 2009-05-09 NOTE — Progress Notes (Signed)
 Formatting of this note might be different from the original.  Dr. Mordechai called with ABG's results and updated on pt status.  Dr. Mordechai updated on normal ABG's and pt RR 35 and feeling tired.  Dr. Mordechai updated when pt was told she was going to be extubated she noded her head side to side and feels she's not ready.  Dr. Mordechai ordered another set of ABG's.  Those ABG's results where called back to Dr. Mordechai and Dr. Mordechai states to extubate pt with PRN BiPAP 14/6 for a couple of hours.  Do not reintubate pt.  Electronically signed by Marlowe Heron PARAS, RN at 05/09/2009  8:01 PM EDT

## 2009-05-09 NOTE — Progress Notes (Signed)
 Formatting of this note might be different from the original.  Right triple lumen PICC line dressing changed per protocol.  No redness or swelling noted.  External length of catheter 0 cms.  Brisk blood return from red and white ports.  Heparin flush instilled to gray port.  Electronically signed by Claudene Tobias CROME, RN at 05/09/2009  6:10 PM EDT

## 2009-05-09 NOTE — Progress Notes (Signed)
 Formatting of this note might be different from the original.  Received SBAR bedside/verbal report from B Compton, Charity fundraiser. Reviewed Admission, Kardex, and SBAR reports. Reviewed assessment, vitals signs, lab results, and orders from previous shift. The patient has stable vital signs and is no acute distress. Per B Compton, RN, pt shock head left-to-right regarding plan to extubate and Dr Cooper is aware. Dr Cooper has ordered extubation and BiPap if needed. TF is on hold pending extubation.    Electronically signed by Renato Rosaline CROME, RN at 05/09/2009  7:53 PM EDT

## 2009-05-09 NOTE — Progress Notes (Signed)
 Formatting of this note might be different from the original.  Spoke with Dr. Guy concerning loose stools, and clarification of Hospitalist orders to stop IVF when TF started versus pulmonary orders to start IVF.  Order obtained for [facility] and placed.  Dr. Guy states to restart IVF.  MDs may reevaluate in AM.  Electronically signed by Carlyon Lye L at 05/09/2009 12:40 AM EDT

## 2009-05-09 NOTE — Progress Notes (Signed)
 Formatting of this note might be different from the original.  Gave SBAR bedside/verbal report to KATHEE Richard, Charity fundraiser. Reviewed Admission, Kardex, and SBAR reports. Reviewed assessment, vitals signs, lab results, and orders from previous shift. The patient has stable vital signs and is no acute distress.      Electronically signed by Renato Rosaline CROME, RN at 05/09/2009 10:52 PM EDT

## 2009-05-09 NOTE — Progress Notes (Signed)
Received SBAR bedside/verbal report from B Compton, Charity fundraiser. Reviewed Admission, Kardex, and SBAR reports. Reviewed assessment, vitals signs, lab results, and orders from previous shift. The patient has stable vital signs and is no acute distress. Per B Compton, RN, pt shock head left-to-right regarding plan to extubate and Dr Fraser Din is aware. Dr Fraser Din has ordered extubation and BiPap if needed. TF is on hold pending extubation.

## 2009-05-09 NOTE — Progress Notes (Signed)
Gave SBAR bedside/verbal report to Lelon Frohlich, Charity fundraiser. Reviewed Admission, Kardex, and SBAR reports. Reviewed assessment, vitals signs, lab results, and orders from previous shift. The patient has stable vital signs and is no acute distress.

## 2009-05-09 NOTE — Progress Notes (Signed)
Spoke with Dr. Tammi Klippel concerning loose stools, and clarification of Hospitalist orders to stop IVF when TF started versus pulmonary orders to start IVF.  Order obtained for [facility] and placed.  Dr. Tammi Klippel states to restart IVF.  MDs may reevaluate in AM.

## 2009-05-09 NOTE — Progress Notes (Signed)
[facility] ??? Physician Progress Note  Pulmonary, Critical Care and Sleep Medicine  Name: Breanna Lester DOB:  12/02/1960 MRN:  [id number] Date:  05/09/2009 1:18 PM    I have reviewed the flow sheet and previous notes  COMPLAINTS:   Sedation held, awake on rounds, on SBT- PS 5, VTE 350+, comfortable  ROS: As mentioned in complaints, all other comprehensive review of systems is limited    EXAMINATION:  Vital Signs:         BP 104/40   Pulse 74   Temp 98.8 ??F (37.1 ??C)   Resp 17   Ht 5\' 2"  (1.575 m)   Wt 144 lb 13.5 oz (65.7 kg)   SpO2 100%   PF 40 L/min O2 Flow Rate (L/min): 2 l/min O2 Device: Endotracheal tube Temp (24hrs), Avg:98.7 ??F (37.1 ??C), Min:97.7 ??F (36.5 ??C), Max:99.8 ??F (37.7 ??C)    Intake/Output:     In: 878.8 (603.8 I.V.)  Out: 305 (305 Urine)   In: 3640.7 (3040.7 I.V.)  Out: 2810 (2050 Urine)     Intake/Output Summary (Last 24 hours) at 05/09/09 1318  Last data filed at 05/09/09 1200   Gross per 24 hour   Intake 2707.85 ml   Output   1565 ml   Net 1142.85 ml       General:??????????????????No acute distress, not using accessory muscles of respiration  Head & Neck: + Pallor/ icterus/ no cyanosis/ LAD/ JVD. Oral mucosa moist, PERRL  Chest:??????????????????????????Symmetrical coarse BS, no egophony/ rub/ crepts/ wheeze  Heart:????????????????????????????RRR, no gallops/ rub/ murmur  Abdomen:????????????Soft, non-tender, no organomegaly, bowel sounds normoactive  Extremities:????????No clubbing/ cyanosis/ edema  Skin:??????????????????????????????Warm, no rash  Neuro/ psych:?? Awake, + commands    LABS AND?? DATA: Personally reviewed  VENT: 10/500/40%, peep 5  CBC:12,6, 9, 56, P 75  BMP:153/3.8/124/22/24/0.9/120  ABG: 7.39/36/108  Cultures: reviewed  CXR: Patchy alveolar infiltrates  Tele: SR    MEDICATIONS: Reviewed for changes    ASSESSMENT:??  Sepsis syndrome :underlying pneumonia, fever, leukocytosis  ????????????????????????Broad spectrum antibiotics, GB sludge   Acute respiratory failure:intubated with underlying Pneumonia in ICU, tolerating SBT  Altered mental status : multifactorial Sepsis, hypernatremia, ARF, hyperamonemia ??  Hypernatremia: worsening, c/w free water thru NGT  Pneumonia: + MRSA, c/w levaquin , zosyn and Vancomycin  Nausea with vomiting (05/01/2009) resolved, negative KUB and [place]  Hypokalemia (05/01/2009)?? ??  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 CCT exclusive of procedures: 30' ??  ????  Thank you for allowing [place] to participate in the care of this patient.    Darra Lis, MD    Darra Lis, MD, [facility]  [facility]

## 2009-05-09 NOTE — Progress Notes (Signed)
Dr. Collene Schlichter called with ABG's results and updated on pt status.  Dr. Collene Schlichter updated on normal ABG's and pt RR 35 and feeling tired.  Dr. Collene Schlichter updated when pt was told she was going to be extubated she noded her head side to side and feels she's not ready.  Dr. Collene Schlichter ordered another set of ABG's.  Those ABG's results where called back to Dr. Collene Schlichter and Dr. Collene Schlichter states to extubate pt with PRN BiPAP 14/6 for a couple of hours.  Do not reintubate pt.

## 2009-05-09 NOTE — Progress Notes (Signed)
Cardiology Progress Note      05/09/2009 10:13 AM    Admit Date: 04/29/2009        Subjective:     Patient in process of being extubated.    BP 124/49   Pulse 74   Temp 98.8 ??F (37.1 ??C)   Resp 31   Ht 5\' 2"  (1.575 m)   Wt 144 lb 13.5 oz (65.7 kg)   SpO2 100%   PF 40 L/min  Current facility-administered medications   Medication Dose Route Frequency   ??? levofloxacin (LEVAQUIN) 750 mg infusion   750 mg IntraVENous Q24H   ??? barium sulfate (READICAT) 2 % oral suspension 900 mL  900 mL Oral RAD ONCE   ??? vancomycin (VANCOCIN) 0.75 g in 0.9% sodium chloride 250 mL IVPB  750 mg IntraVENous Q12H   ??? 0.9% sodium chloride infusion    IntraVENous RAD ONCE   ??? ioversol (OPTIRAY) 350 mg/mL contrast solution 100 mL  100 mL IntraVENous RAD ONCE   ??? sodium chloride (NS) flush 10 mL  10 mL IntraVENous RAD ONCE   ??? dextrose 5% infusion    IntraVENous CONTINUOUS   ??? piperacillin-tazobactam (ZOSYN) 3.375 g in 0.9% sodium chloride (MBP/ADV) 100 mL MBP  3.375 g IntraVENous Q6H   ??? enoxaparin (LOVENOX) injection 70 mg  70 mg SubCUTAneous Q12H   ??? VANCOMYCIN TROUGH LEVEL  1 Each Does Not Apply ONCE   ??? levothyroxine (SYNTHROID) tablet 50 mcg  50 mcg Oral ACB   ??? lactulose (CHRONULAC) solution 20 g  20 g Oral BID   ??? aspirin (ASPIRIN) tablet 325 mg  325 mg Oral DAILY   ??? albumin human 5% (BUMINATE) solution 25 g  25 g IntraVENous Q4H PRN   ??? metoprolol tartrate (LOPRESSOR) injection 2.5 mg  2.5 mg IntraVENous Q6H   ??? DISCONTD: vancomycin (VANCOCIN) 1 g in 0.9% sodium chloride 250 mL IVPB  1,000 mg IntraVENous Q24H   ??? morphine injection 1 mg  1 mg IntraVENous PRN   ??? pantoprazole (PROTONIX) injection 40 mg  40 mg IntraVENous DAILY   ??? albuterol/ipratropium (DUONEB) neb solution   1 Dose Nebulization Q4HWA RT   ??? midazolam (VERSED) injection 2-4 mg  2-4 mg IntraVENous Q1H PRN   ??? sodium chloride (NS) flush 10 mL  10 mL InterCATHeter Q8H   ??? sodium chloride (NS) flush 10 mL  10 mL InterCATHeter PRN    ??? heparin (porcine) pf 300 Units  300 Units InterCATHeter PRN   ??? sodium chloride (NS) flush 20 mL  20 mL InterCATHeter PRN   ??? DISCONTD: levofloxacin (LEVAQUIN) 750 mg infusion   750 mg IntraVENous Q48H   ??? acetaminophen (TYLENOL) suppository 650 mg  650 mg Rectal Q6H PRN   ??? albuterol (PROVENTIL VENTOLIN) nebulizer solution 2.5 mg  2.5 mg Nebulization Q2H PRN   ??? thiamine (B-1) 100 mg in 0.9% sodium chloride 50 mL IVPB  100 mg IntraVENous DAILY   ??? nitroglycerin (NITROBID) 2 % ointment 2 Inch  2 Inch Topical Q6H PRN   ??? saline peripheral flush 5 mL  5 mL InterCATHeter PRN             Objective:      Physical Exam:  In process of being extubated  Chest--clear  CV--rrr Nl S1S2 I/VI sem  Abd--benign  Ext--unchanged      Data Review:   Labs:  Recent Results (from the past 24 hour(s))   BLOOD GAS, ARTERIAL    Collection Time  05/09/09  4:45 AM   Component Value Range   ??? pH 7.39  7.35 - 7.45 ( )   ??? PCO2 36  35 - 45 (mmHg)   ??? PO2 108 (*) 80 - 100 (mmHg)   ??? O2 SAT 98 (*) 92 - 97 (%)   ??? BICARBONATE 21 (*) 22 - 26 (mmol/L)   ??? BASE DEFICIT 2.9 (*) BE NORMAL RANGE -3 T (mmol/L)   ??? FIO2 40  (%)   ??? SITE RR     ??? MODE A/C     ??? SET RATE 10  ( )   ??? VT/PIP 500  ( )   ??? PEEP/CPAP 5  ( )   ??? SAMPLE SOURCE ARTERIAL     ??? O2 METHOD VENTILATOR     CBC WITH AUTOMATED DIFF    Collection Time    05/09/09  5:10 AM   Component Value Range   ??? WBC 12.6 (*) 3.6 - 11.0 (K/uL)   ??? RBC 3.33 (*) 3.80 - 5.20 (M/uL)   ??? HGB 9.0 (*) 11.5 - 16.0 (g/dL)   ??? HCT 29.9 (*) 35.0 - 47.0 (%)   ??? MCV 89.8  80.0 - 99.0 (FL)   ??? MCH 27.0  26.0 - 34.0 (PG)   ??? MCHC 30.1  30.0 - 36.5 (g/dL)   ??? RDW 19.9 (*) 11.5 - 14.5 (%)   ??? PLATELET 56 (*) 150 - 400 (K/uL)   ??? NEUTROPHILS 75  32 - 75 (%)   ??? LYMPHOCYTES 14  12 - 49 (%)   ??? MONOCYTES 8  5 - 13 (%)   ??? EOSINOPHILS 3  0 - 7 (%)   ??? BASOPHILS 0  0 - 1 (%)   ??? ABSOLUTE NEUTS 9.4 (*) 1.8 - 8.0 (K/UL)   ??? ABSOLUTE LYMPHS 1.8  0.8 - 3.5 (K/UL)   ??? ABSOLUTE MONOS 1.0  0.0 - 1.0 (K/UL)    ??? ABSOLUTE EOSINS 0.4  0.0 - 0.4 (K/UL)   ??? ABSOLUTE BASOS 0.0  0.0 - 0.1 (K/UL)   ??? RBC COMMENTS 1+ ANISOCYTOSIS     ??? DF SMEAR SCANNED     METABOLIC PANEL, COMPREHENSIVE    Collection Time    05/09/09  5:10 AM   Component Value Range   ??? Sodium 153 (*) 136 - 145 (MMOL/L)   ??? Potassium 3.8  3.5 - 5.1 (MMOL/L)   ??? Chloride 124 (*) 97 - 108 (MMOL/L)   ??? CO2 22  21 - 32 (MMOL/L)   ??? Anion gap 7  5 - 15 (mmol/L)   ??? Glucose 120 (*) 65 - 100 (MG/DL)   ??? BUN 24 (*) 6 - 20 (MG/DL)   ??? Creatinine 0.9  0.6 - 1.3 (MG/DL)   ??? BUN/Creatinine ratio 27 (*) 12 - 20 ( )   ??? GFR est AA >60  >60 (ml/min/1.16m2)   ??? GFR est non-AA >60  >60 (ml/min/1.61m2)   ??? Calcium 8.5  8.5 - 10.1 (MG/DL)   ??? Bilirubin, total 4.1 (*) 0.2 - 1.0 (MG/DL)   ??? ALT 82 (*) 12 - 78 (U/L)   ??? AST 52 (*) 15 - 37 (U/L)   ??? Alk. phosphatase 110  50 - 136 (U/L)   ??? Protein, total 5.7 (*) 6.4 - 8.2 (g/dL)   ??? Albumin 2.2 (*) 3.5 - 5.0 (g/dL)   ??? Globulin 3.5  2.0 - 4.0 (g/dL)   ??? A-G Ratio 0.6 (*) 1.1 - 2.2 ( )  MAGNESIUM    Collection Time    05/09/09  5:10 AM   Component Value Range   ??? Magnesium 2.4  1.6 - 2.4 (MG/DL)   AMMONIA    Collection Time    05/09/09  5:10 AM   Component Value Range   ??? Ammonia 27  <32 (UMOL/L)   CK    Collection Time    05/09/09  5:10 AM   Component Value Range   ??? CK 229 (*) 21 - 215 (U/L)   URINALYSIS W/MICROSCOPIC    Collection Time    05/09/09 11:05 AM   Component Value Range   ??? Color DARK YELLOW     ??? Appearance CLEAR     ??? Specific gravity 1.024  1.003 - 1.030 ( )   ??? pH 6.0  5.0 - 8.0 ( )   ??? Protein TRACE (*) NEGATIVE (MG/DL)   ??? Glucose NEGATIVE   NEGATIVE (MG/DL)   ??? Ketone NEGATIVE   NEGATIVE (MG/DL)   ??? Blood NEGATIVE   NEGATIVE    ??? Urobilinogen 0.2  0.2 - 1.0 (EU/DL)   ??? Nitrites NEGATIVE   NEGATIVE    ??? Leukocyte Esterase SMALL (*) NEGATIVE    ??? WBC 5-10  0 - 4 (/HPF)   ??? RBC 10-20  0 - 5 (/HPF)   ??? Epithelial cells 0-5  0 - 5 (/LPF)   ??? Bacteria NEGATIVE   NEGATIVE (/HPF)   ??? Hyaline Cast 0-2  0 - 2     BILIRUBIN, CONFIRM    Collection Time    05/09/09 11:05 AM   Component Value Range   ??? Ictotest POSITIVE (*) NEGATIVE    BLOOD GAS, ARTERIAL    Collection Time    05/09/09  2:07 PM   Component Value Range   ??? pH 7.42  7.35 - 7.45 ( )   ??? PCO2 30 (*) 35 - 45 (mmHg)   ??? PO2 134 (*) 80 - 100 (mmHg)   ??? O2 SAT 99 (*) 92 - 97 (%)   ??? BICARBONATE 19 (*) 22 - 26 (mmol/L)   ??? BASE DEFICIT 3.9 (*) BE NORMAL RANGE -3 T (mmol/L)   ??? FIO2 40  (%)   ??? SITE RR     ??? MODE CPAP     ??? SPONTANEOUS RATE 33  ( )   ??? PEEP/CPAP 5  ( )   ??? PRESSURE SUPPORT 5  ( )   ??? SAMPLE SOURCE ARTERIAL     ??? O2 METHOD VENTILATOR     ??? RESPIRATORY COMMENT  CVRB TO DR Fraser Din @ 1418     BLOOD GAS, ARTERIAL    Collection Time    05/09/09  6:44 PM   Component Value Range   ??? pH 7.41  7.35 - 7.45 ( )   ??? PCO2 37  35 - 45 (mmHg)   ??? PO2 106 (*) 80 - 100 (mmHg)   ??? O2 SAT 98 (*) 92 - 97 (%)   ??? BICARBONATE 22  22 - 26 (mmol/L)   ??? BASE DEFICIT 1.8 (*) BE NORMAL RANGE -3 T (mmol/L)   ??? FIO2 40  (%)   ??? SITE RR     ??? MODE CPAP     ??? SPONTANEOUS RATE 32  ( )   ??? PEEP/CPAP 5  ( )   ??? PRESSURE SUPPORT 5  ( )   ??? SAMPLE SOURCE ARTERIAL     ??? O2 METHOD VENTILATOR  Telemetry: Normal sinus rhythm at 84 bpm.      Assessment:     Patient Active Hospital Problem List:    Sepsis syndrome    Acute respiratory insufficiency    MRSA Pneumonia    AMS    Nausea with vomiting (05/01/2009)    Hypokalemia (05/01/2009)    Hepatic encephalopathy (05/01/2009)    Gait abnormality (05/01/2009)    Alcoholic cirrhosis of liver (05/01/2009)    Hypomagnesemia (05/01/2009)      Plan:     Extubation in progress  Continue supportive therapy  ASA, beta blocker, lovenox        Fount Bahe L. Vita Barley, MD

## 2009-05-09 NOTE — Progress Notes (Signed)
Pharmacokinetic monitor: vancomycin     S/O:   48 yoF being treated for sepsis syndrome with underlying pneumonia, fever, leukocytosis.   Currently on vancomycin, levofloxacin, and zosyn.     Ht: 62'' Wt: 65.7 kg   Scr: 0.9 BUN: 24 Est CrCl: 63 mL/min   Vancomycin trough: 6.5    A/P:   Vancomycin (true) trough came back 6.5; below goal range of 15-20 (likely due to improving renal  function).   Will increase vancomycin dose to 750 mg IV every 12 hours, to start today (9/19) at 1400.     Goal trough: 15-20.    Pharmacy will continue to monitor.    Thank you,  Sharlyn Bologna, PharmD

## 2009-05-09 NOTE — Progress Notes (Signed)
Hospitalist Progress Note         NAME: Breanna Lester        DOB:  1960/12/11        MRN:  [id number]      Assessment & Plan   Sepsis syndrome :underlying pneumonia, fever, leukocytosis  ????????????????????????Broad spectrum antibiotics.  ????????????????????????No evidence of UTI or ascites(SBP), has an umbilical hernia that is not     reducible but overall adbomen is soft, has RUQ pain, US gallbladder sludge, obtain CT abdomen    Acute respiratory failure:intubated with underlying Pneumonia in ICU    Altered mental status : multifactorial Sepsis, hypernatremia, ARF, hyperamonemia     Hypernatremia: improving, c/w free water thru NGT    Pneumonia: c/w levaquin , zosyn and Vancomycin    Nausea with vomiting (05/01/2009) resolved, negative KUB and [place]    Hypokalemia (05/01/2009)  continue supplement KCL as needed  ????????????????????????Recheck labs    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,c/w lactulose thru NGT    Gait abnormality (05/01/2009) B12, folate normal, PT/OT when MS improved    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     Review of Systems:  Fever/chills y   Cough N   Sputum    N   SOB/DOE  N   Chest Pain N   Abdominal Pain N   Diarrhea N   Constipation N   Nausea/Vomiting N   Dysuria N   Tolerating PT N   Tolerating Diet N   Could not obtain due to AMS vs intubation xx        Objective:    Patient intubated this AM with acute respiratory failure, critically ill, sedation was D/C more awake today does not follow commands, started feeding thru NGT, abdomen painful in RUQ will obtain CT abdomen    VITALS:   Last 24hrs VS reviewed since prior progress note. Most recent are:  Visit Vitals   Item Reading   ??? BP 113/49   ??? Pulse 72   ??? Temp 99.7 ??F (37.6 ??C)   ??? Resp 20   ??? Ht 5\' 2"  (1.575 m)   ??? Wt 144 lb 13.5 oz (65.7 kg)   ??? SpO2 99%   ??? PF 40L/min           Intake/Output Summary (Last 24 hours) at 05/09/09 0738  Last data filed at 05/09/09 0600   Gross per 24 hour   Intake 2029.1 ml   Output   1560 ml   Net  469.1 ml        Telemetry Reviewed:     PHYSICAL EXAM:  General: Intubated, sedated????  HEENT: Pupils unequal, R 3mm, L 2mm  Lungs:  Bilateral rales, ronchi and mild wheezing.  Heart:  Regular rhythm, tachycardic, SEM  Abdomen: Soft, Non distended, hypoactive BS, no rebound, umbilical hernia non reducible, pain in RUQ  Extremities: No edema, pulses present, brace in left arm  Neurologic:?? GCS [id number], no gross deficit  Psych:???? AMS    Lab Data Reviewed: (see below)    Medications Reviewed: (see below)    PMH/SH reviewed - no change compared to H&P  ______________________________________________________________________  Total time spent with patient: 45 minutes     Critical Care Provided     Minutes non procedure based    Care Plan discussed with:  Patient    Family    RN x   Care Manager    Consultant/Specialist      >50% of visit spent in counseling and coordination of care       Prophylaxis:  GI PPI   DVT lovenox     Disposition:   Home with Family    HH/PT/OT/RN    SNF/LTC    [facility]      ______________________________________________________________________  Attending Physician: Hazeline Junker, MD   _____________________________________________________________________________________________________  Procedures: see electronic medical records for all procedures/Xrays and details   which were not copied into this note but were reviewed prior to creation of Plan.      LABS:  Recent Labs   Basename 05/09/09 0510 05/08/09 0415   ??? WBC 12.6* 10.3   ??? HGB 9.0* 8.5*   ??? HCT 29.9* 28.5*   ??? PLT 56* 65*         Recent Labs   Basename 05/09/09 0510 05/08/09 0415 05/07/09 0400   ??? NA 153* 160* 155*   ??? K 3.8 3.6 3.7   ??? CL 124* 126* 125*   ??? CO2 22 21 24    ??? BUN 24* 29* 40*   ??? CREA 0.9 1.1 1.1   ??? GLU 120* 95 160*   ??? CA 8.5 8.0* 9.2   ??? MG 2.4 -- 3.0*   ??? PHOS -- -- --   ??? URICA -- -- --         Recent Labs   Basename 05/09/09 0510 05/08/09 0415 05/07/09 0400   ??? SGOT 52* 54* 78*   ??? GPT 82* 83* 99*   ??? AP 110 105 125   ??? TBIL 4.1* 3.8* 4.2*   ??? TP 5.7* 5.6* 6.0*   ??? ALB 2.2* 2.1* 2.4*   ??? GLOB 3.5 3.5 3.6   ??? GGT -- -- --   ??? AML -- -- --   ??? LPSE -- -- --         Recent Labs   Basename 05/07/09 0400   ??? INR 1.7*   ??? PTP 16.9*   ??? APTT --          No results found for this basename: FE:2,TIBC:2,PSAT:2,FERR:2, in the last 72 hours   Recent Labs   Basename 05/09/09 0445 05/08/09 0438   ??? PH 7.39 7.42   ??? PCO2 36 33*   ??? PO2 108* 129*         Recent Labs   Basename 05/09/09 0510   ??? CPK 229*   ??? CKMB --         Lab Results   Component Value Date/Time    POC GLUCOSE 114 05/01/2009  4:40 PM    POC GLUCOSE 162 05/01/2009 12:02 PM    POC GLUCOSE 115 05/01/2009  8:39 AM         Lab Results   Component Value Date/Time    Color YELLOW 04/30/2009 12:14 AM    Appearance CLEAR 04/30/2009 12:14 AM    Specific gravity 1.010 10/29/2008 11:00 AM    Specific gravity 1.011 04/30/2009 12:14 AM  pH 6.0 04/30/2009 12:14 AM    Protein NEGATIVE  04/30/2009 12:14 AM    Glucose NEGATIVE  04/30/2009 12:14 AM    Ketone NEGATIVE  04/30/2009 12:14 AM    Bilirubin NEGATIVE  04/30/2009 12:14 AM    Urobilinogen 2.0 04/30/2009 12:14 AM    Nitrites NEGATIVE  04/30/2009 12:14 AM    Leukocyte Esterase TRACE 04/30/2009 12:14 AM    Epithelial cells 10-20 04/30/2009 12:14 AM    Bacteria NEGATIVE  04/30/2009 12:14 AM    WBC 0-4 04/30/2009 12:14 AM     RBC 0-3 04/30/2009 12:14 AM           MEDICATIONS:  Current facility-administered medications   Medication Dose Route Frequency   ??? levofloxacin (LEVAQUIN) 750 mg infusion   750 mg IntraVENous Q24H   ??? dextrose 5% infusion    IntraVENous CONTINUOUS   ??? piperacillin-tazobactam (ZOSYN) 3.375 g in 0.9% sodium chloride (MBP/ADV) 100 mL MBP  3.375 g IntraVENous Q6H   ??? enoxaparin (LOVENOX) injection 70 mg  70 mg SubCUTAneous Q12H   ??? VANCOMYCIN TROUGH LEVEL  1 Each Does Not Apply ONCE   ??? DISCONTD: dextrose 5% with KCl 20 mEq/L infusion    IntraVENous CONTINUOUS   ??? levothyroxine (SYNTHROID) tablet 50 mcg  50 mcg Oral ACB   ??? lactulose (CHRONULAC) solution 20 g  20 g Oral BID   ??? aspirin (ASPIRIN) tablet 325 mg  325 mg Oral DAILY   ??? albumin human 5% (BUMINATE) solution 25 g  25 g IntraVENous Q4H PRN   ??? vancomycin (VANCOCIN) 1 g in 0.9% sodium chloride 250 mL IVPB  1,000 mg IntraVENous Q24H   ??? metoprolol tartrate (LOPRESSOR) injection 2.5 mg  2.5 mg IntraVENous Q6H   ??? morphine injection 1 mg  1 mg IntraVENous PRN   ??? pantoprazole (PROTONIX) injection 40 mg  40 mg IntraVENous DAILY   ??? albuterol/ipratropium (DUONEB) neb solution   1 Dose Nebulization Q4HWA RT   ??? midazolam (VERSED) injection 2-4 mg  2-4 mg IntraVENous Q1H PRN   ??? sodium chloride (NS) flush 10 mL  10 mL InterCATHeter Q8H   ??? sodium chloride (NS) flush 10 mL  10 mL InterCATHeter PRN   ??? heparin (porcine) pf 300 Units  300 Units InterCATHeter PRN   ??? sodium chloride (NS) flush 20 mL  20 mL InterCATHeter PRN   ??? DISCONTD: levofloxacin (LEVAQUIN) 750 mg infusion   750 mg IntraVENous Q48H   ??? acetaminophen (TYLENOL) suppository 650 mg  650 mg Rectal Q6H PRN   ??? albuterol (PROVENTIL VENTOLIN) nebulizer solution 2.5 mg  2.5 mg Nebulization Q2H PRN   ??? thiamine (B-1) 100 mg in 0.9% sodium chloride 50 mL IVPB  100 mg IntraVENous DAILY   ??? nitroglycerin (NITROBID) 2 % ointment 2 Inch  2 Inch Topical Q6H PRN    ??? saline peripheral flush 5 mL  5 mL InterCATHeter PRN

## 2009-05-09 NOTE — Progress Notes (Signed)
Right triple lumen PICC line dressing changed per protocol.  No redness or swelling noted.  External length of catheter 0 cms.  Brisk blood return from red and white ports.  Heparin flush instilled to gray port.

## 2009-05-10 LAB — METABOLIC PANEL, COMPREHENSIVE
A-G Ratio: 0.6 — ABNORMAL LOW (ref 1.1–2.2)
ALT (SGPT): 71 U/L (ref 12–78)
AST (SGOT): 41 U/L — ABNORMAL HIGH (ref 15–37)
Albumin: 2.2 g/dL — ABNORMAL LOW (ref 3.5–5.0)
Alk. phosphatase: 112 U/L (ref 50–136)
Anion gap: 9 mmol/L (ref 5–15)
BUN/Creatinine ratio: 19 (ref 12–20)
BUN: 15 MG/DL (ref 6–20)
Bilirubin, total: 3.9 MG/DL — ABNORMAL HIGH (ref 0.2–1.0)
CO2: 22 MMOL/L (ref 21–32)
Calcium: 8.7 MG/DL (ref 8.5–10.1)
Chloride: 118 MMOL/L — ABNORMAL HIGH (ref 97–108)
Creatinine: 0.8 MG/DL (ref 0.6–1.3)
GFR est AA: >60 mL/min/{1.73_m2} (ref >60)
GFR est non-AA: >60 mL/min/{1.73_m2} (ref >60)
Globulin: 3.4 g/dL (ref 2.0–4.0)
Glucose: 114 MG/DL — ABNORMAL HIGH (ref 65–100)
Potassium: 3.5 MMOL/L (ref 3.5–5.1)
Protein, total: 5.6 g/dL — ABNORMAL LOW (ref 6.4–8.2)
Sodium: 149 MMOL/L — ABNORMAL HIGH (ref 136–145)

## 2009-05-10 LAB — CBC WITH AUTOMATED DIFF
ABS. BASOPHILS: 0 10*3/uL (ref 0.0–0.1)
ABS. EOSINOPHILS: 0.3 10*3/uL (ref 0.0–0.4)
ABS. LYMPHOCYTES: 1.8 10*3/uL (ref 0.8–3.5)
ABS. MONOCYTES: 0 10*3/uL (ref 0.0–1.0)
ABS. NEUTROPHILS: 8.9 10*3/uL — ABNORMAL HIGH (ref 1.8–8.0)
BASOPHILS: 0 % (ref 0–1)
EOSINOPHILS: 3 % (ref 0–7)
HCT: 28 % — ABNORMAL LOW (ref 35.0–47.0)
HGB: 8.5 g/dL — ABNORMAL LOW (ref 11.5–16.0)
LYMPHOCYTES: 16 % (ref 12–49)
MCH: 27.2 PG (ref 26.0–34.0)
MCHC: 30.4 g/dL (ref 30.0–36.5)
MCV: 89.5 FL (ref 80.0–99.0)
MONOCYTES: 0 % — ABNORMAL LOW (ref 5–13)
NEUTROPHILS: 81 % — ABNORMAL HIGH (ref 32–75)
PLATELET: 67 10*3/uL — ABNORMAL LOW (ref 150–400)
RBC: 3.13 M/uL — ABNORMAL LOW (ref 3.80–5.20)
RDW: 19.9 % — ABNORMAL HIGH (ref 11.5–14.5)
WBC: 11 10*3/uL (ref 3.6–11.0)

## 2009-05-10 LAB — PROTHROMBIN TIME + INR
INR: 1.7 — ABNORMAL HIGH (ref 0.9–1.1)
Prothrombin time: 16.7 SECS — ABNORMAL HIGH (ref 9.0–11.0)

## 2009-05-10 LAB — MAGNESIUM: Magnesium: 1.9 MG/DL (ref 1.6–2.4)

## 2009-05-10 MED ADMIN — levothyroxine (SYNTHROID) tablet 50 mcg: ORAL | @ 10:00:00 | NDC 51079044401

## 2009-05-10 MED ADMIN — lactulose (CHRONULAC) solution 20 g: ORAL | @ 02:00:00 | NDC 00121457730

## 2009-05-10 MED ADMIN — morphine injection 1 mg: INTRAVENOUS | @ 13:00:00 | NDC 00409176230

## 2009-05-10 MED ADMIN — vancomycin (VANCOCIN) 0.75 g in 0.9% sodium chloride 250 mL IVPB: INTRAVENOUS | @ 08:00:00 | NDC 63323031461

## 2009-05-10 MED ADMIN — albuterol/ipratropium (DUONEB) neb solution: RESPIRATORY_TRACT | @ 16:00:00 | NDC 00487980101

## 2009-05-10 MED ADMIN — TPN ADULT - PERIPHERAL: INTRAVENOUS | @ 23:00:00 | NDC 00517293025

## 2009-05-10 MED ADMIN — metoprolol tartrate (LOPRESSOR) injection 2.5 mg: INTRAVENOUS | @ 23:00:00 | NDC 00143987310

## 2009-05-10 MED ADMIN — sodium chloride (NS) flush 10 mL: @ 19:00:00 | NDC 87701099893

## 2009-05-10 MED ADMIN — metoprolol tartrate (LOPRESSOR) injection 2.5 mg: INTRAVENOUS | @ 04:00:00 | NDC 00143987310

## 2009-05-10 MED ADMIN — sodium chloride (NS) flush 10 mL: @ 10:00:00 | NDC 87701099893

## 2009-05-10 MED ADMIN — vancomycin (VANCOCIN) 0.75 g in 0.9% sodium chloride 250 mL IVPB: INTRAVENOUS | @ 19:00:00 | NDC 63323031461

## 2009-05-10 MED ADMIN — piperacillin-tazobactam (ZOSYN) 3.375 g in 0.9% sodium chloride (MBP/ADV) 100 mL MBP: INTRAVENOUS | @ 05:00:00 | NDC 00338055318

## 2009-05-10 MED ADMIN — albuterol/ipratropium (DUONEB) neb solution: RESPIRATORY_TRACT | @ 20:00:00 | NDC 00487980101

## 2009-05-10 MED ADMIN — metoprolol tartrate (LOPRESSOR) injection 2.5 mg: INTRAVENOUS | @ 10:00:00 | NDC 00143987310

## 2009-05-10 MED ADMIN — sodium chloride (NS) flush 10 mL: @ 01:00:00 | NDC 87701099893

## 2009-05-10 MED ADMIN — dextrose 5% infusion: INTRAVENOUS | @ 10:00:00 | NDC 00409792209

## 2009-05-10 MED ADMIN — enoxaparin (LOVENOX) injection 70 mg: SUBCUTANEOUS | @ 01:00:00 | NDC 00075062430

## 2009-05-10 MED ADMIN — albuterol/ipratropium (DUONEB) neb solution: RESPIRATORY_TRACT | @ 02:00:00 | NDC 00487980101

## 2009-05-10 MED ADMIN — enoxaparin (LOVENOX) injection 70 mg: SUBCUTANEOUS | @ 13:00:00 | NDC 00075062430

## 2009-05-10 MED ADMIN — piperacillin-tazobactam (ZOSYN) 3.375 g in 0.9% sodium chloride (MBP/ADV) 100 mL MBP: INTRAVENOUS | @ 17:00:00 | NDC 00338055318

## 2009-05-10 MED ADMIN — levofloxacin (LEVAQUIN) 750 mg infusion: INTRAVENOUS | @ 13:00:00 | NDC 50458016601

## 2009-05-10 MED ADMIN — morphine injection 1 mg: INTRAVENOUS | @ 21:00:00 | NDC 00409176230

## 2009-05-10 MED ADMIN — albuterol/ipratropium (DUONEB) neb solution: RESPIRATORY_TRACT | @ 12:00:00 | NDC 00487980101

## 2009-05-10 MED ADMIN — metoprolol tartrate (LOPRESSOR) injection 2.5 mg: INTRAVENOUS | @ 17:00:00 | NDC 00143987310

## 2009-05-10 MED ADMIN — pantoprazole (PROTONIX) injection 40 mg: INTRAVENOUS | @ 13:00:00 | NDC 00409488810

## 2009-05-10 MED ADMIN — piperacillin-tazobactam (ZOSYN) 3.375 g in 0.9% sodium chloride (MBP/ADV) 100 mL MBP: INTRAVENOUS | @ 10:00:00 | NDC 00338055318

## 2009-05-10 MED ADMIN — thiamine (B-1) 100 mg in 0.9% sodium chloride 50 mL IVPB: INTRAVENOUS | @ 13:00:00 | NDC 63323001302

## 2009-05-10 MED ADMIN — lactulose (CHRONULAC) solution 20 g: ORAL | @ 13:00:00 | NDC 00121457730

## 2009-05-10 MED FILL — ZOSYN 3.375 GRAM INTRAVENOUS SOLUTION: 3.375 gram | INTRAVENOUS | Qty: 3.38

## 2009-05-10 MED FILL — SALINE FLUSH INJECTION SYRINGE: INTRAMUSCULAR | Qty: 20

## 2009-05-10 MED FILL — METOPROLOL TARTRATE 5 MG/5 ML IV SOLN: 5 mg/ mL | INTRAVENOUS | Qty: 5

## 2009-05-10 MED FILL — MORPHINE 2 MG/ML INJECTION: 2 mg/mL | INTRAMUSCULAR | Qty: 1

## 2009-05-10 MED FILL — SODIUM CHLORIDE 0.9 % INJECTION: INTRAMUSCULAR | Qty: 10

## 2009-05-10 MED FILL — IPRATROPIUM BROMIDE 0.02 % SOLN FOR INHALATION: 0.02 % | RESPIRATORY_TRACT | Qty: 7.5

## 2009-05-10 MED FILL — LACTULOSE 10 GRAM/15 ML ORAL SOLN: 10 gram/15 mL | ORAL | Qty: 30

## 2009-05-10 MED FILL — LEVOTHYROXINE 25 MCG TAB: 25 mcg | ORAL | Qty: 2

## 2009-05-10 MED FILL — VANCOMYCIN 10 GRAM IV SOLR: 10 gram | INTRAVENOUS | Qty: 0.75

## 2009-05-10 MED FILL — SALINE FLUSH INJECTION SYRINGE: INTRAMUSCULAR | Qty: 30

## 2009-05-10 MED FILL — AMINOSYN II 10 % IV: 10 % | INTRAVENOUS | Qty: 685.1

## 2009-05-10 MED FILL — LEVAQUIN 750 MG/150 ML IN 5 % DEXTROSE INTRAVENOUS PIGGYBACK: 750 mg/150 mL | INTRAVENOUS | Qty: 150

## 2009-05-10 MED FILL — LOVENOX 30 MG/0.3 ML SUB-Q SYRINGE: 30 mg/0.3 mL | SUBCUTANEOUS | Qty: 1

## 2009-05-10 MED FILL — SALINE FLUSH INJECTION SYRINGE: INTRAMUSCULAR | Qty: 10

## 2009-05-10 MED FILL — SODIUM CHLORIDE 0.9 % IV PIGGY BACK: INTRAVENOUS | Qty: 100

## 2009-05-10 MED FILL — THIAMINE 100 MG/ML INJECTION: 100 mg/mL | INTRAMUSCULAR | Qty: 1

## 2009-05-10 NOTE — Progress Notes (Signed)
 Formatting of this note is different from the original.  PULMONARY/Critical Care/SLEEP MEDICINE - [facility]  Name: Breanna Lester   DOB: 09-12-1960   MRN: [id number]   Date: 05/10/2009 9:35 AM   [x]     I have reviewed the flowsheet and previous day?s notes.  []    The patient is unable to give any meaningful history or review of systems because the patient is:  []    Intubated []       []    Sedated    []    Unresponsive      []    The patient is critically ill on      []    Mechanical ventilation []    Pressors   []    BiPAP []           ROS:A comprehensive review of systems was negative except for: Respiratory: positive for dyspnea on exertion  Musculoskeletal: positive for muscle weakness    Vital Signs:    BP 99/40  Pulse 83  Temp 97.7 F (36.5 C)  Resp 34  Ht 5' 2 (1.575 m)  Wt 144 lb 13.5 oz (65.7 kg)  SpO2 96%  PF 40 L/min    O2 Device: Nasal cannula   O2 Flow Rate (L/min): 2 l/min   Temp (24hrs), Avg:98.7 F (37.1 C), Min:97.7 F (36.5 C), Max:99.5 F (37.5 C)      Intake/Output:   Last shift:      In: 375 (375 I.V.)  Out: 135 (135 Urine)    Last 3 shifts: In: 5309.6 (3694.6 I.V.)  Out: 3830 (2150 Urine 1300 Drains)      Intake/Output Summary (Last 24 hours) at 05/10/09 0935  Last data filed at 05/10/09 0900   Gross per 24 hour   Intake   3890 ml   Output   2780 ml   Net   1110 ml     Hemodynamics:   PAP:     Wedge:     CVP:      CO:     CI:     SVR:     PVR:        Ventilator Settings:    Physical Exam:    General: []    Intubated/sedated []    No acute distress []        [x]    Ill appearing []     []          HEENT: []    Icteric [x]    PERRL []        [x]    Anicteric Mucosa:[x]    moist   []    dry []         Resp: []    Wheeze []    Clear to ascultation bilaterally []    Accessory muscle use    [x]    Rales []    Chest tube:  []    Air leak []        [x]    Ronchi []    Crepitus []       []    Coarse bilateral ventilator breath sounds     CV: [x]    Regular []    Tachycardia []        []    Irregular []     Bradycardic []        []    Murmur []    S3 []        []    Edema []    S4 []         GI: [x]    Soft  []    NG Tube Bowel sounds: [x]    present []   absent    []    Firm []    PEG []        []    Distended  []         GU: [x]    Foley []    Hematuria []        [x]    Clear urine []    Edema []         MSK:    []    SCD?s []    Edema []        [x]    No deformities []     []         Skin: [x]    Warm [x]    Dry  []        []    Cool []    Moist []        []    Hot  []    Diaphoretic []        []    Rash  []    Cyanosis []         N-psych: []    Sedated  []    Agitated []        [x]    Alert  Follows commands:   [x]    Yes  []    No []         Devices: []    ET-Tube []    Tracheostomy []    Chest tube: Air leak? []    Y/[]    N    []    [employer] []    PA Catheter []        []    PICC []     []         DATA:   Current facility-administered medications   Medication Dose Route Frequency   ? lactulose (CHRONULAC) solution 20 g  20 g Oral BID   ? aspirin chewable tablet 81 mg  81 mg Oral DAILY   ? levothyroxine (SYNTHROID) injection 25 mcg  25 mcg IntraVENous DAILY   ? TPN ADULT - PERIPHERAL     IntraVENous CONTINUOUS   ? levofloxacin (LEVAQUIN) 750 mg infusion   750 mg IntraVENous Q24H   ? vancomycin (VANCOCIN) 0.75 g in 0.9% sodium chloride  250 mL IVPB  750 mg IntraVENous Q12H   ? 0.9% sodium chloride  infusion    IntraVENous RAD ONCE   ? ioversol (OPTIRAY) 350 mg/mL contrast solution 100 mL  100 mL IntraVENous RAD ONCE   ? sodium chloride  (NS) flush 10 mL  10 mL IntraVENous RAD ONCE   ? dextrose 5% infusion    IntraVENous CONTINUOUS   ? piperacillin-tazobactam (ZOSYN) 3.375 g in 0.9% sodium chloride  (MBP/ADV) 100 mL MBP  3.375 g IntraVENous Q6H   ? enoxaparin  (LOVENOX ) injection 70 mg  70 mg SubCUTAneous Q12H   ? DISCONTD: levothyroxine (SYNTHROID) tablet 50 mcg  50 mcg Oral ACB   ? albumin human 5% (BUMINATE) solution 25 g  25 g IntraVENous Q4H PRN   ? metoprolol  tartrate (LOPRESSOR ) injection 2.5 mg  2.5 mg IntraVENous Q6H   ? DISCONTD: lactulose (CHRONULAC) solution  20 g  20 g Oral BID   ? DISCONTD: aspirin (ASPIRIN) tablet 325 mg  325 mg Oral DAILY   ? DISCONTD: vancomycin (VANCOCIN) 1 g in 0.9% sodium chloride  250 mL IVPB  1,000 mg IntraVENous Q24H   ? morphine  injection 1 mg  1 mg IntraVENous PRN   ? pantoprazole  (PROTONIX ) injection 40 mg  40 mg IntraVENous DAILY   ? albuterol /ipratropium (DUONEB ) neb solution   1 Dose Nebulization Q4HWA RT   ? midazolam (VERSED) injection 2-4 mg  2-4 mg IntraVENous Q1H PRN   ?  sodium chloride  (NS) flush 10 mL  10 mL InterCATHeter Q8H   ? sodium chloride  (NS) flush 10 mL  10 mL InterCATHeter PRN   ? heparin (porcine) pf 300 Units  300 Units InterCATHeter PRN   ? sodium chloride  (NS) flush 20 mL  20 mL InterCATHeter PRN   ? acetaminophen  (TYLENOL ) suppository 650 mg  650 mg Rectal Q6H PRN   ? albuterol  (PROVENTIL  VENTOLIN ) nebulizer solution 2.5 mg  2.5 mg Nebulization Q2H PRN   ? thiamine (B-1) 100 mg in 0.9% sodium chloride  50 mL IVPB  100 mg IntraVENous DAILY   ? nitroglycerin (NITROBID) 2 % ointment 2 Inch  2 Inch Topical Q6H PRN   ? saline peripheral flush 5 mL  5 mL InterCATHeter PRN     Telemetry: [x]    Sinus []    A-flutter []    Paced    []    A-fib []    Multiple PVC?s     Labs:  Recent Labs   Basename 05/10/09 0550 05/09/09 0510 05/08/09 0415   ? WBC 11.0 12.6* 10.3   ? HGB 8.5* 9.0* 8.5*   ? HCT 28.0* 29.9* 28.5*   ? PLT 67* 56* 65*     Recent Labs   Basename 05/10/09 0340 05/09/09 0510 05/08/09 0415   ? NA 149* 153* 160*   ? K 3.5 3.8 3.6   ? CL 118* 124* 126*   ? CO2 22 22 21    ? GLU 114* 120* 95   ? BUN 15 24* 29*   ? CREA 0.8 0.9 1.1   ? CA 8.7 8.5 8.0*   ? MG 1.9 2.4 --   ? PHOS -- -- --   ? ALB 2.2* 2.2* 2.1*   ? TBIL 3.9* 4.1* 3.8*   ? SGOT 41* 52* 54*   ? INR 1.7* -- --     Recent Labs   Basename 05/09/09 1844 05/09/09 1407 05/09/09 0445   ? PH 7.41 7.42 7.39   ? PCO2 37 30* 36   ? PO2 106* 134* 108*   ? HCO3 22 19* 21*   ? 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 Wean O2  2. Cont iv abx  3. PUD/DVT prophylaxis  4. PO as tol  5. Transfuse as needed  6. DNR     [x]    See my orders for details    My assessment/plan was discussed with:  [x]    nursing []    PT/OT    []    respiratory therapy []    Dr.   Kimberlee    family []         []    Total critical care time exclusive of procedures  minutes  Catalina FABIENE Sanders, MD      Electronically signed by Sanders Catalina SAUNDERS, MD at 05/10/2009  9:38 AM EDT

## 2009-05-10 NOTE — Progress Notes (Signed)
 Formatting of this note is different from the original.  Cardiology Progress Note    05/10/2009 10:18 AM    Admit Date: 04/29/2009    Subjective:     Extubated, on nasal O2.  Sleeping but arousable, not following commands for me.    BP 110/45  Pulse 82  Temp 97.7 F (36.5 C)  Resp 32  Ht 5' 2 (1.575 m)  Wt 144 lb 13.5 oz (65.7 kg)  SpO2 95%  PF 40 L/min  Current facility-administered medications   Medication Dose Route Frequency   ? lactulose (CHRONULAC) solution 20 g  20 g Oral BID   ? aspirin chewable tablet 81 mg  81 mg Oral DAILY   ? levothyroxine (SYNTHROID) injection 25 mcg  25 mcg IntraVENous DAILY   ? TPN ADULT - PERIPHERAL     IntraVENous CONTINUOUS   ? levofloxacin (LEVAQUIN) 750 mg infusion   750 mg IntraVENous Q24H   ? vancomycin (VANCOCIN) 0.75 g in 0.9% sodium chloride  250 mL IVPB  750 mg IntraVENous Q12H   ? 0.9% sodium chloride  infusion    IntraVENous RAD ONCE   ? ioversol (OPTIRAY) 350 mg/mL contrast solution 100 mL  100 mL IntraVENous RAD ONCE   ? sodium chloride  (NS) flush 10 mL  10 mL IntraVENous RAD ONCE   ? dextrose 5% infusion    IntraVENous CONTINUOUS   ? piperacillin-tazobactam (ZOSYN) 3.375 g in 0.9% sodium chloride  (MBP/ADV) 100 mL MBP  3.375 g IntraVENous Q6H   ? enoxaparin  (LOVENOX ) injection 70 mg  70 mg SubCUTAneous Q12H   ? DISCONTD: levothyroxine (SYNTHROID) tablet 50 mcg  50 mcg Oral ACB   ? albumin human 5% (BUMINATE) solution 25 g  25 g IntraVENous Q4H PRN   ? metoprolol  tartrate (LOPRESSOR ) injection 2.5 mg  2.5 mg IntraVENous Q6H   ? DISCONTD: lactulose (CHRONULAC) solution 20 g  20 g Oral BID   ? DISCONTD: aspirin (ASPIRIN) tablet 325 mg  325 mg Oral DAILY   ? DISCONTD: vancomycin (VANCOCIN) 1 g in 0.9% sodium chloride  250 mL IVPB  1,000 mg IntraVENous Q24H   ? morphine  injection 1 mg  1 mg IntraVENous PRN   ? pantoprazole  (PROTONIX ) injection 40 mg  40 mg IntraVENous DAILY   ? albuterol /ipratropium (DUONEB ) neb solution   1 Dose Nebulization Q4HWA RT   ? midazolam  (VERSED) injection 2-4 mg  2-4 mg IntraVENous Q1H PRN   ? sodium chloride  (NS) flush 10 mL  10 mL InterCATHeter Q8H   ? sodium chloride  (NS) flush 10 mL  10 mL InterCATHeter PRN   ? heparin (porcine) pf 300 Units  300 Units InterCATHeter PRN   ? sodium chloride  (NS) flush 20 mL  20 mL InterCATHeter PRN   ? acetaminophen  (TYLENOL ) suppository 650 mg  650 mg Rectal Q6H PRN   ? albuterol  (PROVENTIL  VENTOLIN ) nebulizer solution 2.5 mg  2.5 mg Nebulization Q2H PRN   ? thiamine (B-1) 100 mg in 0.9% sodium chloride  50 mL IVPB  100 mg IntraVENous DAILY   ? nitroglycerin (NITROBID) 2 % ointment 2 Inch  2 Inch Topical Q6H PRN   ? saline peripheral flush 5 mL  5 mL InterCATHeter PRN         Objective:     Physical Exam:  Chest--scattered rhonchi, exp wheezing  CV--rrr Nl S1S@ I/VI sem  ABD--normal BS  Ext--unchanged    Data Review:   Labs:  Recent Results (from the past 24 hour(s))   URINALYSIS W/MICROSCOPIC  Collection Time    05/09/09 11:05 AM   Component Value Range   ? Color DARK YELLOW     ? Appearance CLEAR     ? Specific gravity 1.024  1.003 - 1.030 ( )   ? pH 6.0  5.0 - 8.0 ( )   ? Protein TRACE (*) NEGATIVE (MG/DL)   ? Glucose NEGATIVE   NEGATIVE (MG/DL)   ? Ketone NEGATIVE   NEGATIVE (MG/DL)   ? Blood NEGATIVE   NEGATIVE    ? Urobilinogen 0.2  0.2 - 1.0 (EU/DL)   ? Nitrites NEGATIVE   NEGATIVE    ? Leukocyte Esterase SMALL (*) NEGATIVE    ? WBC 5-10  0 - 4 (/HPF)   ? RBC 10-20  0 - 5 (/HPF)   ? Epithelial cells 0-5  0 - 5 (/LPF)   ? Bacteria NEGATIVE   NEGATIVE (/HPF)   ? Hyaline Cast 0-2  0 - 2    BILIRUBIN, CONFIRM    Collection Time    05/09/09 11:05 AM   Component Value Range   ? Ictotest POSITIVE (*) NEGATIVE    BLOOD GAS, ARTERIAL    Collection Time    05/09/09  2:07 PM   Component Value Range   ? pH 7.42  7.35 - 7.45 ( )   ? PCO2 30 (*) 35 - 45 (mmHg)   ? PO2 134 (*) 80 - 100 (mmHg)   ? O2 SAT 99 (*) 92 - 97 (%)   ? BICARBONATE 19 (*) 22 - 26 (mmol/L)   ? BASE DEFICIT 3.9 (*) BE NORMAL RANGE -3 T (mmol/L)   ?  FIO2 40  (%)   ? SITE RR     ? MODE CPAP     ? SPONTANEOUS RATE 33  ( )   ? PEEP/CPAP 5  ( )   ? PRESSURE SUPPORT 5  ( )   ? SAMPLE SOURCE ARTERIAL     ? O2 METHOD VENTILATOR     ? RESPIRATORY COMMENT  CVRB TO DR COOPER @ 1418     BLOOD GAS, ARTERIAL    Collection Time    05/09/09  6:44 PM   Component Value Range   ? pH 7.41  7.35 - 7.45 ( )   ? PCO2 37  35 - 45 (mmHg)   ? PO2 106 (*) 80 - 100 (mmHg)   ? O2 SAT 98 (*) 92 - 97 (%)   ? BICARBONATE 22  22 - 26 (mmol/L)   ? BASE DEFICIT 1.8 (*) BE NORMAL RANGE -3 T (mmol/L)   ? FIO2 40  (%)   ? SITE RR     ? MODE CPAP     ? SPONTANEOUS RATE 32  ( )   ? PEEP/CPAP 5  ( )   ? PRESSURE SUPPORT 5  ( )   ? SAMPLE SOURCE ARTERIAL     ? O2 METHOD VENTILATOR     METABOLIC PANEL, COMPREHENSIVE    Collection Time    05/10/09  3:40 AM   Component Value Range   ? Sodium 149 (*) 136 - 145 (MMOL/L)   ? Potassium 3.5  3.5 - 5.1 (MMOL/L)   ? Chloride 118 (*) 97 - 108 (MMOL/L)   ? CO2 22  21 - 32 (MMOL/L)   ? Anion gap 9  5 - 15 (mmol/L)   ? Glucose 114 (*) 65 - 100 (MG/DL)   ? BUN 15  6 - 20 (  MG/DL)   ? Creatinine 0.8  0.6 - 1.3 (MG/DL)   ? BUN/Creatinine ratio 19  12 - 20 ( )   ? GFR est AA >60  >60 (ml/min/1.34m2)   ? GFR est non-AA >60  >60 (ml/min/1.80m2)   ? Calcium 8.7  8.5 - 10.1 (MG/DL)   ? Bilirubin, total 3.9 (*) 0.2 - 1.0 (MG/DL)   ? ALT 71  12 - 78 (U/L)   ? AST 41 (*) 15 - 37 (U/L)   ? Alk. phosphatase 112  50 - 136 (U/L)   ? Protein, total 5.6 (*) 6.4 - 8.2 (g/dL)   ? Albumin 2.2 (*) 3.5 - 5.0 (g/dL)   ? Globulin 3.4  2.0 - 4.0 (g/dL)   ? A-G Ratio 0.6 (*) 1.1 - 2.2 ( )   MAGNESIUM     Collection Time    05/10/09  3:40 AM   Component Value Range   ? Magnesium  1.9  1.6 - 2.4 (MG/DL)   PROTHROMBIN TIME    Collection Time    05/10/09  3:40 AM   Component Value Range   ? INR 1.7 (*) 0.9 - 1.1 ( )   ? Prothrombin Time-PT 16.7 (*) 9.0 - 11.0 (SECS)   CBC WITH AUTOMATED DIFF    Collection Time    05/10/09  5:50 AM   Component Value Range   ? WBC 11.0  3.6 - 11.0 (K/uL)   ? RBC 3.13 (*)  3.80 - 5.20 (M/uL)   ? HGB 8.5 (*) 11.5 - 16.0 (g/dL)   ? HCT 28.0 (*) 35.0 - 47.0 (%)   ? MCV 89.5  80.0 - 99.0 (FL)   ? MCH 27.2  26.0 - 34.0 (PG)   ? MCHC 30.4  30.0 - 36.5 (g/dL)   ? RDW 19.9 (*) 11.5 - 14.5 (%)   ? PLATELET 67 (*) 150 - 400 (K/uL)   ? NEUTROPHILS 81 (*) 32 - 75 (%)   ? LYMPHOCYTES 16  12 - 49 (%)   ? MONOCYTES 0 (*) 5 - 13 (%)   ? EOSINOPHILS 3  0 - 7 (%)   ? BASOPHILS 0  0 - 1 (%)   ? ABSOLUTE NEUTS 8.9 (*) 1.8 - 8.0 (K/UL)   ? ABSOLUTE LYMPHS 1.8  0.8 - 3.5 (K/UL)   ? ABSOLUTE MONOS 0.0  0.0 - 1.0 (K/UL)   ? ABSOLUTE EOSINS 0.3  0.0 - 0.4 (K/UL)   ? ABSOLUTE BASOS 0.0  0.0 - 0.1 (K/UL)   ? RBC COMMENTS 1+ ANISOCYTOSIS     ? 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    Norleen MICAEL Gelineau, MD      Electronically signed by Gelineau Norleen ORN, MD at 05/10/2009 10:21 AM EDT

## 2009-05-10 NOTE — Progress Notes (Signed)
 Formatting of this note is different from the original.  Neurology Progress Note    Patient ID:  Breanna Lester  [id number]  48 y.o.  August 31, 1960    Subjective:     Patient has complaints lethargy..    Current facility-administered medications   Medication Dose Route Frequency   ? lactulose (CHRONULAC) solution 20 g  20 g Oral BID   ? aspirin chewable tablet 81 mg  81 mg Oral DAILY   ? levothyroxine (SYNTHROID) injection 25 mcg  25 mcg IntraVENous DAILY   ? TPN ADULT - PERIPHERAL     IntraVENous CONTINUOUS   ? levofloxacin (LEVAQUIN) 750 mg infusion   750 mg IntraVENous Q24H   ? vancomycin (VANCOCIN) 0.75 g in 0.9% sodium chloride  250 mL IVPB  750 mg IntraVENous Q12H   ? 0.9% sodium chloride  infusion    IntraVENous RAD ONCE   ? ioversol (OPTIRAY) 350 mg/mL contrast solution 100 mL  100 mL IntraVENous RAD ONCE   ? sodium chloride  (NS) flush 10 mL  10 mL IntraVENous RAD ONCE   ? dextrose 5% infusion    IntraVENous CONTINUOUS   ? piperacillin-tazobactam (ZOSYN) 3.375 g in 0.9% sodium chloride  (MBP/ADV) 100 mL MBP  3.375 g IntraVENous Q6H   ? enoxaparin  (LOVENOX ) injection 70 mg  70 mg SubCUTAneous Q12H   ? DISCONTD: levothyroxine (SYNTHROID) tablet 50 mcg  50 mcg Oral ACB   ? albumin human 5% (BUMINATE) solution 25 g  25 g IntraVENous Q4H PRN   ? metoprolol  tartrate (LOPRESSOR ) injection 2.5 mg  2.5 mg IntraVENous Q6H   ? DISCONTD: lactulose (CHRONULAC) solution 20 g  20 g Oral BID   ? DISCONTD: aspirin (ASPIRIN) tablet 325 mg  325 mg Oral DAILY   ? DISCONTD: vancomycin (VANCOCIN) 1 g in 0.9% sodium chloride  250 mL IVPB  1,000 mg IntraVENous Q24H   ? morphine  injection 1 mg  1 mg IntraVENous PRN   ? pantoprazole  (PROTONIX ) injection 40 mg  40 mg IntraVENous DAILY   ? albuterol /ipratropium (DUONEB ) neb solution   1 Dose Nebulization Q4HWA RT   ? midazolam (VERSED) injection 2-4 mg  2-4 mg IntraVENous Q1H PRN   ? sodium chloride  (NS) flush 10 mL  10 mL InterCATHeter Q8H   ? sodium chloride  (NS) flush 10 mL  10 mL InterCATHeter  PRN   ? heparin (porcine) pf 300 Units  300 Units InterCATHeter PRN   ? sodium chloride  (NS) flush 20 mL  20 mL InterCATHeter PRN   ? acetaminophen  (TYLENOL ) suppository 650 mg  650 mg Rectal Q6H PRN   ? albuterol  (PROVENTIL  VENTOLIN ) nebulizer solution 2.5 mg  2.5 mg Nebulization Q2H PRN   ? thiamine (B-1) 100 mg in 0.9% sodium chloride  50 mL IVPB  100 mg IntraVENous DAILY   ? nitroglycerin (NITROBID) 2 % ointment 2 Inch  2 Inch Topical Q6H PRN   ? saline peripheral flush 5 mL  5 mL InterCATHeter PRN         Review of Systems:    A comprehensive review of systems was negative except for: Neurological: positive for speech problems and weakness    Objective:     Patient Vitals in the past 8 hrs:   BP Temp Pulse Resp SpO2   05/10/09 1000 110/45 mmHg - 82  32  95 %   05/10/09 0900 99/40 mmHg - 83  34  96 %   05/10/09 0800 105/51 mmHg 97.7 F (36.5 C) 79  37  97 %  05/10/09 0700 107/56 mmHg - 78  18  97 %   05/10/09 0600 92/40 mmHg - 77  22  97 %   05/10/09 0538 103/44 mmHg - 81  - -   05/10/09 0500 92/41 mmHg - 76  27  97 %   05/10/09 0400 108/43 mmHg - 73  34  97 %   05/10/09 0300 107/44 mmHg - 73  33  99 %     In: 525 (525 I.V.)  Out: 220 (220 Urine)    In: 5309.6 (3694.6 I.V.)  Out: 3830 (2150 Urine 1300 Drains)      Lab Review Recent Results (from the past 24 hour(s))   URINALYSIS W/MICROSCOPIC    Collection Time    05/09/09 11:05 AM   Component Value Range   ? Color DARK YELLOW     ? Appearance CLEAR     ? Specific gravity 1.024  1.003 - 1.030 ( )   ? pH 6.0  5.0 - 8.0 ( )   ? Protein TRACE (*) NEGATIVE (MG/DL)   ? Glucose NEGATIVE   NEGATIVE (MG/DL)   ? Ketone NEGATIVE   NEGATIVE (MG/DL)   ? Blood NEGATIVE   NEGATIVE    ? Urobilinogen 0.2  0.2 - 1.0 (EU/DL)   ? Nitrites NEGATIVE   NEGATIVE    ? Leukocyte Esterase SMALL (*) NEGATIVE    ? WBC 5-10  0 - 4 (/HPF)   ? RBC 10-20  0 - 5 (/HPF)   ? Epithelial cells 0-5  0 - 5 (/LPF)   ? Bacteria NEGATIVE   NEGATIVE (/HPF)   ? Hyaline Cast 0-2  0 - 2    BILIRUBIN,  CONFIRM    Collection Time    05/09/09 11:05 AM   Component Value Range   ? Ictotest POSITIVE (*) NEGATIVE    BLOOD GAS, ARTERIAL    Collection Time    05/09/09  2:07 PM   Component Value Range   ? pH 7.42  7.35 - 7.45 ( )   ? PCO2 30 (*) 35 - 45 (mmHg)   ? PO2 134 (*) 80 - 100 (mmHg)   ? O2 SAT 99 (*) 92 - 97 (%)   ? BICARBONATE 19 (*) 22 - 26 (mmol/L)   ? BASE DEFICIT 3.9 (*) BE NORMAL RANGE -3 T (mmol/L)   ? FIO2 40  (%)   ? SITE RR     ? MODE CPAP     ? SPONTANEOUS RATE 33  ( )   ? PEEP/CPAP 5  ( )   ? PRESSURE SUPPORT 5  ( )   ? SAMPLE SOURCE ARTERIAL     ? O2 METHOD VENTILATOR     ? RESPIRATORY COMMENT  CVRB TO DR COOPER @ 1418     BLOOD GAS, ARTERIAL    Collection Time    05/09/09  6:44 PM   Component Value Range   ? pH 7.41  7.35 - 7.45 ( )   ? PCO2 37  35 - 45 (mmHg)   ? PO2 106 (*) 80 - 100 (mmHg)   ? O2 SAT 98 (*) 92 - 97 (%)   ? BICARBONATE 22  22 - 26 (mmol/L)   ? BASE DEFICIT 1.8 (*) BE NORMAL RANGE -3 T (mmol/L)   ? FIO2 40  (%)   ? SITE RR     ? MODE CPAP     ? SPONTANEOUS RATE 32  ( )   ?  PEEP/CPAP 5  ( )   ? PRESSURE SUPPORT 5  ( )   ? SAMPLE SOURCE ARTERIAL     ? O2 METHOD VENTILATOR     METABOLIC PANEL, COMPREHENSIVE    Collection Time    05/10/09  3:40 AM   Component Value Range   ? Sodium 149 (*) 136 - 145 (MMOL/L)   ? Potassium 3.5  3.5 - 5.1 (MMOL/L)   ? Chloride 118 (*) 97 - 108 (MMOL/L)   ? CO2 22  21 - 32 (MMOL/L)   ? Anion gap 9  5 - 15 (mmol/L)   ? Glucose 114 (*) 65 - 100 (MG/DL)   ? BUN 15  6 - 20 (MG/DL)   ? Creatinine 0.8  0.6 - 1.3 (MG/DL)   ? BUN/Creatinine ratio 19  12 - 20 ( )   ? GFR est AA >60  >60 (ml/min/1.75m2)   ? GFR est non-AA >60  >60 (ml/min/1.22m2)   ? Calcium 8.7  8.5 - 10.1 (MG/DL)   ? Bilirubin, total 3.9 (*) 0.2 - 1.0 (MG/DL)   ? ALT 71  12 - 78 (U/L)   ? AST 41 (*) 15 - 37 (U/L)   ? Alk. phosphatase 112  50 - 136 (U/L)   ? Protein, total 5.6 (*) 6.4 - 8.2 (g/dL)   ? Albumin 2.2 (*) 3.5 - 5.0 (g/dL)   ? Globulin 3.4  2.0 - 4.0 (g/dL)   ? A-G Ratio 0.6 (*) 1.1 - 2.2 ( )    MAGNESIUM     Collection Time    05/10/09  3:40 AM   Component Value Range   ? Magnesium  1.9  1.6 - 2.4 (MG/DL)   PROTHROMBIN TIME    Collection Time    05/10/09  3:40 AM   Component Value Range   ? INR 1.7 (*) 0.9 - 1.1 ( )   ? Prothrombin Time-PT 16.7 (*) 9.0 - 11.0 (SECS)   CBC WITH AUTOMATED DIFF    Collection Time    05/10/09  5:50 AM   Component Value Range   ? WBC 11.0  3.6 - 11.0 (K/uL)   ? RBC 3.13 (*) 3.80 - 5.20 (M/uL)   ? HGB 8.5 (*) 11.5 - 16.0 (g/dL)   ? HCT 28.0 (*) 35.0 - 47.0 (%)   ? MCV 89.5  80.0 - 99.0 (FL)   ? MCH 27.2  26.0 - 34.0 (PG)   ? MCHC 30.4  30.0 - 36.5 (g/dL)   ? RDW 19.9 (*) 11.5 - 14.5 (%)   ? PLATELET 67 (*) 150 - 400 (K/uL)   ? NEUTROPHILS 81 (*) 32 - 75 (%)   ? LYMPHOCYTES 16  12 - 49 (%)   ? MONOCYTES 0 (*) 5 - 13 (%)   ? EOSINOPHILS 3  0 - 7 (%)   ? BASOPHILS 0  0 - 1 (%)   ? ABSOLUTE NEUTS 8.9 (*) 1.8 - 8.0 (K/UL)   ? ABSOLUTE LYMPHS 1.8  0.8 - 3.5 (K/UL)   ? ABSOLUTE MONOS 0.0  0.0 - 1.0 (K/UL)   ? ABSOLUTE EOSINS 0.3  0.0 - 0.4 (K/UL)   ? ABSOLUTE BASOS 0.0  0.0 - 0.1 (K/UL)   ? RBC COMMENTS 1+ ANISOCYTOSIS     ? DF MANUAL       Additional comments:I reviewed the patient's other test results. labs    NEUROLOGICAL EXAM:    Appearance:  The patient is well developed, well nourished, lethargic  Mental Status: Oriented to person. Mood and affect depressed   Cranial Nerves:   Decreased visual fields bilaterally. Fundi are not seen, PERLA, EOM's full, no nystagmus, no ptosis. Facial sensation is normal. Corneal reflexes are intact. Facial movement is symmetric. Hearing is normal bilaterally. Palate is midline with normal sternocleidomastoid and trapezius muscles are normal. Tongue is midline.   Motor:  4/5 strength in upper and lower proximal and distal muscles. Normal bulk and tone. No fasciculations.   Reflexes:   Deep tendon reflexes 1+/4 and symmetrical.   Sensory:   Normal to touch, pinprick    Gait:  Not tested.   Tremor:   No tremor noted.   Cerebellar:  No cerebellar signs  present.   Neurovascular:  Normal heart sounds and regular rhythm, peripheral pulses intact, and no carotid bruits.     Assessment:     Patient Active Hospital Problem List:  Nausea with vomiting (05/01/2009)    Hypokalemia (05/01/2009)    Hepatic encephalopathy (05/01/2009)    Gait abnormality (05/01/2009)    Alcoholic cirrhosis of liver (05/01/2009)    Hypomagnesemia (05/01/2009)    Plan:   Encephalopathy better, extubated but still slow  Continue Treatment and will followagain on wed    Signed:  Thomas A. Claudene, MD  05/10/2009  10:59 AM    Electronically signed by Claudene Debby LABOR, MD at 05/10/2009 11:03 AM EDT

## 2009-05-10 NOTE — Progress Notes (Signed)
 Formatting of this note is different from the original.  Dr. Percell with Radiology called and discussed the PICC line tip with ICU nurse Heron at 1010 hours on   05/10/2009.  Heron Sprinkle, RN called PICC team Sherrye, RN) and updated them on the status of pt PICC.  Stopped all IV fluids.    Electronically signed by Sprinkle Heron PARAS, RN at 05/10/2009 11:02 AM EDT

## 2009-05-10 NOTE — Progress Notes (Signed)
 Formatting of this note might be different from the original.  Angio called and states, pt PEG tube will not be placed today due to having to close the unit down for a couple of hours.  PEG tube will be placed tomorrow.    Electronically signed by Marlowe Heron PARAS, RN at 05/10/2009  3:30 PM EDT

## 2009-05-10 NOTE — Progress Notes (Signed)
 Formatting of this note is different from the original.       Hospitalist Progress Note         NAME: Breanna Lester        DOB:  April 27, 1961        MRN:  [id number]      Assessment & Plan   Sepsis syndrome :underlying pneumonia, fever, leukocytosis  Broad spectrum antibiotics.  No evidence of UTI or ascites(SBP), has an umbilical hernia that is not     reducible but overall adbomen is soft, has RUQ pain, CT gallbladder sludge and thickenned wall    Acute respiratory failure:got extubated still fighting PNA    Altered mental status : multifactorial Sepsis, hypernatremia, ARF, hyperamonemia     Hypernatremia: improving    Pneumonia: c/w levaquin , zosyn and Vancomycin, will get advice from ID.    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     Review of Systems:  Fever/chills y   Cough N   Sputum    N   SOB/DOE  N   Chest Pain N   Abdominal Pain N   Diarrhea N   Constipation N   Nausea/Vomiting N   Dysuria N   Tolerating PT N   Tolerating Diet N   Could not obtain due to AMS vs intubation xx       Objective:   Patient extubated yesterday, critically ill, sedation was D/C more awake today does not follow commands, NGT D/Ced patient might need PEG for  feeding    VITALS:   Last 24hrs VS reviewed since prior progress note. Most recent are:  Visit Vitals   Item Reading   ? BP 107/56   ? Pulse 78   ? Temp 99.5 F (37.5 C)   ? Resp 18   ? Ht 5' 2 (1.575 m)   ? Wt 144 lb 13.5 oz (65.7 kg)   ? SpO2 97%   ? PF 40L/min     Intake/Output Summary (Last 24 hours) at 05/10/09 0747  Last data filed at 05/10/09 0700   Gross per 24 hour   Intake   3920 ml   Output   2880 ml   Net   1040 ml       Telemetry Reviewed:     PHYSICAL EXAM:  General: Extubated, awake , AMS  HEENT: Pupils unequal, R 3mm, L 2mm  Lungs:  Bilateral rales, ronchi and mild wheezing.  Heart:  Regular rhythm, tachycardic, SEM  Abdomen: Soft, Non distended, hypoactive BS, no rebound, umbilical hernia non reducible,  Extremities: No edema, pulses present, brace in left arm  Neurologic: GCS [id number], no gross deficit  Psych: AMS    Lab  Data Reviewed: (see below)    Medications Reviewed: (see below)    PMH/SH reviewed - no change compared to H&P  ______________________________________________________________________  Total time spent with patient: 45 minutes     Critical Care Provided     Minutes non procedure based    Care Plan discussed with:  Patient    Family    RN x   Care Manager    Consultant/Specialist      >50% of visit spent in counseling and coordination of care       Prophylaxis:  GI PPI   DVT lovenox      Disposition:   Home with Family    HH/PT/OT/RN    SNF/LTC    [facility]      ______________________________________________________________________  Attending Physician: Eric DOROTHA Deutscher, MD   _____________________________________________________________________________________________________  Procedures: see electronic medical records for all procedures/Xrays and details  which were not copied into this note but were reviewed prior to creation of Plan.      LABS:  Recent Labs   Basename 05/10/09 0550 05/09/09 0510   ? WBC 11.0 12.6*   ? HGB 8.5* 9.0*   ? HCT 28.0* 29.9*   ? PLT 67* 56*     Recent Labs    Basename 05/10/09 0340 05/09/09 0510 05/08/09 0415   ? NA 149* 153* 160*   ? K 3.5 3.8 3.6   ? CL 118* 124* 126*   ? CO2 22 22 21    ? BUN 15 24* 29*   ? CREA 0.8 0.9 1.1   ? GLU 114* 120* 95   ? CA 8.7 8.5 8.0*   ? MG 1.9 2.4 --   ? PHOS -- -- --   ? URICA -- -- --     Recent Labs   Basename 05/10/09 0340 05/09/09 0510 05/08/09 0415   ? SGOT 41* 52* 54*   ? GPT 71 82* 83*   ? AP 112 110 105   ? TBIL 3.9* 4.1* 3.8*   ? TP 5.6* 5.7* 5.6*   ? ALB 2.2* 2.2* 2.1*   ? GLOB 3.4 3.5 3.5   ? GGT -- -- --   ? AML -- -- --   ? LPSE -- -- --     Recent Labs   Basename 05/10/09 0340   ? INR 1.7*   ? PTP 16.7*   ? APTT --         No results found for this basename: FE:2,TIBC:2,PSAT:2,FERR:2, in the last 72 hours   Recent Labs   Basename 05/09/09 1844 05/09/09 1407   ? PH 7.41 7.42   ? PCO2 37 30*   ? PO2 106* 134*     Recent Labs   Basename 05/09/09 0510   ? CPK 229*   ? CKMB --     Lab Results   Component Value Date/Time    POC GLUCOSE 114 05/01/2009  4:40 PM    POC GLUCOSE 162 05/01/2009 12:02 PM    POC GLUCOSE 115 05/01/2009  8:39 AM     Lab Results   Component Value Date/Time    Color DARK YELLOW 05/09/2009 11:05 AM    Appearance CLEAR 05/09/2009 11:05 AM    Specific gravity 1.010 10/29/2008 11:00 AM    Specific gravity 1.024 05/09/2009 11:05 AM    pH 6.0 05/09/2009 11:05 AM    Protein TRACE 05/09/2009 11:05 AM    Glucose NEGATIVE  05/09/2009 11:05 AM    Ketone NEGATIVE  05/09/2009 11:05  AM    Bilirubin NEGATIVE  04/30/2009 12:14 AM    Urobilinogen 0.2 05/09/2009 11:05 AM    Nitrites NEGATIVE  05/09/2009 11:05 AM    Leukocyte Esterase SMALL 05/09/2009 11:05 AM    Epithelial cells 0-5 05/09/2009 11:05 AM    Bacteria NEGATIVE  05/09/2009 11:05 AM    WBC 5-10 05/09/2009 11:05 AM    RBC 10-20 05/09/2009 11:05 AM     MEDICATIONS:  Current facility-administered medications   Medication Dose Route Frequency   ? levofloxacin (LEVAQUIN) 750 mg infusion   750 mg IntraVENous Q24H   ? barium sulfate (READICAT) 2 % oral suspension 900 mL  900 mL Oral RAD ONCE   ?  vancomycin (VANCOCIN) 0.75 g in 0.9% sodium chloride  250 mL IVPB  750 mg IntraVENous Q12H   ? 0.9% sodium chloride  infusion    IntraVENous RAD ONCE   ? ioversol (OPTIRAY) 350 mg/mL contrast solution 100 mL  100 mL IntraVENous RAD ONCE   ? sodium chloride  (NS) flush 10 mL  10 mL IntraVENous RAD ONCE   ? dextrose 5% infusion    IntraVENous CONTINUOUS   ? piperacillin-tazobactam (ZOSYN) 3.375 g in 0.9% sodium chloride  (MBP/ADV) 100 mL MBP  3.375 g IntraVENous Q6H   ? enoxaparin  (LOVENOX ) injection 70 mg  70 mg SubCUTAneous Q12H   ? levothyroxine (SYNTHROID) tablet 50 mcg  50 mcg Oral ACB   ? lactulose (CHRONULAC) solution 20 g  20 g Oral BID   ? aspirin (ASPIRIN) tablet 325 mg  325 mg Oral DAILY   ? albumin human 5% (BUMINATE) solution 25 g  25 g IntraVENous Q4H PRN   ? metoprolol  tartrate (LOPRESSOR ) injection 2.5 mg  2.5 mg IntraVENous Q6H   ? DISCONTD: vancomycin (VANCOCIN) 1 g in 0.9% sodium chloride  250 mL IVPB  1,000 mg IntraVENous Q24H   ? morphine  injection 1 mg  1 mg IntraVENous PRN   ? pantoprazole  (PROTONIX ) injection 40 mg  40 mg IntraVENous DAILY   ? albuterol /ipratropium (DUONEB ) neb solution   1 Dose Nebulization Q4HWA RT   ? midazolam (VERSED) injection 2-4 mg  2-4 mg IntraVENous Q1H PRN   ? sodium chloride  (NS) flush 10 mL  10 mL InterCATHeter Q8H   ? sodium chloride  (NS) flush 10 mL  10 mL InterCATHeter PRN   ? heparin (porcine) pf 300 Units  300 Units InterCATHeter PRN   ? sodium chloride  (NS) flush 20 mL  20 mL InterCATHeter PRN   ? acetaminophen  (TYLENOL ) suppository 650 mg  650 mg Rectal Q6H PRN   ? albuterol  (PROVENTIL  VENTOLIN ) nebulizer solution 2.5 mg  2.5 mg Nebulization Q2H PRN   ? thiamine (B-1) 100 mg in 0.9% sodium chloride  50 mL IVPB  100 mg IntraVENous DAILY   ? nitroglycerin (NITROBID) 2 % ointment 2 Inch  2 Inch Topical Q6H PRN   ? saline peripheral flush 5 mL  5 mL InterCATHeter PRN       Electronically signed by Andris Eric PARAS, MD at 05/10/2009  8:07 AM EDT

## 2009-05-10 NOTE — Progress Notes (Signed)
 Formatting of this note is different from the original.  Nutrition F/U:  Thank you for TF order. Will F/U Tuesday to check toleration. TF not started yet; NGT pulled, pt may need PEG for nutrition.    Recommendations:  1.Now that Propofol and d5 are d/c'd, recommended goal rate of TF is 40 ml/hr.  2.Strict aspiration precautions   3.HOB >30 degrees  Madison Sar, PhD, RD, CNSD  [phone]  Electronically signed by Sar Madison BRAVO, PhD, RD, CNSC at 05/10/2009  8:57 AM EDT

## 2009-05-10 NOTE — Progress Notes (Signed)
 Formatting of this note is different from the original.  Problem: Dysphagia (Adult)  Goal: *Acute Goals and Plan of Care (Insert Text)  Speech Therapy Goals  Initiated 05/10/09  1. Patient will participate in re-evaluation of swallow within 7 days  2. Patient will tolerate least restrictive diet without signs or symptoms of aspiration within 7 days  3. Patient will participate in MBS as needed/appropriate within 7 days  SPEECH LANGUAGE PATHOLOGY BEDSIDE SWALLOW EVALUATION    NAME : Breanna Lester AGE: 48 y.o.  GENDER: female  PRIMARY DIAGNOSIS AND MEDICAL HISTORY: AMS, hypokalemia, lft upper extremity pain  AMS, hypokalemia, lft upper extremity pain  AMS, hypokalemia, lft upper extremity pain  <principal problem not specified>    Past Medical History   Diagnosis Date   ? Liver disease 2009       liver failure   ? Depression     ? Anxiety       Past Surgical History   Procedure Date   ? Hx tonsillectomy 1972   ? Hx adenoidectomy 1972   ? Hx cesarean section 1986 and 1993   ? Hx hysterectomy 2008   ? Hx dilation and curettage 1999     Problem List Never Reviewed       Class     Nausea with vomiting [787.01]         Hypokalemia [276.8A]         Hepatic encephalopathy [572.2]         Gait abnormality [781.2N]         Alcoholic cirrhosis of liver [571.2]         Hypomagnesemia [275.2B]           Prior Level of Function/Home Situation:  Home Environment: Private residence  Living Alone: No  Support Systems: Spouse   Patient Expects to be Discharged to:: Private residence    Diet prior to admission: Unknown. Suspect regular    Current Diet: NPO      Precautions: aspiration    SUBJECTIVE:   Patient s hook head yes to participate in oral care/PO trials. NAD noted. No other verbalizations.    OBJECTIVE:   Cognitive and Communication Status:  Level of Consciousness: Confused;Drowsy  Orientation Level: Disoriented X4  Cognition: Follows commands  Oral Assessment:  Oral Assessment  Labial: No impairment  Dentition:  Extractions  Oral Hygiene: Dry oral mucosa with dried secretions on tongue and palate ; oral care completed with green swabs to clear oral cavity of dried secretions.  Lingual: No impairment  Velum: Flaccid  Gag Reflex: Absent (tactile stimulation of faucial pillars revealed absent gag)    P.O. Trials:  Patient Position: Upright in bed    The patient was given the following:  Consistency Presented: Ice chips  How Presented: SLP-fed/presented;Spoon    ORAL PHASE:  Bolus Acceptance: No impairment  Bolus Formation/Control: Impaired . No mastication of ice with verbal cues.  Propulsion: Absent  Type of Impairment: Incomplete;Mastication;Spillage  PHARYNGEAL PHASE:  Initiation of Swallow: Delayed  (over 10 seconds)  Laryngeal Elevation: Decreased  Aspiration Signs/Symptoms: Weak cough  after each ice chip bolus. Wet cough. Patient unable to clear with max verbal cues.  Vocal Quality: Aphonic  Pain:  Pain Screen  Pain Scale: Visual  Pain Intensity: 0  Faces (Wong-Baker) Scale: Hurts even more  Pain Onset: Movement  Pain Location: Arm  Pain Orientation: Left  Pain Intervention(s): Repositioned   After treatment:  [X]    Patient left in no apparent distress  [  X]   Call bell left within reach  [ ]    Caregiver present  [ ]    Patient left up in chair, nursing staff notified    ASSESSMENT:   Based on the objective data described above, the patient presents with moderate-severe oropharyngeal dysphagia characterized by poor oral manipulation, delayed swallow initiation and reduced hyolaryngeal elevation. Patient with overt s/s of aspiration x2 with both ice chip boluses. No further PO trials attempted. Mental status remains reduced which is also affecting ability to swallow safely. Not yet appropriate for PO; recommend NPO.    Patient will benefit from skilled intervention to address the above impairments.    Patient?s rehabilitation potential is considered to be Fair    Factors which may influence rehabilitation potential  include:  [ ]   None noted  [ ]    Mental ability/status  [ ]    Medical condition  [ ]    Home/family situation and support systems  [ ]    Safety awareness  [ ]    Pain tolerance/management  [ ]   Other:     PLAN OF CARE:   Recommendations and Planned Interventions:  [X]    NPO                                           [X]     Modified Barium Swallo w ??    Recommendations and planned interventions including recommended diet changes were discussed with:  [X]   Patient   [ ]    Family      [X]    Nursing  [ ]    Provider  [ ]   Posted safety precautions in patient?s room    Frequency/Duration:  Patient will be followed by speech-language pathology 4 times a week  to address goals.    Discharge Recommendations:  [X]   To be determined                      [ ]     Day rehabilitation program  [X]    Home without services             [ ]     Outpatient therapy  [ ]    Home with Home Health services         [ ]     Nursing facility for rehab  [ ]    Inpatient Rehab Hospital            [ ]     Nursing facility for long term care  [ ]    Other:    Communication/Collaboration:  [ ]   Patient/family have participated as able in goal setting and plan of care.  [ ]    Patient/family agree to work toward stated goals and plan of care.  [ ]   Patient understands intent and goals of therapy, but is neutral about his/her participation.  [X]    Patient is unable to participate in goal setting and plan of care.    The patient?s plan of care was discussed with:  Registered Nurse    Thank you for this referral.    Eleanor DELENA Dunker, SLP    Time Calculation: 15 mins          Electronically signed by Dunker Eleanor DELENA, SLP at 05/10/2009  2:36 PM EDT

## 2009-05-10 NOTE — Progress Notes (Signed)
 Formatting of this note might be different from the original.  Chest xray reading this am reported as PICC line inserted on 9/14 @ 40cms in, 0 out, as now located in the right atrium per Dr. Percell, Radiologist. Pulled PICC line back 4 cms as suggested by Radiologist. Will repeat chest xray stat for tip placement.  Jori Hutchinson, RN, CRNI, PICC nurse  Electronically signed by Hutchinson Jori RAMAN at 05/10/2009 11:27 AM EDT

## 2009-05-10 NOTE — Progress Notes (Signed)
Chest xray reading this am reported as PICC line inserted on 9/14 @ 40cms in, 0 out, as now located in the right atrium per Dr. Gwyndolyn Saxon, Radiologist. Pulled PICC line back 4 cms as suggested by Radiologist. Will repeat chest xray stat for tip placement.  Sabino Gasser, RN, CRNI, PICC nurse

## 2009-05-10 NOTE — Progress Notes (Signed)
Problem: Dysphagia (Adult)  Goal: *Acute Goals and Plan of Care (Insert Text)  Speech Therapy Goals  Initiated 05/10/09  1. Patient will participate in re-evaluation of swallow within 7 days  2. Patient will tolerate least restrictive diet without signs or symptoms of aspiration within 7 days  3. Patient will participate in MBS as needed/appropriate within 7 days  SPEECH LANGUAGE PATHOLOGY BEDSIDE SWALLOW EVALUATION    NAME : Breanna Lester AGE: 48 y.o.  GENDER: female  PRIMARY DIAGNOSIS AND MEDICAL HISTORY: AMS, hypokalemia, lft upper extremity pain  AMS, hypokalemia, lft upper extremity pain  AMS, hypokalemia, lft upper extremity pain  <principal problem not specified>      Past Medical History   Diagnosis Date   ??? Liver disease 2009       liver failure   ??? Depression     ??? Anxiety       Past Surgical History   Procedure Date   ??? Hx tonsillectomy 1972   ??? Hx adenoidectomy 1972   ??? Hx cesarean section 1986 and 1993   ??? Hx hysterectomy 2008   ??? Hx dilation and curettage 1999     Problem List Never Reviewed       Class     Nausea with vomiting [787.01]             Hypokalemia [276.8A]             Hepatic encephalopathy [572.2]             Gait abnormality [781.2N]             Alcoholic cirrhosis of liver [571.2]             Hypomagnesemia [275.2B]                     Prior Level of Function/Home Situation:  Home Environment: Private residence  Living Alone: No  Support Systems: Spouse   Patient Expects to be Discharged to:: Private residence    Diet prior to admission: Unknown. Suspect regular    Current Diet: NPO      Precautions: aspiration     SUBJECTIVE:   Patient s hook head "yes" to participate in oral care/PO trials. NAD noted. No other verbalizations.      OBJECTIVE:   Cognitive and Communication Status:  Level of Consciousness: Confused;Drowsy  Orientation Level: Disoriented X4  Cognition: Follows commands  Oral Assessment:  Oral Assessment  Labial: No impairment  Dentition: Extractions   Oral Hygiene: Dry oral mucosa with dried secretions on tongue and palate ; oral care completed with green swabs to clear oral cavity of dried secretions.  Lingual: No impairment  Velum: Flaccid  Gag Reflex: Absent (tactile stimulation of faucial pillars revealed absent gag)    P.O. Trials:  Patient Position: Upright in bed    The patient was given the following:  Consistency Presented: Ice chips  How Presented: SLP-fed/presented;Spoon    ORAL PHASE:  Bolus Acceptance: No impairment  Bolus Formation/Control: Impaired . No mastication of ice with verbal cues.  Propulsion: Absent  Type of Impairment: Incomplete;Mastication;Spillage  PHARYNGEAL PHASE:  Initiation of Swallow: Delayed  (over 10 seconds)  Laryngeal Elevation: Decreased  Aspiration Signs/Symptoms: Weak cough  after each ice chip bolus. Wet cough. Patient unable to clear with max verbal cues.  Vocal Quality: Aphonic  Pain:  Pain Screen  Pain Scale: Visual  Pain Intensity: 0  Faces (Wong-Baker) Scale: Hurts even more  Pain Onset: Movement  Pain  Location: Arm  Pain Orientation: Left  Pain Intervention(s): Repositioned   After treatment:  [X]    Patient left in no apparent distress  [X]    Call bell left within reach  [ ]    Caregiver present  [ ]    Patient left up in chair, nursing staff notified      ASSESSMENT:   Based on the objective data described above, the patient presents with moderate-severe oropharyngeal dysphagia characterized by poor oral manipulation, delayed swallow initiation and reduced hyolaryngeal elevation. Patient with overt s/s of aspiration x2 with both ice chip boluses. No further PO trials attempted. Mental status remains reduced which is also affecting ability to swallow safely. Not yet appropriate for PO; recommend NPO.    Patient will benefit from skilled intervention to address the above impairments.    Patient???s rehabilitation potential is considered to be Fair    Factors which may influence rehabilitation potential include:   [ ]   None noted  [ ]    Mental ability/status  [ ]    Medical condition  [ ]    Home/family situation and support systems  [ ]    Safety awareness  [ ]    Pain tolerance/management  [ ]   Other:      PLAN OF CARE:   Recommendations and Planned Interventions:  [X]    NPO                                           [X]     Modified Barium Swallo w ??    Recommendations and planned interventions including recommended diet changes were discussed with:  [X]   Patient   [ ]    Family      [X]    Nursing  [ ]    Provider  [ ]   Posted safety precautions in patient???s room    Frequency/Duration:  Patient will be followed by speech-language pathology 4 times a week  to address goals.    Discharge Recommendations:  [X]   To be determined                      [ ]     Day rehabilitation program  [X]    Home without services             [ ]     Outpatient therapy  [ ]    Home with Home Health services         [ ]     Nursing facility for rehab  [ ]    Inpatient Rehab Hospital            [ ]     Nursing facility for long term care  [ ]    Other:    Communication/Collaboration:  [ ]   Patient/family have participated as able in goal setting and plan of care.  [ ]    Patient/family agree to work toward stated goals and plan of care.  [ ]   Patient understands intent and goals of therapy, but is neutral about his/her participation.  [X]    Patient is unable to participate in goal setting and plan of care.               The patient???s plan of care was discussed with:  Registered Nurse    Thank you for this referral.    Reginia Forts, SLP    Time Calculation: 15 mins

## 2009-05-10 NOTE — Progress Notes (Signed)
[facility] called and states, "pt PEG tube will not be placed today due to having to close the unit down for a couple of hours.  PEG tube will be placed tomorrow.

## 2009-05-10 NOTE — Progress Notes (Addendum)
Hospitalist Progress Note         NAME: Breanna Lester        DOB:  [birth year]        MRN:  [id number]      Assessment & Plan   Sepsis syndrome :underlying pneumonia, fever, leukocytosis  ????????????????????????Broad spectrum antibiotics.  ????????????????????????No evidence of UTI or ascites(SBP), has an umbilical hernia that is not     reducible but overall adbomen is soft, has RUQ pain, CT gallbladder sludge and thickenned wall    Acute respiratory failure:got extubated still fighting PNA    Altered mental status : multifactorial Sepsis, hypernatremia, ARF, hyperamonemia     Hypernatremia: improving    Pneumonia: c/w levaquin , zosyn and Vancomycin, will get advice from ID.    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     Review of Systems:  Fever/chills y   Cough N   Sputum    N   SOB/DOE  N   Chest Pain N   Abdominal Pain N   Diarrhea N   Constipation N   Nausea/Vomiting N   Dysuria N   Tolerating PT N   Tolerating Diet N   Could not obtain due to AMS vs intubation xx        Objective:   Patient extubated yesterday, critically ill, sedation was D/C more awake today does not follow commands, NGT D/Ced patient might need PEG for feeding    VITALS:    Last 24hrs VS reviewed since prior progress note. Most recent are:  Visit Vitals   Item Reading   ??? BP 107/56   ??? Pulse 78   ??? Temp 99.5 ??F (37.5 ??C)   ??? Resp 18   ??? Ht 5\' 2"  (1.575 m)   ??? Wt 144 lb 13.5 oz (65.7 kg)   ??? SpO2 97%   ??? PF 40L/min           Intake/Output Summary (Last 24 hours) at 05/10/09 0747  Last data filed at 05/10/09 0700   Gross per 24 hour   Intake   3920 ml   Output   2880 ml   Net   1040 ml        Telemetry Reviewed:     PHYSICAL EXAM:  General: Extubated, awake , AMS  HEENT: Pupils unequal, R 3mm, L 2mm  Lungs:  Bilateral rales, ronchi and mild wheezing.  Heart:  Regular rhythm, tachycardic, SEM  Abdomen: Soft, Non distended, hypoactive BS, no rebound, umbilical hernia non reducible,  Extremities: No edema, pulses present, brace in left arm  Neurologic:?? GCS [id number], no gross deficit  Psych:???? AMS    Lab Data Reviewed: (see below)  Medications Reviewed: (see below)    PMH/SH reviewed - no change compared to H&P  ______________________________________________________________________  Total time spent with patient: 45 minutes     Critical Care Provided     Minutes non procedure based    Care Plan discussed with:  Patient    Family    RN x   Care Manager    Consultant/Specialist      >50% of visit spent in counseling and coordination of care       Prophylaxis:  GI PPI   DVT lovenox     Disposition:   Home with Family    HH/PT/OT/RN    SNF/LTC    [facility]      ______________________________________________________________________  Attending Physician: Hazeline Junker, MD   _____________________________________________________________________________________________________  Procedures: see electronic medical records for all procedures/Xrays and details  which were not copied into this note but were reviewed prior to creation of Plan.      LABS:  Recent Labs   Basename 05/10/09 0550 05/09/09 0510   ??? WBC 11.0 12.6*   ??? HGB 8.5* 9.0*   ??? HCT 28.0* 29.9*   ??? PLT 67* 56*         Recent Labs    Basename 05/10/09 0340 05/09/09 0510 05/08/09 0415   ??? NA 149* 153* 160*   ??? K 3.5 3.8 3.6   ??? CL 118* 124* 126*   ??? CO2 22 22 21    ??? BUN 15 24* 29*   ??? CREA 0.8 0.9 1.1   ??? GLU 114* 120* 95   ??? CA 8.7 8.5 8.0*   ??? MG 1.9 2.4 --   ??? PHOS -- -- --   ??? URICA -- -- --         Recent Labs   Basename 05/10/09 0340 05/09/09 0510 05/08/09 0415   ??? SGOT 41* 52* 54*   ??? GPT 71 82* 83*   ??? AP 112 110 105   ??? TBIL 3.9* 4.1* 3.8*   ??? TP 5.6* 5.7* 5.6*   ??? ALB 2.2* 2.2* 2.1*   ??? GLOB 3.4 3.5 3.5   ??? GGT -- -- --   ??? AML -- -- --   ??? LPSE -- -- --         Recent Labs   Basename 05/10/09 0340   ??? INR 1.7*   ??? PTP 16.7*   ??? APTT --          No results found for this basename: FE:2,TIBC:2,PSAT:2,FERR:2, in the last 72 hours   Recent Labs   Basename 05/09/09 1844 05/09/09 1407   ??? PH 7.41 7.42   ??? PCO2 37 30*   ??? PO2 106* 134*         Recent Labs   Basename 05/09/09 0510   ??? CPK 229*   ??? CKMB --         Lab Results   Component Value Date/Time    POC GLUCOSE 114 05/01/2009  4:40 PM    POC GLUCOSE 162 05/01/2009 12:02 PM    POC GLUCOSE 115 05/01/2009  8:39 AM         Lab Results   Component Value Date/Time    Color DARK YELLOW 05/09/2009 11:05 AM    Appearance CLEAR 05/09/2009 11:05 AM    Specific gravity 1.010 10/29/2008 11:00 AM    Specific gravity 1.024 05/09/2009 11:05 AM    pH 6.0 05/09/2009 11:05 AM    Protein TRACE 05/09/2009 11:05  AM    Glucose NEGATIVE  05/09/2009 11:05 AM    Ketone NEGATIVE  05/09/2009 11:05 AM    Bilirubin NEGATIVE  04/30/2009 12:14 AM    Urobilinogen 0.2 05/09/2009 11:05 AM    Nitrites NEGATIVE  05/09/2009 11:05 AM    Leukocyte Esterase SMALL 05/09/2009 11:05 AM    Epithelial cells 0-5 05/09/2009 11:05 AM    Bacteria NEGATIVE  05/09/2009 11:05 AM    WBC 5-10 05/09/2009 11:05 AM    RBC 10-20 05/09/2009 11:05 AM           MEDICATIONS:  Current facility-administered medications   Medication Dose Route Frequency   ??? levofloxacin (LEVAQUIN) 750 mg infusion   750 mg IntraVENous Q24H    ??? barium sulfate (READICAT) 2 % oral suspension 900 mL  900 mL Oral RAD ONCE   ??? vancomycin (VANCOCIN) 0.75 g in 0.9% sodium chloride 250 mL IVPB  750 mg IntraVENous Q12H   ??? 0.9% sodium chloride infusion    IntraVENous RAD ONCE   ??? ioversol (OPTIRAY) 350 mg/mL contrast solution 100 mL  100 mL IntraVENous RAD ONCE   ??? sodium chloride (NS) flush 10 mL  10 mL IntraVENous RAD ONCE   ??? dextrose 5% infusion    IntraVENous CONTINUOUS   ??? piperacillin-tazobactam (ZOSYN) 3.375 g in 0.9% sodium chloride (MBP/ADV) 100 mL MBP  3.375 g IntraVENous Q6H   ??? enoxaparin (LOVENOX) injection 70 mg  70 mg SubCUTAneous Q12H   ??? levothyroxine (SYNTHROID) tablet 50 mcg  50 mcg Oral ACB   ??? lactulose (CHRONULAC) solution 20 g  20 g Oral BID   ??? aspirin (ASPIRIN) tablet 325 mg  325 mg Oral DAILY   ??? albumin human 5% (BUMINATE) solution 25 g  25 g IntraVENous Q4H PRN   ??? metoprolol tartrate (LOPRESSOR) injection 2.5 mg  2.5 mg IntraVENous Q6H   ??? DISCONTD: vancomycin (VANCOCIN) 1 g in 0.9% sodium chloride 250 mL IVPB  1,000 mg IntraVENous Q24H   ??? morphine injection 1 mg  1 mg IntraVENous PRN   ??? pantoprazole (PROTONIX) injection 40 mg  40 mg IntraVENous DAILY   ??? albuterol/ipratropium (DUONEB) neb solution   1 Dose Nebulization Q4HWA RT   ??? midazolam (VERSED) injection 2-4 mg  2-4 mg IntraVENous Q1H PRN   ??? sodium chloride (NS) flush 10 mL  10 mL InterCATHeter Q8H   ??? sodium chloride (NS) flush 10 mL  10 mL InterCATHeter PRN   ??? heparin (porcine) pf 300 Units  300 Units InterCATHeter PRN   ??? sodium chloride (NS) flush 20 mL  20 mL InterCATHeter PRN   ??? acetaminophen (TYLENOL) suppository 650 mg  650 mg Rectal Q6H PRN   ??? albuterol (PROVENTIL VENTOLIN) nebulizer solution 2.5 mg  2.5 mg Nebulization Q2H PRN   ??? thiamine (B-1) 100 mg in 0.9% sodium chloride 50 mL IVPB  100 mg IntraVENous DAILY   ??? nitroglycerin (NITROBID) 2 % ointment 2 Inch  2 Inch Topical Q6H PRN   ??? saline peripheral flush 5 mL  5 mL InterCATHeter PRN

## 2009-05-10 NOTE — Progress Notes (Signed)
Nutrition F/U:  Thank you for TF order.?? Will F/U Tuesday to check toleration.?? TF not started yet; NGT pulled, pt may need PEG for nutrition.    Recommendations:  1.Now that Propofol and d5 are d/c'd, recommended goal rate of TF is 40 ml/hr.  2.Strict aspiration precautions ??  3.HOB >30 degrees  Maudie Flakes, PhD, RD, CNSD  [phone]

## 2009-05-10 NOTE — Progress Notes (Signed)
Neurology Progress Note    Patient ID:  Breanna Lester  [id number]  48 y.o.  14-Feb-1961    Subjective:      Patient has complaints lethargy..    Current facility-administered medications   Medication Dose Route Frequency   ??? lactulose (CHRONULAC) solution 20 g  20 g Oral BID   ??? aspirin chewable tablet 81 mg  81 mg Oral DAILY   ??? levothyroxine (SYNTHROID) injection 25 mcg  25 mcg IntraVENous DAILY   ??? TPN ADULT - PERIPHERAL     IntraVENous CONTINUOUS   ??? levofloxacin (LEVAQUIN) 750 mg infusion   750 mg IntraVENous Q24H   ??? vancomycin (VANCOCIN) 0.75 g in 0.9% sodium chloride 250 mL IVPB  750 mg IntraVENous Q12H   ??? 0.9% sodium chloride infusion    IntraVENous RAD ONCE   ??? ioversol (OPTIRAY) 350 mg/mL contrast solution 100 mL  100 mL IntraVENous RAD ONCE   ??? sodium chloride (NS) flush 10 mL  10 mL IntraVENous RAD ONCE   ??? dextrose 5% infusion    IntraVENous CONTINUOUS   ??? piperacillin-tazobactam (ZOSYN) 3.375 g in 0.9% sodium chloride (MBP/ADV) 100 mL MBP  3.375 g IntraVENous Q6H   ??? enoxaparin (LOVENOX) injection 70 mg  70 mg SubCUTAneous Q12H   ??? DISCONTD: levothyroxine (SYNTHROID) tablet 50 mcg  50 mcg Oral ACB   ??? albumin human 5% (BUMINATE) solution 25 g  25 g IntraVENous Q4H PRN   ??? metoprolol tartrate (LOPRESSOR) injection 2.5 mg  2.5 mg IntraVENous Q6H   ??? DISCONTD: lactulose (CHRONULAC) solution 20 g  20 g Oral BID   ??? DISCONTD: aspirin (ASPIRIN) tablet 325 mg  325 mg Oral DAILY   ??? DISCONTD: vancomycin (VANCOCIN) 1 g in 0.9% sodium chloride 250 mL IVPB  1,000 mg IntraVENous Q24H   ??? morphine injection 1 mg  1 mg IntraVENous PRN   ??? pantoprazole (PROTONIX) injection 40 mg  40 mg IntraVENous DAILY   ??? albuterol/ipratropium (DUONEB) neb solution   1 Dose Nebulization Q4HWA RT   ??? midazolam (VERSED) injection 2-4 mg  2-4 mg IntraVENous Q1H PRN   ??? sodium chloride (NS) flush 10 mL  10 mL InterCATHeter Q8H   ??? sodium chloride (NS) flush 10 mL  10 mL InterCATHeter PRN    ??? heparin (porcine) pf 300 Units  300 Units InterCATHeter PRN   ??? sodium chloride (NS) flush 20 mL  20 mL InterCATHeter PRN   ??? acetaminophen (TYLENOL) suppository 650 mg  650 mg Rectal Q6H PRN   ??? albuterol (PROVENTIL VENTOLIN) nebulizer solution 2.5 mg  2.5 mg Nebulization Q2H PRN   ??? thiamine (B-1) 100 mg in 0.9% sodium chloride 50 mL IVPB  100 mg IntraVENous DAILY   ??? nitroglycerin (NITROBID) 2 % ointment 2 Inch  2 Inch Topical Q6H PRN   ??? saline peripheral flush 5 mL  5 mL InterCATHeter PRN          Review of Systems:    A comprehensive review of systems was negative except for: Neurological: positive for speech problems and weakness    Objective:     Patient Vitals in the past 8 hrs:   BP Temp Pulse Resp SpO2   05/10/09 1000 110/45 mmHg - 82  32  95 %   05/10/09 0900 99/40 mmHg - 83  34  96 %   05/10/09 0800 105/51 mmHg 97.7 ??F (36.5 ??C) 79  37  97 %   05/10/09 0700 107/56 mmHg - 78  18  97 %   05/10/09 0600 92/40 mmHg - 77  22  97 %   05/10/09 0538 103/44 mmHg - 81  - -   05/10/09 0500 92/41 mmHg - 76  27  97 %   05/10/09 0400 108/43 mmHg - 73  34  97 %   05/10/09 0300 107/44 mmHg - 73  33  99 %         In: 525 (525 I.V.)  Out: 220 (220 Urine)    In: 5309.6 (3694.6 I.V.)  Out: 3830 (2150 Urine 1300 Drains)      Lab Review Recent Results (from the past 24 hour(s))   URINALYSIS W/MICROSCOPIC    Collection Time    05/09/09 11:05 AM   Component Value Range   ??? Color DARK YELLOW     ??? Appearance CLEAR     ??? Specific gravity 1.024  1.003 - 1.030 ( )   ??? pH 6.0  5.0 - 8.0 ( )   ??? Protein TRACE (*) NEGATIVE (MG/DL)   ??? Glucose NEGATIVE   NEGATIVE (MG/DL)   ??? Ketone NEGATIVE   NEGATIVE (MG/DL)   ??? Blood NEGATIVE   NEGATIVE    ??? Urobilinogen 0.2  0.2 - 1.0 (EU/DL)   ??? Nitrites NEGATIVE   NEGATIVE    ??? Leukocyte Esterase SMALL (*) NEGATIVE    ??? WBC 5-10  0 - 4 (/HPF)   ??? RBC 10-20  0 - 5 (/HPF)   ??? Epithelial cells 0-5  0 - 5 (/LPF)   ??? Bacteria NEGATIVE   NEGATIVE (/HPF)   ??? Hyaline Cast 0-2  0 - 2     BILIRUBIN, CONFIRM    Collection Time    05/09/09 11:05 AM   Component Value Range   ??? Ictotest POSITIVE (*) NEGATIVE    BLOOD GAS, ARTERIAL    Collection Time    05/09/09  2:07 PM   Component Value Range   ??? pH 7.42  7.35 - 7.45 ( )   ??? PCO2 30 (*) 35 - 45 (mmHg)   ??? PO2 134 (*) 80 - 100 (mmHg)   ??? O2 SAT 99 (*) 92 - 97 (%)   ??? BICARBONATE 19 (*) 22 - 26 (mmol/L)   ??? BASE DEFICIT 3.9 (*) BE NORMAL RANGE -3 T (mmol/L)   ??? FIO2 40  (%)   ??? SITE RR     ??? MODE CPAP     ??? SPONTANEOUS RATE 33  ( )   ??? PEEP/CPAP 5  ( )   ??? PRESSURE SUPPORT 5  ( )   ??? SAMPLE SOURCE ARTERIAL     ??? O2 METHOD VENTILATOR     ??? RESPIRATORY COMMENT  CVRB TO DR Fraser Din @ 1418     BLOOD GAS, ARTERIAL    Collection Time    05/09/09  6:44 PM   Component Value Range   ??? pH 7.41  7.35 - 7.45 ( )   ??? PCO2 37  35 - 45 (mmHg)   ??? PO2 106 (*) 80 - 100 (mmHg)   ??? O2 SAT 98 (*) 92 - 97 (%)   ??? BICARBONATE 22  22 - 26 (mmol/L)   ??? BASE DEFICIT 1.8 (*) BE NORMAL RANGE -3 T (mmol/L)   ??? FIO2 40  (%)   ??? SITE RR     ??? MODE CPAP     ??? SPONTANEOUS RATE 32  ( )   ??? PEEP/CPAP 5  ( )   ???  PRESSURE SUPPORT 5  ( )   ??? SAMPLE SOURCE ARTERIAL     ??? O2 METHOD VENTILATOR     METABOLIC PANEL, COMPREHENSIVE    Collection Time    05/10/09  3:40 AM   Component Value Range   ??? Sodium 149 (*) 136 - 145 (MMOL/L)   ??? Potassium 3.5  3.5 - 5.1 (MMOL/L)   ??? Chloride 118 (*) 97 - 108 (MMOL/L)   ??? CO2 22  21 - 32 (MMOL/L)   ??? Anion gap 9  5 - 15 (mmol/L)   ??? Glucose 114 (*) 65 - 100 (MG/DL)   ??? BUN 15  6 - 20 (MG/DL)   ??? Creatinine 0.8  0.6 - 1.3 (MG/DL)   ??? BUN/Creatinine ratio 19  12 - 20 ( )   ??? GFR est AA >60  >60 (ml/min/1.48m2)   ??? GFR est non-AA >60  >60 (ml/min/1.47m2)   ??? Calcium 8.7  8.5 - 10.1 (MG/DL)   ??? Bilirubin, total 3.9 (*) 0.2 - 1.0 (MG/DL)   ??? ALT 71  12 - 78 (U/L)   ??? AST 41 (*) 15 - 37 (U/L)   ??? Alk. phosphatase 112  50 - 136 (U/L)   ??? Protein, total 5.6 (*) 6.4 - 8.2 (g/dL)   ??? Albumin 2.2 (*) 3.5 - 5.0 (g/dL)   ??? Globulin 3.4  2.0 - 4.0 (g/dL)    ??? A-G Ratio 0.6 (*) 1.1 - 2.2 ( )   MAGNESIUM    Collection Time    05/10/09  3:40 AM   Component Value Range   ??? Magnesium 1.9  1.6 - 2.4 (MG/DL)   PROTHROMBIN TIME    Collection Time    05/10/09  3:40 AM   Component Value Range   ??? INR 1.7 (*) 0.9 - 1.1 ( )   ??? Prothrombin Time-PT 16.7 (*) 9.0 - 11.0 (SECS)   CBC WITH AUTOMATED DIFF    Collection Time    05/10/09  5:50 AM   Component Value Range   ??? WBC 11.0  3.6 - 11.0 (K/uL)   ??? RBC 3.13 (*) 3.80 - 5.20 (M/uL)   ??? HGB 8.5 (*) 11.5 - 16.0 (g/dL)   ??? HCT 28.0 (*) 35.0 - 47.0 (%)   ??? MCV 89.5  80.0 - 99.0 (FL)   ??? MCH 27.2  26.0 - 34.0 (PG)   ??? MCHC 30.4  30.0 - 36.5 (g/dL)   ??? RDW 19.9 (*) 11.5 - 14.5 (%)   ??? PLATELET 67 (*) 150 - 400 (K/uL)   ??? NEUTROPHILS 81 (*) 32 - 75 (%)   ??? LYMPHOCYTES 16  12 - 49 (%)   ??? MONOCYTES 0 (*) 5 - 13 (%)   ??? EOSINOPHILS 3  0 - 7 (%)   ??? BASOPHILS 0  0 - 1 (%)   ??? ABSOLUTE NEUTS 8.9 (*) 1.8 - 8.0 (K/UL)   ??? ABSOLUTE LYMPHS 1.8  0.8 - 3.5 (K/UL)   ??? ABSOLUTE MONOS 0.0  0.0 - 1.0 (K/UL)   ??? ABSOLUTE EOSINS 0.3  0.0 - 0.4 (K/UL)   ??? ABSOLUTE BASOS 0.0  0.0 - 0.1 (K/UL)   ??? RBC COMMENTS 1+ ANISOCYTOSIS     ??? DF MANUAL           Additional comments:I reviewed the patient's other test results. labs      NEUROLOGICAL EXAM:    Appearance:  The patient is well developed, well nourished, lethargic   Mental  Status: Oriented to person. Mood and affect depressed   Cranial Nerves:   Decreased visual fields bilaterally. Fundi are not seen, PERLA, EOM's full, no nystagmus, no ptosis. Facial sensation is normal. Corneal reflexes are intact. Facial movement is symmetric. Hearing is normal bilaterally. Palate is midline with normal sternocleidomastoid and trapezius muscles are normal. Tongue is midline.   Motor:  4/5 strength in upper and lower proximal and distal muscles. Normal bulk and tone. No fasciculations.   Reflexes:   Deep tendon reflexes 1+/4 and symmetrical.   Sensory:   Normal to touch, pinprick    Gait:  Not tested.    Tremor:   No tremor noted.   Cerebellar:  No cerebellar signs present.   Neurovascular:  Normal heart sounds and regular rhythm, peripheral pulses intact, and no carotid bruits.         Assessment:     Patient Active Hospital Problem List:  Nausea with vomiting (05/01/2009)    Hypokalemia (05/01/2009)    Hepatic encephalopathy (05/01/2009)    Gait abnormality (05/01/2009)    Alcoholic cirrhosis of liver (05/01/2009)    Hypomagnesemia (05/01/2009)      Plan:   Encephalopathy better, extubated but still slow  Continue Treatment and will followagain on wed      Signed:  Missy Baksh A. Katrinka Blazing, MD  05/10/2009  10:59 AM

## 2009-05-10 NOTE — Progress Notes (Signed)
PULMONARY/Critical Care/SLEEP MEDICINE ??? [facility]  Name: Breanna Lester   DOB: 04/26/1961   MRN: [id number]   Date: 05/10/2009 9:35 AM   [x]     I have reviewed the flowsheet and previous day???s notes.  []    The patient is unable to give any meaningful history or review of systems because the patient is:  []    Intubated []       []    Sedated    []    Unresponsive      []    The patient is critically ill on      []    Mechanical ventilation []    Pressors   []    BiPAP []                     ROS:A comprehensive review of systems was negative except for: Respiratory: positive for dyspnea on exertion  Musculoskeletal: positive for muscle weakness    Vital Signs:    BP 99/40   Pulse 83   Temp 97.7 ??F (36.5 ??C)   Resp 34   Ht 5\' 2"  (1.575 m)   Wt 144 lb 13.5 oz (65.7 kg)   SpO2 96%   PF 40 L/min    O2 Device: Nasal cannula   O2 Flow Rate (L/min): 2 l/min   Temp (24hrs), Avg:98.7 ??F (37.1 ??C), Min:97.7 ??F (36.5 ??C), Max:99.5 ??F (37.5 ??C)       Intake/Output:   Last shift:      In: 375 (375 I.V.)  Out: 135 (135 Urine)    Last 3 shifts: In: 5309.6 (3694.6 I.V.)  Out: 3830 (2150 Urine 1300 Drains)      Intake/Output Summary (Last 24 hours) at 05/10/09 0935  Last data filed at 05/10/09 0900   Gross per 24 hour   Intake   3890 ml   Output   2780 ml   Net   1110 ml       Hemodynamics:   PAP:     Wedge:     CVP:      CO:     CI:     SVR:     PVR:        Ventilator Settings:    Physical Exam:    General: []    Intubated/sedated []    No acute distress []        [x]    Ill appearing []     []           HEENT: []    Icteric [x]    PERRL []        [x]    Anicteric Mucosa:[x]    moist   []    dry []          Resp: []    Wheeze []    Clear to ascultation bilaterally []    Accessory muscle use    [x]    Rales []    Chest tube:  []    Air leak []        [x]    Ronchi []    Crepitus []       []    Coarse bilateral ventilator breath sounds      CV: [x]    Regular []    Tachycardia []        []    Irregular []    Bradycardic []         []    Murmur []    S3 []        []    Edema []    S4 []          GI: [x]   Soft  []    NG Tube Bowel sounds: [x]    present []    absent    []    Firm []    PEG []        []    Distended  []          GU: [x]    Foley []    Hematuria []        [x]    Clear urine []    Edema []          MSK:    []    SCD???s []    Edema []        [x]    No deformities []     []          Skin: [x]    Warm [x]    Dry  []        []    Cool []    Moist []        []    Hot  []    Diaphoretic []        []    Rash  []    Cyanosis []          N-psych: []    Sedated  []    Agitated []        [x]    Alert  Follows commands:   [x]    Yes  []    No []          Devices: []    ET-Tube []    Tracheostomy []    Chest tube: Air leak? []    Y/[]    N    []    Central Line []    PA Catheter []        []    PICC []     []          DATA:   Current facility-administered medications   Medication Dose Route Frequency   ??? lactulose (CHRONULAC) solution 20 g  20 g Oral BID   ??? aspirin chewable tablet 81 mg  81 mg Oral DAILY   ??? levothyroxine (SYNTHROID) injection 25 mcg  25 mcg IntraVENous DAILY   ??? TPN ADULT - PERIPHERAL     IntraVENous CONTINUOUS   ??? levofloxacin (LEVAQUIN) 750 mg infusion   750 mg IntraVENous Q24H   ??? vancomycin (VANCOCIN) 0.75 g in 0.9% sodium chloride 250 mL IVPB  750 mg IntraVENous Q12H   ??? 0.9% sodium chloride infusion    IntraVENous RAD ONCE   ??? ioversol (OPTIRAY) 350 mg/mL contrast solution 100 mL  100 mL IntraVENous RAD ONCE   ??? sodium chloride (NS) flush 10 mL  10 mL IntraVENous RAD ONCE   ??? dextrose 5% infusion    IntraVENous CONTINUOUS   ??? piperacillin-tazobactam (ZOSYN) 3.375 g in 0.9% sodium chloride (MBP/ADV) 100 mL MBP  3.375 g IntraVENous Q6H   ??? enoxaparin (LOVENOX) injection 70 mg  70 mg SubCUTAneous Q12H   ??? DISCONTD: levothyroxine (SYNTHROID) tablet 50 mcg  50 mcg Oral ACB   ??? albumin human 5% (BUMINATE) solution 25 g  25 g IntraVENous Q4H PRN   ??? metoprolol tartrate (LOPRESSOR) injection 2.5 mg  2.5 mg IntraVENous Q6H    ??? DISCONTD: lactulose (CHRONULAC) solution 20 g  20 g Oral BID   ??? DISCONTD: aspirin (ASPIRIN) tablet 325 mg  325 mg Oral DAILY   ??? DISCONTD: vancomycin (VANCOCIN) 1 g in 0.9% sodium chloride 250 mL IVPB  1,000 mg IntraVENous Q24H   ??? morphine injection 1 mg  1 mg IntraVENous PRN   ??? pantoprazole (PROTONIX) injection 40 mg  40 mg IntraVENous DAILY   ??? albuterol/ipratropium (DUONEB) neb  solution   1 Dose Nebulization Q4HWA RT   ??? midazolam (VERSED) injection 2-4 mg  2-4 mg IntraVENous Q1H PRN   ??? sodium chloride (NS) flush 10 mL  10 mL InterCATHeter Q8H   ??? sodium chloride (NS) flush 10 mL  10 mL InterCATHeter PRN   ??? heparin (porcine) pf 300 Units  300 Units InterCATHeter PRN   ??? sodium chloride (NS) flush 20 mL  20 mL InterCATHeter PRN   ??? acetaminophen (TYLENOL) suppository 650 mg  650 mg Rectal Q6H PRN   ??? albuterol (PROVENTIL VENTOLIN) nebulizer solution 2.5 mg  2.5 mg Nebulization Q2H PRN   ??? thiamine (B-1) 100 mg in 0.9% sodium chloride 50 mL IVPB  100 mg IntraVENous DAILY   ??? nitroglycerin (NITROBID) 2 % ointment 2 Inch  2 Inch Topical Q6H PRN   ??? saline peripheral flush 5 mL  5 mL InterCATHeter PRN         Telemetry: [x]    Sinus []    A-flutter []    Paced    []    A-fib []    Multiple PVC???s                  Labs:  Recent Labs   Basename 05/10/09 0550 05/09/09 0510 05/08/09 0415   ??? WBC 11.0 12.6* 10.3   ??? HGB 8.5* 9.0* 8.5*   ??? HCT 28.0* 29.9* 28.5*   ??? PLT 67* 56* 65*       Recent Labs   Basename 05/10/09 0340 05/09/09 0510 05/08/09 0415   ??? NA 149* 153* 160*   ??? K 3.5 3.8 3.6   ??? CL 118* 124* 126*   ??? CO2 22 22 21    ??? GLU 114* 120* 95   ??? BUN 15 24* 29*   ??? CREA 0.8 0.9 1.1   ??? CA 8.7 8.5 8.0*   ??? MG 1.9 2.4 --   ??? PHOS -- -- --   ??? ALB 2.2* 2.2* 2.1*   ??? TBIL 3.9* 4.1* 3.8*   ??? SGOT 41* 52* 54*   ??? INR 1.7* -- --       Recent Labs   Basename 05/09/09 1844 05/09/09 1407 05/09/09 0445   ??? PH 7.41 7.42 7.39   ??? PCO2 37 30* 36   ??? PO2 106* 134* 108*   ??? HCO3 22 19* 21*   ??? FIO2 40 40 40         Imaging:   []    I have personally reviewed the patient???s radiographs  []    Radiographs reviewed with radiologist   []    No change from prior, tubes and lines in adequate position  []    Improved   []    Worsening     IMPRESSION:     The patient is: [x]    acutely ill Risk of deterioration: [x]    moderate    []    critically ill  []    high     Patient Active Hospital Problem List:  Nausea with vomiting (05/01/2009)    Hypokalemia (05/01/2009)    Hepatic encephalopathy (05/01/2009)    Gait abnormality (05/01/2009)    Alcoholic cirrhosis of liver (05/01/2009)    Hypomagnesemia (05/01/2009)      ??     PLAN:  1. Wean O2  2. Cont iv abx  3. PUD/DVT prophylaxis  4. PO as tol  5. Transfuse as needed  6. DNR     [x]    See my orders for details  My assessment/plan was discussed with:  [x]    nursing []    PT/OT    []    respiratory therapy []    Dr.   []    family []         []    Total critical care time exclusive of procedures       minutes  Vickki Muff, MD

## 2009-05-10 NOTE — Progress Notes (Signed)
Cardiology Progress Note      05/10/2009 10:18 AM    Admit Date: 04/29/2009      Subjective:     Extubated, on nasal O2.  Sleeping but arousable, not following commands for me.    BP 110/45   Pulse 82   Temp 97.7 ??F (36.5 ??C)   Resp 32   Ht 5\' 2"  (1.575 m)   Wt 144 lb 13.5 oz (65.7 kg)   SpO2 95%   PF 40 L/min  Current facility-administered medications   Medication Dose Route Frequency   ??? lactulose (CHRONULAC) solution 20 g  20 g Oral BID   ??? aspirin chewable tablet 81 mg  81 mg Oral DAILY   ??? levothyroxine (SYNTHROID) injection 25 mcg  25 mcg IntraVENous DAILY   ??? TPN ADULT - PERIPHERAL     IntraVENous CONTINUOUS   ??? levofloxacin (LEVAQUIN) 750 mg infusion   750 mg IntraVENous Q24H   ??? vancomycin (VANCOCIN) 0.75 g in 0.9% sodium chloride 250 mL IVPB  750 mg IntraVENous Q12H   ??? 0.9% sodium chloride infusion    IntraVENous RAD ONCE   ??? ioversol (OPTIRAY) 350 mg/mL contrast solution 100 mL  100 mL IntraVENous RAD ONCE   ??? sodium chloride (NS) flush 10 mL  10 mL IntraVENous RAD ONCE   ??? dextrose 5% infusion    IntraVENous CONTINUOUS   ??? piperacillin-tazobactam (ZOSYN) 3.375 g in 0.9% sodium chloride (MBP/ADV) 100 mL MBP  3.375 g IntraVENous Q6H   ??? enoxaparin (LOVENOX) injection 70 mg  70 mg SubCUTAneous Q12H   ??? DISCONTD: levothyroxine (SYNTHROID) tablet 50 mcg  50 mcg Oral ACB   ??? albumin human 5% (BUMINATE) solution 25 g  25 g IntraVENous Q4H PRN   ??? metoprolol tartrate (LOPRESSOR) injection 2.5 mg  2.5 mg IntraVENous Q6H   ??? DISCONTD: lactulose (CHRONULAC) solution 20 g  20 g Oral BID   ??? DISCONTD: aspirin (ASPIRIN) tablet 325 mg  325 mg Oral DAILY   ??? DISCONTD: vancomycin (VANCOCIN) 1 g in 0.9% sodium chloride 250 mL IVPB  1,000 mg IntraVENous Q24H   ??? morphine injection 1 mg  1 mg IntraVENous PRN   ??? pantoprazole (PROTONIX) injection 40 mg  40 mg IntraVENous DAILY   ??? albuterol/ipratropium (DUONEB) neb solution   1 Dose Nebulization Q4HWA RT   ??? midazolam (VERSED) injection 2-4 mg  2-4 mg IntraVENous Q1H PRN    ??? sodium chloride (NS) flush 10 mL  10 mL InterCATHeter Q8H   ??? sodium chloride (NS) flush 10 mL  10 mL InterCATHeter PRN   ??? heparin (porcine) pf 300 Units  300 Units InterCATHeter PRN   ??? sodium chloride (NS) flush 20 mL  20 mL InterCATHeter PRN   ??? acetaminophen (TYLENOL) suppository 650 mg  650 mg Rectal Q6H PRN   ??? albuterol (PROVENTIL VENTOLIN) nebulizer solution 2.5 mg  2.5 mg Nebulization Q2H PRN   ??? thiamine (B-1) 100 mg in 0.9% sodium chloride 50 mL IVPB  100 mg IntraVENous DAILY   ??? nitroglycerin (NITROBID) 2 % ointment 2 Inch  2 Inch Topical Q6H PRN   ??? saline peripheral flush 5 mL  5 mL InterCATHeter PRN           Objective:      Physical Exam:  Chest--scattered rhonchi, exp wheezing  CV--rrr Nl S1S@ I/VI sem  ABD--normal BS  Ext--unchanged    Data Review:   Labs:  Recent Results (from the past 24 hour(s))  URINALYSIS W/MICROSCOPIC    Collection Time    05/09/09 11:05 AM   Component Value Range   ??? Color DARK YELLOW     ??? Appearance CLEAR     ??? Specific gravity 1.024  1.003 - 1.030 ( )   ??? pH 6.0  5.0 - 8.0 ( )   ??? Protein TRACE (*) NEGATIVE (MG/DL)   ??? Glucose NEGATIVE   NEGATIVE (MG/DL)   ??? Ketone NEGATIVE   NEGATIVE (MG/DL)   ??? Blood NEGATIVE   NEGATIVE    ??? Urobilinogen 0.2  0.2 - 1.0 (EU/DL)   ??? Nitrites NEGATIVE   NEGATIVE    ??? Leukocyte Esterase SMALL (*) NEGATIVE    ??? WBC 5-10  0 - 4 (/HPF)   ??? RBC 10-20  0 - 5 (/HPF)   ??? Epithelial cells 0-5  0 - 5 (/LPF)   ??? Bacteria NEGATIVE   NEGATIVE (/HPF)   ??? Hyaline Cast 0-2  0 - 2    BILIRUBIN, CONFIRM    Collection Time    05/09/09 11:05 AM   Component Value Range   ??? Ictotest POSITIVE (*) NEGATIVE    BLOOD GAS, ARTERIAL    Collection Time    05/09/09  2:07 PM   Component Value Range   ??? pH 7.42  7.35 - 7.45 ( )   ??? PCO2 30 (*) 35 - 45 (mmHg)   ??? PO2 134 (*) 80 - 100 (mmHg)   ??? O2 SAT 99 (*) 92 - 97 (%)   ??? BICARBONATE 19 (*) 22 - 26 (mmol/L)   ??? BASE DEFICIT 3.9 (*) BE NORMAL RANGE -3 T (mmol/L)   ??? FIO2 40  (%)   ??? SITE RR     ??? MODE CPAP      ??? SPONTANEOUS RATE 33  ( )   ??? PEEP/CPAP 5  ( )   ??? PRESSURE SUPPORT 5  ( )   ??? SAMPLE SOURCE ARTERIAL     ??? O2 METHOD VENTILATOR     ??? RESPIRATORY COMMENT  CVRB TO DR Fraser Din @ 1418     BLOOD GAS, ARTERIAL    Collection Time    05/09/09  6:44 PM   Component Value Range   ??? pH 7.41  7.35 - 7.45 ( )   ??? PCO2 37  35 - 45 (mmHg)   ??? PO2 106 (*) 80 - 100 (mmHg)   ??? O2 SAT 98 (*) 92 - 97 (%)   ??? BICARBONATE 22  22 - 26 (mmol/L)   ??? BASE DEFICIT 1.8 (*) BE NORMAL RANGE -3 T (mmol/L)   ??? FIO2 40  (%)   ??? SITE RR     ??? MODE CPAP     ??? SPONTANEOUS RATE 32  ( )   ??? PEEP/CPAP 5  ( )   ??? PRESSURE SUPPORT 5  ( )   ??? SAMPLE SOURCE ARTERIAL     ??? O2 METHOD VENTILATOR     METABOLIC PANEL, COMPREHENSIVE    Collection Time    05/10/09  3:40 AM   Component Value Range   ??? Sodium 149 (*) 136 - 145 (MMOL/L)   ??? Potassium 3.5  3.5 - 5.1 (MMOL/L)   ??? Chloride 118 (*) 97 - 108 (MMOL/L)   ??? CO2 22  21 - 32 (MMOL/L)   ??? Anion gap 9  5 - 15 (mmol/L)   ??? Glucose 114 (*) 65 - 100 (MG/DL)   ??? BUN  15  6 - 20 (MG/DL)   ??? Creatinine 0.8  0.6 - 1.3 (MG/DL)   ??? BUN/Creatinine ratio 19  12 - 20 ( )   ??? GFR est AA >60  >60 (ml/min/1.85m2)   ??? GFR est non-AA >60  >60 (ml/min/1.65m2)   ??? Calcium 8.7  8.5 - 10.1 (MG/DL)   ??? Bilirubin, total 3.9 (*) 0.2 - 1.0 (MG/DL)   ??? ALT 71  12 - 78 (U/L)   ??? AST 41 (*) 15 - 37 (U/L)   ??? Alk. phosphatase 112  50 - 136 (U/L)   ??? Protein, total 5.6 (*) 6.4 - 8.2 (g/dL)   ??? Albumin 2.2 (*) 3.5 - 5.0 (g/dL)   ??? Globulin 3.4  2.0 - 4.0 (g/dL)   ??? A-G Ratio 0.6 (*) 1.1 - 2.2 ( )   MAGNESIUM    Collection Time    05/10/09  3:40 AM   Component Value Range   ??? Magnesium 1.9  1.6 - 2.4 (MG/DL)   PROTHROMBIN TIME    Collection Time    05/10/09  3:40 AM   Component Value Range   ??? INR 1.7 (*) 0.9 - 1.1 ( )   ??? Prothrombin Time-PT 16.7 (*) 9.0 - 11.0 (SECS)   CBC WITH AUTOMATED DIFF    Collection Time    05/10/09  5:50 AM   Component Value Range   ??? WBC 11.0  3.6 - 11.0 (K/uL)   ??? RBC 3.13 (*) 3.80 - 5.20 (M/uL)    ??? HGB 8.5 (*) 11.5 - 16.0 (g/dL)   ??? HCT 28.0 (*) 35.0 - 47.0 (%)   ??? MCV 89.5  80.0 - 99.0 (FL)   ??? MCH 27.2  26.0 - 34.0 (PG)   ??? MCHC 30.4  30.0 - 36.5 (g/dL)   ??? RDW 19.9 (*) 11.5 - 14.5 (%)   ??? PLATELET 67 (*) 150 - 400 (K/uL)   ??? NEUTROPHILS 81 (*) 32 - 75 (%)   ??? LYMPHOCYTES 16  12 - 49 (%)   ??? MONOCYTES 0 (*) 5 - 13 (%)   ??? EOSINOPHILS 3  0 - 7 (%)   ??? BASOPHILS 0  0 - 1 (%)   ??? ABSOLUTE NEUTS 8.9 (*) 1.8 - 8.0 (K/UL)   ??? ABSOLUTE LYMPHS 1.8  0.8 - 3.5 (K/UL)   ??? ABSOLUTE MONOS 0.0  0.0 - 1.0 (K/UL)   ??? ABSOLUTE EOSINS 0.3  0.0 - 0.4 (K/UL)   ??? ABSOLUTE BASOS 0.0  0.0 - 0.1 (K/UL)   ??? RBC COMMENTS 1+ ANISOCYTOSIS     ??? DF MANUAL           Telemetry: normal sinus rhythm      Assessment:     Patient Active Hospital Problem List:  Nausea with vomiting (05/01/2009)    Hypokalemia (05/01/2009)    Hepatic encephalopathy (05/01/2009)    Gait abnormality (05/01/2009)    Alcoholic cirrhosis of liver (05/01/2009)    Hypomagnesemia (05/01/2009)    NSTEMI  Plan:     Continue ASA, lovenox, beta blockers (no statin due to liver issues)  Continue multisystem support  Cardiac ischemic w/u at some point later when stable.        Ellwood Sayers, MD

## 2009-05-10 NOTE — Progress Notes (Signed)
Dr. Gwyndolyn Saxon with Radiology called and discussed the PICC line tip with ICU nurse Britta Mccreedy at 1010 hours on   05/10/2009. ?? Paula Libra, RN called PICC team Junious Dresser, RN) and updated them on the status of pt PICC.  Stopped all IV fluids.

## 2009-05-11 LAB — CULTURE, BLOOD

## 2009-05-11 LAB — METABOLIC PANEL, COMPREHENSIVE
A-G Ratio: 0.5 — ABNORMAL LOW (ref 1.1–2.2)
ALT (SGPT): 66 U/L (ref 12–78)
AST (SGOT): 39 U/L — ABNORMAL HIGH (ref 15–37)
Albumin: 2 g/dL — ABNORMAL LOW (ref 3.5–5.0)
Alk. phosphatase: 113 U/L (ref 50–136)
Anion gap: 10 mmol/L (ref 5–15)
BUN/Creatinine ratio: 18 (ref 12–20)
BUN: 14 MG/DL (ref 6–20)
Bilirubin, total: 3.3 MG/DL — ABNORMAL HIGH (ref 0.2–1.0)
CO2: 21 MMOL/L (ref 21–32)
Calcium: 8.7 MG/DL (ref 8.5–10.1)
Chloride: 123 MMOL/L — ABNORMAL HIGH (ref 97–108)
Creatinine: 0.8 MG/DL (ref 0.6–1.3)
GFR est AA: >60 mL/min/{1.73_m2} (ref >60)
GFR est non-AA: >60 mL/min/{1.73_m2} (ref >60)
Globulin: 3.7 g/dL (ref 2.0–4.0)
Glucose: 131 MG/DL — ABNORMAL HIGH (ref 65–100)
Potassium: 3.5 MMOL/L (ref 3.5–5.1)
Protein, total: 5.7 g/dL — ABNORMAL LOW (ref 6.4–8.2)
Sodium: 154 MMOL/L — ABNORMAL HIGH (ref 136–145)

## 2009-05-11 LAB — CBC WITH AUTOMATED DIFF
ABS. BASOPHILS: 0 10*3/uL (ref 0.0–0.1)
ABS. EOSINOPHILS: 0.1 10*3/uL (ref 0.0–0.4)
ABS. LYMPHOCYTES: 1 10*3/uL (ref 0.8–3.5)
ABS. MONOCYTES: 0.8 10*3/uL (ref 0.0–1.0)
ABS. NEUTROPHILS: 7.1 10*3/uL (ref 1.8–8.0)
BAND NEUTROPHILS: 3 % (ref 0–6)
BASOPHILS: 0 % (ref 0–1)
EOSINOPHILS: 1 % (ref 0–7)
HCT: 27.7 % — ABNORMAL LOW (ref 35.0–47.0)
HGB: 8.4 g/dL — ABNORMAL LOW (ref 11.5–16.0)
LYMPHOCYTES: 11 % — ABNORMAL LOW (ref 12–49)
MCH: 27.4 PG (ref 26.0–34.0)
MCHC: 30.3 g/dL (ref 30.0–36.5)
MCV: 90.2 FL (ref 80.0–99.0)
METAMYELOCYTES: 2 %
MONOCYTES: 9 % (ref 5–13)
NEUTROPHILS: 74 % (ref 32–75)
PLATELET: 58 10*3/uL — ABNORMAL LOW (ref 150–400)
RBC: 3.07 M/uL — ABNORMAL LOW (ref 3.80–5.20)
RDW: 20.3 % — ABNORMAL HIGH (ref 11.5–14.5)
WBC: 9.2 10*3/uL (ref 3.6–11.0)

## 2009-05-11 LAB — GLUCOSE, POC
Glucose (POC): 146 mg/dL — ABNORMAL HIGH (ref 65–105)
Glucose (POC): 157 mg/dL — ABNORMAL HIGH (ref 65–105)
Glucose (POC): 164 mg/dL — ABNORMAL HIGH (ref 65–105)

## 2009-05-11 LAB — MAGNESIUM: Magnesium: 2 MG/DL (ref 1.6–2.4)

## 2009-05-11 MED ADMIN — lactulose (CHRONULAC) solution 20 g: ORAL | @ 14:00:00 | NDC 00121457730

## 2009-05-11 MED ADMIN — enoxaparin (LOVENOX) injection 70 mg: SUBCUTANEOUS | @ 01:00:00 | NDC 00075062430

## 2009-05-11 MED ADMIN — enoxaparin (LOVENOX) injection 70 mg: SUBCUTANEOUS | @ 14:00:00 | NDC 00075062430

## 2009-05-11 MED ADMIN — TPN ADULT - PERIPHERAL: INTRAVENOUS | @ 22:00:00 | NDC 00517293025

## 2009-05-11 MED ADMIN — sodium chloride (NS) flush 10 mL: @ 16:00:00 | NDC 58016499501

## 2009-05-11 MED ADMIN — metoprolol tartrate (LOPRESSOR) injection 2.5 mg: INTRAVENOUS | @ 21:00:00 | NDC 00143987310

## 2009-05-11 MED ADMIN — sodium chloride (NS) flush 10 mL: @ 01:00:00 | NDC 87701099893

## 2009-05-11 MED ADMIN — lactulose (CHRONULAC) solution 20 g: ORAL | @ 01:00:00 | NDC 00121457730

## 2009-05-11 MED ADMIN — piperacillin-tazobactam (ZOSYN) 3.375 g in 0.9% sodium chloride (MBP/ADV) 100 mL MBP: INTRAVENOUS | @ 19:00:00 | NDC 00338055318

## 2009-05-11 MED ADMIN — sodium chloride (NS) flush 10 mL: @ 18:00:00 | NDC 87701099893

## 2009-05-11 MED ADMIN — thiamine (B-1) 100 mg in 0.9% sodium chloride 50 mL IVPB: INTRAVENOUS | @ 14:00:00 | NDC 63323001302

## 2009-05-11 MED ADMIN — sodium chloride (NS) flush 10 mL: @ 11:00:00 | NDC 87701099893

## 2009-05-11 MED ADMIN — metoprolol tartrate (LOPRESSOR) injection 2.5 mg: INTRAVENOUS | @ 16:00:00 | NDC 00143987310

## 2009-05-11 MED ADMIN — albuterol/ipratropium (DUONEB) neb solution: RESPIRATORY_TRACT | @ 12:00:00 | NDC 00487980101

## 2009-05-11 MED ADMIN — albuterol/ipratropium (DUONEB) neb solution: RESPIRATORY_TRACT | @ 19:00:00 | NDC 00487980101

## 2009-05-11 MED ADMIN — piperacillin-tazobactam (ZOSYN) 3.375 g in 0.9% sodium chloride (MBP/ADV) 100 mL MBP: INTRAVENOUS | @ 11:00:00 | NDC 00338055318

## 2009-05-11 MED ADMIN — piperacillin-tazobactam (ZOSYN) 3.375 g in 0.9% sodium chloride (MBP/ADV) 100 mL MBP: INTRAVENOUS | @ 06:00:00 | NDC 00338055318

## 2009-05-11 MED ADMIN — morphine injection 1 mg: INTRAVENOUS | @ 01:00:00 | NDC 00409176230

## 2009-05-11 MED ADMIN — dextrose 5% infusion: INTRAVENOUS | @ 14:00:00 | NDC 00409792209

## 2009-05-11 MED ADMIN — pantoprazole (PROTONIX) injection 40 mg: INTRAVENOUS | @ 14:00:00 | NDC 00409488810

## 2009-05-11 MED ADMIN — albuterol/ipratropium (DUONEB) neb solution: RESPIRATORY_TRACT | @ 02:00:00 | NDC 00487980101

## 2009-05-11 MED ADMIN — metoprolol tartrate (LOPRESSOR) injection 2.5 mg: INTRAVENOUS | @ 06:00:00 | NDC 00143987310

## 2009-05-11 MED ADMIN — vancomycin (VANCOCIN) 0.75 g in 0.9% sodium chloride 250 mL IVPB: INTRAVENOUS | @ 06:00:00 | NDC 63323031461

## 2009-05-11 MED ADMIN — albuterol/ipratropium (DUONEB) neb solution: RESPIRATORY_TRACT | @ 15:00:00 | NDC 00487980101

## 2009-05-11 MED ADMIN — piperacillin-tazobactam (ZOSYN) 3.375 g in 0.9% sodium chloride (MBP/ADV) 100 mL MBP: INTRAVENOUS | @ 01:00:00 | NDC 00338055318

## 2009-05-11 MED ADMIN — levofloxacin (LEVAQUIN) 750 mg infusion: INTRAVENOUS | @ 14:00:00 | NDC 50458016601

## 2009-05-11 MED ADMIN — aspirin chewable tablet 81 mg: ORAL | @ 14:00:00

## 2009-05-11 MED ADMIN — vancomycin (VANCOCIN) 0.75 g in 0.9% sodium chloride 250 mL IVPB: INTRAVENOUS | @ 19:00:00 | NDC 63323031461

## 2009-05-11 MED ADMIN — levothyroxine (SYNTHROID) injection 25 mcg: INTRAVENOUS | @ 14:00:00 | NDC 63323024710

## 2009-05-11 MED FILL — LOVENOX 30 MG/0.3 ML SUB-Q SYRINGE: 30 mg/0.3 mL | SUBCUTANEOUS | Qty: 1

## 2009-05-11 MED FILL — IPRATROPIUM BROMIDE 0.02 % SOLN FOR INHALATION: 0.02 % | RESPIRATORY_TRACT | Qty: 2.5

## 2009-05-11 MED FILL — THIAMINE 100 MG/ML INJECTION: 100 mg/mL | INTRAMUSCULAR | Qty: 1

## 2009-05-11 MED FILL — METOPROLOL TARTRATE 5 MG/5 ML IV SOLN: 5 mg/ mL | INTRAVENOUS | Qty: 5

## 2009-05-11 MED FILL — LACTULOSE 10 GRAM/15 ML ORAL SOLN: 10 gram/15 mL | ORAL | Qty: 30

## 2009-05-11 MED FILL — AMINOSYN II 10 % IV: 10 % | INTRAVENOUS | Qty: 685.1

## 2009-05-11 MED FILL — SALINE FLUSH INJECTION SYRINGE: INTRAMUSCULAR | Qty: 10

## 2009-05-11 MED FILL — LEVOTHYROXINE 200 MCG IJ SOLR: 200 mcg | INTRAVENOUS | Qty: 5

## 2009-05-11 MED FILL — MORPHINE 2 MG/ML INJECTION: 2 mg/mL | INTRAMUSCULAR | Qty: 1

## 2009-05-11 MED FILL — VANCOMYCIN 10 GRAM IV SOLR: 10 gram | INTRAVENOUS | Qty: 0.75

## 2009-05-11 MED FILL — ZOSYN 3.375 GRAM INTRAVENOUS SOLUTION: 3.375 gram | INTRAVENOUS | Qty: 3.38

## 2009-05-11 MED FILL — IPRATROPIUM BROMIDE 0.02 % SOLN FOR INHALATION: 0.02 % | RESPIRATORY_TRACT | Qty: 7.5

## 2009-05-11 MED FILL — SODIUM CHLORIDE 0.9 % INJECTION: INTRAMUSCULAR | Qty: 10

## 2009-05-11 MED FILL — LEVAQUIN 750 MG/150 ML IN 5 % DEXTROSE INTRAVENOUS PIGGYBACK: 750 mg/150 mL | INTRAVENOUS | Qty: 150

## 2009-05-11 MED FILL — ASPIRIN 81 MG CHEWABLE TAB: 81 mg | ORAL | Qty: 1

## 2009-05-11 NOTE — Progress Notes (Signed)
 Formatting of this note is different from the original.  Progress Note:  Follow-up for TF initiation.  TF not started yet; NGT pulled, placed again today 9/21; clamped.  Decision for PEG revoked.  Pt on TPN 4.25% a.a. 10% dextrose @ 17ml/hr.  TPN not meeting needs.  No new TPN orders.  Per MD progress note, waiting before restarting enteral feed pt c/o RUQ pain.      Recommendations:  1.Now that Propofol is d/c'd, recommended goal rate of TF is 40 ml/hr.  2.Strict aspiration precautions   3.HOB >30 degrees    Thank you,  Richardean Head, RD  Pager [phone]  Electronically signed by Head Richardean HERO, RD at 05/11/2009  2:16 PM EDT

## 2009-05-11 NOTE — Progress Notes (Signed)
 Formatting of this note is different from the original.  Neurology Progress Note    Patient ID:  Breanna Lester  [id number]  48 y.o.  31-Jan-1961    Subjective:     Patient none..    Current facility-administered medications   Medication Dose Route Frequency   ? lactulose (CHRONULAC) solution 20 g  20 g Oral BID   ? aspirin chewable tablet 81 mg  81 mg Oral DAILY   ? levothyroxine (SYNTHROID) injection 25 mcg  25 mcg IntraVENous DAILY   ? TPN ADULT - PERIPHERAL     IntraVENous CONTINUOUS   ? DISCONTD: TPN ADULT - PERIPHERAL     IntraVENous CONTINUOUS   ? levofloxacin (LEVAQUIN) 750 mg infusion   750 mg IntraVENous Q24H   ? vancomycin (VANCOCIN) 0.75 g in 0.9% sodium chloride  250 mL IVPB  750 mg IntraVENous Q12H   ? dextrose  5% infusion    IntraVENous CONTINUOUS   ? piperacillin-tazobactam (ZOSYN) 3.375 g in 0.9% sodium chloride  (MBP/ADV) 100 mL MBP  3.375 g IntraVENous Q6H   ? enoxaparin  (LOVENOX ) injection 70 mg  70 mg SubCUTAneous Q12H   ? albumin human 5% (BUMINATE) solution 25 g  25 g IntraVENous Q4H PRN   ? metoprolol  tartrate (LOPRESSOR ) injection 2.5 mg  2.5 mg IntraVENous Q6H   ? morphine  injection 1 mg  1 mg IntraVENous PRN   ? pantoprazole  (PROTONIX ) injection 40 mg  40 mg IntraVENous DAILY   ? albuterol /ipratropium (DUONEB ) neb solution   1 Dose Nebulization Q4HWA RT   ? midazolam (VERSED) injection 2-4 mg  2-4 mg IntraVENous Q1H PRN   ? sodium chloride  (NS) flush 10 mL  10 mL InterCATHeter Q8H   ? sodium chloride  (NS) flush 10 mL  10 mL InterCATHeter PRN   ? heparin (porcine) pf 300 Units  300 Units InterCATHeter PRN   ? sodium chloride  (NS) flush 20 mL  20 mL InterCATHeter PRN   ? acetaminophen  (TYLENOL ) suppository 650 mg  650 mg Rectal Q6H PRN   ? albuterol  (PROVENTIL  VENTOLIN ) nebulizer solution 2.5 mg  2.5 mg Nebulization Q2H PRN   ? thiamine (B-1) 100 mg in 0.9% sodium chloride  50 mL IVPB  100 mg IntraVENous DAILY   ? nitroglycerin (NITROBID) 2 % ointment 2 Inch  2 Inch Topical Q6H PRN   ? saline  peripheral flush 5 mL  5 mL InterCATHeter PRN         Review of Systems:    A comprehensive review of systems was negative except for: Neurological: positive for memory problems, speech problems, coordination problems, gait problems and weakness    Objective:     Patient Vitals in the past 8 hrs:   BP Temp Pulse Resp SpO2   05/11/09 0736 - - - - 97 %   05/11/09 0700 117/46 mmHg - 76  26  97 %   05/11/09 0600 112/45 mmHg - 81  19  99 %   05/11/09 0500 108/43 mmHg - 80  14  99 %   05/11/09 0400 95/48 mmHg 98.7 F (37.1 C) 78  20  99 %   05/11/09 0300 110/40 mmHg - 78  19  99 %   05/11/09 0200 107/40 mmHg - 77  25  99 %   05/11/09 0100 111/43 mmHg - 80  19  -     In: 100 (100 I.V.)  Out: -   In: 4660.1 (50 P.O. 4610.1 I.V.)  Out: 3200 (2100 Urine 600 Drains)  Lab Review Recent Results (from the past 24 hour(s))   CULTURE, FUNGUS, BLOOD    Collection Time    05/10/09  1:15 PM   Component Value Range   ? Specimen Description: BLOOD     ? Special Requests: HOLD 21 DAYS FOR FUNGUS     ? Culture result: NO GROWTH AFTER 14 HOURS     ? Report Status PENDING     GLUCOSE, POC    Collection Time    05/11/09 12:00 AM   Component Value Range   ? POC GLUCOSE 146 (*) 65 - 105 (mg/dL)   CBC WITH AUTOMATED DIFF    Collection Time    05/11/09  4:10 AM   Component Value Range   ? WBC PENDING  (K/uL)   ? RBC 3.07 (*) 3.80 - 5.20 (M/uL)   ? HGB 8.4 (*) 11.5 - 16.0 (g/dL)   ? HCT 27.7 (*) 35.0 - 47.0 (%)   ? MCV 90.2  80.0 - 99.0 (FL)   ? MCH 27.4  26.0 - 34.0 (PG)   ? MCHC 30.3  30.0 - 36.5 (g/dL)   ? RDW 20.3 (*) 11.5 - 14.5 (%)   ? PLATELET PENDING  (K/uL)   ? NEUTROPHILS PENDING  (%)   ? LYMPHOCYTES PENDING  (%)   ? MONOCYTES PENDING  (%)   ? EOSINOPHILS PENDING  (%)   ? BASOPHILS PENDING  (%)   ? ABSOLUTE NEUTS PENDING  (K/UL)   ? ABSOLUTE LYMPHS PENDING  (K/UL)   ? ABSOLUTE MONOS PENDING  (K/UL)   ? ABSOLUTE EOSINS PENDING  (K/UL)   ? ABSOLUTE BASOS PENDING  (K/UL)   ? DF PENDING     METABOLIC PANEL, COMPREHENSIVE    Collection  Time    05/11/09  4:10 AM   Component Value Range   ? Sodium 154 (*) 136 - 145 (MMOL/L)   ? Potassium 3.5  3.5 - 5.1 (MMOL/L)   ? Chloride 123 (*) 97 - 108 (MMOL/L)   ? CO2 21  21 - 32 (MMOL/L)   ? Anion gap 10  5 - 15 (mmol/L)   ? Glucose 131 (*) 65 - 100 (MG/DL)   ? BUN 14  6 - 20 (MG/DL)   ? Creatinine 0.8  0.6 - 1.3 (MG/DL)   ? BUN/Creatinine ratio 18  12 - 20 ( )   ? GFR est AA >60  >60 (ml/min/1.75m2)   ? GFR est non-AA >60  >60 (ml/min/1.72m2)   ? Calcium 8.7  8.5 - 10.1 (MG/DL)   ? Bilirubin, total 3.3 (*) 0.2 - 1.0 (MG/DL)   ? ALT 66  12 - 78 (U/L)   ? AST 39 (*) 15 - 37 (U/L)   ? Alk. phosphatase 113  50 - 136 (U/L)   ? Protein, total 5.7 (*) 6.4 - 8.2 (g/dL)   ? Albumin 2.0 (*) 3.5 - 5.0 (g/dL)   ? Globulin 3.7  2.0 - 4.0 (g/dL)   ? A-G Ratio 0.5 (*) 1.1 - 2.2 ( )   MAGNESIUM     Collection Time    05/11/09  4:10 AM   Component Value Range   ? Magnesium  2.0  1.6 - 2.4 (MG/DL)   GLUCOSE, POC    Collection Time    05/11/09  6:58 AM   Component Value Range   ? POC GLUCOSE 157 (*) 65 - 105 (mg/dL)     Additional comments:I reviewed the patient's other test results. labs  NEUROLOGICAL EXAM:    Appearance:  The patient is poorly developed and nourished, lethargic   Mental Status: Oriented to person. Trying to communicate and follows some commands. Mood and affect depressed.   Cranial Nerves:   Intact visual fields. Fundi are benign. PERLA, EOM's full, no nystagmus, no ptosis. Facial sensation is normal. Corneal reflexes are intact. Facial movement is symmetric. Hearing is normal bilaterally. Palate is midline with normal sternocleidomastoid and trapezius muscles are normal. Tongue is midline.   Motor:  4/5 strength in upper and lower proximal and distal muscles. Normal bulk and tone. No fasciculations.   Reflexes:   Deep tendon reflexes 1+/4 and symmetrical.   Sensory:   Normal to touch, pinprick    Gait:  Untestable gait.   Tremor:   No tremor noted.   Cerebellar:  No cerebellar signs present.   Neurovascular:   Normal heart sounds and regular rhythm, peripheral pulses intact, and no carotid bruits.     Assessment:     Patient Active Hospital Problem List:  Nausea with vomiting (05/01/2009)    Hypokalemia (05/01/2009)    Hepatic encephalopathy (05/01/2009)    Gait abnormality (05/01/2009)    Alcoholic cirrhosis of liver (05/01/2009)    Hypomagnesemia (05/01/2009)    Plan:   Encephalopathy improving  Will get PT and OT    Signed:  Debby A. Claudene, MD  05/11/2009  8:34 AM    Electronically signed by Claudene Debby LABOR, MD at 05/11/2009  8:41 AM EDT

## 2009-05-11 NOTE — Progress Notes (Signed)
 Formatting of this note might be different from the original.  08:00: Pt awake in bed watching tv; slow to respond but answers questions appropriately.   Pt often staring at the left corner of the room, hallucinating at times stating, they are over there.  Pt is however oriented to person, place and time.      09:30: Speech therapist, Dagoberto at bedside assessing pt.    10:00: Pt c/o discomfort in bed; repositioned.  Call bell placed within pt's reach.    10:30: Pt asleep in bed at this time.  Electronically signed by Mervyn Ulanda BROCKS, RN at 05/11/2009 10:41 AM EDT

## 2009-05-11 NOTE — Progress Notes (Signed)
 Formatting of this note is different from the original.  Cardiology Progress Note    05/11/2009 9:41 AM    Admit Date: 04/29/2009    Subjective:     No c/o.    BP 115/41  Pulse 85  Temp 98.1 F (36.7 C)  Resp 25  Ht 5' 2 (1.575 m)  Wt 144 lb 13.5 oz (65.7 kg)  SpO2 95%  PF 40 L/min  Current facility-administered medications   Medication Dose Route Frequency   ? lactulose (CHRONULAC) solution 20 g  20 g Oral BID   ? aspirin chewable tablet 81 mg  81 mg Oral DAILY   ? levothyroxine (SYNTHROID) injection 25 mcg  25 mcg IntraVENous DAILY   ? TPN ADULT - PERIPHERAL     IntraVENous CONTINUOUS   ? DISCONTD: TPN ADULT - PERIPHERAL     IntraVENous CONTINUOUS   ? levofloxacin (LEVAQUIN) 750 mg infusion   750 mg IntraVENous Q24H   ? vancomycin (VANCOCIN) 0.75 g in 0.9% sodium chloride  250 mL IVPB  750 mg IntraVENous Q12H   ? dextrose 5% infusion    IntraVENous CONTINUOUS   ? piperacillin-tazobactam (ZOSYN) 3.375 g in 0.9% sodium chloride  (MBP/ADV) 100 mL MBP  3.375 g IntraVENous Q6H   ? enoxaparin  (LOVENOX ) injection 70 mg  70 mg SubCUTAneous Q12H   ? albumin human 5% (BUMINATE) solution 25 g  25 g IntraVENous Q4H PRN   ? metoprolol  tartrate (LOPRESSOR ) injection 2.5 mg  2.5 mg IntraVENous Q6H   ? morphine  injection 1 mg  1 mg IntraVENous PRN   ? pantoprazole  (PROTONIX ) injection 40 mg  40 mg IntraVENous DAILY   ? albuterol /ipratropium (DUONEB ) neb solution   1 Dose Nebulization Q4HWA RT   ? midazolam (VERSED) injection 2-4 mg  2-4 mg IntraVENous Q1H PRN   ? sodium chloride  (NS) flush 10 mL  10 mL InterCATHeter Q8H   ? sodium chloride  (NS) flush 10 mL  10 mL InterCATHeter PRN   ? heparin (porcine) pf 300 Units  300 Units InterCATHeter PRN   ? sodium chloride  (NS) flush 20 mL  20 mL InterCATHeter PRN   ? acetaminophen  (TYLENOL ) suppository 650 mg  650 mg Rectal Q6H PRN   ? albuterol  (PROVENTIL  VENTOLIN ) nebulizer solution 2.5 mg  2.5 mg Nebulization Q2H PRN   ? thiamine (B-1) 100 mg in 0.9% sodium chloride  50 mL IVPB  100  mg IntraVENous DAILY   ? nitroglycerin (NITROBID) 2 % ointment 2 Inch  2 Inch Topical Q6H PRN   ? saline peripheral flush 5 mL  5 mL InterCATHeter PRN         Objective:     Physical Exam:    Chest--occasional rhonchi  CV--rrr Nl S1S2 I/VI sem  ABD--+ BS, non-tender  EXT--unchanged    Data Review:   Labs:  Recent Results (from the past 24 hour(s))   CULTURE, FUNGUS, BLOOD    Collection Time    05/10/09  1:15 PM   Component Value Range   ? Specimen Description: BLOOD     ? Special Requests: HOLD 21 DAYS FOR FUNGUS     ? Culture result: NO GROWTH AFTER 14 HOURS     ? Report Status PENDING     GLUCOSE, POC    Collection Time    05/11/09 12:00 AM   Component Value Range   ? POC GLUCOSE 146 (*) 65 - 105 (mg/dL)   CBC WITH AUTOMATED DIFF    Collection Time    05/11/09  4:10 AM   Component Value Range   ? WBC PENDING  (K/uL)   ? RBC 3.07 (*) 3.80 - 5.20 (M/uL)   ? HGB 8.4 (*) 11.5 - 16.0 (g/dL)   ? HCT 27.7 (*) 35.0 - 47.0 (%)   ? MCV 90.2  80.0 - 99.0 (FL)   ? MCH 27.4  26.0 - 34.0 (PG)   ? MCHC 30.3  30.0 - 36.5 (g/dL)   ? RDW 20.3 (*) 11.5 - 14.5 (%)   ? PLATELET PENDING  (K/uL)   ? NEUTROPHILS PENDING  (%)   ? LYMPHOCYTES PENDING  (%)   ? MONOCYTES PENDING  (%)   ? EOSINOPHILS PENDING  (%)   ? BASOPHILS PENDING  (%)   ? ABSOLUTE NEUTS PENDING  (K/UL)   ? ABSOLUTE LYMPHS PENDING  (K/UL)   ? ABSOLUTE MONOS PENDING  (K/UL)   ? ABSOLUTE EOSINS PENDING  (K/UL)   ? ABSOLUTE BASOS PENDING  (K/UL)   ? DF PENDING     METABOLIC PANEL, COMPREHENSIVE    Collection Time    05/11/09  4:10 AM   Component Value Range   ? Sodium 154 (*) 136 - 145 (MMOL/L)   ? Potassium 3.5  3.5 - 5.1 (MMOL/L)   ? Chloride 123 (*) 97 - 108 (MMOL/L)   ? CO2 21  21 - 32 (MMOL/L)   ? Anion gap 10  5 - 15 (mmol/L)   ? Glucose 131 (*) 65 - 100 (MG/DL)   ? BUN 14  6 - 20 (MG/DL)   ? Creatinine 0.8  0.6 - 1.3 (MG/DL)   ? BUN/Creatinine ratio 18  12 - 20 ( )   ? GFR est AA >60  >60 (ml/min/1.22m2)   ? GFR est non-AA >60  >60 (ml/min/1.63m2)   ? Calcium 8.7  8.5 -  10.1 (MG/DL)   ? Bilirubin, total 3.3 (*) 0.2 - 1.0 (MG/DL)   ? ALT 66  12 - 78 (U/L)   ? AST 39 (*) 15 - 37 (U/L)   ? Alk. phosphatase 113  50 - 136 (U/L)   ? Protein, total 5.7 (*) 6.4 - 8.2 (g/dL)   ? Albumin 2.0 (*) 3.5 - 5.0 (g/dL)   ? Globulin 3.7  2.0 - 4.0 (g/dL)   ? A-G Ratio 0.5 (*) 1.1 - 2.2 ( )   MAGNESIUM     Collection Time    05/11/09  4:10 AM   Component Value Range   ? Magnesium  2.0  1.6 - 2.4 (MG/DL)   GLUCOSE, POC    Collection Time    05/11/09  6:58 AM   Component Value Range   ? POC GLUCOSE 157 (*) 65 - 105 (mg/dL)     Telemetry: normal sinus rhythm    Assessment:     Patient Active Hospital Problem List:  Nausea with vomiting (05/01/2009)    Hypokalemia (05/01/2009)    Hepatic encephalopathy (05/01/2009)    Gait abnormality (05/01/2009)    Alcoholic cirrhosis of liver (05/01/2009)    Hypomagnesemia (05/01/2009)    Non ST elevation MI  Plan:     Continue supportive rx  ASA, lovenox , beta blocker  ?statin (held due to hepatic issues)  Eventual ischemic w/u after clears    Norleen MICAEL Gelineau, MD      Electronically signed by Gelineau Norleen ORN, MD at 05/11/2009  9:44 AM EDT

## 2009-05-11 NOTE — Progress Notes (Signed)
 Formatting of this note is different from the original.  Problem: Dysphagia (Adult)  Goal: *Acute Goals and Plan of Care (Insert Text)  Speech Therapy Goals  Initiated 05/10/09  1. Patient will participate in re-evaluation of swallow within 7 days  2. Patient will tolerate least restrictive diet without signs or symptoms of aspiration within 7 days  3. Patient will participate in MBS as needed/appropriate within 7 days   SPEECH LANGUAGE PATHOLOGY DYSPHAGIA TREATMENT    NAME : Breanna Lester  AGE:  48 y.o.  GENDER:  female  DATE:  05/11/2009  PRIMARY DIAGNOSIS:  AMS, hypokalemia, lft upper extremity pain  AMS, hypokalemia, lft upper extremity pain  AMS, hypokalemia, lft upper extremity pain <principal problem not specified>    SUBJECTIVE:   Patient stated that the ice was good.    OBJECTIVE:   Cognitive and Communication Status:  Level of Consciousness: awake but slow to respond  Orientation Level: Oriented to place;Oriented to person;Oriented to time  Cognition: Decreased attention/concentration;Follows commands  Perseveration: No perseveration noted  Safety/Judgement: Decreased awareness of need for assistance;Decreased awareness of need for safety    Dysphagia Treatment:  Oral Assessment:  Oral Assessment  Labial: No impairment  Dentition: Extractions  Oral Hygiene: thick tenacious oropharyngeal secretions suctioned  Lingual: No impairment  Velum: Flaccid  Gag Reflex: Present (with touch to posterior pharyngeal wall)    P.O. Trials:  Patient Position: Upright in bed    The patient was given the following:  Consistency Presented: Ice chips;Puree  How Presented: SLP-fed/presented;Spoon    ORAL PHASE:  Bolus Acceptance: No impairment  Bolus Formation/Control: Impaired  Propulsion: Absent  Type of Impairment:  (chewed ice slowly)  Oral Residue: Lingual (minimal )    PHARYNGEAL PHASE:  Initiation of Swallow: Delayed (# of seconds)4-5  Laryngeal Elevation: Decreased  Aspiration Signs/Symptoms: Weak cough  Vocal Quality:  Hoarse;Breathy (low volume)  Cues for Modifications: Moderate  Effective Modifications: None  Encouraged pt to cough and expectorate but she could not clear secretions into oropharynx for suctioning.    Pain:  Pt in no visible pain.    After treatment:  [X]    Patient left in NAD  [X]    Call bell left within reach  [ ]    Caregiver present  [ ]    Patient left up in chair, nursing staff notified    ASSESSMENT AND PROGRESSION TOWARD GOALS:   Pt s/p encephalopathy presents well oriented but with slow responses to questions. Some unintelligible verbal productions.Stronger voice today but still not WNL. Seemed confused. May be seeing people. Nsg present. Patient with moderate to severe oropharyngeal dysphagia with slow oral prep, delayed swallow with verbal cues needed 50% of the time to swallow, reduced hyolaryngeal elevation. Cough after ice chips. Vocal quality change after applesauce and delayed cough noted. Again pt's cough was not productive. AMS will affect po intake negatively when she is ready to start po.    Patient?s progression toward goals is as follows:  [ ]    Improving appropriately and progressing toward goals  [X]    Improving slowly and progressing toward goals  [ ]    Not making progress toward goals and plan of care will be adjusted    PLAN OF CARE:   Patient continues to benefit from skilled intervention to address the above impairments.    Recommendations and Planned Interventions:    Any new recommendations or planned interventions including recommended diet changes were discussed with:  [ ]   Patient     [ ]   Family      [X]    Nursing  [ ]    Provider  [ ]   Posted safety precautions in patient?s room    Communication/Collaboration:  The patient?s plan of care was discussed with:  Registered Nurse    Rudell LITTIE Marshall, SLP    Time Calculation: 19 mins          Electronically signed by Marshall Rudell LITTIE, SLP at 05/11/2009  9:59 AM EDT

## 2009-05-11 NOTE — Progress Notes (Signed)
 Formatting of this note might be different from the original.  Pt soiled in green stool.  Incontinence care given, linens and gown changed.  Pt repositioned in bed, denies pain, now falling asleep.  Call bell placed within pt's reach.   Electronically signed by Mervyn Ulanda BROCKS, RN at 05/11/2009  6:39 PM EDT

## 2009-05-11 NOTE — Progress Notes (Signed)
 Formatting of this note is different from the original.  PULMONARY/Critical Care/SLEEP MEDICINE - [facility]  Name: Breanna Lester   DOB: [birth year]   MRN: [id number]   Date: 05/11/2009 9:35 AM   [x]     I have reviewed the flowsheet and previous day?s notes.  []    The patient is unable to give any meaningful history or review of systems because the patient is:  []    Intubated []       []    Sedated    []    Unresponsive      []    The patient is critically ill on      []    Mechanical ventilation []    Pressors   []    BiPAP []           ROS:A comprehensive review of systems was negative except for: Musculoskeletal: positive for muscle weakness    Vital Signs:    BP 115/41  Pulse 85  Temp 98.1 F (36.7 C)  Resp 25  Ht 5' 2 (1.575 m)  Wt 144 lb 13.5 oz (65.7 kg)  SpO2 95%  PF 40 L/min    O2 Device: Nasal cannula   O2 Flow Rate (L/min): 2 l/min   Temp (24hrs), Avg:98.5 F (36.9 C), Min:98.1 F (36.7 C), Max:98.8 F (37.1 C)      Intake/Output:   Last shift:      In: 376 (376 I.V.)  Out: 155 (155 Urine)    Last 3 shifts: In: 4660.1 (50 P.O. 4610.1 I.V.)  Out: 3200 (2100 Urine 600 Drains)      Intake/Output Summary (Last 24 hours) at 05/11/09 0935  Last data filed at 05/11/09 0900   Gross per 24 hour   Intake 3441.05 ml   Output   1820 ml   Net 1621.05 ml     Hemodynamics:   PAP:     Wedge:     CVP:      CO:     CI:     SVR:     PVR:        Ventilator Settings:    Physical Exam:    General: []    Intubated/sedated []    No acute distress []        [x]    Ill appearing []     []          HEENT: []    Icteric [x]    PERRL []        [x]    Anicteric Mucosa:[x]    moist   []    dry []         Resp: []    Wheeze []    Clear to ascultation bilaterally []    Accessory muscle use    []    Rales []    Chest tube:  []    Air leak []        [x]    Ronchi []    Crepitus []       []    Coarse bilateral ventilator breath sounds     CV: [x]    Regular []    Tachycardia []        []    Irregular []    Bradycardic []        []    Murmur []    S3 []         []    Edema []    S4 []         GI: [x]    Soft  []    NG Tube Bowel sounds: []    present [x]    absent    []   Firm []    PEG []        []    Distended  []         GU: [x]    Foley []    Hematuria []        [x]    Clear urine []    Edema []         MSK:    []    SCD?s []    Edema []        [x]    No deformities []     []         Skin: [x]    Warm [x]    Dry  []        []    Cool []    Moist []        []    Hot  []    Diaphoretic []        []    Rash  []    Cyanosis []         N-psych: []    Sedated  []    Agitated []        [x]    Alert  Follows commands:   [x]    Yes  []    No []         Devices: []    ET-Tube []    Tracheostomy []    Chest tube: Air leak? []    Y/[]    N    []    [employer] []    PA Catheter []        []    PICC []     []         DATA:   Current facility-administered medications   Medication Dose Route Frequency   ? lactulose (CHRONULAC) solution 20 g  20 g Oral BID   ? aspirin chewable tablet 81 mg  81 mg Oral DAILY   ? levothyroxine (SYNTHROID) injection 25 mcg  25 mcg IntraVENous DAILY   ? TPN ADULT - PERIPHERAL     IntraVENous CONTINUOUS   ? DISCONTD: TPN ADULT - PERIPHERAL     IntraVENous CONTINUOUS   ? levofloxacin (LEVAQUIN) 750 mg infusion   750 mg IntraVENous Q24H   ? vancomycin (VANCOCIN) 0.75 g in 0.9% sodium chloride  250 mL IVPB  750 mg IntraVENous Q12H   ? dextrose 5% infusion    IntraVENous CONTINUOUS   ? piperacillin-tazobactam (ZOSYN) 3.375 g in 0.9% sodium chloride  (MBP/ADV) 100 mL MBP  3.375 g IntraVENous Q6H   ? enoxaparin  (LOVENOX ) injection 70 mg  70 mg SubCUTAneous Q12H   ? albumin human 5% (BUMINATE) solution 25 g  25 g IntraVENous Q4H PRN   ? metoprolol  tartrate (LOPRESSOR ) injection 2.5 mg  2.5 mg IntraVENous Q6H   ? morphine  injection 1 mg  1 mg IntraVENous PRN   ? pantoprazole  (PROTONIX ) injection 40 mg  40 mg IntraVENous DAILY   ? albuterol /ipratropium (DUONEB ) neb solution   1 Dose Nebulization Q4HWA RT   ? midazolam (VERSED) injection 2-4 mg  2-4 mg IntraVENous Q1H PRN   ? sodium chloride  (NS) flush 10 mL  10  mL InterCATHeter Q8H   ? sodium chloride  (NS) flush 10 mL  10 mL InterCATHeter PRN   ? heparin (porcine) pf 300 Units  300 Units InterCATHeter PRN   ? sodium chloride  (NS) flush 20 mL  20 mL InterCATHeter PRN   ? acetaminophen  (TYLENOL ) suppository 650 mg  650 mg Rectal Q6H PRN   ? albuterol  (PROVENTIL  VENTOLIN ) nebulizer solution 2.5 mg  2.5 mg Nebulization Q2H PRN   ? thiamine (B-1) 100 mg in 0.9% sodium chloride  50 mL IVPB  100 mg IntraVENous DAILY   ? nitroglycerin (NITROBID) 2 % ointment 2 Inch  2 Inch Topical Q6H PRN   ? saline peripheral flush 5 mL  5 mL InterCATHeter PRN     Telemetry: [x]    Sinus []    A-flutter []    Paced    []    A-fib []    Multiple PVC?s     Labs:  Recent Labs   Basename 05/11/09 0410 05/10/09 0550 05/09/09 0510   ? WBC PENDING 11.0 12.6*   ? HGB 8.4* 8.5* 9.0*   ? HCT 27.7* 28.0* 29.9*   ? PLT PENDING 67* 56*     Recent Labs   Basename 05/11/09 0410 05/10/09 0340 05/09/09 0510   ? NA 154* 149* 153*   ? K 3.5 3.5 3.8   ? CL 123* 118* 124*   ? CO2 21 22 22    ? GLU 131* 114* 120*   ? BUN 14 15 24*   ? CREA 0.8 0.8 0.9   ? CA 8.7 8.7 8.5   ? MG 2.0 1.9 2.4   ? PHOS -- -- --   ? ALB 2.0* 2.2* 2.2*   ? TBIL 3.3* 3.9* 4.1*   ? SGOT 39* 41* 52*   ? INR -- 1.7* --     Recent Labs   Basename 05/09/09 1844 05/09/09 1407 05/09/09 0445   ? PH 7.41 7.42 7.39   ? PCO2 37 30* 36   ? PO2 106* 134* 108*   ? HCO3 22 19* 21*   ? FIO2 40 40 40     Imaging:  [x]    I have personally reviewed the patient?s radiographs  []    Radiographs reviewed with radiologist   [x]    No change from prior, tubes and lines in adequate position  []    Improved   []    Worsening    IMPRESSION:     The patient is: [x]    acutely ill Risk of deterioration: []    moderate    []    critically ill  [x]    high     Patient Active Hospital Problem List:  Nausea with vomiting (05/01/2009)    Hypokalemia (05/01/2009)    Hepatic encephalopathy (05/01/2009)    Gait abnormality (05/01/2009)    Alcoholic cirrhosis of liver (05/01/2009)    Hypomagnesemia  (05/01/2009)         PLAN:  1. Wean O2  2. Cont iv abx  3. Diurese PRN  4. PUD/DVT prophylaxis  5. PO as tol  6. Wean HAL???  7. Needs free H2O     [x]    See my orders for details    My assessment/plan was discussed with:  [x]    nursing []    PT/OT    []    respiratory therapy []    Dr.   Lisbeth    family []         []    Total critical care time exclusive of procedures       minutes  Catalina FABIENE Sanders, MD      Electronically signed by Sanders Catalina SAUNDERS, MD at 05/11/2009  9:38 AM EDT

## 2009-05-11 NOTE — Progress Notes (Signed)
 Formatting of this note is different from the original.  Problem: Self Care Deficits Care Plan (Adult)  Goal: *Acute Goals and Plan of Care (Insert Text)  Occupational Therapy Goals Initiated 05/11/09    1. Pt will complete grooming in supported position with minimal assistance within 7 days.  2. Pt will increase fine motor skills to open task containers (toothpaste, lotion, ect) with set up assist within 7 days.  3. When appropriate, transfer training in preparation for functional mobility with be initiated and appropriate goal will be set.  4. Pt will tolerated x 10 min edge of bed with minimal assistance while completing dynamic balance activity within 7 days.  5. Pt will move from supine to sit with minimal assistance within 7 days, in preparation for functional mobility.  OCCUPATIONAL THERAPY EVALUATION    NAME: Breanna Lester AGE: 48 y.o.  GENDER: female  DATE: 05/11/2009  PRIMARY DIAGNOSIS AND MEDICAL HISTORY: AMS, hypokalemia, lft upper extremity pain  AMS, hypokalemia, lft upper extremity pain  AMS, hypokalemia, lft upper extremity pain  <principal problem not specified>    Past Medical History   Diagnosis Date   ? Liver disease 2009       liver failure   ? Depression     ? Anxiety       Past Surgical History   Procedure Date   ? Hx tonsillectomy 1972   ? Hx adenoidectomy 1972   ? Hx cesarean section 1986 and 1993   ? Hx hysterectomy 2008   ? Hx dilation and curettage 1999     Problem List Never Reviewed       Class     Nausea with vomiting [787.01]         Hypokalemia [276.8A]         Hepatic encephalopathy [572.2]         Gait abnormality [781.2N]         Alcoholic cirrhosis of liver [571.2]         Hypomagnesemia [275.2B]           Prior Level of Function/Home Situation: Pt used single point cane prior to admission.  Was otherwise Independent with ADLs.  No longer drives 2  decreased visual acuity.  Home Environment: Private residence  # Steps to Enter: 4  One/Two Story Residence: Two story (bedroom on  second level)  Living Alone: No (lives with husband and two adult daughters)  Support Systems: Family member(s)  Patient Expects to be Discharged to:: Rehabilitation facility  Current DME Used/Available at Home: Cane, straight   [X]          Right hand dominant            [ ]           Left hand dominant    Precautions: Standard, contact    SUBJECTIVE:   Patient stated ? I'm a little nervous, but lets try this .?    OBJECTIVE DATA SUMMARY:   Cognitive/Behavioral Status:  Level of Consciousness: Drowsy;Eyes open spontaneously  Orientation Level: Oriented to person;Oriented to place;Oriented to time  Cognition: Decreased attention/concentration;Follows commands (slow processing, with time answers questions appropriately)  Safety/Judgement: Not assessed  Skin: skin breakdown around rectum, nursing aware per documentation  Edema:  None noted  Vision/Perceptual:    Poor visual accuity, near and far vision  Coordination:  Fine Motor Skills-Upper: Comment (L UE in cast, gross grasp of objects w/ R hand )    Gross Motor Skills-Upper: Comment (Not assessed, limited antigravity  movement 2/2 weakness)  Balance:  Sitting: Impaired;With support  Sitting - Static: Other (comment) (Decreased trunk strength CGA-min EOB after positioning)  Sitting - Dynamic: Poor (constant support)  Standing:  (not assessed 2/2 safety concerns)  Strength:  Significant generalized weakness in all 4 extremies. Weak grasp intact and palpable muscle activation in B UE.  Limited anti-gravity ROM in B LE, WFL in gravity eliminated position.  Tone & Sensation:  WDL  Range of Motion:  PROM WFL, limited AROM 2  weakness  Functional Mobility and Transfers for ADLs:  Bed Mobility:  Overall level of assistance required: Max assist of 1  Transfers:  Sit to Stand:  (not tested)  Bed to Chair:  (not tested)              Toilet Transfer :  (flexi-seal and foley in place)    ADL Assessment:  Feeding: Other (comment) (NPO)    Oral Facial Hygiene/Grooming: Total  assistance    Bathing: Total assistance    Upper Body Dressing: Total assistance    Lower Body Dressing: Total assistance    Toileting: Total assistance    Cognitive Retraining  Safety/Judgement: Not assessed     Therapeutic Exercise:  Pt sat EOB x 12 min.  After positioning, pt sat with CGA with occaisonal min assist. Encouraged keeping head in neutral position, tendency to drop forward 2  weakness. Pt participated in light trunk exercises to increase strength and dynamic balance.  Light trunk flexion and rotation, and then returning to neutral position.  Pt with activity tolerance.     Pain:  Pain Scale: Numeric (0 - 10)  Pain Intensity: 0  Activity Tolerance:  Vitals Assessment 1:  Patient Position : Pre-activity;Supine  BP: 114/39 mmHg  Pulse (Heart Rate): 78   O2 Sat (%): 97 %  O2 Device: Nasal cannula  O2 Flow Rate (L/min): 2 l/min    Vitals Assessment 2:  Patient Position : During activity;Sitting  BP 2: 108/52 mmHg  O2 Sat (%):  O2 Device:  O2 Flow Rate (L/min):     After treatment:  [X]          Patient left in no apparent distress  [X]          Call bell left within reach  [ ]          Caregiver present  [X]          Patient left up in chair, nursing staff notified    ASSESSMENT:   Based on the objective data described above, the patient presents with severe generalized weakness in trunk and extremities, limited endurance, minimal antigravity movement, poor balance, and slow processing.  Pt drowsy, but was engaged during treatment.  Appropriately responded to questions, and followed commands.  Anticipate pt will make good progress 2  age and prior level of function.  Will benefit from OT address above with ADLs and functional mobility.    Patient will benefit from skilled intervention to address the above impairments.  Patient?s rehabilitation potential is considered to be Good  Factors which may influence rehabilitation potential include:  [ ]          None noted  [ ]          Mental ability/status  [X]           Medical condition  [ ]          Home/family situation and support systems  [ ]          Safety awareness  [ ]   Pain tolerance/management  [ ]          Other:    PLAN OF CARE:   Recommendations and Planned Interventions:  [X]          Self Care Training              [X]          Therapeutic Activities  [X]          Functional Mobility Training           [ ]          Cognitive Retraining  [X]          Therapeutic Exercises        [X]          Endurance Activities  [X]          Balance Training                 [X]          Neuromuscular Re-education  [X]          Patient Education                [ ]          Family Training/Education  [ ]          Visual/Perceptual Training   [X]          Home safety training  [ ]          Other (Comment):  Frequency/Duration:  Patient will be followed by occupational therapy 4-5 times a week  to address goals.  Discharge Recommendations:  With good progress, pt will likely be good candidate for inpatient rehab by time of discharge.  [X]          To be determined               [ ]          Day rehabilitation program  [ ]          Home without services  (home safety education provided)  [ ]          Home with Home Health services  (home safety education provided)  [ ]         Outpatient therapy                [ ]          Nursing facility for rehab  [X]          Inpatient Rehab Hospital    [ ]          Nursing facility for long term care  [ ]          Other:  Further Equipment Recommendations for Discharge: To be determined by progress  Communication/Collaboration:  [X]          Patient/family have participated as able in goal setting and plan of care.  [X]          Patient/family agree to work toward stated goals and plan of care.  [ ]          Patient understands intent and goals of therapy, but is neutral about his/her participation.  [ ]          Patient is unable to participate in goal setting and plan of care.  The patient?s plan of care was discussed with:  Occupational Therapist  This patient?s  plan of care  is  appropriate for delegation to OTA.    Thank you for this referral.  Delon LITTIE Blunt, OTR/L  Time Calculation: 40 mins  Electronically signed by Marsa Delon CROME, OTR/L at 05/11/2009  2:19 PM EDT

## 2009-05-11 NOTE — Progress Notes (Signed)
 Formatting of this note might be different from the original.  16:00: Assessment complete, VSS.  Pt resting in bed quietly.  Call bell within reach.   Electronically signed by Mervyn Ulanda BROCKS, RN at 05/11/2009  5:18 PM EDT

## 2009-05-11 NOTE — Progress Notes (Signed)
 Formatting of this note is different from the original.       Hospitalist Progress Note         NAME: Breanna Lester        DOB:  10/17/1960        MRN:  [id number]      Assessment & Plan   Sepsis syndrome :underlying pneumonia, fever, leukocytosis  Broad spectrum antibiotics.  No evidence of UTI or ascites(SBP), has an umbilical hernia that is not     reducible but overall adbomen is soft, has RUQ pain, CT gallbladder sludge and thickenned wall, no fever 24h    Acute respiratory failure:got extubated doing well off the ventilator    Altered mental status : multifactorial Sepsis, hypernatremia, ARF, hyperamonemia   has improved AMS, trying to communicate now     Hypernatremia: improving    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    Gait abnormality (05/01/2009) B12, folate normal, PT/OT when MS improved    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     Review of Systems:  Fever/chills y   Cough N   Sputum    N   SOB/DOE  N   Chest Pain N   Abdominal Pain y   Diarrhea N   Constipation N   Nausea/Vomiting N   Dysuria N   Tolerating PT N   Tolerating Diet N   Could not obtain due to AMS vs intubation        Objective:   Patient extubated yesterday, critically ill, sedation was D/C more awake today , follow commands,  decision for PEG was revoked patient on TPN, I will await before restarting enteral feed patient c/o RUQ pain    VITALS:   Last 24hrs VS reviewed since prior progress note. Most recent are:  Visit Vitals   Item Reading   ? BP 117/46   ? Pulse 76   ? Temp 98.7 F (37.1 C)   ? Resp 26   ? Ht 5' 2 (1.575 m)   ? Wt 144 lb 13.5 oz (65.7 kg)   ? SpO2 97%   ? PF 40L/min     Intake/Output Summary (Last 24 hours) at 05/11/09 0719  Last data filed at 05/11/09 0706   Gross per 24 hour   Intake 3465.05 ml   Output   1740 ml   Net 1725.05 ml       Telemetry Reviewed:     PHYSICAL EXAM:  General: Extubated, awake ,   HEENT: Pupils unequal, R 3mm, L 2mm  Lungs:  Bilateral rales, ronchi   Heart:  Regular rhythm, tachycardic, SEM  Abdomen: Soft, Non distended, hypoactive BS,RUQ pain,  no rebound, umbilical hernia non reducible,  Extremities: No edema, pulses present, brace in left arm  Neurologic: GCS [id number], no gross deficit  Psych: AMS    Lab Data Reviewed: (see below)    Medications Reviewed: (see below)    PMH/SH reviewed - no change compared to H&P  ______________________________________________________________________  Total time spent with patient: 45 minutes     Critical Care Provided     Minutes non procedure based    Care Plan discussed with:  Patient x   Family    RN x   Care Manager    Consultant/Specialist      >50% of visit spent in counseling and coordination of care       Prophylaxis:  GI PPI   DVT lovenox      Disposition:   Home with Family    HH/PT/OT/RN    SNF/LTC x   [facility]      ______________________________________________________________________  Attending Physician: Eric DOROTHA Deutscher, MD   _____________________________________________________________________________________________________  Procedures: see electronic medical records for all procedures/Xrays and details  which were not copied into this note but were reviewed prior to creation of Plan.      LABS:  Recent Labs   Basename 05/10/09 0550 05/09/09  0510   ? WBC 11.0 12.6*   ? HGB 8.5* 9.0*   ? HCT 28.0* 29.9*   ? PLT 67* 56*     Recent Labs   Basename 05/10/09 0340 05/09/09 0510   ? NA 149* 153*   ? K 3.5 3.8   ? CL 118* 124*   ? CO2 22 22   ? BUN 15 24*   ? CREA 0.8 0.9   ? GLU 114* 120*   ? CA 8.7 8.5   ? MG 1.9 2.4   ? PHOS -- --   ? URICA -- --     Recent Labs   Basename 05/10/09 0340 05/09/09 0510   ? SGOT 41* 52*   ? GPT 71 82*   ? AP 112 110   ? TBIL 3.9* 4.1*   ? TP 5.6* 5.7*   ? ALB 2.2* 2.2*   ? GLOB 3.4 3.5   ? GGT -- --   ? AML -- --   ? LPSE -- --     Recent Labs   Basename 05/10/09 0340   ? INR 1.7*   ? PTP 16.7*   ? APTT --         No results found for this basename: FE:2,TIBC:2,PSAT:2,FERR:2, in the last 72 hours   Recent Labs   Basename 05/09/09 1844 05/09/09 1407   ? PH 7.41 7.42   ? PCO2 37 30*   ? PO2 106* 134*     Recent Labs   Basename 05/09/09 0510   ? CPK 229*   ? CKMB --     Lab Results   Component Value Date/Time    POC GLUCOSE 146 05/11/2009 12:00 AM    POC GLUCOSE 114 05/01/2009  4:40 PM    POC GLUCOSE 162 05/01/2009 12:02 PM    POC GLUCOSE 115 05/01/2009  8:39 AM     Lab Results   Component Value Date/Time    Color DARK YELLOW 05/09/2009 11:05 AM    Appearance CLEAR 05/09/2009 11:05 AM    Specific gravity 1.010 10/29/2008 11:00 AM    Specific gravity 1.024 05/09/2009 11:05 AM    pH 6.0 05/09/2009 11:05 AM    Protein TRACE 05/09/2009 11:05 AM    Glucose NEGATIVE  05/09/2009 11:05 AM    Ketone NEGATIVE  05/09/2009 11:05 AM  Bilirubin NEGATIVE  04/30/2009 12:14 AM    Urobilinogen 0.2 05/09/2009 11:05 AM    Nitrites NEGATIVE  05/09/2009 11:05 AM    Leukocyte Esterase SMALL 05/09/2009 11:05 AM    Epithelial cells 0-5 05/09/2009 11:05 AM    Bacteria NEGATIVE  05/09/2009 11:05 AM    WBC 5-10 05/09/2009 11:05 AM    RBC 10-20 05/09/2009 11:05 AM     MEDICATIONS:  Current facility-administered medications   Medication Dose Route Frequency   ? lactulose (CHRONULAC) solution 20 g  20 g Oral BID   ? aspirin chewable tablet 81 mg  81 mg Oral DAILY   ? levothyroxine  (SYNTHROID) injection 25 mcg  25 mcg IntraVENous DAILY   ? TPN ADULT - PERIPHERAL     IntraVENous CONTINUOUS   ? DISCONTD: TPN ADULT - PERIPHERAL     IntraVENous CONTINUOUS   ? levofloxacin (LEVAQUIN) 750 mg infusion   750 mg IntraVENous Q24H   ? vancomycin (VANCOCIN) 0.75 g in 0.9% sodium chloride  250 mL IVPB  750 mg IntraVENous Q12H   ? dextrose 5% infusion    IntraVENous CONTINUOUS   ? piperacillin-tazobactam (ZOSYN) 3.375 g in 0.9% sodium chloride  (MBP/ADV) 100 mL MBP  3.375 g IntraVENous Q6H   ? enoxaparin  (LOVENOX ) injection 70 mg  70 mg SubCUTAneous Q12H   ? DISCONTD: levothyroxine (SYNTHROID) tablet 50 mcg  50 mcg Oral ACB   ? albumin human 5% (BUMINATE) solution 25 g  25 g IntraVENous Q4H PRN   ? metoprolol  tartrate (LOPRESSOR ) injection 2.5 mg  2.5 mg IntraVENous Q6H   ? DISCONTD: lactulose (CHRONULAC) solution 20 g  20 g Oral BID   ? DISCONTD: aspirin (ASPIRIN) tablet 325 mg  325 mg Oral DAILY   ? morphine  injection 1 mg  1 mg IntraVENous PRN   ? pantoprazole  (PROTONIX ) injection 40 mg  40 mg IntraVENous DAILY   ? albuterol /ipratropium (DUONEB ) neb solution   1 Dose Nebulization Q4HWA RT   ? midazolam (VERSED) injection 2-4 mg  2-4 mg IntraVENous Q1H PRN   ? sodium chloride  (NS) flush 10 mL  10 mL InterCATHeter Q8H   ? sodium chloride  (NS) flush 10 mL  10 mL InterCATHeter PRN   ? heparin (porcine) pf 300 Units  300 Units InterCATHeter PRN   ? sodium chloride  (NS) flush 20 mL  20 mL InterCATHeter PRN   ? acetaminophen  (TYLENOL ) suppository 650 mg  650 mg Rectal Q6H PRN   ? albuterol  (PROVENTIL  VENTOLIN ) nebulizer solution 2.5 mg  2.5 mg Nebulization Q2H PRN   ? thiamine (B-1) 100 mg in 0.9% sodium chloride  50 mL IVPB  100 mg IntraVENous DAILY   ? nitroglycerin (NITROBID) 2 % ointment 2 Inch  2 Inch Topical Q6H PRN   ? saline peripheral flush 5 mL  5 mL InterCATHeter PRN       Electronically signed by Andris Eric PARAS, MD at 05/11/2009  7:27 AM EDT

## 2009-05-11 NOTE — Progress Notes (Signed)
Pt soiled in green stool.  Incontinence care given, linens and gown changed.  Pt repositioned in bed, denies pain, now falling asleep.  Call bell placed within pt's reach.

## 2009-05-11 NOTE — Progress Notes (Signed)
16:00: Assessment complete, VSS.  Pt resting in bed quietly.  Call bell within reach.

## 2009-05-11 NOTE — Progress Notes (Signed)
08:00: Pt awake in bed watching tv; slow to respond but answers questions appropriately.   Pt often staring at the left corner of the room, hallucinating at times stating, "they are over there."  Pt is however oriented to person, place and time.      09:30: Speech therapist, Darel Hong at bedside assessing pt.    10:00: Pt c/o discomfort in bed; repositioned.  Call bell placed within pt's reach.    10:30: Pt asleep in bed at this time.

## 2009-05-11 NOTE — Progress Notes (Signed)
Problem: Dysphagia (Adult)  Goal: *Acute Goals and Plan of Care (Insert Text)  Speech Therapy Goals  Initiated 05/10/09  1. Patient will participate in re-evaluation of swallow within 7 days  2. Patient will tolerate least restrictive diet without signs or symptoms of aspiration within 7 days  3. Patient will participate in MBS as needed/appropriate within 7 days   SPEECH LANGUAGE PATHOLOGY DYSPHAGIA TREATMENT    NAME : Breanna Lester  AGE:  48 y.o.  GENDER:  female  DATE:  05/11/2009  PRIMARY DIAGNOSIS:  AMS, hypokalemia, lft upper extremity pain  AMS, hypokalemia, lft upper extremity pain  AMS, hypokalemia, lft upper extremity pain <principal problem not specified>      SUBJECTIVE:   Patient stated that the ice was good.      OBJECTIVE:   Cognitive and Communication Status:  Level of Consciousness: awake but slow to respond  Orientation Level: Oriented to place;Oriented to person;Oriented to time  Cognition: Decreased attention/concentration;Follows commands  Perseveration: No perseveration noted  Safety/Judgement: Decreased awareness of need for assistance;Decreased awareness of need for safety    Dysphagia Treatment:  Oral Assessment:  Oral Assessment  Labial: No impairment  Dentition: Extractions  Oral Hygiene: thick tenacious oropharyngeal secretions suctioned  Lingual: No impairment  Velum: Flaccid  Gag Reflex: Present (with touch to posterior pharyngeal wall)    P.O. Trials:  Patient Position: Upright in bed    The patient was given the following:  Consistency Presented: Ice chips;Puree  How Presented: SLP-fed/presented;Spoon    ORAL PHASE:  Bolus Acceptance: No impairment  Bolus Formation/Control: Impaired  Propulsion: Absent  Type of Impairment:  (chewed ice slowly)  Oral Residue: Lingual (minimal )    PHARYNGEAL PHASE:  Initiation of Swallow: Delayed (# of seconds)4-5  Laryngeal Elevation: Decreased  Aspiration Signs/Symptoms: Weak cough  Vocal Quality: Hoarse;Breathy (low volume)   Cues for Modifications: Moderate  Effective Modifications: None  Encouraged pt to cough and expectorate but she could not clear secretions into oropharynx for suctioning.    Pain:  Pt in no visible pain.    After treatment:  [X]    Patient left in NAD  [X]    Call bell left within reach  [ ]    Caregiver present  [ ]    Patient left up in chair, nursing staff notified      ASSESSMENT AND PROGRESSION TOWARD GOALS:   Pt s/p encephalopathy presents well oriented but with slow responses to questions. Some unintelligible verbal productions.Stronger voice today but still not WNL. Seemed confused. May be seeing people. Nsg present. Patient with moderate to severe oropharyngeal dysphagia with slow oral prep, delayed swallow with verbal cues needed 50% of the time to swallow, reduced hyolaryngeal elevation. Cough after ice chips. Vocal quality change after applesauce and delayed cough noted. Again pt's cough was not productive. AMS will affect po intake negatively when she is ready to start po.    Patient???s progression toward goals is as follows:  [ ]    Improving appropriately and progressing toward goals  [X]    Improving slowly and progressing toward goals  [ ]    Not making progress toward goals and plan of care will be adjusted      PLAN OF CARE:   Patient continues to benefit from skilled intervention to address the above impairments.    Recommendations and Planned Interventions:    Any new recommendations or planned interventions including recommended diet changes were discussed with:  [ ]   Patient     [ ]   Family      [X]    Nursing  [ ]    Provider  [ ]   Posted safety precautions in patient???s room    Communication/Collaboration:  The patient???s plan of care was discussed with:  Registered Nurse    Dennie Fetters, SLP    Time Calculation: 19 mins

## 2009-05-11 NOTE — Progress Notes (Signed)
PULMONARY/Critical Care/SLEEP MEDICINE ??? [facility]  Name: Thy Gullikson   DOB: 1961-02-01   MRN: [id number]   Date: 05/11/2009 9:35 AM   [x]     I have reviewed the flowsheet and previous day???s notes.  []    The patient is unable to give any meaningful history or review of systems because the patient is:  []    Intubated []       []    Sedated    []    Unresponsive      []    The patient is critically ill on      []    Mechanical ventilation []    Pressors   []    BiPAP []                     ROS:A comprehensive review of systems was negative except for: Musculoskeletal: positive for muscle weakness    Vital Signs:    BP 115/41   Pulse 85   Temp 98.1 ??F (36.7 ??C)   Resp 25   Ht 5\' 2"  (1.575 m)   Wt 144 lb 13.5 oz (65.7 kg)   SpO2 95%   PF 40 L/min    O2 Device: Nasal cannula   O2 Flow Rate (L/min): 2 l/min   Temp (24hrs), Avg:98.5 ??F (36.9 ??C), Min:98.1 ??F (36.7 ??C), Max:98.8 ??F (37.1 ??C)       Intake/Output:   Last shift:      In: 376 (376 I.V.)  Out: 155 (155 Urine)    Last 3 shifts: In: 4660.1 (50 P.O. 4610.1 I.V.)  Out: 3200 (2100 Urine 600 Drains)      Intake/Output Summary (Last 24 hours) at 05/11/09 0935  Last data filed at 05/11/09 0900   Gross per 24 hour   Intake 3441.05 ml   Output   1820 ml   Net 1621.05 ml       Hemodynamics:   PAP:     Wedge:     CVP:      CO:     CI:     SVR:     PVR:        Ventilator Settings:    Physical Exam:    General: []    Intubated/sedated []    No acute distress []        [x]    Ill appearing []     []           HEENT: []    Icteric [x]    PERRL []        [x]    Anicteric Mucosa:[x]    moist   []    dry []          Resp: []    Wheeze []    Clear to ascultation bilaterally []    Accessory muscle use    []    Rales []    Chest tube:  []    Air leak []        [x]    Ronchi []    Crepitus []       []    Coarse bilateral ventilator breath sounds      CV: [x]    Regular []    Tachycardia []        []    Irregular []    Bradycardic []        []    Murmur []    S3 []        []    Edema []    S4 []           GI: [x]    Soft  []    NG Tube Bowel  sounds: []    present [x]    absent    []    Firm []    PEG []        []    Distended  []          GU: [x]    Foley []    Hematuria []        [x]    Clear urine []    Edema []          MSK:    []    SCD???s []    Edema []        [x]    No deformities []     []          Skin: [x]    Warm [x]    Dry  []        []    Cool []    Moist []        []    Hot  []    Diaphoretic []        []    Rash  []    Cyanosis []          N-psych: []    Sedated  []    Agitated []        [x]    Alert  Follows commands:   [x]    Yes  []    No []          Devices: []    ET-Tube []    Tracheostomy []    Chest tube: Air leak? []    Y/[]    N    []    Central Line []    PA Catheter []        []    PICC []     []          DATA:   Current facility-administered medications   Medication Dose Route Frequency   ??? lactulose (CHRONULAC) solution 20 g  20 g Oral BID   ??? aspirin chewable tablet 81 mg  81 mg Oral DAILY   ??? levothyroxine (SYNTHROID) injection 25 mcg  25 mcg IntraVENous DAILY   ??? TPN ADULT - PERIPHERAL     IntraVENous CONTINUOUS   ??? DISCONTD: TPN ADULT - PERIPHERAL     IntraVENous CONTINUOUS   ??? levofloxacin (LEVAQUIN) 750 mg infusion   750 mg IntraVENous Q24H   ??? vancomycin (VANCOCIN) 0.75 g in 0.9% sodium chloride 250 mL IVPB  750 mg IntraVENous Q12H   ??? dextrose 5% infusion    IntraVENous CONTINUOUS   ??? piperacillin-tazobactam (ZOSYN) 3.375 g in 0.9% sodium chloride (MBP/ADV) 100 mL MBP  3.375 g IntraVENous Q6H   ??? enoxaparin (LOVENOX) injection 70 mg  70 mg SubCUTAneous Q12H   ??? albumin human 5% (BUMINATE) solution 25 g  25 g IntraVENous Q4H PRN   ??? metoprolol tartrate (LOPRESSOR) injection 2.5 mg  2.5 mg IntraVENous Q6H   ??? morphine injection 1 mg  1 mg IntraVENous PRN   ??? pantoprazole (PROTONIX) injection 40 mg  40 mg IntraVENous DAILY   ??? albuterol/ipratropium (DUONEB) neb solution   1 Dose Nebulization Q4HWA RT   ??? midazolam (VERSED) injection 2-4 mg  2-4 mg IntraVENous Q1H PRN   ??? sodium chloride (NS) flush 10 mL  10 mL InterCATHeter Q8H    ??? sodium chloride (NS) flush 10 mL  10 mL InterCATHeter PRN   ??? heparin (porcine) pf 300 Units  300 Units InterCATHeter PRN   ??? sodium chloride (NS) flush 20 mL  20 mL InterCATHeter PRN   ??? acetaminophen (TYLENOL) suppository 650 mg  650 mg Rectal Q6H PRN   ??? albuterol (PROVENTIL VENTOLIN) nebulizer solution 2.5 mg  2.5 mg Nebulization Q2H PRN   ??? thiamine (B-1) 100 mg in 0.9% sodium chloride 50 mL IVPB  100 mg IntraVENous DAILY   ??? nitroglycerin (NITROBID) 2 % ointment 2 Inch  2 Inch Topical Q6H PRN   ??? saline peripheral flush 5 mL  5 mL InterCATHeter PRN         Telemetry: [x]    Sinus []    A-flutter []    Paced    []    A-fib []    Multiple PVC???s                  Labs:  Recent Labs   Basename 05/11/09 0410 05/10/09 0550 05/09/09 0510   ??? WBC PENDING 11.0 12.6*   ??? HGB 8.4* 8.5* 9.0*   ??? HCT 27.7* 28.0* 29.9*   ??? PLT PENDING 67* 56*       Recent Labs   Basename 05/11/09 0410 05/10/09 0340 05/09/09 0510   ??? NA 154* 149* 153*   ??? K 3.5 3.5 3.8   ??? CL 123* 118* 124*   ??? CO2 21 22 22    ??? GLU 131* 114* 120*   ??? BUN 14 15 24*   ??? CREA 0.8 0.8 0.9   ??? CA 8.7 8.7 8.5   ??? MG 2.0 1.9 2.4   ??? PHOS -- -- --   ??? ALB 2.0* 2.2* 2.2*   ??? TBIL 3.3* 3.9* 4.1*   ??? SGOT 39* 41* 52*   ??? INR -- 1.7* --       Recent Labs   Basename 05/09/09 1844 05/09/09 1407 05/09/09 0445   ??? PH 7.41 7.42 7.39   ??? PCO2 37 30* 36   ??? PO2 106* 134* 108*   ??? HCO3 22 19* 21*   ??? 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     PLAN:  1. Wean O2  2. Cont iv abx  3. Diurese PRN  4. PUD/DVT prophylaxis  5. PO as tol  6. Wean HAL???  7. Needs free H2O     [x]    See my orders for details    My assessment/plan was discussed with:  [x]    nursing []    PT/OT    []    respiratory therapy []    Dr.   []    family []         []    Total critical care time exclusive of procedures       minutes  Vickki Muff, MD

## 2009-05-11 NOTE — Progress Notes (Signed)
Neurology Progress Note    Patient ID:  Breanna Lester  [id number]  48 y.o.  October 23, 1960    Subjective:      Patient none..    Current facility-administered medications   Medication Dose Route Frequency   ??? lactulose (CHRONULAC) solution 20 g  20 g Oral BID   ??? aspirin chewable tablet 81 mg  81 mg Oral DAILY   ??? levothyroxine (SYNTHROID) injection 25 mcg  25 mcg IntraVENous DAILY   ??? TPN ADULT - PERIPHERAL     IntraVENous CONTINUOUS   ??? DISCONTD: TPN ADULT - PERIPHERAL     IntraVENous CONTINUOUS   ??? levofloxacin (LEVAQUIN) 750 mg infusion   750 mg IntraVENous Q24H   ??? vancomycin (VANCOCIN) 0.75 g in 0.9% sodium chloride 250 mL IVPB  750 mg IntraVENous Q12H   ??? dextrose 5% infusion    IntraVENous CONTINUOUS   ??? piperacillin-tazobactam (ZOSYN) 3.375 g in 0.9% sodium chloride (MBP/ADV) 100 mL MBP  3.375 g IntraVENous Q6H   ??? enoxaparin (LOVENOX) injection 70 mg  70 mg SubCUTAneous Q12H   ??? albumin human 5% (BUMINATE) solution 25 g  25 g IntraVENous Q4H PRN   ??? metoprolol tartrate (LOPRESSOR) injection 2.5 mg  2.5 mg IntraVENous Q6H   ??? morphine injection 1 mg  1 mg IntraVENous PRN   ??? pantoprazole (PROTONIX) injection 40 mg  40 mg IntraVENous DAILY   ??? albuterol/ipratropium (DUONEB) neb solution   1 Dose Nebulization Q4HWA RT   ??? midazolam (VERSED) injection 2-4 mg  2-4 mg IntraVENous Q1H PRN   ??? sodium chloride (NS) flush 10 mL  10 mL InterCATHeter Q8H   ??? sodium chloride (NS) flush 10 mL  10 mL InterCATHeter PRN   ??? heparin (porcine) pf 300 Units  300 Units InterCATHeter PRN   ??? sodium chloride (NS) flush 20 mL  20 mL InterCATHeter PRN   ??? acetaminophen (TYLENOL) suppository 650 mg  650 mg Rectal Q6H PRN   ??? albuterol (PROVENTIL VENTOLIN) nebulizer solution 2.5 mg  2.5 mg Nebulization Q2H PRN   ??? thiamine (B-1) 100 mg in 0.9% sodium chloride 50 mL IVPB  100 mg IntraVENous DAILY   ??? nitroglycerin (NITROBID) 2 % ointment 2 Inch  2 Inch Topical Q6H PRN   ??? saline peripheral flush 5 mL  5 mL InterCATHeter PRN           Review of Systems:    A comprehensive review of systems was negative except for: Neurological: positive for memory problems, speech problems, coordination problems, gait problems and weakness    Objective:     Patient Vitals in the past 8 hrs:   BP Temp Pulse Resp SpO2   05/11/09 0736 - - - - 97 %   05/11/09 0700 117/46 mmHg - 76  26  97 %   05/11/09 0600 112/45 mmHg - 81  19  99 %   05/11/09 0500 108/43 mmHg - 80  14  99 %   05/11/09 0400 95/48 mmHg 98.7 ??F (37.1 ??C) 78  20  99 %   05/11/09 0300 110/40 mmHg - 78  19  99 %   05/11/09 0200 107/40 mmHg - 77  25  99 %   05/11/09 0100 111/43 mmHg - 80  19  -         In: 100 (100 I.V.)  Out: -   In: 4660.1 (50 P.O. 4610.1 I.V.)  Out: 3200 (2100 Urine 600 Drains)      Lab Review  Recent Results (from the past 24 hour(s))   CULTURE, FUNGUS, BLOOD    Collection Time    05/10/09  1:15 PM   Component Value Range   ??? Specimen Description: BLOOD     ??? Special Requests: HOLD 21 DAYS FOR FUNGUS     ??? Culture result: NO GROWTH AFTER 14 HOURS     ??? Report Status PENDING     GLUCOSE, POC    Collection Time    05/11/09 12:00 AM   Component Value Range   ??? POC GLUCOSE 146 (*) 65 - 105 (mg/dL)   CBC WITH AUTOMATED DIFF    Collection Time    05/11/09  4:10 AM   Component Value Range   ??? WBC PENDING  (K/uL)   ??? RBC 3.07 (*) 3.80 - 5.20 (M/uL)   ??? HGB 8.4 (*) 11.5 - 16.0 (g/dL)   ??? HCT 27.7 (*) 35.0 - 47.0 (%)   ??? MCV 90.2  80.0 - 99.0 (FL)   ??? MCH 27.4  26.0 - 34.0 (PG)   ??? MCHC 30.3  30.0 - 36.5 (g/dL)   ??? RDW 20.3 (*) 11.5 - 14.5 (%)   ??? PLATELET PENDING  (K/uL)   ??? NEUTROPHILS PENDING  (%)   ??? LYMPHOCYTES PENDING  (%)   ??? MONOCYTES PENDING  (%)   ??? EOSINOPHILS PENDING  (%)   ??? BASOPHILS PENDING  (%)   ??? ABSOLUTE NEUTS PENDING  (K/UL)   ??? ABSOLUTE LYMPHS PENDING  (K/UL)   ??? ABSOLUTE MONOS PENDING  (K/UL)   ??? ABSOLUTE EOSINS PENDING  (K/UL)   ??? ABSOLUTE BASOS PENDING  (K/UL)   ??? DF PENDING     METABOLIC PANEL, COMPREHENSIVE    Collection Time    05/11/09  4:10 AM   Component Value Range    ??? Sodium 154 (*) 136 - 145 (MMOL/L)   ??? Potassium 3.5  3.5 - 5.1 (MMOL/L)   ??? Chloride 123 (*) 97 - 108 (MMOL/L)   ??? CO2 21  21 - 32 (MMOL/L)   ??? Anion gap 10  5 - 15 (mmol/L)   ??? Glucose 131 (*) 65 - 100 (MG/DL)   ??? BUN 14  6 - 20 (MG/DL)   ??? Creatinine 0.8  0.6 - 1.3 (MG/DL)   ??? BUN/Creatinine ratio 18  12 - 20 ( )   ??? GFR est AA >60  >60 (ml/min/1.92m2)   ??? GFR est non-AA >60  >60 (ml/min/1.33m2)   ??? Calcium 8.7  8.5 - 10.1 (MG/DL)   ??? Bilirubin, total 3.3 (*) 0.2 - 1.0 (MG/DL)   ??? ALT 66  12 - 78 (U/L)   ??? AST 39 (*) 15 - 37 (U/L)   ??? Alk. phosphatase 113  50 - 136 (U/L)   ??? Protein, total 5.7 (*) 6.4 - 8.2 (g/dL)   ??? Albumin 2.0 (*) 3.5 - 5.0 (g/dL)   ??? Globulin 3.7  2.0 - 4.0 (g/dL)   ??? A-G Ratio 0.5 (*) 1.1 - 2.2 ( )   MAGNESIUM    Collection Time    05/11/09  4:10 AM   Component Value Range   ??? Magnesium 2.0  1.6 - 2.4 (MG/DL)   GLUCOSE, POC    Collection Time    05/11/09  6:58 AM   Component Value Range   ??? POC GLUCOSE 157 (*) 65 - 105 (mg/dL)         Additional comments:I reviewed the patient's other test results. labs  NEUROLOGICAL EXAM:    Appearance:  The patient is poorly developed and nourished, lethargic   Mental Status: Oriented to person. Trying to communicate and follows some commands. Mood and affect depressed.   Cranial Nerves:   Intact visual fields. Fundi are benign. PERLA, EOM's full, no nystagmus, no ptosis. Facial sensation is normal. Corneal reflexes are intact. Facial movement is symmetric. Hearing is normal bilaterally. Palate is midline with normal sternocleidomastoid and trapezius muscles are normal. Tongue is midline.   Motor:  4/5 strength in upper and lower proximal and distal muscles. Normal bulk and tone. No fasciculations.   Reflexes:   Deep tendon reflexes 1+/4 and symmetrical.   Sensory:   Normal to touch, pinprick    Gait:  Untestable gait.   Tremor:   No tremor noted.   Cerebellar:  No cerebellar signs present.    Neurovascular:  Normal heart sounds and regular rhythm, peripheral pulses intact, and no carotid bruits.         Assessment:     Patient Active Hospital Problem List:  Nausea with vomiting (05/01/2009)    Hypokalemia (05/01/2009)    Hepatic encephalopathy (05/01/2009)    Gait abnormality (05/01/2009)    Alcoholic cirrhosis of liver (05/01/2009)    Hypomagnesemia (05/01/2009)      Plan:   Encephalopathy improving  Will get PT and OT      Signed:  Maisie Fus A. Katrinka Blazing, MD  05/11/2009  8:34 AM

## 2009-05-11 NOTE — Progress Notes (Signed)
Problem: Self Care Deficits Care Plan (Adult)  Goal: *Acute Goals and Plan of Care (Insert Text)  Occupational Therapy Goals Initiated 05/11/09    1. Pt will complete grooming in supported position with minimal assistance within 7 days.  2. Pt will increase fine motor skills to open task containers (toothpaste, lotion, ect) with set up assist within 7 days.  3. When appropriate, transfer training in preparation for functional mobility with be initiated and appropriate goal will be set.  4. Pt will tolerated x 10 min edge of bed with minimal assistance while completing dynamic balance activity within 7 days.  5. Pt will move from supine to sit with minimal assistance within 7 days, in preparation for functional mobility.  OCCUPATIONAL THERAPY EVALUATION    NAME: Breanna Lester AGE: 48 y.o.  GENDER: female  DATE: 05/11/2009  PRIMARY DIAGNOSIS AND MEDICAL HISTORY: AMS, hypokalemia, lft upper extremity pain  AMS, hypokalemia, lft upper extremity pain  AMS, hypokalemia, lft upper extremity pain  <principal problem not specified>      Past Medical History   Diagnosis Date   ??? Liver disease 2009       liver failure   ??? Depression     ??? Anxiety       Past Surgical History   Procedure Date   ??? Hx tonsillectomy 1972   ??? Hx adenoidectomy 1972   ??? Hx cesarean section 1986 and 1993   ??? Hx hysterectomy 2008   ??? Hx dilation and curettage 1999     Problem List Never Reviewed       Class     Nausea with vomiting [787.01]             Hypokalemia [276.8A]             Hepatic encephalopathy [572.2]             Gait abnormality [781.2N]             Alcoholic cirrhosis of liver [571.2]             Hypomagnesemia [275.2B]                     Prior Level of Function/Home Situation: Pt used single point cane prior to admission.  Was otherwise Independent with ADLs.  No longer drives 2??  decreased visual acuity.  Home Environment: Private residence  # Steps to Enter: 4  One/Two Story Residence: Two story (bedroom on second level)   Living Alone: No (lives with husband and two adult daughters)  Support Systems: Family member(s)  Patient Expects to be Discharged to:: Rehabilitation facility  Current DME Used/Available at Home: Cane, straight   [X]          Right hand dominant            [ ]           Left hand dominant    Precautions: Standard, contact      SUBJECTIVE:   Patient stated ??? I'm a little nervous, but lets try this .???     OBJECTIVE DATA SUMMARY:   Cognitive/Behavioral Status:  Level of Consciousness: Drowsy;Eyes open spontaneously  Orientation Level: Oriented to person;Oriented to place;Oriented to time  Cognition: Decreased attention/concentration;Follows commands (slow processing, with time answers questions appropriately)  Safety/Judgement: Not assessed  Skin: skin breakdown around rectum, nursing aware per documentation  Edema:  None noted  Vision/Perceptual:    Poor visual accuity, near and far vision  Coordination:  Fine Motor  Skills-Upper: Comment (L UE in cast, gross grasp of objects w/ R hand )    Gross Motor Skills-Upper: Comment (Not assessed, limited antigravity movement 2/2 weakness)  Balance:  Sitting: Impaired;With support  Sitting - Static: Other (comment) (Decreased trunk strength CGA-min EOB after positioning)  Sitting - Dynamic: Poor (constant support)  Standing:  (not assessed 2/2 safety concerns)  Strength:  Significant generalized weakness in all 4 extremies. Weak grasp intact and palpable muscle activation in B UE.  Limited anti-gravity ROM in B LE, WFL in gravity eliminated position.  Tone & Sensation:  WDL  Range of Motion:  PROM WFL, limited AROM 2??  weakness  Functional Mobility and Transfers for ADLs:  Bed Mobility:  Overall level of assistance required: Max assist of 1  Transfers:  Sit to Stand:  (not tested)  Bed to Chair:  (not tested)              Toilet Transfer :  (flexi-seal and foley in place)                 ADL Assessment:  Feeding: Other (comment) (NPO)     Oral Facial Hygiene/Grooming: Total assistance    Bathing: Total assistance    Upper Body Dressing: Total assistance    Lower Body Dressing: Total assistance    Toileting: Total assistance    Cognitive Retraining  Safety/Judgement: Not assessed     Therapeutic Exercise:  Pt sat EOB x 12 min.  After positioning, pt sat with CGA with occaisonal min assist. Encouraged keeping head in neutral position, tendency to drop forward 2??  weakness. Pt participated in light trunk exercises to increase strength and dynamic balance.  Light trunk flexion and rotation, and then returning to neutral position.  Pt with activity tolerance.     Pain:  Pain Scale: Numeric (0 - 10)  Pain Intensity: 0  Activity Tolerance:  Vitals Assessment 1:  Patient Position : Pre-activity;Supine  BP: 114/39 mmHg  Pulse (Heart Rate): 78   O2 Sat (%): 97 %  O2 Device: Nasal cannula  O2 Flow Rate (L/min): 2 l/min    Vitals Assessment 2:  Patient Position : During activity;Sitting  BP 2: 108/52 mmHg  O2 Sat (%):  O2 Device:  O2 Flow Rate (L/min):     After treatment:  [X]          Patient left in no apparent distress  [X]          Call bell left within reach  [ ]          Caregiver present  [X]          Patient left up in chair, nursing staff notified      ASSESSMENT:   Based on the objective data described above, the patient presents with severe generalized weakness in trunk and extremities, limited endurance, minimal antigravity movement, poor balance, and slow processing.  Pt drowsy, but was engaged during treatment.  Appropriately responded to questions, and followed commands.  Anticipate pt will make good progress 2??  age and prior level of function.  Will benefit from OT address above with ADLs and functional mobility.    Patient will benefit from skilled intervention to address the above impairments.  Patient???s rehabilitation potential is considered to be Good  Factors which may influence rehabilitation potential include:  [ ]          None noted   [ ]          Mental ability/status  [X]   Medical condition  [ ]          Home/family situation and support systems  [ ]          Safety awareness  [ ]          Pain tolerance/management  [ ]          Other:     PLAN OF CARE:   Recommendations and Planned Interventions:  [X]          Self Care Training              [X]          Therapeutic Activities  [X]          Functional Mobility Training           [ ]          Cognitive Retraining  [X]          Therapeutic Exercises        [X]          Endurance Activities  [X]          Balance Training                 [X]          Neuromuscular Re-education  [X]          Patient Education                [ ]          Family Training/Education  [ ]          Visual/Perceptual Training   [X]          Home safety training  [ ]          Other (Comment):  Frequency/Duration:  Patient will be followed by occupational therapy 4-5 times a week  to address goals.  Discharge Recommendations:  With good progress, pt will likely be good candidate for inpatient rehab by time of discharge.  [X]          To be determined               [ ]          Day rehabilitation program  [ ]          Home without services  (home safety education provided)  [ ]          Home with Home Health services  (home safety education provided)  [ ]         Outpatient therapy                [ ]          Nursing facility for rehab  [X]          Inpatient Rehab Hospital    [ ]          Nursing facility for long term care  [ ]          Other:  Further Equipment Recommendations for Discharge: To be determined by progress  Communication/Collaboration:  [X]          Patient/family have participated as able in goal setting and plan of care.  [X]          Patient/family agree to work toward stated goals and plan of care.  [ ]          Patient understands intent and goals of therapy, but is neutral about his/her participation.  [ ]          Patient is unable to participate in goal setting and plan of care.   The patient???s plan  of care was discussed with:  Occupational Therapist  This patient???s plan of care  is  appropriate for delegation to OTA.    Thank you for this referral.  Mayer Camel, OTR/L  Time Calculation: 40 mins

## 2009-05-11 NOTE — Progress Notes (Signed)
Cardiology Progress Note      05/11/2009 9:41 AM    Admit Date: 04/29/2009      Subjective:     No c/o.    BP 115/41   Pulse 85   Temp 98.1 ??F (36.7 ??C)   Resp 25   Ht 5\' 2"  (1.575 m)   Wt 144 lb 13.5 oz (65.7 kg)   SpO2 95%   PF 40 L/min  Current facility-administered medications   Medication Dose Route Frequency   ??? lactulose (CHRONULAC) solution 20 g  20 g Oral BID   ??? aspirin chewable tablet 81 mg  81 mg Oral DAILY   ??? levothyroxine (SYNTHROID) injection 25 mcg  25 mcg IntraVENous DAILY   ??? TPN ADULT - PERIPHERAL     IntraVENous CONTINUOUS   ??? DISCONTD: TPN ADULT - PERIPHERAL     IntraVENous CONTINUOUS   ??? levofloxacin (LEVAQUIN) 750 mg infusion   750 mg IntraVENous Q24H   ??? vancomycin (VANCOCIN) 0.75 g in 0.9% sodium chloride 250 mL IVPB  750 mg IntraVENous Q12H   ??? dextrose 5% infusion    IntraVENous CONTINUOUS   ??? piperacillin-tazobactam (ZOSYN) 3.375 g in 0.9% sodium chloride (MBP/ADV) 100 mL MBP  3.375 g IntraVENous Q6H   ??? enoxaparin (LOVENOX) injection 70 mg  70 mg SubCUTAneous Q12H   ??? albumin human 5% (BUMINATE) solution 25 g  25 g IntraVENous Q4H PRN   ??? metoprolol tartrate (LOPRESSOR) injection 2.5 mg  2.5 mg IntraVENous Q6H   ??? morphine injection 1 mg  1 mg IntraVENous PRN   ??? pantoprazole (PROTONIX) injection 40 mg  40 mg IntraVENous DAILY   ??? albuterol/ipratropium (DUONEB) neb solution   1 Dose Nebulization Q4HWA RT   ??? midazolam (VERSED) injection 2-4 mg  2-4 mg IntraVENous Q1H PRN   ??? sodium chloride (NS) flush 10 mL  10 mL InterCATHeter Q8H   ??? sodium chloride (NS) flush 10 mL  10 mL InterCATHeter PRN   ??? heparin (porcine) pf 300 Units  300 Units InterCATHeter PRN   ??? sodium chloride (NS) flush 20 mL  20 mL InterCATHeter PRN   ??? acetaminophen (TYLENOL) suppository 650 mg  650 mg Rectal Q6H PRN   ??? albuterol (PROVENTIL VENTOLIN) nebulizer solution 2.5 mg  2.5 mg Nebulization Q2H PRN   ??? thiamine (B-1) 100 mg in 0.9% sodium chloride 50 mL IVPB  100 mg IntraVENous DAILY    ??? nitroglycerin (NITROBID) 2 % ointment 2 Inch  2 Inch Topical Q6H PRN   ??? saline peripheral flush 5 mL  5 mL InterCATHeter PRN           Objective:      Physical Exam:    Chest--occasional rhonchi  CV--rrr Nl S1S2 I/VI sem  ABD--+ BS, non-tender  EXT--unchanged    Data Review:   Labs:  Recent Results (from the past 24 hour(s))   CULTURE, FUNGUS, BLOOD    Collection Time    05/10/09  1:15 PM   Component Value Range   ??? Specimen Description: BLOOD     ??? Special Requests: HOLD 21 DAYS FOR FUNGUS     ??? Culture result: NO GROWTH AFTER 14 HOURS     ??? Report Status PENDING     GLUCOSE, POC    Collection Time    05/11/09 12:00 AM   Component Value Range   ??? POC GLUCOSE 146 (*) 65 - 105 (mg/dL)   CBC WITH AUTOMATED DIFF    Collection Time  05/11/09  4:10 AM   Component Value Range   ??? WBC PENDING  (K/uL)   ??? RBC 3.07 (*) 3.80 - 5.20 (M/uL)   ??? HGB 8.4 (*) 11.5 - 16.0 (g/dL)   ??? HCT 27.7 (*) 35.0 - 47.0 (%)   ??? MCV 90.2  80.0 - 99.0 (FL)   ??? MCH 27.4  26.0 - 34.0 (PG)   ??? MCHC 30.3  30.0 - 36.5 (g/dL)   ??? RDW 20.3 (*) 11.5 - 14.5 (%)   ??? PLATELET PENDING  (K/uL)   ??? NEUTROPHILS PENDING  (%)   ??? LYMPHOCYTES PENDING  (%)   ??? MONOCYTES PENDING  (%)   ??? EOSINOPHILS PENDING  (%)   ??? BASOPHILS PENDING  (%)   ??? ABSOLUTE NEUTS PENDING  (K/UL)   ??? ABSOLUTE LYMPHS PENDING  (K/UL)   ??? ABSOLUTE MONOS PENDING  (K/UL)   ??? ABSOLUTE EOSINS PENDING  (K/UL)   ??? ABSOLUTE BASOS PENDING  (K/UL)   ??? DF PENDING     METABOLIC PANEL, COMPREHENSIVE    Collection Time    05/11/09  4:10 AM   Component Value Range   ??? Sodium 154 (*) 136 - 145 (MMOL/L)   ??? Potassium 3.5  3.5 - 5.1 (MMOL/L)   ??? Chloride 123 (*) 97 - 108 (MMOL/L)   ??? CO2 21  21 - 32 (MMOL/L)   ??? Anion gap 10  5 - 15 (mmol/L)   ??? Glucose 131 (*) 65 - 100 (MG/DL)   ??? BUN 14  6 - 20 (MG/DL)   ??? Creatinine 0.8  0.6 - 1.3 (MG/DL)   ??? BUN/Creatinine ratio 18  12 - 20 ( )   ??? GFR est AA >60  >60 (ml/min/1.14m2)   ??? GFR est non-AA >60  >60 (ml/min/1.93m2)   ??? Calcium 8.7  8.5 - 10.1 (MG/DL)    ??? Bilirubin, total 3.3 (*) 0.2 - 1.0 (MG/DL)   ??? ALT 66  12 - 78 (U/L)   ??? AST 39 (*) 15 - 37 (U/L)   ??? Alk. phosphatase 113  50 - 136 (U/L)   ??? Protein, total 5.7 (*) 6.4 - 8.2 (g/dL)   ??? Albumin 2.0 (*) 3.5 - 5.0 (g/dL)   ??? Globulin 3.7  2.0 - 4.0 (g/dL)   ??? A-G Ratio 0.5 (*) 1.1 - 2.2 ( )   MAGNESIUM    Collection Time    05/11/09  4:10 AM   Component Value Range   ??? Magnesium 2.0  1.6 - 2.4 (MG/DL)   GLUCOSE, POC    Collection Time    05/11/09  6:58 AM   Component Value Range   ??? POC GLUCOSE 157 (*) 65 - 105 (mg/dL)         Telemetry: normal sinus rhythm      Assessment:     Patient Active Hospital Problem List:  Nausea with vomiting (05/01/2009)    Hypokalemia (05/01/2009)    Hepatic encephalopathy (05/01/2009)    Gait abnormality (05/01/2009)    Alcoholic cirrhosis of liver (05/01/2009)    Hypomagnesemia (05/01/2009)    Non ST elevation MI  Plan:     Continue supportive rx  ASA, lovenox, beta blocker  ?statin (held due to hepatic issues)  Eventual ischemic w/u after clears    Ellwood Sayers, MD

## 2009-05-11 NOTE — Progress Notes (Addendum)
Hospitalist Progress Note         NAME: Breanna Lester        DOB:  Jun 08, 1961        MRN:  [id number]      Assessment & Plan   Sepsis syndrome :underlying pneumonia, fever, leukocytosis  ????????????????????????Broad spectrum antibiotics.  ????????????????????????No evidence of UTI or ascites(SBP), has an umbilical hernia that is not     reducible but overall adbomen is soft, has RUQ pain, CT gallbladder sludge and thickenned wall, no fever 24h    Acute respiratory failure:got extubated doing well off the ventilator    Altered mental status : multifactorial Sepsis, hypernatremia, ARF, hyperamonemia   has improved AMS, trying to communicate now     Hypernatremia: improving    Pneumonia: c/w levaquin , zosyn and Vancomycin    Nausea with vomiting (05/01/2009) resolved, negative KUB and [place]    Hypokalemia (05/01/2009)  continue supplement KCL as needed  ????????????????????????Recheck labs    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    Gait abnormality (05/01/2009) B12, folate normal, PT/OT when MS improved    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     Review of Systems:  Fever/chills y   Cough N   Sputum    N   SOB/DOE  N   Chest Pain N   Abdominal Pain y   Diarrhea N   Constipation N   Nausea/Vomiting N   Dysuria N   Tolerating PT N   Tolerating Diet N   Could not obtain due to AMS vs intubation         Objective:    Patient extubated yesterday, critically ill, sedation was D/C more awake today , follow commands, decision for PEG was revoked patient on TPN, I will await before restarting enteral feed patient c/o RUQ pain    VITALS:   Last 24hrs VS reviewed since prior progress note. Most recent are:  Visit Vitals   Item Reading   ??? BP 117/46   ??? Pulse 76   ??? Temp 98.7 ??F (37.1 ??C)   ??? Resp 26   ??? Ht 5\' 2"  (1.575 m)   ??? Wt 144 lb 13.5 oz (65.7 kg)   ??? SpO2 97%   ??? PF 40L/min           Intake/Output Summary (Last 24 hours) at 05/11/09 0719  Last data filed at 05/11/09 0706   Gross per 24 hour   Intake 3465.05 ml   Output   1740 ml   Net 1725.05 ml        Telemetry Reviewed:     PHYSICAL EXAM:  General: Extubated, awake ,   HEENT: Pupils unequal, R 3mm, L 2mm  Lungs:  Bilateral rales, ronchi   Heart:  Regular rhythm, tachycardic, SEM  Abdomen: Soft, Non distended, hypoactive BS,RUQ pain,  no rebound, umbilical hernia non reducible,  Extremities: No edema, pulses present, brace in left arm  Neurologic:?? GCS M5E4V4, no  gross deficit  Psych:???? AMS    Lab Data Reviewed: (see below)    Medications Reviewed: (see below)    PMH/SH reviewed - no change compared to H&P  ______________________________________________________________________  Total time spent with patient: 45 minutes     Critical Care Provided     Minutes non procedure based    Care Plan discussed with:  Patient x   Family    RN x   Care Manager    Consultant/Specialist      >50% of visit spent in counseling and coordination of care       Prophylaxis:  GI PPI   DVT lovenox     Disposition:   Home with Family    HH/PT/OT/RN    SNF/LTC x   [facility]      ______________________________________________________________________  Attending Physician: Hazeline Junker, MD   _____________________________________________________________________________________________________  Procedures: see electronic medical records for all procedures/Xrays and details   which were not copied into this note but were reviewed prior to creation of Plan.      LABS:  Recent Labs   Basename 05/10/09 0550 05/09/09 0510   ??? WBC 11.0 12.6*   ??? HGB 8.5* 9.0*   ??? HCT 28.0* 29.9*   ??? PLT 67* 56*         Recent Labs   Basename 05/10/09 0340 05/09/09 0510   ??? NA 149* 153*   ??? K 3.5 3.8   ??? CL 118* 124*   ??? CO2 22 22   ??? BUN 15 24*   ??? CREA 0.8 0.9   ??? GLU 114* 120*   ??? CA 8.7 8.5   ??? MG 1.9 2.4   ??? PHOS -- --   ??? URICA -- --         Recent Labs   Basename 05/10/09 0340 05/09/09 0510   ??? SGOT 41* 52*   ??? GPT 71 82*   ??? AP 112 110   ??? TBIL 3.9* 4.1*   ??? TP 5.6* 5.7*   ??? ALB 2.2* 2.2*   ??? GLOB 3.4 3.5   ??? GGT -- --   ??? AML -- --   ??? LPSE -- --         Recent Labs   Basename 05/10/09 0340   ??? INR 1.7*   ??? PTP 16.7*   ??? APTT --          No results found for this basename: FE:2,TIBC:2,PSAT:2,FERR:2, in the last 72 hours   Recent Labs   Basename 05/09/09 1844 05/09/09 1407   ??? PH 7.41 7.42   ??? PCO2 37 30*   ??? PO2 106* 134*         Recent Labs   Basename 05/09/09 0510   ??? CPK 229*   ??? CKMB --         Lab Results   Component Value Date/Time    POC GLUCOSE 146 05/11/2009 12:00 AM    POC GLUCOSE 114 05/01/2009  4:40 PM    POC GLUCOSE 162 05/01/2009 12:02 PM    POC GLUCOSE 115 05/01/2009  8:39 AM         Lab Results   Component Value Date/Time    Color DARK YELLOW 05/09/2009 11:05 AM    Appearance CLEAR 05/09/2009 11:05 AM    Specific gravity 1.010 10/29/2008 11:00 AM    Specific gravity 1.024 05/09/2009 11:05 AM    pH 6.0 05/09/2009 11:05 AM    Protein TRACE 05/09/2009 11:05  AM    Glucose NEGATIVE  05/09/2009 11:05 AM    Ketone NEGATIVE  05/09/2009 11:05 AM    Bilirubin NEGATIVE  04/30/2009 12:14 AM    Urobilinogen 0.2 05/09/2009 11:05 AM    Nitrites NEGATIVE  05/09/2009 11:05 AM    Leukocyte Esterase SMALL 05/09/2009 11:05 AM    Epithelial cells 0-5 05/09/2009 11:05 AM    Bacteria NEGATIVE  05/09/2009 11:05 AM    WBC 5-10 05/09/2009 11:05 AM    RBC 10-20 05/09/2009 11:05 AM           MEDICATIONS:   Current facility-administered medications   Medication Dose Route Frequency   ??? lactulose (CHRONULAC) solution 20 g  20 g Oral BID   ??? aspirin chewable tablet 81 mg  81 mg Oral DAILY   ??? levothyroxine (SYNTHROID) injection 25 mcg  25 mcg IntraVENous DAILY   ??? TPN ADULT - PERIPHERAL     IntraVENous CONTINUOUS   ??? DISCONTD: TPN ADULT - PERIPHERAL     IntraVENous CONTINUOUS   ??? levofloxacin (LEVAQUIN) 750 mg infusion   750 mg IntraVENous Q24H   ??? vancomycin (VANCOCIN) 0.75 g in 0.9% sodium chloride 250 mL IVPB  750 mg IntraVENous Q12H   ??? dextrose 5% infusion    IntraVENous CONTINUOUS   ??? piperacillin-tazobactam (ZOSYN) 3.375 g in 0.9% sodium chloride (MBP/ADV) 100 mL MBP  3.375 g IntraVENous Q6H   ??? enoxaparin (LOVENOX) injection 70 mg  70 mg SubCUTAneous Q12H   ??? DISCONTD: levothyroxine (SYNTHROID) tablet 50 mcg  50 mcg Oral ACB   ??? albumin human 5% (BUMINATE) solution 25 g  25 g IntraVENous Q4H PRN   ??? metoprolol tartrate (LOPRESSOR) injection 2.5 mg  2.5 mg IntraVENous Q6H   ??? DISCONTD: lactulose (CHRONULAC) solution 20 g  20 g Oral BID   ??? DISCONTD: aspirin (ASPIRIN) tablet 325 mg  325 mg Oral DAILY   ??? morphine injection 1 mg  1 mg IntraVENous PRN   ??? pantoprazole (PROTONIX) injection 40 mg  40 mg IntraVENous DAILY   ??? albuterol/ipratropium (DUONEB) neb solution   1 Dose Nebulization Q4HWA RT   ??? midazolam (VERSED) injection 2-4 mg  2-4 mg IntraVENous Q1H PRN   ??? sodium chloride (NS) flush 10 mL  10 mL InterCATHeter Q8H   ??? sodium chloride (NS) flush 10 mL  10 mL InterCATHeter PRN   ??? heparin (porcine) pf 300 Units  300 Units InterCATHeter PRN   ??? sodium chloride (NS) flush 20 mL  20 mL InterCATHeter PRN   ??? acetaminophen (TYLENOL) suppository 650 mg  650 mg Rectal Q6H PRN   ??? albuterol (PROVENTIL VENTOLIN) nebulizer solution 2.5 mg  2.5 mg Nebulization Q2H PRN   ??? thiamine (B-1) 100 mg in 0.9% sodium chloride 50 mL IVPB  100 mg IntraVENous DAILY    ??? nitroglycerin (NITROBID) 2 % ointment 2 Inch  2 Inch Topical Q6H PRN   ??? saline peripheral flush 5 mL  5 mL InterCATHeter PRN

## 2009-05-11 NOTE — Progress Notes (Addendum)
Progress Note:  Follow-up for TF initiation.  TF not started yet; NGT pulled, placed again today 9/21; clamped.  Decision for PEG revoked.  Pt on TPN 4.25% a.a. 10% dextrose @ 53ml/hr.  TPN not meeting needs.  No new TPN orders.  Per MD progress note, waiting before restarting enteral feed pt c/o RUQ pain.      Recommendations:  1.Now that Propofol is d/c'd, recommended goal rate of TF is 40 ml/hr.  2.Strict aspiration precautions ??  3.HOB >30 degrees    Thank you,  Fransico Him, RD  Pager [phone]

## 2009-05-12 LAB — METABOLIC PANEL, COMPREHENSIVE
A-G Ratio: 0.5 — ABNORMAL LOW (ref 1.1–2.2)
ALT (SGPT): 52 U/L (ref 12–78)
AST (SGOT): 27 U/L (ref 15–37)
Albumin: 2 g/dL — ABNORMAL LOW (ref 3.5–5.0)
Alk. phosphatase: 107 U/L (ref 50–136)
Anion gap: 10 mmol/L (ref 5–15)
BUN/Creatinine ratio: 20 (ref 12–20)
BUN: 14 MG/DL (ref 6–20)
Bilirubin, total: 2.7 MG/DL — ABNORMAL HIGH (ref 0.2–1.0)
CO2: 22 MMOL/L (ref 21–32)
Calcium: 8.9 MG/DL (ref 8.5–10.1)
Chloride: 122 MMOL/L — ABNORMAL HIGH (ref 97–108)
Creatinine: 0.7 MG/DL (ref 0.6–1.3)
GFR est AA: >60 mL/min/{1.73_m2} (ref >60)
GFR est non-AA: >60 mL/min/{1.73_m2} (ref >60)
Globulin: 3.7 g/dL (ref 2.0–4.0)
Glucose: 153 MG/DL — ABNORMAL HIGH (ref 65–100)
Potassium: 3.7 MMOL/L (ref 3.5–5.1)
Protein, total: 5.7 g/dL — ABNORMAL LOW (ref 6.4–8.2)
Sodium: 154 MMOL/L — ABNORMAL HIGH (ref 136–145)

## 2009-05-12 LAB — CBC WITH AUTOMATED DIFF
ABS. BASOPHILS: 0 10*3/uL (ref 0.0–0.1)
ABS. EOSINOPHILS: 0.2 10*3/uL (ref 0.0–0.4)
ABS. LYMPHOCYTES: 1.6 10*3/uL (ref 0.8–3.5)
ABS. MONOCYTES: 0.7 10*3/uL (ref 0.0–1.0)
ABS. NEUTROPHILS: 7.2 10*3/uL (ref 1.8–8.0)
BASOPHILS: 0 % (ref 0–1)
EOSINOPHILS: 2 % (ref 0–7)
HCT: 26.7 % — ABNORMAL LOW (ref 35.0–47.0)
HGB: 8 g/dL — ABNORMAL LOW (ref 11.5–16.0)
LYMPHOCYTES: 17 % (ref 12–49)
MCH: 27.5 PG (ref 26.0–34.0)
MCHC: 30 g/dL (ref 30.0–36.5)
MCV: 91.8 FL (ref 80.0–99.0)
MONOCYTES: 7 % (ref 5–13)
NEUTROPHILS: 74 % (ref 32–75)
PLATELET: 56 10*3/uL — ABNORMAL LOW (ref 150–400)
RBC: 2.91 M/uL — ABNORMAL LOW (ref 3.80–5.20)
RDW: 21 % — ABNORMAL HIGH (ref 11.5–14.5)
WBC: 9.7 10*3/uL (ref 3.6–11.0)

## 2009-05-12 LAB — GLUCOSE, POC
Glucose (POC): 161 mg/dL — ABNORMAL HIGH (ref 65–105)
Glucose (POC): 148 mg/dL — ABNORMAL HIGH (ref 65–105)
Glucose (POC): 140 mg/dL — ABNORMAL HIGH (ref 65–105)

## 2009-05-12 MED ADMIN — thiamine (B-1) 100 mg in 0.9% sodium chloride 50 mL IVPB: INTRAVENOUS | @ 14:00:00 | NDC 63323001302

## 2009-05-12 MED ADMIN — vancomycin (VANCOCIN) 0.75 g in 0.9% sodium chloride 250 mL IVPB: INTRAVENOUS | @ 07:00:00 | NDC 63323031461

## 2009-05-12 MED ADMIN — dextrose 5% infusion: INTRAVENOUS | @ 22:00:00 | NDC 00409792209

## 2009-05-12 MED ADMIN — metoprolol tartrate (LOPRESSOR) injection 2.5 mg: INTRAVENOUS | @ 09:00:00 | NDC 00143987310

## 2009-05-12 MED ADMIN — enoxaparin (LOVENOX) injection 70 mg: SUBCUTANEOUS | @ 12:00:00 | NDC 00075062430

## 2009-05-12 MED ADMIN — sodium chloride (NS) flush 10 mL: @ 19:00:00 | NDC 87701099893

## 2009-05-12 MED ADMIN — piperacillin-tazobactam (ZOSYN) 3.375 g in 0.9% sodium chloride (MBP/ADV) 100 mL MBP: INTRAVENOUS | @ 04:00:00 | NDC 00338055318

## 2009-05-12 MED ADMIN — albuterol/ipratropium (DUONEB) neb solution: RESPIRATORY_TRACT | @ 16:00:00 | NDC 00487980101

## 2009-05-12 MED ADMIN — sodium chloride (NS) flush 10 mL: @ 17:00:00 | NDC 87701099893

## 2009-05-12 MED ADMIN — sodium chloride (NS) flush 10 mL: @ 03:00:00 | NDC 87701099893

## 2009-05-12 MED ADMIN — albuterol/ipratropium (DUONEB) neb solution: RESPIRATORY_TRACT | NDC 00487980101

## 2009-05-12 MED ADMIN — sodium chloride (NS) flush 10 mL: @ 15:00:00 | NDC 87701099893

## 2009-05-12 MED ADMIN — sodium chloride (NS) 0.9 % flush: @ 17:00:00 | NDC 87701099893

## 2009-05-12 MED ADMIN — piperacillin-tazobactam (ZOSYN) 3.375 g in 0.9% sodium chloride (MBP/ADV) 100 mL MBP: INTRAVENOUS | @ 22:00:00 | NDC 00338055318

## 2009-05-12 MED ADMIN — levothyroxine (SYNTHROID) injection 25 mcg: INTRAVENOUS | @ 14:00:00 | NDC 63323024710

## 2009-05-12 MED ADMIN — TPN ADULT - PERIPHERAL: INTRAVENOUS | @ 22:00:00 | NDC 00517293025

## 2009-05-12 MED ADMIN — vancomycin (VANCOCIN) 0.75 g in 0.9% sodium chloride 250 mL IVPB: INTRAVENOUS | @ 18:00:00 | NDC 63323031461

## 2009-05-12 MED ADMIN — metoprolol tartrate (LOPRESSOR) injection 2.5 mg: INTRAVENOUS | @ 04:00:00 | NDC 00143987310

## 2009-05-12 MED ADMIN — sodium chloride (NS) flush 10 mL: @ 10:00:00 | NDC 87701099893

## 2009-05-12 MED ADMIN — enoxaparin (LOVENOX) injection 70 mg: SUBCUTANEOUS | @ 01:00:00 | NDC 00075062430

## 2009-05-12 MED ADMIN — aspirin chewable tablet 81 mg: ORAL | @ 12:00:00

## 2009-05-12 MED ADMIN — piperacillin-tazobactam (ZOSYN) 3.375 g in 0.9% sodium chloride (MBP/ADV) 100 mL MBP: INTRAVENOUS | @ 01:00:00 | NDC 00338055318

## 2009-05-12 MED ADMIN — lactulose (CHRONULAC) solution 20 g: ORAL | @ 02:00:00 | NDC 00121457730

## 2009-05-12 MED ADMIN — piperacillin-tazobactam (ZOSYN) 3.375 g in 0.9% sodium chloride (MBP/ADV) 100 mL MBP: INTRAVENOUS | @ 17:00:00 | NDC 00338055318

## 2009-05-12 MED ADMIN — lactulose (CHRONULAC) solution 20 g: ORAL | @ 14:00:00 | NDC 00121457730

## 2009-05-12 MED ADMIN — metoprolol tartrate (LOPRESSOR) injection 2.5 mg: INTRAVENOUS | @ 22:00:00 | NDC 00143987310

## 2009-05-12 MED ADMIN — albuterol/ipratropium (DUONEB) neb solution: RESPIRATORY_TRACT | @ 12:00:00 | NDC 00487980101

## 2009-05-12 MED ADMIN — metoprolol tartrate (LOPRESSOR) injection 2.5 mg: INTRAVENOUS | @ 17:00:00 | NDC 00143987310

## 2009-05-12 MED ADMIN — piperacillin-tazobactam (ZOSYN) 3.375 g in 0.9% sodium chloride (MBP/ADV) 100 mL MBP: INTRAVENOUS | @ 09:00:00 | NDC 00338055318

## 2009-05-12 MED ADMIN — pantoprazole (PROTONIX) injection 40 mg: INTRAVENOUS | @ 14:00:00 | NDC 00409488810

## 2009-05-12 MED ADMIN — levofloxacin (LEVAQUIN) 750 mg infusion: INTRAVENOUS | @ 15:00:00 | NDC 00045006601

## 2009-05-12 MED ADMIN — albuterol/ipratropium (DUONEB) neb solution: RESPIRATORY_TRACT | @ 20:00:00 | NDC 00487980101

## 2009-05-12 MED FILL — ZOSYN 3.375 GRAM INTRAVENOUS SOLUTION: 3.375 gram | INTRAVENOUS | Qty: 3.38

## 2009-05-12 MED FILL — LOVENOX 30 MG/0.3 ML SUB-Q SYRINGE: 30 mg/0.3 mL | SUBCUTANEOUS | Qty: 1

## 2009-05-12 MED FILL — LEVOTHYROXINE 200 MCG IJ SOLR: 200 mcg | INTRAVENOUS | Qty: 5

## 2009-05-12 MED FILL — METOPROLOL TARTRATE 5 MG/5 ML IV SOLN: 5 mg/ mL | INTRAVENOUS | Qty: 5

## 2009-05-12 MED FILL — IPRATROPIUM BROMIDE 0.02 % SOLN FOR INHALATION: 0.02 % | RESPIRATORY_TRACT | Qty: 7.5

## 2009-05-12 MED FILL — VANCOMYCIN 10 GRAM IV SOLR: 10 gram | INTRAVENOUS | Qty: 0.75

## 2009-05-12 MED FILL — SODIUM CHLORIDE 0.9 % INJECTION: INTRAMUSCULAR | Qty: 20

## 2009-05-12 MED FILL — THIAMINE 100 MG/ML INJECTION: 100 mg/mL | INTRAMUSCULAR | Qty: 1

## 2009-05-12 MED FILL — SALINE FLUSH INJECTION SYRINGE: INTRAMUSCULAR | Qty: 20

## 2009-05-12 MED FILL — SALINE FLUSH INJECTION SYRINGE: INTRAMUSCULAR | Qty: 10

## 2009-05-12 MED FILL — DEXTROSE 5% IN WATER (D5W) IV: INTRAVENOUS | Qty: 1000

## 2009-05-12 MED FILL — LEVAQUIN 750 MG/150 ML IN 5 % DEXTROSE INTRAVENOUS PIGGYBACK: 750 mg/150 mL | INTRAVENOUS | Qty: 150

## 2009-05-12 MED FILL — ALBUTEROL SULFATE 0.083 % (0.83 MG/ML) SOLN FOR INHALATION: 2.5 mg /3 mL (0.083 %) | RESPIRATORY_TRACT | Qty: 1

## 2009-05-12 MED FILL — AMINOSYN II 10 % IV: 10 % | INTRAVENOUS | Qty: 685.1

## 2009-05-12 MED FILL — LACTULOSE 10 GRAM/15 ML ORAL SOLN: 10 gram/15 mL | ORAL | Qty: 30

## 2009-05-12 MED FILL — BUMINATE 5 % INTRAVENOUS SOLUTION: 5 % | INTRAVENOUS | Qty: 500

## 2009-05-12 MED FILL — ASPIRIN 81 MG CHEWABLE TAB: 81 mg | ORAL | Qty: 1

## 2009-05-12 NOTE — Progress Notes (Signed)
 Formatting of this note is different from the original.  Problem: Self Care Deficits Care Plan (Adult)  Goal: *Acute Goals and Plan of Care (Insert Text)  Occupational Therapy Goals Initiated 05/11/09    1. Pt will complete grooming in supported position with minimal assistance within 7 days.  2. Pt will increase fine motor skills to open task containers (toothpaste, lotion, ect) with set up assist within 7 days.  3. When appropriate, transfer training in preparation for functional mobility with be initiated and appropriate goal will be set.  4. Pt will tolerated x 10 min edge of bed with minimal assistance while completing dynamic balance activity within 7 days.  5. Pt will move from supine to sit with minimal assistance within 7 days, in preparation for functional mobility.   Outcome: Progressing Towards Goal  OCCUPATIONAL THERAPY TREATMENT    NAME: Breanna Lester AGE: 48 y.o.  GENDER: female  DATE: 05/12/2009  PRIMARY DIAGNOSIS: AMS, hypokalemia, lft upper extremity pain  AMS, hypokalemia, lft upper extremity pain  AMS, hypokalemia, lft upper extremity pain  <principal problem not specified>  Precautions: Standard, fall, contact      OBJECTIVE DATA SUMMARY:   Cognitive/Behavioral Status:  Level of Consciousness: Alert  Orientation Level: Oriented X4  Cognition: Follows commands (slow processing)  Safety/Judgement: Not assessed  Functional Mobility and Transfers for ADLs:  Bed Mobility:  Overall level of assistance required: max assist  Transfers:  Sit to Stand: Additional time;Assist X2;Total assistance, patient performs <25%  Bed to Chair:  (not tested)              Toilet Transfer :  (flexi-seal and foley in place)    Balance:  Sitting: Impaired;With support  Sitting - Static: Poor (constant support) (anterior pelvic tilt with decreased trunk extension due to )  Sitting - Dynamic: Poor (constant support)  Standing: With support;Without support (Poor)  ADL Intervention:  In preparation for functional mobility, pt  practiced rolling onto side and bringing legs off of bed.  Pt able to bring B LE off of bed without assistance. Hand over hand assist to reach R UE to L grabbar.  Unable to maintain grasp, so max assist to roll. Pt able to assist 2  positioning of legs off bed, but >25% assistance.     Pt and therapist set mini-goal that by next treatment session, patient will increase active R UE ROM to reach and touch L grab bar.  Pt nodded yes, when asked if it was a goal she could achieve.    Pain:  Pain Scale: Verbal  Pain Intensity: 0    Activity Tolerance:  Vitals Assessment 1:  Patient Position : Pre-activity;Supine  BP: 117/56 mmHg  Pulse (Heart Rate): 85   O2 Sat (%): 98 %  O2 Device: Room air  O2 Flow Rate (L/min): 2 l/min    Vitals Assessment 2:  Patient Position : During activity;Sitting  BP 2: 108/52 mmHg  O2 Sat (%):  O2 Device:  O2 Flow Rate (L/min):   Vitals Assessment 3:  O2 Sat (%):  O2 Device:  O2 Flow Rate (L/min): Vitals Assessment 4:  O2 Sat (%):  O2 Device:  O2 Flow Rate (L/min):     After treatment:  [X]    Patient left in NAD  [X]    Call bell left within reach  [ ]    Caregiver present  [X]    Patient left up in chair, nursing staff notified    ASSESSMENT AND PROGRESSION TOWARD GOALS:   Pt  will increased LE movement against gravity this session, and followed 100% verbal commands.  Engaged and motivated, but remains very weak, especially UE.    Patient?s progression toward goals is as follows:  [X]    Improving appropriately and progressing toward goals  [ ]    Improving slowly and progressing toward goals  [ ]    Not making progress toward goals and plan of care will be adjusted    PLAN OF CARE:   Patient continues to benefit from skilled intervention to address the above impairments.  Continue treatment per established plan of care.  Planned Interventions:  [X]    Self Care Training                    [X]     Therapeutic Activities  [X]    Functional Mobility Training     [ ]     Cognitive Retraining  [X]     Therapeutic Exercises              [X]     Endurance Activities  [X]    Balance Training                       [X]     Neuromuscular Re-education  [X]    Patient Education                      [ ]     Family Training/Education  [ ]    Visual/Perceptual Training         [X]     Home safety training  [ ]    Other (Comment):  Discharge Recommendations:  [X]    To be determined                                 [ ]     Day rehabilitation program  [ ]    Home without services  (home safety education provided)  [ ]    Home with Home Health services  (home safety education provided)  [ ]   Outpatient therapy                                  [ ]     Nursing facility for rehab  [X]    Inpatient Rehab Hospital                      [ ]     Nursing facility for long term care  [ ]    Other:  Communication/Collaboration:  The patient?s plan of care was discussed with:  Registered Nurse    Delon LITTIE Blunt, OTR/L  Time Calculation: 18 mins          Electronically signed by Blunt Delon LITTIE, OTR/L at 05/12/2009  5:02 PM EDT

## 2009-05-12 NOTE — Progress Notes (Signed)
 Formatting of this note might be different from the original.  Follow up visit with patient who was alert and attempted to talk. RN [place] was present.  Pt seemed to understand and respond to inquiry of being baptist.  Pt attempted to communicate, chaplain was unable to understand.  Prayer was offered and patient accepted with nod of her head.  RN, chaplain and patient prayed together.  Pt appears to have a husband, but I am unsure of any other family members.  Pt attempted to try to talk, but I could not understand.  Prayed with pt.  Visit by: Rev. Jennifer L. Dockum, D.Min, MA, PRN Chaplain  Electronically signed by Chanda Delon CROME at 05/12/2009 11:38 AM EDT

## 2009-05-12 NOTE — Progress Notes (Signed)
 Formatting of this note is different from the original.       Hospitalist Progress Note         NAME: Breanna Lester        DOB:  Feb 01, 1961        MRN:  [id number]      Assessment & Plan   Sepsis syndrome :underlying pneumonia, fever, leukocytosis  Broad spectrum antibiotics.  No evidence of UTI or ascites(SBP), has an umbilical hernia that is not     reducible but overall adbomen is soft, has RUQ pain, CT gallbladder sludge and thickenned wall, no fever 24h    Acute respiratory failure:got extubated doing well off the ventilator    Altered mental status : multifactorial Sepsis, hypernatremia, ARF, hyperamonemia   has improved AMS, trying to communicate now     Hypernatremia: stable, will increase hypotonic IVF.    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     Review of Systems:  Fever/chills y   Cough N   Sputum    N   SOB/DOE  N   Chest Pain N   Abdominal Pain y   Diarrhea N   Constipation N   Nausea/Vomiting N   Dysuria N   Tolerating PT N   Tolerating Diet N   Could not obtain due to AMS vs intubation        Objective:   Patient extubated, critically ill, sedation was D/C more awake today ,  follow commands, decision for PEG was revoked patient on TPN, I will await before restarting enteral feed patient c/o RUQ pain    VITALS:   Last 24hrs VS reviewed since prior progress note. Most recent are:  Visit Vitals   Item Reading   ? BP 119/51   ? Pulse 78   ? Temp 98.1 F (36.7 C)   ? Resp 21   ? Ht 5' 2 (1.575 m)   ? Wt 144 lb 13.5 oz (65.7 kg)   ? SpO2 98%   ? PF 40L/min     Intake/Output Summary (Last 24 hours) at 05/12/09 1425  Last data filed at 05/12/09 1200   Gross per 24 hour   Intake   4247 ml   Output   1830 ml   Net   2417 ml       Telemetry Reviewed:     PHYSICAL EXAM:  General: Extubated, awake ,   HEENT: Pupils unequal, R 3mm, L 2mm  Lungs:  Bilateral rales, ronchi   Heart:  Regular rhythm, tachycardic, SEM  Abdomen: Soft, Non distended, hypoactive BS,RUQ pain,  no rebound, umbilical hernia non reducible,  Extremities: No  edema, pulses present, brace in left arm  Neurologic: GCS M5E4V4, no gross deficit  Psych: AMS    Lab Data Reviewed: (see below)    Medications Reviewed: (see below)    PMH/SH reviewed - no change compared to H&P  ______________________________________________________________________      Care Plan discussed with:  Patient x   Family    RN x   Care Manager    Consultant/Specialist      >50% of visit spent in counseling and coordination of care       Prophylaxis:  GI PPI   DVT lovenox      Disposition:   Home with Family    HH/PT/OT/RN    SNF/LTC x   [facility]      ______________________________________________________________________  Attending Physician: Kerney Meter, MD   _____________________________________________________________________________________________________  Procedures: see electronic medical records for all procedures/Xrays and details  which were not copied into this note but were reviewed prior to creation of Plan.      LABS:  Recent Labs   Basename 05/12/09 0400 05/11/09 0410   ? WBC 9.7 9.2   ? HGB 8.0* 8.4*   ? HCT 26.7* 27.7*   ? PLT 56* 58*     Recent Labs    Basename 05/12/09 0400 05/11/09 0410 05/10/09 0340   ? NA 154* 154* 149*   ? K 3.7 3.5 3.5   ? CL 122* 123* 118*   ? CO2 22 21 22    ? BUN 14 14 15    ? CREA 0.7 0.8 0.8   ? GLU 153* 131* 114*   ? CA 8.9 8.7 8.7   ? MG -- 2.0 1.9   ? PHOS -- -- --   ? URICA -- -- --     Recent Labs   Basename 05/12/09 0400 05/11/09 0410 05/10/09 0340   ? SGOT 27 39* 41*   ? GPT 52 66 71   ? AP 107 113 112   ? TBIL 2.7* 3.3* 3.9*   ? TP 5.7* 5.7* 5.6*   ? ALB 2.0* 2.0* 2.2*   ? GLOB 3.7 3.7 3.4   ? GGT -- -- --   ? AML -- -- --   ? LPSE -- -- --     Recent Labs   Basename 05/10/09 0340   ? INR 1.7*   ? PTP 16.7*   ? APTT --         No results found for this basename: FE:2,TIBC:2,PSAT:2,FERR:2, in the last 72 hours   Recent Labs   Basename 05/09/09 1844   ? PH 7.41   ? PCO2 37   ? PO2 106*     No results found for this basename: CPK:3,CKMB:3,TROPONINI:3, in the last 72 hours    Lab Results   Component Value Date/Time    POC GLUCOSE 140 05/12/2009 11:39 AM    POC GLUCOSE 148 05/12/2009  5:40 AM    POC GLUCOSE 161 05/11/2009 11:18 PM    POC GLUCOSE 164 05/11/2009 12:34 PM    POC GLUCOSE 157 05/11/2009  6:58 AM     Lab Results   Component Value Date/Time    Color DARK YELLOW 05/09/2009 11:05 AM    Appearance CLEAR 05/09/2009 11:05 AM    Specific gravity 1.010 10/29/2008 11:00 AM    Specific gravity 1.024 05/09/2009 11:05 AM    pH 6.0 05/09/2009 11:05 AM    Protein TRACE 05/09/2009 11:05 AM    Glucose NEGATIVE  05/09/2009 11:05 AM  Ketone NEGATIVE  05/09/2009 11:05 AM    Bilirubin NEGATIVE  04/30/2009 12:14 AM    Urobilinogen 0.2 05/09/2009 11:05 AM    Nitrites NEGATIVE  05/09/2009 11:05 AM    Leukocyte Esterase SMALL 05/09/2009 11:05 AM    Epithelial cells 0-5 05/09/2009 11:05 AM    Bacteria NEGATIVE  05/09/2009 11:05 AM    WBC 5-10 05/09/2009 11:05 AM    RBC 10-20 05/09/2009 11:05 AM     MEDICATIONS:  Current facility-administered medications   Medication Dose Route Frequency   ? sodium chloride  (NS) 0.9 % flush        ? VANCOMYCIN TROUGH LEVEL  1 Each Does Not  Apply ONCE   ? TPN ADULT - PERIPHERAL     IntraVENous CONTINUOUS   ? DISCONTD: VANCOMYCIN TROUGH LEVEL  1 Each Does Not Apply 1 TIME PRN   ? lactulose (CHRONULAC) solution 20 g  20 g Oral BID   ? aspirin chewable tablet 81 mg  81 mg Oral DAILY   ? levothyroxine (SYNTHROID) injection 25 mcg  25 mcg IntraVENous DAILY   ? DISCONTD: TPN ADULT - PERIPHERAL     IntraVENous CONTINUOUS   ? levofloxacin (LEVAQUIN) 750 mg infusion   750 mg IntraVENous Q24H   ? vancomycin (VANCOCIN) 0.75 g in 0.9% sodium chloride  250 mL IVPB  750 mg IntraVENous Q12H   ? dextrose 5% infusion    IntraVENous CONTINUOUS   ? piperacillin-tazobactam (ZOSYN) 3.375 g in 0.9% sodium chloride  (MBP/ADV) 100 mL MBP  3.375 g IntraVENous Q6H   ? enoxaparin  (LOVENOX ) injection 70 mg  70 mg SubCUTAneous Q12H   ? albumin human 5% (BUMINATE) solution 25 g  25 g IntraVENous Q4H PRN   ? metoprolol  tartrate (LOPRESSOR ) injection 2.5 mg  2.5 mg IntraVENous Q6H   ? morphine  injection 1 mg  1 mg IntraVENous PRN   ? pantoprazole  (PROTONIX ) injection 40 mg  40 mg IntraVENous DAILY   ? albuterol /ipratropium (DUONEB ) neb solution   1 Dose Nebulization Q4HWA RT   ? midazolam (VERSED) injection 2-4 mg  2-4 mg IntraVENous Q1H PRN   ? sodium chloride  (NS) flush 10 mL  10 mL InterCATHeter Q8H   ? sodium chloride  (NS) flush 10 mL  10 mL InterCATHeter PRN   ? heparin (porcine) pf 300 Units  300 Units InterCATHeter PRN   ? sodium chloride  (NS) flush 20 mL  20 mL InterCATHeter PRN   ? acetaminophen  (TYLENOL ) suppository 650 mg  650 mg Rectal Q6H PRN   ? albuterol  (PROVENTIL  VENTOLIN ) nebulizer solution 2.5 mg  2.5 mg Nebulization Q2H PRN   ? thiamine (B-1) 100 mg in 0.9% sodium chloride  50 mL IVPB  100 mg IntraVENous DAILY   ? nitroglycerin (NITROBID) 2 % ointment 2 Inch  2 Inch Topical Q6H PRN   ? saline peripheral flush 5 mL  5 mL InterCATHeter PRN       Electronically signed by Dominique Cons, MD at 05/12/2009  2:27 PM EDT

## 2009-05-12 NOTE — Progress Notes (Signed)
 Formatting of this note might be different from the original.  Checked on pt per RD note: NGT has been re-placed but no tfeeds handing and no order yet for enteral nutrition.  SLP working w/ pt.    Nutrition following.  Recommendations in for formula and rate.  Madison Sar, PhD, RD, CNSD  Electronically signed by Sar Madison BRAVO, PhD, RD, CNSC at 05/12/2009  8:05 AM EDT

## 2009-05-12 NOTE — Progress Notes (Signed)
 Formatting of this note is different from the original.  Problem: Dysphagia (Adult)  Goal: *Acute Goals and Plan of Care (Insert Text)  Speech Therapy Goals  Initiated 05/10/09  1. Patient will participate in re-evaluation of swallow within 7 days  2. Patient will tolerate least restrictive diet without signs or symptoms of aspiration within 7 days  3. Patient will participate in MBS as needed/appropriate within 7 days   SPEECH LANGUAGE PATHOLOGY DYSPHAGIA TREATMENT    NAME : Breanna Lester AGE: 48 y.o.  GENDER: female  DATE: 05/12/2009  PRIMARY DIAGNOSIS: AMS, hypokalemia, lft upper extremity pain  AMS, hypokalemia, lft upper extremity pain  AMS, hypokalemia, lft upper extremity pain  <principal problem not specified>    SUBJECTIVE:   Patient stated ? Can I have a ginger ale now? Laughing at one point in swallowing tx; unsure why. Agreeable to tx. Hoarse vocal quality makes attempts at verbalizations unintelligible.    OBJECTIVE:   Cognitive and Communication Status:  Level of Consciousness: Alert    Dysphagia Treatment:  Oral Assessment:  Oral Assessment  Labial: No impairment  Dentition: Extractions  Oral Hygiene: Thick dried secretions of tongue and lower teeth  Lingual: No impairment    P.O. Trials:  Patient Position: Upright in bed    The patient was given the following:  Consistency Presented: Ice chips;Thin liquid  How Presented: SLP-fed/presented;Spoon    ORAL PHASE:  Bolus Acceptance: No impairment  Bolus Formation/Control: Impaired ; slow to chew ice chips. Appropriate with teaspoon water .  Propulsion: Delayed  Oral Residue: None    PHARYNGEAL PHASE:  Initiation of Swallow: Delayed  Laryngeal Elevation: Functional  Aspiration Signs/Symptoms: Change respiration;Weak cough . RR increased to 31. Patient with wet cough. Expelled thick blood-tinged mucous.  Vocal Quality: Hoarse;Breathy (low volume)  Cues for Modifications: Moderate  Effective Modifications: None  Pain:  Pain Screen  Pain Scale: Numeric (0 -  10)  Pain Intensity: 0  Faces (Wong-Baker) Scale: Hurts even more  Pain Onset: Movement  Pain Location: Arm  Pain Orientation: Left  Pain Intervention(s): Repositioned   After treatment:  [X]    Patient left in NAD  [X]    Call bell left within reach  [ ]    Caregiver present  [ ]    Patient left up in chair, nursing staff notified    ASSESSMENT AND PROGRESSION TOWARD GOALS:   Oropharyngeal dysphagia persist. Continues to become more awake/alert. If alertness continues and respiratory status remains stable, will be appropriate for a MBS in 1-2 days. Strict oral care recommended.    Patient?s progression toward goals is as follows:  [X]    Improving appropriately and progressing toward goals  [ ]    Improving slowly and progressing toward goals  [ ]    Not making progress toward goals and plan of care will be adjusted    PLAN OF CARE:   Patient continues to benefit from skilled intervention to address the above impairments.    Recommendations and Planned Interventions:  dysphagia tx    Any new recommendations or planned interventions including recommended diet changes were discussed with:  [X]   Patient   [ ]    Family      [X]    Nursing  [ ]    Provider  [ ]   Posted safety precautions in patient?s room    Communication/Collaboration:  The patient?s plan of care was discussed with:  Registered Nurse    Eleanor DELENA Dunker, SLP    Time Calculation: 20 mins  Electronically signed by Oval Eleanor LABOR, SLP at 05/12/2009 12:25 PM EDT

## 2009-05-12 NOTE — Progress Notes (Signed)
 Formatting of this note is different from the original.  Problem: Mobility Impaired (Adult)  Goal: *Acute Goals and Plan of Care (Insert Text)  Physical Therapy Goals  Initiated 05/12/2009  1. Patient will move from supine to sit and sit to supine , scoot up and down and roll side to side in bed with moderate assistance within 7 day(s).   2. Patient will transfer from bed to chair and chair to bed with moderate assistance using the least restrictive device within 7 day(s).  3. Patient will perform sit to stand with moderate assistance within 7 day(s).  4. Patient will be able to sit at EOB x 5 minutes while maintaining midline with minimum assist within 7 days.   5. Patient will perform 1 sets of 15 repetitions of active strengthening exercises for bilateral upper and lower extremity(s) with supervision/set-up within 7 day(s).   PHYSICAL THERAPY EVALUATION    NAME: Breanna Lester AGE: 48 y.o.  GENDER: female  DATE: 05/12/2009  PRIMARY DIAGNOSIS AND MEDICAL HISTORY: AMS, hypokalemia, lft upper extremity pain  AMS, hypokalemia, lft upper extremity pain  AMS, hypokalemia, lft upper extremity pain  <principal problem not specified>    Past Medical History   Diagnosis Date   ? Liver disease 2009       liver failure   ? Depression     ? Anxiety       Past Surgical History   Procedure Date   ? Hx tonsillectomy 1972   ? Hx adenoidectomy 1972   ? Hx cesarean section 1986 and 1993   ? Hx hysterectomy 2008   ? Hx dilation and curettage 1999     Problem List Never Reviewed       Class     Nausea with vomiting [787.01]         Hypokalemia [276.8A]         Hepatic encephalopathy [572.2]         Gait abnormality [781.2N]         Alcoholic cirrhosis of liver [571.2]         Hypomagnesemia [275.2B]           Prior Level of Function/Home Situation: unable to determine, pt with incomprehensible speech  Home Situation  Home Environment: Private residence  # Steps to Enter: 4  One/Two Story Residence: Two story (bedroom on second  level)  Living Alone: No (lives with husband and two adult daughters)  Support Systems: Family member(s)  Patient Expects to be Discharged to:: Rehabilitation facility  Current DME Used/Available at Home: Cane, straight     Precautions: contact, NWB on LUE due to unstable wrist fx that needs to be repaired once pt is medically stable, NG tube, rectal tube    SUBJECTIVE:   Patient stated : incomprehensible speech    OBJECTIVE DATA SUMMARY:   Critical Behavior:  Level of Consciousness: Alert  but confused.  Orientation Level: Oriented X4  Cognition: Decreased attention/concentration;Impaired decision making;Poor safety awareness ; 25% of command following with additional time due to slow processing  Safety/Judgement: Not assessed  S kin: intact  Strength:   Moderate to severe general debilitation  Range Of Motion:  Generally decreased but functional  Functional Mobility:  Bed Mobility:  Overall level of assistance required: total assist with additional time  Transfers:  Sit to Stand: Additional time;Assist X2;Total assistance, patient performs <25%  Bed to Chair:  (not tested)  Balance:   Sitting: Impaired;With support  Sitting - Static: Poor (constant support) (anterior  pelvic tilt with decreased trunk extension due to weakness; posterior lean )  Sitting - Dynamic: Poor (constant support)  Standing: With support;Without support (Poor)    Pain: Per nrsg  Pain Scale: Numeric (0 - 10)  Pain Intensity: 0  Pain Location: Arm  Activity Tolerance:  Vitals Assessment 1:  Patient Position : Pre-activity;Supine  BP: 118/48 mmHg  Pulse (Heart Rate): 83   O2 Sat (%): 98 %with RA    [X]   Fall prevention education was provided and the patient/caregiver indicated understanding.  After treatment:  [X]   Patient left in no apparent distress  [X]   Call bell left within reach  [ ]   Caregiver present  [ ]   Patient left up in chair, nursing staff notified    ASSESSMENT:   Based on the objective data described above, the patient presents  with  decreased cognition with only 25% command following and slow processing. Pt was a total assist with bed mobility and transfer due to moderate to severe general debilitation but was initiating movement. Too unsafe to put pt in chair due to poor balance.    Patient will benefit from skilled intervention to address the above impairments.  Patient?s rehabilitation potential is considered to be Fair  Factors which may influence rehabilitation potential include:  [ ]   None noted  [X]   Mental ability/status  [ ]   Medical condition  [ ]   Home/family situation and support systems  [ ]   Safety awareness  [ ]   Pain tolerance/management  [ ]   Other:    PLAN OF CARE:   Recommendations and Planned Interventions:  [X]   Bed Mobility Training                     [ ]   Neuromuscular Re-education  [X]   Transfer Training                          [ ]   Orthotic/Prosthetic Training  [X]   Gait Training                                 [ ]   Modalities  [X]   Therapeutic Exercises                 [ ]   Edema Management/Control  [X]   Therapeutic Activities                   [X]   Patient and Family Training/Education  [ ]   Other (comment):  Frequency/Duration:  Patient will be followed by physical therapy 5 times a week  to address goals.  Discharge Recommendations:  [X]   To be determined                         [ ]   Day rehabilitation program  [ ]   Home without services                  [ ]   Outpatient therapy  [ ]   Home with Home Health services [ ]   Nursing facility for rehab  [ ]   Inpatient Rehab Hospital                [ ]   Nursing facility for long term care  [ ]   Other:  Further Equipment Recommendations for Discharge: TBD  Communication/Collaboration:  [ ]   Patient/family have participated as able in  goal setting and plan of care.  [ ]   Patient/family agree to work toward stated goals and plan of care.  [ ]   Patient understands intent and goals of therapy, but is neutral about his/her participation.  [X]   Patient is unable to  participate in goal setting and plan of care.  The patient?s plan of care was discussed with:  Registered Nurse.    Thank you for this referral.  Jenne GORMAN Sharps, PT, DPT  Time Calculation: 11 mins          Electronically signed by Sharps Jenne GORMAN, PT, DPT at 05/12/2009 11:22 AM EDT

## 2009-05-12 NOTE — Progress Notes (Signed)
 Formatting of this note is different from the original.  TRANSFER - IN REPORT:    Verbal report received from [place], RN on [name]  being received from CCU for routine progression of care      Report consisted of patient?s Situation, Background, Assessment and   Recommendations(SBAR).     Information from the following report(s) SBAR, [facility], [facility] and Recent Results was reviewed with the receiving nurse.    Opportunity for questions and clarification was provided.      Assessment completed upon patient?s arrival to unit and care assumed.       Electronically signed by [place], Erica M, RN at 05/12/2009  4:28 PM EDT

## 2009-05-12 NOTE — Progress Notes (Signed)
 Formatting of this note is different from the original.  PULMONARY/Critical Care/SLEEP MEDICINE - [facility]  Name: Breanna Lester   DOB: [birth year]   MRN: [id number]   Date: 05/12/2009 10:24 AM   [x]     I have reviewed the flowsheet and previous day?s notes.  []    The patient is unable to give any meaningful history or review of systems because the patient is:  []    Intubated []       []    Sedated    []    Unresponsive      []    The patient is critically ill on      []    Mechanical ventilation []    Pressors   []    BiPAP []           ROS:A comprehensive review of systems was negative except for: Respiratory: positive for dyspnea on exertion  Musculoskeletal: positive for muscle weakness    Vital Signs:    BP 118/48  Pulse 83  Temp 98 F (36.7 C)  Resp 23  Ht 5' 2 (1.575 m)  Wt 144 lb 13.5 oz (65.7 kg)  SpO2 98%  PF 40 L/min    O2 Device: Nasal cannula;Ventimask   O2 Flow Rate (L/min): 2 l/min   Temp (24hrs), Avg:98.2 F (36.8 C), Min:97.6 F (36.4 C), Max:98.9 F (37.2 C)      Intake/Output:   Last shift:      In: 334.4 (134.4 I.V.)  Out: 210 (210 Urine)    Last 3 shifts: In: 6679.7 (50 P.O. 6119.7 I.V.)  Out: 3400 (2700 Urine 200 Drains)      Intake/Output Summary (Last 24 hours) at 05/12/09 1024  Last data filed at 05/12/09 0900   Gross per 24 hour   Intake   4260 ml   Output   2105 ml   Net   2155 ml     Hemodynamics:   PAP:     Wedge:     CVP:      CO:     CI:     SVR: BP 2: 108/52 mmHg (05/11/09 1330)   PVR:        Ventilator Settings:    Physical Exam:    General: []    Intubated/sedated []    No acute distress []        [x]    Ill appearing []     []          HEENT: []    Icteric [x]    PERRL []        [x]    Anicteric Mucosa:[x]    moist   []    dry []         Resp: []    Wheeze []    Clear to ascultation bilaterally []    Accessory muscle use    []    Rales []    Chest tube:  []    Air leak []        [x]    Ronchi []    Crepitus []       []    Coarse bilateral ventilator breath sounds     CV: [x]    Regular  []    Tachycardia []        []    Irregular []    Bradycardic []        []    Murmur []    S3 []        []    Edema []    S4 []         GI: [x]    Soft  []    NG Tube  Bowel sounds: [x]    present []    absent    []    Firm []    PEG []        []    Distended  []         GU: [x]    Foley []    Hematuria []        [x]    Clear urine []    Edema []         MSK:    []    SCD?s []    Edema []        [x]    No deformities []     []         Skin: [x]    Warm [x]    Dry  []        []    Cool []    Moist []        []    Hot  []    Diaphoretic []        []    Rash  []    Cyanosis []         N-psych: []    Sedated  []    Agitated []        [x]    Alert  Follows commands:   [x]    Yes  []    No []         Devices: []    ET-Tube []    Tracheostomy []    Chest tube: Air leak? []    Y/[]    N    []    [employer] []    PA Catheter []        []    PICC []     []         DATA:   Current facility-administered medications   Medication Dose Route Frequency   ? VANCOMYCIN TROUGH LEVEL  1 Each Does Not Apply ONCE   ? TPN ADULT - PERIPHERAL     IntraVENous CONTINUOUS   ? DISCONTD: VANCOMYCIN TROUGH LEVEL  1 Each Does Not Apply 1 TIME PRN   ? lactulose (CHRONULAC) solution 20 g  20 g Oral BID   ? aspirin chewable tablet 81 mg  81 mg Oral DAILY   ? levothyroxine (SYNTHROID) injection 25 mcg  25 mcg IntraVENous DAILY   ? DISCONTD: TPN ADULT - PERIPHERAL     IntraVENous CONTINUOUS   ? levofloxacin (LEVAQUIN) 750 mg infusion   750 mg IntraVENous Q24H   ? vancomycin (VANCOCIN) 0.75 g in 0.9% sodium chloride  250 mL IVPB  750 mg IntraVENous Q12H   ? dextrose  5% infusion    IntraVENous CONTINUOUS   ? piperacillin-tazobactam (ZOSYN) 3.375 g in 0.9% sodium chloride  (MBP/ADV) 100 mL MBP  3.375 g IntraVENous Q6H   ? enoxaparin  (LOVENOX ) injection 70 mg  70 mg SubCUTAneous Q12H   ? albumin human 5% (BUMINATE) solution 25 g  25 g IntraVENous Q4H PRN   ? metoprolol  tartrate (LOPRESSOR ) injection 2.5 mg  2.5 mg IntraVENous Q6H   ? morphine  injection 1 mg  1 mg IntraVENous PRN   ? pantoprazole  (PROTONIX ) injection  40 mg  40 mg IntraVENous DAILY   ? albuterol /ipratropium (DUONEB ) neb solution   1 Dose Nebulization Q4HWA RT   ? midazolam (VERSED) injection 2-4 mg  2-4 mg IntraVENous Q1H PRN   ? sodium chloride  (NS) flush 10 mL  10 mL InterCATHeter Q8H   ? sodium chloride  (NS) flush 10 mL  10 mL InterCATHeter PRN   ? heparin (porcine) pf 300 Units  300 Units InterCATHeter PRN   ? sodium chloride  (NS) flush 20 mL  20 mL InterCATHeter PRN   ?  acetaminophen  (TYLENOL ) suppository 650 mg  650 mg Rectal Q6H PRN   ? albuterol  (PROVENTIL  VENTOLIN ) nebulizer solution 2.5 mg  2.5 mg Nebulization Q2H PRN   ? thiamine (B-1) 100 mg in 0.9% sodium chloride  50 mL IVPB  100 mg IntraVENous DAILY   ? nitroglycerin (NITROBID) 2 % ointment 2 Inch  2 Inch Topical Q6H PRN   ? saline peripheral flush 5 mL  5 mL InterCATHeter PRN     Telemetry: [x]    Sinus []    A-flutter []    Paced    []    A-fib []    Multiple PVC?s     Labs:  Recent Labs   Basename 05/12/09 0400 05/11/09 0410 05/10/09 0550   ? WBC 9.7 9.2 11.0   ? HGB 8.0* 8.4* 8.5*   ? HCT 26.7* 27.7* 28.0*   ? PLT 56* 58* 67*     Recent Labs   Basename 05/12/09 0400 05/11/09 0410 05/10/09 0340   ? NA 154* 154* 149*   ? K 3.7 3.5 3.5   ? CL 122* 123* 118*   ? CO2 22 21 22    ? GLU 153* 131* 114*   ? BUN 14 14 15    ? CREA 0.7 0.8 0.8   ? CA 8.9 8.7 8.7   ? MG -- 2.0 1.9   ? PHOS -- -- --   ? ALB 2.0* 2.0* 2.2*   ? TBIL 2.7* 3.3* 3.9*   ? SGOT 27 39* 41*   ? INR -- -- 1.7*     Recent Labs   [facility] 05/09/09 1844 05/09/09 1407   ? PH 7.41 7.42   ? PCO2 37 30*   ? PO2 106* 134*   ? HCO3 22 19*   ? FIO2 40 40     Imaging:  [x]    I have personally reviewed the patient?s radiographs  []    Radiographs reviewed with radiologist   [x]    No change from prior, tubes and lines in adequate position  []    Improved   []    Worsening    IMPRESSION:     The patient is: [x]    acutely ill Risk of deterioration: [x]    moderate    []    critically ill  []    high     Patient Active Hospital Problem List:  Nausea with vomiting  (05/01/2009)    Hypokalemia (05/01/2009)    Hepatic encephalopathy (05/01/2009)    Gait abnormality (05/01/2009)    Alcoholic cirrhosis of liver (05/01/2009)    Hypomagnesemia (05/01/2009)         PLAN:  1. Wean O2  2. Cont iv abx  3. PUD/DVT prophylaxis  4. PO as tol  5. Transfuse if needed  6. Mobilize  7. Transfer out of ccu     [x]    See my orders for details    My assessment/plan was discussed with:  [x]    nursing []    PT/OT    []    respiratory therapy []    Dr.   Dalbert    family []         []    Total critical care time exclusive of procedures       minutes  Catalina FABIENE Sanders, MD      Electronically signed by Sanders Catalina SAUNDERS, MD at 05/12/2009 10:27 AM EDT

## 2009-05-12 NOTE — Progress Notes (Signed)
 Formatting of this note might be different from the original.  Report called to [facility] for transfer to [id number], transferred via bed with monitor without incident.    Electronically signed by Buddy Tilton LABOR, RN at 05/12/2009  4:21 PM EDT

## 2009-05-12 NOTE — Progress Notes (Signed)
 Formatting of this note might be different from the original.  Ortho-    Notes reviewed, pt remains obtunded and confused S/P distal radius/ulna fx on Sept 7th.  Pt's fx is in acceptable alignment on last x-ray, will re-check towards end of week or early next week.  Most likely will treat non operatively.  Maintain NWB on L UE.    Lupita NOVAK. Boyd, MD    Electronically signed by Boyd Lupita NOVAK, MD at 05/12/2009 12:29 PM EDT

## 2009-05-12 NOTE — Progress Notes (Signed)
 Formatting of this note is different from the original.  Cardiology Progress Note    05/12/2009 10:03 AM    Admit Date: 04/29/2009    Subjective:     More awake, sitting up, follows commands.  No c/o.    BP 118/48  Pulse 83  Temp 98 F (36.7 C)  Resp 23  Ht 5' 2 (1.575 m)  Wt 144 lb 13.5 oz (65.7 kg)  SpO2 98%  PF 40 L/min  Current facility-administered medications   Medication Dose Route Frequency   ? VANCOMYCIN TROUGH LEVEL  1 Each Does Not Apply ONCE   ? TPN ADULT - PERIPHERAL     IntraVENous CONTINUOUS   ? DISCONTD: VANCOMYCIN TROUGH LEVEL  1 Each Does Not Apply 1 TIME PRN   ? lactulose (CHRONULAC) solution 20 g  20 g Oral BID   ? aspirin chewable tablet 81 mg  81 mg Oral DAILY   ? levothyroxine (SYNTHROID) injection 25 mcg  25 mcg IntraVENous DAILY   ? DISCONTD: TPN ADULT - PERIPHERAL     IntraVENous CONTINUOUS   ? levofloxacin (LEVAQUIN) 750 mg infusion   750 mg IntraVENous Q24H   ? vancomycin (VANCOCIN) 0.75 g in 0.9% sodium chloride  250 mL IVPB  750 mg IntraVENous Q12H   ? dextrose 5% infusion    IntraVENous CONTINUOUS   ? piperacillin-tazobactam (ZOSYN) 3.375 g in 0.9% sodium chloride  (MBP/ADV) 100 mL MBP  3.375 g IntraVENous Q6H   ? enoxaparin  (LOVENOX ) injection 70 mg  70 mg SubCUTAneous Q12H   ? albumin human 5% (BUMINATE) solution 25 g  25 g IntraVENous Q4H PRN   ? metoprolol  tartrate (LOPRESSOR ) injection 2.5 mg  2.5 mg IntraVENous Q6H   ? morphine  injection 1 mg  1 mg IntraVENous PRN   ? pantoprazole  (PROTONIX ) injection 40 mg  40 mg IntraVENous DAILY   ? albuterol /ipratropium (DUONEB ) neb solution   1 Dose Nebulization Q4HWA RT   ? midazolam (VERSED) injection 2-4 mg  2-4 mg IntraVENous Q1H PRN   ? sodium chloride  (NS) flush 10 mL  10 mL InterCATHeter Q8H   ? sodium chloride  (NS) flush 10 mL  10 mL InterCATHeter PRN   ? heparin (porcine) pf 300 Units  300 Units InterCATHeter PRN   ? sodium chloride  (NS) flush 20 mL  20 mL InterCATHeter PRN   ? acetaminophen  (TYLENOL ) suppository 650 mg  650 mg  Rectal Q6H PRN   ? albuterol  (PROVENTIL  VENTOLIN ) nebulizer solution 2.5 mg  2.5 mg Nebulization Q2H PRN   ? thiamine (B-1) 100 mg in 0.9% sodium chloride  50 mL IVPB  100 mg IntraVENous DAILY   ? nitroglycerin (NITROBID) 2 % ointment 2 Inch  2 Inch Topical Q6H PRN   ? saline peripheral flush 5 mL  5 mL InterCATHeter PRN         Objective:     Physical Exam:  Weak, chronically ill-appearing in NAD  Chest--clear  CV--rrr Nl S1S2  ABD--mild tender  Ext--unchanged    Data Review:   Labs:  Recent Results (from the past 24 hour(s))   GLUCOSE, POC    Collection Time    05/11/09 12:34 PM   Component Value Range   ? POC GLUCOSE 164 (*) 65 - 105 (mg/dL)   GLUCOSE, POC    Collection Time    05/11/09 11:18 PM   Component Value Range   ? POC GLUCOSE 161 (*) 65 - 105 (mg/dL)   CBC WITH AUTOMATED DIFF    Collection Time  05/12/09  4:00 AM   Component Value Range   ? WBC 9.7  3.6 - 11.0 (K/uL)   ? RBC 2.91 (*) 3.80 - 5.20 (M/uL)   ? HGB 8.0 (*) 11.5 - 16.0 (g/dL)   ? HCT 26.7 (*) 35.0 - 47.0 (%)   ? MCV 91.8  80.0 - 99.0 (FL)   ? MCH 27.5  26.0 - 34.0 (PG)   ? MCHC 30.0  30.0 - 36.5 (g/dL)   ? RDW 21.0 (*) 11.5 - 14.5 (%)   ? PLATELET 56 (*) 150 - 400 (K/uL)   ? NEUTROPHILS 74  32 - 75 (%)   ? LYMPHOCYTES 17  12 - 49 (%)   ? MONOCYTES 7  5 - 13 (%)   ? EOSINOPHILS 2  0 - 7 (%)   ? BASOPHILS 0  0 - 1 (%)   ? ABSOLUTE NEUTS 7.2  1.8 - 8.0 (K/UL)   ? ABSOLUTE LYMPHS 1.6  0.8 - 3.5 (K/UL)   ? ABSOLUTE MONOS 0.7  0.0 - 1.0 (K/UL)   ? ABSOLUTE EOSINS 0.2  0.0 - 0.4 (K/UL)   ? ABSOLUTE BASOS 0.0  0.0 - 0.1 (K/UL)   ? PLATELET COMMENTS CLUMPED PLATELETS PRESENT     ? RBC COMMENTS 2+ ANISOCYTOSIS     ? DF SMEAR SCANNED     METABOLIC PANEL, COMPREHENSIVE    Collection Time    05/12/09  4:00 AM   Component Value Range   ? Sodium 154 (*) 136 - 145 (MMOL/L)   ? Potassium 3.7  3.5 - 5.1 (MMOL/L)   ? Chloride 122 (*) 97 - 108 (MMOL/L)   ? CO2 22  21 - 32 (MMOL/L)   ? Anion gap 10  5 - 15 (mmol/L)   ? Glucose 153 (*) 65 - 100 (MG/DL)   ? BUN 14  6 -  20 (MG/DL)   ? Creatinine 0.7  0.6 - 1.3 (MG/DL)   ? BUN/Creatinine ratio 20  12 - 20 ( )   ? GFR est AA >60  >60 (ml/min/1.54m2)   ? GFR est non-AA >60  >60 (ml/min/1.36m2)   ? Calcium 8.9  8.5 - 10.1 (MG/DL)   ? Bilirubin, total 2.7 (*) 0.2 - 1.0 (MG/DL)   ? ALT 52  12 - 78 (U/L)   ? AST 27  15 - 37 (U/L)   ? Alk. phosphatase 107  50 - 136 (U/L)   ? Protein, total 5.7 (*) 6.4 - 8.2 (g/dL)   ? Albumin 2.0 (*) 3.5 - 5.0 (g/dL)   ? Globulin 3.7  2.0 - 4.0 (g/dL)   ? A-G Ratio 0.5 (*) 1.1 - 2.2 ( )   GLUCOSE, POC    Collection Time    05/12/09  5:40 AM   Component Value Range   ? POC GLUCOSE 148 (*) 65 - 105 (mg/dL)     Telemetry: normal sinus rhythm    Assessment:     Patient Active Hospital Problem List:  Nausea with vomiting (05/01/2009)    Hypokalemia (05/01/2009)    Hepatic encephalopathy (05/01/2009)    Gait abnormality (05/01/2009)    Alcoholic cirrhosis of liver (05/01/2009)    Hypomagnesemia (05/01/2009)    Hypoxic respiratory failure/pneumonia    NSTEMI    Anemia    L arm fracture    Plan:     Slowly improving--continue ASA, lovenox , beta blocker  Add statin at some point after hepatic clears  PT  Nutrition  Ischemic w/u  later (stress MPI; do NOT feel cath would be good idea at this point)    Norleen MICAEL Gelineau, MD      Electronically signed by Gelineau Norleen ORN, MD at 05/12/2009 10:08 AM EDT

## 2009-05-12 NOTE — Progress Notes (Signed)
 Formatting of this note might be different from the original.  Right triple lumen PICC line dressing changed. 4.5cms out.Anne Heflin Rn IV Nurse  Electronically signed by Emmanuel Dragon, RN at 05/12/2009  2:11 PM EDT

## 2009-05-12 NOTE — Progress Notes (Signed)
 Formatting of this note might be different from the original.  Report given to oncoming RN, Barista signed by Marry Anette LABOR, RN at 05/12/2009  7:39 AM EDT

## 2009-05-12 NOTE — Progress Notes (Signed)
PULMONARY/Critical Care/SLEEP MEDICINE ??? [facility]  Name: Breanna Lester   DOB: Mar 29, 1961   MRN: [id number]   Date: 05/12/2009 10:24 AM   [x]     I have reviewed the flowsheet and previous day???s notes.  []    The patient is unable to give any meaningful history or review of systems because the patient is:  []    Intubated []       []    Sedated    []    Unresponsive      []    The patient is critically ill on      []    Mechanical ventilation []    Pressors   []    BiPAP []                     ROS:A comprehensive review of systems was negative except for: Respiratory: positive for dyspnea on exertion  Musculoskeletal: positive for muscle weakness    Vital Signs:    BP 118/48   Pulse 83   Temp 98 ??F (36.7 ??C)   Resp 23   Ht 5\' 2"  (1.575 m)   Wt 144 lb 13.5 oz (65.7 kg)   SpO2 98%   PF 40 L/min    O2 Device: Nasal cannula;Ventimask   O2 Flow Rate (L/min): 2 l/min   Temp (24hrs), Avg:98.2 ??F (36.8 ??C), Min:97.6 ??F (36.4 ??C), Max:98.9 ??F (37.2 ??C)       Intake/Output:   Last shift:      In: 334.4 (134.4 I.V.)  Out: 210 (210 Urine)    Last 3 shifts: In: 6679.7 (50 P.O. 6119.7 I.V.)  Out: 3400 (2700 Urine 200 Drains)      Intake/Output Summary (Last 24 hours) at 05/12/09 1024  Last data filed at 05/12/09 0900   Gross per 24 hour   Intake   4260 ml   Output   2105 ml   Net   2155 ml       Hemodynamics:   PAP:     Wedge:     CVP:      CO:     CI:     SVR: BP 2: 108/52 mmHg (05/11/09 1330)   PVR:        Ventilator Settings:    Physical Exam:    General: []    Intubated/sedated []    No acute distress []        [x]    Ill appearing []     []           HEENT: []    Icteric [x]    PERRL []        [x]    Anicteric Mucosa:[x]    moist   []    dry []          Resp: []    Wheeze []    Clear to ascultation bilaterally []    Accessory muscle use    []    Rales []    Chest tube:  []    Air leak []        [x]    Ronchi []    Crepitus []       []    Coarse bilateral ventilator breath sounds      CV: [x]    Regular []    Tachycardia []         []    Irregular []    Bradycardic []        []    Murmur []    S3 []        []    Edema []    S4 []   GI: [x]    Soft  []    NG Tube Bowel sounds: [x]    present []    absent    []    Firm []    PEG []        []    Distended  []          GU: [x]    Foley []    Hematuria []        [x]    Clear urine []    Edema []          MSK:    []    SCD???s []    Edema []        [x]    No deformities []     []          Skin: [x]    Warm [x]    Dry  []        []    Cool []    Moist []        []    Hot  []    Diaphoretic []        []    Rash  []    Cyanosis []          N-psych: []    Sedated  []    Agitated []        [x]    Alert  Follows commands:   [x]    Yes  []    No []          Devices: []    ET-Tube []    Tracheostomy []    Chest tube: Air leak? []    Y/[]    N    []    [employer] []    PA Catheter []        []    PICC []     []          DATA:   Current facility-administered medications   Medication Dose Route Frequency   ??? VANCOMYCIN TROUGH LEVEL  1 Each Does Not Apply ONCE   ??? TPN ADULT - PERIPHERAL     IntraVENous CONTINUOUS   ??? DISCONTD: VANCOMYCIN TROUGH LEVEL  1 Each Does Not Apply 1 TIME PRN   ??? lactulose (CHRONULAC) solution 20 g  20 g Oral BID   ??? aspirin chewable tablet 81 mg  81 mg Oral DAILY   ??? levothyroxine (SYNTHROID) injection 25 mcg  25 mcg IntraVENous DAILY   ??? DISCONTD: TPN ADULT - PERIPHERAL     IntraVENous CONTINUOUS   ??? levofloxacin (LEVAQUIN) 750 mg infusion   750 mg IntraVENous Q24H   ??? vancomycin (VANCOCIN) 0.75 g in 0.9% sodium chloride 250 mL IVPB  750 mg IntraVENous Q12H   ??? dextrose 5% infusion    IntraVENous CONTINUOUS   ??? piperacillin-tazobactam (ZOSYN) 3.375 g in 0.9% sodium chloride (MBP/ADV) 100 mL MBP  3.375 g IntraVENous Q6H   ??? enoxaparin (LOVENOX) injection 70 mg  70 mg SubCUTAneous Q12H   ??? albumin human 5% (BUMINATE) solution 25 g  25 g IntraVENous Q4H PRN   ??? metoprolol tartrate (LOPRESSOR) injection 2.5 mg  2.5 mg IntraVENous Q6H   ??? morphine injection 1 mg  1 mg IntraVENous PRN    ??? pantoprazole (PROTONIX) injection 40 mg  40 mg IntraVENous DAILY   ??? albuterol/ipratropium (DUONEB) neb solution   1 Dose Nebulization Q4HWA RT   ??? midazolam (VERSED) injection 2-4 mg  2-4 mg IntraVENous Q1H PRN   ??? sodium chloride (NS) flush 10 mL  10 mL InterCATHeter Q8H   ??? sodium chloride (NS) flush 10 mL  10 mL InterCATHeter PRN   ??? heparin (porcine) pf 300 Units  300  Units InterCATHeter PRN   ??? sodium chloride (NS) flush 20 mL  20 mL InterCATHeter PRN   ??? acetaminophen (TYLENOL) suppository 650 mg  650 mg Rectal Q6H PRN   ??? albuterol (PROVENTIL VENTOLIN) nebulizer solution 2.5 mg  2.5 mg Nebulization Q2H PRN   ??? thiamine (B-1) 100 mg in 0.9% sodium chloride 50 mL IVPB  100 mg IntraVENous DAILY   ??? nitroglycerin (NITROBID) 2 % ointment 2 Inch  2 Inch Topical Q6H PRN   ??? saline peripheral flush 5 mL  5 mL InterCATHeter PRN         Telemetry: [x]    Sinus []    A-flutter []    Paced    []    A-fib []    Multiple PVC???s                  Labs:  Recent Labs   Basename 05/12/09 0400 05/11/09 0410 05/10/09 0550   ??? WBC 9.7 9.2 11.0   ??? HGB 8.0* 8.4* 8.5*   ??? HCT 26.7* 27.7* 28.0*   ??? PLT 56* 58* 67*       Recent Labs   Basename 05/12/09 0400 05/11/09 0410 05/10/09 0340   ??? NA 154* 154* 149*   ??? K 3.7 3.5 3.5   ??? CL 122* 123* 118*   ??? CO2 22 21 22    ??? GLU 153* 131* 114*   ??? BUN 14 14 15    ??? CREA 0.7 0.8 0.8   ??? CA 8.9 8.7 8.7   ??? MG -- 2.0 1.9   ??? PHOS -- -- --   ??? ALB 2.0* 2.0* 2.2*   ??? TBIL 2.7* 3.3* 3.9*   ??? SGOT 27 39* 41*   ??? INR -- -- 1.7*       Recent Labs   Basename 05/09/09 1844 05/09/09 1407   ??? PH 7.41 7.42   ??? PCO2 37 30*   ??? PO2 106* 134*   ??? HCO3 22 19*   ??? FIO2 40 40         Imaging:  [x]    I have personally reviewed the patient???s radiographs  []    Radiographs reviewed with radiologist   [x]    No change from prior, tubes and lines in adequate position  []    Improved   []    Worsening     IMPRESSION:     The patient is: [x]    acutely ill Risk of deterioration: [x]    moderate    []    critically ill  []    high      Patient Active Hospital Problem List:  Nausea with vomiting (05/01/2009)    Hypokalemia (05/01/2009)    Hepatic encephalopathy (05/01/2009)    Gait abnormality (05/01/2009)    Alcoholic cirrhosis of liver (05/01/2009)    Hypomagnesemia (05/01/2009)      ??     PLAN:  1. Wean O2  2. Cont iv abx  3. PUD/DVT prophylaxis  4. PO as tol  5. Transfuse if needed  6. Mobilize  7. Transfer out of ccu     [x]    See my orders for details    My assessment/plan was discussed with:  [x]    nursing []    PT/OT    []    respiratory therapy []    Dr.   []    family []         []    Total critical care time exclusive of procedures       minutes  Gwenyth Bender.  Patsey Berthold, MD

## 2009-05-12 NOTE — Progress Notes (Signed)
Problem: Dysphagia (Adult)  Goal: *Acute Goals and Plan of Care (Insert Text)  Speech Therapy Goals  Initiated 05/10/09  1. Patient will participate in re-evaluation of swallow within 7 days  2. Patient will tolerate least restrictive diet without signs or symptoms of aspiration within 7 days  3. Patient will participate in MBS as needed/appropriate within 7 days   SPEECH LANGUAGE PATHOLOGY DYSPHAGIA TREATMENT    NAME : Breanna Lester AGE: 48 y.o.  GENDER: female  DATE: 05/12/2009  PRIMARY DIAGNOSIS: AMS, hypokalemia, lft upper extremity pain  AMS, hypokalemia, lft upper extremity pain  AMS, hypokalemia, lft upper extremity pain  <principal problem not specified>      SUBJECTIVE:   Patient stated ??? Can I have a ginger ale now?" Laughing at one point in swallowing tx; unsure why. Agreeable to tx. Hoarse vocal quality makes attempts at verbalizations unintelligible.      OBJECTIVE:   Cognitive and Communication Status:  Level of Consciousness: Alert    Dysphagia Treatment:  Oral Assessment:  Oral Assessment  Labial: No impairment  Dentition: Extractions  Oral Hygiene: Thick dried secretions of tongue and lower teeth  Lingual: No impairment    P.O. Trials:  Patient Position: Upright in bed    The patient was given the following:  Consistency Presented: Ice chips;Thin liquid  How Presented: SLP-fed/presented;Spoon    ORAL PHASE:  Bolus Acceptance: No impairment  Bolus Formation/Control: Impaired ; slow to chew ice chips. Appropriate with teaspoon water.  Propulsion: Delayed  Oral Residue: None    PHARYNGEAL PHASE:  Initiation of Swallow: Delayed  Laryngeal Elevation: Functional  Aspiration Signs/Symptoms: Change respiration;Weak cough . RR increased to 31. Patient with wet cough. Expelled thick blood-tinged mucous.  Vocal Quality: Hoarse;Breathy (low volume)  Cues for Modifications: Moderate  Effective Modifications: None  Pain:  Pain Screen  Pain Scale: Numeric (0 - 10)  Pain Intensity: 0   Faces (Wong-Baker) Scale: Hurts even more  Pain Onset: Movement  Pain Location: Arm  Pain Orientation: Left  Pain Intervention(s): Repositioned   After treatment:  [X]    Patient left in NAD  [X]    Call bell left within reach  [ ]    Caregiver present  [ ]    Patient left up in chair, nursing staff notified      ASSESSMENT AND PROGRESSION TOWARD GOALS:   Oropharyngeal dysphagia persist. Continues to become more awake/alert. If alertness continues and respiratory status remains stable, will be appropriate for a MBS in 1-2 days. Strict oral care recommended.    Patient???s progression toward goals is as follows:  [X]    Improving appropriately and progressing toward goals  [ ]    Improving slowly and progressing toward goals  [ ]    Not making progress toward goals and plan of care will be adjusted      PLAN OF CARE:   Patient continues to benefit from skilled intervention to address the above impairments.    Recommendations and Planned Interventions:  dysphagia tx    Any new recommendations or planned interventions including recommended diet changes were discussed with:  [X]   Patient   [ ]    Family      [X]    Nursing  [ ]    Provider  [ ]   Posted safety precautions in patient???s room    Communication/Collaboration:  The patient???s plan of care was discussed with:  Registered Nurse    Reginia Forts, SLP    Time Calculation: 20 mins

## 2009-05-12 NOTE — Progress Notes (Signed)
Cardiology Progress Note      05/12/2009 10:03 AM    Admit Date: 04/29/2009        Subjective:     More awake, sitting up, follows commands.  No c/o.    BP 118/48   Pulse 83   Temp 98 ??F (36.7 ??C)   Resp 23   Ht 5\' 2"  (1.575 m)   Wt 144 lb 13.5 oz (65.7 kg)   SpO2 98%   PF 40 L/min  Current facility-administered medications   Medication Dose Route Frequency   ??? VANCOMYCIN TROUGH LEVEL  1 Each Does Not Apply ONCE   ??? TPN ADULT - PERIPHERAL     IntraVENous CONTINUOUS   ??? DISCONTD: VANCOMYCIN TROUGH LEVEL  1 Each Does Not Apply 1 TIME PRN   ??? lactulose (CHRONULAC) solution 20 g  20 g Oral BID   ??? aspirin chewable tablet 81 mg  81 mg Oral DAILY   ??? levothyroxine (SYNTHROID) injection 25 mcg  25 mcg IntraVENous DAILY   ??? DISCONTD: TPN ADULT - PERIPHERAL     IntraVENous CONTINUOUS   ??? levofloxacin (LEVAQUIN) 750 mg infusion   750 mg IntraVENous Q24H   ??? vancomycin (VANCOCIN) 0.75 g in 0.9% sodium chloride 250 mL IVPB  750 mg IntraVENous Q12H   ??? dextrose 5% infusion    IntraVENous CONTINUOUS   ??? piperacillin-tazobactam (ZOSYN) 3.375 g in 0.9% sodium chloride (MBP/ADV) 100 mL MBP  3.375 g IntraVENous Q6H   ??? enoxaparin (LOVENOX) injection 70 mg  70 mg SubCUTAneous Q12H   ??? albumin human 5% (BUMINATE) solution 25 g  25 g IntraVENous Q4H PRN   ??? metoprolol tartrate (LOPRESSOR) injection 2.5 mg  2.5 mg IntraVENous Q6H   ??? morphine injection 1 mg  1 mg IntraVENous PRN   ??? pantoprazole (PROTONIX) injection 40 mg  40 mg IntraVENous DAILY   ??? albuterol/ipratropium (DUONEB) neb solution   1 Dose Nebulization Q4HWA RT   ??? midazolam (VERSED) injection 2-4 mg  2-4 mg IntraVENous Q1H PRN   ??? sodium chloride (NS) flush 10 mL  10 mL InterCATHeter Q8H   ??? sodium chloride (NS) flush 10 mL  10 mL InterCATHeter PRN   ??? heparin (porcine) pf 300 Units  300 Units InterCATHeter PRN   ??? sodium chloride (NS) flush 20 mL  20 mL InterCATHeter PRN   ??? acetaminophen (TYLENOL) suppository 650 mg  650 mg Rectal Q6H PRN    ??? albuterol (PROVENTIL VENTOLIN) nebulizer solution 2.5 mg  2.5 mg Nebulization Q2H PRN   ??? thiamine (B-1) 100 mg in 0.9% sodium chloride 50 mL IVPB  100 mg IntraVENous DAILY   ??? nitroglycerin (NITROBID) 2 % ointment 2 Inch  2 Inch Topical Q6H PRN   ??? saline peripheral flush 5 mL  5 mL InterCATHeter PRN           Objective:      Physical Exam:  Weak, chronically ill-appearing in NAD  Chest--clear  CV--rrr Nl S1S2  ABD--mild tender  Ext--unchanged    Data Review:   Labs:  Recent Results (from the past 24 hour(s))   GLUCOSE, POC    Collection Time    05/11/09 12:34 PM   Component Value Range   ??? POC GLUCOSE 164 (*) 65 - 105 (mg/dL)   GLUCOSE, POC    Collection Time    05/11/09 11:18 PM   Component Value Range   ??? POC GLUCOSE 161 (*) 65 - 105 (mg/dL)   CBC WITH AUTOMATED  DIFF    Collection Time    05/12/09  4:00 AM   Component Value Range   ??? WBC 9.7  3.6 - 11.0 (K/uL)   ??? RBC 2.91 (*) 3.80 - 5.20 (M/uL)   ??? HGB 8.0 (*) 11.5 - 16.0 (g/dL)   ??? HCT 26.7 (*) 35.0 - 47.0 (%)   ??? MCV 91.8  80.0 - 99.0 (FL)   ??? MCH 27.5  26.0 - 34.0 (PG)   ??? MCHC 30.0  30.0 - 36.5 (g/dL)   ??? RDW 21.0 (*) 11.5 - 14.5 (%)   ??? PLATELET 56 (*) 150 - 400 (K/uL)   ??? NEUTROPHILS 74  32 - 75 (%)   ??? LYMPHOCYTES 17  12 - 49 (%)   ??? MONOCYTES 7  5 - 13 (%)   ??? EOSINOPHILS 2  0 - 7 (%)   ??? BASOPHILS 0  0 - 1 (%)   ??? ABSOLUTE NEUTS 7.2  1.8 - 8.0 (K/UL)   ??? ABSOLUTE LYMPHS 1.6  0.8 - 3.5 (K/UL)   ??? ABSOLUTE MONOS 0.7  0.0 - 1.0 (K/UL)   ??? ABSOLUTE EOSINS 0.2  0.0 - 0.4 (K/UL)   ??? ABSOLUTE BASOS 0.0  0.0 - 0.1 (K/UL)   ??? PLATELET COMMENTS CLUMPED PLATELETS PRESENT     ??? RBC COMMENTS 2+ ANISOCYTOSIS     ??? DF SMEAR SCANNED     METABOLIC PANEL, COMPREHENSIVE    Collection Time    05/12/09  4:00 AM   Component Value Range   ??? Sodium 154 (*) 136 - 145 (MMOL/L)   ??? Potassium 3.7  3.5 - 5.1 (MMOL/L)   ??? Chloride 122 (*) 97 - 108 (MMOL/L)   ??? CO2 22  21 - 32 (MMOL/L)   ??? Anion gap 10  5 - 15 (mmol/L)   ??? Glucose 153 (*) 65 - 100 (MG/DL)   ??? BUN 14  6 - 20 (MG/DL)    ??? Creatinine 0.7  0.6 - 1.3 (MG/DL)   ??? BUN/Creatinine ratio 20  12 - 20 ( )   ??? GFR est AA >60  >60 (ml/min/1.57m2)   ??? GFR est non-AA >60  >60 (ml/min/1.23m2)   ??? Calcium 8.9  8.5 - 10.1 (MG/DL)   ??? Bilirubin, total 2.7 (*) 0.2 - 1.0 (MG/DL)   ??? ALT 52  12 - 78 (U/L)   ??? AST 27  15 - 37 (U/L)   ??? Alk. phosphatase 107  50 - 136 (U/L)   ??? Protein, total 5.7 (*) 6.4 - 8.2 (g/dL)   ??? Albumin 2.0 (*) 3.5 - 5.0 (g/dL)   ??? Globulin 3.7  2.0 - 4.0 (g/dL)   ??? A-G Ratio 0.5 (*) 1.1 - 2.2 ( )   GLUCOSE, POC    Collection Time    05/12/09  5:40 AM   Component Value Range   ??? POC GLUCOSE 148 (*) 65 - 105 (mg/dL)         Telemetry: normal sinus rhythm      Assessment:     Patient Active Hospital Problem List:  Nausea with vomiting (05/01/2009)    Hypokalemia (05/01/2009)    Hepatic encephalopathy (05/01/2009)    Gait abnormality (05/01/2009)    Alcoholic cirrhosis of liver (05/01/2009)    Hypomagnesemia (05/01/2009)    Hypoxic respiratory failure/pneumonia    NSTEMI    Anemia    L arm fracture    Plan:     Slowly improving--continue ASA, lovenox, beta blocker  Add statin at some point after hepatic clears  PT  Nutrition  Ischemic w/u later (stress MPI; do NOT feel cath would be good idea at this point)      Ellwood Sayers, MD

## 2009-05-12 NOTE — Progress Notes (Signed)
Right triple lumen PICC line dressing changed. 4.5cms out.Claiborne Rigg Rn IV Nurse

## 2009-05-12 NOTE — Progress Notes (Signed)
Checked on pt per RD note: NGT has been re-placed but no tfeeds handing and no order yet for enteral nutrition.  SLP working w/ pt.    Nutrition following.  Recommendations in for formula and rate.  Maudie Flakes, PhD, RD, CNSD

## 2009-05-12 NOTE — Progress Notes (Signed)
Problem: Self Care Deficits Care Plan (Adult)  Goal: *Acute Goals and Plan of Care (Insert Text)  Occupational Therapy Goals Initiated 05/11/09    1. Pt will complete grooming in supported position with minimal assistance within 7 days.  2. Pt will increase fine motor skills to open task containers (toothpaste, lotion, ect) with set up assist within 7 days.  3. When appropriate, transfer training in preparation for functional mobility with be initiated and appropriate goal will be set.  4. Pt will tolerated x 10 min edge of bed with minimal assistance while completing dynamic balance activity within 7 days.  5. Pt will move from supine to sit with minimal assistance within 7 days, in preparation for functional mobility.   Outcome: Progressing Towards Goal  OCCUPATIONAL THERAPY TREATMENT    NAME: Breanna Martes AGE: 48 y.o.  GENDER: female  DATE: 05/12/2009  PRIMARY DIAGNOSIS: AMS, hypokalemia, lft upper extremity pain  AMS, hypokalemia, lft upper extremity pain  AMS, hypokalemia, lft upper extremity pain  <principal problem not specified>  Precautions: Standard, fall, contact        OBJECTIVE DATA SUMMARY:   Cognitive/Behavioral Status:  Level of Consciousness: Alert  Orientation Level: Oriented X4  Cognition: Follows commands (slow processing)  Safety/Judgement: Not assessed  Functional Mobility and Transfers for ADLs:  Bed Mobility:  Overall level of assistance required: max assist  Transfers:  Sit to Stand: Additional time;Assist X2;Total assistance, patient performs <25%  Bed to Chair:  (not tested)              Toilet Transfer :  (flexi-seal and foley in place)                 Balance:  Sitting: Impaired;With support  Sitting - Static: Poor (constant support) (anterior pelvic tilt with decreased trunk extension due to )  Sitting - Dynamic: Poor (constant support)  Standing: With support;Without support (Poor)  ADL Intervention:   In preparation for functional mobility, pt practiced rolling onto side and bringing legs off of bed.  Pt able to bring B LE off of bed without assistance. Hand over hand assist to reach R UE to L grabbar.  Unable to maintain grasp, so max assist to roll. Pt able to assist 2??  positioning of legs off bed, but >25% assistance.     Pt and therapist set mini-goal that by next treatment session, patient will increase active R UE ROM to reach and touch L grab bar.  Pt nodded "yes," when asked if it was a goal she could achieve.    Pain:  Pain Scale: Verbal  Pain Intensity: 0    Activity Tolerance:  Vitals Assessment 1:  Patient Position : Pre-activity;Supine  BP: 117/56 mmHg  Pulse (Heart Rate): 85   O2 Sat (%): 98 %  O2 Device: Room air  O2 Flow Rate (L/min): 2 l/min    Vitals Assessment 2:  Patient Position : During activity;Sitting  BP 2: 108/52 mmHg  O2 Sat (%):  O2 Device:  O2 Flow Rate (L/min):   Vitals Assessment 3:  O2 Sat (%):  O2 Device:  O2 Flow Rate (L/min): Vitals Assessment 4:  O2 Sat (%):  O2 Device:  O2 Flow Rate (L/min):     After treatment:  [X]    Patient left in NAD  [X]    Call bell left within reach  [ ]    Caregiver present  [X]    Patient left up in chair, nursing staff notified      ASSESSMENT  AND PROGRESSION TOWARD GOALS:   Pt will increased LE movement against gravity this session, and followed 100% verbal commands.  Engaged and motivated, but remains very weak, especially UE.    Patient???s progression toward goals is as follows:  [X]    Improving appropriately and progressing toward goals  [ ]    Improving slowly and progressing toward goals  [ ]    Not making progress toward goals and plan of care will be adjusted      PLAN OF CARE:   Patient continues to benefit from skilled intervention to address the above impairments.  Continue treatment per established plan of care.  Planned Interventions:  [X]    Self Care Training                    [X]     Therapeutic Activities   [X]    Functional Mobility Training     [ ]     Cognitive Retraining  [X]    Therapeutic Exercises              [X]     Endurance Activities  [X]    Balance Training                       [X]     Neuromuscular Re-education  [X]    Patient Education                      [ ]     Family Training/Education  [ ]    Visual/Perceptual Training         [X]     Home safety training  [ ]    Other (Comment):  Discharge Recommendations:  [X]    To be determined                                 [ ]     Day rehabilitation program  [ ]    Home without services  (home safety education provided)  [ ]    Home with Home Health services  (home safety education provided)  [ ]   Outpatient therapy                                  [ ]     Nursing facility for rehab  [X]    Inpatient Rehab Hospital                      [ ]     Nursing facility for long term care  [ ]    Other:  Communication/Collaboration:  The patient???s plan of care was discussed with:  Registered Nurse    Mayer Camel, OTR/L  Time Calculation: 18 mins

## 2009-05-12 NOTE — Progress Notes (Signed)
Report given to oncoming RN, Jamesetta So

## 2009-05-12 NOTE — Progress Notes (Signed)
Ortho-    Notes reviewed, pt remains obtunded and confused S/P distal radius/ulna fx on Sept 7th.  Pt's fx is in acceptable alignment on last x-ray, will re-check towards end of week or early next week.  Most likely will treat non operatively.  Maintain NWB on L UE.    Karie Georges. Janann August, MD

## 2009-05-12 NOTE — Progress Notes (Signed)
Follow up visit with patient who was alert and attempted to talk. RN Jamesetta So was present.  Pt seemed to understand and respond to inquiry of being "baptist".  Pt attempted to communicate, chaplain was unable to understand.  Prayer was offered and patient accepted with nod of her head.  RN, chaplain and patient prayed together.  Pt appears to have a husband, but I am unsure of any other family members.  Pt attempted to try to talk, but I could not understand.  Prayed with pt.  Visit by: Rev. Rakeb Kibble L. Dockum, D.Min, MA, PRN Chaplain

## 2009-05-12 NOTE — Progress Notes (Signed)
Problem: Mobility Impaired (Adult)  Goal: *Acute Goals and Plan of Care (Insert Text)  Physical Therapy Goals  Initiated 05/12/2009  1. Patient will move from supine to sit and sit to supine , scoot up and down and roll side to side in bed with moderate assistance within 7 day(s).   2. Patient will transfer from bed to chair and chair to bed with moderate assistance using the least restrictive device within 7 day(s).  3. Patient will perform sit to stand with moderate assistance within 7 day(s).  4. Patient will be able to sit at EOB x 5 minutes while maintaining midline with minimum assist within 7 days.   5. Patient will perform 1 sets of 15 repetitions of active strengthening exercises for bilateral upper and lower extremity(s) with supervision/set-up within 7 day(s).   PHYSICAL THERAPY EVALUATION    NAME: Breanna Lester AGE: 48 y.o.  GENDER: female  DATE: 05/12/2009  PRIMARY DIAGNOSIS AND MEDICAL HISTORY: AMS, hypokalemia, lft upper extremity pain  AMS, hypokalemia, lft upper extremity pain  AMS, hypokalemia, lft upper extremity pain  <principal problem not specified>      Past Medical History   Diagnosis Date   ??? Liver disease 2009       liver failure   ??? Depression     ??? Anxiety       Past Surgical History   Procedure Date   ??? Hx tonsillectomy 1972   ??? Hx adenoidectomy 1972   ??? Hx cesarean section 1986 and 1993   ??? Hx hysterectomy 2008   ??? Hx dilation and curettage 1999     Problem List Never Reviewed       Class     Nausea with vomiting [787.01]             Hypokalemia [276.8A]             Hepatic encephalopathy [572.2]             Gait abnormality [781.2N]             Alcoholic cirrhosis of liver [571.2]             Hypomagnesemia [275.2B]                     Prior Level of Function/Home Situation: unable to determine, pt with incomprehensible speech  Home Situation  Home Environment: Private residence  # Steps to Enter: 4  One/Two Story Residence: Two story (bedroom on second level)   Living Alone: No (lives with husband and two adult daughters)  Support Systems: Family member(s)  Patient Expects to be Discharged to:: Rehabilitation facility  Current DME Used/Available at Home: Cane, straight     Precautions: contact, NWB on LUE due to unstable wrist fx that needs to be repaired once pt is medically stable, NG tube, rectal tube      SUBJECTIVE:   Patient stated : incomprehensible speech      OBJECTIVE DATA SUMMARY:   Critical Behavior:  Level of Consciousness: Alert  but confused.  Orientation Level: Oriented X4  Cognition: Decreased attention/concentration;Impaired decision making;Poor safety awareness ; 25% of command following with additional time due to slow processing  Safety/Judgement: Not assessed  S kin: intact  Strength:   Moderate to severe general debilitation  Range Of Motion:  Generally decreased but functional  Functional Mobility:  Bed Mobility:  Overall level of assistance required: total assist with additional time  Transfers:  Sit to Stand: Additional time;Assist X2;Total  assistance, patient performs <25%  Bed to Chair:  (not tested)  Balance:   Sitting: Impaired;With support  Sitting - Static: Poor (constant support) (anterior pelvic tilt with decreased trunk extension due to weakness; posterior lean )  Sitting - Dynamic: Poor (constant support)  Standing: With support;Without support (Poor)        Pain: Per nrsg  Pain Scale: Numeric (0 - 10)  Pain Intensity: 0  Pain Location: Arm  Activity Tolerance:  Vitals Assessment 1:  Patient Position : Pre-activity;Supine  BP: 118/48 mmHg  Pulse (Heart Rate): 83   O2 Sat (%): 98 %with RA        [X]   Fall prevention education was provided and the patient/caregiver indicated understanding.  After treatment:  [X]   Patient left in no apparent distress  [X]   Call bell left within reach  [ ]   Caregiver present  [ ]   Patient left up in chair, nursing staff notified      ASSESSMENT:    Based on the objective data described above, the patient presents with  decreased cognition with only 25% command following and slow processing. Pt was a total assist with bed mobility and transfer due to moderate to severe general debilitation but was initiating movement. Too unsafe to put pt in chair due to poor balance.    Patient will benefit from skilled intervention to address the above impairments.  Patient???s rehabilitation potential is considered to be Fair  Factors which may influence rehabilitation potential include:  [ ]   None noted  [X]   Mental ability/status  [ ]   Medical condition  [ ]   Home/family situation and support systems  [ ]   Safety awareness  [ ]   Pain tolerance/management  [ ]   Other:     PLAN OF CARE:   Recommendations and Planned Interventions:  [X]   Bed Mobility Training                     [ ]   Neuromuscular Re-education  [X]   Transfer Training                          [ ]   Orthotic/Prosthetic Training  [X]   Gait Training                                 [ ]   Modalities  [X]   Therapeutic Exercises                 [ ]   Edema Management/Control  [X]   Therapeutic Activities                   [X]   Patient and Family Training/Education  [ ]   Other (comment):  Frequency/Duration:  Patient will be followed by physical therapy 5 times a week  to address goals.  Discharge Recommendations:  [X]   To be determined                         [ ]   Day rehabilitation program  [ ]   Home without services                  [ ]   Outpatient therapy  [ ]   Home with Home Health services [ ]   Nursing facility for rehab  [ ]   Inpatient [facility]                [ ]   Nursing facility for long term care  [ ]   Other:  Further Equipment Recommendations for Discharge: TBD  Communication/Collaboration:  [ ]   Patient/family have participated as able in goal setting and plan of care.  [ ]   Patient/family agree to work toward stated goals and plan of care.   [ ]   Patient understands intent and goals of therapy, but is neutral about his/her participation.  [X]   Patient is unable to participate in goal setting and plan of care.  The patient???s plan of care was discussed with:  Registered Nurse.    Thank you for this referral.  Wendall Mola, PT, DPT  Time Calculation: 11 mins

## 2009-05-12 NOTE — Progress Notes (Signed)
Hospitalist Progress Note         NAME: Breanna Lester        DOB:  1960/12/30        MRN:  [id number]      Assessment & Plan   Sepsis syndrome :underlying pneumonia, fever, leukocytosis  ????????????????????????Broad spectrum antibiotics.  ????????????????????????No evidence of UTI or ascites(SBP), has an umbilical hernia that is not     reducible but overall adbomen is soft, has RUQ pain, CT gallbladder sludge and thickenned wall, no fever 24h    Acute respiratory failure:got extubated doing well off the ventilator    Altered mental status : multifactorial Sepsis, hypernatremia, ARF, hyperamonemia   has improved AMS, trying to communicate now     Hypernatremia: stable, will increase hypotonic IVF.    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     Review of Systems:  Fever/chills y   Cough N   Sputum    N   SOB/DOE  N   Chest Pain N   Abdominal Pain y   Diarrhea N   Constipation N   Nausea/Vomiting N   Dysuria N   Tolerating PT N   Tolerating Diet N   Could not obtain due to AMS vs intubation         Objective:    Patient extubated, critically ill, sedation was D/C more awake today , follow commands, decision for PEG was revoked patient on TPN, I will await before restarting enteral feed patient c/o RUQ pain    VITALS:   Last 24hrs VS reviewed since prior progress note. Most recent are:  Visit Vitals   Item Reading   ??? BP 119/51   ??? Pulse 78   ??? Temp 98.1 ??F (36.7 ??C)   ??? Resp 21   ??? Ht 5\' 2"  (1.575 m)   ??? Wt 144 lb 13.5 oz (65.7 kg)   ??? SpO2 98%   ??? PF 40L/min           Intake/Output Summary (Last 24 hours) at 05/12/09 1425  Last data filed at 05/12/09 1200   Gross per 24 hour   Intake   4247 ml   Output   1830 ml   Net   2417 ml        Telemetry Reviewed:     PHYSICAL EXAM:  General: Extubated, awake ,   HEENT: Pupils unequal, R 3mm, L 2mm  Lungs:  Bilateral rales, ronchi   Heart:  Regular rhythm, tachycardic, SEM  Abdomen: Soft, Non distended, hypoactive BS,RUQ pain,  no rebound, umbilical hernia non reducible,  Extremities: No edema, pulses present, brace in  left arm  Neurologic:?? GCS [id number], no gross deficit  Psych:???? AMS    Lab Data Reviewed: (see below)    Medications Reviewed: (see below)    PMH/SH reviewed - no change compared to H&P  ______________________________________________________________________        Care Plan discussed with:  Patient x   Family    RN x   Care Manager    Consultant/Specialist      >50% of visit spent in counseling and coordination of care       Prophylaxis:  GI PPI   DVT lovenox     Disposition:   Home with Family    HH/PT/OT/RN    SNF/LTC x   [facility]      ______________________________________________________________________  Attending Physician: Ayesha Rumpf, MD   _____________________________________________________________________________________________________  Procedures: see electronic medical records for all procedures/Xrays and details  which were not copied into this note but were reviewed prior to creation of Plan.      LABS:  Recent Labs   Basename 05/12/09 0400 05/11/09 0410    ??? WBC 9.7 9.2   ??? HGB 8.0* 8.4*   ??? HCT 26.7* 27.7*   ??? PLT 56* 58*         Recent Labs   Basename 05/12/09 0400 05/11/09 0410 05/10/09 0340   ??? NA 154* 154* 149*   ??? K 3.7 3.5 3.5   ??? CL 122* 123* 118*   ??? CO2 22 21 22    ??? BUN 14 14 15    ??? CREA 0.7 0.8 0.8   ??? GLU 153* 131* 114*   ??? CA 8.9 8.7 8.7   ??? MG -- 2.0 1.9   ??? PHOS -- -- --   ??? URICA -- -- --         Recent Labs   Basename 05/12/09 0400 05/11/09 0410 05/10/09 0340   ??? SGOT 27 39* 41*   ??? GPT 52 66 71   ??? AP 107 113 112   ??? TBIL 2.7* 3.3* 3.9*   ??? TP 5.7* 5.7* 5.6*   ??? ALB 2.0* 2.0* 2.2*   ??? GLOB 3.7 3.7 3.4   ??? GGT -- -- --   ??? AML -- -- --   ??? LPSE -- -- --         Recent Labs   Basename 05/10/09 0340   ??? INR 1.7*   ??? PTP 16.7*   ??? APTT --          No results found for this basename: FE:2,TIBC:2,PSAT:2,FERR:2, in the last 72 hours   Recent Labs   Basename 05/09/09 1844   ??? PH 7.41   ??? PCO2 37   ??? PO2 106*           No results found for this basename: CPK:3,CKMB:3,TROPONINI:3, in the last 72 hours    Lab Results   Component Value Date/Time    POC GLUCOSE 140 05/12/2009 11:39 AM    POC GLUCOSE 148 05/12/2009  5:40 AM    POC GLUCOSE 161 05/11/2009 11:18 PM    POC GLUCOSE 164 05/11/2009 12:34 PM    POC GLUCOSE 157 05/11/2009  6:58 AM         Lab Results   Component Value Date/Time    Color DARK YELLOW 05/09/2009 11:05 AM    Appearance CLEAR 05/09/2009 11:05 AM    Specific gravity 1.010 10/29/2008 11:00 AM    Specific gravity 1.024 05/09/2009 11:05 AM    pH 6.0 05/09/2009 11:05  AM    Protein TRACE 05/09/2009 11:05 AM    Glucose NEGATIVE  05/09/2009 11:05 AM    Ketone NEGATIVE  05/09/2009 11:05 AM    Bilirubin NEGATIVE  04/30/2009 12:14 AM    Urobilinogen 0.2 05/09/2009 11:05 AM    Nitrites NEGATIVE  05/09/2009 11:05 AM    Leukocyte Esterase SMALL 05/09/2009 11:05 AM    Epithelial cells 0-5 05/09/2009 11:05 AM    Bacteria NEGATIVE  05/09/2009 11:05 AM    WBC 5-10 05/09/2009 11:05 AM    RBC 10-20 05/09/2009 11:05 AM           MEDICATIONS:  Current facility-administered medications    Medication Dose Route Frequency   ??? sodium chloride (NS) 0.9 % flush        ??? VANCOMYCIN TROUGH LEVEL  1 Each Does Not Apply ONCE   ??? TPN ADULT - PERIPHERAL     IntraVENous CONTINUOUS   ??? DISCONTD: VANCOMYCIN TROUGH LEVEL  1 Each Does Not Apply 1 TIME PRN   ??? lactulose (CHRONULAC) solution 20 g  20 g Oral BID   ??? aspirin chewable tablet 81 mg  81 mg Oral DAILY   ??? levothyroxine (SYNTHROID) injection 25 mcg  25 mcg IntraVENous DAILY   ??? DISCONTD: TPN ADULT - PERIPHERAL     IntraVENous CONTINUOUS   ??? levofloxacin (LEVAQUIN) 750 mg infusion   750 mg IntraVENous Q24H   ??? vancomycin (VANCOCIN) 0.75 g in 0.9% sodium chloride 250 mL IVPB  750 mg IntraVENous Q12H   ??? dextrose 5% infusion    IntraVENous CONTINUOUS   ??? piperacillin-tazobactam (ZOSYN) 3.375 g in 0.9% sodium chloride (MBP/ADV) 100 mL MBP  3.375 g IntraVENous Q6H   ??? enoxaparin (LOVENOX) injection 70 mg  70 mg SubCUTAneous Q12H   ??? albumin human 5% (BUMINATE) solution 25 g  25 g IntraVENous Q4H PRN   ??? metoprolol tartrate (LOPRESSOR) injection 2.5 mg  2.5 mg IntraVENous Q6H   ??? morphine injection 1 mg  1 mg IntraVENous PRN   ??? pantoprazole (PROTONIX) injection 40 mg  40 mg IntraVENous DAILY   ??? albuterol/ipratropium (DUONEB) neb solution   1 Dose Nebulization Q4HWA RT   ??? midazolam (VERSED) injection 2-4 mg  2-4 mg IntraVENous Q1H PRN   ??? sodium chloride (NS) flush 10 mL  10 mL InterCATHeter Q8H   ??? sodium chloride (NS) flush 10 mL  10 mL InterCATHeter PRN   ??? heparin (porcine) pf 300 Units  300 Units InterCATHeter PRN   ??? sodium chloride (NS) flush 20 mL  20 mL InterCATHeter PRN   ??? acetaminophen (TYLENOL) suppository 650 mg  650 mg Rectal Q6H PRN   ??? albuterol (PROVENTIL VENTOLIN) nebulizer solution 2.5 mg  2.5 mg Nebulization Q2H PRN   ??? thiamine (B-1) 100 mg in 0.9% sodium chloride 50 mL IVPB  100 mg IntraVENous DAILY   ??? nitroglycerin (NITROBID) 2 % ointment 2 Inch  2 Inch Topical Q6H PRN   ??? saline peripheral flush 5 mL  5 mL InterCATHeter PRN

## 2009-05-12 NOTE — Progress Notes (Signed)
Report called to [facility] for transfer to [id number], transferred via bed with monitor without incident.

## 2009-05-12 NOTE — Progress Notes (Signed)
TRANSFER - IN REPORT:    Verbal report received from [place], RN on [name]  being received from CCU for routine progression of care      Report consisted of patient???s Situation, Background, Assessment and   Recommendations(SBAR).     Information from the following report(s) SBAR, [facility], [facility] and Recent Results was reviewed with the receiving nurse.    Opportunity for questions and clarification was provided.      Assessment completed upon patient???s arrival to unit and care assumed.

## 2009-05-13 LAB — CBC WITH MANUAL DIFF
ABS. BASOPHILS: 0 10*3/uL (ref 0.0–0.1)
ABS. EOSINOPHILS: 0.1 10*3/uL (ref 0.0–0.4)
ABS. LYMPHOCYTES: 2.3 10*3/uL (ref 0.8–3.5)
ABS. MONOCYTES: 0 10*3/uL (ref 0.0–1.0)
ABS. NEUTROPHILS: 7.2 10*3/uL (ref 1.8–8.0)
ABSOLUTE NRBC: 0 10*3/uL (ref 0.00–0.01)
BAND NEUTROPHILS: 3 % (ref 0–6)
BASOPHILS: 0 % (ref 0–1)
BLASTS: 0 %
EOSINOPHILS: 1 % (ref 0–7)
HCT: 26.9 % — ABNORMAL LOW (ref 35.0–47.0)
HGB: 8.1 g/dL — ABNORMAL LOW (ref 11.5–16.0)
LYMPHOCYTES: 24 % (ref 12–49)
MCH: 27.6 PG (ref 26.0–34.0)
MCHC: 30.1 g/dL (ref 30.0–36.5)
MCV: 91.5 FL (ref 80.0–99.0)
METAMYELOCYTES: 0 %
MONOCYTES: 0 % — ABNORMAL LOW (ref 5–13)
MYELOCYTES: 0 %
NEUTROPHILS: 72 % (ref 32–75)
NRBC: 0 PER 100 WBC
OTHER CELL: 0
PLATELET: 84 10*3/uL — ABNORMAL LOW (ref 150–400)
PROMYELOCYTES: 0 %
RBC: 2.94 M/uL — ABNORMAL LOW (ref 3.80–5.20)
RDW: 21.6 % — ABNORMAL HIGH (ref 11.5–14.5)
WBC: 9.6 10*3/uL (ref 3.6–11.0)

## 2009-05-13 LAB — GLUCOSE, POC
Glucose (POC): 154 mg/dL — ABNORMAL HIGH (ref 65–105)
Glucose (POC): 164 mg/dL — ABNORMAL HIGH (ref 65–105)
Glucose (POC): 144 mg/dL — ABNORMAL HIGH (ref 65–105)
Glucose (POC): 132 mg/dL — ABNORMAL HIGH (ref 65–105)
Glucose (POC): 138 mg/dL — ABNORMAL HIGH (ref 65–105)

## 2009-05-13 LAB — METABOLIC PANEL, COMPREHENSIVE
A-G Ratio: 0.5 — ABNORMAL LOW (ref 1.1–2.2)
ALT (SGPT): 47 U/L (ref 12–78)
AST (SGOT): 26 U/L (ref 15–37)
Albumin: 1.9 g/dL — ABNORMAL LOW (ref 3.5–5.0)
Alk. phosphatase: 95 U/L (ref 50–136)
Anion gap: 10 mmol/L (ref 5–15)
BUN/Creatinine ratio: 19 (ref 12–20)
BUN: 13 MG/DL (ref 6–20)
Bilirubin, total: 2.3 MG/DL — ABNORMAL HIGH (ref 0.2–1.0)
CO2: 21 MMOL/L (ref 21–32)
Calcium: 9.1 MG/DL (ref 8.5–10.1)
Chloride: 119 MMOL/L — ABNORMAL HIGH (ref 97–108)
Creatinine: 0.7 MG/DL (ref 0.6–1.3)
GFR est AA: >60 mL/min/{1.73_m2} (ref >60)
GFR est non-AA: >60 mL/min/{1.73_m2} (ref >60)
Globulin: 3.8 g/dL (ref 2.0–4.0)
Glucose: 154 MG/DL — ABNORMAL HIGH (ref 65–100)
Potassium: 3.5 MMOL/L (ref 3.5–5.1)
Protein, total: 5.7 g/dL — ABNORMAL LOW (ref 6.4–8.2)
Sodium: 150 MMOL/L — ABNORMAL HIGH (ref 136–145)

## 2009-05-13 LAB — VANCOMYCIN, TROUGH: Vancomycin,trough: 12.8 ug/ml — ABNORMAL HIGH (ref 5.0–10.0)

## 2009-05-13 MED ADMIN — influenza vaccine tr-s 10 (PF) (FLUZONE) injection 0.5 mL: INTRAMUSCULAR | @ 21:00:00 | NDC 33332001001

## 2009-05-13 MED ADMIN — enoxaparin (LOVENOX) injection 70 mg: SUBCUTANEOUS | @ 12:00:00 | NDC 00075062430

## 2009-05-13 MED ADMIN — enoxaparin (LOVENOX) injection 70 mg: SUBCUTANEOUS | @ 02:00:00 | NDC 00075062430

## 2009-05-13 MED ADMIN — metoprolol tartrate (LOPRESSOR) injection 2.5 mg: INTRAVENOUS | @ 04:00:00 | NDC 00143987310

## 2009-05-13 MED ADMIN — morphine injection 1 mg: INTRAVENOUS | @ 14:00:00 | NDC 00409176230

## 2009-05-13 MED ADMIN — VANCOMYCIN TROUGH LEVEL: @ 05:00:00 | NDC 00074161405

## 2009-05-13 MED ADMIN — TPN ADULT - PERIPHERAL: INTRAVENOUS | @ 22:00:00 | NDC 87701089928

## 2009-05-13 MED ADMIN — lactulose (CHRONULAC) solution 20 g: ORAL | @ 02:00:00 | NDC 00121457730

## 2009-05-13 MED ADMIN — levothyroxine (SYNTHROID) injection 25 mcg: INTRAVENOUS | @ 14:00:00 | NDC 63323024710

## 2009-05-13 MED ADMIN — aspirin chewable tablet 81 mg: ORAL | @ 14:00:00

## 2009-05-13 MED ADMIN — TPN ADULT - PERIPHERAL: INTRAVENOUS | @ 21:00:00 | NDC 00517293025

## 2009-05-13 MED ADMIN — albuterol/ipratropium (DUONEB) neb solution: RESPIRATORY_TRACT | @ 15:00:00 | NDC 00487980101

## 2009-05-13 MED ADMIN — morphine injection 1 mg: INTRAVENOUS | @ 21:00:00 | NDC 00409176230

## 2009-05-13 MED ADMIN — dextrose 5% infusion: INTRAVENOUS | @ 21:00:00 | NDC 00409792209

## 2009-05-13 MED ADMIN — sodium chloride (NS) flush 10 mL: @ 17:00:00 | NDC 87701099893

## 2009-05-13 MED ADMIN — vancomycin (VANCOCIN) 1 g in 0.9% sodium chloride 250 mL IVPB: INTRAVENOUS | @ 18:00:00 | NDC 63323031461

## 2009-05-13 MED ADMIN — piperacillin-tazobactam (ZOSYN) 3.375 g in 0.9% sodium chloride (MBP/ADV) 100 mL MBP: INTRAVENOUS | @ 10:00:00 | NDC 00338055318

## 2009-05-13 MED ADMIN — levofloxacin (LEVAQUIN) 750 mg infusion: INTRAVENOUS | @ 14:00:00 | NDC 50458016601

## 2009-05-13 MED ADMIN — pantoprazole (PROTONIX) injection 40 mg: INTRAVENOUS | @ 14:00:00 | NDC 00409488810

## 2009-05-13 MED ADMIN — thiamine (B-1) 100 mg in 0.9% sodium chloride 50 mL IVPB: INTRAVENOUS | @ 14:00:00 | NDC 63323001302

## 2009-05-13 MED ADMIN — albuterol/ipratropium (DUONEB) neb solution: RESPIRATORY_TRACT | @ 19:00:00 | NDC 00487980101

## 2009-05-13 MED ADMIN — piperacillin-tazobactam (ZOSYN) 3.375 g in 0.9% sodium chloride (MBP/ADV) 100 mL MBP: INTRAVENOUS | @ 21:00:00 | NDC 00338055318

## 2009-05-13 MED ADMIN — sodium chloride (NS) flush 10 mL: @ 10:00:00 | NDC 87701099893

## 2009-05-13 MED ADMIN — piperacillin-tazobactam (ZOSYN) 3.375 g in 0.9% sodium chloride (MBP/ADV) 100 mL MBP: INTRAVENOUS | @ 16:00:00 | NDC 00338055318

## 2009-05-13 MED ADMIN — vancomycin (VANCOCIN) 0.75 g in 0.9% sodium chloride 250 mL IVPB: INTRAVENOUS | @ 06:00:00 | NDC 63323031461

## 2009-05-13 MED ADMIN — lactulose (CHRONULAC) solution 20 g: ORAL | @ 14:00:00 | NDC 00121457730

## 2009-05-13 MED ADMIN — albuterol/ipratropium (DUONEB) neb solution: RESPIRATORY_TRACT | @ 12:00:00 | NDC 00487980101

## 2009-05-13 MED ADMIN — piperacillin-tazobactam (ZOSYN) 3.375 g in 0.9% sodium chloride (MBP/ADV) 100 mL MBP: INTRAVENOUS | @ 22:00:00 | NDC 81284015110

## 2009-05-13 MED ADMIN — metoprolol (LOPRESSOR) tablet 25 mg: ORAL | @ 14:00:00 | NDC 51079025501

## 2009-05-13 MED ADMIN — piperacillin-tazobactam (ZOSYN) 3.375 g in 0.9% sodium chloride (MBP/ADV) 100 mL MBP: INTRAVENOUS | @ 04:00:00 | NDC 00338055318

## 2009-05-13 MED ADMIN — dextrose 5% infusion: INTRAVENOUS | @ 08:00:00 | NDC 00409792209

## 2009-05-13 MED ADMIN — metoprolol tartrate (LOPRESSOR) injection 2.5 mg: INTRAVENOUS | @ 10:00:00 | NDC 00143987310

## 2009-05-13 MED ADMIN — sodium chloride (NS) flush 10 mL: @ 03:00:00 | NDC 87701099893

## 2009-05-13 MED FILL — LOVENOX 40 MG/0.4 ML SUBCUTANEOUS SYRINGE: 40 mg/0.4 mL | SUBCUTANEOUS | Qty: 1

## 2009-05-13 MED FILL — SALINE FLUSH INJECTION SYRINGE: INTRAMUSCULAR | Qty: 20

## 2009-05-13 MED FILL — SALINE FLUSH INJECTION SYRINGE: INTRAMUSCULAR | Qty: 40

## 2009-05-13 MED FILL — LACTULOSE 10 GRAM/15 ML ORAL SOLN: 10 gram/15 mL | ORAL | Qty: 30

## 2009-05-13 MED FILL — ZOSYN 3.375 GRAM INTRAVENOUS SOLUTION: 3.375 gram | INTRAVENOUS | Qty: 3.38

## 2009-05-13 MED FILL — METOPROLOL TARTRATE 5 MG/5 ML IV SOLN: 5 mg/ mL | INTRAVENOUS | Qty: 5

## 2009-05-13 MED FILL — ALBUTEROL SULFATE 0.083 % (0.83 MG/ML) SOLN FOR INHALATION: 2.5 mg /3 mL (0.083 %) | RESPIRATORY_TRACT | Qty: 1

## 2009-05-13 MED FILL — VANCOMYCIN 10 GRAM IV SOLR: 10 gram | INTRAVENOUS | Qty: 0.75

## 2009-05-13 MED FILL — SALINE FLUSH INJECTION SYRINGE: INTRAMUSCULAR | Qty: 30

## 2009-05-13 MED FILL — AMINOSYN II 10 % IV: 10 % | INTRAVENOUS | Qty: 685.1

## 2009-05-13 MED FILL — SALINE FLUSH INJECTION SYRINGE: INTRAMUSCULAR | Qty: 10

## 2009-05-13 MED FILL — THIAMINE 100 MG/ML INJECTION: 100 mg/mL | INTRAMUSCULAR | Qty: 1

## 2009-05-13 MED FILL — SODIUM CHLORIDE 0.9 % INJECTION: INTRAMUSCULAR | Qty: 10

## 2009-05-13 MED FILL — AFLURIA 2010-2011(PF) 45 MCG (15 MCG X 3)/0.5 ML INTRAMUSCULAR SYRINGE: 45 mcg (15 mcg x 3)/0.5 mL | INTRAMUSCULAR | Qty: 0.5

## 2009-05-13 MED FILL — METOPROLOL TARTRATE 25 MG TAB: 25 mg | ORAL | Qty: 1

## 2009-05-13 MED FILL — LEVOTHYROXINE 200 MCG IJ SOLR: 200 mcg | INTRAVENOUS | Qty: 5

## 2009-05-13 MED FILL — MORPHINE 2 MG/ML INJECTION: 2 mg/mL | INTRAMUSCULAR | Qty: 1

## 2009-05-13 MED FILL — ASPIRIN 81 MG CHEWABLE TAB: 81 mg | ORAL | Qty: 1

## 2009-05-13 NOTE — Progress Notes (Signed)
 Formatting of this note is different from the original.  Cardiology Progress Note    05/13/2009 9:39 AM    Admit Date: 04/29/2009    Admit Diagnosis: AMS, hypokalemia, lft upper extremity pain  AMS, hypokalemia, lft upper extremity pain  AMS, hypokalemia, lft upper extremity pain    Subjective:     More awake, speaking, following commands.  Moved out of ICU to 3rd floor.  No CV complaints.    BP 131/66  Pulse 76  Temp 98.3 F (36.8 C)  Resp 18  Ht 5' 2 (1.575 m)  Wt 144 lb 13.5 oz (65.7 kg)  SpO2 93%  PF 40 L/min  Current facility-administered medications   Medication Dose Route Frequency   ? enoxaparin  (LOVENOX ) injection 40 mg  40 mg SubCUTAneous Q24H   ? metoprolol  (LOPRESSOR ) tablet 25 mg  25 mg Oral Q12H   ? VANCOMYCIN TROUGH LEVEL  1 Each Does Not Apply ONCE   ? sodium chloride  (NS) 0.9 % flush        ? dextrose 5% infusion    IntraVENous CONTINUOUS   ? TPN ADULT - PERIPHERAL     IntraVENous CONTINUOUS   ? DISCONTD: PHARMACY TEST ORDER  1 Each Does Not Apply ONCE   ? TPN ADULT - PERIPHERAL     IntraVENous CONTINUOUS   ? lactulose (CHRONULAC) solution 20 g  20 g Oral BID   ? aspirin chewable tablet 81 mg  81 mg Oral DAILY   ? levothyroxine (SYNTHROID) injection 25 mcg  25 mcg IntraVENous DAILY   ? levofloxacin (LEVAQUIN) 750 mg infusion   750 mg IntraVENous Q24H   ? vancomycin (VANCOCIN) 0.75 g in 0.9% sodium chloride  250 mL IVPB  750 mg IntraVENous Q12H   ? DISCONTD: dextrose 5% infusion    IntraVENous CONTINUOUS   ? piperacillin-tazobactam (ZOSYN) 3.375 g in 0.9% sodium chloride  (MBP/ADV) 100 mL MBP  3.375 g IntraVENous Q6H   ? DISCONTD: enoxaparin  (LOVENOX ) injection 70 mg  70 mg SubCUTAneous Q12H   ? albumin human 5% (BUMINATE) solution 25 g  25 g IntraVENous Q4H PRN   ? DISCONTD: metoprolol  tartrate (LOPRESSOR ) injection 2.5 mg  2.5 mg IntraVENous Q6H   ? morphine  injection 1 mg  1 mg IntraVENous PRN   ? pantoprazole  (PROTONIX ) injection 40 mg  40 mg IntraVENous DAILY   ? albuterol /ipratropium  (DUONEB ) neb solution   1 Dose Nebulization Q4HWA RT   ? midazolam (VERSED) injection 2-4 mg  2-4 mg IntraVENous Q1H PRN   ? sodium chloride  (NS) flush 10 mL  10 mL InterCATHeter Q8H   ? sodium chloride  (NS) flush 10 mL  10 mL InterCATHeter PRN   ? heparin (porcine) pf 300 Units  300 Units InterCATHeter PRN   ? sodium chloride  (NS) flush 20 mL  20 mL InterCATHeter PRN   ? acetaminophen  (TYLENOL ) suppository 650 mg  650 mg Rectal Q6H PRN   ? albuterol  (PROVENTIL  VENTOLIN ) nebulizer solution 2.5 mg  2.5 mg Nebulization Q2H PRN   ? thiamine (B-1) 100 mg in 0.9% sodium chloride  50 mL IVPB  100 mg IntraVENous DAILY   ? DISCONTD: nitroglycerin (NITROBID) 2 % ointment 2 Inch  2 Inch Topical Q6H PRN   ? saline peripheral flush 5 mL  5 mL InterCATHeter PRN         Objective:     Physical Exam:  Awake, alert  Chest--clear  CV--rrr Nl S1S2 II/VI sem  ABD--+BS, mildy tender  Ext--unchanged    Data  Review:   Labs:  Recent Results (from the past 24 hour(s))   GLUCOSE, POC    Collection Time    05/12/09 11:39 AM   Component Value Range   ? POC GLUCOSE 140 (*) 65 - 105 (mg/dL)   GLUCOSE, POC    Collection Time    05/12/09  8:44 PM   Component Value Range   ? POC GLUCOSE 154 (*) 65 - 105 (mg/dL)   GLUCOSE, POC    Collection Time    05/13/09 12:22 AM   Component Value Range   ? POC GLUCOSE 164 (*) 65 - 105 (mg/dL)   VANCOMYCIN, TROUGH    Collection Time    05/13/09  1:30 AM   Component Value Range   ? Vancomycin,trough 12.8 (*) 5.0 - 10.0 (ug/ml)   ? Reported dose date: NOT PROVIDED     ? Reported dose time: NOT PROVIDED     ? Reported dose: NOT PROVIDED  (UNITS)   CBC WITH MANUAL DIFF    Collection Time    05/13/09  1:30 AM   Component Value Range   ? WBC 9.6  3.6 - 11.0 (K/uL)   ? RBC 2.94 (*) 3.80 - 5.20 (M/uL)   ? HGB 8.1 (*) 11.5 - 16.0 (g/dL)   ? HCT 26.9 (*) 35.0 - 47.0 (%)   ? MCV 91.5  80.0 - 99.0 (FL)   ? MCH 27.6  26.0 - 34.0 (PG)   ? MCHC 30.1  30.0 - 36.5 (g/dL)   ? RDW 21.6 (*) 11.5 - 14.5 (%)   ? PLATELET 84 (*) 150 - 400  (K/uL)   ? NEUTROPHILS 72  32 - 75 (%)   ? BANDS 3  0 - 6 (%)   ? LYMPHOCYTES 24  12 - 49 (%)   ? MONOCYTES 0 (*) 5 - 13 (%)   ? EOSINOPHILS 1  0 - 7 (%)   ? BASOPHILS 0  0 - 1 (%)   ? METAMYELOCYTES 0  (%)   ? MYELOCYTES 0  0 (%)   ? PROMYELOCYTES 0  0 (%)   ? BLASTS 0  0 (%)   ? OTHER CELL 0  0 ( )   ? ABSOLUTE NEUTS 7.2  1.8 - 8.0 (K/UL)   ? ABSOLUTE LYMPHS 2.3  0.8 - 3.5 (K/UL)   ? ABSOLUTE MONOS 0.0  0.0 - 1.0 (K/UL)   ? ABSOLUTE EOSINS 0.1  0.0 - 0.4 (K/UL)   ? ABSOLUTE BASOS 0.0  0.0 - 0.1 (K/UL)   ? RBC COMMENTS 2+ ANISOCYTOSIS     ? DF MANUAL     ? NRBC 0.0  0 (PER 100 WBC)   ? ABSOLUTE NRBC 0.00  0.00 - 0.01 (K/uL)   METABOLIC PANEL, COMPREHENSIVE    Collection Time    05/13/09  1:30 AM   Component Value Range   ? Sodium 150 (*) 136 - 145 (MMOL/L)   ? Potassium 3.5  3.5 - 5.1 (MMOL/L)   ? Chloride 119 (*) 97 - 108 (MMOL/L)   ? CO2 21  21 - 32 (MMOL/L)   ? Anion gap 10  5 - 15 (mmol/L)   ? Glucose 154 (*) 65 - 100 (MG/DL)   ? BUN 13  6 - 20 (MG/DL)   ? Creatinine 0.7  0.6 - 1.3 (MG/DL)   ? BUN/Creatinine ratio 19  12 - 20 ( )   ? GFR est AA >60  >60 (ml/min/1.69m2)   ?  GFR est non-AA >60  >60 (ml/min/1.49m2)   ? Calcium 9.1  8.5 - 10.1 (MG/DL)   ? Bilirubin, total 2.3 (*) 0.2 - 1.0 (MG/DL)   ? ALT 47  12 - 78 (U/L)   ? AST 26  15 - 37 (U/L)   ? Alk. phosphatase 95  50 - 136 (U/L)   ? Protein, total 5.7 (*) 6.4 - 8.2 (g/dL)   ? Albumin 1.9 (*) 3.5 - 5.0 (g/dL)   ? Globulin 3.8  2.0 - 4.0 (g/dL)   ? A-G Ratio 0.5 (*) 1.1 - 2.2 ( )   GLUCOSE, POC    Collection Time    05/13/09  6:14 AM   Component Value Range   ? POC GLUCOSE 144 (*) 65 - 105 (mg/dL)     Telemetry: normal sinus rhythm    Assessment:     Patient Active Hospital Problem List:  Nausea with vomiting (05/01/2009)    Hypokalemia (05/01/2009)    Hepatic encephalopathy (05/01/2009)    Gait abnormality (05/01/2009)    Alcoholic cirrhosis of liver (05/01/2009)    Hypomagnesemia (05/01/2009)    Hypoxic respiratory failure/pneumonia    NSTEMI    Anemia    L arm  fracture  Plan:     Continue ASA 81  Change IV metoprolol  to po  Decrease lovenox  to 40mg  SQ q24h  Check fasting lipids  Conservative rx (no cath) at this point.    Norleen MICAEL Gelineau, MD      Electronically signed by Gelineau Norleen ORN, MD at 05/13/2009  9:43 AM EDT

## 2009-05-13 NOTE — Progress Notes (Signed)
 Formatting of this note is different from the original.  Problem: Mobility Impaired (Adult)  Goal: *Acute Goals and Plan of Care (Insert Text)  Physical Therapy Goals  Initiated 05/12/2009  1. Patient will move from supine to sit and sit to supine , scoot up and down and roll side to side in bed with moderate assistance within 7 day(s).   2. Patient will transfer from bed to chair and chair to bed with moderate assistance using the least restrictive device within 7 day(s).  3. Patient will perform sit to stand with moderate assistance within 7 day(s).  4. Patient will be able to sit at EOB x 5 minutes while maintaining midline with minimum assist within 7 days.   5. Patient will perform 1 sets of 15 repetitions of active strengthening exercises for bilateral upper and lower extremity(s) with supervision/set-up within 7 day(s).     PHYSICAL THERAPY TREATMENT    NAME: Breanna Lester AGE: 48 y.o.  GENDER: female  DATE: 05/13/2009  PRIMARY DIAGNOSIS: AMS, hypokalemia, lft upper extremity pain  AMS, hypokalemia, lft upper extremity pain  AMS, hypokalemia, lft upper extremity pain  <principal problem not specified>  Precautions: Fall LUE NWB  Chart , physical therapy assessment, plan of care and goals  w ere  reviewed.    SUBJECTIVE:   Patient stated ? You guys are good motivators .?    OBJECTIVE DATA SUMMARY:   Critical Behavior:  Level of Consciousness: Alert  Orientation Level: Appropriate for age;Oriented to person  Cognition: Follows commands  Safety/Judgement: Not assessed  Functional Mobility Training:  Bed Mobility:  Rolling: Max assist to right, patient performs 25-49%  Supine to Sit: Assist X2;Min assist to right, patient performs 75-90%;Max assist to right, patient performs 25-49%  Sit to Supine: Additional time;Total assistance, patient performs <25%;Safety concerns;Verbal cues  Scooting: Total assistance, patient performs <25%  Balance:  Sitting: Impaired;With support  Sitting - Static: Poor (constant support)  (anterior pelvic tilt with decreased trunk extension due to )  Sitting - Dynamic: Poor (constant support)  Standing: With support;Without support (Poor)  EOB sitting balance activity:  Pt able to hold trunk upright with leaning to the right and posteriorly able to correct with verbal cues to lean forward; Verbal cues to bring trunk to midline, pt able to maintain 10 seconds.    Therapeutic Exercises:  Long Arc Quads: 1 x 7 in sitting (bilaterally)  Attempted hip flexion in sitting but pt unable to initiate movement    Pain: Pt stated no pain    Activity Tolerance:  Vitals Assessment 1:  Patient Position : At rest;Supine  BP: 130/57 mmHg  Pulse (Heart Rate): 74   O2 Sat (%): 95 %  O2 Device: Room air          After treatment:  [X]     Patient left in no apparent distress  [X]     Call bell left within reach  [ ]     Caregiver present  [ ]     Patient left up in chair, nursing staff notified    ASSESSMENT AND PROGRESSION TOWARD GOALS:   Pt able to assist with bed mobility and maintain balance at EOB with mod to CGA and verbal cues.  Pt still limited by weakness and was not able to perform hip flexion today.     Patient?s progression toward goals is as follows:  [ ]     Improving appropriately and progressing toward goals  [X]     Improving slowly and progressing toward goals  [ ]   Not making progress toward goals and plan of care will be adjusted    PLAN OF CARE:   Patient continues to benefit from skilled intervention to address the above impairments.  Continue treatment per established plan of care.  Planned Interventions:  [X]     Bed Mobility Training              [ ]     Neuromuscular Re-education  [X]     Transfer Training                      [ ]     Orthotic/Prosthetic Training  [ ]     Gait Training                               [ ]     Modalities  [X]    Therapeutic Exercises             [ ]     Edema Management/Control  [X]     Therapeutic Activities              [X]     Patient and Family Training/Education  [ ]      Other (comment):  Discharge Recommendations:  [ ]     To be determined                      [ ]     Day rehabilitation program  [ ]     Home without services              [ ]     Outpatient therapy  [ ]     Home with Home Health services        [X]     Nursing facility for rehab  [X]     Inpatient Rehab Hospital         [ ]     Nursing facility for long term care  [ ]     Other:  Further Equipment Recommendations for Discharge: TBD  Communication/Collaboration:  The patient?s plan of care was discussed with:  Registered Nurse and Rehabilitation [facility]. [facility]  Time Calculation: 20 mins      Electronically signed by Jonette Shadow A at 05/13/2009  1:32 PM EDT

## 2009-05-13 NOTE — Progress Notes (Signed)
 Formatting of this note might be different from the original.  Patient assessed for skin breakdown.  Patient has incontinent dermatitis on her left buttocks .  A partial thickness wound, wound base is pink, skin is macerated from leaking rectal tube.  Recommend white extra protective cream be applied TID and PRN.  Donzell GORMAN Peasant, RN    Electronically signed by Peasant Donzell GORMAN, RN at 05/13/2009  7:09 PM EDT

## 2009-05-13 NOTE — Progress Notes (Signed)
 Formatting of this note might be different from the original.  Pt turned in bed, wound care nurse in to see patient break down on buttocks, will write orders, pt has no pain and tolerated turn.  Electronically signed by Gerhardt Domino, RN at 05/13/2009  6:16 PM EDT

## 2009-05-13 NOTE — Progress Notes (Signed)
 Formatting of this note might be different from the original.  Pharmacy Vancomycin dosing    Renal fxn is stable, Scr = 0.7.  Est Crcl  = 78.  Trough came back at 12.8 (adjusted to true trough of 11.8).  Goal is 15-20.      Adjusted dose to 1gm IV q12h from 750mg  IV q12h for expected trough of 15-16.  Will cont to follow and watch renal fxn    Thank you,  Daved Salm, PharmD  Electronically signed by Danelle Daved RAMAN, RPH at 05/13/2009 10:37 AM EDT

## 2009-05-13 NOTE — Progress Notes (Signed)
 Formatting of this note is different from the original.  Problem: Self Care Deficits Care Plan (Adult)  Goal: *Acute Goals and Plan of Care (Insert Text)  Occupational Therapy Goals Initiated 05/11/09    1. Pt will complete grooming in supported position with minimal assistance within 7 days.  2. Pt will increase fine motor skills to open task containers (toothpaste, lotion, ect) with set up assist within 7 days.  3. When appropriate, transfer training in preparation for functional mobility with be initiated and appropriate goal will be set.  4. Pt will tolerated x 10 min edge of bed with minimal assistance while completing dynamic balance activity within 7 days.  5. Pt will move from supine to sit with minimal assistance within 7 days, in preparation for functional mobility.   OCCUPATIONAL THERAPY TREATMENT    NAME: Breanna Lester AGE: 48 y.o.  GENDER: female  DATE: 05/13/2009  PRIMARY DIAGNOSIS: AMS, hypokalemia, lft upper extremity pain  AMS, hypokalemia, lft upper extremity pain  AMS, hypokalemia, lft upper extremity pain  <principal problem not specified>  Precautions: Falls, NWB on Left wrist    OBJECTIVE DATA SUMMARY:   Cognitive/Behavioral Status:  Following 1 step commands with mod to max cues consistently.  Attempting to communicate, but is difficult to understand.   Distractable requiring redirection.  Therapeutic Exercises:  Performed to increase UE strength and endurance for ADL performance  BUE   EXERCISE   Sets   Reps   Active Active Assist   Passive   Comments   Bilateral Shoulder scaption 1 10 [ ]  [X]  [ ]      Bilateral Elbow flexion/  extension 1 10 [ ]  [X]  [ ]      Right wrist flexion/ extenstion 1 10 [ ]  [X]  [ ]      Right digit flexion/  extension 1 10 [X]  [X]  [ ]      Left digit flexion/ extension 1 10 [ ]  [ ]  [X]      Right fore arm supination/ pronation 1 10 [ ]  [X]  [ ]    Done to prevent decreased ROM     After treatment:  [X]    Patient left in NAD  [X]    Call bell left within reach  [ ]    Caregiver  present  [ ]    Patient left supine  ASSESSMENT AND PROGRESSION TOWARD GOALS:   Patient still confused, but cooperative and following 1 step commands. Still extremely weak BUEs contributing to her limited functional independence. If we can increase the patient tolerance for therapy she will be a good inpatient rehab candidate.    Patient?s progression toward goals is as follows:  [ ]    Improving appropriately and progressing toward goals  [X]    Improving slowly and progressing toward goals  [ ]    Not making progress toward goals and plan of care will be adjusted    PLAN OF CARE:   Patient continues to benefit from skilled intervention to address the above impairments.  Continue treatment per established plan of care.  Planned Interventions:  [X]    Self Care Training                    [X]     Therapeutic Activities  [X]    Functional Mobility Training     [X]     Cognitive Retraining  [X]    Therapeutic Exercises              [X]     Endurance Activities  [X]    Balance Training                       [  X]    Neuromuscular Re-education  [X]    Patient Education                      [X]     Family Training/Education  [ ]    Visual/Perceptual Training         [X]     Home safety training  [ ]    Other (Comment):  Discharge Recommendations:  [ ]    To be determined                                   [ ]     Day rehabilitation program  [ ]    Home without services  (home safety education provided)  [ ]    Home with Home Health services  (home safety education provided)  [ ]   Outpatient therapy                                  [X]     Nursing facility for rehab  VS  [X]    Inpatient Rehab Hospital  Pending patient progress.   [ ]     Nursing facility for long term care  [ ]    Other:  Further Equipment Recommendations for Discharge: TBD  Communication/Collaboration:  The patient?s plan of care was discussed with:  Physical Therapist    Elouise SHAUNNA Putnam, OTR/L  Time Calculation: 15 mins          Electronically signed by Putnam Elouise SHAUNNA, OTR/L at  05/13/2009  4:31 PM EDT

## 2009-05-13 NOTE — Progress Notes (Signed)
 Formatting of this note is different from the original.       Hospitalist Progress Note         NAME: Breanna Lester        DOB:  04-24-1961        MRN:  [id number]      Assessment & Plan   Sepsis syndrome :underlying pneumonia, fever, leukocytosis  Broad spectrum antibiotics.  No evidence of UTI or ascites(SBP), has an umbilical hernia that is not     reducible but overall adbomen is soft, has RUQ pain, CT gallbladder sludge and thickenned wall, no fever now    Acute respiratory failure:got extubated doing well off the ventilator    Altered mental status : multifactorial Sepsis, hypernatremia, ARF, hyperamonemia   has improved AMS, trying to communicate now     Hypernatremia: stable, will increase hypotonic IVF.    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    Nutrition: Currently on TPN, will discuss with nutrition and speech to consider PO feedings.     Subjective:     48yo caucasian female    Chief Complaint:  Some belly pain , awake , follows commands    Discussed with RN events overnight.     Review of Systems:  Fever/chills y   Cough N   Sputum    N   SOB/DOE  N   Chest Pain N   Abdominal Pain y   Diarrhea N   Constipation N   Nausea/Vomiting N   Dysuria N   Tolerating PT N   Tolerating Diet N   Could not obtain due to AMS vs intubation         Objective:   Patient extubated , follow commands, decision for PEG was revoked patient on TPN, I will await before restarting enteral feed patient c/o RUQ pain    VITALS:   Last 24hrs VS reviewed since prior progress note. Most recent are:  Visit Vitals   Item Reading   ? BP 131/66   ? Pulse 76   ? Temp 98.3 F (36.8 C)   ? Resp 18   ? Ht 5' 2 (1.575 m)   ? Wt 144 lb 13.5 oz (65.7 kg)   ? SpO2 93%   ? PF 40L/min     Intake/Output Summary (Last 24 hours) at 05/13/09 0953  Last data filed at 05/13/09 0300   Gross per 24 hour   Intake   1071 ml   Output   2240 ml   Net  -1169 ml       Telemetry Reviewed:     PHYSICAL EXAM:  General: Extubated, awake ,   HEENT: Pupils unequal, R 3mm, L 2mm  Lungs:  Bilateral rales, ronchi   Heart:  Regular rhythm, tachycardic, SEM  Abdomen: Soft, Non distended, hypoactive BS,RUQ pain,  no  rebound, umbilical hernia non reducible,  Extremities: No edema, pulses present, brace in left arm  Neurologic: GCS [id number], no gross deficit  Psych: AMS    Lab Data Reviewed: (see below)    Medications Reviewed: (see below)    PMH/SH reviewed - no change compared to H&P  ______________________________________________________________________      Care Plan discussed with:  Patient x   Family    RN x   Care Manager    Consultant/Specialist      >50% of visit spent in counseling and coordination of care       Prophylaxis:  GI PPI   DVT lovenox      Disposition:   Home with Family    HH/PT/OT/RN    SNF/LTC x   [facility]      ______________________________________________________________________  Attending Physician: Kerney Meter, MD   _____________________________________________________________________________________________________  Procedures: see electronic medical records for all procedures/Xrays and details  which were not copied into this note but were reviewed prior to creation of Plan.      LABS:  Recent Labs   Basename 05/13/09 0130 05/12/09 0400   ? WBC 9.6 9.7   ? HGB 8.1* 8.0*   ? HCT  26.9* 26.7*   ? PLT 84* 56*     Recent Labs   Basename 05/13/09 0130 05/12/09 0400 05/11/09 0410   ? NA 150* 154* 154*   ? K 3.5 3.7 3.5   ? CL 119* 122* 123*   ? CO2 21 22 21    ? BUN 13 14 14    ? CREA 0.7 0.7 0.8   ? GLU 154* 153* 131*   ? CA 9.1 8.9 8.7   ? MG -- -- 2.0   ? PHOS -- -- --   ? URICA -- -- --     Recent Labs   Basename 05/13/09 0130 05/12/09 0400 05/11/09 0410   ? SGOT 26 27 39*   ? GPT 47 52 66   ? AP 95 107 113   ? TBIL 2.3* 2.7* 3.3*   ? TP 5.7* 5.7* 5.7*   ? ALB 1.9* 2.0* 2.0*   ? GLOB 3.8 3.7 3.7   ? GGT -- -- --   ? AML -- -- --   ? LPSE -- -- --     No results found for this basename: INR:3,PTP:3,APTT:3, in the last 72 hours     No results found for this basename: FE:2,TIBC:2,PSAT:2,FERR:2, in the last 72 hours     No results found for this basename: PH:2,PCO2:2,PO2:2, in the last 72 hours    No results found for this basename: CPK:3,CKMB:3,TROPONINI:3, in the last 72 hours    Lab Results   Component Value Date/Time    POC GLUCOSE 144 05/13/2009  6:14 AM    POC GLUCOSE 164 05/13/2009 12:22 AM    POC GLUCOSE 154 05/12/2009  8:44 PM    POC GLUCOSE 140 05/12/2009 11:39 AM    POC GLUCOSE 148 05/12/2009  5:40 AM     Lab Results   Component Value Date/Time    Color DARK YELLOW 05/09/2009 11:05 AM    Appearance CLEAR 05/09/2009 11:05 AM    Specific gravity 1.010 10/29/2008 11:00 AM    Specific gravity 1.024 05/09/2009 11:05 AM    pH 6.0 05/09/2009 11:05 AM    Protein TRACE 05/09/2009 11:05 AM    Glucose NEGATIVE  05/09/2009 11:05 AM    Ketone NEGATIVE  05/09/2009 11:05 AM    Bilirubin NEGATIVE  04/30/2009  12:14 AM    Urobilinogen 0.2 05/09/2009 11:05 AM    Nitrites NEGATIVE  05/09/2009 11:05 AM    Leukocyte Esterase SMALL 05/09/2009 11:05 AM    Epithelial cells 0-5 05/09/2009 11:05 AM    Bacteria NEGATIVE  05/09/2009 11:05 AM    WBC 5-10 05/09/2009 11:05 AM    RBC 10-20 05/09/2009 11:05 AM     MEDICATIONS:  Current facility-administered medications   Medication Dose Route Frequency   ? enoxaparin  (LOVENOX ) injection 40 mg  40  mg SubCUTAneous Q24H   ? metoprolol  (LOPRESSOR ) tablet 25 mg  25 mg Oral Q12H   ? VANCOMYCIN TROUGH LEVEL  1 Each Does Not Apply ONCE   ? sodium chloride  (NS) 0.9 % flush        ? dextrose 5% infusion    IntraVENous CONTINUOUS   ? TPN ADULT - PERIPHERAL     IntraVENous CONTINUOUS   ? DISCONTD: PHARMACY TEST ORDER  1 Each Does Not Apply ONCE   ? TPN ADULT - PERIPHERAL     IntraVENous CONTINUOUS   ? lactulose (CHRONULAC) solution 20 g  20 g Oral BID   ? aspirin chewable tablet 81 mg  81 mg Oral DAILY   ? levothyroxine (SYNTHROID) injection 25 mcg  25 mcg IntraVENous DAILY   ? levofloxacin (LEVAQUIN) 750 mg infusion   750 mg IntraVENous Q24H   ? vancomycin (VANCOCIN) 0.75 g in 0.9% sodium chloride  250 mL IVPB  750 mg IntraVENous Q12H   ? DISCONTD: dextrose 5% infusion    IntraVENous CONTINUOUS   ? piperacillin-tazobactam (ZOSYN) 3.375 g in 0.9% sodium chloride  (MBP/ADV) 100 mL MBP  3.375 g IntraVENous Q6H   ? DISCONTD: enoxaparin  (LOVENOX ) injection 70 mg  70 mg SubCUTAneous Q12H   ? albumin human 5% (BUMINATE) solution 25 g  25 g IntraVENous Q4H PRN   ? DISCONTD: metoprolol  tartrate (LOPRESSOR ) injection 2.5 mg  2.5 mg IntraVENous Q6H   ? morphine  injection 1 mg  1 mg IntraVENous PRN   ? pantoprazole  (PROTONIX ) injection 40 mg  40 mg IntraVENous DAILY   ? albuterol /ipratropium (DUONEB ) neb solution   1 Dose Nebulization Q4HWA RT   ? midazolam (VERSED) injection 2-4 mg  2-4 mg IntraVENous Q1H PRN   ? sodium chloride  (NS) flush 10 mL  10 mL InterCATHeter Q8H   ? sodium chloride  (NS) flush 10 mL  10 mL InterCATHeter PRN   ? heparin (porcine) pf 300 Units  300 Units InterCATHeter PRN   ? sodium chloride  (NS) flush 20 mL  20 mL InterCATHeter PRN   ? acetaminophen  (TYLENOL ) suppository 650 mg  650 mg Rectal Q6H PRN   ? albuterol  (PROVENTIL  VENTOLIN ) nebulizer solution 2.5 mg  2.5 mg Nebulization Q2H PRN   ? thiamine (B-1) 100 mg in 0.9% sodium chloride  50 mL IVPB  100 mg IntraVENous DAILY   ? DISCONTD: nitroglycerin  (NITROBID) 2 % ointment 2 Inch  2 Inch Topical Q6H PRN   ? saline peripheral flush 5 mL  5 mL InterCATHeter PRN       Electronically signed by Dominique Cons, MD at 05/13/2009  9:57 AM EDT

## 2009-05-13 NOTE — Progress Notes (Signed)
 Formatting of this note might be different from the original.  Pt has an abrasion to rt lower  Quad. of abdomen, possible bruises from lonenox injection,also lt. ebow with a 6x2cm bruise w/c eschar noted.  Electronically signed by Howie Boys at 05/13/2009  1:56 AM EDT

## 2009-05-13 NOTE — Progress Notes (Signed)
 Formatting of this note might be different from the original.  Bedside and Verbal shift change report received from off going nurse.    Electronically signed by Waddell Slough D at 05/13/2009  7:43 AM EDT

## 2009-05-13 NOTE — Progress Notes (Signed)
 Formatting of this note is different from the original.  Nutrition Recommendations:   Provide free H2O flushes via NGT to aid with hypernatremia and keep tube patent   Diet safety/appropriate consistency per SLP     If abdominal pain subsides/decreases, consider starting TF via NGT    Standard, High Fiber (Jevity 1.5) at 26mL/hr advance if tolerated after 12 hours to goal 88mL/hr continuously, flush with 100 mL H2O Q4hrs.    Goal provides: 1440 kcal, 61g Pro, 1330 mL free H2O with TF and flushes.     If unable to start TF or PO diet:   Consider changing from PPN to TPN to better meet pt's needs:      If D5 IVF to continue at 100mL/hr: 5% Amino Acids, 15% Dextrose at 63mL/hr   Provides: 1074 kcal, 75.6g Pro    (will provide: 1482 kcal, 75.6g Pro with TPN and D5 IVF at 100mL/hr)     If D5 IVF to be d/c'd: 5%Amino Acids, 20% Dextrose at 63mL/hr   Provides: 1331 kcal, 75.6g Pro      Progress Note:  Pt is day 4 PPN.  PPN meeting ~protein needs, but no calorie needs; providing 771 kcal, 64 g Pro per day.   D5 IVF started yesterday also contributing to intake, but still not meeting estimated calorie needs of 1350 kcal/day.  D5 + PPN = 1179kcal and 64g Pro/day.      SLP has rec'd continued NPO with ?plans for MBS soon.  Hypernatremia continues this AM (150).  Pt with rectal tube, continues on lactulose.  NGT with meds/flushes only.  Pt continue to c/o abdominal pain per reports (pt hard to understand and currently nsg in providing care).    ______________________________________________________  POC:  Lab Results   Component Value Date/Time    POC GLUCOSE 144 05/13/2009  6:14 AM    POC GLUCOSE 164 05/13/2009 12:22 AM    POC GLUCOSE 154 05/12/2009  8:44 PM    POC GLUCOSE 140 05/12/2009 11:39 AM     Pt's BMI:  Estimated Body mass index is 26.49 kg/(m^2) as calculated from the following:    Height as of this encounter: 5' 2(1.575 m).    Weight as of this encounter: 144 lb 13.5 oz(65.7 kg).  Based on the BMI above,  pt is overweight.  ______________________________________________________  Thank you  Morna Solomons, RD  Pager [phone]    Electronically signed by Solomons Morna BROCKS, RD at 05/13/2009 11:15 AM EDT

## 2009-05-13 NOTE — Progress Notes (Signed)
Bedside and Verbal shift change report received from off going nurse.

## 2009-05-13 NOTE — Progress Notes (Signed)
Hospitalist Progress Note         NAME: Breanna Lester        DOB:  1961/05/07        MRN:  [id number]      Assessment & Plan   Sepsis syndrome :underlying pneumonia, fever, leukocytosis  ????????????????????????Broad spectrum antibiotics.  ????????????????????????No evidence of UTI or ascites(SBP), has an umbilical hernia that is not     reducible but overall adbomen is soft, has RUQ pain, CT gallbladder sludge and thickenned wall, no fever now    Acute respiratory failure:got extubated doing well off the ventilator    Altered mental status : multifactorial Sepsis, hypernatremia, ARF, hyperamonemia   has improved AMS, trying to communicate now     Hypernatremia: stable, will increase hypotonic IVF.    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    Nutrition: Currently on TPN, will discuss with nutrition and speech to consider PO feedings.       Subjective:     48yo caucasian female    Chief Complaint:  Some belly pain , awake , follows commands    Discussed with RN events overnight.     Review of Systems:  Fever/chills y   Cough N   Sputum    N   SOB/DOE  N   Chest Pain N   Abdominal Pain y   Diarrhea N   Constipation N   Nausea/Vomiting N   Dysuria N   Tolerating PT N   Tolerating Diet N   Could not obtain due to AMS vs intubation         Objective:    Patient extubated , follow commands, decision for PEG was revoked patient on TPN, I will await before restarting enteral feed patient c/o RUQ pain    VITALS:   Last 24hrs VS reviewed since prior progress note. Most recent are:  Visit Vitals   Item Reading   ??? BP 131/66   ??? Pulse 76   ??? Temp 98.3 ??F (36.8 ??C)   ??? Resp 18   ??? Ht 5\' 2"  (1.575 m)   ??? Wt 144 lb 13.5 oz (65.7 kg)   ??? SpO2 93%   ??? PF 40L/min           Intake/Output Summary (Last 24 hours) at 05/13/09 0953  Last data filed at 05/13/09 0300   Gross per 24 hour   Intake   1071 ml   Output   2240 ml   Net  -1169 ml        Telemetry Reviewed:     PHYSICAL EXAM:  General: Extubated, awake ,   HEENT: Pupils unequal, R 3mm, L 2mm  Lungs:  Bilateral rales, ronchi   Heart:  Regular rhythm, tachycardic, SEM  Abdomen: Soft, Non distended, hypoactive BS,RUQ pain,  no rebound, umbilical hernia non reducible,  Extremities: No edema, pulses present, brace in left arm  Neurologic:?? GCS [id number], no gross deficit  Psych:???? AMS    Lab Data Reviewed: (see below)    Medications Reviewed: (see below)    PMH/SH reviewed - no change compared to H&P  ______________________________________________________________________        Care Plan discussed with:  Patient x   Family    RN x   Care Manager    Consultant/Specialist      >50% of visit spent in counseling and coordination of care       Prophylaxis:  GI PPI   DVT lovenox     Disposition:   Home with Family    HH/PT/OT/RN    SNF/LTC x   [facility]      ______________________________________________________________________  Attending Physician: Ayesha Rumpf, MD   _____________________________________________________________________________________________________  Procedures: see electronic medical records for all procedures/Xrays and details  which were not copied into this note but were reviewed prior to creation of Plan.      LABS:  Recent Labs   Basename 05/13/09 0130 05/12/09 0400   ??? WBC 9.6 9.7   ??? HGB 8.1* 8.0*   ??? HCT 26.9* 26.7*    ??? PLT 84* 56*         Recent Labs   Basename 05/13/09 0130 05/12/09 0400 05/11/09 0410   ??? NA 150* 154* 154*   ??? K 3.5 3.7 3.5   ??? CL 119* 122* 123*   ??? CO2 21 22 21    ??? BUN 13 14 14    ??? CREA 0.7 0.7 0.8   ??? GLU 154* 153* 131*   ??? CA 9.1 8.9 8.7   ??? MG -- -- 2.0   ??? PHOS -- -- --   ??? URICA -- -- --         Recent Labs   Basename 05/13/09 0130 05/12/09 0400 05/11/09 0410   ??? SGOT 26 27 39*   ??? GPT 47 52 66   ??? AP 95 107 113   ??? TBIL 2.3* 2.7* 3.3*   ??? TP 5.7* 5.7* 5.7*   ??? ALB 1.9* 2.0* 2.0*   ??? GLOB 3.8 3.7 3.7   ??? GGT -- -- --   ??? AML -- -- --   ??? LPSE -- -- --           No results found for this basename: INR:3,PTP:3,APTT:3, in the last 72 hours     No results found for this basename: FE:2,TIBC:2,PSAT:2,FERR:2, in the last 72 hours     No results found for this basename: PH:2,PCO2:2,PO2:2, in the last 72 hours      No results found for this basename: CPK:3,CKMB:3,TROPONINI:3, in the last 72 hours    Lab Results   Component Value Date/Time    POC GLUCOSE 144 05/13/2009  6:14 AM    POC GLUCOSE 164 05/13/2009 12:22 AM    POC GLUCOSE 154 05/12/2009  8:44 PM    POC GLUCOSE 140 05/12/2009 11:39 AM    POC GLUCOSE 148 05/12/2009  5:40 AM         Lab Results   Component Value Date/Time    Color DARK YELLOW 05/09/2009 11:05 AM    Appearance CLEAR 05/09/2009 11:05 AM    Specific gravity 1.010 10/29/2008 11:00 AM    Specific gravity 1.024 05/09/2009 11:05 AM    pH 6.0 05/09/2009 11:05 AM    Protein TRACE 05/09/2009 11:05 AM    Glucose NEGATIVE  05/09/2009 11:05 AM  Ketone NEGATIVE  05/09/2009 11:05 AM    Bilirubin NEGATIVE  04/30/2009 12:14 AM    Urobilinogen 0.2 05/09/2009 11:05 AM    Nitrites NEGATIVE  05/09/2009 11:05 AM    Leukocyte Esterase SMALL 05/09/2009 11:05 AM    Epithelial cells 0-5 05/09/2009 11:05 AM    Bacteria NEGATIVE  05/09/2009 11:05 AM    WBC 5-10 05/09/2009 11:05 AM    RBC 10-20 05/09/2009 11:05 AM           MEDICATIONS:  Current facility-administered medications   Medication Dose Route Frequency    ??? enoxaparin (LOVENOX) injection 40 mg  40 mg SubCUTAneous Q24H   ??? metoprolol (LOPRESSOR) tablet 25 mg  25 mg Oral Q12H   ??? VANCOMYCIN TROUGH LEVEL  1 Each Does Not Apply ONCE   ??? sodium chloride (NS) 0.9 % flush        ??? dextrose 5% infusion    IntraVENous CONTINUOUS   ??? TPN ADULT - PERIPHERAL     IntraVENous CONTINUOUS   ??? DISCONTD: PHARMACY TEST ORDER  1 Each Does Not Apply ONCE   ??? TPN ADULT - PERIPHERAL     IntraVENous CONTINUOUS   ??? lactulose (CHRONULAC) solution 20 g  20 g Oral BID   ??? aspirin chewable tablet 81 mg  81 mg Oral DAILY   ??? levothyroxine (SYNTHROID) injection 25 mcg  25 mcg IntraVENous DAILY   ??? levofloxacin (LEVAQUIN) 750 mg infusion   750 mg IntraVENous Q24H   ??? vancomycin (VANCOCIN) 0.75 g in 0.9% sodium chloride 250 mL IVPB  750 mg IntraVENous Q12H   ??? DISCONTD: dextrose 5% infusion    IntraVENous CONTINUOUS   ??? piperacillin-tazobactam (ZOSYN) 3.375 g in 0.9% sodium chloride (MBP/ADV) 100 mL MBP  3.375 g IntraVENous Q6H   ??? DISCONTD: enoxaparin (LOVENOX) injection 70 mg  70 mg SubCUTAneous Q12H   ??? albumin human 5% (BUMINATE) solution 25 g  25 g IntraVENous Q4H PRN   ??? DISCONTD: metoprolol tartrate (LOPRESSOR) injection 2.5 mg  2.5 mg IntraVENous Q6H   ??? morphine injection 1 mg  1 mg IntraVENous PRN   ??? pantoprazole (PROTONIX) injection 40 mg  40 mg IntraVENous DAILY   ??? albuterol/ipratropium (DUONEB) neb solution   1 Dose Nebulization Q4HWA RT   ??? midazolam (VERSED) injection 2-4 mg  2-4 mg IntraVENous Q1H PRN   ??? sodium chloride (NS) flush 10 mL  10 mL InterCATHeter Q8H   ??? sodium chloride (NS) flush 10 mL  10 mL InterCATHeter PRN   ??? heparin (porcine) pf 300 Units  300 Units InterCATHeter PRN   ??? sodium chloride (NS) flush 20 mL  20 mL InterCATHeter PRN   ??? acetaminophen (TYLENOL) suppository 650 mg  650 mg Rectal Q6H PRN   ??? albuterol (PROVENTIL VENTOLIN) nebulizer solution 2.5 mg  2.5 mg Nebulization Q2H PRN    ??? thiamine (B-1) 100 mg in 0.9% sodium chloride 50 mL IVPB  100 mg IntraVENous DAILY   ??? DISCONTD: nitroglycerin (NITROBID) 2 % ointment 2 Inch  2 Inch Topical Q6H PRN   ??? saline peripheral flush 5 mL  5 mL InterCATHeter PRN

## 2009-05-13 NOTE — Progress Notes (Signed)
Pt has an abrasion to rt lower  Quad. of abdomen, possible bruises from lonenox injection,also lt. ebow with a 6x2cm bruise w/c eschar noted.

## 2009-05-13 NOTE — Progress Notes (Signed)
Pharmacy Vancomycin dosing    Renal fxn is stable, Scr = 0.7.  Est Crcl  = 78.  Trough came back at 12.8 (adjusted to true trough of 11.8).  Goal is 15-20.      Adjusted dose to 1gm IV q12h from 750mg  IV q12h for expected trough of 15-16.  Will cont to follow and watch renal fxn    Thank you,  Ronda Fairly, PharmD

## 2009-05-13 NOTE — Progress Notes (Signed)
Problem: Self Care Deficits Care Plan (Adult)  Goal: *Acute Goals and Plan of Care (Insert Text)  Occupational Therapy Goals Initiated 05/11/09    1. Pt will complete grooming in supported position with minimal assistance within 7 days.  2. Pt will increase fine motor skills to open task containers (toothpaste, lotion, ect) with set up assist within 7 days.  3. When appropriate, transfer training in preparation for functional mobility with be initiated and appropriate goal will be set.  4. Pt will tolerated x 10 min edge of bed with minimal assistance while completing dynamic balance activity within 7 days.  5. Pt will move from supine to sit with minimal assistance within 7 days, in preparation for functional mobility.   OCCUPATIONAL THERAPY TREATMENT    NAME: Breanna Lester AGE: 48 y.o.  GENDER: female  DATE: 05/13/2009  PRIMARY DIAGNOSIS: AMS, hypokalemia, lft upper extremity pain  AMS, hypokalemia, lft upper extremity pain  AMS, hypokalemia, lft upper extremity pain  <principal problem not specified>  Precautions: Falls, NWB on Left wrist      OBJECTIVE DATA SUMMARY:   Cognitive/Behavioral Status:  Following 1 step commands with mod to max cues consistently.  Attempting to communicate, but is difficult to understand.   Distractable requiring redirection.  Therapeutic Exercises:  Performed to increase UE strength and endurance for ADL performance  BUE   EXERCISE   Sets   Reps   Active Active Assist   Passive   Comments   Bilateral Shoulder scaption 1 10 [ ]  [X]  [ ]      Bilateral Elbow flexion/  extension 1 10 [ ]  [X]  [ ]      Right wrist flexion/ extenstion 1 10 [ ]  [X]  [ ]      Right digit flexion/  extension 1 10 [X]  [X]  [ ]      Left digit flexion/ extension 1 10 [ ]  [ ]  [X]      Right fore arm supination/ pronation 1 10 [ ]  [X]  [ ]    Done to prevent decreased ROM       After treatment:  [X]    Patient left in NAD  [X]    Call bell left within reach  [ ]    Caregiver present  [ ]    Patient left supine   ASSESSMENT AND PROGRESSION TOWARD GOALS:   Patient still confused, but cooperative and following 1 step commands. Still extremely weak BUEs contributing to her limited functional independence. If we can increase the patient tolerance for therapy she will be a good inpatient rehab candidate.      Patient???s progression toward goals is as follows:  [ ]    Improving appropriately and progressing toward goals  [X]    Improving slowly and progressing toward goals  [ ]    Not making progress toward goals and plan of care will be adjusted      PLAN OF CARE:   Patient continues to benefit from skilled intervention to address the above impairments.  Continue treatment per established plan of care.  Planned Interventions:  [X]    Self Care Training                    [X]     Therapeutic Activities  [X]    Functional Mobility Training     [X]     Cognitive Retraining  [X]    Therapeutic Exercises              [X]     Endurance Activities  [X]    Balance Training                       [  X]    Neuromuscular Re-education  [X]    Patient Education                      [X]     Family Training/Education  [ ]    Visual/Perceptual Training         [X]     Home safety training  [ ]    Other (Comment):  Discharge Recommendations:  [ ]    To be determined                                   [ ]     Day rehabilitation program  [ ]    Home without services  (home safety education provided)  [ ]    Home with Home Health services  (home safety education provided)  [ ]   Outpatient therapy                                  [X]     Nursing facility for rehab  VS  [X]    Inpatient Rehab Hospital  Pending patient progress.   [ ]     Nursing facility for long term care  [ ]    Other:  Further Equipment Recommendations for Discharge: TBD  Communication/Collaboration:  The patient???s plan of care was discussed with:  Physical Therapist    Michel Harrow, OTR/L  Time Calculation: 15 mins

## 2009-05-13 NOTE — Progress Notes (Signed)
Cardiology Progress Note      05/13/2009 9:39 AM    Admit Date: 04/29/2009    Admit Diagnosis: AMS, hypokalemia, lft upper extremity pain  AMS, hypokalemia, lft upper extremity pain  AMS, hypokalemia, lft upper extremity pain      Subjective:     More awake, speaking, following commands.  Moved out of ICU to 3rd floor.  No CV complaints.    BP 131/66   Pulse 76   Temp 98.3 ??F (36.8 ??C)   Resp 18   Ht 5\' 2"  (1.575 m)   Wt 144 lb 13.5 oz (65.7 kg)   SpO2 93%   PF 40 L/min  Current facility-administered medications   Medication Dose Route Frequency   ??? enoxaparin (LOVENOX) injection 40 mg  40 mg SubCUTAneous Q24H   ??? metoprolol (LOPRESSOR) tablet 25 mg  25 mg Oral Q12H   ??? VANCOMYCIN TROUGH LEVEL  1 Each Does Not Apply ONCE   ??? sodium chloride (NS) 0.9 % flush        ??? dextrose 5% infusion    IntraVENous CONTINUOUS   ??? TPN ADULT - PERIPHERAL     IntraVENous CONTINUOUS   ??? DISCONTD: PHARMACY TEST ORDER  1 Each Does Not Apply ONCE   ??? TPN ADULT - PERIPHERAL     IntraVENous CONTINUOUS   ??? lactulose (CHRONULAC) solution 20 g  20 g Oral BID   ??? aspirin chewable tablet 81 mg  81 mg Oral DAILY   ??? levothyroxine (SYNTHROID) injection 25 mcg  25 mcg IntraVENous DAILY   ??? levofloxacin (LEVAQUIN) 750 mg infusion   750 mg IntraVENous Q24H   ??? vancomycin (VANCOCIN) 0.75 g in 0.9% sodium chloride 250 mL IVPB  750 mg IntraVENous Q12H   ??? DISCONTD: dextrose 5% infusion    IntraVENous CONTINUOUS   ??? piperacillin-tazobactam (ZOSYN) 3.375 g in 0.9% sodium chloride (MBP/ADV) 100 mL MBP  3.375 g IntraVENous Q6H   ??? DISCONTD: enoxaparin (LOVENOX) injection 70 mg  70 mg SubCUTAneous Q12H   ??? albumin human 5% (BUMINATE) solution 25 g  25 g IntraVENous Q4H PRN   ??? DISCONTD: metoprolol tartrate (LOPRESSOR) injection 2.5 mg  2.5 mg IntraVENous Q6H   ??? morphine injection 1 mg  1 mg IntraVENous PRN   ??? pantoprazole (PROTONIX) injection 40 mg  40 mg IntraVENous DAILY   ??? albuterol/ipratropium (DUONEB) neb solution   1 Dose Nebulization Q4HWA RT    ??? midazolam (VERSED) injection 2-4 mg  2-4 mg IntraVENous Q1H PRN   ??? sodium chloride (NS) flush 10 mL  10 mL InterCATHeter Q8H   ??? sodium chloride (NS) flush 10 mL  10 mL InterCATHeter PRN   ??? heparin (porcine) pf 300 Units  300 Units InterCATHeter PRN   ??? sodium chloride (NS) flush 20 mL  20 mL InterCATHeter PRN   ??? acetaminophen (TYLENOL) suppository 650 mg  650 mg Rectal Q6H PRN   ??? albuterol (PROVENTIL VENTOLIN) nebulizer solution 2.5 mg  2.5 mg Nebulization Q2H PRN   ??? thiamine (B-1) 100 mg in 0.9% sodium chloride 50 mL IVPB  100 mg IntraVENous DAILY   ??? DISCONTD: nitroglycerin (NITROBID) 2 % ointment 2 Inch  2 Inch Topical Q6H PRN   ??? saline peripheral flush 5 mL  5 mL InterCATHeter PRN           Objective:      Physical Exam:  Awake, alert  Chest--clear  CV--rrr Nl S1S2 II/VI sem  ABD--+BS, mildy tender  Ext--unchanged  Data Review:   Labs:  Recent Results (from the past 24 hour(s))   GLUCOSE, POC    Collection Time    05/12/09 11:39 AM   Component Value Range   ??? POC GLUCOSE 140 (*) 65 - 105 (mg/dL)   GLUCOSE, POC    Collection Time    05/12/09  8:44 PM   Component Value Range   ??? POC GLUCOSE 154 (*) 65 - 105 (mg/dL)   GLUCOSE, POC    Collection Time    05/13/09 12:22 AM   Component Value Range   ??? POC GLUCOSE 164 (*) 65 - 105 (mg/dL)   VANCOMYCIN, TROUGH    Collection Time    05/13/09  1:30 AM   Component Value Range   ??? Vancomycin,trough 12.8 (*) 5.0 - 10.0 (ug/ml)   ??? Reported dose date: NOT PROVIDED     ??? Reported dose time: NOT PROVIDED     ??? Reported dose: NOT PROVIDED  (UNITS)   CBC WITH MANUAL DIFF    Collection Time    05/13/09  1:30 AM   Component Value Range   ??? WBC 9.6  3.6 - 11.0 (K/uL)   ??? RBC 2.94 (*) 3.80 - 5.20 (M/uL)   ??? HGB 8.1 (*) 11.5 - 16.0 (g/dL)   ??? HCT 26.9 (*) 35.0 - 47.0 (%)   ??? MCV 91.5  80.0 - 99.0 (FL)   ??? MCH 27.6  26.0 - 34.0 (PG)   ??? MCHC 30.1  30.0 - 36.5 (g/dL)   ??? RDW 21.6 (*) 11.5 - 14.5 (%)   ??? PLATELET 84 (*) 150 - 400 (K/uL)   ??? NEUTROPHILS 72  32 - 75 (%)    ??? BANDS 3  0 - 6 (%)   ??? LYMPHOCYTES 24  12 - 49 (%)   ??? MONOCYTES 0 (*) 5 - 13 (%)   ??? EOSINOPHILS 1  0 - 7 (%)   ??? BASOPHILS 0  0 - 1 (%)   ??? METAMYELOCYTES 0  (%)   ??? MYELOCYTES 0  0 (%)   ??? PROMYELOCYTES 0  0 (%)   ??? BLASTS 0  0 (%)   ??? OTHER CELL 0  0 ( )   ??? ABSOLUTE NEUTS 7.2  1.8 - 8.0 (K/UL)   ??? ABSOLUTE LYMPHS 2.3  0.8 - 3.5 (K/UL)   ??? ABSOLUTE MONOS 0.0  0.0 - 1.0 (K/UL)   ??? ABSOLUTE EOSINS 0.1  0.0 - 0.4 (K/UL)   ??? ABSOLUTE BASOS 0.0  0.0 - 0.1 (K/UL)   ??? RBC COMMENTS 2+ ANISOCYTOSIS     ??? DF MANUAL     ??? NRBC 0.0  0 (PER 100 WBC)   ??? ABSOLUTE NRBC 0.00  0.00 - 0.01 (K/uL)   METABOLIC PANEL, COMPREHENSIVE    Collection Time    05/13/09  1:30 AM   Component Value Range   ??? Sodium 150 (*) 136 - 145 (MMOL/L)   ??? Potassium 3.5  3.5 - 5.1 (MMOL/L)   ??? Chloride 119 (*) 97 - 108 (MMOL/L)   ??? CO2 21  21 - 32 (MMOL/L)   ??? Anion gap 10  5 - 15 (mmol/L)   ??? Glucose 154 (*) 65 - 100 (MG/DL)   ??? BUN 13  6 - 20 (MG/DL)   ??? Creatinine 0.7  0.6 - 1.3 (MG/DL)   ??? BUN/Creatinine ratio 19  12 - 20 ( )   ??? GFR est AA >60  >60 (ml/min/1.34m2)   ???  GFR est non-AA >60  >60 (ml/min/1.2m2)   ??? Calcium 9.1  8.5 - 10.1 (MG/DL)   ??? Bilirubin, total 2.3 (*) 0.2 - 1.0 (MG/DL)   ??? ALT 47  12 - 78 (U/L)   ??? AST 26  15 - 37 (U/L)   ??? Alk. phosphatase 95  50 - 136 (U/L)   ??? Protein, total 5.7 (*) 6.4 - 8.2 (g/dL)   ??? Albumin 1.9 (*) 3.5 - 5.0 (g/dL)   ??? Globulin 3.8  2.0 - 4.0 (g/dL)   ??? A-G Ratio 0.5 (*) 1.1 - 2.2 ( )   GLUCOSE, POC    Collection Time    05/13/09  6:14 AM   Component Value Range   ??? 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      Ellwood Sayers, MD

## 2009-05-13 NOTE — Progress Notes (Signed)
Nutrition Recommendations:  ?? Provide free H2O flushes via NGT to aid with hypernatremia and keep tube patent  ?? Diet safety/appropriate consistency per SLP    ?? If abdominal pain subsides/decreases, consider starting TF via NGT   ?? "Standard, High Fiber (Jevity 1.5)" at 49mL/hr advance if tolerated after 12 hours to goal 92mL/hr continuously, flush with 100 mL H2O Q4hrs.   ?? Goal provides: 1440 kcal, 61g Pro, 1330 mL free H2O with TF and flushes.    ?? If unable to start TF or PO diet:  ?? Consider changing from PPN to TPN to better meet pt's needs:     ?? If D5 IVF to continue at 179mL/hr: 5% Amino Acids, 15% Dextrose at 79mL/hr  ?? Provides: 1074 kcal, 75.6g Pro   ?? (will provide: 1482 kcal, 75.6g Pro with TPN and D5 IVF at 158mL/hr)    ?? If D5 IVF to be d/c'd: 5%Amino Acids, 20% Dextrose at 46mL/hr  ?? Provides: 1331 kcal, 75.6g Pro      Progress Note:  Pt is day 4 PPN.  PPN meeting ~protein needs, but no calorie needs; providing 771 kcal, 64 g Pro per day.   D5 IVF started yesterday also contributing to intake, but still not meeting estimated calorie needs of 1350 kcal/day.  D5 + PPN = 1179kcal and 64g Pro/day.      SLP has rec'd continued NPO with ?plans for MBS soon.  Hypernatremia continues this AM (150).  Pt with rectal tube, continues on lactulose.  NGT with meds/flushes only.  Pt continue to c/o abdominal pain per reports (pt hard to understand and currently nsg in providing care).    ______________________________________________________  POC:  Lab Results   Component Value Date/Time    POC GLUCOSE 144 05/13/2009  6:14 AM    POC GLUCOSE 164 05/13/2009 12:22 AM    POC GLUCOSE 154 05/12/2009  8:44 PM    POC GLUCOSE 140 05/12/2009 11:39 AM     Pt's BMI:  Estimated Body mass index is 26.49 kg/(m^2) as calculated from the following:    Height as of this encounter: 5\' 2" (1.575 m).    Weight as of this encounter: 144 lb 13.5 oz(65.7 kg).  Based on the BMI above, pt is overweight.   ______________________________________________________  Thank you  Sonny Dandy, RD  Pager [phone]

## 2009-05-13 NOTE — Progress Notes (Signed)
Patient assessed for skin breakdown.  Patient has incontinent dermatitis on her left buttocks .  A partial thickness wound, wound base is pink, skin is macerated from leaking rectal tube.  Recommend white extra protective cream be applied TID and PRN.  Christena Deem, RN

## 2009-05-13 NOTE — Progress Notes (Signed)
Pt turned in bed, wound care nurse in to see patient break down on buttocks, will write orders, pt has no pain and tolerated turn.

## 2009-05-13 NOTE — Progress Notes (Signed)
Problem: Mobility Impaired (Adult)  Goal: *Acute Goals and Plan of Care (Insert Text)  Physical Therapy Goals  Initiated 05/12/2009  1. Patient will move from supine to sit and sit to supine , scoot up and down and roll side to side in bed with moderate assistance within 7 day(s).   2. Patient will transfer from bed to chair and chair to bed with moderate assistance using the least restrictive device within 7 day(s).  3. Patient will perform sit to stand with moderate assistance within 7 day(s).  4. Patient will be able to sit at EOB x 5 minutes while maintaining midline with minimum assist within 7 days.   5. Patient will perform 1 sets of 15 repetitions of active strengthening exercises for bilateral upper and lower extremity(s) with supervision/set-up within 7 day(s).     PHYSICAL THERAPY TREATMENT    NAME: Breanna Lester AGE: 48 y.o.  GENDER: female  DATE: 05/13/2009  PRIMARY DIAGNOSIS: AMS, hypokalemia, lft upper extremity pain  AMS, hypokalemia, lft upper extremity pain  AMS, hypokalemia, lft upper extremity pain  <principal problem not specified>  Precautions: Fall LUE NWB  Chart , physical therapy assessment, plan of care and goals  w ere  reviewed.     SUBJECTIVE:   Patient stated ??? You guys are good motivators .???     OBJECTIVE DATA SUMMARY:   Critical Behavior:  Level of Consciousness: Alert  Orientation Level: Appropriate for age;Oriented to person  Cognition: Follows commands  Safety/Judgement: Not assessed  Functional Mobility Training:  Bed Mobility:  Rolling: Max assist to right, patient performs 25-49%  Supine to Sit: Assist X2;Min assist to right, patient performs 75-90%;Max assist to right, patient performs 25-49%  Sit to Supine: Additional time;Total assistance, patient performs <25%;Safety concerns;Verbal cues  Scooting: Total assistance, patient performs <25%  Balance:  Sitting: Impaired;With support   Sitting - Static: Poor (constant support) (anterior pelvic tilt with decreased trunk extension due to )  Sitting - Dynamic: Poor (constant support)  Standing: With support;Without support (Poor)  EOB sitting balance activity:  Pt able to hold trunk upright with leaning to the right and posteriorly able to correct with verbal cues to lean forward; Verbal cues to bring trunk to midline, pt able to maintain 10 seconds.    Therapeutic Exercises:  Long Arc Quads: 1 x 7 in sitting (bilaterally)  Attempted hip flexion in sitting but pt unable to initiate movement    Pain: Pt stated no pain    Activity Tolerance:  Vitals Assessment 1:  Patient Position : At rest;Supine  BP: 130/57 mmHg  Pulse (Heart Rate): 74   O2 Sat (%): 95 %  O2 Device: Room air                  After treatment:  [X]     Patient left in no apparent distress  [X]     Call bell left within reach  [ ]     Caregiver present  [ ]     Patient left up in chair, nursing staff notified      ASSESSMENT AND PROGRESSION TOWARD GOALS:   Pt able to assist with bed mobility and maintain balance at EOB with mod to CGA and verbal cues.  Pt still limited by weakness and was not able to perform hip flexion today.     Patient???s progression toward goals is as follows:  [ ]     Improving appropriately and progressing toward goals  [X]     Improving slowly and progressing  toward goals  [ ]     Not making progress toward goals and plan of care will be adjusted      PLAN OF CARE:   Patient continues to benefit from skilled intervention to address the above impairments.  Continue treatment per established plan of care.  Planned Interventions:  [X]     Bed Mobility Training              [ ]     Neuromuscular Re-education  [X]     Transfer Training                      [ ]     Orthotic/Prosthetic Training  [ ]     Gait Training                               [ ]     Modalities  [X]    Therapeutic Exercises             [ ]     Edema Management/Control   [X]     Therapeutic Activities              [X]     Patient and Family Training/Education  [ ]     Other (comment):  Discharge Recommendations:  [ ]     To be determined                      [ ]     Day rehabilitation program  [ ]     Home without services              [ ]     Outpatient therapy  [ ]     Home with Home Health services        [X]     Nursing facility for rehab  [X]     Inpatient Rehab Hospital         [ ]     Nursing facility for long term care  [ ]     Other:  Further Equipment Recommendations for Discharge: TBD  Communication/Collaboration:  The patient???s plan of care was discussed with:  Registered Nurse and Rehabilitation Attendant    Breanna Lester A. Vearl Aitken  Time Calculation: 20 mins

## 2009-05-14 LAB — CULTURE, BLOOD: Culture result:: NO GROWTH

## 2009-05-14 LAB — CBC WITH MANUAL DIFF
ABS. BASOPHILS: 0.1 10*3/uL (ref 0.0–0.1)
ABS. EOSINOPHILS: 0.3 10*3/uL (ref 0.0–0.4)
ABS. LYMPHOCYTES: 2.8 10*3/uL (ref 0.8–3.5)
ABS. MONOCYTES: 0.7 10*3/uL (ref 0.0–1.0)
ABS. NEUTROPHILS: 7.1 10*3/uL (ref 1.8–8.0)
ABSOLUTE NRBC: 0 10*3/uL (ref 0.00–0.01)
BAND NEUTROPHILS: 2 % (ref 0–6)
BASOPHILS: 1 % (ref 0–1)
BLASTS: 0 %
EOSINOPHILS: 3 % (ref 0–7)
HCT: 27.4 % — ABNORMAL LOW (ref 35.0–47.0)
HGB: 8.4 g/dL — ABNORMAL LOW (ref 11.5–16.0)
LYMPHOCYTES: 25 % (ref 12–49)
MCH: 28.4 PG (ref 26.0–34.0)
MCHC: 30.7 g/dL (ref 30.0–36.5)
MCV: 92.6 FL (ref 80.0–99.0)
METAMYELOCYTES: 0 %
MONOCYTES: 6 % (ref 5–13)
MYELOCYTES: 0 %
NEUTROPHILS: 63 % (ref 32–75)
NRBC: 0 PER 100 WBC
OTHER CELL: 0
PLATELET: 86 10*3/uL — ABNORMAL LOW (ref 150–400)
PROMYELOCYTES: 0 %
RBC: 2.96 M/uL — ABNORMAL LOW (ref 3.80–5.20)
RDW: 21.9 % — ABNORMAL HIGH (ref 11.5–14.5)
WBC: 11 10*3/uL (ref 3.6–11.0)

## 2009-05-14 LAB — METABOLIC PANEL, COMPREHENSIVE
A-G Ratio: 0.5 — ABNORMAL LOW (ref 1.1–2.2)
ALT (SGPT): 41 U/L (ref 12–78)
AST (SGOT): 28 U/L (ref 15–37)
Albumin: 2 g/dL — ABNORMAL LOW (ref 3.5–5.0)
Alk. phosphatase: 107 U/L (ref 50–136)
Anion gap: 8 mmol/L (ref 5–15)
BUN/Creatinine ratio: 19 (ref 12–20)
BUN: 13 MG/DL (ref 6–20)
Bilirubin, total: 2.1 MG/DL — ABNORMAL HIGH (ref 0.2–1.0)
CO2: 20 MMOL/L — ABNORMAL LOW (ref 21–32)
Calcium: 9 MG/DL (ref 8.5–10.1)
Chloride: 116 MMOL/L — ABNORMAL HIGH (ref 97–108)
Creatinine: 0.7 MG/DL (ref 0.6–1.3)
GFR est AA: >60 mL/min/{1.73_m2} (ref >60)
GFR est non-AA: >60 mL/min/{1.73_m2} (ref >60)
Globulin: 4 g/dL (ref 2.0–4.0)
Glucose: 118 MG/DL — ABNORMAL HIGH (ref 65–100)
Potassium: 4 MMOL/L (ref 3.5–5.1)
Protein, total: 6 g/dL — ABNORMAL LOW (ref 6.4–8.2)
Sodium: 144 MMOL/L (ref 136–145)

## 2009-05-14 LAB — LIPID PANEL
CHOL/HDL Ratio: 9.6 — ABNORMAL HIGH (ref 0–5.0)
Cholesterol, total: 125 MG/DL (ref <200)
HDL Cholesterol: 13 MG/DL
LDL, calculated: 88.8 MG/DL (ref 0–100)
Triglyceride: 116 MG/DL (ref <150)
VLDL, calculated: 23.2 MG/DL

## 2009-05-14 LAB — GLUCOSE, POC
Glucose (POC): 168 mg/dL — ABNORMAL HIGH (ref 65–105)
Glucose (POC): 142 mg/dL — ABNORMAL HIGH (ref 65–105)
Glucose (POC): 139 mg/dL — ABNORMAL HIGH (ref 65–105)

## 2009-05-14 MED ADMIN — lactulose (CHRONULAC) solution 20 g: ORAL | @ 02:00:00 | NDC 00121457730

## 2009-05-14 MED ADMIN — sodium chloride (NS) flush 10 mL: @ 14:00:00 | NDC 87701099893

## 2009-05-14 MED ADMIN — enoxaparin (LOVENOX) injection 40 mg: SUBCUTANEOUS | @ 14:00:00 | NDC 00075062040

## 2009-05-14 MED ADMIN — sodium chloride (NS) flush 10 mL: @ 19:00:00 | NDC 87701099893

## 2009-05-14 MED ADMIN — vancomycin (VANCOCIN) 1 g in 0.9% sodium chloride 250 mL IVPB: INTRAVENOUS | @ 07:00:00 | NDC 63323031461

## 2009-05-14 MED ADMIN — dextrose 5% infusion: INTRAVENOUS | @ 22:00:00 | NDC 00409792209

## 2009-05-14 MED ADMIN — levothyroxine (SYNTHROID) injection 25 mcg: INTRAVENOUS | @ 14:00:00 | NDC 63323024710

## 2009-05-14 MED ADMIN — albuterol/ipratropium (DUONEB) neb solution: RESPIRATORY_TRACT | @ 11:00:00 | NDC 00487980101

## 2009-05-14 MED ADMIN — pantoprazole (PROTONIX) injection 40 mg: INTRAVENOUS | @ 14:00:00 | NDC 00409488810

## 2009-05-14 MED ADMIN — morphine injection 1 mg: INTRAVENOUS | @ 17:00:00 | NDC 00409176230

## 2009-05-14 MED ADMIN — morphine injection 1 mg: INTRAVENOUS | @ 10:00:00 | NDC 00409176230

## 2009-05-14 MED ADMIN — piperacillin-tazobactam (ZOSYN) 3.375 g in 0.9% sodium chloride (MBP/ADV) 100 mL MBP: INTRAVENOUS | @ 16:00:00 | NDC 00338055318

## 2009-05-14 MED ADMIN — sodium chloride (NS) flush 10 mL: @ 02:00:00 | NDC 87701099893

## 2009-05-14 MED ADMIN — sodium chloride (NS) flush 10 mL: @ 17:00:00 | NDC 87701099893

## 2009-05-14 MED ADMIN — levofloxacin (LEVAQUIN) 750 mg infusion: INTRAVENOUS | @ 14:00:00 | NDC 50458016601

## 2009-05-14 MED ADMIN — albuterol/ipratropium (DUONEB) neb solution: RESPIRATORY_TRACT | @ 04:00:00 | NDC 00487980101

## 2009-05-14 MED ADMIN — piperacillin-tazobactam (ZOSYN) 3.375 g in 0.9% sodium chloride (MBP/ADV) 100 mL MBP: INTRAVENOUS | @ 04:00:00 | NDC 00338055318

## 2009-05-14 MED ADMIN — sodium chloride (NS) flush 10 mL: @ 10:00:00 | NDC 87701099893

## 2009-05-14 MED ADMIN — thiamine (B-1) 100 mg in 0.9% sodium chloride 50 mL IVPB: INTRAVENOUS | @ 14:00:00 | NDC 63323001302

## 2009-05-14 MED ADMIN — aspirin chewable tablet 81 mg: ORAL | @ 18:00:00

## 2009-05-14 MED ADMIN — TPN ADULT - PERIPHERAL: INTRAVENOUS | @ 22:00:00 | NDC 00517293025

## 2009-05-14 MED ADMIN — metoprolol (LOPRESSOR) tablet 25 mg: ORAL | @ 04:00:00 | NDC 51079025501

## 2009-05-14 MED ADMIN — albuterol/ipratropium (DUONEB) neb solution: RESPIRATORY_TRACT | @ 20:00:00 | NDC 68115077615

## 2009-05-14 MED ADMIN — albuterol/ipratropium (DUONEB) neb solution: RESPIRATORY_TRACT | @ 16:00:00 | NDC 00487980101

## 2009-05-14 MED ADMIN — dextrose 5% infusion: INTRAVENOUS | @ 10:00:00 | NDC 00409792209

## 2009-05-14 MED ADMIN — aspirin chewable tablet 81 mg: ORAL | @ 14:00:00

## 2009-05-14 MED ADMIN — metoprolol (LOPRESSOR) tablet 25 mg: ORAL | @ 14:00:00 | NDC 51079025501

## 2009-05-14 MED ADMIN — lactulose (CHRONULAC) solution 20 g: ORAL | @ 14:00:00 | NDC 00121457730

## 2009-05-14 MED ADMIN — piperacillin-tazobactam (ZOSYN) 3.375 g in 0.9% sodium chloride (MBP/ADV) 100 mL MBP: INTRAVENOUS | @ 10:00:00 | NDC 00338055318

## 2009-05-14 MED ADMIN — piperacillin-tazobactam (ZOSYN) 3.375 g in 0.9% sodium chloride (MBP/ADV) 100 mL MBP: INTRAVENOUS | @ 22:00:00 | NDC 00338055318

## 2009-05-14 MED FILL — SALINE FLUSH INJECTION SYRINGE: INTRAMUSCULAR | Qty: 10

## 2009-05-14 MED FILL — ALBUTEROL SULFATE 0.083 % (0.83 MG/ML) SOLN FOR INHALATION: 2.5 mg /3 mL (0.083 %) | RESPIRATORY_TRACT | Qty: 1

## 2009-05-14 MED FILL — METOPROLOL TARTRATE 25 MG TAB: 25 mg | ORAL | Qty: 1

## 2009-05-14 MED FILL — ZOSYN 3.375 GRAM INTRAVENOUS SOLUTION: 3.375 gram | INTRAVENOUS | Qty: 3.38

## 2009-05-14 MED FILL — LEVOTHYROXINE 200 MCG IJ SOLR: 200 mcg | INTRAVENOUS | Qty: 5

## 2009-05-14 MED FILL — LOVENOX 40 MG/0.4 ML SUBCUTANEOUS SYRINGE: 40 mg/0.4 mL | SUBCUTANEOUS | Qty: 1

## 2009-05-14 MED FILL — SALINE FLUSH INJECTION SYRINGE: INTRAMUSCULAR | Qty: 20

## 2009-05-14 MED FILL — MORPHINE 2 MG/ML INJECTION: 2 mg/mL | INTRAMUSCULAR | Qty: 1

## 2009-05-14 MED FILL — VANCOMYCIN 10 GRAM IV SOLR: 10 gram | INTRAVENOUS | Qty: 1

## 2009-05-14 MED FILL — SALINE FLUSH INJECTION SYRINGE: INTRAMUSCULAR | Qty: 40

## 2009-05-14 MED FILL — SODIUM CHLORIDE 0.9 % INJECTION: INTRAMUSCULAR | Qty: 10

## 2009-05-14 MED FILL — SALINE FLUSH INJECTION SYRINGE: INTRAMUSCULAR | Qty: 30

## 2009-05-14 MED FILL — LACTULOSE 10 GRAM/15 ML ORAL SOLN: 10 gram/15 mL | ORAL | Qty: 30

## 2009-05-14 MED FILL — AMINOSYN II 10 % IV: 10 % | INTRAVENOUS | Qty: 685.1

## 2009-05-14 MED FILL — ASPIRIN 81 MG CHEWABLE TAB: 81 mg | ORAL | Qty: 1

## 2009-05-14 MED FILL — THIAMINE 100 MG/ML INJECTION: 100 mg/mL | INTRAMUSCULAR | Qty: 1

## 2009-05-14 NOTE — Progress Notes (Signed)
 Formatting of this note might be different from the original.  Dr Dominique paged and made aware of CT Results.  No new orders received.  Electronically signed by Waddell Slough D at 05/14/2009  4:58 PM EDT

## 2009-05-14 NOTE — Progress Notes (Signed)
 Formatting of this note might be different from the original.  Bedside and Verbal shift change report given to oncoming nurse.    Electronically signed by Waddell Slough D at 05/14/2009  7:32 PM EDT

## 2009-05-14 NOTE — Progress Notes (Signed)
 Formatting of this note might be different from the original.  Chart reviewed and nursing OK visit.  Attempted to co-tx with PT.  Patient refusing therapy despite max encouragement.  Patient c/o pain but had received morphine  about 60 minutes prior.  Nursing reported pain meds not given on schedule last night which might be affecting patent today.  Repositioned patient in bed then patient going off floor for CT.  Will follow up tomorrow.  Electronically signed by Milissa Wanda DEL, OTR/L at 05/14/2009  2:56 PM EDT

## 2009-05-14 NOTE — Progress Notes (Signed)
 Formatting of this note might be different from the original.  Attempted to treat pt but pt c/o pain and did not want to get up. Encouraged pt to try to atleast sit up with PT but pt still stated too much pain.  Nursing informed and stated that she is probably having an increase in pain due to her pain meds not being given on schedule last night .  Will follow up tomorrow or Monday.  Electronically signed by Jonette Shadow A at 05/14/2009  2:31 PM EDT

## 2009-05-14 NOTE — Progress Notes (Signed)
 Formatting of this note might be different from the original.  Stable from cardiac standpoint--currently off floor for CT scan.  Remains on beta blocker, ASA.  Lipid panel with very low HDL, LDL 80's--?Niaspan vs low dose statin when Hepatic issues/GI situation stabilized--will defer this to Hospitalist.  Had discussed eventual ischemic w/u with pt yesterday--not currently interested in cardiac cath (nor is she a good candidate for invasive rx).  Dr. Deronda will be on call for me this weekend--call if ?'s, otherwise I will check back on Monday.  Electronically signed by Vonzell Norleen ORN, MD at 05/14/2009  2:39 PM EDT

## 2009-05-14 NOTE — Progress Notes (Signed)
 Formatting of this note might be different from the original.  RN tried to put NG tube in,  pt refused and asked to try tomorrow will notify MD.   Electronically signed by Jonetta Ivan, RN at 05/15/2009 12:06 AM EDT

## 2009-05-14 NOTE — Progress Notes (Signed)
 Formatting of this note might be different from the original.  Bedside and Verbal shift change report received from off going nurse.    Electronically signed by Waddell Slough D at 05/14/2009  8:29 AM EDT

## 2009-05-14 NOTE — Progress Notes (Signed)
 Formatting of this note might be different from the original.  NG Tube removed from left nares.  Pt tolerated without problems.  Electronically signed by Waddell Slough D at 05/14/2009  4:47 PM EDT

## 2009-05-14 NOTE — Progress Notes (Signed)
 Formatting of this note might be different from the original.  Bedside and Verbal shift change report received from off going  nurse.    Electronically signed by Jonetta Ivan, RN at 05/14/2009  7:40 PM EDT

## 2009-05-14 NOTE — Progress Notes (Signed)
 Formatting of this note is different from the original.  Problem: Dysphagia (Adult)  Goal: *Acute Goals and Plan of Care (Insert Text)  Speech Therapy Goals  Initiated 05/10/09  1. Patient will participate in re-evaluation of swallow within 7 days  2. Patient will tolerate least restrictive diet without signs or symptoms of aspiration within 7 days  3. Patient will participate in MBS as needed/appropriate within 7 days   SPEECH LANGUAGE PATHOLOGY DYSPHAGIA TREATMENT    NAME : Breanna Lester  AGE:  48 y.o.  GENDER:  female  DATE:  05/14/2009  PRIMARY DIAGNOSIS:  AMS, hypokalemia, lft upper extremity pain  AMS, hypokalemia, lft upper extremity pain  AMS, hypokalemia, lft upper extremity pain <principal problem not specified>    SUBJECTIVE:   Patient stated ?  I could eat that up. re: applesauce.    OBJECTIVE:   Cognitive and Communication Status:  Level of Consciousness: Alert  Orientation Level: Oriented to person  Cognition: Follows commands  Perception: Appears intact  Perseveration: No perseveration noted  Safety/Judgement: Not assessed    Dysphagia Treatment:  Oral Assessment:  Oral Assessment  Labial: No impairment  Dentition: Extractions  Oral Hygiene:  dried secretions of tongue and lower teeth  Lingual: No impairment  P.O. Trials:  Patient Position: Upright in bed    The patient was given the following:  Consistency Presented: Thin liquid;Puree;Ice chips  How Presented: SLP-fed/presented;Spoon    ORAL PHASE:  Bolus Acceptance: No impairment  Bolus Formation/Control: Impaired  Propulsion: Delayed (# of seconds)2-3  Type of Impairment:  (chewed ice slowly)  Oral Residue: None    PHARYNGEAL PHASE:  Initiation of Swallow: Delayed (# of seconds)2-3  Laryngeal Elevation: Functional  Aspiration Signs/Symptoms: Change respiration;Weak cough  Vocal Quality: Hoarse;Breathy (low volume)  Cues for Modifications: Moderate  Effective Modifications: None  Pain:  No pain  After treatment:  [X]    Patient left in NAD  [X]    Call  bell left within reach  [ ]    Caregiver present  [ ]    Patient left up in chair, nursing staff notified    ASSESSMENT AND PROGRESSION TOWARD GOALS:   Patient seen today for swallow tx. She has labored breathing with use of accessory muscles. Sats in 90s with no O2 on. She has mild to mod s/s aspiration with delayed cough after ice and water . RR increased after each bolus.    Patient?s progression toward goals is as follows:  [ ]    Improving appropriately and progressing toward goals  [ ]    Improving slowly and progressing toward goals  [ ]    Not making progress toward goals and plan of care will be adjusted    PLAN OF CARE:   Patient continues to benefit from skilled intervention to address the above impairments.    Recommendations and Planned Interventions:  Any new recommendations or planned interventions including recommended diet changes were discussed with:  [X]   Patient   [ ]    Family      [X]    Nursing  [ ]    Provider  [ ]   Posted safety precautions in patient?s room    Communication/Collaboration:  The patient?s plan of care was discussed with:  Registered Nurse    Rudell LITTIE Marshall, SLP    Time Calculation: 13 mins          Electronically signed by Marshall Rudell LITTIE, SLP at 05/14/2009  3:44 PM EDT

## 2009-05-14 NOTE — Progress Notes (Signed)
 Formatting of this note is different from the original.       Hospitalist Progress Note         NAME: Breanna Lester        DOB:  Feb 28, 1961        MRN:  [id number]      Assessment & Plan   Sepsis syndrome :underlying pneumonia, fever, leukocytosis, resolved  Recheck CT chest. D/C vanco and LVQ. Continue Zosyn.  No evidence of UTI or ascites(SBP), has an umbilical hernia that is not     reducible but overall adbomen is soft, has RUQ pain, CT gallbladder sludge and thickenned wall, no fever now    Acute respiratory failure:got extubated doing well off the ventilator    Altered mental status : multifactorial Sepsis, hypernatremia, ARF, hyperamonemia   has improved AMS, trying to communicate now     Hypernatremia: stable.    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    Nutrition: Currently on TPN, will discuss with nutrition and speech to consider PO feedings. We will try swallowing trial.     Subjective:     48yo caucasian female    Chief Complaint:  Some belly pain , awake , follows commands    Discussed with RN events overnight.     Review of Systems:  Fever/chills y   Cough N   Sputum    N   SOB/DOE  N   Chest Pain N   Abdominal Pain y   Diarrhea N   Constipation N   Nausea/Vomiting N   Dysuria N   Tolerating PT N   Tolerating Diet N   Could not obtain due to AMS vs intubation         Objective:   Patient extubated , follow commands, decision for PEG was revoked patient on TPN, I will await before restarting enteral feed patient c/o RUQ pain    VITALS:   Last 24hrs VS reviewed since prior progress note. Most recent are:  Visit Vitals   Item Reading   ? BP 129/71   ? Pulse 67   ? Temp 98.1 F (36.7 C)   ? Resp 22   ? Ht 5' 2 (1.575 m)   ? Wt 144 lb 13.5 oz (65.7 kg)   ? SpO2 93%   ? PF 40L/min     Intake/Output Summary (Last 24 hours) at 05/14/09 1246  Last data filed at 05/14/09 1100   Gross per 24 hour   Intake 4796.28 ml   Output   2850 ml   Net 1946.28 ml       Telemetry Reviewed:     PHYSICAL EXAM:  General: Extubated, awake ,   HEENT: Pupils unequal, R 3mm, L 2mm  Lungs:  Bilateral rales, ronchi   Heart:  Regular rhythm, tachycardic, SEM  Abdomen: Soft, Non distended, hypoactive BS,RUQ pain,  no  rebound, umbilical hernia non reducible,  Extremities: No edema, pulses present, brace in left arm  Neurologic: GCS [id number], no gross deficit  Psych: AMS    Lab Data Reviewed: (see below)    Medications Reviewed: (see below)    PMH/SH reviewed - no change compared to H&P  ______________________________________________________________________      Care Plan discussed with:  Patient x   Family    RN x   Care Manager    Consultant/Specialist      >50% of visit spent in counseling and coordination of care       Prophylaxis:  GI PPI   DVT lovenox      Disposition:   Home with Family    HH/PT/OT/RN    SNF/LTC x   [facility]      ______________________________________________________________________  Attending Physician: Kerney Meter, MD   _____________________________________________________________________________________________________  Procedures: see electronic medical records for all procedures/Xrays and details  which were not copied into this note but were reviewed prior to creation of Plan.      LABS:  Recent Labs   Basename 05/14/09 0600 05/13/09 0130   ? WBC 11.0 9.6   ? HGB 8.4* 8.1*   ? HCT  27.4* 26.9*   ? PLT 86* 84*     Recent Labs   Basename 05/14/09 0600 05/13/09 0130 05/12/09 0400   ? NA 144 150* 154*   ? K 4.0 3.5 3.7   ? CL 116* 119* 122*   ? CO2 20* 21 22   ? BUN 13 13 14    ? CREA 0.7 0.7 0.7   ? GLU 118* 154* 153*   ? CA 9.0 9.1 8.9   ? MG -- -- --   ? PHOS -- -- --   ? URICA -- -- --     Recent Labs   Basename 05/14/09 0600 05/13/09 0130 05/12/09 0400   ? SGOT 28 26 27    ? GPT 41 47 52   ? AP 107 95 107   ? TBIL 2.1* 2.3* 2.7*   ? TP 6.0* 5.7* 5.7*   ? ALB 2.0* 1.9* 2.0*   ? GLOB 4.0 3.8 3.7   ? GGT -- -- --   ? AML -- -- --   ? LPSE -- -- --     No results found for this basename: INR:3,PTP:3,APTT:3, in the last 72 hours     No results found for this basename: FE:2,TIBC:2,PSAT:2,FERR:2, in the last 72 hours     No results found for this basename: PH:2,PCO2:2,PO2:2, in the last 72 hours    No results found for this basename: CPK:3,CKMB:3,TROPONINI:3, in the last 72 hours    Lab Results   Component Value Date/Time    POC GLUCOSE 139 05/14/2009 11:24 AM    POC GLUCOSE 142 05/14/2009  7:38 AM    POC GLUCOSE 168 05/14/2009 12:15 AM    POC GLUCOSE 138 05/13/2009  6:03 PM    POC GLUCOSE 132 05/13/2009 12:15 PM     Lab Results   Component Value Date/Time    Color DARK YELLOW 05/09/2009 11:05 AM    Appearance CLEAR 05/09/2009 11:05 AM    Specific gravity 1.010 10/29/2008 11:00 AM    Specific gravity 1.024 05/09/2009 11:05 AM    pH 6.0 05/09/2009 11:05 AM    Protein TRACE 05/09/2009 11:05 AM    Glucose NEGATIVE  05/09/2009 11:05 AM    Ketone NEGATIVE  05/09/2009 11:05 AM    Bilirubin NEGATIVE  04/30/2009 12:14  AM    Urobilinogen 0.2 05/09/2009 11:05 AM    Nitrites NEGATIVE  05/09/2009 11:05 AM    Leukocyte Esterase SMALL 05/09/2009 11:05 AM    Epithelial cells 0-5 05/09/2009 11:05 AM    Bacteria NEGATIVE  05/09/2009 11:05 AM    WBC 5-10 05/09/2009 11:05 AM    RBC 10-20 05/09/2009 11:05 AM     MEDICATIONS:  Current facility-administered medications   Medication Dose Route Frequency   ? enoxaparin  (LOVENOX ) injection 40 mg  40 mg  SubCUTAneous Q24H   ? metoprolol  (LOPRESSOR ) tablet 25 mg  25 mg Oral Q12H   ? TPN ADULT - PERIPHERAL     IntraVENous CONTINUOUS   ? vancomycin (VANCOCIN) 1 g in 0.9% sodium chloride  250 mL IVPB  1,000 mg IntraVENous Q12H   ? influenza vaccine tr-s 10 (PF) (FLUZONE) injection 0.5 mL  0.5 mL IntraMUSCular ONCE   ? dextrose 5% infusion    IntraVENous CONTINUOUS   ? TPN ADULT - PERIPHERAL     IntraVENous CONTINUOUS   ? lactulose (CHRONULAC) solution 20 g  20 g Oral BID   ? aspirin chewable tablet 81 mg  81 mg Oral DAILY   ? levothyroxine (SYNTHROID) injection 25 mcg  25 mcg IntraVENous DAILY   ? levofloxacin (LEVAQUIN) 750 mg infusion   750 mg IntraVENous Q24H   ? piperacillin-tazobactam (ZOSYN) 3.375 g in 0.9% sodium chloride  (MBP/ADV) 100 mL MBP  3.375 g IntraVENous Q6H   ? albumin human 5% (BUMINATE) solution 25 g  25 g IntraVENous Q4H PRN   ? morphine  injection 1 mg  1 mg IntraVENous PRN   ? pantoprazole  (PROTONIX ) injection 40 mg  40 mg IntraVENous DAILY   ? albuterol /ipratropium (DUONEB ) neb solution   1 Dose Nebulization Q4HWA RT   ? midazolam (VERSED) injection 2-4 mg  2-4 mg IntraVENous Q1H PRN   ? sodium chloride  (NS) flush 10 mL  10 mL InterCATHeter Q8H   ? sodium chloride  (NS) flush 10 mL  10 mL InterCATHeter PRN   ? heparin (porcine) pf 300 Units  300 Units InterCATHeter PRN   ? sodium chloride  (NS) flush 20 mL  20 mL InterCATHeter PRN   ? acetaminophen  (TYLENOL ) suppository 650 mg  650 mg Rectal Q6H PRN   ? albuterol  (PROVENTIL  VENTOLIN ) nebulizer solution 2.5 mg  2.5 mg Nebulization Q2H PRN   ? thiamine (B-1) 100 mg in 0.9% sodium chloride  50 mL IVPB  100 mg IntraVENous DAILY   ? saline peripheral flush 5 mL  5 mL InterCATHeter PRN       Electronically signed by Dominique Cons, MD at 05/14/2009  1:06 PM EDT

## 2009-05-14 NOTE — Progress Notes (Signed)
 Formatting of this note might be different from the original.  Dr. Dwight was notified regarding pt's NG tube being DC'd this afternoon and pt failed swallow screening test, was asked to change PO med's however,  gave an order to put NG back in will continue to monitor.    Electronically signed by Jonetta Ivan, RN at 05/15/2009 12:04 AM EDT

## 2009-05-14 NOTE — Progress Notes (Signed)
Bedside and Verbal shift change report received from off going  nurse.

## 2009-05-14 NOTE — Progress Notes (Signed)
RN tried to put NG tube in,  pt refused and asked to try tomorrow will notify MD.

## 2009-05-14 NOTE — Progress Notes (Signed)
Dr. Rolly Salter was notified regarding pt's NG tube being DC'd this afternoon and pt failed swallow screening test, was asked to change PO med's however,  gave an order to put NG back in will continue to monitor.

## 2009-05-14 NOTE — Progress Notes (Signed)
Dr Ninfa Linden paged and made aware of CT Results.  No new orders received.

## 2009-05-14 NOTE — Progress Notes (Signed)
Hospitalist Progress Note         NAME: Breanna Lester        DOB:  Mar 15, 1961        MRN:  [id number]      Assessment & Plan   Sepsis syndrome :underlying pneumonia, fever, leukocytosis, resolved  Recheck CT chest. D/C vanco and LVQ. Continue Zosyn.  ????????????????????????No evidence of UTI or ascites(SBP), has an umbilical hernia that is not     reducible but overall adbomen is soft, has RUQ pain, CT gallbladder sludge and thickenned wall, no fever now    Acute respiratory failure:got extubated doing well off the ventilator    Altered mental status : multifactorial Sepsis, hypernatremia, ARF, hyperamonemia   has improved AMS, trying to communicate now     Hypernatremia: stable.    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    Nutrition: Currently on TPN, will discuss with nutrition and speech to consider PO feedings. We will try swallowing trial.       Subjective:     48yo caucasian female    Chief Complaint:  Some belly pain , awake , follows commands    Discussed with RN events overnight.     Review of Systems:  Fever/chills y   Cough N   Sputum    N   SOB/DOE  N   Chest Pain N   Abdominal Pain y   Diarrhea N   Constipation N   Nausea/Vomiting N   Dysuria N   Tolerating PT N   Tolerating Diet N   Could not obtain due to AMS vs intubation         Objective:    Patient extubated , follow commands, decision for PEG was revoked patient on TPN, I will await before restarting enteral feed patient c/o RUQ pain    VITALS:   Last 24hrs VS reviewed since prior progress note. Most recent are:  Visit Vitals   Item Reading   ??? BP 129/71   ??? Pulse 67   ??? Temp 98.1 ??F (36.7 ??C)   ??? Resp 22   ??? Ht 5\' 2"  (1.575 m)   ??? Wt 144 lb 13.5 oz (65.7 kg)   ??? SpO2 93%   ??? PF 40L/min           Intake/Output Summary (Last 24 hours) at 05/14/09 1246  Last data filed at 05/14/09 1100   Gross per 24 hour   Intake 4796.28 ml   Output   2850 ml   Net 1946.28 ml        Telemetry Reviewed:     PHYSICAL EXAM:  General: Extubated, awake ,   HEENT: Pupils unequal, R 3mm, L 2mm  Lungs:  Bilateral rales, ronchi   Heart:  Regular rhythm, tachycardic, SEM  Abdomen: Soft, Non distended, hypoactive BS,RUQ pain,  no rebound, umbilical hernia non reducible,  Extremities: No edema, pulses present, brace in left arm  Neurologic:?? GCS [id number], no gross deficit  Psych:???? AMS    Lab Data Reviewed: (see below)    Medications Reviewed: (see below)    PMH/SH reviewed - no change compared to H&P  ______________________________________________________________________        Care Plan discussed with:  Patient x   Family    RN x   Care Manager    Consultant/Specialist      >50% of visit spent in counseling and coordination of care       Prophylaxis:  GI PPI   DVT lovenox     Disposition:   Home with Family    HH/PT/OT/RN    SNF/LTC x   [facility]      ______________________________________________________________________  Attending Physician: Ayesha Rumpf, MD   _____________________________________________________________________________________________________  Procedures: see electronic medical records for all procedures/Xrays and details  which were not copied into this note but were reviewed prior to creation of Plan.      LABS:  Recent Labs   Basename 05/14/09 0600 05/13/09 0130   ??? WBC 11.0 9.6   ??? HGB 8.4* 8.1*    ??? HCT 27.4* 26.9*   ??? PLT 86* 84*         Recent Labs   Basename 05/14/09 0600 05/13/09 0130 05/12/09 0400   ??? NA 144 150* 154*   ??? K 4.0 3.5 3.7   ??? CL 116* 119* 122*   ??? CO2 20* 21 22   ??? BUN 13 13 14    ??? CREA 0.7 0.7 0.7   ??? GLU 118* 154* 153*   ??? CA 9.0 9.1 8.9   ??? MG -- -- --   ??? PHOS -- -- --   ??? URICA -- -- --         Recent Labs   Basename 05/14/09 0600 05/13/09 0130 05/12/09 0400   ??? SGOT 28 26 27    ??? GPT 41 47 52   ??? AP 107 95 107   ??? TBIL 2.1* 2.3* 2.7*   ??? TP 6.0* 5.7* 5.7*   ??? ALB 2.0* 1.9* 2.0*   ??? GLOB 4.0 3.8 3.7   ??? GGT -- -- --   ??? AML -- -- --   ??? LPSE -- -- --           No results found for this basename: INR:3,PTP:3,APTT:3, in the last 72 hours     No results found for this basename: FE:2,TIBC:2,PSAT:2,FERR:2, in the last 72 hours     No results found for this basename: PH:2,PCO2:2,PO2:2, in the last 72 hours      No results found for this basename: CPK:3,CKMB:3,TROPONINI:3, in the last 72 hours    Lab Results   Component Value Date/Time    POC GLUCOSE 139 05/14/2009 11:24 AM    POC GLUCOSE 142 05/14/2009  7:38 AM    POC GLUCOSE 168 05/14/2009 12:15 AM    POC GLUCOSE 138 05/13/2009  6:03 PM    POC GLUCOSE 132 05/13/2009 12:15 PM         Lab Results   Component Value Date/Time    Color DARK YELLOW 05/09/2009 11:05 AM    Appearance CLEAR 05/09/2009 11:05 AM    Specific gravity 1.010 10/29/2008 11:00 AM    Specific gravity 1.024 05/09/2009 11:05 AM    pH 6.0 05/09/2009 11:05 AM    Protein TRACE 05/09/2009 11:05 AM    Glucose NEGATIVE  05/09/2009 11:05 AM  Ketone NEGATIVE  05/09/2009 11:05 AM    Bilirubin NEGATIVE  04/30/2009 12:14 AM    Urobilinogen 0.2 05/09/2009 11:05 AM    Nitrites NEGATIVE  05/09/2009 11:05 AM    Leukocyte Esterase SMALL 05/09/2009 11:05 AM    Epithelial cells 0-5 05/09/2009 11:05 AM    Bacteria NEGATIVE  05/09/2009 11:05 AM    WBC 5-10 05/09/2009 11:05 AM    RBC 10-20 05/09/2009 11:05 AM           MEDICATIONS:  Current facility-administered medications   Medication Dose Route Frequency    ??? enoxaparin (LOVENOX) injection 40 mg  40 mg SubCUTAneous Q24H   ??? metoprolol (LOPRESSOR) tablet 25 mg  25 mg Oral Q12H   ??? TPN ADULT - PERIPHERAL     IntraVENous CONTINUOUS   ??? vancomycin (VANCOCIN) 1 g in 0.9% sodium chloride 250 mL IVPB  1,000 mg IntraVENous Q12H   ??? influenza vaccine tr-s 10 (PF) (FLUZONE) injection 0.5 mL  0.5 mL IntraMUSCular ONCE   ??? dextrose 5% infusion    IntraVENous CONTINUOUS   ??? TPN ADULT - PERIPHERAL     IntraVENous CONTINUOUS   ??? lactulose (CHRONULAC) solution 20 g  20 g Oral BID   ??? aspirin chewable tablet 81 mg  81 mg Oral DAILY   ??? levothyroxine (SYNTHROID) injection 25 mcg  25 mcg IntraVENous DAILY   ??? levofloxacin (LEVAQUIN) 750 mg infusion   750 mg IntraVENous Q24H   ??? piperacillin-tazobactam (ZOSYN) 3.375 g in 0.9% sodium chloride (MBP/ADV) 100 mL MBP  3.375 g IntraVENous Q6H   ??? albumin human 5% (BUMINATE) solution 25 g  25 g IntraVENous Q4H PRN   ??? morphine injection 1 mg  1 mg IntraVENous PRN   ??? pantoprazole (PROTONIX) injection 40 mg  40 mg IntraVENous DAILY   ??? albuterol/ipratropium (DUONEB) neb solution   1 Dose Nebulization Q4HWA RT   ??? midazolam (VERSED) injection 2-4 mg  2-4 mg IntraVENous Q1H PRN   ??? sodium chloride (NS) flush 10 mL  10 mL InterCATHeter Q8H   ??? sodium chloride (NS) flush 10 mL  10 mL InterCATHeter PRN   ??? heparin (porcine) pf 300 Units  300 Units InterCATHeter PRN   ??? sodium chloride (NS) flush 20 mL  20 mL InterCATHeter PRN   ??? acetaminophen (TYLENOL) suppository 650 mg  650 mg Rectal Q6H PRN   ??? albuterol (PROVENTIL VENTOLIN) nebulizer solution 2.5 mg  2.5 mg Nebulization Q2H PRN   ??? thiamine (B-1) 100 mg in 0.9% sodium chloride 50 mL IVPB  100 mg IntraVENous DAILY   ??? saline peripheral flush 5 mL  5 mL InterCATHeter PRN

## 2009-05-14 NOTE — Progress Notes (Signed)
Stable from cardiac standpoint--currently off floor for CT scan.  Remains on beta blocker, ASA.  Lipid panel with very low HDL, LDL 80's--?Niaspan vs low dose statin when Hepatic issues/GI situation stabilized--will defer this to Hospitalist.  Had discussed eventual ischemic w/u with pt yesterday--not currently interested in cardiac cath (nor is she a good candidate for invasive rx).  Dr. Tobey Bride will be on call for me this weekend--call if ?'s, otherwise I will check back on Monday.

## 2009-05-14 NOTE — Progress Notes (Signed)
Bedside and Verbal shift change report given to oncoming nurse.

## 2009-05-14 NOTE — Progress Notes (Signed)
Chart reviewed and nursing OK visit.  Attempted to co-tx with PT.  Patient refusing therapy despite max encouragement.  Patient c/o pain but had received morphine about 60 minutes prior.  Nursing reported pain meds not given on schedule last night which might be affecting patent today.  Repositioned patient in bed then patient going off floor for CT.  Will follow up tomorrow.

## 2009-05-14 NOTE — Progress Notes (Signed)
Bedside and Verbal shift change report received from off going nurse.

## 2009-05-14 NOTE — Progress Notes (Signed)
Attempted to treat pt but pt c/o pain and did not want to get up. Encouraged pt to try to atleast sit up with PT but pt still stated too much pain.  Nursing informed and stated that she is probably having an increase in pain due to her pain meds not being given on schedule last night .  Will follow up tomorrow or Monday.

## 2009-05-14 NOTE — Progress Notes (Signed)
NG Tube removed from left nares.  Pt tolerated without problems.

## 2009-05-14 NOTE — Progress Notes (Signed)
Problem: Dysphagia (Adult)  Goal: *Acute Goals and Plan of Care (Insert Text)  Speech Therapy Goals  Initiated 05/10/09  1. Patient will participate in re-evaluation of swallow within 7 days  2. Patient will tolerate least restrictive diet without signs or symptoms of aspiration within 7 days  3. Patient will participate in MBS as needed/appropriate within 7 days   SPEECH LANGUAGE PATHOLOGY DYSPHAGIA TREATMENT    NAME : Breanna Lester  AGE:  48 y.o.  GENDER:  female  DATE:  05/14/2009  PRIMARY DIAGNOSIS:  AMS, hypokalemia, lft upper extremity pain  AMS, hypokalemia, lft upper extremity pain  AMS, hypokalemia, lft upper extremity pain <principal problem not specified>      SUBJECTIVE:   Patient stated ???  I could eat that up." re: applesauce.      OBJECTIVE:   Cognitive and Communication Status:  Level of Consciousness: Alert  Orientation Level: Oriented to person  Cognition: Follows commands  Perception: Appears intact  Perseveration: No perseveration noted  Safety/Judgement: Not assessed    Dysphagia Treatment:  Oral Assessment:  Oral Assessment  Labial: No impairment  Dentition: Extractions  Oral Hygiene:  dried secretions of tongue and lower teeth  Lingual: No impairment  P.O. Trials:  Patient Position: Upright in bed    The patient was given the following:  Consistency Presented: Thin liquid;Puree;Ice chips  How Presented: SLP-fed/presented;Spoon    ORAL PHASE:  Bolus Acceptance: No impairment  Bolus Formation/Control: Impaired  Propulsion: Delayed (# of seconds)2-3  Type of Impairment:  (chewed ice slowly)  Oral Residue: None    PHARYNGEAL PHASE:  Initiation of Swallow: Delayed (# of seconds)2-3  Laryngeal Elevation: Functional  Aspiration Signs/Symptoms: Change respiration;Weak cough  Vocal Quality: Hoarse;Breathy (low volume)  Cues for Modifications: Moderate  Effective Modifications: None  Pain:  No pain  After treatment:  [X]    Patient left in NAD  [X]    Call bell left within reach  [ ]    Caregiver present   [ ]    Patient left up in chair, nursing staff notified      ASSESSMENT AND PROGRESSION TOWARD GOALS:   Patient seen today for swallow tx. She has labored breathing with use of accessory muscles. Sats in 90s with no O2 on. She has mild to mod s/s aspiration with delayed cough after ice and water. RR increased after each bolus.    Patient???s progression toward goals is as follows:  [ ]    Improving appropriately and progressing toward goals  [ ]    Improving slowly and progressing toward goals  [ ]    Not making progress toward goals and plan of care will be adjusted      PLAN OF CARE:   Patient continues to benefit from skilled intervention to address the above impairments.    Recommendations and Planned Interventions:  Any new recommendations or planned interventions including recommended diet changes were discussed with:  [X]   Patient   [ ]    Family      [X]    Nursing  [ ]    Provider  [ ]   Posted safety precautions in patient???s room    Communication/Collaboration:  The patient???s plan of care was discussed with:  Registered Nurse    Dennie Fetters, SLP    Time Calculation: 13 mins

## 2009-05-15 LAB — OCCULT BLOOD, STOOL: Occult blood, stool: POSITIVE — AB

## 2009-05-15 LAB — METABOLIC PANEL, COMPREHENSIVE
A-G Ratio: 0.5 — ABNORMAL LOW (ref 1.1–2.2)
ALT (SGPT): 36 U/L (ref 12–78)
AST (SGOT): 26 U/L (ref 15–37)
Albumin: 1.9 g/dL — ABNORMAL LOW (ref 3.5–5.0)
Alk. phosphatase: 109 U/L (ref 50–136)
Anion gap: 10 mmol/L (ref 5–15)
BUN/Creatinine ratio: 19 (ref 12–20)
BUN: 15 MG/DL (ref 6–20)
Bilirubin, total: 1.9 MG/DL — ABNORMAL HIGH (ref 0.2–1.0)
CO2: 18 MMOL/L — ABNORMAL LOW (ref 21–32)
Calcium: 9.1 MG/DL (ref 8.5–10.1)
Chloride: 106 MMOL/L (ref 97–108)
Creatinine: 0.8 MG/DL (ref 0.6–1.3)
GFR est AA: >60 mL/min/{1.73_m2} (ref >60)
GFR est non-AA: >60 mL/min/{1.73_m2} (ref >60)
Globulin: 4 g/dL (ref 2.0–4.0)
Glucose: 327 MG/DL — ABNORMAL HIGH (ref 65–100)
Potassium: 4.9 MMOL/L (ref 3.5–5.1)
Protein, total: 5.9 g/dL — ABNORMAL LOW (ref 6.4–8.2)
Sodium: 134 MMOL/L — ABNORMAL LOW (ref 136–145)

## 2009-05-15 LAB — CBC WITH AUTOMATED DIFF
ABS. BASOPHILS: 0 10*3/uL (ref 0.0–0.1)
ABS. EOSINOPHILS: 0.2 10*3/uL (ref 0.0–0.4)
ABS. LYMPHOCYTES: 1.5 10*3/uL (ref 0.8–3.5)
ABS. MONOCYTES: 0.7 10*3/uL (ref 0.0–1.0)
ABS. NEUTROPHILS: 8 10*3/uL (ref 1.8–8.0)
BASOPHILS: 0 % (ref 0–1)
EOSINOPHILS: 2 % (ref 0–7)
HCT: 26 % — ABNORMAL LOW (ref 35.0–47.0)
HGB: 7.9 g/dL — ABNORMAL LOW (ref 11.5–16.0)
LYMPHOCYTES: 14 % (ref 12–49)
MCH: 27.7 PG (ref 26.0–34.0)
MCHC: 30.4 g/dL (ref 30.0–36.5)
MCV: 91.2 FL (ref 80.0–99.0)
MONOCYTES: 7 % (ref 5–13)
NEUTROPHILS: 77 % — ABNORMAL HIGH (ref 32–75)
PLATELET: 92 10*3/uL — ABNORMAL LOW (ref 150–400)
RBC: 2.85 M/uL — ABNORMAL LOW (ref 3.80–5.20)
RDW: 21.9 % — ABNORMAL HIGH (ref 11.5–14.5)
WBC: 10.4 10*3/uL (ref 3.6–11.0)

## 2009-05-15 LAB — GLUCOSE, POC
Glucose (POC): 111 mg/dL — ABNORMAL HIGH (ref 65–105)
Glucose (POC): 141 mg/dL — ABNORMAL HIGH (ref 65–105)
Glucose (POC): 120 mg/dL — ABNORMAL HIGH (ref 65–105)

## 2009-05-15 LAB — C. DIFFICILE AG + TOXIN A/B: C. diff Ag + Toxin A/B: NEGATIVE

## 2009-05-15 MED ADMIN — piperacillin-tazobactam (ZOSYN) 3.375 g in 0.9% sodium chloride (MBP/ADV) 100 mL MBP: INTRAVENOUS | @ 18:00:00 | NDC 00338055318

## 2009-05-15 MED ADMIN — piperacillin-tazobactam (ZOSYN) 3.375 g in 0.9% sodium chloride (MBP/ADV) 100 mL MBP: INTRAVENOUS | @ 22:00:00 | NDC 00338055318

## 2009-05-15 MED ADMIN — piperacillin-tazobactam (ZOSYN) 3.375 g in 0.9% sodium chloride (MBP/ADV) 100 mL MBP: INTRAVENOUS | @ 11:00:00 | NDC 00338055318

## 2009-05-15 MED ADMIN — piperacillin-tazobactam (ZOSYN) 3.375 g in 0.9% sodium chloride (MBP/ADV) 100 mL MBP: INTRAVENOUS | @ 05:00:00 | NDC 00338055318

## 2009-05-15 MED ADMIN — levothyroxine (SYNTHROID) injection 25 mcg: INTRAVENOUS | @ 13:00:00 | NDC 63323024710

## 2009-05-15 MED ADMIN — pantoprazole (PROTONIX) injection 40 mg: INTRAVENOUS | @ 13:00:00 | NDC 00409488810

## 2009-05-15 MED ADMIN — TPN ADULT - PERIPHERAL: INTRAVENOUS | @ 22:00:00 | NDC 00517293025

## 2009-05-15 MED ADMIN — sodium chloride (NS) flush 10 mL: @ 19:00:00 | NDC 87701099893

## 2009-05-15 MED ADMIN — sodium chloride (NS) flush 10 mL: @ 13:00:00 | NDC 87701099893

## 2009-05-15 MED ADMIN — lactulose (CHRONULAC) solution 20 g: ORAL | @ 13:00:00 | NDC 00121457730

## 2009-05-15 MED ADMIN — albuterol/ipratropium (DUONEB) neb solution: RESPIRATORY_TRACT | @ 01:00:00 | NDC 00487980101

## 2009-05-15 MED ADMIN — metoprolol (LOPRESSOR) tablet 25 mg: ORAL | @ 13:00:00 | NDC 51079025501

## 2009-05-15 MED ADMIN — sodium chloride (NS) flush 10 mL: @ 04:00:00 | NDC 87701099893

## 2009-05-15 MED ADMIN — albuterol/ipratropium (DUONEB) neb solution: RESPIRATORY_TRACT | @ 11:00:00 | NDC 00487980101

## 2009-05-15 MED ADMIN — acetaminophen (TYLENOL) suppository 650 mg: RECTAL | @ 01:00:00 | NDC 00713016506

## 2009-05-15 MED ADMIN — albuterol/ipratropium (DUONEB) neb solution: RESPIRATORY_TRACT | @ 16:00:00 | NDC 00487980101

## 2009-05-15 MED ADMIN — albuterol/ipratropium (DUONEB) neb solution: RESPIRATORY_TRACT | @ 20:00:00 | NDC 00487980101

## 2009-05-15 MED ADMIN — aspirin chewable tablet 81 mg: ORAL | @ 13:00:00

## 2009-05-15 MED ADMIN — sodium chloride (NS) flush 10 mL: @ 11:00:00 | NDC 87701099893

## 2009-05-15 MED ADMIN — thiamine (B-1) 100 mg in 0.9% sodium chloride 50 mL IVPB: INTRAVENOUS | @ 13:00:00 | NDC 63323001302

## 2009-05-15 MED FILL — SALINE FLUSH INJECTION SYRINGE: INTRAMUSCULAR | Qty: 10

## 2009-05-15 MED FILL — ACEPHEN 650 MG RECTAL SUPPOSITORY: 650 mg | RECTAL | Qty: 1

## 2009-05-15 MED FILL — ZOSYN 3.375 GRAM INTRAVENOUS SOLUTION: 3.375 gram | INTRAVENOUS | Qty: 3.38

## 2009-05-15 MED FILL — LEVOTHYROXINE 200 MCG IJ SOLR: 200 mcg | INTRAVENOUS | Qty: 5

## 2009-05-15 MED FILL — SALINE FLUSH INJECTION SYRINGE: INTRAMUSCULAR | Qty: 20

## 2009-05-15 MED FILL — ASPIRIN 81 MG CHEWABLE TAB: 81 mg | ORAL | Qty: 1

## 2009-05-15 MED FILL — SODIUM CHLORIDE 0.9 % INJECTION: INTRAMUSCULAR | Qty: 10

## 2009-05-15 MED FILL — AMINOSYN II 10 % IV: 10 % | INTRAVENOUS | Qty: 685.1

## 2009-05-15 MED FILL — THIAMINE 100 MG/ML INJECTION: 100 mg/mL | INTRAMUSCULAR | Qty: 1

## 2009-05-15 MED FILL — METOPROLOL TARTRATE 25 MG TAB: 25 mg | ORAL | Qty: 1

## 2009-05-15 MED FILL — SALINE FLUSH INJECTION SYRINGE: INTRAMUSCULAR | Qty: 30

## 2009-05-15 MED FILL — ALBUTEROL SULFATE 0.083 % (0.83 MG/ML) SOLN FOR INHALATION: 2.5 mg /3 mL (0.083 %) | RESPIRATORY_TRACT | Qty: 1

## 2009-05-15 NOTE — Progress Notes (Signed)
 Formatting of this note might be different from the original.  Pt schedule to receive 2 units of PRBC's.  Pt unable to give consent.  Pt husband Jancie Kercher call and was made aware of pt order and that a consent form was needed by him for her to receive a transfusion.  Pt husband decline to give that order unless he spoke with the MD.  Pt husband stated that the last transfusion that was given pt had a reaction that lead to a related lung injury and she was in a coma.  Dr Dominique paged and made aware of pt husband concerns.  Order was given to D/C order to transfuse 2 units of PRBC's.  Electronically signed by Birmingham Slough D at 05/15/2009  4:39 PM EDT

## 2009-05-15 NOTE — Progress Notes (Signed)
 Formatting of this note might be different from the original.  On call physician notified regarding pt's refusal of NG tube insertion, no orders received will continue to monitor.    Electronically signed by Jonetta Ivan, RN at 05/15/2009 12:12 AM EDT

## 2009-05-15 NOTE — Progress Notes (Signed)
 Formatting of this note might be different from the original.  Bedside and Verbal shift change report given to oncoming nurse.    Electronically signed by Waddell Slough D at 05/15/2009  7:34 PM EDT

## 2009-05-15 NOTE — Progress Notes (Signed)
 Formatting of this note is different from the original.       Hospitalist Progress Note         NAME: Breanna Lester        DOB:  August 31, 1960        MRN:  [id number]      Assessment & Plan   Anemia, dark stools, diarrhea, check stool for CDT, occult bloood, consult GI.    Sepsis syndrome :underlying pneumonia, fever, leukocytosis, resolved  Recheck CT chest. D/C vanco and LVQ. Continue Zosyn.  No evidence of UTI or ascites(SBP), has an umbilical hernia that is not     reducible but overall adbomen is soft, has diarrhea with dark stools    Acute respiratory failure:got extubated doing well off the ventilator    Altered mental status : multifactorial Sepsis, hypernatremia, ARF, hyperamonemia   has improved AMS, trying to communicate now     Hypernatremia: resolved, now off IVF.    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 Ischemic workup when stable    Rhabdomyolisis: resolved    Left wrist Fracture: casted Ortho following the case    Hypothyroidism: c/w  L-Thyroxine 25mcg IV.    Nutrition: Currently on TPN, failed swallowing trial.     Subjective:     49yo caucasian female    Chief Complaint:  Some belly pain , awake , follows commands    Discussed with RN events overnight.     Review of Systems:  Fever/chills y   Cough N   Sputum    N   SOB/DOE  N   Chest Pain N   Abdominal Pain y   Diarrhea N   Constipation N   Nausea/Vomiting N   Dysuria N   Tolerating PT N   Tolerating Diet N   Could not obtain due to  AMS vs intubation        Objective:   Patient extubated , follow commands, decision for PEG was revoked patient on TPN, failed swallowing trial,     VITALS:   Last 24hrs VS reviewed since prior progress note. Most recent are:  Visit Vitals   Item Reading   ? BP 116/61   ? Pulse 62   ? Temp 97.7 F (36.5 C)   ? Resp 18   ? Ht 5' 2 (1.575 m)   ? Wt 144 lb 13.5 oz (65.7 kg)   ? SpO2 96%   ? PF 40L/min     Intake/Output Summary (Last 24 hours) at 05/15/09 0820  Last data filed at 05/15/09 9360   Gross per 24 hour   Intake 4540.46 ml   Output   1850 ml   Net 2690.46 ml       Telemetry Reviewed:     PHYSICAL EXAM:  General: Extubated, awake ,   HEENT: Pupils unequal, R 3mm, L 2mm  Lungs:  Bilateral rales, ronchi   Heart:  Regular rhythm, tachycardic, SEM  Abdomen: Soft, Non distended, hypoactive BS,RUQ pain,  no rebound, umbilical  hernia non reducible,  Extremities: No edema, pulses present, brace in left arm  Neurologic: GCS M5E4V4, no gross deficit  Psych: AMS    Lab Data Reviewed: (see below)    Medications Reviewed: (see below)    PMH/SH reviewed - no change compared to H&P  ______________________________________________________________________      Care Plan discussed with:  Patient x   Family    RN x   Care Manager    Consultant/Specialist      >50% of visit spent in counseling and coordination of care       Prophylaxis:  GI PPI   DVT SCD     Disposition:   Home with Family    HH/PT/OT/RN    SNF/LTC x   [facility]      ______________________________________________________________________  Attending Physician: Kerney Meter, MD   _____________________________________________________________________________________________________  Procedures: see electronic medical records for all procedures/Xrays and details  which were not copied into this note but were reviewed prior to creation of Plan.      LABS:  Recent Labs   Basename 05/15/09 0336 05/14/09 0600   ? WBC 10.4 11.0   ? HGB 7.9* 8.4*   ? HCT 26.0* 27.4*   ? PLT 92*  86*     Recent Labs   Basename 05/15/09 0336 05/14/09 0600 05/13/09 0130   ? NA 134* 144 150*   ? K 4.9 4.0 3.5   ? CL 106 116* 119*   ? CO2 18* 20* 21   ? BUN 15 13 13    ? CREA 0.8 0.7 0.7   ? GLU 327* 118* 154*   ? CA 9.1 9.0 9.1   ? MG -- -- --   ? PHOS -- -- --   ? URICA -- -- --     Recent Labs   Basename 05/15/09 0336 05/14/09 0600 05/13/09 0130   ? SGOT 26 28 26    ? GPT 36 41 47   ? AP 109 107 95   ? TBIL 1.9* 2.1* 2.3*   ? TP 5.9* 6.0* 5.7*   ? ALB 1.9* 2.0* 1.9*   ? GLOB 4.0 4.0 3.8   ? GGT -- -- --   ? AML -- -- --   ? LPSE -- -- --     No results found for this basename: INR:3,PTP:3,APTT:3, in the last 72 hours     No results found for this basename: FE:2,TIBC:2,PSAT:2,FERR:2, in the last 72 hours     No results found for this basename: PH:2,PCO2:2,PO2:2, in the last 72 hours    No results found for this basename: CPK:3,CKMB:3,TROPONINI:3, in the last 72 hours    Lab Results   Component Value Date/Time    POC GLUCOSE 120 05/15/2009  6:51 AM    POC GLUCOSE 141 05/15/2009  1:16 AM    POC GLUCOSE 111 05/14/2009  6:49 PM    POC GLUCOSE 139 05/14/2009 11:24 AM    POC GLUCOSE 142 05/14/2009  7:38 AM     Lab Results   Component Value Date/Time    Color DARK YELLOW 05/09/2009 11:05 AM    Appearance CLEAR 05/09/2009 11:05 AM    Specific gravity 1.010 10/29/2008 11:00 AM    Specific gravity 1.024 05/09/2009 11:05 AM    pH 6.0 05/09/2009 11:05 AM    Protein TRACE 05/09/2009 11:05 AM    Glucose NEGATIVE  05/09/2009 11:05 AM    Ketone NEGATIVE  05/09/2009 11:05 AM    Bilirubin NEGATIVE  04/30/2009 12:14  AM    Urobilinogen 0.2 05/09/2009 11:05 AM    Nitrites NEGATIVE  05/09/2009 11:05 AM    Leukocyte Esterase SMALL 05/09/2009 11:05 AM    Epithelial cells 0-5 05/09/2009 11:05 AM    Bacteria NEGATIVE  05/09/2009 11:05 AM    WBC 5-10 05/09/2009 11:05 AM    RBC 10-20 05/09/2009 11:05 AM     MEDICATIONS:  Current facility-administered medications   Medication Dose Route Frequency   ? aspirin chewable tablet 81 mg  81 mg Oral DAILY   ? TPN ADULT -  PERIPHERAL     IntraVENous CONTINUOUS   ? metoprolol  (LOPRESSOR ) tablet 25 mg  25 mg Oral Q12H   ? TPN ADULT - PERIPHERAL     IntraVENous CONTINUOUS   ? DISCONTD: enoxaparin  (LOVENOX ) injection 40 mg  40 mg SubCUTAneous Q24H   ? DISCONTD: vancomycin (VANCOCIN) 1 g in 0.9% sodium chloride  250 mL IVPB  1,000 mg IntraVENous Q12H   ? dextrose 5% infusion    IntraVENous CONTINUOUS   ? lactulose (CHRONULAC) solution 20 g  20 g Oral BID   ? levothyroxine (SYNTHROID) injection 25 mcg  25 mcg IntraVENous DAILY   ? DISCONTD: aspirin chewable tablet 81 mg  81 mg Oral DAILY   ? DISCONTD: levofloxacin (LEVAQUIN) 750 mg infusion   750 mg IntraVENous Q24H   ? piperacillin-tazobactam (ZOSYN) 3.375 g in 0.9% sodium chloride  (MBP/ADV) 100 mL MBP  3.375 g IntraVENous Q6H   ? albumin human 5% (BUMINATE) solution 25 g  25 g IntraVENous Q4H PRN   ? pantoprazole  (PROTONIX ) injection 40 mg  40 mg IntraVENous DAILY   ? albuterol /ipratropium (DUONEB ) neb solution   1 Dose Nebulization Q4HWA RT   ? sodium chloride  (NS) flush 10 mL  10 mL InterCATHeter Q8H   ? sodium chloride  (NS) flush 10 mL  10 mL InterCATHeter PRN   ? heparin (porcine) pf 300 Units  300 Units InterCATHeter PRN   ? sodium chloride  (NS) flush 20 mL  20 mL InterCATHeter PRN   ? DISCONTD: morphine  injection 1 mg  1 mg IntraVENous PRN   ? DISCONTD: midazolam (VERSED) injection 2-4 mg  2-4 mg IntraVENous Q1H PRN   ? acetaminophen  (TYLENOL ) suppository 650 mg  650 mg Rectal Q6H PRN   ? albuterol  (PROVENTIL  VENTOLIN ) nebulizer solution 2.5 mg  2.5 mg Nebulization Q2H PRN   ? thiamine (B-1) 100 mg in 0.9% sodium chloride  50 mL IVPB  100 mg IntraVENous DAILY   ? saline peripheral flush 5 mL  5 mL InterCATHeter PRN       Electronically signed by Dominique Cons, MD at 05/15/2009  8:36 AM EDT

## 2009-05-15 NOTE — Progress Notes (Signed)
 Formatting of this note might be different from the original.  Insurance claims handler Review of Student Work    05/15/2009  - Shift times - 0700 to1130    The student documentation of patient care for Breanna Lester has been reviewed and approved.  All medications have been administered under the direct supervision of the faculty or preceptor.    Electronically signed by Lanell Simonne HERO, RN at 05/15/2009 11:54 AM EDT

## 2009-05-15 NOTE — Progress Notes (Signed)
 Formatting of this note might be different from the original.  (R) TLC PICC dressing change per protocol. Old dressing was C/D/I. Old dressing removed and site cleaned per aseptic technique. TLC flushed with positive blood return x 2. Red port needs heparin per declot algorithm. Primary nurse Stoney) will instill.  New Bio patch, Stat lock and Tegaderm applied. Micro clave caps changed x 3. Patient tolerated procedure well. Primary nurse aware of PICC drsg change. RIGHT PICC remains out at 4 cm as stated on 05/10/09 during readjustment.  Reena Jerlyn Billow RN [facility] IV/PICC TEAM   Electronically signed by Georgeana Reena BIRCH, RN at 05/15/2009 12:10 PM EDT

## 2009-05-15 NOTE — Progress Notes (Signed)
Hospitalist Progress Note         NAME: Breanna Lester        DOB:  1960/09/15        MRN:  [id number]      Assessment & Plan   Anemia, dark stools, diarrhea, check stool for CDT, occult bloood, consult GI.    Sepsis syndrome :underlying pneumonia, fever, leukocytosis, resolved  Recheck CT chest. D/C vanco and LVQ. Continue Zosyn.  ????????????????????????No evidence of UTI or ascites(SBP), has an umbilical hernia that is not     reducible but overall adbomen is soft, has diarrhea with dark stools    Acute respiratory failure:got extubated doing well off the ventilator    Altered mental status : multifactorial Sepsis, hypernatremia, ARF, hyperamonemia   has improved AMS, trying to communicate now     Hypernatremia: resolved, now off IVF.    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 Ischemic workup when stable    Rhabdomyolisis: resolved    Left wrist Fracture: casted Ortho following the case    Hypothyroidism: c/w  L-Thyroxine IV.    Nutrition: Currently on TPN, failed swallowing trial.       Subjective:     48yo caucasian female    Chief Complaint:  Some belly pain , awake , follows commands    Discussed with RN events overnight.     Review of Systems:  Fever/chills y   Cough N   Sputum    N   SOB/DOE  N   Chest Pain N   Abdominal Pain y   Diarrhea N   Constipation N   Nausea/Vomiting N   Dysuria N   Tolerating PT N   Tolerating Diet N   Could not obtain due to AMS vs intubation         Objective:    Patient extubated , follow commands, decision for PEG was revoked patient on TPN, failed swallowing trial,     VITALS:   Last 24hrs VS reviewed since prior progress note. Most recent are:  Visit Vitals   Item Reading   ??? BP 116/61   ??? Pulse 62   ??? Temp 97.7 ??F (36.5 ??C)   ??? Resp 18   ??? Ht 5\' 2"  (1.575 m)   ??? Wt 144 lb 13.5 oz (65.7 kg)   ??? SpO2 96%   ??? PF 40L/min           Intake/Output Summary (Last 24 hours) at 05/15/09 0820  Last data filed at 05/15/09 4098   Gross per 24 hour   Intake 4540.46 ml   Output   1850 ml   Net 2690.46 ml        Telemetry Reviewed:     PHYSICAL EXAM:  General: Extubated, awake ,   HEENT: Pupils unequal, R 3mm, L 2mm  Lungs:  Bilateral rales, ronchi   Heart:  Regular rhythm, tachycardic, SEM  Abdomen: Soft, Non distended, hypoactive BS,RUQ pain,  no rebound, umbilical hernia non reducible,  Extremities:  No edema, pulses present, brace in left arm  Neurologic:?? GCS [id number], no gross deficit  Psych:???? AMS    Lab Data Reviewed: (see below)    Medications Reviewed: (see below)    PMH/SH reviewed - no change compared to H&P  ______________________________________________________________________        Care Plan discussed with:  Patient x   Family    RN x   Care Manager    Consultant/Specialist      >50% of visit spent in counseling and coordination of care       Prophylaxis:  GI PPI   DVT SCD     Disposition:   Home with Family    HH/PT/OT/RN    SNF/LTC x   [facility]      ______________________________________________________________________  Attending Physician: Ayesha Rumpf, MD   _____________________________________________________________________________________________________  Procedures: see electronic medical records for all procedures/Xrays and details  which were not copied into this note but were reviewed prior to creation of Plan.      LABS:  Recent Labs   Basename 05/15/09 0336 05/14/09 0600   ??? WBC 10.4 11.0   ??? HGB 7.9* 8.4*   ??? HCT 26.0* 27.4*   ??? PLT 92* 86*         Recent Labs    Basename 05/15/09 0336 05/14/09 0600 05/13/09 0130   ??? NA 134* 144 150*   ??? K 4.9 4.0 3.5   ??? CL 106 116* 119*   ??? CO2 18* 20* 21   ??? BUN 15 13 13    ??? CREA 0.8 0.7 0.7   ??? GLU 327* 118* 154*   ??? CA 9.1 9.0 9.1   ??? MG -- -- --   ??? PHOS -- -- --   ??? URICA -- -- --         Recent Labs   Basename 05/15/09 0336 05/14/09 0600 05/13/09 0130   ??? SGOT 26 28 26    ??? GPT 36 41 47   ??? AP 109 107 95   ??? TBIL 1.9* 2.1* 2.3*   ??? TP 5.9* 6.0* 5.7*   ??? ALB 1.9* 2.0* 1.9*   ??? GLOB 4.0 4.0 3.8   ??? GGT -- -- --   ??? AML -- -- --   ??? LPSE -- -- --           No results found for this basename: INR:3,PTP:3,APTT:3, in the last 72 hours     No results found for this basename: FE:2,TIBC:2,PSAT:2,FERR:2, in the last 72 hours     No results found for this basename: PH:2,PCO2:2,PO2:2, in the last 72 hours      No results found for this basename: CPK:3,CKMB:3,TROPONINI:3, in the last 72 hours    Lab Results   Component Value Date/Time    POC GLUCOSE 120 05/15/2009  6:51 AM    POC GLUCOSE 141 05/15/2009  1:16 AM    POC GLUCOSE 111 05/14/2009  6:49 PM    POC GLUCOSE 139 05/14/2009 11:24 AM    POC GLUCOSE 142 05/14/2009  7:38 AM         Lab Results   Component Value Date/Time    Color DARK YELLOW 05/09/2009 11:05 AM    Appearance CLEAR 05/09/2009 11:05 AM    Specific gravity 1.010 10/29/2008 11:00 AM    Specific gravity 1.024 05/09/2009 11:05 AM    pH 6.0 05/09/2009 11:05 AM    Protein TRACE 05/09/2009 11:05 AM    Glucose NEGATIVE  05/09/2009 11:05 AM  Ketone NEGATIVE  05/09/2009 11:05 AM    Bilirubin NEGATIVE  04/30/2009 12:14 AM    Urobilinogen 0.2 05/09/2009 11:05 AM    Nitrites NEGATIVE  05/09/2009 11:05 AM    Leukocyte Esterase SMALL 05/09/2009 11:05 AM    Epithelial cells 0-5 05/09/2009 11:05 AM    Bacteria NEGATIVE  05/09/2009 11:05 AM    WBC 5-10 05/09/2009 11:05 AM    RBC 10-20 05/09/2009 11:05 AM           MEDICATIONS:  Current facility-administered medications   Medication Dose Route Frequency   ??? aspirin chewable tablet 81 mg  81 mg Oral DAILY    ??? TPN ADULT - PERIPHERAL     IntraVENous CONTINUOUS   ??? metoprolol (LOPRESSOR) tablet 25 mg  25 mg Oral Q12H   ??? TPN ADULT - PERIPHERAL     IntraVENous CONTINUOUS   ??? DISCONTD: enoxaparin (LOVENOX) injection 40 mg  40 mg SubCUTAneous Q24H   ??? DISCONTD: vancomycin (VANCOCIN) 1 g in 0.9% sodium chloride 250 mL IVPB  1,000 mg IntraVENous Q12H   ??? dextrose 5% infusion    IntraVENous CONTINUOUS   ??? lactulose (CHRONULAC) solution 20 g  20 g Oral BID   ??? levothyroxine (SYNTHROID) injection 25 mcg  25 mcg IntraVENous DAILY   ??? DISCONTD: aspirin chewable tablet 81 mg  81 mg Oral DAILY   ??? DISCONTD: levofloxacin (LEVAQUIN) 750 mg infusion   750 mg IntraVENous Q24H   ??? piperacillin-tazobactam (ZOSYN) 3.375 g in 0.9% sodium chloride (MBP/ADV) 100 mL MBP  3.375 g IntraVENous Q6H   ??? albumin human 5% (BUMINATE) solution 25 g  25 g IntraVENous Q4H PRN   ??? pantoprazole (PROTONIX) injection 40 mg  40 mg IntraVENous DAILY   ??? albuterol/ipratropium (DUONEB) neb solution   1 Dose Nebulization Q4HWA RT   ??? sodium chloride (NS) flush 10 mL  10 mL InterCATHeter Q8H   ??? sodium chloride (NS) flush 10 mL  10 mL InterCATHeter PRN   ??? heparin (porcine) pf 300 Units  300 Units InterCATHeter PRN   ??? sodium chloride (NS) flush 20 mL  20 mL InterCATHeter PRN   ??? DISCONTD: morphine injection 1 mg  1 mg IntraVENous PRN   ??? DISCONTD: midazolam (VERSED) injection 2-4 mg  2-4 mg IntraVENous Q1H PRN   ??? acetaminophen (TYLENOL) suppository 650 mg  650 mg Rectal Q6H PRN   ??? albuterol (PROVENTIL VENTOLIN) nebulizer solution 2.5 mg  2.5 mg Nebulization Q2H PRN   ??? thiamine (B-1) 100 mg in 0.9% sodium chloride 50 mL IVPB  100 mg IntraVENous DAILY   ??? saline peripheral flush 5 mL  5 mL InterCATHeter PRN

## 2009-05-15 NOTE — Progress Notes (Signed)
On call physician notified regarding pt's refusal of NG tube insertion, no orders received will continue to monitor.

## 2009-05-15 NOTE — Progress Notes (Incomplete)
Pt schedule to receive 2 units of PRBC's.  However pt is unable to give consent.  Pt husband Breanna Lester was called to get a telephone consent.  Pt husband decline to give consent unless he spoke to the MD. Pt husband stated that pt had a reaction that cause related lung injury and placed her in a coma after a previous transfusion. Dr Ninfa Linden

## 2009-05-15 NOTE — Progress Notes (Signed)
Faculty or Preceptor Review of Student Work    05/15/2009  - Shift times - 0700 to1130    The student documentation of patient care for Breanna Lester has been reviewed and approved.  All medications have been administered under the direct supervision of the faculty or preceptor.

## 2009-05-15 NOTE — Progress Notes (Signed)
Bedside and Verbal shift change report given to oncoming nurse.

## 2009-05-15 NOTE — Progress Notes (Signed)
(  R) TLC PICC dressing change per protocol. Old dressing was C/D/I. Old dressing removed and site cleaned per aseptic technique. TLC flushed with positive blood return x 2. Red port needs heparin per declot algorithm. Primary nurse Marylene Land) will instill.  New Bio patch, Stat lock and Tegaderm applied. Micro clave caps changed x 3. Patient tolerated procedure well. Primary nurse aware of PICC drsg change. RIGHT PICC remains out at 4 cm as stated on 05/10/09 during readjustment.  Stasia Cavalier RN [facility] IV/PICC TEAM

## 2009-05-15 NOTE — Progress Notes (Signed)
Pt schedule to receive 2 units of PRBC's.  Pt unable to give consent.  Pt husband Breanna Lester call and was made aware of pt order and that a consent form was needed by him for her to receive a transfusion.  Pt husband decline to give that order unless he spoke with the MD.  Pt husband stated that the last transfusion that was given pt had a reaction that lead to a related lung injury and she was in a coma.  Dr Ninfa Linden paged and made aware of pt husband concerns.  Order was given to D/C order to transfuse 2 units of PRBC's.

## 2009-05-16 LAB — METABOLIC PANEL, COMPREHENSIVE
A-G Ratio: 0.5 — ABNORMAL LOW (ref 1.1–2.2)
ALT (SGPT): 35 U/L (ref 12–78)
AST (SGOT): 25 U/L (ref 15–37)
Albumin: 2.1 g/dL — ABNORMAL LOW (ref 3.5–5.0)
Alk. phosphatase: 116 U/L (ref 50–136)
Anion gap: 11 mmol/L (ref 5–15)
BUN/Creatinine ratio: 20 (ref 12–20)
BUN: 14 MG/DL (ref 6–20)
Bilirubin, total: 1.8 MG/DL — ABNORMAL HIGH (ref 0.2–1.0)
CO2: 21 MMOL/L (ref 21–32)
Calcium: 9.2 MG/DL (ref 8.5–10.1)
Chloride: 107 MMOL/L (ref 97–108)
Creatinine: 0.7 MG/DL (ref 0.6–1.3)
GFR est AA: >60 mL/min/{1.73_m2} (ref >60)
GFR est non-AA: >60 mL/min/{1.73_m2} (ref >60)
Globulin: 4.2 g/dL — ABNORMAL HIGH (ref 2.0–4.0)
Glucose: 115 MG/DL — ABNORMAL HIGH (ref 65–100)
Potassium: 3.9 MMOL/L (ref 3.5–5.1)
Protein, total: 6.3 g/dL — ABNORMAL LOW (ref 6.4–8.2)
Sodium: 139 MMOL/L (ref 136–145)

## 2009-05-16 LAB — GLUCOSE, POC
Glucose (POC): 109 mg/dL — ABNORMAL HIGH (ref 65–105)
Glucose (POC): 113 mg/dL — ABNORMAL HIGH (ref 65–105)
Glucose (POC): 114 mg/dL — ABNORMAL HIGH (ref 65–105)
Glucose (POC): 124 mg/dL — ABNORMAL HIGH (ref 65–105)

## 2009-05-16 LAB — CBC WITH AUTOMATED DIFF
ABS. BASOPHILS: 0 10*3/uL (ref 0.0–0.1)
ABS. EOSINOPHILS: 0.2 10*3/uL (ref 0.0–0.4)
ABS. LYMPHOCYTES: 1.4 10*3/uL (ref 0.8–3.5)
ABS. MONOCYTES: 0.6 10*3/uL (ref 0.0–1.0)
ABS. NEUTROPHILS: 6.3 10*3/uL (ref 1.8–8.0)
BASOPHILS: 0 % (ref 0–1)
EOSINOPHILS: 2 % (ref 0–7)
HCT: 27.4 % — ABNORMAL LOW (ref 35.0–47.0)
HGB: 8.4 g/dL — ABNORMAL LOW (ref 11.5–16.0)
LYMPHOCYTES: 16 % (ref 12–49)
MCH: 27.7 PG (ref 26.0–34.0)
MCHC: 30.7 g/dL (ref 30.0–36.5)
MCV: 90.4 FL (ref 80.0–99.0)
MONOCYTES: 7 % (ref 5–13)
NEUTROPHILS: 75 % (ref 32–75)
PLATELET: 92 10*3/uL — ABNORMAL LOW (ref 150–400)
RBC: 3.03 M/uL — ABNORMAL LOW (ref 3.80–5.20)
RDW: 22.7 % — ABNORMAL HIGH (ref 11.5–14.5)
WBC: 8.5 10*3/uL (ref 3.6–11.0)

## 2009-05-16 MED ADMIN — lactulose (CHRONULAC) solution 20 g: ORAL | @ 03:00:00 | NDC 00121457730

## 2009-05-16 MED ADMIN — albuterol/ipratropium (DUONEB) neb solution: RESPIRATORY_TRACT | @ 02:00:00 | NDC 00487980101

## 2009-05-16 MED ADMIN — sodium chloride (NS) flush 10 mL: @ 13:00:00 | NDC 87701099893

## 2009-05-16 MED ADMIN — levothyroxine (SYNTHROID) injection 25 mcg: INTRAVENOUS | @ 14:00:00 | NDC 63323024710

## 2009-05-16 MED ADMIN — heparin (porcine) pf 300 Units: @ 08:00:00

## 2009-05-16 MED ADMIN — rifaximin (XIFAXAN) tablet 400 mg: ORAL | @ 17:00:00 | NDC 65649030105

## 2009-05-16 MED ADMIN — metoprolol (LOPRESSOR) tablet 25 mg: ORAL | @ 03:00:00 | NDC 51079025501

## 2009-05-16 MED ADMIN — heparin (porcine) pf 300 Units: @ 13:00:00

## 2009-05-16 MED ADMIN — lactulose (CHRONULAC) solution 20 g: ORAL | @ 13:00:00 | NDC 00121457730

## 2009-05-16 MED ADMIN — sodium chloride (NS) flush 10 mL: @ 19:00:00 | NDC 87701099893

## 2009-05-16 MED ADMIN — albuterol/ipratropium (DUONEB) neb solution: RESPIRATORY_TRACT | @ 20:00:00 | NDC 00487980101

## 2009-05-16 MED ADMIN — piperacillin-tazobactam (ZOSYN) 3.375 g in 0.9% sodium chloride (MBP/ADV) 100 mL MBP: INTRAVENOUS | @ 15:00:00 | NDC 00338055318

## 2009-05-16 MED ADMIN — aspirin chewable tablet 81 mg: ORAL | @ 13:00:00

## 2009-05-16 MED ADMIN — thiamine (B-1) 100 mg in 0.9% sodium chloride 50 mL IVPB: INTRAVENOUS | @ 13:00:00 | NDC 63323001302

## 2009-05-16 MED ADMIN — piperacillin-tazobactam (ZOSYN) 3.375 g in 0.9% sodium chloride (MBP/ADV) 100 mL MBP: INTRAVENOUS | @ 05:00:00 | NDC 00338055318

## 2009-05-16 MED ADMIN — pantoprazole (PROTONIX) injection 40 mg: INTRAVENOUS | @ 13:00:00 | NDC 00409488810

## 2009-05-16 MED ADMIN — rifaximin (XIFAXAN) tablet 400 mg: ORAL | @ 20:00:00 | NDC 65649030105

## 2009-05-16 MED ADMIN — sodium chloride (NS) flush 10 mL: @ 10:00:00 | NDC 87701099893

## 2009-05-16 MED ADMIN — piperacillin-tazobactam (ZOSYN) 3.375 g in 0.9% sodium chloride (MBP/ADV) 100 mL MBP: INTRAVENOUS | @ 22:00:00 | NDC 00338055318

## 2009-05-16 MED ADMIN — piperacillin-tazobactam (ZOSYN) 3.375 g in 0.9% sodium chloride (MBP/ADV) 100 mL MBP: INTRAVENOUS | @ 10:00:00 | NDC 00338055318

## 2009-05-16 MED ADMIN — albuterol/ipratropium (DUONEB) neb solution: RESPIRATORY_TRACT | @ 12:00:00 | NDC 00487980101

## 2009-05-16 MED ADMIN — albuterol/ipratropium (DUONEB) neb solution: RESPIRATORY_TRACT | @ 15:00:00 | NDC 00487980101

## 2009-05-16 MED ADMIN — sodium chloride (NS) flush 10 mL: @ 03:00:00 | NDC 87701099893

## 2009-05-16 MED ADMIN — metoprolol (LOPRESSOR) tablet 25 mg: ORAL | @ 13:00:00 | NDC 51079025501

## 2009-05-16 MED FILL — SALINE FLUSH INJECTION SYRINGE: INTRAMUSCULAR | Qty: 10

## 2009-05-16 MED FILL — XIFAXAN 200 MG TABLET: 200 mg | ORAL | Qty: 2

## 2009-05-16 MED FILL — CATHFLO ACTIVASE 2 MG INTRA-CATHETER SOLUTION: 2 mg | Qty: 2

## 2009-05-16 MED FILL — ALBUTEROL SULFATE 0.083 % (0.83 MG/ML) SOLN FOR INHALATION: 2.5 mg /3 mL (0.083 %) | RESPIRATORY_TRACT | Qty: 1

## 2009-05-16 MED FILL — LEVOTHYROXINE 200 MCG IJ SOLR: 200 mcg | INTRAVENOUS | Qty: 5

## 2009-05-16 MED FILL — ZOSYN 3.375 GRAM INTRAVENOUS SOLUTION: 3.375 gram | INTRAVENOUS | Qty: 3.38

## 2009-05-16 MED FILL — THIAMINE 100 MG/ML INJECTION: 100 mg/mL | INTRAMUSCULAR | Qty: 1

## 2009-05-16 MED FILL — SODIUM CHLORIDE 0.9 % INJECTION: INTRAMUSCULAR | Qty: 10

## 2009-05-16 MED FILL — METOPROLOL TARTRATE 25 MG TAB: 25 mg | ORAL | Qty: 1

## 2009-05-16 MED FILL — ACEPHEN 650 MG RECTAL SUPPOSITORY: 650 mg | RECTAL | Qty: 1

## 2009-05-16 MED FILL — LACTULOSE 10 GRAM/15 ML ORAL SOLN: 10 gram/15 mL | ORAL | Qty: 30

## 2009-05-16 MED FILL — ALBUTEROL SULFATE 0.083 % (0.83 MG/ML) SOLN FOR INHALATION: 2.5 mg /3 mL (0.083 %) | RESPIRATORY_TRACT | Qty: 3

## 2009-05-16 MED FILL — ASPIRIN 81 MG CHEWABLE TAB: 81 mg | ORAL | Qty: 1

## 2009-05-16 MED FILL — HEPARIN LOCK FLUSH (PORCINE) (PF) 100 UNIT/ML INTRAVENOUS SYRINGE: 100 unit/mL | INTRAVENOUS | Qty: 10

## 2009-05-16 NOTE — Progress Notes (Signed)
 Formatting of this note might be different from the original.  Paged by RN at pt request for visit.  Pt had been requesting chaplain to talk to.  Pt stated her husband of 20 years is having an affair.  Offered ministry of listening presence and comfort.  Prayed with pt.  Follow up as requested/ needed.  Pt appeared to be somewhat confused.   SABRAj  Electronically signed by Chanda Nest L at 05/16/2009  4:10 AM EDT

## 2009-05-16 NOTE — Progress Notes (Signed)
 Formatting of this note is different from the original.       Hospitalist Progress Note         NAME: Breanna Lester        DOB:  1961-07-20        MRN:  [id number]      Assessment & Plan   Anemia, dark stools, diarrhea: getting better, H an H stable. Stool negative for CDT.    Sepsis syndrome :underlying pneumonia, fever, leukocytosis, resolved  Recheck CT chest. D/C vanco and LVQ. Continue Zosyn.  No evidence of UTI or ascites(SBP), has an umbilical hernia that is not     reducible but overall adbomen is soft, has diarrhea with dark stools    Acute respiratory failure:got extubated doing well off the ventilator    Altered mental status : multifactorial Sepsis, hypernatremia, ARF, hyperamonemia   has improved.    Hypernatremia: resolved, now off IVF.    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 Ischemic workup when stable    Rhabdomyolisis: resolved    Left wrist Fracture: casted Ortho following the case    Hypothyroidism: c/w  L-Thyroxine 25mcg IV.    Nutrition: Currently on TPN, More awake today. Will try liquid diet today     Subjective:     48yo caucasian female    Chief Complaint:  belly pain better , awake , follows commands    Discussed with RN events overnight.     Review of Systems:  Fever/chills N   Cough N   Sputum    N   SOB/DOE  N   Chest Pain N   Abdominal Pain N   Diarrhea Y   Constipation N   Nausea/Vomiting N   Dysuria N   Tolerating PT N    Tolerating Diet N   Could not obtain due to AMS vs intubation        Objective:     VITALS:   Last 24hrs VS reviewed since prior progress note. Most recent are:  Visit Vitals   Item Reading   ? BP 113/53   ? Pulse 66   ? Temp 98.4 F (36.9 C)   ? Resp 18   ? Ht 5' 2 (1.575 m)   ? Wt 144 lb 13.5 oz (65.7 kg)   ? SpO2 97%   ? PF 40L/min     Intake/Output Summary (Last 24 hours) at 05/16/09 1008  Last data filed at 05/16/09 9062   Gross per 24 hour   Intake 1880.65 ml   Output   2400 ml   Net -519.35 ml       Telemetry Reviewed:     PHYSICAL EXAM:  General: Extubated, awake ,   HEENT: Pupils unequal, R 3mm, L 2mm  Lungs:  Bilateral rales, ronchi   Heart:  Regular rhythm, tachycardic, SEM  Abdomen: Soft, Non distended, hypoactive BS,RUQ pain,  no rebound, umbilical hernia non reducible,  Extremities: No edema, pulses present, brace  in left arm  Neurologic: GCS [id number], no gross deficit  Psych: Alert and awake today    Lab Data Reviewed: (see below)    Medications Reviewed: (see below)    PMH/SH reviewed - no change compared to H&P  ______________________________________________________________________      Care Plan discussed with:  Patient x   Family    RN    Care Manager    Consultant/Specialist      >50% of visit spent in counseling and coordination of care       Prophylaxis:  GI PPI   DVT SCD     Disposition:   Home with Family    HH/PT/OT/RN    SNF/LTC x   [facility]      ______________________________________________________________________  Attending Physician: Kerney Meter, MD   _____________________________________________________________________________________________________  Procedures: see electronic medical records for all procedures/Xrays and details  which were not copied into this note but were reviewed prior to creation of Plan.      LABS:  Recent Labs   Basename 05/16/09 0415 05/15/09 0336   ? WBC 8.5 10.4   ? HGB 8.4* 7.9*   ? HCT 27.4* 26.0*   ? PLT 92* 92*     Recent Labs   Basename 05/16/09 0415  05/15/09 0336 05/14/09 0600   ? NA 139 134* 144   ? K 3.9 4.9 4.0   ? CL 107 106 116*   ? CO2 21 18* 20*   ? BUN 14 15 13    ? CREA 0.7 0.8 0.7   ? GLU 115* 327* 118*   ? CA 9.2 9.1 9.0   ? MG -- -- --   ? PHOS -- -- --   ? URICA -- -- --     Recent Labs   Basename 05/16/09 0415 05/15/09 0336 05/14/09 0600   ? SGOT 25 26 28    ? GPT 35 36 41   ? AP 116 109 107   ? TBIL 1.8* 1.9* 2.1*   ? TP 6.3* 5.9* 6.0*   ? ALB 2.1* 1.9* 2.0*   ? GLOB 4.2* 4.0 4.0   ? GGT -- -- --   ? AML -- -- --   ? LPSE -- -- --     No results found for this basename: INR:3,PTP:3,APTT:3, in the last 72 hours     No results found for this basename: FE:2,TIBC:2,PSAT:2,FERR:2, in the last 72 hours     No results found for this basename: PH:2,PCO2:2,PO2:2, in the last 72 hours    No results found for this basename: CPK:3,CKMB:3,TROPONINI:3, in the last 72 hours    Lab Results   Component Value Date/Time    POC GLUCOSE 109 05/16/2009  7:04 AM    POC GLUCOSE 114 05/15/2009 11:40 PM    POC GLUCOSE 113 05/15/2009  7:31 PM    POC GLUCOSE 120 05/15/2009  6:51 AM    POC GLUCOSE 141 05/15/2009  1:16 AM     Lab Results   Component Value Date/Time    Color DARK YELLOW 05/09/2009 11:05 AM    Appearance CLEAR 05/09/2009 11:05 AM    Specific gravity 1.010 10/29/2008 11:00 AM    Specific gravity 1.024 05/09/2009 11:05 AM    pH 6.0 05/09/2009 11:05 AM    Protein TRACE 05/09/2009 11:05 AM    Glucose NEGATIVE  05/09/2009 11:05 AM    Ketone NEGATIVE  05/09/2009 11:05 AM    Bilirubin NEGATIVE  04/30/2009 12:14 AM    Urobilinogen 0.2 05/09/2009  11:05 AM    Nitrites NEGATIVE  05/09/2009 11:05 AM    Leukocyte Esterase SMALL 05/09/2009 11:05 AM    Epithelial cells 0-5 05/09/2009 11:05 AM    Bacteria NEGATIVE  05/09/2009 11:05 AM    WBC 5-10 05/09/2009 11:05 AM    RBC 10-20 05/09/2009 11:05 AM     MEDICATIONS:  Current facility-administered medications   Medication Dose Route Frequency   ? TPN ADULT - PERIPHERAL     IntraVENous CONTINUOUS   ? aspirin chewable tablet 81 mg  81 mg Oral DAILY   ? TPN  ADULT - PERIPHERAL     IntraVENous CONTINUOUS   ? metoprolol  (LOPRESSOR ) tablet 25 mg  25 mg Oral Q12H   ? lactulose (CHRONULAC) solution 20 g  20 g Oral BID   ? levothyroxine (SYNTHROID) injection 25 mcg  25 mcg IntraVENous DAILY   ? piperacillin-tazobactam (ZOSYN) 3.375 g in 0.9% sodium chloride  (MBP/ADV) 100 mL MBP  3.375 g IntraVENous Q6H   ? albumin human 5% (BUMINATE) solution 25 g  25 g IntraVENous Q4H PRN   ? pantoprazole  (PROTONIX ) injection 40 mg  40 mg IntraVENous DAILY   ? albuterol /ipratropium (DUONEB ) neb solution   1 Dose Nebulization Q4HWA RT   ? sodium chloride  (NS) flush 10 mL  10 mL InterCATHeter Q8H   ? sodium chloride  (NS) flush 10 mL  10 mL InterCATHeter PRN   ? heparin (porcine) pf 300 Units  300 Units InterCATHeter PRN   ? sodium chloride  (NS) flush 20 mL  20 mL InterCATHeter PRN   ? acetaminophen  (TYLENOL ) suppository 650 mg  650 mg Rectal Q6H PRN   ? albuterol  (PROVENTIL  VENTOLIN ) nebulizer solution 2.5 mg  2.5 mg Nebulization Q2H PRN   ? thiamine (B-1) 100 mg in 0.9% sodium chloride  50 mL IVPB  100 mg IntraVENous DAILY   ? saline peripheral flush 5 mL  5 mL InterCATHeter PRN       Electronically signed by Dominique Cons, MD at 05/16/2009 10:22 AM EDT

## 2009-05-16 NOTE — Progress Notes (Signed)
 Formatting of this note might be different from the original.  Chaplin in the room talking to pt will continue to monitor.     Electronically signed by Jonetta Ivan, RN at 05/16/2009  4:56 AM EDT

## 2009-05-16 NOTE — Progress Notes (Signed)
 Formatting of this note might be different from the original.  Patient confused and seeing things and people that aren't there.  Easily reoriented.   Electronically signed by Everitt Avelina NOVAK, RN at 05/16/2009 11:34 PM EDT

## 2009-05-16 NOTE — Progress Notes (Signed)
 Formatting of this note might be different from the original.  Pt requested to talk to chaplin, the on call chaplin paged  will continue to monitor.  Electronically signed by Jonetta Ivan, RN at 05/16/2009  4:55 AM EDT

## 2009-05-16 NOTE — Consults (Signed)
 Associated Order(s): IP CONSULT TO GASTROENTEROLOGY  Formatting of this note is different from the original.  Gastroenterology Consult    Referring Physician: Dr. Dominique    Consult Date: 05/16/2009     Subjective:     Chief Complaint: Confusion, heme + stool.    History of Present Illness: Breanna Lester is a 48 y.o. female who is seen in consultation for anemia with heme + stool.  She is followed by Dr. 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  She has had a h/o EGD and Colonoscopy in 2008 per chart but no records available and no h/o in chart of previous variceal hemorrhage.  She is reluctant to receive blood transfusion since she had a severe transfusion reaction resulting in coma and we are asked to consult for heme + stool.  H/H increased today and no report of dark stool or BRBPR.  Pt asking to eat food, denies abdominal pain.    Past Medical History   Diagnosis Date   ? Liver disease 2009     liver failure   ? Depression    ? Anxiety      Past Surgical History   Procedure Date   ? Hx tonsillectomy 1972   ? Hx adenoidectomy 1972   ? Hx cesarean section 1986 and 1993   ? Hx hysterectomy 2008   ? Hx dilation and curettage 1999       Family History   Problem Relation Age of Onset   ? Cancer Mother    ? Heart Disease Father      History   Substance Use Topics   ? Tobacco Use: Quit     Quit date: 02/28/2008   ? Alcohol Use: No       Allergies   Allergen Reactions   ? Latex, Natural Rubber Rash     Current facility-administered medications   Medication Dose Route Frequency   ? TPN ADULT - PERIPHERAL     IntraVENous CONTINUOUS   ? aspirin chewable tablet 81 mg  81 mg Oral DAILY   ? TPN ADULT - PERIPHERAL     IntraVENous CONTINUOUS   ? metoprolol  (LOPRESSOR ) tablet 25 mg  25 mg Oral Q12H   ? lactulose (CHRONULAC) solution 20 g  20 g Oral BID   ? levothyroxine (SYNTHROID) injection 25 mcg  25 mcg IntraVENous DAILY    ? piperacillin-tazobactam (ZOSYN) 3.375 g in 0.9% sodium chloride  (MBP/ADV) 100 mL MBP  3.375 g IntraVENous Q6H   ? albumin human 5% (BUMINATE) solution 25 g  25 g IntraVENous Q4H PRN   ? pantoprazole  (PROTONIX ) injection 40 mg  40 mg IntraVENous DAILY   ? albuterol /ipratropium (DUONEB ) neb solution   1 Dose Nebulization Q4HWA RT   ? sodium chloride  (NS) flush 10 mL  10 mL InterCATHeter Q8H   ? sodium chloride  (NS) flush 10 mL  10 mL InterCATHeter PRN   ? heparin (porcine) pf 300 Units  300 Units InterCATHeter PRN   ? sodium chloride  (NS) flush 20 mL  20 mL InterCATHeter PRN   ? acetaminophen  (TYLENOL ) suppository 650 mg  650 mg Rectal Q6H PRN   ? albuterol  (PROVENTIL  VENTOLIN ) nebulizer solution 2.5 mg  2.5 mg Nebulization Q2H PRN   ? thiamine (B-1) 100 mg in 0.9% sodium chloride  50 mL IVPB  100 mg IntraVENous DAILY   ? saline peripheral flush 5 mL  5 mL InterCATHeter PRN  Review of Systems:  A detailed 10 organ review of systems is obtained with pertinent positives as listed in the History of Present Illness and Past Medical History. All others are negative.  Review of Systems - History obtained from chart review and the patient  General ROS: positive for  - malaise and sleep disturbance  Psychological ROS: positive for - concentration difficulties and disorientation  Respiratory ROS: positive for - shortness of breath  Cardiovascular ROS: negative for - chest pain or palpitations  Gastrointestinal ROS: negative for - abdominal pain, blood in stools, change in stools, hematemesis or melena  Genito-Urinary ROS: no dysuria, trouble voiding, or hematuria  Musculoskeletal ROS: negative  Neurological ROS: negative for - headaches  Dermatological ROS: negative    Objective:     Physical Exam:  BP 113/53  Pulse 66  Temp 98.4 F (36.9 C)  Resp 18  Ht 5' 2 (1.575 m)  Wt 144 lb 13.5 oz (65.7 kg)  SpO2 97%  PF 40 L/min   Gen: ill appearing, fatigued WF lying in bed, awake, alert and oriented to person,  place and time  Skin:  Extremities and face reveal no rashes. + palmer erythema. + telangiectasias on the chest wall.  HEENT: Sclerae anicteric. Extra-occular muscles are intact. No oral ulcers.   Neck: The neck is supple.  Cardiovascular: Regular rate and rhythm. No murmurs, gallops, or rubs.   Respiratory:   Clear breath sounds with no wheezes, rales, or rhonchi.  GI:  Abdomen obese, + umbilical hernia, mild shifting dullness, nontender  Rectal:  Heme + per chart  Musculoskeletal: trace LE edema  Neurological:  Gross memory appears intact.  Patient is alert and oriented.  Psychiatric:  Mood appears appropriate with judgement intact.  Lymphatic:  No cervical or supraclavicular adenopathy.    Laboratory:    Recent Results (from the past 24 hour(s))                                     C. DIFFICILE AG + TOXIN A/B    Collection Time    05/15/09 11:00 AM   Component Value Range   ? C. diff Ag + Toxin A/B NEGATIVE   NEGATIVE    GLUCOSE, POC    Collection Time    05/15/09  7:31 PM   Component Value Range   ? POC GLUCOSE 113 (*) 65 - 105 (mg/dL)   GLUCOSE, POC    Collection Time    05/15/09 11:40 PM   Component Value Range   ? POC GLUCOSE 114 (*) 65 - 105 (mg/dL)   CBC WITH AUTOMATED DIFF    Collection Time    05/16/09  4:15 AM   Component Value Range   ? WBC 8.5  3.6 - 11.0 (K/uL)   ? RBC 3.03 (*) 3.80 - 5.20 (M/uL)   ? HGB 8.4 (*) 11.5 - 16.0 (g/dL)   ? HCT 27.4 (*) 35.0 - 47.0 (%)   ? MCV 90.4  80.0 - 99.0 (FL)   ? MCH 27.7  26.0 - 34.0 (PG)   ? MCHC 30.7  30.0 - 36.5 (g/dL)   ? RDW 22.7 (*) 11.5 - 14.5 (%)   ? PLATELET 92 (*) 150 - 400 (K/uL)   ? NEUTROPHILS 75  32 - 75 (%)   ? LYMPHOCYTES 16  12 - 49 (%)   ? MONOCYTES 7  5 - 13 (%)   ?  EOSINOPHILS 2  0 - 7 (%)   ? BASOPHILS 0  0 - 1 (%)   ? ABSOLUTE NEUTS 6.3  1.8 - 8.0 (K/UL)   ? ABSOLUTE LYMPHS 1.4  0.8 - 3.5 (K/UL)   ? ABSOLUTE MONOS 0.6  0.0 - 1.0 (K/UL)   ? ABSOLUTE EOSINS 0.2  0.0 - 0.4 (K/UL)   ? ABSOLUTE BASOS 0.0  0.0 - 0.1 (K/UL)   ? DF SMEAR SCANNED     ? RBC  COMMENTS        Value: 1+ ANISOCYTOSIS      1+ HYPOCHROMIA   METABOLIC PANEL, COMPREHENSIVE    Collection Time    05/16/09  4:15 AM   Component Value Range   ? Sodium 139  136 - 145 (MMOL/L)   ? Potassium 3.9  3.5 - 5.1 (MMOL/L)   ? Chloride 107  97 - 108 (MMOL/L)   ? CO2 21  21 - 32 (MMOL/L)   ? Anion gap 11  5 - 15 (mmol/L)   ? Glucose 115 (*) 65 - 100 (MG/DL)   ? BUN 14  6 - 20 (MG/DL)   ? Creatinine 0.7  0.6 - 1.3 (MG/DL)   ? BUN/Creatinine ratio 20  12 - 20 ( )   ? GFR est AA >60  >60 (ml/min/1.84m2)   ? GFR est non-AA >60  >60 (ml/min/1.39m2)   ? Calcium 9.2  8.5 - 10.1 (MG/DL)   ? Bilirubin, total 1.8 (*) 0.2 - 1.0 (MG/DL)   ? ALT 35  12 - 78 (U/L)   ? AST 25  15 - 37 (U/L)   ? Alk. phosphatase 116  50 - 136 (U/L)   ? Protein, total 6.3 (*) 6.4 - 8.2 (g/dL)   ? Albumin 2.1 (*) 3.5 - 5.0 (g/dL)   ? Globulin 4.2 (*) 2.0 - 4.0 (g/dL)   ? A-G Ratio 0.5 (*) 1.1 - 2.2 ( )   GLUCOSE, POC    Collection Time    05/16/09  7:04 AM   Component Value Range   ? POC GLUCOSE 109 (*) 65 - 105 (mg/dL)     Assessment/Plan:     Nausea with vomiting (05/01/2009)    Hypokalemia (05/01/2009)    Hepatic encephalopathy (05/01/2009)    Gait abnormality (05/01/2009)    Alcoholic cirrhosis of liver (05/01/2009)    Hypomagnesemia (05/01/2009)      Plan:  Patient with known advanced cirrhosis with chronic multifactorial anemia found to have hemoccult + stool but no overt bleeding.  She could have some bleeding from UGI tract such as portal gastropathy which could cause chronic bleeding.  Also varices were not mentioned on chart and reasonable to repeat EGD this admission to check for UGI varices or PHG.    -> Protonix  qd  -> NPO after midnight for possible EGD, Dr. Conan to decide in am  -> Xifaxan for HE  -> Full liquids trial today      Electronically signed by Nonnie Ozell RAMAN, MD at 05/16/2009 11:49 AM EDT

## 2009-05-16 NOTE — Progress Notes (Signed)
 Formatting of this note might be different from the original.  Bedside and Verbal shift change report given to oncoming nurse.    Electronically signed by Waddell Slough D at 05/16/2009  3:25 PM EDT

## 2009-05-16 NOTE — Progress Notes (Signed)
 Formatting of this note might be different from the original.  Bedside and Verbal shift change report received from off going nurse.    Electronically signed by Waddell Slough D at 05/16/2009  7:30 AM EDT

## 2009-05-16 NOTE — Progress Notes (Signed)
Bedside and Verbal shift change report received from off going nurse.

## 2009-05-16 NOTE — Progress Notes (Signed)
Patient confused and seeing things and people that aren't there.  Easily reoriented.

## 2009-05-16 NOTE — Progress Notes (Signed)
Pt requested to talk to chaplin, the on call chaplin paged  will continue to monitor.

## 2009-05-16 NOTE — Consults (Signed)
Gastroenterology Consult     Referring Physician: Dr. Ninfa Linden    Consult Date: 05/16/2009     Subjective:     Chief Complaint: Confusion, heme + stool.    History of Present Illness: Breanna Lester is a 48 y.o. female who is seen in consultation for anemia with heme + stool.  She is followed by 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  She has had a h/o EGD and Colonoscopy in 2008 per chart but no records available and no h/o in chart of previous variceal hemorrhage.  She is reluctant to receive blood transfusion since she had a severe transfusion reaction resulting in coma and we are asked to consult for heme + stool.  H/H increased today and no report of dark stool or BRBPR.  Pt asking to eat food, denies abdominal pain.    Past Medical History   Diagnosis Date   ??? Liver disease 2009     liver failure   ??? Depression    ??? Anxiety        Past Surgical History   Procedure Date   ??? Hx tonsillectomy 1972   ??? Hx adenoidectomy 1972   ??? Hx cesarean section 1986 and 1993   ??? Hx hysterectomy 2008   ??? Hx dilation and curettage 1999        Family History   Problem Relation Age of Onset   ??? Cancer Mother    ??? Heart Disease Father        History   Substance Use Topics   ??? Tobacco Use: Quit     Quit date: 02/28/2008   ??? Alcohol Use: No        Allergies   Allergen Reactions   ??? Latex, Natural Rubber Rash       Current facility-administered medications   Medication Dose Route Frequency   ??? TPN ADULT - PERIPHERAL     IntraVENous CONTINUOUS   ??? aspirin chewable tablet 81 mg  81 mg Oral DAILY   ??? TPN ADULT - PERIPHERAL     IntraVENous CONTINUOUS   ??? metoprolol (LOPRESSOR) tablet 25 mg  25 mg Oral Q12H   ??? lactulose (CHRONULAC) solution 20 g  20 g Oral BID   ??? levothyroxine (SYNTHROID) injection 25 mcg  25 mcg IntraVENous DAILY    ??? piperacillin-tazobactam (ZOSYN) 3.375 g in 0.9% sodium chloride (MBP/ADV) 100 mL MBP  3.375 g IntraVENous Q6H   ??? albumin human 5% (BUMINATE) solution 25 g  25 g IntraVENous Q4H PRN   ??? pantoprazole (PROTONIX) injection 40 mg  40 mg IntraVENous DAILY   ??? albuterol/ipratropium (DUONEB) neb solution   1 Dose Nebulization Q4HWA RT   ??? sodium chloride (NS) flush 10 mL  10 mL InterCATHeter Q8H   ??? sodium chloride (NS) flush 10 mL  10 mL InterCATHeter PRN   ??? heparin (porcine) pf 300 Units  300 Units InterCATHeter PRN   ??? sodium chloride (NS) flush 20 mL  20 mL InterCATHeter PRN   ??? acetaminophen (TYLENOL) suppository 650 mg  650 mg Rectal Q6H PRN   ??? albuterol (PROVENTIL VENTOLIN) nebulizer solution 2.5 mg  2.5 mg Nebulization Q2H PRN   ??? thiamine (B-1) 100 mg in 0.9% sodium chloride 50 mL IVPB  100 mg IntraVENous DAILY   ??? saline peripheral flush 5 mL  5 mL InterCATHeter PRN  Review of Systems:  A detailed 10 organ review of systems is obtained with pertinent positives as listed in the History of Present Illness and Past Medical History. All others are negative.  Review of Systems - History obtained from chart review and the patient  General ROS: positive for  - malaise and sleep disturbance  Psychological ROS: positive for - concentration difficulties and disorientation  Respiratory ROS: positive for - shortness of breath  Cardiovascular ROS: negative for - chest pain or palpitations  Gastrointestinal ROS: negative for - abdominal pain, blood in stools, change in stools, hematemesis or melena  Genito-Urinary ROS: no dysuria, trouble voiding, or hematuria  Musculoskeletal ROS: negative  Neurological ROS: negative for - headaches  Dermatological ROS: negative    Objective:     Physical Exam:  BP 113/53   Pulse 66   Temp 98.4 ??F (36.9 ??C)   Resp 18   Ht 5\' 2"  (1.575 m)   Wt 144 lb 13.5 oz (65.7 kg)   SpO2 97%   PF 40 L/min    Gen: ill appearing, fatigued WF lying in bed, awake, alert and oriented to person, place and time  Skin:  Extremities and face reveal no rashes. + palmer erythema. + telangiectasias on the chest wall.  HEENT: Sclerae anicteric. Extra-occular muscles are intact. No oral ulcers.   Neck: The neck is supple.  Cardiovascular: Regular rate and rhythm. No murmurs, gallops, or rubs.   Respiratory:   Clear breath sounds with no wheezes, rales, or rhonchi.  GI:  Abdomen obese, + umbilical hernia, mild shifting dullness, nontender  Rectal:  Heme + per chart  Musculoskeletal: trace LE edema  Neurological:  Gross memory appears intact.  Patient is alert and oriented.  Psychiatric:  Mood appears appropriate with judgement intact.  Lymphatic:  No cervical or supraclavicular adenopathy.    Laboratory:    Recent Results (from the past 24 hour(s))                                                                                                 C. DIFFICILE AG + TOXIN A/B    Collection Time    05/15/09 11:00 AM   Component Value Range   ??? C. diff Ag + Toxin A/B NEGATIVE   NEGATIVE    GLUCOSE, POC    Collection Time    05/15/09  7:31 PM   Component Value Range   ??? POC GLUCOSE 113 (*) 65 - 105 (mg/dL)   GLUCOSE, POC    Collection Time    05/15/09 11:40 PM   Component Value Range   ??? POC GLUCOSE 114 (*) 65 - 105 (mg/dL)   CBC WITH AUTOMATED DIFF    Collection Time    05/16/09  4:15 AM   Component Value Range   ??? WBC 8.5  3.6 - 11.0 (K/uL)   ??? RBC 3.03 (*) 3.80 - 5.20 (M/uL)   ??? HGB 8.4 (*) 11.5 - 16.0 (g/dL)   ??? HCT 27.4 (*) 35.0 - 47.0 (%)   ??? MCV 90.4  80.0 - 99.0 (FL)   ??? MCH  27.7  26.0 - 34.0 (PG)   ??? MCHC 30.7  30.0 - 36.5 (g/dL)   ??? RDW 22.7 (*) 11.5 - 14.5 (%)   ??? PLATELET 92 (*) 150 - 400 (K/uL)   ??? NEUTROPHILS 75  32 - 75 (%)   ??? LYMPHOCYTES 16  12 - 49 (%)   ??? MONOCYTES 7  5 - 13 (%)   ??? EOSINOPHILS 2  0 - 7 (%)   ??? BASOPHILS 0  0 - 1 (%)   ??? ABSOLUTE NEUTS 6.3  1.8 - 8.0 (K/UL)   ??? ABSOLUTE LYMPHS 1.4  0.8 - 3.5 (K/UL)    ??? ABSOLUTE MONOS 0.6  0.0 - 1.0 (K/UL)   ??? ABSOLUTE EOSINS 0.2  0.0 - 0.4 (K/UL)   ??? ABSOLUTE BASOS 0.0  0.0 - 0.1 (K/UL)   ??? DF SMEAR SCANNED     ??? RBC COMMENTS        Value: 1+ ANISOCYTOSIS      1+ HYPOCHROMIA   METABOLIC PANEL, COMPREHENSIVE    Collection Time    05/16/09  4:15 AM   Component Value Range   ??? Sodium 139  136 - 145 (MMOL/L)   ??? Potassium 3.9  3.5 - 5.1 (MMOL/L)   ??? Chloride 107  97 - 108 (MMOL/L)   ??? CO2 21  21 - 32 (MMOL/L)   ??? Anion gap 11  5 - 15 (mmol/L)   ??? Glucose 115 (*) 65 - 100 (MG/DL)   ??? BUN 14  6 - 20 (MG/DL)   ??? Creatinine 0.7  0.6 - 1.3 (MG/DL)   ??? BUN/Creatinine ratio 20  12 - 20 ( )   ??? GFR est AA >60  >60 (ml/min/1.77m2)   ??? GFR est non-AA >60  >60 (ml/min/1.101m2)   ??? Calcium 9.2  8.5 - 10.1 (MG/DL)   ??? Bilirubin, total 1.8 (*) 0.2 - 1.0 (MG/DL)   ??? ALT 35  12 - 78 (U/L)   ??? AST 25  15 - 37 (U/L)   ??? Alk. phosphatase 116  50 - 136 (U/L)   ??? Protein, total 6.3 (*) 6.4 - 8.2 (g/dL)   ??? Albumin 2.1 (*) 3.5 - 5.0 (g/dL)   ??? Globulin 4.2 (*) 2.0 - 4.0 (g/dL)   ??? A-G Ratio 0.5 (*) 1.1 - 2.2 ( )   GLUCOSE, POC    Collection Time    05/16/09  7:04 AM   Component Value Range   ??? 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  She could have some bleeding from UGI tract such as portal gastropathy which could cause chronic bleeding.  Also varices were not mentioned on chart and reasonable to repeat EGD this admission to check for UGI varices or PHG.    -> Protonix qd  -> NPO after midnight for possible EGD, [name] to decide in am  -> Xifaxan for HE  -> Full liquids trial today

## 2009-05-16 NOTE — Progress Notes (Signed)
Bedside and Verbal shift change report given to oncoming nurse.

## 2009-05-16 NOTE — Progress Notes (Signed)
Paged by RN at pt request for visit.  Pt had been requesting chaplain to talk to.  Pt stated her husband of 20 years is having an affair.  Offered ministry of listening presence and comfort.  Prayed with pt.  Follow up as requested/ needed.  Pt appeared to be somewhat confused.   Marland Kitchenj

## 2009-05-16 NOTE — Progress Notes (Signed)
Chaplin in the room talking to pt will continue to monitor.

## 2009-05-16 NOTE — Progress Notes (Signed)
Hospitalist Progress Note         NAME: Breanna Lester        DOB:  04-May-1961        MRN:  [id number]      Assessment & Plan   Anemia, dark stools, diarrhea: getting better, H an H stable. Stool negative for CDT.    Sepsis syndrome :underlying pneumonia, fever, leukocytosis, resolved  Recheck CT chest. D/C vanco and LVQ. Continue Zosyn.  ????????????????????????No evidence of UTI or ascites(SBP), has an umbilical hernia that is not     reducible but overall adbomen is soft, has diarrhea with dark stools    Acute respiratory failure:got extubated doing well off the ventilator    Altered mental status : multifactorial Sepsis, hypernatremia, ARF, hyperamonemia   has improved.    Hypernatremia: resolved, now off IVF.    Pneumonia: on zosyn    Nausea with vomiting (05/01/2009) resolved, negative KUB and [place]    Hypokalemia (05/01/2009)  continue supplement KCL as needed  ????????????????????????Recheck labs    Hepatic encephalopathy (05/01/2009) on lactulose, GI input appreciated.    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 Ischemic workup when stable    Rhabdomyolisis: resolved    Left wrist Fracture: casted Ortho following the case    Hypothyroidism: c/w  L-Thyroxine IV.    Nutrition: Currently on TPN, More awake today. Will try liquid diet today       Subjective:     48yo caucasian female    Chief Complaint:  belly pain better , awake , follows commands    Discussed with RN events overnight.     Review of Systems:  Fever/chills N   Cough N   Sputum    N   SOB/DOE  N   Chest Pain N   Abdominal Pain N   Diarrhea Y   Constipation N   Nausea/Vomiting N   Dysuria N   Tolerating PT N   Tolerating Diet N    Could not obtain due to AMS vs intubation         Objective:       VITALS:   Last 24hrs VS reviewed since prior progress note. Most recent are:  Visit Vitals   Item Reading   ??? BP 113/53   ??? Pulse 66   ??? Temp 98.4 ??F (36.9 ??C)   ??? Resp 18   ??? Ht 5\' 2"  (1.575 m)   ??? Wt 144 lb 13.5 oz (65.7 kg)   ??? SpO2 97%   ??? PF 40L/min           Intake/Output Summary (Last 24 hours) at 05/16/09 1008  Last data filed at 05/16/09 4098   Gross per 24 hour   Intake 1880.65 ml   Output   2400 ml   Net -519.35 ml        Telemetry Reviewed:     PHYSICAL EXAM:  General: Extubated, awake ,   HEENT: Pupils unequal, R 3mm, L 2mm  Lungs:  Bilateral rales, ronchi   Heart:  Regular rhythm, tachycardic, SEM  Abdomen: Soft, Non distended, hypoactive BS,RUQ pain,  no rebound, umbilical hernia non reducible,  Extremities: No edema, pulses present, brace in left arm  Neurologic:?? GCS [id number], no gross deficit  Psych:???? Alert and awake today    Lab Data Reviewed: (see below)    Medications Reviewed: (see below)    PMH/SH reviewed - no change compared to H&P  ______________________________________________________________________        Care Plan discussed with:  Patient x   Family    RN    Care Manager    Consultant/Specialist      >50% of visit spent in counseling and coordination of care       Prophylaxis:  GI PPI   DVT SCD     Disposition:   Home with Family    HH/PT/OT/RN    SNF/LTC x   [facility]      ______________________________________________________________________  Attending Physician: Ayesha Rumpf, MD   _____________________________________________________________________________________________________  Procedures: see electronic medical records for all procedures/Xrays and details  which were not copied into this note but were reviewed prior to creation of Plan.      LABS:  Recent Labs   Basename 05/16/09 0415 05/15/09 0336   ??? WBC 8.5 10.4   ??? HGB 8.4* 7.9*   ??? HCT 27.4* 26.0*   ??? PLT 92* 92*         Recent Labs    Basename 05/16/09 0415 05/15/09 0336 05/14/09 0600   ??? NA 139 134* 144   ??? K 3.9 4.9 4.0   ??? CL 107 106 116*   ??? CO2 21 18* 20*   ??? BUN 14 15 13    ??? CREA 0.7 0.8 0.7   ??? GLU 115* 327* 118*   ??? CA 9.2 9.1 9.0   ??? MG -- -- --   ??? PHOS -- -- --   ??? URICA -- -- --         Recent Labs   Basename 05/16/09 0415 05/15/09 0336 05/14/09 0600   ??? SGOT 25 26 28    ??? GPT 35 36 41   ??? AP 116 109 107   ??? TBIL 1.8* 1.9* 2.1*   ??? TP 6.3* 5.9* 6.0*   ??? ALB 2.1* 1.9* 2.0*   ??? GLOB 4.2* 4.0 4.0   ??? GGT -- -- --   ??? AML -- -- --   ??? LPSE -- -- --           No results found for this basename: INR:3,PTP:3,APTT:3, in the last 72 hours     No results found for this basename: FE:2,TIBC:2,PSAT:2,FERR:2, in the last 72 hours     No results found for this basename: PH:2,PCO2:2,PO2:2, in the last 72 hours      No results found for this basename: CPK:3,CKMB:3,TROPONINI:3, in the last 72 hours    Lab Results   Component Value Date/Time    POC GLUCOSE 109 05/16/2009  7:04 AM    POC GLUCOSE 114 05/15/2009 11:40 PM    POC GLUCOSE 113 05/15/2009  7:31 PM    POC GLUCOSE 120 05/15/2009  6:51 AM    POC GLUCOSE 141 05/15/2009  1:16 AM         Lab Results   Component Value Date/Time    Color DARK YELLOW 05/09/2009 11:05 AM    Appearance CLEAR 05/09/2009 11:05 AM    Specific gravity 1.010 10/29/2008 11:00 AM    Specific gravity 1.024 05/09/2009 11:05 AM    pH 6.0 05/09/2009 11:05 AM    Protein TRACE 05/09/2009 11:05 AM    Glucose NEGATIVE  05/09/2009 11:05 AM    Ketone NEGATIVE  05/09/2009 11:05 AM    Bilirubin NEGATIVE  04/30/2009 12:14 AM    Urobilinogen 0.2 05/09/2009 11:05 AM    Nitrites NEGATIVE  05/09/2009 11:05 AM    Leukocyte Esterase SMALL 05/09/2009 11:05 AM    Epithelial cells 0-5 05/09/2009 11:05 AM    Bacteria NEGATIVE  05/09/2009 11:05 AM    WBC 5-10 05/09/2009 11:05 AM    RBC 10-20 05/09/2009 11:05 AM           MEDICATIONS:  Current facility-administered medications   Medication Dose Route Frequency   ??? TPN ADULT - PERIPHERAL     IntraVENous CONTINUOUS    ??? aspirin chewable tablet 81 mg  81 mg Oral DAILY   ??? TPN ADULT - PERIPHERAL     IntraVENous CONTINUOUS   ??? metoprolol (LOPRESSOR) tablet 25 mg  25 mg Oral Q12H   ??? lactulose (CHRONULAC) solution 20 g  20 g Oral BID   ??? levothyroxine (SYNTHROID) injection 25 mcg  25 mcg IntraVENous DAILY   ??? piperacillin-tazobactam (ZOSYN) 3.375 g in 0.9% sodium chloride (MBP/ADV) 100 mL MBP  3.375 g IntraVENous Q6H   ??? albumin human 5% (BUMINATE) solution 25 g  25 g IntraVENous Q4H PRN   ??? pantoprazole (PROTONIX) injection 40 mg  40 mg IntraVENous DAILY   ??? albuterol/ipratropium (DUONEB) neb solution   1 Dose Nebulization Q4HWA RT   ??? sodium chloride (NS) flush 10 mL  10 mL InterCATHeter Q8H   ??? sodium chloride (NS) flush 10 mL  10 mL InterCATHeter PRN   ??? heparin (porcine) pf 300 Units  300 Units InterCATHeter PRN   ??? sodium chloride (NS) flush 20 mL  20 mL InterCATHeter PRN   ??? acetaminophen (TYLENOL) suppository 650 mg  650 mg Rectal Q6H PRN   ??? albuterol (PROVENTIL VENTOLIN) nebulizer solution 2.5 mg  2.5 mg Nebulization Q2H PRN   ??? thiamine (B-1) 100 mg in 0.9% sodium chloride 50 mL IVPB  100 mg IntraVENous DAILY   ??? saline peripheral flush 5 mL  5 mL InterCATHeter PRN

## 2009-05-17 LAB — CBC WITH AUTOMATED DIFF
ABS. BASOPHILS: 0.1 10*3/uL (ref 0.0–0.1)
ABS. EOSINOPHILS: 0.2 10*3/uL (ref 0.0–0.4)
ABS. LYMPHOCYTES: 1.7 10*3/uL (ref 0.8–3.5)
ABS. MONOCYTES: 0.5 10*3/uL (ref 0.0–1.0)
ABS. NEUTROPHILS: 5.4 10*3/uL (ref 1.8–8.0)
BASOPHILS: 1 % (ref 0–1)
EOSINOPHILS: 2 % (ref 0–7)
HCT: 25.7 % — ABNORMAL LOW (ref 35.0–47.0)
HGB: 8 g/dL — ABNORMAL LOW (ref 11.5–16.0)
LYMPHOCYTES: 22 % (ref 12–49)
MCH: 28 PG (ref 26.0–34.0)
MCHC: 31.1 g/dL (ref 30.0–36.5)
MCV: 89.9 FL (ref 80.0–99.0)
MONOCYTES: 6 % (ref 5–13)
NEUTROPHILS: 69 % (ref 32–75)
PLATELET: 98 10*3/uL — ABNORMAL LOW (ref 150–400)
RBC: 2.86 M/uL — ABNORMAL LOW (ref 3.80–5.20)
RDW: 22.6 % — ABNORMAL HIGH (ref 11.5–14.5)
WBC: 7.9 10*3/uL (ref 3.6–11.0)

## 2009-05-17 LAB — METABOLIC PANEL, COMPREHENSIVE
A-G Ratio: 0.5 — ABNORMAL LOW (ref 1.1–2.2)
ALT (SGPT): 32 U/L (ref 12–78)
AST (SGOT): 29 U/L (ref 15–37)
Albumin: 2 g/dL — ABNORMAL LOW (ref 3.5–5.0)
Alk. phosphatase: 111 U/L (ref 50–136)
Anion gap: 6 mmol/L (ref 5–15)
BUN/Creatinine ratio: 17 (ref 12–20)
BUN: 12 MG/DL (ref 6–20)
Bilirubin, total: 1.6 MG/DL — ABNORMAL HIGH (ref 0.2–1.0)
CO2: 24 MMOL/L (ref 21–32)
Calcium: 9 MG/DL (ref 8.5–10.1)
Chloride: 111 MMOL/L — ABNORMAL HIGH (ref 97–108)
Creatinine: 0.7 MG/DL (ref 0.6–1.3)
GFR est AA: >60 mL/min/{1.73_m2} (ref >60)
GFR est non-AA: >60 mL/min/{1.73_m2} (ref >60)
Globulin: 4.2 g/dL — ABNORMAL HIGH (ref 2.0–4.0)
Glucose: 86 MG/DL (ref 65–100)
Potassium: 3.8 MMOL/L (ref 3.5–5.1)
Protein, total: 6.2 g/dL — ABNORMAL LOW (ref 6.4–8.2)
Sodium: 141 MMOL/L (ref 136–145)

## 2009-05-17 LAB — GLUCOSE, POC
Glucose (POC): 79 mg/dL (ref 65–105)
Glucose (POC): 83 mg/dL (ref 65–105)
Glucose (POC): 87 mg/dL (ref 65–105)
Glucose (POC): 89 mg/dL (ref 65–105)

## 2009-05-17 LAB — PTT: aPTT: 29.2 s (ref 24.0–33.0)

## 2009-05-17 LAB — PROTHROMBIN TIME + INR
INR: 1.4 — ABNORMAL HIGH (ref 0.9–1.1)
Prothrombin time: 14.4 SECS — ABNORMAL HIGH (ref 9.0–11.0)

## 2009-05-17 MED ADMIN — sodium chloride (NS) flush 10 mL: @ 18:00:00 | NDC 87701099893

## 2009-05-17 MED ADMIN — sodium chloride (NS) flush 10 mL: @ 02:00:00 | NDC 87701099893

## 2009-05-17 MED ADMIN — piperacillin-tazobactam (ZOSYN) 3.375 g in 0.9% sodium chloride (MBP/ADV) 100 mL MBP: INTRAVENOUS | @ 11:00:00 | NDC 00338055318

## 2009-05-17 MED ADMIN — albuterol/ipratropium (DUONEB) neb solution: RESPIRATORY_TRACT | @ 20:00:00 | NDC 00487980101

## 2009-05-17 MED ADMIN — pantoprazole (PROTONIX) injection 40 mg: INTRAVENOUS | @ 14:00:00 | NDC 00409488810

## 2009-05-17 MED ADMIN — piperacillin-tazobactam (ZOSYN) 3.375 g in 0.9% sodium chloride (MBP/ADV) 100 mL MBP: INTRAVENOUS | @ 22:00:00 | NDC 00338055318

## 2009-05-17 MED ADMIN — albuterol/ipratropium (DUONEB) neb solution: RESPIRATORY_TRACT | @ 02:00:00 | NDC 00487980101

## 2009-05-17 MED ADMIN — piperacillin-tazobactam (ZOSYN) 3.375 g in 0.9% sodium chloride (MBP/ADV) 100 mL MBP: INTRAVENOUS | @ 20:00:00 | NDC 00338055318

## 2009-05-17 MED ADMIN — TPN ADULT - PERIPHERAL: INTRAVENOUS | NDC 00517293025

## 2009-05-17 MED ADMIN — rifaximin (XIFAXAN) tablet 400 mg: ORAL | @ 02:00:00 | NDC 65649030105

## 2009-05-17 MED ADMIN — levothyroxine (SYNTHROID) injection 25 mcg: INTRAVENOUS | @ 14:00:00 | NDC 63323024710

## 2009-05-17 MED ADMIN — midazolam (VERSED) injection 0.5-10 mg: INTRAVENOUS | @ 17:00:00 | NDC 10019002837

## 2009-05-17 MED ADMIN — thiamine (B-1) 100 mg in 0.9% sodium chloride 50 mL IVPB: INTRAVENOUS | @ 18:00:00 | NDC 63323001302

## 2009-05-17 MED ADMIN — piperacillin-tazobactam (ZOSYN) 3.375 g in 0.9% sodium chloride (MBP/ADV) 100 mL MBP: INTRAVENOUS | @ 03:00:00 | NDC 00338055318

## 2009-05-17 MED ADMIN — dextrose 5 % - 0.9% NaCl infusion: INTRAVENOUS | @ 16:00:00 | NDC 00409794109

## 2009-05-17 MED ADMIN — albuterol/ipratropium (DUONEB) neb solution: RESPIRATORY_TRACT | @ 12:00:00 | NDC 00487980101

## 2009-05-17 MED ADMIN — albuterol/ipratropium (DUONEB) neb solution: RESPIRATORY_TRACT | @ 15:00:00 | NDC 00487980101

## 2009-05-17 MED ADMIN — benzocaine (HURRICANE) 20 % spray: @ 17:00:00 | NDC 72934540402

## 2009-05-17 MED ADMIN — meperidine (DEMEROL) injection 12.5-50 mg: INTRAVENOUS | @ 17:00:00 | NDC 00409117830

## 2009-05-17 MED ADMIN — rifaximin (XIFAXAN) tablet 400 mg: ORAL | @ 22:00:00 | NDC 65649030105

## 2009-05-17 MED ADMIN — sodium chloride (NS) flush 10 mL: @ 11:00:00 | NDC 87701099893

## 2009-05-17 MED ADMIN — metoprolol (LOPRESSOR) tablet 25 mg: ORAL | @ 02:00:00 | NDC 51079025501

## 2009-05-17 MED ADMIN — lactulose (CHRONULAC) solution 20 g: ORAL | @ 02:00:00 | NDC 00121457730

## 2009-05-17 MED FILL — XIFAXAN 200 MG TABLET: 200 mg | ORAL | Qty: 2

## 2009-05-17 MED FILL — MIDAZOLAM 1 MG/ML IJ SOLN: 1 mg/mL | INTRAMUSCULAR | Qty: 10

## 2009-05-17 MED FILL — ZOSYN 3.375 GRAM INTRAVENOUS SOLUTION: 3.375 gram | INTRAVENOUS | Qty: 3.38

## 2009-05-17 MED FILL — THIAMINE 100 MG/ML INJECTION: 100 mg/mL | INTRAMUSCULAR | Qty: 1

## 2009-05-17 MED FILL — MEPERIDINE (PF) 50 MG/ML IJ SOLN: 50 mg/mL | INTRAMUSCULAR | Qty: 1

## 2009-05-17 MED FILL — ALBUTEROL SULFATE 0.083 % (0.83 MG/ML) SOLN FOR INHALATION: 2.5 mg /3 mL (0.083 %) | RESPIRATORY_TRACT | Qty: 1

## 2009-05-17 MED FILL — SALINE FLUSH INJECTION SYRINGE: INTRAMUSCULAR | Qty: 10

## 2009-05-17 MED FILL — DEXTROSE 5% IN NORMAL SALINE IV: INTRAVENOUS | Qty: 1000

## 2009-05-17 MED FILL — AMINOSYN II 10 % IV: 10 % | INTRAVENOUS | Qty: 685.1

## 2009-05-17 MED FILL — SALINE FLUSH INJECTION SYRINGE: INTRAMUSCULAR | Qty: 30

## 2009-05-17 MED FILL — HURRICAINE 20 % MUCOSAL SPRAY: 20 % | Qty: 59.2

## 2009-05-17 MED FILL — LEVOTHYROXINE 200 MCG IJ SOLR: 200 mcg | INTRAVENOUS | Qty: 5

## 2009-05-17 MED FILL — METOPROLOL TARTRATE 25 MG TAB: 25 mg | ORAL | Qty: 1

## 2009-05-17 MED FILL — SODIUM CHLORIDE 0.9 % INJECTION: INTRAMUSCULAR | Qty: 10

## 2009-05-17 MED FILL — LACTULOSE 10 GRAM/15 ML ORAL SOLN: 10 gram/15 mL | ORAL | Qty: 30

## 2009-05-17 NOTE — Progress Notes (Signed)
 Formatting of this note is different from the original.  Problem: Self Care Deficits Care Plan (Adult)  Goal: *Acute Goals and Plan of Care (Insert Text)  Occupational Therapy Goals Initiated 05/11/09    1. Pt will complete grooming in supported position with minimal assistance within 7 days.  2. Pt will increase fine motor skills to open task containers (toothpaste, lotion, ect) with set up assist within 7 days.  3. When appropriate, transfer training in preparation for functional mobility with be initiated and appropriate goal will be set.  4. Pt will tolerated x 10 min edge of bed with minimal assistance while completing dynamic balance activity within 7 days.  5. Pt will move from supine to sit with minimal assistance within 7 days, in preparation for functional mobility.   OCCUPATIONAL THERAPY TREATMENT    NAME: Breanna Lester AGE: 48 y.o.  GENDER: female  DATE: 05/17/2009  PRIMARY DIAGNOSIS: AMS, hypokalemia, lft upper extremity pain  AMS, hypokalemia, lft upper extremity pain  AMS, hypokalemia, lft upper extremity pain  Colonoscopy  <principal problem not specified>  Procedure(s) (LRB):  COLONOSCOPY (N/A)        Precautions: fall, contact    Chart, occupational therapy assessment, plan of care, and goals were reviewed.    SUBJECTIVE:   Patient stated ? I can't believe how weak I feel .?    OBJECTIVE DATA SUMMARY:   Cognitive/Behavioral Status:  Level of Consciousness: Alert;Confused  Orientation Level: Oriented to person;Oriented to place  Cognition: Decreased attention/concentration;Follows commands;Recognition of people/places  Safety/Judgement: Not assessed  Functional Mobility and Transfers for ADLs:              Bed Mobility:  Assisted PTA with bed mobility  Rolling: Additional time;Minimal  assistance  Supine to Sit: Additional time;Minimal assistance  Scooting: Additional time;Minimum assistance              Transfers:              Sit to Stand: Assist X2;Moderate assistance (multiple sit-stand transfers  to assist with cleaning patien t )            Bed to Chair: Maximum assistance;Assist X2 ((stand pivot))              Balance:  Sitting: Impaired  Sitting - Static: Poor (constant support);Fair (occasional) ((poor progressing to fair))  Sitting - Dynamic:  (poor progressing to fair-patient min assist for dynamic bala nce  )  Standing: Impaired  Standing - Static: Constant support;Poor  Standing - Dynamic : Impaired  ADL Intervention:  Assisted PTA with sit-stand transfers.  Grooming  Grooming Assistance: Moderate assistance  Brushing/Combing Hair:  (requires  assist/ support at elbows to raise right arm high enough to  comb hair )  Seated edge of bed with minimum assistance for dynamic balance    Upper Body Dressing Assistance  Dressing Assistance: Moderate assistance (secondary to decreased bilateral strength in UE, wrist cast,  and IV site )  with minimum assistance for balance seated edge of bed    Lower Body Dressing Assistance  Socks: Compensatory technique training (educated patient with hemi technique. A  ble to cross legs, bu t unable to don sock secondary to fatigue (patient had been sitting EOB for 6 minutes), balance, and difficulty with technique. )  Leg Crossed Method Used: Yes  Position Performed: Seated edge of bed (CGA-min assist for maintenance of balance)  Cues: Donn;Visual cues provided;Physical assistance    Had patient practice manipulating and opening different  containers to increase FM coordination and hand strength needed for performance of ADLs. Patient able to open 1/2 containers with difficulty.    Cognitive Retraining  Safety/Judgement: Not assessed     Therapeutic Exercises: Performed the following exercises seated in chair. Educated patient with HEP for UEs and its benefits. Encouraged patient to perform throughout the day to increase ROM and strength needed for performance of functional transfers and ADLs. Patient verbalizes understanding.    EXERCISE   Sets   Reps   Active Active  Assist   Passive   Comments   Shoulder shrugs 1 10 [X]  [ ]  [ ]      Elbow flexion/extension 1 10 [X]  [ ]  [ ]  Decreased ROM on L UE secondary to pain   Forearm Pronation/Supination 1 5 [X]  [ ]  [ ]  Decreased ROM on L UE secondary to pain   Finger Oppostion 1 10 [X]  [ ]  [ ]  Unable to fully oppose on L hand.     Shoulder flexion/extension 1 10 [ ]  [X]  [ ]          Also encouraged patient to move/wiggle fingers of L hand throughout the day to decrease stiffness and edema. Patient c/o pain and stiff feeling in fingers.  Pain:  Pain Scale: Verbal  Pain Intensity: patient states some pain, unable to rate.  Pain Location: Arm    Activity Tolerance: Vitals stable throughout treatment    After treatment:  [X]     Patient left in NAD  [X]     Call bell left within reach  [ ]     Caregiver present  [X]     Patient left up in chair, nursing staff notified    ASSESSMENT AND PROGRESSION TOWARD GOALS:   Patient pleasant and agreeable to therapy. Appeared somewhat confused, but easily reoriented and able to follow commands. Patient presents with decreased FM coordination, balance, activity tolerance, BUE strength and ROM, increased pain, and GW. Patient will continue to benefit from therapy to address deficits. At this time, patient is appropriate for SNF rehab, but as she progresses may become appropriate for inpatient rehabilitation to address deficits.    Patient?s progression toward goals is as follows:  [ ]     Improving appropriately and progressing toward goals  [X]     Improving slowly and progressing toward goals  [ ]     Not making progress toward goals and plan of care will be adjusted    PLAN OF CARE:   Patient continues to benefit from skilled intervention to address the above impairments.  Continue treatment per established plan of care.  Planned Interventions:  [X]     Self Care Training                   [X]     Therapeutic Activities  [X]     Functional Mobility Training    [X]     Cognitive Retraining  [X]     Therapeutic  Exercises             [X]     Endurance Activities  [X]     Balance Training                      [ ]     Neuromuscular Re-education  [X]     Patient Education                     [ ]     Family Training/Education  [ ]     Visual/Perceptual Training        [ ]   Home safety training  [ ]     Other (Comment):  Discharge Recommendations:  [ ]     To be determined                                  [ ]     Day rehabilitation program  [ ]     Home without services  (home safety education provided)  [ ]     Home with Home Health services  (home safety education provided)  [ ]    Outpatient therapy                                   [X]     Nursing facility for rehab  [X]     Inpatient Rehab Hospital  vs.              [ ]     Nursing facility for long term care  [ ]     Other:  Further Equipment Recommendations for Discharge: tbd  Communication/Collaboration:  The patient?s plan of care was discussed with:  Physical Therapy Assistant, Registered Nurse    Vernell BRAVO. [place], OTA -Applicant  Time Calculation: 38  mins          Electronically signed by Milissa Wanda DEL, OTR/L at 05/17/2009 12:53 PM EDT

## 2009-05-17 NOTE — Progress Notes (Signed)
 Formatting of this note is different from the original.  Problem: Dysphagia (Adult)  Goal: *Acute Goals and Plan of Care (Insert Text)  Speech Therapy Goals  Initiated 05/17/09  1. Patient will tolerate mechanical soft with thin liquids without overt s/s of aspiration within 7 days  2. Patient will tolerate trials of solids without overt s/s of aspiration within 7 days  3. Patient will participate in Integrated Language evaluation within 7 days    Initiated 05/10/09; re-evaluated 05/17/09  1. Patient will participate in re-evaluation of swallow within 7 days MET  2. Patient will tolerate least restrictive diet without signs or symptoms of aspiration within 7 days NOT MET  3. Patient will participate in MBS as needed/appropriate within 7 days NOT MET; not needed  SPEECH LANGUAGE PATHOLOGY DYSPHAGIA TREATMENT: WEEKLY REASSESSMENT    NAME : Breanna Lester AGE: 48 y.o.  GENDER: female  DATE: 05/17/2009  PRIMARY DIAGNOSIS: AMS, hypokalemia, lft upper extremity pain  AMS, hypokalemia, lft upper extremity pain  AMS, hypokalemia, lft upper extremity pain  Colonoscopy  <principal problem not specified>  Procedure(s) (LRB):  ESOPHAGOGASTRODUODENOSCOPY (EGD) (N/A)  ESOPHAGOGASTRODUODENAL (EGD) BIOPSY (N/A)  Day of Surgery     SUBJECTIVE:   Patient stated ?  That applesauce is so good! NAD noted. Patient laughing inappropriately. Poor attention.    OBJECTIVE:   Cognitive and Communication Status:  Level of Consciousness: Alert  Orientation Level: Oriented to person;Oriented to place  Cognition: Decreased attention/concentration;Impaired decision making;Poor safety awareness  Perception: Appears intact  Perseveration: No perseveration noted  Safety/Judgement: Decreased awareness of need for assistance;Decreased awareness of need for safety    Dysphagia Treatment:  Oral Assessment:  Oral Assessment  Labial: No impairment  Dentition: Extractions;Intact  Oral Hygiene: Xerostomic  Lingual: No impairment  Velum: No impairment    P.O.  Trials:  Patient Position: Upright in bed    The patient was given the following:  Consistency Presented: Puree;Solid;Thin liquid  How Presented: Self-fed/presented;SLP-fed/presented;Cup/sip;Spoon;Successive swallows;Straw    ORAL PHASE:  Bolus Acceptance: No impairment  Bolus Formation/Control: No impairment  Propulsion: No impairment  Oral Residue: Lingual  with cracker    PHARYNGEAL PHASE:  Initiation of Swallow: No impairment  Laryngeal Elevation: Functional  Aspiration Signs/Symptoms: Clear throat  x2 with ice chips. Expelled greenish colored mucous afterwards.  Vocal Quality: No impairment  Cues for Modifications: Minimal-moderate  Effective Modifications: Small sips and bites  After treatment:  [X]    Patient left in NAD  [X]    Call bell left within reach  [ ]    Caregiver present  [ ]    Patient left up in chair, nursing staff notified    ASSESSMENT AND PROGRESSION TOWARD GOALS:   Patient presents with no oropharyngeal dysphagia. Patient's cognitive status still places patient at risk for aspiration pneumonia. Patient required consistent verbal cues to not talk while chewing crackers and to take small sips via straw. Cough x2 with expulsion of green colored mucous after ice chips. Appropriate for upgrade to regular diet if she tolerates mechanical soft diet well tonight/tomorrow.    Patient?s progression toward goals since last assessment: Excellent progress. Much more awake/alert and clearer cognitively. Suspect cognitive deficits persist; patient need int. language evaluation.    PLAN OF CARE:   Goals have been updated based on progression since last assessment.    Patient continues to benefit from skilled intervention to address the above impairments.    Continue to follow patient 4 times a week  to address goals.    Recommendations  and Planned Interventions:  SLP treatment    Any new recommendations or planned interventions including recommended diet changes were discussed with:  [X]   Patient   [ ]    Family       [X]    Nursing  [X]    Provider  [X]   Posted safety precautions in patient?s room    Communication/Collaboration:  The patient?s plan of care was discussed with:  Registered Nurse    Eleanor DELENA Dunker, SLP    Time Calculation: 15 mins          Electronically signed by Dunker Eleanor DELENA, SLP at 05/17/2009  3:02 PM EDT

## 2009-05-17 NOTE — Progress Notes (Signed)
 Formatting of this note might be different from the original.  Responded to staff request to visit patient, who was requesting to talk to a chaplain.  Provided supportive presence and empathetic listening as patient tearfully verbalized emotions.  Patient described feelings of being overwhelmed in dealing with conflict in her family and her medical diagnoses.  Offered spiritual support as patient raised theological questions.  Offered scriptural assurances, prayer, and guided imagery.  Patient seemed more relaxed, and stated that she thought she could get some sleep.  Pastoral care will continue to support patient.      Visit by Truman Cuna, MA, Chaplain   Electronically signed by Cuna Truman A at 05/17/2009 11:05 PM EDT

## 2009-05-17 NOTE — Progress Notes (Signed)
 Formatting of this note is different from the original.  Nutrition Recommendations:   Liberalize diet to 2g Na continue Mech Soft (discussed with MD, order obtained)   Titrate PPN to d/c once pt consuming >50% of her trays.    Progress Note:  Pt is back on PPN tonight, will provide 771kcal, 64g Pro (did not have PPN 9/26).  Pt's NGT out (pt refused replacement on 9/24).      Diet advanced by SLP today to Mech Soft.  Pt with 75% intake of 1 Full Liquid tray over weekend.    EGD found no source of anemia.  Lactulose d/c'd.  Continues on thiamin.  C. Diff negative 05/15/09.    ______________________________________________________  POC:  Lab Results   Component Value Date/Time    POC GLUCOSE 83 05/17/2009  7:31 AM    POC GLUCOSE 87 05/17/2009  7:29 AM    POC GLUCOSE 79 05/17/2009  7:27 AM    POC GLUCOSE 124 05/16/2009 11:54 AM     Pt's BMI:  Estimated Body mass index is 26.49 kg/(m^2) as calculated from the following:    Height as of this encounter: 5' 2(1.575 m).    Weight as of this encounter: 144 lb 13.5 oz(65.7 kg).  Based on the BMI above, pt is overweight    Weight History: Wt Readings from Last 20 Encounters:   05/05/2009 144 lb 13.5 oz (65.7 kg)   05/05/2009 144 lb 13.5 oz (65.7 kg)   11/06/2008 154 lb (69.854 kg)   10/28/2008 149 lb 12.8 oz (67.949 kg)     ______________________________________________________  Thank you  Morna Solomons, RD  Pager [phone]    Electronically signed by Solomons Morna BROCKS, RD at 05/17/2009  3:26 PM EDT

## 2009-05-17 NOTE — Progress Notes (Signed)
 Formatting of this note might be different from the original.  Spoke with Dr Nonnie in reference to pt receiving medications due to NPO status.  MD stated to hold am meds until after pt has her scope done. Will f/u  Electronically signed by Waddell Slough D at 05/17/2009  8:41 AM EDT

## 2009-05-17 NOTE — Progress Notes (Signed)
 Formatting of this note might be different from the original.  Chart reviewed. Spoke with RN. Patient to have EGD today around noon. NPO for EGD. Patient was on full liquid diet; RN reports s/s of aspiration. Per last SLP visit on Friday, 05/14/09, recommending NPO given overt s/s of aspiration along with increased RR with PO. SLP to follow up this PM as able and patient appropriate. Thanks.   Electronically signed by Oval Eleanor LABOR, SLP at 05/17/2009 10:05 AM EDT

## 2009-05-17 NOTE — Progress Notes (Signed)
 Formatting of this note might be different from the original.  Pt foley discontinued as ordered.    Electronically signed by Waddell Slough D at 05/17/2009  3:04 PM EDT

## 2009-05-17 NOTE — Procedures (Signed)
Indications:  See above    Medications:  meperidine 12.5 mg IV    midazolam 1.5 mg IV    Description of Procedure:    Prior to the procedure its objectives, risks, consequences and alternatives were discussed with the patient who then elected to proceed.  The Olympus video endoscope was inserted under direct vision into the mouth and then into the esophagus.  The esohagus looked normal to the squamo-columnar junction.  There were no esophageal varices.  There was a non-obstructing Schatzki ring at 40 cm and an irregular z line.  I took biopsies of the distal esophagus and mid esophagus.  There was some oozing that stopped on its own.  There were no diagnostic abnormalities of the body, fundus, antrum, cardia and incisura of the stomach.  This included direct and retroflexion examination.  The first and second portion of the duodenum appeared normal.      I took biopsies of the stomach and of the duodenum.      Complications: There were no apparent complications and the patient tolerated the procedure well.        Impressions:  No source of anemia found (biopsies pending); no varices    Signed By: [name], MD                        May 17, 2009     1:11 PM

## 2009-05-17 NOTE — Progress Notes (Signed)
 Formatting of this note is different from the original.  Cardiology Progress Note    05/17/2009 2:52 PM    Admit Date: 04/29/2009    Subjective:     No CV sx or complaints.  EGD today ok.    BP 115/56  Pulse 63  Temp 97.6 F (36.4 C)  Resp 22  Ht 5' 2 (1.575 m)  Wt 144 lb 13.5 oz (65.7 kg)  SpO2 100%  PF 40 L/min  Current facility-administered medications   Medication Dose Route Frequency   ? TPN ADULT - PERIPHERAL  1,512 mL IntraVENous CONTINUOUS   ? dextrose 5 % - 0.9% NaCl infusion    IntraVENous CONTINUOUS   ? DISCONTD: midazolam (VERSED) injection 0.5-10 mg  0.5-10 mg IntraVENous Multiple   ? DISCONTD: meperidine (DEMEROL) injection 12.5-50 mg  12.5-50 mg IntraVENous Multiple   ? DISCONTD: naloxone (NARCAN) injection 0.4 mg  0.4 mg IntraVENous Multiple   ? DISCONTD: flumazenil (ROMAZICON) 0.1 mg/mL injection 0.2 mg  0.2 mg IntraVENous Multiple   ? DISCONTD: benzocaine (HURRICANE) 20 % spray    Mucous Membrane ONCE   ? DISCONTD: simethicone (MYLICON) 40mg /0.87mL oral drops 80 mg  1.2 mL Oral Multiple   ? rifaximin (XIFAXAN) tablet 400 mg  400 mg Oral TID   ? alteplase (CATHFLO) injection 2 mg  2 mg InterCATHeter ONCE   ? TPN ADULT - PERIPHERAL     IntraVENous CONTINUOUS   ? aspirin chewable tablet 81 mg  81 mg Oral DAILY   ? metoprolol  (LOPRESSOR ) tablet 25 mg  25 mg Oral Q12H   ? levothyroxine (SYNTHROID) injection 25 mcg  25 mcg IntraVENous DAILY   ? DISCONTD: lactulose (CHRONULAC) solution 20 g  20 g Oral BID   ? piperacillin-tazobactam (ZOSYN) 3.375 g in 0.9% sodium chloride  (MBP/ADV) 100 mL MBP  3.375 g IntraVENous Q6H   ? albumin human 5% (BUMINATE) solution 25 g  25 g IntraVENous Q4H PRN   ? pantoprazole  (PROTONIX ) injection 40 mg  40 mg IntraVENous DAILY   ? albuterol /ipratropium (DUONEB ) neb solution   1 Dose Nebulization Q4HWA RT   ? sodium chloride  (NS) flush 10 mL  10 mL InterCATHeter Q8H   ? sodium chloride  (NS) flush 10 mL  10 mL InterCATHeter PRN   ? heparin (porcine) pf 300 Units  300 Units  InterCATHeter PRN   ? sodium chloride  (NS) flush 20 mL  20 mL InterCATHeter PRN   ? acetaminophen  (TYLENOL ) suppository 650 mg  650 mg Rectal Q6H PRN   ? albuterol  (PROVENTIL  VENTOLIN ) nebulizer solution 2.5 mg  2.5 mg Nebulization Q2H PRN   ? thiamine (B-1) 100 mg in 0.9% sodium chloride  50 mL IVPB  100 mg IntraVENous DAILY   ? saline peripheral flush 5 mL  5 mL InterCATHeter PRN         Objective:     Physical Exam:  Chest --clear  CV--rrr Nl S1S2 I/VI sem  Abd--benign  EXt--unchanged    Data Review:   Labs:  Recent Results (from the past 24 hour(s))   METABOLIC PANEL, COMPREHENSIVE    Collection Time    05/17/09  2:55 AM   Component Value Range   ? Sodium 141  136 - 145 (MMOL/L)   ? Potassium 3.8  3.5 - 5.1 (MMOL/L)   ? Chloride 111 (*) 97 - 108 (MMOL/L)   ? CO2 24  21 - 32 (MMOL/L)   ? Anion gap 6  5 - 15 (mmol/L)   ? Glucose  86  65 - 100 (MG/DL)   ? BUN 12  6 - 20 (MG/DL)   ? Creatinine 0.7  0.6 - 1.3 (MG/DL)   ? BUN/Creatinine ratio 17  12 - 20 ( )   ? GFR est AA >60  >60 (ml/min/1.69m2)   ? GFR est non-AA >60  >60 (ml/min/1.49m2)   ? Calcium 9.0  8.5 - 10.1 (MG/DL)   ? Bilirubin, total 1.6 (*) 0.2 - 1.0 (MG/DL)   ? ALT 32  12 - 78 (U/L)   ? AST 29  15 - 37 (U/L)   ? Alk. phosphatase 111  50 - 136 (U/L)   ? Protein, total 6.2 (*) 6.4 - 8.2 (g/dL)   ? Albumin 2.0 (*) 3.5 - 5.0 (g/dL)   ? Globulin 4.2 (*) 2.0 - 4.0 (g/dL)   ? A-G Ratio 0.5 (*) 1.1 - 2.2 ( )   CBC WITH AUTOMATED DIFF    Collection Time    05/17/09  2:55 AM   Component Value Range   ? WBC 7.9  3.6 - 11.0 (K/uL)   ? RBC 2.86 (*) 3.80 - 5.20 (M/uL)   ? HGB 8.0 (*) 11.5 - 16.0 (g/dL)   ? HCT 25.7 (*) 35.0 - 47.0 (%)   ? MCV 89.9  80.0 - 99.0 (FL)   ? MCH 28.0  26.0 - 34.0 (PG)   ? MCHC 31.1  30.0 - 36.5 (g/dL)   ? RDW 22.6 (*) 11.5 - 14.5 (%)   ? PLATELET 98 (*) 150 - 400 (K/uL)   ? NEUTROPHILS 69  32 - 75 (%)   ? LYMPHOCYTES 22  12 - 49 (%)   ? MONOCYTES 6  5 - 13 (%)   ? EOSINOPHILS 2  0 - 7 (%)   ? BASOPHILS 1  0 - 1 (%)   ? ABSOLUTE NEUTS 5.4  1.8  - 8.0 (K/UL)   ? ABSOLUTE LYMPHS 1.7  0.8 - 3.5 (K/UL)   ? ABSOLUTE MONOS 0.5  0.0 - 1.0 (K/UL)   ? ABSOLUTE EOSINS 0.2  0.0 - 0.4 (K/UL)   ? ABSOLUTE BASOS 0.1  0.0 - 0.1 (K/UL)   ? RBC COMMENTS 2+ ANISOCYTOSIS     ? DF SMEAR SCANNED     PROTHROMBIN TIME    Collection Time    05/17/09  2:55 AM   Component Value Range   ? INR 1.4 (*) 0.9 - 1.1 ( )   ? Prothrombin Time-PT 14.4 (*) 9.0 - 11.0 (SECS)   PTT    Collection Time    05/17/09  2:55 AM   Component Value Range   ? aPTT 29.2  24.0 - 33.0 (sec)   GLUCOSE, POC    Collection Time    05/17/09  7:27 AM   Component Value Range   ? POC GLUCOSE 79  65 - 105 (mg/dL)   GLUCOSE, POC    Collection Time    05/17/09  7:29 AM   Component Value Range   ? POC GLUCOSE 87  65 - 105 (mg/dL)   GLUCOSE, POC    Collection Time    05/17/09  7:31 AM   Component Value Range   ? 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    Norleen MICAEL Gelineau, MD      Electronically signed by Gelineau Norleen ORN, MD at 05/17/2009  2:55 PM EDT

## 2009-05-17 NOTE — Progress Notes (Signed)
 Formatting of this note might be different from the original.  Bedside and Verbal shift change report received from offing going nurse.  Electronically signed by Waddell Slough D at 05/17/2009  7:29 AM EDT

## 2009-05-17 NOTE — Progress Notes (Signed)
 Formatting of this note is different from the original.  TRANSFER - OUT REPORT:    Verbal report given to Jon, RN on Breanna Lester  being transferred to [id number] for routine post - op       Report consisted of patient?s Situation, Background, Assessment and   Recommendations(SBAR).     Information from the following report(s) Procedure Summary and MAR was reveiwed with the receiving nurse.    Opportunity for questions and clarification was provided.        Electronically signed by Vevelyn Miller at 05/17/2009  1:28 PM EDT

## 2009-05-17 NOTE — Progress Notes (Signed)
 Formatting of this note might be different from the original.  Bedside and Verbal shift change report given to oncoming nurse.    Electronically signed by Waddell Slough D at 05/17/2009  7:25 PM EDT

## 2009-05-17 NOTE — Procedures (Signed)
 Procedure(s): PR EGD TRANSORAL BIOPSY SINGLE/MULTIPLE  Pre-Procedure Diagnose(s): Anemia; Cirrhosis (HCC); Occult blood in stool  Post-Procedure Diagnose(s): Schatzki's ring; Hiatal hernia  Formatting of this note might be different from the original.  Indications:  See above    Medications:  meperidine 12.5 mg IV    midazolam 1.5 mg IV    Description of Procedure:    Prior to the procedure its objectives, risks, consequences and alternatives were discussed with the patient who then elected to proceed.  The Olympus video endoscope was inserted under direct vision into the mouth and then into the esophagus.  The esohagus looked normal to the squamo-columnar junction.  There were no esophageal varices.  There was a non-obstructing Schatzki ring at 40 cm and an irregular z line.  I took biopsies of the distal esophagus and mid esophagus.  There was some oozing that stopped on its own.  There were no diagnostic abnormalities of the body, fundus, antrum, cardia and incisura of the stomach.  This included direct and retroflexion examination.  The first and second portion of the duodenum appeared normal.      I took biopsies of the stomach and of the duodenum.      Complications: There were no apparent complications and the patient tolerated the procedure well.      Impressions:  No source of anemia found (biopsies pending); no varices    Signed By: Levander PHEBE Angle, MD                        May 17, 2009     1:11 PM      Electronically signed by Angle Levander FALCON, MD at 05/17/2009  1:14 PM EDT

## 2009-05-17 NOTE — Progress Notes (Signed)
 Formatting of this note is different from the original.  Problem: Mobility Impaired (Adult)  Goal: *Acute Goals and Plan of Care (Insert Text)  Physical Therapy Goals  Initiated 05/12/2009  1. Patient will move from supine to sit and sit to supine , scoot up and down and roll side to side in bed with moderate assistance within 7 day(s).   2. Patient will transfer from bed to chair and chair to bed with moderate assistance using the least restrictive device within 7 day(s).  3. Patient will perform sit to stand with moderate assistance within 7 day(s).  4. Patient will be able to sit at EOB x 5 minutes while maintaining midline with minimum assist within 7 days.   5. Patient will perform 1 sets of 15 repetitions of active strengthening exercises for bilateral upper and lower extremity(s) with supervision/set-up within 7 day(s).     PHYSICAL THERAPY TREATMENT    NAME: Breanna Lester AGE: 48 y.o.  GENDER: female  DATE: 05/17/2009  PRIMARY DIAGNOSIS: AMS, hypokalemia, lft upper extremity pain  AMS, hypokalemia, lft upper extremity pain  AMS, hypokalemia, lft upper extremity pain  Colonoscopy  <principal problem not specified>  Procedure(s) (LRB):  COLONOSCOPY (N/A)       Precautions: Fall, Contact  Chart , physical therapy assessment, plan of care and goals  w ere  reviewed.    SUBJECTIVE:   Patient stated ? I feel so weak .?    OBJECTIVE DATA SUMMARY:   Critical Behavior:  Level of Consciousness: Alert;Confused  Orientation Level: Oriented to person;Oriented to place  Cognition: Decreased attention/concentration;Follows commands;Recognition of people/places  Safety/Judgement: Not assessed  Functional Mobility Training:  Bed Mobility:  Rolling: Additional time;Minimal  assistance  Supine to Sit: Additional time;Minimal assistance  Scooting: Additional time;Minimum assistance  to SBA  Transfers:  Sit to Stand: Assist X2;Moderate assistance  4 attempts:  1st attempt unable to stand completely but each attempt pt  improved  Verbal cues to bring body over BOS  Stand pivot transfer: Max assist x 2  Balance:  Sitting: Impaired;With support  Sitting - Static: Poor (constant support)   Sitting - Dynamic: Poor but progressing to fair  Standing: With support;Without support (Poor)  Neuro Re-Education:  Sitting EOB: 6 min- able to maintain trunk upright with CGA to Min assist while performing exercises   Verbal cues to avoid weight bearing through LUE  Therapeutic Exercises:  Long Arc Quads: 1 x 10 bilateral  SOB during exercises    Pain:  Pt stated no pain    Activity Tolerance: Per nursing  Vitals Assessment 1:  Patient Position : At rest  BP: 109/58 mmHg  Pulse (Heart Rate): 60   O2 Sat (%): 92 %  O2 Device: Room air          After treatment:  [X]     Patient left in no apparent distress  [X]     Call bell left within reach  [ ]     Caregiver present  [X]     Patient left up in chair  with bed alarm on , nursing staff notified    ASSESSMENT AND PROGRESSION TOWARD GOALS:   Pt able to perform bed mobility with Min assist and able to perform exercises while maintaining balance with CGA to min assist.  Pt still slightly confused at times during session.  Recommend SNF rehab at this time on discharge.    Patient?s progression toward goals is as follows:  [ ]     Improving appropriately and progressing  toward goals  [X]     Improving slowly and progressing toward goals  [ ]     Not making progress toward goals and plan of care will be adjusted    PLAN OF CARE:   Patient continues to benefit from skilled intervention to address the above impairments.  Continue treatment per established plan of care.  Planned Interventions:  [X]     Bed Mobility Training              [ ]     Neuromuscular Re-education  [X]     Transfer Training                      [ ]     Orthotic/Prosthetic Training  [ ]     Gait Training                               [ ]     Modalities  [X]    Therapeutic Exercises             [ ]     Edema Management/Control  [X]     Therapeutic  Activities              [X]     Patient and Family Training/Education  [ ]     Other (comment):  Discharge Recommendations:  [ ]     To be determined                      [ ]     Day rehabilitation program  [ ]     Home without services              [ ]     Outpatient therapy  [ ]     Home with Home Health services        [X]     Nursing facility for rehab  [ ]     Inpatient Rehab Hospital           [ ]     Nursing facility for long term care  [ ]     Other:  Further Equipment Recommendations for Discharge: TBD  Communication/Collaboration:  The patient?s plan of care was discussed with:  Certified Occupational Therapy Assistant and Registered Nurse    Haven A. Mason  Time Calculation: 22 mins      Electronically signed by Jonette Shadow A at 05/17/2009 10:31 AM EDT

## 2009-05-17 NOTE — Progress Notes (Signed)
 Formatting of this note is different from the original.       Hospitalist Progress Note         NAME: Breanna Lester        DOB:  10/13/1960        MRN:  [id number]      Assessment & Plan   Anemia, dark stools, diarrhea: getting better, H an H stable. Stool negative for CDT.  Positive for occult blood for EGD today. 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 off vanco and LVQ. Continue Zosyn to complete 14 day course.  No evidence of UTI or ascites(SBP), has an umbilical hernia that is not     reducible but overall adbomen is soft, has diarrhea with dark stools    Altered mental status : multifactorial Sepsis, hypernatremia, ARF, hyperamonemia   Has improved.Hepatic encephalopathy (05/01/2009) on rifaximin now, GI input appreciated. Taken off lactulose because of diarrhea, still has rectal tube in place.    NSTEMI:on ASA and beta-blockers, Received  Lovenox  earlier, Cardiology in the case, ischemic workup when stable either before d/c or as otpatient    Hypothyroidism: c/w  L-Thyroxine 25mcg IV.    Gait abnormality (05/01/2009) B12, folate normal, PT/OT     Nutrition: Currently on TPN, More awake today. Will try honey rhick liquids diet today after EGD.    Left wrist Fracture: casted Ortho following the case    Acute respiratory failure:got extubated doing well off the ventilator    ARF: resolved    Rhabdomyolisis: resolved    Hypernatremia: resolved, now off IVF.        Subjective:     48yo caucasian female    Chief Complaint:  belly pain better , awake , follows commands, still has some diarrhea. Her productive cough is better.    Discussed with RN events overnight.     Review of Systems:  Fever/chills N   Cough y   Sputum    y   SOB/DOE  N   Chest Pain N   Abdominal Pain N   Diarrhea Y   Constipation N   Nausea/Vomiting N   Dysuria N   Tolerating PT N   Tolerating Diet N   Could not obtain  due to AMS vs intubation        Objective:     VITALS:   Last 24hrs VS reviewed since prior progress note. Most recent are:  Visit Vitals   Item Reading   ? BP 109/58   ? Pulse 60   ? Temp 97.7 F (36.5 C)   ? Resp 20   ? Ht 5' 2 (1.575 m)   ? Wt 144 lb 13.5 oz (65.7 kg)   ? SpO2 92%   ? PF 40L/min     Intake/Output Summary (Last 24 hours) at 05/17/09 1016  Last data filed at 05/17/09 0700   Gross per 24 hour   Intake 1024.35 ml   Output   3550 ml   Net -2525.65 ml       Telemetry Reviewed:     PHYSICAL EXAM:  General: Extubated, awake ,   HEENT: Pupils unequal, R 3mm, L 2mm  Lungs:             Occasional rhonchi b/l  Heart:  Regular rhythm, tachycardic, SEM  Abdomen: Soft, Non distended, hypoactive BS,RUQ pain,  no rebound, umbilical hernia non reducible,  Extremities: No edema,  pulses present, brace in left arm  Neurologic: GCS [id number], no gross deficit  Psych: Alert and awake today    Lab Data Reviewed: (see below)    Medications Reviewed: (see below)    PMH/SH reviewed - no change compared to H&P  ______________________________________________________________________      Care Plan discussed with:  Patient x   Family    RN x   Care Manager    Consultant/Specialist      >50% of visit spent in counseling and coordination of care       Prophylaxis:  GI PPI   DVT SCD     Disposition:   Home with Family    HH/PT/OT/RN    SNF/LTC x   [facility]      ______________________________________________________________________  Attending Physician: Kerney Meter, MD   _____________________________________________________________________________________________________  Procedures: see electronic medical records for all procedures/Xrays and details  which were not copied into this note but were reviewed prior to creation of Plan.      LABS:  Recent Labs   Basename 05/17/09 0255 05/16/09 0415   ? WBC 7.9 8.5   ? HGB 8.0* 8.4*   ? HCT 25.7* 27.4*   ? PLT 98* 92*     Recent Labs   Basename 05/17/09 0255 05/16/09 0415 05/15/09 0336   ? NA  141 139 134*   ? K 3.8 3.9 4.9   ? CL 111* 107 106   ? CO2 24 21 18*   ? BUN 12 14 15    ? CREA 0.7 0.7 0.8   ? GLU 86 115* 327*   ? CA 9.0 9.2 9.1   ? MG -- -- --   ? PHOS -- -- --   ? URICA -- -- --     Recent Labs   Basename 05/17/09 0255 05/16/09 0415 05/15/09 0336   ? SGOT 29 25 26    ? GPT 32 35 36   ? AP 111 116 109   ? TBIL 1.6* 1.8* 1.9*   ? TP 6.2* 6.3* 5.9*   ? ALB 2.0* 2.1* 1.9*   ? GLOB 4.2* 4.2* 4.0   ? GGT -- -- --   ? AML -- -- --   ? LPSE -- -- --     Recent Labs   Basename 05/17/09 0255   ? INR 1.4*   ? PTP 14.4*   ? APTT --         No results found for this basename: FE:2,TIBC:2,PSAT:2,FERR:2, in the last 72 hours     No results found for this basename: PH:2,PCO2:2,PO2:2, in the last 72 hours    No results found for this basename: CPK:3,CKMB:3,TROPONINI:3, in the last 72 hours    Lab Results   Component Value Date/Time    POC GLUCOSE 83 05/17/2009  7:31 AM    POC GLUCOSE 87 05/17/2009  7:29 AM    POC GLUCOSE 79 05/17/2009  7:27 AM    POC GLUCOSE 124 05/16/2009 11:54 AM    POC GLUCOSE 109 05/16/2009  7:04 AM     Lab Results   Component Value Date/Time    Color DARK YELLOW 05/09/2009 11:05 AM    Appearance CLEAR 05/09/2009 11:05 AM    Specific gravity 1.010 10/29/2008 11:00 AM    Specific gravity 1.024 05/09/2009 11:05 AM    pH 6.0 05/09/2009 11:05 AM    Protein TRACE 05/09/2009 11:05 AM    Glucose NEGATIVE  05/09/2009 11:05 AM    Ketone NEGATIVE  05/09/2009  11:05 AM    Bilirubin NEGATIVE  04/30/2009 12:14 AM    Urobilinogen 0.2 05/09/2009 11:05 AM    Nitrites NEGATIVE  05/09/2009 11:05 AM    Leukocyte Esterase SMALL 05/09/2009 11:05 AM    Epithelial cells 0-5 05/09/2009 11:05 AM    Bacteria NEGATIVE  05/09/2009 11:05 AM    WBC 5-10 05/09/2009 11:05 AM    RBC 10-20 05/09/2009 11:05 AM     MEDICATIONS:  Current facility-administered medications   Medication Dose Route Frequency   ? rifaximin (XIFAXAN) tablet 400 mg  400 mg Oral TID   ? alteplase (CATHFLO) injection 2 mg  2 mg InterCATHeter ONCE   ? TPN ADULT - PERIPHERAL      IntraVENous CONTINUOUS   ? aspirin chewable tablet 81 mg  81 mg Oral DAILY   ? metoprolol  (LOPRESSOR ) tablet 25 mg  25 mg Oral Q12H   ? lactulose (CHRONULAC) solution 20 g  20 g Oral BID   ? levothyroxine (SYNTHROID) injection 25 mcg  25 mcg IntraVENous DAILY   ? piperacillin-tazobactam (ZOSYN) 3.375 g in 0.9% sodium chloride  (MBP/ADV) 100 mL MBP  3.375 g IntraVENous Q6H   ? albumin human 5% (BUMINATE) solution 25 g  25 g IntraVENous Q4H PRN   ? pantoprazole  (PROTONIX ) injection 40 mg  40 mg IntraVENous DAILY   ? albuterol /ipratropium (DUONEB ) neb solution   1 Dose Nebulization Q4HWA RT   ? sodium chloride  (NS) flush 10 mL  10 mL InterCATHeter Q8H   ? sodium chloride  (NS) flush 10 mL  10 mL InterCATHeter PRN   ? heparin (porcine) pf 300 Units  300 Units InterCATHeter PRN   ? sodium chloride  (NS) flush 20 mL  20 mL InterCATHeter PRN   ? acetaminophen  (TYLENOL ) suppository 650 mg  650 mg Rectal Q6H PRN   ? albuterol  (PROVENTIL  VENTOLIN ) nebulizer solution 2.5 mg  2.5 mg Nebulization Q2H PRN   ? thiamine (B-1) 100 mg in 0.9% sodium chloride  50 mL IVPB  100 mg IntraVENous DAILY   ? saline peripheral flush 5 mL  5 mL InterCATHeter PRN       Electronically signed by Dominique Cons, MD at 05/17/2009 10:26 AM EDT

## 2009-05-17 NOTE — Progress Notes (Signed)
 Formatting of this note might be different from the original.  Dr Dominique called in reference to pt not receiving TPN after yesterday's dose was completed.  MD stated that he will renew order today since pt is not consuming adequate meal intake.   Will f/u  Electronically signed by Waddell Slough D at 05/17/2009  7:28 AM EDT

## 2009-05-17 NOTE — H&P (Signed)
 Formatting of this note might be different from the original.  Pre-endoscopy H and P    The patient was seen and examined in the endoscopy suite.  The airway was assessed and docuemeted.  The problem list and medications were reviewed.     Anemia    Heme pos stool    Review of systems is:  negative for shortness of breath or chest pain    The heart, lungs and mental status were satisfactory for the administration of conscious sedation and for the procedure.      I discussed with the patient the objectives, risks, consequences and alternatives to the procedure.      Levander PHEBE Angle, MD  05/17/2009  12:57 PM      Electronically signed by Angle Levander FALCON, MD at 05/17/2009 12:57 PM EDT

## 2009-05-17 NOTE — Progress Notes (Signed)
 Formatting of this note is different from the original.  Gastroenterology Progress Note    05/17/2009    Admit Date: 04/29/2009    Subjective:     Follow up for:  1)  Hepatic encephalopathy    2) Anemia    Patient is on Regular.    Pain: Patient complains of abdominal pain no.    Bowel Movements: rectal tube    There is no bleeding    Current facility-administered medications   Medication Dose Route Frequency   ? TPN ADULT - PERIPHERAL  1,512 mL IntraVENous CONTINUOUS   ? dextrose 5 % - 0.9% NaCl infusion    IntraVENous CONTINUOUS   ? DISCONTD: midazolam (VERSED) injection 0.5-10 mg  0.5-10 mg IntraVENous Multiple   ? DISCONTD: meperidine (DEMEROL) injection 12.5-50 mg  12.5-50 mg IntraVENous Multiple   ? DISCONTD: naloxone (NARCAN) injection 0.4 mg  0.4 mg IntraVENous Multiple   ? DISCONTD: flumazenil (ROMAZICON) 0.1 mg/mL injection 0.2 mg  0.2 mg IntraVENous Multiple   ? DISCONTD: benzocaine (HURRICANE) 20 % spray    Mucous Membrane ONCE   ? DISCONTD: simethicone (MYLICON) 40mg /0.37mL oral drops 80 mg  1.2 mL Oral Multiple   ? rifaximin (XIFAXAN) tablet 400 mg  400 mg Oral TID   ? alteplase (CATHFLO) injection 2 mg  2 mg InterCATHeter ONCE   ? TPN ADULT - PERIPHERAL     IntraVENous CONTINUOUS   ? aspirin chewable tablet 81 mg  81 mg Oral DAILY   ? metoprolol  (LOPRESSOR ) tablet 25 mg  25 mg Oral Q12H   ? levothyroxine (SYNTHROID) injection 25 mcg  25 mcg IntraVENous DAILY   ? DISCONTD: lactulose (CHRONULAC) solution 20 g  20 g Oral BID   ? piperacillin-tazobactam (ZOSYN) 3.375 g in 0.9% sodium chloride  (MBP/ADV) 100 mL MBP  3.375 g IntraVENous Q6H   ? albumin human 5% (BUMINATE) solution 25 g  25 g IntraVENous Q4H PRN   ? pantoprazole  (PROTONIX ) injection 40 mg  40 mg IntraVENous DAILY   ? albuterol /ipratropium (DUONEB ) neb solution   1 Dose Nebulization Q4HWA RT   ? sodium chloride  (NS) flush 10 mL  10 mL InterCATHeter Q8H   ? sodium chloride  (NS) flush 10 mL  10 mL InterCATHeter PRN   ? heparin (porcine) pf 300 Units   300 Units InterCATHeter PRN   ? sodium chloride  (NS) flush 20 mL  20 mL InterCATHeter PRN   ? acetaminophen  (TYLENOL ) suppository 650 mg  650 mg Rectal Q6H PRN   ? albuterol  (PROVENTIL  VENTOLIN ) nebulizer solution 2.5 mg  2.5 mg Nebulization Q2H PRN   ? thiamine (B-1) 100 mg in 0.9% sodium chloride  50 mL IVPB  100 mg IntraVENous DAILY   ? saline peripheral flush 5 mL  5 mL InterCATHeter PRN         Objective:     Blood pressure 115/56, pulse 63, temperature 97.6 F (36.4 C), resp. rate 22, height 5' 2 (1.575 m), weight 144 lb 13.5 oz (65.7 kg), SpO2 100.00%, peak flow 40 L/min.    In: 400 (400 I.V.)  Out: 2500 (2000 Urine 500 Drains)      In: 2213.1 (360 P.O. 1853.1 I.V.)  Out: 4650 (4150 Urine 500 Drains)    Gen:  Appears older than stated age  Eyes:  incteric scleare  Neuro:  Oriented to place and time; asterixis is present in the right upper extremity  Abdomen:  bs +; no obvious ascites    Data Review  Recent Results (from  the past 24 hour(s))   METABOLIC PANEL, COMPREHENSIVE    Collection Time    05/17/09  2:55 AM   Component Value Range   ? Sodium 141  136 - 145 (MMOL/L)   ? Potassium 3.8  3.5 - 5.1 (MMOL/L)   ? Chloride 111 (*) 97 - 108 (MMOL/L)   ? CO2 24  21 - 32 (MMOL/L)   ? Anion gap 6  5 - 15 (mmol/L)   ? Glucose 86  65 - 100 (MG/DL)   ? BUN 12  6 - 20 (MG/DL)   ? Creatinine 0.7  0.6 - 1.3 (MG/DL)   ? BUN/Creatinine ratio 17  12 - 20 ( )   ? GFR est AA >60  >60 (ml/min/1.70m2)   ? GFR est non-AA >60  >60 (ml/min/1.62m2)   ? Calcium 9.0  8.5 - 10.1 (MG/DL)   ? Bilirubin, total 1.6 (*) 0.2 - 1.0 (MG/DL)   ? ALT 32  12 - 78 (U/L)   ? AST 29  15 - 37 (U/L)   ? Alk. phosphatase 111  50 - 136 (U/L)   ? Protein, total 6.2 (*) 6.4 - 8.2 (g/dL)   ? Albumin 2.0 (*) 3.5 - 5.0 (g/dL)   ? Globulin 4.2 (*) 2.0 - 4.0 (g/dL)   ? A-G Ratio 0.5 (*) 1.1 - 2.2 ( )   CBC WITH AUTOMATED DIFF    Collection Time    05/17/09  2:55 AM   Component Value Range   ? WBC 7.9  3.6 - 11.0 (K/uL)   ? RBC 2.86 (*) 3.80 - 5.20 (M/uL)   ?  HGB 8.0 (*) 11.5 - 16.0 (g/dL)   ? HCT 25.7 (*) 35.0 - 47.0 (%)   ? MCV 89.9  80.0 - 99.0 (FL)   ? MCH 28.0  26.0 - 34.0 (PG)   ? MCHC 31.1  30.0 - 36.5 (g/dL)   ? RDW 22.6 (*) 11.5 - 14.5 (%)   ? PLATELET 98 (*) 150 - 400 (K/uL)   ? NEUTROPHILS 69  32 - 75 (%)   ? LYMPHOCYTES 22  12 - 49 (%)   ? MONOCYTES 6  5 - 13 (%)   ? EOSINOPHILS 2  0 - 7 (%)   ? BASOPHILS 1  0 - 1 (%)   ? ABSOLUTE NEUTS 5.4  1.8 - 8.0 (K/UL)   ? ABSOLUTE LYMPHS 1.7  0.8 - 3.5 (K/UL)   ? ABSOLUTE MONOS 0.5  0.0 - 1.0 (K/UL)   ? ABSOLUTE EOSINS 0.2  0.0 - 0.4 (K/UL)   ? ABSOLUTE BASOS 0.1  0.0 - 0.1 (K/UL)   ? RBC COMMENTS 2+ ANISOCYTOSIS     ? DF SMEAR SCANNED     PROTHROMBIN TIME    Collection Time    05/17/09  2:55 AM   Component Value Range   ? INR 1.4 (*) 0.9 - 1.1 ( )   ? Prothrombin Time-PT 14.4 (*) 9.0 - 11.0 (SECS)   PTT    Collection Time    05/17/09  2:55 AM   Component Value Range   ? aPTT 29.2  24.0 - 33.0 (sec)   GLUCOSE, POC    Collection Time    05/17/09  7:27 AM   Component Value Range   ? POC GLUCOSE 79  65 - 105 (mg/dL)   GLUCOSE, POC    Collection Time    05/17/09  7:29 AM   Component Value Range   ? POC  GLUCOSE 87  65 - 105 (mg/dL)   GLUCOSE, POC    Collection Time    05/17/09  7:31 AM   Component Value Range   ? 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    Electronically signed by Conan Levander FALCON, MD at 05/17/2009  5:03 PM EDT

## 2009-05-17 NOTE — Progress Notes (Signed)
Gastroenterology Progress Note    05/17/2009    Admit Date: 04/29/2009    Subjective:     Follow up for:  1)  Hepatic encephalopathy    2) Anemia    Patient is on Regular.    Pain: Patient complains of abdominal pain no.    Bowel Movements: rectal tube    There is no bleeding            Current facility-administered medications   Medication Dose Route Frequency   ??? TPN ADULT - PERIPHERAL  1,512 mL IntraVENous CONTINUOUS   ??? dextrose 5 % - 0.9% NaCl infusion    IntraVENous CONTINUOUS   ??? DISCONTD: midazolam (VERSED) injection 0.5-10 mg  0.5-10 mg IntraVENous Multiple   ??? DISCONTD: meperidine (DEMEROL) injection 12.5-50 mg  12.5-50 mg IntraVENous Multiple   ??? DISCONTD: naloxone (NARCAN) injection 0.4 mg  0.4 mg IntraVENous Multiple   ??? DISCONTD: flumazenil (ROMAZICON) 0.1 mg/mL injection 0.2 mg  0.2 mg IntraVENous Multiple   ??? DISCONTD: benzocaine (HURRICANE) 20 % spray    Mucous Membrane ONCE   ??? DISCONTD: simethicone (MYLICON) 40mg /0.40mL oral drops 80 mg  1.2 mL Oral Multiple   ??? rifaximin (XIFAXAN) tablet 400 mg  400 mg Oral TID   ??? alteplase (CATHFLO) injection 2 mg  2 mg InterCATHeter ONCE   ??? TPN ADULT - PERIPHERAL     IntraVENous CONTINUOUS   ??? aspirin chewable tablet 81 mg  81 mg Oral DAILY   ??? metoprolol (LOPRESSOR) tablet 25 mg  25 mg Oral Q12H   ??? levothyroxine (SYNTHROID) injection 25 mcg  25 mcg IntraVENous DAILY   ??? DISCONTD: lactulose (CHRONULAC) solution 20 g  20 g Oral BID   ??? piperacillin-tazobactam (ZOSYN) 3.375 g in 0.9% sodium chloride (MBP/ADV) 100 mL MBP  3.375 g IntraVENous Q6H   ??? albumin human 5% (BUMINATE) solution 25 g  25 g IntraVENous Q4H PRN   ??? pantoprazole (PROTONIX) injection 40 mg  40 mg IntraVENous DAILY   ??? albuterol/ipratropium (DUONEB) neb solution   1 Dose Nebulization Q4HWA RT   ??? sodium chloride (NS) flush 10 mL  10 mL InterCATHeter Q8H   ??? sodium chloride (NS) flush 10 mL  10 mL InterCATHeter PRN   ??? heparin (porcine) pf 300 Units  300 Units InterCATHeter PRN    ??? sodium chloride (NS) flush 20 mL  20 mL InterCATHeter PRN   ??? acetaminophen (TYLENOL) suppository 650 mg  650 mg Rectal Q6H PRN   ??? albuterol (PROVENTIL VENTOLIN) nebulizer solution 2.5 mg  2.5 mg Nebulization Q2H PRN   ??? thiamine (B-1) 100 mg in 0.9% sodium chloride 50 mL IVPB  100 mg IntraVENous DAILY   ??? saline peripheral flush 5 mL  5 mL InterCATHeter PRN          Objective:     Blood pressure 115/56, pulse 63, temperature 97.6 ??F (36.4 ??C), resp. rate 22, height 5\' 2"  (1.575 m), weight 144 lb 13.5 oz (65.7 kg), SpO2 100.00%, peak flow 40 L/min.    In: 400 (400 I.V.)  Out: 2500 (2000 Urine 500 Drains)      In: 2213.1 (360 P.O. 1853.1 I.V.)  Out: 4650 (4150 Urine 500 Drains)    Gen:  Appears older than stated age  Eyes:  incteric scleare  Neuro:  Oriented to place and time; asterixis is present in the right upper extremity  Abdomen:  bs +; no obvious ascites    Data Review  Recent Results (from the  past 24 hour(s))   METABOLIC PANEL, COMPREHENSIVE    Collection Time    05/17/09  2:55 AM   Component Value Range   ??? Sodium 141  136 - 145 (MMOL/L)   ??? Potassium 3.8  3.5 - 5.1 (MMOL/L)   ??? Chloride 111 (*) 97 - 108 (MMOL/L)   ??? CO2 24  21 - 32 (MMOL/L)   ??? Anion gap 6  5 - 15 (mmol/L)   ??? Glucose 86  65 - 100 (MG/DL)   ??? BUN 12  6 - 20 (MG/DL)   ??? Creatinine 0.7  0.6 - 1.3 (MG/DL)   ??? BUN/Creatinine ratio 17  12 - 20 ( )   ??? GFR est AA >60  >60 (ml/min/1.13m2)   ??? GFR est non-AA >60  >60 (ml/min/1.40m2)   ??? Calcium 9.0  8.5 - 10.1 (MG/DL)   ??? Bilirubin, total 1.6 (*) 0.2 - 1.0 (MG/DL)   ??? ALT 32  12 - 78 (U/L)   ??? AST 29  15 - 37 (U/L)   ??? Alk. phosphatase 111  50 - 136 (U/L)   ??? Protein, total 6.2 (*) 6.4 - 8.2 (g/dL)   ??? Albumin 2.0 (*) 3.5 - 5.0 (g/dL)   ??? Globulin 4.2 (*) 2.0 - 4.0 (g/dL)   ??? A-G Ratio 0.5 (*) 1.1 - 2.2 ( )   CBC WITH AUTOMATED DIFF    Collection Time    05/17/09  2:55 AM   Component Value Range   ??? WBC 7.9  3.6 - 11.0 (K/uL)   ??? RBC 2.86 (*) 3.80 - 5.20 (M/uL)    ??? HGB 8.0 (*) 11.5 - 16.0 (g/dL)   ??? HCT 25.7 (*) 35.0 - 47.0 (%)   ??? MCV 89.9  80.0 - 99.0 (FL)   ??? MCH 28.0  26.0 - 34.0 (PG)   ??? MCHC 31.1  30.0 - 36.5 (g/dL)   ??? RDW 22.6 (*) 11.5 - 14.5 (%)   ??? PLATELET 98 (*) 150 - 400 (K/uL)   ??? NEUTROPHILS 69  32 - 75 (%)   ??? LYMPHOCYTES 22  12 - 49 (%)   ??? MONOCYTES 6  5 - 13 (%)   ??? EOSINOPHILS 2  0 - 7 (%)   ??? BASOPHILS 1  0 - 1 (%)   ??? ABSOLUTE NEUTS 5.4  1.8 - 8.0 (K/UL)   ??? ABSOLUTE LYMPHS 1.7  0.8 - 3.5 (K/UL)   ??? ABSOLUTE MONOS 0.5  0.0 - 1.0 (K/UL)   ??? ABSOLUTE EOSINS 0.2  0.0 - 0.4 (K/UL)   ??? ABSOLUTE BASOS 0.1  0.0 - 0.1 (K/UL)   ??? RBC COMMENTS 2+ ANISOCYTOSIS     ??? DF SMEAR SCANNED     PROTHROMBIN TIME    Collection Time    05/17/09  2:55 AM   Component Value Range   ??? INR 1.4 (*) 0.9 - 1.1 ( )   ??? Prothrombin Time-PT 14.4 (*) 9.0 - 11.0 (SECS)   PTT    Collection Time    05/17/09  2:55 AM   Component Value Range   ??? aPTT 29.2  24.0 - 33.0 (sec)   GLUCOSE, POC    Collection Time    05/17/09  7:27 AM   Component Value Range   ??? POC GLUCOSE 79  65 - 105 (mg/dL)   GLUCOSE, POC    Collection Time    05/17/09  7:29 AM   Component Value Range   ??? POC GLUCOSE  87  65 - 105 (mg/dL)   GLUCOSE, POC    Collection Time    05/17/09  7:31 AM   Component Value Range   ??? 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

## 2009-05-17 NOTE — Progress Notes (Signed)
Problem: Self Care Deficits Care Plan (Adult)  Goal: *Acute Goals and Plan of Care (Insert Text)  Occupational Therapy Goals Initiated 05/11/09    1. Pt will complete grooming in supported position with minimal assistance within 7 days.  2. Pt will increase fine motor skills to open task containers (toothpaste, lotion, ect) with set up assist within 7 days.  3. When appropriate, transfer training in preparation for functional mobility with be initiated and appropriate goal will be set.  4. Pt will tolerated x 10 min edge of bed with minimal assistance while completing dynamic balance activity within 7 days.  5. Pt will move from supine to sit with minimal assistance within 7 days, in preparation for functional mobility.   OCCUPATIONAL THERAPY TREATMENT    NAME: Breanna Lester AGE: 48 y.o.  GENDER: female  DATE: 05/17/2009  PRIMARY DIAGNOSIS: AMS, hypokalemia, lft upper extremity pain  AMS, hypokalemia, lft upper extremity pain  AMS, hypokalemia, lft upper extremity pain  Colonoscopy  <principal problem not specified>  Procedure(s) (LRB):  COLONOSCOPY (N/A)        Precautions: fall, contact    Chart, occupational therapy assessment, plan of care, and goals were reviewed.      SUBJECTIVE:   Patient stated ??? I can't believe how weak I feel .???     OBJECTIVE DATA SUMMARY:   Cognitive/Behavioral Status:  Level of Consciousness: Alert;Confused  Orientation Level: Oriented to person;Oriented to place  Cognition: Decreased attention/concentration;Follows commands;Recognition of people/places  Safety/Judgement: Not assessed  Functional Mobility and Transfers for ADLs:              Bed Mobility:  Assisted PTA with bed mobility  Rolling: Additional time;Minimal  assistance  Supine to Sit: Additional time;Minimal assistance  Scooting: Additional time;Minimum assistance              Transfers:              Sit to Stand: Assist X2;Moderate assistance (multiple sit-stand transfers to assist with cleaning patien t )             Bed to Chair: Maximum assistance;Assist X2 ((stand pivot))              Balance:  Sitting: Impaired  Sitting - Static: Poor (constant support);Fair (occasional) ((poor progressing to fair))  Sitting - Dynamic:  (poor progressing to fair-patient min assist for dynamic bala nce  )  Standing: Impaired  Standing - Static: Constant support;Poor  Standing - Dynamic : Impaired  ADL Intervention:  Assisted PTA with sit-stand transfers.  Grooming  Grooming Assistance: Moderate assistance  Brushing/Combing Hair:  (requires  assist/ support at elbows to raise right arm high enough to  comb hair )  Seated edge of bed with minimum assistance for dynamic balance    Upper Body Dressing Assistance  Dressing Assistance: Moderate assistance (secondary to decreased bilateral strength in UE, wrist cast,  and IV site )  with minimum assistance for balance seated edge of bed    Lower Body Dressing Assistance  Socks: Compensatory technique training (educated patient with hemi technique. A  ble to cross legs, bu t unable to don sock secondary to fatigue (patient had been sitting EOB for 6 minutes), balance, and difficulty with technique. )  Leg Crossed Method Used: Yes  Position Performed: Seated edge of bed (CGA-min assist for maintenance of balance)  Cues: Donn;Visual cues provided;Physical assistance    Had patient practice manipulating and opening different containers to increase FM coordination and hand  strength needed for performance of ADLs. Patient able to open 1/2 containers with difficulty.    Cognitive Retraining  Safety/Judgement: Not assessed     Therapeutic Exercises: Performed the following exercises seated in chair. Educated patient with HEP for UEs and its benefits. Encouraged patient to perform throughout the day to increase ROM and strength needed for performance of functional transfers and ADLs. Patient verbalizes understanding.    EXERCISE   Sets   Reps   Active Active Assist   Passive   Comments    Shoulder shrugs 1 10 [X]  [ ]  [ ]      Elbow flexion/extension 1 10 [X]  [ ]  [ ]  Decreased ROM on L UE secondary to pain   Forearm Pronation/Supination 1 5 [X]  [ ]  [ ]  Decreased ROM on L UE secondary to pain   Finger Oppostion 1 10 [X]  [ ]  [ ]  Unable to fully oppose on L hand.       Shoulder flexion/extension 1 10 [ ]  [X]  [ ]              Also encouraged patient to move/wiggle fingers of L hand throughout the day to decrease stiffness and edema. Patient c/o pain and stiff feeling in fingers.  Pain:  Pain Scale: Verbal  Pain Intensity: patient states some pain, unable to rate.  Pain Location: Arm    Activity Tolerance: Vitals stable throughout treatment    After treatment:  [X]     Patient left in NAD  [X]     Call bell left within reach  [ ]     Caregiver present  [X]     Patient left up in chair, nursing staff notified      ASSESSMENT AND PROGRESSION TOWARD GOALS:   Patient pleasant and agreeable to therapy. Appeared somewhat confused, but easily reoriented and able to follow commands. Patient presents with decreased FM coordination, balance, activity tolerance, BUE strength and ROM, increased pain, and GW. Patient will continue to benefit from therapy to address deficits. At this time, patient is appropriate for SNF rehab, but as she progresses may become appropriate for inpatient rehabilitation to address deficits.    Patient???s progression toward goals is as follows:  [ ]     Improving appropriately and progressing toward goals  [X]     Improving slowly and progressing toward goals  [ ]     Not making progress toward goals and plan of care will be adjusted      PLAN OF CARE:   Patient continues to benefit from skilled intervention to address the above impairments.  Continue treatment per established plan of care.  Planned Interventions:  [X]     Self Care Training                   [X]     Therapeutic Activities  [X]     Functional Mobility Training    [X]     Cognitive Retraining   [X]     Therapeutic Exercises             [X]     Endurance Activities  [X]     Balance Training                      [ ]     Neuromuscular Re-education  [X]     Patient Education                     [ ]     Family Training/Education  [ ]     Visual/Perceptual Training        [ ]   Home safety training  [ ]     Other (Comment):  Discharge Recommendations:  [ ]     To be determined                                  [ ]     Day rehabilitation program  [ ]     Home without services  (home safety education provided)  [ ]     Home with Home Health services  (home safety education provided)  [ ]    Outpatient therapy                                   [X]     Nursing facility for rehab  [X]     Inpatient Rehab Hospital  vs.              [ ]     Nursing facility for long term care  [ ]     Other:  Further Equipment Recommendations for Discharge: tbd  Communication/Collaboration:  The patient???s plan of care was discussed with:  Physical Therapy Assistant, Registered Nurse    Vinnie Langton. Sherrie George, OTA -Applicant  Time Calculation: 38  mins

## 2009-05-17 NOTE — Progress Notes (Signed)
TRANSFER - OUT REPORT:    Verbal report given to Marylene Land, RN on Breanna Lester  being transferred to rm [id number] for routine post - op       Report consisted of patient???s Situation, Background, Assessment and   Recommendations(SBAR).     Information from the following report(s) Procedure Summary and MAR was reveiwed with the receiving nurse.    Opportunity for questions and clarification was provided.

## 2009-05-17 NOTE — Progress Notes (Signed)
Pt foley discontinued as ordered.

## 2009-05-17 NOTE — Progress Notes (Signed)
Cardiology Progress Note      05/17/2009 2:52 PM    Admit Date: 04/29/2009      Subjective:     No CV sx or complaints.  EGD today ok.    BP 115/56   Pulse 63   Temp 97.6 ??F (36.4 ??C)   Resp 22   Ht 5\' 2"  (1.575 m)   Wt 144 lb 13.5 oz (65.7 kg)   SpO2 100%   PF 40 L/min  Current facility-administered medications   Medication Dose Route Frequency   ??? TPN ADULT - PERIPHERAL  1,512 mL IntraVENous CONTINUOUS   ??? dextrose 5 % - 0.9% NaCl infusion    IntraVENous CONTINUOUS   ??? DISCONTD: midazolam (VERSED) injection 0.5-10 mg  0.5-10 mg IntraVENous Multiple   ??? DISCONTD: meperidine (DEMEROL) injection 12.5-50 mg  12.5-50 mg IntraVENous Multiple   ??? DISCONTD: naloxone (NARCAN) injection 0.4 mg  0.4 mg IntraVENous Multiple   ??? DISCONTD: flumazenil (ROMAZICON) 0.1 mg/mL injection 0.2 mg  0.2 mg IntraVENous Multiple   ??? DISCONTD: benzocaine (HURRICANE) 20 % spray    Mucous Membrane ONCE   ??? DISCONTD: simethicone (MYLICON) 40mg /0.32mL oral drops 80 mg  1.2 mL Oral Multiple   ??? rifaximin (XIFAXAN) tablet 400 mg  400 mg Oral TID   ??? alteplase (CATHFLO) injection 2 mg  2 mg InterCATHeter ONCE   ??? TPN ADULT - PERIPHERAL     IntraVENous CONTINUOUS   ??? aspirin chewable tablet 81 mg  81 mg Oral DAILY   ??? metoprolol (LOPRESSOR) tablet 25 mg  25 mg Oral Q12H   ??? levothyroxine (SYNTHROID) injection 25 mcg  25 mcg IntraVENous DAILY   ??? DISCONTD: lactulose (CHRONULAC) solution 20 g  20 g Oral BID   ??? piperacillin-tazobactam (ZOSYN) 3.375 g in 0.9% sodium chloride (MBP/ADV) 100 mL MBP  3.375 g IntraVENous Q6H   ??? albumin human 5% (BUMINATE) solution 25 g  25 g IntraVENous Q4H PRN   ??? pantoprazole (PROTONIX) injection 40 mg  40 mg IntraVENous DAILY   ??? albuterol/ipratropium (DUONEB) neb solution   1 Dose Nebulization Q4HWA RT   ??? sodium chloride (NS) flush 10 mL  10 mL InterCATHeter Q8H   ??? sodium chloride (NS) flush 10 mL  10 mL InterCATHeter PRN   ??? heparin (porcine) pf 300 Units  300 Units InterCATHeter PRN    ??? sodium chloride (NS) flush 20 mL  20 mL InterCATHeter PRN   ??? acetaminophen (TYLENOL) suppository 650 mg  650 mg Rectal Q6H PRN   ??? albuterol (PROVENTIL VENTOLIN) nebulizer solution 2.5 mg  2.5 mg Nebulization Q2H PRN   ??? thiamine (B-1) 100 mg in 0.9% sodium chloride 50 mL IVPB  100 mg IntraVENous DAILY   ??? saline peripheral flush 5 mL  5 mL InterCATHeter PRN           Objective:      Physical Exam:  Chest --clear  CV--rrr Nl S1S2 I/VI sem  Abd--benign  EXt--unchanged    Data Review:   Labs:  Recent Results (from the past 24 hour(s))   METABOLIC PANEL, COMPREHENSIVE    Collection Time    05/17/09  2:55 AM   Component Value Range   ??? Sodium 141  136 - 145 (MMOL/L)   ??? Potassium 3.8  3.5 - 5.1 (MMOL/L)   ??? Chloride 111 (*) 97 - 108 (MMOL/L)   ??? CO2 24  21 - 32 (MMOL/L)   ??? Anion gap 6  5 - 15 (mmol/L)   ???  Glucose 86  65 - 100 (MG/DL)   ??? BUN 12  6 - 20 (MG/DL)   ??? Creatinine 0.7  0.6 - 1.3 (MG/DL)   ??? BUN/Creatinine ratio 17  12 - 20 ( )   ??? GFR est AA >60  >60 (ml/min/1.65m2)   ??? GFR est non-AA >60  >60 (ml/min/1.16m2)   ??? Calcium 9.0  8.5 - 10.1 (MG/DL)   ??? Bilirubin, total 1.6 (*) 0.2 - 1.0 (MG/DL)   ??? ALT 32  12 - 78 (U/L)   ??? AST 29  15 - 37 (U/L)   ??? Alk. phosphatase 111  50 - 136 (U/L)   ??? Protein, total 6.2 (*) 6.4 - 8.2 (g/dL)   ??? Albumin 2.0 (*) 3.5 - 5.0 (g/dL)   ??? Globulin 4.2 (*) 2.0 - 4.0 (g/dL)   ??? A-G Ratio 0.5 (*) 1.1 - 2.2 ( )   CBC WITH AUTOMATED DIFF    Collection Time    05/17/09  2:55 AM   Component Value Range   ??? WBC 7.9  3.6 - 11.0 (K/uL)   ??? RBC 2.86 (*) 3.80 - 5.20 (M/uL)   ??? HGB 8.0 (*) 11.5 - 16.0 (g/dL)   ??? HCT 25.7 (*) 35.0 - 47.0 (%)   ??? MCV 89.9  80.0 - 99.0 (FL)   ??? MCH 28.0  26.0 - 34.0 (PG)   ??? MCHC 31.1  30.0 - 36.5 (g/dL)   ??? RDW 22.6 (*) 11.5 - 14.5 (%)   ??? PLATELET 98 (*) 150 - 400 (K/uL)   ??? NEUTROPHILS 69  32 - 75 (%)   ??? LYMPHOCYTES 22  12 - 49 (%)   ??? MONOCYTES 6  5 - 13 (%)   ??? EOSINOPHILS 2  0 - 7 (%)   ??? BASOPHILS 1  0 - 1 (%)   ??? ABSOLUTE NEUTS 5.4  1.8 - 8.0 (K/UL)    ??? ABSOLUTE LYMPHS 1.7  0.8 - 3.5 (K/UL)   ??? ABSOLUTE MONOS 0.5  0.0 - 1.0 (K/UL)   ??? ABSOLUTE EOSINS 0.2  0.0 - 0.4 (K/UL)   ??? ABSOLUTE BASOS 0.1  0.0 - 0.1 (K/UL)   ??? RBC COMMENTS 2+ ANISOCYTOSIS     ??? DF SMEAR SCANNED     PROTHROMBIN TIME    Collection Time    05/17/09  2:55 AM   Component Value Range   ??? INR 1.4 (*) 0.9 - 1.1 ( )   ??? Prothrombin Time-PT 14.4 (*) 9.0 - 11.0 (SECS)   PTT    Collection Time    05/17/09  2:55 AM   Component Value Range   ??? aPTT 29.2  24.0 - 33.0 (sec)   GLUCOSE, POC    Collection Time    05/17/09  7:27 AM   Component Value Range   ??? POC GLUCOSE 79  65 - 105 (mg/dL)   GLUCOSE, POC    Collection Time    05/17/09  7:29 AM   Component Value Range   ??? POC GLUCOSE 87  65 - 105 (mg/dL)   GLUCOSE, POC    Collection Time    05/17/09  7:31 AM   Component Value Range   ??? 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      Ellwood Sayers, MD

## 2009-05-17 NOTE — Progress Notes (Signed)
Hospitalist Progress Note         NAME: Breanna Lester        DOB:  August 11, 1961        MRN:  [id number]      Assessment & Plan   Anemia, dark stools, diarrhea: getting better, H an H stable. Stool negative for CDT.  Positive for occult blood for EGD today. 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 off vanco and LVQ. Continue Zosyn to complete 14 day course.  ????????????????????????No evidence of UTI or ascites(SBP), has an umbilical hernia that is not     reducible but overall adbomen is soft, has diarrhea with dark stools    Altered mental status : multifactorial Sepsis, hypernatremia, ARF, hyperamonemia   Has improved.Hepatic encephalopathy (05/01/2009) on rifaximin now, GI input appreciated. Taken off lactulose because of diarrhea, still has rectal tube in place.    NSTEMI:on ASA and beta-blockers, Received  Lovenox earlier, Cardiology in the case, ischemic workup when stable either before d/c or as otpatient    Hypothyroidism: c/w  L-Thyroxine IV.    Gait abnormality (05/01/2009) B12, folate normal, PT/OT     Nutrition: Currently on TPN, More awake today. Will try honey rhick liquids diet today after EGD.    Left wrist Fracture: casted Ortho following the case    Acute respiratory failure:got extubated doing well off the ventilator    ARF: resolved    Rhabdomyolisis: resolved    Hypernatremia: resolved, now off IVF.                     Subjective:     48yo caucasian female    Chief Complaint:  belly pain better , awake , follows commands, still has some diarrhea. Her productive cough is better.    Discussed with RN events overnight.     Review of Systems:  Fever/chills N   Cough y   Sputum    y   SOB/DOE  N   Chest Pain N   Abdominal Pain N   Diarrhea Y   Constipation N   Nausea/Vomiting N   Dysuria N   Tolerating PT N   Tolerating Diet N   Could not obtain due to AMS vs intubation          Objective:       VITALS:   Last 24hrs VS reviewed since prior progress note. Most recent are:  Visit Vitals   Item Reading   ??? BP 109/58   ??? Pulse 60   ??? Temp 97.7 ??F (36.5 ??C)   ??? Resp 20   ??? Ht 5\' 2"  (1.575 m)   ??? Wt 144 lb 13.5 oz (65.7 kg)   ??? SpO2 92%   ??? PF 40L/min           Intake/Output Summary (Last 24 hours) at 05/17/09 1016  Last data filed at 05/17/09 0700   Gross per 24 hour   Intake 1024.35 ml   Output   3550 ml   Net -2525.65 ml        Telemetry Reviewed:     PHYSICAL EXAM:  General: Extubated, awake ,   HEENT: Pupils unequal, R 3mm, L 2mm  Lungs:             Occasional rhonchi b/l  Heart:  Regular rhythm, tachycardic, SEM  Abdomen: Soft, Non distended, hypoactive BS,RUQ pain,  no rebound,  umbilical hernia non reducible,  Extremities: No edema, pulses present, brace in left arm  Neurologic:?? GCS [id number], no gross deficit  Psych:???? Alert and awake today    Lab Data Reviewed: (see below)    Medications Reviewed: (see below)    PMH/SH reviewed - no change compared to H&P  ______________________________________________________________________        Care Plan discussed with:  Patient x   Family    RN x   Care Manager    Consultant/Specialist      >50% of visit spent in counseling and coordination of care       Prophylaxis:  GI PPI   DVT SCD     Disposition:   Home with Family    HH/PT/OT/RN    SNF/LTC x   [facility]      ______________________________________________________________________  Attending Physician: Ayesha Rumpf, MD   _____________________________________________________________________________________________________  Procedures: see electronic medical records for all procedures/Xrays and details  which were not copied into this note but were reviewed prior to creation of Plan.      LABS:  Recent Labs   Basename 05/17/09 0255 05/16/09 0415   ??? WBC 7.9 8.5   ??? HGB 8.0* 8.4*   ??? HCT 25.7* 27.4*   ??? PLT 98* 92*         Recent Labs   Basename 05/17/09 0255 05/16/09 0415 05/15/09 0336   ??? NA 141 139 134*    ??? K 3.8 3.9 4.9   ??? CL 111* 107 106   ??? CO2 24 21 18*   ??? BUN 12 14 15    ??? CREA 0.7 0.7 0.8   ??? GLU 86 115* 327*   ??? CA 9.0 9.2 9.1   ??? MG -- -- --   ??? PHOS -- -- --   ??? URICA -- -- --         Recent Labs   Basename 05/17/09 0255 05/16/09 0415 05/15/09 0336   ??? SGOT 29 25 26    ??? GPT 32 35 36   ??? AP 111 116 109   ??? TBIL 1.6* 1.8* 1.9*   ??? TP 6.2* 6.3* 5.9*   ??? ALB 2.0* 2.1* 1.9*   ??? GLOB 4.2* 4.2* 4.0   ??? GGT -- -- --   ??? AML -- -- --   ??? LPSE -- -- --         Recent Labs   Basename 05/17/09 0255   ??? INR 1.4*   ??? PTP 14.4*   ??? APTT --          No results found for this basename: FE:2,TIBC:2,PSAT:2,FERR:2, in the last 72 hours     No results found for this basename: PH:2,PCO2:2,PO2:2, in the last 72 hours      No results found for this basename: CPK:3,CKMB:3,TROPONINI:3, in the last 72 hours    Lab Results   Component Value Date/Time    POC GLUCOSE 83 05/17/2009  7:31 AM    POC GLUCOSE 87 05/17/2009  7:29 AM    POC GLUCOSE 79 05/17/2009  7:27 AM    POC GLUCOSE 124 05/16/2009 11:54 AM    POC GLUCOSE 109 05/16/2009  7:04 AM         Lab Results   Component Value Date/Time    Color DARK YELLOW 05/09/2009 11:05 AM    Appearance CLEAR 05/09/2009 11:05 AM    Specific gravity 1.010 10/29/2008 11:00 AM    Specific gravity 1.024 05/09/2009 11:05 AM  pH 6.0 05/09/2009 11:05 AM    Protein TRACE 05/09/2009 11:05 AM    Glucose NEGATIVE  05/09/2009 11:05 AM    Ketone NEGATIVE  05/09/2009 11:05 AM    Bilirubin NEGATIVE  04/30/2009 12:14 AM    Urobilinogen 0.2 05/09/2009 11:05 AM    Nitrites NEGATIVE  05/09/2009 11:05 AM    Leukocyte Esterase SMALL 05/09/2009 11:05 AM    Epithelial cells 0-5 05/09/2009 11:05 AM    Bacteria NEGATIVE  05/09/2009 11:05 AM    WBC 5-10 05/09/2009 11:05 AM    RBC 10-20 05/09/2009 11:05 AM           MEDICATIONS:  Current facility-administered medications   Medication Dose Route Frequency   ??? rifaximin (XIFAXAN) tablet 400 mg  400 mg Oral TID   ??? alteplase (CATHFLO) injection 2 mg  2 mg InterCATHeter ONCE    ??? TPN ADULT - PERIPHERAL     IntraVENous CONTINUOUS   ??? aspirin chewable tablet 81 mg  81 mg Oral DAILY   ??? metoprolol (LOPRESSOR) tablet 25 mg  25 mg Oral Q12H   ??? lactulose (CHRONULAC) solution 20 g  20 g Oral BID   ??? levothyroxine (SYNTHROID) injection 25 mcg  25 mcg IntraVENous DAILY   ??? piperacillin-tazobactam (ZOSYN) 3.375 g in 0.9% sodium chloride (MBP/ADV) 100 mL MBP  3.375 g IntraVENous Q6H   ??? albumin human 5% (BUMINATE) solution 25 g  25 g IntraVENous Q4H PRN   ??? pantoprazole (PROTONIX) injection 40 mg  40 mg IntraVENous DAILY   ??? albuterol/ipratropium (DUONEB) neb solution   1 Dose Nebulization Q4HWA RT   ??? sodium chloride (NS) flush 10 mL  10 mL InterCATHeter Q8H   ??? sodium chloride (NS) flush 10 mL  10 mL InterCATHeter PRN   ??? heparin (porcine) pf 300 Units  300 Units InterCATHeter PRN   ??? sodium chloride (NS) flush 20 mL  20 mL InterCATHeter PRN   ??? acetaminophen (TYLENOL) suppository 650 mg  650 mg Rectal Q6H PRN   ??? albuterol (PROVENTIL VENTOLIN) nebulizer solution 2.5 mg  2.5 mg Nebulization Q2H PRN   ??? thiamine (B-1) 100 mg in 0.9% sodium chloride 50 mL IVPB  100 mg IntraVENous DAILY   ??? saline peripheral flush 5 mL  5 mL InterCATHeter PRN

## 2009-05-17 NOTE — Progress Notes (Signed)
Responded to staff request to visit patient, who was requesting to talk to a chaplain.  Provided supportive presence and empathetic listening as patient tearfully verbalized emotions.  Patient described feelings of being overwhelmed in dealing with conflict in her family and her medical diagnoses.  Offered spiritual support as patient raised theological questions.  Offered scriptural assurances, prayer, and guided imagery.  Patient seemed more relaxed, and stated that she thought she could get some sleep.  Pastoral care will continue to support patient.      Visit by Delma Post, MA, Chaplain

## 2009-05-17 NOTE — Progress Notes (Signed)
Nutrition Recommendations:  ?? Liberalize diet to 2g Na continue Mech Soft (discussed with MD, order obtained)  ?? Titrate PPN to d/c once pt consuming >50% of her trays.    Progress Note:  Pt is back on PPN tonight, will provide 771kcal, 64g Pro (did not have PPN 9/26).  Pt's NGT out (pt refused replacement on 9/24).      Diet advanced by SLP today to Mech Soft.  Pt with 75% intake of 1 Full Liquid tray over weekend.    EGD found no source of anemia.  Lactulose d/c'd.  Continues on thiamin.  C. Diff negative 05/15/09.    ______________________________________________________  POC:  Lab Results   Component Value Date/Time    POC GLUCOSE 83 05/17/2009  7:31 AM    POC GLUCOSE 87 05/17/2009  7:29 AM    POC GLUCOSE 79 05/17/2009  7:27 AM    POC GLUCOSE 124 05/16/2009 11:54 AM     Pt's BMI:  Estimated Body mass index is 26.49 kg/(m^2) as calculated from the following:    Height as of this encounter: 5\' 2" (1.575 m).    Weight as of this encounter: 144 lb 13.5 oz(65.7 kg).  Based on the BMI above, pt is overweight    Weight History: Wt Readings from Last 20 Encounters:   05/05/2009 144 lb 13.5 oz (65.7 kg)   05/05/2009 144 lb 13.5 oz (65.7 kg)   11/06/2008 154 lb (69.854 kg)   10/28/2008 149 lb 12.8 oz (67.949 kg)     ______________________________________________________  Thank you  Sonny Dandy, RD  Pager [phone]

## 2009-05-17 NOTE — Progress Notes (Signed)
Chart reviewed. Spoke with RN. Patient to have EGD today around noon. NPO for EGD. Patient was on full liquid diet; RN reports s/s of aspiration. Per last SLP visit on Friday, 05/14/09, recommending NPO given overt s/s of aspiration along with increased RR with PO. SLP to follow up this PM as able and patient appropriate. Thanks.

## 2009-05-17 NOTE — Progress Notes (Signed)
Bedside and Verbal shift change report given to oncoming nurse.

## 2009-05-17 NOTE — Procedures (Signed)
Indications:  See above    Medications:  meperidine 12.5 mg IV    midazolam 1.5 mg IV    Description of Procedure:    Prior to the procedure its objectives, risks, consequences and alternatives were discussed with the patient who then elected to proceed.  The Olympus video endoscope was inserted under direct vision into the mouth and then into the esophagus.  The esohagus looked normal to the squamo-columnar junction.  There were no esophageal varices.  There was a non-obstructing Schatzki ring at 40 cm and an irregular z line.  I took biopsies of the distal esophagus and mid esophagus.  There was some oozing that stopped on its own.  There were no diagnostic abnormalities of the body, fundus, antrum, cardia and incisura of the stomach.  This included direct and retroflexion examination.  The first and second portion of the duodenum appeared normal.      I took biopsies of the stomach and of the duodenum.      Complications: There were no apparent complications and the patient tolerated the procedure well.        Impressions:  No source of anemia found (biopsies pending); no varices    Signed By: Tomoki Lucken F. Dsean Vantol, MD                        May 17, 2009     1:11 PM

## 2009-05-17 NOTE — Progress Notes (Signed)
Problem: Dysphagia (Adult)  Goal: *Acute Goals and Plan of Care (Insert Text)  Speech Therapy Goals  Initiated 05/17/09  1. Patient will tolerate mechanical soft with thin liquids without overt s/s of aspiration within 7 days  2. Patient will tolerate trials of solids without overt s/s of aspiration within 7 days  3. Patient will participate in Integrated Language evaluation within 7 days    Initiated 05/10/09; re-evaluated 05/17/09  1. Patient will participate in re-evaluation of swallow within 7 days MET  2. Patient will tolerate least restrictive diet without signs or symptoms of aspiration within 7 days NOT MET  3. Patient will participate in MBS as needed/appropriate within 7 days NOT MET; not needed  SPEECH LANGUAGE PATHOLOGY DYSPHAGIA TREATMENT: WEEKLY REASSESSMENT    NAME : Breanna Lester AGE: 48 y.o.  GENDER: female  DATE: 05/17/2009  PRIMARY DIAGNOSIS: AMS, hypokalemia, lft upper extremity pain  AMS, hypokalemia, lft upper extremity pain  AMS, hypokalemia, lft upper extremity pain  Colonoscopy  <principal problem not specified>  Procedure(s) (LRB):  ESOPHAGOGASTRODUODENOSCOPY (EGD) (N/A)  ESOPHAGOGASTRODUODENAL (EGD) BIOPSY (N/A)  Day of Surgery       SUBJECTIVE:   Patient stated ???  That applesauce is so good!" NAD noted. Patient laughing inappropriately. Poor attention.      OBJECTIVE:   Cognitive and Communication Status:  Level of Consciousness: Alert  Orientation Level: Oriented to person;Oriented to place  Cognition: Decreased attention/concentration;Impaired decision making;Poor safety awareness  Perception: Appears intact  Perseveration: No perseveration noted  Safety/Judgement: Decreased awareness of need for assistance;Decreased awareness of need for safety    Dysphagia Treatment:  Oral Assessment:  Oral Assessment  Labial: No impairment  Dentition: Extractions;Intact  Oral Hygiene: Xerostomic  Lingual: No impairment  Velum: No impairment    P.O. Trials:  Patient Position: Upright in bed     The patient was given the following:  Consistency Presented: Puree;Solid;Thin liquid  How Presented: Self-fed/presented;SLP-fed/presented;Cup/sip;Spoon;Successive swallows;Straw    ORAL PHASE:  Bolus Acceptance: No impairment  Bolus Formation/Control: No impairment  Propulsion: No impairment  Oral Residue: Lingual  with cracker    PHARYNGEAL PHASE:  Initiation of Swallow: No impairment  Laryngeal Elevation: Functional  Aspiration Signs/Symptoms: Clear throat  x2 with ice chips. Expelled greenish colored mucous afterwards.  Vocal Quality: No impairment  Cues for Modifications: Minimal-moderate  Effective Modifications: Small sips and bites  After treatment:  [X]    Patient left in NAD  [X]    Call bell left within reach  [ ]    Caregiver present  [ ]    Patient left up in chair, nursing staff notified      ASSESSMENT AND PROGRESSION TOWARD GOALS:   Patient presents with no oropharyngeal dysphagia. Patient's cognitive status still places patient at risk for aspiration pneumonia. Patient required consistent verbal cues to not talk while chewing crackers and to take small sips via straw. Cough x2 with expulsion of green colored mucous after ice chips. Appropriate for upgrade to regular diet if she tolerates mechanical soft diet well tonight/tomorrow.    Patient???s progression toward goals since last assessment: Excellent progress. Much more awake/alert and clearer cognitively. Suspect cognitive deficits persist; patient need int. language evaluation.      PLAN OF CARE:   Goals have been updated based on progression since last assessment.    Patient continues to benefit from skilled intervention to address the above impairments.    Continue to follow patient 4 times a week  to address goals.    Recommendations and Planned  Interventions:  SLP treatment    Any new recommendations or planned interventions including recommended diet changes were discussed with:  [X]   Patient   [ ]    Family      [X]    Nursing  [X]    Provider   [X]   Posted safety precautions in patient???s room    Communication/Collaboration:  The patient???s plan of care was discussed with:  Registered Nurse    Reginia Forts, SLP    Time Calculation: 15 mins

## 2009-05-17 NOTE — Progress Notes (Signed)
Dr Ninfa Linden called in reference to pt not receiving TPN after yesterday's dose was completed.  MD stated that he will renew order today since pt is not consuming adequate meal intake.   Will f/u

## 2009-05-17 NOTE — Progress Notes (Signed)
Problem: Mobility Impaired (Adult)  Goal: *Acute Goals and Plan of Care (Insert Text)  Physical Therapy Goals  Initiated 05/12/2009  1. Patient will move from supine to sit and sit to supine , scoot up and down and roll side to side in bed with moderate assistance within 7 day(s).   2. Patient will transfer from bed to chair and chair to bed with moderate assistance using the least restrictive device within 7 day(s).  3. Patient will perform sit to stand with moderate assistance within 7 day(s).  4. Patient will be able to sit at EOB x 5 minutes while maintaining midline with minimum assist within 7 days.   5. Patient will perform 1 sets of 15 repetitions of active strengthening exercises for bilateral upper and lower extremity(s) with supervision/set-up within 7 day(s).     PHYSICAL THERAPY TREATMENT    NAME: Breanna Lester AGE: 48 y.o.  GENDER: female  DATE: 05/17/2009  PRIMARY DIAGNOSIS: AMS, hypokalemia, lft upper extremity pain  AMS, hypokalemia, lft upper extremity pain  AMS, hypokalemia, lft upper extremity pain  Colonoscopy  <principal problem not specified>  Procedure(s) (LRB):  COLONOSCOPY (N/A)       Precautions: Fall, Contact  Chart , physical therapy assessment, plan of care and goals  w ere  reviewed.     SUBJECTIVE:   Patient stated ??? I feel so weak .???     OBJECTIVE DATA SUMMARY:   Critical Behavior:  Level of Consciousness: Alert;Confused  Orientation Level: Oriented to person;Oriented to place  Cognition: Decreased attention/concentration;Follows commands;Recognition of people/places  Safety/Judgement: Not assessed  Functional Mobility Training:  Bed Mobility:  Rolling: Additional time;Minimal  assistance  Supine to Sit: Additional time;Minimal assistance  Scooting: Additional time;Minimum assistance  to SBA  Transfers:  Sit to Stand: Assist X2;Moderate assistance  4 attempts:  1st attempt unable to stand completely but each attempt pt improved  Verbal cues to bring body over BOS   Stand pivot transfer: Max assist x 2  Balance:  Sitting: Impaired;With support  Sitting - Static: Poor (constant support)   Sitting - Dynamic: Poor but progressing to fair  Standing: With support;Without support (Poor)  Neuro Re-Education:  Sitting EOB: 6 min- able to maintain trunk upright with CGA to Min assist while performing exercises   Verbal cues to avoid weight bearing through LUE  Therapeutic Exercises:  Long Arc Quads: 1 x 10 bilateral  SOB during exercises    Pain:  Pt stated no pain    Activity Tolerance: Per nursing  Vitals Assessment 1:  Patient Position : At rest  BP: 109/58 mmHg  Pulse (Heart Rate): 60   O2 Sat (%): 92 %  O2 Device: Room air                  After treatment:  [X]     Patient left in no apparent distress  [X]     Call bell left within reach  [ ]     Caregiver present  [X]     Patient left up in chair  with bed alarm on , nursing staff notified      ASSESSMENT AND PROGRESSION TOWARD GOALS:   Pt able to perform bed mobility with Min assist and able to perform exercises while maintaining balance with CGA to min assist.  Pt still slightly confused at times during session.  Recommend SNF rehab at this time on discharge.    Patient???s progression toward goals is as follows:  [ ]     Improving appropriately  and progressing toward goals  [X]     Improving slowly and progressing toward goals  [ ]     Not making progress toward goals and plan of care will be adjusted      PLAN OF CARE:   Patient continues to benefit from skilled intervention to address the above impairments.  Continue treatment per established plan of care.  Planned Interventions:  [X]     Bed Mobility Training              [ ]     Neuromuscular Re-education  [X]     Transfer Training                      [ ]     Orthotic/Prosthetic Training  [ ]     Gait Training                               [ ]     Modalities  [X]    Therapeutic Exercises             [ ]     Edema Management/Control   [X]     Therapeutic Activities              [X]     Patient and Family Training/Education  [ ]     Other (comment):  Discharge Recommendations:  [ ]     To be determined                      [ ]     Day rehabilitation program  [ ]     Home without services              [ ]     Outpatient therapy  [ ]     Home with Home Health services        [X]     Nursing facility for rehab  [ ]     Inpatient Rehab Hospital           [ ]     Nursing facility for long term care  [ ]     Other:  Further Equipment Recommendations for Discharge: TBD  Communication/Collaboration:  The patient???s plan of care was discussed with:  Certified Occupational Therapy Assistant and Registered Nurse    Lenzy Kerschner A. Aislee Landgren  Time Calculation: 22 mins

## 2009-05-17 NOTE — Progress Notes (Signed)
Bedside and Verbal shift change report received from offing going nurse.

## 2009-05-17 NOTE — Progress Notes (Signed)
Spoke with Dr Teressa Lower in reference to pt receiving medications due to NPO status.  MD stated to hold am meds until after pt has her scope done. Will f/u

## 2009-05-17 NOTE — H&P (Signed)
Pre-endoscopy H and P    The patient was seen and examined in the endoscopy suite.  The airway was assessed and docuemeted.  The problem list and medications were reviewed.     Anemia    Heme pos stool        Review of systems is:  negative for shortness of breath or chest pain      The heart, lungs and mental status were satisfactory for the administration of conscious sedation and for the procedure.      I discussed with the patient the objectives, risks, consequences and alternatives to the procedure.      [name], MD  05/17/2009  12:57 PM

## 2009-05-18 LAB — METABOLIC PANEL, COMPREHENSIVE
A-G Ratio: 0.5 — ABNORMAL LOW (ref 1.1–2.2)
ALT (SGPT): 28 U/L (ref 12–78)
AST (SGOT): 24 U/L (ref 15–37)
Albumin: 2 g/dL — ABNORMAL LOW (ref 3.5–5.0)
Alk. phosphatase: 118 U/L (ref 50–136)
Anion gap: 11 mmol/L (ref 5–15)
BUN/Creatinine ratio: 11 — ABNORMAL LOW (ref 12–20)
BUN: 9 MG/DL (ref 6–20)
Bilirubin, total: 1.5 MG/DL — ABNORMAL HIGH (ref 0.2–1.0)
CO2: 22 MMOL/L (ref 21–32)
Calcium: 9 MG/DL (ref 8.5–10.1)
Chloride: 103 MMOL/L (ref 97–108)
Creatinine: 0.8 MG/DL (ref 0.6–1.3)
GFR est AA: >60 mL/min/{1.73_m2} (ref >60)
GFR est non-AA: >60 mL/min/{1.73_m2} (ref >60)
Globulin: 4.2 g/dL — ABNORMAL HIGH (ref 2.0–4.0)
Glucose: 216 MG/DL — ABNORMAL HIGH (ref 65–100)
Potassium: 4.4 MMOL/L (ref 3.5–5.1)
Protein, total: 6.2 g/dL — ABNORMAL LOW (ref 6.4–8.2)
Sodium: 136 MMOL/L (ref 136–145)

## 2009-05-18 LAB — CBC WITH AUTOMATED DIFF
ABS. BASOPHILS: 0.1 10*3/uL (ref 0.0–0.1)
ABS. EOSINOPHILS: 0.1 10*3/uL (ref 0.0–0.4)
ABS. LYMPHOCYTES: 1.4 10*3/uL (ref 0.8–3.5)
ABS. MONOCYTES: 0.4 10*3/uL (ref 0.0–1.0)
ABS. NEUTROPHILS: 4.8 10*3/uL (ref 1.8–8.0)
BASOPHILS: 1 % (ref 0–1)
EOSINOPHILS: 2 % (ref 0–7)
HCT: 24.9 % — ABNORMAL LOW (ref 35.0–47.0)
HGB: 7.8 g/dL — ABNORMAL LOW (ref 11.5–16.0)
LYMPHOCYTES: 21 % (ref 12–49)
MCH: 28.5 PG (ref 26.0–34.0)
MCHC: 31.3 g/dL (ref 30.0–36.5)
MCV: 90.9 FL (ref 80.0–99.0)
MONOCYTES: 6 % (ref 5–13)
NEUTROPHILS: 70 % (ref 32–75)
PLATELET: 87 10*3/uL — ABNORMAL LOW (ref 150–400)
RBC: 2.74 M/uL — ABNORMAL LOW (ref 3.80–5.20)
RDW: 22.4 % — ABNORMAL HIGH (ref 11.5–14.5)
WBC: 6.8 10*3/uL (ref 3.6–11.0)

## 2009-05-18 LAB — TSH 3RD GENERATION: TSH: <0.01 u[IU]/mL — ABNORMAL LOW (ref 0.36–3.74)

## 2009-05-18 LAB — GLUCOSE, POC
Glucose (POC): 106 mg/dL — ABNORMAL HIGH (ref 65–105)
Glucose (POC): 113 mg/dL — ABNORMAL HIGH (ref 65–105)
Glucose (POC): 210 mg/dL — ABNORMAL HIGH (ref 65–105)
Glucose (POC): 105 mg/dL (ref 65–105)

## 2009-05-18 LAB — T4, FREE: T4, Free: 1.5 NG/DL (ref 0.8–1.5)

## 2009-05-18 MED ADMIN — sodium chloride (NS) flush 10 mL: @ 02:00:00 | NDC 87701099893

## 2009-05-18 MED ADMIN — TPN ADULT - PERIPHERAL: INTRAVENOUS | NDC 00517293025

## 2009-05-18 MED ADMIN — thiamine (B-1) 100 mg in 0.9% sodium chloride 50 mL IVPB: INTRAVENOUS | @ 13:00:00 | NDC 63323001302

## 2009-05-18 MED ADMIN — metoprolol (LOPRESSOR) tablet 25 mg: ORAL | @ 02:00:00 | NDC 51079025501

## 2009-05-18 MED ADMIN — saline peripheral flush 5 mL: @ 23:00:00 | NDC 87701099893

## 2009-05-18 MED ADMIN — albuterol/ipratropium (DUONEB) neb solution: RESPIRATORY_TRACT | @ 11:00:00 | NDC 00487980101

## 2009-05-18 MED ADMIN — lorazepam (ATIVAN) injection 1 mg: INTRAVENOUS | @ 19:00:00 | NDC 10019010239

## 2009-05-18 MED ADMIN — piperacillin-tazobactam (ZOSYN) 3.375 g in 0.9% sodium chloride (MBP/ADV) 100 mL MBP: INTRAVENOUS | @ 17:00:00 | NDC 00338055318

## 2009-05-18 MED ADMIN — albuterol/ipratropium (DUONEB) neb solution: RESPIRATORY_TRACT | NDC 00487980101

## 2009-05-18 MED ADMIN — pneumococcal vaccine (PNU-IMMUNE) injection 0.5 mL: INTRAMUSCULAR | @ 23:00:00 | NDC 00006494300

## 2009-05-18 MED ADMIN — rifaximin (XIFAXAN) tablet 400 mg: ORAL | @ 23:00:00 | NDC 65649030105

## 2009-05-18 MED ADMIN — metoprolol (LOPRESSOR) tablet 25 mg: ORAL | @ 13:00:00 | NDC 51079025501

## 2009-05-18 MED ADMIN — albuterol/ipratropium (DUONEB) neb solution: RESPIRATORY_TRACT | @ 02:00:00 | NDC 00487980101

## 2009-05-18 MED ADMIN — aspirin chewable tablet 81 mg: ORAL | @ 13:00:00

## 2009-05-18 MED ADMIN — rifaximin (XIFAXAN) tablet 400 mg: ORAL | @ 13:00:00 | NDC 65649030105

## 2009-05-18 MED ADMIN — sodium chloride (NS) flush 10 mL: @ 10:00:00 | NDC 87701099893

## 2009-05-18 MED ADMIN — levothyroxine (SYNTHROID) injection 25 mcg: INTRAVENOUS | @ 13:00:00 | NDC 63323024710

## 2009-05-18 MED ADMIN — piperacillin-tazobactam (ZOSYN) 3.375 g in 0.9% sodium chloride (MBP/ADV) 100 mL MBP: INTRAVENOUS | @ 04:00:00 | NDC 00338055318

## 2009-05-18 MED ADMIN — piperacillin-tazobactam (ZOSYN) 3.375 g in 0.9% sodium chloride (MBP/ADV) 100 mL MBP: INTRAVENOUS | @ 10:00:00 | NDC 00338055318

## 2009-05-18 MED ADMIN — pantoprazole (PROTONIX) injection 40 mg: INTRAVENOUS | @ 13:00:00 | NDC 00409488810

## 2009-05-18 MED ADMIN — sodium chloride (NS) flush 10 mL: @ 17:00:00 | NDC 87701099893

## 2009-05-18 MED ADMIN — albuterol/ipratropium (DUONEB) neb solution: RESPIRATORY_TRACT | @ 20:00:00 | NDC 00487980101

## 2009-05-18 MED ADMIN — piperacillin-tazobactam (ZOSYN) 3.375 g in 0.9% sodium chloride (MBP/ADV) 100 mL MBP: INTRAVENOUS | @ 23:00:00 | NDC 00338055318

## 2009-05-18 MED ADMIN — rifaximin (XIFAXAN) tablet 400 mg: ORAL | @ 02:00:00 | NDC 65649030105

## 2009-05-18 MED ADMIN — albuterol/ipratropium (DUONEB) neb solution: RESPIRATORY_TRACT | @ 15:00:00 | NDC 00487980101

## 2009-05-18 MED FILL — SALINE FLUSH INJECTION SYRINGE: INTRAMUSCULAR | Qty: 10

## 2009-05-18 MED FILL — ZOSYN 3.375 GRAM INTRAVENOUS SOLUTION: 3.375 gram | INTRAVENOUS | Qty: 3.38

## 2009-05-18 MED FILL — XIFAXAN 200 MG TABLET: 200 mg | ORAL | Qty: 2

## 2009-05-18 MED FILL — PNEUMOVAX-23 25 MCG/0.5 ML INJECTION SOLUTION: 25 mcg/0.5 mL | INTRAMUSCULAR | Qty: 0.5

## 2009-05-18 MED FILL — METOPROLOL TARTRATE 25 MG TAB: 25 mg | ORAL | Qty: 1

## 2009-05-18 MED FILL — SALINE FLUSH INJECTION SYRINGE: INTRAMUSCULAR | Qty: 40

## 2009-05-18 MED FILL — THIAMINE 100 MG/ML INJECTION: 100 mg/mL | INTRAMUSCULAR | Qty: 1

## 2009-05-18 MED FILL — SALINE FLUSH INJECTION SYRINGE: INTRAMUSCULAR | Qty: 30

## 2009-05-18 MED FILL — ALBUTEROL SULFATE 0.083 % (0.83 MG/ML) SOLN FOR INHALATION: 2.5 mg /3 mL (0.083 %) | RESPIRATORY_TRACT | Qty: 1

## 2009-05-18 MED FILL — SALINE FLUSH INJECTION SYRINGE: INTRAMUSCULAR | Qty: 20

## 2009-05-18 MED FILL — LORAZEPAM 2 MG/ML IJ SOLN: 2 mg/mL | INTRAMUSCULAR | Qty: 1

## 2009-05-18 MED FILL — LEVOTHYROXINE 200 MCG IJ SOLR: 200 mcg | INTRAVENOUS | Qty: 5

## 2009-05-18 MED FILL — SODIUM CHLORIDE 0.9 % INJECTION: INTRAMUSCULAR | Qty: 10

## 2009-05-18 MED FILL — ASPIRIN 81 MG CHEWABLE TAB: 81 mg | ORAL | Qty: 1

## 2009-05-18 NOTE — Progress Notes (Signed)
 Formatting of this note might be different from the original.  Attempted to treat pt but pt again quite agitated and crying on arrival.  She stated her daughter needs help and was just in her room.  Pt would not agree to therapy and was trying to get out of bed.   Notified nursing.  Will follow up tomorrow.  Electronically signed by Jonette Shadow A at 05/18/2009  2:04 PM EDT

## 2009-05-18 NOTE — Progress Notes (Signed)
 Formatting of this note might be different from the original.  Attempted to treat pt but on arrival pt agitated and confused about her situation.  Pt refused to work with PT and was trying to get up on her own.  Nursing notified.  Nursing asked to hold PT till later today to see if pt is less confused and upset.  Electronically signed by Jonette Shadow A at 05/18/2009 12:36 PM EDT

## 2009-05-18 NOTE — Progress Notes (Signed)
 Formatting of this note is different from the original.  Gastroenterology Progress Note    05/18/2009    Admit Date: 04/29/2009    Subjective:     Follow up for:  1)  Encephalopathy    2) Anemia    Patient is on Regular.    Pain: Patient complains of abdominal pain no.  Bowel Movements: the rectal tube is out; there is no blood per nurese.    There is no bleeding    Current facility-administered medications   Medication Dose Route Frequency   ? lorazepam (ATIVAN) injection 1 mg  1 mg IntraVENous Q4H PRN   ? pneumococcal vaccine (PNU-IMMUNE) injection 0.5 mL  0.5 mL IntraMUSCular ONCE   ? TPN ADULT - PERIPHERAL  1,512 mL IntraVENous CONTINUOUS   ? dextrose 5 % - 0.9% NaCl infusion    IntraVENous CONTINUOUS   ? rifaximin (XIFAXAN) tablet 400 mg  400 mg Oral TID   ? aspirin chewable tablet 81 mg  81 mg Oral DAILY   ? metoprolol  (LOPRESSOR ) tablet 25 mg  25 mg Oral Q12H   ? DISCONTD: levothyroxine (SYNTHROID) injection 25 mcg  25 mcg IntraVENous DAILY   ? piperacillin-tazobactam (ZOSYN) 3.375 g in 0.9% sodium chloride  (MBP/ADV) 100 mL MBP  3.375 g IntraVENous Q6H   ? albumin human 5% (BUMINATE) solution 25 g  25 g IntraVENous Q4H PRN   ? pantoprazole  (PROTONIX ) injection 40 mg  40 mg IntraVENous DAILY   ? albuterol /ipratropium (DUONEB ) neb solution   1 Dose Nebulization Q4HWA RT   ? sodium chloride  (NS) flush 10 mL  10 mL InterCATHeter Q8H   ? sodium chloride  (NS) flush 10 mL  10 mL InterCATHeter PRN   ? heparin (porcine) pf 300 Units  300 Units InterCATHeter PRN   ? sodium chloride  (NS) flush 20 mL  20 mL InterCATHeter PRN   ? acetaminophen  (TYLENOL ) suppository 650 mg  650 mg Rectal Q6H PRN   ? albuterol  (PROVENTIL  VENTOLIN ) nebulizer solution 2.5 mg  2.5 mg Nebulization Q2H PRN   ? thiamine (B-1) 100 mg in 0.9% sodium chloride  50 mL IVPB  100 mg IntraVENous DAILY   ? saline peripheral flush 5 mL  5 mL InterCATHeter PRN         Objective:     Blood pressure 130/68, pulse 55, temperature 98.5 F (36.9 C), resp. rate 20,  height 5' 2 (1.575 m), weight 144 lb 13.5 oz (65.7 kg), SpO2 99.00%, peak flow 40 L/min.        In: 940 (240 P.O. 700 I.V.)  Out: 4125 (3625 Urine 500 Drains)      Somnolent (received Ativan)    Not oriented (August)    Spleen is enlarger; bs +    Data Review  Recent Results (from the past 24 hour(s))   GLUCOSE, POC    Collection Time    05/17/09  5:04 PM   Component Value Range   ? POC GLUCOSE 89  65 - 105 (mg/dL)   GLUCOSE, POC    Collection Time    05/18/09 12:45 AM   Component Value Range   ? POC GLUCOSE 106 (*) 65 - 105 (mg/dL)   METABOLIC PANEL, COMPREHENSIVE    Collection Time    05/18/09  2:55 AM   Component Value Range   ? Sodium 136  136 - 145 (MMOL/L)   ? Potassium 4.4  3.5 - 5.1 (MMOL/L)   ? Chloride 103  97 - 108 (MMOL/L)   ? CO2  22  21 - 32 (MMOL/L)   ? Anion gap 11  5 - 15 (mmol/L)   ? Glucose 216 (*) 65 - 100 (MG/DL)   ? BUN 9  6 - 20 (MG/DL)   ? Creatinine 0.8  0.6 - 1.3 (MG/DL)   ? BUN/Creatinine ratio 11 (*) 12 - 20 ( )   ? GFR est AA >60  >60 (ml/min/1.21m2)   ? GFR est non-AA >60  >60 (ml/min/1.6m2)   ? Calcium 9.0  8.5 - 10.1 (MG/DL)   ? Bilirubin, total 1.5 (*) 0.2 - 1.0 (MG/DL)   ? ALT 28  12 - 78 (U/L)   ? AST 24  15 - 37 (U/L)   ? Alk. phosphatase 118  50 - 136 (U/L)   ? Protein, total 6.2 (*) 6.4 - 8.2 (g/dL)   ? Albumin 2.0 (*) 3.5 - 5.0 (g/dL)   ? Globulin 4.2 (*) 2.0 - 4.0 (g/dL)   ? A-G Ratio 0.5 (*) 1.1 - 2.2 ( )   CBC WITH AUTOMATED DIFF    Collection Time    05/18/09  2:55 AM   Component Value Range   ? WBC 6.8  3.6 - 11.0 (K/uL)   ? RBC 2.74 (*) 3.80 - 5.20 (M/uL)   ? HGB   Down slightly  7.8 (*) 11.5 - 16.0 (g/dL)   ? HCT 24.9 (*) 35.0 - 47.0 (%)   ? MCV 90.9  80.0 - 99.0 (FL)   ? MCH 28.5  26.0 - 34.0 (PG)   ? MCHC 31.3  30.0 - 36.5 (g/dL)   ? RDW 22.4 (*) 11.5 - 14.5 (%)   ? PLATELET 87 (*) 150 - 400 (K/uL)   ? NEUTROPHILS 70  32 - 75 (%)   ? LYMPHOCYTES 21  12 - 49 (%)   ? MONOCYTES 6  5 - 13 (%)   ? EOSINOPHILS 2  0 - 7 (%)   ? BASOPHILS 1  0 - 1 (%)   ? ABSOLUTE NEUTS 4.8  1.8  - 8.0 (K/UL)   ? ABSOLUTE LYMPHS 1.4  0.8 - 3.5 (K/UL)   ? ABSOLUTE MONOS 0.4  0.0 - 1.0 (K/UL)   ? ABSOLUTE EOSINS 0.1  0.0 - 0.4 (K/UL)   ? ABSOLUTE BASOS 0.1  0.0 - 0.1 (K/UL)   ? RBC COMMENTS 2+ ANISOCYTOSIS     ? DF SMEAR SCANNED     GLUCOSE, POC    Collection Time    05/18/09  7:20 AM   Component Value Range   ? POC GLUCOSE 113 (*) 65 - 105 (mg/dL)   GLUCOSE, POC    Collection Time    05/18/09 11:46 AM   Component Value Range   ? POC GLUCOSE 210 (*) 65 - 105 (mg/dL)         Assessment:     Patient Active Hospital Problem List:  Nausea with vomiting (05/01/2009)    Hypokalemia (05/01/2009)    Hepatic encephalopathy (05/01/2009)    Gait abnormality (05/01/2009)    Alcoholic cirrhosis of liver (05/01/2009)    Hypomagnesemia (05/01/2009)    Plan:     1)  She needs to be listed for a transplant.  Her insurance is an issue with this  2)  Await biopsies from yesterday's procedure  3)  Percent saturation and ferritin  4)  Add xifaxin for encephalopathy  5)  Will follow  6)  ? Role for colonoscopy; will discuss with hospitalist.    Levander FALCON.  Conan, MD  4:11 PM  05/18/2009      Electronically signed by Conan Levander FALCON, MD at 05/18/2009  4:11 PM EDT

## 2009-05-18 NOTE — Progress Notes (Signed)
 Formatting of this note might be different from the original.  Pts rectal tube fell out while using the bed pan.   Electronically signed by Everitt Avelina NOVAK, RN at 05/18/2009  3:15 AM EDT

## 2009-05-18 NOTE — Progress Notes (Signed)
 Formatting of this note might be different from the original.  Insurance claims handler Review of Student Work    05/18/2009  - Shift times - 1700 to 2200    The student documentation of patient care for Breanna Lester has been reviewed and approved.  All medications have been administered under the direct supervision of the faculty or preceptor.    Electronically signed by Lanell Simonne HERO, RN at 05/18/2009  9:21 PM EDT

## 2009-05-18 NOTE — Progress Notes (Signed)
 Formatting of this note might be different from the original.  Insurance claims handler Review of Student Work    05/18/2009  - Shift times - *1700 to 2200    The student documentation of patient care for Breanna Lester has been reviewed and approved.  All medications have been administered under the direct supervision of the faculty or preceptor.    Electronically signed by Lanell Simonne HERO, RN at 05/18/2009  9:22 PM EDT

## 2009-05-18 NOTE — Progress Notes (Signed)
 Formatting of this note might be different from the original.  Spoke with PT who just attempted to see patient for treatment. Per PT patient became agitated and delusional up her attempt to work with the patient. Patients behavior escalated upon attempts to console her. Will hold OT reevaluation today and follow up tomorrow.  Electronically signed by Amado Elouise SQUIBB, OTR/L at 05/18/2009  2:13 PM EDT

## 2009-05-18 NOTE — Progress Notes (Signed)
 Formatting of this note is different from the original.       Hospitalist Progress Note         NAME: Breanna Lester        DOB:  03-04-1961        MRN:  [id number]      Assessment & Plan   Sepsis syndrome :underlying pneumonia, fever, leukocytosis, resolved. off vanco and LVQ. Continue Zosyn to complete 14 day course.  No evidence of UTI or ascites(SBP), has an umbilical hernia that is not     reducible but overall adbomen is soft, has diarrhea with dark stools    Altered mental status : multifactorial Sepsis, hypernatremia, ARF, hyperamonemia   Has improved.Hepatic encephalopathy (05/01/2009) on rifaximin now, GI input appreciated. Taken off lactulose because of diarrhea, still has rectal tube in place.    NSTEMI:on ASA and beta-blockers, Received  Lovenox  earlier, Cardiology in the case, ischemic workup when stable either before d/c or as otpatient    Anemia, dark stools, diarrhea: egd revealed Schatzki's ring/ HH, bx pending.  S/p egd, no obvious bleed source. Hgb to 7.8, will transfuse lower.    Hypothyroidism(?) tsh suppressed < 0.01, hold levothyroxine, recheck tsh/ft4.    Gait abnormality (05/01/2009) B12, folate normal, PT/OT     Nutrition: Currently on TPN, . Advancing diet, likely stop tpn aftyer tomorrow if intake is better.    Left wrist Fracture: casted Ortho following the case    Acute respiratory failure:got extubated doing well off the ventilator    ARF: resolved    Rhabdomyolisis: resolved    Hypernatremia: resolved, now off IVF.      Subjective:     [name] is a 48 y.o. Caucasian female whom presents with complaint of left forearm pain, nausea and vomiting.  Patient with a hx of cirrohsis presents ambulatory to ED with cc of vomiting. Pt's relative reports nausea and vomiting all day. Per family member, when pt experiences N/V secondary to cirrhosis, it usually is associated with a fever. Pt's relative reports that pt fractured her ulna and radius 2 days ago after falling and that  fractures were never set, just wrapped. Pt's relative also notes that pt's vision has been worsening, indicating this as the reason she fell 2 days ago   We were asked to admit for work up and evaluation of the above problems.       Chief Complaint: Remains pleasantly confused convinced that husband is lying in her bed as she sits at bedside in nad. Per nursing she's been seeing things for a couple days.    Discussed with RN events overnight.     Review of Systems:  Fever/chills N   Cough y   Sputum    y   SOB/DOE  N   Chest Pain N   Abdominal Pain N   Diarrhea N   Constipation N   Nausea/Vomiting N   Dysuria N   Tolerating PT y   Tolerating Diet y   Could not obtain due to AMS vs intubation        Objective:     VITALS:   Last 24hrs VS reviewed since prior progress note. Most recent are:  Visit Vitals   Item Reading   ? BP 101/49   ? Pulse 61   ? Temp 98.2 F (36.8 C)   ? Resp 20   ? Ht 5' 2 (1.575 m)   ? Wt 144 lb 13.5 oz (65.7 kg)   ? SpO2 95%   ?  PF 40L/min     Intake/Output Summary (Last 24 hours) at 05/18/09 0859  Last data filed at 05/17/09 2200   Gross per 24 hour   Intake    740 ml   Output   2125 ml   Net  -1385 ml       Telemetry Reviewed:     PHYSICAL EXAM:  General: Extubated, awake ,   HEENT: Pupils unequal, R 3mm, L 2mm  Lungs:             Occasional rhonchi b/l  Heart:  Regular rhythm, tachycardic, SEM  Abdomen: Soft, Non distended, hypoactive BS,RUQ pain,  no rebound, umbilical hernia non reducible,  Extremities: No edema, pulses present, brace in left arm  Neurologic: GCS [id number], no gross deficit  Psych: Alert and awake today    Lab Data Reviewed: (see below)    Medications Reviewed: (see below)    PMH/SH reviewed - no change compared to H&P  ______________________________________________________________________      Care Plan discussed with:  Patient x   Family    RN x   Care Manager    Consultant/Specialist      >50% of visit spent in counseling and coordination of care        Prophylaxis:  GI PPI   DVT SCD     Disposition:   Home with Family    HH/PT/OT/RN    SNF/LTC x   [facility]      ______________________________________________________________________  Attending Physician: Jesus A. Chandra, MD   _____________________________________________________________________________________________________  Procedures: see electronic medical records for all procedures/Xrays and details  which were not copied into this note but were reviewed prior to creation of Plan.      LABS:  Recent Labs   Basename 05/18/09 0255 05/17/09 0255   ? WBC 6.8 7.9   ? HGB 7.8* 8.0*   ? HCT 24.9* 25.7*   ? PLT 87* 98*     Recent Labs   Basename 05/18/09 0255 05/17/09 0255 05/16/09 0415   ? NA 136 141 139   ? K 4.4 3.8 3.9   ? CL 103 111* 107   ? CO2 22 24 21    ? BUN 9 12 14    ? CREA 0.8 0.7 0.7   ? GLU 216* 86 115*   ? CA 9.0 9.0 9.2   ? MG -- -- --   ? PHOS -- -- --   ? URICA -- -- --     Recent Labs   Basename 05/18/09 0255 05/17/09 0255 05/16/09 0415   ? SGOT 24 29 25    ? GPT 28 32 35   ? AP 118 111 116   ? TBIL 1.5* 1.6* 1.8*   ? TP 6.2* 6.2* 6.3*   ? ALB 2.0* 2.0* 2.1*   ? GLOB 4.2* 4.2* 4.2*   ? GGT -- -- --   ? AML -- -- --   ? LPSE -- -- --     Recent Labs   Basename 05/17/09 0255   ? INR 1.4*   ? PTP 14.4*   ? APTT --         No results found for this basename: FE:2,TIBC:2,PSAT:2,FERR:2, in the last 72 hours     No results found for this basename: PH:2,PCO2:2,PO2:2, in the last 72 hours    No results found for this basename: CPK:3,CKMB:3,TROPONINI:3, in the last 72 hours    Lab Results   Component Value Date/Time    POC GLUCOSE 113 05/18/2009  7:20 AM    POC GLUCOSE 106 05/18/2009 12:45 AM    POC GLUCOSE 89 05/17/2009  5:04 PM    POC GLUCOSE 83 05/17/2009  7:31 AM    POC GLUCOSE 87 05/17/2009  7:29 AM     Lab Results   Component Value Date/Time    Color DARK YELLOW 05/09/2009 11:05 AM    Appearance CLEAR 05/09/2009 11:05 AM    Specific gravity 1.010 10/29/2008 11:00 AM    Specific gravity 1.024 05/09/2009 11:05 AM    pH 6.0  05/09/2009 11:05 AM    Protein TRACE 05/09/2009 11:05 AM    Glucose NEGATIVE  05/09/2009 11:05 AM    Ketone NEGATIVE  05/09/2009 11:05 AM    Bilirubin NEGATIVE  04/30/2009 12:14 AM    Urobilinogen 0.2 05/09/2009 11:05 AM    Nitrites NEGATIVE  05/09/2009 11:05 AM    Leukocyte Esterase SMALL 05/09/2009 11:05 AM    Epithelial cells 0-5 05/09/2009 11:05 AM    Bacteria NEGATIVE  05/09/2009 11:05 AM    WBC 5-10 05/09/2009 11:05 AM    RBC 10-20 05/09/2009 11:05 AM     MEDICATIONS:  Current facility-administered medications   Medication Dose Route Frequency   ? TPN ADULT - PERIPHERAL  1,512 mL IntraVENous CONTINUOUS   ? dextrose 5 % - 0.9% NaCl infusion    IntraVENous CONTINUOUS   ? DISCONTD: midazolam (VERSED) injection 0.5-10 mg  0.5-10 mg IntraVENous Multiple   ? DISCONTD: meperidine (DEMEROL) injection 12.5-50 mg  12.5-50 mg IntraVENous Multiple   ? DISCONTD: naloxone (NARCAN) injection 0.4 mg  0.4 mg IntraVENous Multiple   ? DISCONTD: flumazenil (ROMAZICON) 0.1 mg/mL injection 0.2 mg  0.2 mg IntraVENous Multiple   ? DISCONTD: benzocaine (HURRICANE) 20 % spray    Mucous Membrane ONCE   ? DISCONTD: simethicone (MYLICON) 40mg /0.57mL oral drops 80 mg  1.2 mL Oral Multiple   ? rifaximin (XIFAXAN) tablet 400 mg  400 mg Oral TID   ? aspirin chewable tablet 81 mg  81 mg Oral DAILY   ? metoprolol  (LOPRESSOR ) tablet 25 mg  25 mg Oral Q12H   ? levothyroxine (SYNTHROID) injection 25 mcg  25 mcg IntraVENous DAILY   ? DISCONTD: lactulose (CHRONULAC) solution 20 g  20 g Oral BID   ? piperacillin-tazobactam (ZOSYN) 3.375 g in 0.9% sodium chloride  (MBP/ADV) 100 mL MBP  3.375 g IntraVENous Q6H   ? albumin human 5% (BUMINATE) solution 25 g  25 g IntraVENous Q4H PRN   ? pantoprazole  (PROTONIX ) injection 40 mg  40 mg IntraVENous DAILY   ? albuterol /ipratropium (DUONEB ) neb solution   1 Dose Nebulization Q4HWA RT   ? sodium chloride  (NS) flush 10 mL  10 mL InterCATHeter Q8H   ? sodium chloride  (NS) flush 10 mL  10 mL InterCATHeter PRN   ? heparin  (porcine) pf 300 Units  300 Units InterCATHeter PRN   ? sodium chloride  (NS) flush 20 mL  20 mL InterCATHeter PRN   ? acetaminophen  (TYLENOL ) suppository 650 mg  650 mg Rectal Q6H PRN   ? albuterol  (PROVENTIL  VENTOLIN ) nebulizer solution 2.5 mg  2.5 mg Nebulization Q2H PRN   ? thiamine (B-1) 100 mg in 0.9% sodium chloride  50 mL IVPB  100 mg IntraVENous DAILY   ? saline peripheral flush 5 mL  5 mL InterCATHeter PRN       Electronically signed by Chandra Byers, MD at 05/18/2009  5:55 PM EDT

## 2009-05-18 NOTE — Progress Notes (Signed)
Pts rectal tube fell out while using the bed pan.

## 2009-05-18 NOTE — Progress Notes (Signed)
Gastroenterology Progress Note    05/18/2009    Admit Date: 04/29/2009    Subjective:     Follow up for:  1)  Encephalopathy    2) Anemia    Patient is on Regular.    Pain: Patient complains of abdominal pain no.  Bowel Movements: the rectal tube is out; there is no blood per nurese.    There is no bleeding            Current facility-administered medications   Medication Dose Route Frequency   ??? lorazepam (ATIVAN) injection 1 mg  1 mg IntraVENous Q4H PRN   ??? pneumococcal vaccine (PNU-IMMUNE) injection 0.5 mL  0.5 mL IntraMUSCular ONCE   ??? TPN ADULT - PERIPHERAL  1,512 mL IntraVENous CONTINUOUS   ??? dextrose 5 % - 0.9% NaCl infusion    IntraVENous CONTINUOUS   ??? rifaximin (XIFAXAN) tablet 400 mg  400 mg Oral TID   ??? aspirin chewable tablet 81 mg  81 mg Oral DAILY   ??? metoprolol (LOPRESSOR) tablet 25 mg  25 mg Oral Q12H   ??? DISCONTD: levothyroxine (SYNTHROID) injection 25 mcg  25 mcg IntraVENous DAILY   ??? piperacillin-tazobactam (ZOSYN) 3.375 g in 0.9% sodium chloride (MBP/ADV) 100 mL MBP  3.375 g IntraVENous Q6H   ??? albumin human 5% (BUMINATE) solution 25 g  25 g IntraVENous Q4H PRN   ??? pantoprazole (PROTONIX) injection 40 mg  40 mg IntraVENous DAILY   ??? albuterol/ipratropium (DUONEB) neb solution   1 Dose Nebulization Q4HWA RT   ??? sodium chloride (NS) flush 10 mL  10 mL InterCATHeter Q8H   ??? sodium chloride (NS) flush 10 mL  10 mL InterCATHeter PRN   ??? heparin (porcine) pf 300 Units  300 Units InterCATHeter PRN   ??? sodium chloride (NS) flush 20 mL  20 mL InterCATHeter PRN   ??? acetaminophen (TYLENOL) suppository 650 mg  650 mg Rectal Q6H PRN   ??? albuterol (PROVENTIL VENTOLIN) nebulizer solution 2.5 mg  2.5 mg Nebulization Q2H PRN   ??? thiamine (B-1) 100 mg in 0.9% sodium chloride 50 mL IVPB  100 mg IntraVENous DAILY   ??? saline peripheral flush 5 mL  5 mL InterCATHeter PRN          Objective:      Blood pressure 130/68, pulse 55, temperature 98.5 ??F (36.9 ??C), resp. rate 20, height 5\' 2"  (1.575 m), weight 144 lb 13.5 oz (65.7 kg), SpO2 99.00%, peak flow 40 L/min.         In: 940 (240 P.O. 700 I.V.)  Out: 4125 (3625 Urine 500 Drains)      Somnolent (received Ativan)    Not oriented (August)    Spleen is enlarger; bs +    Data Review  Recent Results (from the past 24 hour(s))   GLUCOSE, POC    Collection Time    05/17/09  5:04 PM   Component Value Range   ??? POC GLUCOSE 89  65 - 105 (mg/dL)   GLUCOSE, POC    Collection Time    05/18/09 12:45 AM   Component Value Range   ??? POC GLUCOSE 106 (*) 65 - 105 (mg/dL)   METABOLIC PANEL, COMPREHENSIVE    Collection Time    05/18/09  2:55 AM   Component Value Range   ??? Sodium 136  136 - 145 (MMOL/L)   ??? Potassium 4.4  3.5 - 5.1 (MMOL/L)   ??? Chloride 103  97 - 108 (MMOL/L)   ??? CO2  22  21 - 32 (MMOL/L)   ??? Anion gap 11  5 - 15 (mmol/L)   ??? Glucose 216 (*) 65 - 100 (MG/DL)   ??? BUN 9  6 - 20 (MG/DL)   ??? Creatinine 0.8  0.6 - 1.3 (MG/DL)   ??? BUN/Creatinine ratio 11 (*) 12 - 20 ( )   ??? GFR est AA >60  >60 (ml/min/1.18m2)   ??? GFR est non-AA >60  >60 (ml/min/1.24m2)   ??? Calcium 9.0  8.5 - 10.1 (MG/DL)   ??? Bilirubin, total 1.5 (*) 0.2 - 1.0 (MG/DL)   ??? ALT 28  12 - 78 (U/L)   ??? AST 24  15 - 37 (U/L)   ??? Alk. phosphatase 118  50 - 136 (U/L)   ??? Protein, total 6.2 (*) 6.4 - 8.2 (g/dL)   ??? Albumin 2.0 (*) 3.5 - 5.0 (g/dL)   ??? Globulin 4.2 (*) 2.0 - 4.0 (g/dL)   ??? A-G Ratio 0.5 (*) 1.1 - 2.2 ( )   CBC WITH AUTOMATED DIFF    Collection Time    05/18/09  2:55 AM   Component Value Range   ??? WBC 6.8  3.6 - 11.0 (K/uL)   ??? RBC 2.74 (*) 3.80 - 5.20 (M/uL)   ??? HGB   Down slightly  7.8 (*) 11.5 - 16.0 (g/dL)   ??? HCT 24.9 (*) 35.0 - 47.0 (%)   ??? MCV 90.9  80.0 - 99.0 (FL)   ??? MCH 28.5  26.0 - 34.0 (PG)   ??? MCHC 31.3  30.0 - 36.5 (g/dL)   ??? RDW 22.4 (*) 11.5 - 14.5 (%)   ??? PLATELET 87 (*) 150 - 400 (K/uL)   ??? NEUTROPHILS 70  32 - 75 (%)   ??? LYMPHOCYTES 21  12 - 49 (%)   ??? MONOCYTES 6  5 - 13 (%)    ??? EOSINOPHILS 2  0 - 7 (%)   ??? BASOPHILS 1  0 - 1 (%)   ??? ABSOLUTE NEUTS 4.8  1.8 - 8.0 (K/UL)   ??? ABSOLUTE LYMPHS 1.4  0.8 - 3.5 (K/UL)   ??? ABSOLUTE MONOS 0.4  0.0 - 1.0 (K/UL)   ??? ABSOLUTE EOSINS 0.1  0.0 - 0.4 (K/UL)   ??? ABSOLUTE BASOS 0.1  0.0 - 0.1 (K/UL)   ??? RBC COMMENTS 2+ ANISOCYTOSIS     ??? DF SMEAR SCANNED     GLUCOSE, POC    Collection Time    05/18/09  7:20 AM   Component Value Range   ??? POC GLUCOSE 113 (*) 65 - 105 (mg/dL)   GLUCOSE, POC    Collection Time    05/18/09 11:46 AM   Component Value Range   ??? POC GLUCOSE 210 (*) 65 - 105 (mg/dL)             Assessment:     Patient Active Hospital Problem List:  Nausea with vomiting (05/01/2009)    Hypokalemia (05/01/2009)    Hepatic encephalopathy (05/01/2009)    Gait abnormality (05/01/2009)    Alcoholic cirrhosis of liver (05/01/2009)    Hypomagnesemia (05/01/2009)      Plan:     1)  She needs to be listed for a transplant.  Her insurance is an issue with this  2)  Await biopsies from yesterday's procedure  3)  Percent saturation and ferritin  4)  Add xifaxin for encephalopathy  5)  Will follow  6)  ? Role for colonoscopy; will discuss with  hospitalist.      [name], MD  4:11 PM  05/18/2009

## 2009-05-18 NOTE — Progress Notes (Signed)
Attempted to treat pt but on arrival pt agitated and confused about her situation.  Pt refused to work with PT and was trying to get up on her own.  Nursing notified.  Nursing asked to hold PT till later today to see if pt is less confused and upset.

## 2009-05-18 NOTE — Progress Notes (Signed)
Spoke with PT who just attempted to see patient for treatment. Per PT patient became agitated and delusional up her attempt to work with the patient. Patients behavior escalated upon attempts to console her. Will hold OT reevaluation today and follow up tomorrow.

## 2009-05-18 NOTE — Progress Notes (Signed)
Faculty or Preceptor Review of Student Work    05/18/2009  - Shift times - *1700 to 2200    The student documentation of patient care for Breanna Lester has been reviewed and approved.  All medications have been administered under the direct supervision of the faculty or preceptor.

## 2009-05-18 NOTE — Progress Notes (Signed)
Faculty or Preceptor Review of Student Work    05/18/2009  - Shift times - 1700 to 2200    The student documentation of patient care for Breanna Lester has been reviewed and approved.  All medications have been administered under the direct supervision of the faculty or preceptor.

## 2009-05-18 NOTE — Progress Notes (Signed)
Attempted to treat pt but pt again quite agitated and crying on arrival.  She stated her daughter needs help and was just in her room.  Pt would not agree to therapy and was trying to get out of bed.   Notified nursing.  Will follow up tomorrow.

## 2009-05-18 NOTE — Progress Notes (Signed)
Hospitalist Progress Note         NAME: Breanna Lester        DOB:  May 19, 1961        MRN:  [id number]      Assessment & Plan   Sepsis syndrome :underlying pneumonia, fever, leukocytosis, resolved. off vanco and LVQ. Continue Zosyn to complete 14 day course.  ????????????????????????No evidence of UTI or ascites(SBP), has an umbilical hernia that is not     reducible but overall adbomen is soft, has diarrhea with dark stools    Altered mental status : multifactorial Sepsis, hypernatremia, ARF, hyperamonemia   Has improved.Hepatic encephalopathy (05/01/2009) on rifaximin now, GI input appreciated. Taken off lactulose because of diarrhea, still has rectal tube in place.    NSTEMI:on ASA and beta-blockers, Received  Lovenox earlier, Cardiology in the case, ischemic workup when stable either before d/c or as otpatient      Anemia, dark stools, diarrhea: egd revealed Schatzki's ring/ HH, bx pending.  S/p egd, no obvious bleed source. Hgb to 7.8, will transfuse lower.    Hypothyroidism(?) tsh suppressed < 0.01, hold levothyroxine, recheck tsh/ft4.    Gait abnormality (05/01/2009) B12, folate normal, PT/OT     Nutrition: Currently on TPN, . Advancing diet, likely stop tpn aftyer tomorrow if intake is better.    Left wrist Fracture: casted Ortho following the case    Acute respiratory failure:got extubated doing well off the ventilator    ARF: resolved    Rhabdomyolisis: resolved    Hypernatremia: resolved, now off IVF.         Subjective:     Breanna Lester:Breanna Lester is a 48 y.o.?? Caucasian female whom presents with complaint of left forearm pain, nausea and vomiting.   Patient with a hx of cirrohsis presents ambulatory to ED with cc of vomiting. Pt's relative reports nausea and vomiting "all day".?? Per family member, when pt experiences N/V secondary to cirrhosis, it usually is associated with a fever. Pt's relative reports that pt fractured her ulna and radius 2 days ago after falling and that fractures were never "set", just "wrapped". Pt's relative also notes that pt's vision has been worsening, indicating this as the reason she fell 2 days ago   We were asked to admit for work up and evaluation of the above problems.   ????    Chief Complaint: Remains pleasantly confused convinced that husband is lying in her bed as she sits at bedside in nad. Per nursing she's been "seeing things" for a couple days.    Discussed with RN events overnight.     Review of Systems:  Fever/chills N   Cough y   Sputum    y   SOB/DOE  N   Chest Pain N   Abdominal Pain N   Diarrhea N   Constipation N   Nausea/Vomiting N   Dysuria N   Tolerating PT y   Tolerating Diet y   Could not obtain due to AMS vs intubation         Objective:       VITALS:   Last 24hrs VS reviewed since prior progress note. Most recent are:  Visit Vitals   Item Reading   ??? BP 101/49   ??? Pulse 61   ??? Temp 98.2 ??F (36.8 ??C)   ??? Resp 20   ??? Ht 5\' 2"  (1.575 m)   ??? Wt 144 lb 13.5 oz (65.7 kg)   ??? SpO2 95%   ??? PF 40L/min  Intake/Output Summary (Last 24 hours) at 05/18/09 0859  Last data filed at 05/17/09 2200   Gross per 24 hour   Intake    740 ml   Output   2125 ml   Net  -1385 ml        Telemetry Reviewed:     PHYSICAL EXAM:  General: Extubated, awake ,   HEENT: Pupils unequal, R 3mm, L 2mm  Lungs:             Occasional rhonchi b/l  Heart:  Regular rhythm, tachycardic, SEM  Abdomen: Soft, Non distended, hypoactive BS,RUQ pain,  no rebound, umbilical hernia non reducible,  Extremities: No edema, pulses present, brace in left arm  Neurologic:?? GCS [id number], no gross deficit  Psych:???? Alert and awake today     Lab Data Reviewed: (see below)    Medications Reviewed: (see below)    PMH/SH reviewed - no change compared to H&P  ______________________________________________________________________        Care Plan discussed with:  Patient x   Family    RN x   Care Manager    Consultant/Specialist      >50% of visit spent in counseling and coordination of care       Prophylaxis:  GI PPI   DVT SCD     Disposition:   Home with Family    HH/PT/OT/RN    SNF/LTC x   [facility]      ______________________________________________________________________  Attending Physician: Laurianne Floresca A. Bradly Bienenstock, MD   _____________________________________________________________________________________________________  Procedures: see electronic medical records for all procedures/Xrays and details  which were not copied into this note but were reviewed prior to creation of Plan.      LABS:  Recent Labs   Basename 05/18/09 0255 05/17/09 0255   ??? WBC 6.8 7.9   ??? HGB 7.8* 8.0*   ??? HCT 24.9* 25.7*   ??? PLT 87* 98*         Recent Labs   Basename 05/18/09 0255 05/17/09 0255 05/16/09 0415   ??? NA 136 141 139   ??? K 4.4 3.8 3.9   ??? CL 103 111* 107   ??? CO2 22 24 21    ??? BUN 9 12 14    ??? CREA 0.8 0.7 0.7   ??? GLU 216* 86 115*   ??? CA 9.0 9.0 9.2   ??? MG -- -- --   ??? PHOS -- -- --   ??? URICA -- -- --         Recent Labs   Basename 05/18/09 0255 05/17/09 0255 05/16/09 0415   ??? SGOT 24 29 25    ??? GPT 28 32 35   ??? AP 118 111 116   ??? TBIL 1.5* 1.6* 1.8*   ??? TP 6.2* 6.2* 6.3*   ??? ALB 2.0* 2.0* 2.1*   ??? GLOB 4.2* 4.2* 4.2*   ??? GGT -- -- --   ??? AML -- -- --   ??? LPSE -- -- --         Recent Labs   Basename 05/17/09 0255   ??? INR 1.4*   ??? PTP 14.4*   ??? APTT --          No results found for this basename: FE:2,TIBC:2,PSAT:2,FERR:2, in the last 72 hours     No results found for this basename: PH:2,PCO2:2,PO2:2, in the last 72 hours      No results found for this basename: CPK:3,CKMB:3,TROPONINI:3, in the last 72 hours    Lab Results  Component Value Date/Time    POC GLUCOSE 113 05/18/2009  7:20 AM     POC GLUCOSE 106 05/18/2009 12:45 AM    POC GLUCOSE 89 05/17/2009  5:04 PM    POC GLUCOSE 83 05/17/2009  7:31 AM    POC GLUCOSE 87 05/17/2009  7:29 AM         Lab Results   Component Value Date/Time    Color DARK YELLOW 05/09/2009 11:05 AM    Appearance CLEAR 05/09/2009 11:05 AM    Specific gravity 1.010 10/29/2008 11:00 AM    Specific gravity 1.024 05/09/2009 11:05 AM    pH 6.0 05/09/2009 11:05 AM    Protein TRACE 05/09/2009 11:05 AM    Glucose NEGATIVE  05/09/2009 11:05 AM    Ketone NEGATIVE  05/09/2009 11:05 AM    Bilirubin NEGATIVE  04/30/2009 12:14 AM    Urobilinogen 0.2 05/09/2009 11:05 AM    Nitrites NEGATIVE  05/09/2009 11:05 AM    Leukocyte Esterase SMALL 05/09/2009 11:05 AM    Epithelial cells 0-5 05/09/2009 11:05 AM    Bacteria NEGATIVE  05/09/2009 11:05 AM    WBC 5-10 05/09/2009 11:05 AM    RBC 10-20 05/09/2009 11:05 AM           MEDICATIONS:  Current facility-administered medications   Medication Dose Route Frequency   ??? TPN ADULT - PERIPHERAL  1,512 mL IntraVENous CONTINUOUS   ??? dextrose 5 % - 0.9% NaCl infusion    IntraVENous CONTINUOUS   ??? DISCONTD: midazolam (VERSED) injection 0.5-10 mg  0.5-10 mg IntraVENous Multiple   ??? DISCONTD: meperidine (DEMEROL) injection 12.5-50 mg  12.5-50 mg IntraVENous Multiple   ??? DISCONTD: naloxone (NARCAN) injection 0.4 mg  0.4 mg IntraVENous Multiple   ??? DISCONTD: flumazenil (ROMAZICON) 0.1 mg/mL injection 0.2 mg  0.2 mg IntraVENous Multiple   ??? DISCONTD: benzocaine (HURRICANE) 20 % spray    Mucous Membrane ONCE   ??? DISCONTD: simethicone (MYLICON) 40mg /0.45mL oral drops 80 mg  1.2 mL Oral Multiple   ??? rifaximin (XIFAXAN) tablet 400 mg  400 mg Oral TID   ??? aspirin chewable tablet 81 mg  81 mg Oral DAILY   ??? metoprolol (LOPRESSOR) tablet 25 mg  25 mg Oral Q12H   ??? levothyroxine (SYNTHROID) injection 25 mcg  25 mcg IntraVENous DAILY   ??? DISCONTD: lactulose (CHRONULAC) solution 20 g  20 g Oral BID    ??? piperacillin-tazobactam (ZOSYN) 3.375 g in 0.9% sodium chloride (MBP/ADV) 100 mL MBP  3.375 g IntraVENous Q6H   ??? albumin human 5% (BUMINATE) solution 25 g  25 g IntraVENous Q4H PRN   ??? pantoprazole (PROTONIX) injection 40 mg  40 mg IntraVENous DAILY   ??? albuterol/ipratropium (DUONEB) neb solution   1 Dose Nebulization Q4HWA RT   ??? sodium chloride (NS) flush 10 mL  10 mL InterCATHeter Q8H   ??? sodium chloride (NS) flush 10 mL  10 mL InterCATHeter PRN   ??? heparin (porcine) pf 300 Units  300 Units InterCATHeter PRN   ??? sodium chloride (NS) flush 20 mL  20 mL InterCATHeter PRN   ??? acetaminophen (TYLENOL) suppository 650 mg  650 mg Rectal Q6H PRN   ??? albuterol (PROVENTIL VENTOLIN) nebulizer solution 2.5 mg  2.5 mg Nebulization Q2H PRN   ??? thiamine (B-1) 100 mg in 0.9% sodium chloride 50 mL IVPB  100 mg IntraVENous DAILY   ??? saline peripheral flush 5 mL  5 mL InterCATHeter PRN

## 2009-05-19 LAB — METABOLIC PANEL, BASIC
Anion gap: 7 mmol/L (ref 5–15)
BUN/Creatinine ratio: 14 (ref 12–20)
BUN: 10 MG/DL (ref 6–20)
CO2: 26 MMOL/L (ref 21–32)
Calcium: 8.9 MG/DL (ref 8.5–10.1)
Chloride: 107 MMOL/L (ref 97–108)
Creatinine: 0.7 MG/DL (ref 0.6–1.3)
GFR est AA: >60 mL/min/{1.73_m2} (ref >60)
GFR est non-AA: >60 mL/min/{1.73_m2} (ref >60)
Glucose: 107 MG/DL — ABNORMAL HIGH (ref 65–100)
Potassium: 3.9 MMOL/L (ref 3.5–5.1)
Sodium: 140 MMOL/L (ref 136–145)

## 2009-05-19 LAB — CBC WITH AUTOMATED DIFF
ABS. BASOPHILS: 0.1 10*3/uL (ref 0.0–0.1)
ABS. EOSINOPHILS: 0.1 10*3/uL (ref 0.0–0.4)
ABS. LYMPHOCYTES: 1.3 10*3/uL (ref 0.8–3.5)
ABS. MONOCYTES: 0.4 10*3/uL (ref 0.0–1.0)
ABS. NEUTROPHILS: 3.3 10*3/uL (ref 1.8–8.0)
BASOPHILS: 1 % (ref 0–1)
EOSINOPHILS: 1 % (ref 0–7)
HCT: 26 % — ABNORMAL LOW (ref 35.0–47.0)
HGB: 8.2 g/dL — ABNORMAL LOW (ref 11.5–16.0)
LYMPHOCYTES: 25 % (ref 12–49)
MCH: 28.6 PG (ref 26.0–34.0)
MCHC: 31.5 g/dL (ref 30.0–36.5)
MCV: 90.6 FL (ref 80.0–99.0)
MONOCYTES: 7 % (ref 5–13)
NEUTROPHILS: 66 % (ref 32–75)
PLATELET: 87 10*3/uL — ABNORMAL LOW (ref 150–400)
RBC: 2.87 M/uL — ABNORMAL LOW (ref 3.80–5.20)
RDW: 21.5 % — ABNORMAL HIGH (ref 11.5–14.5)
WBC: 5.2 10*3/uL (ref 3.6–11.0)

## 2009-05-19 LAB — TYPE + CROSSMATCH
ABO/Rh(D): A POS
Antibody screen: NEGATIVE
Unit division: 0
Unit division: 0

## 2009-05-19 LAB — GLUCOSE, POC
Glucose (POC): 119 mg/dL — ABNORMAL HIGH (ref 65–105)
Glucose (POC): 137 mg/dL — ABNORMAL HIGH (ref 65–105)
Glucose (POC): 130 mg/dL — ABNORMAL HIGH (ref 65–105)
Glucose (POC): 102 mg/dL (ref 65–105)

## 2009-05-19 MED ADMIN — piperacillin-tazobactam (ZOSYN) 3.375 g in 0.9% sodium chloride (MBP/ADV) 100 mL MBP: INTRAVENOUS | @ 04:00:00 | NDC 00338055318

## 2009-05-19 MED ADMIN — rifaximin (XIFAXAN) tablet 400 mg: ORAL | @ 02:00:00 | NDC 65649030105

## 2009-05-19 MED ADMIN — dextrose 5 % - 0.9% NaCl infusion: INTRAVENOUS | @ 03:00:00 | NDC 00409794109

## 2009-05-19 MED ADMIN — metoprolol (LOPRESSOR) tablet 25 mg: ORAL | @ 02:00:00 | NDC 51079025501

## 2009-05-19 MED ADMIN — aspirin chewable tablet 81 mg: ORAL | @ 14:00:00

## 2009-05-19 MED ADMIN — rifaximin (XIFAXAN) tablet 400 mg: ORAL | @ 14:00:00 | NDC 65649030105

## 2009-05-19 MED ADMIN — thiamine (B-1) 100 mg in 0.9% sodium chloride 50 mL IVPB: INTRAVENOUS | @ 14:00:00 | NDC 63323001302

## 2009-05-19 MED ADMIN — piperacillin-tazobactam (ZOSYN) 3.375 g in 0.9% sodium chloride (MBP/ADV) 100 mL MBP: INTRAVENOUS | @ 17:00:00 | NDC 00338055318

## 2009-05-19 MED ADMIN — sodium chloride (NS) flush 10 mL: @ 21:00:00 | NDC 87701099893

## 2009-05-19 MED ADMIN — albuterol/ipratropium (DUONEB) neb solution: RESPIRATORY_TRACT | @ 15:00:00 | NDC 00487980101

## 2009-05-19 MED ADMIN — albuterol/ipratropium (DUONEB) neb solution: RESPIRATORY_TRACT | @ 11:00:00 | NDC 00487980101

## 2009-05-19 MED ADMIN — metoprolol (LOPRESSOR) tablet 25 mg: ORAL | @ 14:00:00 | NDC 51079025501

## 2009-05-19 MED ADMIN — pantoprazole (PROTONIX) tablet 40 mg: ORAL | @ 14:00:00 | NDC 00008084181

## 2009-05-19 MED ADMIN — rifaximin (XIFAXAN) tablet 400 mg: ORAL | @ 21:00:00 | NDC 65649030105

## 2009-05-19 MED ADMIN — piperacillin-tazobactam (ZOSYN) 3.375 g in 0.9% sodium chloride (MBP/ADV) 100 mL MBP: INTRAVENOUS | @ 10:00:00 | NDC 00338055318

## 2009-05-19 MED ADMIN — albuterol/ipratropium (DUONEB) neb solution: RESPIRATORY_TRACT | @ 19:00:00 | NDC 00487980101

## 2009-05-19 MED ADMIN — piperacillin-tazobactam (ZOSYN) 3.375 g in 0.9% sodium chloride (MBP/ADV) 100 mL MBP: INTRAVENOUS | @ 22:00:00 | NDC 00338055318

## 2009-05-19 MED ADMIN — sodium chloride (NS) flush 10 mL: @ 02:00:00 | NDC 87701099893

## 2009-05-19 MED ADMIN — sodium chloride (NS) flush 10 mL: @ 10:00:00 | NDC 87701099893

## 2009-05-19 MED FILL — SALINE FLUSH INJECTION SYRINGE: INTRAMUSCULAR | Qty: 30

## 2009-05-19 MED FILL — PROTONIX 40 MG TABLET,DELAYED RELEASE: 40 mg | ORAL | Qty: 1

## 2009-05-19 MED FILL — ZOSYN 3.375 GRAM INTRAVENOUS SOLUTION: 3.375 gram | INTRAVENOUS | Qty: 3.38

## 2009-05-19 MED FILL — SALINE FLUSH INJECTION SYRINGE: INTRAMUSCULAR | Qty: 10

## 2009-05-19 MED FILL — METOPROLOL TARTRATE 25 MG TAB: 25 mg | ORAL | Qty: 1

## 2009-05-19 MED FILL — THIAMINE 100 MG/ML INJECTION: 100 mg/mL | INTRAMUSCULAR | Qty: 1

## 2009-05-19 MED FILL — ALBUTEROL SULFATE 0.083 % (0.83 MG/ML) SOLN FOR INHALATION: 2.5 mg /3 mL (0.083 %) | RESPIRATORY_TRACT | Qty: 1

## 2009-05-19 MED FILL — XIFAXAN 200 MG TABLET: 200 mg | ORAL | Qty: 2

## 2009-05-19 MED FILL — ASPIRIN 81 MG CHEWABLE TAB: 81 mg | ORAL | Qty: 1

## 2009-05-19 NOTE — Progress Notes (Signed)
 Formatting of this note might be different from the original.  Responded to staff request to visit patient who was requesting pastoral care.  Offered empathetic listening and support to pt who described conflicted family situations.  Patient was tearful and visibly upset. Pt appeared confused at times.  Offered assurance to patient and prayer.  Discussed coping skills with patient.  Prayed with patient. Pt appeared calmer and thanked chaplain for coming.  Pastoral care will continue to follow patient.    Visit by: Rev. Jennifer L. Dockum, D.Min, MA, PRN Chaplain  Electronically signed by Chanda Delon CROME at 05/19/2009 11:05 PM EDT

## 2009-05-19 NOTE — Progress Notes (Signed)
 Formatting of this note is different from the original.  Problem: Self Care Deficits Care Plan (Adult)  Goal: *Acute Goals and Plan of Care (Insert Text)  Occupational Therapy Goals reviewed and revised ( as needed) as per 9/29 reevaluation.  1. Pt will complete grooming in supported position with minimal assistance within 7 days.  2. Pt will increase fine motor skills to open task containers (toothpaste, lotion, ect) with set up assist within 7 days.  3. Pt. Will perform SPT to/from [facility] with min assist within 7 days. (established 9/29)  4. Pt will tolerated x 10 min edge of bed with minimal assistance while completing dynamic balance activity within 7 days.  5. Pt will move from supine to sit with minimal assistance within 7 days, in preparation for functional mobility.   OCCUPATIONAL THERAPY TREATMENT : WEEKLY REASSESSMENT    NAME: Breanna Lester AGE: 48 y.o.  GENDER: female  DATE: 05/19/2009  PRIMARY DIAGNOSIS: AMS, hypokalemia, lft upper extremity pain  AMS, hypokalemia, lft upper extremity pain  AMS, hypokalemia, lft upper extremity pain  Colonoscopy  <principal problem not specified>  Procedure(s) (LRB):  ESOPHAGOGASTRODUODENOSCOPY (EGD) (N/A)  ESOPHAGOGASTRODUODENAL (EGD) BIOPSY (N/A)  2 Days Post-Op     Precautions: Falls, NWB on LLE.  SUBJECTIVE:   Patient stated ? I'm scared to stand.    OBJECTIVE DATA SUMMARY:   Cognitive/Behavioral Status:  Level of Consciousness: Alert;Confused  Orientation Level: Oriented to person;Oriented to place;Oriented to situation  Cognition: Decreased attention/concentration;Follows commands;Impaired decision making;Impulsive;Poor safety awareness  Safety/Judgement: Decreased awareness of need for safety  Functional Mobility and Transfers for ADLs:  Bed Mobility:  Overall level of assistance required: Presented up in sitting EOB with PTA  Transfers:  Sit to Stand: Assist X2;Minimum assistance;Moderate assistance   Bed to Chair: Maximum assistance;Assist X2 stand pivot after  toileting /toilet transfers.  Assistance needed for weight shifting to clear feet when attempting to take steps.                Toilet Transfer : Moderate assistance (of 1 and min A of another stand pivot to [facility])    Balance:  Sitting: Impaired  Sitting - Static: Good (unsupported)  Sitting - Dynamic:  (poor progressing to fair-patient min assist for dynamic bala)  Standing: Impaired  Standing - Static: Constant support;Poor  Standing - Dynamic : Impaired (poor)  ADL Intervention:    Toileting  Toileting Assistance: Supervision/set up (for bowel hygiene performed in sitting. )  Clothing Management:  (Total assist)     Cognitive Retraining  Problem Solving: Awareness of environment;Deductive reason;Identifying the problem (during functional transfers)  Organizing/Sequencing: Breaking task down (for sit to stand & SPT to/from [facility] with walker.)  Attention to Task: Distractibility  Following Commands: Follows two step commands/directions  Safety/Judgement: Decreased awareness of need for safety  Cues: Tactile cues provided;Verbal cues provided;Visual cues provided     After treatment:  [X]    Patient left in NAD  [X]    Call bell left within reach  [ ]    Caregiver present  [X]    Patient left up in chair, nursing staff notified    ASSESSMENT AND PROGRESSION TOWARD GOALS:   Patient alert and overall cooperative today. Still some what confused and impulsive limiting her safety during transfers. Patient is slowly progressing, but remains limited by her decreased activity tolerance, GW, impaired cognition, decrease balance and decreased safety awareness which is impairing her functional independence. Patient continues to benefit from OT services.  Patient?s progression toward goals since last  assessment: Goals remain appropriate. New goal for [facility] transfers added to care plan    PLAN OF CARE:   Goals have been updated based on progression since last assessment.  Patient continues to benefit from skilled intervention to address  the above impairments.  Continue to follow patient 3-5 times a week  to address goals.  Planned Interventions:  [X]    Self Care Training                    [X]     Therapeutic Activities  [X]    Functional Mobility Training     [X]     Cognitive Retraining  [X]    Therapeutic Exercises              [X]     Endurance Activities  [X]    Balance Training                       [X]     Neuromuscular Re-education  [X]    Patient Education                      [X]     Family Training/Education  [ ]    Visual/Perceptual Training         [X]     Home safety training  [ ]    Other (Comment):  Discharge Recommendations:  [ ]    To be determined                                   [ ]     Day rehabilitation program  [ ]    Home without services  (home safety education provided)  [ ]    Home with Home Health services  (home safety education provided)  [ ]   Outpatient therapy                                  [X]     Nursing facility for rehab  VS.  [X]    Inpatient Rehab Hospital                 [ ]     Nursing facility for long term care  [ ]    Other:  Further Equipment Recommendations for Discharge: TBD  Communication/Collaboration:  The patient?s plan of care was discussed with:  Physical Therapy Assistant    Elouise SHAUNNA Putnam, OTR/L  Time Calculation: 20 mins          Electronically signed by Putnam Elouise SHAUNNA, OTR/L at 05/19/2009  4:13 PM EDT

## 2009-05-19 NOTE — Progress Notes (Signed)
 Formatting of this note is different from the original.  Problem: Mobility Impaired (Adult)  Goal: *Acute Goals and Plan of Care (Insert Text)  Physical Therapy Goals  Initiated 05/12/2009  1. Patient will move from supine to sit and sit to supine , scoot up and down and roll side to side in bed with moderate assistance within 7 day(s).   2. Patient will transfer from bed to chair and chair to bed with moderate assistance using the least restrictive device within 7 day(s).  3. Patient will perform sit to stand with moderate assistance within 7 day(s).  4. Patient will be able to sit at EOB x 5 minutes while maintaining midline with minimum assist within 7 days.   5. Patient will perform 1 sets of 15 repetitions of active strengthening exercises for bilateral upper and lower extremity(s) with supervision/set-up within 7 day(s).     PHYSICAL THERAPY TREATMENT    NAME: Breanna Lester AGE: 48 y.o.  GENDER: female  DATE: 05/19/2009  PRIMARY DIAGNOSIS: AMS, hypokalemia, lft upper extremity pain  AMS, hypokalemia, lft upper extremity pain  AMS, hypokalemia, lft upper extremity pain  Colonoscopy  <principal problem not specified>  Procedure(s) (LRB):  ESOPHAGOGASTRODUODENOSCOPY (EGD) (N/A)  ESOPHAGOGASTRODUODENAL (EGD) BIOPSY (N/A)  2 Days Post-Op    Precautions: Fall, Contact  Chart , physical therapy assessment, plan of care and goals  w ere  reviewed.    SUBJECTIVE:   Patient stated ? I will try to clean myself .?    OBJECTIVE DATA SUMMARY:   Critical Behavior:  Level of Consciousness: Alert  Orientation Level: Oriented to place;Oriented to situation;Oriented to person  Cognition: Decreased attention/concentration;Follows commands;Impaired decision making;Memory loss;Poor safety awareness  Safety/Judgement: Decreased awareness of need for assistance;Decreased awareness of need for safety  Functional Mobility Training:  Bed Mobility:  Rolling: Additional time;Minimal  assistance;Verbal cues  to avoid using LUE  Supine to Sit:  Additional time;Minimal assistance;Verbal cues  to push up through arm  Transfers:  Sit to Stand: Assist X2;Minimum assistance;Moderate assistance ( verbal cues for hand [to not push with L wrist] and foot placement)  Difficulty getting pelvis/trunk over BOS; Unable to attain balance in standing with support  Balance:  Sitting: Impaired  Sitting - Static: Fair   Able to sit at EOB with CGA to SBA today.  Sitting - Dynamic:  (poor progressing to fair)  Standing - Static: Constant support;Poor (leans posteriorly and occasionally to the left)  Standing - Dynamic : Impaired    Pain: No c/o pain today    Activity Tolerance: Per nursing  Vitals Assessment 1:  Patient Position: Prone  BP: 128/65 mmHg  Pulse (Heart Rate): 65   O2 Sat (%): 93 %  O2 Device: Room air          After treatment:  [X]       Patient left in no apparent distress  [X]       Call bell left within reach  [ ]       Caregiver present  [X]       Patient left up in chair  with bed alarm on , nursing staff notified    ASSESSMENT AND PROGRESSION TOWARD GOALS:   Pt able to hold trunk upright at EOB with several small LOB that pt was able to recover independently.  Still requires assist with standing and maintaining standing due to difficulty getting body over BOS.  Pt agreeable and motivated to participate in therapy with less confusion today.  Pt has decreased safety awareness  and requires periodical reminders of NWB through left wrist. Pt has deficits in balance, strength and functional mobility that will continue to benefit from therapy.    Patient?s progression toward goals is as follows:  [ ]       Improving appropriately and progressing toward goals  [X]       Improving slowly and progressing toward goals  [ ]       Not making progress toward goals and plan of care will be adjusted    PLAN OF CARE:   Patient continues to benefit from skilled intervention to address the above impairments.  Continue treatment per established plan of care.  Planned  Interventions:  [X]       Bed Mobility Training            [ ]       Neuromuscular Re-education  [X]       Transfer Training                    [ ]       Orthotic/Prosthetic Training  [X]       Gait Training                           [ ]       Modalities  [X]      Therapeutic Exercises           [ ]       Edema Management/Control  [X]       Therapeutic Activities            [X]       Patient and Family Training/Education  [ ]       Other (comment):  Discharge Recommendations:  [ ]       To be determined                    [ ]       Day rehabilitation program  [ ]       Home without services            [ ]       Outpatient therapy  [ ]       Home with Home Health services      [X]       Nursing facility for rehab  [ ]       Inpatient Rehab Hospital         [ ]       Nursing facility for long term care  [ ]       Other:  Further Equipment Recommendations for Discharge: TBD  Communication/Collaboration:  The patient?s plan of care was discussed with:  Occupational Therapist, Registered Nurse and Rehabilitation Attendant    Haven A. Mason  Time Calculation: 20 mins      Electronically signed by Jonette Shadow A at 05/19/2009  3:24 PM EDT

## 2009-05-19 NOTE — Progress Notes (Signed)
 Formatting of this note is different from the original.      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    Pain: Patient complains of abdominal pain no.   Bowel Movements: Normal    There is no bleeding    GI note    Breanna Lester is doing well from a GI standpoint    She is eating well.  She is alert and oriented.  She has not abdominal pain.    Current facility-administered medications   Medication Dose Route Frequency   ? lorazepam (ATIVAN) injection 1 mg  1 mg IntraVENous Q4H PRN   ? pneumococcal vaccine (PNU-IMMUNE) injection 0.5 mL  0.5 mL IntraMUSCular ONCE   ? TPN ADULT - PERIPHERAL   1,512 mL IntraVENous CONTINUOUS   ? pantoprazole  (PROTONIX ) tablet 40 mg  40 mg Oral ACB   ? dextrose 5 % - 0.9% NaCl infusion    IntraVENous CONTINUOUS   ? DISCONTD: TPN ADULT - PERIPHERAL  1,512 mL IntraVENous CONTINUOUS   ? rifaximin (XIFAXAN) tablet 400 mg  400 mg Oral TID   ? aspirin chewable tablet 81 mg  81 mg Oral DAILY   ? metoprolol  (LOPRESSOR ) tablet 25 mg  25 mg Oral Q12H   ? piperacillin-tazobactam (ZOSYN) 3.375 g in 0.9% sodium chloride  (MBP/ADV) 100 mL MBP  3.375 g IntraVENous Q6H   ? albumin human 5% (BUMINATE) solution 25 g  25 g IntraVENous Q4H PRN   ? albuterol /ipratropium (DUONEB ) neb solution   1 Dose Nebulization Q4HWA RT   ? sodium chloride  (NS) flush 10 mL  10 mL InterCATHeter Q8H   ? sodium chloride  (NS) flush 10 mL  10 mL InterCATHeter PRN   ? heparin (porcine) pf 300 Units  300 Units InterCATHeter PRN   ? sodium chloride  (NS) flush 20 mL  20 mL InterCATHeter PRN   ? DISCONTD: pantoprazole  (PROTONIX ) injection 40 mg  40 mg IntraVENous DAILY   ? acetaminophen  (TYLENOL ) suppository 650 mg  650 mg Rectal Q6H PRN   ? albuterol  (PROVENTIL  VENTOLIN ) nebulizer solution 2.5 mg  2.5 mg Nebulization Q2H PRN   ? thiamine (B-1) 100 mg in 0.9% sodium chloride  50 mL IVPB  100 mg  IntraVENous DAILY   ? saline peripheral flush 5 mL  5 mL InterCATHeter PRN         Objective:     Blood pressure 160/70, pulse 66, temperature 98.6 F (37 C), resp. rate 18, height 5' 2 (1.575 m), weight 144 lb 13.5 oz (65.7 kg), SpO2 94.00%, peak flow 40 L/min.    In: 100 (100 I.V.)  Out: 400 (400 Urine)      In: 1882.6 (240 P.O. 1642.6 I.V.)  Out: 1325 (1325 Urine)      Alert, oriented, no distress    Data Review  Recent Results (from the past 24 hour(s))   GLUCOSE, POC    Collection Time    05/18/09  6:22 PM   Component Value Range   ? POC GLUCOSE 105  65 - 105 (mg/dL)   GLUCOSE, POC    Collection Time    05/19/09 12:12 AM   Component Value Range   ? POC GLUCOSE 119 (*) 65 - 105 (mg/dL)   METABOLIC PANEL, BASIC    Collection Time    05/19/09  3:55 AM   Component Value Range   ? Sodium 140  136 - 145 (MMOL/L)   ? Potassium 3.9  3.5 - 5.1 (MMOL/L)   ? Chloride 107  97 - 108 (MMOL/L)   ? CO2 26  21 - 32 (MMOL/L)   ? Anion gap 7  5 - 15 (mmol/L)   ? Glucose 107 (*) 65 - 100 (MG/DL)   ? BUN 10  6 - 20 (MG/DL)   ? Creatinine 0.7  0.6 - 1.3 (MG/DL)   ? BUN/Creatinine ratio 14  12 - 20 ( )   ? GFR est AA >60  >60 (ml/min/1.21m2)   ? GFR est non-AA >60  >60 (ml/min/1.66m2)   ? Calcium 8.9  8.5 - 10.1 (MG/DL)   CBC WITH AUTOMATED DIFF    Collection Time    05/19/09  3:55 AM   Component Value Range   ? WBC 5.2  3.6 - 11.0 (K/uL)   ? RBC 2.87 (*) 3.80 - 5.20 (M/uL)   ? HGB 8.2 (*) 11.5 - 16.0 (g/dL)   ? HCT 26.0 (*) 35.0 - 47.0 (%)   ? MCV 90.6  80.0 - 99.0 (FL)   ? MCH 28.6  26.0 - 34.0 (PG)   ? MCHC 31.5  30.0 - 36.5 (g/dL)   ? RDW 21.5 (*) 11.5 - 14.5 (%)   ? PLATELET 87 (*) 150 - 400 (K/uL)   ? NEUTROPHILS 66  32 - 75 (%)   ? LYMPHOCYTES 25  12 - 49 (%)   ? MONOCYTES 7  5 - 13 (%)   ? EOSINOPHILS 1  0 - 7 (%)   ? BASOPHILS 1  0 - 1 (%)   ? ABSOLUTE NEUTS 3.3  1.8 - 8.0 (K/UL)   ? ABSOLUTE LYMPHS 1.3  0.8 - 3.5 (K/UL)   ? ABSOLUTE MONOS 0.4  0.0 - 1.0 (K/UL)   ? ABSOLUTE EOSINS 0.1  0.0 - 0.4 (K/UL)   ? ABSOLUTE BASOS 0.1   0.0 - 0.1 (K/UL)   ? DF SMEAR SCANNED     ? RBC COMMENTS 1+ ANISOCYTOSIS     GLUCOSE, POC    Collection Time    05/19/09  5:49 AM   Component Value Range   ? POC GLUCOSE 137 (*) 65 - 105 (mg/dL)   GLUCOSE, POC    Collection Time    05/19/09 11:29 AM   Component Value Range   ? POC GLUCOSE 130 (*) 65 - 105 (mg/dL)     EGD biopsies:  Duodenum, biopsy:  Normal small intestinal mucosa.  2. Stomach, biopsy:  Benign fundic and antral mucosa with mild reactive gastropathy.  Giemsa stain for Helicobacter pylori is negative.  3. Mid/distal esophagus, biopsy:  Benign squamous mucosa with features suggestive of reflux  esophagitis.  No evidence of eosinophilic esophagitis or Barrett's.    srt/05/18/2009      Assessment:     Patient Active Hospital Problem List:  Nausea with vomiting (05/01/2009)    Hypokalemia (05/01/2009)    Hepatic encephalopathy (05/01/2009)    Gait abnormality (05/01/2009)     cirrhosis of liver (05/01/2009)    Hypomagnesemia (05/01/2009)    Plan:     Supportive care as you are doing    I will follow    Breanna PHEBE Angle, MD  5:13 PM  05/19/2009      Electronically signed by Lester Breanna FALCON, MD at 05/19/2009  5:13 PM EDT

## 2009-05-19 NOTE — Progress Notes (Signed)
 Formatting of this note is different from the original.  Problem: Dysphagia (Adult)  Goal: *Acute Goals and Plan of Care (Insert Text)  Speech Therapy Goals  Initiated 05/17/09  1. Patient will tolerate mechanical soft with thin liquids without overt s/s of aspiration within 7 days  2. Patient will tolerate trials of solids without overt s/s of aspiration within 7 days  3. Patient will participate in Integrated Language evaluation within 7 days MET 05/19/09    Initiated 05/10/09; re-evaluated 05/17/09  1. Patient will participate in re-evaluation of swallow within 7 days MET  2. Patient will tolerate least restrictive diet without signs or symptoms of aspiration within 7 days NOT MET  3. Patient will participate in MBS as needed/appropriate within 7 days NOT MET; not needed   SPEECH LANGUAGE PATHOLOGY DYSPHAGIA TREATMENT    NAME : Breanna Lester AGE: 48 y.o.  GENDER: female  DATE: 05/19/2009  PRIMARY DIAGNOSIS: AMS, hypokalemia, lft upper extremity pain  AMS, hypokalemia, lft upper extremity pain  AMS, hypokalemia, lft upper extremity pain  Colonoscopy  <principal problem not specified>  Procedure(s) (LRB):  ESOPHAGOGASTRODUODENOSCOPY (EGD) (N/A)  ESOPHAGOGASTRODUODENAL (EGD) BIOPSY (N/A)  2 Days Post-Op     SUBJECTIVE:   Patient hallucinating husband and his girlfriend are in the room. I am so thirsty!    OBJECTIVE:   Cognitive and Communication Status:  Level of Consciousness: Alert  Orientation Level: Oriented to place;Oriented to situation;Oriented to person  Cognition: Decreased attention/concentration;Follows commands;Impaired decision making;Memory loss;Poor safety awareness  Perception: Appears intact  Perseveration: No perseveration noted  Safety/Judgement: Decreased awareness of need for assistance;Decreased awareness of need for safety    Dysphagia Treatment:  Oral Assessment:  Oral Assessment  Labial: No impairment  Dentition: Extractions;Intact  Oral Hygiene: Dry oral mucosa  Lingual: No  impairment  Velum: No impairment    P.O. Trials:  Patient Position: Upright in bed    The patient was given the following:  Consistency Presented: Solid;Thin liquid  How Presented: Self-fed/presented;SLP-fed/presented;Cup/gulp;Spoon;Successive swallows;Straw    ORAL PHASE:  Bolus Acceptance: No impairment  Bolus Formation/Control: No impairment  Propulsion: No impairment  Oral Residue: Lingual (with cracker only)    PHARYNGEAL PHASE:  Initiation of Swallow: No impairment  Laryngeal Elevation: Functional  Aspiration Signs/Symptoms: Strong cough (after solid cracker; suspected secondary to solid cracker re sidue )  Vocal Quality: No impairment  Cues for Modifications: Minimal  Effective Modifications: Small sips and bites  After treatment:  [X]    Patient left in NAD  [X]    Call bell left within reach  [ ]    Caregiver present  [ ]    Patient left up in chair, nursing staff notified    ASSESSMENT AND PROGRESSION TOWARD GOALS:   Patient tolerating current diet without difficulty. S/S of aspiration noted only when patient took large gulp of water  after solid cracker; suspect secondary to oral residue. Subsequent swallows without s/s of aspiration. Will upgrade diet in next 1-2 days as cognition (hopefully) improves. Patient continues to need 1:1 assistance.    Patient?s progression toward goals is as follows:  [X]    Improving appropriately and progressing toward goals  [ ]    Improving slowly and progressing toward goals  [ ]    Not making progress toward goals and plan of care will be adjusted    PLAN OF CARE:   Patient continues to benefit from skilled intervention to address the above impairments.    Recommendations and Planned Interventions:  dysphagia tx    Any new recommendations  or planned interventions including recommended diet changes were discussed with:  [X]   Patient   [ ]    Family      [X]    Nursing  [ ]    Provider  [ ]   Posted safety precautions in patient?s room    Communication/Collaboration:  The patient?s  plan of care was discussed with:  Physical Therapy Assistant and Registered Nurse    Eleanor DELENA Dunker, SLP    Time Calculation: 25 mins          Electronically signed by Dunker Eleanor DELENA, SLP at 05/19/2009  1:04 PM EDT

## 2009-05-19 NOTE — Progress Notes (Signed)
 Formatting of this note is different from the original.       Hospitalist Progress Note         NAME: Breanna Lester        DOB:  10-Apr-1961        MRN:  [id number]      Assessment & Plan   Sepsis syndrome :underlying pneumonia, fever, leukocytosis, resolved. off vanco and LVQ. Continue Zosyn to complete 14 day course.  No evidence of UTI or ascites(SBP), has an umbilical hernia that is not     reducible but overall adbomen is soft, has diarrhea with dark stools    Altered mental status : multifactorial Sepsis, hypernatremia, ARF, hyperamonemia   Has improved.Hepatic encephalopathy (05/01/2009) on rifaximin now, GI input appreciated. Taken off lactulose because of diarrhea, still has rectal tube in place.    NSTEMI:on ASA and beta-blockers, Received  Lovenox  earlier, Cardiology in the case, ischemic workup when stable either before d/c or as otpatient    Anemia, dark stools, diarrhea: egd revealed Schatzki's ring/ HH, bx pending.  S/p egd, no obvious bleed source. Hgb to 7.8, will transfuse lower.    Hypothyroidism(?) tsh suppressed < 0.01, hold levothyroxine, recheck tsh remains suppressed but Ft4 upper limit nl, stopped levothyroxine 9/28.    Gait abnormality (05/01/2009) B12, folate normal, PT/OT     Nutrition: Currently on TPN, . Advancing diet, likely stop tpn aftyer tomorrow if intake is better. D/c tpn after current supply complete.    Left wrist Fracture: casted Ortho following the case    Acute respiratory failure:got extubated doing well off the ventilator    ARF: resolved    Rhabdomyolisis: resolved    Hypernatremia: resolved, now off IVF.    Dispo: snf      Subjective:     [name] is a 48 y.o. Caucasian female whom presents with complaint of left forearm pain, nausea and vomiting.  Patient with a hx of cirrohsis presents ambulatory to ED with cc of vomiting. Pt's relative reports nausea and vomiting all day. Per family member, when pt experiences N/V secondary to cirrhosis, it usually is  associated with a fever. Pt's relative reports that pt fractured her ulna and radius 2 days ago after falling and that fractures were never set, just wrapped. Pt's relative also notes that pt's vision has been worsening, indicating this as the reason she fell 2 days ago   We were asked to admit for work up and evaluation of the above problems.       Chief Complaint: Remains pleasantly confused and seems less so today. Denies specific complaints, but still states seeing things that aren't there.  Discussed with RN events overnight.     Review of Systems:  Fever/chills N   Cough y   Sputum    y   SOB/DOE  N   Chest Pain N   Abdominal Pain N   Diarrhea N   Constipation N   Nausea/Vomiting N   Dysuria N   Tolerating PT y   Tolerating Diet y   Could not obtain due to AMS vs intubation        Objective:     VITALS:   Last 24hrs VS reviewed since prior progress note. Most recent are:  Visit Vitals   Item Reading   ? BP 111/54   ? Pulse 68   ? Temp 97.3 F (36.3 C)   ? Resp 18   ? Ht 5' 2 (1.575 m)   ? Wt 144  lb 13.5 oz (65.7 kg)   ? SpO2 92%   ? PF 40L/min     Intake/Output Summary (Last 24 hours) at 05/19/09 0840  Last data filed at 05/19/09 0700   Gross per 24 hour   Intake      0 ml   Output    700 ml   Net   -700 ml       Telemetry Reviewed:     PHYSICAL EXAM:  General:  awake , nad, cooperative   HEENT: Pupils unequal, R 3mm, L 2mm, eomi  Lungs:             Essentially clear and non-labored.  Heart:  Regular rhythm, tachycardic, SEM  Abdomen: Soft, Non distended, hypoactive BS,RUQ pain,  no rebound, umbilical hernia non reducible,  Extremities: No edema, pulses present, brace in left arm  Neurologic: aa0x 2, no gross deficit  Psych: Alert and awake today    Lab Data Reviewed: (see below)    Medications Reviewed: (see below)    PMH/SH reviewed - no change compared to H&P  ______________________________________________________________________      Care Plan discussed with:  Patient x   Family    RN x   Care  Manager    Consultant/Specialist      >50% of visit spent in counseling and coordination of care y      Prophylaxis:  GI PPI   DVT SCD     Disposition:   Home with Family    HH/PT/OT/RN    SNF/LTC x   [facility]      ______________________________________________________________________  Attending Physician: Jesus A. Chandra, MD   _____________________________________________________________________________________________________  Procedures: see electronic medical records for all procedures/Xrays and details  which were not copied into this note but were reviewed prior to creation of Plan.      LABS:  Recent Labs   Basename 05/19/09 0355 05/18/09 0255   ? WBC 5.2 6.8   ? HGB 8.2* 7.8*   ? HCT 26.0* 24.9*   ? PLT 87* 87*     Recent Labs   Basename 05/19/09 0355 05/18/09 0255 05/17/09 0255   ? NA 140 136 141   ? K 3.9 4.4 3.8   ? CL 107 103 111*   ? CO2 26 22 24    ? BUN 10 9 12    ? CREA 0.7 0.8 0.7   ? GLU 107* 216* 86   ? CA 8.9 9.0 9.0   ? MG -- -- --   ? PHOS -- -- --   ? URICA -- -- --     Recent Labs   Basename 05/18/09 0255 05/17/09 0255   ? SGOT 24 29   ? GPT 28 32   ? AP 118 111   ? TBIL 1.5* 1.6*   ? TP 6.2* 6.2*   ? ALB 2.0* 2.0*   ? GLOB 4.2* 4.2*   ? GGT -- --   ? AML -- --   ? LPSE -- --     Recent Labs   Basename 05/17/09 0255   ? INR 1.4*   ? PTP 14.4*   ? APTT --         No results found for this basename: FE:2,TIBC:2,PSAT:2,FERR:2, in the last 72 hours     No results found for this basename: PH:2,PCO2:2,PO2:2, in the last 72 hours    No results found for this basename: CPK:3,CKMB:3,TROPONINI:3, in the last 72 hours    Lab Results   Component  Value Date/Time    POC GLUCOSE 137 05/19/2009  5:49 AM    POC GLUCOSE 119 05/19/2009 12:12 AM    POC GLUCOSE 105 05/18/2009  6:22 PM    POC GLUCOSE 210 05/18/2009 11:46 AM    POC GLUCOSE 113 05/18/2009  7:20 AM     Lab Results   Component Value Date/Time    Color DARK YELLOW 05/09/2009 11:05 AM    Appearance CLEAR 05/09/2009 11:05 AM    Specific gravity 1.010 10/29/2008 11:00 AM     Specific gravity 1.024 05/09/2009 11:05 AM    pH 6.0 05/09/2009 11:05 AM    Protein TRACE 05/09/2009 11:05 AM    Glucose NEGATIVE  05/09/2009 11:05 AM    Ketone NEGATIVE  05/09/2009 11:05 AM    Bilirubin NEGATIVE  04/30/2009 12:14 AM    Urobilinogen 0.2 05/09/2009 11:05 AM    Nitrites NEGATIVE  05/09/2009 11:05 AM    Leukocyte Esterase SMALL 05/09/2009 11:05 AM    Epithelial cells 0-5 05/09/2009 11:05 AM    Bacteria NEGATIVE  05/09/2009 11:05 AM    WBC 5-10 05/09/2009 11:05 AM    RBC 10-20 05/09/2009 11:05 AM     MEDICATIONS:  Current facility-administered medications   Medication Dose Route Frequency   ? lorazepam (ATIVAN) injection 1 mg  1 mg IntraVENous Q4H PRN   ? pneumococcal vaccine (PNU-IMMUNE) injection 0.5 mL  0.5 mL IntraMUSCular ONCE   ? TPN ADULT - PERIPHERAL   1,512 mL IntraVENous CONTINUOUS   ? pantoprazole  (PROTONIX ) tablet 40 mg  40 mg Oral ACB   ? dextrose  5 % - 0.9% NaCl infusion    IntraVENous CONTINUOUS   ? DISCONTD: TPN ADULT - PERIPHERAL  1,512 mL IntraVENous CONTINUOUS   ? rifaximin (XIFAXAN) tablet 400 mg  400 mg Oral TID   ? aspirin chewable tablet 81 mg  81 mg Oral DAILY   ? metoprolol  (LOPRESSOR ) tablet 25 mg  25 mg Oral Q12H   ? DISCONTD: levothyroxine (SYNTHROID) injection 25 mcg  25 mcg IntraVENous DAILY   ? piperacillin-tazobactam (ZOSYN) 3.375 g in 0.9% sodium chloride  (MBP/ADV) 100 mL MBP  3.375 g IntraVENous Q6H   ? albumin human 5% (BUMINATE) solution 25 g  25 g IntraVENous Q4H PRN   ? albuterol /ipratropium (DUONEB ) neb solution   1 Dose Nebulization Q4HWA RT   ? sodium chloride  (NS) flush 10 mL  10 mL InterCATHeter Q8H   ? sodium chloride  (NS) flush 10 mL  10 mL InterCATHeter PRN   ? heparin (porcine) pf 300 Units  300 Units InterCATHeter PRN   ? sodium chloride  (NS) flush 20 mL  20 mL InterCATHeter PRN   ? DISCONTD: pantoprazole  (PROTONIX ) injection 40 mg  40 mg IntraVENous DAILY   ? acetaminophen  (TYLENOL ) suppository 650 mg  650 mg Rectal Q6H PRN   ? albuterol  (PROVENTIL  VENTOLIN ) nebulizer  solution 2.5 mg  2.5 mg Nebulization Q2H PRN   ? thiamine (B-1) 100 mg in 0.9% sodium chloride  50 mL IVPB  100 mg IntraVENous DAILY   ? saline peripheral flush 5 mL  5 mL InterCATHeter PRN       Electronically signed by Chandra Byers, MD at 05/19/2009  9:10 PM EDT

## 2009-05-19 NOTE — Progress Notes (Signed)
 Formatting of this note is different from the original.  Problem: Neurolinguistics Impaired (Adult)  Goal: *Acute Goals and Plan of Care (Insert Text)  Speech Therapy Goals  Initiated 05/19/09  1. Patient will be Ox4 with no cues within 7 days  2. Patient will complete simple attention tasks with no more than redirection x1 within 7 days  3. Patient will provide 2-3 solutions to common ADL problems with min cues within 7 days  4. Patient will name 3 category members for moderate-complex categorization task with min cues within 7 days  SPEECH LANGUAGE PATHOLOGY EVALUATION    NAME : Breanna Lester  AGE:  48 y.o.  GENDER:  female  PRIMARY DIAGNOSIS AND MEDICAL HISTORY:  AMS, hypokalemia, lft upper extremity pain  AMS, hypokalemia, lft upper extremity pain  AMS, hypokalemia, lft upper extremity pain  Colonoscopy <principal problem not specified>  Procedure(s) (LRB):  ESOPHAGOGASTRODUODENOSCOPY (EGD) (N/A)  ESOPHAGOGASTRODUODENAL (EGD) BIOPSY (N/A) 2 Days Post-Op    Past Medical History   Diagnosis Date   ? Liver disease 2009       liver failure   ? Depression     ? Anxiety       Past Surgical History   Procedure Date   ? Hx tonsillectomy 1972   ? Hx adenoidectomy 1972   ? Hx cesarean section 1986 and 1993   ? Hx hysterectomy 2008   ? Hx dilation and curettage 1999   ? 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 I thought my husband and his girlfriend were not supposed to be here. Patient hallucinating. I feel  like a junky. in reference to feeling groggy. Agreeable to treatment. No pain reported.    OBJECTIVE:   Mental Status:  Level of Consciousness: Alert  Orientation Level: Oriented to place;Oriented to situation;Oriented to person  Cognition: Decreased attention/concentration;Follows commands;Impaired decision making;Memory loss;Poor safety awareness  Perception: Appears intact  Perseveration: No perseveration noted  Safety/Judgement: Decreased awareness of need for assistance;Decreased awareness of need for safety    Neuro-Linguistics:  Orientation: Patient oriented to name, age, birth date, month, place and situation without cues. Oriented to year with choices.   Fund of knowledge: Oriented to season of New Years, month of [place] Day, [place]  president, and [place]  capital without cues.    Reasoning : No impairment. 100% accuracy for verbal reasoning (similiarities) and divergent reasoning (absurdities)    Inferences: 100% accuracy  Figurative Language: 1/2 correct  Organizational: Categorization;Thought. Patient named 6 animals in 60 seconds (norm=14/60 seconds)  Problem Solving: Functional. Patient able to provide 2 solutions to common ADL problem; unable to state 3rd alternate  Memory : Short-term. Patient recalled 1/3 items after 5 minutes with no cues. 1/3 recalled with min cues  Attention :  Sustained attention;Easily redirected  Overall Impairment Severity: Mild-moderate    After treatment:  [X]    Patient left in no apparent distress  [X]    Call bell left within reach  [ ]    Caregiver present  [ ]    Patient left up in chair, nursing staff notified    ASSESSMENT:   Based on the objective data described above, the patient presents with mild-moderate integrated language deficits. I suspect mostly related to medication .  Patient appears to be more appropriate cognitively today however, still hallucinating.    Patient will benefit from skilled intervention to address the above impairments.    Patient?s rehabilitation  potential is considered to be Good    Factors which may influence rehabilitation potential include:  [ ]   None noted  [X]    Mental ability/status  [X]    Medical condition  [X]    Home/family situation and support systems  [ ]    Safety awareness  [ ]    Pain tolerance/management  [ ]   Other:     PLAN OF CARE:   Recommendations and Planned Interventions: SLP treatment    Frequency/Duration:  Patient will be followed by speech-language pathology 4 times a week  to address goals.    Discharge Recommendations:  [X]   To be determined                      [ ]     Day rehabilitation program  [ ]    Home without services               [ ]     Outpatient therapy  [ ]    Home with Home Health services         [ ]     Nursing facility for rehab  [ ]    Inpatient Rehab Hospital            [ ]     Nursing facility for long term care  [ ]    Other:    Communication/Collaboration:  [X]   Patient/family have participated as able in goal setting and plan of care.  [X]    Patient/family agree to work toward stated goals and plan of care.  [ ]   Patient understands intent and goals of therapy, but is neutral about his/her participation.  [ ]    Patient is unable to participate in goal setting and plan of care.    The patient?s plan of care was discussed with:  Physical Therapy Assistant and Registered Nurse    Thank you for this referral.    Eleanor DELENA Dunker, SLP    Time Calculation: 25 mins          Electronically signed by Dunker Eleanor DELENA, SLP at 05/19/2009 12:55 PM EDT

## 2009-05-19 NOTE — Progress Notes (Signed)
 Formatting of this note might be different from the original.  Insurance claims handler Review of Student Work    05/19/2009  - Shift times - 1700 to 2130    The student documentation of patient care for Areta Terwilliger has been reviewed and approved.  All medications have been administered under the direct supervision of the faculty or preceptor.    Electronically signed by Lanell Simonne HERO, RN at 05/19/2009  9:36 PM EDT

## 2009-05-19 NOTE — Progress Notes (Signed)
Problem: Mobility Impaired (Adult)  Goal: *Acute Goals and Plan of Care (Insert Text)  Physical Therapy Goals  Initiated 05/12/2009  1. Patient will move from supine to sit and sit to supine , scoot up and down and roll side to side in bed with moderate assistance within 7 day(s).   2. Patient will transfer from bed to chair and chair to bed with moderate assistance using the least restrictive device within 7 day(s).  3. Patient will perform sit to stand with moderate assistance within 7 day(s).  4. Patient will be able to sit at EOB x 5 minutes while maintaining midline with minimum assist within 7 days.   5. Patient will perform 1 sets of 15 repetitions of active strengthening exercises for bilateral upper and lower extremity(s) with supervision/set-up within 7 day(s).     PHYSICAL THERAPY TREATMENT    NAME: Breanna Lester AGE: 48 y.o.  GENDER: female  DATE: 05/19/2009  PRIMARY DIAGNOSIS: AMS, hypokalemia, lft upper extremity pain  AMS, hypokalemia, lft upper extremity pain  AMS, hypokalemia, lft upper extremity pain  Colonoscopy  <principal problem not specified>  Procedure(s) (LRB):  ESOPHAGOGASTRODUODENOSCOPY (EGD) (N/A)  ESOPHAGOGASTRODUODENAL (EGD) BIOPSY (N/A)  2 Days Post-Op    Precautions: Fall, Contact  Chart , physical therapy assessment, plan of care and goals  w ere  reviewed.     SUBJECTIVE:   Patient stated ??? I will try to clean myself .???     OBJECTIVE DATA SUMMARY:   Critical Behavior:  Level of Consciousness: Alert  Orientation Level: Oriented to place;Oriented to situation;Oriented to person  Cognition: Decreased attention/concentration;Follows commands;Impaired decision making;Memory loss;Poor safety awareness  Safety/Judgement: Decreased awareness of need for assistance;Decreased awareness of need for safety  Functional Mobility Training:  Bed Mobility:  Rolling: Additional time;Minimal  assistance;Verbal cues  to avoid using LUE   Supine to Sit: Additional time;Minimal assistance;Verbal cues  to push up through arm  Transfers:  Sit to Stand: Assist X2;Minimum assistance;Moderate assistance ( verbal cues for hand [to not push with L wrist] and foot placement)  Difficulty getting pelvis/trunk over BOS; Unable to attain balance in standing with support  Balance:  Sitting: Impaired  Sitting - Static: Fair   Able to sit at EOB with CGA to SBA today.  Sitting - Dynamic:  (poor progressing to fair)  Standing - Static: Constant support;Poor (leans posteriorly and occasionally to the left)  Standing - Dynamic : Impaired    Pain: No c/o pain today    Activity Tolerance: Per nursing  Vitals Assessment 1:  Patient Position: Prone  BP: 128/65 mmHg  Pulse (Heart Rate): 65   O2 Sat (%): 93 %  O2 Device: Room air                  After treatment:  [X]       Patient left in no apparent distress  [X]       Call bell left within reach  [ ]       Caregiver present  [X]       Patient left up in chair  with bed alarm on , nursing staff notified      ASSESSMENT AND PROGRESSION TOWARD GOALS:   Pt able to hold trunk upright at EOB with several small LOB that pt was able to recover independently.  Still requires assist with standing and maintaining standing due to difficulty getting body over BOS.  Pt agreeable and motivated to participate in therapy with less confusion today.  Pt has decreased  safety awareness and requires periodical reminders of NWB through left wrist. Pt has deficits in balance, strength and functional mobility that will continue to benefit from therapy.    Patient???s progression toward goals is as follows:  [ ]       Improving appropriately and progressing toward goals  [X]       Improving slowly and progressing toward goals  [ ]       Not making progress toward goals and plan of care will be adjusted      PLAN OF CARE:    Patient continues to benefit from skilled intervention to address the above impairments.  Continue treatment per established plan of care.  Planned Interventions:  [X]       Bed Mobility Training            [ ]       Neuromuscular Re-education  [X]       Transfer Training                    [ ]       Orthotic/Prosthetic Training  [X]       Gait Training                           [ ]       Modalities  [X]      Therapeutic Exercises           [ ]       Edema Management/Control  [X]       Therapeutic Activities            [X]       Patient and Family Training/Education  [ ]       Other (comment):  Discharge Recommendations:  [ ]       To be determined                    [ ]       Day rehabilitation program  [ ]       Home without services            [ ]       Outpatient therapy  [ ]       Home with Home Health services      [X]       Nursing facility for rehab  [ ]       Inpatient [facility]         [ ]       Nursing facility for long term care  [ ]       Other:  Further Equipment Recommendations for Discharge: TBD  Communication/Collaboration:  The patient???s plan of care was discussed with:  Occupational Therapist, Registered Nurse and Rehabilitation Attendant    Riniyah Speich A. Meily Glowacki  Time Calculation: 20 mins

## 2009-05-19 NOTE — Progress Notes (Signed)
Problem: Self Care Deficits Care Plan (Adult)  Goal: *Acute Goals and Plan of Care (Insert Text)  Occupational Therapy Goals reviewed and revised ( as needed) as per 9/29 reevaluation.  1. Pt will complete grooming in supported position with minimal assistance within 7 days.  2. Pt will increase fine motor skills to open task containers (toothpaste, lotion, ect) with set up assist within 7 days.  3. Pt. Will perform SPT to/from [facility] with min assist within 7 days. (established 9/29)  4. Pt will tolerated x 10 min edge of bed with minimal assistance while completing dynamic balance activity within 7 days.  5. Pt will move from supine to sit with minimal assistance within 7 days, in preparation for functional mobility.   OCCUPATIONAL THERAPY TREATMENT : WEEKLY REASSESSMENT    NAME: Breanna Lester AGE: 48 y.o.  GENDER: female  DATE: 05/19/2009  PRIMARY DIAGNOSIS: AMS, hypokalemia, lft upper extremity pain  AMS, hypokalemia, lft upper extremity pain  AMS, hypokalemia, lft upper extremity pain  Colonoscopy  <principal problem not specified>  Procedure(s) (LRB):  ESOPHAGOGASTRODUODENOSCOPY (EGD) (N/A)  ESOPHAGOGASTRODUODENAL (EGD) BIOPSY (N/A)  2 Days Post-Op     Precautions: Falls, NWB on LLE.  SUBJECTIVE:   Patient stated ??? I'm scared to stand".      OBJECTIVE DATA SUMMARY:   Cognitive/Behavioral Status:  Level of Consciousness: Alert;Confused  Orientation Level: Oriented to person;Oriented to place;Oriented to situation  Cognition: Decreased attention/concentration;Follows commands;Impaired decision making;Impulsive;Poor safety awareness  Safety/Judgement: Decreased awareness of need for safety  Functional Mobility and Transfers for ADLs:  Bed Mobility:  Overall level of assistance required: Presented up in sitting EOB with PTA  Transfers:  Sit to Stand: Assist X2;Minimum assistance;Moderate assistance    Bed to Chair: Maximum assistance;Assist X2 stand pivot after toileting /toilet transfers.  Assistance needed for weight shifting to clear feet when attempting to take steps.                             Toilet Transfer : Moderate assistance (of 1 and min A of another stand pivot to [facility])                 Balance:  Sitting: Impaired  Sitting - Static: Good (unsupported)  Sitting - Dynamic:  (poor progressing to fair-patient min assist for dynamic bala)  Standing: Impaired  Standing - Static: Constant support;Poor  Standing - Dynamic : Impaired (poor)  ADL Intervention:    Toileting  Toileting Assistance: Supervision/set up (for bowel hygiene performed in sitting. )  Clothing Management:  (Total assist)     Cognitive Retraining  Problem Solving: Awareness of environment;Deductive reason;Identifying the problem (during functional transfers)  Organizing/Sequencing: Breaking task down (for sit to stand & SPT to/from [facility] with walker.)  Attention to Task: Distractibility  Following Commands: Follows two step commands/directions  Safety/Judgement: Decreased awareness of need for safety  Cues: Tactile cues provided;Verbal cues provided;Visual cues provided     After treatment:  [X]    Patient left in NAD  [X]    Call bell left within reach  [ ]    Caregiver present  [X]    Patient left up in chair, nursing staff notified      ASSESSMENT AND PROGRESSION TOWARD GOALS:   Patient alert and overall cooperative today. Still some what confused and impulsive limiting her safety during transfers. Patient is slowly progressing, but remains limited by her decreased activity tolerance, GW, impaired cognition, decrease balance and decreased safety awareness  which is impairing her functional independence. Patient continues to benefit from OT services.  Patient???s progression toward goals since last assessment: Goals remain appropriate. New goal for [facility] transfers added to care plan      PLAN OF CARE:    Goals have been updated based on progression since last assessment.  Patient continues to benefit from skilled intervention to address the above impairments.  Continue to follow patient 3-5 times a week  to address goals.  Planned Interventions:  [X]    Self Care Training                    [X]     Therapeutic Activities  [X]    Functional Mobility Training     [X]     Cognitive Retraining  [X]    Therapeutic Exercises              [X]     Endurance Activities  [X]    Balance Training                       [X]     Neuromuscular Re-education  [X]    Patient Education                      [X]     Family Training/Education  [ ]    Visual/Perceptual Training         [X]     Home safety training  [ ]    Other (Comment):  Discharge Recommendations:  [ ]    To be determined                                   [ ]     Day rehabilitation program  [ ]    Home without services  (home safety education provided)  [ ]    Home with Home Health services  (home safety education provided)  [ ]   Outpatient therapy                                  [X]     Nursing facility for rehab  VS.  [X]    [facility]                 [ ]     Nursing facility for long term care  [ ]    Other:  Further Equipment Recommendations for Discharge: TBD  Communication/Collaboration:  The patient???s plan of care was discussed with:  Physical Therapy Assistant    Michel Harrow, OTR/L  Time Calculation: 20 mins

## 2009-05-19 NOTE — Progress Notes (Signed)
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    Pain: Patient complains of abdominal pain no.   Bowel Movements: Normal    There is no bleeding    GI note    Breanna Lester is doing well from a GI standpoint      She is eating well.  She is alert and oriented.  She has not abdominal pain.          Current facility-administered medications   Medication Dose Route Frequency   ??? lorazepam (ATIVAN) injection 1 mg  1 mg IntraVENous Q4H PRN   ??? pneumococcal vaccine (PNU-IMMUNE) injection 0.5 mL  0.5 mL IntraMUSCular ONCE   ??? TPN ADULT - PERIPHERAL   1,512 mL IntraVENous CONTINUOUS   ??? pantoprazole (PROTONIX) tablet 40 mg  40 mg Oral ACB   ??? dextrose 5 % - 0.9% NaCl infusion    IntraVENous CONTINUOUS   ??? DISCONTD: TPN ADULT - PERIPHERAL  1,512 mL IntraVENous CONTINUOUS   ??? rifaximin (XIFAXAN) tablet 400 mg  400 mg Oral TID   ??? aspirin chewable tablet 81 mg  81 mg Oral DAILY   ??? metoprolol (LOPRESSOR) tablet 25 mg  25 mg Oral Q12H   ??? piperacillin-tazobactam (ZOSYN) 3.375 g in 0.9% sodium chloride (MBP/ADV) 100 mL MBP  3.375 g IntraVENous Q6H   ??? albumin human 5% (BUMINATE) solution 25 g  25 g IntraVENous Q4H PRN   ??? albuterol/ipratropium (DUONEB) neb solution   1 Dose Nebulization Q4HWA RT   ??? sodium chloride (NS) flush 10 mL  10 mL InterCATHeter Q8H   ??? sodium chloride (NS) flush 10 mL  10 mL InterCATHeter PRN   ??? heparin (porcine) pf 300 Units  300 Units InterCATHeter PRN   ??? sodium chloride (NS) flush 20 mL  20 mL InterCATHeter PRN   ??? DISCONTD: pantoprazole (PROTONIX) injection 40 mg  40 mg IntraVENous DAILY   ??? acetaminophen (TYLENOL) suppository 650 mg  650 mg Rectal Q6H PRN   ??? albuterol (PROVENTIL VENTOLIN) nebulizer solution 2.5 mg  2.5 mg Nebulization Q2H PRN   ??? thiamine (B-1) 100 mg in 0.9% sodium chloride 50 mL IVPB  100 mg IntraVENous DAILY    ??? saline peripheral flush 5 mL  5 mL InterCATHeter PRN          Objective:     Blood pressure 160/70, pulse 66, temperature 98.6 ??F (37 ??C), resp. rate 18, height 5\' 2"  (1.575 m), weight 144 lb 13.5 oz (65.7 kg), SpO2 94.00%, peak flow 40 L/min.    In: 100 (100 I.V.)  Out: 400 (400 Urine)      In: 1882.6 (240 P.O. 1642.6 I.V.)  Out: 1325 (1325 Urine)      Alert, oriented, no distress    Data Review  Recent Results (from the past 24 hour(s))   GLUCOSE, POC    Collection Time    05/18/09  6:22 PM   Component Value Range   ??? POC GLUCOSE 105  65 - 105 (mg/dL)   GLUCOSE, POC    Collection Time    05/19/09 12:12 AM   Component Value Range   ??? POC GLUCOSE 119 (*) 65 - 105 (mg/dL)   METABOLIC PANEL, BASIC    Collection Time    05/19/09  3:55 AM  Component Value Range   ??? Sodium 140  136 - 145 (MMOL/L)   ??? Potassium 3.9  3.5 - 5.1 (MMOL/L)   ??? Chloride 107  97 - 108 (MMOL/L)   ??? CO2 26  21 - 32 (MMOL/L)   ??? Anion gap 7  5 - 15 (mmol/L)   ??? Glucose 107 (*) 65 - 100 (MG/DL)   ??? BUN 10  6 - 20 (MG/DL)   ??? Creatinine 0.7  0.6 - 1.3 (MG/DL)   ??? BUN/Creatinine ratio 14  12 - 20 ( )   ??? GFR est AA >60  >60 (ml/min/1.58m2)   ??? GFR est non-AA >60  >60 (ml/min/1.52m2)   ??? Calcium 8.9  8.5 - 10.1 (MG/DL)   CBC WITH AUTOMATED DIFF    Collection Time    05/19/09  3:55 AM   Component Value Range   ??? WBC 5.2  3.6 - 11.0 (K/uL)   ??? RBC 2.87 (*) 3.80 - 5.20 (M/uL)   ??? HGB 8.2 (*) 11.5 - 16.0 (g/dL)   ??? HCT 26.0 (*) 35.0 - 47.0 (%)   ??? MCV 90.6  80.0 - 99.0 (FL)   ??? MCH 28.6  26.0 - 34.0 (PG)   ??? MCHC 31.5  30.0 - 36.5 (g/dL)   ??? RDW 21.5 (*) 11.5 - 14.5 (%)   ??? PLATELET 87 (*) 150 - 400 (K/uL)   ??? NEUTROPHILS 66  32 - 75 (%)   ??? LYMPHOCYTES 25  12 - 49 (%)   ??? MONOCYTES 7  5 - 13 (%)   ??? EOSINOPHILS 1  0 - 7 (%)   ??? BASOPHILS 1  0 - 1 (%)   ??? ABSOLUTE NEUTS 3.3  1.8 - 8.0 (K/UL)   ??? ABSOLUTE LYMPHS 1.3  0.8 - 3.5 (K/UL)   ??? ABSOLUTE MONOS 0.4  0.0 - 1.0 (K/UL)   ??? ABSOLUTE EOSINS 0.1  0.0 - 0.4 (K/UL)   ??? ABSOLUTE BASOS 0.1  0.0 - 0.1 (K/UL)    ??? DF SMEAR SCANNED     ??? RBC COMMENTS 1+ ANISOCYTOSIS     GLUCOSE, POC    Collection Time    05/19/09  5:49 AM   Component Value Range   ??? POC GLUCOSE 137 (*) 65 - 105 (mg/dL)   GLUCOSE, POC    Collection Time    05/19/09 11:29 AM   Component Value Range   ??? POC GLUCOSE 130 (*) 65 - 105 (mg/dL)       EGD biopsies:  Duodenum, biopsy:  Normal small intestinal mucosa.  2. Stomach, biopsy:  Benign fundic and antral mucosa with mild reactive gastropathy.  Giemsa stain for Helicobacter pylori is negative.  3. Mid/distal esophagus, biopsy:  Benign squamous mucosa with features suggestive of reflux  esophagitis.  No evidence of eosinophilic esophagitis or Barrett's.      srt/05/18/2009        Assessment:     Patient Active Hospital Problem List:  Nausea with vomiting (05/01/2009)    Hypokalemia (05/01/2009)    Hepatic encephalopathy (05/01/2009)    Gait abnormality (05/01/2009)     cirrhosis of liver (05/01/2009)    Hypomagnesemia (05/01/2009)      Plan:     Supportive care as you are doing    I will follow    [name], MD  5:13 PM  05/19/2009

## 2009-05-19 NOTE — Progress Notes (Signed)
Responded to staff request to visit patient who was requesting pastoral care.  Offered empathetic listening and support to pt who described conflicted family situations.  Patient was tearful and visibly upset. Pt appeared confused at times.  Offered assurance to patient and prayer.  Discussed coping skills with patient.  Prayed with patient. Pt appeared calmer and thanked chaplain for coming.  Pastoral care will continue to follow patient.    Visit by: Rev. Wesson Stith L. Dockum, D.Min, MA, PRN Chaplain

## 2009-05-19 NOTE — Progress Notes (Signed)
Problem: Neurolinguistics Impaired (Adult)  Goal: *Acute Goals and Plan of Care (Insert Text)  Speech Therapy Goals  Initiated 05/19/09  1. Patient will be Ox4 with no cues within 7 days  2. Patient will complete simple attention tasks with no more than redirection x1 within 7 days  3. Patient will provide 2-3 solutions to common ADL problems with min cues within 7 days  4. Patient will name 3 category members for moderate-complex categorization task with min cues within 7 days  SPEECH LANGUAGE PATHOLOGY EVALUATION    NAME : Breanna Lester  AGE:  48 y.o.  GENDER:  female  PRIMARY DIAGNOSIS AND MEDICAL HISTORY:  AMS, hypokalemia, lft upper extremity pain  AMS, hypokalemia, lft upper extremity pain  AMS, hypokalemia, lft upper extremity pain  Colonoscopy <principal problem not specified>  Procedure(s) (LRB):  ESOPHAGOGASTRODUODENOSCOPY (EGD) (N/A)  ESOPHAGOGASTRODUODENAL (EGD) BIOPSY (N/A) 2 Days Post-Op     Past Medical History   Diagnosis Date   ??? Liver disease 2009       liver failure   ??? Depression     ??? Anxiety         Past Surgical History   Procedure Date   ??? Hx tonsillectomy 1972   ??? Hx adenoidectomy 1972   ??? Hx cesarean section 1986 and 1993   ??? Hx hysterectomy 2008   ??? Hx dilation and curettage 1999   ??? Upper gi endoscopy,biopsy 05/17/2009             Problem List         Class     Nausea with vomiting [787.01]             Hypokalemia [276.8A]             Hepatic encephalopathy [572.2]             Gait abnormality [781.2N]             Alcoholic cirrhosis of liver [571.2]             Hypomagnesemia [275.2B]                     Prior Level of Function/Home Situation:  Home Environment: Private residence  Living Alone: No (lives with husband and two adult daughters)  Support Systems: Family member(s)  Patient Expects to be Discharged to:: Rehabilitation facility      SUBJECTIVE:    Patient stated ??? I thought my husband and his girlfriend were not supposed to be here." Patient hallucinating. "I feel like a junky." in reference to feeling groggy. Agreeable to treatment. No pain reported.      OBJECTIVE:   Mental Status:  Level of Consciousness: Alert  Orientation Level: Oriented to place;Oriented to situation;Oriented to person  Cognition: Decreased attention/concentration;Follows commands;Impaired decision making;Memory loss;Poor safety awareness  Perception: Appears intact  Perseveration: No perseveration noted  Safety/Judgement: Decreased awareness of need for assistance;Decreased awareness of need for safety    Neuro-Linguistics:  Orientation: Patient oriented to name, age, birth date, month, place and situation without cues. Oriented to year with choices.   Fund of knowledge: Oriented to season of New Years, month of Independence Day, [place] president, and [place] capital without cues.    Reasoning : No impairment. 100% accuracy for verbal reasoning (similiarities) and divergent reasoning (absurdities)    Inferences: 100% accuracy  Figurative Language: 1/2 correct  Organizational: Categorization;Thought. Patient named 6 animals in 60 seconds (norm=14/60 seconds)  Problem Solving: Functional. Patient able to  provide 2 solutions to common ADL problem; unable to state 3rd alternate  Memory : Short-term. Patient recalled 1/3 items after 5 minutes with no cues. 1/3 recalled with min cues  Attention : Sustained attention;Easily redirected  Overall Impairment Severity: Mild-moderate    After treatment:  [X]    Patient left in no apparent distress  [X]    Call bell left within reach  [ ]    Caregiver present  [ ]    Patient left up in chair, nursing staff notified      ASSESSMENT:    Based on the objective data described above, the patient presents with mild-moderate integrated language deficits. I suspect mostly related to medication .  Patient appears to be more appropriate cognitively today however, still hallucinating.    Patient will benefit from skilled intervention to address the above impairments.    Patient???s rehabilitation potential is considered to be Good    Factors which may influence rehabilitation potential include:  [ ]   None noted  [X]    Mental ability/status  [X]    Medical condition  [X]    Home/family situation and support systems  [ ]    Safety awareness  [ ]    Pain tolerance/management  [ ]   Other:      PLAN OF CARE:   Recommendations and Planned Interventions: SLP treatment    Frequency/Duration:  Patient will be followed by speech-language pathology 4 times a week  to address goals.    Discharge Recommendations:  [X]   To be determined                      [ ]     Day rehabilitation program  [ ]    Home without services               [ ]     Outpatient therapy  [ ]    Home with Home Health services         [ ]     Nursing facility for rehab  [ ]    Inpatient [facility]            [ ]     Nursing facility for long term care  [ ]    Other:    Communication/Collaboration:  [X]   Patient/family have participated as able in goal setting and plan of care.  [X]    Patient/family agree to work toward stated goals and plan of care.  [ ]   Patient understands intent and goals of therapy, but is neutral about his/her participation.  [ ]    Patient is unable to participate in goal setting and plan of care.               The patient???s plan of care was discussed with:  Physical Therapy Assistant and Registered Nurse    Thank you for this referral.    Reginia Forts, SLP    Time Calculation: 25 mins

## 2009-05-19 NOTE — Progress Notes (Signed)
Hospitalist Progress Note         NAME: Breanna Lester        DOB:  21-Jan-1961        MRN:  [id number]      Assessment & Plan   Sepsis syndrome :underlying pneumonia, fever, leukocytosis, resolved. off vanco and LVQ. Continue Zosyn to complete 14 day course.  ????????????????????????No evidence of UTI or ascites(SBP), has an umbilical hernia that is not     reducible but overall adbomen is soft, has diarrhea with dark stools    Altered mental status : multifactorial Sepsis, hypernatremia, ARF, hyperamonemia   Has improved.Hepatic encephalopathy (05/01/2009) on rifaximin now, GI input appreciated. Taken off lactulose because of diarrhea, still has rectal tube in place.    NSTEMI:on ASA and beta-blockers, Received  Lovenox earlier, Cardiology in the case, ischemic workup when stable either before d/c or as otpatient      Anemia, dark stools, diarrhea: egd revealed Schatzki's ring/ HH, bx pending.  S/p egd, no obvious bleed source. Hgb to 7.8, will transfuse lower.    Hypothyroidism(?) tsh suppressed < 0.01, hold levothyroxine, recheck tsh remains suppressed but Ft4 upper limit nl, stopped levothyroxine 9/28.    Gait abnormality (05/01/2009) B12, folate normal, PT/OT     Nutrition: Currently on TPN, . Advancing diet, likely stop tpn aftyer tomorrow if intake is better. D/c tpn after current supply complete.    Left wrist Fracture: casted Ortho following the case    Acute respiratory failure:got extubated doing well off the ventilator    ARF: resolved    Rhabdomyolisis: resolved    Hypernatremia: resolved, now off IVF.    Dispo: snf         Subjective:     [name] is a 48 y.o.?? Caucasian female whom presents with complaint of left forearm pain, nausea and vomiting.   Patient with a hx of cirrohsis presents ambulatory to ED with cc of vomiting. Pt's relative reports nausea and vomiting "all day".?? Per family member, when pt experiences N/V secondary to cirrhosis, it usually is associated with a fever. Pt's relative reports that pt fractured her ulna and radius 2 days ago after falling and that fractures were never "set", just "wrapped". Pt's relative also notes that pt's vision has been worsening, indicating this as the reason she fell 2 days ago   We were asked to admit for work up and evaluation of the above problems.   ????    Chief Complaint: Remains pleasantly confused and seems less so today. Denies specific complaints, but still states seeing things that aren't there.  Discussed with RN events overnight.     Review of Systems:  Fever/chills N   Cough y   Sputum    y   SOB/DOE  N   Chest Pain N   Abdominal Pain N   Diarrhea N   Constipation N   Nausea/Vomiting N   Dysuria N   Tolerating PT y   Tolerating Diet y   Could not obtain due to AMS vs intubation         Objective:       VITALS:   Last 24hrs VS reviewed since prior progress note. Most recent are:  Visit Vitals   Item Reading   ??? BP 111/54   ??? Pulse 68   ??? Temp 97.3 ??F (36.3 ??C)   ??? Resp 18   ??? Ht 5\' 2"  (1.575 m)   ??? Wt 144 lb 13.5 oz (65.7 kg)   ???  SpO2 92%   ??? PF 40L/min           Intake/Output Summary (Last 24 hours) at 05/19/09 0840  Last data filed at 05/19/09 0700   Gross per 24 hour   Intake      0 ml   Output    700 ml   Net   -700 ml        Telemetry Reviewed:     PHYSICAL EXAM:  General:  awake , nad, cooperative   HEENT: Pupils unequal, R 3mm, L 2mm, eomi  Lungs:             Essentially clear and non-labored.  Heart:  Regular rhythm, tachycardic, SEM  Abdomen: Soft, Non distended, hypoactive BS,RUQ pain,  no rebound, umbilical hernia non reducible,  Extremities: No edema, pulses present, brace in left arm  Neurologic:?? aa0x 2, no gross deficit  Psych:???? Alert and awake today    Lab Data Reviewed: (see below)     Medications Reviewed: (see below)    PMH/SH reviewed - no change compared to H&P  ______________________________________________________________________        Care Plan discussed with:  Patient x   Family    RN x   Care Manager    Consultant/Specialist      >50% of visit spent in counseling and coordination of care y      Prophylaxis:  GI PPI   DVT SCD     Disposition:   Home with Family    HH/PT/OT/RN    SNF/LTC x   [facility]      ______________________________________________________________________  Attending Physician: Kerron Sedano A. Bradly Bienenstock, MD   _____________________________________________________________________________________________________  Procedures: see electronic medical records for all procedures/Xrays and details  which were not copied into this note but were reviewed prior to creation of Plan.      LABS:  Recent Labs   Basename 05/19/09 0355 05/18/09 0255   ??? WBC 5.2 6.8   ??? HGB 8.2* 7.8*   ??? HCT 26.0* 24.9*   ??? PLT 87* 87*         Recent Labs   Basename 05/19/09 0355 05/18/09 0255 05/17/09 0255   ??? NA 140 136 141   ??? K 3.9 4.4 3.8   ??? CL 107 103 111*   ??? CO2 26 22 24    ??? BUN 10 9 12    ??? CREA 0.7 0.8 0.7   ??? GLU 107* 216* 86   ??? CA 8.9 9.0 9.0   ??? MG -- -- --   ??? PHOS -- -- --   ??? URICA -- -- --         Recent Labs   Basename 05/18/09 0255 05/17/09 0255   ??? SGOT 24 29   ??? GPT 28 32   ??? AP 118 111   ??? TBIL 1.5* 1.6*   ??? TP 6.2* 6.2*   ??? ALB 2.0* 2.0*   ??? GLOB 4.2* 4.2*   ??? GGT -- --   ??? AML -- --   ??? LPSE -- --         Recent Labs   [facility] 05/17/09 0255   ??? INR 1.4*   ??? PTP 14.4*   ??? APTT --          No results found for this basename: FE:2,TIBC:2,PSAT:2,FERR:2, in the last 72 hours     No results found for this basename: PH:2,PCO2:2,PO2:2, in the last 72 hours      No results found for  this basename: CPK:3,CKMB:3,TROPONINI:3, in the last 72 hours    Lab Results   Component Value Date/Time    POC GLUCOSE 137 05/19/2009  5:49 AM    POC GLUCOSE 119 05/19/2009 12:12 AM    POC GLUCOSE 105 05/18/2009  6:22 PM     POC GLUCOSE 210 05/18/2009 11:46 AM    POC GLUCOSE 113 05/18/2009  7:20 AM         Lab Results   Component Value Date/Time    Color DARK YELLOW 05/09/2009 11:05 AM    Appearance CLEAR 05/09/2009 11:05 AM    Specific gravity 1.010 10/29/2008 11:00 AM    Specific gravity 1.024 05/09/2009 11:05 AM    pH 6.0 05/09/2009 11:05 AM    Protein TRACE 05/09/2009 11:05 AM    Glucose NEGATIVE  05/09/2009 11:05 AM    Ketone NEGATIVE  05/09/2009 11:05 AM    Bilirubin NEGATIVE  04/30/2009 12:14 AM    Urobilinogen 0.2 05/09/2009 11:05 AM    Nitrites NEGATIVE  05/09/2009 11:05 AM    Leukocyte Esterase SMALL 05/09/2009 11:05 AM    Epithelial cells 0-5 05/09/2009 11:05 AM    Bacteria NEGATIVE  05/09/2009 11:05 AM    WBC 5-10 05/09/2009 11:05 AM    RBC 10-20 05/09/2009 11:05 AM           MEDICATIONS:  Current facility-administered medications   Medication Dose Route Frequency   ??? lorazepam (ATIVAN) injection 1 mg  1 mg IntraVENous Q4H PRN   ??? pneumococcal vaccine (PNU-IMMUNE) injection 0.5 mL  0.5 mL IntraMUSCular ONCE   ??? TPN ADULT - PERIPHERAL   1,512 mL IntraVENous CONTINUOUS   ??? pantoprazole (PROTONIX) tablet 40 mg  40 mg Oral ACB   ??? dextrose 5 % - 0.9% NaCl infusion    IntraVENous CONTINUOUS   ??? DISCONTD: TPN ADULT - PERIPHERAL  1,512 mL IntraVENous CONTINUOUS   ??? rifaximin (XIFAXAN) tablet 400 mg  400 mg Oral TID   ??? aspirin chewable tablet 81 mg  81 mg Oral DAILY   ??? metoprolol (LOPRESSOR) tablet 25 mg  25 mg Oral Q12H   ??? DISCONTD: levothyroxine (SYNTHROID) injection 25 mcg  25 mcg IntraVENous DAILY   ??? piperacillin-tazobactam (ZOSYN) 3.375 g in 0.9% sodium chloride (MBP/ADV) 100 mL MBP  3.375 g IntraVENous Q6H   ??? albumin human 5% (BUMINATE) solution 25 g  25 g IntraVENous Q4H PRN   ??? albuterol/ipratropium (DUONEB) neb solution   1 Dose Nebulization Q4HWA RT   ??? sodium chloride (NS) flush 10 mL  10 mL InterCATHeter Q8H   ??? sodium chloride (NS) flush 10 mL  10 mL InterCATHeter PRN    ??? heparin (porcine) pf 300 Units  300 Units InterCATHeter PRN   ??? sodium chloride (NS) flush 20 mL  20 mL InterCATHeter PRN   ??? DISCONTD: pantoprazole (PROTONIX) injection 40 mg  40 mg IntraVENous DAILY   ??? acetaminophen (TYLENOL) suppository 650 mg  650 mg Rectal Q6H PRN   ??? albuterol (PROVENTIL VENTOLIN) nebulizer solution 2.5 mg  2.5 mg Nebulization Q2H PRN   ??? thiamine (B-1) 100 mg in 0.9% sodium chloride 50 mL IVPB  100 mg IntraVENous DAILY   ??? saline peripheral flush 5 mL  5 mL InterCATHeter PRN
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
## 2009-05-19 NOTE — Progress Notes (Signed)
Faculty or Preceptor Review of Student Work    05/19/2009  - Shift times - 1700 to 2130    The student documentation of patient care for Breanna Lester has been reviewed and approved.  All medications have been administered under the direct supervision of the faculty or preceptor.

## 2009-05-20 LAB — GLUCOSE, POC
Glucose (POC): 97 mg/dL (ref 65–105)
Glucose (POC): 96 mg/dL (ref 65–105)
Glucose (POC): 216 mg/dL — ABNORMAL HIGH (ref 65–105)
Glucose (POC): 103 mg/dL (ref 65–105)
Glucose (POC): 85 mg/dL (ref 65–105)

## 2009-05-20 MED ADMIN — piperacillin-tazobactam (ZOSYN) 3.375 g in 0.9% sodium chloride (MBP/ADV) 100 mL MBP: INTRAVENOUS | @ 05:00:00 | NDC 00338055318

## 2009-05-20 MED ADMIN — sodium chloride (NS) flush 20 mL: @ 16:00:00 | NDC 87701099893

## 2009-05-20 MED ADMIN — sodium chloride (NS) flush 20 mL: @ 19:00:00 | NDC 87701099893

## 2009-05-20 MED ADMIN — albuterol/ipratropium (DUONEB) neb solution: RESPIRATORY_TRACT | @ 11:00:00 | NDC 00487980101

## 2009-05-20 MED ADMIN — sodium chloride (NS) flush 10 mL: @ 02:00:00 | NDC 87701099893

## 2009-05-20 MED ADMIN — heparin (porcine) pf 300 Units: @ 16:00:00

## 2009-05-20 MED ADMIN — sodium chloride (NS) flush 10 mL: @ 19:00:00 | NDC 87701099893

## 2009-05-20 MED ADMIN — metoprolol (LOPRESSOR) tablet 25 mg: ORAL | @ 01:00:00 | NDC 51079025501

## 2009-05-20 MED ADMIN — pantoprazole (PROTONIX) tablet 40 mg: ORAL | @ 10:00:00 | NDC 00008084181

## 2009-05-20 MED ADMIN — rifaximin (XIFAXAN) tablet 400 mg: ORAL | @ 02:00:00 | NDC 65649030105

## 2009-05-20 MED ADMIN — albuterol/ipratropium (DUONEB) neb solution: RESPIRATORY_TRACT | @ 01:00:00 | NDC 00487980101

## 2009-05-20 MED ADMIN — piperacillin-tazobactam (ZOSYN) 3.375 g in 0.9% sodium chloride (MBP/ADV) 100 mL MBP: INTRAVENOUS | @ 10:00:00 | NDC 00338055318

## 2009-05-20 MED ADMIN — rifaximin (XIFAXAN) tablet 400 mg: ORAL | @ 14:00:00 | NDC 65649030105

## 2009-05-20 MED ADMIN — metoprolol (LOPRESSOR) tablet 25 mg: ORAL | @ 14:00:00 | NDC 51079025501

## 2009-05-20 MED ADMIN — rifaximin (XIFAXAN) tablet 400 mg: ORAL | @ 22:00:00 | NDC 65649030105

## 2009-05-20 MED ADMIN — aspirin chewable tablet 81 mg: ORAL | @ 14:00:00

## 2009-05-20 MED ADMIN — dextrose 5 % - 0.9% NaCl infusion: INTRAVENOUS | @ 08:00:00 | NDC 00409794109

## 2009-05-20 MED ADMIN — sodium chloride (NS) flush 10 mL: @ 10:00:00 | NDC 87701099893

## 2009-05-20 MED ADMIN — thiamine (B-1) 100 mg in 0.9% sodium chloride 50 mL IVPB: INTRAVENOUS | @ 14:00:00 | NDC 63323001302

## 2009-05-20 MED FILL — HEPARIN LOCK FLUSH (PORCINE) (PF) 100 UNIT/ML INTRAVENOUS SYRINGE: 100 unit/mL | INTRAVENOUS | Qty: 5

## 2009-05-20 MED FILL — SALINE FLUSH INJECTION SYRINGE: INTRAMUSCULAR | Qty: 30

## 2009-05-20 MED FILL — XIFAXAN 200 MG TABLET: 200 mg | ORAL | Qty: 2

## 2009-05-20 MED FILL — SALINE FLUSH INJECTION SYRINGE: INTRAMUSCULAR | Qty: 10

## 2009-05-20 MED FILL — ALBUTEROL SULFATE 0.083 % (0.83 MG/ML) SOLN FOR INHALATION: 2.5 mg /3 mL (0.083 %) | RESPIRATORY_TRACT | Qty: 1

## 2009-05-20 MED FILL — ASPIRIN 81 MG CHEWABLE TAB: 81 mg | ORAL | Qty: 1

## 2009-05-20 MED FILL — METOPROLOL TARTRATE 25 MG TAB: 25 mg | ORAL | Qty: 1

## 2009-05-20 MED FILL — PROTONIX 40 MG TABLET,DELAYED RELEASE: 40 mg | ORAL | Qty: 1

## 2009-05-20 MED FILL — ZOSYN 3.375 GRAM INTRAVENOUS SOLUTION: 3.375 gram | INTRAVENOUS | Qty: 3.38

## 2009-05-20 MED FILL — THIAMINE 100 MG/ML INJECTION: 100 mg/mL | INTRAMUSCULAR | Qty: 1

## 2009-05-20 MED FILL — SALINE FLUSH INJECTION SYRINGE: INTRAMUSCULAR | Qty: 60

## 2009-05-20 NOTE — Progress Notes (Signed)
 Formatting of this note might be different from the original.  Responded to voicemail requesting pastoral visit.  Patient was in good spirits this morning.  She was confused at times, but she clearly articulated a sense of hope for herself.  She expressed gratitude for pastoral support and prayers.     Visit by Truman Cuna, MA, Chaplain   Electronically signed by Cuna Truman A at 05/20/2009 11:44 AM EDT

## 2009-05-20 NOTE — Progress Notes (Signed)
 Formatting of this note might be different from the original.  Agree with assessment documented by Arnaldo Bobbe HATCHER  Electronically signed by Margie Almarie NOVAK, RN at 05/20/2009  3:52 PM EDT

## 2009-05-20 NOTE — Progress Notes (Signed)
 Formatting of this note might be different from the original.  Attempted to see pt for PT re-evaluation. Pt off floor for chest x-ray, per nsg. Will f/u later.   Electronically signed by Sammi Greig DEL, PT, DPT at 05/20/2009  1:47 PM EDT

## 2009-05-20 NOTE — Progress Notes (Signed)
 Formatting of this note might be different from the original.  Patient AxO x4 upon receiving after night shift report. Increased confusion noted later in the morning with sudden outbursts of inappropriate laughter. Will continue to monitor patient's behavior. Arnaldo Early SN3  Electronically signed by Early Arnaldo T at 05/20/2009  1:43 PM EDT
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
## 2009-05-20 NOTE — Progress Notes (Signed)
 Formatting of this note is different from the original.  Problem: Dysphagia (Adult)  Goal: *Acute Goals and Plan of Care (Insert Text)  Speech Therapy Goals  Initiated 05/17/09  1. Patient will tolerate mechanical soft with thin liquids without overt s/s of aspiration within 7 days  2. Patient will tolerate trials of solids without overt s/s of aspiration within 7 days  3. Patient will participate in Integrated Language evaluation within 7 days MET 05/19/09    Initiated 05/10/09; re-evaluated 05/17/09  1. Patient will participate in re-evaluation of swallow within 7 days MET  2. Patient will tolerate least restrictive diet without signs or symptoms of aspiration within 7 days NOT MET  3. Patient will participate in MBS as needed/appropriate within 7 days NOT MET; not needed   SPEECH LANGUAGE PATHOLOGY DYSPHAGIA TREATMENT    NAME : Breanna Lester AGE: 48 y.o.  GENDER: female  DATE: 05/20/2009  PRIMARY DIAGNOSIS: AMS, hypokalemia, lft upper extremity pain  AMS, hypokalemia, lft upper extremity pain  AMS, hypokalemia, lft upper extremity pain  Colonoscopy  <principal problem not specified>  Procedure(s) (LRB):  ESOPHAGOGASTRODUODENOSCOPY (EGD) (N/A)  ESOPHAGOGASTRODUODENAL (EGD) BIOPSY (N/A)  3 Days Post-Op     SUBJECTIVE:   Patient stated ? Not right now.  A little boy just died and everyone thinks it's my fault  ?[name]    OBJECTIVE:   Cognitive and Communication Status:  Level of Consciousness: Alert;Confused  Cognition: Appropriate for age attention/concentration  Perception: Appears intact  Perseveration: No perseveration noted  Safety/Judgement: Decreased awareness of need for assistance;Decreased awareness of need for safety    Dysphagia Treatment:  Oral Assessment:  Oral Assessment  Labial: No impairment  Dentition: Extractions;Intact  Oral Hygiene: Moist oral mucosa  Lingual: No impairment  Velum: No impairment  Gag Reflex: Present (with touch to posterior pharyngeal wall)     P.O. Trials:  Patient Position: Upright  in bed    The patient was given the following:  Consistency Presented: Solid;Thin liquid  How Presented: Self-fed/presented;Cup/sip    ORAL PHASE:  Bolus Acceptance: No impairment  Bolus Formation/Control: No impairment  Propulsion: No impairment  Type of Impairment:  (chewed ice slowly)  Oral Residue: None    PHARYNGEAL PHASE:  Initiation of Swallow: No impairment  Laryngeal Elevation: Functional  Aspiration Signs/Symptoms: None  Vocal Quality: No impairment  Cues for Modifications: Minimal  Effective Modifications: Small sips and bites;Other (comment) (Single bites)    After treatment:  [X]    Patient left in NAD  [X]    Call bell left within reach  [ ]    Caregiver present  [ ]    Patient left up in chair, nursing staff notified    ASSESSMENT AND PROGRESSION TOWARD GOALS:   Patient tolerated minimal trials solid and thin liquids with no overt s/s aspiration and moderate cues for single bites.  Function limited by mental status.    Patient?s progression toward goals is as follows:  [ ]    Improving appropriately and progressing toward goals  [X]    Improving slowly and progressing toward goals  [ ]    Not making progress toward goals and plan of care will be adjusted    PLAN OF CARE:   Patient continues to benefit from skilled intervention to address the above impairments.    Recommendations and Planned Interventions:    Any new recommendations or planned interventions including recommended diet changes were discussed with:  [X]   Patient   [ ]    Family      [ ]   Nursing    [ ]    Provider  [ ]   Posted safety precautions in patient?s room    Communication/Collaboration:    Wonda GORMAN Southerly, SLP    Time Calculation: 10 mins          Electronically signed by Southerly Wonda GORMAN, SLP at 05/20/2009  5:19 PM EDT

## 2009-05-20 NOTE — Progress Notes (Signed)
 Formatting of this note is different from the original.  Gastroenterology Progress Note    05/20/2009    Admit Date: 04/29/2009    Subjective:     Follow up for:  1)  cirrhosis    2) anemia    Patient is on Regular.    Pain: Patient complains of abdominal pain no.    Bowel Movements: +    There is some bright red rectal  bleeding    Current facility-administered medications   Medication Dose Route Frequency   ? albuterol /ipratropium (DUONEB ) neb solution   1 Dose Nebulization Q4H PRN   ? lorazepam (ATIVAN) injection 1 mg  1 mg IntraVENous Q4H PRN   ? TPN ADULT - PERIPHERAL   1,512 mL IntraVENous CONTINUOUS   ? pantoprazole  (PROTONIX ) tablet 40 mg  40 mg Oral ACB   ? dextrose 5 % - 0.9% NaCl infusion    IntraVENous CONTINUOUS   ? rifaximin (XIFAXAN) tablet 400 mg  400 mg Oral TID   ? aspirin chewable tablet 81 mg  81 mg Oral DAILY   ? metoprolol  (LOPRESSOR ) tablet 25 mg  25 mg Oral Q12H   ? DISCONTD: piperacillin-tazobactam (ZOSYN) 3.375 g in 0.9% sodium chloride  (MBP/ADV) 100 mL MBP  3.375 g IntraVENous Q6H   ? albumin human 5% (BUMINATE) solution 25 g  25 g IntraVENous Q4H PRN   ? sodium chloride  (NS) flush 10 mL  10 mL InterCATHeter Q8H   ? sodium chloride  (NS) flush 10 mL  10 mL InterCATHeter PRN   ? heparin (porcine) pf 300 Units  300 Units InterCATHeter PRN   ? sodium chloride  (NS) flush 20 mL  20 mL InterCATHeter PRN   ? DISCONTD: albuterol /ipratropium (DUONEB ) neb solution   1 Dose Nebulization Q4HWA RT   ? acetaminophen  (TYLENOL ) suppository 650 mg  650 mg Rectal Q6H PRN   ? DISCONTD: albuterol  (PROVENTIL  VENTOLIN ) nebulizer solution 2.5 mg  2.5 mg Nebulization Q2H PRN   ? DISCONTD: thiamine (B-1) 100 mg in 0.9% sodium chloride  50 mL IVPB  100 mg IntraVENous DAILY   ? saline peripheral flush 5 mL  5 mL InterCATHeter PRN         Objective:     Blood pressure 119/63, pulse 76, temperature 98.5 F (36.9 C), resp. rate 18, height 5' 2 (1.575 m), weight 144 lb 13.5 oz (65.7 kg), SpO2 95.00%, peak flow 40  L/min.    In: 360 (360 P.O.)  Out: 900 (900 Urine)      In: 1742.6 (1742.6 I.V.)  Out: 1100 (1100 Urine)      Jaundiced  Chronically ill appearing  Alert and oriented  Spleen is enlarged  Umbilical hernia    Data Review  Recent Results (from the past 24 hour(s))   GLUCOSE, POC    Collection Time    05/19/09  5:37 PM   Component Value Range   ? POC GLUCOSE 102  65 - 105 (mg/dL)   GLUCOSE, POC    Collection Time    05/19/09 10:25 PM   Component Value Range   ? POC GLUCOSE 97  65 - 105 (mg/dL)   GLUCOSE, POC    Collection Time    05/20/09  8:15 AM   Component Value Range   ? POC GLUCOSE 96  65 - 105 (mg/dL)   GLUCOSE, POC    Collection Time    05/20/09 11:52 AM   Component Value Range   ? POC GLUCOSE 216 (*) 65 - 105 (mg/dL)  GLUCOSE, POC    Collection Time    05/20/09 12:18 PM   Component Value Range   ? POC GLUCOSE 103  65 - 105 (mg/dL)         Assessment:     Patient Active Hospital Problem List:  Nausea with vomiting (05/01/2009)    Hypokalemia (05/01/2009)    Hepatic encephalopathy (05/01/2009)    Gait abnormality (05/01/2009)    Alcoholic cirrhosis of liver (05/01/2009)    Hypomagnesemia (05/01/2009)    Plan:     I agree with DC plans.  She should follow up with transplant clinic at [facility]  I am happy to see her in 4-6 weeks    Levander PHEBE Angle, MD  2:15 PM  05/20/2009      Electronically signed by Angle Levander FALCON, MD at 05/20/2009  2:15 PM EDT

## 2009-05-20 NOTE — Progress Notes (Signed)
 Formatting of this note is different from the original.       Hospitalist Progress Note         NAME: Breanna Lester        DOB:  [birth year]        MRN:  [id number]      Assessment & Plan   Sepsis syndrome :underlying pneumonia, fever, leukocytosis, resolved.   off vanco and LVQ.   Continue Zosyn to complete 14 day course, d/c today.  No evidence of UTI or ascites(SBP), has an umbilical hernia that is not     reducible but overall adbomen is soft,   No diarrhea/ off lactulose  F/u cxr    Altered mental status : multifactorial Sepsis, hypernatremia, ARF, hyperamonemia, Hepatic encephalopathy resolving. Today seems much better, hallucinations not evidenced.    Hepatic encephalopathy on rifaximin now, GI input appreciated. Hx cirrhosis.Taken off lactulose because of diarrhea, still has rectal tube in place. Improving.  Will eventually need to resume her diuretics.    NSTEMI:on ASA and beta-blockers, Received  Lovenox  earlier,   Cardiology in the case, ischemic workup  as outpatient    Anemia, s/p prbc, s/p egd with no source of bleed and path benign: egd revealed Schatzki's ring/ HH.  Continue ppi.    Hypothyroidism(?) tsh suppressed < 0.01, discontinued levothyroxine, recheck tsh remains suppressed but Ft4 upper limit nl, stopped levothyroxine 9/28.    Gait abnormality / generalized debility  B12, folate normal, PT/OT input appreciated.  Will ask [facility] opinion on appropriateness for inpatient rehab.    Nutrition:   tpn discontinued.  Tolerating diet well.    Left wrist Fracture: casted Ortho following, will need to follow outpatient.    Acute respiratory failure:resolved, got extubated doing well off the ventilator    ARF: resolved    Rhabdomyolisis: resolved    Hypernatremia: resolved, now off IVF.    Dispo: snf vs sah vs hhc/home.      Subjective:     ADMIT [name] is a 48 y.o. Caucasian female whom presents with complaint of left forearm pain, nausea and vomiting.  Patient with a hx of cirrohsis presents ambulatory to  ED with cc of vomiting. Pt's relative reports nausea and vomiting all day. Per family member, when pt experiences N/V secondary to cirrhosis, it usually is associated with a fever. Pt's relative reports that pt fractured her ulna and radius 2 days ago after falling and that fractures were never set, just wrapped. Pt's relative also notes that pt's vision has been worsening, indicating this as the reason she fell 2 days ago   We were asked to admit for work up and evaluation of the above problems.       Chief Complaint: seems much more lucid today, denies hallucinations and no recollection of them. Tolerating PO well she says, no specific complaints    Review of Systems:  Fever/chills N   Cough y   Sputum    y   SOB/DOE  N   Chest Pain N   Abdominal Pain N   Diarrhea N   Constipation N   Nausea/Vomiting N   Dysuria N   Tolerating PT y   Tolerating Diet y   Could not obtain due to AMS vs intubation        Objective:     VITALS:   Last 24hrs VS reviewed since prior progress note. Most recent are:  Visit Vitals   Item Reading   ? BP 119/63   ? Pulse  76   ? Temp 98.5 F (36.9 C)   ? Resp 18   ? Ht 5' 2 (1.575 m)   ? Wt 144 lb 13.5 oz (65.7 kg)   ? SpO2 95%   ? PF 40L/min     Intake/Output Summary (Last 24 hours) at 05/20/09 1047  Last data filed at 05/20/09 0940   Gross per 24 hour   Intake    460 ml   Output    500 ml   Net    -40 ml       Telemetry Reviewed:     PHYSICAL EXAM:  General:  awake , nad, cooperative   HEENT: Pupils unequal, R 3mm, L 2mm, eomi  Lungs:             Essentially clear and non-labored.  Heart:  Regular rhythm, tachycardic, SEM  Abdomen: Soft, Non distended, hypoactive BS,RUQ pain,  no rebound, umbilical hernia non reducible & NT.  Extremities: No edema, pulses present, brace in left arm  Neurologic: aa0x 3, no gross deficit  Psych: Alert and awake today    Lab Data Reviewed: (see below)    Medications Reviewed: (see below)    PMH/SH reviewed - no change compared to H&P    EGD BX  05/17/09:  1. Duodenum, biopsy:  Normal small intestinal mucosa.  2. Stomach, biopsy:  Benign fundic and antral mucosa with mild reactive gastropathy.  Giemsa stain for Helicobacter pylori is negative.  3. Mid/distal esophagus, biopsy:  Benign squamous mucosa with features suggestive of reflux  esophagitis.  No evidence of eosinophilic esophagitis or Barrett's.  ______________________________________________________________________      Care Plan discussed with:  Patient x   Family    RN x   Care Manager    Consultant/Specialist      >50% of visit spent in counseling and coordination of care y      Prophylaxis:  GI PPI   DVT SCD     Disposition:   Home with Family    HH/PT/OT/RN    SNF/LTC x   [facility]      ______________________________________________________________________  Attending Physician: Jesus A. Chandra, MD   _____________________________________________________________________________________________________  Procedures: see electronic medical records for all procedures/Xrays and details  which were not copied into this note but were reviewed prior to creation of Plan.      LABS:  Recent Labs   Basename 05/19/09 0355 05/18/09 0255   ? WBC 5.2 6.8   ? HGB 8.2* 7.8*   ? HCT 26.0* 24.9*   ? PLT 87* 87*     Recent Labs   Basename 05/19/09 0355 05/18/09 0255   ? NA 140 136   ? K 3.9 4.4   ? CL 107 103   ? CO2 26 22   ? BUN 10 9   ? CREA 0.7 0.8   ? GLU 107* 216*   ? CA 8.9 9.0   ? MG -- --   ? PHOS -- --   ? URICA -- --     Recent Labs   Basename 05/18/09 0255   ? SGOT 24   ? GPT 28   ? AP 118   ? TBIL 1.5*   ? TP 6.2*   ? ALB 2.0*   ? GLOB 4.2*   ? GGT --   ? AML --   ? LPSE --     No results found for this basename: INR:3,PTP:3,APTT:3, in the last 72 hours     No  results found for this basename: FE:2,TIBC:2,PSAT:2,FERR:2, in the last 72 hours     No results found for this basename: PH:2,PCO2:2,PO2:2, in the last 72 hours    No results found for this basename: CPK:3,CKMB:3,TROPONINI:3, in the last 72 hours    Lab  Results   Component Value Date/Time    POC GLUCOSE 96 05/20/2009  8:15 AM    POC GLUCOSE 97 05/19/2009 10:25 PM    POC GLUCOSE 102 05/19/2009  5:37 PM    POC GLUCOSE 130 05/19/2009 11:29 AM    POC GLUCOSE 137 05/19/2009  5:49 AM     Lab Results   Component Value Date/Time    Color DARK YELLOW 05/09/2009 11:05 AM    Appearance CLEAR 05/09/2009 11:05 AM    Specific gravity 1.010 10/29/2008 11:00 AM    Specific gravity 1.024 05/09/2009 11:05 AM    pH 6.0 05/09/2009 11:05 AM    Protein TRACE 05/09/2009 11:05 AM    Glucose NEGATIVE  05/09/2009 11:05 AM    Ketone NEGATIVE  05/09/2009 11:05 AM    Bilirubin NEGATIVE  04/30/2009 12:14 AM    Urobilinogen 0.2 05/09/2009 11:05 AM    Nitrites NEGATIVE  05/09/2009 11:05 AM    Leukocyte Esterase SMALL 05/09/2009 11:05 AM    Epithelial cells 0-5 05/09/2009 11:05 AM    Bacteria NEGATIVE  05/09/2009 11:05 AM    WBC 5-10 05/09/2009 11:05 AM    RBC 10-20 05/09/2009 11:05 AM     MEDICATIONS:  Current facility-administered medications   Medication Dose Route Frequency   ? lorazepam (ATIVAN) injection 1 mg  1 mg IntraVENous Q4H PRN   ? TPN ADULT - PERIPHERAL   1,512 mL IntraVENous CONTINUOUS   ? pantoprazole  (PROTONIX ) tablet 40 mg  40 mg Oral ACB   ? dextrose 5 % - 0.9% NaCl infusion    IntraVENous CONTINUOUS   ? rifaximin (XIFAXAN) tablet 400 mg  400 mg Oral TID   ? aspirin chewable tablet 81 mg  81 mg Oral DAILY   ? metoprolol  (LOPRESSOR ) tablet 25 mg  25 mg Oral Q12H   ? piperacillin-tazobactam (ZOSYN) 3.375 g in 0.9% sodium chloride  (MBP/ADV) 100 mL MBP  3.375 g IntraVENous Q6H   ? albumin human 5% (BUMINATE) solution 25 g  25 g IntraVENous Q4H PRN   ? albuterol /ipratropium (DUONEB ) neb solution   1 Dose Nebulization Q4HWA RT   ? sodium chloride  (NS) flush 10 mL  10 mL InterCATHeter Q8H   ? sodium chloride  (NS) flush 10 mL  10 mL InterCATHeter PRN   ? heparin (porcine) pf 300 Units  300 Units InterCATHeter PRN   ? sodium chloride  (NS) flush 20 mL  20 mL InterCATHeter PRN   ? acetaminophen  (TYLENOL )  suppository 650 mg  650 mg Rectal Q6H PRN   ? albuterol  (PROVENTIL  VENTOLIN ) nebulizer solution 2.5 mg  2.5 mg Nebulization Q2H PRN   ? thiamine (B-1) 100 mg in 0.9% sodium chloride  50 mL IVPB  100 mg IntraVENous DAILY   ? saline peripheral flush 5 mL  5 mL InterCATHeter PRN       Electronically signed by Chandra Byers, MD at 05/20/2009 11:30 AM EDT

## 2009-05-20 NOTE — Progress Notes (Signed)
 Formatting of this note is different from the original.  Nutrition Recommendations:   Continue 2g Na, consistency per SLP    Progress Note:  I ate like an elephant this morning!    Pt with episodes of confusion during discussion (reports she does not have a cast on and that she has been here in the hospital for 6 months), but overall appears to be fair historian at our discussion. Pt reports appetite improving and able to eat food without too much assistance as she is right handed (L hand casted).    Pt expressed concerns about being diabetic as the nsg staff has been taking her BG levels--discussed this being normal for pt's who were receiving PPN and that it is not uncommon for pt's who are not diabetic to have their sugars checked or even to receive insulin  while in the hospital.  Discussed only MDs dx diabetes, so writer encouraged pt to speak with MDs if she has concerns.  Pt verbalized understanding of this, but did express nervousness about speaking with MD about diabetes as she is afraid she will then have diabetes especially with her liver failure.    ______________________________________________________  POC:  Lab Results   Component Value Date/Time    POC GLUCOSE 103 05/20/2009 12:18 PM    POC GLUCOSE 216 05/20/2009 11:52 AM    POC GLUCOSE 96 05/20/2009  8:15 AM    POC GLUCOSE 97 05/19/2009 10:25 PM     Pt's BMI:  Estimated Body mass index is 26.49 kg/(m^2) as calculated from the following:    Height as of this encounter: 5' 2(1.575 m).    Weight as of this encounter: 144 lb 13.5 oz(65.7 kg).  Based on the BMI above, pt is WDL    Weight History: Wt Readings from Last 20 Encounters:   05/05/2009 144 lb 13.5 oz (65.7 kg)   05/05/2009 144 lb 13.5 oz (65.7 kg)   11/06/2008 154 lb (69.854 kg)   10/28/2008 149 lb 12.8 oz (67.949 kg)     ______________________________________________________  Thank you  Morna Solomons, RD  Pager [phone]    Electronically signed by Solomons Morna BROCKS, RD at 05/20/2009   1:03 PM EDT

## 2009-05-20 NOTE — Progress Notes (Signed)
 Formatting of this note might be different from the original.  1211 RUA triple lumen PICC dressing changed.Instilled 300 units of heparin to white port due to no blood return and to dwell for 1 hour.RN staff made aware to check for blood return and if + for blood return , RN to flush line w/ 20 ml saline.Both red and gray ports flushed w/ 20 ml saline and both ports are + for blood return.  Keane Barrier RN/CRNI  Electronically signed by Barrier Keane HERO, RN at 05/20/2009 12:39 PM EDT

## 2009-05-20 NOTE — Progress Notes (Signed)
Hospitalist Progress Note         NAME: Breanna Lester        DOB:  [birth year]        MRN:  [id number]      Assessment & Plan   Sepsis syndrome :underlying pneumonia, fever, leukocytosis, resolved.   off vanco and LVQ.   Continue Zosyn to complete 14 day course, d/c today.  No evidence of UTI or ascites(SBP), has an umbilical hernia that is not     reducible but overall adbomen is soft,   No diarrhea/ off lactulose  F/u cxr    Altered mental status : multifactorial Sepsis, hypernatremia, ARF, hyperamonemia, Hepatic encephalopathy resolving. Today seems much better, hallucinations not evidenced.     Hepatic encephalopathy on rifaximin now, GI input appreciated. Hx cirrhosis.Taken off lactulose because of diarrhea, still has rectal tube in place. Improving.  Will eventually need to resume her diuretics.    NSTEMI:on ASA and beta-blockers, Received  Lovenox earlier,   Cardiology in the case, ischemic workup  as outpatient      Anemia, s/p prbc, s/p egd with no source of bleed and path benign: egd revealed Schatzki's ring/ HH.  Continue ppi.    Hypothyroidism(?) tsh suppressed < 0.01, discontinued levothyroxine, recheck tsh remains suppressed but Ft4 upper limit nl, stopped levothyroxine 9/28.    Gait abnormality / generalized debility  B12, folate normal, PT/OT input appreciated.  Will ask [facility] opinion on appropriateness for inpatient rehab.    Nutrition:   tpn discontinued.  Tolerating diet well.    Left wrist Fracture: casted Ortho following, will need to follow outpatient.    Acute respiratory failure:resolved, got extubated doing well off the ventilator    ARF: resolved    Rhabdomyolisis: resolved    Hypernatremia: resolved, now off IVF.    Dispo: snf vs sah vs hhc/home.         Subjective:     ADMIT Breanna Lester is a 48 y.o.?? Caucasian female whom presents with complaint of left forearm pain, nausea and vomiting.   Patient with a hx of cirrohsis presents ambulatory to ED with cc of vomiting. Pt's relative reports nausea and vomiting "all day".?? Per family member, when pt experiences N/V secondary to cirrhosis, it usually is associated with a fever. Pt's relative reports that pt fractured her ulna and radius 2 days ago after falling and that fractures were never "set", just "wrapped". Pt's relative also notes that pt's vision has been worsening, indicating this as the reason she fell 2 days ago   We were asked to admit for work up and evaluation of the above problems.   ????    Chief Complaint: seems much more lucid today, denies hallucinations and no recollection of them. Tolerating PO well she says, no specific complaints    Review of Systems:  Fever/chills N   Cough y   Sputum    y   SOB/DOE  N   Chest Pain N   Abdominal Pain N   Diarrhea N   Constipation N   Nausea/Vomiting N   Dysuria N   Tolerating PT y   Tolerating Diet y   Could not obtain due to AMS vs intubation         Objective:       VITALS:   Last 24hrs VS reviewed since prior progress note. Most recent are:  Visit Vitals   Item Reading   ??? BP 119/63   ??? Pulse 76   ??? Temp 98.5 ??  F (36.9 ??C)   ??? Resp 18   ??? Ht 5\' 2"  (1.575 m)   ??? Wt 144 lb 13.5 oz (65.7 kg)   ??? SpO2 95%   ??? PF 40L/min           Intake/Output Summary (Last 24 hours) at 05/20/09 1047  Last data filed at 05/20/09 0940   Gross per 24 hour   Intake    460 ml   Output    500 ml   Net    -40 ml        Telemetry Reviewed:     PHYSICAL EXAM:  General:  awake , nad, cooperative   HEENT: Pupils unequal, R 3mm, L 2mm, eomi  Lungs:             Essentially clear and non-labored.  Heart:  Regular rhythm, tachycardic, SEM  Abdomen: Soft, Non distended, hypoactive BS,RUQ pain,  no rebound, umbilical hernia non reducible & NT.  Extremities: No edema, pulses present, brace in left arm  Neurologic:?? aa0x 3, no gross deficit  Psych:???? Alert and awake today    Lab Data Reviewed: (see below)     Medications Reviewed: (see below)    PMH/SH reviewed - no change compared to H&P    EGD BX 05/17/09:  1. Duodenum, biopsy:  Normal small intestinal mucosa.  2. Stomach, biopsy:  Benign fundic and antral mucosa with mild reactive gastropathy.  Giemsa stain for Helicobacter pylori is negative.  3. Mid/distal esophagus, biopsy:  Benign squamous mucosa with features suggestive of reflux  esophagitis.  No evidence of eosinophilic esophagitis or Barrett's.  ______________________________________________________________________        Care Plan discussed with:  Patient x   Family    RN x   Care Manager    Consultant/Specialist      >50% of visit spent in counseling and coordination of care y      Prophylaxis:  GI PPI   DVT SCD     Disposition:   Home with Family    HH/PT/OT/RN    SNF/LTC x   [facility]      ______________________________________________________________________  Attending Physician: Demitria Hay A. Bradly Bienenstock, MD   _____________________________________________________________________________________________________  Procedures: see electronic medical records for all procedures/Xrays and details  which were not copied into this note but were reviewed prior to creation of Plan.      LABS:  Recent Labs   Basename 05/19/09 0355 05/18/09 0255   ??? WBC 5.2 6.8   ??? HGB 8.2* 7.8*   ??? HCT 26.0* 24.9*   ??? PLT 87* 87*         Recent Labs   Basename 05/19/09 0355 05/18/09 0255   ??? NA 140 136   ??? K 3.9 4.4   ??? CL 107 103   ??? CO2 26 22   ??? BUN 10 9   ??? CREA 0.7 0.8   ??? GLU 107* 216*   ??? CA 8.9 9.0   ??? MG -- --   ??? PHOS -- --   ??? URICA -- --         Recent Labs   Basename 05/18/09 0255   ??? SGOT 24   ??? GPT 28   ??? AP 118   ??? TBIL 1.5*   ??? TP 6.2*   ??? ALB 2.0*   ??? GLOB 4.2*   ??? GGT --   ??? AML --   ??? LPSE --  No results found for this basename: INR:3,PTP:3,APTT:3, in the last 72 hours     No results found for this basename: FE:2,TIBC:2,PSAT:2,FERR:2, in the last 72 hours      No results found for this basename: PH:2,PCO2:2,PO2:2, in the last 72 hours      No results found for this basename: CPK:3,CKMB:3,TROPONINI:3, in the last 72 hours    Lab Results   Component Value Date/Time    POC GLUCOSE 96 05/20/2009  8:15 AM    POC GLUCOSE 97 05/19/2009 10:25 PM    POC GLUCOSE 102 05/19/2009  5:37 PM    POC GLUCOSE 130 05/19/2009 11:29 AM    POC GLUCOSE 137 05/19/2009  5:49 AM         Lab Results   Component Value Date/Time    Color DARK YELLOW 05/09/2009 11:05 AM    Appearance CLEAR 05/09/2009 11:05 AM    Specific gravity 1.010 10/29/2008 11:00 AM    Specific gravity 1.024 05/09/2009 11:05 AM    pH 6.0 05/09/2009 11:05 AM    Protein TRACE 05/09/2009 11:05 AM    Glucose NEGATIVE  05/09/2009 11:05 AM    Ketone NEGATIVE  05/09/2009 11:05 AM    Bilirubin NEGATIVE  04/30/2009 12:14 AM    Urobilinogen 0.2 05/09/2009 11:05 AM    Nitrites NEGATIVE  05/09/2009 11:05 AM    Leukocyte Esterase SMALL 05/09/2009 11:05 AM    Epithelial cells 0-5 05/09/2009 11:05 AM    Bacteria NEGATIVE  05/09/2009 11:05 AM    WBC 5-10 05/09/2009 11:05 AM    RBC 10-20 05/09/2009 11:05 AM           MEDICATIONS:  Current facility-administered medications   Medication Dose Route Frequency   ??? lorazepam (ATIVAN) injection 1 mg  1 mg IntraVENous Q4H PRN   ??? TPN ADULT - PERIPHERAL   1,512 mL IntraVENous CONTINUOUS   ??? pantoprazole (PROTONIX) tablet 40 mg  40 mg Oral ACB   ??? dextrose 5 % - 0.9% NaCl infusion    IntraVENous CONTINUOUS   ??? rifaximin (XIFAXAN) tablet 400 mg  400 mg Oral TID   ??? aspirin chewable tablet 81 mg  81 mg Oral DAILY   ??? metoprolol (LOPRESSOR) tablet 25 mg  25 mg Oral Q12H   ??? piperacillin-tazobactam (ZOSYN) 3.375 g in 0.9% sodium chloride (MBP/ADV) 100 mL MBP  3.375 g IntraVENous Q6H   ??? albumin human 5% (BUMINATE) solution 25 g  25 g IntraVENous Q4H PRN   ??? albuterol/ipratropium (DUONEB) neb solution   1 Dose Nebulization Q4HWA RT   ??? sodium chloride (NS) flush 10 mL  10 mL InterCATHeter Q8H    ??? sodium chloride (NS) flush 10 mL  10 mL InterCATHeter PRN   ??? heparin (porcine) pf 300 Units  300 Units InterCATHeter PRN   ??? sodium chloride (NS) flush 20 mL  20 mL InterCATHeter PRN   ??? acetaminophen (TYLENOL) suppository 650 mg  650 mg Rectal Q6H PRN   ??? albuterol (PROVENTIL VENTOLIN) nebulizer solution 2.5 mg  2.5 mg Nebulization Q2H PRN   ??? thiamine (B-1) 100 mg in 0.9% sodium chloride 50 mL IVPB  100 mg IntraVENous DAILY   ??? saline peripheral flush 5 mL  5 mL InterCATHeter PRN

## 2009-05-20 NOTE — Progress Notes (Incomplete)
Pt remains tearful and continues to hallucinate,wants to get up to go to [place] because her sister has died.Pt reoriented and supportive care given.

## 2009-05-20 NOTE — Progress Notes (Signed)
Problem: Dysphagia (Adult)  Goal: *Acute Goals and Plan of Care (Insert Text)  Speech Therapy Goals  Initiated 05/17/09  1. Patient will tolerate mechanical soft with thin liquids without overt s/s of aspiration within 7 days  2. Patient will tolerate trials of solids without overt s/s of aspiration within 7 days  3. Patient will participate in Integrated Language evaluation within 7 days MET 05/19/09    Initiated 05/10/09; re-evaluated 05/17/09  1. Patient will participate in re-evaluation of swallow within 7 days MET  2. Patient will tolerate least restrictive diet without signs or symptoms of aspiration within 7 days NOT MET  3. Patient will participate in MBS as needed/appropriate within 7 days NOT MET; not needed   SPEECH LANGUAGE PATHOLOGY DYSPHAGIA TREATMENT    NAME : [name] AGE: 48 y.o.  GENDER: female  DATE: 05/20/2009  PRIMARY DIAGNOSIS: AMS, hypokalemia, lft upper extremity pain  AMS, hypokalemia, lft upper extremity pain  AMS, hypokalemia, lft upper extremity pain  Colonoscopy  <principal problem not specified>  Procedure(s) (LRB):  ESOPHAGOGASTRODUODENOSCOPY (EGD) (N/A)  ESOPHAGOGASTRODUODENAL (EGD) BIOPSY (N/A)  3 Days Post-Op       SUBJECTIVE:   Patient stated ??? Not right now.  A little boy just died and everyone thinks it's my fault  ???.     OBJECTIVE:   Cognitive and Communication Status:  Level of Consciousness: Alert;Confused  Cognition: Appropriate for age attention/concentration  Perception: Appears intact  Perseveration: No perseveration noted  Safety/Judgement: Decreased awareness of need for assistance;Decreased awareness of need for safety    Dysphagia Treatment:  Oral Assessment:  Oral Assessment  Labial: No impairment  Dentition: Extractions;Intact  Oral Hygiene: Moist oral mucosa  Lingual: No impairment  Velum: No impairment  Gag Reflex: Present (with touch to posterior pharyngeal wall)     P.O. Trials:  Patient Position: Upright in bed    The patient was given the following:   Consistency Presented: Solid;Thin liquid  How Presented: Self-fed/presented;Cup/sip    ORAL PHASE:  Bolus Acceptance: No impairment  Bolus Formation/Control: No impairment  Propulsion: No impairment  Type of Impairment:  (chewed ice slowly)  Oral Residue: None    PHARYNGEAL PHASE:  Initiation of Swallow: No impairment  Laryngeal Elevation: Functional  Aspiration Signs/Symptoms: None  Vocal Quality: No impairment  Cues for Modifications: Minimal  Effective Modifications: Small sips and bites;Other (comment) (Single bites)    After treatment:  [X]    Patient left in NAD  [X]    Call bell left within reach  [ ]    Caregiver present  [ ]    Patient left up in chair, nursing staff notified      ASSESSMENT AND PROGRESSION TOWARD GOALS:   Patient tolerated minimal trials solid and thin liquids with no overt s/s aspiration and moderate cues for single bites.  Function limited by mental status.    Patient???s progression toward goals is as follows:  [ ]    Improving appropriately and progressing toward goals  [X]    Improving slowly and progressing toward goals  [ ]    Not making progress toward goals and plan of care will be adjusted      PLAN OF CARE:   Patient continues to benefit from skilled intervention to address the above impairments.    Recommendations and Planned Interventions:    Any new recommendations or planned interventions including recommended diet changes were discussed with:  [X]   Patient   [ ]    Family      [ ]   Nursing    [ ]    Provider  [ ]   Posted safety precautions in patient???s room    Communication/Collaboration:      Aneta Mins, SLP    Time Calculation: 10 mins

## 2009-05-20 NOTE — Progress Notes (Signed)
Attempted to see pt for PT re-evaluation. Pt off floor for chest x-ray, per nsg. Will f/u later.

## 2009-05-20 NOTE — Progress Notes (Signed)
Gastroenterology Progress Note    05/20/2009    Admit Date: 04/29/2009    Subjective:     Follow up for:  1)  cirrhosis    2) anemia    Patient is on Regular.    Pain: Patient complains of abdominal pain no.    Bowel Movements: +    There is some bright red rectal  bleeding            Current facility-administered medications   Medication Dose Route Frequency   ??? albuterol/ipratropium (DUONEB) neb solution   1 Dose Nebulization Q4H PRN   ??? lorazepam (ATIVAN) injection 1 mg  1 mg IntraVENous Q4H PRN   ??? TPN ADULT - PERIPHERAL   1,512 mL IntraVENous CONTINUOUS   ??? pantoprazole (PROTONIX) tablet 40 mg  40 mg Oral ACB   ??? dextrose 5 % - 0.9% NaCl infusion    IntraVENous CONTINUOUS   ??? rifaximin (XIFAXAN) tablet 400 mg  400 mg Oral TID   ??? aspirin chewable tablet 81 mg  81 mg Oral DAILY   ??? metoprolol (LOPRESSOR) tablet 25 mg  25 mg Oral Q12H   ??? DISCONTD: piperacillin-tazobactam (ZOSYN) 3.375 g in 0.9% sodium chloride (MBP/ADV) 100 mL MBP  3.375 g IntraVENous Q6H   ??? albumin human 5% (BUMINATE) solution 25 g  25 g IntraVENous Q4H PRN   ??? sodium chloride (NS) flush 10 mL  10 mL InterCATHeter Q8H   ??? sodium chloride (NS) flush 10 mL  10 mL InterCATHeter PRN   ??? heparin (porcine) pf 300 Units  300 Units InterCATHeter PRN   ??? sodium chloride (NS) flush 20 mL  20 mL InterCATHeter PRN   ??? DISCONTD: albuterol/ipratropium (DUONEB) neb solution   1 Dose Nebulization Q4HWA RT   ??? acetaminophen (TYLENOL) suppository 650 mg  650 mg Rectal Q6H PRN   ??? DISCONTD: albuterol (PROVENTIL VENTOLIN) nebulizer solution 2.5 mg  2.5 mg Nebulization Q2H PRN   ??? DISCONTD: thiamine (B-1) 100 mg in 0.9% sodium chloride 50 mL IVPB  100 mg IntraVENous DAILY   ??? saline peripheral flush 5 mL  5 mL InterCATHeter PRN          Objective:     Blood pressure 119/63, pulse 76, temperature 98.5 ??F (36.9 ??C), resp. rate 18, height 5\' 2"  (1.575 m), weight 144 lb 13.5 oz (65.7 kg), SpO2 95.00%, peak flow 40 L/min.    In: 360 (360 P.O.)  Out: 900 (900 Urine)       In: 1742.6 (1742.6 I.V.)  Out: 1100 (1100 Urine)      Jaundiced  Chronically ill appearing  Alert and oriented  Spleen is enlarged  Umbilical hernia    Data Review  Recent Results (from the past 24 hour(s))   GLUCOSE, POC    Collection Time    05/19/09  5:37 PM   Component Value Range   ??? POC GLUCOSE 102  65 - 105 (mg/dL)   GLUCOSE, POC    Collection Time    05/19/09 10:25 PM   Component Value Range   ??? POC GLUCOSE 97  65 - 105 (mg/dL)   GLUCOSE, POC    Collection Time    05/20/09  8:15 AM   Component Value Range   ??? POC GLUCOSE 96  65 - 105 (mg/dL)   GLUCOSE, POC    Collection Time    05/20/09 11:52 AM   Component Value Range   ??? POC GLUCOSE 216 (*) 65 - 105 (mg/dL)  GLUCOSE, POC    Collection Time    05/20/09 12:18 PM   Component Value Range   ??? POC GLUCOSE 103  65 - 105 (mg/dL)             Assessment:     Patient Active Hospital Problem List:  Nausea with vomiting (05/01/2009)    Hypokalemia (05/01/2009)    Hepatic encephalopathy (05/01/2009)    Gait abnormality (05/01/2009)    Alcoholic cirrhosis of liver (05/01/2009)    Hypomagnesemia (05/01/2009)      Plan:     I agree with DC plans.  She should follow up with transplant clinic at [facility]  I am happy to see her in 4-6 weeks    [name], MD  2:15 PM  05/20/2009

## 2009-05-20 NOTE — Progress Notes (Signed)
Agree with assessment documented by Kendall Hrabak SN3

## 2009-05-20 NOTE — Progress Notes (Signed)
1211 RUA triple lumen PICC dressing changed.Instilled 300 units of heparin to white port due to no blood return and to dwell for 1 hour.RN staff made aware to check for blood return and if + for blood return , RN to flush line w/ 20 ml saline.Both red and gray ports flushed w/ 20 ml saline and both ports are + for blood return.  Meriam Rucker RN/CRNI
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
## 2009-05-20 NOTE — Progress Notes (Signed)
Patient AxO x4 upon receiving after night shift report. Increased confusion noted later in the morning with sudden outbursts of inappropriate laughter. Will continue to monitor patient's behavior. Sedalia Muta SN3

## 2009-05-20 NOTE — Progress Notes (Signed)
Responded to voicemail requesting pastoral visit.  Patient was in good spirits this morning.  She was confused at times, but she clearly articulated a sense of hope for herself.  She expressed gratitude for pastoral support and prayers.     Visit by Delma Post, MA, Chaplain

## 2009-05-20 NOTE — Progress Notes (Signed)
Nutrition Recommendations:  ?? Continue 2g Na, consistency per SLP    Progress Note:  "I ate like an elephant this morning!"    Pt with episodes of confusion during discussion (reports she does not have a cast on and that she has been here in the hospital for 6 months), but overall appears to be fair historian at our discussion. Pt reports appetite improving and able to eat food without too much assistance as she is right handed (L hand casted).    Pt expressed concerns about being "diabetic" as the nsg staff has been taking her BG levels--discussed this being normal for pt's who were receiving PPN and that it is not uncommon for pt's who are not diabetic to have their sugars checked or even to receive insulin while in the hospital.  Discussed only MDs dx diabetes, so writer encouraged pt to speak with MDs if she has concerns.  Pt verbalized understanding of this, but did express nervousness about speaking with MD about diabetes as she is afraid she will then "have" diabetes especially with her liver failure.    ______________________________________________________  POC:  Lab Results   Component Value Date/Time    POC GLUCOSE 103 05/20/2009 12:18 PM    POC GLUCOSE 216 05/20/2009 11:52 AM    POC GLUCOSE 96 05/20/2009  8:15 AM    POC GLUCOSE 97 05/19/2009 10:25 PM     Pt's BMI:  Estimated Body mass index is 26.49 kg/(m^2) as calculated from the following:    Height as of this encounter: 5\' 2" (1.575 m).    Weight as of this encounter: 144 lb 13.5 oz(65.7 kg).  Based on the BMI above, pt is WDL    Weight History: Wt Readings from Last 20 Encounters:   05/05/2009 144 lb 13.5 oz (65.7 kg)   05/05/2009 144 lb 13.5 oz (65.7 kg)   11/06/2008 154 lb (69.854 kg)   10/28/2008 149 lb 12.8 oz (67.949 kg)     ______________________________________________________  Thank you  Sonny Dandy, RD  Pager [phone]

## 2009-05-21 LAB — METABOLIC PANEL, BASIC
Anion gap: 12 mmol/L (ref 5–15)
BUN/Creatinine ratio: 10 — ABNORMAL LOW (ref 12–20)
BUN: 7 MG/DL (ref 6–20)
CO2: 24 MMOL/L (ref 21–32)
Calcium: 9.2 MG/DL (ref 8.5–10.1)
Chloride: 104 MMOL/L (ref 97–108)
Creatinine: 0.7 MG/DL (ref 0.6–1.3)
GFR est AA: >60 mL/min/{1.73_m2} (ref >60)
GFR est non-AA: >60 mL/min/{1.73_m2} (ref >60)
Glucose: 88 MG/DL (ref 65–100)
Potassium: 3.5 MMOL/L (ref 3.5–5.1)
Sodium: 140 MMOL/L (ref 136–145)

## 2009-05-21 LAB — CBC WITH AUTOMATED DIFF
ABS. BASOPHILS: 0 10*3/uL (ref 0.0–0.1)
ABS. EOSINOPHILS: 0.1 10*3/uL (ref 0.0–0.4)
ABS. LYMPHOCYTES: 1.6 10*3/uL (ref 0.8–3.5)
ABS. MONOCYTES: 0.4 10*3/uL (ref 0.0–1.0)
ABS. NEUTROPHILS: 2.6 10*3/uL (ref 1.8–8.0)
BASOPHILS: 1 % (ref 0–1)
EOSINOPHILS: 3 % (ref 0–7)
HCT: 25.9 % — ABNORMAL LOW (ref 35.0–47.0)
HGB: 7.9 g/dL — ABNORMAL LOW (ref 11.5–16.0)
LYMPHOCYTES: 33 % (ref 12–49)
MCH: 27.3 PG (ref 26.0–34.0)
MCHC: 30.5 g/dL (ref 30.0–36.5)
MCV: 89.6 FL (ref 80.0–99.0)
MONOCYTES: 9 % (ref 5–13)
NEUTROPHILS: 54 % (ref 32–75)
PLATELET: 91 10*3/uL — ABNORMAL LOW (ref 150–400)
RBC: 2.89 M/uL — ABNORMAL LOW (ref 3.80–5.20)
RDW: 20.6 % — ABNORMAL HIGH (ref 11.5–14.5)
WBC: 4.7 10*3/uL (ref 3.6–11.0)

## 2009-05-21 LAB — GLUCOSE, POC
Glucose (POC): 97 mg/dL (ref 65–105)
Glucose (POC): 96 mg/dL (ref 65–105)
Glucose (POC): 90 mg/dL (ref 65–105)

## 2009-05-21 MED ADMIN — rifaximin (XIFAXAN) tablet 400 mg: ORAL | @ 17:00:00 | NDC 65649030105

## 2009-05-21 MED ADMIN — aspirin chewable tablet 81 mg: ORAL | @ 17:00:00

## 2009-05-21 MED ADMIN — rifaximin (XIFAXAN) tablet 400 mg: ORAL | @ 20:00:00 | NDC 65649030105

## 2009-05-21 MED ADMIN — metoprolol (LOPRESSOR) tablet 25 mg: ORAL | @ 17:00:00 | NDC 51079025501

## 2009-05-21 MED ADMIN — sodium chloride (NS) flush 10 mL: @ 10:00:00 | NDC 87701099893

## 2009-05-21 MED ADMIN — lorazepam (ATIVAN) injection 1 mg: INTRAVENOUS | @ 02:00:00 | NDC 10019010239

## 2009-05-21 MED ADMIN — pantoprazole (PROTONIX) tablet 40 mg: ORAL | @ 10:00:00 | NDC 00008084181

## 2009-05-21 MED ADMIN — sodium chloride (NS) flush 10 mL: @ 02:00:00 | NDC 87701099893

## 2009-05-21 MED ADMIN — sodium chloride (NS) flush 10 mL: @ 18:00:00 | NDC 58016499501

## 2009-05-21 MED ADMIN — rifaximin (XIFAXAN) tablet 400 mg: ORAL | @ 02:00:00 | NDC 65649030105

## 2009-05-21 MED ADMIN — dextrose 5 % - 0.9% NaCl infusion: INTRAVENOUS | @ 10:00:00 | NDC 00409794109

## 2009-05-21 MED FILL — XIFAXAN 200 MG TABLET: 200 mg | ORAL | Qty: 2

## 2009-05-21 MED FILL — LORAZEPAM 2 MG/ML IJ SOLN: 2 mg/mL | INTRAMUSCULAR | Qty: 1

## 2009-05-21 MED FILL — SALINE FLUSH INJECTION SYRINGE: INTRAMUSCULAR | Qty: 40

## 2009-05-21 MED FILL — ASPIRIN 81 MG CHEWABLE TAB: 81 mg | ORAL | Qty: 1

## 2009-05-21 MED FILL — PROTONIX 40 MG TABLET,DELAYED RELEASE: 40 mg | ORAL | Qty: 1

## 2009-05-21 MED FILL — METOPROLOL TARTRATE 25 MG TAB: 25 mg | ORAL | Qty: 1

## 2009-05-21 MED FILL — SALINE FLUSH INJECTION SYRINGE: INTRAMUSCULAR | Qty: 30

## 2009-05-21 MED FILL — SALINE FLUSH INJECTION SYRINGE: INTRAMUSCULAR | Qty: 20

## 2009-05-21 NOTE — Progress Notes (Signed)
 Formatting of this note is different from the original.       Hospitalist Progress Note         NAME: Breanna Lester        DOB:  1960-12-29        MRN:  [id number]      Assessment & Plan   Sepsis syndrome :underlying pneumonia, fever, leukocytosis, resolved.   off vanco and LVQ.   Off Zosyn  No evidence of UTI or ascites(SBP), has an umbilical hernia that is not     reducible but overall adbomen is soft,   No diarrhea/ off lactulose  F/u cxr    Anemia, s/p prbc, s/p egd with no source of bleed and path benign: egd revealed Schatzki's ring/ HH.  Continue ppi.  Will transfuse if hgb <7.9.    Altered mental status : multifactorial Sepsis, hypernatremia, ARF, hyperamonemia, Hepatic encephalopathy resolving. Much improved, hallucinations not evidenced.    Hepatic encephalopathy on rifaximin now, GI input appreciated. Hx cirrhosis.Taken off lactulose because of diarrhea,  Improved.  Will eventually need to resume her diuretics.    NSTEMI:on ASA and beta-blockers, Received  Lovenox  earlier,   Cardiology followed, ischemic workup  as outpatient    Hypothyroidism(?) tsh suppressed < 0.01, discontinued levothyroxine, recheck tsh remains suppressed but Ft4 upper limit nl, stopped levothyroxine 9/28.  Repeat tsh in 4-6 weeks.    Gait abnormality / generalized debility  B12, folate normal, PT/OT input appreciated.  Will ask [facility] opinion on appropriateness for inpatient rehab. Can be d/c when arrangements made.    Nutrition:   tpn discontinued.  Tolerating diet well.    Left wrist Fracture: casted Ortho following, will need to follow outpatient.Repeat xray today little change    Acute respiratory failure:resolved, got extubated doing well off the ventilator.  cxr improved appearance.    ARF: resolved    Rhabdomyolisis: resolved    Hypernatremia: resolved, now off IVF.    Dispo: snf vs sah. prob Monday.      Subjective:     ADMIT [name] is a 48 y.o. Caucasian female whom presents with complaint of left forearm pain, nausea and  vomiting.  Patient with a hx of cirrohsis presents ambulatory to ED with cc of vomiting. Pt's relative reports nausea and vomiting all day. Per family member, when pt experiences N/V secondary to cirrhosis, it usually is associated with a fever. Pt's relative reports that pt fractured her ulna and radius 2 days ago after falling and that fractures were never set, just wrapped. Pt's relative also notes that pt's vision has been worsening, indicating this as the reason she fell 2 days ago   We were asked to admit for work up and evaluation of the above problems.       Chief Complaint: Remains more lucid, deny's hallucinations.   Review of Systems:  Fever/chills N   Cough y   Sputum    y   SOB/DOE  N   Chest Pain N   Abdominal Pain N   Diarrhea N   Constipation N   Nausea/Vomiting N   Dysuria N   Tolerating PT y   Tolerating Diet y   Could not obtain due to AMS vs intubation        Objective:     VITALS:   Last 24hrs VS reviewed since prior progress note. Most recent are:  Visit Vitals   Item Reading   ? BP 107/51   ? Pulse 54   ? Temp 99.4  F (37.4 C)   ? Resp 18   ? Ht 5' 2 (1.575 m)   ? Wt 144 lb 13.5 oz (65.7 kg)   ? SpO2 96%   ? PF 40L/min     Intake/Output Summary (Last 24 hours) at 05/21/09 2029  Last data filed at 05/21/09 1300   Gross per 24 hour   Intake      0 ml   Output    650 ml   Net   -650 ml       Telemetry Reviewed:     PHYSICAL EXAM:  General:  awake , nad, cooperative   HEENT: Pupils unequal, R 3mm, L 2mm, eomi  Lungs:             Essentially clear and non-labored.  Heart:  Regular rhythm, tachycardic, SEM  Abdomen: Soft, Non distended, hypoactive BS,RUQ pain,  no rebound, umbilical hernia non reducible & NT.  Extremities: No edema, pulses present, brace in left arm  Neurologic: aa0x 3, no gross deficit  Psych: Alert and awake today    Lab Data Reviewed: (see below)    Medications Reviewed: (see below)    PMH/SH reviewed - no change compared to H&P    EGD BX 05/17/09:  1. Duodenum,  biopsy:  Normal small intestinal mucosa.  2. Stomach, biopsy:  Benign fundic and antral mucosa with mild reactive gastropathy.  Giemsa stain for Helicobacter pylori is negative.  3. Mid/distal esophagus, biopsy:  Benign squamous mucosa with features suggestive of reflux  esophagitis.  No evidence of eosinophilic esophagitis or Barrett's.  ______________________________________________________________________      Care Plan discussed with:  Patient x   Family    RN x   Care Manager    Consultant/Specialist      >50% of visit spent in counseling and coordination of care y      Prophylaxis:  GI PPI   DVT SCD     Disposition:   Home with Family    HH/PT/OT/RN    SNF/LTC x   [facility]      ______________________________________________________________________  Attending Physician: Jesus A. Chandra, MD   _____________________________________________________________________________________________________  Procedures: see electronic medical records for all procedures/Xrays and details  which were not copied into this note but were reviewed prior to creation of Plan.      LABS:  Recent Labs   Basename 05/21/09 0245 05/19/09 0355   ? WBC 4.7 5.2   ? HGB 7.9* 8.2*   ? HCT 25.9* 26.0*   ? PLT 91* 87*     Recent Labs   Basename 05/21/09 0245 05/19/09 0355   ? NA 140 140   ? K 3.5 3.9   ? CL 104 107   ? CO2 24 26   ? BUN 7 10   ? CREA 0.7 0.7   ? GLU 88 107*   ? CA 9.2 8.9   ? MG -- --   ? PHOS -- --   ? URICA -- --     No results found for this basename: SGOT:3,GPT:3,AP:3,TBIL:3,TP:3,ALB:3,GLOB:3,GGT:3,AML:3,AMYP:3,LPSE:3,HLPSE:3 in the last 72 hours    No results found for this basename: INR:3,PTP:3,APTT:3, in the last 72 hours     No results found for this basename: FE:2,TIBC:2,PSAT:2,FERR:2, in the last 72 hours     No results found for this basename: PH:2,PCO2:2,PO2:2, in the last 72 hours    No results found for this basename: CPK:3,CKMB:3,TROPONINI:3, in the last 72 hours    Lab Results   Component Value  Date/Time    POC GLUCOSE  90 05/21/2009 11:26 AM    POC GLUCOSE 96 05/21/2009  7:48 AM    POC GLUCOSE 97 05/20/2009  9:28 PM    POC GLUCOSE 85 05/20/2009  5:18 PM    POC GLUCOSE 103 05/20/2009 12:18 PM     Lab Results   Component Value Date/Time    Color DARK YELLOW 05/09/2009 11:05 AM    Appearance CLEAR 05/09/2009 11:05 AM    Specific gravity 1.010 10/29/2008 11:00 AM    Specific gravity 1.024 05/09/2009 11:05 AM    pH 6.0 05/09/2009 11:05 AM    Protein TRACE 05/09/2009 11:05 AM    Glucose NEGATIVE  05/09/2009 11:05 AM    Ketone NEGATIVE  05/09/2009 11:05 AM    Bilirubin NEGATIVE  04/30/2009 12:14 AM    Urobilinogen 0.2 05/09/2009 11:05 AM    Nitrites NEGATIVE  05/09/2009 11:05 AM    Leukocyte Esterase SMALL 05/09/2009 11:05 AM    Epithelial cells 0-5 05/09/2009 11:05 AM    Bacteria NEGATIVE  05/09/2009 11:05 AM    WBC 5-10 05/09/2009 11:05 AM    RBC 10-20 05/09/2009 11:05 AM     MEDICATIONS:  Current facility-administered medications   Medication Dose Route Frequency   ? albuterol /ipratropium (DUONEB ) neb solution   1 Dose Nebulization Q4H PRN   ? lorazepam (ATIVAN) injection 1 mg  1 mg IntraVENous Q4H PRN   ? pantoprazole  (PROTONIX ) tablet 40 mg  40 mg Oral ACB   ? dextrose  5 % - 0.9% NaCl infusion    IntraVENous CONTINUOUS   ? rifaximin (XIFAXAN) tablet 400 mg  400 mg Oral TID   ? aspirin chewable tablet 81 mg  81 mg Oral DAILY   ? metoprolol  (LOPRESSOR ) tablet 25 mg  25 mg Oral Q12H   ? albumin human 5% (BUMINATE) solution 25 g  25 g IntraVENous Q4H PRN   ? sodium chloride  (NS) flush 10 mL  10 mL InterCATHeter Q8H   ? sodium chloride  (NS) flush 10 mL  10 mL InterCATHeter PRN   ? heparin (porcine) pf 300 Units  300 Units InterCATHeter PRN   ? sodium chloride  (NS) flush 20 mL  20 mL InterCATHeter PRN   ? acetaminophen  (TYLENOL ) suppository 650 mg  650 mg Rectal Q6H PRN   ? saline peripheral flush 5 mL  5 mL InterCATHeter PRN       Electronically signed by Chandra Byers, MD at 05/21/2009  9:18 PM EDT

## 2009-05-21 NOTE — Progress Notes (Signed)
 Formatting of this note is different from the original.  Problem: Mobility Impaired (Adult)  Goal: *Acute Goals and Plan of Care (Insert Text)  Physical Therapy Goals  Revised 05/20/2009  1. Patient will move from supine to sit and sit to supine , scoot up and down and roll side to side in bed with modified independence within 7 day(s).   2. Patient will transfer from bed to chair and chair to bed with minimal assistance/CGA using the least restrictive device within 7 day(s).  3. Patient will perform sit to stand with minimal assistance/CGA within 7 day(s).  4. Patient will be able to ambulate with minimal assist for 20 feet within 7 days.   5. Patient will perform 1 sets of 15 repetitions of active strengthening exercises for bilateral upper and lower extremity(s) with supervision/set-up within 7 day(s). REMAINS    Initiated 05/12/2009  1. Patient will move from supine to sit and sit to supine , scoot up and down and roll side to side in bed with moderate assistance within 7 day(s).   2. Patient will transfer from bed to chair and chair to bed with moderate assistance using the least restrictive device within 7 day(s).  3. Patient will perform sit to stand with moderate assistance within 7 day(s).  4. Patient will be able to sit at EOB x 5 minutes while maintaining midline with minimum assist within 7 days.   5. Patient will perform 1 sets of 15 repetitions of active strengthening exercises for bilateral upper and lower extremity(s) with supervision/set-up within 7 day(s).     PHYSICAL THERAPY TREATMENT    NAME: Breanna Lester AGE: 48 y.o.  GENDER: female  DATE: 05/21/2009  PRIMARY DIAGNOSIS: AMS, hypokalemia, lft upper extremity pain  AMS, hypokalemia, lft upper extremity pain  AMS, hypokalemia, lft upper extremity pain  Colonoscopy  <principal problem not specified>  Procedure(s) (LRB):  ESOPHAGOGASTRODUODENOSCOPY (EGD) (N/A)  ESOPHAGOGASTRODUODENAL (EGD) BIOPSY (N/A)  4 Days Post-Op    Precautions: Fall  Chart ,  physical therapy assessment, plan of care and goals  w ere  reviewed.    SUBJECTIVE:   Patient stated ? I want to try, I am so weak .?    OBJECTIVE DATA SUMMARY:   Critical Behavior:  Level of Consciousness: Alert;Confused;Drowsy;Restless  Orientation Level: Disoriented to place;Disoriented to situation;Disoriented to time;Oriented to person  Cognition: Decreased attention/concentration;Follows commands;Impulsive  Safety/Judgement: Decreased awareness of need for assistance;Decreased awareness of need for safety  Functional Mobility Training:  Bed Mobility:  Rolling: Minimal  assistance  to the right  Supine to Sit: Moderate assistance  Scooting: Additional time;Minimum assistance  Verbal cues to avoid weight bearing through left UE  Transfers:  Sit to Stand: Additional time;Assist X2;Minimum assistance;Moderate assistance  4 trials (verbal cues to avoid weight bearing through LUE)  1st trial: Min assist of 1, Mod assist of 1  2nd trial: Min assist of 1, CGA of 1 (with bed raised )  3rd trial Min assist of 1 CGA (with bed raised)  4th trial Min assist x 2 (with bed raised)  Stand pivot transfer: Min assist x 2 ( pt was able to initiate pivoting with steps)  Balance:  Sitting: Impaired  Sitting - Static: Good (unsupported)  Sitting - Dynamic: Not tested  Standing: Impaired  Standing - Static: Constant support;Poor  Standing - Dynamic : Impaired (Poor)  Pain:  No c/o pain    Activity Tolerance: Per nursing  Vitals Assessment 1:  Patient Position: At rest  BP:  113/53 mmHg  Pulse (Heart Rate): 55   O2 Sat (%): 91 %  O2 Device: Room air          After treatment:  [X]     Patient left in no apparent distress  [X]     Call bell left within reach  [ ]     Caregiver present  [X]     Patient left up in chair  with bed alarm on , nursing staff notified    ASSESSMENT AND PROGRESSION TOWARD GOALS:   Pt much improved disposition today and very motivated to participate in therapy.  Pt required less assistance today with standing but  still difficulty attaining balance in standing. Pt able to follow instructions with verbal and visual cues.  Pt has decreased endurance, balance and strength that would benefit from inpatient rehab on discharge.    Patient?s progression toward goals is as follows:  [ ]     Improving appropriately and progressing toward goals  [X]     Improving slowly and progressing toward goals  [ ]     Not making progress toward goals and plan of care will be adjusted    PLAN OF CARE:   Patient continues to benefit from skilled intervention to address the above impairments.  Continue treatment per established plan of care.  Planned Interventions:  [X]     Bed Mobility Training                  [ ]     Neuromuscular Re-education  [X]     Transfer Training                        [ ]     Orthotic/Prosthetic Training  [X]     Gait Training                               [ ]     Modalities  [X]    Therapeutic Exercises               [ ]     Edema Management/Control  [X]     Therapeutic Activities                 [X]     Patient and Family Training/Education  [ ]     Other (comment):  Discharge Recommendations:  [ ]     To be determined                        [ ]     Day rehabilitation program  [ ]     Home without services                [ ]     Outpatient therapy  [ ]     Home with Home Health services           [ ]     Nursing facility for rehab  [X]     [facility]patient [facility]             [ ]     Nursing facility for long term care  [ ]     Other:  Further Equipment Recommendations for Discharge: TBD  Communication/Collaboration:  The patient?s plan of care was discussed with:  Certified Occupational Therapy Assistant, Registered Nurse and Social Worker    Haven A. Mason  Time Calculation: 27 mins      Electronically signed by Jonette Shadow A at 05/21/2009  4:10 PM EDT

## 2009-05-21 NOTE — Progress Notes (Signed)
 Formatting of this note is different from the original.  Problem: Self Care Deficits Care Plan (Adult)  Goal: *Acute Goals and Plan of Care (Insert Text)  Occupational Therapy Goals reviewed and revised ( as needed) as per 9/29 reevaluation.  1. Pt will complete grooming in supported position with minimal assistance within 7 days.  2. Pt will increase fine motor skills to open task containers (toothpaste, lotion, ect) with set up assist within 7 days.  3. Pt. Will perform SPT to/from [facility] with min assist within 7 days. (established 9/29)  4. Pt will tolerated x 10 min edge of bed with minimal assistance while completing dynamic balance activity within 7 days.  5. Pt will move from supine to sit with minimal assistance within 7 days, in preparation for functional mobility.   OCCUPATIONAL THERAPY TREATMENT    NAME: Breanna Lester AGE: 48 y.o.  GENDER: female  DATE: 05/21/2009  PRIMARY DIAGNOSIS: AMS, hypokalemia, lft upper extremity pain  AMS, hypokalemia, lft upper extremity pain  AMS, hypokalemia, lft upper extremity pain  Colonoscopy  <principal problem not specified>  Procedure(s) (LRB):  ESOPHAGOGASTRODUODENOSCOPY (EGD) (N/A)  ESOPHAGOGASTRODUODENAL (EGD) BIOPSY (N/A)  4 Days Post-Op     Precautions: fall, contact    Chart, occupational therapy assessment, plan of care, and goals were reviewed.    SUBJECTIVE:   Patient stated ? I can't even feed myself. This is so frustrating .?    OBJECTIVE DATA SUMMARY:   Cognitive/Behavioral Status:  Level of Consciousness: Drowsy (but became more alert as session progressed)  Orientation Level: Disoriented to place;Disoriented to situation;Disoriented to time;Oriented to person  Cognition: Decreased attention/concentration;Follows commands;Impulsive  Safety/Judgement: Decreased awareness of need for assistance;Decreased awareness of need for safety  Functional Mobility and Transfers for ADLs:              Bed Mobility:  Performed with PTA. See PT note.              Transfers:   Assisted PT with transfers and sit to stand. See PT note.              Sit to Stand: Additional time;Assist X2;Minimum assistance;Moderate assistance  Balance:  Sitting: Impaired  Sitting - Static: Good (unsupported)  Sitting - Dynamic: Fair (contact guard assist)  Standing: Impaired  Standing - Static: Constant support;Poor  Standing - Dynamic : Impaired (Poor)  ADL Intervention:  Feeding  Patient had significant difficulty with self feeding this session secondary to UE weakness, visual deficits, and fatigue.  Container Management:  (patient demonstrates difficulty managing containers secondar y to UE weakness and poor grip strength )  Utensil Management: Compensatory technique training (patient demonstrates difficulty manipulating utensils  secondary to decreased grip strength ; provi ded patient with built up fork )  Food to Mouth:  (patient demonstrates difficulty with food to mouth secondary  to UE weakness. Supported patient's UEs with pillows to assist with feeding. Patient fatigued quickly during feeding )  Drink to Mouth:  (patient demonstrates difficulty with drink to mouth secondar y to UE weakness. Supported patient's UEs with pillows to assist with feeding. Patient fatigued quickly during feeding )  Adaptive Equipment: Built up fork  Patient also demonstrated difficulty locating items on left side of tray secondary to visual deficits. Shifted items to right side of tray to allow patient to locate items.     Grooming  Brushing/Combing Hair:  (required support at elbows to comb hair  secondary to UE weakness. Required mod assist to complete activity. )  Cues: Verbal cues provided;Tactile cues provided;Physical assistance;Visual cues provided    Lower Body Dressing Assistance  Socks:  (maximum assistance for donning socks. able to cross legs but  difficulty seeing sock and foot to don. Patient states this is secondary to her glaucoma. )  Leg Crossed Method Used: Yes  Position Performed: Seated edge of bed  (contact guard assist for maintenance of balance)  Cues: Donn;Visual cues provided;Physical assistance;Doff;Tactile cues provided;Verbal cues provided    Cognitive Retraining  Problem Solving: Awareness of environment;Deductive reason;Identifying the problem (during functional transfers)  Organizing/Sequencing: Breaking task down (for sit to stand & SPT to/from [facility] with walker.)  Attention to Task: Distractibility  Following Commands: Follows two step commands/directions  Safety/Judgement: Decreased awareness of need for assistance;Decreased awareness of need for safety  Cues: Tactile cues provided;Verbal cues provided;Visual cues provided     Therapeutic Exercises: Patient educated with UE exercises. Patient presents with UE weakness. Patient verbalizes understanding of exercises and their importance and benefits. Performed the following:    EXERCISE   Sets   Reps   Active Active Assist   Passive   Comments   Shoulder raises 1 10 [ ]  [X]  [ ]      Shoulder press 1 10 [ ]  [X]  [ ]      Finger flexion/extension 1 10 [X]  [ ]  [ ]        Pain:  Pain Scale: Verbal  Pain Intensity: 0  Pain Location: Back (says she has kidney pain)    Activity Tolerance: Vitals stable throughout session.    After treatment:  [X]    Patient left in NAD  [X]    Call bell left within reach  [ ]    Caregiver present  [X]    Patient left up in chair, nursing staff notified    ASSESSMENT AND PROGRESSION TOWARD GOALS:   Patient pleasant and agreeable to therapy. Very motivated to improve functioning and independence in ADLs.Patient presents with UE weakness, visual deficits, decreased balance, activity tolerance, and strength all limiting her function and performance in ADLs. Patient is very debilitated and would benefit from intensive rehab to regain function.  Feel that patient would be a good inpatient rehab candidate.    Patient?s progression toward goals is as follows:  [ ]    Improving appropriately and progressing toward goals  [X]    Improving  slowly and progressing toward goals  [ ]    Not making progress toward goals and plan of care will be adjusted    PLAN OF CARE:   Patient continues to benefit from skilled intervention to address the above impairments.  Continue treatment per established plan of care.  Planned Interventions:  [X]    Self Care Training                    [X]     Therapeutic Activities  [X]    Functional Mobility Training     [X]     Cognitive Retraining  [X]    Therapeutic Exercises              [X]     Endurance Activities  [X]    Balance Training                       [ ]     Neuromuscular Re-education  [X]    Patient Education                      [ ]     Family Training/Education  [ ]   Visual/Perceptual Training         [ ]     Home safety training  [ ]    Other (Comment):  Discharge Recommendations:  [ ]    To be determined                                   [ ]     Day rehabilitation program  [ ]    Home without services  (home safety education provided)  [ ]    Home with Home Health services  (home safety education provided)  [ ]   Outpatient therapy                                    [ ]     Nursing facility for rehab  [X]    Inpatient [facility]                      [ ]     Nursing facility for long term care  [ ]    Other:  Further Equipment Recommendations for Discharge: tbd  Communication/Collaboration:  The patient?s plan of care was discussed with: Physical Therapy Assistant, Occupational Therapist    Vernell BRAVO. [place], COTA /L  Time Calculation: 25 mins          Electronically signed by Amado Elouise SQUIBB, OTR/L at 05/22/2009  8:57 AM EDT

## 2009-05-21 NOTE — Progress Notes (Signed)
 Formatting of this note might be different from the original.  GI    Pt seen at bedside     No C/O    Hgb stable    I will have call service see one day 10/2 or 10/3 if she is still here    Levander PHEBE Angle, MD  5:05 PM  05/21/2009    Electronically signed by Angle Levander FALCON, MD at 05/21/2009  5:05 PM EDT

## 2009-05-21 NOTE — Progress Notes (Signed)
Problem: Self Care Deficits Care Plan (Adult)  Goal: *Acute Goals and Plan of Care (Insert Text)  Occupational Therapy Goals reviewed and revised ( as needed) as per 9/29 reevaluation.  1. Pt will complete grooming in supported position with minimal assistance within 7 days.  2. Pt will increase fine motor skills to open task containers (toothpaste, lotion, ect) with set up assist within 7 days.  3. Pt. Will perform SPT to/from [facility] with min assist within 7 days. (established 9/29)  4. Pt will tolerated x 10 min edge of bed with minimal assistance while completing dynamic balance activity within 7 days.  5. Pt will move from supine to sit with minimal assistance within 7 days, in preparation for functional mobility.   OCCUPATIONAL THERAPY TREATMENT    NAME: Breanna Lester AGE: 48 y.o.  GENDER: female  DATE: 05/21/2009  PRIMARY DIAGNOSIS: AMS, hypokalemia, lft upper extremity pain  AMS, hypokalemia, lft upper extremity pain  AMS, hypokalemia, lft upper extremity pain  Colonoscopy  <principal problem not specified>  Procedure(s) (LRB):  ESOPHAGOGASTRODUODENOSCOPY (EGD) (N/A)  ESOPHAGOGASTRODUODENAL (EGD) BIOPSY (N/A)  4 Days Post-Op     Precautions: fall, contact    Chart, occupational therapy assessment, plan of care, and goals were reviewed.      SUBJECTIVE:   Patient stated ??? I can't even feed myself. This is so frustrating .???     OBJECTIVE DATA SUMMARY:   Cognitive/Behavioral Status:  Level of Consciousness: Drowsy (but became more alert as session progressed)  Orientation Level: Disoriented to place;Disoriented to situation;Disoriented to time;Oriented to person  Cognition: Decreased attention/concentration;Follows commands;Impulsive  Safety/Judgement: Decreased awareness of need for assistance;Decreased awareness of need for safety  Functional Mobility and Transfers for ADLs:              Bed Mobility:  Performed with PTA. See PT note.               Transfers:  Assisted PT with transfers and sit to stand. See PT note.              Sit to Stand: Additional time;Assist X2;Minimum assistance;Moderate assistance  Balance:  Sitting: Impaired  Sitting - Static: Good (unsupported)  Sitting - Dynamic: Fair (contact guard assist)  Standing: Impaired  Standing - Static: Constant support;Poor  Standing - Dynamic : Impaired (Poor)  ADL Intervention:  Feeding  Patient had significant difficulty with self feeding this session secondary to UE weakness, visual deficits, and fatigue.  Container Management:  (patient demonstrates difficulty managing containers secondar y to UE weakness and poor grip strength )  Utensil Management: Compensatory technique training (patient demonstrates difficulty manipulating utensils  secondary to decreased grip strength ; provi ded patient with built up fork )  Food to Mouth:  (patient demonstrates difficulty with food to mouth secondary  to UE weakness. Supported patient's UEs with pillows to assist with feeding. Patient fatigued quickly during feeding )  Drink to Mouth:  (patient demonstrates difficulty with drink to mouth secondar y to UE weakness. Supported patient's UEs with pillows to assist with feeding. Patient fatigued quickly during feeding )  Adaptive Equipment: Built up fork  Patient also demonstrated difficulty locating items on left side of tray secondary to visual deficits. Shifted items to right side of tray to allow patient to locate items.     Grooming  Brushing/Combing Hair:  (required support at elbows to comb hair  secondary to UE weakness. Required mod assist to complete activity. )  Cues: Verbal cues provided;Tactile cues  provided;Physical assistance;Visual cues provided    Lower Body Dressing Assistance  Socks:  (maximum assistance for donning socks. able to cross legs but  difficulty seeing sock and foot to don. Patient states this is secondary to her glaucoma. )  Leg Crossed Method Used: Yes   Position Performed: Seated edge of bed (contact guard assist for maintenance of balance)  Cues: Donn;Visual cues provided;Physical assistance;Doff;Tactile cues provided;Verbal cues provided    Cognitive Retraining  Problem Solving: Awareness of environment;Deductive reason;Identifying the problem (during functional transfers)  Organizing/Sequencing: Breaking task down (for sit to stand & SPT to/from [facility] with walker.)  Attention to Task: Distractibility  Following Commands: Follows two step commands/directions  Safety/Judgement: Decreased awareness of need for assistance;Decreased awareness of need for safety  Cues: Tactile cues provided;Verbal cues provided;Visual cues provided     Therapeutic Exercises: Patient educated with UE exercises. Patient presents with UE weakness. Patient verbalizes understanding of exercises and their importance and benefits. Performed the following:    EXERCISE   Sets   Reps   Active Active Assist   Passive   Comments   Shoulder raises 1 10 [ ]  [X]  [ ]      Shoulder press 1 10 [ ]  [X]  [ ]      Finger flexion/extension 1 10 [X]  [ ]  [ ]        Pain:  Pain Scale: Verbal  Pain Intensity: 0  Pain Location: Back (says she has kidney pain)    Activity Tolerance: Vitals stable throughout session.    After treatment:  [X]    Patient left in NAD  [X]    Call bell left within reach  [ ]    Caregiver present  [X]    Patient left up in chair, nursing staff notified      ASSESSMENT AND PROGRESSION TOWARD GOALS:   Patient pleasant and agreeable to therapy. Very motivated to improve functioning and independence in ADLs.Patient presents with UE weakness, visual deficits, decreased balance, activity tolerance, and strength all limiting her function and performance in ADLs. Patient is very debilitated and would benefit from intensive rehab to regain function.  Feel that patient would be a good inpatient rehab candidate.    Patient???s progression toward goals is as follows:   [ ]    Improving appropriately and progressing toward goals  [X]    Improving slowly and progressing toward goals  [ ]    Not making progress toward goals and plan of care will be adjusted      PLAN OF CARE:   Patient continues to benefit from skilled intervention to address the above impairments.  Continue treatment per established plan of care.  Planned Interventions:  [X]    Self Care Training                    [X]     Therapeutic Activities  [X]    Functional Mobility Training     [X]     Cognitive Retraining  [X]    Therapeutic Exercises              [X]     Endurance Activities  [X]    Balance Training                       [ ]     Neuromuscular Re-education  [X]    Patient Education                      [ ]     Family Training/Education  [ ]   Visual/Perceptual Training         [ ]     Home safety training  [ ]    Other (Comment):  Discharge Recommendations:  [ ]    To be determined                                   [ ]     Day rehabilitation program  [ ]    Home without services  (home safety education provided)  [ ]    Home with Home Health services  (home safety education provided)  [ ]   Outpatient therapy                                    [ ]     Nursing facility for rehab  [X]    Inpatient Rehab Hospital                      [ ]     Nursing facility for long term care  [ ]    Other:  Further Equipment Recommendations for Discharge: tbd  Communication/Collaboration:  The patient???s plan of care was discussed with: Physical Therapy Assistant, Occupational Therapist    Vinnie Langton. Sherrie George, COTA /L  Time Calculation: 25 mins

## 2009-05-21 NOTE — Progress Notes (Signed)
GI    Pt seen at bedside     No C/O    Hgb stable    I will have call service see one day 10/2 or 10/3 if she is still here    [name], MD  5:05 PM  05/21/2009

## 2009-05-21 NOTE — Progress Notes (Signed)
Hospitalist Progress Note         NAME: Breanna Lester        DOB:  02/05/1961        MRN:  [id number]      Assessment & Plan   Sepsis syndrome :underlying pneumonia, fever, leukocytosis, resolved.   off vanco and LVQ.   Off Zosyn  No evidence of UTI or ascites(SBP), has an umbilical hernia that is not     reducible but overall adbomen is soft,   No diarrhea/ off lactulose  F/u cxr    Anemia, s/p prbc, s/p egd with no source of bleed and path benign: egd revealed Schatzki's ring/ HH.  Continue ppi.  Will transfuse if hgb <7.9.    Altered mental status : multifactorial Sepsis, hypernatremia, ARF, hyperamonemia, Hepatic encephalopathy resolving. Much improved, hallucinations not evidenced.     Hepatic encephalopathy on rifaximin now, GI input appreciated. Hx cirrhosis.Taken off lactulose because of diarrhea,  Improved.  Will eventually need to resume her diuretics.    NSTEMI:on ASA and beta-blockers, Received  Lovenox earlier,   Cardiology followed, ischemic workup  as outpatient    Hypothyroidism(?) tsh suppressed < 0.01, discontinued levothyroxine, recheck tsh remains suppressed but Ft4 upper limit nl, stopped levothyroxine 9/28.  Repeat tsh in 4-6 weeks.    Gait abnormality / generalized debility  B12, folate normal, PT/OT input appreciated.  Will ask [facility] opinion on appropriateness for inpatient rehab. Can be d/c when arrangements made.    Nutrition:   tpn discontinued.  Tolerating diet well.    Left wrist Fracture: casted Ortho following, will need to follow outpatient.Repeat xray today "little change"    Acute respiratory failure:resolved, got extubated doing well off the ventilator.  cxr improved appearance.    ARF: resolved    Rhabdomyolisis: resolved    Hypernatremia: resolved, now off IVF.    Dispo: snf vs sah. prob Monday.         Subjective:     ADMIT [name] is a 48 y.o.?? Caucasian female whom presents with complaint of left forearm pain, nausea and vomiting.   Patient with a hx of cirrohsis presents ambulatory to ED with cc of vomiting. Pt's relative reports nausea and vomiting "all day".?? Per family member, when pt experiences N/V secondary to cirrhosis, it usually is associated with a fever. Pt's relative reports that pt fractured her ulna and radius 2 days ago after falling and that fractures were never "set", just "wrapped". Pt's relative also notes that pt's vision has been worsening, indicating this as the reason she fell 2 days ago   We were asked to admit for work up and evaluation of the above problems.   ????    Chief Complaint: Remains more lucid, deny's hallucinations.   Review of Systems:  Fever/chills N   Cough y   Sputum    y   SOB/DOE  N   Chest Pain N   Abdominal Pain N   Diarrhea N   Constipation N   Nausea/Vomiting N   Dysuria N   Tolerating PT y   Tolerating Diet y   Could not obtain due to AMS vs intubation         Objective:       VITALS:   Last 24hrs VS reviewed since prior progress note. Most recent are:  Visit Vitals   Item Reading   ??? BP 107/51   ??? Pulse 54   ??? Temp 99.4 ??F (37.4 ??C)   ??? Resp 18   ???  Ht 5\' 2"  (1.575 m)   ??? Wt 144 lb 13.5 oz (65.7 kg)   ??? SpO2 96%   ??? PF 40L/min           Intake/Output Summary (Last 24 hours) at 05/21/09 2029  Last data filed at 05/21/09 1300   Gross per 24 hour   Intake      0 ml   Output    650 ml   Net   -650 ml        Telemetry Reviewed:     PHYSICAL EXAM:  General:  awake , nad, cooperative   HEENT: Pupils unequal, R 3mm, L 2mm, eomi  Lungs:             Essentially clear and non-labored.  Heart:  Regular rhythm, tachycardic, SEM  Abdomen: Soft, Non distended, hypoactive BS,RUQ pain,  no rebound, umbilical hernia non reducible & NT.  Extremities: No edema, pulses present, brace in left arm  Neurologic:?? aa0x 3, no gross deficit  Psych:???? Alert and awake today    Lab Data Reviewed: (see below)    Medications Reviewed: (see below)    PMH/SH reviewed - no change compared to H&P    EGD BX 05/17/09:   1. Duodenum, biopsy:  Normal small intestinal mucosa.  2. Stomach, biopsy:  Benign fundic and antral mucosa with mild reactive gastropathy.  Giemsa stain for Helicobacter pylori is negative.  3. Mid/distal esophagus, biopsy:  Benign squamous mucosa with features suggestive of reflux  esophagitis.  No evidence of eosinophilic esophagitis or Barrett's.  ______________________________________________________________________        Care Plan discussed with:  Patient x   Family    RN x   Care Manager    Consultant/Specialist      >50% of visit spent in counseling and coordination of care y      Prophylaxis:  GI PPI   DVT SCD     Disposition:   Home with Family    HH/PT/OT/RN    SNF/LTC x   SAHR      ______________________________________________________________________  Attending Physician: Reyaansh Merlo A. Bradly Bienenstock, MD   _____________________________________________________________________________________________________  Procedures: see electronic medical records for all procedures/Xrays and details  which were not copied into this note but were reviewed prior to creation of Plan.      LABS:  Recent Labs   Basename 05/21/09 0245 05/19/09 0355   ??? WBC 4.7 5.2   ??? HGB 7.9* 8.2*   ??? HCT 25.9* 26.0*   ??? PLT 91* 87*         Recent Labs   Basename 05/21/09 0245 05/19/09 0355   ??? NA 140 140   ??? K 3.5 3.9   ??? CL 104 107   ??? CO2 24 26   ??? BUN 7 10   ??? CREA 0.7 0.7   ??? GLU 88 107*   ??? CA 9.2 8.9   ??? MG -- --   ??? PHOS -- --   ??? URICA -- --           No results found for this basename: SGOT:3,GPT:3,AP:3,TBIL:3,TP:3,ALB:3,GLOB:3,GGT:3,AML:3,AMYP:3,LPSE:3,HLPSE:3 in the last 72 hours      No results found for this basename: INR:3,PTP:3,APTT:3, in the last 72 hours     No results found for this basename: FE:2,TIBC:2,PSAT:2,FERR:2, in the last 72 hours     No results found for this basename: PH:2,PCO2:2,PO2:2, in the last 72 hours      No results found for this basename: CPK:3,CKMB:3,TROPONINI:3, in the  last 72 hours    Lab Results    Component Value Date/Time    POC GLUCOSE 90 05/21/2009 11:26 AM    POC GLUCOSE 96 05/21/2009  7:48 AM    POC GLUCOSE 97 05/20/2009  9:28 PM    POC GLUCOSE 85 05/20/2009  5:18 PM    POC GLUCOSE 103 05/20/2009 12:18 PM         Lab Results   Component Value Date/Time    Color DARK YELLOW 05/09/2009 11:05 AM    Appearance CLEAR 05/09/2009 11:05 AM    Specific gravity 1.010 10/29/2008 11:00 AM    Specific gravity 1.024 05/09/2009 11:05 AM    pH 6.0 05/09/2009 11:05 AM    Protein TRACE 05/09/2009 11:05 AM    Glucose NEGATIVE  05/09/2009 11:05 AM    Ketone NEGATIVE  05/09/2009 11:05 AM    Bilirubin NEGATIVE  04/30/2009 12:14 AM    Urobilinogen 0.2 05/09/2009 11:05 AM    Nitrites NEGATIVE  05/09/2009 11:05 AM    Leukocyte Esterase SMALL 05/09/2009 11:05 AM    Epithelial cells 0-5 05/09/2009 11:05 AM    Bacteria NEGATIVE  05/09/2009 11:05 AM    WBC 5-10 05/09/2009 11:05 AM    RBC 10-20 05/09/2009 11:05 AM           MEDICATIONS:  Current facility-administered medications   Medication Dose Route Frequency   ??? albuterol/ipratropium (DUONEB) neb solution   1 Dose Nebulization Q4H PRN   ??? lorazepam (ATIVAN) injection 1 mg  1 mg IntraVENous Q4H PRN   ??? pantoprazole (PROTONIX) tablet 40 mg  40 mg Oral ACB   ??? dextrose 5 % - 0.9% NaCl infusion    IntraVENous CONTINUOUS   ??? rifaximin (XIFAXAN) tablet 400 mg  400 mg Oral TID   ??? aspirin chewable tablet 81 mg  81 mg Oral DAILY   ??? metoprolol (LOPRESSOR) tablet 25 mg  25 mg Oral Q12H   ??? albumin human 5% (BUMINATE) solution 25 g  25 g IntraVENous Q4H PRN   ??? sodium chloride (NS) flush 10 mL  10 mL InterCATHeter Q8H   ??? sodium chloride (NS) flush 10 mL  10 mL InterCATHeter PRN   ??? heparin (porcine) pf 300 Units  300 Units InterCATHeter PRN   ??? sodium chloride (NS) flush 20 mL  20 mL InterCATHeter PRN   ??? acetaminophen (TYLENOL) suppository 650 mg  650 mg Rectal Q6H PRN   ??? saline peripheral flush 5 mL  5 mL InterCATHeter PRN

## 2009-05-21 NOTE — Progress Notes (Signed)
Problem: Mobility Impaired (Adult)  Goal: *Acute Goals and Plan of Care (Insert Text)  Physical Therapy Goals  Revised 05/20/2009  1. Patient will move from supine to sit and sit to supine , scoot up and down and roll side to side in bed with modified independence within 7 day(s).   2. Patient will transfer from bed to chair and chair to bed with minimal assistance/CGA using the least restrictive device within 7 day(s).  3. Patient will perform sit to stand with minimal assistance/CGA within 7 day(s).  4. Patient will be able to ambulate with minimal assist for 20 feet within 7 days.   5. Patient will perform 1 sets of 15 repetitions of active strengthening exercises for bilateral upper and lower extremity(s) with supervision/set-up within 7 day(s). REMAINS    Initiated 05/12/2009  1. Patient will move from supine to sit and sit to supine , scoot up and down and roll side to side in bed with moderate assistance within 7 day(s).   2. Patient will transfer from bed to chair and chair to bed with moderate assistance using the least restrictive device within 7 day(s).  3. Patient will perform sit to stand with moderate assistance within 7 day(s).  4. Patient will be able to sit at EOB x 5 minutes while maintaining midline with minimum assist within 7 days.   5. Patient will perform 1 sets of 15 repetitions of active strengthening exercises for bilateral upper and lower extremity(s) with supervision/set-up within 7 day(s).     PHYSICAL THERAPY TREATMENT    NAME: Breanna Lester AGE: 48 y.o.  GENDER: female  DATE: 05/21/2009  PRIMARY DIAGNOSIS: AMS, hypokalemia, lft upper extremity pain  AMS, hypokalemia, lft upper extremity pain  AMS, hypokalemia, lft upper extremity pain  Colonoscopy  <principal problem not specified>  Procedure(s) (LRB):  ESOPHAGOGASTRODUODENOSCOPY (EGD) (N/A)  ESOPHAGOGASTRODUODENAL (EGD) BIOPSY (N/A)  4 Days Post-Op    Precautions: Fall   Chart , physical therapy assessment, plan of care and goals  w ere  reviewed.      SUBJECTIVE:   Patient stated ??? I want to try, I am so weak .???     OBJECTIVE DATA SUMMARY:   Critical Behavior:  Level of Consciousness: Alert;Confused;Drowsy;Restless  Orientation Level: Disoriented to place;Disoriented to situation;Disoriented to time;Oriented to person  Cognition: Decreased attention/concentration;Follows commands;Impulsive  Safety/Judgement: Decreased awareness of need for assistance;Decreased awareness of need for safety  Functional Mobility Training:  Bed Mobility:  Rolling: Minimal  assistance  to the right  Supine to Sit: Moderate assistance  Scooting: Additional time;Minimum assistance  Verbal cues to avoid weight bearing through left UE  Transfers:  Sit to Stand: Additional time;Assist X2;Minimum assistance;Moderate assistance  4 trials (verbal cues to avoid weight bearing through LUE)  1st trial: Min assist of 1, Mod assist of 1  2nd trial: Min assist of 1, CGA of 1 (with bed raised )  3rd trial Min assist of 1 CGA (with bed raised)  4th trial Min assist x 2 (with bed raised)  Stand pivot transfer: Min assist x 2 ( pt was able to initiate pivoting with steps)  Balance:  Sitting: Impaired  Sitting - Static: Good (unsupported)  Sitting - Dynamic: Not tested  Standing: Impaired  Standing - Static: Constant support;Poor  Standing - Dynamic : Impaired (Poor)  Pain:  No c/o pain    Activity Tolerance: Per nursing  Vitals Assessment 1:  Patient Position: At rest  BP: 113/53 mmHg  Pulse (Heart Rate): 55  O2 Sat (%): 91 %  O2 Device: Room air                  After treatment:  [X]     Patient left in no apparent distress  [X]     Call bell left within reach  [ ]     Caregiver present  [X]     Patient left up in chair  with bed alarm on , nursing staff notified      ASSESSMENT AND PROGRESSION TOWARD GOALS:    Pt much improved disposition today and very motivated to participate in therapy.  Pt required less assistance today with standing but still difficulty attaining balance in standing. Pt able to follow instructions with verbal and visual cues.  Pt has decreased endurance, balance and strength that would benefit from inpatient rehab on discharge.    Patient???s progression toward goals is as follows:  [ ]     Improving appropriately and progressing toward goals  [X]     Improving slowly and progressing toward goals  [ ]     Not making progress toward goals and plan of care will be adjusted      PLAN OF CARE:   Patient continues to benefit from skilled intervention to address the above impairments.  Continue treatment per established plan of care.  Planned Interventions:  [X]     Bed Mobility Training                  [ ]     Neuromuscular Re-education  [X]     Transfer Training                        [ ]     Orthotic/Prosthetic Training  [X]     Gait Training                               [ ]     Modalities  [X]    Therapeutic Exercises               [ ]     Edema Management/Control  [X]     Therapeutic Activities                 [X]     Patient and Family Training/Education  [ ]     Other (comment):  Discharge Recommendations:  [ ]     To be determined                        [ ]     Day rehabilitation program  [ ]     Home without services                [ ]     Outpatient therapy  [ ]     Home with Home Health services           [ ]     Nursing facility for rehab  [X]     Inpatient Rehab Hospital             [ ]     Nursing facility for long term care  [ ]     Other:  Further Equipment Recommendations for Discharge: TBD  Communication/Collaboration:  The patient???s plan of care was discussed with:  Certified Occupational Therapy Assistant, Registered Nurse and Social Worker    Icarus Partch A. Emiliana Blaize  Time Calculation: 27 mins

## 2009-05-22 LAB — CBC WITH AUTOMATED DIFF
ABS. BASOPHILS: 0.1 10*3/uL (ref 0.0–0.1)
ABS. EOSINOPHILS: 0.1 10*3/uL (ref 0.0–0.4)
ABS. LYMPHOCYTES: 1.6 10*3/uL (ref 0.8–3.5)
ABS. MONOCYTES: 0.3 10*3/uL (ref 0.0–1.0)
ABS. NEUTROPHILS: 2.8 10*3/uL (ref 1.8–8.0)
BASOPHILS: 1 % (ref 0–1)
EOSINOPHILS: 3 % (ref 0–7)
HCT: 25.7 % — ABNORMAL LOW (ref 35.0–47.0)
HGB: 8 g/dL — ABNORMAL LOW (ref 11.5–16.0)
LYMPHOCYTES: 32 % (ref 12–49)
MCH: 27.8 PG (ref 26.0–34.0)
MCHC: 31.1 g/dL (ref 30.0–36.5)
MCV: 89.2 FL (ref 80.0–99.0)
MONOCYTES: 7 % (ref 5–13)
NEUTROPHILS: 57 % (ref 32–75)
PLATELET: 81 10*3/uL — ABNORMAL LOW (ref 150–400)
RBC: 2.88 M/uL — ABNORMAL LOW (ref 3.80–5.20)
RDW: 20 % — ABNORMAL HIGH (ref 11.5–14.5)
WBC: 4.9 10*3/uL (ref 3.6–11.0)

## 2009-05-22 LAB — METABOLIC PANEL, BASIC
Anion gap: 5 mmol/L (ref 5–15)
BUN/Creatinine ratio: 9 — ABNORMAL LOW (ref 12–20)
BUN: 6 MG/DL (ref 6–20)
CO2: 25 MMOL/L (ref 21–32)
Calcium: 8.7 MG/DL (ref 8.5–10.1)
Chloride: 108 MMOL/L (ref 97–108)
Creatinine: 0.7 MG/DL (ref 0.6–1.3)
GFR est AA: >60 mL/min/{1.73_m2} (ref >60)
GFR est non-AA: >60 mL/min/{1.73_m2} (ref >60)
Glucose: 94 MG/DL (ref 65–100)
Potassium: 3.5 MMOL/L (ref 3.5–5.1)
Sodium: 138 MMOL/L (ref 136–145)

## 2009-05-22 LAB — GLUCOSE, POC
Glucose (POC): 154 mg/dL — ABNORMAL HIGH (ref 65–105)
Glucose (POC): 104 mg/dL (ref 65–105)
Glucose (POC): 105 mg/dL (ref 65–105)
Glucose (POC): 179 mg/dL — ABNORMAL HIGH (ref 65–105)

## 2009-05-22 LAB — MAGNESIUM: Magnesium: 1.5 MG/DL — ABNORMAL LOW (ref 1.6–2.4)

## 2009-05-22 MED ADMIN — aspirin chewable tablet 81 mg: ORAL | @ 14:00:00

## 2009-05-22 MED ADMIN — dextrose 5 % - 0.9% NaCl infusion: INTRAVENOUS | @ 07:00:00 | NDC 00409794109

## 2009-05-22 MED ADMIN — pantoprazole (PROTONIX) tablet 40 mg: ORAL | @ 10:00:00 | NDC 00008084181

## 2009-05-22 MED ADMIN — rifaximin (XIFAXAN) tablet 400 mg: ORAL | @ 14:00:00 | NDC 65649030105

## 2009-05-22 MED ADMIN — dextrose 5 % - 0.9% NaCl infusion: INTRAVENOUS | @ 02:00:00 | NDC 00409794109

## 2009-05-22 MED ADMIN — rifaximin (XIFAXAN) tablet 400 mg: ORAL | @ 21:00:00 | NDC 65649030105

## 2009-05-22 MED ADMIN — sodium chloride (NS) flush 10 mL: @ 10:00:00 | NDC 87701099893

## 2009-05-22 MED ADMIN — sodium chloride (NS) flush 10 mL: @ 21:00:00 | NDC 87701099893

## 2009-05-22 MED ADMIN — sodium chloride (NS) flush 10 mL: @ 02:00:00 | NDC 87701099893

## 2009-05-22 MED ADMIN — lorazepam (ATIVAN) injection 1 mg: INTRAVENOUS | @ 08:00:00 | NDC 10019010239

## 2009-05-22 MED ADMIN — rifaximin (XIFAXAN) tablet 400 mg: ORAL | @ 02:00:00 | NDC 65649030105

## 2009-05-22 MED FILL — XIFAXAN 200 MG TABLET: 200 mg | ORAL | Qty: 2

## 2009-05-22 MED FILL — ASPIRIN 81 MG CHEWABLE TAB: 81 mg | ORAL | Qty: 1

## 2009-05-22 MED FILL — LORAZEPAM 2 MG/ML IJ SOLN: 2 mg/mL | INTRAMUSCULAR | Qty: 1

## 2009-05-22 MED FILL — SALINE FLUSH INJECTION SYRINGE: INTRAMUSCULAR | Qty: 40

## 2009-05-22 MED FILL — PROTONIX 40 MG TABLET,DELAYED RELEASE: 40 mg | ORAL | Qty: 1

## 2009-05-22 MED FILL — SALINE FLUSH INJECTION SYRINGE: INTRAMUSCULAR | Qty: 30

## 2009-05-22 MED FILL — METOPROLOL TARTRATE 25 MG TAB: 25 mg | ORAL | Qty: 1

## 2009-05-22 MED FILL — SALINE FLUSH INJECTION SYRINGE: INTRAMUSCULAR | Qty: 10

## 2009-05-22 NOTE — Progress Notes (Signed)
 Formatting of this note is different from the original.       Hospitalist Progress Note         NAME: Breanna Lester        DOB:  May 23, 1961        MRN:  [id number]      Assessment & Plan   Sepsis syndrome :underlying pneumonia, fever, leukocytosis, resolved.   off vanco and LVQ.   Off Zosyn  No evidence of UTI or ascites(SBP), has an umbilical hernia that is not     reducible but overall adbomen is soft,   No diarrhea/ off lactulose    Anemia, s/p prbc, s/p egd with no source of bleed and path benign: egd revealed Schatzki's ring/ HH.  Continue ppi.  Will transfuse if hgb <7.9.    Altered mental status : multifactorial Sepsis, hypernatremia, ARF, hyperamonemia, Hepatic encephalopathy resolving. Much improved, hallucinations not evidenced.    Hepatic encephalopathy on rifaximin now, GI input appreciated. Hx cirrhosis.Taken off lactulose because of diarrhea,  Improved.  Will eventually need to resume her diuretics.    NSTEMI:on ASA and beta-blockers, Received  Lovenox  earlier,   Cardiology followed, ischemic workup  as outpatient    Hypothyroidism(?) tsh suppressed < 0.01, discontinued levothyroxine, recheck tsh remains suppressed but Ft4 upper limit nl, stopped levothyroxine 9/28.  Repeat tsh in 4-6 weeks.    Gait abnormality / generalized debility  B12, folate normal, PT/OT input appreciated.  Will ask [facility] Breanna Lester opinion on appropriateness for inpatient rehab. Can be d/c when arrangements made.    Nutrition:   tpn discontinued.  Tolerating diet well.    Left wrist Fracture: casted Ortho following, will need to follow outpatient.Repeat xray today little change    Acute respiratory failure:resolved, got extubated doing well off the ventilator.  cxr improved appearance.    ARF: resolved    Rhabdomyolisis: resolved    Hypernatremia: resolved, now off IVF.    Dispo: snf vs sah. prob Monday.      Subjective:     ADMIT [name]:Breanna Lester is a 48 y.o. Caucasian female whom presents with complaint of left forearm pain, nausea and  vomiting.  Patient with a hx of cirrohsis presents ambulatory to ED with cc of vomiting. Pt's relative reports nausea and vomiting all day. Per family member, when pt experiences N/V secondary to cirrhosis, it usually is associated with a fever. Pt's relative reports that pt fractured her ulna and radius 2 days ago after falling and that fractures were never set, just wrapped. Pt's relative also notes that pt's vision has been worsening, indicating this as the reason she fell 2 days ago   We were asked to admit for work up and evaluation of the above problems.       Chief Complaint: Remains more lucid, deny's hallucinations. No changes.  Review of Systems:  Fever/chills N   Cough y   Sputum    y   SOB/DOE  N   Chest Pain N   Abdominal Pain N   Diarrhea N   Constipation N   Nausea/Vomiting N   Dysuria N   Tolerating PT y   Tolerating Diet y   Could not obtain due to AMS vs intubation        Objective:     VITALS:   Last 24hrs VS reviewed since prior progress note. Most recent are:  Visit Vitals   Item Reading   ? BP 109/71   ? Pulse 62   ? Temp 97.8 F (36.6  C)   ? Resp 20   ? Ht 5' 2 (1.575 m)   ? Wt 144 lb 13.5 oz (65.7 kg)   ? SpO2 94%   ? PF 40L/min     Intake/Output Summary (Last 24 hours) at 05/22/09 0936  Last data filed at 05/21/09 1300   Gross per 24 hour   Intake      0 ml   Output    650 ml   Net   -650 ml       Telemetry Reviewed:     PHYSICAL EXAM:  General:  awake , nad, cooperative   HEENT: Pupils unequal, R 3mm, L 2mm, eomi  Lungs:             Essentially clear and non-labored.  Heart:  Regular rhythm, tachycardic, SEM  Abdomen: Soft, Non distended, hypoactive BS,RUQ pain,  no rebound, umbilical hernia non reducible & NT.  Extremities: No edema, pulses present, brace in left arm  Neurologic: aa0x 3, no gross deficit  Psych: Alert and awake today    Lab Data Reviewed: (see below)    Medications Reviewed: (see below)    PMH/SH reviewed - no change compared to H&P    EGD BX 05/17/09:  1.  Duodenum, biopsy:  Normal small intestinal mucosa.  2. Stomach, biopsy:  Benign fundic and antral mucosa with mild reactive gastropathy.  Giemsa stain for Helicobacter pylori is negative.  3. Mid/distal esophagus, biopsy:  Benign squamous mucosa with features suggestive of reflux  esophagitis.  No evidence of eosinophilic esophagitis or Barrett's.  ______________________________________________________________________      Care Plan discussed with:  Patient x   Family    RN x   Care Manager    Consultant/Specialist      >50% of visit spent in counseling and coordination of care y      Prophylaxis:  GI PPI   DVT SCD     Disposition:   Home with Family    HH/PT/OT/RN    SNF/LTC x   [facility]      ______________________________________________________________________  Attending Physician: Jesus A. Chandra, MD   _____________________________________________________________________________________________________  Procedures: see electronic medical records for all procedures/Xrays and details  which were not copied into this note but were reviewed prior to creation of Plan.      LABS:  Recent Labs   Basename 05/22/09 0245 05/21/09 0245   ? WBC 4.9 4.7   ? HGB 8.0* 7.9*   ? HCT 25.7* 25.9*   ? PLT 81* 91*     Recent Labs   Basename 05/22/09 0245 05/21/09 0245   ? NA 138 140   ? K 3.5 3.5   ? CL 108 104   ? CO2 25 24   ? BUN 6 7   ? CREA 0.7 0.7   ? GLU 94 88   ? CA 8.7 9.2   ? MG 1.5* --   ? PHOS -- --   ? URICA -- --     No results found for this basename: SGOT:3,GPT:3,AP:3,TBIL:3,TP:3,ALB:3,GLOB:3,GGT:3,AML:3,AMYP:3,LPSE:3,HLPSE:3 in the last 72 hours    No results found for this basename: INR:3,PTP:3,APTT:3, in the last 72 hours     No results found for this basename: FE:2,TIBC:2,PSAT:2,FERR:2, in the last 72 hours     No results found for this basename: PH:2,PCO2:2,PO2:2, in the last 72 hours    No results found for this basename: CPK:3,CKMB:3,TROPONINI:3, in the last 72 hours    Lab Results   Component Value Date/Time  POC  GLUCOSE 90 05/21/2009 11:26 AM    POC GLUCOSE 96 05/21/2009  7:48 AM    POC GLUCOSE 97 05/20/2009  9:28 PM    POC GLUCOSE 85 05/20/2009  5:18 PM    POC GLUCOSE 103 05/20/2009 12:18 PM     Lab Results   Component Value Date/Time    Color DARK YELLOW 05/09/2009 11:05 AM    Appearance CLEAR 05/09/2009 11:05 AM    Specific gravity 1.010 10/29/2008 11:00 AM    Specific gravity 1.024 05/09/2009 11:05 AM    pH 6.0 05/09/2009 11:05 AM    Protein TRACE 05/09/2009 11:05 AM    Glucose NEGATIVE  05/09/2009 11:05 AM    Ketone NEGATIVE  05/09/2009 11:05 AM    Bilirubin NEGATIVE  04/30/2009 12:14 AM    Urobilinogen 0.2 05/09/2009 11:05 AM    Nitrites NEGATIVE  05/09/2009 11:05 AM    Leukocyte Esterase SMALL 05/09/2009 11:05 AM    Epithelial cells 0-5 05/09/2009 11:05 AM    Bacteria NEGATIVE  05/09/2009 11:05 AM    WBC 5-10 05/09/2009 11:05 AM    RBC 10-20 05/09/2009 11:05 AM     MEDICATIONS:  Current facility-administered medications   Medication Dose Route Frequency   ? metoprolol  (LOPRESSOR ) tablet 12.5 mg  12.5 mg Oral Q12H   ? albuterol /ipratropium (DUONEB ) neb solution   1 Dose Nebulization Q4H PRN   ? lorazepam (ATIVAN) injection 1 mg  1 mg IntraVENous Q4H PRN   ? pantoprazole  (PROTONIX ) tablet 40 mg  40 mg Oral ACB   ? dextrose 5 % - 0.9% NaCl infusion    IntraVENous CONTINUOUS   ? rifaximin (XIFAXAN) tablet 400 mg  400 mg Oral TID   ? aspirin chewable tablet 81 mg  81 mg Oral DAILY   ? DISCONTD: metoprolol  (LOPRESSOR ) tablet 25 mg  25 mg Oral Q12H   ? albumin human 5% (BUMINATE) solution 25 g  25 g IntraVENous Q4H PRN   ? sodium chloride  (NS) flush 10 mL  10 mL InterCATHeter Q8H   ? sodium chloride  (NS) flush 10 mL  10 mL InterCATHeter PRN   ? heparin (porcine) pf 300 Units  300 Units InterCATHeter PRN   ? sodium chloride  (NS) flush 20 mL  20 mL InterCATHeter PRN   ? acetaminophen  (TYLENOL ) suppository 650 mg  650 mg Rectal Q6H PRN   ? saline peripheral flush 5 mL  5 mL InterCATHeter PRN       Electronically signed by Chandra Byers, MD at  05/23/2009  9:02 AM EDT

## 2009-05-22 NOTE — Progress Notes (Signed)
 Formatting of this note is different from the original.  Gastroenterology Progress Note    05/22/2009    Admit Date: 04/29/2009    Subjective:     Follow up for:  1)  cirrhosis    2) anemia    Patient is on Regular.    Pain: Patient complains of abdominal pain no.    Bowel Movements: brown and formed no blood    C/o confusion with Ativan doesn't want to take    Current facility-administered medications   Medication Dose Route Frequency   ? metoprolol  (LOPRESSOR ) tablet 12.5 mg  12.5 mg Oral Q12H   ? albuterol /ipratropium (DUONEB ) neb solution   1 Dose Nebulization Q4H PRN   ? lorazepam (ATIVAN) injection 1 mg  1 mg IntraVENous Q4H PRN   ? pantoprazole  (PROTONIX ) tablet 40 mg  40 mg Oral ACB   ? dextrose  5 % - 0.9% NaCl infusion    IntraVENous CONTINUOUS   ? rifaximin (XIFAXAN) tablet 400 mg  400 mg Oral TID   ? aspirin chewable tablet 81 mg  81 mg Oral DAILY   ? DISCONTD: metoprolol  (LOPRESSOR ) tablet 25 mg  25 mg Oral Q12H   ? albumin human 5% (BUMINATE) solution 25 g  25 g IntraVENous Q4H PRN   ? sodium chloride  (NS) flush 10 mL  10 mL InterCATHeter Q8H   ? sodium chloride  (NS) flush 10 mL  10 mL InterCATHeter PRN   ? heparin (porcine) pf 300 Units  300 Units InterCATHeter PRN   ? sodium chloride  (NS) flush 20 mL  20 mL InterCATHeter PRN   ? acetaminophen  (TYLENOL ) suppository 650 mg  650 mg Rectal Q6H PRN   ? saline peripheral flush 5 mL  5 mL InterCATHeter PRN         Objective:     Blood pressure 109/71, pulse 62, temperature 97.8 F (36.6 C), resp. rate 20, height 5' 2 (1.575 m), weight 144 lb 13.5 oz (65.7 kg), SpO2 94.00%, peak flow 40 L/min.        In: - Out: 650 (650 Urine)      Jaundiced  Chronically ill appearing  Alert and oriented  Spleen is enlarged, nontender  Umbilical hernia    Data Review  Recent Results (from the past 24 hour(s))   GLUCOSE, POC    Collection Time    05/21/09 11:26 AM   Component Value Range   ? POC GLUCOSE 90  65 - 105 (mg/dL)   CBC WITH AUTOMATED DIFF    Collection Time    05/22/09   2:45 AM   Component Value Range   ? WBC 4.9  3.6 - 11.0 (K/uL)   ? RBC 2.88 (*) 3.80 - 5.20 (M/uL)   ? HGB 8.0 (*) 11.5 - 16.0 (g/dL)   ? HCT 25.7 (*) 35.0 - 47.0 (%)   ? MCV 89.2  80.0 - 99.0 (FL)   ? MCH 27.8  26.0 - 34.0 (PG)   ? MCHC 31.1  30.0 - 36.5 (g/dL)   ? RDW 20.0 (*) 11.5 - 14.5 (%)   ? PLATELET 81 (*) 150 - 400 (K/uL)   ? NEUTROPHILS 57  32 - 75 (%)   ? LYMPHOCYTES 32  12 - 49 (%)   ? MONOCYTES 7  5 - 13 (%)   ? EOSINOPHILS 3  0 - 7 (%)   ? BASOPHILS 1  0 - 1 (%)   ? ABSOLUTE NEUTS 2.8  1.8 - 8.0 (K/UL)   ?  ABSOLUTE LYMPHS 1.6  0.8 - 3.5 (K/UL)   ? ABSOLUTE MONOS 0.3  0.0 - 1.0 (K/UL)   ? ABSOLUTE EOSINS 0.1  0.0 - 0.4 (K/UL)   ? ABSOLUTE BASOS 0.1  0.0 - 0.1 (K/UL)   ? RBC COMMENTS ANISOCYTOSIS     ? DF SMEAR SCANNED     METABOLIC PANEL, BASIC    Collection Time    05/22/09  2:45 AM   Component Value Range   ? Sodium 138  136 - 145 (MMOL/L)   ? Potassium 3.5  3.5 - 5.1 (MMOL/L)   ? Chloride 108  97 - 108 (MMOL/L)   ? CO2 25  21 - 32 (MMOL/L)   ? Anion gap 5  5 - 15 (mmol/L)   ? Glucose 94  65 - 100 (MG/DL)   ? BUN 6  6 - 20 (MG/DL)   ? Creatinine 0.7  0.6 - 1.3 (MG/DL)   ? BUN/Creatinine ratio 9 (*) 12 - 20 ( )   ? GFR est AA >60  >60 (ml/min/1.90m2)   ? GFR est non-AA >60  >60 (ml/min/1.53m2)   ? Calcium 8.7  8.5 - 10.1 (MG/DL)   MAGNESIUM     Collection Time    05/22/09  2:45 AM   Component Value Range   ? Magnesium  1.5 (*) 1.6 - 2.4 (MG/DL)         Assessment:     Patient Active Hospital Problem List:  Nausea with vomiting (05/01/2009)    Hypokalemia (05/01/2009)    Hepatic encephalopathy (05/01/2009)    Gait abnormality (05/01/2009)    Alcoholic cirrhosis of liver (05/01/2009)    Hypomagnesemia (05/01/2009)    Plan:     I agree with DC plans.  She should follow up with transplant clinic at [facility]  Consider DC of Ativan    Alm Shoemaker, M.D., MD  2:15 PM  05/22/2009      Electronically signed by Shoemaker Alm PARAS, MD at 05/22/2009  8:35 AM EDT

## 2009-05-22 NOTE — Progress Notes (Signed)
Gastroenterology Progress Note    05/22/2009    Admit Date: 04/29/2009    Subjective:     Follow up for:  1)  cirrhosis    2) anemia    Patient is on Regular.    Pain: Patient complains of abdominal pain no.    Bowel Movements: brown and formed no blood    C/o confusion with Ativan doesn't want to take            Current facility-administered medications   Medication Dose Route Frequency   ??? metoprolol (LOPRESSOR) tablet 12.5 mg  12.5 mg Oral Q12H   ??? albuterol/ipratropium (DUONEB) neb solution   1 Dose Nebulization Q4H PRN   ??? lorazepam (ATIVAN) injection 1 mg  1 mg IntraVENous Q4H PRN   ??? pantoprazole (PROTONIX) tablet 40 mg  40 mg Oral ACB   ??? dextrose 5 % - 0.9% NaCl infusion    IntraVENous CONTINUOUS   ??? rifaximin (XIFAXAN) tablet 400 mg  400 mg Oral TID   ??? aspirin chewable tablet 81 mg  81 mg Oral DAILY   ??? DISCONTD: metoprolol (LOPRESSOR) tablet 25 mg  25 mg Oral Q12H   ??? albumin human 5% (BUMINATE) solution 25 g  25 g IntraVENous Q4H PRN   ??? sodium chloride (NS) flush 10 mL  10 mL InterCATHeter Q8H   ??? sodium chloride (NS) flush 10 mL  10 mL InterCATHeter PRN   ??? heparin (porcine) pf 300 Units  300 Units InterCATHeter PRN   ??? sodium chloride (NS) flush 20 mL  20 mL InterCATHeter PRN   ??? acetaminophen (TYLENOL) suppository 650 mg  650 mg Rectal Q6H PRN   ??? saline peripheral flush 5 mL  5 mL InterCATHeter PRN            Objective:     Blood pressure 109/71, pulse 62, temperature 97.8 ??F (36.6 ??C), resp. rate 20, height 5\' 2"  (1.575 m), weight 144 lb 13.5 oz (65.7 kg), SpO2 94.00%, peak flow 40 L/min.         In: - Out: 650 (650 Urine)      Jaundiced  Chronically ill appearing  Alert and oriented  Spleen is enlarged, nontender  Umbilical hernia    Data Review  Recent Results (from the past 24 hour(s))   GLUCOSE, POC    Collection Time    05/21/09 11:26 AM   Component Value Range   ??? POC GLUCOSE 90  65 - 105 (mg/dL)   CBC WITH AUTOMATED DIFF    Collection Time    05/22/09  2:45 AM   Component Value Range    ??? WBC 4.9  3.6 - 11.0 (K/uL)   ??? RBC 2.88 (*) 3.80 - 5.20 (M/uL)   ??? HGB 8.0 (*) 11.5 - 16.0 (g/dL)   ??? HCT 25.7 (*) 35.0 - 47.0 (%)   ??? MCV 89.2  80.0 - 99.0 (FL)   ??? MCH 27.8  26.0 - 34.0 (PG)   ??? MCHC 31.1  30.0 - 36.5 (g/dL)   ??? RDW 20.0 (*) 11.5 - 14.5 (%)   ??? PLATELET 81 (*) 150 - 400 (K/uL)   ??? NEUTROPHILS 57  32 - 75 (%)   ??? LYMPHOCYTES 32  12 - 49 (%)   ??? MONOCYTES 7  5 - 13 (%)   ??? EOSINOPHILS 3  0 - 7 (%)   ??? BASOPHILS 1  0 - 1 (%)   ??? ABSOLUTE NEUTS 2.8  1.8 - 8.0 (K/UL)   ???  ABSOLUTE LYMPHS 1.6  0.8 - 3.5 (K/UL)   ??? ABSOLUTE MONOS 0.3  0.0 - 1.0 (K/UL)   ??? ABSOLUTE EOSINS 0.1  0.0 - 0.4 (K/UL)   ??? ABSOLUTE BASOS 0.1  0.0 - 0.1 (K/UL)   ??? RBC COMMENTS ANISOCYTOSIS     ??? DF SMEAR SCANNED     METABOLIC PANEL, BASIC    Collection Time    05/22/09  2:45 AM   Component Value Range   ??? Sodium 138  136 - 145 (MMOL/L)   ??? Potassium 3.5  3.5 - 5.1 (MMOL/L)   ??? Chloride 108  97 - 108 (MMOL/L)   ??? CO2 25  21 - 32 (MMOL/L)   ??? Anion gap 5  5 - 15 (mmol/L)   ??? Glucose 94  65 - 100 (MG/DL)   ??? BUN 6  6 - 20 (MG/DL)   ??? Creatinine 0.7  0.6 - 1.3 (MG/DL)   ??? BUN/Creatinine ratio 9 (*) 12 - 20 ( )   ??? GFR est AA >60  >60 (ml/min/1.46m2)   ??? GFR est non-AA >60  >60 (ml/min/1.37m2)   ??? Calcium 8.7  8.5 - 10.1 (MG/DL)   MAGNESIUM    Collection Time    05/22/09  2:45 AM   Component Value Range   ??? Magnesium 1.5 (*) 1.6 - 2.4 (MG/DL)               Assessment:     Patient Active Hospital Problem List:  Nausea with vomiting (05/01/2009)    Hypokalemia (05/01/2009)    Hepatic encephalopathy (05/01/2009)    Gait abnormality (05/01/2009)    Alcoholic cirrhosis of liver (05/01/2009)    Hypomagnesemia (05/01/2009)      Plan:     I agree with DC plans.  She should follow up with transplant clinic at [facility]  Consider DC of Ativan    Lewie Chamber, M.D., MD  2:15 PM  05/22/2009

## 2009-05-22 NOTE — Progress Notes (Signed)
Hospitalist Progress Note         NAME: Breanna Lester        DOB:  1961-07-19        MRN:  [id number]      Assessment & Plan   Sepsis syndrome :underlying pneumonia, fever, leukocytosis, resolved.   off vanco and LVQ.   Off Zosyn  No evidence of UTI or ascites(SBP), has an umbilical hernia that is not     reducible but overall adbomen is soft,   No diarrhea/ off lactulose      Anemia, s/p prbc, s/p egd with no source of bleed and path benign: egd revealed Schatzki's ring/ HH.  Continue ppi.  Will transfuse if hgb <7.9.    Altered mental status : multifactorial Sepsis, hypernatremia, ARF, hyperamonemia, Hepatic encephalopathy resolving. Much improved, hallucinations not evidenced.     Hepatic encephalopathy on rifaximin now, GI input appreciated. Hx cirrhosis.Taken off lactulose because of diarrhea,  Improved.  Will eventually need to resume her diuretics.    NSTEMI:on ASA and beta-blockers, Received  Lovenox earlier,   Cardiology followed, ischemic workup  as outpatient    Hypothyroidism(?) tsh suppressed < 0.01, discontinued levothyroxine, recheck tsh remains suppressed but Ft4 upper limit nl, stopped levothyroxine 9/28.  Repeat tsh in 4-6 weeks.    Gait abnormality / generalized debility  B12, folate normal, PT/OT input appreciated.  Will ask [facility] opinion on appropriateness for inpatient rehab. Can be d/c when arrangements made.    Nutrition:   tpn discontinued.  Tolerating diet well.    Left wrist Fracture: casted Ortho following, will need to follow outpatient.Repeat xray today "little change"    Acute respiratory failure:resolved, got extubated doing well off the ventilator.  cxr improved appearance.    ARF: resolved    Rhabdomyolisis: resolved    Hypernatremia: resolved, now off IVF.    Dispo: snf vs sah. prob Monday.         Subjective:     ADMIT [name] is a 48 y.o.?? Caucasian female whom presents with complaint of left forearm pain, nausea and vomiting.   Patient with a hx of cirrohsis presents ambulatory to ED with cc of vomiting. Pt's relative reports nausea and vomiting "all day".?? Per family member, when pt experiences N/V secondary to cirrhosis, it usually is associated with a fever. Pt's relative reports that pt fractured her ulna and radius 2 days ago after falling and that fractures were never "set", just "wrapped". Pt's relative also notes that pt's vision has been worsening, indicating this as the reason she fell 2 days ago   We were asked to admit for work up and evaluation of the above problems.   ????    Chief Complaint: Remains more lucid, deny's hallucinations. No changes.  Review of Systems:  Fever/chills N   Cough y   Sputum    y   SOB/DOE  N   Chest Pain N   Abdominal Pain N   Diarrhea N   Constipation N   Nausea/Vomiting N   Dysuria N   Tolerating PT y   Tolerating Diet y   Could not obtain due to AMS vs intubation         Objective:       VITALS:   Last 24hrs VS reviewed since prior progress note. Most recent are:  Visit Vitals   Item Reading   ??? BP 109/71   ??? Pulse 62   ??? Temp 97.8 ??F (36.6 ??C)   ??? Resp 20   ???  Ht 5\' 2"  (1.575 m)   ??? Wt 144 lb 13.5 oz (65.7 kg)   ??? SpO2 94%   ??? PF 40L/min           Intake/Output Summary (Last 24 hours) at 05/22/09 0936  Last data filed at 05/21/09 1300   Gross per 24 hour   Intake      0 ml   Output    650 ml   Net   -650 ml        Telemetry Reviewed:     PHYSICAL EXAM:  General:  awake , nad, cooperative   HEENT: Pupils unequal, R 3mm, L 2mm, eomi  Lungs:             Essentially clear and non-labored.  Heart:  Regular rhythm, tachycardic, SEM  Abdomen: Soft, Non distended, hypoactive BS,RUQ pain,  no rebound, umbilical hernia non reducible & NT.  Extremities: No edema, pulses present, brace in left arm  Neurologic:?? aa0x 3, no gross deficit  Psych:???? Alert and awake today    Lab Data Reviewed: (see below)    Medications Reviewed: (see below)    PMH/SH reviewed - no change compared to H&P    EGD BX 05/17/09:   1. Duodenum, biopsy:  Normal small intestinal mucosa.  2. Stomach, biopsy:  Benign fundic and antral mucosa with mild reactive gastropathy.  Giemsa stain for Helicobacter pylori is negative.  3. Mid/distal esophagus, biopsy:  Benign squamous mucosa with features suggestive of reflux  esophagitis.  No evidence of eosinophilic esophagitis or Barrett's.  ______________________________________________________________________        Care Plan discussed with:  Patient x   Family    RN x   Care Manager    Consultant/Specialist      >50% of visit spent in counseling and coordination of care y      Prophylaxis:  GI PPI   DVT SCD     Disposition:   Home with Family    HH/PT/OT/RN    SNF/LTC x   [facility]      ______________________________________________________________________  Attending Physician: Breanna Ayotte A. Bradly Bienenstock, MD   _____________________________________________________________________________________________________  Procedures: see electronic medical records for all procedures/Xrays and details  which were not copied into this note but were reviewed prior to creation of Plan.      LABS:  Recent Labs   Basename 05/22/09 0245 05/21/09 0245   ??? WBC 4.9 4.7   ??? HGB 8.0* 7.9*   ??? HCT 25.7* 25.9*   ??? PLT 81* 91*         Recent Labs   Basename 05/22/09 0245 05/21/09 0245   ??? NA 138 140   ??? K 3.5 3.5   ??? CL 108 104   ??? CO2 25 24   ??? BUN 6 7   ??? CREA 0.7 0.7   ??? GLU 94 88   ??? CA 8.7 9.2   ??? MG 1.5* --   ??? PHOS -- --   ??? URICA -- --           No results found for this basename: SGOT:3,GPT:3,AP:3,TBIL:3,TP:3,ALB:3,GLOB:3,GGT:3,AML:3,AMYP:3,LPSE:3,HLPSE:3 in the last 72 hours      No results found for this basename: INR:3,PTP:3,APTT:3, in the last 72 hours     No results found for this basename: FE:2,TIBC:2,PSAT:2,FERR:2, in the last 72 hours     No results found for this basename: PH:2,PCO2:2,PO2:2, in the last 72 hours      No results found for this basename: CPK:3,CKMB:3,TROPONINI:3, in the  last 72 hours    Lab Results    Component Value Date/Time    POC GLUCOSE 90 05/21/2009 11:26 AM    POC GLUCOSE 96 05/21/2009  7:48 AM    POC GLUCOSE 97 05/20/2009  9:28 PM    POC GLUCOSE 85 05/20/2009  5:18 PM    POC GLUCOSE 103 05/20/2009 12:18 PM         Lab Results   Component Value Date/Time    Color DARK YELLOW 05/09/2009 11:05 AM    Appearance CLEAR 05/09/2009 11:05 AM    Specific gravity 1.010 10/29/2008 11:00 AM    Specific gravity 1.024 05/09/2009 11:05 AM    pH 6.0 05/09/2009 11:05 AM    Protein TRACE 05/09/2009 11:05 AM    Glucose NEGATIVE  05/09/2009 11:05 AM    Ketone NEGATIVE  05/09/2009 11:05 AM    Bilirubin NEGATIVE  04/30/2009 12:14 AM    Urobilinogen 0.2 05/09/2009 11:05 AM    Nitrites NEGATIVE  05/09/2009 11:05 AM    Leukocyte Esterase SMALL 05/09/2009 11:05 AM    Epithelial cells 0-5 05/09/2009 11:05 AM    Bacteria NEGATIVE  05/09/2009 11:05 AM    WBC 5-10 05/09/2009 11:05 AM    RBC 10-20 05/09/2009 11:05 AM           MEDICATIONS:  Current facility-administered medications   Medication Dose Route Frequency   ??? metoprolol (LOPRESSOR) tablet 12.5 mg  12.5 mg Oral Q12H   ??? albuterol/ipratropium (DUONEB) neb solution   1 Dose Nebulization Q4H PRN   ??? lorazepam (ATIVAN) injection 1 mg  1 mg IntraVENous Q4H PRN   ??? pantoprazole (PROTONIX) tablet 40 mg  40 mg Oral ACB   ??? dextrose 5 % - 0.9% NaCl infusion    IntraVENous CONTINUOUS   ??? rifaximin (XIFAXAN) tablet 400 mg  400 mg Oral TID   ??? aspirin chewable tablet 81 mg  81 mg Oral DAILY   ??? DISCONTD: metoprolol (LOPRESSOR) tablet 25 mg  25 mg Oral Q12H   ??? albumin human 5% (BUMINATE) solution 25 g  25 g IntraVENous Q4H PRN   ??? sodium chloride (NS) flush 10 mL  10 mL InterCATHeter Q8H   ??? sodium chloride (NS) flush 10 mL  10 mL InterCATHeter PRN   ??? heparin (porcine) pf 300 Units  300 Units InterCATHeter PRN   ??? sodium chloride (NS) flush 20 mL  20 mL InterCATHeter PRN   ??? acetaminophen (TYLENOL) suppository 650 mg  650 mg Rectal Q6H PRN   ??? saline peripheral flush 5 mL  5 mL InterCATHeter PRN

## 2009-05-23 LAB — GLUCOSE, POC
Glucose (POC): 110 mg/dL — ABNORMAL HIGH (ref 65–105)
Glucose (POC): 106 mg/dL — ABNORMAL HIGH (ref 65–105)

## 2009-05-23 MED ADMIN — aspirin chewable tablet 81 mg: ORAL | @ 15:00:00

## 2009-05-23 MED ADMIN — sodium chloride (NS) flush 10 mL: @ 11:00:00 | NDC 87701099893

## 2009-05-23 MED ADMIN — rifaximin (XIFAXAN) tablet 400 mg: ORAL | @ 22:00:00 | NDC 65649030103

## 2009-05-23 MED ADMIN — lorazepam (ATIVAN) injection 1 mg: INTRAVENOUS | @ 04:00:00 | NDC 10019010239

## 2009-05-23 MED ADMIN — metoprolol (LOPRESSOR) tablet 12.5 mg: ORAL | @ 15:00:00 | NDC 51079025501

## 2009-05-23 MED ADMIN — lorazepam (ATIVAN) injection 1 mg: INTRAVENOUS | @ 16:00:00 | NDC 10019010239

## 2009-05-23 MED ADMIN — metoprolol (LOPRESSOR) tablet 12.5 mg: ORAL | @ 02:00:00 | NDC 51079025501

## 2009-05-23 MED ADMIN — rifaximin (XIFAXAN) tablet 400 mg: ORAL | @ 15:00:00 | NDC 65649030105

## 2009-05-23 MED ADMIN — rifaximin (XIFAXAN) tablet 400 mg: ORAL | @ 02:00:00 | NDC 65649030105

## 2009-05-23 MED ADMIN — sodium chloride (NS) flush 10 mL: @ 02:00:00 | NDC 87701099893

## 2009-05-23 MED ADMIN — pantoprazole (PROTONIX) tablet 40 mg: ORAL | @ 11:00:00 | NDC 00008084181

## 2009-05-23 MED ADMIN — sodium chloride (NS) flush 10 mL: @ 22:00:00 | NDC 87701099893

## 2009-05-23 MED FILL — SALINE FLUSH INJECTION SYRINGE: INTRAMUSCULAR | Qty: 20

## 2009-05-23 MED FILL — LORAZEPAM 2 MG/ML IJ SOLN: 2 mg/mL | INTRAMUSCULAR | Qty: 1

## 2009-05-23 MED FILL — SALINE FLUSH INJECTION SYRINGE: INTRAMUSCULAR | Qty: 10

## 2009-05-23 MED FILL — ASPIRIN 81 MG CHEWABLE TAB: 81 mg | ORAL | Qty: 1

## 2009-05-23 MED FILL — METOPROLOL TARTRATE 25 MG TAB: 25 mg | ORAL | Qty: 1

## 2009-05-23 MED FILL — XIFAXAN 200 MG TABLET: 200 mg | ORAL | Qty: 2

## 2009-05-23 MED FILL — PROTONIX 40 MG TABLET,DELAYED RELEASE: 40 mg | ORAL | Qty: 1

## 2009-05-23 NOTE — Progress Notes (Signed)
 Formatting of this note is different from the original.       Hospitalist Progress Note         NAME: Breanna Lester        DOB:  November 17, 1960        MRN:  [id number]      Assessment & Plan   Hepatic encephalopathy/cirrhosis on rifaximin, GI input appreciated. Hx cirrhosis.Taken off lactulose because of diarrhea,  Improved.  Will eventually need to resume her diuretics.  bp remains on low side.    Deconditioning/unstable gait: cont pt/ot and anticipate d/c Monday to [facility] or snf.    Sepsis syndrome :underlying pneumonia, fever, leukocytosis, resolved.   off vanco and LVQ.   Off Zosyn  No evidence of UTI or ascites(SBP), has an umbilical hernia that is not     reducible but overall adbomen is soft,   No diarrhea/ off lactulose    Anemia, s/p prbc, s/p egd with no source of bleed and path benign: egd revealed Schatzki's ring/ HH.  Continue ppi.  Will transfuse if hgb <7.9.    Altered mental status : multifactorial Sepsis, hypernatremia, ARF, hyperamonemia, Hepatic encephalopathy resolving. Much improved, hallucinations not evidenced.    NSTEMI:on ASA and beta-blockers, Received  Lovenox  earlier,   Cardiology followed, ischemic workup  as outpatient.    Hypothyroidism(?) tsh suppressed < 0.01, discontinued levothyroxine, recheck tsh remains suppressed but Ft4 upper limit nl, stopped levothyroxine 9/28.  Repeat tsh in 4-6 weeks.    Gait abnormality / generalized debility  B12, folate normal, PT/OT input appreciated.  Will ask [facility] opinion on appropriateness for inpatient rehab. Can be d/c when arrangements made.    Nutrition:   tpn discontinued.  Tolerating diet well.    Left wrist Fracture: casted Ortho following, will need to follow outpatient.Repeat xray today little change    Acute respiratory failure:resolved, got extubated doing well off the ventilator.  cxr improved appearance.    ARF: resolved    Rhabdomyolisis: resolved    Hypernatremia: resolved, now off IVF.    Dispo: snf vs [facility]. prob Monday.      Subjective:      ADMIT Breanna Lester is a 48 y.o. Caucasian female whom presents with complaint of left forearm pain, nausea and vomiting.  Patient with a hx of cirrohsis presents ambulatory to ED with cc of vomiting. Pt's relative reports nausea and vomiting all day. Per family member, when pt experiences N/V secondary to cirrhosis, it usually is associated with a fever. Pt's relative reports that pt fractured her ulna and radius 2 days ago after falling and that fractures were never set, just wrapped. Pt's relative also notes that pt's vision has been worsening, indicating this as the reason she fell 2 days ago   We were asked to admit for work up and evaluation of the above problems.       Chief Complaint: Remains more lucid, deny's hallucinations. No changes.  Review of Systems:  Fever/chills N   Cough y   Sputum    y   SOB/DOE  N   Chest Pain N   Abdominal Pain N   Diarrhea N   Constipation N   Nausea/Vomiting N   Dysuria N   Tolerating PT y   Tolerating Diet y   Could not obtain due to AMS vs intubation        Objective:     VITALS:   Last 24hrs VS reviewed since prior progress note. Most recent are:  Visit Vitals  Item Reading   ? BP 105/52   ? Pulse 68   ? Temp 98 F (36.7 C)   ? Resp 20   ? Ht 5' 2 (1.575 m)   ? Wt 144 lb 13.5 oz (65.7 kg)   ? SpO2 94%   ? PF 40L/min     Intake/Output Summary (Last 24 hours) at 05/23/09 1803  Last data filed at 05/23/09 0600   Gross per 24 hour   Intake 3147.5 ml   Output    800 ml   Net 2347.5 ml       Telemetry Reviewed:     PHYSICAL EXAM:  General:  awake , nad, cooperative   HEENT: Pupils unequal, R 3mm, L 2mm, eomi  Lungs:             Essentially clear and non-labored.  Heart:  Regular rhythm, tachycardic, SEM  Abdomen: Soft, Non distended, hypoactive BS,RUQ pain,  no rebound, umbilical hernia non reducible & NT.  Extremities: No edema, pulses present, brace in left arm  Neurologic: aa0x 3, no gross deficit  Psych: Alert and awake today    Lab Data Reviewed: (see  below)    Medications Reviewed: (see below)    PMH/SH reviewed - no change compared to H&P    EGD BX 05/17/09:  1. Duodenum, biopsy:  Normal small intestinal mucosa.  2. Stomach, biopsy:  Benign fundic and antral mucosa with mild reactive gastropathy.  Giemsa stain for Helicobacter pylori is negative.  3. Mid/distal esophagus, biopsy:  Benign squamous mucosa with features suggestive of reflux  esophagitis.  No evidence of eosinophilic esophagitis or Barrett's.  ______________________________________________________________________      Care Plan discussed with:  Patient x   Family    RN x   Care Manager    Consultant/Specialist      >50% of visit spent in counseling and coordination of care y      Prophylaxis:  GI PPI   DVT SCD     Disposition:   Home with Family    HH/PT/OT/RN    SNF/LTC x   [facility]      ______________________________________________________________________  Attending Physician: Jesus A. Chandra, MD   _____________________________________________________________________________________________________  Procedures: see electronic medical records for all procedures/Xrays and details  which were not copied into this note but were reviewed prior to creation of Plan.      LABS:  Recent Labs   Basename 05/22/09 0245 05/21/09 0245   ? WBC 4.9 4.7   ? HGB 8.0* 7.9*   ? HCT 25.7* 25.9*   ? PLT 81* 91*     Recent Labs   Basename 05/22/09 0245 05/21/09 0245   ? NA 138 140   ? K 3.5 3.5   ? CL 108 104   ? CO2 25 24   ? BUN 6 7   ? CREA 0.7 0.7   ? GLU 94 88   ? CA 8.7 9.2   ? MG 1.5* --   ? PHOS -- --   ? URICA -- --     No results found for this basename: SGOT:3,GPT:3,AP:3,TBIL:3,TP:3,ALB:3,GLOB:3,GGT:3,AML:3,AMYP:3,LPSE:3,HLPSE:3 in the last 72 hours    No results found for this basename: INR:3,PTP:3,APTT:3, in the last 72 hours     No results found for this basename: FE:2,TIBC:2,PSAT:2,FERR:2, in the last 72 hours     No results found for this basename: PH:2,PCO2:2,PO2:2, in the last 72 hours    No results found  for this basename: CPK:3,CKMB:3,TROPONINI:3, in the last  72 hours    Lab Results   Component Value Date/Time    POC GLUCOSE 106 05/23/2009  4:31 PM    POC GLUCOSE 110 05/22/2009  9:52 PM    POC GLUCOSE 154 05/22/2009 12:03 PM    POC GLUCOSE 104 05/22/2009  7:20 AM    POC GLUCOSE 105 05/21/2009  9:47 PM     Lab Results   Component Value Date/Time    Color DARK YELLOW 05/09/2009 11:05 AM    Appearance CLEAR 05/09/2009 11:05 AM    Specific gravity 1.010 10/29/2008 11:00 AM    Specific gravity 1.024 05/09/2009 11:05 AM    pH 6.0 05/09/2009 11:05 AM    Protein TRACE 05/09/2009 11:05 AM    Glucose NEGATIVE  05/09/2009 11:05 AM    Ketone NEGATIVE  05/09/2009 11:05 AM    Bilirubin NEGATIVE  04/30/2009 12:14 AM    Urobilinogen 0.2 05/09/2009 11:05 AM    Nitrites NEGATIVE  05/09/2009 11:05 AM    Leukocyte Esterase SMALL 05/09/2009 11:05 AM    Epithelial cells 0-5 05/09/2009 11:05 AM    Bacteria NEGATIVE  05/09/2009 11:05 AM    WBC 5-10 05/09/2009 11:05 AM    RBC 10-20 05/09/2009 11:05 AM     MEDICATIONS:  Current facility-administered medications   Medication Dose Route Frequency   ? metoprolol  (LOPRESSOR ) tablet 12.5 mg  12.5 mg Oral Q12H   ? albuterol /ipratropium (DUONEB ) neb solution   1 Dose Nebulization Q4H PRN   ? lorazepam (ATIVAN) injection 1 mg  1 mg IntraVENous Q4H PRN   ? pantoprazole  (PROTONIX ) tablet 40 mg  40 mg Oral ACB   ? DISCONTD: dextrose 5 % - 0.9% NaCl infusion    IntraVENous CONTINUOUS   ? rifaximin (XIFAXAN) tablet 400 mg  400 mg Oral TID   ? aspirin chewable tablet 81 mg  81 mg Oral DAILY   ? albumin human 5% (BUMINATE) solution 25 g  25 g IntraVENous Q4H PRN   ? sodium chloride  (NS) flush 10 mL  10 mL InterCATHeter Q8H   ? sodium chloride  (NS) flush 10 mL  10 mL InterCATHeter PRN   ? heparin (porcine) pf 300 Units  300 Units InterCATHeter PRN   ? sodium chloride  (NS) flush 20 mL  20 mL InterCATHeter PRN   ? acetaminophen  (TYLENOL ) suppository 650 mg  650 mg Rectal Q6H PRN   ? saline peripheral flush 5 mL  5 mL  InterCATHeter PRN       Electronically signed by Chandra Byers, MD at 05/23/2009 10:18 PM EDT

## 2009-05-23 NOTE — Progress Notes (Signed)
 Formatting of this note might be different from the original.  Ortho-    Now almost 4 weeks S/P left distal radius/ulna fracture.  Recent x-rays show acceptable alignment.  Cast will need to be removed in about two weeks, call office [phone]) for appointment.  Electronically signed by Boyd Lupita NOVAK, MD at 05/23/2009  2:10 PM EDT

## 2009-05-23 NOTE — Progress Notes (Addendum)
Ortho-    Now almost 4 weeks S/P left distal radius/ulna fracture.  Recent x-rays show acceptable alignment.  Cast will need to be removed in about two weeks, call office [phone]) for appointment.

## 2009-05-23 NOTE — Progress Notes (Signed)
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 Hx cirrhosis.Taken off lactulose because of diarrhea,  Improved.  Will eventually need to resume her diuretics.  bp remains on low side.    Deconditioning/unstable gait: cont pt/ot and anticipate d/c Monday to [facility] or [facility].    Sepsis syndrome :underlying pneumonia, fever, leukocytosis, resolved.   off vanco and LVQ.   Off Zosyn  No evidence of UTI or ascites(SBP), has an umbilical hernia that is not     reducible but overall adbomen is soft,   No diarrhea/ off lactulose      Anemia, s/p prbc, s/p egd with no source of bleed and path benign: egd revealed Schatzki's ring/ HH.  Continue ppi.  Will transfuse if hgb <7.9.    Altered mental status : multifactorial Sepsis, hypernatremia, ARF, hyperamonemia, Hepatic encephalopathy resolving. Much improved, hallucinations not evidenced.    NSTEMI:on ASA and beta-blockers, Received  Lovenox earlier,   Cardiology followed, ischemic workup  as outpatient.    Hypothyroidism(?) tsh suppressed < 0.01, discontinued levothyroxine, recheck tsh remains suppressed but Ft4 upper limit nl, stopped levothyroxine 9/28.  Repeat tsh in 4-6 weeks.    Gait abnormality / generalized debility  B12, folate normal, PT/OT input appreciated.  Will ask [facility] opinion on appropriateness for inpatient rehab. Can be d/c when arrangements made.    Nutrition:   tpn discontinued.  Tolerating diet well.    Left wrist Fracture: casted Ortho following, will need to follow outpatient.Repeat xray today "little change"    Acute respiratory failure:resolved, got extubated doing well off the ventilator.  cxr improved appearance.    ARF: resolved    Rhabdomyolisis: resolved    Hypernatremia: resolved, now off IVF.    Dispo: snf vs [facility]. prob Monday.         Subjective:      ADMIT NWG:NFAOZ is a 48 y.o.?? Caucasian female whom presents with complaint of left forearm pain, nausea and vomiting.  Patient with a hx of cirrohsis presents ambulatory to ED with cc of vomiting. Pt's relative reports nausea and vomiting "all day".?? Per family member, when pt experiences N/V secondary to cirrhosis, it usually is associated with a fever. Pt's relative reports that pt fractured her ulna and radius 2 days ago after falling and that fractures were never "set", just "wrapped". Pt's relative also notes that pt's vision has been worsening, indicating this as the reason she fell 2 days ago   We were asked to admit for work up and evaluation of the above problems.   ????    Chief Complaint: Remains more lucid, deny's hallucinations. No changes.  Review of Systems:  Fever/chills N   Cough y   Sputum    y   SOB/DOE  N   Chest Pain N   Abdominal Pain N   Diarrhea N   Constipation N   Nausea/Vomiting N   Dysuria N   Tolerating PT y   Tolerating Diet y   Could not obtain due to AMS vs intubation         Objective:       VITALS:   Last 24hrs VS reviewed since prior progress note. Most recent are:  Visit Vitals   Item Reading   ??? BP  105/52   ??? Pulse 68   ??? Temp 98 ??F (36.7 ??C)   ??? Resp 20   ??? Ht 5\' 2"  (1.575 m)   ??? Wt 144 lb 13.5 oz (65.7 kg)   ??? SpO2 94%   ??? PF 40L/min           Intake/Output Summary (Last 24 hours) at 05/23/09 1803  Last data filed at 05/23/09 0600   Gross per 24 hour   Intake 3147.5 ml   Output    800 ml   Net 2347.5 ml        Telemetry Reviewed:     PHYSICAL EXAM:  General:  awake , nad, cooperative   HEENT: Pupils unequal, R 3mm, L 2mm, eomi  Lungs:             Essentially clear and non-labored.  Heart:  Regular rhythm, tachycardic, SEM  Abdomen: Soft, Non distended, hypoactive BS,RUQ pain,  no rebound, umbilical hernia non reducible & NT.  Extremities: No edema, pulses present, brace in left arm  Neurologic:?? aa0x 3, no gross deficit  Psych:???? Alert and awake today     Lab Data Reviewed: (see below)    Medications Reviewed: (see below)    PMH/SH reviewed - no change compared to H&P    EGD BX 05/17/09:  1. Duodenum, biopsy:  Normal small intestinal mucosa.  2. Stomach, biopsy:  Benign fundic and antral mucosa with mild reactive gastropathy.  Giemsa stain for Helicobacter pylori is negative.  3. Mid/distal esophagus, biopsy:  Benign squamous mucosa with features suggestive of reflux  esophagitis.  No evidence of eosinophilic esophagitis or Barrett's.  ______________________________________________________________________        Care Plan discussed with:  Patient x   Family    RN x   Care Manager    Consultant/Specialist      >50% of visit spent in counseling and coordination of care y      Prophylaxis:  GI PPI   DVT SCD     Disposition:   Home with Family    HH/PT/OT/RN    SNF/LTC x   [facility]      ______________________________________________________________________  Attending Physician: Mung Rinker A. Bradly Bienenstock, MD   _____________________________________________________________________________________________________  Procedures: see electronic medical records for all procedures/Xrays and details  which were not copied into this note but were reviewed prior to creation of Plan.      LABS:  Recent Labs   Basename 05/22/09 0245 05/21/09 0245   ??? WBC 4.9 4.7   ??? HGB 8.0* 7.9*   ??? HCT 25.7* 25.9*   ??? PLT 81* 91*         Recent Labs   Basename 05/22/09 0245 05/21/09 0245   ??? NA 138 140   ??? K 3.5 3.5   ??? CL 108 104   ??? CO2 25 24   ??? BUN 6 7   ??? CREA 0.7 0.7   ??? GLU 94 88   ??? CA 8.7 9.2   ??? MG 1.5* --   ??? PHOS -- --   ??? URICA -- --           No results found for this basename: SGOT:3,GPT:3,AP:3,TBIL:3,TP:3,ALB:3,GLOB:3,GGT:3,AML:3,AMYP:3,LPSE:3,HLPSE:3 in the last 72 hours      No results found for this basename: INR:3,PTP:3,APTT:3, in the last 72 hours     No results found for this basename: FE:2,TIBC:2,PSAT:2,FERR:2, in the last 72 hours      No results found for this basename: PH:2,PCO2:2,PO2:2, in the last  72 hours      No results found for this basename: CPK:3,CKMB:3,TROPONINI:3, in the last 72 hours    Lab Results   Component Value Date/Time    POC GLUCOSE 106 05/23/2009  4:31 PM    POC GLUCOSE 110 05/22/2009  9:52 PM    POC GLUCOSE 154 05/22/2009 12:03 PM    POC GLUCOSE 104 05/22/2009  7:20 AM    POC GLUCOSE 105 05/21/2009  9:47 PM         Lab Results   Component Value Date/Time    Color DARK YELLOW 05/09/2009 11:05 AM    Appearance CLEAR 05/09/2009 11:05 AM    Specific gravity 1.010 10/29/2008 11:00 AM    Specific gravity 1.024 05/09/2009 11:05 AM    pH 6.0 05/09/2009 11:05 AM    Protein TRACE 05/09/2009 11:05 AM    Glucose NEGATIVE  05/09/2009 11:05 AM    Ketone NEGATIVE  05/09/2009 11:05 AM    Bilirubin NEGATIVE  04/30/2009 12:14 AM    Urobilinogen 0.2 05/09/2009 11:05 AM    Nitrites NEGATIVE  05/09/2009 11:05 AM    Leukocyte Esterase SMALL 05/09/2009 11:05 AM    Epithelial cells 0-5 05/09/2009 11:05 AM    Bacteria NEGATIVE  05/09/2009 11:05 AM    WBC 5-10 05/09/2009 11:05 AM    RBC 10-20 05/09/2009 11:05 AM           MEDICATIONS:  Current facility-administered medications   Medication Dose Route Frequency   ??? metoprolol (LOPRESSOR) tablet 12.5 mg  12.5 mg Oral Q12H   ??? albuterol/ipratropium (DUONEB) neb solution   1 Dose Nebulization Q4H PRN   ??? lorazepam (ATIVAN) injection 1 mg  1 mg IntraVENous Q4H PRN   ??? pantoprazole (PROTONIX) tablet 40 mg  40 mg Oral ACB   ??? DISCONTD: dextrose 5 % - 0.9% NaCl infusion    IntraVENous CONTINUOUS   ??? rifaximin (XIFAXAN) tablet 400 mg  400 mg Oral TID   ??? aspirin chewable tablet 81 mg  81 mg Oral DAILY   ??? albumin human 5% (BUMINATE) solution 25 g  25 g IntraVENous Q4H PRN   ??? sodium chloride (NS) flush 10 mL  10 mL InterCATHeter Q8H   ??? sodium chloride (NS) flush 10 mL  10 mL InterCATHeter PRN   ??? heparin (porcine) pf 300 Units  300 Units InterCATHeter PRN   ??? sodium chloride (NS) flush 20 mL  20 mL InterCATHeter PRN    ??? acetaminophen (TYLENOL) suppository 650 mg  650 mg Rectal Q6H PRN   ??? saline peripheral flush 5 mL  5 mL InterCATHeter PRN

## 2009-05-24 LAB — GLUCOSE, POC
Glucose (POC): 110 mg/dL — ABNORMAL HIGH (ref 65–105)
Glucose (POC): 100 mg/dL (ref 65–105)
Glucose (POC): 171 mg/dL — ABNORMAL HIGH (ref 65–105)
Glucose (POC): 92 mg/dL (ref 65–105)
Glucose (POC): 96 mg/dL (ref 65–105)
Glucose (POC): 107 mg/dL — ABNORMAL HIGH (ref 65–105)
Glucose (POC): 103 mg/dL (ref 65–105)

## 2009-05-24 LAB — METABOLIC PANEL, BASIC
Anion gap: 7 mmol/L (ref 5–15)
BUN/Creatinine ratio: 6 — ABNORMAL LOW (ref 12–20)
BUN: 4 MG/DL — ABNORMAL LOW (ref 6–20)
CO2: 25 MMOL/L (ref 21–32)
Calcium: 9 MG/DL (ref 8.5–10.1)
Chloride: 106 MMOL/L (ref 97–108)
Creatinine: 0.7 MG/DL (ref 0.6–1.3)
GFR est AA: >60 mL/min/{1.73_m2} (ref >60)
GFR est non-AA: >60 mL/min/{1.73_m2} (ref >60)
Glucose: 98 MG/DL (ref 65–100)
Potassium: 3.3 MMOL/L — ABNORMAL LOW (ref 3.5–5.1)
Sodium: 138 MMOL/L (ref 136–145)

## 2009-05-24 LAB — MAGNESIUM: Magnesium: 1.4 MG/DL — ABNORMAL LOW (ref 1.6–2.4)

## 2009-05-24 MED ADMIN — sodium chloride (NS) flush 10 mL: @ 22:00:00 | NDC 87701099893

## 2009-05-24 MED ADMIN — sodium chloride (NS) flush 10 mL: @ 10:00:00 | NDC 87701099893

## 2009-05-24 MED ADMIN — aspirin chewable tablet 81 mg: ORAL | @ 13:00:00

## 2009-05-24 MED ADMIN — potassium chloride SR (KLOR-CON 10) tablet 20 mEq: ORAL | @ 13:00:00 | NDC 00074780419

## 2009-05-24 MED ADMIN — rifaximin (XIFAXAN) tablet 400 mg: ORAL | @ 13:00:00 | NDC 65649030103

## 2009-05-24 MED ADMIN — magnesium sulfate 2 g/50 ml IVPB: INTRAVENOUS | @ 13:00:00 | NDC 00409672924

## 2009-05-24 MED ADMIN — sodium chloride (NS) flush 10 mL: @ 02:00:00 | NDC 87701099893

## 2009-05-24 MED ADMIN — metoprolol (LOPRESSOR) tablet 12.5 mg: ORAL | @ 01:00:00 | NDC 51079025501

## 2009-05-24 MED ADMIN — rifaximin (XIFAXAN) tablet 400 mg: ORAL | @ 22:00:00 | NDC 65649030103

## 2009-05-24 MED ADMIN — pantoprazole (PROTONIX) tablet 40 mg: ORAL | @ 10:00:00 | NDC 00008084181

## 2009-05-24 MED ADMIN — metoprolol (LOPRESSOR) tablet 12.5 mg: ORAL | @ 14:00:00 | NDC 51079025501

## 2009-05-24 MED ADMIN — rifaximin (XIFAXAN) tablet 400 mg: ORAL | @ 01:00:00 | NDC 65649030105

## 2009-05-24 MED FILL — XIFAXAN 200 MG TABLET: 200 mg | ORAL | Qty: 2

## 2009-05-24 MED FILL — ASPIRIN 81 MG CHEWABLE TAB: 81 mg | ORAL | Qty: 1

## 2009-05-24 MED FILL — SALINE FLUSH INJECTION SYRINGE: INTRAMUSCULAR | Qty: 10

## 2009-05-24 MED FILL — METOPROLOL TARTRATE 25 MG TAB: 25 mg | ORAL | Qty: 1

## 2009-05-24 MED FILL — MAGNESIUM SULFATE 2 GRAM/50 ML IVPB: 2 gram/50 mL (4 %) | INTRAVENOUS | Qty: 50

## 2009-05-24 MED FILL — PROTONIX 40 MG TABLET,DELAYED RELEASE: 40 mg | ORAL | Qty: 1

## 2009-05-24 MED FILL — K-TAB 10 MEQ TABLET,EXTENDED RELEASE: 10 mEq | ORAL | Qty: 2

## 2009-05-24 NOTE — Progress Notes (Signed)
 Formatting of this note is different from the original.       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 Hx cirrhosis.Taken off lactulose because of diarrhea,  Improved.  Will eventually need to resume her diuretics.  bp remains on low side.  Her mentation remains much improved.    Deconditioning/unstable gait: cont pt/ot and anticipate d/c soon to [facility] .    --hypokalemia/hypomagnesemia replaced, follow    Sepsis syndrome :underlying pneumonia, fever, leukocytosis, resolved.   off vanco and LVQ.   Off Zosyn  No evidence of UTI or ascites(SBP), has an umbilical hernia that is not     reducible but overall adbomen is soft,   No diarrhea/ off lactulose    Anemia, s/p prbc, s/p egd with no source of bleed and path benign: egd revealed Schatzki's ring/ HH.  Continue ppi.  Will transfuse if hgb <7.9.    Altered mental status : multifactorial Sepsis, hypernatremia, ARF, hyperamonemia, Hepatic encephalopathy resolving. Much improved, hallucinations not evidenced.    NSTEMI:on ASA and beta-blockers, Received  Lovenox  earlier,   Cardiology followed, ischemic workup  as outpatient.    Hypothyroidism(?) tsh suppressed < 0.01, discontinued levothyroxine, recheck tsh remains suppressed but Ft4 upper limit nl, stopped levothyroxine 9/28.  Repeat tsh in 4-6 weeks.    Gait abnormality / generalized debility  B12, folate normal, PT/OT input appreciated.  Will ask [facility] opinion on appropriateness for inpatient rehab. Can be d/c when arrangements made.    Nutrition:   tpn discontinued.  Tolerating diet well.    Left wrist Fracture: casted Ortho following, will need to follow outpatient.Repeat xray today little change    Acute respiratory failure:resolved, got extubated doing well off the ventilator.  cxr improved appearance.    ARF: resolved    Rhabdomyolisis: resolved    Hypernatremia:  resolved, now off IVF.    Dispo: snf vs [facility]. prob Monday.      Subjective:     ADMIT [name] is a 48 y.o. Caucasian female whom presents with complaint of left forearm pain, nausea and vomiting.  Patient with a hx of cirrohsis presents ambulatory to ED with cc of vomiting. Pt's relative reports nausea and vomiting all day. Per family member, when pt experiences N/V secondary to cirrhosis, it usually is associated with a fever. Pt's relative reports that pt fractured her ulna and radius 2 days ago after falling and that fractures were never set, just wrapped. Pt's relative also notes that pt's vision has been worsening, indicating this as the reason she fell 2 days ago   We were asked to admit for work up and evaluation of the above problems.       Chief Complaint: Remains more lucid, deny's hallucinations. No changes.  Review of Systems:  Fever/chills N   Cough y   Sputum    y   SOB/DOE  N   Chest Pain N   Abdominal Pain N   Diarrhea N   Constipation N   Nausea/Vomiting N   Dysuria N   Tolerating PT y   Tolerating Diet y   Could not obtain due to AMS vs intubation        Objective:     VITALS:   Last 24hrs VS reviewed  since prior progress note. Most recent are:  Visit Vitals   Item Reading   ? BP 112/55   ? Pulse 62   ? Temp 98 F (36.7 C)   ? Resp 18   ? Ht 5' 2 (1.575 m)   ? Wt 144 lb 13.5 oz (65.7 kg)   ? SpO2 95%   ? PF 40L/min     Intake/Output Summary (Last 24 hours) at 05/24/09 2127  Last data filed at 05/23/09 2246   Gross per 24 hour   Intake      0 ml   Output      1 ml   Net     -1 ml       Telemetry Reviewed:     PHYSICAL EXAM:  General:  awake , nad, cooperative   HEENT: Pupils unequal, R 3mm, L 2mm, eomi  Lungs:             Essentially clear and non-labored.  Heart:  Regular rhythm, tachycardic, SEM  Abdomen: Soft, Non distended, hypoactive BS,RUQ pain,  no rebound, umbilical hernia non reducible & NT.  Extremities: No edema, pulses present, brace in left arm  Neurologic: aa0x 3, no  gross deficit  Psych: Alert and awake today    Lab Data Reviewed: (see below)    Medications Reviewed: (see below)    PMH/SH reviewed - no change compared to H&P    EGD BX 05/17/09:  1. Duodenum, biopsy:  Normal small intestinal mucosa.  2. Stomach, biopsy:  Benign fundic and antral mucosa with mild reactive gastropathy.  Giemsa stain for Helicobacter pylori is negative.  3. Mid/distal esophagus, biopsy:  Benign squamous mucosa with features suggestive of reflux  esophagitis.  No evidence of eosinophilic esophagitis or Barrett's.  ______________________________________________________________________      Care Plan discussed with:  Patient x   Family    RN x   Care Manager    Consultant/Specialist      >50% of visit spent in counseling and coordination of care y      Prophylaxis:  GI PPI   DVT SCD     Disposition:   Home with Family    HH/PT/OT/RN    SNF/LTC x   [facility]      ______________________________________________________________________  Attending Physician: Jesus A. Chandra, MD   _____________________________________________________________________________________________________  Procedures: see electronic medical records for all procedures/Xrays and details  which were not copied into this note but were reviewed prior to creation of Plan.      LABS:  Recent Labs   Basename 05/22/09 0245   ? WBC 4.9   ? HGB 8.0*   ? HCT 25.7*   ? PLT 81*     Recent Labs   Basename 05/24/09 0215 05/22/09 0245   ? NA 138 138   ? K 3.3* 3.5   ? CL 106 108   ? CO2 25 25   ? BUN 4* 6   ? CREA 0.7 0.7   ? GLU 98 94   ? CA 9.0 8.7   ? MG 1.4* 1.5*   ? PHOS -- --   ? URICA -- --     No results found for this basename: SGOT:3,GPT:3,AP:3,TBIL:3,TP:3,ALB:3,GLOB:3,GGT:3,AML:3,AMYP:3,LPSE:3,HLPSE:3 in the last 72 hours    No results found for this basename: INR:3,PTP:3,APTT:3, in the last 72 hours     No results found for this basename: FE:2,TIBC:2,PSAT:2,FERR:2, in the last 72 hours     No results found for this basename:  PH:2,PCO2:2,PO2:2, in  the last 72 hours    No results found for this basename: CPK:3,CKMB:3,TROPONINI:3, in the last 72 hours    Lab Results   Component Value Date/Time    POC GLUCOSE 165 05/24/2009  7:53 PM    POC GLUCOSE 103 05/24/2009  4:06 PM    POC GLUCOSE 107 05/24/2009 11:13 AM    POC GLUCOSE 92 05/24/2009  7:40 AM    POC GLUCOSE 110 05/23/2009  8:23 PM     Lab Results   Component Value Date/Time    Color DARK YELLOW 05/09/2009 11:05 AM    Appearance CLEAR 05/09/2009 11:05 AM    Specific gravity 1.010 10/29/2008 11:00 AM    Specific gravity 1.024 05/09/2009 11:05 AM    pH 6.0 05/09/2009 11:05 AM    Protein TRACE 05/09/2009 11:05 AM    Glucose NEGATIVE  05/09/2009 11:05 AM    Ketone NEGATIVE  05/09/2009 11:05 AM    Bilirubin NEGATIVE  04/30/2009 12:14 AM    Urobilinogen 0.2 05/09/2009 11:05 AM    Nitrites NEGATIVE  05/09/2009 11:05 AM    Leukocyte Esterase SMALL 05/09/2009 11:05 AM    Epithelial cells 0-5 05/09/2009 11:05 AM    Bacteria NEGATIVE  05/09/2009 11:05 AM    WBC 5-10 05/09/2009 11:05 AM    RBC 10-20 05/09/2009 11:05 AM     MEDICATIONS:  Current facility-administered medications   Medication Dose Route Frequency   ? magnesium  sulfate 2 g/50 ml IVPB   2 g IntraVENous ONCE   ? potassium chloride  SR (KLOR-CON  10) tablet 20 mEq  20 mEq Oral DAILY   ? metoprolol  (LOPRESSOR ) tablet 12.5 mg  12.5 mg Oral Q12H   ? albuterol /ipratropium (DUONEB ) neb solution   1 Dose Nebulization Q4H PRN   ? lorazepam (ATIVAN) injection 1 mg  1 mg IntraVENous Q4H PRN   ? pantoprazole  (PROTONIX ) tablet 40 mg  40 mg Oral ACB   ? rifaximin (XIFAXAN) tablet 400 mg  400 mg Oral TID   ? aspirin chewable tablet 81 mg  81 mg Oral DAILY   ? albumin human 5% (BUMINATE) solution 25 g  25 g IntraVENous Q4H PRN   ? sodium chloride  (NS) flush 10 mL  10 mL InterCATHeter Q8H   ? sodium chloride  (NS) flush 10 mL 10 mL InterCATHeter PRN   ? heparin (porcine) pf 300 Units  300 Units InterCATHeter PRN   ? sodium chloride  (NS) flush 20 mL  20 mL InterCATHeter PRN   ?  acetaminophen  (TYLENOL ) suppository 650 mg  650 mg Rectal Q6H PRN   ? saline peripheral flush 5 mL  5 mL InterCATHeter PRN       Electronically signed by Chandra Byers, MD at 05/24/2009  9:30 PM EDT

## 2009-05-24 NOTE — Progress Notes (Signed)
 Formatting of this note is different from the original.  Problem: Mobility Impaired (Adult)  Goal: *Acute Goals and Plan of Care (Insert Text)  Physical Therapy Goals  Revised 05/20/2009  1. Patient will move from supine to sit and sit to supine , scoot up and down and roll side to side in bed with modified independence within 7 day(s).   2. Patient will transfer from bed to chair and chair to bed with minimal assistance/CGA using the least restrictive device within 7 day(s).  3. Patient will perform sit to stand with minimal assistance/CGA within 7 day(s).  4. Patient will be able to ambulate with minimal assist for 20 feet within 7 days.   5. Patient will perform 1 sets of 15 repetitions of active strengthening exercises for bilateral upper and lower extremity(s) with supervision/set-up within 7 day(s). REMAINS    Initiated 05/12/2009  1. Patient will move from supine to sit and sit to supine , scoot up and down and roll side to side in bed with moderate assistance within 7 day(s).   2. Patient will transfer from bed to chair and chair to bed with moderate assistance using the least restrictive device within 7 day(s).  3. Patient will perform sit to stand with moderate assistance within 7 day(s).  4. Patient will be able to sit at EOB x 5 minutes while maintaining midline with minimum assist within 7 days.   5. Patient will perform 1 sets of 15 repetitions of active strengthening exercises for bilateral upper and lower extremity(s) with supervision/set-up within 7 day(s).     PHYSICAL THERAPY TREATMENT    NAME: Breanna Lester AGE: 48 y.o.  GENDER: female  DATE: 05/24/2009  PRIMARY DIAGNOSIS: AMS, hypokalemia, lft upper extremity pain  AMS, hypokalemia, lft upper extremity pain  AMS, hypokalemia, lft upper extremity pain  Colonoscopy  <principal problem not specified>  Procedure(s) (LRB):  ESOPHAGOGASTRODUODENOSCOPY (EGD) (N/A)  ESOPHAGOGASTRODUODENAL (EGD) BIOPSY (N/A)  7 Days Post-Op    Precautions: Fall  Chart ,  physical therapy assessment, plan of care and goals  w ere  reviewed.    SUBJECTIVE:   Patient stated ? I haven't been up that much this weekend .?    OBJECTIVE DATA SUMMARY:   Critical Behavior:  Level of Consciousness: Alert;Confused  Orientation Level: Disoriented to situation;Disoriented to time;Oriented to person;Oriented to place  Cognition: Decreased attention/concentration;Follows commands;Impaired decision making;Impulsive  Safety/Judgement: Decreased awareness of need for assistance;Decreased awareness of need for safety  Functional Mobility Training:  Bed Mobility:  Rolling: Supervision  Supine to Sit:  (SBA)  Scooting: Additional time , Supervision    Transfers:  Sit to Stand: Assist X2;Minimum assistance  Balance:  Sitting: Impaired  Sitting - Static: Good (unsupported)  Sitting - Dynamic: Not tested  Standing: Impaired  Standing - Static: Constant support;Poor  Standing - Dynamic : Impaired (Poor)  Ambulation /Gait Training :  Distance (ft): 15 Feet (ft)  Assistive Device:  (Holding arm of therapist on right)    Level of Assistance: Moderate assistance x 1 and Min assist x 1  Neuro Re-Education:  Sitting EOB for trunk control: pt able to hold trunk upright with cues and with RUE support on bed,  Several slight LOB but pt able to recover without assist from therapist  Therapeutic Exercises: (In Sitting)Pt instructed to perform 2 more times today  Long Arc Quads 1 x 10   Hip Flexion 1 x 10    Pain:  No c/o pain    Activity Tolerance:  Pt c/o feeling woozy on sitting but BP remained WFL, symptoms subsided after several minutes  Vitals Assessment 1:  Patient Position: Sitting  BP: 115/53 mmHg  Pulse (Heart Rate): 59   O2 Sat (%): 97 %  O2 Device: Room air          After treatment:  [X]     Patient left in no apparent distress  [X]     Call bell left within reach  [ ]     Caregiver present  [X]     Patient left up in chair  with bed alarm on , nursing staff notified    ASSESSMENT AND PROGRESSION TOWARD GOALS:    Pt willing and motivated to participate in therapy and had minimal confusion.  Pt able to ambulate but still has difficulty with balance.  She was also able to perform exercises through full ROM today. Pt has deficits in endurance, strength and functional mobility that will benefit from inpatient rehab.    Patient?s progression toward goals is as follows:  [ ]     Improving appropriately and progressing toward goals  [X]     Improving slowly and progressing toward goals  [ ]     Not making progress toward goals and plan of care will be adjusted    PLAN OF CARE:   Patient continues to benefit from skilled intervention to address the above impairments.  Continue treatment per established plan of care.  Planned Interventions:  [X]     Bed Mobility Training              [ ]     Neuromuscular Re-education  [X]     Transfer Training                      [ ]     Orthotic/Prosthetic Training  [X]     Gait Training                             [ ]     Modalities  [X]    Therapeutic Exercises             [ ]     Edema Management/Control  [X]     Therapeutic Activities              [X]     Patient and Family Training/Education  [ ]     Other (comment):  Discharge Recommendations:  [ ]     To be determined                      [ ]     Day rehabilitation program  [ ]     Home without services              [ ]     Outpatient therapy  [ ]     Home with Home Health services        [ ]     Nursing facility for rehab  [X]     Inpatient Rehab Hospital         [ ]     Nursing facility for long term care  [ ]     Other:  Further Equipment Recommendations for Discharge: TBD  Communication/Collaboration:  The patient?s plan of care was discussed with:  Certified Occupational Therapy Assistant and Registered Nurse, Administrator A. Mason  Time Calculation: 26 mins      Electronically signed by Jonette Shadow A at 05/24/2009 11:30 AM EDT

## 2009-05-24 NOTE — Progress Notes (Signed)
 Formatting of this note is different from the original.  Problem: Neurolinguistics Impaired (Adult)  Goal: *Acute Goals and Plan of Care (Insert Text)  Speech Therapy Goals  Re-evaluated 05/24/09 New goals as follows:  1. Patient will demonstrate OX4 with no cues within 7 days.  2. Patient will recall 2 facts after 5 minutes with no cues within 7 days.  3. Patient will provide 6 items in mod-complex categories with min. cues within 7 days.  4. Patient will provide 2-3 causes/effects to mod-complex problems with min cues within 7 days.    Initiated 05/19/09; Re-evaluated 05/24/09  1. Patient will be Ox4 with no cues within 7 days. NOT MET  2. Patient will complete simple attention tasks with no more than redirection x1 within 7 days. MET  3. Patient will provide 2-3 solutions to common ADL problems with min cues within 7 days. MET  4. Patient will name 3 category members for moderate-complex categorization task with min cues within 7 days. MET   SPEECH LANGUAGE PATHOLOGY TREATMENT: WEEKLY REASSESSMENT    NAME : Breanna Lester AGE: 48 y.o.  GENDER: female  DATE: 05/24/2009  PRIMARY DIAGNOSIS: AMS, hypokalemia, lft upper extremity pain  AMS, hypokalemia, lft upper extremity pain  AMS, hypokalemia, lft upper extremity pain  Colonoscopy  <principal problem not specified>  Procedure(s) (LRB):  ESOPHAGOGASTRODUODENOSCOPY (EGD) (N/A)  ESOPHAGOGASTRODUODENAL (EGD) BIOPSY (N/A)  7 Days Post-Op     SUBJECTIVE:  Patient's nurse stated that patient is hallucinating that her daughter is present with her in her room. Patient motivated to work in therapy.    OBJECTIVE:  Mental Status:  Level of Consciousness: Alert;Confused ((although easily reoriented))  Orientation Level: Disoriented to situation;Disoriented to time;Oriented to person;Oriented to place  Cognition: Follows commands  Perception: Appears intact  Perseveration: No perseveration noted  Safety/Judgement: Fall prevention    Neuro-Linguistics:    Orientation: Patient was  initially oriented to person, year and situation. Re-oriented patient. After 5 minutes she was oriented to person, month, year and situation. Re-oriented patient.    Verbal Problem Solving: Verbal solutions;Functional. Patient was able to provide 2-3 solutions to functional ADL problems with no cues.   Verbal Organization: Divergent naming. Patient was able to provide 3 items in mod-complex categories with no cues.  Recall: Patient was able to recall 1/3 facts after 5 minutes with no cues (re-orientation)    Response & Tolerance to Activities:  Patient was participatory and motivated.    Pain:  None   0  After treatment:  [X]       Patient left in NAD  [X]       Call bell left within reach  [ ]       Caregiver present  [ ]       Patient left up in chair, nursing staff notified    ASSESSMENT AND PROGRESSION TOWARD GOALS:   Patient participated well and was motivated in session. Her nurse reported that she is often lucid, however at times is hallucinating and disoriented. Patient met 3/4 short term neurolinguistic goals this week. 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    Patient?s progression toward goals since last assessment: improving towards goals    PLAN OF CARE:   Goals have been updated based on progression since last assessment.    Patient continues to benefit from skilled intervention to address the above impairments.    Continue to follow patient 3  times/week  to address goals.  Recommendations and Planned Interventions: Continue neurolinguistic therapy    Communication/Collaboration:  The patient?s plan of care was discussed with:  Registered Nurse    Devere FORBES Litten, SLP    Time Calculation: 25 mins          Electronically signed by Litten Devere FORBES, SLP at 05/24/2009 12:36 PM EDT

## 2009-05-24 NOTE — Progress Notes (Signed)
 Formatting of this note is different from the original.  Problem: Self Care Deficits Care Plan (Adult)  Goal: *Acute Goals and Plan of Care (Insert Text)  Occupational Therapy Goals reviewed and revised ( as needed) as per 9/29 reevaluation.  1. Pt will complete grooming in supported position with minimal assistance within 7 days.  2. Pt will increase fine motor skills to open task containers (toothpaste, lotion, ect) with set up assist within 7 days.  3. Pt. Will perform SPT to/from [facility] with min assist within 7 days. (established 9/29)  4. Pt will tolerated x 10 min edge of bed with minimal assistance while completing dynamic balance activity within 7 days.  5. Pt will move from supine to sit with minimal assistance within 7 days, in preparation for functional mobility.   OCCUPATIONAL THERAPY TREATMENT    NAME: Breanna Lester AGE: 48 y.o.  GENDER: female  DATE: 05/24/2009  PRIMARY DIAGNOSIS: AMS, hypokalemia, lft upper extremity pain  AMS, hypokalemia, lft upper extremity pain  AMS, hypokalemia, lft upper extremity pain  Colonoscopy  <principal problem not specified>  Procedure(s) (LRB):  ESOPHAGOGASTRODUODENOSCOPY (EGD) (N/A)  ESOPHAGOGASTRODUODENAL (EGD) BIOPSY (N/A)  7 Days Post-Op     Precautions: fall    Chart, occupational therapy assessment, plan of care, and goals were reviewed.    SUBJECTIVE:   Patient stated ? It's scary. I haven't walked that much in 6 months .?    OBJECTIVE DATA SUMMARY:   Cognitive/Behavioral Status:  Level of Consciousness: Alert;Confused ((although easily reoriented))  Orientation Level: Disoriented to situation;Disoriented to time;Oriented to person;Oriented to place  Cognition: Follows commands  Safety/Judgement: Fall Dietitian and Transfers for ADLs:              Bed Mobility:  Rolling: Supervision  Supine to Sit:  (SBA)  Scooting: Additional time              Transfers:  Assisted PTA with transfers and ambulation. See PT note.              Sit to Stand: Assist  X2;Minimum assistance              Bed to Chair: Minimum assistance;Assist X2;Moderate assistance (taking steps to chair) (HHA x2)                          Balance:  Sitting: Impaired  Sitting - Static: Good (unsupported)  Sitting - Dynamic: Not tested  Standing: Impaired  Standing - Static: Constant support;Poor  Standing - Dynamic : Impaired   ADL Intervention:  Feeding  Utensil Management: Compensatory technique training (provided patient with built up foam handle for decreased gri p. Educated patient with use, patient demonstrates understanding. )    Adaptive Equipment: Built up fork;Non-skid surface    Grooming  Grooming Assistance: Moderate assistance  Brushing Teeth: Min assist.  (following set up seated in chair. Difficulty finding items s curt to visual deficits. )  Demonstrated difficulty managing top on toothpaste secondary to decreased coordination and decreased hand strength. Required assist to apply toothpaste to toothbrush secondary to visual deficits.  Cues: Verbal cues provided;Tactile cues provided;Physical assistance;Visual cues provided    Cognitive Retraining  Orientation Retraining: Reorienting  Problem Solving: Awareness of environment;Identifying the problem;General alternative solution  Organizing/Sequencing: Breaking task down  Attention to Task: Distractibility  Following Commands: Follows two step commands/directions  Safety/Judgement: Fall prevention  Cues: Tactile cues provided;Verbal cues provided;Visual cues provided  Pain:  Pain Scale: Visual  Pain Intensity: 0  Pain Location: Back (says she has kidney pain)    Activity Tolerance: Patient initially c/o dizziness, however, this resolved upon standing.  Vitals Assessment 1:  Patient Position: Sitting  BP: 115/53 mmHg  Pulse (Heart Rate): 59       After treatment:  [X]    Patient left in NAD  [X]    Call bell left within reach  [ ]    Caregiver present  [X]    Patient left up in chair, nursing staff notified    ASSESSMENT AND  PROGRESSION TOWARD GOALS:   Patient pleasant and agreeable to therapy. Minimal confusion today, easily reoriented. Patient very motivated to improve and become more independent with functional mobility and ADLs. Presents with visual deficits, general weakness, decreased balance, activity tolerance, and safety awareness. Patient will continue to benefit from OT to address deficits. Would benefit from inpatient rehab following discharge.    Patient?s progression toward goals is as follows:  [ ]    Improving appropriately and progressing toward goals  [X]    Improving slowly and progressing toward goals  [ ]    Not making progress toward goals and plan of care will be adjusted    PLAN OF CARE:   Patient continues to benefit from skilled intervention to address the above impairments.  Continue treatment per established plan of care.  Planned Interventions:  [X]    Self Care Training                    [X]     Therapeutic Activities  [X]    Functional Mobility Training     [X]     Cognitive Retraining  [X]    Therapeutic Exercises              [X]     Endurance Activities  [X]    Balance Training                       [ ]     Neuromuscular Re-education  [X]    Patient Education                      [ ]     Family Training/Education  [ ]    Visual/Perceptual Training         [ ]     Home safety training  [ ]    Other (Comment):  Discharge Recommendations:  [ ]    To be determined                                   [ ]     Day rehabilitation program  [ ]    Home without services  (home safety education provided)  [ ]    Home with Home Health services  (home safety education provided)  [ ]   Outpatient therapy                                    [ ]     Nursing facility for rehab  [X]    Inpatient [facility]                      [ ]     Nursing facility for long term care  [ ]    Other:  Further Equipment Recommendations for Discharge: tbd  Communication/Collaboration:  The patient?s plan  of care was discussed with: Physical Therapy  Assistant    Vernell BRAVO. [place], COTA /L  Time Calculation: 28 mins          Electronically signed by Amado Elouise SQUIBB, OTR/L at 05/24/2009  5:48 PM EDT

## 2009-05-24 NOTE — Progress Notes (Signed)
 Formatting of this note might be different from the original.  Spoke with patient and RN regarding current diet tolerance and ability to upgrade. Patient feels she is tolerating diet well. RN reported that she is tolerating diet well and feels an upgrade would be appropriate. Will upgrade diet to regular with thin. Continue swallow precautions.  Electronically signed by Scheryl Devere BRAVO, SLP at 05/24/2009 12:40 PM EDT

## 2009-05-24 NOTE — Progress Notes (Signed)
 Formatting of this note might be different from the original.  GI note    No bleeding  Eating well  No new recommendations    Levander PHEBE Angle, MD  7:10 PM  05/24/2009    Electronically signed by Angle Levander FALCON, MD at 05/24/2009  7:10 PM EDT

## 2009-05-24 NOTE — Progress Notes (Signed)
Problem: Self Care Deficits Care Plan (Adult)  Goal: *Acute Goals and Plan of Care (Insert Text)  Occupational Therapy Goals reviewed and revised ( as needed) as per 9/29 reevaluation.  1. Pt will complete grooming in supported position with minimal assistance within 7 days.  2. Pt will increase fine motor skills to open task containers (toothpaste, lotion, ect) with set up assist within 7 days.  3. Pt. Will perform SPT to/from [facility] with min assist within 7 days. (established 9/29)  4. Pt will tolerated x 10 min edge of bed with minimal assistance while completing dynamic balance activity within 7 days.  5. Pt will move from supine to sit with minimal assistance within 7 days, in preparation for functional mobility.   OCCUPATIONAL THERAPY TREATMENT    NAME: Breanna Lester AGE: 48 y.o.  GENDER: female  DATE: 05/24/2009  PRIMARY DIAGNOSIS: AMS, hypokalemia, lft upper extremity pain  AMS, hypokalemia, lft upper extremity pain  AMS, hypokalemia, lft upper extremity pain  Colonoscopy  <principal problem not specified>  Procedure(s) (LRB):  ESOPHAGOGASTRODUODENOSCOPY (EGD) (N/A)  ESOPHAGOGASTRODUODENAL (EGD) BIOPSY (N/A)  7 Days Post-Op     Precautions: fall    Chart, occupational therapy assessment, plan of care, and goals were reviewed.      SUBJECTIVE:   Patient stated ??? It's scary. I haven't walked that much in 6 months .???     OBJECTIVE DATA SUMMARY:   Cognitive/Behavioral Status:  Level of Consciousness: Alert;Confused ((although easily reoriented))  Orientation Level: Disoriented to situation;Disoriented to time;Oriented to person;Oriented to place  Cognition: Follows commands  Safety/Judgement: Fall Dietitian and Transfers for ADLs:              Bed Mobility:  Rolling: Supervision  Supine to Sit:  (SBA)  Scooting: Additional time              Transfers:  Assisted PTA with transfers and ambulation. See PT note.              Sit to Stand: Assist X2;Minimum assistance               Bed to Chair: Minimum assistance;Assist X2;Moderate assistance (taking steps to chair) (HHA x2)                          Balance:  Sitting: Impaired  Sitting - Static: Good (unsupported)  Sitting - Dynamic: Not tested  Standing: Impaired  Standing - Static: Constant support;Poor  Standing - Dynamic : Impaired   ADL Intervention:  Feeding  Utensil Management: Compensatory technique training (provided patient with built up foam handle for decreased gri p. Educated patient with use, patient demonstrates understanding. )    Adaptive Equipment: Built up fork;Non-skid surface    Grooming  Grooming Assistance: Moderate assistance  Brushing Teeth: Min assist.  (following set up seated in chair. Difficulty finding items s Laurena Spies to visual deficits. )  Demonstrated difficulty managing top on toothpaste secondary to decreased coordination and decreased hand strength. Required assist to apply toothpaste to toothbrush secondary to visual deficits.  Cues: Verbal cues provided;Tactile cues provided;Physical assistance;Visual cues provided    Cognitive Retraining  Orientation Retraining: Reorienting  Problem Solving: Awareness of environment;Identifying the problem;General alternative solution  Organizing/Sequencing: Breaking task down  Attention to Task: Distractibility  Following Commands: Follows two step commands/directions  Safety/Judgement: Fall prevention  Cues: Tactile cues provided;Verbal cues provided;Visual cues provided     Pain:  Pain Scale: Visual  Pain  Intensity: 0  Pain Location: Back (says she has kidney pain)    Activity Tolerance: Patient initially c/o dizziness, however, this resolved upon standing.  Vitals Assessment 1:  Patient Position: Sitting  BP: 115/53 mmHg  Pulse (Heart Rate): 59         After treatment:  [X]    Patient left in NAD  [X]    Call bell left within reach  [ ]    Caregiver present  [X]    Patient left up in chair, nursing staff notified       ASSESSMENT AND PROGRESSION TOWARD GOALS:   Patient pleasant and agreeable to therapy. Minimal confusion today, easily reoriented. Patient very motivated to improve and become more independent with functional mobility and ADLs. Presents with visual deficits, general weakness, decreased balance, activity tolerance, and safety awareness. Patient will continue to benefit from OT to address deficits. Would benefit from inpatient rehab following discharge.    Patient???s progression toward goals is as follows:  [ ]    Improving appropriately and progressing toward goals  [X]    Improving slowly and progressing toward goals  [ ]    Not making progress toward goals and plan of care will be adjusted      PLAN OF CARE:   Patient continues to benefit from skilled intervention to address the above impairments.  Continue treatment per established plan of care.  Planned Interventions:  [X]    Self Care Training                    [X]     Therapeutic Activities  [X]    Functional Mobility Training     [X]     Cognitive Retraining  [X]    Therapeutic Exercises              [X]     Endurance Activities  [X]    Balance Training                       [ ]     Neuromuscular Re-education  [X]    Patient Education                      [ ]     Family Training/Education  [ ]    Visual/Perceptual Training         [ ]     Home safety training  [ ]    Other (Comment):  Discharge Recommendations:  [ ]    To be determined                                   [ ]     Day rehabilitation program  [ ]    Home without services  (home safety education provided)  [ ]    Home with Home Health services  (home safety education provided)  [ ]   Outpatient therapy                                    [ ]     Nursing facility for rehab  [X]    [facility]patient [facility]                      [ ]     Nursing facility for long term care  [ ]    Other:  Further Equipment Recommendations for Discharge: tbd  Communication/Collaboration:   The patient???s plan  of care was discussed with: Physical Therapy Assistant    Vinnie Langton. Sherrie George, COTA /L  Time Calculation: 28 mins

## 2009-05-24 NOTE — Progress Notes (Signed)
Problem: Mobility Impaired (Adult)  Goal: *Acute Goals and Plan of Care (Insert Text)  Physical Therapy Goals  Revised 05/20/2009  1. Patient will move from supine to sit and sit to supine , scoot up and down and roll side to side in bed with modified independence within 7 day(s).   2. Patient will transfer from bed to chair and chair to bed with minimal assistance/CGA using the least restrictive device within 7 day(s).  3. Patient will perform sit to stand with minimal assistance/CGA within 7 day(s).  4. Patient will be able to ambulate with minimal assist for 20 feet within 7 days.   5. Patient will perform 1 sets of 15 repetitions of active strengthening exercises for bilateral upper and lower extremity(s) with supervision/set-up within 7 day(s). REMAINS    Initiated 05/12/2009  1. Patient will move from supine to sit and sit to supine , scoot up and down and roll side to side in bed with moderate assistance within 7 day(s).   2. Patient will transfer from bed to chair and chair to bed with moderate assistance using the least restrictive device within 7 day(s).  3. Patient will perform sit to stand with moderate assistance within 7 day(s).  4. Patient will be able to sit at EOB x 5 minutes while maintaining midline with minimum assist within 7 days.   5. Patient will perform 1 sets of 15 repetitions of active strengthening exercises for bilateral upper and lower extremity(s) with supervision/set-up within 7 day(s).     PHYSICAL THERAPY TREATMENT    NAME: Breanna Lester AGE: 48 y.o.  GENDER: female  DATE: 05/24/2009  PRIMARY DIAGNOSIS: AMS, hypokalemia, lft upper extremity pain  AMS, hypokalemia, lft upper extremity pain  AMS, hypokalemia, lft upper extremity pain  Colonoscopy  <principal problem not specified>  Procedure(s) (LRB):  ESOPHAGOGASTRODUODENOSCOPY (EGD) (N/A)  ESOPHAGOGASTRODUODENAL (EGD) BIOPSY (N/A)  7 Days Post-Op    Precautions: Fall   Chart , physical therapy assessment, plan of care and goals  w ere  reviewed.     SUBJECTIVE:   Patient stated ??? I Breanna Lester't been up that much this weekend .???     OBJECTIVE DATA SUMMARY:   Critical Behavior:  Level of Consciousness: Alert;Confused  Orientation Level: Disoriented to situation;Disoriented to time;Oriented to person;Oriented to place  Cognition: Decreased attention/concentration;Follows commands;Impaired decision making;Impulsive  Safety/Judgement: Decreased awareness of need for assistance;Decreased awareness of need for safety  Functional Mobility Training:  Bed Mobility:  Rolling: Supervision  Supine to Sit:  (SBA)  Scooting: Additional time , Supervision    Transfers:  Sit to Stand: Assist X2;Minimum assistance  Balance:  Sitting: Impaired  Sitting - Static: Good (unsupported)  Sitting - Dynamic: Not tested  Standing: Impaired  Standing - Static: Constant support;Poor  Standing - Dynamic : Impaired (Poor)  Ambulation /Gait Training :  Distance (ft): 15 Feet (ft)  Assistive Device:  (Holding arm of therapist on right)    Level of Assistance: Moderate assistance x 1 and Min assist x 1  Neuro Re-Education:  Sitting EOB for trunk control: pt able to hold trunk upright with cues and with RUE support on bed,  Several slight LOB but pt able to recover without assist from therapist  Therapeutic Exercises: (In Sitting)Pt instructed to perform 2 more times today  Long Arc Quads 1 x 10   Hip Flexion 1 x 10    Pain:  No c/o pain    Activity Tolerance: Pt c/o feeling woozy on sitting but BP  remained WFL, symptoms subsided after several minutes  Vitals Assessment 1:  Patient Position: Sitting  BP: 115/53 mmHg  Pulse (Heart Rate): 59   O2 Sat (%): 97 %  O2 Device: Room air                  After treatment:  [X]     Patient left in no apparent distress  [X]     Call bell left within reach  [ ]     Caregiver present  [X]     Patient left up in chair  with bed alarm on , nursing staff notified       ASSESSMENT AND PROGRESSION TOWARD GOALS:   Pt willing and motivated to participate in therapy and had minimal confusion.  Pt able to ambulate but still has difficulty with balance.  She was also able to perform exercises through full ROM today. Pt has deficits in endurance, strength and functional mobility that will benefit from inpatient rehab.    Patient???s progression toward goals is as follows:  [ ]     Improving appropriately and progressing toward goals  [X]     Improving slowly and progressing toward goals  [ ]     Not making progress toward goals and plan of care will be adjusted      PLAN OF CARE:   Patient continues to benefit from skilled intervention to address the above impairments.  Continue treatment per established plan of care.  Planned Interventions:  [X]     Bed Mobility Training              [ ]     Neuromuscular Re-education  [X]     Transfer Training                      [ ]     Orthotic/Prosthetic Training  [X]     Gait Training                             [ ]     Modalities  [X]    Therapeutic Exercises             [ ]     Edema Management/Control  [X]     Therapeutic Activities              [X]     Patient and Family Training/Education  [ ]     Other (comment):  Discharge Recommendations:  [ ]     To be determined                      [ ]     Day rehabilitation program  [ ]     Home without services              [ ]     Outpatient therapy  [ ]     Home with Home Health services        [ ]     Nursing facility for rehab  [X]     Inpatient [facility]         [ ]     Nursing facility for long term care  [ ]     Other:  Further Equipment Recommendations for Discharge: TBD  Communication/Collaboration:  The patient???s plan of care was discussed with:  Certified Occupational Therapy Assistant and Registered Nurse, Social Worker    Rhylin Venters A. Brecklynn Jian  Time Calculation: 26 mins

## 2009-05-24 NOTE — Progress Notes (Signed)
GI note    No bleeding  Eating well  No new recommendations    [name], MD  7:10 PM  05/24/2009

## 2009-05-24 NOTE — Progress Notes (Signed)
Hospitalist Progress Note         NAME: Breanna Lester        DOB:  1961/08/21        MRN:  [id number]      Assessment & Plan   Hepatic encephalopathy/cirrhosis on rifaximin, GI input appreciated. Hx cirrhosis.Taken off lactulose because of diarrhea,  Improved.  Will eventually need to resume her diuretics.  bp remains on low side.  Her mentation remains much improved.    Deconditioning/unstable gait: cont pt/ot and anticipate d/c soon to [facility] .    --hypokalemia/hypomagnesemia replaced, follow    Sepsis syndrome :underlying pneumonia, fever, leukocytosis, resolved.   off vanco and LVQ.   Off Zosyn  No evidence of UTI or ascites(SBP), has an umbilical hernia that is not     reducible but overall adbomen is soft,   No diarrhea/ off lactulose      Anemia, s/p prbc, s/p egd with no source of bleed and path benign: egd revealed Schatzki's ring/ HH.  Continue ppi.  Will transfuse if hgb <7.9.    Altered mental status : multifactorial Sepsis, hypernatremia, ARF, hyperamonemia, Hepatic encephalopathy resolving. Much improved, hallucinations not evidenced.    NSTEMI:on ASA and beta-blockers, Received  Lovenox earlier,   Cardiology followed, ischemic workup  as outpatient.    Hypothyroidism(?) tsh suppressed < 0.01, discontinued levothyroxine, recheck tsh remains suppressed but Ft4 upper limit nl, stopped levothyroxine 9/28.  Repeat tsh in 4-6 weeks.    Gait abnormality / generalized debility  B12, folate normal, PT/OT input appreciated.  Will ask [facility] opinion on appropriateness for inpatient rehab. Can be d/c when arrangements made.    Nutrition:   tpn discontinued.  Tolerating diet well.    Left wrist Fracture: casted Ortho following, will need to follow outpatient.Repeat xray today "little change"    Acute respiratory failure:resolved, got extubated doing well off the ventilator.  cxr improved appearance.    ARF: resolved    Rhabdomyolisis: resolved    Hypernatremia: resolved, now off IVF.    Dispo: snf vs [facility]. prob Monday.          Subjective:     ADMIT Breanna:Lester is a 48 y.o.?? Caucasian female whom presents with complaint of left forearm pain, nausea and vomiting.  Patient with a hx of cirrohsis presents ambulatory to ED with cc of vomiting. Pt's relative reports nausea and vomiting "all day".?? Per family member, when pt experiences N/V secondary to cirrhosis, it usually is associated with a fever. Pt's relative reports that pt fractured her ulna and radius 2 days ago after falling and that fractures were never "set", just "wrapped". Pt's relative also notes that pt's vision has been worsening, indicating this as the reason she fell 2 days ago   We were asked to admit for work up and evaluation of the above problems.   ????    Chief Complaint: Remains more lucid, deny's hallucinations. No changes.  Review of Systems:  Fever/chills N   Cough y   Sputum    y   SOB/DOE  N   Chest Pain N   Abdominal Pain N   Diarrhea N   Constipation N   Nausea/Vomiting N   Dysuria N   Tolerating PT y   Tolerating Diet y   Could not obtain due to AMS vs intubation         Objective:       VITALS:   Last 24hrs VS reviewed since prior progress note. Most recent are:  Visit Vitals   Item Reading   ??? BP 112/55   ??? Pulse 62   ??? Temp 98 ??F (36.7 ??C)   ??? Resp 18   ??? Ht 5\' 2"  (1.575 m)   ??? Wt 144 lb 13.5 oz (65.7 kg)   ??? SpO2 95%   ??? PF 40L/min           Intake/Output Summary (Last 24 hours) at 05/24/09 2127  Last data filed at 05/23/09 2246   Gross per 24 hour   Intake      0 ml   Output      1 ml   Net     -1 ml        Telemetry Reviewed:     PHYSICAL EXAM:  General:  awake , nad, cooperative   HEENT: Pupils unequal, R 3mm, L 2mm, eomi  Lungs:             Essentially clear and non-labored.  Heart:  Regular rhythm, tachycardic, SEM  Abdomen: Soft, Non distended, hypoactive BS,RUQ pain,  no rebound, umbilical hernia non reducible & NT.  Extremities: No edema, pulses present, brace in left arm  Neurologic:?? aa0x 3, no gross deficit  Psych:???? Alert and awake today     Lab Data Reviewed: (see below)    Medications Reviewed: (see below)    PMH/SH reviewed - no change compared to H&P    EGD BX 05/17/09:  1. Duodenum, biopsy:  Normal small intestinal mucosa.  2. Stomach, biopsy:  Benign fundic and antral mucosa with mild reactive gastropathy.  Giemsa stain for Helicobacter pylori is negative.  3. Mid/distal esophagus, biopsy:  Benign squamous mucosa with features suggestive of reflux  esophagitis.  No evidence of eosinophilic esophagitis or Barrett's.  ______________________________________________________________________        Care Plan discussed with:  Patient x   Family    RN x   Care Manager    Consultant/Specialist      >50% of visit spent in counseling and coordination of care y      Prophylaxis:  GI PPI   DVT SCD     Disposition:   Home with Family    HH/PT/OT/RN    SNF/LTC x   [facility]      ______________________________________________________________________  Attending Physician: Agape Hardiman A. Bradly Bienenstock, MD   _____________________________________________________________________________________________________  Procedures: see electronic medical records for all procedures/Xrays and details  which were not copied into this note but were reviewed prior to creation of Plan.      LABS:  Recent Labs   Basename 05/22/09 0245   ??? WBC 4.9   ??? HGB 8.0*   ??? HCT 25.7*   ??? PLT 81*         Recent Labs   Basename 05/24/09 0215 05/22/09 0245   ??? NA 138 138   ??? K 3.3* 3.5   ??? CL 106 108   ??? CO2 25 25   ??? BUN 4* 6   ??? CREA 0.7 0.7   ??? GLU 98 94   ??? CA 9.0 8.7   ??? MG 1.4* 1.5*   ??? PHOS -- --   ??? URICA -- --           No results found for this basename: SGOT:3,GPT:3,AP:3,TBIL:3,TP:3,ALB:3,GLOB:3,GGT:3,AML:3,AMYP:3,LPSE:3,HLPSE:3 in the last 72 hours      No results found for this basename: INR:3,PTP:3,APTT:3, in the last 72 hours     No results found for this basename: FE:2,TIBC:2,PSAT:2,FERR:2, in the last 72 hours  No results found for this basename: PH:2,PCO2:2,PO2:2, in the last 72 hours       No results found for this basename: CPK:3,CKMB:3,TROPONINI:3, in the last 72 hours    Lab Results   Component Value Date/Time    POC GLUCOSE 165 05/24/2009  7:53 PM    POC GLUCOSE 103 05/24/2009  4:06 PM    POC GLUCOSE 107 05/24/2009 11:13 AM    POC GLUCOSE 92 05/24/2009  7:40 AM    POC GLUCOSE 110 05/23/2009  8:23 PM         Lab Results   Component Value Date/Time    Color DARK YELLOW 05/09/2009 11:05 AM    Appearance CLEAR 05/09/2009 11:05 AM    Specific gravity 1.010 10/29/2008 11:00 AM    Specific gravity 1.024 05/09/2009 11:05 AM    pH 6.0 05/09/2009 11:05 AM    Protein TRACE 05/09/2009 11:05 AM    Glucose NEGATIVE  05/09/2009 11:05 AM    Ketone NEGATIVE  05/09/2009 11:05 AM    Bilirubin NEGATIVE  04/30/2009 12:14 AM    Urobilinogen 0.2 05/09/2009 11:05 AM    Nitrites NEGATIVE  05/09/2009 11:05 AM    Leukocyte Esterase SMALL 05/09/2009 11:05 AM    Epithelial cells 0-5 05/09/2009 11:05 AM    Bacteria NEGATIVE  05/09/2009 11:05 AM    WBC 5-10 05/09/2009 11:05 AM    RBC 10-20 05/09/2009 11:05 AM           MEDICATIONS:  Current facility-administered medications   Medication Dose Route Frequency   ??? magnesium sulfate 2 g/50 ml IVPB   2 g IntraVENous ONCE   ??? potassium chloride SR (KLOR-CON 10) tablet 20 mEq  20 mEq Oral DAILY   ??? metoprolol (LOPRESSOR) tablet 12.5 mg  12.5 mg Oral Q12H   ??? albuterol/ipratropium (DUONEB) neb solution   1 Dose Nebulization Q4H PRN   ??? lorazepam (ATIVAN) injection 1 mg  1 mg IntraVENous Q4H PRN   ??? pantoprazole (PROTONIX) tablet 40 mg  40 mg Oral ACB   ??? rifaximin (XIFAXAN) tablet 400 mg  400 mg Oral TID   ??? aspirin chewable tablet 81 mg  81 mg Oral DAILY   ??? albumin human 5% (BUMINATE) solution 25 g  25 g IntraVENous Q4H PRN   ??? sodium chloride (NS) flush 10 mL  10 mL InterCATHeter Q8H   ??? sodium chloride (NS) flush 10 mL 10 mL InterCATHeter PRN   ??? heparin (porcine) pf 300 Units  300 Units InterCATHeter PRN   ??? sodium chloride (NS) flush 20 mL  20 mL InterCATHeter PRN    ??? acetaminophen (TYLENOL) suppository 650 mg  650 mg Rectal Q6H PRN   ??? saline peripheral flush 5 mL  5 mL InterCATHeter PRN

## 2009-05-24 NOTE — Progress Notes (Signed)
Spoke with patient and RN regarding current diet tolerance and ability to upgrade. Patient feels she is tolerating diet well. RN reported that she is tolerating diet well and feels an upgrade would be appropriate. Will upgrade diet to regular with thin. Continue swallow precautions.

## 2009-05-24 NOTE — Progress Notes (Signed)
Problem: Neurolinguistics Impaired (Adult)  Goal: *Acute Goals and Plan of Care (Insert Text)  Speech Therapy Goals  Re-evaluated 05/24/09 New goals as follows:  1. Patient will demonstrate OX4 with no cues within 7 days.  2. Patient will recall 2 facts after 5 minutes with no cues within 7 days.  3. Patient will provide 6 items in mod-complex categories with min. cues within 7 days.  4. Patient will provide 2-3 causes/effects to mod-complex problems with min cues within 7 days.    Initiated 05/19/09; Re-evaluated 05/24/09  1. Patient will be Ox4 with no cues within 7 days. NOT MET  2. Patient will complete simple attention tasks with no more than redirection x1 within 7 days. MET  3. Patient will provide 2-3 solutions to common ADL problems with min cues within 7 days. MET  4. Patient will name 3 category members for moderate-complex categorization task with min cues within 7 days. MET   SPEECH LANGUAGE PATHOLOGY TREATMENT: WEEKLY REASSESSMENT    NAME : Breanna Lester AGE: 48 y.o.  GENDER: female  DATE: 05/24/2009  PRIMARY DIAGNOSIS: AMS, hypokalemia, lft upper extremity pain  AMS, hypokalemia, lft upper extremity pain  AMS, hypokalemia, lft upper extremity pain  Colonoscopy  <principal problem not specified>  Procedure(s) (LRB):  ESOPHAGOGASTRODUODENOSCOPY (EGD) (N/A)  ESOPHAGOGASTRODUODENAL (EGD) BIOPSY (N/A)  7 Days Post-Op     SUBJECTIVE:  Patient's nurse stated that patient is hallucinating that her daughter is present with her in her room. Patient motivated to work in therapy.    OBJECTIVE:  Mental Status:  Level of Consciousness: Alert;Confused ((although easily reoriented))  Orientation Level: Disoriented to situation;Disoriented to time;Oriented to person;Oriented to place  Cognition: Follows commands  Perception: Appears intact  Perseveration: No perseveration noted  Safety/Judgement: Fall prevention    Neuro-Linguistics:     Orientation: Patient was initially oriented to person, year and situation. Re-oriented patient. After 5 minutes she was oriented to person, month, year and situation. Re-oriented patient.    Verbal Problem Solving: Verbal solutions;Functional. Patient was able to provide 2-3 solutions to functional ADL problems with no cues.   Verbal Organization: Divergent naming. Patient was able to provide 3 items in mod-complex categories with no cues.  Recall: Patient was able to recall 1/3 facts after 5 minutes with no cues (re-orientation)    Response & Tolerance to Activities:  Patient was participatory and motivated.    Pain:  None   0  After treatment:  [X]       Patient left in NAD  [X]       Call bell left within reach  [ ]       Caregiver present  [ ]       Patient left up in chair, nursing staff notified      ASSESSMENT AND PROGRESSION TOWARD GOALS:   Patient participated well and was motivated in session. Her nurse reported that she is often lucid, however at times is hallucinating and disoriented. Patient met 3/4 short term neurolinguistic goals this week. 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    Patient???s progression toward goals since last assessment: improving towards goals      PLAN OF CARE:   Goals have been updated based on progression since last assessment.    Patient continues to benefit from skilled intervention to address the above impairments.    Continue to follow patient 3  times/week  to address goals.    Recommendations and Planned  Interventions: Continue neurolinguistic therapy    Communication/Collaboration:  The patient???s plan of care was discussed with:  Registered Nurse    Randel Pigg, SLP    Time Calculation: 25 mins

## 2009-05-25 LAB — GLUCOSE, POC
Glucose (POC): 112 mg/dL — ABNORMAL HIGH (ref 65–105)
Glucose (POC): 165 mg/dL — ABNORMAL HIGH (ref 65–105)

## 2009-05-25 MED ADMIN — potassium chloride SR (KLOR-CON 10) tablet 20 mEq: ORAL | @ 13:00:00 | NDC 00074780419

## 2009-05-25 MED ADMIN — sodium chloride (NS) flush 10 mL: @ 18:00:00 | NDC 58016499501

## 2009-05-25 MED ADMIN — aspirin chewable tablet 81 mg: ORAL | @ 13:00:00

## 2009-05-25 MED ADMIN — pantoprazole (PROTONIX) tablet 40 mg: ORAL | @ 10:00:00 | NDC 00008084181

## 2009-05-25 MED ADMIN — sodium chloride (NS) flush 10 mL: @ 03:00:00 | NDC 87701099893

## 2009-05-25 MED ADMIN — rifaximin (XIFAXAN) tablet 400 mg: ORAL | @ 13:00:00 | NDC 65649030103

## 2009-05-25 MED ADMIN — heparin (porcine) pf 300 Units: @ 17:00:00

## 2009-05-25 MED ADMIN — sodium chloride (NS) flush 10 mL: @ 10:00:00 | NDC 87701099893

## 2009-05-25 MED ADMIN — metoprolol (LOPRESSOR) tablet 12.5 mg: ORAL | @ 02:00:00 | NDC 51079025501

## 2009-05-25 MED ADMIN — rifaximin (XIFAXAN) tablet 400 mg: ORAL | @ 23:00:00 | NDC 65649030103

## 2009-05-25 MED ADMIN — rifaximin (XIFAXAN) tablet 400 mg: ORAL | @ 03:00:00 | NDC 65649030103

## 2009-05-25 MED FILL — PROTONIX 40 MG TABLET,DELAYED RELEASE: 40 mg | ORAL | Qty: 1

## 2009-05-25 MED FILL — HEPARIN LOCK FLUSH (PORCINE) (PF) 100 UNIT/ML INTRAVENOUS SYRINGE: 100 unit/mL | INTRAVENOUS | Qty: 10

## 2009-05-25 MED FILL — SALINE FLUSH INJECTION SYRINGE: INTRAMUSCULAR | Qty: 30

## 2009-05-25 MED FILL — XIFAXAN 200 MG TABLET: 200 mg | ORAL | Qty: 2

## 2009-05-25 MED FILL — METOPROLOL TARTRATE 25 MG TAB: 25 mg | ORAL | Qty: 1

## 2009-05-25 MED FILL — SALINE FLUSH INJECTION SYRINGE: INTRAMUSCULAR | Qty: 10

## 2009-05-25 MED FILL — ASPIRIN 81 MG CHEWABLE TAB: 81 mg | ORAL | Qty: 1

## 2009-05-25 MED FILL — LORAZEPAM 2 MG/ML IJ SOLN: 2 mg/mL | INTRAMUSCULAR | Qty: 1

## 2009-05-25 MED FILL — K-TAB 10 MEQ TABLET,EXTENDED RELEASE: 10 mEq | ORAL | Qty: 2

## 2009-05-25 NOTE — Progress Notes (Signed)
 Formatting of this note is different from the original.  Problem: Self Care Deficits Care Plan (Adult)  Goal: *Acute Goals and Plan of Care (Insert Text)  Occupational Therapy Goals reviewed and revised ( as needed) as per 9/29 reevaluation.  1. Pt will complete grooming in supported position with minimal assistance within 7 days.  2. Pt will increase fine motor skills to open task containers (toothpaste, lotion, ect) with set up assist within 7 days.  3. Pt. Will perform SPT to/from [facility] with min assist within 7 days. (established 9/29)  4. Pt will tolerated x 10 min edge of bed with minimal assistance while completing dynamic balance activity within 7 days.  5. Pt will move from supine to sit with minimal assistance within 7 days, in preparation for functional mobility.   OCCUPATIONAL THERAPY TREATMENT    NAME: Breanna Lester AGE: 48 y.o.  GENDER: female  DATE: 05/25/2009  PRIMARY DIAGNOSIS: AMS, hypokalemia, lft upper extremity pain  AMS, hypokalemia, lft upper extremity pain  AMS, hypokalemia, lft upper extremity pain  Colonoscopy  Hepatic encephalopathy  Procedure(s) (LRB):  ESOPHAGOGASTRODUODENOSCOPY (EGD) (N/A)  ESOPHAGOGASTRODUODENAL (EGD) BIOPSY (N/A)  8 Days Post-Op     Precautions: fall, contact    Chart, occupational therapy assessment, plan of care, and goals were reviewed.    SUBJECTIVE:   Patient stated ? I really want to get stronger. I'm excited that I'm doing better .?    OBJECTIVE DATA SUMMARY:   Cognitive/Behavioral Status:  Level of Consciousness: Alert;Confused ( Patient is aware that she is hallucinating at times and is  easily reoriented)  Orientation Level: Disoriented to place (although patient able to self correct when cued)  Cognition: Follows commands  Safety/Judgement: Fall Dietitian and Transfers for ADLs:              Bed Mobility:              Overall level of assistance required: NT; patient received seated EOB              Transfers:              Sit to Stand:  Assist X1;Minimum assistance              Toilet Transfer : Minimum assistance (from standard commode using grab bar)              Bathroom Mobility: Minimum assistance (x2; HHA)  Balance:  Sitting: Intact  Standing: Impaired  Standing - Static: Constant support  Standing - Dynamic : Impaired  ADL Intervention:  Toileting  Toileting Assistance: Supervision/set up (for bowel hygiene performed in sitting. )  Bowel Hygiene:  (supervision seated on commode)  Clothing Management:  (contact guard-min assist standing holding onto grab bar)  Cues: Tactile cues provided;Verbal cues provided  Adaptive Equipment: Grab bars     Cognitive Retraining  Orientation Retraining: Reorienting  Problem Solving: Awareness of environment;Identifying the problem  Organizing/Sequencing: Breaking task down  Attention to Task: Distractibility  Following Commands: Follows two step commands/directions  Safety/Judgement: Fall prevention  Cues: Tactile cues provided;Verbal cues provided;Visual cues provided     Therapeutic Exercises: Patient educated with UE HEP. Performed the following exercises bilaterally:    EXERCISE   Sets   Reps   Active Active Assist   Passive   Comments   Shoulder raises 1 10 [X]     [ ]     [ ]         Shoulder press 1 10 [X]     [ ]     [ ]   Shoulder shrugs 1 10 [X]     [ ]     [ ]         Rowing (shoulders) 1 10 [X]     [ ]     [ ]         Finger flexion/extension 1 10 [X]     [ ]     [ ]           Pain:  Pain Scale: Verbal  Pain Intensity: 0  Pain Location: Back (says she has kidney pain)    Activity Tolerance:   Vitals Assessment 1:  Patient Position: Sitting  BP: 109/58 mmHg      After treatment:  [X]       Patient left in NAD  [X]       Call bell left within reach  [ ]       Caregiver present  [X]       Patient left up in chair, nursing staff notified    ASSESSMENT AND PROGRESSION TOWARD GOALS:   Patient pleasant and agreeable to therapy. Presents with visual deficits, NWB status through L wrist, decreased balance, strength,  and activity tolerance. Patient occasionally becomes confused, but is easily reoriented and is able to recognize that she is hallucinating when cued. Patient is very motivated to improve her independence in ADLs and functional mobility. Patient was able to ambulate to and from the bathroom with min assist (HHA) to perform ADLs and also ambulate within the room with PT this session (see PT note). Patient is generally debilitated and would benefit from intensive rehab to address deficits. Feel that patient would be able to tolerate 3 hours of therapy, and would greatly benefit from inpatient rehab to address deficits and improve independence in ADL performance.    Patient?s progression toward goals is as follows:  [X]       Improving appropriately and progressing toward goals  [ ]       Improving slowly and progressing toward goals  [ ]       Not making progress toward goals and plan of care will be adjusted    PLAN OF CARE:   Patient continues to benefit from skilled intervention to address the above impairments.  Continue treatment per established plan of care.  Planned Interventions:  [X]       Self Care Training                 [X]       Therapeutic Activities  [X]       Functional Mobility Training  [X]       Cognitive Retraining  [X]       Therapeutic Exercises           [X]       Endurance Activities  [X]       Balance Training                    [ ]       Neuromuscular Re-education  [X]       Patient Education                   [ ]       Family Training/Education  [X]       Visual/Perceptual Training    [ ]       Home safety training  [ ]       Other (Comment):  Discharge Recommendations:  [ ]       To be determined                                [ ]   Day rehabilitation program  [ ]       Home without services  (home safety education provided)  [ ]       Home with Home Health services  (home safety education provided)  [ ]      Outpatient therapy                                 [ ]       Nursing facility for rehab  [X]        Inpatient [facility]                   [ ]       Nursing facility for long term care  [ ]       Other:  Further Equipment Recommendations for Discharge: tbd  Communication/Collaboration:  The patient?s plan of care was discussed with: Physical Therapy Assistant    Vernell BRAVO. [place], COTA /L  Time Calculation: 28 mins          Electronically signed by Amado Elouise SQUIBB, OTR/L at 05/25/2009  4:18 PM EDT

## 2009-05-25 NOTE — Progress Notes (Signed)
 Formatting of this note is different from the original.  Nutrition Recommendations:   Continue current diet    Progress Note:  Diet upgraded by SLP to regular consistency since last F/U.  Pt currently asleep, but is reported to have a good appetite and PO intake.  K+ and Mag requiring repletion--pt has had some loose BMs, which may be contributing.    ______________________________________________________  POC:  Lab Results   Component Value Date/Time    POC GLUCOSE 112 05/25/2009  7:44 AM    POC GLUCOSE 165 05/24/2009  7:53 PM    POC GLUCOSE 103 05/24/2009  4:06 PM    POC GLUCOSE 107 05/24/2009 11:13 AM     Pt's BMI:  Estimated Body mass index is 26.49 kg/(m^2) as calculated from the following:    Height as of this encounter: 5' 2(1.575 m).    Weight as of this encounter: 144 lb 13.5 oz(65.7 kg).  Based on the BMI above, pt is overweight    Weight History: Wt Readings from Last 20 Encounters:   05/05/2009 144 lb 13.5 oz (65.7 kg)   05/05/2009 144 lb 13.5 oz (65.7 kg)   11/06/2008 154 lb (69.854 kg)   10/28/2008 149 lb 12.8 oz (67.949 kg)     ______________________________________________________  Thank you  Morna Solomons, RD  Pager [phone]    Electronically signed by Solomons Morna BROCKS, RD at 05/25/2009  9:04 AM EDT

## 2009-05-25 NOTE — Progress Notes (Signed)
 Formatting of this note might be different from the original.  Bedside shift change report given to oncoming nurse.  ricka    Electronically signed by Marcine Quale B at 05/25/2009 11:26 PM EDT

## 2009-05-25 NOTE — Progress Notes (Signed)
 Formatting of this note is different from the original.  Gastroenterology Progress Note    05/25/2009    Admit Date: 04/29/2009    Subjective:     Follow up for:  1)  Cirrhosis    2) encephalopathy    Patient is on Regular.    Pain: Patient complains of abdominal pain no.  T    There is no bleeding    Current facility-administered medications   Medication Dose Route Frequency   ? magnesium  sulfate 2 g/50 ml IVPB   2 g IntraVENous ONCE   ? potassium chloride  SR (KLOR-CON  10) tablet 20 mEq  20 mEq Oral DAILY   ? metoprolol  (LOPRESSOR ) tablet 12.5 mg  12.5 mg Oral Q12H   ? albuterol /ipratropium (DUONEB ) neb solution   1 Dose Nebulization Q4H PRN   ? lorazepam (ATIVAN) injection 1 mg  1 mg IntraVENous Q4H PRN   ? pantoprazole  (PROTONIX ) tablet 40 mg  40 mg Oral ACB   ? rifaximin (XIFAXAN) tablet 400 mg  400 mg Oral TID   ? aspirin chewable tablet 81 mg  81 mg Oral DAILY   ? albumin human 5% (BUMINATE) solution 25 g  25 g IntraVENous Q4H PRN   ? sodium chloride  (NS) flush 10 mL  10 mL InterCATHeter Q8H   ? sodium chloride  (NS) flush 10 mL  10 mL InterCATHeter PRN   ? heparin (porcine) pf 300 Units  300 Units InterCATHeter PRN   ? sodium chloride  (NS) flush 20 mL  20 mL InterCATHeter PRN   ? acetaminophen  (TYLENOL ) suppository 650 mg  650 mg Rectal Q6H PRN   ? saline peripheral flush 5 mL  5 mL InterCATHeter PRN         Objective:     Blood pressure 109/63, pulse 61, temperature 98.4 F (36.9 C), resp. rate 18, height 5' 2 (1.575 m), weight 144 lb 13.5 oz (65.7 kg), SpO2 96.00%, peak flow 40 L/min.        In: 180 (180 P.O.)  Out: 1     Alert, but not oriented (september, Saturday (today is Tuesday) but knows it is 2010  No asterixus  BS+  Umbilical hernia.  +hepatosplenomagaly    Data Review  Recent Results (from the past 24 hour(s))   GLUCOSE, POC    Collection Time    05/24/09  7:40 AM   Component Value Range   ? POC GLUCOSE 92  65 - 105 (mg/dL)   GLUCOSE, POC    Collection Time    05/24/09 11:13 AM   Component Value Range    ? POC GLUCOSE 107 (*) 65 - 105 (mg/dL)   GLUCOSE, POC    Collection Time    05/24/09  4:06 PM   Component Value Range   ? POC GLUCOSE 103  65 - 105 (mg/dL)   GLUCOSE, POC    Collection Time    05/24/09  7:53 PM   Component Value Range   ? 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

## 2009-05-25 NOTE — Progress Notes (Signed)
 Formatting of this note is different from the original.  Hospitalist Progress Note    NAME:  Breanna Lester   DOB:   Nov 02, 1960   MRN:   [id number]     Date/Time:  05/25/2009  Subjective:chronically ill, pale, white female.  No acute distress.   Chief Complaint:  None. i feel pretty good.  Eating breakfast.    Review of Systems:  Y  N       Y  N  []   [x]    Fever/chills                                               []   [x]    Chest Pain  []   [x]    Cough                                                       []   [x]    Diarrhea   []   [x]    Sputum                                                     []   [x]    Constipation  []   [x]    SOB/DOE                                                []   [x]    Nausea/Vomit  []   [x]    Abd Pain                                                    [x]   []    Tolerating PT  []   [x]    Dysuria                                                      [x]   []    Tolerating Diet     []  Unable to obtain  ROS due to  []  mental status change  []  sedated   []  intubated     Past Med History and Social history reviewed. No changes.   [x]  Current Medication list and allergies reviewed*    Objective:   Vitals:  BP 96/54  Pulse 62  Temp 98.4 F (36.9 C)  Resp 18  Ht 5' 2 (1.575 m)  Wt 144 lb 13.5 oz (65.7 kg)  SpO2 96%  PF 40 L/min  Temp (24hrs), Avg:98.4 F (36.9 C), Min:98 F (36.7 C), Max:98.9 F (37.2 C)    Last 24hr Input/Output:    Intake/Output Summary (Last 24 hours) at 05/25/09 0914  Last data filed at 05/25/09 0600   Gross per 24  hour   Intake    180 ml   Output      0 ml   Net    180 ml         PHYSICAL EXAM:  Gen:  []  WD []  WN  []  cachectic  []  thin  []  obese  []  disheveled             []   Critically ill or  [x]  Chronically ill appearing from [facility]SLD    HEENT:   [x]   red/pink conjunctivae []  pale conjunctivae                  PERRL  [x]  yes  []  no        hearing intact to voice [x]  yes  []  No               [x]  moist or  []  dry  Mucosa  NECK:   supple [x]  yes  []  no      masses []  yes  [x]  No                thyroid    [x]  non tender []  tender    RESP:   use of accessory muscles []  yes [x]  no                BS    [x]  clear  []  wheezing []  rhonchi []  crackles   CARD:  murmur  [x]  yes []  no   []  thrill  []  carotid bruits                 LE edema []  yes  [x]  no    []  JVD present    ABD:    tenderness []  yes [x]  no   Liver/spleen enlargement []   yes [x]   no                distended []   yes [x]  no    bowel sound [x]  normal []  hypo or  []  hyperactive    MSKL:   cyanosis/clubbing []  yes [x]  no         []   Spastic []  Flaccid []  Cog wheeling    SKIN:   Rashes []  yes [x]  no     Ulcers []  present ___               [x]  normal []  tight to palpitation        skin turgor [x]  good []  poor []  decreased    NEUR:   [x]  cranial nerves intact       []  L []  R weakness    [x]   follows commands     []   aphasic    [x]  alert  []  lethargic  []  stuporous  []  sedated             Alert and Oriented  [x]  time  [x]  place   [x]  person  PSYCH:   insight []  poor [x]  good     mood []  depressed []  anxious []  agitated                   []  Telemetry Reviewed     []  NSR []  PAC/PVCs   []  Afib  []  Paced   []  NSVT   []  Foley []  NGT  []  Intubated on vent    Lab Data Reviewed:  Lab Results   Component Value Date/Time    POC GLUCOSE 112 05/25/2009  7:44 AM    POC GLUCOSE  165 05/24/2009  7:53 PM    POC GLUCOSE 103 05/24/2009  4:06 PM    POC GLUCOSE 107 05/24/2009 11:13 AM    POC GLUCOSE 92 05/24/2009  7:40 AM     Recent Labs   Basename 05/24/09 0215   ? NA 138   ? K 3.3*   ? CL 106   ? CO2 25   ? BUN 4*   ? CREA 0.7   ? GLU 98   ? PHOS --   ? CA 9.0   ? ALB --   ? WBC --   ? HGB --   ? HCT --   ? PLT --      []    I have personally reviewed the   []  xray  []  CT scan    Assessment/Plan:     Patient Active Hospital Problem List:  *Hepatic encephalopathy (05/01/2009)    Nausea with vomiting (05/01/2009)    Hypokalemia (05/01/2009)    Gait abnormality (05/01/2009)    Alcoholic cirrhosis of liver (05/01/2009)    Hypomagnesemia (05/01/2009)    Anemia of other chronic disease  (05/25/2009)    Unspecified thrombocytopenia (05/25/2009)    ___________________________________________________  PLAN:  Risk of deterioration:  [x]  Low    [x]  Moderate  []  High    Assessment :    Plan:  1. Cirrhosis  2. Anemia chronic ds  3. Thrombocytopenia  4. Hepatic encephalopathy  5. Sepsis  6. resp failure  7. Hypomagnesemia  8. hypokalemia  7. dnr code status     1.  Stable.  Needs follow up at [facility] for liver transplant  2. Stable, no signs of bleed  3. Stable.  4. Improved on rifaximin  5. Off all antibiotics  6. Resolved  7. Replaced  8. replaced    Ok to [facility] once insurance issues resolved.     ___________________________________________________    Total time spent with patient: 40    minutes    []  Critical Care time:      minutes    Care Plan discussed with:no family at bedside    [x]  Patient   []  Family    []  Care Manager  []  Nursing   []  Consultant/Specialist :      []    >50% of visit spent in counseling and coordination of care   (Discussed []  CODE status,  []  Care Plan, []  D/C Planning)    Prophylaxis:  []  Lovenox   []  Coumadin  []  Hep SQ  []  SCD?s  []  H2B/PPI    Disposition:  []  Home w/ Family   []  HH PT,OT,RN   [x]  SNF/LTC   [x]  SAH/Rehab  ___________________________________________________    Hospitalist: Taft NOVAK. Czajkowski III, DO     ___________________________________________________    *Medications reviewed:  Current facility-administered medications   Medication Dose Route Frequency   ? magnesium  sulfate 2 g/50 ml IVPB   2 g IntraVENous ONCE   ? potassium chloride  SR (KLOR-CON  10) tablet 20 mEq  20 mEq Oral DAILY   ? metoprolol  (LOPRESSOR ) tablet 12.5 mg  12.5 mg Oral Q12H   ? albuterol /ipratropium (DUONEB ) neb solution   1 Dose Nebulization Q4H PRN   ? lorazepam (ATIVAN) injection 1 mg  1 mg IntraVENous Q4H PRN   ? pantoprazole  (PROTONIX ) tablet 40 mg  40 mg Oral ACB   ? rifaximin (XIFAXAN) tablet 400 mg  400 mg Oral TID   ? aspirin chewable tablet 81 mg  81 mg Oral DAILY   ?  albumin human 5%  (BUMINATE) solution 25 g  25 g IntraVENous Q4H PRN   ? sodium chloride  (NS) flush 10 mL  10 mL InterCATHeter Q8H   ? sodium chloride  (NS) flush 10 mL  10 mL InterCATHeter PRN   ? heparin (porcine) pf 300 Units  300 Units InterCATHeter PRN   ? sodium chloride  (NS) flush 20 mL  20 mL InterCATHeter PRN   ? acetaminophen  (TYLENOL ) suppository 650 mg  650 mg Rectal Q6H PRN   ? saline peripheral flush 5 mL  5 mL InterCATHeter PRN           Electronically signed by Abner Bars III, DO at 05/25/2009  9:21 AM EDT

## 2009-05-25 NOTE — Progress Notes (Signed)
 Formatting of this note might be different from the original.  Right arm PICC line dressing changed.  Sluggish blood return all ports.  Primary nurse notified to instill heparin per protocol.  Insertion site shows no s/s infection.  Electronically signed by Myrick Lamarr LABOR, RN at 05/25/2009 12:32 PM EDT

## 2009-05-25 NOTE — Progress Notes (Signed)
 Formatting of this note might be different from the original.  Pt increasingly agitated, hallucinating, and attempting to get out of chair/bed. Pt appears more confused, and is not easily reoriented. Ativan 1mg  IV given. Dr. Sanjuana notified of agitation. Advised the chart will be reviewed. Will continue to closely monitor the pt.   Electronically signed by [place], Erica M, RN at 05/25/2009  4:47 PM EDT

## 2009-05-25 NOTE — Progress Notes (Signed)
 Formatting of this note is different from the original.  Problem: Mobility Impaired (Adult)  Goal: *Acute Goals and Plan of Care (Insert Text)  Physical Therapy Goals  Revised 05/20/2009  1. Patient will move from supine to sit and sit to supine , scoot up and down and roll side to side in bed with modified independence within 7 day(s).   2. Patient will transfer from bed to chair and chair to bed with minimal assistance/CGA using the least restrictive device within 7 day(s).  3. Patient will perform sit to stand with minimal assistance/CGA within 7 day(s).  4. Patient will be able to ambulate with minimal assist for 20 feet within 7 days.   5. Patient will perform 1 sets of 15 repetitions of active strengthening exercises for bilateral upper and lower extremity(s) with supervision/set-up within 7 day(s). REMAINS    Initiated 05/12/2009  1. Patient will move from supine to sit and sit to supine , scoot up and down and roll side to side in bed with moderate assistance within 7 day(s).   2. Patient will transfer from bed to chair and chair to bed with moderate assistance using the least restrictive device within 7 day(s).  3. Patient will perform sit to stand with moderate assistance within 7 day(s).  4. Patient will be able to sit at EOB x 5 minutes while maintaining midline with minimum assist within 7 days.   5. Patient will perform 1 sets of 15 repetitions of active strengthening exercises for bilateral upper and lower extremity(s) with supervision/set-up within 7 day(s).     PHYSICAL THERAPY TREATMENT    NAME: Breanna Lester AGE: 48 y.o.  GENDER: female  DATE: 05/25/2009  PRIMARY DIAGNOSIS: AMS, hypokalemia, lft upper extremity pain  AMS, hypokalemia, lft upper extremity pain  AMS, hypokalemia, lft upper extremity pain  Colonoscopy  Hepatic encephalopathy  Procedure(s) (LRB):  ESOPHAGOGASTRODUODENOSCOPY (EGD) (N/A)  ESOPHAGOGASTRODUODENAL (EGD) BIOPSY (N/A)  8 Days Post-Op    Precautions: Fall  Chart , physical  therapy assessment, plan of care and goals  w ere  reviewed.    SUBJECTIVE:   Patient stated ? It is a lot hard to walk .?    OBJECTIVE DATA SUMMARY:   Critical Behavior:  Level of Consciousness: Alert;Confused  Orientation Level: Disoriented to situation;Disoriented to time  Cognition: Follows commands  Safety/Judgement: Fall Oceanographer:  Bed Mobility:  Overall level of assistance required: NT up at EOB  Transfers:  Sit to Stand: Assist X1;Minimum assistance  Balance:  Sitting: Impaired  Sitting - Static: Good (unsupported)  Sitting - Dynamic: Not tested  Standing: Impaired  Standing - Static: Constant support;Poor  Standing - Dynamic : Impaired  Ambulation /Gait Training :  Distance (ft): 70 Feet (ft)  Assistive Device:  (holding onto right arm of therapist)  Level of Assistance: Minimum assistance to support pt,    Pt had several LOB that required Mod assist to overcome   Difficulty at times with foot placement that may be due to visual deficits.  Therapeutic Exercises:  Reviewed exercises with pt, she was able to correctly demonstrate and was instructed to perform throughout the day.    Pain:  No c/o pain    Activity Tolerance: Per nursing  Vitals Assessment 1:  Patient Position: Sitting  BP: 109/58 mmHg  Pulse (Heart Rate): 62   O2 Sat (%): 96 %  O2 Device: Room air          After treatment:  [X]   Patient left in no apparent distress  [X]     Call bell left within reach  [ ]     Caregiver present  [X]     Patient left up in chair  with bed alarm on , nursing staff notified    ASSESSMENT AND PROGRESSION TOWARD GOALS:   Pt able to increase ambulation distance today and was able to recall exercises.  Pt required less assistance with gait training and with standing.  Pt had minimal confusion but was easily reoriented.  Pt may have some visual deficits that may impede her balance and mobility.   Pt very motivated and is much improved from last week.  Pt would greatly benefit from intense  therapy to help her with independence and safety when going home.    Patient?s progression toward goals is as follows:  [ ]     Improving appropriately and progressing toward goals  [X]     Improving slowly and progressing toward goals  [ ]     Not making progress toward goals and plan of care will be adjusted    PLAN OF CARE:   Patient continues to benefit from skilled intervention to address the above impairments.  Continue treatment per established plan of care.  Planned Interventions:  [X]     Bed Mobility Training                  [ ]     Neuromuscular Re-education  [X]     Transfer Training                        [ ]     Orthotic/Prosthetic Training  [X]     Gait Training                               [ ]     Modalities  [X]    Therapeutic Exercises               [ ]     Edema Management/Control  [X]     Therapeutic Activities                 [X]     Patient and Family Training/Education  [ ]     Other (comment):  Discharge Recommendations:  [ ]     To be determined                        [ ]     Day rehabilitation program  [ ]     Home without services                [ ]     Outpatient therapy  [ ]     Home with Home Health services           [ ]     Nursing facility for rehab  [X]     Inpatient Rehab Hospital             [ ]     Nursing facility for long term care  [ ]     Other:  Further Equipment Recommendations for Discharge: TBD  Communication/Collaboration:  The patient?s plan of care was discussed with:  Certified Occupational Therapy Assistant and Registered Nurse    Haven A. Mason  Time Calculation: 26 mins      Electronically signed by Jonette Shadow A at 05/25/2009 10:17 AM EDT

## 2009-05-25 NOTE — Progress Notes (Addendum)
[place] Pt. Last Name: [facility] Pt. First Name: Breanna Lester Rd MR#: [id number] / Admit#: [id number]   Jake Shark [id number] DOB: 1960-09-09 / Age: 48  Attn.: Cyndie Chime  Location: - [id number]        Case Management - Progress Note  Initial Open Date: 05/04/2009   Case Manager: Clarisa Fling    Initial Open Date: 05/17/2009  Social Worker:     Expected Date of Discharge:   Transferred From: home  [facility] Bed Held Until:   Bed Held By:     Power of Attorney:   POA/Guardian/Conservator Capacity:   Primary Caregiver: spouse Breanna Lester [phone]  Living Arrangements: Own Home    Source of Income:   Payee:   Psychosocial History:   Cultural/Religious/Language Issues:   Education Level:   ADLS/Current Living Arrangements Issues: Hx cirrhosis, walker and cane at home   as well as portable 02.    Past Providers:     Will patient perform self care at discharge? UKN    Anticipated Discharge Disposition Goal: Return to admission address    Assessment/Plan: 05/25/2009 04:11PSW met with pt and pt's husband at bedside.    Pt was attempting to get up while nurse was at bedside. Pt states that she   is upset because she feels that her doctor and therapists are upset with her.    She has no explanation as to why she feels that way. She also states that   she is aware of when she is having hallucinations and is able to realize that   they are not real. Pt was able to recall some of her medical history with SW   and her husband was able to validate most of that. Pt states that she is very   worried about her daughter and is very motivated to get back home. She feels   that she has been in and out of hospitals and facilities for a while now.   She feels that she just wants to get on the emergency waiting list for a liver   transplant. SW explained that pt had to continue to do therapy and try to   improve her strength to support her case. SW explained to pt what SW and her    husband were working on. She seemed to be very accepting of the plan overall   for her to get more therapy at [facility]. Pt's husband had the FA application and a   Medicaid application in hand. He states he will complete the FA form and   bring it back. SW explained that the form must be returned by tomorrow   morning. He is aware that pt will need to d/c home if she isn't accepted to   [facility] or if he does not complete the FA form. Pt did recall with SW and nurse   in the room an active hallucination that she had. She was not having it while   we were in the room, but states that it was earlier in the day. Pt was not   able to tell me this was a hallucination. Pt's husband also talked with SW   about code status and he feels that he wants to revoke the DNR as she is   improving. Pt's nurse, Alcario Drought contacted Dr. Lorella Nimrod and informed him of the   husbands decision. SW will continue to follow for d/c planning.Rowan Blase, MSW [id number]/11/2008  03:52PSW spoke with Carney Bern at [facility] and she has   spoken with pt's husband. She will be leaving a FA application in pt's room   for her husband. SW then spoke with pt's husband who is a bit overwhelmed by   everything still. He states that he has found it very hard to keep everything   organized at this point and is trying to get it all organized. He explained   to SW that he had insurance until he was laid off and was unable to pay for   the [employer]. He states that pt was about to begin the process for   evaluation for a liver transplant at [facility] when this happened. He states that   he is concerned about pt's hallucinations at times and is afraid of how that   may affect pt's dgtr, who has Aspergers. Mr. Barreira is aware that pt would   need to d/c home if [facility] does not accept pt. SW had also spoken with Nicki Guadalajara in   [facility] and she had explained that pt should apply for Medicaid as soon as   possible so that it may become retro-active. SW explained this to Mr. Bechtold    and gave him Tricia's number. SW encouraged Mr. Buonocore to contact [facility] and   start the Medicaid process. He states that he will be here to visit pt in the   AM and will f/u with SW and [facility]. SW will continue to follow for d/c   planning.05/24/2009 02:30PPt is reportedly responding better to therapy today.   Therapy spoke with [facility] and updated them as well on pt's status. SW spoke   with Carney Bern at [facility] regarding pt situation. She states that if pt continues to   make good progress they may be able to accept her in the next day or two.   Pt's husband will need to complete a FA form with them prior to admission. SW   has contacted pt's husband and left a vm. SW will continue to follow for d/c   planning.Rowan Blase, MSW [id number]/08/2008 11:27ASW spoke with Carney Bern at   [facility] and she states that they are interested in pt coming there. The concern   is whether pt can consistently participate with therapy. Carney Bern will continue   to monitor for possible placement at [facility]. SW was also told that [facility] did not   have an available bed today. Pt's husband remains interested in pt going to   [facility]. SW will continue to follow for d/c planning.Rowan Blase, MSW   [id number]/30/2010 12:24PSW spoke with pt's husband this morning regarding d/c   plans. He states that pt was living at home and was the primary caregiver for   their 48yo dgtr who has health issues. He states that she used a cane to   walk around the house, but was otherwise doing fairly well. He states that   her current status is really different than previously. He is now the only   income provider in the home and is caring for pt and their dgtr. He is unsure   if he can take her home based on her level of needs right now. He states   that he is considering trying to hire some assistance. SW offered to assist   him in this area if needed, but he was not ready for this yet. PT/OT feel   that pt may be a candidate fo inpatient rehab. SW shared this with pt's  husband and he was open to this. SW sent a referral to [facility] via [facility] at   husbands request. He also states that he and pt have a court date on Monday,   October 4 and he needed a letter to provide to the court as to why pt could   not come. SW wrote this letter and is leaving it on pt's chart for him. Mr.   Burgo also states that he was told that he could not apply for Medicaid   because they do not apply. SW explained that they do meet the criteria based   on SW's conversation with [facility]. SW contacted Nicki Guadalajara with [facility] and left her a vm   asking her to follow up with Mr. Lusher. SW will continue to follow for d/c   planning.Rowan Blase, MSW [id number]/27/2010 02:33PSW has reviewed pt's   chart. SW has spoken with [facility] and discussed pt's options. She states that   she spoke with pt's husband and that he has applied for Social Security   Disability and has a Clinical research associate that is assisting. She feels that they do meet   the criteria to apply for Medicaid, but the pt's husband denied applying for   Medicaid at this time. SW will f/u with pt's husband and will continue to   follow for d/c planning.Rowan Blase, MSW [id number]/16/2010 09:54A   Unresponsive in unit at this time.Vented and has propofol. Status post MI   also. Clydie Braun [facility] CRM09/14/2010 01:44P Referral sent to [facility] on this case   secondary self pay. Pt is married and her spouse Kathlene November is her emergency contact   [phone]. We will cont to follow for discharge needs prn. Clarisa Fling   C RM                                                    Resources at Discharge:           Service Providers at Discharge:          Dictating Provider: Christene Slates

## 2009-05-25 NOTE — Progress Notes (Signed)
Problem: Self Care Deficits Care Plan (Adult)  Goal: *Acute Goals and Plan of Care (Insert Text)  Occupational Therapy Goals reviewed and revised ( as needed) as per 9/29 reevaluation.  1. Pt will complete grooming in supported position with minimal assistance within 7 days.  2. Pt will increase fine motor skills to open task containers (toothpaste, lotion, ect) with set up assist within 7 days.  3. Pt. Will perform SPT to/from [facility] with min assist within 7 days. (established 9/29)  4. Pt will tolerated x 10 min edge of bed with minimal assistance while completing dynamic balance activity within 7 days.  5. Pt will move from supine to sit with minimal assistance within 7 days, in preparation for functional mobility.   OCCUPATIONAL THERAPY TREATMENT    NAME: Breanna Lester AGE: 48 y.o.  GENDER: female  DATE: 05/25/2009  PRIMARY DIAGNOSIS: AMS, hypokalemia, lft upper extremity pain  AMS, hypokalemia, lft upper extremity pain  AMS, hypokalemia, lft upper extremity pain  Colonoscopy  Hepatic encephalopathy  Procedure(s) (LRB):  ESOPHAGOGASTRODUODENOSCOPY (EGD) (N/A)  ESOPHAGOGASTRODUODENAL (EGD) BIOPSY (N/A)  8 Days Post-Op     Precautions: fall, contact    Chart, occupational therapy assessment, plan of care, and goals were reviewed.      SUBJECTIVE:   Patient stated ??? I really want to get stronger. I'm excited that I'm doing better .???     OBJECTIVE DATA SUMMARY:   Cognitive/Behavioral Status:  Level of Consciousness: Alert;Confused ( Patient is aware that she is hallucinating at times and is  easily reoriented)  Orientation Level: Disoriented to place (although patient able to self correct when cued)  Cognition: Follows commands  Safety/Judgement: Fall Dietitian and Transfers for ADLs:              Bed Mobility:              Overall level of assistance required: NT; patient received seated EOB              Transfers:              Sit to Stand: Assist X1;Minimum assistance               Toilet Transfer : Minimum assistance (from standard commode using grab bar)              Bathroom Mobility: Minimum assistance (x2; HHA)  Balance:  Sitting: Intact  Standing: Impaired  Standing - Static: Constant support  Standing - Dynamic : Impaired  ADL Intervention:  Toileting  Toileting Assistance: Supervision/set up (for bowel hygiene performed in sitting. )  Bowel Hygiene:  (supervision seated on commode)  Clothing Management:  (contact guard-min assist standing holding onto grab bar)  Cues: Tactile cues provided;Verbal cues provided  Adaptive Equipment: Grab bars     Cognitive Retraining  Orientation Retraining: Reorienting  Problem Solving: Awareness of environment;Identifying the problem  Organizing/Sequencing: Breaking task down  Attention to Task: Distractibility  Following Commands: Follows two step commands/directions  Safety/Judgement: Fall prevention  Cues: Tactile cues provided;Verbal cues provided;Visual cues provided     Therapeutic Exercises: Patient educated with UE HEP. Performed the following exercises bilaterally:    EXERCISE   Sets   Reps   Active Active Assist   Passive   Comments   Shoulder raises 1 10 [X]     [ ]     [ ]         Shoulder press 1 10 [X]     [ ]     [ ]   Shoulder shrugs 1 10 [X]     [ ]     [ ]         Rowing (shoulders) 1 10 [X]     [ ]     [ ]         Finger flexion/extension 1 10 [X]     [ ]     [ ]           Pain:  Pain Scale: Verbal  Pain Intensity: 0  Pain Location: Back (says she has kidney pain)    Activity Tolerance:   Vitals Assessment 1:  Patient Position: Sitting  BP: 109/58 mmHg        After treatment:  [X]       Patient left in NAD  [X]       Call bell left within reach  [ ]       Caregiver present  [X]       Patient left up in chair, nursing staff notified      ASSESSMENT AND PROGRESSION TOWARD GOALS:    Patient pleasant and agreeable to therapy. Presents with visual deficits, NWB status through L wrist, decreased balance, strength, and activity tolerance. Patient occasionally becomes confused, but is easily reoriented and is able to recognize that she is hallucinating when cued. Patient is very motivated to improve her independence in ADLs and functional mobility. Patient was able to ambulate to and from the bathroom with min assist (HHA) to perform ADLs and also ambulate within the room with PT this session (see PT note). Patient is generally debilitated and would benefit from intensive rehab to address deficits. Feel that patient would be able to tolerate 3 hours of therapy, and would greatly benefit from inpatient rehab to address deficits and improve independence in ADL performance.    Patient???s progression toward goals is as follows:  [X]       Improving appropriately and progressing toward goals  [ ]       Improving slowly and progressing toward goals  [ ]       Not making progress toward goals and plan of care will be adjusted      PLAN OF CARE:   Patient continues to benefit from skilled intervention to address the above impairments.  Continue treatment per established plan of care.  Planned Interventions:  [X]       Self Care Training                 [X]       Therapeutic Activities  [X]       Functional Mobility Training  [X]       Cognitive Retraining  [X]       Therapeutic Exercises           [X]       Endurance Activities  [X]       Balance Training                    [ ]       Neuromuscular Re-education  [X]       Patient Education                   [ ]       Family Training/Education  [X]       Visual/Perceptual Training    [ ]       Home safety training  [ ]       Other (Comment):  Discharge Recommendations:  [ ]       To be determined                                [ ]   Day rehabilitation program  [ ]       Home without services  (home safety education provided)   [ ]       Home with Home Health services  (home safety education provided)  [ ]      Outpatient therapy                                 [ ]       Nursing facility for rehab  [X]       Inpatient Rehab Hospital                   [ ]       Nursing facility for long term care  [ ]       Other:  Further Equipment Recommendations for Discharge: tbd  Communication/Collaboration:  The patient???s plan of care was discussed with: Physical Therapy Assistant    Vinnie Langton. Sherrie George, COTA /L  Time Calculation: 28 mins

## 2009-05-25 NOTE — Progress Notes (Signed)
Hospitalist Progress Note    NAME:  Breanna Lester   DOB:   1960/09/15   MRN:   [id number]     Date/Time:  05/25/2009  Subjective:chronically ill, pale, white female.  No acute distress.   Chief Complaint:  None. i feel pretty good.  Eating breakfast.          Review of Systems:  Y  N       Y  N  []   [x]    Fever/chills                                               []   [x]    Chest Pain  []   [x]    Cough                                                       []   [x]    Diarrhea   []   [x]    Sputum                                                     []   [x]    Constipation  []   [x]    SOB/DOE                                                []   [x]    Nausea/Vomit  []   [x]    Abd Pain                                                    [x]   []    Tolerating PT  []   [x]    Dysuria                                                      [x]   []    Tolerating Diet     []  Unable to obtain  ROS due to  []  mental status change  []  sedated   []  intubated     Past Med History and Social history reviewed. No changes.   [x]  Current Medication list and allergies reviewed*    Objective:   Vitals:  BP 96/54   Pulse 62   Temp 98.4 ??F (36.9 ??C)   Resp 18   Ht 5\' 2"  (1.575 m)   Wt 144 lb 13.5 oz (65.7 kg)   SpO2 96%   PF 40 L/min  Temp (24hrs), Avg:98.4 ??F (36.9 ??C), Min:98 ??F (36.7 ??C), Max:98.9 ??F (37.2 ??C)      Last 24hr Input/Output:    Intake/Output Summary (Last 24 hours) at 05/25/09 0914  Last data filed at 05/25/09 0600  Gross per 24 hour   Intake    180 ml   Output      0 ml   Net    180 ml          PHYSICAL EXAM:  Gen:  []  WD []  WN  []  cachectic  []  thin  []  obese  []  disheveled             []   Critically ill or  [x]  Chronically ill appearing from [facility]    HEENT:   [x]   red/pink conjunctivae []  pale conjunctivae                  PERRL  [x]  yes  []  no        hearing intact to voice [x]  yes  []  No               [x]  moist or  []  dry  Mucosa  NECK:   supple [x]  yes  []  no      masses []  yes  [x]  No               thyroid    [x]  non tender []  tender     RESP:   use of accessory muscles []  yes [x]  no                BS    [x]  clear  []  wheezing []  rhonchi []  crackles   CARD:  murmur  [x]  yes []  no   []  thrill  []  carotid bruits                 LE edema []  yes  [x]  no    []  JVD present    ABD:    tenderness []  yes [x]  no   Liver/spleen enlargement []   yes [x]   no                distended []   yes [x]  no    bowel sound [x]  normal []  hypo or  []  hyperactive    MSKL:   cyanosis/clubbing []  yes [x]  no         []   Spastic []  Flaccid []  Cog wheeling    SKIN:   Rashes []  yes [x]  no     Ulcers []  present ___               [x]  normal []  tight to palpitation        skin turgor [x]  good []  poor []  decreased    NEUR:   [x]  cranial nerves intact       []  L []  R weakness    [x]   follows commands     []   aphasic    [x]  alert  []  lethargic  []  stuporous  []  sedated             Alert and Oriented  [x]  time  [x]  place   [x]  person  PSYCH:   insight []  poor [x]  good     mood []  depressed []  anxious []  agitated                   []  Telemetry Reviewed     []  NSR []  PAC/PVCs   []  Afib  []  Paced   []  NSVT   []  Foley []  NGT  []  Intubated on vent    Lab Data Reviewed:  Lab Results   Component Value Date/Time    POC GLUCOSE 112 05/25/2009  7:44 AM  POC GLUCOSE 165 05/24/2009  7:53 PM    POC GLUCOSE 103 05/24/2009  4:06 PM    POC GLUCOSE 107 05/24/2009 11:13 AM    POC GLUCOSE 92 05/24/2009  7:40 AM       Recent Labs   Basename 05/24/09 0215   ??? NA 138   ??? K 3.3*   ??? CL 106   ??? CO2 25   ??? BUN 4*   ??? CREA 0.7   ??? GLU 98   ??? PHOS --   ??? CA 9.0   ??? ALB --   ??? WBC --   ??? HGB --   ??? HCT --   ??? PLT --          []    I have personally reviewed the   []  xray  []  CT scan    Assessment/Plan:     Patient Active Hospital Problem List:  *Hepatic encephalopathy (05/01/2009)    Nausea with vomiting (05/01/2009)    Hypokalemia (05/01/2009)    Gait abnormality (05/01/2009)    Alcoholic cirrhosis of liver (05/01/2009)    Hypomagnesemia (05/01/2009)    Anemia of other chronic disease (05/25/2009)     Unspecified thrombocytopenia (05/25/2009)     ___________________________________________________  PLAN:  Risk of deterioration:  [x]  Low    [x]  Moderate  []  High    Assessment :    Plan:  1. Cirrhosis  2. Anemia chronic ds  3. Thrombocytopenia  4. Hepatic encephalopathy  5. Sepsis  6. resp failure  7. Hypomagnesemia  8. hypokalemia  7. dnr code status       1.  Stable.  Needs follow up at [facility] for liver transplant  2. Stable, no signs of bleed  3. Stable.  4. Improved on rifaximin  5. Off all antibiotics  6. Resolved  7. Replaced  8. replaced      Ok to [facility] once insurance issues resolved.     ___________________________________________________    Total time spent with patient: 40    minutes    []  Critical Care time:      minutes    Care Plan discussed with:no family at bedside    [x]  Patient   []  Family    []  Care Manager  []  Nursing   []  Consultant/Specialist :      []    >50% of visit spent in counseling and coordination of care   (Discussed []  CODE status,  []  Care Plan, []  D/C Planning)    Prophylaxis:  []  Lovenox  []  Coumadin  []  Hep SQ  []  SCD???s  []  H2B/PPI    Disposition:  []  Home w/ Family   []  HH PT,OT,RN   [x]  SNF/LTC   [x]  SAH/Rehab  ___________________________________________________    Hospitalist: Shon Millet. Czajkowski III, DO     ___________________________________________________    *Medications reviewed:  Current facility-administered medications   Medication Dose Route Frequency   ??? magnesium sulfate 2 g/50 ml IVPB   2 g IntraVENous ONCE   ??? potassium chloride SR (KLOR-CON 10) tablet 20 mEq  20 mEq Oral DAILY   ??? metoprolol (LOPRESSOR) tablet 12.5 mg  12.5 mg Oral Q12H   ??? albuterol/ipratropium (DUONEB) neb solution   1 Dose Nebulization Q4H PRN   ??? lorazepam (ATIVAN) injection 1 mg  1 mg IntraVENous Q4H PRN   ??? pantoprazole (PROTONIX) tablet 40 mg  40 mg Oral ACB   ??? rifaximin (XIFAXAN) tablet 400 mg  400 mg Oral TID   ???  aspirin chewable tablet 81 mg  81 mg Oral DAILY    ??? albumin human 5% (BUMINATE) solution 25 g  25 g IntraVENous Q4H PRN   ??? sodium chloride (NS) flush 10 mL  10 mL InterCATHeter Q8H   ??? sodium chloride (NS) flush 10 mL  10 mL InterCATHeter PRN   ??? heparin (porcine) pf 300 Units  300 Units InterCATHeter PRN   ??? sodium chloride (NS) flush 20 mL  20 mL InterCATHeter PRN   ??? acetaminophen (TYLENOL) suppository 650 mg  650 mg Rectal Q6H PRN   ??? saline peripheral flush 5 mL  5 mL InterCATHeter PRN

## 2009-05-25 NOTE — Progress Notes (Signed)
Gastroenterology Progress Note    05/25/2009    Admit Date: 04/29/2009    Subjective:     Follow up for:  1)  Cirrhosis    2) encephalopathy    Patient is on Regular.    Pain: Patient complains of abdominal pain no.  T    There is no bleeding            Current facility-administered medications   Medication Dose Route Frequency   ??? magnesium sulfate 2 g/50 ml IVPB   2 g IntraVENous ONCE   ??? potassium chloride SR (KLOR-CON 10) tablet 20 mEq  20 mEq Oral DAILY   ??? metoprolol (LOPRESSOR) tablet 12.5 mg  12.5 mg Oral Q12H   ??? albuterol/ipratropium (DUONEB) neb solution   1 Dose Nebulization Q4H PRN   ??? lorazepam (ATIVAN) injection 1 mg  1 mg IntraVENous Q4H PRN   ??? pantoprazole (PROTONIX) tablet 40 mg  40 mg Oral ACB   ??? rifaximin (XIFAXAN) tablet 400 mg  400 mg Oral TID   ??? aspirin chewable tablet 81 mg  81 mg Oral DAILY   ??? albumin human 5% (BUMINATE) solution 25 g  25 g IntraVENous Q4H PRN   ??? sodium chloride (NS) flush 10 mL  10 mL InterCATHeter Q8H   ??? sodium chloride (NS) flush 10 mL  10 mL InterCATHeter PRN   ??? heparin (porcine) pf 300 Units  300 Units InterCATHeter PRN   ??? sodium chloride (NS) flush 20 mL  20 mL InterCATHeter PRN   ??? acetaminophen (TYLENOL) suppository 650 mg  650 mg Rectal Q6H PRN   ??? saline peripheral flush 5 mL  5 mL InterCATHeter PRN          Objective:     Blood pressure 109/63, pulse 61, temperature 98.4 ??F (36.9 ??C), resp. rate 18, height 5\' 2"  (1.575 m), weight 144 lb 13.5 oz (65.7 kg), SpO2 96.00%, peak flow 40 L/min.         In: 180 (180 P.O.)  Out: 1     Alert, but not oriented (september, Saturday (today is Tuesday) but knows it is 2010  No asterixus  BS+  Umbilical hernia.  +hepatosplenomagaly    Data Review  Recent Results (from the past 24 hour(s))   GLUCOSE, POC    Collection Time    05/24/09  7:40 AM   Component Value Range   ??? POC GLUCOSE 92  65 - 105 (mg/dL)   GLUCOSE, POC    Collection Time    05/24/09 11:13 AM   Component Value Range   ??? POC GLUCOSE 107 (*) 65 - 105 (mg/dL)    GLUCOSE, POC    Collection Time    05/24/09  4:06 PM   Component Value Range   ??? POC GLUCOSE 103  65 - 105 (mg/dL)   GLUCOSE, POC    Collection Time    05/24/09  7:53 PM   Component Value Range   ??? 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

## 2009-05-25 NOTE — Progress Notes (Signed)
Nutrition Recommendations:  ?? Continue current diet    Progress Note:  Diet upgraded by SLP to regular consistency since last F/U.  Pt currently asleep, but is reported to have a good appetite and PO intake.  K+ and Mag requiring repletion--pt has had some loose BMs, which may be contributing.    ______________________________________________________  POC:  Lab Results   Component Value Date/Time    POC GLUCOSE 112 05/25/2009  7:44 AM    POC GLUCOSE 165 05/24/2009  7:53 PM    POC GLUCOSE 103 05/24/2009  4:06 PM    POC GLUCOSE 107 05/24/2009 11:13 AM     Pt's BMI:  Estimated Body mass index is 26.49 kg/(m^2) as calculated from the following:    Height as of this encounter: 5\' 2" (1.575 m).    Weight as of this encounter: 144 lb 13.5 oz(65.7 kg).  Based on the BMI above, pt is overweight    Weight History: Wt Readings from Last 20 Encounters:   05/05/2009 144 lb 13.5 oz (65.7 kg)   05/05/2009 144 lb 13.5 oz (65.7 kg)   11/06/2008 154 lb (69.854 kg)   10/28/2008 149 lb 12.8 oz (67.949 kg)     ______________________________________________________  Thank you  Sonny Dandy, RD  Pager [phone]

## 2009-05-25 NOTE — Progress Notes (Signed)
Pt increasingly agitated, hallucinating, and attempting to get out of chair/bed. Pt appears more confused, and is not easily reoriented. Ativan 1mg  IV given. Dr. Tamsen Snider notified of agitation. Advised the chart will be reviewed. Will continue to closely monitor the pt.

## 2009-05-25 NOTE — Progress Notes (Signed)
Problem: Mobility Impaired (Adult)  Goal: *Acute Goals and Plan of Care (Insert Text)  Physical Therapy Goals  Revised 05/20/2009  1. Patient will move from supine to sit and sit to supine , scoot up and down and roll side to side in bed with modified independence within 7 day(s).   2. Patient will transfer from bed to chair and chair to bed with minimal assistance/CGA using the least restrictive device within 7 day(s).  3. Patient will perform sit to stand with minimal assistance/CGA within 7 day(s).  4. Patient will be able to ambulate with minimal assist for 20 feet within 7 days.   5. Patient will perform 1 sets of 15 repetitions of active strengthening exercises for bilateral upper and lower extremity(s) with supervision/set-up within 7 day(s). REMAINS    Initiated 05/12/2009  1. Patient will move from supine to sit and sit to supine , scoot up and down and roll side to side in bed with moderate assistance within 7 day(s).   2. Patient will transfer from bed to chair and chair to bed with moderate assistance using the least restrictive device within 7 day(s).  3. Patient will perform sit to stand with moderate assistance within 7 day(s).  4. Patient will be able to sit at EOB x 5 minutes while maintaining midline with minimum assist within 7 days.   5. Patient will perform 1 sets of 15 repetitions of active strengthening exercises for bilateral upper and lower extremity(s) with supervision/set-up within 7 day(s).     PHYSICAL THERAPY TREATMENT    NAME: Breanna Lester AGE: 48 y.o.  GENDER: female  DATE: 05/25/2009  PRIMARY DIAGNOSIS: AMS, hypokalemia, lft upper extremity pain  AMS, hypokalemia, lft upper extremity pain  AMS, hypokalemia, lft upper extremity pain  Colonoscopy  Hepatic encephalopathy  Procedure(s) (LRB):  ESOPHAGOGASTRODUODENOSCOPY (EGD) (N/A)  ESOPHAGOGASTRODUODENAL (EGD) BIOPSY (N/A)  8 Days Post-Op    Precautions: Fall  Chart , physical therapy assessment, plan of care and goals  w ere  reviewed.       SUBJECTIVE:   Patient stated ??? It is a lot hard to walk .???     OBJECTIVE DATA SUMMARY:   Critical Behavior:  Level of Consciousness: Alert;Confused  Orientation Level: Disoriented to situation;Disoriented to time  Cognition: Follows commands  Safety/Judgement: Fall Oceanographer:  Bed Mobility:  Overall level of assistance required: NT up at EOB  Transfers:  Sit to Stand: Assist X1;Minimum assistance  Balance:  Sitting: Impaired  Sitting - Static: Good (unsupported)  Sitting - Dynamic: Not tested  Standing: Impaired  Standing - Static: Constant support;Poor  Standing - Dynamic : Impaired  Ambulation /Gait Training :  Distance (ft): 70 Feet (ft)  Assistive Device:  (holding onto right arm of therapist)  Level of Assistance: Minimum assistance to support pt,    Pt had several LOB that required Mod assist to overcome   Difficulty at times with foot placement that may be due to visual deficits.  Therapeutic Exercises:  Reviewed exercises with pt, she was able to correctly demonstrate and was instructed to perform throughout the day.    Pain:  No c/o pain    Activity Tolerance: Per nursing  Vitals Assessment 1:  Patient Position: Sitting  BP: 109/58 mmHg  Pulse (Heart Rate): 62   O2 Sat (%): 96 %  O2 Device: Room air                  After treatment:  [X]   Patient left in no apparent distress  [X]     Call bell left within reach  [ ]     Caregiver present  [X]     Patient left up in chair  with bed alarm on , nursing staff notified      ASSESSMENT AND PROGRESSION TOWARD GOALS:    Pt able to increase ambulation distance today and was able to recall exercises.  Pt required less assistance with gait training and with standing.  Pt had minimal confusion but was easily reoriented.  Pt may have some visual deficits that may impede her balance and mobility.   Pt very motivated and is much improved from last week.  Pt would greatly benefit from intense therapy to help her with independence and safety when going home.    Patient???s progression toward goals is as follows:  [ ]     Improving appropriately and progressing toward goals  [X]     Improving slowly and progressing toward goals  [ ]     Not making progress toward goals and plan of care will be adjusted      PLAN OF CARE:   Patient continues to benefit from skilled intervention to address the above impairments.  Continue treatment per established plan of care.  Planned Interventions:  [X]     Bed Mobility Training                  [ ]     Neuromuscular Re-education  [X]     Transfer Training                        [ ]     Orthotic/Prosthetic Training  [X]     Gait Training                               [ ]     Modalities  [X]    Therapeutic Exercises               [ ]     Edema Management/Control  [X]     Therapeutic Activities                 [X]     Patient and Family Training/Education  [ ]     Other (comment):  Discharge Recommendations:  [ ]     To be determined                        [ ]     Day rehabilitation program  [ ]     Home without services                [ ]     Outpatient therapy  [ ]     Home with Home Health services           [ ]     Nursing facility for rehab  [X]     Inpatient Rehab Hospital             [ ]     Nursing facility for long term care  [ ]     Other:  Further Equipment Recommendations for Discharge: TBD  Communication/Collaboration:  The patient???s plan of care was discussed with:  Certified Occupational Therapy Assistant and Registered Nurse    Noam Franzen A. Valiant Dills  Time Calculation: 26 mins

## 2009-05-25 NOTE — Progress Notes (Signed)
Bedside shift change report given to oncoming nurse.  Carolina Sink

## 2009-05-25 NOTE — Progress Notes (Signed)
Right arm PICC line dressing changed.  Sluggish blood return all ports.  Primary nurse notified to instill heparin per protocol.  Insertion site shows no s/s infection.

## 2009-05-26 LAB — METABOLIC PANEL, COMPREHENSIVE
A-G Ratio: 0.6 — ABNORMAL LOW (ref 1.1–2.2)
ALT (SGPT): 31 U/L (ref 12–78)
AST (SGOT): 36 U/L (ref 15–37)
Albumin: 2.4 g/dL — ABNORMAL LOW (ref 3.5–5.0)
Alk. phosphatase: 122 U/L (ref 50–136)
Anion gap: 8 mmol/L (ref 5–15)
BUN/Creatinine ratio: 10 — ABNORMAL LOW (ref 12–20)
BUN: 6 MG/DL (ref 6–20)
Bilirubin, total: 1.4 MG/DL — ABNORMAL HIGH (ref 0.2–1.0)
CO2: 25 MMOL/L (ref 21–32)
Calcium: 9.1 MG/DL (ref 8.5–10.1)
Chloride: 106 MMOL/L (ref 97–108)
Creatinine: 0.6 MG/DL (ref 0.6–1.3)
GFR est AA: >60 mL/min/{1.73_m2} (ref >60)
GFR est non-AA: >60 mL/min/{1.73_m2} (ref >60)
Globulin: 4.1 g/dL — ABNORMAL HIGH (ref 2.0–4.0)
Glucose: 94 MG/DL (ref 65–100)
Potassium: 3.5 MMOL/L (ref 3.5–5.1)
Protein, total: 6.5 g/dL (ref 6.4–8.2)
Sodium: 139 MMOL/L (ref 136–145)

## 2009-05-26 LAB — CBC W/O DIFF
HCT: 27.7 % — ABNORMAL LOW (ref 35.0–47.0)
HGB: 8.6 g/dL — ABNORMAL LOW (ref 11.5–16.0)
MCH: 27.6 PG (ref 26.0–34.0)
MCHC: 31 g/dL (ref 30.0–36.5)
MCV: 88.8 FL (ref 80.0–99.0)
PLATELET: 98 10*3/uL — ABNORMAL LOW (ref 150–400)
RBC: 3.12 M/uL — ABNORMAL LOW (ref 3.80–5.20)
RDW: 19 % — ABNORMAL HIGH (ref 11.5–14.5)
WBC: 4.4 10*3/uL (ref 3.6–11.0)

## 2009-05-26 LAB — AMMONIA: Ammonia, plasma: 47 umol/L — ABNORMAL HIGH (ref <32)

## 2009-05-26 LAB — URINALYSIS W/ REFLEX CULTURE
Bacteria: NEGATIVE /HPF
Bilirubin: NEGATIVE
Blood: NEGATIVE
Glucose: NEGATIVE MG/DL
Ketone: NEGATIVE MG/DL
Leukocyte Esterase: NEGATIVE
Nitrites: NEGATIVE
Protein: NEGATIVE MG/DL
Specific gravity: 1.006 (ref 1.003–1.030)
Urobilinogen: 0.2 EU/DL (ref 0.2–1.0)
pH (UA): 6 (ref 5.0–8.0)

## 2009-05-26 LAB — MAGNESIUM: Magnesium: 1.5 MG/DL — ABNORMAL LOW (ref 1.6–2.4)

## 2009-05-26 MED ORDER — LACTULOSE 10 GRAM/15 ML ORAL SOLN
10 gram/15 mL | Freq: Every day | ORAL | Status: AC
Start: 2009-05-26 — End: 2010-05-21

## 2009-05-26 MED ORDER — ASPIRIN 81 MG CHEWABLE TAB
81 mg | ORAL_TABLET | Freq: Every day | ORAL | Status: DC
Start: 2009-05-26 — End: 2010-04-23

## 2009-05-26 MED ORDER — METOPROLOL TARTRATE 25 MG TAB
25 mg | ORAL_TABLET | Freq: Two times a day (BID) | ORAL | Status: DC
Start: 2009-05-26 — End: 2010-04-23

## 2009-05-26 MED ORDER — POTASSIUM CHLORIDE SR 10 MEQ TAB
10 mEq | ORAL_TABLET | Freq: Every day | ORAL | Status: DC
Start: 2009-05-26 — End: 2010-04-23

## 2009-05-26 MED ORDER — RIFAXIMIN 200 MG TAB
200 mg | ORAL_TABLET | Freq: Three times a day (TID) | ORAL | Status: AC
Start: 2009-05-26 — End: 2009-06-05

## 2009-05-26 MED ORDER — ALBUTEROL SULFATE 0.63 MG/3 ML SOLN FOR INHALATION
0.63 mg/3 mL | Freq: Four times a day (QID) | RESPIRATORY_TRACT | Status: DC | PRN
Start: 2009-05-26 — End: 2009-06-10

## 2009-05-26 MED ORDER — ACETAMINOPHEN 500 MG TAB
500 mg | ORAL_TABLET | Freq: Four times a day (QID) | ORAL | Status: DC | PRN
Start: 2009-05-26 — End: 2009-06-10

## 2009-05-26 MED ORDER — MAGNESIUM OXIDE 400 MG TAB
400 mg | ORAL_TABLET | Freq: Every day | ORAL | Status: DC
Start: 2009-05-26 — End: 2009-06-10

## 2009-05-26 MED ADMIN — rifaximin (XIFAXAN) tablet 400 mg: ORAL | @ 13:00:00 | NDC 65649030103

## 2009-05-26 MED ADMIN — pantoprazole (PROTONIX) tablet 40 mg: ORAL | @ 11:00:00 | NDC 00008084181

## 2009-05-26 MED ADMIN — potassium chloride SR (KLOR-CON 10) tablet 10 mEq: ORAL | @ 16:00:00 | NDC 00074780419

## 2009-05-26 MED ADMIN — metoprolol (LOPRESSOR) tablet 12.5 mg: ORAL | @ 01:00:00 | NDC 51079025501

## 2009-05-26 MED ADMIN — sodium chloride (NS) flush 10 mL: @ 11:00:00 | NDC 87701099893

## 2009-05-26 MED ADMIN — sodium chloride (NS) flush 10 mL: @ 01:00:00 | NDC 87701099893

## 2009-05-26 MED ADMIN — aspirin chewable tablet 81 mg: ORAL | @ 13:00:00

## 2009-05-26 MED ADMIN — rifaximin (XIFAXAN) tablet 400 mg: ORAL | @ 21:00:00 | NDC 65649030105

## 2009-05-26 MED ADMIN — rifaximin (XIFAXAN) tablet 400 mg: ORAL | @ 01:00:00 | NDC 65649030103

## 2009-05-26 MED ADMIN — sodium chloride (NS) flush 10 mL: @ 18:00:00 | NDC 87701099893

## 2009-05-26 MED ADMIN — magnesium oxide (MAG-OX) tablet 400 mg: ORAL | @ 16:00:00 | NDC 63739035410

## 2009-05-26 MED ADMIN — potassium chloride SR (KLOR-CON 10) tablet 20 mEq: ORAL | @ 13:00:00 | NDC 00074780419

## 2009-05-26 MED ADMIN — lactulose (CHRONULAC) solution 10 g: ORAL | @ 16:00:00 | NDC 00121457730

## 2009-05-26 MED FILL — K-TAB 10 MEQ TABLET,EXTENDED RELEASE: 10 mEq | ORAL | Qty: 1

## 2009-05-26 MED FILL — SALINE FLUSH INJECTION SYRINGE: INTRAMUSCULAR | Qty: 10

## 2009-05-26 MED FILL — SALINE FLUSH INJECTION SYRINGE: INTRAMUSCULAR | Qty: 30

## 2009-05-26 MED FILL — ASPIRIN 81 MG CHEWABLE TAB: 81 mg | ORAL | Qty: 1

## 2009-05-26 MED FILL — PROTONIX 40 MG TABLET,DELAYED RELEASE: 40 mg | ORAL | Qty: 1

## 2009-05-26 MED FILL — MAGNESIUM OXIDE 400 MG TAB: 400 mg | ORAL | Qty: 1

## 2009-05-26 MED FILL — XIFAXAN 200 MG TABLET: 200 mg | ORAL | Qty: 2

## 2009-05-26 MED FILL — FLEET ENEMA 19 GRAM-7 GRAM/118 ML: 19-7 gram/118 mL | RECTAL | Qty: 133

## 2009-05-26 MED FILL — K-TAB 10 MEQ TABLET,EXTENDED RELEASE: 10 mEq | ORAL | Qty: 2

## 2009-05-26 MED FILL — METOPROLOL TARTRATE 25 MG TAB: 25 mg | ORAL | Qty: 1

## 2009-05-26 MED FILL — LACTULOSE 10 GRAM/15 ML ORAL SOLN: 10 gram/15 mL | ORAL | Qty: 30

## 2009-05-26 NOTE — Progress Notes (Signed)
 Formatting of this note is different from the original.  Hospitalist Progress Note    NAME:  Breanna Lester   DOB:   Jan 19, 1961   MRN:   [id number]     Date/Time:  05/26/2009  Subjective:chronically ill, debilitated white female. Calm, nad, nontoxic.   Chief Complaint:  'i don't want to take the ativan anymore.'  More lucid today, ate a good breakfast.  nh3 slightly elevated this am.    Review of Systems:  Y  N       Y  N  []    [x]     Fever/chills                                               []    [x]     Chest Pain  []    [x]     Cough                                                       []    [x]     Diarrhea   []    [x]     Sputum                                                     []    [x]     Constipation  []    [x]     SOB/DOE                                                []    [x]     Nausea/Vomit  []    [x]     Abd Pain                                                    [x]    []     Tolerating PT  []    [x]     Dysuria                                                      [x]    []     Tolerating Diet     []   Unable to obtain  ROS due to  []   mental status change  []   sedated   []   intubated     Past Med History and Social history reviewed. No changes.   [x]   Current Medication list and allergies reviewed*    Objective:   Vitals:  BP 104/59  Pulse 66  Temp 98.4 F (36.9 C)  Resp 18  Ht 5' 2 (1.575 m)  Wt 144 lb 13.5 oz (65.7 kg)  SpO2 94%  PF 40 L/min  Temp (  24hrs), Avg:98.5 F (36.9 C), Min:98.3 F (36.8 C), Max:98.6 F (37 C)    Last 24hr Input/Output:  No intake or output data in the 24 hours ending 05/26/09 0956     PHYSICAL EXAM:gray appearing, looks older than stated age  Gen:  []   WD []   WN  []   cachectic  []   thin  []   obese  []   disheveled             []    Critically ill or  [x]   Chronically ill appearing from cirrhosis    HEENT:   [x]    red/pink conjunctivae []   pale conjunctivae                  PERRL  [x]   yes  []   no        hearing intact to voice [x]   yes  []   No               [x]   moist or  []   dry   Mucosa  NECK:   supple []   yes  [x]   no      masses []   yes  [x]   No               thyroid    [x]   non tender []   tender    RESP:   use of accessory muscles []   yes [x]   no                BS    [x]   clear  []   wheezing []   rhonchi []   crackles   CARD:  murmur  [x]   yes []   no   []   thrill  []   carotid bruits                 LE edema []   yes  [x]   no    []   JVD present    ABD:    tenderness []   yes [x]   no   Liver/spleen enlargement []    yes [x]    no                distended []    yes [x]   no    bowel sound [x]   normal []   hypo or  []   hyperactive    MSKL:   cyanosis/clubbing []   yes [x]   no         []    Spastic []   Flaccid []   Cog wheeling    SKIN:   Rashes []   yes [x]   no     Ulcers []   present ___               [x]   normal []   tight to palpitation        skin turgor [x]   good []   poor []   decreased    NEUR:   [x]   cranial nerves intact       []   L []   R weakness    [x]    follows commands     []    aphasic    [x]   alert  []   lethargic  []   stuporous  []   sedated             Alert and Oriented  []   time  []   place   []   person  PSYCH:   insight [x]   poor []   good     mood []   depressed []   anxious []   agitated                   []   Telemetry Reviewed     []   NSR []   PAC/PVCs   []   Afib  []   Paced   []   NSVT   []   Foley []   NGT  []   Intubated on vent    Lab Data Reviewed:  Lab Results   Component Value Date/Time    POC GLUCOSE 112 05/25/2009  7:44 AM    POC GLUCOSE 165 05/24/2009  7:53 PM    POC GLUCOSE 103 05/24/2009  4:06 PM    POC GLUCOSE 107 05/24/2009 11:13 AM    POC GLUCOSE 92 05/24/2009  7:40 AM     Recent Labs   Basename 05/26/09 0345 05/24/09 0215   ? NA 139 138   ? K 3.5 3.3*   ? CL 106 106   ? CO2 25 25   ? BUN 6 4*   ? CREA 0.6 0.7   ? GLU 94 98   ? PHOS -- --   ? CA 9.1 9.0   ? ALB 2.4* --   ? WBC 4.4 --   ? HGB 8.6* --   ? HCT 27.7* --   ? PLT 98* --      []     I have personally reviewed the   []   xray  []   CT scan    Assessment/Plan:     Patient Active Hospital Problem List:  *Hepatic encephalopathy  (05/01/2009)    Nausea with vomiting (05/01/2009)    Hypokalemia (05/01/2009)    Gait abnormality (05/01/2009)    Alcoholic cirrhosis of liver (05/01/2009)    Hypomagnesemia (05/01/2009)    Anemia of other chronic disease (05/25/2009)    Unspecified thrombocytopenia (05/25/2009)    ___________________________________________________  PLAN:  Risk of deterioration:  [x]   Low    [x]   Moderate  []   High    Assessment :    Plan:  1. Hepatic encephalopathy  2. Hypomagnesemia  3. Hypokalemia  4. Cirrhosis liver  5. Thrombocytopenia  6. Depression  7. Full code now     1.  Expected course is to be up and down, some days clearer than others, and can't always expect mental status to follow nh3 levels.  But will add low dose lactulose to rifaximin  2. Replace  3. Replace  4. Needs follow up at [facility] for liver transplant  5. Stable  6. Zoloft , d/c ativan    Stable for d/c to [facility] if able.     ___________________________________________________    Total time spent with patient: 40    minutes    []   Critical Care time:      minutes    Care Plan discussed with:no family this am    [x]   Patient   []   Family    [x]   Care Manager  []   Nursing   []   Consultant/Specialist :      []     >50% of visit spent in counseling and coordination of care   (Discussed []   CODE status,  []   Care Plan, []   D/C Planning)    Prophylaxis:  []   Lovenox   []   Coumadin  []   Hep SQ  []   SCD?s  []   H2B/PPI    Disposition:  []   Home w/ Family   []   HH PT,OT,RN   []   SNF/LTC   [x]   SAH/Rehab  ___________________________________________________  Hospitalist: Taft B. Czajkowski III, DO     ___________________________________________________    *Medications reviewed:  Current facility-administered medications   Medication Dose Route Frequency   ? potassium chloride  SR (KLOR-CON  10) tablet 20 mEq  20 mEq Oral DAILY   ? metoprolol  (LOPRESSOR ) tablet 12.5 mg  12.5 mg Oral Q12H   ? albuterol /ipratropium (DUONEB ) neb solution   1 Dose Nebulization Q4H PRN   ? lorazepam  (ATIVAN) injection 1 mg  1 mg IntraVENous Q4H PRN   ? pantoprazole  (PROTONIX ) tablet 40 mg  40 mg Oral ACB   ? rifaximin (XIFAXAN) tablet 400 mg  400 mg Oral TID   ? aspirin chewable tablet 81 mg  81 mg Oral DAILY   ? albumin human 5% (BUMINATE) solution 25 g  25 g IntraVENous Q4H PRN   ? sodium chloride  (NS) flush 10 mL  10 mL InterCATHeter Q8H   ? sodium chloride  (NS) flush 10 mL  10 mL InterCATHeter PRN   ? heparin (porcine) pf 300 Units  300 Units InterCATHeter PRN   ? sodium chloride  (NS) flush 20 mL  20 mL InterCATHeter PRN   ? acetaminophen  (TYLENOL ) suppository 650 mg  650 mg Rectal Q6H PRN   ? saline peripheral flush 5 mL  5 mL InterCATHeter PRN           Electronically signed by Abner Taft III, DO at 05/26/2009 10:16 AM EDT

## 2009-05-26 NOTE — Progress Notes (Signed)
 Formatting of this note might be different from the original.  Called Dr. Abner and got an ok to leave in PICC for transfer over to [facility]  Called report to [facility].  They requested that we feed her dinner prior to bringing her over.  Electronically signed by Prentiss Lloyd A at 05/26/2009  5:22 PM EDT

## 2009-05-26 NOTE — Progress Notes (Signed)
 Formatting of this note might be different from the original.  Took pt over to [facility] in wheelchair.  Pt belongings went with pt.  NO c/o pain.  Pt settled in at [facility]  Electronically signed by Prentiss Lloyd A at 05/26/2009  5:59 PM EDT

## 2009-05-26 NOTE — Progress Notes (Signed)
 Formatting of this note is different from the original.  Problem: Mobility Impaired (Adult)  Goal: *Acute Goals and Plan of Care (Insert Text)  Physical Therapy Goals  Revised 05/20/2009  1. Patient will move from supine to sit and sit to supine , scoot up and down and roll side to side in bed with modified independence within 7 day(s).   2. Patient will transfer from bed to chair and chair to bed with minimal assistance/CGA using the least restrictive device within 7 day(s).  3. Patient will perform sit to stand with minimal assistance/CGA within 7 day(s).  4. Patient will be able to ambulate with minimal assist for 20 feet within 7 days.   5. Patient will perform 1 sets of 15 repetitions of active strengthening exercises for bilateral upper and lower extremity(s) with supervision/set-up within 7 day(s). REMAINS    Initiated 05/12/2009  1. Patient will move from supine to sit and sit to supine , scoot up and down and roll side to side in bed with moderate assistance within 7 day(s).   2. Patient will transfer from bed to chair and chair to bed with moderate assistance using the least restrictive device within 7 day(s).  3. Patient will perform sit to stand with moderate assistance within 7 day(s).  4. Patient will be able to sit at EOB x 5 minutes while maintaining midline with minimum assist within 7 days.   5. Patient will perform 1 sets of 15 repetitions of active strengthening exercises for bilateral upper and lower extremity(s) with supervision/set-up within 7 day(s).     PHYSICAL THERAPY TREATMENT    NAME: Breanna Lester AGE: 48 y.o.  GENDER: female  DATE: 05/26/2009  PRIMARY DIAGNOSIS: AMS, hypokalemia, lft upper extremity pain  AMS, hypokalemia, lft upper extremity pain  AMS, hypokalemia, lft upper extremity pain  Colonoscopy  Hepatic encephalopathy  Procedure(s) (LRB):  ESOPHAGOGASTRODUODENOSCOPY (EGD) (N/A)  ESOPHAGOGASTRODUODENAL (EGD) BIOPSY (N/A)  9 Days Post-Op    Precautions: Fall  Chart , physical  therapy assessment, plan of care and goals  w ere  reviewed.    SUBJECTIVE:   Patient stated ? I am trying so hard .?    OBJECTIVE DATA SUMMARY:   Critical Behavior:  Level of Consciousness: Alert;Confused (however easily reoriented)  Orientation Level: Disoriented to place;Disoriented to situation;Disoriented to time;Oriented to person  Cognition: Decreased attention/concentration;Follows commands  Safety/Judgement: Fall prevention (better awareness of safety precautions this session)  Functional Mobility Training:  Bed Mobility:  Overall level of assistance required: Supervision  Transfers:  Sit to Stand: 1st trial-Minimum assistance (2nd trial -CGA)  Balance:  Sitting: Intact  Sitting - Static: Good (unsupported)  Sitting - Dynamic: Good (unsupported)  Standing: Impaired  Standing - Static: Constant support ((HHA), unable to WB through left wrist)  Standing - Dynamic : Impaired  Ambulation /Gait Training :  Distance (ft): 180 Feet (ft)  Assistive Device: Gait belt (HHA on right, Pt c/o dizziness after 20 feet, walked back to room; pt BP WFL)  Level of Assistance: Minimum assistance (Mod assist for several LOB throughout gait)  Base of Support: Widened  Level of Assistance: Minimum assistance (Mod assist for several LOB throughout gait)  Therapeutic Exercises:   Reviewed exercises and instructed pt to perform later in the day    Pain:  Pt stated no pain    Activity Tolerance:  Vitals Assessment 1:  Patient Position: Sitting;Pre-activity  BP: 105/53 mmHg  Pulse (Heart Rate): 70   O2 Sat (%): 97 %  O2 Device: Room air          After treatment:  [X]     Patient left in no apparent distress  [X]     Call bell left within reach  [ ]     Caregiver present  [X]     Patient left up in chair  with bed alarm , nursing staff notified    ASSESSMENT AND PROGRESSION TOWARD GOALS:   Pt seemed discouraged about her situation and reason for rehab on arrival but with encouragement seemed to have a more positive outlook.  Pt able to  increase ambulation distance today with minimal confusion.  Pt felt dizzy after 40 feet but BP was  [facility].  After dizziness subsided continued ambulation and pt very motivated to increase distance.  Pt did not attempt to push through left wrist when standing today.     Patient?s progression toward goals is as follows:  [ ]     Improving appropriately and progressing toward goals  [X]     Improving slowly and progressing toward goals  [ ]     Not making progress toward goals and plan of care will be adjusted    PLAN OF CARE:   Patient continues to benefit from skilled intervention to address the above impairments.  Continue treatment per established plan of care.  Planned Interventions:  [X]     Bed Mobility Training              [ ]     Neuromuscular Re-education  [X]     Transfer Training                      [ ]     Orthotic/Prosthetic Training  [X]     Gait Training                             [ ]     Modalities  [X]    Therapeutic Exercises             [ ]     Edema Management/Control  [X]     Therapeutic Activities              [X]     Patient and Family Training/Education  [ ]     Other (comment):  Discharge Recommendations:  [ ]     To be determined                      [ ]     Day rehabilitation program  [ ]     Home without services              [ ]     Outpatient therapy  [ ]     Home with Home Health services        [ ]     Nursing facility for rehab  [X]     Inpatient Rehab Hospital         [ ]     Nursing facility for long term care  [ ]     Other:  Further Equipment Recommendations for Discharge: TBD  Communication/Collaboration:  The patient?s plan of care was discussed with:  Certified Occupational Therapy Assistant and Registered Nurse    Breanna Lester  Time Calculation: 35 mins      Electronically signed by Jonette Shadow A at 05/26/2009  3:38 PM EDT

## 2009-05-26 NOTE — Discharge Summary (Signed)
 Formatting of this note is different from the original.    Hospitalist Discharge Summary     Patient ID:  Breanna Lester  [id number]  48 y.o.  17-Aug-1961    PCP on record: BELYNDA COCKING, MD    Admit date: 04/29/2009  Discharge date and time: 05/26/2009    Discharge Diagnoses: hepatic encephalopathy, cirrhosis, hypokalemia, hypomagnesemia                                            Consults: GI-dr ray [facility] Course by problems:admitted thru ed with hepatic encephalopathy.  Had a prolonged hospital course, mostly due to lack of insurance.  Pt finally accepted at [facility] for inpt rehab, where she will be d/c today.  Pt encouraged to follow up with liver transplant team at [facility] after d/c from rehab.  She has been somewhat hypotensive and hypo to euvolemic.  Her diuretics have been held, and will continue to be so until needed again.    Discharge Exam:  General:  Alert, cooperative, no distress, appears stated age.  HEENT:Atraumatic.  Anicteric sclerae, throat clear  Lungs: Clear BS. No Wheezing or Rhonchi. No rales.  Chest wall: No Accessory muscle use.  Heart:   Regular  rhythm, No murmur, No edema  Abdomen: Soft, non-tender. Not distended. Bowel sounds normal  Psych:Good insight.Not anxious or agitated.  Neurologic: Normal strength, Alert and oriented X 3.     Significant Diagnostic Studies:    Pertinent Lab Data:  Lab Results   Component Value Date/Time    WBC 4.4 05/26/2009  3:45 AM    HGB 8.6 05/26/2009  3:45 AM    PLATELET 98 05/26/2009  3:45 AM     Lab Results   Component Value Date/Time    Sodium 139 05/26/2009  3:45 AM    Potassium 3.5 05/26/2009  3:45 AM    Chloride 106 05/26/2009  3:45 AM    CO2 25 05/26/2009  3:45 AM    BUN 6 05/26/2009  3:45 AM    Creatinine 0.6 05/26/2009  3:45 AM    Calcium 9.1 05/26/2009  3:45 AM    Magnesium  1.5 05/26/2009  3:45 AM    Phosphorus 3.0 05/05/2009  5:30 AM       Lab Results   Component Value Date/Time    AST 36  05/26/2009  3:45 AM    ALT 31 05/26/2009  3:45 AM    Alk. phosphatase 122 05/26/2009  3:45 AM    Bilirubin, total 1.4 05/26/2009  3:45 AM    Protein, total 6.5 05/26/2009  3:45 AM    Albumin 2.4 05/26/2009  3:45 AM    Globulin 4.1 05/26/2009  3:45 AM     Lab Results   Component Value Date/Time    INR 1.4 05/17/2009  2:55 AM    Prothrombin Time-PT 14.4 05/17/2009  2:55 AM    aPTT 29.2 05/17/2009  2:55 AM       Lab Results   Component Value Date/Time    Iron 124 04/15/2008  4:10 AM    TIBC 272 04/15/2008  4:10 AM    Iron % saturation 46 04/15/2008  4:10 AM    Ferritin 68 04/15/2008  4:10 AM         DISCHARGE MEDICATIONS:   Current Discharge Medication List     START taking these medications  acetaminophen  (TYLENOL ) 500 mg tablet    Take 1 Tab by mouth every six (6) hours as needed for 360 days.    Qty: 2 Tab Refills: 0      aspirin 81 mg chewable tablet    Take 1 Tab by mouth daily for 360 days.    Qty: 30 Tab Refills: 11      magnesium  oxide (MAG-OX) 400 mg tablet    Take 1 Tab by mouth daily for 360 days.    Qty: 30 Tab Refills: 11      metoprolol  (LOPRESSOR ) 25 mg tablet    Take 0.5 Tabs by mouth every twelve (12) hours for 360 days.    Qty: 30 Tab Refills: 11      potassium chloride  SR (KLOR-CON  10) 10 mEq tablet    Take 1 Tab by mouth daily for 360 days.    Qty: 30 Tab Refills: 11      rifaximin (XIFAXAN) 200 mg tablet    Take 2 Tabs by mouth three (3) times daily for 10 days.    Qty: 60 Tab Refills: 0      albuterol  (ACCUNEB ) 0.63 mg/3 mL nebulizer solution    3 mL by Nebulization route every six (6) hours as needed for Wheezing for 360 days.    Qty: 1 mL Refills: 1       CONTINUE these medications which have CHANGED      lactulose (CHRONULAC) 10 gram/15 mL solution    Take 15 mL by mouth daily for 360 days.    Qty: 480 mL Refills: 2       CONTINUE these medications which have NOT CHANGED      sertraline  (ZOLOFT ) 100 mg tablet    TAKE 1 TABLET BY MOUTH EVERY DAY    Qty: 30 Tab Refills: 2       STOP taking these  medications      furosemide (LASIX) 80 mg tablet    Comments:     Reason for Stopping:       furosemide (LASIX) 80 mg tablet    Comments:     Reason for Stopping:       furosemide (LASIX) 80 mg tablet    Comments:     Reason for Stopping:       bumetanide (BUMEX) 1 mg tablet    Comments:     Reason for Stopping:       spironolactone (ALDACTONE) 50 mg tablet    Comments:     Reason for Stopping:          My Recommended Diet, Activity, Wound Care, and follow-up labs are listed in the patient's Discharge Insturctions which I have personally completed and reviewed.    Disposition:     []   Home with family     []   HH PT/RN   []   SNF/NH   [x]   Inpatient Rehab/SAHR  Condition at Discharge:  Stable    Follow up with:   Dr. At [facility] liver clinic   within 2 weeks after d/c from rehab.   Dr. belynda cocking also in 2 weeks.    Please follow-up tests/labs that are still pending:  1. None  2.    >35 minutes spent coordinating this discharge (review instructions/follow-up, prescriptions, preparing report for sign off)    Signed:  Taft B. Czajkowski III, DO  05/26/2009  3:37 PM   Electronically signed by Abner Taft III, DO at 05/26/2009  3:42 PM EDT

## 2009-05-26 NOTE — Progress Notes (Signed)
 Formatting of this note is different from the original.  Problem: Self Care Deficits Care Plan (Adult)  Goal: *Acute Goals and Plan of Care (Insert Text)  Occupational Therapy Goals reviewed and revised ( as needed) as per 9/29 reevaluation.  1. Pt will complete grooming in supported position with minimal assistance within 7 days. (surpassed 05/26/09)  2. Pt will increase fine motor skills to open task containers (toothpaste, lotion, ect) with set up assist within 7 days.  3. Pt. Will perform SPT to/from [facility] with min assist within 7 days. (established 9/29) (surpassed 05/25/09)  4. Pt will tolerated x 10 min edge of bed with minimal assistance while completing dynamic balance activity within 7 days.  5. Pt will move from supine to sit with minimal assistance within 7 days, in preparation for functional mobility. (surpassed 05/26/09)  OCCUPATIONAL THERAPY TREATMENT    NAME: Breanna Lester AGE: 48 y.o.  GENDER: female  DATE: 05/26/2009  PRIMARY DIAGNOSIS: AMS, hypokalemia, lft upper extremity pain  AMS, hypokalemia, lft upper extremity pain  AMS, hypokalemia, lft upper extremity pain  Colonoscopy  Hepatic encephalopathy  Procedure(s) (LRB):  ESOPHAGOGASTRODUODENOSCOPY (EGD) (N/A)  ESOPHAGOGASTRODUODENAL (EGD) BIOPSY (N/A)  9 Days Post-Op     Precautions: fall    Chart, occupational therapy assessment, plan of care, and goals were reviewed.    SUBJECTIVE:   Patient stated ? I feel like I'm in prison .?    OBJECTIVE DATA SUMMARY:   Cognitive/Behavioral Status:  Level of Consciousness: Alert;Confused (however easily reoriented)  Cognition: Decreased attention/concentration;Follows commands  Safety/Judgement: Fall prevention (better awareness of safety precautions this session)  Functional Mobility and Transfers for ADLs:              Bed Mobility:  Rolling: Supervision  Supine to Sit: Supervision  Scooting: Additional time (able to maintain NWB status with L wrist throughout session  following one initial cue at beginning of  session. No more cues needed throughout session.  )              Transfers:  Assisted PTA with transfers and ambulation. See PT note.              Sit to Stand: Assist X 2 ;Minimum assistance  initially, with cues able to correct posture and stand with CGA x1.                          Balance:  Sitting: Intact  Sitting - Static: Good (unsupported)  Sitting - Dynamic: Good (unsupported)  Standing: Impaired  Standing - Static: Constant support ((HHA), unable to WB through left wrist)  Standing - Dynamic : Impaired   ADL Intervention:  Groomin g  Brushing Teeth:  (following set up seated in chair. Difficulty finding items s econdary to visual deficits. Also difficulty manipulating cap on toothpaste )  Brushing/Combing Hair:  (able to comb hair using compensatory technique of flexing head forward. Able to reach all areas of head this session seated edge of bed with supervision )  Cues: Verbal cues provided;Tactile cues provided;Visual cues provided    Upper Body Dressing Assistance  Dressing Assistance: Minimum assistance (to don gown over wrist cast and up to shoulders seated edge  of bed  )    Lower Body Dressing Assistance  Socks:  (able to cross legs and adjust sock, difficulty seeing sock  during task )  Leg Crossed Method Used: Yes  Position Performed: Seated edge of bed with supervision  Cognitive Retraining  Orientation Retraining: Reorienting  Attention to Task: Distractibility  Safety/Judgement: Fall prevention (better awareness of safety precautions this session)  Cues: Verbal cues provided;Visual cues provided;Tactile cues provided    Therapeutic Exercises: Patient performed the following exercises to assist in improving UE strength and endurance needed for performance of transfers and ADLs.    EXERCISE   Sets   Reps   Active Active Assist   Passive   Comments   Shoulder raises 1 10 [X]  [ ]  [ ]  (able to raise to approximately 90 degrees)   Shoulder press 1 10 [X]  [ ]  [ ]      Finger flexion/extension 1 10  [X]  [ ]  [ ]      Shoulder shrugs 1 10 [X]  [ ]  [ ]        Pain:  Pain Scale: Numeric (0 - 10)  No c/o pain.    Activity Tolerance:   Vitals Assessment 1:  Patient Position: Sitting;Pre-activity  BP: 105/53 mmHg    Vitals Assessment 2:  Patient Position: Sitting;Post activity  BP 2: 115/65 mmHg      After treatment:  [X]    Patient left in NAD  [X]    Call bell left within reach  [ ]    Caregiver present  [X]    Patient left up in chair, nursing staff notified    ASSESSMENT AND PROGRESSION TOWARD GOALS:   Patient pleasant and agreeable to therapy. Initially patient seemed depressed regarding being in the hospital and her physical cabilities, but with encouragement and when commended on her progress in therapy had a more positive outlook. Patient presents with decreased balance, activity tolerance, some confusion (although easily reoriented), visual deficits, and NWB status on L wrist. Patient walked with PT then completed ADLs and UE exercises to increase strength and activity tolerance needed for performance of ADLs. Patient is making great progress towards goals and has surpassed her goals regarding sitting balance and BSC transfers (patient is able to ambulate to restroom with HHA). Patient also demonstrates improved UE strength, although she is still generally debilitated and weak in UEs. Improved safety awareness this session. Feel that patient is an excellent inpatient rehab candidate and would greatly benefit from intensive therapy to address deficits.    Patient?s progression toward goals is as follows:  [X]    Improving appropriately and progressing toward goals  [ ]    Improving slowly and progressing toward goals  [ ]    Not making progress toward goals and plan of care will be adjusted    PLAN OF CARE:   Patient continues to benefit from skilled intervention to address the above impairments.  Continue treatment per established plan of care.  Planned Interventions:  [X]    Self Care Training                    [X]      Therapeutic Activities  [X]    Functional Mobility Training     [X]     Cognitive Retraining  [X]    Therapeutic Exercises              [X]     Endurance Activities  [X]    Balance Training                       [ ]     Neuromuscular Re-education  [X]    Patient Education                      [ ]   Family Training/Education  [X]    Visual/Perceptual Training       [ ]     Home safety training  [ ]    Other (Comment):  Discharge Recommendations:  [ ]    To be determined                                   [ ]     Day rehabilitation program  [ ]    Home without services  (home safety education provided)  [ ]    Home with Home Health services  (home safety education provided)  [ ]   Outpatient therapy                                    [ ]     Nursing facility for rehab  [X]    Inpatient Rehab Hospital                      [ ]     Nursing facility for long term care  [ ]    Other:  Further Equipment Recommendations for Discharge: tbd  Communication/Collaboration:  The patient?s plan of care was discussed with:  Physical Therapy Assistant, Registered Nurse    Vernell BRAVO. [place], COTA /L  Time Calculation: 24 mins          Electronically signed by Amado Elouise SQUIBB, OTR/L at 05/26/2009  4:24 PM EDT

## 2009-05-26 NOTE — Progress Notes (Signed)
 Formatting of this note might be different from the original.  Problem: Breathing Pattern - Ineffective  Goal: *PALLIATIVE CARE: Alleviation of Dyspnea  Outcome: Resolved/Met Date Met:  05/26/09  Not applicable to pt at this time.      Electronically signed by Prentiss Lloyd A at 05/26/2009 10:57 AM EDT

## 2009-05-26 NOTE — Progress Notes (Addendum)
Hospitalist Progress Note    NAME:  Breanna Lester   DOB:   January 30, 1961   MRN:   [id number]     Date/Time:  05/26/2009  Subjective:chronically ill, debilitated white female. Calm, nad, nontoxic.   Chief Complaint:  'i don't want to take the ativan anymore.'  More lucid today, ate a good breakfast.  nh3 slightly elevated this am.          Review of Systems:  Y  N       Y  N  []    [x]     Fever/chills                                               []    [x]     Chest Pain  []    [x]     Cough                                                       []    [x]     Diarrhea   []    [x]     Sputum                                                     []    [x]     Constipation  []    [x]     SOB/DOE                                                []    [x]     Nausea/Vomit  []    [x]     Abd Pain                                                    [x]    []     Tolerating PT  []    [x]     Dysuria                                                      [x]    []     Tolerating Diet     []   Unable to obtain  ROS due to  []   mental status change  []   sedated   []   intubated     Past Med History and Social history reviewed. No changes.   [x]   Current Medication list and allergies reviewed*    Objective:   Vitals:  BP 104/59   Pulse 66   Temp 98.4 ??F (36.9 ??C)   Resp 18   Ht 5\' 2"  (1.575 m)   Wt 144 lb 13.5 oz (65.7 kg)   SpO2 94%   PF 40  L/min  Temp (24hrs), Avg:98.5 ??F (36.9 ??C), Min:98.3 ??F (36.8 ??C), Max:98.6 ??F (37 ??C)      Last 24hr Input/Output:  No intake or output data in the 24 hours ending 05/26/09 0956     PHYSICAL EXAM:gray appearing, looks older than stated age  Gen:  []   WD []   WN  []   cachectic  []   thin  []   obese  []   disheveled             []    Critically ill or  [x]   Chronically ill appearing from cirrhosis    HEENT:   [x]    red/pink conjunctivae []   pale conjunctivae                  PERRL  [x]   yes  []   no        hearing intact to voice [x]   yes  []   No               [x]   moist or  []   dry  Mucosa   NECK:   supple []   yes  [x]   no      masses []   yes  [x]   No               thyroid    [x]   non tender []   tender    RESP:   use of accessory muscles []   yes [x]   no                BS    [x]   clear  []   wheezing []   rhonchi []   crackles   CARD:  murmur  [x]   yes []   no   []   thrill  []   carotid bruits                 LE edema []   yes  [x]   no    []   JVD present    ABD:    tenderness []   yes [x]   no   Liver/spleen enlargement []    yes [x]    no                distended []    yes [x]   no    bowel sound [x]   normal []   hypo or  []   hyperactive    MSKL:   cyanosis/clubbing []   yes [x]   no         []    Spastic []   Flaccid []   Cog wheeling    SKIN:   Rashes []   yes [x]   no     Ulcers []   present ___               [x]   normal []   tight to palpitation        skin turgor [x]   good []   poor []   decreased    NEUR:   [x]   cranial nerves intact       []   L []   R weakness    [x]    follows commands     []    aphasic    [x]   alert  []   lethargic  []   stuporous  []   sedated             Alert and Oriented  []   time  []   place   []   person  PSYCH:   insight [x]   poor []   good     mood []   depressed []   anxious []   agitated                   []   Telemetry Reviewed     []   NSR []   PAC/PVCs   []   Afib  []   Paced   []   NSVT   []   Foley []   NGT  []   Intubated on vent    Lab Data Reviewed:  Lab Results   Component Value Date/Time    POC GLUCOSE 112 05/25/2009  7:44 AM    POC GLUCOSE 165 05/24/2009  7:53 PM    POC GLUCOSE 103 05/24/2009  4:06 PM    POC GLUCOSE 107 05/24/2009 11:13 AM    POC GLUCOSE 92 05/24/2009  7:40 AM       Recent Labs   Basename 05/26/09 0345 05/24/09 0215   ??? NA 139 138   ??? K 3.5 3.3*   ??? CL 106 106   ??? CO2 25 25   ??? BUN 6 4*   ??? CREA 0.6 0.7   ??? GLU 94 98   ??? PHOS -- --   ??? CA 9.1 9.0   ??? ALB 2.4* --   ??? WBC 4.4 --   ??? HGB 8.6* --   ??? HCT 27.7* --   ??? PLT 98* --          []     I have personally reviewed the   []   xray  []   CT scan    Assessment/Plan:     Patient Active Hospital Problem List:  *Hepatic encephalopathy (05/01/2009)     Nausea with vomiting (05/01/2009)    Hypokalemia (05/01/2009)    Gait abnormality (05/01/2009)    Alcoholic cirrhosis of liver (05/01/2009)    Hypomagnesemia (05/01/2009)    Anemia of other chronic disease (05/25/2009)    Unspecified thrombocytopenia (05/25/2009)     ___________________________________________________  PLAN:  Risk of deterioration:  [x]   Low    [x]   Moderate  []   High    Assessment :    Plan:  1. Hepatic encephalopathy  2. Hypomagnesemia  3. Hypokalemia  4. Cirrhosis liver  5. Thrombocytopenia  6. Depression  7. Full code now       1.  Expected course is to be up and down, some days clearer than others, and can't always expect mental status to follow nh3 levels.  But will add low dose lactulose to rifaximin  2. Replace  3. Replace  4. Needs follow up at [facility] for liver transplant  5. Stable  6. Zoloft, d/c ativan    Stable for d/c to [facility] if able.     ___________________________________________________    Total time spent with patient: 40    minutes    []   Critical Care time:      minutes    Care Plan discussed with:no family this am    [x]   Patient   []   Family    [x]   Care Manager  []   Nursing   []   Consultant/Specialist :      []     >50% of visit spent in counseling and coordination of care   (Discussed []   CODE status,  []   Care Plan, []   D/C Planning)    Prophylaxis:  []   Lovenox  []   Coumadin  []   Hep SQ  []   SCD???s  []   H2B/PPI    Disposition:  []   Home w/ Family   []   HH PT,OT,RN   []   SNF/LTC   [x]   SAH/Rehab  ___________________________________________________    Hospitalist: Shon Millet. Czajkowski III, DO     ___________________________________________________    *Medications reviewed:  Current facility-administered medications   Medication Dose Route Frequency   ??? potassium chloride SR (KLOR-CON 10) tablet 20 mEq  20 mEq Oral DAILY   ??? metoprolol (LOPRESSOR) tablet 12.5 mg  12.5 mg Oral Q12H   ??? albuterol/ipratropium (DUONEB) neb solution   1 Dose Nebulization Q4H PRN    ??? lorazepam (ATIVAN) injection 1 mg  1 mg IntraVENous Q4H PRN   ??? pantoprazole (PROTONIX) tablet 40 mg  40 mg Oral ACB   ??? rifaximin (XIFAXAN) tablet 400 mg  400 mg Oral TID   ??? aspirin chewable tablet 81 mg  81 mg Oral DAILY   ??? albumin human 5% (BUMINATE) solution 25 g  25 g IntraVENous Q4H PRN   ??? sodium chloride (NS) flush 10 mL  10 mL InterCATHeter Q8H   ??? sodium chloride (NS) flush 10 mL  10 mL InterCATHeter PRN   ??? heparin (porcine) pf 300 Units  300 Units InterCATHeter PRN   ??? sodium chloride (NS) flush 20 mL  20 mL InterCATHeter PRN   ??? acetaminophen (TYLENOL) suppository 650 mg  650 mg Rectal Q6H PRN   ??? saline peripheral flush 5 mL  5 mL InterCATHeter PRN

## 2009-05-26 NOTE — Progress Notes (Signed)
Called Dr. Lorella Nimrod and got an ok to leave in PICC for transfer over to [facility].  Called report to [facility].  They requested that we feed her dinner prior to bringing her over.

## 2009-05-26 NOTE — Progress Notes (Signed)
Problem: Mobility Impaired (Adult)  Goal: *Acute Goals and Plan of Care (Insert Text)  Physical Therapy Goals  Revised 05/20/2009  1. Patient will move from supine to sit and sit to supine , scoot up and down and roll side to side in bed with modified independence within 7 day(s).   2. Patient will transfer from bed to chair and chair to bed with minimal assistance/CGA using the least restrictive device within 7 day(s).  3. Patient will perform sit to stand with minimal assistance/CGA within 7 day(s).  4. Patient will be able to ambulate with minimal assist for 20 feet within 7 days.   5. Patient will perform 1 sets of 15 repetitions of active strengthening exercises for bilateral upper and lower extremity(s) with supervision/set-up within 7 day(s). REMAINS    Initiated 05/12/2009  1. Patient will move from supine to sit and sit to supine , scoot up and down and roll side to side in bed with moderate assistance within 7 day(s).   2. Patient will transfer from bed to chair and chair to bed with moderate assistance using the least restrictive device within 7 day(s).  3. Patient will perform sit to stand with moderate assistance within 7 day(s).  4. Patient will be able to sit at EOB x 5 minutes while maintaining midline with minimum assist within 7 days.   5. Patient will perform 1 sets of 15 repetitions of active strengthening exercises for bilateral upper and lower extremity(s) with supervision/set-up within 7 day(s).     PHYSICAL THERAPY TREATMENT    NAME: [name] AGE: 48 y.o.  GENDER: female  DATE: 05/26/2009  PRIMARY DIAGNOSIS: AMS, hypokalemia, lft upper extremity pain  AMS, hypokalemia, lft upper extremity pain  AMS, hypokalemia, lft upper extremity pain  Colonoscopy  Hepatic encephalopathy  Procedure(s) (LRB):  ESOPHAGOGASTRODUODENOSCOPY (EGD) (N/A)  ESOPHAGOGASTRODUODENAL (EGD) BIOPSY (N/A)  9 Days Post-Op    Precautions: Fall  Chart , physical therapy assessment, plan of care and goals  w ere  reviewed.      SUBJECTIVE:   Patient stated ??? I am trying so hard .???     OBJECTIVE DATA SUMMARY:   Critical Behavior:  Level of Consciousness: Alert;Confused (however easily reoriented)  Orientation Level: Disoriented to place;Disoriented to situation;Disoriented to time;Oriented to person  Cognition: Decreased attention/concentration;Follows commands  Safety/Judgement: Fall prevention (better awareness of safety precautions this session)  Functional Mobility Training:  Bed Mobility:  Overall level of assistance required: Supervision  Transfers:  Sit to Stand: 1st trial-Minimum assistance (2nd trial -CGA)  Balance:  Sitting: Intact  Sitting - Static: Good (unsupported)  Sitting - Dynamic: Good (unsupported)  Standing: Impaired  Standing - Static: Constant support ((HHA), unable to WB through left wrist)  Standing - Dynamic : Impaired  Ambulation /Gait Training :  Distance (ft): 180 Feet (ft)  Assistive Device: Gait belt (HHA on right, Pt c/o dizziness after 20 feet, walked back to room; pt BP WFL)  Level of Assistance: Minimum assistance (Mod assist for several LOB throughout gait)  Base of Support: Widened  Level of Assistance: Minimum assistance (Mod assist for several LOB throughout gait)  Therapeutic Exercises:   Reviewed exercises and instructed pt to perform later in the day    Pain:  Pt stated no pain    Activity Tolerance:  Vitals Assessment 1:  Patient Position: Sitting;Pre-activity  BP: 105/53 mmHg  Pulse (Heart Rate): 70   O2 Sat (%): 97 %  O2 Device: Room air  After treatment:  [X]     Patient left in no apparent distress  [X]     Call bell left within reach  [ ]     Caregiver present  [X]     Patient left up in chair  with bed alarm , nursing staff notified      ASSESSMENT AND PROGRESSION TOWARD GOALS:    Pt seemed discouraged about her situation and reason for rehab on arrival but with encouragement seemed to have a more positive outlook.  Pt able to increase ambulation distance today with minimal confusion.  Pt felt dizzy after 40 feet but BP was  [facility].  After dizziness subsided continued ambulation and pt very motivated to increase distance.  Pt did not attempt to push through left wrist when standing today.     Patient???s progression toward goals is as follows:  [ ]     Improving appropriately and progressing toward goals  [X]     Improving slowly and progressing toward goals  [ ]     Not making progress toward goals and plan of care will be adjusted      PLAN OF CARE:   Patient continues to benefit from skilled intervention to address the above impairments.  Continue treatment per established plan of care.  Planned Interventions:  [X]     Bed Mobility Training              [ ]     Neuromuscular Re-education  [X]     Transfer Training                      [ ]     Orthotic/Prosthetic Training  [X]     Gait Training                             [ ]     Modalities  [X]    Therapeutic Exercises             [ ]     Edema Management/Control  [X]     Therapeutic Activities              [X]     Patient and Family Training/Education  [ ]     Other (comment):  Discharge Recommendations:  [ ]     To be determined                      [ ]     Day rehabilitation program  [ ]     Home without services              [ ]     Outpatient therapy  [ ]     Home with Home Health services        [ ]     Nursing facility for rehab  [X]     Inpatient Rehab Hospital         [ ]     Nursing facility for long term care  [ ]     Other:  Further Equipment Recommendations for Discharge: TBD  Communication/Collaboration:  The patient???s plan of care was discussed with:  Certified Occupational Therapy Assistant and Registered Nurse    Brienna Bass A. Mandeep Kiser  Time Calculation: 35 mins

## 2009-05-26 NOTE — Progress Notes (Signed)
Took pt over to [facility] in wheelchair.  Pt belongings went with pt.  NO c/o pain.  Pt settled in at [facility].

## 2009-05-26 NOTE — Discharge Summary (Signed)
Hospitalist Discharge Summary     Patient ID:  Breanna Lester  [id number]  48 y.o.  Dec 15, 1960    PCP on record: Charlies Constable, MD    Admit date: 04/29/2009  Discharge date and time: 05/26/2009    Discharge Diagnoses: hepatic encephalopathy, cirrhosis, hypokalemia, hypomagnesemia                                                Consults: GI-dr [facility] Course by problems:admitted thru ed with hepatic encephalopathy.  Had a prolonged hospital course, mostly due to lack of insurance.  Pt finally accepted at [facility] for inpt rehab, where she will be d/c today.  Pt encouraged to follow up with liver transplant team at  [facility] after d/c from rehab.  She has been somewhat hypotensive and hypo to euvolemic.  Her diuretics have been held, and will continue to be so until needed again.        Discharge Exam:  General:???? ???????????? Alert, cooperative, no distress, appears stated age.????  HEENT:??????????????????????Atraumatic.  Anicteric sclerae, throat clear  Lungs:?? ????????????????????Clear BS.?? No Wheezing or Rhonchi. No rales.  Chest wall: ????????No Accessory muscle use.  Heart: ????????????????????  Regular  rhythm,?? No murmur, No edema  Abdomen:?? ????????Soft, non-tender. Not distended.?? Bowel sounds normal  Psych:????????????????????????Good insight.??Not anxious or agitated.  Neurologic:???????? Normal strength, Alert and oriented X 3.     Significant Diagnostic Studies:      Pertinent Lab Data:  Lab Results   Component Value Date/Time    WBC 4.4 05/26/2009  3:45 AM    HGB 8.6 05/26/2009  3:45 AM    PLATELET 98 05/26/2009  3:45 AM       Lab Results   Component Value Date/Time    Sodium 139 05/26/2009  3:45 AM    Potassium 3.5 05/26/2009  3:45 AM    Chloride 106 05/26/2009  3:45 AM    CO2 25 05/26/2009  3:45 AM    BUN 6 05/26/2009  3:45 AM    Creatinine 0.6 05/26/2009  3:45 AM    Calcium 9.1 05/26/2009  3:45 AM    Magnesium 1.5 05/26/2009  3:45 AM    Phosphorus 3.0 05/05/2009  5:30 AM        Lab Results   Component Value Date/Time    AST 36 05/26/2009  3:45 AM     ALT 31 05/26/2009  3:45 AM    Alk. phosphatase 122 05/26/2009  3:45 AM    Bilirubin, total 1.4 05/26/2009  3:45 AM    Protein, total 6.5 05/26/2009  3:45 AM    Albumin 2.4 05/26/2009  3:45 AM    Globulin 4.1 05/26/2009  3:45 AM       Lab Results   Component Value Date/Time    INR 1.4 05/17/2009  2:55 AM    Prothrombin Time-PT 14.4 05/17/2009  2:55 AM    aPTT 29.2 05/17/2009  2:55 AM        Lab Results   Component Value Date/Time    Iron 124 04/15/2008  4:10 AM    TIBC 272 04/15/2008  4:10 AM    Iron % saturation 46 04/15/2008  4:10 AM    Ferritin 68 04/15/2008  4:10 AM          DISCHARGE MEDICATIONS:   Current Discharge Medication List  START taking these medications       acetaminophen (TYLENOL) 500 mg tablet    Take 1 Tab by mouth every six (6) hours as needed for 360 days.    Qty: 2 Tab Refills: 0        aspirin 81 mg chewable tablet    Take 1 Tab by mouth daily for 360 days.    Qty: 30 Tab Refills: 11        magnesium oxide (MAG-OX) 400 mg tablet    Take 1 Tab by mouth daily for 360 days.    Qty: 30 Tab Refills: 11        metoprolol (LOPRESSOR) 25 mg tablet    Take 0.5 Tabs by mouth every twelve (12) hours for 360 days.    Qty: 30 Tab Refills: 11        potassium chloride SR (KLOR-CON 10) 10 mEq tablet    Take 1 Tab by mouth daily for 360 days.    Qty: 30 Tab Refills: 11        rifaximin (XIFAXAN) 200 mg tablet    Take 2 Tabs by mouth three (3) times daily for 10 days.    Qty: 60 Tab Refills: 0        albuterol (ACCUNEB) 0.63 mg/3 mL nebulizer solution    3 mL by Nebulization route every six (6) hours as needed for Wheezing for 360 days.    Qty: 1 mL Refills: 1          CONTINUE these medications which have CHANGED       lactulose (CHRONULAC) 10 gram/15 mL solution    Take 15 mL by mouth daily for 360 days.    Qty: 480 mL Refills: 2          CONTINUE these medications which have NOT CHANGED       sertraline (ZOLOFT) 100 mg tablet    TAKE 1 TABLET BY MOUTH EVERY DAY    Qty: 30 Tab Refills: 2           STOP taking these medications       furosemide (LASIX) 80 mg tablet    Comments:     Reason for Stopping:         furosemide (LASIX) 80 mg tablet    Comments:     Reason for Stopping:         furosemide (LASIX) 80 mg tablet    Comments:     Reason for Stopping:         bumetanide (BUMEX) 1 mg tablet    Comments:     Reason for Stopping:         spironolactone (ALDACTONE) 50 mg tablet    Comments:     Reason for Stopping:               My Recommended Diet, Activity, Wound Care, and follow-up labs are listed in the patient's Discharge Insturctions which I have personally completed and reviewed.      Disposition:     []   Home with family     []   HH PT/RN   []   SNF/NH   [x]   Inpatient Rehab/SAHR  Condition at Discharge:  Stable    Follow up with:   Dr. At [facility] liver clinic   within 2 weeks after d/c from rehab.   Dr. Charlies Constable also in 2 weeks.    Please follow-up tests/labs that are still pending:  1. None  2.    >  35 minutes spent coordinating this discharge (review instructions/follow-up, prescriptions, preparing report for sign off)    Signed:  Duffy Rhody B. Czajkowski III, DO  05/26/2009  3:37 PM

## 2009-05-26 NOTE — Progress Notes (Signed)
Problem: Self Care Deficits Care Plan (Adult)  Goal: *Acute Goals and Plan of Care (Insert Text)  Occupational Therapy Goals reviewed and revised ( as needed) as per 9/29 reevaluation.  1. Pt will complete grooming in supported position with minimal assistance within 7 days. (surpassed 05/26/09)  2. Pt will increase fine motor skills to open task containers (toothpaste, lotion, ect) with set up assist within 7 days.  3. Pt. Will perform SPT to/from [facility] with min assist within 7 days. (established 9/29) (surpassed 05/25/09)  4. Pt will tolerated x 10 min edge of bed with minimal assistance while completing dynamic balance activity within 7 days.  5. Pt will move from supine to sit with minimal assistance within 7 days, in preparation for functional mobility. (surpassed 05/26/09)  OCCUPATIONAL THERAPY TREATMENT    NAME: Breanna Lester AGE: 48 y.o.  GENDER: female  DATE: 05/26/2009  PRIMARY DIAGNOSIS: AMS, hypokalemia, lft upper extremity pain  AMS, hypokalemia, lft upper extremity pain  AMS, hypokalemia, lft upper extremity pain  Colonoscopy  Hepatic encephalopathy  Procedure(s) (LRB):  ESOPHAGOGASTRODUODENOSCOPY (EGD) (N/A)  ESOPHAGOGASTRODUODENAL (EGD) BIOPSY (N/A)  9 Days Post-Op     Precautions: fall    Chart, occupational therapy assessment, plan of care, and goals were reviewed.      SUBJECTIVE:   Patient stated ??? I feel like I'm in prison .???     OBJECTIVE DATA SUMMARY:   Cognitive/Behavioral Status:  Level of Consciousness: Alert;Confused (however easily reoriented)  Cognition: Decreased attention/concentration;Follows commands  Safety/Judgement: Fall prevention (better awareness of safety precautions this session)  Functional Mobility and Transfers for ADLs:              Bed Mobility:  Rolling: Supervision  Supine to Sit: Supervision  Scooting: Additional time (able to maintain NWB status with L wrist throughout session  following one initial cue at beginning of session. No more cues needed throughout session.  )               Transfers:  Assisted PTA with transfers and ambulation. See PT note.              Sit to Stand: Assist X 2 ;Minimum assistance  initially, with cues able to correct posture and stand with CGA x1.                          Balance:  Sitting: Intact  Sitting - Static: Good (unsupported)  Sitting - Dynamic: Good (unsupported)  Standing: Impaired  Standing - Static: Constant support ((HHA), unable to WB through left wrist)  Standing - Dynamic : Impaired   ADL Intervention:  Groomin g  Brushing Teeth:  (following set up seated in chair. Difficulty finding items s econdary to visual deficits. Also difficulty manipulating cap on toothpaste )  Brushing/Combing Hair:  (able to comb hair using compensatory technique of flexing head forward. Able to reach all areas of head this session seated edge of bed with supervision )  Cues: Verbal cues provided;Tactile cues provided;Visual cues provided    Upper Body Dressing Assistance  Dressing Assistance: Minimum assistance (to don gown over wrist cast and up to shoulders seated edge  of bed  )    Lower Body Dressing Assistance  Socks:  (able to cross legs and adjust sock, difficulty seeing sock  during task )  Leg Crossed Method Used: Yes  Position Performed: Seated edge of bed with supervision    Cognitive Retraining  Orientation Retraining: Reorienting  Attention to Task: Distractibility  Safety/Judgement: Fall prevention (better awareness of safety precautions this session)  Cues: Verbal cues provided;Visual cues provided;Tactile cues provided    Therapeutic Exercises: Patient performed the following exercises to assist in improving UE strength and endurance needed for performance of transfers and ADLs.    EXERCISE   Sets   Reps   Active Active Assist   Passive   Comments   Shoulder raises 1 10 [X]  [ ]  [ ]  (able to raise to approximately 90 degrees)   Shoulder press 1 10 [X]  [ ]  [ ]      Finger flexion/extension 1 10 [X]  [ ]  [ ]      Shoulder shrugs 1 10 [X]  [ ]  [ ]         Pain:  Pain Scale: Numeric (0 - 10)  No c/o pain.    Activity Tolerance:   Vitals Assessment 1:  Patient Position: Sitting;Pre-activity  BP: 105/53 mmHg    Vitals Assessment 2:  Patient Position: Sitting;Post activity  BP 2: 115/65 mmHg        After treatment:  [X]    Patient left in NAD  [X]    Call bell left within reach  [ ]    Caregiver present  [X]    Patient left up in chair, nursing staff notified      ASSESSMENT AND PROGRESSION TOWARD GOALS:   Patient pleasant and agreeable to therapy. Initially patient seemed depressed regarding being in the hospital and her physical cabilities, but with encouragement and when commended on her progress in therapy had a more positive outlook. Patient presents with decreased balance, activity tolerance, some confusion (although easily reoriented), visual deficits, and NWB status on L wrist. Patient walked with PT then completed ADLs and UE exercises to increase strength and activity tolerance needed for performance of ADLs. Patient is making great progress towards goals and has surpassed her goals regarding sitting balance and BSC transfers (patient is able to ambulate to restroom with HHA). Patient also demonstrates improved UE strength, although she is still generally debilitated and weak in UEs. Improved safety awareness this session. Feel that patient is an excellent inpatient rehab candidate and would greatly benefit from intensive therapy to address deficits.    Patient???s progression toward goals is as follows:  [X]    Improving appropriately and progressing toward goals  [ ]    Improving slowly and progressing toward goals  [ ]    Not making progress toward goals and plan of care will be adjusted      PLAN OF CARE:   Patient continues to benefit from skilled intervention to address the above impairments.  Continue treatment per established plan of care.  Planned Interventions:  [X]    Self Care Training                    [X]     Therapeutic Activities   [X]    Functional Mobility Training     [X]     Cognitive Retraining  [X]    Therapeutic Exercises              [X]     Endurance Activities  [X]    Balance Training                       [ ]     Neuromuscular Re-education  [X]    Patient Education                      [ ]   Family Training/Education  [X]    Visual/Perceptual Training       [ ]     Home safety training  [ ]    Other (Comment):  Discharge Recommendations:  [ ]    To be determined                                   [ ]     Day rehabilitation program  [ ]    Home without services  (home safety education provided)  [ ]    Home with Home Health services  (home safety education provided)  [ ]   Outpatient therapy                                    [ ]     Nursing facility for rehab  [X]    Inpatient Rehab Hospital                      [ ]     Nursing facility for long term care  [ ]    Other:  Further Equipment Recommendations for Discharge: tbd  Communication/Collaboration:  The patient???s plan of care was discussed with:  Physical Therapy Assistant, Registered Nurse    Vinnie Langton. Sherrie George, COTA /L  Time Calculation: 24 mins

## 2009-05-26 NOTE — Progress Notes (Signed)
Problem: Breathing Pattern - Ineffective  Goal: *PALLIATIVE CARE: Alleviation of Dyspnea  Outcome: Resolved/Met Date Met:  05/26/09  Not applicable to pt at this time.

## 2009-05-27 LAB — METABOLIC PANEL, COMPREHENSIVE
A-G Ratio: 0.6 — ABNORMAL LOW (ref 1.1–2.2)
ALT (SGPT): 30 U/L (ref 12–78)
AST (SGOT): 39 U/L — ABNORMAL HIGH (ref 15–37)
Albumin: 2.4 g/dL — ABNORMAL LOW (ref 3.5–5.0)
Alk. phosphatase: 115 U/L (ref 50–136)
Anion gap: 7 mmol/L (ref 5–15)
BUN/Creatinine ratio: 13 (ref 12–20)
BUN: 8 MG/DL (ref 6–20)
Bilirubin, total: 1.6 MG/DL — ABNORMAL HIGH (ref 0.2–1.0)
CO2: 26 MMOL/L (ref 21–32)
Calcium: 9.4 MG/DL (ref 8.5–10.1)
Chloride: 106 MMOL/L (ref 97–108)
Creatinine: 0.6 MG/DL (ref 0.6–1.3)
GFR est AA: >60 mL/min/{1.73_m2} (ref >60)
GFR est non-AA: >60 mL/min/{1.73_m2} (ref >60)
Globulin: 4 g/dL (ref 2.0–4.0)
Glucose: 101 MG/DL — ABNORMAL HIGH (ref 65–100)
Potassium: 3.5 MMOL/L (ref 3.5–5.1)
Protein, total: 6.4 g/dL (ref 6.4–8.2)
Sodium: 139 MMOL/L (ref 136–145)

## 2009-05-27 LAB — CBC W/O DIFF
HCT: 27.6 % — ABNORMAL LOW (ref 35.0–47.0)
HGB: 8.6 g/dL — ABNORMAL LOW (ref 11.5–16.0)
MCH: 27.6 PG (ref 26.0–34.0)
MCHC: 31.2 g/dL (ref 30.0–36.5)
MCV: 88.5 FL (ref 80.0–99.0)
PLATELET: 89 10*3/uL — ABNORMAL LOW (ref 150–400)
RBC: 3.12 M/uL — ABNORMAL LOW (ref 3.80–5.20)
RDW: 18.7 % — ABNORMAL HIGH (ref 11.5–14.5)
WBC: 3.8 10*3/uL (ref 3.6–11.0)

## 2009-05-27 LAB — MAGNESIUM: Magnesium: 1.5 MG/DL — ABNORMAL LOW (ref 1.6–2.4)

## 2009-05-27 MED ADMIN — metoprolol (LOPRESSOR) tablet 25 mg: ORAL | @ 01:00:00 | NDC 51079025501

## 2009-05-27 MED ADMIN — aspirin chewable tablet 81 mg: ORAL | @ 13:00:00

## 2009-05-27 MED ADMIN — potassium chloride SR (KLOR-CON 10) tablet 10 mEq: ORAL | @ 13:00:00 | NDC 00074780419

## 2009-05-27 MED ADMIN — pantoprazole (PROTONIX) tablet 40 mg: ORAL | @ 10:00:00 | NDC 00008084181

## 2009-05-27 MED ADMIN — sodium chloride (NS) 0.9 % flush: @ 09:00:00 | NDC 87701099893

## 2009-05-27 MED ADMIN — tramadol (ULTRAM) tablet 50 mg: ORAL | NDC 51079099120

## 2009-05-27 MED ADMIN — tramadol (ULTRAM) tablet 50 mg: ORAL | @ 19:00:00 | NDC 51079099101

## 2009-05-27 MED ADMIN — rifaximin (XIFAXAN) tablet 400 mg: ORAL | @ 21:00:00 | NDC 65649030105

## 2009-05-27 MED ADMIN — rifaximin (XIFAXAN) tablet 400 mg: ORAL | @ 02:00:00 | NDC 65649030105

## 2009-05-27 MED ADMIN — rifaximin (XIFAXAN) tablet 400 mg: ORAL | @ 13:00:00 | NDC 65649030105

## 2009-05-27 MED ADMIN — magnesium oxide (MAG-OX) tablet 400 mg: ORAL | @ 13:00:00 | NDC 63739035410

## 2009-05-27 MED ADMIN — lactulose (CHRONULAC) solution 10 g: ORAL | @ 13:00:00 | NDC 00121457730

## 2009-05-27 MED ADMIN — quetiapine (SEROQUEL) tablet 25 mg: ORAL | NDC 00310027510

## 2009-05-27 MED ADMIN — sertraline (ZOLOFT) tablet 100 mg: ORAL | @ 13:00:00 | NDC 00904608861

## 2009-05-27 MED FILL — K-TAB 10 MEQ TABLET,EXTENDED RELEASE: 10 mEq | ORAL | Qty: 1

## 2009-05-27 MED FILL — XIFAXAN 200 MG TABLET: 200 mg | ORAL | Qty: 2

## 2009-05-27 MED FILL — METOPROLOL TARTRATE 25 MG TAB: 25 mg | ORAL | Qty: 1

## 2009-05-27 MED FILL — PROTONIX 40 MG TABLET,DELAYED RELEASE: 40 mg | ORAL | Qty: 1

## 2009-05-27 MED FILL — SEROQUEL 25 MG TABLET: 25 mg | ORAL | Qty: 1

## 2009-05-27 MED FILL — TRAMADOL 50 MG TAB: 50 mg | ORAL | Qty: 1

## 2009-05-27 MED FILL — SALINE FLUSH INJECTION SYRINGE: INTRAMUSCULAR | Qty: 20

## 2009-05-27 MED FILL — MAGNESIUM OXIDE 400 MG TAB: 400 mg | ORAL | Qty: 1

## 2009-05-27 MED FILL — LACTULOSE 10 GRAM/15 ML ORAL SOLN: 10 gram/15 mL | ORAL | Qty: 30

## 2009-05-27 MED FILL — ASPIRIN 81 MG CHEWABLE TAB: 81 mg | ORAL | Qty: 1

## 2009-05-27 MED FILL — SERTRALINE 50 MG TAB: 50 mg | ORAL | Qty: 2

## 2009-05-28 MED ADMIN — aspirin chewable tablet 81 mg: ORAL | @ 13:00:00

## 2009-05-28 MED ADMIN — potassium chloride SR (KLOR-CON 10) tablet 10 mEq: ORAL | @ 13:00:00 | NDC 00074780419

## 2009-05-28 MED ADMIN — magnesium oxide (MAG-OX) tablet 400 mg: ORAL | @ 13:00:00 | NDC 63739035410

## 2009-05-28 MED ADMIN — rifaximin (XIFAXAN) tablet 400 mg: ORAL | @ 13:00:00 | NDC 65649030105

## 2009-05-28 MED ADMIN — rifaximin (XIFAXAN) tablet 400 mg: ORAL | @ 02:00:00 | NDC 65649030105

## 2009-05-28 MED ADMIN — sertraline (ZOLOFT) tablet 100 mg: ORAL | @ 13:00:00 | NDC 00904608861

## 2009-05-28 MED ADMIN — lactulose (CHRONULAC) solution 10 g: ORAL | @ 13:00:00 | NDC 00121457730

## 2009-05-28 MED ADMIN — pantoprazole (PROTONIX) tablet 40 mg: ORAL | @ 10:00:00 | NDC 00008084181

## 2009-05-28 MED ADMIN — rifaximin (XIFAXAN) tablet 400 mg: ORAL | @ 22:00:00 | NDC 65649030105

## 2009-05-28 MED FILL — XIFAXAN 200 MG TABLET: 200 mg | ORAL | Qty: 2

## 2009-05-28 MED FILL — METOPROLOL TARTRATE 25 MG TAB: 25 mg | ORAL | Qty: 1

## 2009-05-28 MED FILL — SEROQUEL 25 MG TABLET: 25 mg | ORAL | Qty: 1

## 2009-05-28 MED FILL — ASPIRIN 81 MG CHEWABLE TAB: 81 mg | ORAL | Qty: 1

## 2009-05-28 MED FILL — PROTONIX 40 MG TABLET,DELAYED RELEASE: 40 mg | ORAL | Qty: 1

## 2009-05-28 MED FILL — K-TAB 10 MEQ TABLET,EXTENDED RELEASE: 10 mEq | ORAL | Qty: 1

## 2009-05-28 MED FILL — TRAMADOL 50 MG TAB: 50 mg | ORAL | Qty: 1

## 2009-05-28 MED FILL — LACTULOSE 10 GRAM/15 ML ORAL SOLN: 10 gram/15 mL | ORAL | Qty: 30

## 2009-05-28 MED FILL — MAGNESIUM OXIDE 400 MG TAB: 400 mg | ORAL | Qty: 1

## 2009-05-28 MED FILL — SERTRALINE 50 MG TAB: 50 mg | ORAL | Qty: 2

## 2009-05-29 LAB — METABOLIC PANEL, BASIC
Anion gap: 6 mmol/L (ref 5–15)
BUN/Creatinine ratio: 12 (ref 12–20)
BUN: 7 MG/DL (ref 6–20)
CO2: 26 MMOL/L (ref 21–32)
Calcium: 9.1 MG/DL (ref 8.5–10.1)
Chloride: 109 MMOL/L — ABNORMAL HIGH (ref 97–108)
Creatinine: 0.6 MG/DL (ref 0.6–1.3)
GFR est AA: >60 mL/min/{1.73_m2} (ref >60)
GFR est non-AA: >60 mL/min/{1.73_m2} (ref >60)
Glucose: 93 MG/DL (ref 65–100)
Potassium: 3.1 MMOL/L — ABNORMAL LOW (ref 3.5–5.1)
Sodium: 141 MMOL/L (ref 136–145)

## 2009-05-29 LAB — CBC W/O DIFF
HCT: 26.9 % — ABNORMAL LOW (ref 35.0–47.0)
HGB: 8.2 g/dL — ABNORMAL LOW (ref 11.5–16.0)
MCH: 26.9 PG (ref 26.0–34.0)
MCHC: 30.5 g/dL (ref 30.0–36.5)
MCV: 88.2 FL (ref 80.0–99.0)
PLATELET: 79 10*3/uL — ABNORMAL LOW (ref 150–400)
RBC: 3.05 M/uL — ABNORMAL LOW (ref 3.80–5.20)
RDW: 18.3 % — ABNORMAL HIGH (ref 11.5–14.5)
WBC: 3.3 10*3/uL — ABNORMAL LOW (ref 3.6–11.0)

## 2009-05-29 LAB — AMMONIA: Ammonia, plasma: 39 umol/L — ABNORMAL HIGH (ref <32)

## 2009-05-29 MED ADMIN — lactulose (CHRONULAC) solution 10 g: ORAL | @ 13:00:00 | NDC 00121457730

## 2009-05-29 MED ADMIN — sertraline (ZOLOFT) tablet 100 mg: ORAL | @ 13:00:00 | NDC 00904608861

## 2009-05-29 MED ADMIN — tramadol (ULTRAM) tablet 50 mg: ORAL | @ 02:00:00 | NDC 51079099101

## 2009-05-29 MED ADMIN — metoprolol (LOPRESSOR) tablet 25 mg: ORAL | @ 13:00:00 | NDC 51079025501

## 2009-05-29 MED ADMIN — rifaximin (XIFAXAN) tablet 400 mg: ORAL | @ 13:00:00 | NDC 65649030105

## 2009-05-29 MED ADMIN — potassium chloride SR (KLOR-CON 10) tablet 10 mEq: ORAL | @ 13:00:00 | NDC 00074780419

## 2009-05-29 MED ADMIN — aspirin chewable tablet 81 mg: ORAL | @ 13:00:00

## 2009-05-29 MED ADMIN — rifaximin (XIFAXAN) tablet 400 mg: ORAL | @ 21:00:00 | NDC 65649030105

## 2009-05-29 MED ADMIN — heparin (porcine) pf 300 Units: @ 17:00:00

## 2009-05-29 MED ADMIN — magnesium oxide (MAG-OX) tablet 400 mg: ORAL | @ 13:00:00 | NDC 63739035410

## 2009-05-29 MED ADMIN — quetiapine (SEROQUEL) tablet 25 mg: ORAL | @ 02:00:00 | NDC 00310027510

## 2009-05-29 MED ADMIN — pantoprazole (PROTONIX) tablet 40 mg: ORAL | @ 10:00:00 | NDC 00008084181

## 2009-05-29 MED ADMIN — rifaximin (XIFAXAN) tablet 400 mg: ORAL | @ 02:00:00 | NDC 65649030105

## 2009-05-29 MED ADMIN — potassium chloride SR (KLOR-CON 10) tablet 40 mEq: ORAL | @ 17:00:00 | NDC 00074780419

## 2009-05-29 MED FILL — METOPROLOL TARTRATE 25 MG TAB: 25 mg | ORAL | Qty: 1

## 2009-05-29 MED FILL — K-TAB 10 MEQ TABLET,EXTENDED RELEASE: 10 mEq | ORAL | Qty: 1

## 2009-05-29 MED FILL — MAGNESIUM OXIDE 400 MG TAB: 400 mg | ORAL | Qty: 1

## 2009-05-29 MED FILL — LACTULOSE 10 GRAM/15 ML ORAL SOLN: 10 gram/15 mL | ORAL | Qty: 30

## 2009-05-29 MED FILL — XIFAXAN 200 MG TABLET: 200 mg | ORAL | Qty: 2

## 2009-05-29 MED FILL — ASPIRIN 81 MG CHEWABLE TAB: 81 mg | ORAL | Qty: 1

## 2009-05-29 MED FILL — HEPARIN LOCK FLUSH (PORCINE) (PF) 100 UNIT/ML INTRAVENOUS SYRINGE: 100 unit/mL | INTRAVENOUS | Qty: 5

## 2009-05-29 MED FILL — SERTRALINE 50 MG TAB: 50 mg | ORAL | Qty: 2

## 2009-05-29 MED FILL — PROTONIX 40 MG TABLET,DELAYED RELEASE: 40 mg | ORAL | Qty: 1

## 2009-05-29 MED FILL — SALINE FLUSH INJECTION SYRINGE: INTRAMUSCULAR | Qty: 20

## 2009-05-29 MED FILL — K-TAB 10 MEQ TABLET,EXTENDED RELEASE: 10 mEq | ORAL | Qty: 4

## 2009-05-30 LAB — AMMONIA: Ammonia, plasma: 56 umol/L — ABNORMAL HIGH (ref <32)

## 2009-05-30 LAB — MAGNESIUM: Magnesium: 1.5 MG/DL — ABNORMAL LOW (ref 1.6–2.4)

## 2009-05-30 LAB — POTASSIUM: Potassium: 3.6 MMOL/L (ref 3.5–5.1)

## 2009-05-30 MED ADMIN — sodium chloride (NS) flush 10 mL: @ 23:00:00 | NDC 87701099893

## 2009-05-30 MED ADMIN — lactulose (CHRONULAC) solution 10 g: ORAL | @ 14:00:00 | NDC 00121457730

## 2009-05-30 MED ADMIN — sertraline (ZOLOFT) tablet 100 mg: ORAL | @ 14:00:00 | NDC 68084018111

## 2009-05-30 MED ADMIN — lactulose (CHRONULAC) solution 10 g: ORAL | @ 20:00:00 | NDC 00121457730

## 2009-05-30 MED ADMIN — dextrose 5 % - 0.45% NaCl infusion: INTRAVENOUS | @ 20:00:00 | NDC 00338008504

## 2009-05-30 MED ADMIN — lorazepam (ATIVAN) tablet 0.5 mg: ORAL | @ 20:00:00 | NDC 63739015410

## 2009-05-30 MED ADMIN — heparin (porcine) pf 300 Units: @ 23:00:00

## 2009-05-30 MED ADMIN — heparin (porcine) pf 300 Units: @ 14:00:00

## 2009-05-30 MED ADMIN — rifaximin (XIFAXAN) tablet 400 mg: ORAL | @ 03:00:00 | NDC 65649030105

## 2009-05-30 MED ADMIN — sodium chloride (NS) flush 10 mL: @ 19:00:00 | NDC 87701099893

## 2009-05-30 MED ADMIN — potassium chloride SR (KLOR-CON 10) tablet 10 mEq: ORAL | @ 14:00:00 | NDC 00074780419

## 2009-05-30 MED ADMIN — tramadol (ULTRAM) tablet 50 mg: ORAL | @ 03:00:00 | NDC 51079099101

## 2009-05-30 MED ADMIN — rifaximin (XIFAXAN) tablet 400 mg: ORAL | @ 20:00:00 | NDC 65649030105

## 2009-05-30 MED ADMIN — rifaximin (XIFAXAN) tablet 400 mg: ORAL | @ 14:00:00 | NDC 65649030105

## 2009-05-30 MED ADMIN — pantoprazole (PROTONIX) tablet 40 mg: ORAL | @ 11:00:00 | NDC 82009001190

## 2009-05-30 MED ADMIN — aspirin chewable tablet 81 mg: ORAL | @ 14:00:00

## 2009-05-30 MED ADMIN — heparin (porcine) pf 300 Units: @ 09:00:00

## 2009-05-30 MED ADMIN — magnesium sulfate 1 g/100 ml IVPB: INTRAVENOUS | @ 19:00:00 | NDC 00409672723

## 2009-05-30 MED ADMIN — metoprolol (LOPRESSOR) tablet 25 mg: ORAL | @ 03:00:00 | NDC 51079025501

## 2009-05-30 MED ADMIN — magnesium oxide (MAG-OX) tablet 400 mg: ORAL | @ 14:00:00 | NDC 63739035410

## 2009-05-30 MED ADMIN — nitroglycerin (NITROSTAT) tablet 0.4 mg: SUBLINGUAL | @ 19:00:00 | NDC 00071041813

## 2009-05-30 MED ADMIN — pantoprazole (PROTONIX) tablet 40 mg: ORAL | @ 09:00:00 | NDC 00008084181

## 2009-05-30 MED FILL — HEPARIN LOCK FLUSH (PORCINE) (PF) 100 UNIT/ML INTRAVENOUS SYRINGE: 100 unit/mL | INTRAVENOUS | Qty: 5

## 2009-05-30 MED FILL — SERTRALINE 50 MG TAB: 50 mg | ORAL | Qty: 2

## 2009-05-30 MED FILL — PROTONIX 40 MG TABLET,DELAYED RELEASE: 40 mg | ORAL | Qty: 1

## 2009-05-30 MED FILL — XIFAXAN 200 MG TABLET: 200 mg | ORAL | Qty: 2

## 2009-05-30 MED FILL — SALINE FLUSH INJECTION SYRINGE: INTRAMUSCULAR | Qty: 10

## 2009-05-30 MED FILL — TRAMADOL 50 MG TAB: 50 mg | ORAL | Qty: 1

## 2009-05-30 MED FILL — NITROSTAT 0.4 MG SUBLINGUAL TABLET: 0.4 mg | SUBLINGUAL | Qty: 1

## 2009-05-30 MED FILL — MAGNESIUM SULFATE IN D5W 1 GRAM/100 ML IV PIGGY BACK: 1 gram/00 mL | INTRAVENOUS | Qty: 100

## 2009-05-30 MED FILL — LACTULOSE 10 GRAM/15 ML ORAL SOLN: 10 gram/15 mL | ORAL | Qty: 30

## 2009-05-30 MED FILL — SALINE FLUSH INJECTION SYRINGE: INTRAMUSCULAR | Qty: 20

## 2009-05-30 MED FILL — MAGNESIUM OXIDE 400 MG TAB: 400 mg | ORAL | Qty: 1

## 2009-05-30 MED FILL — METOPROLOL TARTRATE 25 MG TAB: 25 mg | ORAL | Qty: 1

## 2009-05-30 MED FILL — LORAZEPAM 0.5 MG TAB: 0.5 mg | ORAL | Qty: 1

## 2009-05-30 MED FILL — DEXTROSE 5%-1/2 NORMAL SALINE IV: INTRAVENOUS | Qty: 1000

## 2009-05-30 MED FILL — ASPIRIN 81 MG CHEWABLE TAB: 81 mg | ORAL | Qty: 1

## 2009-05-30 MED FILL — K-TAB 10 MEQ TABLET,EXTENDED RELEASE: 10 mEq | ORAL | Qty: 1

## 2009-05-31 LAB — CULTURE, FUNGUS, DERMAL: Culture result:: NO GROWTH

## 2009-05-31 MED ADMIN — heparin (porcine) 100 unit/mL injection 300 Units: @ 13:00:00 | NDC 00641243645

## 2009-05-31 MED ADMIN — heparin (porcine) pf 300 Units: @ 13:00:00

## 2009-05-31 MED ADMIN — pantoprazole (PROTONIX) tablet 40 mg: ORAL | @ 10:00:00 | NDC 00008084181

## 2009-05-31 MED ADMIN — lactulose (CHRONULAC) solution 10 g: ORAL | @ 22:00:00 | NDC 00121457730

## 2009-05-31 MED ADMIN — metoprolol (LOPRESSOR) tablet 25 mg: ORAL | @ 02:00:00 | NDC 51079025501

## 2009-05-31 MED ADMIN — tramadol (ULTRAM) tablet 50 mg: ORAL | @ 19:00:00 | NDC 51079099120

## 2009-05-31 MED ADMIN — potassium chloride SR (KLOR-CON 10) tablet 10 mEq: ORAL | @ 13:00:00 | NDC 00074780419

## 2009-05-31 MED ADMIN — rifaximin (XIFAXAN) tablet 400 mg: ORAL | @ 22:00:00 | NDC 65649030105

## 2009-05-31 MED ADMIN — lorazepam (ATIVAN) tablet 0.25 mg: ORAL | @ 19:00:00 | NDC 51079041720

## 2009-05-31 MED ADMIN — lactulose (CHRONULAC) solution 10 g: ORAL | @ 13:00:00 | NDC 00121457730

## 2009-05-31 MED ADMIN — rifaximin (XIFAXAN) tablet 400 mg: ORAL | @ 02:00:00 | NDC 65649030105

## 2009-05-31 MED ADMIN — sertraline (ZOLOFT) tablet 100 mg: ORAL | @ 13:00:00 | NDC 59762490003

## 2009-05-31 MED ADMIN — rifaximin (XIFAXAN) tablet 400 mg: ORAL | @ 13:00:00 | NDC 65649030105

## 2009-05-31 MED ADMIN — quetiapine (SEROQUEL) tablet 25 mg: ORAL | @ 02:00:00 | NDC 00310027510

## 2009-05-31 MED ADMIN — aspirin chewable tablet 81 mg: ORAL | @ 13:00:00 | NDC 63739043403

## 2009-05-31 MED ADMIN — magnesium oxide (MAG-OX) tablet 400 mg: ORAL | @ 13:00:00 | NDC 63739035410

## 2009-05-31 MED FILL — XIFAXAN 200 MG TABLET: 200 mg | ORAL | Qty: 2

## 2009-05-31 MED FILL — LACTULOSE 10 GRAM/15 ML ORAL SOLN: 10 gram/15 mL | ORAL | Qty: 30

## 2009-05-31 MED FILL — TRAMADOL 50 MG TAB: 50 mg | ORAL | Qty: 1

## 2009-05-31 MED FILL — PROTONIX 40 MG TABLET,DELAYED RELEASE: 40 mg | ORAL | Qty: 1

## 2009-05-31 MED FILL — LORAZEPAM 0.5 MG TAB: 0.5 mg | ORAL | Qty: 1

## 2009-05-31 MED FILL — K-TAB 10 MEQ TABLET,EXTENDED RELEASE: 10 mEq | ORAL | Qty: 1

## 2009-05-31 MED FILL — MAGNESIUM OXIDE 400 MG TAB: 400 mg | ORAL | Qty: 1

## 2009-05-31 MED FILL — SEROQUEL 25 MG TABLET: 25 mg | ORAL | Qty: 1

## 2009-05-31 MED FILL — METOPROLOL TARTRATE 25 MG TAB: 25 mg | ORAL | Qty: 1

## 2009-05-31 MED FILL — HEPARIN LOCK FLUSH (PORCINE) (PF) 100 UNIT/ML INTRAVENOUS SYRINGE: 100 unit/mL | INTRAVENOUS | Qty: 10

## 2009-05-31 MED FILL — ASPIRIN 81 MG CHEWABLE TAB: 81 mg | ORAL | Qty: 1

## 2009-05-31 MED FILL — SERTRALINE 50 MG TAB: 50 mg | ORAL | Qty: 2

## 2009-06-01 MED ADMIN — potassium chloride SR (KLOR-CON 10) tablet 10 mEq: ORAL | @ 15:00:00 | NDC 00074780419

## 2009-06-01 MED ADMIN — pantoprazole (PROTONIX) tablet 40 mg: ORAL | @ 11:00:00 | NDC 00008084181

## 2009-06-01 MED ADMIN — magnesium oxide (MAG-OX) tablet 400 mg: ORAL | @ 15:00:00 | NDC 63739035410

## 2009-06-01 MED ADMIN — rifaximin (XIFAXAN) tablet 400 mg: ORAL | @ 15:00:00 | NDC 65649030105

## 2009-06-01 MED ADMIN — rifaximin (XIFAXAN) tablet 400 mg: ORAL | @ 02:00:00 | NDC 65649030105

## 2009-06-01 MED ADMIN — lactulose (CHRONULAC) solution 10 g: ORAL | @ 15:00:00 | NDC 00121457730

## 2009-06-01 MED ADMIN — lorazepam (ATIVAN) tablet 0.25 mg: ORAL | @ 02:00:00 | NDC 63739015410

## 2009-06-01 MED ADMIN — tramadol (ULTRAM) tablet 50 mg: ORAL | @ 05:00:00 | NDC 51079099101

## 2009-06-01 MED ADMIN — lactulose (CHRONULAC) solution 10 g: ORAL | @ 21:00:00 | NDC 00121457730

## 2009-06-01 MED ADMIN — sertraline (ZOLOFT) tablet 100 mg: ORAL | @ 15:00:00 | NDC 68084018111

## 2009-06-01 MED ADMIN — quetiapine (SEROQUEL) tablet 25 mg: ORAL | @ 05:00:00 | NDC 00310027510

## 2009-06-01 MED ADMIN — aspirin chewable tablet 81 mg: ORAL | @ 15:00:00

## 2009-06-01 MED ADMIN — heparin (porcine) pf 300 Units: @ 15:00:00

## 2009-06-01 MED ADMIN — rifaximin (XIFAXAN) tablet 400 mg: ORAL | @ 21:00:00 | NDC 65649030105

## 2009-06-01 MED ADMIN — metoprolol (LOPRESSOR) tablet 25 mg: ORAL | @ 02:00:00 | NDC 51079025501

## 2009-06-01 MED FILL — HEPARIN LOCK FLUSH (PORCINE) (PF) 100 UNIT/ML INTRAVENOUS SYRINGE: 100 unit/mL | INTRAVENOUS | Qty: 5

## 2009-06-01 MED FILL — XIFAXAN 200 MG TABLET: 200 mg | ORAL | Qty: 2

## 2009-06-01 MED FILL — METOPROLOL TARTRATE 25 MG TAB: 25 mg | ORAL | Qty: 1

## 2009-06-01 MED FILL — LACTULOSE 10 GRAM/15 ML ORAL SOLN: 10 gram/15 mL | ORAL | Qty: 30

## 2009-06-01 MED FILL — PROTONIX 40 MG TABLET,DELAYED RELEASE: 40 mg | ORAL | Qty: 1

## 2009-06-01 MED FILL — SERTRALINE 50 MG TAB: 50 mg | ORAL | Qty: 2

## 2009-06-01 MED FILL — TRAMADOL 50 MG TAB: 50 mg | ORAL | Qty: 1

## 2009-06-01 MED FILL — LORAZEPAM 0.5 MG TAB: 0.5 mg | ORAL | Qty: 1

## 2009-06-01 MED FILL — K-TAB 10 MEQ TABLET,EXTENDED RELEASE: 10 mEq | ORAL | Qty: 1

## 2009-06-01 MED FILL — SEROQUEL 25 MG TABLET: 25 mg | ORAL | Qty: 1

## 2009-06-01 MED FILL — MAGNESIUM OXIDE 400 MG TAB: 400 mg | ORAL | Qty: 1

## 2009-06-01 MED FILL — ASPIRIN 81 MG CHEWABLE TAB: 81 mg | ORAL | Qty: 1

## 2009-06-02 LAB — METABOLIC PANEL, COMPREHENSIVE
A-G Ratio: 0.6 — ABNORMAL LOW (ref 1.1–2.2)
ALT (SGPT): 32 U/L (ref 12–78)
AST (SGOT): 43 U/L — ABNORMAL HIGH (ref 15–37)
Albumin: 2.4 g/dL — ABNORMAL LOW (ref 3.5–5.0)
Alk. phosphatase: 127 U/L (ref 50–136)
Anion gap: 8 mmol/L (ref 5–15)
BUN/Creatinine ratio: 15 (ref 12–20)
BUN: 9 MG/DL (ref 6–20)
Bilirubin, total: 1.4 MG/DL — ABNORMAL HIGH (ref 0.2–1.0)
CO2: 25 MMOL/L (ref 21–32)
Calcium: 9.1 MG/DL (ref 8.5–10.1)
Chloride: 107 MMOL/L (ref 97–108)
Creatinine: 0.6 MG/DL (ref 0.6–1.3)
GFR est AA: >60 mL/min/{1.73_m2} (ref >60)
GFR est non-AA: >60 mL/min/{1.73_m2} (ref >60)
Globulin: 3.8 g/dL (ref 2.0–4.0)
Glucose: 92 MG/DL (ref 65–100)
Potassium: 3.3 MMOL/L — ABNORMAL LOW (ref 3.5–5.1)
Protein, total: 6.2 g/dL — ABNORMAL LOW (ref 6.4–8.2)
Sodium: 140 MMOL/L (ref 136–145)

## 2009-06-02 LAB — CBC W/O DIFF
HCT: 26.6 % — ABNORMAL LOW (ref 35.0–47.0)
HGB: 8.2 g/dL — ABNORMAL LOW (ref 11.5–16.0)
MCH: 27.1 PG (ref 26.0–34.0)
MCHC: 30.8 g/dL (ref 30.0–36.5)
MCV: 87.8 FL (ref 80.0–99.0)
PLATELET: 69 10*3/uL — ABNORMAL LOW (ref 150–400)
RBC: 3.03 M/uL — ABNORMAL LOW (ref 3.80–5.20)
RDW: 17.5 % — ABNORMAL HIGH (ref 11.5–14.5)
WBC: 4.3 10*3/uL (ref 3.6–11.0)

## 2009-06-02 LAB — MAGNESIUM: Magnesium: 1.6 MG/DL (ref 1.6–2.4)

## 2009-06-02 LAB — AMMONIA: Ammonia, plasma: 44 umol/L — ABNORMAL HIGH (ref <32)

## 2009-06-02 MED ADMIN — lorazepam (ATIVAN) tablet 0.25 mg: ORAL | @ 01:00:00 | NDC 63739015410

## 2009-06-02 MED ADMIN — potassium chloride SR (KLOR-CON 10) tablet 10 mEq: ORAL | @ 19:00:00 | NDC 00074780419

## 2009-06-02 MED ADMIN — quetiapine (SEROQUEL) tablet 25 mg: ORAL | @ 03:00:00 | NDC 00310027510

## 2009-06-02 MED ADMIN — rifaximin (XIFAXAN) tablet 400 mg: ORAL | @ 01:00:00 | NDC 65649030105

## 2009-06-02 MED ADMIN — heparin (porcine) pf 300 Units: @ 15:00:00

## 2009-06-02 MED ADMIN — magnesium oxide (MAG-OX) tablet 400 mg: ORAL | @ 15:00:00 | NDC 63739035410

## 2009-06-02 MED ADMIN — pantoprazole (PROTONIX) tablet 40 mg: ORAL | @ 10:00:00 | NDC 00008084181

## 2009-06-02 MED ADMIN — rifaximin (XIFAXAN) tablet 400 mg: ORAL | @ 15:00:00 | NDC 65649030105

## 2009-06-02 MED ADMIN — lactulose (CHRONULAC) solution 10 g: ORAL | @ 21:00:00 | NDC 00121457730

## 2009-06-02 MED ADMIN — tramadol (ULTRAM) tablet 50 mg: ORAL | @ 03:00:00 | NDC 51079099101

## 2009-06-02 MED ADMIN — sertraline (ZOLOFT) tablet 100 mg: ORAL | @ 15:00:00 | NDC 68084018111

## 2009-06-02 MED ADMIN — lactulose (CHRONULAC) solution 10 g: ORAL | @ 15:00:00 | NDC 00121457730

## 2009-06-02 MED ADMIN — metoprolol (LOPRESSOR) tablet 12.5 mg: ORAL | @ 15:00:00 | NDC 51079025501

## 2009-06-02 MED ADMIN — metoprolol (LOPRESSOR) tablet 12.5 mg: ORAL | @ 02:00:00 | NDC 51079025501

## 2009-06-02 MED ADMIN — aspirin chewable tablet 81 mg: ORAL | @ 15:00:00

## 2009-06-02 MED ADMIN — potassium chloride SR (KLOR-CON 10) tablet 10 mEq: ORAL | @ 21:00:00 | NDC 00074780419

## 2009-06-02 MED ADMIN — rifaximin (XIFAXAN) tablet 400 mg: ORAL | @ 21:00:00 | NDC 65649030105

## 2009-06-02 MED FILL — K-TAB 10 MEQ TABLET,EXTENDED RELEASE: 10 mEq | ORAL | Qty: 1

## 2009-06-02 MED FILL — XIFAXAN 200 MG TABLET: 200 mg | ORAL | Qty: 2

## 2009-06-02 MED FILL — ASPIRIN 81 MG CHEWABLE TAB: 81 mg | ORAL | Qty: 1

## 2009-06-02 MED FILL — PROTONIX 40 MG TABLET,DELAYED RELEASE: 40 mg | ORAL | Qty: 1

## 2009-06-02 MED FILL — LACTULOSE 10 GRAM/15 ML ORAL SOLN: 10 gram/15 mL | ORAL | Qty: 30

## 2009-06-02 MED FILL — TRAMADOL 50 MG TAB: 50 mg | ORAL | Qty: 1

## 2009-06-02 MED FILL — LORAZEPAM 0.5 MG TAB: 0.5 mg | ORAL | Qty: 1

## 2009-06-02 MED FILL — SERTRALINE 50 MG TAB: 50 mg | ORAL | Qty: 2

## 2009-06-02 MED FILL — METOPROLOL TARTRATE 25 MG TAB: 25 mg | ORAL | Qty: 1

## 2009-06-02 MED FILL — HEPARIN LOCK FLUSH (PORCINE) (PF) 100 UNIT/ML INTRAVENOUS SYRINGE: 100 unit/mL | INTRAVENOUS | Qty: 5

## 2009-06-02 MED FILL — MAGNESIUM OXIDE 400 MG TAB: 400 mg | ORAL | Qty: 1

## 2009-06-02 MED FILL — SEROQUEL 25 MG TABLET: 25 mg | ORAL | Qty: 1

## 2009-06-03 MED ADMIN — magnesium oxide (MAG-OX) tablet 400 mg: ORAL | @ 13:00:00 | NDC 63739035410

## 2009-06-03 MED ADMIN — lactulose (CHRONULAC) solution 10 g: ORAL | @ 13:00:00 | NDC 00121457730

## 2009-06-03 MED ADMIN — rifaximin (XIFAXAN) tablet 400 mg: ORAL | @ 13:00:00 | NDC 65649030105

## 2009-06-03 MED ADMIN — tramadol (ULTRAM) tablet 50 mg: ORAL | @ 13:00:00 | NDC 51079099101

## 2009-06-03 MED ADMIN — aspirin chewable tablet 81 mg: ORAL | @ 13:00:00

## 2009-06-03 MED ADMIN — heparin (porcine) pf 300 Units: @ 13:00:00

## 2009-06-03 MED ADMIN — sertraline (ZOLOFT) tablet 100 mg: ORAL | @ 05:00:00 | NDC 68084018111

## 2009-06-03 MED ADMIN — potassium chloride SR (KLOR-CON 10) tablet 10 mEq: ORAL | @ 13:00:00 | NDC 00074780419

## 2009-06-03 MED ADMIN — metoprolol (LOPRESSOR) tablet 12.5 mg: ORAL | @ 02:00:00 | NDC 51079025501

## 2009-06-03 MED ADMIN — quetiapine (SEROQUEL) tablet 25 mg: ORAL | @ 02:00:00 | NDC 00310027510

## 2009-06-03 MED ADMIN — pantoprazole (PROTONIX) tablet 40 mg: ORAL | @ 10:00:00 | NDC 00008084181

## 2009-06-03 MED ADMIN — rifaximin (XIFAXAN) tablet 400 mg: ORAL | @ 02:00:00 | NDC 65649030105

## 2009-06-03 MED FILL — TRAMADOL 50 MG TAB: 50 mg | ORAL | Qty: 1

## 2009-06-03 MED FILL — LACTULOSE 10 GRAM/15 ML ORAL SOLN: 10 gram/15 mL | ORAL | Qty: 30

## 2009-06-03 MED FILL — SERTRALINE 50 MG TAB: 50 mg | ORAL | Qty: 2

## 2009-06-03 MED FILL — METOPROLOL TARTRATE 25 MG TAB: 25 mg | ORAL | Qty: 1

## 2009-06-03 MED FILL — XIFAXAN 200 MG TABLET: 200 mg | ORAL | Qty: 2

## 2009-06-03 MED FILL — PROTONIX 40 MG TABLET,DELAYED RELEASE: 40 mg | ORAL | Qty: 1

## 2009-06-03 MED FILL — HEPARIN LOCK FLUSH (PORCINE) (PF) 100 UNIT/ML INTRAVENOUS SYRINGE: 100 unit/mL | INTRAVENOUS | Qty: 5

## 2009-06-03 MED FILL — MAGNESIUM OXIDE 400 MG TAB: 400 mg | ORAL | Qty: 1

## 2009-06-03 MED FILL — ASPIRIN 81 MG CHEWABLE TAB: 81 mg | ORAL | Qty: 1

## 2009-06-03 MED FILL — K-TAB 10 MEQ TABLET,EXTENDED RELEASE: 10 mEq | ORAL | Qty: 1

## 2009-06-03 MED FILL — SEROQUEL 25 MG TABLET: 25 mg | ORAL | Qty: 1

## 2009-06-10 LAB — CBC WITH AUTOMATED DIFF
ABS. BASOPHILS: 0 10*3/uL (ref 0.0–0.1)
ABS. EOSINOPHILS: 0.2 10*3/uL (ref 0.0–0.4)
ABS. LYMPHOCYTES: 1.7 10*3/uL (ref 0.8–3.5)
ABS. MONOCYTES: 0.4 10*3/uL (ref 0.0–1.0)
ABS. NEUTROPHILS: 3.6 10*3/uL (ref 1.8–8.0)
BASOPHILS: 0 % (ref 0–1)
EOSINOPHILS: 3 % (ref 0–7)
HCT: 29.6 % — ABNORMAL LOW (ref 35.0–47.0)
HGB: 9.1 g/dL — ABNORMAL LOW (ref 11.5–16.0)
LYMPHOCYTES: 29 % (ref 12–49)
MCH: 26.5 PG (ref 26.0–34.0)
MCHC: 30.7 g/dL (ref 30.0–36.5)
MCV: 86.3 FL (ref 80.0–99.0)
MONOCYTES: 7 % (ref 5–13)
NEUTROPHILS: 61 % (ref 32–75)
PLATELET: 92 10*3/uL — ABNORMAL LOW (ref 150–400)
RBC: 3.43 M/uL — ABNORMAL LOW (ref 3.80–5.20)
RDW: 17.3 % — ABNORMAL HIGH (ref 11.5–14.5)
WBC: 6 10*3/uL (ref 3.6–11.0)

## 2009-06-10 LAB — METABOLIC PANEL, COMPREHENSIVE
A-G Ratio: 0.7 — ABNORMAL LOW (ref 1.1–2.2)
ALT (SGPT): 34 U/L (ref 12–78)
AST (SGOT): 57 U/L — ABNORMAL HIGH (ref 15–37)
Albumin: 2.8 g/dL — ABNORMAL LOW (ref 3.5–5.0)
Alk. phosphatase: 228 U/L — ABNORMAL HIGH (ref 50–136)
Anion gap: 8 mmol/L (ref 5–15)
BUN/Creatinine ratio: 7 — ABNORMAL LOW (ref 12–20)
BUN: 5 MG/DL — ABNORMAL LOW (ref 6–20)
Bilirubin, total: 1.7 MG/DL — ABNORMAL HIGH (ref 0.2–1.0)
CO2: 24 MMOL/L (ref 21–32)
Calcium: 8.4 MG/DL — ABNORMAL LOW (ref 8.5–10.1)
Chloride: 107 MMOL/L (ref 97–108)
Creatinine: 0.7 MG/DL (ref 0.6–1.3)
GFR est AA: >60 mL/min/{1.73_m2} (ref >60)
GFR est non-AA: >60 mL/min/{1.73_m2} (ref >60)
Globulin: 4.2 g/dL — ABNORMAL HIGH (ref 2.0–4.0)
Glucose: 87 MG/DL (ref 65–100)
Potassium: 3.4 MMOL/L — ABNORMAL LOW (ref 3.5–5.1)
Protein, total: 7 g/dL (ref 6.4–8.2)
Sodium: 139 MMOL/L (ref 136–145)

## 2009-06-10 LAB — CK W/ CKMB & INDEX
CK - MB: 1.2 NG/ML (ref 0.5–3.6)
CK-MB Index: 4 — ABNORMAL HIGH (ref 0–2.5)
CK: 30 U/L (ref 21–215)

## 2009-06-10 LAB — POC TROPONIN-I: Troponin-I (POC): <0.04 ng/mL (ref 0.00–0.08)

## 2009-06-10 LAB — TROPONIN I: Troponin-I, Qt.: 0.04 ng/mL (ref <0.05)

## 2009-06-10 MED ORDER — OXYCODONE-ASPIRIN 4.88 MG-325 MG TAB
ORAL_TABLET | ORAL | Status: DC | PRN
Start: 2009-06-10 — End: 2010-04-23

## 2009-06-10 MED ORDER — ALBUTEROL 90 MCG/ACTUATION AEROSOL INHALER
90 mcg/actuation | RESPIRATORY_TRACT | Status: AC | PRN
Start: 2009-06-10 — End: 2010-06-05

## 2009-06-10 MED ORDER — AZITHROMYCIN 250 MG TAB
250 mg | PACK | ORAL | Status: AC
Start: 2009-06-10 — End: 2009-06-15

## 2009-06-10 MED ADMIN — 0.9% sodium chloride infusion: INTRAVENOUS | @ 16:00:00 | NDC 00409798309

## 2009-06-10 MED ADMIN — albuterol (PROVENTIL VENTOLIN) nebulizer solution 5 mg: RESPIRATORY_TRACT | @ 13:00:00 | NDC 00487950101

## 2009-06-10 MED ADMIN — morphine injection 4 mg: INTRAVENOUS | @ 17:00:00 | NDC 00409126130

## 2009-06-10 MED ADMIN — ipratropium (ATROVENT) 0.02 % nebulizer solution 0.5 mg: RESPIRATORY_TRACT | @ 13:00:00 | NDC 00487980101

## 2009-06-10 MED ADMIN — ketorolac (TORADOL) injection 15 mg: INTRAVENOUS | @ 15:00:00 | NDC 00409379501

## 2009-06-10 MED FILL — KETOROLAC TROMETHAMINE 30 MG/ML INJECTION: 30 mg/mL (1 mL) | INTRAMUSCULAR | Qty: 1

## 2009-06-10 MED FILL — MORPHINE 10 MG/ML SYRINGE: 10 mg/mL | INTRAMUSCULAR | Qty: 1

## 2009-06-10 MED FILL — ALBUTEROL SULFATE 0.083 % (0.83 MG/ML) SOLN FOR INHALATION: 2.5 mg /3 mL (0.083 %) | RESPIRATORY_TRACT | Qty: 2

## 2009-06-10 MED FILL — IPRATROPIUM BROMIDE 0.02 % SOLN FOR INHALATION: 0.02 % | RESPIRATORY_TRACT | Qty: 2.5

## 2009-06-10 MED FILL — SALINE FLUSH INJECTION SYRINGE: INTRAMUSCULAR | Qty: 20

## 2009-06-10 MED FILL — LEVAQUIN 750 MG/150 ML IN 5 % DEXTROSE INTRAVENOUS PIGGYBACK: 750 mg/150 mL | INTRAVENOUS | Qty: 150

## 2009-06-10 MED FILL — SODIUM CHLORIDE 0.9 % IV: INTRAVENOUS | Qty: 1000

## 2009-06-10 NOTE — ED Notes (Signed)
Patient reports pain medication is finally working,. Is resting quietly on stretcher, husband at bedside

## 2009-06-10 NOTE — ED Notes (Signed)
Patient moaning in pain, rolling and rocking on stretcher. Administering 15 mg toradol for pain

## 2009-06-10 NOTE — ED Notes (Signed)
Peak flow 220 prior to neb treatment

## 2009-06-10 NOTE — ED Provider Notes (Signed)
HPI Comments: HPI Comments: 48 y.o. WF presents ambulatory to ED with C/O constant, L sided CP that began today at 0230. Pt describes pain was "tightness" that prevented her from sleeping. Pt reports associated sxs of SOB, nausea, and loss of appetite secondary to nausea. Pt reports she was recently taken off of her "fluid pill." Pt reports hx of liver failure and heart failure. Pt also reports body aches, states this is a chronic condition for x2 years and is unchanged today.      Social:  reports that she quit using tobacco about 15 months ago.  She reports that she does not currently drink alcohol.    Allergies: is allergic to latex, natural rubber.    PCP: Charlies Constable, MD       There are no other sxs noted at this time. The pt has no further complaints.    Documented by Benjiman Core, ED Scribe, at 9:05 AM as dictated by Darlys Gales, MD.       Patient is a 48 y.o. female presenting with chest pain, shortness of breath, and nausea. The history is provided by the patient. No language interpreter was used.   Chest Pain (Angina)   Associated symptoms include nausea and shortness of breath.   Shortness of Breath  Associated symptoms include chest pain.   Nausea          Past Medical History   Diagnosis Date   ??? Liver disease 2009     liver failure   ??? Depression    ??? Anxiety    ??? Heart failure           Past Surgical History   Procedure Date   ??? Hx tonsillectomy 1972   ??? Hx adenoidectomy 1972   ??? Hx cesarean section 1986 and 1993   ??? Hx hysterectomy 2008   ??? Hx dilation and curettage 1999   ??? Upper gi endoscopy,biopsy 05/17/2009                 Family History   Problem Relation Age of Onset   ??? Cancer Mother    ??? Heart Disease Father           History   Social History   ??? Marital Status: Married     Spouse Name: N/A     Number of Children: N/A   ??? Years of Education: N/A   Occupational History   ??? Not on file.   Social History Main Topics   ??? Tobacco Use: Quit     Quit date: 02/28/2008   ??? Alcohol Use: No    ??? Drug Use:    ??? Sexually Active:    Other Topics Concern   ??? Not on file   Social History Narrative   ??? No narrative on file           ALLERGIES: Latex, natural rubber      Review of Systems   Constitutional: Negative.    HENT: Negative.    Eyes: Negative.    Respiratory: Positive for shortness of breath.    Cardiovascular: Positive for chest pain.   Gastrointestinal: Positive for nausea.   Genitourinary: Negative.    Musculoskeletal: Negative.    Skin: Negative.    Neurological: Negative.    All other systems reviewed and are negative.        Filed Vitals:    06/10/2009 11:32 AM 06/10/2009 12:45 PM 06/10/2009  1:00 PM 06/10/2009  1:41 PM   BP:  109/46 123/40 117/50   Pulse: 94 90 90 84   Temp:       Resp: 24 29 23 18    Height:       Weight:       SpO2: 93% 94% 92% 97%                Physical Exam   Physical Exam   Nursing note and vitals reviewed.   Constitutional: She is oriented to person, place, and time. She appears well-developed and well-nourished.   HENT:   Head: Normocephalic and atraumatic.   Eyes: Pupils are equal, round, and reactive to light.   Neck: No JVD present. No tracheal deviation present. No thyromegaly present.   Cardiovascular: Normal rate, regular rhythm and normal heart sounds. Exam reveals no gallop and no friction rub.   No murmur heard.   Pulmonary/Chest: Effort normal. No stridor. No respiratory distress. She has no wheezes. She has rales (L chest). She exhibits no tenderness.   Abdominal: Soft. She exhibits no distension and no mass. She has no rebound and no guarding.   Tenderness to palpation in RUQ. Pt exhibits hepatomegaly. Everted belly button.   Musculoskeletal: She exhibits no edema and no tenderness.   Lymphadenopathy:   She has no cervical adenopathy.   Neurological: She is alert and oriented to person, place, and time.   Skin: Skin is warm and dry. No rash noted. No erythema. No pallor.    Psychiatric: She has a normal mood and affect. Her behavior is normal. Thought content normal.     Coding    Procedures    EKG interpretation: (Preliminary)  Rhythm: normal sinus rhythm; and regular . Rate (approx.): 71; Axis: normal; P wave: normal; QRS interval: normal ; ST/T wave: depressed; in  Lead: I , III, aVL, aVF, V4; Other findings: abnormal ekg.      DISCHARGE NOTE:  12:32 PM  Vivian Neuwirth A, MD spoke with [name] about sx, dx, tx, and rx with good understanding. Care plan outlined and precautions discussed. Pt has been re-examined. Pt is feeling better, sxs resolved. There are no new complaints, changes, or physical findings. Reviewed results of lab work, radiology, and EKG with pt.  Medications given in ED and prescription medications were discussed with pt. All pt???s questions and concerns were addressed.  Pt was instructed and agrees to f/u with PCP, as well as to return to ED upon further deterioration. Pt is ready to go home.   Benjiman Core, ED Scribe, as dictated by Darlys Gales, MD    1:11 PM  Pt reports that her pain has returned. Will tx for pain and recheck Trop I to ensure that pain is not cardiac in nature. Pt states she still would like to go home.  Written by Benjiman Core, ED Scribe, at 1:11 PM as dictated by Darlys Gales, MD.

## 2009-06-10 NOTE — ED Notes (Signed)
Peak flow post neb treatment 250. Dr Joelene Millin notified. Patient is reporting chills, but afebrile at 98.8

## 2009-06-10 NOTE — ED Notes (Signed)
Patient reports waking up at 0230 with chest cramping, SOB, nausea and unable to go back to sleep. States once husband woke up he determined she should come the the ER for evaluation. Patient states recent hospitalization for fractured arm where she went into a "coma like state" and had an MI. Dr Joelene Millin was notified

## 2009-06-10 NOTE — ED Notes (Signed)
Patient reports chest pain has returned and is 7/10.  Dr. Joelene Millin verbally notified.

## 2009-06-10 NOTE — ED Notes (Signed)
Patient resting quietly on stretcher, fluids infusing without difficulty

## 2009-06-11 LAB — EKG, 12 LEAD, INITIAL
Atrial Rate: 71 {beats}/min
Calculated P Axis: 79 degrees
Calculated R Axis: 54 degrees
Calculated T Axis: 53 degrees
Diagnosis: NORMAL
P-R Interval: 136 ms
Q-T Interval: 472 ms
QRS Duration: 86 ms
QTC Calculation (Bezet): 512 ms
Ventricular Rate: 71 {beats}/min

## 2009-06-16 LAB — CULTURE, BLOOD
Culture result:: NO GROWTH
Culture result:: NO GROWTH

## 2009-09-14 LAB — AMMONIA: Ammonia, plasma: 54 umol/L — ABNORMAL HIGH (ref <32)

## 2010-04-12 NOTE — Telephone Encounter (Signed)
Left message on patient's answering machine to call for appt.

## 2010-04-23 LAB — CBC WITH AUTOMATED DIFF
ABS. BASOPHILS: 0 10*3/uL (ref 0.0–0.1)
ABS. EOSINOPHILS: 0.1 10*3/uL (ref 0.0–0.4)
ABS. LYMPHOCYTES: 1.4 10*3/uL (ref 0.8–3.5)
ABS. MONOCYTES: 0.4 10*3/uL (ref 0.0–1.0)
ABS. NEUTROPHILS: 2.6 10*3/uL (ref 1.8–8.0)
BASOPHILS: 1 % (ref 0–1)
EOSINOPHILS: 2 % (ref 0–7)
HCT: 31.1 % — ABNORMAL LOW (ref 35.0–47.0)
HGB: 9.4 g/dL — ABNORMAL LOW (ref 11.5–16.0)
LYMPHOCYTES: 31 % (ref 12–49)
MCH: 21.6 PG — ABNORMAL LOW (ref 26.0–34.0)
MCHC: 30.2 g/dL (ref 30.0–36.5)
MCV: 71.3 FL — ABNORMAL LOW (ref 80.0–99.0)
MONOCYTES: 8 % (ref 5–13)
NEUTROPHILS: 58 % (ref 32–75)
PLATELET: 69 10*3/uL — ABNORMAL LOW (ref 150–400)
RBC: 4.36 M/uL (ref 3.80–5.20)
RDW: 21.2 % — ABNORMAL HIGH (ref 11.5–14.5)
WBC: 4.5 10*3/uL (ref 3.6–11.0)

## 2010-04-23 LAB — URINALYSIS W/ REFLEX CULTURE
Bacteria: NEGATIVE /HPF
Bilirubin: NEGATIVE
Blood: NEGATIVE
Glucose: NEGATIVE MG/DL
Ketone: NEGATIVE MG/DL
Leukocyte Esterase: NEGATIVE
Nitrites: NEGATIVE
Protein: NEGATIVE MG/DL
Specific gravity: 1.013 (ref 1.003–1.030)
Urobilinogen: 1 EU/DL (ref 0.2–1.0)
pH (UA): 7 (ref 5.0–8.0)

## 2010-04-23 LAB — METABOLIC PANEL, COMPREHENSIVE
A-G Ratio: 0.9 — ABNORMAL LOW (ref 1.1–2.2)
ALT (SGPT): 33 U/L (ref 12–78)
AST (SGOT): 39 U/L — ABNORMAL HIGH (ref 15–37)
Albumin: 3 g/dL — ABNORMAL LOW (ref 3.5–5.0)
Alk. phosphatase: 240 U/L — ABNORMAL HIGH (ref 50–136)
Anion gap: 6 mmol/L (ref 5–15)
BUN/Creatinine ratio: 14 (ref 12–20)
BUN: 10 MG/DL (ref 6–20)
Bilirubin, total: 1 MG/DL (ref 0.2–1.0)
CO2: 26 MMOL/L (ref 21–32)
Calcium: 9.1 MG/DL (ref 8.5–10.1)
Chloride: 108 MMOL/L (ref 97–108)
Creatinine: 0.7 MG/DL (ref 0.6–1.3)
GFR est AA: >60 mL/min/{1.73_m2} (ref >60)
GFR est non-AA: >60 mL/min/{1.73_m2} (ref >60)
Globulin: 3.2 g/dL (ref 2.0–4.0)
Glucose: 143 MG/DL — ABNORMAL HIGH (ref 65–100)
Potassium: 4.1 MMOL/L (ref 3.5–5.1)
Protein, total: 6.2 g/dL — ABNORMAL LOW (ref 6.4–8.2)
Sodium: 140 MMOL/L (ref 136–145)

## 2010-04-23 LAB — LIPASE: Lipase: 289 U/L (ref 73–393)

## 2010-04-23 LAB — AMYLASE: Amylase: 91 U/L (ref 25–115)

## 2010-04-23 MED ORDER — MORPHINE 2 MG/ML INJECTION
2 mg/mL | INTRAMUSCULAR | Status: AC
Start: 2010-04-23 — End: 2010-04-23
  Administered 2010-04-23: 21:00:00 via INTRAVENOUS

## 2010-04-23 MED ORDER — IOVERSOL 320 MG/ML IV SOLN
320 mg iodine/mL | Freq: Once | INTRAVENOUS | Status: AC
Start: 2010-04-23 — End: 2010-04-23
  Administered 2010-04-23: via INTRAVENOUS

## 2010-04-23 MED ORDER — SODIUM CHLORIDE 0.9 % IJ SYRG
Freq: Once | INTRAMUSCULAR | Status: AC
Start: 2010-04-23 — End: 2010-04-23
  Administered 2010-04-23: via INTRAVENOUS

## 2010-04-23 MED ORDER — ONDANSETRON (PF) 4 MG/2 ML INJECTION
4 mg/2 mL | INTRAMUSCULAR | Status: AC
Start: 2010-04-23 — End: 2010-04-23
  Administered 2010-04-23: 21:00:00 via INTRAVENOUS

## 2010-04-23 MED ORDER — HYDROMORPHONE (PF) 1 MG/ML IJ SOLN
1 mg/mL | INTRAMUSCULAR | Status: AC
Start: 2010-04-23 — End: 2010-04-23
  Administered 2010-04-23: 22:00:00 via INTRAVENOUS

## 2010-04-23 MED ORDER — DIATRIZOATE MEGLUMINE & SODIUM 66 %-10 % ORAL SOLN
66-10 % | ORAL | Status: AC
Start: 2010-04-23 — End: 2010-04-23
  Administered 2010-04-23: 22:00:00 via ORAL

## 2010-04-23 MED ADMIN — sodium chloride 0.9 % bolus infusion 1,000 mL: INTRAVENOUS | @ 21:00:00 | NDC 00409798309

## 2010-04-23 MED ADMIN — 0.9% sodium chloride infusion: INTRAVENOUS | NDC 00409798309

## 2010-04-23 NOTE — ED Provider Notes (Signed)
HPI Comments: 49 y.o. female presents via EMS to the ED with a C/O constant and non-radiating L ABD pain described as "a knife in side." Pt reports a fever (99.76F), chills, nausea, and vomiting. Pt also reports burning with urination x 2 days, but has relieved. Pt states going to sleep relieves pain. Pt notes she took Advil yesterday w/n/r. Pt denies any changes in urinary or bowel habits, hematuria, vaginal discharge and bleeding, CP, or palpitations. Pt denies any Hx of an appendectomy. Pt reports her last CAT scan in Spring 2011 and last liver function test was 4 months ago.    PCP: Charlies Constable, MD  Social Hx: Lived in nursing home until March 2011 for rehabilitation, -EtOH   PMHx: Liver failure (2008), kidney stones, heart murmur, hepatitis, pancreatitis, depression, anxiety, CHF, GERD, HTN  PSHx: Tonsillectomy, Adenoidectomy, C-section, hysterectomy  ALL:  Latex, Natural Rubber    There are no other complaints, changes or physical findings at this time.   Written by Berna Bue, ED Scribe, as dictated by Odette Fraction.          The history is provided by the patient.        Past Medical History   Diagnosis Date   ??? Liver disease 2009     liver failure   ??? Depression    ??? Anxiety    ??? Heart failure    ??? Gastrointestinal disorder    ??? Hypertension           Past Surgical History   Procedure Date   ??? Hx tonsillectomy 1972   ??? Hx adenoidectomy 1972   ??? Hx cesarean section 1986 and 1993   ??? Hx hysterectomy 2008   ??? Hx dilation and curettage 1999   ??? Upper gi endoscopy,biopsy 05/17/2009                 Family History   Problem Relation Age of Onset   ??? Cancer Mother    ??? Heart Disease Father           History   Social History   ??? Marital Status: Married     Spouse Name: N/A     Number of Children: N/A   ??? Years of Education: N/A   Occupational History   ??? Not on file.   Social History Main Topics   ??? Smoking status: Former Smoker     Quit date: 02/28/2008   ??? Smokeless tobacco: Never Used    ??? Alcohol Use: No   ??? Drug Use:    ??? Sexually Active:    Other Topics Concern   ??? Not on file   Social History Narrative   ??? No narrative on file                    ALLERGIES: Latex, natural rubber      Review of Systems   Constitutional: Positive for fever and chills.   HENT: Negative.    Eyes: Negative.    Respiratory: Negative.    Cardiovascular: Negative.  Negative for chest pain and palpitations.   Gastrointestinal: Positive for nausea, vomiting and abdominal pain (L-sided). Negative for blood in stool.   Genitourinary: Positive for dysuria ( See HPI). Negative for urgency, frequency, hematuria, vaginal bleeding and vaginal discharge.   Musculoskeletal: Negative.    Skin: Negative.    Neurological: Negative.    All other systems reviewed and are negative.        Filed  Vitals:    04/23/10 1557   BP: 151/76   Pulse: 70   Temp: 98.8 ??F (37.1 ??C)   Resp: 20   Height: 5\' 2"  (1.575 m)   Weight: 135 lb (61.236 kg)   SpO2: 97%              Physical Exam   Nursing note and vitals reviewed.  Constitutional: She is oriented to person, place, and time. She appears well-developed and well-nourished. No distress.   HENT:   Head: Normocephalic and atraumatic.   Right Ear: External ear normal.   Left Ear: External ear normal.   Nose: Nose normal.   Eyes: Conjunctivae are normal. Pupils are equal, round, and reactive to light. Right eye exhibits no discharge. Left eye exhibits no discharge. No scleral icterus.   Neck: Normal range of motion. Neck supple.   Cardiovascular: Normal rate, regular rhythm, normal heart sounds and intact distal pulses.  Exam reveals no gallop and no friction rub.    No murmur heard.  Pulmonary/Chest: Effort normal and breath sounds normal. No respiratory distress. She has no wheezes. She has no rales. She exhibits no tenderness.   Abdominal: Soft. Bowel sounds are normal. She exhibits no distension and no mass. Tenderness (LUQ) is present. She has no rebound and no guarding.        No CVA tenderness    Musculoskeletal: Normal range of motion. She exhibits no edema and no tenderness.   Neurological: She is alert and oriented to person, place, and time. No cranial nerve deficit. She exhibits normal muscle tone. Coordination normal.   Skin: Skin is warm and dry. No rash noted. She is not diaphoretic. No erythema. No pallor.   Psychiatric: She has a normal mood and affect. Her behavior is normal.        MDM    Procedures    4:15PM  Answered pt's questions and discussed care plan.  Written by Berna Bue, ED Scribe, as dictated by Odette Fraction.    Pts Presentation, History, Physical Assessment findings, and available diagnostic results reviewed with Dr. Katrinka Blazing.  Lennyn Gange Dian Situ, PA  I personally saw and examined the patient.  I have reviewed and agree with the MLP's findings, including all diagnostic interpretations, and plans as written.   I was present during the key portions of separately billed procedures.    Neldon Newport, MD

## 2010-04-23 NOTE — ED Notes (Signed)
Assumed care of pt, pt arrived by EMS and assisted to stretcher and into position of comfort, pt reports LUQ abdominal pain with nausea and vomiting

## 2010-04-23 NOTE — ED Notes (Signed)
Left left without her medications, called and left message with adult person at her home #.

## 2010-04-23 NOTE — Progress Notes (Signed)
Patient's Medications   Start Taking    ONDANSETRON (ZOFRAN ODT) 4 MG DISINTEGRATING TABLET    Take 1 Tab by mouth every eight (8) hours as needed for Nausea.    OXYCODONE (OXYIR) 5 MG CAPSULE    Take 1 Cap by mouth every four (4) hours as needed.   Continue Taking    ALBUTEROL (PROVENTIL, VENTOLIN) 90 MCG/ACTUATION INHALER    Take 1-2 Puffs by inhalation every four (4) hours as needed for Wheezing for 360 days.    IPRATROPIUM-ALBUTEROL (COMBIVENT) 18-103 MCG/ACTUATION INHALER    Take 2 Puffs by inhalation every six (6) hours as needed.    LACTULOSE (CHRONULAC) 10 GRAM/15 ML SOLUTION    Take 15 mL by mouth daily for 360 days.    MULTIVITAMIN PO    Take  by mouth.    OMEPRAZOLE (PRILOSEC PO)    Take 20 mg by mouth daily.    POTASSIUM PO    Take 40 mEq by mouth.    QUETIAPINE (SEROQUEL) 25 MG TABLET    Take 25 mg by mouth nightly as needed.    RIFAXIMIN (XIFAXAN) 550 MG TABLET    Take 550 mg by mouth two (2) times a day.    SERTRALINE (ZOLOFT) 100 MG TABLET    TAKE 1 TABLET BY MOUTH EVERY DAY    SPIRONOLACTONE (ALDACTONE) 100 MG TABLET    Take 100 mg by mouth daily.    URSODIOL (ACTIGALL) 250 MG TABLET    Take 250 mg by mouth three (3) times daily.   These Medications have changed    No medications on file   Stop Taking    ASPIRIN 81 MG CHEWABLE TABLET    Take 1 Tab by mouth daily for 360 days.    METOPROLOL (LOPRESSOR) 25 MG TABLET    Take 0.5 Tabs by mouth every twelve (12) hours for 360 days.    OXYCODONE-ASPIRIN (PERCODAN) 4.5-0.38-325 MG PER TABLET    Take 1 Tab by mouth every four (4) hours as needed for Pain.    POTASSIUM CHLORIDE SR (KLOR-CON 10) 10 MEQ TABLET    Take 1 Tab by mouth daily for 360 days.    RIFAXIMIN 550 MG TAB    Take 550 mg by mouth two (2) times a day.    TRAMADOL (ULTRAM) 50 MG TABLET    Take 50 mg by mouth every six (6) hours as needed.

## 2010-04-23 NOTE — ED Notes (Signed)
Patient's Medications   Start Taking    ONDANSETRON (ZOFRAN ODT) 4 MG DISINTEGRATING TABLET    Take 1 Tab by mouth every eight (8) hours as needed for Nausea.    OXYCODONE (OXYIR) 5 MG CAPSULE    Take 1 Cap by mouth every four (4) hours as needed.   Continue Taking    ALBUTEROL (PROVENTIL, VENTOLIN) 90 MCG/ACTUATION INHALER    Take 1-2 Puffs by inhalation every four (4) hours as needed for Wheezing for 360 days.    IPRATROPIUM-ALBUTEROL (COMBIVENT) 18-103 MCG/ACTUATION INHALER    Take 2 Puffs by inhalation every six (6) hours as needed.    LACTULOSE (CHRONULAC) 10 GRAM/15 ML SOLUTION    Take 15 mL by mouth daily for 360 days.    MULTIVITAMIN PO    Take  by mouth.    OMEPRAZOLE (PRILOSEC PO)    Take 20 mg by mouth daily.    POTASSIUM PO    Take 40 mEq by mouth.    QUETIAPINE (SEROQUEL) 25 MG TABLET    Take 25 mg by mouth nightly as needed.    RIFAXIMIN (XIFAXAN) 550 MG TABLET    Take 550 mg by mouth two (2) times a day.    SERTRALINE (ZOLOFT) 100 MG TABLET    TAKE 1 TABLET BY MOUTH EVERY DAY    SPIRONOLACTONE (ALDACTONE) 100 MG TABLET    Take 100 mg by mouth daily.    URSODIOL (ACTIGALL) 250 MG TABLET    Take 250 mg by mouth three (3) times daily.   These Medications have changed    No medications on file   Stop Taking    ASPIRIN 81 MG CHEWABLE TABLET    Take 1 Tab by mouth daily for 360 days.    METOPROLOL (LOPRESSOR) 25 MG TABLET    Take 0.5 Tabs by mouth every twelve (12) hours for 360 days.    OXYCODONE-ASPIRIN (PERCODAN) 4.5-0.38-325 MG PER TABLET    Take 1 Tab by mouth every four (4) hours as needed for Pain.    POTASSIUM CHLORIDE SR (KLOR-CON 10) 10 MEQ TABLET    Take 1 Tab by mouth daily for 360 days.    RIFAXIMIN 550 MG TAB    Take 550 mg by mouth two (2) times a day.    TRAMADOL (ULTRAM) 50 MG TABLET    Take 50 mg by mouth every six (6) hours as needed.

## 2010-04-23 NOTE — ED Notes (Signed)
Pt returns from ultrasound, rolling around on stretcher, moaning loudly, reports pain in RUQ of abdomen and rates 10/10

## 2010-04-23 NOTE — ED Notes (Signed)
Pt drinking contrast, CT notified

## 2010-04-24 MED ORDER — ONDANSETRON 4 MG TAB, RAPID DISSOLVE
4 mg | ORAL_TABLET | Freq: Three times a day (TID) | ORAL | Status: DC | PRN
Start: 2010-04-24 — End: 2011-02-25

## 2010-04-24 MED ORDER — OXYCODONE 5 MG CAP
5 mg | ORAL_CAPSULE | ORAL | Status: DC | PRN
Start: 2010-04-24 — End: 2011-02-25

## 2010-05-02 MED ORDER — FUROSEMIDE 80 MG TAB
80 mg | ORAL_TABLET | ORAL | Status: AC
Start: 2010-05-02 — End: ?

## 2011-02-25 ENCOUNTER — Inpatient Hospital Stay
Admit: 2011-02-25 | Discharge: 2011-02-26 | Disposition: A | Discharge status: Home / Self Care | Payer: BLUE CROSS/BLUE SHIELD | Attending: Hospitalist | Admitting: Hospitalist

## 2011-02-25 DIAGNOSIS — K8020 Calculus of gallbladder without cholecystitis without obstruction: Secondary | ICD-10-CM

## 2011-02-25 LAB — METABOLIC PANEL, COMPREHENSIVE
A-G Ratio: 0.8 — ABNORMAL LOW (ref 1.1–2.2)
ALT (SGPT): 40 U/L (ref 12–78)
AST (SGOT): 41 U/L — ABNORMAL HIGH (ref 15–37)
Albumin: 3.2 g/dL — ABNORMAL LOW (ref 3.5–5.0)
Alk. phosphatase: 250 U/L — ABNORMAL HIGH (ref 50–136)
Anion gap: 8 mmol/L (ref 5–15)
BUN/Creatinine ratio: 13 (ref 12–20)
BUN: 10 MG/DL (ref 6–20)
Bilirubin, total: 0.9 MG/DL (ref 0.2–1.0)
CO2: 25 MMOL/L (ref 21–32)
Calcium: 8.7 MG/DL (ref 8.5–10.1)
Chloride: 108 MMOL/L (ref 97–108)
Creatinine: 0.8 MG/DL (ref 0.6–1.3)
GFR est AA: >60 mL/min/{1.73_m2} (ref >60)
GFR est non-AA: >60 mL/min/{1.73_m2} (ref >60)
Globulin: 3.8 g/dL (ref 2.0–4.0)
Glucose: 75 MG/DL (ref 65–100)
Potassium: 3.6 MMOL/L (ref 3.5–5.1)
Protein, total: 7 g/dL (ref 6.4–8.2)
Sodium: 141 MMOL/L (ref 136–145)

## 2011-02-25 LAB — CBC WITH AUTOMATED DIFF
ABS. BASOPHILS: 0 10*3/uL (ref 0.0–0.1)
ABS. EOSINOPHILS: 0.1 10*3/uL (ref 0.0–0.4)
ABS. LYMPHOCYTES: 1.6 10*3/uL (ref 0.8–3.5)
ABS. MONOCYTES: 0.4 10*3/uL (ref 0.0–1.0)
ABS. NEUTROPHILS: 3.5 10*3/uL (ref 1.8–8.0)
BASOPHILS: 1 % (ref 0–1)
EOSINOPHILS: 2 % (ref 0–7)
HCT: 40.7 % (ref 35.0–47.0)
HGB: 13.5 g/dL (ref 11.5–16.0)
LYMPHOCYTES: 29 % (ref 12–49)
MCH: 27.4 PG (ref 26.0–34.0)
MCHC: 33.2 g/dL (ref 30.0–36.5)
MCV: 82.6 FL (ref 80.0–99.0)
MONOCYTES: 8 % (ref 5–13)
NEUTROPHILS: 60 % (ref 32–75)
PLATELET: 85 10*3/uL — ABNORMAL LOW (ref 150–400)
RBC: 4.93 M/uL (ref 3.80–5.20)
RDW: 15.3 % — ABNORMAL HIGH (ref 11.5–14.5)
WBC: 5.7 10*3/uL (ref 3.6–11.0)

## 2011-02-25 LAB — URINALYSIS W/ REFLEX CULTURE
Bacteria: NEGATIVE /HPF
Bilirubin: NEGATIVE
Blood: NEGATIVE
Glucose: NEGATIVE MG/DL
Ketone: NEGATIVE MG/DL
Leukocyte Esterase: NEGATIVE
Nitrites: NEGATIVE
Protein: NEGATIVE MG/DL
Specific gravity: 1.015 (ref 1.003–1.030)
Urobilinogen: 1 EU/DL (ref 0.2–1.0)
pH (UA): 6.5 (ref 5.0–8.0)

## 2011-02-25 LAB — PROTHROMBIN TIME + INR
INR: 1.3 — ABNORMAL HIGH (ref 0.9–1.1)
Prothrombin time: 13 s — ABNORMAL HIGH (ref 9.4–11.7)

## 2011-02-25 LAB — CK W/ REFLX CKMB: CK: 86 U/L (ref 26–192)

## 2011-02-25 LAB — TROPONIN I: Troponin-I, Qt.: <0.04 ng/mL (ref <0.05)

## 2011-02-25 LAB — LIPASE: Lipase: 174 U/L (ref 73–393)

## 2011-02-25 MED ORDER — MORPHINE 2 MG/ML INJECTION
2 mg/mL | INTRAMUSCULAR | Status: AC
Start: 2011-02-25 — End: 2011-02-25
  Administered 2011-02-25: 22:00:00 via INTRAVENOUS

## 2011-02-25 MED ORDER — IOVERSOL 350 MG/ML IV SOLN
350 mg iodine/mL | Freq: Once | INTRAVENOUS | Status: AC
Start: 2011-02-25 — End: 2011-02-25
  Administered 2011-02-26: via INTRAVENOUS

## 2011-02-25 MED ORDER — ONDANSETRON (PF) 4 MG/2 ML INJECTION
4 mg/2 mL | INTRAMUSCULAR | Status: AC
Start: 2011-02-25 — End: 2011-02-25
  Administered 2011-02-25: 22:00:00 via INTRAVENOUS

## 2011-02-25 MED ORDER — SODIUM CHLORIDE 0.9 % IJ SYRG
Freq: Once | INTRAMUSCULAR | Status: AC
Start: 2011-02-25 — End: 2011-02-25
  Administered 2011-02-26: via INTRAVENOUS

## 2011-02-25 MED ORDER — DIATRIZOATE MEGLUMINE & SODIUM 66 %-10 % ORAL SOLN
66-10 % | ORAL | Status: AC
Start: 2011-02-25 — End: 2011-02-25
  Administered 2011-02-25: 22:00:00 via ORAL

## 2011-02-25 MED ADMIN — sodium chloride 0.9 % bolus infusion 1,000 mL: INTRAVENOUS | @ 22:00:00 | NDC 00409798348

## 2011-02-25 NOTE — H&P (Signed)
Hospitalist Admission Note    NAME: Breanna Lester   DOB:  March 26, 1961   MRN:  [id number]     Date/Time:  02/25/2011 11:42 PM    Patient PCP: Clover Mealy  ________________________________________________________________________   Assessment :    Plan:  Principal Problem:   *Cholelithiasis- Symptomatic, USG abdomen not consistent with cholecystitis. ER physician discussed the patient with Dr. Lucretia Roers, who recommended conservative medical management for patient. Will give pain meds and keep patient NPO, will get GI consultation for the patient.        Alcoholic cirrhosis of liver - restart Xifaxan, lasix.     Anemia of other chronic disease - monitor H&H    Depression: continue seroquel and zoloft        Code Status: full  Surrogate decision maker- husband  DVT Prophylaxis: lovenox  GI Prophylaxis: protonix       Subjective:   CHIEF COMPLAINT:  Abdominal pain"    HISTORY OF PRESENT ILLNESS:     Breanna Lester is a 50 y.o.  Caucasian female who presents with complaint noted above. Patient states that she was having pain in her left lower quadrant since last couple of days and was advised by her GI physician at [facility] to come to ER for evaluation. Patient was given pain meds which resolved the pain. Pain was 6/10 in intensity and dull. While she was in ER she had sudden sharp pain in her right upper quadrant. She had USG abd. Which showed cholelithiasis without evidence of cholecystitis. Patient was given pain meds following which her pain has improved.   We were asked to admit for work up and evaluation of the above problems.     Past Medical History   Diagnosis Date   ??? Liver disease 2009     liver failure   ??? Depression    ??? Anxiety    ??? Heart failure    ??? Gastrointestinal disorder    ??? Pneumonia       Past Surgical History   Procedure Date   ??? Hx tonsillectomy 1972   ??? Hx adenoidectomy 1972   ??? Hx cesarean section 1986 and 1993   ??? Hx hysterectomy 2008   ??? Hx dilation and curettage 1999    ??? Upper gi endoscopy,biopsy 05/17/2009         ??? Hx cataract removal      History   Substance Use Topics   ??? Smoking status: Former Smoker     Quit date: 02/28/2008   ??? Smokeless tobacco: Never Used   ??? Alcohol Use: No      Family History   Problem Relation Age of Onset   ??? Cancer Mother    ??? Heart Disease Father       Allergies   Allergen Reactions   ??? Latex, Natural Rubber Rash        Prior to Admission medications    Medication Sig Start Date End Date Taking? Authorizing Provider   traMADol (ULTRAM) 50 mg tablet Take 50 mg by mouth every eight (8) hours as needed.     Yes Historical Provider   furosemide (LASIX) 80 mg tablet TAKE 1 TABLET BY MOUTH EVERY DAY 05/01/10   Nurse Navigator Dma   ipratropium-albuterol (COMBIVENT) 18-103 mcg/Actuation inhaler Take 2 Puffs by inhalation every six (6) hours as needed.    Phys Other, MD   spironolactone (ALDACTONE) 100 mg tablet Take 100 mg by mouth daily.    Phys Other, MD  rifaximin (XIFAXAN) 550 mg tablet Take 550 mg by mouth two (2) times a day.    Phys Other, MD   MULTIVITAMIN PO Take  by mouth.    Phys Other, MD   quetiapine (SEROQUEL) 25 mg tablet Take 25 mg by mouth nightly as needed. 06/10/09   Phys Other, MD   sertraline (ZOLOFT) 100 mg tablet TAKE 1 TABLET BY MOUTH EVERY DAY 03/25/09   Nurse Navigator Dma     REVIEW OF SYSTEMS:    General: negative for fever, chills, sweats, weakness  Eyes: negative for blurred vision, eye pain, loss of vision, diplopia  Ear Nose and Throat: negative for rhinorrhea, pharyngitis, otalgia, tinnitus, speech or swallowing difficulties  Respiratory:  negative for cough, sputum production, SOB, wheezing, DOE, pleuritic pain  Cardiology:  negative for chest pain, palpitations, orthopnea, PND, edema, syncope   Gastrointestinal: Positive for abdominal pain,Negative for N/V, dysphagia, change in bowel habits, bleeding  Genitourinary: negative for frequency, urgency, dysuria, hematuria, incontinence   Muskuloskeletal : negative for arthralgia, myalgia  Hematology: negative for easy bruising, bleeding, lymphadenopathy  Dermatological: negative for rash, ulceration, mole change, new lesion  Endocrine: negative for hot flashes or polydipsia  Neurological: negative for headache, dizziness, confusion, focal weakness, paresthesia, memory loss, gait disturbance  Psychological: negative for anxiety, depression, agitation      Objective:   VITALS:    Visit Vitals   Item Reading   ??? BP 131/89   ??? Pulse 82   ??? Temp 98.1 ??F (36.7 ??C)   ??? Resp 18   ??? Ht 5\' 2"  (1.575 m)   ??? Wt 202 lb 9.6 oz   ??? BMI 37.06 kg/m2   ??? SpO2 97%     PHYSICAL EXAM:     GENERAL:    WD yes   WN    Cachectic    Thin    Obese    Disheveled    Ill Appearing Critically    Ill Appearing Chronically yes   Acute Distress    Other      HEENT:    NC/AT/EOMI yes   PERRLA yes   Conjunctivae Pink    Conjunctivae Pale    Moist Mucosa    Dry Mucosa    Hearing intact to voice yes   Other      NECK:    Supple yes   Masses no   Thyroid Tender no   Other                   RESPIRATORY:    CTA bilaterally WITHOUT wheezing/rhonchi/rales or crackles yes   Wheezing    Rhonchi    Crackles    Use of accessory muscles no   Other      CARDIAC:    regular rate and rhythm No murmurs/rubs/gallops yes   Murmur    Rubs    Gallops    Rate Regular/Irregular regular   Carotid Bruit Left/Right    Lower Extremity Edema no   JVP     Other      ABDOMEN:    Mild distended and  tender +bowel sounds no HSM yes   Rigid    Tenderness Mild generalis ed tenderness   Hepatomegaly    Splenomegaly    Distended    Normal/Hyper/Hypo Active Bowel Sounds    Other umblical hernia noted,      SKIN / MUSCULOSKELETAL:    Rashes no   Ecchymosis no   Ulcers no   Tight to  palpitation    Turgor Good/Poor good   Cyanosis/Clubbing    Amputation(s)    Other      NEUROLOGY:    cranial nerves II-XII grossly intact yes   Cranial Nerve Deficit    Facial Droop    Slurred Speech    Aphasia    Strength Normal yes    Weakness    Meningismus/Kernig's Sign/ Brudzinsky    Follows Commands yes   Other      PSYCHIATRIC:    AAOx3 in no acute distress yes   Insight Poor    Insight Good    Alert and Oriented to Person     Alert and Oriented to Place    Alert and Oriented toTime    Depressed no   Anxious no   Agitated no   Lethargic no   Stuporous no   Sedated no   Other      ________________________________________________________________________  Care Plan discussed with:    Comments   Patient x    Family      RN x    Care Manager                    Consultant:      ________________________________________________________________________  Recommended Disposition:   Home with Family x   HH/PT/OT/RN    SNF/LTC    [facility]    ________________________________________________________________________  Code Status:  Full Code x   DNR/DNI    ________________________________________________________________________  TOTAL TIME:     Comments   >50% of visit spent in counseling and coordination of care x      Critical Care Provided     Minutes non procedure based  ________________________________________________________________________  Juliann Mule, MD      Procedures: see electronic medical records for all procedures/Xrays and details which were not copied into this note but were reviewed prior to creation of Plan.    EKG: I have personally reviewed this ekg and findings are:  Sinus@65 /min    LAB DATA REVIEWED:    Recent Results (from the past 24 hour(s))   URINALYSIS W/ REFLEX CULTURE    Collection Time    02/25/11  5:45 PM       Component Value Range    Color YELLOW      Appearance CLEAR      Specific gravity 1.015  1.003 - 1.030 ( )    pH 6.5  5.0 - 8.0 ( )    Protein NEGATIVE   NEGATIVE (MG/DL)    Glucose NEGATIVE   NEGATIVE (MG/DL)    Ketone NEGATIVE   NEGATIVE (MG/DL)    Bilirubin NEGATIVE   NEGATIVE     Blood NEGATIVE   NEGATIVE     Urobilinogen 1.0  0.2 - 1.0 (EU/DL)    Nitrites NEGATIVE   NEGATIVE      Leukocyte Esterase NEGATIVE   NEGATIVE     WBC 0-4  0 - 4 (/HPF)    RBC 0-3  0 - 5 (/HPF)    Epithelial cells 0-5  0 - 5 (/LPF)    Bacteria NEGATIVE   NEGATIVE (/HPF)    UA:UC IF INDICATED CULTURE NOT INDICATED BY UA RESULT      Hyaline Cast 0-2  0 - 2    CBC WITH AUTOMATED DIFF    Collection Time    02/25/11  5:55 PM       Component Value Range    WBC 5.7  3.6 - 11.0 (K/uL)  RBC 4.93  3.80 - 5.20 (M/uL)    HGB 13.5  11.5 - 16.0 (g/dL)    HCT 16.1  09.6 - 04.5 (%)    MCV 82.6  80.0 - 99.0 (FL)    MCH 27.4  26.0 - 34.0 (PG)    MCHC 33.2  30.0 - 36.5 (g/dL)    RDW 40.9 (*) 81.1 - 14.5 (%)    PLATELET 85 (*) 150 - 400 (K/uL)    NEUTROPHILS 60  32 - 75 (%)    LYMPHOCYTES 29  12 - 49 (%)    MONOCYTES 8  5 - 13 (%)    EOSINOPHILS 2  0 - 7 (%)    BASOPHILS 1  0 - 1 (%)    ABS. NEUTROPHILS 3.5  1.8 - 8.0 (K/UL)    ABS. LYMPHOCYTES 1.6  0.8 - 3.5 (K/UL)    ABS. MONOCYTES 0.4  0.0 - 1.0 (K/UL)    ABS. EOSINOPHILS 0.1  0.0 - 0.4 (K/UL)    ABS. BASOPHILS 0.0  0.0 - 0.1 (K/UL)   METABOLIC PANEL, COMPREHENSIVE    Collection Time    02/25/11  5:55 PM       Component Value Range    Sodium 141  136 - 145 (MMOL/L)    Potassium 3.6  3.5 - 5.1 (MMOL/L)    Chloride 108  97 - 108 (MMOL/L)    CO2 25  21 - 32 (MMOL/L)    Anion gap 8  5 - 15 (mmol/L)    Glucose 75  65 - 100 (MG/DL)    BUN 10  6 - 20 (MG/DL)    Creatinine 0.8  0.6 - 1.3 (MG/DL)    BUN/Creatinine ratio 13  12 - 20 ( )    GFR est AA >60  >60 (ml/min/1.56m2)    GFR est non-AA >60  >60 (ml/min/1.87m2)    Calcium 8.7  8.5 - 10.1 (MG/DL)    Bilirubin, total 0.9  0.2 - 1.0 (MG/DL)    ALT 40  12 - 78 (U/L)    AST 41 (*) 15 - 37 (U/L)    Alk. phosphatase 250 (*) 50 - 136 (U/L)    Protein, total 7.0  6.4 - 8.2 (g/dL)    Albumin 3.2 (*) 3.5 - 5.0 (g/dL)    Globulin 3.8  2.0 - 4.0 (g/dL)    A-G Ratio 0.8 (*) 1.1 - 2.2 ( )   TROPONIN I    Collection Time    02/25/11  5:55 PM       Component Value Range    Troponin-I, Qt. <0.04  <0.05 (ng/mL)   CK W/ REFLX CKMB    Collection Time     02/25/11  5:55 PM       Component Value Range    CK 86  26 - 192 (U/L)   PROTHROMBIN TIME    Collection Time    02/25/11  5:55 PM       Component Value Range    INR 1.3 (*) 0.9 - 1.1 ( )    Prothrombin time 13.0 (*) 9.4 - 11.7 (sec)   LIPASE    Collection Time    02/25/11  5:55 PM       Component Value Range    Lipase 174  73 - 393 (U/L)   TSH, 3RD GENERATION    Collection Time    02/25/11  5:55 PM       Component Value Range    TSH 0.01 (*)  0.36 - 3.74 (uIU/mL)

## 2011-02-25 NOTE — ED Notes (Signed)
Assumed care of the pt.  Pt. States that since she has started drinking CT contrast her abd, cramping has increased

## 2011-02-25 NOTE — ED Notes (Signed)
Pt to xray via stretcher

## 2011-02-25 NOTE — ED Notes (Signed)
Spoke with Dr. Ermalinda Memos about her pain

## 2011-02-25 NOTE — ED Provider Notes (Signed)
HPI Comments: 50 year old female with hx of liver disease presents ambulatory to ED C/O sudden onset of constant L-sided ABD pain (7/10 intensity) x approximately 3-4 days. Pt notes that she normally has ABD pain on her R side. Pt also reports productive cough, decreased appetite, SOB, nausea, and a fever of 100.9 yesterday. Pt notes that she has no fever today. Pt also reports that her umbilical hernia has gotten larger and painful. She specifically denies any chills, vomiting, chest pain, headache, rash, diarrhea, or confusion.    PCP: Charlies Constable    PMHx significant for: liver failure, heart failure, HTN  PSHx significant for: adenoidectomy, C-section, hysterectomy, dilation and curettage    There are no other complaints, changes or physical findings at this time.     Written by Berneta Sages, ED Scribe, as dictated by Nicki Reaper, *     The history is provided by the patient.        Past Medical History   Diagnosis Date   ??? Liver disease 2009     liver failure   ??? Depression    ??? Anxiety    ??? Heart failure    ??? Gastrointestinal disorder    ??? Hypertension         Past Surgical History   Procedure Date   ??? Hx tonsillectomy 1972   ??? Hx adenoidectomy 1972   ??? Hx cesarean section 1986 and 1993   ??? Hx hysterectomy 2008   ??? Hx dilation and curettage 1999   ??? Upper gi endoscopy,biopsy 05/17/2009               Family History   Problem Relation Age of Onset   ??? Cancer Mother    ??? Heart Disease Father         History     Social History   ??? Marital Status: Married     Spouse Name: N/A     Number of Children: N/A   ??? Years of Education: N/A     Occupational History   ??? Not on file.     Social History Main Topics   ??? Smoking status: Former Smoker     Quit date: 02/28/2008   ??? Smokeless tobacco: Never Used   ??? Alcohol Use: No   ??? Drug Use:    ??? Sexually Active:      Other Topics Concern   ??? Not on file     Social History Narrative   ??? No narrative on file                  ALLERGIES: Latex, natural rubber       Review of Systems   Constitutional: Positive for fever. Negative for chills and appetite change.   HENT: Positive for congestion.    Eyes: Negative.    Respiratory: Positive for cough and shortness of breath.    Cardiovascular: Negative.  Negative for chest pain.   Gastrointestinal: Positive for nausea and abdominal pain. Negative for vomiting and diarrhea.   Genitourinary: Negative.    Musculoskeletal: Negative.    Skin: Negative.  Negative for rash.   Neurological: Negative.  Negative for headaches.   All other systems reviewed and are negative.        Filed Vitals:    02/25/11 1728   BP: 139/72   Pulse: 69   Temp: 98.1 ??F (36.7 ??C)   Resp: 18   Height: 5\' 2"  (1.575 m)   Weight: 91.9 kg (  202 lb 9.6 oz)   SpO2: 95%            Physical Exam   Nursing note and vitals reviewed.  Constitutional: She is oriented to person, place, and time. She appears well-developed and well-nourished. No distress.   HENT:   Head: Normocephalic and atraumatic.   Right Ear: Tympanic membrane and external ear normal.   Left Ear: Tympanic membrane and external ear normal.   Nose: Nose normal.   Mouth/Throat: Oropharynx is clear and moist and mucous membranes are normal. No oropharyngeal exudate.   Eyes: Conjunctivae and EOM are normal. Pupils are equal, round, and reactive to light. Right eye exhibits no discharge. Left eye exhibits no discharge. No scleral icterus.   Neck: Normal range of motion. Neck supple. No JVD present. No tracheal deviation present. No thyromegaly present.   Cardiovascular: Normal rate, regular rhythm, normal heart sounds and intact distal pulses.  Exam reveals no gallop and no friction rub.    No murmur heard.  Pulmonary/Chest: Effort normal and breath sounds normal. No stridor. No respiratory distress. She has no wheezes. She has no rales. She exhibits no tenderness.   Abdominal: Soft. Bowel sounds are normal. She exhibits no distension and no mass. There is tenderness. There is no rebound and no guarding.         Umbilical hernia, TTP supraumbilical area   Musculoskeletal: Normal range of motion. She exhibits no edema and no tenderness.   Lymphadenopathy:     She has no cervical adenopathy.   Neurological: She is alert and oriented to person, place, and time. She has normal strength and normal reflexes. No cranial nerve deficit or sensory deficit. She exhibits normal muscle tone. Coordination and gait normal.   Skin: Skin is warm and dry. No rash noted. She is not diaphoretic. No erythema. No pallor.   Psychiatric: She has a normal mood and affect.    Written by Berneta Sages, ED Scribe, as dictated by Nicki Reaper, *      MDM     Differential Diagnosis; Clinical Impression; Plan:     Hx of gallstones with biliary dyskenesia by hida in 2010, signifcant pain for 3 days worsening today. Labs ok, but still significant ttp after pain meds, not a surgical candidate at this time per surgery and GI, will start abx for possible cholecystitis and admit for pain control and further evaluation by GI.  Nicki Reaper DO      Amount and/or Complexity of Data Reviewed:   Clinical lab tests:  Ordered and reviewed  Tests in the radiology section of CPT??:  Ordered and reviewed   Obtain history from someone other than the patient:  Yes   Review and summarize past medical records:  Yes   Discuss the patient with another provider:  Yes (General Surgery - Dr. Lucretia Roers, GI - Dr. Janetta Hora)   Independant visualization of image, tracing, or specimen:  Yes  Progress:   Patient progress:  Stable and improved      Procedures    ED EKG interpretation:  Rhythm: normal sinus rhythm; and regular . Rate (approx.): 65; Axis: normal; P wave: normal; QRS interval: normal ; ST/T wave: non-specific changes;  This EKG was interpreted by Nicki Reaper, MD,ED Provider.      9:17 PM   Nicki Reaper, * re-evaluated pt and pt is reporting increased RUQ pain. There are no other complaints, changes or physical findings at this time.   Written by Berneta Sages,  ED Scribe, as dictated by Nicki Reaper, *    10:01 PM  Nicki Reaper, * spoke with Dr. Lucretia Roers (General Surgery) regarding pt's hx, status, and disposition. All available diagnostic tests and imaging results were reviewed. The care plan was outlined. Dr. Lucretia Roers agrees and advises to control pt's pain. Dr. Lucretia Roers also advises to give pt Rx pain medications and follow up with liver doctor.   Written by Berneta Sages, ED Scribe, as dictated by Nicki Reaper, *          11:01 PM  I spoke with Dr. Janetta Hora, Consult for Gastroenterology. Discussed available diagnostic tests and clinical findings. He is in agreement with care plans as outlined. He would like patient to be started on abx and admitted to hospitalist service for possible cholecystitis.  Dr Sandie Ano will consult in the am for further care.  Nicki Reaper DO          11:06 PM  I spoke with Dr. Raleigh Callas, Consult for Hospitalist. Discussed available diagnostic tests and clinical findings. He is in agreement with care plans as outlined. He will see and admit.  Nicki Reaper DO      LABORATORY TESTS:  Recent Results (from the past 12 hour(s))   URINALYSIS W/ REFLEX CULTURE    Collection Time    02/25/11  5:45 PM       Component Value Range    Color YELLOW      Appearance CLEAR      Specific gravity 1.015  1.003 - 1.030 ( )    pH 6.5  5.0 - 8.0 ( )    Protein NEGATIVE   NEGATIVE (MG/DL)    Glucose NEGATIVE   NEGATIVE (MG/DL)    Ketone NEGATIVE   NEGATIVE (MG/DL)    Bilirubin NEGATIVE   NEGATIVE     Blood NEGATIVE   NEGATIVE     Urobilinogen 1.0  0.2 - 1.0 (EU/DL)    Nitrites NEGATIVE   NEGATIVE     Leukocyte Esterase NEGATIVE   NEGATIVE     WBC 0-4  0 - 4 (/HPF)    RBC 0-3  0 - 5 (/HPF)    Epithelial cells 0-5  0 - 5 (/LPF)     Bacteria NEGATIVE   NEGATIVE (/HPF)    UA:UC IF INDICATED CULTURE NOT INDICATED BY UA RESULT      Hyaline Cast 0-2  0 - 2    CBC WITH AUTOMATED DIFF    Collection Time    02/25/11  5:55 PM       Component Value Range    WBC 5.7  3.6 - 11.0 (K/uL)    RBC 4.93  3.80 - 5.20 (M/uL)    HGB 13.5  11.5 - 16.0 (g/dL)    HCT 14.7  82.9 - 56.2 (%)    MCV 82.6  80.0 - 99.0 (FL)    MCH 27.4  26.0 - 34.0 (PG)    MCHC 33.2  30.0 - 36.5 (g/dL)    RDW 13.0 (*) 86.5 - 14.5 (%)    PLATELET 85 (*) 150 - 400 (K/uL)    NEUTROPHILS 60  32 - 75 (%)    LYMPHOCYTES 29  12 - 49 (%)    MONOCYTES 8  5 - 13 (%)    EOSINOPHILS 2  0 - 7 (%)    BASOPHILS 1  0 - 1 (%)    ABS. NEUTROPHILS 3.5  1.8 - 8.0 (K/UL)  ABS. LYMPHOCYTES 1.6  0.8 - 3.5 (K/UL)    ABS. MONOCYTES 0.4  0.0 - 1.0 (K/UL)    ABS. EOSINOPHILS 0.1  0.0 - 0.4 (K/UL)    ABS. BASOPHILS 0.0  0.0 - 0.1 (K/UL)   METABOLIC PANEL, COMPREHENSIVE    Collection Time    02/25/11  5:55 PM       Component Value Range    Sodium 141  136 - 145 (MMOL/L)    Potassium 3.6  3.5 - 5.1 (MMOL/L)    Chloride 108  97 - 108 (MMOL/L)    CO2 25  21 - 32 (MMOL/L)    Anion gap 8  5 - 15 (mmol/L)    Glucose 75  65 - 100 (MG/DL)    BUN 10  6 - 20 (MG/DL)    Creatinine 0.8  0.6 - 1.3 (MG/DL)    BUN/Creatinine ratio 13  12 - 20 ( )    GFR est AA >60  >60 (ml/min/1.41m2)    GFR est non-AA >60  >60 (ml/min/1.26m2)    Calcium 8.7  8.5 - 10.1 (MG/DL)    Bilirubin, total 0.9  0.2 - 1.0 (MG/DL)    ALT 40  12 - 78 (U/L)    AST 41 (*) 15 - 37 (U/L)    Alk. phosphatase 250 (*) 50 - 136 (U/L)    Protein, total 7.0  6.4 - 8.2 (g/dL)    Albumin 3.2 (*) 3.5 - 5.0 (g/dL)    Globulin 3.8  2.0 - 4.0 (g/dL)    A-G Ratio 0.8 (*) 1.1 - 2.2 ( )   TROPONIN I    Collection Time    02/25/11  5:55 PM       Component Value Range    Troponin-I, Qt. <0.04  <0.05 (ng/mL)   CK W/ REFLX CKMB    Collection Time    02/25/11  5:55 PM       Component Value Range    CK 86  26 - 192 (U/L)   PROTHROMBIN TIME    Collection Time    02/25/11  5:55 PM        Component Value Range    INR 1.3 (*) 0.9 - 1.1 ( )    Prothrombin time 13.0 (*) 9.4 - 11.7 (sec)   LIPASE    Collection Time    02/25/11  5:55 PM       Component Value Range    Lipase 174  73 - 393 (U/L)   TSH, 3RD GENERATION    Collection Time    02/25/11  5:55 PM       Component Value Range    TSH 0.01 (*) 0.36 - 3.74 (uIU/mL)       IMAGING RESULTS:    MEDICATIONS GIVEN:    Medications   sodium chloride (NS) 0.9 % flush (not administered)   levofloxacin (LEVAQUIN) 750 mg in D5W IVPB (not administered)   metroNIDAZOLE (FLAGYL) IVPB premix 500 mg (not administered)   HYDROmorphone (PF) (DILAUDID) injection 1 mg (not administered)   sodium chloride 0.9 % bolus infusion 1,000 mL (1000 mL IntraVENous Given 02/25/11 1738)   diatrizoate meglumine & sodium (MD-GASTROVIEW,GASTROGRAFIN) 66-10 % contrast solution 30 mL (30 mL Oral Given 02/25/11 1828)   morphine injection 4 mg (4 mg IntraVENous Given 02/25/11 1829)   ondansetron (ZOFRAN) injection 4 mg (4 mg IntraVENous Given 02/25/11 1829)   sodium chloride (NS) flush 10 mL (10 mL IntraVENous Given 02/25/11 2001)   0.9% sodium  chloride infusion (50 mL/hr IntraVENous Given 02/25/11 2001)   ioversol (OPTIRAY) 350 mg/mL contrast solution 100 mL (100 mL IntraVENous Given 02/25/11 2002)   morphine injection 4 mg (4 mg IntraVENous Given 02/25/11 2048)   HYDROmorphone (PF) (DILAUDID) injection 1 mg (1 mg IntraVENous Given 02/25/11 2216)   sodium chloride (NS) 0.9 % flush (10 mL  Given 02/25/11 2217)       IMPRESSION:  1. Abdominal pain, acute    2. Cholelithiasis        PLAN:  1. Admit    Nicki Reaper DO

## 2011-02-25 NOTE — ED Notes (Signed)
Pt started drinking contrast and CT notified

## 2011-02-25 NOTE — ED Notes (Signed)
Pt. Called her husband and he will come to pick her up.

## 2011-02-25 NOTE — ED Notes (Signed)
Pt. States that she has increased abd pain since the CT scan.

## 2011-02-25 NOTE — ED Notes (Signed)
Hospitalist at the bedside

## 2011-02-25 NOTE — ED Notes (Signed)
Pt. States that "the edge is finally off of the pain."  Sitting on the side of the stretcher laughing and eating short bread cookies and drinking Coke

## 2011-02-25 NOTE — ED Notes (Signed)
Dr. Bradshaw in talking with pt.

## 2011-02-25 NOTE — ED Notes (Signed)
Ambulates to the bathroom.  States that the cramping is better.  Finished drinking CTcontrast

## 2011-02-25 NOTE — ED Notes (Signed)
Ambulated to the bathroom

## 2011-02-26 LAB — CBC W/O DIFF
HCT: 40.6 % (ref 35.0–47.0)
HGB: 13 g/dL (ref 11.5–16.0)
MCH: 27 PG (ref 26.0–34.0)
MCHC: 32 g/dL (ref 30.0–36.5)
MCV: 84.4 FL (ref 80.0–99.0)
PLATELET: 77 10*3/uL — ABNORMAL LOW (ref 150–400)
RBC: 4.81 M/uL (ref 3.80–5.20)
RDW: 15.6 % — ABNORMAL HIGH (ref 11.5–14.5)
WBC: 5.5 10*3/uL (ref 3.6–11.0)

## 2011-02-26 LAB — EKG, 12 LEAD, INITIAL
Atrial Rate: 65 {beats}/min
Calculated P Axis: 65 degrees
Calculated R Axis: 29 degrees
Calculated T Axis: 41 degrees
Diagnosis: NORMAL
P-R Interval: 134 ms
Q-T Interval: 460 ms
QRS Duration: 88 ms
QTC Calculation (Bezet): 478 ms
Ventricular Rate: 65 {beats}/min

## 2011-02-26 LAB — METABOLIC PANEL, COMPREHENSIVE
A-G Ratio: 0.9 — ABNORMAL LOW (ref 1.1–2.2)
ALT (SGPT): 37 U/L (ref 12–78)
AST (SGOT): 35 U/L (ref 15–37)
Albumin: 2.9 g/dL — ABNORMAL LOW (ref 3.5–5.0)
Alk. phosphatase: 219 U/L — ABNORMAL HIGH (ref 50–136)
Anion gap: 4 mmol/L — ABNORMAL LOW (ref 5–15)
BUN/Creatinine ratio: 11 — ABNORMAL LOW (ref 12–20)
BUN: 8 MG/DL (ref 6–20)
Bilirubin, total: 1.4 MG/DL — ABNORMAL HIGH (ref 0.2–1.0)
CO2: 26 MMOL/L (ref 21–32)
Calcium: 8.1 MG/DL — ABNORMAL LOW (ref 8.5–10.1)
Chloride: 107 MMOL/L (ref 97–108)
Creatinine: 0.7 MG/DL (ref 0.6–1.3)
GFR est AA: >60 mL/min/{1.73_m2} (ref >60)
GFR est non-AA: >60 mL/min/{1.73_m2} (ref >60)
Globulin: 3.4 g/dL (ref 2.0–4.0)
Glucose: 105 MG/DL — ABNORMAL HIGH (ref 65–100)
Potassium: 3.3 MMOL/L — ABNORMAL LOW (ref 3.5–5.1)
Protein, total: 6.3 g/dL — ABNORMAL LOW (ref 6.4–8.2)
Sodium: 137 MMOL/L (ref 136–145)

## 2011-02-26 LAB — TSH 3RD GENERATION: TSH: 0.01 u[IU]/mL — ABNORMAL LOW (ref 0.36–3.74)

## 2011-02-26 LAB — AMMONIA: Ammonia, plasma: 52 umol/L — ABNORMAL HIGH (ref <32)

## 2011-02-26 MED ORDER — ENOXAPARIN 40 MG/0.4 ML SUB-Q SYRINGE
40 mg/0.4 mL | SUBCUTANEOUS | Status: DC
Start: 2011-02-26 — End: 2011-02-26

## 2011-02-26 MED ORDER — FUROSEMIDE 40 MG TAB
40 mg | Freq: Every day | ORAL | Status: DC
Start: 2011-02-26 — End: 2011-02-26
  Administered 2011-02-26: 14:00:00 via ORAL

## 2011-02-26 MED ORDER — NALOXONE 0.4 MG/ML INJECTION
0.4 mg/mL | INTRAMUSCULAR | Status: DC | PRN
Start: 2011-02-26 — End: 2011-02-26

## 2011-02-26 MED ORDER — DOCUSATE SODIUM 100 MG CAP
100 mg | Freq: Two times a day (BID) | ORAL | Status: DC
Start: 2011-02-26 — End: 2011-02-26
  Administered 2011-02-26: 14:00:00 via ORAL

## 2011-02-26 MED ORDER — LORAZEPAM 2 MG/ML IJ SOLN
2 mg/mL | Freq: Four times a day (QID) | INTRAMUSCULAR | Status: DC | PRN
Start: 2011-02-26 — End: 2011-02-26

## 2011-02-26 MED ORDER — ONDANSETRON (PF) 4 MG/2 ML INJECTION
4 mg/2 mL | Freq: Three times a day (TID) | INTRAMUSCULAR | Status: DC | PRN
Start: 2011-02-26 — End: 2011-02-26

## 2011-02-26 MED ORDER — RIFAXIMIN 550 MG TAB
550 mg | Freq: Two times a day (BID) | ORAL | Status: DC
Start: 2011-02-26 — End: 2011-02-26
  Administered 2011-02-26: 14:00:00 via ORAL

## 2011-02-26 MED ORDER — LEVOFLOXACIN IN D5W 750 MG/150 ML IV PIGGY BACK
750 mg/150 mL | INTRAVENOUS | Status: AC
Start: 2011-02-26 — End: 2011-02-26
  Administered 2011-02-26: 05:00:00 via INTRAVENOUS

## 2011-02-26 MED ORDER — HYDROMORPHONE (PF) 1 MG/ML IJ SOLN
1 mg/mL | INTRAMUSCULAR | Status: AC
Start: 2011-02-26 — End: 2011-02-25
  Administered 2011-02-26: 02:00:00 via INTRAVENOUS

## 2011-02-26 MED ORDER — HYDROMORPHONE (PF) 1 MG/ML IJ SOLN
1 mg/mL | INTRAMUSCULAR | Status: AC
Start: 2011-02-26 — End: 2011-02-26

## 2011-02-26 MED ORDER — METRONIDAZOLE IN SODIUM CHLORIDE (ISO-OSM) 500 MG/100 ML IV PIGGY BACK
500 mg/100 mL | Freq: Three times a day (TID) | INTRAVENOUS | Status: DC
Start: 2011-02-26 — End: 2011-02-26

## 2011-02-26 MED ORDER — METRONIDAZOLE IN SODIUM CHLORIDE (ISO-OSM) 500 MG/100 ML IV PIGGY BACK
500 mg/100 mL | INTRAVENOUS | Status: AC
Start: 2011-02-26 — End: 2011-02-26
  Administered 2011-02-26: 04:00:00 via INTRAVENOUS

## 2011-02-26 MED ORDER — ACETAMINOPHEN 325 MG TABLET
325 mg | Freq: Four times a day (QID) | ORAL | Status: DC | PRN
Start: 2011-02-26 — End: 2011-02-26

## 2011-02-26 MED ORDER — SERTRALINE 50 MG TAB
50 mg | Freq: Every evening | ORAL | Status: DC
Start: 2011-02-26 — End: 2011-02-26

## 2011-02-26 MED ORDER — DEXTROSE 5%-1/2 NORMAL SALINE IV
INTRAVENOUS | Status: DC
Start: 2011-02-26 — End: 2011-02-26
  Administered 2011-02-26: 05:00:00 via INTRAVENOUS

## 2011-02-26 MED ORDER — PANTOPRAZOLE 40 MG TAB, DELAYED RELEASE
40 mg | Freq: Every day | ORAL | Status: DC
Start: 2011-02-26 — End: 2011-02-26
  Administered 2011-02-26: 14:00:00 via ORAL

## 2011-02-26 MED ADMIN — QUEtiapine (SEROQUEL) tablet 25 mg: ORAL | @ 14:00:00 | NDC 68084053011

## 2011-02-26 MED ADMIN — morphine injection 4 mg: INTRAVENOUS | @ 01:00:00 | NDC 00409176230

## 2011-02-26 MED ADMIN — sodium chloride (NS) 0.9 % flush: @ 02:00:00 | NDC 87701099893

## 2011-02-26 MED ADMIN — 0.9% sodium chloride infusion: INTRAVENOUS | NDC 00409798309

## 2011-02-26 MED ADMIN — HYDROmorphone (PF) (DILAUDID) injection 0.5 mg: INTRAVENOUS | @ 06:00:00 | NDC 00409255201

## 2011-02-26 MED ADMIN — potassium chloride SR (KLOR-CON 10) tablet 40 mEq: ORAL | @ 15:00:00 | NDC 00245004189

## 2011-02-26 NOTE — ED Notes (Signed)
To room by transport.  Spoke with Dr. Skeet Latch for pain management orders.

## 2011-02-26 NOTE — Consults (Addendum)
GI Consult---dictated Sandie Ano for [place])    Impression:  1. RUQ pain. Suspect due to gallstones. Felt to be very high risk for cholecystectomy  2. Alcoholic cirrhosis--followed at [facility] Hepatology being considered for liver transplant  3. Hx of hepatic encephalopathy  4. Hx of ascites    Rec:  1. Agree with surgical consult  2. She wants to try clear liquids and see if can tolerate  3. Continue diuretic--spironolactone  4. Continue Xifaxan    [name] will see her Monday if not discharged before then.

## 2011-02-26 NOTE — Progress Notes (Addendum)
Pharmacy to physician communication:    Current lovenox order is 30mg  Q24 hours.  Please consider changing lovenox to 40 mg Q24h for Crcl = 31ml/min.   (dose recommendation for Crcl >49ml/min)    Thanks,  Allyson Sabal, PharmD

## 2011-02-26 NOTE — ED Notes (Signed)
Report called to [place].  Pt. To go to room 2174

## 2011-02-26 NOTE — Discharge Summary (Addendum)
Physician Discharge Summary     Patient ID:    Breanna Lester  [id number]  49 y.o.  1960/11/22    Admit date: 02/25/2011    Discharge date and time: 02/26/2011    Admission Diagnoses: cholelithiasis    Chronic Diagnoses:    Patient Active Problem List   Diagnoses Code   ??? Nausea with vomiting 787.01   ??? Hypokalemia 276.8   ??? Hepatic encephalopathy 572.2   ??? Gait abnormality 781.2   ??? Alcoholic cirrhosis of liver 571.2   ??? Hypomagnesemia 275.2   ??? Anemia of other chronic disease 285.29   ??? Unspecified thrombocytopenia 287.5   ??? Cholelithiasis 574.20   ??? Abdominal  pain, other specified site 789.09       Discharge Medications:   Current Discharge Medication List      CONTINUE these medications which have NOT CHANGED    Details   traMADol (ULTRAM) 50 mg tablet Take 50 mg by mouth every eight (8) hours as needed.        furosemide (LASIX) 80 mg tablet TAKE 1 TABLET BY MOUTH EVERY DAY  Qty: 30 Tab, Refills: 1      ipratropium-albuterol (COMBIVENT) 18-103 mcg/Actuation inhaler Take 2 Puffs by inhalation every six (6) hours as needed.      spironolactone (ALDACTONE) 100 mg tablet Take 100 mg by mouth daily.      rifaximin (XIFAXAN) 550 mg tablet Take 550 mg by mouth two (2) times a day.      MULTIVITAMIN PO Take  by mouth.      quetiapine (SEROQUEL) 25 mg tablet Take 25 mg by mouth nightly as needed.      sertraline (ZOLOFT) 100 mg tablet TAKE 1 TABLET BY MOUTH EVERY DAY  Qty: 30 Tab, Refills: 2              Follow up Care:    1. Clover Mealy in 1-2 weeks  2. Hepatologist.    Diet:  Regular Diet    Disposition:  Home.    Advanced Directive:    Discharge Exam:  Awake, alert, has some tenderness at RUQ.    CONSULTATIONS: GI    Significant Diagnostic Studies:   Recent Labs   [facility] 02/26/11 0430 02/25/11 1755    WBC 5.5 5.7    HGB 13.0 13.5    HCT 40.6 40.7    PLT 77* 85*     Recent Labs   [facility] 02/26/11 0430 02/25/11 1755    NA 137 141    K 3.3* 3.6    CL 107 108    CO2 26 25    BUN 8 10    CREA 0.7 0.8     GLU 105* 75    CA 8.1* 8.7    MG -- --    PHOS -- --    URICA -- --     Recent Labs   [facility]ename 02/26/11 0430 02/25/11 1755    SGOT 35 41*    GPT 37 40    AP 219* 250*    TBIL 1.4* 0.9    TP 6.3* 7.0    ALB 2.9* 3.2*    GLOB 3.4 3.8    GGT -- --    AML -- --    LPSE -- 174     Recent Labs   [facility] 02/25/11 1755    INR 1.3*    PTP 13.0*    APTT --      No results found  for this basename: FE:2,TIBC:2,PSAT:2,FERR:2, in the last 72 hours   No results found for this basename: PH:2,PCO2:2,PO2:2, in the last 72 hours  No results found for this basename: CPK:3,CKMB:3,TROPONINI:3, in the last 72 hours  Lab Results   Component Value Date/Time    POC GLUCOSE 112 05/25/2009  7:44 AM    POC GLUCOSE 165 05/24/2009  7:53 PM    POC GLUCOSE 103 05/24/2009  4:06 PM    POC GLUCOSE 107 05/24/2009 11:13 AM    POC GLUCOSE 92 05/24/2009  7:40 AM             HOSPITAL COURSE & TREATMENT RENDERED:   Cholelithiasis- Symptomatic, USG abdomen not consistent with cholecystitis. ER physician discussed the patient with Dr. Lucretia Roers, who recommended conservative medical management for patient. Also seen by GI and felt high risk for surgery, currently pain under control..   Alcoholic cirrhosis of liver - restart Xifaxan, lasix.   Anemia of other chronic disease -   Depression: continue seroquel and zoloft  D/w Dr. Lucretia Roers and patient. Patient wants to go home as pain is under control and no signs of infection. I recommended her to go to [facility] if pain recurs or she starts vomiting or develops fever. It appears patient understands her disease process very well and she follows at [facility]. Her potassium was 3.3 given po replacement.      Signed:  Kennyth Arnold, MD  02/26/2011  10:11 AM      Time spent : > 30 min.

## 2011-02-26 NOTE — Consults (Signed)
Name:       Breanna Lester, Breanna Lester            Admitted:          02/25/2011                                         DOB:               08-14-1961  Account #:  [id number]               Age:               50  Consultant: Drue Second., MD   Location           [phone]                                CONSULTATION REPORT    DATE OF CONSULTATION           02/26/2011      REASON FOR CONSULTATION: Abdominal pain.    REQUESTING PHYSICIAN: Juliann Mule, MD    PRIMARY GASTROENTEROLOGIST: [name], MD    DISCHARGE DIET: Abdominal pain.    HISTORY OF PRESENT ILLNESS: The patient is a 50 year old woman with a  history of alcoholic cirrhosis, currently totally abstinent from alcohol  and is followed at [facility] hepatology division for her cirrhosis. She is also  followed by [name]. She has undergone a transplant evaluation, but  due to insurance issues, is not currently listed for transplantation. She  has a history of known gallstones and has had problems with recurrent right  upper quadrant pain. 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 She now presents with a several day history of upper  abdominal pain that actually began more in the left upper quadrant and  moved to the epigastric area, and then the right upper quadrant became  quite severe. She rates it as 6/10. It was dull, did not radiate. There was  no associated nausea, vomiting, fever or chills. She came to the [facility]  emergency room where imaging studies have confirmed gallstones but no other  signs of acute cholecystitis. She is admitted for further evaluation and we  are asked to see her in consultation. She has a history of hepatic  encephalopathy and is maintained on Xifaxan. She has a history of ascites  and is maintained on furosemide and spironolactone. She uses Tramadol at  home for the abdominal pain.    PAST MEDICAL HISTORY  1. Alcoholic cirrhosis. Abstinent from alcohol at this point.  2. History of depression.   3. History of anxiety.  4. History of pneumonia.  5. History of hepatic encephalopathy and ascites.    PAST SURGICAL HISTORY: Status post tonsillectomy, adenoidectomy, status  post C-section, status post hysterectomy 2008. Last EGD in 05/10/2009 by  [name] did not show varices.    SOCIAL HISTORY: Quit smoking in 2009. No alcohol use at this time.    FAMILY HISTORY: Significant for father with heart disease.    ALLERGIES: SHE IS ALLERGIC TO LATEX WHICH CAUSES A RASH.    MEDICATIONS PRIOR TO ADMISSION  1. Ultram 50 mg q.8h. p.r.n.  2. Furosemide 80 mg daily.  3. Combivent inhaler 2 puffs every 6 hours as needed.  4. Spironolactone  100 mg daily.  5. Xifaxan 550 mg twice a day.  6. Multivitamin one daily.  7. Seroquel 25 mg at bedtime.  8. Zoloft 100 mg daily.    REVIEW OF SYSTEMS: Denies fever, chills, night sweats. No cough, shortness  of breath, wheezing, chest pain. GI per HPI. No dysuria, hematuria,  frequency. No easy bruising or bleeding. No rashes. No headache or mental  confusion, focal weakness. No anxiety or depression at this point.    PHYSICAL EXAMINATION  GENERAL: Shows her to be a pleasant woman, no acute distress.  VITAL SIGNS: Blood pressure 144/72, pulse 82, respirations 18, temperature  98.2.  HEENT: Sclerae are anicteric. Oropharynx is clear.  NECK: Supple, without JVD, thyromegaly, lymphadenopathy.  LUNGS: Clear to auscultation.  CARDIAC: Regular rate and rhythm without murmur, gallop or rub.  ABDOMEN: Obese, soft. There is mild tenderness in the right upper  quadrant.  EXTREMITIES: Show no edema.    LABORATORY DATA: WBC 5.5, hemoglobin 13.0, hematocrit 40.6, platelet count  77,000. Sodium 137, potassium 3.3, chloride 107, CO2 26, BUN 8, creatinine  0.7, bilirubin 1.4, ALT 37, AST 35, alkaline phosphatase 219, ammonia level  yesterday was 52, lipase on admission was 174. CT scan demonstrates nodular  liver consistent with her cirrhosis and gallstones, but no gallbladder wall   thickening or pericholecystic fluid. Abdominal ultrasound shows some  tenderness over the gallbladder but no wall thickening or pericholecystic  fluid. Common bile duct at 6 mm.    IMPRESSION  1. Right upper quadrant pain. I do suspect this is due to her gallstones,  but there is no direct evidence of acute cholecystitis at this point. 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  2. Alcoholic cirrhosis with alcohol abuse in remission. Followed at [facility]  hepatology, being considered for liver transplantation.  3. History of hepatic encephalopathy.  4. History of ascites.    RECOMMENDATIONS:  1. Agree with surgical consultation but I think they will recommend  conservative therapy once again.  2. Will start clear liquid diet and see if she tolerates this.  3. Continue diuretics, spironolactone and Lasix at home doses.  4. Continue Xifaxan.    [name] will assume care of this patient tomorrow morning.        Reviewed on 02/26/2011 6:52 PM              P. Wilhemina Cash., MD    cc:   Drue Second., MD        [name], MD      PFD/wmx; D: 02/26/2011  9:22 A; T: 02/26/2011 10:30 A; DOC# [id number]; Job#  [id number]

## 2011-02-26 NOTE — Progress Notes (Signed)
Walked pt to main entrance for discharge home with family.

## 2011-02-26 NOTE — ED Notes (Signed)
IV in right hand retaped.  IV infusing without any swelling.

## 2011-02-26 NOTE — Progress Notes (Signed)
DISCHARGE SUMMARY from Nurse    The following personal items collected during your admission are returned to you:   Dental Appliance: Dental Appliances: None  Vision: Visual aid: Glasses  Hearing Aid: Hearing Aid: None  Jewelry: Jewelry: Necklace;With patient;Earrings  Clothing: Clothing: Pants;Shirt;Undergarments  Other Valuables:    Valuables sent to safe: Personal Items Sent to Safe: none    3 bottles of Rx meds returned to pt: Tramadol, Zoloft and Seroquel    Current Discharge Medication List      CONTINUE these medications which have NOT CHANGED    Details   traMADol (ULTRAM) 50 mg tablet Take 50 mg by mouth every eight (8) hours as needed.        furosemide (LASIX) 80 mg tablet TAKE 1 TABLET BY MOUTH EVERY DAY  Qty: 30 Tab, Refills: 1      ipratropium-albuterol (COMBIVENT) 18-103 mcg/Actuation inhaler Take 2 Puffs by inhalation every six (6) hours as needed.      spironolactone (ALDACTONE) 100 mg tablet Take 100 mg by mouth daily.      rifaximin (XIFAXAN) 550 mg tablet Take 550 mg by mouth two (2) times a day.      MULTIVITAMIN PO Take  by mouth.      quetiapine (SEROQUEL) 25 mg tablet Take 25 mg by mouth nightly as needed.      sertraline (ZOLOFT) 100 mg tablet TAKE 1 TABLET BY MOUTH EVERY DAY  Qty: 30 Tab, Refills: 2               PATIENT INSTRUCTIONS:    After general anesthesia or intravenous sedation, for 24 hours or while taking prescription Narcotics:  ?? Limit your activities  ?? Do not drive and operate hazardous machinery  ?? Do not make important personal or business decisions  ?? Do  not drink alcoholic beverages  ?? If you have not urinated within 8 hours after discharge, please contact your surgeon on call.    Report the following to your surgeon:  ?? Excessive pain, swelling, redness or odor of or around the surgical area  ?? Temperature over 100.5  ?? Nausea and vomiting lasting longer than 4 hours or if unable to take medications   ?? Any signs of decreased circulation or nerve impairment to extremity: change in color, persistent  numbness, tingling, coldness or increase pain  ?? Any questions      Follow-up Appointments   Procedures   ??? FOLLOW UP VISIT Appointment in: One Week With PCP and if pain starts again or fever go to [facility].     Standing Status: Standing      Number of Occurrences: 1      Standing Expiration Date:      With PCP and if pain starts again or fever go to [facility].     Order Specific Question:  Appointment in     Answer:  One Week       What to do at Home:  Recommended activity: activity as tolerated,     If you experience any of the following symptomsreturn to [facility] for eval.  *  Please give a list of your current medications to your Primary Care Provider.    *  Please update this list whenever your medications are discontinued, doses are      changed, or new medications (including over-the-counter products) are added.    *  Please carry medication information at all times in case of emergency situations.  These are general instructions for a healthy lifestyle:    No smoking/ No tobacco products/ Avoid exposure to second hand smoke    Surgeon General's Warning:  Quitting smoking now greatly reduces serious risk to your health.    Obesity, smoking, and sedentary lifestyle greatly increases your risk for illness    A healthy diet, regular physical exercise & weight monitoring are important for maintaining a healthy lifestyle    You may be retaining fluid if you have a history of heart failure or if you experience any of the following symptoms:  Weight gain of 3 pounds or more overnight or 5 pounds in a week, increased swelling in our hands or feet or shortness of breath while lying flat in bed.  Please call your doctor as soon as you notice any of these symptoms; do not wait until your next office visit.    Recognize signs and symptoms of STROKE:    F-face looks uneven    A-arms unable to move or move unevenly     S-speech slurred or non-existent    T-time-call 911 as soon as signs and symptoms begin-DO NOT go       Back to bed or wait to see if you get better-TIME IS BRAIN.        The discharge information has been reviewed with the patient.  The patient verbalized understanding.

## 2014-08-14 ENCOUNTER — Inpatient Hospital Stay (HOSPITAL_COMMUNITY)
Admission: EM | Admit: 2014-08-14 | Discharge: 2014-08-17 | DRG: 287 | Disposition: A | Discharge status: Home / Self Care | Payer: BC Managed Care – PPO | Attending: Internal Medicine | Admitting: Internal Medicine

## 2014-08-14 ENCOUNTER — Emergency Department (HOSPITAL_COMMUNITY): Payer: BC Managed Care – PPO

## 2014-08-14 ENCOUNTER — Encounter (HOSPITAL_COMMUNITY): Payer: Self-pay | Admitting: *Deleted

## 2014-08-14 DIAGNOSIS — C801 Malignant (primary) neoplasm, unspecified: Secondary | ICD-10-CM | POA: Diagnosis present

## 2014-08-14 DIAGNOSIS — F419 Anxiety disorder, unspecified: Secondary | ICD-10-CM | POA: Diagnosis present

## 2014-08-14 DIAGNOSIS — Z91040 Latex allergy status: Secondary | ICD-10-CM

## 2014-08-14 DIAGNOSIS — R079 Chest pain, unspecified: Secondary | ICD-10-CM

## 2014-08-14 DIAGNOSIS — I509 Heart failure, unspecified: Secondary | ICD-10-CM | POA: Diagnosis present

## 2014-08-14 DIAGNOSIS — R9431 Abnormal electrocardiogram [ECG] [EKG]: Secondary | ICD-10-CM | POA: Diagnosis present

## 2014-08-14 DIAGNOSIS — H269 Unspecified cataract: Secondary | ICD-10-CM | POA: Diagnosis present

## 2014-08-14 DIAGNOSIS — K769 Liver disease, unspecified: Secondary | ICD-10-CM | POA: Diagnosis present

## 2014-08-14 DIAGNOSIS — D696 Thrombocytopenia, unspecified: Secondary | ICD-10-CM | POA: Diagnosis present

## 2014-08-14 DIAGNOSIS — Z87891 Personal history of nicotine dependence: Secondary | ICD-10-CM

## 2014-08-14 DIAGNOSIS — R0789 Other chest pain: Secondary | ICD-10-CM | POA: Diagnosis not present

## 2014-08-14 DIAGNOSIS — M940 Chondrocostal junction syndrome [Tietze]: Secondary | ICD-10-CM | POA: Diagnosis present

## 2014-08-14 DIAGNOSIS — Z90710 Acquired absence of both cervix and uterus: Secondary | ICD-10-CM

## 2014-08-14 DIAGNOSIS — R748 Abnormal levels of other serum enzymes: Secondary | ICD-10-CM | POA: Diagnosis present

## 2014-08-14 DIAGNOSIS — Z6841 Body Mass Index (BMI) 40.0 and over, adult: Secondary | ICD-10-CM

## 2014-08-14 DIAGNOSIS — H5441 Blindness, right eye, normal vision left eye: Secondary | ICD-10-CM | POA: Diagnosis present

## 2014-08-14 DIAGNOSIS — Z9049 Acquired absence of other specified parts of digestive tract: Secondary | ICD-10-CM | POA: Diagnosis present

## 2014-08-14 DIAGNOSIS — Z8249 Family history of ischemic heart disease and other diseases of the circulatory system: Secondary | ICD-10-CM

## 2014-08-14 HISTORY — DX: Heart failure, unspecified: I50.9

## 2014-08-14 HISTORY — DX: Acute pancreatitis without necrosis or infection, unspecified: K85.90

## 2014-08-14 HISTORY — DX: Anxiety disorder, unspecified: F41.9

## 2014-08-14 HISTORY — DX: Other chest pain: R07.89

## 2014-08-14 HISTORY — DX: Unspecified cataract: H26.9

## 2014-08-14 LAB — BASIC METABOLIC PANEL
Anion gap: 9 (ref 5–15)
BUN: 14 mg/dL (ref 6–23)
CO2: 24 mmol/L (ref 19–32)
Calcium: 8.9 mg/dL (ref 8.4–10.5)
Chloride: 104 mEq/L (ref 96–112)
Creatinine, Ser: 0.91 mg/dL (ref 0.50–1.10)
GFR calc Af Amer: 82 mL/min — ABNORMAL LOW (ref >90)
GFR calc non Af Amer: 71 mL/min — ABNORMAL LOW (ref >90)
Glucose, Bld: 94 mg/dL (ref 70–99)
Potassium: 3.9 mmol/L (ref 3.5–5.1)
Sodium: 137 mmol/L (ref 135–145)

## 2014-08-14 LAB — CBC WITH DIFFERENTIAL/PLATELET
Basophils Absolute: 0 10*3/uL (ref 0.0–0.1)
Basophils Relative: 1 % (ref 0–1)
Eosinophils Absolute: 0.3 10*3/uL (ref 0.0–0.7)
Eosinophils Relative: 3 % (ref 0–5)
HCT: 48 % — ABNORMAL HIGH (ref 36.0–46.0)
Hemoglobin: 16.2 g/dL — ABNORMAL HIGH (ref 12.0–15.0)
Lymphocytes Relative: 23 % (ref 12–46)
Lymphs Abs: 2 10*3/uL (ref 0.7–4.0)
MCH: 29.2 pg (ref 26.0–34.0)
MCHC: 33.8 g/dL (ref 30.0–36.0)
MCV: 86.6 fL (ref 78.0–100.0)
Monocytes Absolute: 0.5 10*3/uL (ref 0.1–1.0)
Monocytes Relative: 5 % (ref 3–12)
Neutro Abs: 5.8 10*3/uL (ref 1.7–7.7)
Neutrophils Relative %: 68 % (ref 43–77)
Platelets: 95 10*3/uL — ABNORMAL LOW (ref 150–400)
RBC: 5.54 MIL/uL — ABNORMAL HIGH (ref 3.87–5.11)
RDW: 13.4 % (ref 11.5–15.5)
WBC: 8.5 10*3/uL (ref 4.0–10.5)

## 2014-08-14 LAB — TROPONIN I: Troponin I: 0.22 ng/mL — ABNORMAL HIGH (ref <0.031)

## 2014-08-14 MED ORDER — ASPIRIN 81 MG PO CHEW
324.0000 mg | CHEWABLE_TABLET | Freq: Once | ORAL | Status: AC
  Administered 2014-08-15: 324 mg via ORAL
  Filled 2014-08-14: qty 4

## 2014-08-14 MED ORDER — OXYCODONE-ACETAMINOPHEN 5-325 MG PO TABS
2.0000 | ORAL_TABLET | Freq: Once | ORAL | Status: AC
  Administered 2014-08-14: 2 via ORAL
  Filled 2014-08-14: qty 2

## 2014-08-14 NOTE — ED Notes (Signed)
Pt. Started having 7/10 chest discomfort 2 hours ago and called EMS. On EMS arrival pt. Stated chest hurt worse with deep breaths and when she bends over. EMS gave 1 nitro with no change in pain and aspirin. Pt. Has hx of CHF and anxiety.

## 2014-08-14 NOTE — ED Notes (Signed)
Patient transported to X-ray 
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
## 2014-08-15 ENCOUNTER — Emergency Department (HOSPITAL_COMMUNITY): Payer: BC Managed Care – PPO

## 2014-08-15 ENCOUNTER — Encounter (HOSPITAL_COMMUNITY): Payer: Self-pay

## 2014-08-15 DIAGNOSIS — R079 Chest pain, unspecified: Secondary | ICD-10-CM | POA: Diagnosis present

## 2014-08-15 DIAGNOSIS — I517 Cardiomegaly: Secondary | ICD-10-CM

## 2014-08-15 LAB — BRAIN NATRIURETIC PEPTIDE: B NATRIURETIC PEPTIDE 5: 25.3 pg/mL (ref 0.0–100.0)

## 2014-08-15 LAB — LIPID PANEL
Cholesterol: 171 mg/dL (ref 0–200)
HDL: 57 mg/dL (ref >39)
LDL CALC: 96 mg/dL (ref 0–99)
Total CHOL/HDL Ratio: 3 RATIO
Triglycerides: 90 mg/dL (ref <150)
VLDL: 18 mg/dL (ref 0–40)

## 2014-08-15 LAB — TROPONIN I
TROPONIN I: 0.19 ng/mL — AB (ref <0.031)
TROPONIN I: 0.19 ng/mL — AB (ref <0.031)
Troponin I: 0.2 ng/mL — ABNORMAL HIGH (ref <0.031)

## 2014-08-15 LAB — HEPARIN LEVEL (UNFRACTIONATED): HEPARIN UNFRACTIONATED: 0.35 [IU]/mL (ref 0.30–0.70)

## 2014-08-15 LAB — HEMOGLOBIN A1C
Hgb A1c MFr Bld: 5.7 % — ABNORMAL HIGH (ref <5.7)
Mean Plasma Glucose: 117 mg/dL — ABNORMAL HIGH (ref <117)

## 2014-08-15 LAB — TSH: TSH: 2.832 u[IU]/mL (ref 0.350–4.500)

## 2014-08-15 MED ORDER — HEPARIN (PORCINE) IN NACL 100-0.45 UNIT/ML-% IJ SOLN
1000.0000 [IU]/h | INTRAMUSCULAR | Status: DC
  Administered 2014-08-15: 1000 [IU]/h via INTRAVENOUS
  Filled 2014-08-15 (×3): qty 250

## 2014-08-15 MED ORDER — HEPARIN BOLUS VIA INFUSION
2000.0000 [IU] | Freq: Once | INTRAVENOUS | Status: AC
  Administered 2014-08-15: 2000 [IU] via INTRAVENOUS
  Filled 2014-08-15: qty 2000

## 2014-08-15 MED ORDER — IOHEXOL 350 MG/ML SOLN
100.0000 mL | Freq: Once | INTRAVENOUS | Status: AC | PRN
  Administered 2014-08-15: 100 mL via INTRAVENOUS

## 2014-08-15 MED ORDER — KETOROLAC TROMETHAMINE 30 MG/ML IJ SOLN
30.0000 mg | Freq: Four times a day (QID) | INTRAMUSCULAR | Status: AC
  Administered 2014-08-15 – 2014-08-17 (×8): 30 mg via INTRAVENOUS
  Filled 2014-08-15 (×10): qty 1

## 2014-08-15 MED ORDER — ALPRAZOLAM 0.25 MG PO TABS: 0.2500 mg | ORAL_TABLET | Freq: Three times a day (TID) | ORAL | Status: DC | PRN

## 2014-08-15 MED ORDER — GI COCKTAIL ~~LOC~~
30.0000 mL | Freq: Once | ORAL | Status: AC
  Administered 2014-08-15: 30 mL via ORAL
  Filled 2014-08-15: qty 30

## 2014-08-15 MED ORDER — ACETAMINOPHEN 325 MG PO TABS: 650.0000 mg | ORAL_TABLET | ORAL | Status: DC | PRN

## 2014-08-15 MED ORDER — NITROGLYCERIN 0.4 MG SL SUBL
0.4000 mg | SUBLINGUAL_TABLET | SUBLINGUAL | Status: DC | PRN
  Administered 2014-08-15 (×2): 0.4 mg via SUBLINGUAL
  Filled 2014-08-15 (×2): qty 1

## 2014-08-15 MED ORDER — ONDANSETRON HCL 4 MG/2ML IJ SOLN
4.0000 mg | Freq: Four times a day (QID) | INTRAMUSCULAR | Status: DC | PRN
  Administered 2014-08-15: 4 mg via INTRAVENOUS
  Filled 2014-08-15: qty 2

## 2014-08-15 MED ORDER — ENOXAPARIN SODIUM 40 MG/0.4ML ~~LOC~~ SOLN
40.0000 mg | Freq: Every day | SUBCUTANEOUS | Status: DC
  Administered 2014-08-15: 40 mg via SUBCUTANEOUS
  Filled 2014-08-15: qty 0.4
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
## 2014-08-15 NOTE — H&P (Signed)
Chief complaint: Chest pain HPI:   This is 53 year old morbidly obese, sedentary female with past medical history of prolonged hospitalization management [place] for what sounded as respiratory failure secondary to severe ARDS???? resulting in multiorgan failure (patient claims that she had heart failure, liver failure, pancreatitis). Patient presented to the emergency department with complaints of substernal chest burning radiating over her chest and back, 12 out of 10 in intensity at the time of onset. Patient reported that she bent over to mop floors and when she extended her back she started having deep burning in her chest. At the time of my evaluation pain has subsided to some degree after one tablet of Percocet. Patient denied any current shortness of breath however reported that pain is worse with inspiration.  CT chest was obtained in the ED and was negative for pulmonary embolism however showed dilated main pulmonary artery and nonspecific mosaic pattern opacities in the lung parenchyma.  Patient denied active symptoms of shortness of breath, syncope or presyncope, lower extremity edema, PND or orthopnea, frequent or prolonged palpitations  Review of Systems:  12 systems were reviewed nad were negative except mentioned in the HPI      Past Medical History  Diagnosis Date  . CHF (congestive heart failure)   . Anxiety   . Pancreatitis   . Cancer   . Cataracts, bilateral    Past Surgical History  Procedure Laterality Date  . Abdominal hysterectomy    . Cholecystectomy       Current Medications:  Infusions:    (Not in a hospital admission)   Allergies  Allergen Reactions  . Latex Itching and Rash    History   Social History  . Marital Status: Legally Separated    Spouse Name: N/A    Number of Children: N/A  . Years of Education: N/A   Occupational History  . Not on file.   Social History Main Topics  . Smoking status: Former Smoker    Types:  Cigarettes  . Smokeless tobacco: Never Used  . Alcohol Use: No  . Drug Use: No  . Sexual Activity: Not Currently   Other Topics Concern  . Not on file   Social History Narrative  . No narrative on file   Family history: Father with myocardial infarction and heart failure in his mid 58s.  PHYSICAL EXAM: Filed Vitals:   08/15/14 0030  BP: 137/89  Pulse: 67  Temp:   Resp: 17    No intake or output data in the 24 hours ending 08/15/14 0142  General:  Well appearing. No respiratory difficulty HEENT: normal Neck: supple. no JVD. Carotids 2+ bilat; no bruits. No lymphadenopathy or thryomegaly appreciated. Cor: PMI nondisplaced. Regular rate & rhythm. No rubs, gallops or murmurs. Palpation of the parasternal joints elicits pain however patient reported that this is not the same type of pain that brought her to the hospital. Lungs: clear. Abdomen: soft, nontender, nondistended. No hepatosplenomegaly. No bruits or masses. Good bowel sounds., Obese Extremities: no cyanosis, clubbing, rash, edema Neuro: alert & oriented x 3, cranial nerves grossly intact. moves all 4 extremities w/o difficulty. Affect pleasant. Psychiatric: Anxious  Results for orders placed or performed during the hospital encounter of 08/14/14 (from the past 24 hour(s))  CBC with Differential     Status: Abnormal   Collection Time: 08/14/14 10:41 PM  Result Value Ref Range   WBC 8.5 4.0 - 10.5 K/uL   RBC 5.54 (H) 3.87 - 5.11 MIL/uL  Hemoglobin 16.2 (H) 12.0 - 15.0 g/dL   HCT 48.0 (H) 36.0 - 46.0 %   MCV 86.6 78.0 - 100.0 fL   MCH 29.2 26.0 - 34.0 pg   MCHC 33.8 30.0 - 36.0 g/dL   RDW 13.4 11.5 - 15.5 %   Platelets 95 (L) 150 - 400 K/uL   Neutrophils Relative % 68 43 - 77 %   Neutro Abs 5.8 1.7 - 7.7 K/uL   Lymphocytes Relative 23 12 - 46 %   Lymphs Abs 2.0 0.7 - 4.0 K/uL   Monocytes Relative 5 3 - 12 %   Monocytes Absolute 0.5 0.1 - 1.0 K/uL   Eosinophils Relative 3 0 - 5 %   Eosinophils Absolute 0.3  0.0 - 0.7 K/uL   Basophils Relative 1 0 - 1 %   Basophils Absolute 0.0 0.0 - 0.1 K/uL  Basic metabolic panel     Status: Abnormal   Collection Time: 08/14/14 10:41 PM  Result Value Ref Range   Sodium 137 135 - 145 mmol/L   Potassium 3.9 3.5 - 5.1 mmol/L   Chloride 104 96 - 112 mEq/L   CO2 24 19 - 32 mmol/L   Glucose, Bld 94 70 - 99 mg/dL   BUN 14 6 - 23 mg/dL   Creatinine, Ser 0.91 0.50 - 1.10 mg/dL   Calcium 8.9 8.4 - 10.5 mg/dL   GFR calc non Af Amer 71 (L) >90 mL/min   GFR calc Af Amer 82 (L) >90 mL/min   Anion gap 9 5 - 15  Troponin I     Status: Abnormal   Collection Time: 08/14/14 10:41 PM  Result Value Ref Range   Troponin I 0.22 (H) <0.031 ng/mL   Radiology:  Dg Chest 2 View  08/14/2014   CLINICAL DATA:  Acute onset of shooting bilateral chest pain anteriorly. Initial encounter.  EXAM: CHEST  2 VIEW  COMPARISON:  None.  FINDINGS: The lungs are well-aerated. Vascular congestion is noted. Increased interstitial markings may reflect mild interstitial edema. There is no evidence of pleural effusion or pneumothorax.  The heart is borderline enlarged. No acute osseous abnormalities are seen.  IMPRESSION: Vascular congestion and borderline cardiomegaly. Increased interstitial markings may reflect mild interstitial edema.   Electronically Signed   By: Garald Balding M.D.   On: 08/14/2014 22:59   CTA chest 08/15/2014: 1. No evidence of pulmonary embolus. 2. Mildly mosaic pattern of parenchymal attenuation noted bilaterally. Lungs otherwise grossly clear. 3. Dilatation of the main pulmonary outflow tract to 3.7 cm 4. Changes of hepatic cirrhosis noted.   ECG 08/14/2014: Sinus rhythm 64 bpm: Normal axis, T wave inversions in V1-V3  Bedside ultrasound: Lungs: Bilateral lung sliding, bilateral A-line pattern anteriorly, no significant pleural effusions Heart: Normal RV and LV function, no peripheral effusion, IVC is 1.7 centimeters with more than 50% variability Bilateral lower  extremity compression ultrasound: No evidence of DVT in bilateral femoral and popliteal veins on 4 point compression ultrasound  ASSESSMENT: Chest pain atypical Costochondritis   PLAN/DISCUSSION: Patient appears to have 2 different types of chest pain, one is undoubtably musculoskeletal for which she will require Tylenol and possibly NSAIDs as needed Ever she also reported deep substernal burning with radiation to her back that is worse with inspiration, troponins mildly elevated, EKG with T-wave inversions in V1-V3 Given morbid obesity and relative immobility CT chest was obtained to evaluate for pulmonary embolus, and other intrathoracic pathology. This yielded in some psych pattern in the lung parenchyma which  given the patient's history of respiratory failure in the past likely chronic. She has notable dilation of mean pulmonary artery suggestive of pulmonary hypertension. We will place in observation Repeat troponin, repeat EKG Echocardiogram in the morning   Inez Pilgrim, MD 08/15/2014 1:42 AM

## 2014-08-15 NOTE — Progress Notes (Addendum)
ANTICOAGULATION CONSULT NOTE - Initial Consult  Pharmacy Consult for Heparin Indication: chest pain/ACS  Allergies  Allergen Reactions  . Latex Itching and Rash    Patient Measurements: Height: 5\' 2"  (157.5 cm) Weight: 254 lb 11.2 oz (115.531 kg) (scale A) IBW/kg (Calculated) : 50.1 Heparin Dosing Weight: 78.5 kg  Vital Signs: Temp: 97.3 F (36.3 C) (12/26 0550) Temp Source: Oral (12/26 0550) BP: 124/53 mmHg (12/26 0951) Pulse Rate: 74 (12/26 0951)  Labs:  Recent Labs  08/14/14 2241 08/15/14 0722  HGB 16.2*  --   HCT 48.0*  --   PLT 95*  --   CREATININE 0.91  --   TROPONINI 0.22* 0.19*    Estimated Creatinine Clearance: 86.1 mL/min (by C-G formula based on Cr of 0.91).   Medical History: Past Medical History  Diagnosis Date  . CHF (congestive heart failure)   . Anxiety   . Pancreatitis   . Cancer   . Cataracts, bilateral     Medications:  Prescriptions prior to admission  Medication Sig Dispense Refill Last Dose  . Melatonin 5 MG TABS Take 5 mg by mouth at bedtime as needed (sleep).   Past Month at Unknown time   Scheduled:  . enoxaparin (LOVENOX) injection  40 mg Subcutaneous Daily  . ketorolac  30 mg Intravenous 4 times per day   Assessment: 53yo female presents with chest pain. Pharmacy is consulted to dose heparin for ACS/chest pain. Hgb is 16.2, Plt 95, sCr 0.91.  Pt received enoxaparin 40mg  one time at 0930. Will reduce heparin bolus.  Goal of Therapy:  Heparin level 0.3-0.7 units/ml Monitor platelets by anticoagulation protocol: Yes   Plan:  Give 2000 units bolus x 1 Start heparin infusion at 1000 units/hr Check anti-Xa level in 6 hours and daily while on heparin Continue to monitor H&H and platelets  Monitor s/sx of bleeding  Andrey Cota. Diona Foley, PharmD Clinical Pharmacist Pager [phone] 08/15/2014,11:10 AM

## 2014-08-15 NOTE — ED Provider Notes (Signed)
Patient initially seen and evaluated by Dr. Wilson Singer. Elevated troponin, being sent for CT angiogram. CT angiogram is come back negative. She will need to be admitted on the cardiology service. Case is discussed with Dr. Ulyses Amor of cardiology service who agrees to admit the patient.  Delora Fuel, MD 38/18/29 [id number]
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
## 2014-08-15 NOTE — Progress Notes (Signed)
  Echocardiogram 2D Echocardiogram has been performed.  Cassandra Rivera 08/15/2014, 4:06 PM

## 2014-08-15 NOTE — Progress Notes (Signed)
Subjective:  Very minimal chest discomfort this morning.  History was reviewed in detail.  She recently moved to the [place] area with her daughter, who is planning to attend school year.  She reports that her daughter is a highly functioning autistic child.  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  States that she had surgery on her right wrist for unclear reasons, perhaps a fracture within the past year, but that she was not able to get the sutures removed from her wrist.  Objective:  Vital Signs in the last 24 hours: BP 124/53 mmHg  Pulse 74  Temp(Src) 97.3 F (36.3 C) (Oral)  Resp 18  Ht 5\' 2"  (1.575 m)  Wt 115.531 kg (254 lb 11.2 oz)  BMI 46.57 kg/m2  SpO2 95%  Physical Exam: Tearful, extremely large white female currently in no acute distress Lungs:  Clear Cardiac:  Regular rhythm, normal S1 and S2, no S3 Abdomen:  Soft, nontender, no masses Extremities:  Previous sutures are present in the right wrist approximately 6 with keloid formation  Intake/Output from previous day:    Weight Filed Weights   08/14/14 2223 08/15/14 0550 08/15/14 0903  Weight: 95.255 kg (210 lb) 115.622 kg (254 lb 14.4 oz) 115.531 kg (254 lb 11.2 oz)    Lab Results: Basic Metabolic Panel:  Recent Labs  08/14/14 2241  NA 137  K 3.9  CL 104  CO2 24  GLUCOSE 94  BUN 14  CREATININE 0.91   CBC:  Recent Labs  08/14/14 2241  WBC 8.5  NEUTROABS 5.8  HGB 16.2*  HCT 48.0*  MCV 86.6  PLT 95*   Cardiac Enzymes:  Recent Labs  08/14/14 2241 08/15/14 0722  TROPONINI 0.22* 0.19*    Telemetry: Currently normal sinus rhythm  Assessment/Plan:  1.  Prolonged chest discomfort with mild elevation of troponin and mildly abnormal baseline ECG. 2.  Morbid obesity. 3.  Very complex past medical history with recent change in location  4.  Early family history of cardiovascular disease.  Recommendations:  Very  complex history.  Chest pain has atypical features but has mildly elevated troponin.  She has mild thrombocytopenia.  In addition, she points out sutures in her wrist from previous surgery that she says has been there for over one year.  I will ask the general surgeons to see if they can remove them.  At this point in time, she is not going to be a good candidate for noninvasive stress testing and likely will need cardiac catheterization.  Because of the sutures in the right wrist would probably be need to done through the left wrist.  Begin intravenous heparin and watch platelets carefully.      Kerry Hough  MD [facility]  08/15/2014, 10:47 AM

## 2014-08-15 NOTE — Progress Notes (Signed)
ANTICOAGULATION CONSULT NOTE - Initial Consult  Pharmacy Consult for Heparin Indication: chest pain/ACS  Allergies  Allergen Reactions  . Latex Itching and Rash    Patient Measurements: Height: 5\' 2"  (157.5 cm) Weight: 254 lb 11.2 oz (115.531 kg) (scale A) IBW/kg (Calculated) : 50.1 Heparin Dosing Weight: 78.5 kg  Vital Signs: Temp: 97.6 F (36.4 C) (12/26 1954) Temp Source: Oral (12/26 1954) BP: 120/62 mmHg (12/26 1954) Pulse Rate: 64 (12/26 1954)  Labs:  Recent Labs  08/14/14 2241 08/15/14 0722 08/15/14 1104 08/15/14 1421 08/15/14 1937  HGB 16.2*  --   --   --   --   HCT 48.0*  --   --   --   --   PLT 95*  --   --   --   --   HEPARINUNFRC  --   --   --   --  0.35  CREATININE 0.91  --   --   --   --   TROPONINI 0.22* 0.19* 0.20* 0.19*  --     Estimated Creatinine Clearance: 86.1 mL/min (by C-G formula based on Cr of 0.91).   Medical History: Past Medical History  Diagnosis Date  . CHF (congestive heart failure)   . Anxiety   . Pancreatitis   . Cancer   . Cataracts, bilateral     Medications:  Prescriptions prior to admission  Medication Sig Dispense Refill Last Dose  . Melatonin 5 MG TABS Take 5 mg by mouth at bedtime as needed (sleep).   Past Month at Unknown time   Scheduled:  . ketorolac  30 mg Intravenous 4 times per day   Assessment: 53yo female presents with chest pain. Pharmacy is consulted to dose heparin for ACS/chest pain. Hgb is 16.2, Plt 95, sCr 0.91.  Pt received enoxaparin 40mg  one time at 0930. Will reduce heparin bolus.  Initial HL is therapeutic at 0.35 on heparin 1000 units/hr. No bleeding is noted.  Goal of Therapy:  Heparin level 0.3-0.7 units/ml Monitor platelets by anticoagulation protocol: Yes   Plan:  Continue heparin infusion 1000 units/hr Check anti-Xa level daily while on heparin Continue to monitor H&H and platelets  Monitor s/sx of bleeding  Andrey Cota. Diona Foley, PharmD Clinical Pharmacist Pager  [phone] 08/15/2014,8:14 PM

## 2014-08-15 NOTE — Progress Notes (Signed)
NP on call notified as patient requesting psych consult.  NP ordered xanax PRN.  Patient agreeable to this plan.  Patient's daughter at bedside.  Patient denies any questions or concerns at this time.  Will continue to monitor.

## 2014-08-15 NOTE — Progress Notes (Signed)
Cassandra Kicks, NP notified as patient stating she is still experiencing chest pain, rating 5/10 across mid chest.  Per NP, perform stat EKG, administer nitro SL, and reassess pain.  Scheduled Toradol ordered.  EKG performed and in chart.  Nitro administered x2, pain decreased from 5/10 to 2/10, which patient states is acceptable.  Will continue to monitor.

## 2014-08-15 NOTE — Consult Note (Signed)
Reason for Consult:Retained sutures Referring Physician: [name] is an 53 y.o. female.  HPI: Cassandra Rivera had right wrist surgery about 1 year ago to repair a broken wrist. She never followed up. She is unwilling to talk about the circumstances regarding that. She states she had never shown the wrist to anyone until now. She denies any problems with the incision.  Past Medical History  Diagnosis Date  . CHF (congestive heart failure)   . Anxiety   . Pancreatitis   . Cancer   . Cataracts, bilateral     Past Surgical History  Procedure Laterality Date  . Abdominal hysterectomy    . Cholecystectomy      History reviewed. No pertinent family history.  Social History:  reports that she has quit smoking. Her smoking use included Cigarettes. She smoked 0.00 packs per day. She has never used smokeless tobacco. She reports that she does not drink alcohol or use illicit drugs.  Allergies:  Allergies  Allergen Reactions  . Latex Itching and Rash    Results for orders placed or performed during the hospital encounter of 08/14/14 (from the past 48 hour(s))  CBC with Differential     Status: Abnormal   Collection Time: 08/14/14 10:41 PM  Result Value Ref Range   WBC 8.5 4.0 - 10.5 K/uL   RBC 5.54 (H) 3.87 - 5.11 MIL/uL   Hemoglobin 16.2 (H) 12.0 - 15.0 g/dL   HCT 48.0 (H) 36.0 - 46.0 %   MCV 86.6 78.0 - 100.0 fL   MCH 29.2 26.0 - 34.0 pg   MCHC 33.8 30.0 - 36.0 g/dL   RDW 13.4 11.5 - 15.5 %   Platelets 95 (L) 150 - 400 K/uL    Comment: PLATELET COUNT CONFIRMED BY SMEAR   Neutrophils Relative % 68 43 - 77 %   Neutro Abs 5.8 1.7 - 7.7 K/uL   Lymphocytes Relative 23 12 - 46 %   Lymphs Abs 2.0 0.7 - 4.0 K/uL   Monocytes Relative 5 3 - 12 %   Monocytes Absolute 0.5 0.1 - 1.0 K/uL   Eosinophils Relative 3 0 - 5 %   Eosinophils Absolute 0.3 0.0 - 0.7 K/uL   Basophils Relative 1 0 - 1 %   Basophils Absolute 0.0 0.0 - 0.1 K/uL  Basic metabolic panel     Status: Abnormal    Collection Time: 08/14/14 10:41 PM  Result Value Ref Range   Sodium 137 135 - 145 mmol/L    Comment: Please note change in reference range.   Potassium 3.9 3.5 - 5.1 mmol/L    Comment: Please note change in reference range.   Chloride 104 96 - 112 mEq/L   CO2 24 19 - 32 mmol/L   Glucose, Bld 94 70 - 99 mg/dL   BUN 14 6 - 23 mg/dL   Creatinine, Ser 0.91 0.50 - 1.10 mg/dL   Calcium 8.9 8.4 - 10.5 mg/dL   GFR calc non Af Amer 71 (L) >90 mL/min   GFR calc Af Amer 82 (L) >90 mL/min    Comment: (NOTE) The eGFR has been calculated using the CKD EPI equation. This calculation has not been validated in all clinical situations. eGFR's persistently <90 mL/min signify possible Chronic Kidney Disease.    Anion gap 9 5 - 15  Troponin I     Status: Abnormal   Collection Time: 08/14/14 10:41 PM  Result Value Ref Range   Troponin I 0.22 (H) <0.031 ng/mL  Comment:        PERSISTENTLY INCREASED TROPONIN VALUES IN THE RANGE OF 0.04-0.49 ng/mL CAN BE SEEN IN:       -UNSTABLE ANGINA       -CONGESTIVE HEART FAILURE       -MYOCARDITIS       -CHEST TRAUMA       -ARRYHTHMIAS       -LATE PRESENTING MYOCARDIAL INFARCTION       -COPD   CLINICAL FOLLOW-UP RECOMMENDED. Please note change in reference range.   Troponin I-serum (0, 3, 6 hours)     Status: Abnormal   Collection Time: 08/15/14  7:22 AM  Result Value Ref Range   Troponin I 0.19 (H) <0.031 ng/mL    Comment:        PERSISTENTLY INCREASED TROPONIN VALUES IN THE RANGE OF 0.04-0.49 ng/mL CAN BE SEEN IN:       -UNSTABLE ANGINA       -CONGESTIVE HEART FAILURE       -MYOCARDITIS       -CHEST TRAUMA       -ARRYHTHMIAS       -LATE PRESENTING MYOCARDIAL INFARCTION       -COPD   CLINICAL FOLLOW-UP RECOMMENDED. Please note change in reference range.   Brain natriuretic peptide     Status: None   Collection Time: 08/15/14  7:22 AM  Result Value Ref Range   B Natriuretic Peptide 25.3 0.0 - 100.0 pg/mL    Comment: Please note change in  reference range.  TSH     Status: None   Collection Time: 08/15/14  7:22 AM  Result Value Ref Range   TSH 2.832 0.350 - 4.500 uIU/mL  Lipid panel     Status: None   Collection Time: 08/15/14  7:22 AM  Result Value Ref Range   Cholesterol 171 0 - 200 mg/dL   Triglycerides 90 <150 mg/dL   HDL 57 >39 mg/dL   Total CHOL/HDL Ratio 3.0 RATIO   VLDL 18 0 - 40 mg/dL   LDL Cholesterol 96 0 - 99 mg/dL    Comment:        Total Cholesterol/HDL:CHD Risk Coronary Heart Disease Risk Table                     Men   Women  1/2 Average Risk   3.4   3.3  Average Risk       5.0   4.4  2 X Average Risk   9.6   7.1  3 X Average Risk  23.4   11.0        Use the calculated Patient Ratio above and the CHD Risk Table to determine the patient's CHD Risk.        ATP III CLASSIFICATION (LDL):  <100     mg/dL   Optimal  100-129  mg/dL   Near or Above                    Optimal  130-159  mg/dL   Borderline  160-189  mg/dL   High  >190     mg/dL   Very High     Dg Chest 2 View  08/14/2014   CLINICAL DATA:  Acute onset of shooting bilateral chest pain anteriorly. Initial encounter.  EXAM: CHEST  2 VIEW  COMPARISON:  None.  FINDINGS: The lungs are well-aerated. Vascular congestion is noted. Increased interstitial markings may reflect mild interstitial edema. There is no evidence of  pleural effusion or pneumothorax.  The heart is borderline enlarged. No acute osseous abnormalities are seen.  IMPRESSION: Vascular congestion and borderline cardiomegaly. Increased interstitial markings may reflect mild interstitial edema.   Electronically Signed   By: Garald Balding M.D.   On: 08/14/2014 22:59   Ct Angio Chest Pe W/cm &/or Wo Cm  08/15/2014   CLINICAL DATA:  Acute onset of labored breathing and generalized chest pain. Initial encounter.  EXAM: CT ANGIOGRAPHY CHEST WITH CONTRAST  TECHNIQUE: Multidetector CT imaging of the chest was performed using the standard protocol during bolus administration of intravenous  contrast. Multiplanar CT image reconstructions and MIPs were obtained to evaluate the vascular anatomy.  CONTRAST:  124m OMNIPAQUE IOHEXOL 350 MG/ML SOLN  COMPARISON:  Chest radiograph performed 08/14/2014  FINDINGS: There is no evidence of pulmonary embolus.  There is a mildly mosaic pattern of parenchymal attenuation bilaterally. The lungs are otherwise grossly clear. There is no evidence of significant focal consolidation, pleural effusion or pneumothorax. No masses are identified; no abnormal focal contrast enhancement is seen.  There is dilatation of the main pulmonary outflow tract to 3.7 cm, raising concern for some degree of pulmonary arterial hypertension. The mediastinum is otherwise unremarkable. There is no evidence of mediastinal lymphadenopathy. No pericardial effusion is identified. The great vessels are grossly unremarkable in appearance. Incidental note is made of a direct origin of the left vertebral artery from the aortic arch. No axillary lymphadenopathy is seen. The visualized portions of the thyroid gland are unremarkable in appearance.  The diffusely nodular appearance of the liver is compatible with cirrhosis. The spleen appears mildly bulky.  No acute osseous abnormalities are seen.  Review of the MIP images confirms the above findings.  IMPRESSION: 1. No evidence of pulmonary embolus. 2. Mildly mosaic pattern of parenchymal attenuation noted bilaterally. Lungs otherwise grossly clear. 3. Dilatation of the main pulmonary outflow tract to 3.7 cm, raising concern for some degree of pulmonary arterial hypertension. 4. Changes of hepatic cirrhosis noted.   Electronically Signed   By: JGarald BaldingM.D.   On: 08/15/2014 02:30    Review of Systems  Musculoskeletal: Negative.    Blood pressure 124/53, pulse 74, temperature 97.3 F (36.3 C), temperature source Oral, resp. rate 18, height _0  (1.575 m), weight 254 lb 11.2 oz (115.531 kg), SpO2 95 %. Physical Exam  Musculoskeletal:   Volar wrist shows a well-healed incision with ~6 nylon sutures in place.  Sutures removed without difficulty.  Assessment/Plan: Right wrist incision with retained sutures - No further treatment necessary.    MLisette Abu PA-C Pager: [phone]/26/2015, 12:03 PM

## 2014-08-15 NOTE — Progress Notes (Signed)
Dr. Wynonia Lawman notified and aware about patient's chest pain, currently rating 2/10.  Will continue to monitor.

## 2014-08-16 DIAGNOSIS — Z9049 Acquired absence of other specified parts of digestive tract: Secondary | ICD-10-CM | POA: Diagnosis not present

## 2014-08-16 DIAGNOSIS — K2980 Duodenitis without bleeding: Secondary | ICD-10-CM | POA: Diagnosis not present

## 2014-08-16 DIAGNOSIS — D696 Thrombocytopenia, unspecified: Secondary | ICD-10-CM | POA: Diagnosis not present

## 2014-08-16 DIAGNOSIS — Z6841 Body Mass Index (BMI) 40.0 and over, adult: Secondary | ICD-10-CM | POA: Diagnosis not present

## 2014-08-16 DIAGNOSIS — F419 Anxiety disorder, unspecified: Secondary | ICD-10-CM | POA: Diagnosis present

## 2014-08-16 DIAGNOSIS — Z9889 Other specified postprocedural states: Secondary | ICD-10-CM | POA: Diagnosis not present

## 2014-08-16 DIAGNOSIS — Z791 Long term (current) use of non-steroidal anti-inflammatories (NSAID): Secondary | ICD-10-CM | POA: Diagnosis not present

## 2014-08-16 DIAGNOSIS — Z90710 Acquired absence of both cervix and uterus: Secondary | ICD-10-CM | POA: Diagnosis not present

## 2014-08-16 DIAGNOSIS — R109 Unspecified abdominal pain: Secondary | ICD-10-CM | POA: Diagnosis present

## 2014-08-16 DIAGNOSIS — C801 Malignant (primary) neoplasm, unspecified: Secondary | ICD-10-CM | POA: Diagnosis present

## 2014-08-16 DIAGNOSIS — Z859 Personal history of malignant neoplasm, unspecified: Secondary | ICD-10-CM | POA: Diagnosis not present

## 2014-08-16 DIAGNOSIS — M940 Chondrocostal junction syndrome [Tietze]: Secondary | ICD-10-CM | POA: Diagnosis present

## 2014-08-16 DIAGNOSIS — R9431 Abnormal electrocardiogram [ECG] [EKG]: Secondary | ICD-10-CM | POA: Diagnosis present

## 2014-08-16 DIAGNOSIS — H5441 Blindness, right eye, normal vision left eye: Secondary | ICD-10-CM | POA: Diagnosis present

## 2014-08-16 DIAGNOSIS — K8020 Calculus of gallbladder without cholecystitis without obstruction: Secondary | ICD-10-CM | POA: Diagnosis not present

## 2014-08-16 DIAGNOSIS — R079 Chest pain, unspecified: Secondary | ICD-10-CM | POA: Diagnosis present

## 2014-08-16 DIAGNOSIS — R748 Abnormal levels of other serum enzymes: Secondary | ICD-10-CM | POA: Diagnosis present

## 2014-08-16 DIAGNOSIS — Z8659 Personal history of other mental and behavioral disorders: Secondary | ICD-10-CM | POA: Diagnosis not present

## 2014-08-16 DIAGNOSIS — H269 Unspecified cataract: Secondary | ICD-10-CM | POA: Diagnosis not present

## 2014-08-16 DIAGNOSIS — I509 Heart failure, unspecified: Secondary | ICD-10-CM | POA: Diagnosis not present

## 2014-08-16 DIAGNOSIS — K769 Liver disease, unspecified: Secondary | ICD-10-CM | POA: Diagnosis present

## 2014-08-16 DIAGNOSIS — Z79899 Other long term (current) drug therapy: Secondary | ICD-10-CM | POA: Diagnosis not present

## 2014-08-16 DIAGNOSIS — Z87891 Personal history of nicotine dependence: Secondary | ICD-10-CM | POA: Diagnosis not present

## 2014-08-16 DIAGNOSIS — R0789 Other chest pain: Secondary | ICD-10-CM | POA: Diagnosis not present

## 2014-08-16 DIAGNOSIS — Z91040 Latex allergy status: Secondary | ICD-10-CM | POA: Diagnosis not present

## 2014-08-16 DIAGNOSIS — Z8249 Family history of ischemic heart disease and other diseases of the circulatory system: Secondary | ICD-10-CM | POA: Diagnosis not present

## 2014-08-16 LAB — CBC
HCT: 47.9 % — ABNORMAL HIGH (ref 36.0–46.0)
HEMOGLOBIN: 15.8 g/dL — AB (ref 12.0–15.0)
MCH: 29.3 pg (ref 26.0–34.0)
MCHC: 33 g/dL (ref 30.0–36.0)
MCV: 88.9 fL (ref 78.0–100.0)
Platelets: 83 10*3/uL — ABNORMAL LOW (ref 150–400)
RBC: 5.39 MIL/uL — AB (ref 3.87–5.11)
RDW: 13.6 % (ref 11.5–15.5)
WBC: 6.1 10*3/uL (ref 4.0–10.5)

## 2014-08-16 LAB — HEPARIN LEVEL (UNFRACTIONATED): Heparin Unfractionated: 0.31 IU/mL (ref 0.30–0.70)

## 2014-08-16 MED ORDER — ENOXAPARIN SODIUM 40 MG/0.4ML ~~LOC~~ SOLN
40.0000 mg | SUBCUTANEOUS | Status: DC
  Administered 2014-08-16 – 2014-08-17 (×2): 40 mg via SUBCUTANEOUS
  Filled 2014-08-16 (×2): qty 0.4

## 2014-08-16 MED ORDER — ALPRAZOLAM 0.5 MG PO TABS
0.5000 mg | ORAL_TABLET | Freq: Three times a day (TID) | ORAL | Status: DC
  Administered 2014-08-16 – 2014-08-17 (×3): 0.5 mg via ORAL
  Filled 2014-08-16 (×3): qty 1

## 2014-08-16 MED ORDER — ALUM & MAG HYDROXIDE-SIMETH 200-200-20 MG/5ML PO SUSP
30.0000 mL | ORAL | Status: DC | PRN
  Administered 2014-08-16: 30 mL via ORAL
  Filled 2014-08-16: qty 30

## 2014-08-16 NOTE — Progress Notes (Signed)
Pt said she feels like there is something wrong with her liver or that she has stomach ulcers. She has dull burning pain in her abdomen and she said something is "bad wrong". She insisted that I turn off her Heparin drip. I explained what her Heparin was ordered for and offered something for her abdominal pain and something to calm her nerves but she refused. Dr on call notified.

## 2014-08-16 NOTE — Plan of Care (Signed)
Problem: Phase II Progression Outcomes Goal: Cath/PCI Day Path if indicated Outcome: Progressing Cath planned for Monday, 12/28

## 2014-08-16 NOTE — Progress Notes (Addendum)
Patient asleep after AM xanax dose.  Patient resting comfortably throughout shift.  This evening, patient states that she continues to experience stomach burning/full sensation.  Bowel sounds active, stomach remains distended.  Cardiology PRN order placed for Maalox.  Patient made aware and agreeable.  Will continue to monitor.
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
## 2014-08-16 NOTE — Progress Notes (Signed)
Went in to patient's room for assessment, patient states, "I'm leaving.  I'm pretty sure this is my gallbladder hurting and that can be managed outpatient.  If they want me to make a follow-up appointment with a cardiologist, I will certainly do that."  When asked, patient clarified that she is expecting the MD to release her and that she is not leaving without discharge papers.  Cecilie Kicks, NP notified.  Patient updated.  Patient refusing telemetry and heparin infusion at this time.  Dr. Wynonia Lawman aware.  Will continue to monitor.

## 2014-08-17 ENCOUNTER — Emergency Department (HOSPITAL_COMMUNITY): Payer: BC Managed Care – PPO

## 2014-08-17 ENCOUNTER — Encounter (HOSPITAL_COMMUNITY): Payer: Self-pay | Admitting: Cardiology

## 2014-08-17 ENCOUNTER — Emergency Department (HOSPITAL_COMMUNITY)
Admission: EM | Admit: 2014-08-17 | Discharge: 2014-08-18 | Disposition: A | Discharge status: Home / Self Care | Payer: BC Managed Care – PPO | Attending: Emergency Medicine | Admitting: Emergency Medicine

## 2014-08-17 ENCOUNTER — Encounter (HOSPITAL_COMMUNITY)
Admission: EM | Disposition: A | Discharge status: Home / Self Care | Payer: Self-pay | Source: Home / Self Care | Attending: Internal Medicine

## 2014-08-17 ENCOUNTER — Encounter (HOSPITAL_COMMUNITY): Payer: Self-pay

## 2014-08-17 DIAGNOSIS — Z8659 Personal history of other mental and behavioral disorders: Secondary | ICD-10-CM | POA: Insufficient documentation

## 2014-08-17 DIAGNOSIS — H269 Unspecified cataract: Secondary | ICD-10-CM | POA: Insufficient documentation

## 2014-08-17 DIAGNOSIS — K2980 Duodenitis without bleeding: Secondary | ICD-10-CM | POA: Insufficient documentation

## 2014-08-17 DIAGNOSIS — K8020 Calculus of gallbladder without cholecystitis without obstruction: Secondary | ICD-10-CM | POA: Diagnosis not present

## 2014-08-17 DIAGNOSIS — Z91040 Latex allergy status: Secondary | ICD-10-CM | POA: Insufficient documentation

## 2014-08-17 DIAGNOSIS — Z9889 Other specified postprocedural states: Secondary | ICD-10-CM | POA: Insufficient documentation

## 2014-08-17 DIAGNOSIS — Z859 Personal history of malignant neoplasm, unspecified: Secondary | ICD-10-CM | POA: Insufficient documentation

## 2014-08-17 DIAGNOSIS — R1013 Epigastric pain: Secondary | ICD-10-CM

## 2014-08-17 DIAGNOSIS — Z9049 Acquired absence of other specified parts of digestive tract: Secondary | ICD-10-CM | POA: Insufficient documentation

## 2014-08-17 DIAGNOSIS — I509 Heart failure, unspecified: Secondary | ICD-10-CM | POA: Insufficient documentation

## 2014-08-17 DIAGNOSIS — R1011 Right upper quadrant pain: Secondary | ICD-10-CM

## 2014-08-17 DIAGNOSIS — R0789 Other chest pain: Secondary | ICD-10-CM

## 2014-08-17 DIAGNOSIS — Z79899 Other long term (current) drug therapy: Secondary | ICD-10-CM | POA: Insufficient documentation

## 2014-08-17 DIAGNOSIS — R072 Precordial pain: Secondary | ICD-10-CM

## 2014-08-17 DIAGNOSIS — R079 Chest pain, unspecified: Secondary | ICD-10-CM

## 2014-08-17 DIAGNOSIS — Z87891 Personal history of nicotine dependence: Secondary | ICD-10-CM | POA: Insufficient documentation

## 2014-08-17 DIAGNOSIS — Z90710 Acquired absence of both cervix and uterus: Secondary | ICD-10-CM | POA: Insufficient documentation

## 2014-08-17 DIAGNOSIS — Z791 Long term (current) use of non-steroidal anti-inflammatories (NSAID): Secondary | ICD-10-CM | POA: Insufficient documentation

## 2014-08-17 HISTORY — PX: LEFT HEART CATHETERIZATION WITH CORONARY ANGIOGRAM: SHX5451

## 2014-08-17 HISTORY — DX: Other chest pain: R07.89

## 2014-08-17 HISTORY — PX: LEFT HEART CATH: SHX5946

## 2014-08-17 LAB — CBC WITH DIFFERENTIAL/PLATELET
Basophils Absolute: 0 10*3/uL (ref 0.0–0.1)
Basophils Relative: 0 % (ref 0–1)
Eosinophils Absolute: 0.1 10*3/uL (ref 0.0–0.7)
Eosinophils Relative: 2 % (ref 0–5)
HEMATOCRIT: 44.3 % (ref 36.0–46.0)
HEMOGLOBIN: 14.6 g/dL (ref 12.0–15.0)
LYMPHS ABS: 0.9 10*3/uL (ref 0.7–4.0)
Lymphocytes Relative: 13 % (ref 12–46)
MCH: 28.7 pg (ref 26.0–34.0)
MCHC: 33 g/dL (ref 30.0–36.0)
MCV: 87 fL (ref 78.0–100.0)
MONOS PCT: 6 % (ref 3–12)
Monocytes Absolute: 0.4 10*3/uL (ref 0.1–1.0)
NEUTROS ABS: 5.5 10*3/uL (ref 1.7–7.7)
NEUTROS PCT: 79 % — AB (ref 43–77)
Platelets: 79 10*3/uL — ABNORMAL LOW (ref 150–400)
RBC: 5.09 MIL/uL (ref 3.87–5.11)
RDW: 13.5 % (ref 11.5–15.5)
WBC: 6.9 10*3/uL (ref 4.0–10.5)

## 2014-08-17 LAB — COMPREHENSIVE METABOLIC PANEL
ALK PHOS: 107 U/L (ref 39–117)
ALT: 18 U/L (ref 0–35)
AST: 25 U/L (ref 0–37)
Albumin: 3.2 g/dL — ABNORMAL LOW (ref 3.5–5.2)
Anion gap: 13 (ref 5–15)
BILIRUBIN TOTAL: 1 mg/dL (ref 0.3–1.2)
BUN: 10 mg/dL (ref 6–23)
CHLORIDE: 108 meq/L (ref 96–112)
CO2: 19 mmol/L (ref 19–32)
CREATININE: 0.95 mg/dL (ref 0.50–1.10)
Calcium: 8.5 mg/dL (ref 8.4–10.5)
GFR, EST AFRICAN AMERICAN: 78 mL/min — AB (ref >90)
GFR, EST NON AFRICAN AMERICAN: 67 mL/min — AB (ref >90)
GLUCOSE: 142 mg/dL — AB (ref 70–99)
POTASSIUM: 4.1 mmol/L (ref 3.5–5.1)
Sodium: 140 mmol/L (ref 135–145)
Total Protein: 6.2 g/dL (ref 6.0–8.3)

## 2014-08-17 LAB — CBC
HCT: 43.4 % (ref 36.0–46.0)
Hemoglobin: 14.5 g/dL (ref 12.0–15.0)
MCH: 29 pg (ref 26.0–34.0)
MCHC: 33.4 g/dL (ref 30.0–36.0)
MCV: 86.8 fL (ref 78.0–100.0)
Platelets: 83 10*3/uL — ABNORMAL LOW (ref 150–400)
RBC: 5 MIL/uL (ref 3.87–5.11)
RDW: 13.5 % (ref 11.5–15.5)
WBC: 6.7 10*3/uL (ref 4.0–10.5)

## 2014-08-17 LAB — BASIC METABOLIC PANEL
Anion gap: 7 (ref 5–15)
BUN: 11 mg/dL (ref 6–23)
CHLORIDE: 105 meq/L (ref 96–112)
CO2: 26 mmol/L (ref 19–32)
Calcium: 8.4 mg/dL (ref 8.4–10.5)
Creatinine, Ser: 0.83 mg/dL (ref 0.50–1.10)
GFR calc Af Amer: >90 mL/min (ref >90)
GFR, EST NON AFRICAN AMERICAN: 79 mL/min — AB (ref >90)
Glucose, Bld: 115 mg/dL — ABNORMAL HIGH (ref 70–99)
POTASSIUM: 4.1 mmol/L (ref 3.5–5.1)
SODIUM: 138 mmol/L (ref 135–145)

## 2014-08-17 LAB — I-STAT TROPONIN, ED: Troponin i, poc: 0 ng/mL (ref 0.00–0.08)

## 2014-08-17 LAB — PROTIME-INR
INR: 1.19 (ref 0.00–1.49)
PROTHROMBIN TIME: 15.2 s (ref 11.6–15.2)

## 2014-08-17 LAB — HEPARIN LEVEL (UNFRACTIONATED): Heparin Unfractionated: <0.1 IU/mL — ABNORMAL LOW (ref 0.30–0.70)

## 2014-08-17 LAB — I-STAT CG4 LACTIC ACID, ED: Lactic Acid, Venous: 1.29 mmol/L (ref 0.5–2.2)

## 2014-08-17 LAB — TROPONIN I: TROPONIN I: 0.17 ng/mL — AB (ref <0.031)

## 2014-08-17 LAB — LIPASE, BLOOD: LIPASE: 24 U/L (ref 11–59)

## 2014-08-17 SURGERY — LEFT HEART CATHETERIZATION WITH CORONARY ANGIOGRAM
Anesthesia: LOCAL

## 2014-08-17 MED ORDER — ONDANSETRON HCL 4 MG/2ML IJ SOLN
4.0000 mg | Freq: Once | INTRAMUSCULAR | Status: AC
  Administered 2014-08-17: 4 mg via INTRAVENOUS
  Filled 2014-08-17: qty 2

## 2014-08-17 MED ORDER — SODIUM CHLORIDE 0.9 % IV SOLN
INTRAVENOUS | Status: AC
  Administered 2014-08-17: 10:00:00 via INTRAVENOUS

## 2014-08-17 MED ORDER — SODIUM CHLORIDE 0.9 % IV SOLN: 1.0000 mL/kg/h | INTRAVENOUS | Status: DC

## 2014-08-17 MED ORDER — VERAPAMIL HCL 2.5 MG/ML IV SOLN
INTRAVENOUS | Status: AC
  Filled 2014-08-17: qty 2

## 2014-08-17 MED ORDER — ASPIRIN 81 MG PO CHEW
81.0000 mg | CHEWABLE_TABLET | ORAL | Status: AC
Start: 2014-08-17 — End: 2014-08-17
  Administered 2014-08-17: 81 mg via ORAL
  Filled 2014-08-17: qty 1

## 2014-08-17 MED ORDER — FENTANYL CITRATE 0.05 MG/ML IJ SOLN
INTRAMUSCULAR | Status: AC
  Filled 2014-08-17: qty 2

## 2014-08-17 MED ORDER — HEPARIN (PORCINE) IN NACL 2-0.9 UNIT/ML-% IJ SOLN
INTRAMUSCULAR | Status: AC
  Filled 2014-08-17: qty 1500

## 2014-08-17 MED ORDER — SODIUM CHLORIDE 0.9 % IJ SOLN: 3.0000 mL | INTRAMUSCULAR | Status: DC | PRN

## 2014-08-17 MED ORDER — NITROGLYCERIN 1 MG/10 ML FOR IR/CATH LAB
INTRA_ARTERIAL | Status: AC
  Filled 2014-08-17: qty 10

## 2014-08-17 MED ORDER — MIDAZOLAM HCL 2 MG/2ML IJ SOLN
INTRAMUSCULAR | Status: AC
  Filled 2014-08-17: qty 2

## 2014-08-17 MED ORDER — SODIUM CHLORIDE 0.9 % IV SOLN: 250.0000 mL | INTRAVENOUS | Status: DC | PRN

## 2014-08-17 MED ORDER — MORPHINE SULFATE 4 MG/ML IJ SOLN
4.0000 mg | Freq: Once | INTRAMUSCULAR | Status: AC
  Administered 2014-08-17: 4 mg via INTRAVENOUS
  Filled 2014-08-17: qty 1

## 2014-08-17 MED ORDER — LIDOCAINE HCL (PF) 1 % IJ SOLN
INTRAMUSCULAR | Status: AC
  Filled 2014-08-17: qty 30

## 2014-08-17 MED ORDER — IOHEXOL 300 MG/ML  SOLN
100.0000 mL | Freq: Once | INTRAMUSCULAR | Status: AC | PRN
  Administered 2014-08-17: 100 mL via INTRAVENOUS

## 2014-08-17 MED ORDER — HEPARIN SODIUM (PORCINE) 1000 UNIT/ML IJ SOLN
INTRAMUSCULAR | Status: AC
  Filled 2014-08-17: qty 1

## 2014-08-17 MED ORDER — SODIUM CHLORIDE 0.9 % IJ SOLN
3.0000 mL | Freq: Two times a day (BID) | INTRAMUSCULAR | Status: DC
  Administered 2014-08-17: 3 mL via INTRAVENOUS

## 2014-08-17 NOTE — Interval H&P Note (Signed)
History and Physical Interval Note:  08/17/2014 7:44 AM  Cassandra Rivera  has presented today for surgery, with the diagnosis of unstable angina  The various methods of treatment have been discussed with the patient and family. After consideration of risks, benefits and other options for treatment, the patient has consented to  Procedure(s): LEFT HEART CATHETERIZATION WITH CORONARY ANGIOGRAM (N/A) as a surgical intervention .  The patient's history has been reviewed, patient examined, no change in status, stable for surgery.  I have reviewed the patient's chart and labs.  Questions were answered to the patient's satisfaction.    Cath Lab Visit (complete for each Cath Lab visit)  Clinical Evaluation Leading to the Procedure:   ACS: Yes.    Non-ACS:    Anginal Classification: CCS IV  Anti-ischemic medical therapy: No Therapy  Non-Invasive Test Results: No non-invasive testing performed  Prior CABG: No previous CABG       Sherren Mocha

## 2014-08-17 NOTE — Discharge Summary (Signed)
Physician Discharge Summary     Patient ID: Cassandra Rivera MRN: [id number] DOB/AGE: [id number] 53 y.o.   Primary Cardiologist: Dr. Burt Knack  Admit date: 08/14/2014 Discharge date: 08/17/2014  Admission Diagnoses: Chest Pain w/ moderate risk for cardiac etiology  Discharge Diagnoses:  Noncardiac Chest Pain  Discharged Condition: stable  Hospital Course: 53 year old morbidly obese, sedentary female with past medical history of prolonged hospitalization in [place] for what sounded as respiratory failure secondary to severe ARDS???? resulting in multiorgan failure (patient claims that she had heart failure, liver failure, pancreatitis). She denies any prior cardiac history.   She [facility]14 with complaints of severe substernal chest pain radiating over her chest and back. She denied associated dyspnea however reported that pain was worse with inspiration. Subsequent CT of the chest was negative for PE. EKGs demonstrated T-wave inversions in V1-V3. Initial troponin was mildly elevated at 0.22. This was recycled x 2 and remained mildly elevated at 0.19 and 0.20.  Given her body habitus, it was felt that she would not be a good candidate for noninvasive testing Thus, a LHC was recommended for definitive assessment of her coronaries. The procedure was performed by Dr. Burt Knack. Access was obtained via the left radial artery. She was found to have widely patent coronary arteries with minimal irregularities as outlined below. She has a right dominant coronary system and normal LV function with elevated LVEDP. EF was estimated at 55-60%. Her chest pain was felt to be noncardiac. She left the cath lab in stable condition and was monitored post procedure. She has no complications and remained stable. She was last seen and examined by Dr. Marlou Porch, who determined she was stable for discharge home.   Consults: None  Significant Diagnostic Studies:    2D echo 08/15/14 Study Conclusions  -  Left ventricle: The cavity size was normal. Wall thickness was increased in a pattern of mild LVH. Systolic function was normal. The estimated ejection fraction was in the range of 55% to 60%. Wall motion was normal; there were no regional wall motion abnormalities. Left ventricular diastolic function parameters were normal.  Impressions:  - Normal LV function; mild LVH.   LHC 08/17/14 Procedural Findings: Hemodynamics: AO 150/79 LV 153/24  Coronary angiography: Coronary dominance: right  Left mainstem: The left main stem is widely patent. The vessel divides into the LAD and left circumflex.  Left anterior descending (LAD): The LAD is patent without significant stenosis identified. The vessel supplies a relatively small territory with a small to medium caliber first diagonal branch. The LAD terminates in the mid to distal anterior wall. It does not reach the apex of the heart.  Left circumflex (LCx): The left circumflex supplies a large territory. The vessel gives off an intermediate branch with no significant stenosis. The OM branches are widely patent. There is no stenosis identified throughout the left circumflex distribution.  Right coronary artery (RCA): Dominant vessel, widely patent throughout. There is minimal tapering of the RCA through the junction of the mid and distal vessel. There is approximately 20% stenosis through this region. The PDA is widely patent without stenosis.  Left ventriculography: Left ventricular systolic function is normal, LVEF is estimated at 55-65%, there is no significant mitral regurgitation   Estimated Blood Loss: Minimal  Final Conclusions:  1. Widely patent coronary arteries with minimal irregularities as outlined above, right dominant coronary system 2. Normal LV systolic function with elevated LVEDP   Treatments: See Hospital Course  Discharge Exam: Blood pressure 135/85, pulse 74,  temperature 98.5 F (36.9 C), temperature  source Oral, resp. rate 18, height 5\' 2"  (1.575 m), weight 256 lb 3.2 oz (116.212 kg), SpO2 98 %.   Disposition: Final discharge disposition not confirmed      Discharge Instructions    Diet - low sodium heart healthy    Complete by:  As directed      Increase activity slowly    Complete by:  As directed             Medication List    TAKE these medications        Melatonin 5 MG Tabs  Take 5 mg by mouth at bedtime as needed (sleep).       Follow-up Information    Follow up with Ermalinda Barrios, PA-C On 08/31/2014.   Specialty:  Cardiology   Why:  10:45 am    Contact information:   [address] [place] [address] [phone]      Makena, INCLUDING PHYSICIAN TIME: >30 MINUTES   Signed: Lyda Jester 08/17/2014, 11:21 AM
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
## 2014-08-17 NOTE — Progress Notes (Signed)
Patient is being discharged home. She has been provided with discharge instructions. RN went over instructions with the patient and answered all questions that she has. She is being transported home by her daughter.

## 2014-08-17 NOTE — ED Notes (Signed)
Pt ambulated with assistance to restroom  - reports that she was unable to obtain urine specimen.

## 2014-08-17 NOTE — ED Notes (Signed)
Patient transported to Ultrasound 

## 2014-08-17 NOTE — ED Notes (Signed)
Per EMS - pt was seen on 08/14/2014 in the ED, bloodwork showed damage to heart. Pt was admitted. Exploratory catheterization with no damage. This afternoon pt began having pain in abdomen - pt states "feels like gravel is inside stomach." Pt reports decreased appetite, and pain 10/10. Given 324mg  aspirin at 2119. BP 168/114, 86bpm, 98% on 2L. EKG slight ST inversion.

## 2014-08-17 NOTE — Progress Notes (Signed)
    Subjective:  Relieved. Post cath. No CP.   Objective:  Vital Signs in the last 24 hours: Temp:  [97.1 F (36.2 C)-98.5 F (36.9 C)] 98.5 F (36.9 C) (12/28 0502) Pulse Rate:  [69-78] 69 (12/28 0747) Resp:  [18-20] 18 (12/28 0502) BP: (116-140)/(48-85) 128/48 mmHg (12/28 0502) SpO2:  [95 %-100 %] 98 % (12/28 0502) Weight:  [256 lb 3.2 oz (711.657 kg)] 256 lb 3.2 oz (116.212 kg) (12/28 0502)  Intake/Output from previous day: 12/27 0701 - 12/28 0700 In: 1620 [P.O.:1620] Out: 1100 [Urine:1100]   Physical Exam: General: Well developed, obese, well nourished, in no acute distress. Head:  Normocephalic and atraumatic. Lungs: Clear to auscultation and percussion. Heart: Normal S1 and S2.  No murmur, rubs or gallops.  Abdomen: soft, non-tender, positive bowel sounds. Extremities: No clubbing or cyanosis. No edema. TR band in place.  Neurologic: Alert and oriented x 3.    Lab Results:  Recent Labs  08/16/14 0330 08/17/14 0528  WBC 6.1 6.7  HGB 15.8* 14.5  PLT 83* 83*    Recent Labs  08/14/14 2241 08/17/14 0528  NA 137 138  K 3.9 4.1  CL 104 105  CO2 24 26  GLUCOSE 94 115*  BUN 14 11  CREATININE 0.91 0.83    Recent Labs  08/15/14 1104 08/15/14 1421  TROPONINI 0.20* 0.19*   Telemetry: NSR Personally viewed.  Cardiac Studies:  Cath no CAD  Scheduled Meds: . ALPRAZolam  0.5 mg Oral TID  . enoxaparin (LOVENOX) injection  40 mg Subcutaneous Q24H  . sodium chloride  3 mL Intravenous Q12H   Continuous Infusions: . [START ON 08/18/2014] sodium chloride    . sodium chloride     PRN Meds:.sodium chloride, acetaminophen, alum & mag hydroxide-simeth, nitroGLYCERIN, ondansetron (ZOFRAN) IV, sodium chloride Assessment/Plan:  Active Problems:   Chest pain   Pain in the chest  DC home following radial protocol.  PCP Weight loss.  Non cardiac CP.    Cassandra Rivera, [place] 08/17/2014, 8:41 AM

## 2014-08-17 NOTE — ED Notes (Signed)
Lab called, not enough urine in previous sample sent- new order placed.

## 2014-08-17 NOTE — Care Management Note (Signed)
    Page 1 of 1   08/17/2014     10:23:51 AM CARE MANAGEMENT NOTE 08/17/2014  Patient:  Cassandra Rivera, Cassandra Rivera   Account Number:  [id number]  Date Initiated:  08/17/2014  Documentation initiated by:  Mariann Laster  Subjective/Objective Assessment:   CP, unstable angina     Action/Plan:   CM to follow for disposition needs   Anticipated DC Date:  08/17/2014   Anticipated DC Plan:  HOME/SELF CARE         Choice offered to / List presented to:             Status of service:  Completed, signed off Medicare Important Message given?  YES (If response is "NO", the following Medicare IM given date fields will be blank) Date Medicare IM given:  08/17/2014 Medicare IM given by:  Tysin Salada Date Additional Medicare IM given:  08/17/2014 Additional Medicare IM given by:    Discharge Disposition:  HOME/SELF CARE  Per UR Regulation:  Reviewed for med. necessity/level of care/duration of stay  If discussed at Long Length of Stay Meetings, dates discussed:    Comments:  Alexander Aument RN, BSN, MSHL, CCM  Nurse - Case Manager,  (Unit [facility])  [phone]   08/17/2014 Left Heart Cath, Selective Coronary Angiography, LV angiography Home / Self care.

## 2014-08-17 NOTE — ED Provider Notes (Signed)
CSN: [id number]     Arrival date & time 08/17/14  2044 History   First MD Initiated Contact with Patient 08/17/14 2106     Chief Complaint  Patient presents with  . Abdominal Pain     (Consider location/radiation/quality/duration/timing/severity/associated sxs/prior Treatment) HPI Comments: Patient presents with severe epigastric and right upper quadrant abdominal pain has been constant for 2 days. This pain began while she was admitted to the hospital for chest pain. She was discharged today after having a negative cardiac catheterization. The pain in her stomach is different than the chest pain she was having previously. It is associated with nausea but no vomiting. She denies any diarrhea or change in bowel habits. She endorses pain is better with pain medication and worse with palpation and movement. She reports previous history of pancreatitis in 2008 when she when she apparently had severe ARDS. She denies any alcohol abuse. She does have a history of reflux and ulcers. Denies any blood in her stool. She still has a gallbladder.  The history is provided by the patient.    Past Medical History  Diagnosis Date  . CHF (congestive heart failure)   . Anxiety   . Pancreatitis   . Cancer   . Cataracts, bilateral   . Non-cardiac chest pain 08/17/14    LHC: nl coronaries + nl LV function w/ EF of 55-60%   Past Surgical History  Procedure Laterality Date  . Abdominal hysterectomy    . Cholecystectomy    . Left heart cath  08/17/14    LHC: nl coronaries + nl LV function w/ EF of 55-60%  . Left heart catheterization with coronary angiogram N/A 08/17/2014    Procedure: LEFT HEART CATHETERIZATION WITH CORONARY ANGIOGRAM;  Surgeon: Blane Ohara, MD;  Location: [facility] CATH LAB;  Service: Cardiovascular;  Laterality: N/A;   History reviewed. No pertinent family history. History  Substance Use Topics  . Smoking status: Former Smoker    Types: Cigarettes  . Smokeless tobacco: Never Used   . Alcohol Use: No   OB History    No data available     Review of Systems  Constitutional: Positive for activity change, appetite change and fatigue. Negative for fever.  Respiratory: Negative for cough, chest tightness and shortness of breath.   Cardiovascular: Negative for chest pain.  Gastrointestinal: Positive for nausea, vomiting and abdominal pain.  Genitourinary: Negative for dysuria, hematuria, vaginal bleeding and vaginal discharge.  Musculoskeletal: Negative for myalgias and arthralgias.  Skin: Negative for rash.  Neurological: Negative for dizziness, weakness and headaches.  A complete 10 system review of systems was obtained and all systems are negative except as noted in the HPI and PMH.      Allergies  Latex  Home Medications   Prior to Admission medications   Medication Sig Start Date End Date Taking? Authorizing Provider  esomeprazole (NEXIUM) 40 MG capsule Take 40 mg by mouth daily at 12 noon.   Yes Historical Provider, MD  ibuprofen (ADVIL,MOTRIN) 200 MG tablet Take 200 mg by mouth every 6 (six) hours as needed.   Yes Historical Provider, MD  Melatonin 5 MG TABS Take 5 mg by mouth at bedtime as needed (sleep).   Yes Historical Provider, MD  HYDROcodone-acetaminophen (NORCO/VICODIN) 5-325 MG per tablet Take 2 tablets by mouth every 4 (four) hours as needed. 08/18/14   Ezequiel Essex, MD  omeprazole (PRILOSEC) 20 MG capsule Take 1 capsule (20 mg total) by mouth daily. 08/18/14   Ezequiel Essex, MD  ondansetron (ZOFRAN) 4 MG tablet Take 1 tablet (4 mg total) by mouth every 6 (six) hours. 08/18/14   Ezequiel Essex, MD   BP 121/57 mmHg  Pulse 72  Resp 17  SpO2 96% Physical Exam  Constitutional: She is oriented to person, place, and time. She appears well-developed and well-nourished. No distress.  HENT:  Head: Normocephalic and atraumatic.  Mouth/Throat: Oropharynx is clear and moist. No oropharyngeal exudate.  Eyes: Conjunctivae and EOM are normal. Pupils  are equal, round, and reactive to light.  Neck: Normal range of motion. Neck supple.  No meningismus.  Cardiovascular: Normal rate, regular rhythm, normal heart sounds and intact distal pulses.   No murmur heard. Pulmonary/Chest: Effort normal and breath sounds normal. No respiratory distress.  Abdominal: Soft. There is tenderness. There is guarding. There is no rebound.  Tender to epigastric and right quadrant with guarding  Musculoskeletal: Normal range of motion. She exhibits no edema or tenderness.  Neurological: She is alert and oriented to person, place, and time. No cranial nerve deficit. She exhibits normal muscle tone. Coordination normal.  No ataxia on finger to nose bilaterally. No pronator drift. 5/5 strength throughout. CN 2-12 intact. Negative Romberg. Equal grip strength. Sensation intact. Gait is normal.   Skin: Skin is warm.  Psychiatric: She has a normal mood and affect. Her behavior is normal.  Nursing note and vitals reviewed.   ED Course  Procedures (including critical care time) Labs Review Labs Reviewed  CBC WITH DIFFERENTIAL - Abnormal; Notable for the following:    Platelets 79 (*)    Neutrophils Relative % 79 (*)    All other components within normal limits  COMPREHENSIVE METABOLIC PANEL - Abnormal; Notable for the following:    Glucose, Bld 142 (*)    Albumin 3.2 (*)    GFR calc non Af Amer 67 (*)    GFR calc Af Amer 78 (*)    All other components within normal limits  TROPONIN I - Abnormal; Notable for the following:    Troponin I 0.17 (*)    All other components within normal limits  LIPASE, BLOOD  URINALYSIS, ROUTINE W REFLEX MICROSCOPIC  I-STAT TROPOININ, ED  I-STAT CG4 LACTIC ACID, ED    Imaging Review Ct Abdomen Pelvis W Contrast  08/18/2014   CLINICAL DATA:  Epigastric pain, status post cardiac catheterization this morning  EXAM: CT ABDOMEN AND PELVIS WITH CONTRAST  TECHNIQUE: Multidetector CT imaging of the abdomen and pelvis was performed  using the standard protocol following bolus administration of intravenous contrast.  CONTRAST:  177mL OMNIPAQUE IOHEXOL 300 MG/ML  SOLN  COMPARISON:  None.  FINDINGS: Lower chest:  Lung bases are clear.  Hepatobiliary: Cirrhotic configuration of the liver. No suspicious/enhancing hepatic lesions.  Layering gallstones, without associated inflammatory changes. No intrahepatic or extrahepatic ductal dilatation.  Pancreas: Within normal limits.  Spleen: Enlarged, measuring 15.4 cm in maximal craniocaudal dimension.  Adrenals/Urinary Tract: Adrenal glands are unremarkable.  Kidneys are within normal limits.  No hydronephrosis.  Bladder is within normal limits.  Stomach/Bowel: Stomach is unremarkable.  No evidence of bowel obstruction.  Normal appendix.  Inflammatory changes along the second portion of the duodenum, favored to reflect duodenitis, possibly with superimposed inflammatory changes along a duodenum diverticulum or underlying duodenal ulcer (series 301/ image 14). No free air.  Vascular/Lymphatic: Atherosclerotic calcifications of the abdominal aorta and branch vessels.  Portal vein is patent.  No suspicious abdominopelvic lymphadenopathy.  Reproductive: Uterus is unremarkable.  Bilateral ovaries are within normal limits.  Other: No abdominopelvic ascites.  Moderate fat containing periumbilical hernia (series 21/image 36).  Musculoskeletal: Visualized osseous structures are within normal limits.  IMPRESSION: Suspected duodenitis, as described above.  No free air.  No evidence of bowel obstruction.  Normal appendix.  Cholelithiasis, without associated inflammatory changes.  Cirrhosis.  Splenomegaly.  Portal vein is patent.   Electronically Signed   By: Julian Hy M.D.   On: 08/18/2014 00:41   US Abdomen Limited Ruq  08/17/2014   CLINICAL DATA:  Right upper quadrant pain.  EXAM: US ABDOMEN LIMITED - RIGHT UPPER QUADRANT  COMPARISON:  None.  FINDINGS: Gallbladder:  Multiple echogenic and shadowing  stones within the gallbladder lumen. There is no gallbladder wall thickening or focal tenderness.  Common bile duct:  Diameter: 4 mm  Liver:  Surface nodularity and heterogeneous echotexture consistent with cirrhosis. No focal lesion is identified. Antegrade flow in the main portal vein.  IMPRESSION: 1. Cholelithiasis without findings of acute cholecystitis. 2. Cirrhosis.   Electronically Signed   By: Jorje Guild M.D.   On: 08/17/2014 22:35     EKG Interpretation   Date/Time:  Monday August 17 2014 20:44:39 EST Ventricular Rate:  86 PR Interval:  144 QRS Duration: 94 QT Interval:  402 QTC Calculation: 481 R Axis:   72 Text Interpretation:  Sinus rhythm No significant change was found  Confirmed by Wyvonnia Dusky  MD, Deijah Spikes [phone]) on 08/17/2014 8:59:22 PM      MDM   Final diagnoses:  Epigastric pain  RUQ pain  Duodenitis  Gallstones    Patient with severe upper abdominal pain and nausea for the past 2 days. History of GERD and ulcers. Gallbladder intact. Catheterization earlier today showed normal coronary arteries.  LFTs normal. Lipase normal. No leukocytosis. Troponin 0.17.  Clean catheterization today.  D/w Dr. Mare Ferrari who agrees to pursue noncardiac causes of patient's pain.   Ultrasound shows cholelithiasis without evidence of cholecystitis.  CT scan shows evidence of duodenitis and cholelithiasis. No evidence of perforation.  We'll start PPI, referred to gastroenterology and surgery. Patient instructed to avoid NSAIDs, caffeine, alcohol, spicy foods Tolerating PO in the ED. Return precautions discussed.  BP 121/57 mmHg  Pulse 72  Resp 17  SpO2 96%    Ezequiel Essex, MD 08/18/14 [phone]

## 2014-08-17 NOTE — CV Procedure (Signed)
    Cardiac Catheterization Procedure Note  Name: Cassandra Rivera MRN: [id number] DOB: 04/14/1961  Procedure: Left Heart Cath, Selective Coronary Angiography, LV angiography  Indication: Chest pain, abnormal troponin   Procedural Details: The left wrist was prepped, draped, and anesthetized with 1% lidocaine. Using the modified Seldinger technique, a 5/6 French Slender sheath was introduced into the left radial artery. 3 mg of verapamil was administered through the sheath, weight-based unfractionated heparin was administered intravenously. Standard Judkins catheters were used for selective coronary angiography and left ventriculography. Catheter exchanges were performed over an exchange length guidewire. There were no immediate procedural complications. A TR band was used for radial hemostasis at the completion of the procedure.  The patient was transferred to the post catheterization recovery area for further monitoring.  Procedural Findings: Hemodynamics: AO 150/79 LV 153/24  Coronary angiography: Coronary dominance: right  Left mainstem: The left main stem is widely patent. The vessel divides into the LAD and left circumflex.  Left anterior descending (LAD): The LAD is patent without significant stenosis identified. The vessel supplies a relatively small territory with a small to medium caliber first diagonal branch. The LAD terminates in the mid to distal anterior wall. It does not reach the apex of the heart.  Left circumflex (LCx): The left circumflex supplies a large territory. The vessel gives off an intermediate branch with no significant stenosis. The OM branches are widely patent. There is no stenosis identified throughout the left circumflex distribution.  Right coronary artery (RCA): Dominant vessel, widely patent throughout. There is minimal tapering of the RCA through the junction of the mid and distal vessel. There is approximately 20% stenosis through this region. The PDA is  widely patent without stenosis.  Left ventriculography: Left ventricular systolic function is normal, LVEF is estimated at 55-65%, there is no significant mitral regurgitation   Estimated Blood Loss: Minimal  Final Conclusions:   1. Widely patent coronary arteries with minimal irregularities as outlined above, right dominant coronary system 2. Normal LV systolic function with elevated LVEDP  Recommendations: Suspect noncardiac chest pain. Anticipate discharge home today. Will arrange social work consultation to help the patient obtain primary care and available local resources that she has moved here recently.  Sherren Mocha MD, [facility] 08/17/2014, 8:19 AM

## 2014-08-17 NOTE — Progress Notes (Signed)
UR completed Basha Krygier K. Brion Sossamon, RN, BSN, [place], CCM  08/17/2014 10:29 AM

## 2014-08-18 LAB — URINALYSIS, ROUTINE W REFLEX MICROSCOPIC
BILIRUBIN URINE: NEGATIVE
Glucose, UA: NEGATIVE mg/dL
HGB URINE DIPSTICK: NEGATIVE
KETONES UR: NEGATIVE mg/dL
Leukocytes, UA: NEGATIVE
NITRITE: NEGATIVE
PH: 6.5 (ref 5.0–8.0)
Protein, ur: NEGATIVE mg/dL
SPECIFIC GRAVITY, URINE: 1.006 (ref 1.005–1.030)
Urobilinogen, UA: 0.2 mg/dL (ref 0.0–1.0)

## 2014-08-18 MED ORDER — ONDANSETRON HCL 4 MG PO TABS: 4.0000 mg | ORAL_TABLET | Freq: Four times a day (QID) | ORAL | Status: DC

## 2014-08-18 MED ORDER — HYDROCODONE-ACETAMINOPHEN 5-325 MG PO TABS: 2.0000 | ORAL_TABLET | ORAL | Status: DC | PRN

## 2014-08-18 MED ORDER — OMEPRAZOLE 20 MG PO CPDR: 20.0000 mg | DELAYED_RELEASE_CAPSULE | Freq: Every day | ORAL | Status: DC

## 2014-08-18 NOTE — Discharge Instructions (Signed)
Duodenitis Take the stomach medications as prescribed. Avoid alcohol, anti-inflammatory drugs, caffeine and spicy foods. Follow-up with the stomach doctors and surgeons to evaluate her gallbladder. Return to the ED if you develop new or worsening symptoms. Duodenitis is inflammation of the lining of the first part of your small intestine (duodenum). There are two types of duodenitis:  Acute duodenitis (develops suddenly and is short lived).   Chronic duodenitis (develops over an extended period and lasts months to years). CAUSES  Duodenitis is most often caused by infection with the bacterium Helicobacter pylori (H. pylori). H. pylori increases the production of stomach acid and causes changes in the environment of the duodenum. This irritates and damages the cells of the duodenum causing inflammation. Other causes of duodenitis include:   Long-term use of nonsteroidal anti-inflammatory drugs (NSAIDs). NSAIDs change the lining of the duodenum and make it more prone to injury from stomach acid.  Excessive use of alcohol. Alcohol increases stomach acid and changes the lining of the duodenum which makes it more likely for inflammation to develop.  Giardiasis. Giardiasis is a common infection of the small intestine. It can cause inflammation of the duodenum.   Other gastrointestinal disorders, such as Crohn disease. People with these disorders are more likely to develop duodenitis. SYMPTOMS  Although duodenitis does not always cause symptoms, symptoms that do occur include:  Nausea or vomiting.  Gassy, bloated feeling or an uncomfortable feeling of fullness after eating.  Burning, cramps, or pain in the upper abdominal area. DIAGNOSIS  To diagnose duodenitis, your health care provider may use results from:   An exam of the duodenum using a thin tube with a tiny camera on the tip, which is placed down your throat (endoscope). The endoscope is passed through your stomach and into your  duodenum. Sometimes a sample of tissue from your duodenum is removed with the endoscope. The sample is then examined under a microscope (biopsy) for signs of inflammation and H. pylori infection.   Tests that check samples of your blood or stool for H. pylori infection.   A test that checks the gases in a sample of your expired breath for H. pylori infection. The test measures the levels of carbon dioxide in your breath after you drink a special solution.  An X-ray exam using a special liquid that you swallow to illuminate your digestive tract (barium) to show signs of inflammation. TREATMENT  Treatment will depend on the cause of the duodenitis. The most common treatments include:  Use of medication to treat infection.  Medication to reduce stomach acid.  Discontinuing the use of NSAIDs.  Management of other gastrointestinal conditions.  Avoiding alcohol consumption. Additionally, taking the following steps can help to reduce the severity of your symptoms:  Drink enough water to keep your urine clear or pale yellow.  Avoid consuming these foods or drinks:  Caffeinated drinks.  Chocolate.  Peppermint or mint-flavored food or drinks.  Garlic.  Onions.  Spicy foods.  Citrus fruits, such as oranges, lemons, or limes.  Foods that use tomato-based sauces, such as pasta sauce, chili, salsa, and pizza.  Fatty foods.  Fried foods. Document Released: 12/02/2012 Document Revised: 12/22/2013 Document Reviewed: 12/02/2012 [facility] Patient Information 2015 [place], [place]. This information is not intended to replace advice given to you by your health care provider. Make sure you discuss any questions you have with your health care provider.

## 2014-08-19 ENCOUNTER — Encounter: Payer: Self-pay | Admitting: Gastroenterology

## 2014-08-31 ENCOUNTER — Ambulatory Visit: Payer: BC Managed Care – PPO | Admitting: Gastroenterology

## 2014-08-31 ENCOUNTER — Encounter: Payer: BC Managed Care – PPO | Admitting: Physician Assistant

## 2014-08-31 NOTE — Progress Notes (Signed)
Patient ID: Cassandra Rivera, female   DOB: 07-Apr-1961, 54 y.o.   MRN: [id number] The patient's chart has been reviewed by Dr. Fuller Plan  and the recommendations are noted below.  Follow-up advised. Schedule patient for next available appointment. Outcome of communication with the patient: unable to reach patient,will send letter

## 2014-09-08 ENCOUNTER — Encounter: Payer: BC Managed Care – PPO | Admitting: Physician Assistant

## 2014-09-22 ENCOUNTER — Encounter: Payer: Medicare Other | Admitting: Cardiology

## 2014-10-06 ENCOUNTER — Encounter: Payer: Medicare Other | Admitting: Physician Assistant

## 2014-10-21 ENCOUNTER — Ambulatory Visit: Payer: Medicare Other | Admitting: Psychology

## 2014-10-21 ENCOUNTER — Encounter (HOSPITAL_COMMUNITY): Payer: Self-pay | Admitting: Emergency Medicine

## 2014-10-21 ENCOUNTER — Observation Stay (HOSPITAL_COMMUNITY)
Admission: EM | Admit: 2014-10-21 | Discharge: 2014-10-21 | Discharge status: Against Medical Advice | Payer: BLUE CROSS/BLUE SHIELD | Attending: Internal Medicine | Admitting: Internal Medicine

## 2014-10-21 DIAGNOSIS — Z8719 Personal history of other diseases of the digestive system: Secondary | ICD-10-CM | POA: Insufficient documentation

## 2014-10-21 DIAGNOSIS — R778 Other specified abnormalities of plasma proteins: Secondary | ICD-10-CM | POA: Insufficient documentation

## 2014-10-21 DIAGNOSIS — Z91040 Latex allergy status: Secondary | ICD-10-CM | POA: Insufficient documentation

## 2014-10-21 DIAGNOSIS — R079 Chest pain, unspecified: Secondary | ICD-10-CM | POA: Diagnosis present

## 2014-10-21 DIAGNOSIS — R0602 Shortness of breath: Secondary | ICD-10-CM

## 2014-10-21 DIAGNOSIS — Z6841 Body Mass Index (BMI) 40.0 and over, adult: Secondary | ICD-10-CM | POA: Insufficient documentation

## 2014-10-21 DIAGNOSIS — R7989 Other specified abnormal findings of blood chemistry: Secondary | ICD-10-CM

## 2014-10-21 DIAGNOSIS — Z9842 Cataract extraction status, left eye: Secondary | ICD-10-CM | POA: Insufficient documentation

## 2014-10-21 DIAGNOSIS — I509 Heart failure, unspecified: Secondary | ICD-10-CM | POA: Diagnosis not present

## 2014-10-21 DIAGNOSIS — Z90710 Acquired absence of both cervix and uterus: Secondary | ICD-10-CM | POA: Insufficient documentation

## 2014-10-21 DIAGNOSIS — F419 Anxiety disorder, unspecified: Secondary | ICD-10-CM | POA: Diagnosis not present

## 2014-10-21 DIAGNOSIS — R9431 Abnormal electrocardiogram [ECG] [EKG]: Secondary | ICD-10-CM | POA: Insufficient documentation

## 2014-10-21 DIAGNOSIS — Z9841 Cataract extraction status, right eye: Secondary | ICD-10-CM | POA: Diagnosis not present

## 2014-10-21 DIAGNOSIS — R0789 Other chest pain: Secondary | ICD-10-CM | POA: Diagnosis not present

## 2014-10-21 DIAGNOSIS — Z9889 Other specified postprocedural states: Secondary | ICD-10-CM | POA: Diagnosis not present

## 2014-10-21 DIAGNOSIS — Z87891 Personal history of nicotine dependence: Secondary | ICD-10-CM | POA: Insufficient documentation

## 2014-10-21 HISTORY — DX: Acute and subacute hepatic failure without coma: K72.00

## 2014-10-21 LAB — CBC
HCT: 47.1 % — ABNORMAL HIGH (ref 36.0–46.0)
HEMOGLOBIN: 15.8 g/dL — AB (ref 12.0–15.0)
MCH: 29.3 pg (ref 26.0–34.0)
MCHC: 33.5 g/dL (ref 30.0–36.0)
MCV: 87.4 fL (ref 78.0–100.0)
PLATELETS: 97 10*3/uL — AB (ref 150–400)
RBC: 5.39 MIL/uL — AB (ref 3.87–5.11)
RDW: 13.2 % (ref 11.5–15.5)
WBC: 7.6 10*3/uL (ref 4.0–10.5)

## 2014-10-21 LAB — BASIC METABOLIC PANEL
Anion gap: 7 (ref 5–15)
BUN: 17 mg/dL (ref 6–23)
CHLORIDE: 109 mmol/L (ref 96–112)
CO2: 24 mmol/L (ref 19–32)
Calcium: 8.8 mg/dL (ref 8.4–10.5)
Creatinine, Ser: 0.84 mg/dL (ref 0.50–1.10)
GFR calc non Af Amer: 78 mL/min — ABNORMAL LOW (ref >90)
Glucose, Bld: 91 mg/dL (ref 70–99)
POTASSIUM: 3.8 mmol/L (ref 3.5–5.1)
Sodium: 140 mmol/L (ref 135–145)

## 2014-10-21 LAB — BRAIN NATRIURETIC PEPTIDE: B Natriuretic Peptide: 19.5 pg/mL (ref 0.0–100.0)

## 2014-10-21 LAB — TROPONIN I: Troponin I: 0.27 ng/mL — ABNORMAL HIGH (ref <0.031)

## 2014-10-21 LAB — CK: Total CK: 59 U/L (ref 7–177)

## 2014-10-21 MED ORDER — ONDANSETRON HCL 4 MG PO TABS: 4.0000 mg | ORAL_TABLET | Freq: Four times a day (QID) | ORAL | Status: DC | PRN

## 2014-10-21 MED ORDER — ENOXAPARIN SODIUM 40 MG/0.4ML ~~LOC~~ SOLN
40.0000 mg | SUBCUTANEOUS | Status: DC
  Filled 2014-10-21: qty 0.4

## 2014-10-21 MED ORDER — ACETAMINOPHEN 325 MG PO TABS: 650.0000 mg | ORAL_TABLET | Freq: Four times a day (QID) | ORAL | Status: DC | PRN

## 2014-10-21 MED ORDER — SODIUM CHLORIDE 0.9 % IJ SOLN: 3.0000 mL | Freq: Two times a day (BID) | INTRAMUSCULAR | Status: DC

## 2014-10-21 MED ORDER — ASPIRIN EC 325 MG PO TBEC: 325.0000 mg | DELAYED_RELEASE_TABLET | Freq: Every day | ORAL | Status: DC

## 2014-10-21 MED ORDER — ONDANSETRON HCL 4 MG/2ML IJ SOLN: 4.0000 mg | Freq: Four times a day (QID) | INTRAMUSCULAR | Status: DC | PRN

## 2014-10-21 MED ORDER — ACETAMINOPHEN 650 MG RE SUPP: 650.0000 mg | Freq: Four times a day (QID) | RECTAL | Status: DC | PRN

## 2014-10-21 MED ORDER — LORAZEPAM 0.5 MG PO TABS: 0.5000 mg | ORAL_TABLET | Freq: Four times a day (QID) | ORAL | Status: DC | PRN

## 2014-10-21 MED ORDER — ASPIRIN 81 MG PO CHEW
324.0000 mg | CHEWABLE_TABLET | Freq: Once | ORAL | Status: AC
  Administered 2014-10-21: 324 mg via ORAL
  Filled 2014-10-21: qty 4

## 2014-10-21 NOTE — Progress Notes (Signed)
Shift event: This NP was on nsg unit tending to another urgent/emergent matter with another pt. Observed pt in hallway and she was belligerent and trying to leave unit when RN stopped her. Continued to be belligerent on the way back to her room. Yelling stating she was leaving and needed something more substantial to eat. RNs tried to talk her into staying and on my advice communicated to pt that her cardiac enzymes were slightly elevated along with ? Ischemia on her EKG and her workup was not complete. RN communicated the risks of leaving, MI or even death. Pt stating in loud voice that no way she was staying and to "get this IV out", "I'm leaving".  In review of chart, pt did have a negative cardiac cath in December 2015.  This NP reviewed her chart since it was known she was a behavioral health pt. Could find no reason in chart for pt not to make her own decision. Pt alert and oriented. Signed AMA papers and left.  Clance Boll, NP [facility]

## 2014-10-21 NOTE — ED Provider Notes (Signed)
CSN: [id number]     Arrival date & time 10/21/14  1523 History   First MD Initiated Contact with Patient 10/21/14 1526     Chief Complaint  Patient presents with  . Anxiety     (Consider location/radiation/quality/duration/timing/severity/associated sxs/prior Treatment) The history is provided by the patient and medical records. No language interpreter was used.      Cassandra Rivera is a 54 y.o. 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 She was in a taxi going to a [facility] appointment, but she was dropped at the wrong location.  They began walking and pt began to feel SOB with wheezing.  They stopped at a MD office and pt reports beginning to panic.  She reports she had associated CP that radiated from her neck into her chest.  She reports she is very concerned she may have had an MI; but her pain was very different from her previous MI.  She reports the episode was similar to previous anxiety attacks.  Pt denies CP or SOB on exertion; peripheral edema. Pt reports her symptoms are completely resolved at this time.  No aggravating or alleviating factors.  She denies fever, chills, headache, cough, abd pain, N/V/D, diaphoresis, weakness, dizziness, syncope, dysuria.  Pt was a smoker for 30 years, smoking 1.5 ppd and quit 8 years ago.     Past Medical History  Diagnosis Date  . CHF (congestive heart failure)   . Anxiety   . Pancreatitis   . Cataracts, bilateral   . Non-cardiac chest pain 08/17/14    LHC: nl coronaries + nl LV function w/ EF of 55-60%   Past Surgical History  Procedure Laterality Date  . Abdominal hysterectomy    . Left heart cath  08/17/14    LHC: nl coronaries + nl LV function w/ EF of 55-60%  . Left heart catheterization with coronary angiogram N/A 08/17/2014    Procedure: LEFT HEART CATHETERIZATION WITH CORONARY ANGIOGRAM;  Surgeon: Blane Ohara,  MD;  Location: [facility] CATH LAB;  Service: Cardiovascular;  Laterality: N/A;   No family history on file. History  Substance Use Topics  . Smoking status: Former Smoker    Types: Cigarettes  . Smokeless tobacco: Never Used  . Alcohol Use: No   OB History    No data available     Review of Systems  Constitutional: Negative for fever, diaphoresis, appetite change, fatigue and unexpected weight change.  HENT: Negative for mouth sores.   Eyes: Negative for visual disturbance.  Respiratory: Positive for chest tightness and shortness of breath. Negative for cough and wheezing.   Cardiovascular: Positive for chest pain.  Gastrointestinal: Negative for nausea, vomiting, abdominal pain, diarrhea and constipation.  Endocrine: Negative for polydipsia, polyphagia and polyuria.  Genitourinary: Negative for dysuria, urgency, frequency and hematuria.  Musculoskeletal: Negative for back pain and neck stiffness.  Skin: Negative for rash.  Allergic/Immunologic: Negative for immunocompromised state.  Neurological: Negative for syncope, light-headedness and headaches.  Hematological: Does not bruise/bleed easily.  Psychiatric/Behavioral: Negative for sleep disturbance. The patient is nervous/anxious.       Allergies  Latex  Home Medications   Prior to Admission medications   Medication Sig Start Date End Date Taking? Authorizing Provider  Melatonin 5 MG TABS Take 5 mg by mouth at bedtime as needed (sleep).   Yes Historical Provider, MD  Pseudoephedrine-Ibuprofen 30-200 MG TABS Take 1 tablet by mouth daily  as needed (cold symptoms).   Yes Historical Provider, MD  Valerian 100 MG CAPS Take 1 capsule by mouth daily as needed (anxiety).   Yes Historical Provider, MD  HYDROcodone-acetaminophen (NORCO/VICODIN) 5-325 MG per tablet Take 2 tablets by mouth every 4 (four) hours as needed. Patient not taking: Reported on 10/21/2014 08/18/14   Ezequiel Essex, MD  omeprazole (PRILOSEC) 20 MG capsule Take 1  capsule (20 mg total) by mouth daily. Patient not taking: Reported on 10/21/2014 08/18/14   Ezequiel Essex, MD  ondansetron (ZOFRAN) 4 MG tablet Take 1 tablet (4 mg total) by mouth every 6 (six) hours. Patient not taking: Reported on 10/21/2014 08/18/14   Ezequiel Essex, MD   BP 122/80 mmHg  Pulse 71  Temp(Src) 97.7 F (36.5 C) (Oral)  Resp 18  SpO2 99% Physical Exam  Constitutional: She appears well-developed and well-nourished. No distress.  Awake, alert, nontoxic appearance  HENT:  Head: Normocephalic and atraumatic.  Mouth/Throat: Oropharynx is clear and moist. No oropharyngeal exudate.  Eyes: Conjunctivae are normal. No scleral icterus.  Neck: Normal range of motion. Neck supple.  Cardiovascular: Normal rate, regular rhythm, normal heart sounds and intact distal pulses.   No murmur heard. Pulmonary/Chest: Effort normal and breath sounds normal. No respiratory distress. She has no wheezes.  Equal chest expansion  Abdominal: Soft. Bowel sounds are normal. She exhibits no mass. There is no tenderness. There is no rebound and no guarding.  Musculoskeletal: Normal range of motion. She exhibits no edema.  Neurological: She is alert.  Speech is clear and goal oriented Moves extremities without ataxia  Skin: Skin is warm and dry. She is not diaphoretic. No erythema.  Psychiatric: She has a normal mood and affect.  Nursing note and vitals reviewed.   ED Course  Procedures (including critical care time) Labs Review Labs Reviewed  CBC - Abnormal; Notable for the following:    RBC 5.39 (*)    Hemoglobin 15.8 (*)    HCT 47.1 (*)    Platelets 97 (*)    All other components within normal limits  BASIC METABOLIC PANEL - Abnormal; Notable for the following:    GFR calc non Af Amer 78 (*)    All other components within normal limits  TROPONIN I - Abnormal; Notable for the following:    Troponin I 0.27 (*)    All other components within normal limits  BRAIN NATRIURETIC PEPTIDE  CK     Imaging Review No results found.   EKG Interpretation   Date/Time:  Wednesday October 21 2014 15:27:00 EST Ventricular Rate:  72 PR Interval:  144 QRS Duration: 98 QT Interval:  451 QTC Calculation: 494 R Axis:   73 Text Interpretation:  Sinus rhythm Abnormal T, consider ischemia, anterior  leads since last tracing no significant change Confirmed by [facility]  MD,  ELLIOTT [phone]) on 10/21/2014 4:38:21 PM      08/15/14 Echo: Impressions: - Normal LV function; mild LVH.  08/17/14 Cardiac Cath: Final Conclusions:  1. Widely patent coronary arteries with minimal irregularities as outlined above, right dominant coronary system 2. Normal LV systolic function with elevated LVEDP  MDM   Final diagnoses:  Chest pain, unspecified chest pain type  SOB (shortness of breath)  Elevated troponin   Cassandra Rivera presents with hx and PE consistent with anxiety however pt reports recent MI.  Records show pt admission with elevated troponin on 08/15/14 and normal cardiac cath on 08/17/14.  ECG with T-wave inversion and ST depression V1-V3, slightly worse  than previous.  Will check enzymes and monitor.    5:25 PM Pt with elevated troponin.  Will consult cardiology.    5:47 PM Discussed with Dr. Claiborne Billings who recommends overnight obs with trending troponins and repeat ECG in the morning.  Pt does not have PCP.  Will consult hospitalist for admission.    6:02 PM Discussed with Dr. Carles Collet of [facility] who will admit.    BP 122/80 mmHg  Pulse 71  Temp(Src) 97.7 F (36.5 C) (Oral)  Resp 18  SpO2 99%   Abigail Butts, PA-C 10/21/14 [place], MD 10/22/14 1521

## 2014-10-21 NOTE — ED Notes (Signed)
Pt states she is depression and down about missing appointment today. Denies SI/HI.

## 2014-10-21 NOTE — Progress Notes (Signed)
RN gave patient a sandwich from the refrigerator as the cafeteria was closed. The patient requested a Sprite so RN gave the patient a Sprite. RN was going to place the telemetry monitor on the patient but the RN had to leave the patient's room and go to an emergency situation in another patient's room.

## 2014-10-21 NOTE — ED Notes (Signed)
54 yo with [facility] c/o anxiety attack. Pt and daughter not from here, were attempting to go to [facility] appointment and ended up in the wrong place. Went into a dr office and stated "I can't breath". Pt is now calm alert and oriented. EKG unremarkable. HX of MI.

## 2014-10-21 NOTE — Progress Notes (Signed)
RN saw the patient in front of the [facility]. The patient had been looking for food from a vending machine   The patient then became irate because the vending machine did not take her card.  The patient then told the RN that  She was leaving because she did not have a balanced meal and her daughter needed her at home. The RN tried to let the patient know that she needed to get checked out at the hospital. The PCP was also notified.  The patient became more and more agitated and said that she knew her Troponin was elevated but she was still going home.

## 2014-10-21 NOTE — Progress Notes (Signed)
EDCM spoke to patient at bedside.  Patient confirms she has Medicare A and [employer], without a pcp.  Per chart review, Medicare A listed as primary insurance.  Patient remarked [employer] is under her ex.  [facility] gave patient a list of pcps who accept Medicare insurance within a 10 mile radius of patient's zip code [phone].  [facility] encouraged patient to apply for Medicare part B.  EDCM also provided patient with pamphlet to [facility], explained services.  Patient agreeable to have [facility] email [facility] in attempts to schedule an appointment.  EDCM instructed patient to call in am between 9am and 930am.  Patient thankful for resources.  No further EDCM needs at this time.

## 2014-10-21 NOTE — Progress Notes (Signed)
Pateint asked the RN to remove the NSL PIV. The patient did sign the AMA paper and left the floor.

## 2014-10-21 NOTE — H&P (Signed)
History and Physical  Cassandra Rivera MGQ:[id number] DOB: 07/21/1961 DOA: 10/21/2014   PCP: No primary care provider on file.   Chief Complaint: chest pain  HPI:  54 year old female with a history of anxiety and pancreatitis presents with chest pain that developed earlier in the afternoon on 10/21/2014. The patient had an appointment at behavioral [facility]. Unfortunately, the patient's taxi dropped her off at the wrong location, and the patient was lost and had to walk a certain distance to find the hospital. Because she was late, the patient developed what she felt to be a panic attack with shortness of breath, chest tightness, and some nausea. When she ultimately found to behavioral health Hospital, EMS was activated, and the patient was transferred to the emergency department. Ultimately, when the patient calmed down, her chest discomfort and shortness of breath resolved. She is presently symptom free. Notably, the patient presented to  [facility] on 08/14/14 with a similar presentation and abnormal EKG. She had a left heart catheterization on 08/17/2014 which revealed patent coronaries. echocardiogram at that time showed EF 55-60% with no wall motion abnormalities. During that admission, the patient was also noted to have elevated troponins up to 0.22.it was felt that the patient's chest pain was noncardiac.  In the emergency department, the patient was afebrile and hemodynamically stable. Elevated troponin was noted to be 0.27. EKG showed sinus rhythm with T-wave inversion in V1-V3. BMP and CBC were unremarkable.  Assessment/Plan: atypical chest pain  - likely due to a panic attack  -Patient is symptom-free  -Cycle troponins  -Repeat EKG morning 10/22/2014 -Cardiology was contacted, Dr. Kelly-->recommended cycling troponins and repeat EKG in the morning--> officially consult cardiology if troponins continue to elevate over the wrists continue change of the patient's EKG suggestive of  ischemia  -Ativan 0.5 mg when necessary anxiety  -08/17/2014 left heart catheterization showed widely patent coronary arteries  -08/15/2014 echocardiogram EF 55-60%, no WMA -08/15/2014 CT angio chest negative for PE Anxiety  -Patient will ultimately need to follow up with behavioral health  -Ativan 0.5 mg every 6 hours when necessary anxiety  abnormal EKG  -As discussed above  -Continue aspirin  Morbid obesity  lifestyle modification Active Problems:   Chest pain       Past Medical History  Diagnosis Date  . CHF (congestive heart failure)   . Anxiety   . Pancreatitis   . Cataracts, bilateral   . Non-cardiac chest pain 08/17/14    LHC: nl coronaries + nl LV function w/ EF of 55-60%   Past Surgical History  Procedure Laterality Date  . Abdominal hysterectomy    . Left heart cath  08/17/14    LHC: nl coronaries + nl LV function w/ EF of 55-60%  . Left heart catheterization with coronary angiogram N/A 08/17/2014    Procedure: LEFT HEART CATHETERIZATION WITH CORONARY ANGIOGRAM;  Surgeon: Blane Ohara, MD;  Location: [facility] CATH LAB;  Service: Cardiovascular;  Laterality: N/A;   Social History:  reports that she has quit smoking. Her smoking use included Cigarettes. She has never used smokeless tobacco. She reports that she does not drink alcohol or use illicit drugs.   No family history on file.   Allergies  Allergen Reactions  . Latex Itching and Rash      Prior to Admission medications   Medication Sig Start Date End Date Taking? Authorizing Provider  Melatonin 5 MG TABS Take 5 mg by mouth at bedtime as needed (sleep).  Yes Historical Provider, MD  Pseudoephedrine-Ibuprofen 30-200 MG TABS Take 1 tablet by mouth daily as needed (cold symptoms).   Yes Historical Provider, MD  Valerian 100 MG CAPS Take 1 capsule by mouth daily as needed (anxiety).   Yes Historical Provider, MD  HYDROcodone-acetaminophen (NORCO/VICODIN) 5-325 MG per tablet Take 2 tablets by mouth  every 4 (four) hours as needed. Patient not taking: Reported on 10/21/2014 08/18/14   Ezequiel Essex, MD  omeprazole (PRILOSEC) 20 MG capsule Take 1 capsule (20 mg total) by mouth daily. Patient not taking: Reported on 10/21/2014 08/18/14   Ezequiel Essex, MD  ondansetron (ZOFRAN) 4 MG tablet Take 1 tablet (4 mg total) by mouth every 6 (six) hours. Patient not taking: Reported on 10/21/2014 08/18/14   Ezequiel Essex, MD    Review of Systems:  Constitutional:  No weight loss, night sweats, Fevers, chills, fatigue.  Head&Eyes: No headache.  No vision loss.  No eye pain or scotoma ENT:  No Difficulty swallowing,Tooth/dental problems,Sore throat,  No ear ache, post nasal drip,  Cardio-vascular:  No  Orthopnea, PND, swelling in lower extremities,  dizziness, palpitations  GI:  No  abdominal pain, nausea, vomiting, diarrhea, loss of appetite, hematochezia, melena, heartburn, indigestion, Resp:  No cough. No coughing up of blood .No wheezing.No chest wall deformity  Skin:  no rash or lesions.  GU:  no dysuria, change in color of urine, no urgency or frequency. No flank pain.  Musculoskeletal:  No joint pain or swelling. No decreased range of motion. No back pain.  Psych:  No change in mood or affect. Neurologic: No headache, no dysesthesia, no focal weakness, no vision loss. No syncope  Physical Exam: Filed Vitals:   10/21/14 1523 10/21/14 1527 10/21/14 1735  BP:  149/81 122/80  Pulse:  70 71  Temp:  97.7 F (36.5 C)   TempSrc:  Oral   Resp:  20 18  SpO2: 97% 100% 99%   General:  A&O x 3, NAD, nontoxic, pleasant/cooperative Head/Eye: No conjunctival hemorrhage, no icterus, [place]/AT, No nystagmus ENT:  No icterus,  No thrush, good dentition, no pharyngeal exudate Neck:  No masses, no lymphadenpathy, no bruits CV:  RRR, no rub, no gallop, no S3 Lung:  CTAB, good air movement, no wheeze, no rhonchi Abdomen: soft/NT, +BS, nondistended, no peritoneal signs Ext: No cyanosis, No  rashes, No petechiae, No lymphangitis, No edema  Labs on Admission:  Basic Metabolic Panel:  Recent Labs Lab 10/21/14 1617  NA 140  K 3.8  CL 109  CO2 24  GLUCOSE 91  BUN 17  CREATININE 0.84  CALCIUM 8.8   Liver Function Tests: No results for input(s): AST, ALT, ALKPHOS, BILITOT, PROT, ALBUMIN in the last 168 hours. No results for input(s): LIPASE, AMYLASE in the last 168 hours. No results for input(s): AMMONIA in the last 168 hours. CBC:  Recent Labs Lab 10/21/14 1617  WBC 7.6  HGB 15.8*  HCT 47.1*  MCV 87.4  PLT 97*   Cardiac Enzymes:  Recent Labs Lab 10/21/14 1617  TROPONINI 0.27*   BNP: Invalid input(s): POCBNP CBG: No results for input(s): GLUCAP in the last 168 hours.  Radiological Exams on Admission: No results found.  EKG: Independently reviewed. Sinus rhythm, T-wave inversion in V1-V3    Time spent:50 minutes Code Status:   FULL Family Communication:   daughter updated at bedside   Tonjua Rossetti, DO  [facility] Pager [phone]  If 7PM-7AM, please contact night-coverage [email or web address] Password [facility] 10/21/2014, 6:17 PM

## 2014-10-21 NOTE — ED Notes (Signed)
MD at bedside. Dr. Carles Collet discussing admission.

## 2014-10-22 NOTE — Care Management Note (Signed)
    Page 1 of 1   10/22/2014     4:01:51 PM CARE MANAGEMENT NOTE 10/22/2014  Patient:  Cassandra Rivera, Cassandra Rivera   Account Number:  [id number]  Date Initiated:  10/22/2014  Documentation initiated by:  Dessa Phi  Subjective/Objective Assessment:   54 y/o f admitted w/chest pain.     Action/Plan:   from home.   Anticipated DC Date:  10/21/2014   Anticipated DC Plan:  [place]  CM consult      Choice offered to / List presented to:             Status of service:  Completed, signed off Medicare Important Message given?   (If response is "NO", the following Medicare IM given date fields will be blank) Date Medicare IM given:   Medicare IM given by:   Date Additional Medicare IM given:   Additional Medicare IM given by:    Discharge Disposition:  HOME/SELF CARE  Per UR Regulation:  Reviewed for med. necessity/level of care/duration of stay  If discussed at [place] of Stay Meetings, dates discussed:    Comments:  10/22/14 Dessa Phi RN BSN NCM [phone] d/c prior to review.No orders.

## 2014-11-27 ENCOUNTER — Ambulatory Visit (INDEPENDENT_AMBULATORY_CARE_PROVIDER_SITE_OTHER): Payer: BLUE CROSS/BLUE SHIELD | Admitting: Psychology

## 2014-11-27 DIAGNOSIS — F332 Major depressive disorder, recurrent severe without psychotic features: Secondary | ICD-10-CM | POA: Diagnosis not present

## 2014-11-27 DIAGNOSIS — F411 Generalized anxiety disorder: Secondary | ICD-10-CM

## 2014-12-10 ENCOUNTER — Ambulatory Visit (INDEPENDENT_AMBULATORY_CARE_PROVIDER_SITE_OTHER): Payer: BLUE CROSS/BLUE SHIELD | Admitting: Psychology

## 2014-12-10 DIAGNOSIS — F332 Major depressive disorder, recurrent severe without psychotic features: Secondary | ICD-10-CM

## 2014-12-10 DIAGNOSIS — F411 Generalized anxiety disorder: Secondary | ICD-10-CM | POA: Diagnosis not present

## 2014-12-25 ENCOUNTER — Ambulatory Visit: Payer: BLUE CROSS/BLUE SHIELD | Admitting: Psychology

## 2015-01-07 ENCOUNTER — Ambulatory Visit: Payer: BLUE CROSS/BLUE SHIELD | Admitting: Psychology

## 2015-01-27 ENCOUNTER — Ambulatory Visit (INDEPENDENT_AMBULATORY_CARE_PROVIDER_SITE_OTHER): Payer: BLUE CROSS/BLUE SHIELD | Admitting: Psychology

## 2015-01-27 DIAGNOSIS — F332 Major depressive disorder, recurrent severe without psychotic features: Secondary | ICD-10-CM

## 2015-01-27 DIAGNOSIS — F411 Generalized anxiety disorder: Secondary | ICD-10-CM | POA: Diagnosis not present

## 2015-02-02 ENCOUNTER — Emergency Department (HOSPITAL_COMMUNITY)
Admission: EM | Admit: 2015-02-02 | Discharge: 2015-02-02 | Disposition: A | Discharge status: Home / Self Care | Payer: BLUE CROSS/BLUE SHIELD | Attending: Emergency Medicine | Admitting: Emergency Medicine

## 2015-02-02 ENCOUNTER — Encounter (HOSPITAL_COMMUNITY): Payer: Self-pay | Admitting: Emergency Medicine

## 2015-02-02 DIAGNOSIS — Z91040 Latex allergy status: Secondary | ICD-10-CM | POA: Insufficient documentation

## 2015-02-02 DIAGNOSIS — F419 Anxiety disorder, unspecified: Secondary | ICD-10-CM | POA: Diagnosis not present

## 2015-02-02 DIAGNOSIS — Z8719 Personal history of other diseases of the digestive system: Secondary | ICD-10-CM | POA: Diagnosis not present

## 2015-02-02 DIAGNOSIS — R51 Headache: Secondary | ICD-10-CM | POA: Diagnosis present

## 2015-02-02 DIAGNOSIS — Z791 Long term (current) use of non-steroidal anti-inflammatories (NSAID): Secondary | ICD-10-CM | POA: Insufficient documentation

## 2015-02-02 DIAGNOSIS — I509 Heart failure, unspecified: Secondary | ICD-10-CM | POA: Insufficient documentation

## 2015-02-02 DIAGNOSIS — H269 Unspecified cataract: Secondary | ICD-10-CM | POA: Diagnosis not present

## 2015-02-02 DIAGNOSIS — Z87891 Personal history of nicotine dependence: Secondary | ICD-10-CM | POA: Diagnosis not present

## 2015-02-02 DIAGNOSIS — J0110 Acute frontal sinusitis, unspecified: Secondary | ICD-10-CM | POA: Diagnosis not present

## 2015-02-02 MED ORDER — ALBUTEROL SULFATE HFA 108 (90 BASE) MCG/ACT IN AERS
2.0000 | INHALATION_SPRAY | RESPIRATORY_TRACT | Status: DC
  Administered 2015-02-02: 2 via RESPIRATORY_TRACT
  Filled 2015-02-02: qty 6.7

## 2015-02-02 MED ORDER — AMOXICILLIN-POT CLAVULANATE 875-125 MG PO TABS: 1.0000 | ORAL_TABLET | Freq: Two times a day (BID) | ORAL | Status: DC

## 2015-02-02 NOTE — ED Provider Notes (Signed)
CSN: [id number]     Arrival date & time 02/02/15  1655 History  This chart was scribed for non-physician practitioner, Ottie Glazier, PA-C working with Dorie Rank, MD, by Chester Holstein, ED Scribe. This patient was seen in room WTR6/WTR6 and the patient's care was started at 5:31 PM.       Chief Complaint  Patient presents with  . URI    The history is provided by the patient. No language interpreter was used.   HPI Comments: Cassandra Rivera is a 54 y.o. female with PMHx of CHF, acute liver failure, and chronic sinus infections who presents to the Emergency Department complaining of frontal sinus pain and pressure with onset 2 weeks ago. Pt notes assocaited headache, congestion, ear pain, and post nasal drip. Pt also reports chronic cough. Pt is a former smoker. She states she's out of her albuterol inhaler and would like a refill. Pt took Advil Cold and Sinus which usually gives relief, with no relief. Pt reports PMHx of seasonal allergies and recurrent sinus infections, but has never seen an ENT. She states current symptoms are similar. Pt denies fever and chills.   Past Medical History  Diagnosis Date  . CHF (congestive heart failure)   . Anxiety   . Pancreatitis   . Cataracts, bilateral   . Non-cardiac chest pain 08/17/14    LHC: nl coronaries + nl LV function w/ EF of 55-60%  . Liver failure, acute 2008   Past Surgical History  Procedure Laterality Date  . Abdominal hysterectomy    . Left heart cath  08/17/14    LHC: nl coronaries + nl LV function w/ EF of 55-60%  . Left heart catheterization with coronary angiogram N/A 08/17/2014    Procedure: LEFT HEART CATHETERIZATION WITH CORONARY ANGIOGRAM;  Surgeon: Blane Ohara, MD;  Location: [facility] CATH LAB;  Service: Cardiovascular;  Laterality: N/A;   History reviewed. No pertinent family history. History  Substance Use Topics  . Smoking status: Former Smoker    Types: Cigarettes  . Smokeless tobacco: Never Used  . Alcohol Use:  No   OB History    No data available     Review of Systems  Constitutional: Negative for fever and chills.  HENT: Positive for congestion, ear pain, postnasal drip and sinus pressure. Negative for ear discharge.   Respiratory: Positive for cough.   Neurological: Positive for headaches.      Allergies  Latex  Home Medications   Prior to Admission medications   Medication Sig Start Date End Date Taking? Authorizing Provider  amoxicillin-clavulanate (AUGMENTIN) 875-125 MG per tablet Take 1 tablet by mouth every 12 (twelve) hours. 02/02/15   Dillon Livermore Patel-Mills, PA-C  HYDROcodone-acetaminophen (NORCO/VICODIN) 5-325 MG per tablet Take 2 tablets by mouth every 4 (four) hours as needed. Patient not taking: Reported on 10/21/2014 08/18/14   Ezequiel Essex, MD  Melatonin 5 MG TABS Take 5 mg by mouth at bedtime as needed (sleep).    Historical Provider, MD  omeprazole (PRILOSEC) 20 MG capsule Take 1 capsule (20 mg total) by mouth daily. Patient not taking: Reported on 10/21/2014 08/18/14   Ezequiel Essex, MD  ondansetron (ZOFRAN) 4 MG tablet Take 1 tablet (4 mg total) by mouth every 6 (six) hours. Patient not taking: Reported on 10/21/2014 08/18/14   Ezequiel Essex, MD  Pseudoephedrine-Ibuprofen 30-200 MG TABS Take 1 tablet by mouth daily as needed (cold symptoms).    Historical Provider, MD  Valerian 100 MG CAPS Take 1 capsule by mouth daily  as needed (anxiety).    Historical Provider, MD   BP 151/95 mmHg  Pulse 86  Temp(Src) 98.4 F (36.9 C) (Oral)  Resp 20  SpO2 95% Physical Exam  Constitutional: She is oriented to person, place, and time. She appears well-developed and well-nourished.  HENT:  Head: Normocephalic.  Right Ear: Tympanic membrane, external ear and ear canal normal.  Left Ear: Tympanic membrane, external ear and ear canal normal.  Nose: No rhinorrhea. Right sinus exhibits frontal sinus tenderness (mild). Right sinus exhibits no maxillary sinus tenderness. Left sinus  exhibits frontal sinus tenderness (mild). Left sinus exhibits no maxillary sinus tenderness.  Mouth/Throat: Uvula is midline, oropharynx is clear and moist and mucous membranes are normal. No posterior oropharyngeal erythema.  Eyes: Conjunctivae are normal.  Right eye cataract  Neck: Normal range of motion.  Cardiovascular: Normal rate, regular rhythm and normal heart sounds.  Exam reveals no friction rub.   No murmur heard. Pulmonary/Chest: Effort normal and breath sounds normal. No respiratory distress. She has no wheezes. She has no rales.  Musculoskeletal: Normal range of motion.  Lymphadenopathy:    She has no cervical adenopathy.  Neurological: She is alert and oriented to person, place, and time.  Skin: Skin is warm and dry.  Psychiatric: She has a normal mood and affect. Her behavior is normal.  Nursing note and vitals reviewed.   ED Course  Procedures (including critical care time) DIAGNOSTIC STUDIES: Oxygen Saturation is 98% on room air, normal by my interpretation.    COORDINATION OF CARE: 5:38 PM Discussed treatment plan with patient at beside, the patient agrees with the plan and has no further questions at this time.   Labs Review Labs Reviewed - No data to display  Imaging Review No results found.   EKG Interpretation None      MDM   Final diagnoses:  Acute frontal sinusitis, recurrence not specified  Patient presents for sinus problems which she has had for a couple weeks. She states she has facial pain along the frontal sinuses and has been blowing her nose. I prescribed her Augmentin. She also requested a refill on her albuterol inhaler which I provided her. She can follow up with her pcp and verbally agrees with the plan.   I personally performed the services described in this documentation, which was scribed in my presence. The recorded information has been reviewed and is accurate.    Ottie Glazier, PA-C 02/03/15 1607  Dorie Rank, MD 02/04/15  2300

## 2015-02-02 NOTE — ED Notes (Signed)
Pt states that she has had sinus problems for some time but in the past 'couple of weeks' she has had facial pain and has had to blow her nose a lot. Alert and oriented.

## 2015-02-02 NOTE — Discharge Instructions (Signed)
Sinusitis  Follow up with [place] [facility] to find a primary care physician. Sinusitis is redness, soreness, and puffiness (inflammation) of the air pockets in the bones of your face (sinuses). The redness, soreness, and puffiness can cause air and mucus to get trapped in your sinuses. This can allow germs to grow and cause an infection.  HOME CARE   Drink enough fluids to keep your pee (urine) clear or pale yellow.  Use a humidifier in your home.  Run a hot shower to create steam in the bathroom. Sit in the bathroom with the door closed. Breathe in the steam 3-4 times a day.  Put a warm, moist washcloth on your face 3-4 times a day, or as told by your doctor.  Use salt water sprays (saline sprays) to wet the thick fluid in your nose. This can help the sinuses drain.  Only take medicine as told by your doctor. GET HELP RIGHT AWAY IF:   Your pain gets worse.  You have very bad headaches.  You are sick to your stomach (nauseous).  You throw up (vomit).  You are very sleepy (drowsy) all the time.  Your face is puffy (swollen).  Your vision changes.  You have a stiff neck.  You have trouble breathing. MAKE SURE YOU:   Understand these instructions.  Will watch your condition.  Will get help right away if you are not doing well or get worse. Document Released: 01/24/2008 Document Revised: 05/01/2012 Document Reviewed: 03/12/2012 [facility] Patient Information 2015 [place], [place]. This information is not intended to replace advice given to you by your health care provider. Make sure you discuss any questions you have with your health care provider.

## 2015-02-19 ENCOUNTER — Ambulatory Visit: Payer: BLUE CROSS/BLUE SHIELD | Admitting: Psychology

## 2015-03-24 ENCOUNTER — Ambulatory Visit: Payer: Medicare Other

## 2015-04-05 ENCOUNTER — Emergency Department (HOSPITAL_BASED_OUTPATIENT_CLINIC_OR_DEPARTMENT_OTHER)
Admission: EM | Admit: 2015-04-05 | Discharge: 2015-04-05 | Disposition: A | Discharge status: Home / Self Care | Payer: Medicare Other | Attending: Emergency Medicine | Admitting: Emergency Medicine

## 2015-04-05 ENCOUNTER — Emergency Department (HOSPITAL_BASED_OUTPATIENT_CLINIC_OR_DEPARTMENT_OTHER): Payer: Medicare Other

## 2015-04-05 ENCOUNTER — Encounter (HOSPITAL_BASED_OUTPATIENT_CLINIC_OR_DEPARTMENT_OTHER): Payer: Self-pay | Admitting: *Deleted

## 2015-04-05 DIAGNOSIS — Z8669 Personal history of other diseases of the nervous system and sense organs: Secondary | ICD-10-CM | POA: Insufficient documentation

## 2015-04-05 DIAGNOSIS — Z87891 Personal history of nicotine dependence: Secondary | ICD-10-CM | POA: Insufficient documentation

## 2015-04-05 DIAGNOSIS — K8020 Calculus of gallbladder without cholecystitis without obstruction: Secondary | ICD-10-CM | POA: Insufficient documentation

## 2015-04-05 DIAGNOSIS — I509 Heart failure, unspecified: Secondary | ICD-10-CM | POA: Insufficient documentation

## 2015-04-05 DIAGNOSIS — K8050 Calculus of bile duct without cholangitis or cholecystitis without obstruction: Secondary | ICD-10-CM

## 2015-04-05 DIAGNOSIS — Z8659 Personal history of other mental and behavioral disorders: Secondary | ICD-10-CM | POA: Insufficient documentation

## 2015-04-05 DIAGNOSIS — Z91040 Latex allergy status: Secondary | ICD-10-CM | POA: Insufficient documentation

## 2015-04-05 DIAGNOSIS — R1011 Right upper quadrant pain: Secondary | ICD-10-CM

## 2015-04-05 LAB — CBC WITH DIFFERENTIAL/PLATELET
BASOS ABS: 0 10*3/uL (ref 0.0–0.1)
BASOS PCT: 0 % (ref 0–1)
EOS PCT: 3 % (ref 0–5)
Eosinophils Absolute: 0.2 10*3/uL (ref 0.0–0.7)
HEMATOCRIT: 45.6 % (ref 36.0–46.0)
Hemoglobin: 15.6 g/dL — ABNORMAL HIGH (ref 12.0–15.0)
Lymphocytes Relative: 23 % (ref 12–46)
Lymphs Abs: 1.6 10*3/uL (ref 0.7–4.0)
MCH: 28.9 pg (ref 26.0–34.0)
MCHC: 34.2 g/dL (ref 30.0–36.0)
MCV: 84.4 fL (ref 78.0–100.0)
Monocytes Absolute: 0.4 10*3/uL (ref 0.1–1.0)
Monocytes Relative: 6 % (ref 3–12)
NEUTROS ABS: 4.9 10*3/uL (ref 1.7–7.7)
Neutrophils Relative %: 68 % (ref 43–77)
PLATELETS: 92 10*3/uL — AB (ref 150–400)
RBC: 5.4 MIL/uL — ABNORMAL HIGH (ref 3.87–5.11)
RDW: 13.8 % (ref 11.5–15.5)
WBC: 7.2 10*3/uL (ref 4.0–10.5)

## 2015-04-05 LAB — COMPREHENSIVE METABOLIC PANEL
ALT: 22 U/L (ref 14–54)
AST: 27 U/L (ref 15–41)
Albumin: 3.5 g/dL (ref 3.5–5.0)
Alkaline Phosphatase: 109 U/L (ref 38–126)
Anion gap: 10 (ref 5–15)
BUN: 12 mg/dL (ref 6–20)
CO2: 25 mmol/L (ref 22–32)
Calcium: 9 mg/dL (ref 8.9–10.3)
Chloride: 107 mmol/L (ref 101–111)
Creatinine, Ser: 0.83 mg/dL (ref 0.44–1.00)
Glucose, Bld: 110 mg/dL — ABNORMAL HIGH (ref 65–99)
POTASSIUM: 3.6 mmol/L (ref 3.5–5.1)
Sodium: 142 mmol/L (ref 135–145)
Total Bilirubin: 0.8 mg/dL (ref 0.3–1.2)
Total Protein: 6.6 g/dL (ref 6.5–8.1)

## 2015-04-05 LAB — URINALYSIS, ROUTINE W REFLEX MICROSCOPIC
Bilirubin Urine: NEGATIVE
GLUCOSE, UA: NEGATIVE mg/dL
HGB URINE DIPSTICK: NEGATIVE
KETONES UR: NEGATIVE mg/dL
Leukocytes, UA: NEGATIVE
Nitrite: NEGATIVE
PH: 5.5 (ref 5.0–8.0)
Protein, ur: NEGATIVE mg/dL
Specific Gravity, Urine: 1.014 (ref 1.005–1.030)
Urobilinogen, UA: 0.2 mg/dL (ref 0.0–1.0)

## 2015-04-05 LAB — LIPASE, BLOOD: LIPASE: 20 U/L — AB (ref 22–51)

## 2015-04-05 MED ORDER — IOHEXOL 300 MG/ML  SOLN
25.0000 mL | Freq: Once | INTRAMUSCULAR | Status: AC | PRN
  Administered 2015-04-05: 25 mL via ORAL

## 2015-04-05 MED ORDER — IOHEXOL 300 MG/ML  SOLN
100.0000 mL | Freq: Once | INTRAMUSCULAR | Status: AC | PRN
  Administered 2015-04-05: 100 mL via INTRAVENOUS

## 2015-04-05 MED ORDER — OXYCODONE-ACETAMINOPHEN 5-325 MG PO TABS: 1.0000 | ORAL_TABLET | ORAL | Status: AC | PRN

## 2015-04-05 MED ORDER — ONDANSETRON HCL 4 MG PO TABS: 4.0000 mg | ORAL_TABLET | Freq: Four times a day (QID) | ORAL | Status: AC

## 2015-04-05 MED ORDER — HYDROMORPHONE HCL 1 MG/ML IJ SOLN
1.0000 mg | Freq: Once | INTRAMUSCULAR | Status: AC
  Administered 2015-04-05: 1 mg via INTRAVENOUS
  Filled 2015-04-05: qty 1

## 2015-04-05 NOTE — ED Notes (Signed)
Abdominal pain x 2 weeks. Sharp intermittent pain. States it feels like the pain she normally has with pancreatitis.

## 2015-04-05 NOTE — ED Provider Notes (Signed)
CSN: [id number]     Arrival date & time 04/05/15  1533 History  This chart was scribed for Debby Freiberg, MD by Meriel Pica, ED Scribe. This patient was seen in room MH01/MH01 and the patient's care was started 4:20 PM.   Chief Complaint  Patient presents with  . Abdominal Pain   Patient is a 54 y.o. female presenting with abdominal pain. The history is provided by the patient. No language interpreter was used.  Abdominal Pain Pain location:  RUQ Pain quality: sharp   Pain radiates to:  Does not radiate Pain severity:  Moderate Onset quality:  Gradual Duration:  2 weeks Timing:  Intermittent Progression:  Worsening Chronicity:  New Context comment:  Cholelithiasis  Relieved by:  Nothing Worsened by:  Eating Associated symptoms: nausea   Associated symptoms: no dysuria, no fever and no vomiting     HPI Comments: Danaly Bari is a 54 y.o. female, with a PMhx of pancreatitis, cholelithiasis, and acute liver failure, who presents to the Emergency Department complaining of gradually worsening, intermittent, sharp, RUQ abdominal pain onset 2 weeks ago. Pt associates nausea and an abnormal mucous appearance to her stool. Her abdominal pain is worse after meals. Pt notes she was diagnosed with cholelithiasis for which she was prescribed medication for but has not been taking this medication recently per her GI doctor.  Denies fever, vomiting, or dysuria.  Past Medical History  Diagnosis Date  . CHF (congestive heart failure)   . Anxiety   . Pancreatitis   . Cataracts, bilateral   . Non-cardiac chest pain 08/17/14    LHC: nl coronaries + nl LV function w/ EF of 55-60%  . Liver failure, acute 2008   Past Surgical History  Procedure Laterality Date  . Abdominal hysterectomy    . Left heart cath  08/17/14    LHC: nl coronaries + nl LV function w/ EF of 55-60%  . Left heart catheterization with coronary angiogram N/A 08/17/2014    Procedure: LEFT HEART CATHETERIZATION WITH CORONARY  ANGIOGRAM;  Surgeon: Blane Ohara, MD;  Location: [facility] CATH LAB;  Service: Cardiovascular;  Laterality: N/A;   No family history on file. Social History  Substance Use Topics  . Smoking status: Former Smoker    Types: Cigarettes  . Smokeless tobacco: Never Used  . Alcohol Use: No   OB History    No data available     Review of Systems  Constitutional: Negative for fever.  Gastrointestinal: Positive for nausea and abdominal pain. Negative for vomiting.  Genitourinary: Negative for dysuria.  All other systems reviewed and are negative.  Allergies  Latex  Home Medications   Prior to Admission medications   Medication Sig Start Date End Date Taking? Authorizing Provider  amoxicillin-clavulanate (AUGMENTIN) 875-125 MG per tablet Take 1 tablet by mouth every 12 (twelve) hours. 02/02/15   Hanna Patel-Mills, PA-C  HYDROcodone-acetaminophen (NORCO/VICODIN) 5-325 MG per tablet Take 2 tablets by mouth every 4 (four) hours as needed. Patient not taking: Reported on 10/21/2014 08/18/14   Ezequiel Essex, MD  Melatonin 5 MG TABS Take 5 mg by mouth at bedtime as needed (sleep).    Historical Provider, MD  omeprazole (PRILOSEC) 20 MG capsule Take 1 capsule (20 mg total) by mouth daily. Patient not taking: Reported on 10/21/2014 08/18/14   Ezequiel Essex, MD  ondansetron (ZOFRAN) 4 MG tablet Take 1 tablet (4 mg total) by mouth every 6 (six) hours. 04/05/15   Debby Freiberg, MD  oxyCODONE-acetaminophen (PERCOCET/ROXICET) 5-325 MG per  tablet Take 1-2 tablets by mouth every 4 (four) hours as needed. 04/05/15   Debby Freiberg, MD  Pseudoephedrine-Ibuprofen 30-200 MG TABS Take 1 tablet by mouth daily as needed (cold symptoms).    Historical Provider, MD  Valerian 100 MG CAPS Take 1 capsule by mouth daily as needed (anxiety).    Historical Provider, MD   BP 165/106 mmHg  Pulse 74  Temp(Src) 98.6 F (37 C) (Oral)  Resp 18  Ht 5\' 2"  (1.575 m)  Wt 244 lb (110.678 kg)  BMI 44.62 kg/m2  SpO2  97% Physical Exam  Constitutional: She is oriented to person, place, and time. She appears well-developed and well-nourished.  HENT:  Head: Normocephalic and atraumatic.  Right Ear: External ear normal.  Left Ear: External ear normal.  Eyes: Conjunctivae and EOM are normal. Pupils are equal, round, and reactive to light.  Neck: Normal range of motion. Neck supple.  Cardiovascular: Normal rate, regular rhythm, normal heart sounds and intact distal pulses.   Pulmonary/Chest: Effort normal and breath sounds normal.  Abdominal: Soft. Bowel sounds are normal. There is tenderness in the right upper quadrant and epigastric area. There is negative Murphy's sign.  Musculoskeletal: Normal range of motion.  Neurological: She is alert and oriented to person, place, and time.  Skin: Skin is warm and dry.  Vitals reviewed.   ED Course  Procedures  DIAGNOSTIC STUDIES: Oxygen Saturation is 97% on RA, normal by my interpretation.    COORDINATION OF CARE: 4:25 PM Discussed treatment plan with pt. Will order [place] of abdomen and diagnostic labs. Pt acknowledges and agrees to plan.   Labs Review Labs Reviewed  COMPREHENSIVE METABOLIC PANEL - Abnormal; Notable for the following:    Glucose, Bld 110 (*)    All other components within normal limits  LIPASE, BLOOD - Abnormal; Notable for the following:    Lipase 20 (*)    All other components within normal limits  CBC WITH DIFFERENTIAL/PLATELET - Abnormal; Notable for the following:    RBC 5.40 (*)    Hemoglobin 15.6 (*)    Platelets 92 (*)    All other components within normal limits  URINALYSIS, ROUTINE W REFLEX MICROSCOPIC (NOT AT [facility])    Imaging Review US Abdomen Complete  04/05/2015   CLINICAL DATA:  Right upper quadrant pain for 2 weeks. History of cholelithiasis, acute liver failure and pancreatitis as well as hepatitis and cirrhosis.  EXAM: ULTRASOUND ABDOMEN COMPLETE  COMPARISON:  CT, 08/17/2014.  FINDINGS: Gallbladder: Multiple dependent  mobile shadowing gallstones. No wall thickening or evidence of acute cholecystitis.  Common bile duct: Diameter: 6 mm  Liver: Liver it shows a coarsened echotexture as well as surface nodularity findings consistent with advanced cirrhosis. No discrete liver mass or lesion. Hepatopetal flow documented in the portal vein.  IVC: No abnormality visualized.  Pancreas: Visualized portion unremarkable.  Spleen: Enlarged measuring 18 cm.  No splenic mass or focal lesion.  Right Kidney: Length: 12 cm. Echogenicity within normal limits. No mass or hydronephrosis visualized.  Left Kidney: Length: 10.7 cm. Normal parenchymal echogenicity. Questionable 14 mm cyst versus dilated calyx in the upper pole. Based on the prior CT, this is most likely a small cyst. No other renal abnormality.  Abdominal aorta: No aneurysm visualized.  Other findings: None.  IMPRESSION: 1. No acute findings. 2. Cholelithiasis with no evidence of acute cholecystitis. 3. Advanced cirrhosis.  No sonographic evidence of a liver mass. 4. Small left renal cyst.   Electronically Signed   By: Shanon Brow  Ormond M.D.   On: 04/05/2015 18:00   Ct Abdomen Pelvis W Contrast  04/05/2015   CLINICAL DATA:  Right upper quadrant pain with nausea 2 weeks. Previous hysterectomy.  EXAM: CT ABDOMEN AND PELVIS WITH CONTRAST  TECHNIQUE: Multidetector CT imaging of the abdomen and pelvis was performed using the standard protocol following bolus administration of intravenous contrast.  CONTRAST:  62mL OMNIPAQUE IOHEXOL 300 MG/ML SOLN, 118mL OMNIPAQUE IOHEXOL 300 MG/ML SOLN  COMPARISON:  08/17/2014  FINDINGS: Lung bases are within normal.  Abdominal images demonstrate small nodular liver without focal mass compatible with known cirrhosis. There is borderline splenomegaly. Stable moderate cholelithiasis. The pancreas and adrenal glands are normal. Kidneys normal in size without hydronephrosis or nephrolithiasis. Ureters are normal.  Mild calcified plaque over the abdominal aorta and  iliac arteries.  Appendix is normal.  There is a small to moderate size umbilical hernia containing only peritoneal fat.  Mesentery is normal. There is no free fluid or focal inflammatory change.  Pelvic images demonstrate surgical absence of the uterus. Bladder, ovaries and rectum are normal. There is no free fluid. Remaining bones and soft tissues are unchanged.  IMPRESSION: No acute findings in the abdomen/ pelvis.  Evidence of known cirrhosis.  Borderline splenomegaly.  No ascites.  Moderate cholelithiasis.  Small to moderate size umbilical hernia containing only peritoneal fat.   Electronically Signed   By: Marin Olp M.D.   On: 04/05/2015 18:56      EKG Interpretation None      MDM   Final diagnoses:  Biliary colic    54 y.o. female with pertinent PMH of prior biliary colic and cholelithiasis, CHF, prior pancreatitis presents with right upper quadrant abdominal pain.  Pain present for a few days, worse with eating, historically consistent with biliary colic. On arrival vital signs and physical exam as above. Patient has no Percell Miller sign, however has prominent right upper quadrant tenderness. Workup obtained as above and demonstrated cholelithiasis without cholecystitis. CT scan subsequently obtained and unremarkable pancreatitis or other emergent pathology of the abdomen. Pain relieved with Dilaudid. Discharged home in stable condition to follow-up with both GI and surgery.    I have reviewed all laboratory and imaging studies if ordered as above  1. RUQ abdominal pain   2. Biliary colic           Debby Freiberg, MD 04/06/15 [id number]

## 2015-04-05 NOTE — Discharge Instructions (Signed)
Abdominal Pain  Many things can cause abdominal pain. Usually, abdominal pain is not caused by a disease and will improve without treatment. It can often be observed and treated at home. Your health care provider will do a physical exam and possibly order blood tests and X-rays to help determine the seriousness of your pain. However, in many cases, more time must pass before a clear cause of the pain can be found. Before that point, your health care provider may not know if you need more testing or further treatment.  HOME CARE INSTRUCTIONS   Monitor your abdominal pain for any changes. The following actions may help to alleviate any discomfort you are experiencing:   Only take over-the-counter or prescription medicines as directed by your health care provider.   Do not take laxatives unless directed to do so by your health care provider.   Try a clear liquid diet (broth, tea, or water) as directed by your health care provider. Slowly move to a bland diet as tolerated.  SEEK MEDICAL CARE IF:   You have unexplained abdominal pain.   You have abdominal pain associated with nausea or diarrhea.   You have pain when you urinate or have a bowel movement.   You experience abdominal pain that wakes you in the night.   You have abdominal pain that is worsened or improved by eating food.   You have abdominal pain that is worsened with eating fatty foods.   You have a fever.  SEEK IMMEDIATE MEDICAL CARE IF:    Your pain does not go away within 2 hours.   You keep throwing up (vomiting).   Your pain is felt only in portions of the abdomen, such as the right side or the left lower portion of the abdomen.   You pass bloody or black tarry stools.  MAKE SURE YOU:   Understand these instructions.    Will watch your condition.    Will get help right away if you are not doing well or get worse.   Document Released: 05/17/2005 Document Revised: 08/12/2013 Document Reviewed: 04/16/2013  [employer] Patient Information  2015 [employer]. This information is not intended to replace advice given to you by your health care provider. Make sure you discuss any questions you have with your health care provider.          Biliary Colic   Biliary colic is a steady or irregular pain in the upper abdomen. It is usually under the right side of the rib cage. It happens when gallstones interfere with the normal flow of bile from the gallbladder. Bile is a liquid that helps to digest fats. Bile is made in the liver and stored in the gallbladder. When you eat a meal, bile passes from the gallbladder through the cystic duct and the common bile duct into the small intestine. There, it mixes with partially digested food. If a gallstone blocks either of these ducts, the normal flow of bile is blocked. The muscle cells in the bile duct contract forcefully to try to move the stone. This causes the pain of biliary colic.   SYMPTOMS    A person with biliary colic usually complains of pain in the upper abdomen. This pain can be:   In the center of the upper abdomen just below the breastbone.   In the upper-right part of the abdomen, near the gallbladder and liver.   Spread back toward the right shoulder blade.   Nausea and vomiting.   The pain   usually occurs after eating.   Biliary colic is usually triggered by the digestive system's demand for bile. The demand for bile is high after fatty meals. Symptoms can also occur when a person who has been fasting suddenly eats a very large meal. Most episodes of biliary colic pass after 1 to 5 hours. After the most intense pain passes, your abdomen may continue to ache mildly for about 24 hours.  DIAGNOSIS   After you describe your symptoms, your caregiver will perform a physical exam. He or she will pay attention to the upper right portion of your belly (abdomen). This is the area of your liver and gallbladder. An ultrasound will help your caregiver look for gallstones. Specialized scans of the  gallbladder may also be done. Blood tests may be done, especially if you have fever or if your pain persists.  PREVENTION   Biliary colic can be prevented by controlling the risk factors for gallstones. Some of these risk factors, such as heredity, increasing age, and pregnancy are a normal part of life. Obesity and a high-fat diet are risk factors you can change through a healthy lifestyle. Women going through menopause who take hormone replacement therapy (estrogen) are also more likely to develop biliary colic.  TREATMENT    Pain medication may be prescribed.   You may be encouraged to eat a fat-free diet.   If the first episode of biliary colic is severe, or episodes of colic keep retuning, surgery to remove the gallbladder (cholecystectomy) is usually recommended. This procedure can be done through small incisions using an instrument called a laparoscope. The procedure often requires a brief stay in the hospital. Some people can leave the hospital the same day. It is the most widely used treatment in people troubled by painful gallstones. It is effective and safe, with no complications in more than 90% of cases.   If surgery cannot be done, medication that dissolves gallstones may be used. This medication is expensive and can take months or years to work. Only small stones will dissolve.   Rarely, medication to dissolve gallstones is combined with a procedure called shock-wave lithotripsy. This procedure uses carefully aimed shock waves to break up gallstones. In many people treated with this procedure, gallstones form again within a few years.  PROGNOSIS   If gallstones block your cystic duct or common bile duct, you are at risk for repeated episodes of biliary colic. There is also a 25% chance that you will develop a gallbladder infection(acute cholecystitis), or some other complication of gallstones within 10 to 20 years. If you have surgery, schedule it at a time that is convenient for you and at a  time when you are not sick.  HOME CARE INSTRUCTIONS    Drink plenty of clear fluids.   Avoid fatty, greasy or fried foods, or any foods that make your pain worse.   Take medications as directed.  SEEK MEDICAL CARE IF:    You develop a fever over 100.5 F (38.1 C).   Your pain gets worse over time.   You develop nausea that prevents you from eating and drinking.   You develop vomiting.  SEEK IMMEDIATE MEDICAL CARE IF:    You have continuous or severe belly (abdominal) pain which is not relieved with medications.   You develop nausea and vomiting which is not relieved with medications.   You have symptoms of biliary colic and you suddenly develop a fever and shaking chills. This may signal cholecystitis. Call your caregiver   immediately.   You develop a yellow color to your skin or the white part of your eyes (jaundice).  Document Released: 01/08/2006 Document Revised: 10/30/2011 Document Reviewed: 03/19/2008  [employer] Patient Information 2015 [employer]. This information is not intended to replace advice given to you by your health care provider. Make sure you discuss any questions you have with your health care provider.

## 2015-04-10 ENCOUNTER — Emergency Department (HOSPITAL_COMMUNITY)
Admission: EM | Admit: 2015-04-10 | Discharge: 2015-04-10 | Disposition: A | Discharge status: Home / Self Care | Payer: Medicare Other | Attending: Emergency Medicine | Admitting: Emergency Medicine

## 2015-04-10 ENCOUNTER — Emergency Department (HOSPITAL_COMMUNITY): Payer: Medicare Other

## 2015-04-10 ENCOUNTER — Encounter (HOSPITAL_COMMUNITY): Payer: Self-pay | Admitting: *Deleted

## 2015-04-10 DIAGNOSIS — R002 Palpitations: Secondary | ICD-10-CM | POA: Insufficient documentation

## 2015-04-10 DIAGNOSIS — R110 Nausea: Secondary | ICD-10-CM | POA: Insufficient documentation

## 2015-04-10 DIAGNOSIS — R0789 Other chest pain: Secondary | ICD-10-CM

## 2015-04-10 DIAGNOSIS — F419 Anxiety disorder, unspecified: Secondary | ICD-10-CM

## 2015-04-10 DIAGNOSIS — Z9889 Other specified postprocedural states: Secondary | ICD-10-CM | POA: Insufficient documentation

## 2015-04-10 DIAGNOSIS — Z87891 Personal history of nicotine dependence: Secondary | ICD-10-CM | POA: Insufficient documentation

## 2015-04-10 DIAGNOSIS — Z8669 Personal history of other diseases of the nervous system and sense organs: Secondary | ICD-10-CM | POA: Insufficient documentation

## 2015-04-10 DIAGNOSIS — Z79899 Other long term (current) drug therapy: Secondary | ICD-10-CM | POA: Insufficient documentation

## 2015-04-10 DIAGNOSIS — Z8719 Personal history of other diseases of the digestive system: Secondary | ICD-10-CM | POA: Insufficient documentation

## 2015-04-10 DIAGNOSIS — I509 Heart failure, unspecified: Secondary | ICD-10-CM | POA: Insufficient documentation

## 2015-04-10 DIAGNOSIS — R109 Unspecified abdominal pain: Secondary | ICD-10-CM | POA: Insufficient documentation

## 2015-04-10 LAB — CBC WITH DIFFERENTIAL/PLATELET
BASOS PCT: 0 % (ref 0–1)
Basophils Absolute: 0 10*3/uL (ref 0.0–0.1)
Eosinophils Absolute: 0.3 10*3/uL (ref 0.0–0.7)
Eosinophils Relative: 4 % (ref 0–5)
HCT: 44.6 % (ref 36.0–46.0)
HEMOGLOBIN: 15.4 g/dL — AB (ref 12.0–15.0)
Lymphocytes Relative: 25 % (ref 12–46)
Lymphs Abs: 1.9 10*3/uL (ref 0.7–4.0)
MCH: 30 pg (ref 26.0–34.0)
MCHC: 34.5 g/dL (ref 30.0–36.0)
MCV: 86.8 fL (ref 78.0–100.0)
Monocytes Absolute: 0.4 10*3/uL (ref 0.1–1.0)
Monocytes Relative: 6 % (ref 3–12)
NEUTROS ABS: 4.8 10*3/uL (ref 1.7–7.7)
NEUTROS PCT: 65 % (ref 43–77)
Platelets: 81 10*3/uL — ABNORMAL LOW (ref 150–400)
RBC: 5.14 MIL/uL — ABNORMAL HIGH (ref 3.87–5.11)
RDW: 13.8 % (ref 11.5–15.5)
WBC: 7.3 10*3/uL (ref 4.0–10.5)

## 2015-04-10 LAB — COMPREHENSIVE METABOLIC PANEL
ALBUMIN: 3.2 g/dL — AB (ref 3.5–5.0)
ALT: 19 U/L (ref 14–54)
AST: 28 U/L (ref 15–41)
Alkaline Phosphatase: 100 U/L (ref 38–126)
Anion gap: 9 (ref 5–15)
BUN: 9 mg/dL (ref 6–20)
CALCIUM: 9.1 mg/dL (ref 8.9–10.3)
CO2: 25 mmol/L (ref 22–32)
Chloride: 106 mmol/L (ref 101–111)
Creatinine, Ser: 0.94 mg/dL (ref 0.44–1.00)
GFR calc Af Amer: >60 mL/min (ref >60)
GFR calc non Af Amer: >60 mL/min (ref >60)
GLUCOSE: 112 mg/dL — AB (ref 65–99)
POTASSIUM: 3.5 mmol/L (ref 3.5–5.1)
Sodium: 140 mmol/L (ref 135–145)
Total Bilirubin: 0.7 mg/dL (ref 0.3–1.2)
Total Protein: 6 g/dL — ABNORMAL LOW (ref 6.5–8.1)

## 2015-04-10 LAB — I-STAT TROPONIN, ED
TROPONIN I, POC: 0 ng/mL (ref 0.00–0.08)
TROPONIN I, POC: 0 ng/mL (ref 0.00–0.08)

## 2015-04-10 LAB — LIPASE, BLOOD: LIPASE: 24 U/L (ref 22–51)

## 2015-04-10 LAB — BRAIN NATRIURETIC PEPTIDE: B NATRIURETIC PEPTIDE 5: 16.3 pg/mL (ref 0.0–100.0)

## 2015-04-10 MED ORDER — KETOROLAC TROMETHAMINE 30 MG/ML IJ SOLN
30.0000 mg | Freq: Once | INTRAMUSCULAR | Status: AC
  Administered 2015-04-10: 30 mg via INTRAVENOUS
  Filled 2015-04-10: qty 1

## 2015-04-10 MED ORDER — ONDANSETRON HCL 4 MG/2ML IJ SOLN
4.0000 mg | Freq: Once | INTRAMUSCULAR | Status: AC
  Administered 2015-04-10: 4 mg via INTRAVENOUS
  Filled 2015-04-10: qty 2

## 2015-04-10 MED ORDER — MORPHINE SULFATE (PF) 4 MG/ML IV SOLN
4.0000 mg | Freq: Once | INTRAVENOUS | Status: AC
  Administered 2015-04-10: 4 mg via INTRAVENOUS
  Filled 2015-04-10: qty 1

## 2015-04-10 MED ORDER — HYDROXYZINE HCL 25 MG PO TABS: 25.0000 mg | ORAL_TABLET | Freq: Four times a day (QID) | ORAL | Status: AC

## 2015-04-10 MED ORDER — HYDROXYZINE HCL 25 MG PO TABS
50.0000 mg | ORAL_TABLET | Freq: Once | ORAL | Status: AC
  Administered 2015-04-10: 50 mg via ORAL
  Filled 2015-04-10: qty 2

## 2015-04-10 NOTE — ED Notes (Signed)
Pt reported to EMS she had palpitations and  LT sided CP. Pt had nausea and took zofran at home. Pt took 4 baby ASA  Per instruction of 911 operator . Pt was given 2 nitro SL with no relief in pain.

## 2015-04-10 NOTE — ED Notes (Signed)
Patient transported to X-ray 

## 2015-04-10 NOTE — ED Provider Notes (Signed)
CSN: [id number]     Arrival date & time 04/10/15  0952 History   First MD Initiated Contact with Patient 04/10/15 [phone]     Chief Complaint  Patient presents with  . Chest Pain     (Consider location/radiation/quality/duration/timing/severity/associated sxs/prior Treatment) HPI   Blood pressure 135/81, pulse 55, temperature 97.5 F (36.4 C), temperature source Oral, resp. rate 20, height 5\' 2"  (1.575 m), weight 244 lb (110.678 kg), SpO2 96 %.  Cassandra Rivera is a 54 y.o. female complaining of sensation of her heart pounding associated with nausea onset at 2 AM last night. Patient states that this is consistent with prior panic and anxiety exacerbations. She tried journaling and meditating with little relief. 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 Chest pain started 45 minutes ago while in route via ambulance to the ED. She describes it as left-sided, radiating to the back and bilateral shoulders, rates it a 4 out of 10 and describes it as pressure like it is not associated with diuresis diaphoresis, shortness of breath. Patient was seen earlier in the week and diagnosed with biliary colic. She also endorses a mild generalized abdominal pain with no fever, emesis, diarrhea, cough, increasing peripheral edema, calf pain, orthopnea, PND. Patient took full dose aspirin as instructed by 911. She was given nitroglycerin in route with little relief. Patient had clean cath in 2015.   Past Medical History  Diagnosis Date  . CHF (congestive heart failure)   . Anxiety   . Pancreatitis   . Cataracts, bilateral   . Non-cardiac chest pain 08/17/14    LHC: nl coronaries + nl LV function w/ EF of 55-60%  . Liver failure, acute 2008   Past Surgical History  Procedure Laterality Date  . Abdominal hysterectomy    . Left heart cath  08/17/14    LHC: nl  coronaries + nl LV function w/ EF of 55-60%  . Left heart catheterization with coronary angiogram N/A 08/17/2014    Procedure: LEFT HEART CATHETERIZATION WITH CORONARY ANGIOGRAM;  Surgeon: Blane Ohara, MD;  Location: [facility] CATH LAB;  Service: Cardiovascular;  Laterality: N/A;   History reviewed. No pertinent family history. Social History  Substance Use Topics  . Smoking status: Former Smoker    Types: Cigarettes  . Smokeless tobacco: Never Used  . Alcohol Use: No   OB History    No data available     Review of Systems  10 systems reviewed and found to be negative, except as noted in the HPI.   Allergies  Latex  Home Medications   Prior to Admission medications   Medication Sig Start Date End Date Taking? Authorizing Provider  Melatonin 5 MG TABS Take 5 mg by mouth at bedtime as needed (sleep).    Historical Provider, MD  ondansetron (ZOFRAN) 4 MG tablet Take 1 tablet (4 mg total) by mouth every 6 (six) hours. 04/05/15   Debby Freiberg, MD  oxyCODONE-acetaminophen (PERCOCET/ROXICET) 5-325 MG per tablet Take 1-2 tablets by mouth every 4 (four) hours as needed. 04/05/15   Debby Freiberg, MD  Pseudoephedrine-Ibuprofen 30-200 MG TABS Take 1 tablet by mouth daily as needed (cold symptoms).    Historical Provider, MD  Valerian 100 MG CAPS Take 1 capsule by mouth daily as needed (anxiety).    Historical Provider, MD   BP 135/81 mmHg  Pulse 55  Temp(Src) 97.5  F (36.4 C) (Oral)  Resp 20  Ht 5\' 2"  (1.575 m)  Wt 244 lb (110.678 kg)  BMI 44.62 kg/m2  SpO2 96% Physical Exam  Constitutional: She is oriented to person, place, and time. She appears well-developed and well-nourished. No distress.  HENT:  Head: Normocephalic.  Mouth/Throat: Oropharynx is clear and moist.  Eyes: Conjunctivae are normal.  Neck: Normal range of motion. No JVD present. No tracheal deviation present.  Cardiovascular: Normal rate, regular rhythm and intact distal pulses.   Radial pulse equal bilaterally   Pulmonary/Chest: Effort normal and breath sounds normal. No stridor. No respiratory distress. She has no wheezes. She has no rales. She exhibits no tenderness.  Abdominal: Soft. She exhibits no distension and no mass. There is tenderness. There is no rebound and no guarding.  Mild, diffuse tenderness to palpation with no guarding or rebound.  Murphy sign negative, no tenderness to palpation over McBurney's point, Rovsings, Psoas and obturator all negative.   Musculoskeletal: Normal range of motion. She exhibits no edema or tenderness.  No calf asymmetry, superficial collaterals, palpable cords, edema, Homans sign negative bilaterally.    Neurological: She is alert and oriented to person, place, and time.  Skin: Skin is warm. She is not diaphoretic.  Psychiatric: She has a normal mood and affect.  Nursing note and vitals reviewed.   ED Course  Procedures (including critical care time) Labs Review Labs Reviewed  CBC WITH DIFFERENTIAL/PLATELET - Abnormal; Notable for the following:    RBC 5.14 (*)    Hemoglobin 15.4 (*)    Platelets 81 (*)    All other components within normal limits  COMPREHENSIVE METABOLIC PANEL - Abnormal; Notable for the following:    Glucose, Bld 112 (*)    Total Protein 6.0 (*)    Albumin 3.2 (*)    All other components within normal limits  LIPASE, BLOOD  BRAIN NATRIURETIC PEPTIDE  I-STAT TROPOININ, ED  Randolm Idol, ED    Imaging Review Dg Chest 2 View  04/10/2015   CLINICAL DATA:  Chest pain. Bilateral chest pain. Initial encounter.  EXAM: CHEST  2 VIEW  COMPARISON:  Chest CT 08/15/2014.  Chest radiograph 08/14/2014.  FINDINGS: Blunting of both costophrenic angles is present on the lateral projection compatible with small bilateral pleural effusions. Cardiopericardial silhouette is mildly enlarged for projection. There is no airspace disease. Cephalization of pulmonary blood flow. No focal consolidation.  IMPRESSION: Mild cardiomegaly, pulmonary  vascular congestion and tiny bilateral pleural effusions compatible with volume overload/ CHF.   Electronically Signed   By: Dereck Ligas M.D.   On: 04/10/2015 11:44   I have personally reviewed and evaluated these images and lab results as part of my medical decision-making.   EKG Interpretation   Date/Time:  Saturday April 10 2015 10:06:23 EDT Ventricular Rate:  55 PR Interval:  149 QRS Duration: 104 QT Interval:  493 QTC Calculation: 472 R Axis:   65 Text Interpretation:  Sinus rhythm Abnormal T, consider ischemia, anterior  leads No significant change since last tracing Confirmed by YAO  MD, DAVID  ([id number]) on 04/10/2015 10:16:39 AM      MDM   Final diagnoses:  Anxiety  Atypical chest pain   Filed Vitals:   04/10/15 1007  BP: 135/81  Pulse: 55  Temp: 97.5 F (36.4 C)  TempSrc: Oral  Resp: 20  Height: 5\' 2"  (1.575 m)  Weight: 244 lb (110.678 kg)  SpO2: 96%    Medications  hydrOXYzine (ATARAX/VISTARIL) tablet 50 mg (not  administered)  ketorolac (TORADOL) 30 MG/ML injection 30 mg (not administered)    Cassandra Rivera is a pleasant 54 y.o. female presenting with chest pain, palpitations associated with nausea. EKG unchanged, patient clean cath in 2015. Will do cardiac and abdominal workup. Patient given Atarax for anxiety and Toradol for pain control.  Chest x-ray with pulmonary vascular congestion, troponin negative blood work unremarkable. I feel this is likely more related to her anxiety.  Evaluation does not show pathology that would require ongoing emergent intervention or inpatient treatment. Pt is hemodynamically stable and mentating appropriately. Discussed findings and plan with patient/guardian, who agrees with care plan. All questions answered. Return precautions discussed and outpatient follow up given.   Discharge Medication List as of 04/10/2015  3:15 PM    START taking these medications   Details  hydrOXYzine (ATARAX/VISTARIL) 25 MG tablet Take 1  tablet (25 mg total) by mouth every 6 (six) hours., Starting 04/10/2015, Until Discontinued, Print             Monico Blitz, PA-C 04/10/15 1645  Wandra Arthurs, MD 04/10/15 [phone]

## 2015-04-10 NOTE — Discharge Instructions (Signed)
Do not hesitate to return to the emergency room for any new, worsening or concerning symptoms. ° °Please obtain primary care using resource guide below. Let them know that you were seen in the emergency room and that they will need to obtain records for further outpatient management. ° ° ° °Emergency Department Resource Guide °1) Find a Doctor and Pay Out of Pocket °Although you won't have to find out who is covered by your insurance plan, it is a good idea to ask around and get recommendations. You will then need to call the office and see if the doctor you have chosen will accept you as a new patient and what types of options they offer for patients who are self-pay. Some doctors offer discounts or will set up payment plans for their patients who do not have insurance, but you will need to ask so you aren't surprised when you get to your appointment. ° °2) Contact Your Local Health Department °Not all health departments have doctors that can see patients for sick visits, but many do, so it is worth a call to see if yours does. If you don't know where your local health department is, you can check in your phone book. The CDC also has a tool to help you locate your state's health department, and many state websites also have listings of all of their local health departments. ° °3) Find a Walk-in Clinic °If your illness is not likely to be very severe or complicated, you may want to try a walk in clinic. These are popping up all over the country in pharmacies, drugstores, and shopping centers. They're usually staffed by nurse practitioners or physician assistants that have been trained to treat common illnesses and complaints. They're usually fairly quick and inexpensive. However, if you have serious medical issues or chronic medical problems, these are probably not your best option. ° °No Primary Care Doctor: °- Call [facility] at  832-8000 - they can help you locate a primary care doctor that  accepts your  insurance, provides certain services, etc. °- Physician Referral Service- 1-800-533-3463 ° °Chronic Pain Problems: °Organization         Address  Phone   Notes  ° [facility]  (336) 297-2271 Patients need to be referred by their primary care doctor.  ° °Medication Assistance: °Organization         Address  Phone   Notes  °[place] [facility] [address] °[place], [place] [address] (336) 641-8030 --Must be a resident of [place] °-- Must have NO insurance coverage whatsoever (no Medicaid/ Medicare, etc.) °-- The pt. MUST have a primary care doctor that directs their care regularly and follows them in the community °  °[facility]  (866) 331-1348   °[employer]  (888) 892-1162   ° °Agencies that provide inexpensive medical care: °Organization         Address  Phone   Notes  °[facility]  (336) 832-8035   °[facility]    (336) 832-7272   °[facility] [address] °[place], [place] [address] (336) 832-4777   °[facility] [address], °[place] (336) 271-4999   °[facility]    (336) 373-0678   °[facility]    (336) 272-1050   °[facility] ° [address], [place] Phone:  (336) 832-4444, Fax:  (336) 832-4440 Hours of Operation:  9 am -   6 pm, M-F.  Also accepts Medicaid/Medicare and self-pay.  °[facility] ° [address], [place] Phone: (336) 832-3150, Fax: (336) [phone]. Hours of Operation:  8:30 am - 5:30 pm, M-F.  Also accepts Medicaid and self-pay.  °[facility] [place] [address], [place] Phone: (336) 878-6027   °[facility] [address], [place], [place] (336)723-1848, Ext. 123 Mondays & Thursdays: 7-9 AM.  First 15 patients are seen on a first come, first serve basis. °  ° °Medicaid-accepting [place] Providers: ° °Organization          Address  Phone   Notes  °[facility] [address], [place] (336) 641-2100 Also accepts self-pay patients.  °[facility] [address], [place] ° (336) 856-9996   °[facility] [address], [place] (336) 288-8857   °[facility] [address] [place] [address], [place] (336) 299-7000   °Veita Bland [address], [place]  ° (336) 373-1557 Only accepts [place] Access Medicaid patients after they have their name applied to their card.  ° °Self-Pay (no insurance) in [place]: ° °Organization         Address  Phone   Notes  °Sickle Cell Patients, [facility] [address], [place] (336) 832-1970   °[facility] [address], [place] (336) 832-4400   ° [facility] ° [address], [place] (336) 992-4800   °[facility]/Dr. Osei-Bonsu ° [address] [place] [address], [place] or [address], [place] (336) 841-8500 Phone number for both [place] and [place] locations is the same.  °[facility] [address], [place] (336) 299-0000   °[facility] [address] [place] [address], [place] or [address] (336) 852-7530 °(336) 878-2260   °[facility] [address], [place] (336) 350-1642, phone; (336) 294-5005, fax Sees patients 1st and 3rd Saturday of every month.  Must not qualify for public or private insurance (i.e. Medicaid, Medicare, [place] Health Choice, Veterans' Benefits) • Household income should be no more than 200% of the poverty level •The clinic cannot treat you if you are pregnant or think you are pregnant • Sexually transmitted diseases are not treated at the clinic.  ° ° °Dental Care: °Organization         Address  Phone  Notes  °[facility] [address],  [place] (336) 641-6152 Accepts children up to age 21 who are enrolled in Medicaid or [place] Health Choice; pregnant women with a Medicaid card; and children who have applied for Medicaid or [place] Health Choice, but were declined, whose parents can pay a reduced fee at time of service.  °[facility] [place]  [address], [place] (336) 641-7733 Accepts children up to age 21 who are enrolled in Medicaid or [place] Health Choice; pregnant women with a Medicaid card; and children who have applied for Medicaid or [place] Health Choice, but were declined, whose parents can pay a reduced fee at time of service.  °[facility] ° [address], [place] (336) 641-4533 Patients are seen by appointment only. Walk-ins are not accepted. [facility] will see patients 18 years of age and   older. °Monday - Tuesday (8am-5pm) °Most Wednesdays (8:30-5pm) °$30 per visit, cash only  °[facility] ° [address], [place] (336) 641-4533 Patients are seen by appointment only. Walk-ins are not accepted. [facility] will see patients 18 years of age and older. °One Wednesday Evening (Monthly: Volunteer Based).  $30 per visit, cash only  °[facility]  (919) 537-3737 for adults; Children under age 4, call Graduate Pediatric Dentistry at ([phone]) [phone]. Children aged 4-14, please call ([phone]) [phone] to request a pediatric application. ° Dental services are provided in all areas of dental care including fillings, crowns and bridges, complete and partial dentures, implants, gum treatment, root canals, and extractions. Preventive care is also provided. Treatment is provided to both adults and children. °Patients are selected via a lottery and there is often a waiting list. °  °[facility] [address], °[place] ° (336) 763-8833 [email or web address] °  °[facility] [address], [place], [place]  (336)723-1848, Ext. 123 Second and Fourth Thursday of each month, opens at 6:30 AM; Clinic ends at 9 AM.  Patients are seen on a first-come first-served basis, and a limited number are seen during each clinic.  ° °[facility] ° [address], [place], [place] (336) 723-7904   Eligibility Requirements °You must have lived in [place], [place], or [place] counties for at least the last three months. °  You cannot be eligible for state or federal sponsored healthcare insurance, including Veterans Administration, Medicaid, or Medicare. °  You generally cannot be eligible for healthcare insurance through your employer.  °  How to apply: °Eligibility screenings are held every Tuesday and Wednesday afternoon from 1:00 pm until 4:00 pm. You do not need an appointment for the interview!  °[facility] [address], [place], [place] 336-631-2330   °[facility]  336-342-8273   °[facility]  336-703-3100   °[facility]  336-570-6415   ° °Behavioral Health Resources in the Community: °Intensive Outpatient Programs °Organization         Address  Phone  Notes  °[facility] [address], [place], [place] 336-878-6098   °[facility] [address], [place], [place] 336-832-9800   °[facility] [address], [place], [place] ° 336-882-2125   °[facility] [address],  °[place], [place] 1-800-853-5163 or 336-641-4981   °Substance Abuse Resources °Organization         Address  Phone  Notes  °[facility]  336-882-2125   °[facility]  336-784-9470   °[facility]  336-285-9073   °[facility]  336-845-3988   °Residential & Outpatient Substance Abuse Program  1-800-659-3381   °Psychological Services °Organization         Address  Phone  Notes  °[facility]  336- 832-9600   °[facility]  336- 378-7881    °[facility] [address], [place] 1-800-853-5163 or 336-641-4981   ° °Mobile Crisis Teams °Organization         Address  Phone  Notes  °[facility], Mobile Crisis Care Unit  1-877-626-1772   °Assertive °[facility] ° [address] [place], [place] 336-834-9664   °Sharon DeEsch [address] °[place]  336-554-5454   ° °Self-Help/Support Groups °Organization         Address    Phone             Notes  °[facility] - variety of support groups  336- 373-1402 Call for more information  °Narcotics Anonymous (NA), [facility] [address], °[place]  2 meetings at this location  ° °Residential Treatment Programs °Organization         Address  Phone  Notes  °[facility] [address],    °[place]  1-866-801-8205   °[facility] ° [address], [place], [place] 704-293-8524   °[facility] [address], [place] 336-845-3988 Admissions: 8am-3pm M-F  °[facility] [address]    °[place], [place] 336-841-1104   °[facility] [address], [place], [place] 336-379-7146   °[facility] [address]  °[place], [place] 336-285-9073   °[facility] - Intensive Outpatient [address], [place], [place] 336-852-3033   °[facility] [address]  °[place], [place] 1-877-615-2722 or 336-784-9470   °[facility] [address] [place], [place] 336-227-7417 Accepts Medicaid  °[facility] [address]  ° [place] 1-800-659-3381 Substance Abuse/Addiction Treatment  ° °[place] Behavioral Health Resources °Organization         Address  Phone  Notes  °[facility]  (888) 581-9988   °Julie Brannon, PhD [address] [place], [place]   (336) 349-5553 or (336) 951-0000   °[facility]   [address] °[place], [place] (336) 349-4454   °[facility] [address], [place], [place] (336) 342-8316 Insurance/Medicaid/sponsorship through [facility]  °[facility] [address]                                    [place], [place] (336) 342-8316 Therapy/tele-psych/case  °[facility] [address]  ° [place], [place] (336) 349-2233    °Dr. Arfeen  (336) 349-4544   °[facility] [place]  [employer] [place] [facility] 1) [address], [place] °2) [address], [place] °3)  [address], [place] (336) 349-3220 °(336) 342-7768 ° °(336) 342-8140   °[place] [facility] (336) 342-1394 or (336) 342-3537 (After Hours)    ° ° ° °

## 2016-01-17 IMAGING — CT CT ABD-PELV W/ CM
2 of 5 series · 9 of 46 positions shown, 10 images · IV contrast (omnipaque)
Comparison: None.

CLINICAL DATA: Epigastric pain, status post cardiac catheterization
this morning

EXAM:
CT ABDOMEN AND PELVIS WITH CONTRAST
TECHNIQUE: Multidetector CT imaging of the abdomen and pelvis was performed
using the standard protocol following bolus administration of
intravenous contrast.
CONTRAST:  100mL OMNIPAQUE IOHEXOL 300 MG/ML  SOLN

[Series 201: routine, idose (2) · axial · 0.98mm/px · z∈[-502,-137]mm · 6 of 93 slices shown, 7 images]
[im 10/93  soft-tissue]
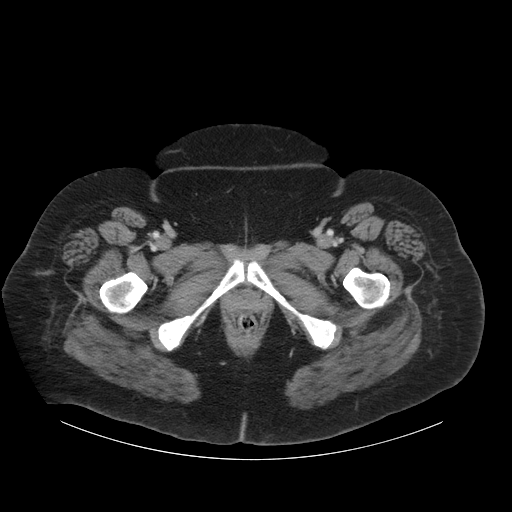
[im 10/93  bone]
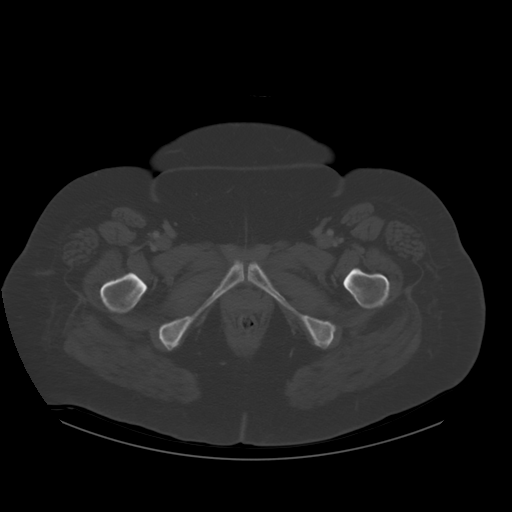
[im 24/93  soft-tissue]
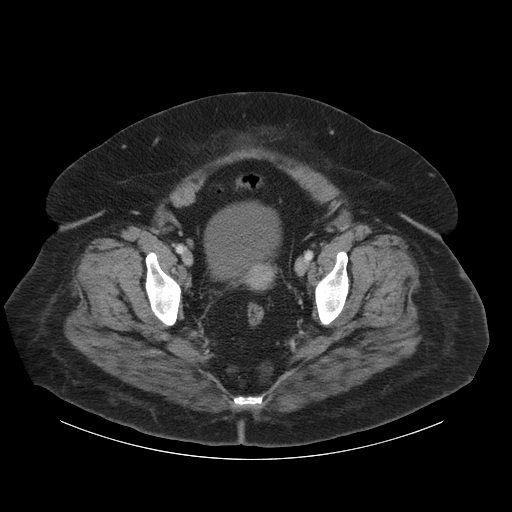
[im 37/93  soft-tissue]
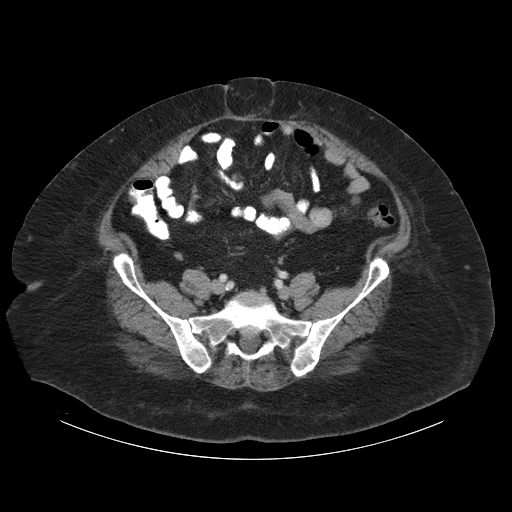
[im 56/93  soft-tissue]
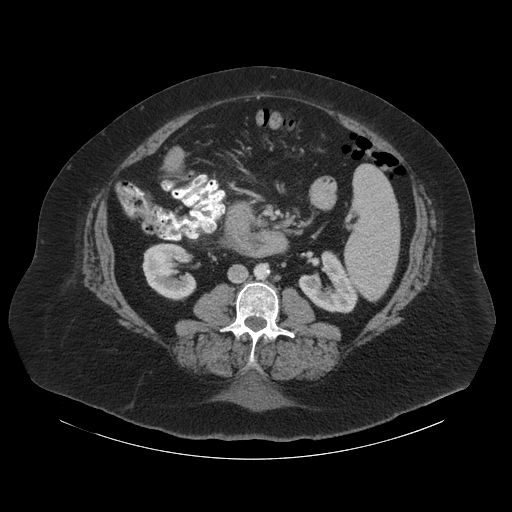
[im 70/93  soft-tissue]
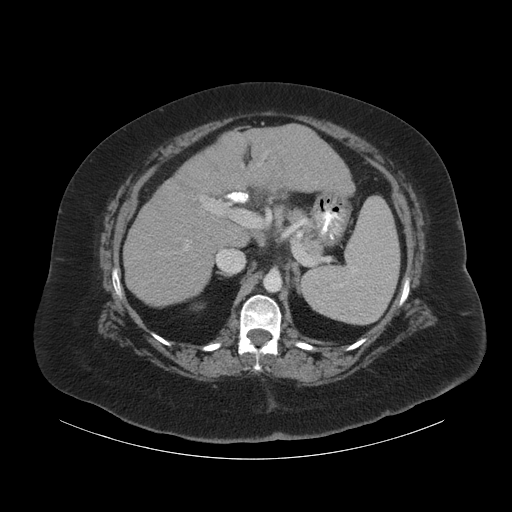
[im 83/93  soft-tissue]
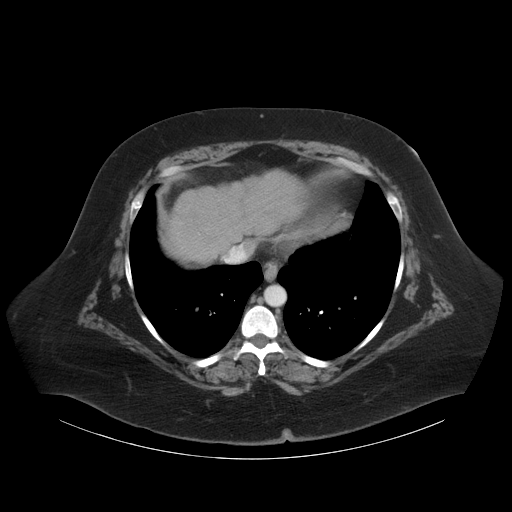

[Series 202: coronals, idose (2) · coronal · 0.45mm/px · 3 of 146 slices shown]
[im 49/146  soft-tissue]
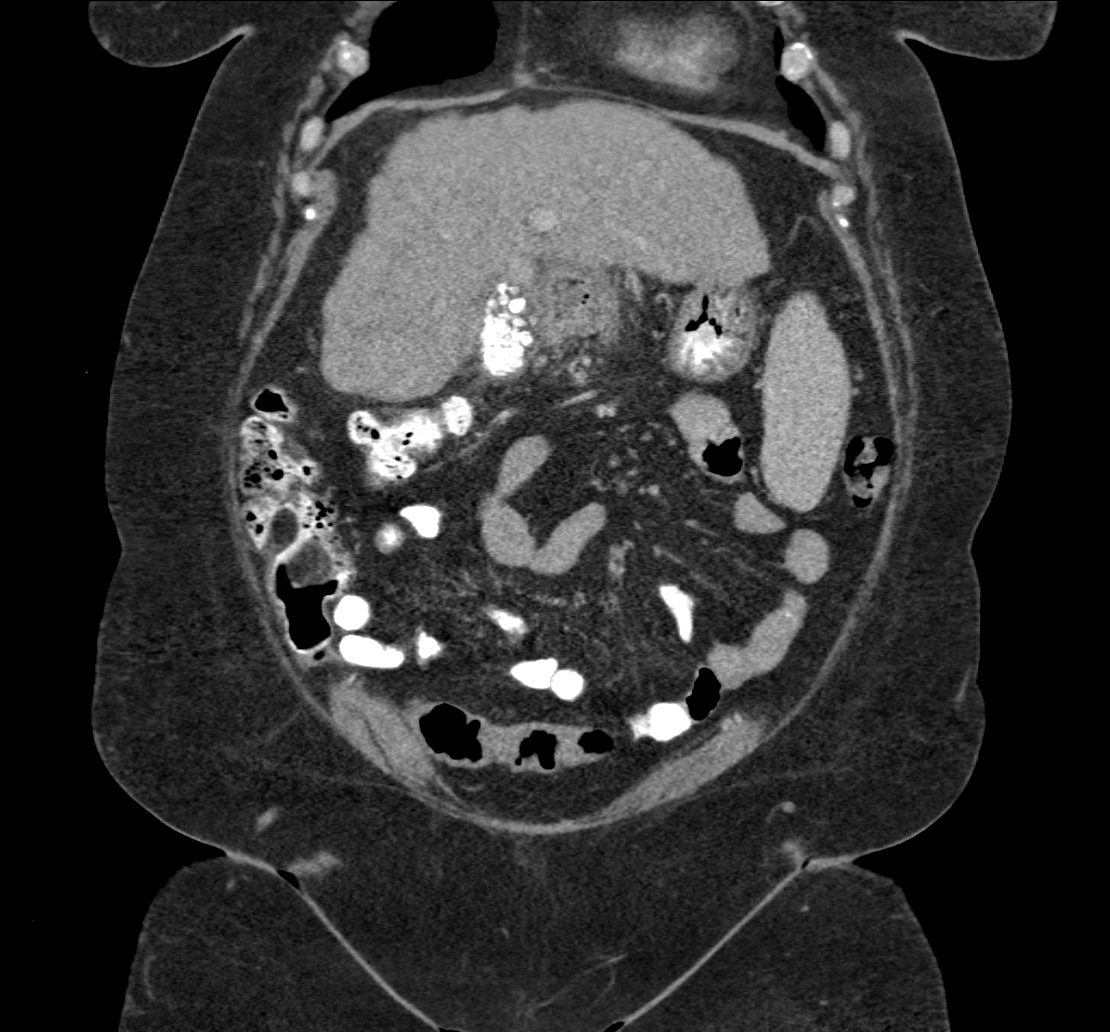
[im 65/146  soft-tissue]
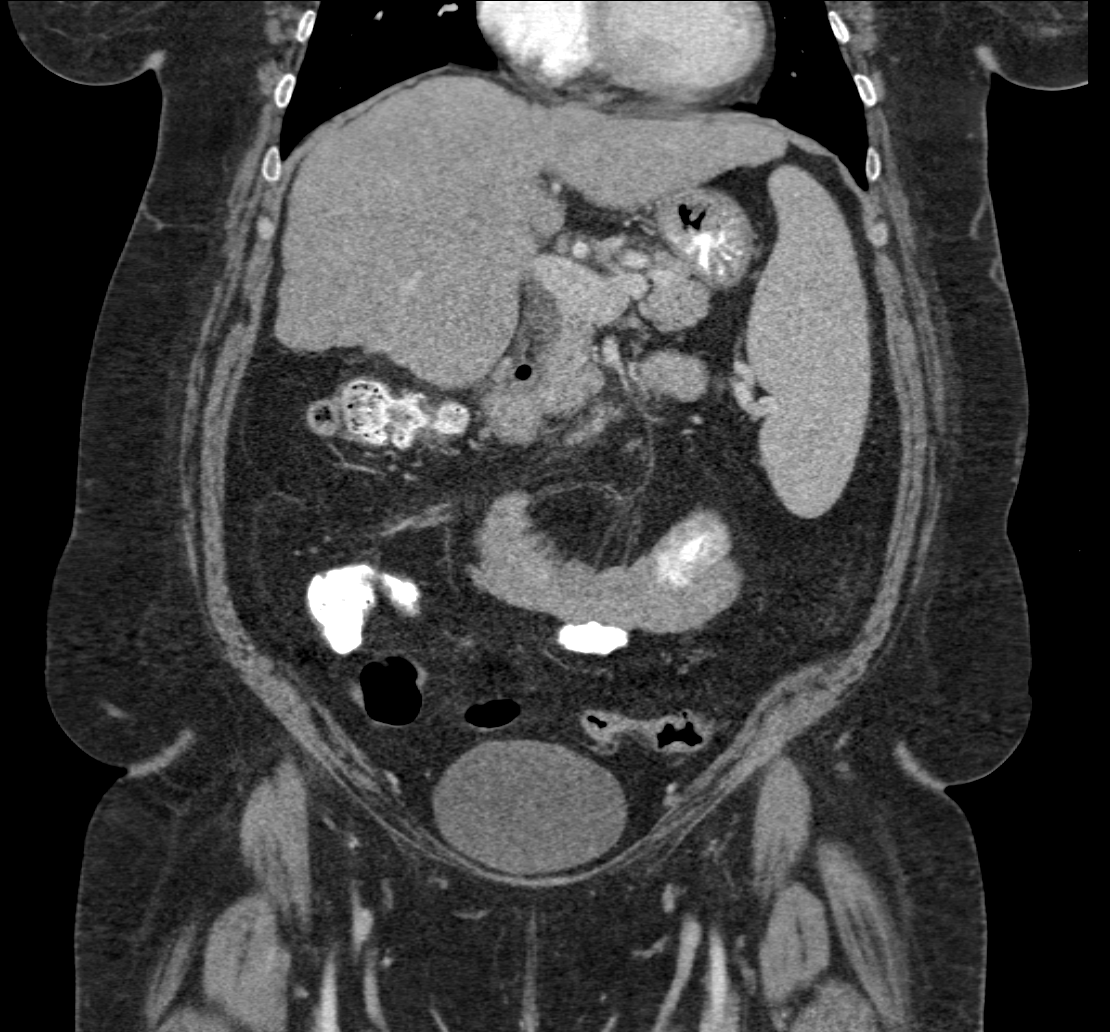
[im 81/146  soft-tissue]
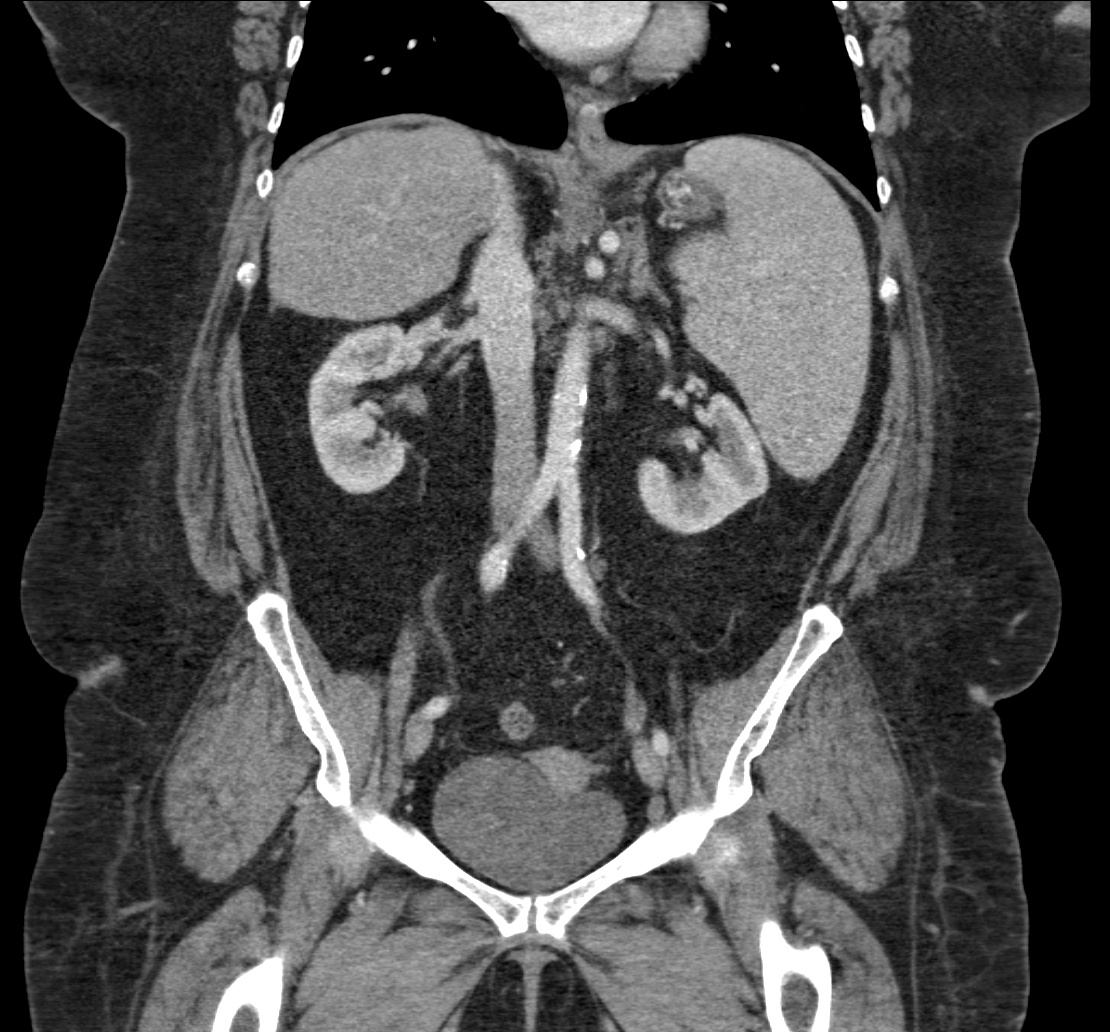

[9 of 46 positions shown; findings below may reference images not displayed]

FINDINGS: Lower chest:  Lung bases are clear.

Hepatobiliary: Cirrhotic configuration of the liver. No
suspicious/enhancing hepatic lesions.

Layering gallstones, without associated inflammatory changes. No
intrahepatic or extrahepatic ductal dilatation.

Pancreas: Within normal limits.

Spleen: Enlarged, measuring 15.4 cm in maximal craniocaudal
dimension.

Adrenals/Urinary Tract: Adrenal glands are unremarkable.

Kidneys are within normal limits.  No hydronephrosis.

Bladder is within normal limits.

Stomach/Bowel: Stomach is unremarkable.

No evidence of bowel obstruction.

Normal appendix.

Inflammatory changes along the second portion of the duodenum,
favored to reflect duodenitis, possibly with superimposed
inflammatory changes along a duodenum diverticulum or underlying
duodenal ulcer (series 301/ image 14). No free air.

Vascular/Lymphatic: Atherosclerotic calcifications of the abdominal
aorta and branch vessels.

Portal vein is patent.

No suspicious abdominopelvic lymphadenopathy.

Reproductive: Uterus is unremarkable.

Bilateral ovaries are within normal limits.

Other: No abdominopelvic ascites.

Moderate fat containing periumbilical hernia (series 21/image 36).

Musculoskeletal: Visualized osseous structures are within normal
limits.
IMPRESSION: Suspected duodenitis, as described above.  No free air.

No evidence of bowel obstruction.  Normal appendix.

Cholelithiasis, without associated inflammatory changes.

Cirrhosis.  Splenomegaly.  Portal vein is patent.

## 2021-11-26 ENCOUNTER — Inpatient Hospital Stay
Admit: 2021-11-26 | Discharge: 2021-11-27 | Disposition: A | Discharge status: Home / Self Care | Attending: Emergency Medicine

## 2021-11-26 ENCOUNTER — Emergency Department: Admit: 2021-11-26

## 2021-11-26 DIAGNOSIS — T148XXA Other injury of unspecified body region, initial encounter: Secondary | ICD-10-CM

## 2021-11-26 MED ORDER — NAPROXEN 500 MG TAB
500 mg | ORAL_TABLET | Freq: Two times a day (BID) | ORAL | 0 refills | Status: AC
Start: 2021-11-26 — End: 2021-12-06

## 2021-11-26 MED ORDER — HYDROXYZINE 50 MG TAB
50 mg | ORAL_TABLET | Freq: Four times a day (QID) | ORAL | 0 refills | Status: AC | PRN
Start: 2021-11-26 — End: 2021-12-06

## 2021-11-26 MED ORDER — BACITRACIN 500 UNIT/G OINTMENT
500 unit/gram | Freq: Three times a day (TID) | CUTANEOUS | 0 refills | Status: AC
Start: 2021-11-26 — End: 2021-12-06

## 2021-11-26 MED ORDER — KETOROLAC TROMETHAMINE 30 MG/ML INJECTION
30 mg/mL (1 mL) | INTRAMUSCULAR | Status: DC
Start: 2021-11-26 — End: 2021-11-26

## 2021-11-26 MED ORDER — LIDOCAINE 4 % TOPICAL PATCH (12 HOUR DURATION)
4 % | Freq: Once | CUTANEOUS | Status: DC
Start: 2021-11-26 — End: 2021-11-26

## 2021-11-26 MED ORDER — NAPROXEN 250 MG TAB
250 mg | Freq: Once | ORAL | Status: AC
Start: 2021-11-26 — End: 2021-11-26
  Administered 2021-11-27: via ORAL

## 2021-11-26 MED ORDER — BACITRACIN 500 UNIT/G TOPICAL PACKET
500 unit/gram | Freq: Once | CUTANEOUS | Status: AC
Start: 2021-11-26 — End: 2021-11-26
  Administered 2021-11-27: via TOPICAL

## 2021-11-26 MED ORDER — LIDOCAINE 5 % (700 MG/PATCH) ADHESIVE PATCH
5 % | CUTANEOUS | 0 refills | Status: AC
Start: 2021-11-26 — End: ?

## 2021-11-26 MED FILL — BACITRACIN 500 UNIT/G TOPICAL PACKET: 500 unit/gram | CUTANEOUS | Qty: 1

## 2021-11-26 MED FILL — NAPROXEN 250 MG TAB: 250 mg | ORAL | Qty: 2

## 2021-11-26 MED FILL — LIDOCAINE 4 % TOPICAL PATCH (12 HOUR DURATION): 4 % | CUTANEOUS | Qty: 2

## 2021-11-26 NOTE — ED Notes (Signed)
Pt arrives from home with cc of a fall 5 days ago. She is c/o right foot pain and shoulder pain. She is concerned about a bump that formed on the top of her foot.

## 2021-11-26 NOTE — ED Notes (Signed)
Buddy tape 4th and 5th toe right foot.     RN reviewed discharge instructions with the patient.  The patient verbalized understanding. PT left ED ambulatory with discharge papers I hand.

## 2021-11-26 NOTE — ED Provider Notes (Signed)
ED Provider Notes by Lynett FishFleming Hoyt, Hubbard RobinsonAmy M, NP at 11/26/21 1831                Author: Lynett FishFleming Hoyt, Hubbard RobinsonAmy M, NP  Service: Emergency Medicine  Author Type: Nurse Practitioner       Filed: 11/26/21 2155  Date of Service: 11/26/21 1831  Status: Attested           Editor: Lynett FishFleming Hoyt, Hubbard RobinsonAmy M, NP (Nurse Practitioner)  Cosigner: Noreene LarssonMacias, Matthew, MD at 11/28/21 [phone]          Attestation signed by Noreene LarssonMacias, Matthew, MD at 11/28/21 [phone]          I was personally available for consultation in the emergency department.  I have reviewed the chart and agree with the documentation recorded by the [facility], including  the assessment, treatment plan, and disposition.   Noreene LarssonMatthew Macias, MD                               61 year old female with past medical history of anxiety, depression, heart failure, liver failure, alcohol cirrhosis  of the liver, hypomagnesia, hypokalemia, anemia, pneumonia presents ambulatory to the ER for evaluation of right foot pain with a blister and right shoulder pain.  Patient states that she fell onto her right shoulder approximately 5 days ago, had what  appeared to be a blister on top of her right foot that looks different currently.  Patient denies fever, chills, denies new numbness or tingling, but states that she has a chronic history of decreased sensation to the tip of her toes on the right foot.   Patient denies hitting her head, loss of consciousness, no nausea, vomiting, abdominal pain, weakness.  Patient denies previous history of surgery to the right arm, denies any weakness or numbness or tingling, states that she has pain when she tries  to raise it out to the right side.  Patient is right-hand dominant.            Past Medical History:        Diagnosis  Date         ?  Anxiety       ?  Depression       ?  Gastrointestinal disorder       ?  Heart failure (HCC)       ?  Liver disease  2009          liver failure         ?  Pneumonia               Past Surgical History:         Procedure   Laterality  Date          ?  HX ADENOIDECTOMY    1972     ?  HX CATARACT REMOVAL         ?  HX CESAREAN SECTION    1986 and 1993     ?  HX DILATION AND CURETTAGE    1999     ?  HX HYSTERECTOMY    2008     ?  HX TONSILLECTOMY    1972     ?  PR EGD TRANSORAL BIOPSY SINGLE/MULTIPLE    05/17/2009                          Family History:  Problem  Relation  Age of Onset          ?  Cancer  Mother            ?  Heart Disease  Father               Social History          Socioeconomic History         ?  Marital status:  DIVORCED              Spouse name:  Not on file         ?  Number of children:  Not on file     ?  Years of education:  Not on file     ?  Highest education level:  Not on file       Occupational History        ?  Not on file       Tobacco Use         ?  Smoking status:  Former              Types:  Cigarettes         Quit date:  02/28/2008         Years since quitting:  13.7         ?  Smokeless tobacco:  Never       Substance and Sexual Activity         ?  Alcohol use:  No     ?  Drug use:  Not on file     ?  Sexual activity:  Not on file        Other Topics  Concern        ?  Not on file       Social History Narrative        ?  Not on file          Social Determinants of Health          Financial Resource Strain: Not on file     Food Insecurity: Not on file     Transportation Needs: Not on file     Physical Activity: Not on file     Stress: Not on file     Social Connections: Not on file     Intimate Partner Violence: Not on file       Housing Stability: Not on file              ALLERGIES: Latex, natural rubber      Review of Systems    Constitutional:  Negative for chills and fever.    Eyes:  Negative for visual disturbance.    Respiratory: Negative.      Cardiovascular: Negative.     Gastrointestinal:  Negative for abdominal pain, nausea and vomiting.    Genitourinary: Negative.     Musculoskeletal:  Positive for myalgias.         Pain to the right foot 3rd, 4th and 5th digit. Right shoulder pain.      Skin:  Positive for wound.         Broken blister on top of the foot, over the 3rd 4th and 5th digit,.     Neurological:  Positive for numbness. Negative for weakness.         Chronic numbness to the toes on the right foot.     Psychiatric/Behavioral: Negative.  Vitals:           11/26/21 1815  11/26/21 1924         BP:  (!) 164/104  (!) 158/81     Pulse:  85       Resp:  20       Temp:  98 F (36.7 C)           SpO2:  96%                  Physical Exam   Vitals and nursing note reviewed.    Constitutional:        General: She is not in acute distress.      Appearance: Normal appearance. She is obese. She is not ill-appearing.    HENT:       Head: Normocephalic.       Nose: Nose normal.    Cardiovascular:       Rate and Rhythm: Normal rate.    Pulmonary:       Effort: Pulmonary effort is normal. No respiratory distress.     Abdominal:       General: There is no distension.     Musculoskeletal:       Right shoulder: Tenderness and bony tenderness  present. No swelling, deformity or crepitus. Decreased range of motion. Normal strength. Normal pulse.       Left shoulder: Normal.       Cervical back: Normal range of motion.    Skin:      General: Skin is warm and dry.       Capillary Refill: Capillary refill takes less than 2 seconds.       Findings: Wound present.                  Comments: Broken blister on the top of the 3rd, 4th and 5th digit. Skin 90 % on, small open area at the bottom, capillary refill 3 sec, strong 2+ palpable 2+ DP.  Patient able to move toes without  difficulty, mild discomfort, able to discern when she was being touched and when she was not being touched.  Expressed chronic history of decreased sensation to the toes.  No abnormal erythema, appears to have purpleish appearance for chronic venous stasis,  no abnormal warmth and no swelling noted.    Neurological:       General: No focal deficit present.       Mental Status: She is alert and oriented to person, place, and time.     Psychiatric:          Mood and Affect: Mood normal.          Thought Content: Thought content normal.           Medical Decision Making   Differential DX: shoulder strain vs shoulder contusion vs right foot fracture vs 4th or 5th right digit fracture vs dislocation  vs cellutlitis      1. Previous notes (external to Emergency room) were reviewed: chart review      2.History was obtained by: patient      3. Chronic medical issues affecting this visit: alcohol cirrhosis of the liver, abnormal gait HX      4. I ordered the following tests, followed by review of final reads by lab and radiology: xray right shoulder, xray right foot      5. I considered ordering: cbc, cmp       6. Medical intervention: lidocaine patch, naprosyn  Surgical intervention: none indicated      7. Social determinants of health: none      8 Final diagnosis: Acute pain of right shoulder, anxiety and closed   Displaced fracture of proximal phalanx of lesser toe of the right foot for the fourth and fifth digit.. Medications prescribed: Naproxen as needed.      9. Disposition: Discharge to home and follow up with PCP, return to the emergency room with worsening symptoms. Patient in agreement with plan of care.  Patient is well-appearing, noted elevated blood pressure 164/104, patient denies vision change, feeling  dizzy or lightheaded, chest pain, states that she recently moved back to this area and currently does not have a primary care provider and is looking to establish care.  Patient states that she fell 5 days ago has been up and ambulating without the use  of DME, here to be seen for right shoulder discomfort when lays when raising out laterally to the side and a blister that popped on top of the right foot.   Patient reevaluated family present discussed imaging results for the X ray of the right shoulder showing no acute abnormality within the limits of the patient's increased body habitus and motion.  X-ray of the right foot showing  acute fourth and fifth  proximal phalanx fractures.  Discussed plan with patient to buddy tape the fourth and fifth digit, will provide shoe cast, discussed wound care for the blister above the third fourth and fifth digit to apply bacitracin up to 3 times daily keep wound clean  and dry.  Provided/discussed free/low-cost clinic sheet to establish care with PCP.  Patient upon discharge stating that she is out of her anxiety and depression medications, states that she has not had them for the last 2 months.  Patient specifically  denies any SI/HI, states that she feels slightly anxious, requesting prescription for hydroxyzine in order to get her through until she can obtain a primary care provider for further assistance.  Discussed mental health resources on the free/low-cost  clinic sheet to include crisis and additional mental health providers.  Patient verbalized understanding and agrees with plan.  Patient remains neurovascularly intact after splint placement to the right foot.      Problems Addressed:   Acute pain of right shoulder: acute illness or injury   Anxiety:  chronic illness or injury   Closed nondisplaced fracture of proximal phalanx of lesser toe of right foot, initial encounter: acute illness or injury   Closed nondisplaced fracture of proximal phalanx of lesser toe of right foot, initial encounter: acute illness or injury   Medication refill: acute illness or injury   Wound of skin: acute illness or injury      Amount and/or Complexity of Data Reviewed   Radiology: ordered.      Risk   OTC drugs.   Prescription drug management.                Procedures   VITAL SIGNS:   Patient Vitals for the past 4 hrs:            Temp  Pulse  Resp  BP  SpO2            11/26/21 1924  --  --  --  (!) 158/81  --            11/26/21 1815  98 F (36.7 C)  85  20  (!) 164/104  96 %  LABS:   The Following labs have been ordered while in the emergency department and I have independently evaluated them.        No results found for this or any previous visit (from the past 6 hour(s)).          IMAGING:   The Following imaging studies have been ordered while in the emergency department and I have reviewed them.         XR SHOULDER RT AP/LAT MIN 2 V       Final Result     No acute abnormality, within the limits of patient's increased body     habitus and motion.            XR FOOT RT MIN 3 V       Final Result     Acute fourth and fifth proximal phalanx fractures..                     Medications During Visit:   I ordered/approved the ordering of the following medicines for the patient while in the emergency department.        Medications       lidocaine 4 % patch 2 Patch (2 Patches TransDERmal Apply Patch 11/26/21 2004)     bacitracin 500 unit/gram packet 1 Packet (1 Packet Topical Given 11/26/21 2004)       naproxen (NAPROSYN) tablet 500 mg (500 mg Oral Given 11/26/21 2005)           IMPRESSION:      1.  Wound of skin      2.  Acute pain of right shoulder      3.  Closed nondisplaced fracture of proximal phalanx of lesser toe of right foot, initial encounter      4.  Closed nondisplaced fracture of proximal phalanx of lesser toe of right foot, initial encounter      5.  Anxiety         6.  Medication refill            DISPOSITION:   Discharged           Current Discharge Medication List                 START taking these medications          Details        bacitracin (BACITRACIN) 500 unit/gram oint  Apply  to affected area three (3) times daily for 10 days. Apply to affected area   Qty: 1 Each, Refills: 0   Start date: 11/26/2021, End date: 12/06/2021               naproxen (Naprosyn) 500 mg tablet  Take 1 Tablet by mouth two (2) times daily (with meals) for 10 days.   Qty: 20 Tablet, Refills: 0   Start date: 11/26/2021, End date: 12/06/2021               lidocaine (LIDODERM) 5 %  Apply patch to the affected area for 12 hours a day and remove for 12 hours a day.   Qty: 15 Each, Refills: 0   Start date: 11/26/2021                hydrOXYzine HCL (ATARAX) 50 mg tablet  Take 0.5 Tablets by mouth every six (6) hours as needed for Anxiety (You may take 25-50 mg for anxiety as needed.) for up to 10 days.   Qty: 20  Tablet, Refills: 0   Start date: 11/26/2021, End date: 12/06/2021                            Follow-up Information                  Follow up With  Specialties  Details  Why  Contact Info              [facility]    Call   Schedule an appt.  [address]   [address]   [place] [address]   [phone]              [facility] EMERGENCY DEP  Emergency Medicine    If symptoms worsen  [address]   [place] [address]   [phone]                        The patient is asked to follow-up with their primary care provider in the next several days.  They are to call tomorrow for an appointment.  The patient is asked to return promptly for any increased concerns or worsening of symptoms.  They can return  to this emergency department or any other emergency department.      Aiyannah Fayad Nobie Putnam, NP   8:19 PM

## 2021-12-07 ENCOUNTER — Inpatient Hospital Stay
Admit: 2021-12-07 | Discharge: 2021-12-08 | Disposition: A | Discharge status: Home / Self Care | Payer: MEDICARE | Attending: Emergency Medicine

## 2021-12-07 ENCOUNTER — Emergency Department: Admit: 2021-12-07 | Payer: MEDICARE

## 2021-12-07 DIAGNOSIS — R079 Chest pain, unspecified: Secondary | ICD-10-CM

## 2021-12-07 DIAGNOSIS — R0789 Other chest pain: Secondary | ICD-10-CM

## 2021-12-07 LAB — CBC WITH AUTOMATED DIFF
ABS. BASOPHILS: 0 10*3/uL (ref 0.0–0.1)
ABS. EOSINOPHILS: 0.1 10*3/uL (ref 0.0–0.4)
ABS. IMM. GRANS.: 0 10*3/uL (ref 0.00–0.04)
ABS. LYMPHOCYTES: 1.3 10*3/uL (ref 0.8–3.5)
ABS. MONOCYTES: 0.4 10*3/uL (ref 0.0–1.0)
ABS. NEUTROPHILS: 5 10*3/uL (ref 1.8–8.0)
ABSOLUTE NRBC: 0 10*3/uL (ref 0.00–0.01)
BASOPHILS: 1 % (ref 0–1)
EOSINOPHILS: 1 % (ref 0–7)
HCT: 44.1 % (ref 35.0–47.0)
HGB: 12.8 g/dL (ref 11.5–16.0)
IMMATURE GRANULOCYTES: 0 % (ref 0.0–0.5)
LYMPHOCYTES: 19 % (ref 12–49)
MCH: 23.5 pg — ABNORMAL LOW (ref 26.0–34.0)
MCHC: 29 g/dL — ABNORMAL LOW (ref 30.0–36.5)
MCV: 81.1 FL (ref 80.0–99.0)
MONOCYTES: 6 % (ref 5–13)
MPV: 11.2 FL (ref 8.9–12.9)
NEUTROPHILS: 73 % (ref 32–75)
NRBC: 0 /100{WBCs}
PLATELET: 116 10*3/uL — ABNORMAL LOW (ref 150–400)
RBC: 5.44 M/uL — ABNORMAL HIGH (ref 3.80–5.20)
RDW: 15.4 % — ABNORMAL HIGH (ref 11.5–14.5)
WBC: 6.8 10*3/uL (ref 3.6–11.0)

## 2021-12-07 LAB — METABOLIC PANEL, COMPREHENSIVE
A-G Ratio: 0.8 — ABNORMAL LOW (ref 1.1–2.2)
ALT (SGPT): 34 U/L (ref 12–78)
AST (SGOT): 44 U/L — ABNORMAL HIGH (ref 15–37)
Albumin: 3.2 g/dL — ABNORMAL LOW (ref 3.5–5.0)
Alk. phosphatase: 147 U/L — ABNORMAL HIGH (ref 45–117)
Anion gap: 1 mmol/L — ABNORMAL LOW (ref 5–15)
BUN/Creatinine ratio: 24 — ABNORMAL HIGH (ref 12–20)
BUN: 21 mg/dL — ABNORMAL HIGH (ref 6–20)
Bilirubin, total: 0.6 mg/dL (ref 0.2–1.0)
CO2: 30 mmol/L (ref 21–32)
Calcium: 9 mg/dL (ref 8.5–10.1)
Chloride: 105 mmol/L (ref 97–108)
Creatinine: 0.86 mg/dL (ref 0.55–1.02)
Globulin: 3.8 g/dL (ref 2.0–4.0)
Glucose: 128 mg/dL — ABNORMAL HIGH (ref 65–100)
Potassium: 3.8 mmol/L (ref 3.5–5.1)
Protein, total: 7 g/dL (ref 6.4–8.2)
Sodium: 136 mmol/L (ref 136–145)
eGFR: >60 mL/min/{1.73_m2} (ref >60)

## 2021-12-07 LAB — SAMPLES BEING HELD

## 2021-12-07 LAB — TROPONIN-HIGH SENSITIVITY: Troponin-High Sensitivity: 16 ng/L (ref 0–37)

## 2021-12-07 LAB — COMPREHENSIVE METABOLIC PANEL
ALT: 34 U/L (ref 12–78)
AST: 44 U/L — ABNORMAL HIGH (ref 15–37)
Albumin/Globulin Ratio: 0.8 — ABNORMAL LOW (ref 1.1–2.2)
Albumin: 3.2 g/dL — ABNORMAL LOW (ref 3.5–5.0)
Alkaline Phosphatase: 147 U/L — ABNORMAL HIGH (ref 45–117)
Anion Gap: 1 mmol/L — ABNORMAL LOW (ref 5–15)
BUN: 21 MG/DL — ABNORMAL HIGH (ref 6–20)
Bun/Cre Ratio: 24 — ABNORMAL HIGH (ref 12–20)
CO2: 30 mmol/L (ref 21–32)
Calcium: 9 MG/DL (ref 8.5–10.1)
Chloride: 105 mmol/L (ref 97–108)
Creatinine: 0.86 MG/DL (ref 0.55–1.02)
ESTIMATED GLOMERULAR FILTRATION RATE: >60 mL/min/{1.73_m2} (ref >60)
Globulin: 3.8 g/dL (ref 2.0–4.0)
Glucose: 128 mg/dL — ABNORMAL HIGH (ref 65–100)
Potassium: 3.8 mmol/L (ref 3.5–5.1)
Sodium: 136 mmol/L (ref 136–145)
Total Bilirubin: 0.6 MG/DL (ref 0.2–1.0)
Total Protein: 7 g/dL (ref 6.4–8.2)

## 2021-12-07 LAB — CBC WITH AUTO DIFFERENTIAL
Basophils %: 1 % (ref 0–1)
Basophils Absolute: 0 10*3/uL (ref 0.0–0.1)
Eosinophils %: 1 % (ref 0–7)
Eosinophils Absolute: 0.1 10*3/uL (ref 0.0–0.4)
Granulocyte Absolute Count: 0 10*3/uL (ref 0.00–0.04)
Hematocrit: 44.1 % (ref 35.0–47.0)
Hemoglobin: 12.8 g/dL (ref 11.5–16.0)
Immature Granulocytes: 0 % (ref 0.0–0.5)
Lymphocytes %: 19 % (ref 12–49)
Lymphocytes Absolute: 1.3 10*3/uL (ref 0.8–3.5)
MCH: 23.5 PG — ABNORMAL LOW (ref 26.0–34.0)
MCHC: 29 g/dL — ABNORMAL LOW (ref 30.0–36.5)
MCV: 81.1 FL (ref 80.0–99.0)
MPV: 11.2 FL (ref 8.9–12.9)
Monocytes %: 6 % (ref 5–13)
Monocytes Absolute: 0.4 10*3/uL (ref 0.0–1.0)
NRBC Absolute: 0 10*3/uL (ref 0.00–0.01)
Neutrophils %: 73 % (ref 32–75)
Neutrophils Absolute: 5 10*3/uL (ref 1.8–8.0)
Nucleated RBCs: 0 PER 100 WBC
Platelets: 116 10*3/uL — ABNORMAL LOW (ref 150–400)
RBC: 5.44 M/uL — ABNORMAL HIGH (ref 3.80–5.20)
RDW: 15.4 % — ABNORMAL HIGH (ref 11.5–14.5)
WBC: 6.8 10*3/uL (ref 3.6–11.0)

## 2021-12-07 LAB — TROPONIN, HIGH SENSITIVITY: Troponin, High Sensitivity: 16 ng/L (ref 0–37)

## 2021-12-07 NOTE — ED Notes (Signed)
Pt arrives via [facility] from extended stay hotel with complaint of chest pain. Pt reports chest pain starting around 2pm today above the breasts & radiating to the right side of the neck. Pt has a past hx of MI & gastric ulcers. Per EMS VS stable, SBP in 180's. Pt took a nitro prior to EMS arrival & EMS gave pt 324 mg aspirin.

## 2021-12-07 NOTE — ED Provider Notes (Signed)
ED Provider Notes by Miquel DunnAtri, Maizee Reinhold S, MD at 12/07/21 1821                Author: Miquel DunnAtri, Manju Kulkarni S, MD  Service: --  Author Type: Physician       Filed: 12/10/21 0109  Date of Service: 12/07/21 1821  Status: Signed          Editor: Miquel DunnAtri, Yannis Gumbs S, MD (Physician)               61 year old female with history of coronary artery disease, depression presents with complaints of chest pain starting  this afternoon, right-sided radiating to her neck.  Patient reports pain is much improved now after aspirin and 1 nitroglycerin taken prior to arrival.  Patient reports she has had chest pain like this in the past and is typically relieved by nitroglycerin.   Currently does not have a cardiologist as she just moved to the area.  Denies any recent illness, fever, chills, nausea, vomiting, diarrhea, constipation.  Denies history of DVT/PE.  No other complaints.      Former smoker, denies alcohol and drug use            Past Medical History:        Diagnosis  Date         ?  Anxiety       ?  Depression       ?  Gastrointestinal disorder       ?  Heart failure (HCC)       ?  Liver disease  2009          liver failure         ?  Pneumonia               Past Surgical History:         Procedure  Laterality  Date          ?  HX ADENOIDECTOMY    1972     ?  HX CATARACT REMOVAL         ?  HX CESAREAN SECTION    1986 and 1993     ?  HX DILATION AND CURETTAGE    1999     ?  HX HYSTERECTOMY    2008     ?  HX TONSILLECTOMY    1972     ?  PR EGD TRANSORAL BIOPSY SINGLE/MULTIPLE    05/17/2009                          Family History:         Problem  Relation  Age of Onset          ?  Cancer  Mother            ?  Heart Disease  Father               Social History          Socioeconomic History         ?  Marital status:  DIVORCED              Spouse name:  Not on file         ?  Number of children:  Not on file     ?  Years of education:  Not on file     ?  Highest education level:  Not on file  Occupational History        ?  Not on  file       Tobacco Use         ?  Smoking status:  Former              Types:  Cigarettes         Quit date:  02/28/2008         Years since quitting:  13.7         ?  Smokeless tobacco:  Never       Substance and Sexual Activity         ?  Alcohol use:  No     ?  Drug use:  Not on file     ?  Sexual activity:  Not on file        Other Topics  Concern        ?  Not on file       Social History Narrative        ?  Not on file          Social Determinants of Health          Financial Resource Strain: Not on file     Food Insecurity: Not on file     Transportation Needs: Not on file     Physical Activity: Not on file     Stress: Not on file     Social Connections: Not on file     Intimate Partner Violence: Not on file       Housing Stability: Not on file              ALLERGIES: Latex, natural rubber      Review of Systems    Constitutional:  Negative for chills and fever.    HENT:  Negative for congestion, nosebleeds and rhinorrhea.     Eyes:  Negative for pain and redness.    Respiratory:  Negative for cough and shortness of breath.     Cardiovascular:  Positive for chest pain. Negative for palpitations.    Gastrointestinal:  Negative for abdominal distention, abdominal pain, nausea and vomiting.    Genitourinary:  Negative for dysuria, frequency, vaginal bleeding and vaginal pain.    Musculoskeletal:  Negative for myalgias.    Skin:  Negative for rash and wound.    Neurological:  Negative for seizures, syncope and weakness.    Hematological:  Does not bruise/bleed easily.    Psychiatric/Behavioral:  Negative for agitation, confusion, dysphoric mood and suicidal ideas. The patient is not nervous/anxious.          Vitals:          12/07/21 1720        BP:  (!) 173/65     Pulse:  82     Resp:  16     Temp:  97.9 F (36.6 C)        SpO2:  100%                Physical Exam   Vitals and nursing note reviewed.    Constitutional:        Appearance: She is well-developed.    HENT:       Head: Normocephalic and atraumatic.     Eyes:       Pupils: Pupils are equal, round, and reactive to light.    Neck:       Trachea: No tracheal deviation.  Cardiovascular:       Rate and Rhythm: Normal rate and regular rhythm.       Heart sounds: Normal heart sounds.    Pulmonary:       Effort: Pulmonary effort is normal. No respiratory distress.       Breath sounds: Normal breath sounds. No stridor. No wheezing or rales.    Chest:       Chest wall: No tenderness.    Abdominal:       General: Bowel sounds are normal. There is no distension.       Palpations: Abdomen is soft.       Tenderness: There is no abdominal tenderness. There is no rebound.     Musculoskeletal:          General: No tenderness. Normal range of motion.       Cervical back: Normal range of motion and neck supple.    Skin:      General: Skin is warm and dry.       Coloration: Skin is not pale.       Findings: No rash.    Neurological:       Mental Status: She is alert and oriented to person, place, and time.       Cranial Nerves: No cranial nerve deficit.           Medical Decision Making   61 year old female with history of coronary artery disease, depression presents with complaints of atypical chest pain relieved  by nitroglycerin and aspirin prior to arrival.  Patient is well-appearing, no acute distress, hemodynamically stable, afebrile, nontoxic.  No respiratory distress, clear to auscultation bilaterally, normal oxygen saturation.  Plan-EKG, chest x-ray, CBC/CMP/cardiac  enzymes.      EKG, chest x-ray and labs with troponin x2 unremarkable   Xray mild cardiomegaly, No acute abnl as independently interpreted by me. Berneta Levins, MD         Problems Addressed:   Chest pain, unspecified type: acute illness or injury      Amount and/or Complexity of Data Reviewed   Labs: ordered.   Radiology: ordered.   ECG/medicine tests: ordered.           ED Course as of 12/07/21 1821       Wed Dec 07, 2021        1626  EKG interpretation:    Rhythm: sinus bradycardia; and regular . Rate  (approx.): 59; Axis: normal; Intervals: normal ; ST/T wave: T wave inverted; EKG documented and interpreted by Serita Sheller. Michel Harrow, MD, Emergency Medicine.    [AL]              ED Course User Index   [AL] Bertha Stakes., MD           Procedures         Patient's results have been reviewed with them.  Patient and/or family have verbally conveyed their understanding and agreement of the patient's signs, symptoms, diagnosis, treatment and prognosis and additionally agree to follow up as recommended or  return to the Emergency Room should their condition change prior to follow-up.  Discharge instructions have also been provided to the patient with some educational information regarding their diagnosis as well a list of reasons why they would want to  return to the ER prior to their follow-up appointment should their condition change.

## 2021-12-08 LAB — TROPONIN-HIGH SENSITIVITY: Troponin-High Sensitivity: 16 ng/L (ref 0–37)

## 2021-12-08 LAB — TROPONIN, HIGH SENSITIVITY: Troponin, High Sensitivity: 16 ng/L (ref 0–37)

## 2021-12-10 LAB — EKG, 12 LEAD, INITIAL
Atrial Rate: 59 {beats}/min
Calculated P Axis: 44 degrees
Calculated R Axis: 79 degrees
Calculated T Axis: 91 degrees
P-R Interval: 154 ms
Q-T Interval: 448 ms
QRS Duration: 86 ms
QTC Calculation (Bezet): 443 ms
Ventricular Rate: 59 {beats}/min

## 2021-12-10 LAB — EKG 12-LEAD
Atrial Rate: 59 {beats}/min
P Axis: 44 degrees
P-R Interval: 154 ms
Q-T Interval: 448 ms
QRS Duration: 86 ms
QTc Calculation (Bazett): 443 ms
R Axis: 79 degrees
T Axis: 91 degrees
Ventricular Rate: 59 {beats}/min

## 2022-03-19 ENCOUNTER — Inpatient Hospital Stay
Admit: 2022-03-19 | Discharge: 2022-03-19 | Disposition: A | Discharge status: Home / Self Care | Payer: MEDICARE | Attending: Student in an Organized Health Care Education/Training Program

## 2022-03-19 DIAGNOSIS — I4891 Unspecified atrial fibrillation: Secondary | ICD-10-CM

## 2022-03-19 LAB — CBC WITH AUTO DIFFERENTIAL
Absolute Immature Granulocyte: 0 10*3/uL (ref 0.00–0.04)
Basophils %: 1 % (ref 0–1)
Basophils Absolute: 0 10*3/uL (ref 0.0–0.1)
Eosinophils %: 2 % (ref 0–7)
Eosinophils Absolute: 0.1 10*3/uL (ref 0.0–0.4)
Hematocrit: 46.5 % (ref 35.0–47.0)
Hemoglobin: 14.1 g/dL (ref 11.5–16.0)
Immature Granulocytes: 0 % (ref 0.0–0.5)
Lymphocytes %: 29 % (ref 12–49)
Lymphocytes Absolute: 1.6 10*3/uL (ref 0.8–3.5)
MCH: 25.4 PG — ABNORMAL LOW (ref 26.0–34.0)
MCHC: 30.3 g/dL (ref 30.0–36.5)
MCV: 83.8 FL (ref 80.0–99.0)
MPV: 11.2 FL (ref 8.9–12.9)
Monocytes %: 7 % (ref 5–13)
Monocytes Absolute: 0.4 10*3/uL (ref 0.0–1.0)
Neutrophils %: 61 % (ref 32–75)
Neutrophils Absolute: 3.5 10*3/uL (ref 1.8–8.0)
Nucleated RBCs: 0 PER 100 WBC
Platelets: 113 10*3/uL — ABNORMAL LOW (ref 150–400)
RBC: 5.55 M/uL — ABNORMAL HIGH (ref 3.80–5.20)
RDW: 14.8 % — ABNORMAL HIGH (ref 11.5–14.5)
WBC: 5.7 10*3/uL (ref 3.6–11.0)
nRBC: 0 10*3/uL (ref 0.00–0.01)

## 2022-03-19 LAB — COMPREHENSIVE METABOLIC PANEL
ALT: 25 U/L (ref 12–78)
AST: 32 U/L (ref 15–37)
Albumin/Globulin Ratio: 0.8 — ABNORMAL LOW (ref 1.1–2.2)
Albumin: 3.2 g/dL — ABNORMAL LOW (ref 3.5–5.0)
Alk Phosphatase: 138 U/L — ABNORMAL HIGH (ref 45–117)
Anion Gap: 2 mmol/L — ABNORMAL LOW (ref 5–15)
BUN: 18 MG/DL (ref 6–20)
Bun/Cre Ratio: 21 — ABNORMAL HIGH (ref 12–20)
CO2: 29 mmol/L (ref 21–32)
Calcium: 9.2 MG/DL (ref 8.5–10.1)
Chloride: 109 mmol/L — ABNORMAL HIGH (ref 97–108)
Creatinine: 0.85 MG/DL (ref 0.55–1.02)
Est, Glom Filt Rate: >60 mL/min/{1.73_m2} (ref >60)
Globulin: 3.8 g/dL (ref 2.0–4.0)
Glucose: 125 mg/dL — ABNORMAL HIGH (ref 65–100)
Potassium: 3.6 mmol/L (ref 3.5–5.1)
Sodium: 140 mmol/L (ref 136–145)
Total Bilirubin: 0.5 MG/DL (ref 0.2–1.0)
Total Protein: 7 g/dL (ref 6.4–8.2)

## 2022-03-19 LAB — TROPONIN
Troponin, High Sensitivity: 44 ng/L (ref 0–51)
Troponin, High Sensitivity: 42 ng/L (ref 0–51)

## 2022-03-19 LAB — EXTRA TUBES HOLD

## 2022-03-19 LAB — BRAIN NATRIURETIC PEPTIDE: NT Pro-BNP: 217 PG/ML — ABNORMAL HIGH (ref <125)

## 2022-03-19 LAB — TSH: TSH, 3RD GENERATION: 3.57 u[IU]/mL (ref 0.36–3.74)

## 2022-03-19 MED ORDER — SODIUM CHLORIDE 0.9 % IV BOLUS
0.9 % | Freq: Once | INTRAVENOUS | Status: AC
Start: 2022-03-19 — End: 2022-03-19
  Administered 2022-03-19: 17:00:00 1000 mL via INTRAVENOUS

## 2022-03-19 MED FILL — SODIUM CHLORIDE 0.9 % IV SOLN: 0.9 % | INTRAVENOUS | Qty: 1000

## 2022-03-19 NOTE — ED Provider Notes (Signed)
[facility] EMERGENCY DEP  EMERGENCY DEPARTMENT ENCOUNTER      Pt Name: Breanna Lester  MRN: [id number]  Breanna Lester June 24, 1961  Date of evaluation: 03/19/2022  Provider: Andrey Farmer, MD    CHIEF COMPLAINT       Chief Complaint   Patient presents with    Chest Pain     Pt arrives via EMS for chest pain that started this AM with SOB. Denies fevers, nvd, belly pain. Per EMS pt Afib with RVR rate of 160-180 no known history. PMH-HTN. Given Nitro in route.          HISTORY OF PRESENT ILLNESS   (Location/Symptom, Timing/Onset, Context/Setting, Quality, Duration, Modifying Factors, Severity)  Note limiting factors.   Patient is a 61 year old female present emergency department for palpitations, elevated heart rate shortness of breath and chest pain.  Patient states that she woke up this morning was having chest discomfort felt like her heart was racing.  Patient states that she has had palpitations in the past recently moved from [place] and her PCP was working her up for cardiac causes of her palpitations she has never been diagnosed with A-fib.  Patient states that in the past due to her palpitations it was thought it was due to patient taking Benadryl for allergies.  On arrival patient was in A-fib with RVR with a rate of 140 upon entering the room patient had converted to a normal sinus rhythm with a rate of 73.  Patient states that her symptoms have resolved.  Patient denies using any stimulants the only caffeine that she drinks is Pepsi daily denies any coffee tobacco.  Patient also denies any recent illnesses takes no medications on a daily basis.          Review of External Medical Records:     Nursing Notes were reviewed.    REVIEW OF SYSTEMS    (2-9 systems for level 4, 10 or more for level 5)     Review of Systems    Except as noted above the remainder of the review of systems was reviewed and negative.       PAST MEDICAL HISTORY     Past Medical History:   Diagnosis Date    Anxiety     Depression      Gastrointestinal disorder     Heart failure ([place])     Liver disease 2009    liver failure    Pneumonia          SURGICAL HISTORY       Past Surgical History:   Procedure Laterality Date    ADENOIDECTOMY  1972    CATARACT REMOVAL      CESAREAN SECTION  1986 and 1993    DILATION AND CURETTAGE OF UTERUS  1999    EGD TRANSORAL BIOPSY SINGLE/MULTIPLE  05/17/2009         HYSTERECTOMY (CERVIX STATUS UNKNOWN)  2008    TONSILLECTOMY  1972         CURRENT MEDICATIONS       Previous Medications    No medications on file       ALLERGIES     Patient has no known allergies.    FAMILY HISTORY       Family History   Problem Relation Age of Onset    Cancer Mother     Heart Disease Father           SOCIAL HISTORY       Social History  Socioeconomic History    Marital status: Divorced   Tobacco Use    Smoking status: Former     Types: Cigarettes     Quit date: 02/28/2008     Years since quitting: 14.0    Smokeless tobacco: Never   Substance and Sexual Activity    Alcohol use: No           PHYSICAL EXAM    (up to 7 for level 4, 8 or more for level 5)     ED Triage Vitals [03/19/22 1211]   BP Temp Temp Source Pulse Respirations SpO2 Height Weight - Scale   (!) 127/98 97.9 F (36.6 C) Oral (!) 140 20 91 % 5' 2" (1.575 m) 250 lb (113.4 kg)       Body mass index is 45.73 kg/m.    Physical Exam  Vitals and nursing note reviewed.   Constitutional:       Appearance: Normal appearance. She is morbidly obese.   HENT:      Head: Normocephalic and atraumatic.      Nose: Nose normal.   Eyes:      Extraocular Movements: Extraocular movements intact.      Pupils: Pupils are equal, round, and reactive to light.   Cardiovascular:      Rate and Rhythm: Normal rate and regular rhythm.      Pulses: Normal pulses.      Heart sounds: Normal heart sounds.   Pulmonary:      Effort: Pulmonary effort is normal.      Breath sounds: Normal breath sounds.   Abdominal:      General: Bowel sounds are normal.      Palpations: Abdomen is soft.   Musculoskeletal:          General: Normal range of motion.      Cervical back: Normal range of motion and neck supple.   Skin:     General: Skin is warm and dry.   Neurological:      General: No focal deficit present.      Mental Status: She is alert and oriented to person, place, and time. Mental status is at baseline.   Psychiatric:         Mood and Affect: Mood normal.         Behavior: Behavior normal.       DIAGNOSTIC RESULTS     EKG: All EKG's are interpreted by the Emergency Department Physician who either signs or Co-signs this chart in the absence of a cardiologist.        RADIOLOGY:   Non-plain film images such as CT, Ultrasound and MRI are read by the radiologist. Plain radiographic images are visualized and preliminarily interpreted by the emergency physician with the below findings:        Interpretation per the Radiologist below, if available at the time of this note:    No orders to display        LABS:  Labs Reviewed   CBC WITH AUTO DIFFERENTIAL - Abnormal; Notable for the following components:       Result Value    RBC 5.55 (*)     MCH 25.4 (*)     RDW 14.8 (*)     Platelets 113 (*)     All other components within normal limits   COMPREHENSIVE METABOLIC PANEL - Abnormal; Notable for the following components:    Chloride 109 (*)     Anion Gap 2 (*)  Glucose 125 (*)     Bun/Cre Ratio 21 (*)     Alk Phosphatase 138 (*)     Albumin 3.2 (*)     Albumin/Globulin Ratio 0.8 (*)     All other components within normal limits   BRAIN NATRIURETIC PEPTIDE - Abnormal; Notable for the following components:    NT Pro-BNP 217 (*)     All other components within normal limits   EXTRA TUBES HOLD   TROPONIN   TSH   TROPONIN       All other labs were within normal range or not returned as of this dictation.    EMERGENCY DEPARTMENT COURSE and DIFFERENTIAL DIAGNOSIS/MDM:   Vitals:    Vitals:    03/19/22 1415 03/19/22 1430 03/19/22 1445 03/19/22 1545   BP: (!) 167/89 (!) 145/82 (!) 156/85 135/88   Pulse: 64 68 63 60   Resp: _0 Temp:       TempSrc:       SpO2: 98% 97% 93% 96%   Weight:       Height:               Medical Decision Making  Proximal A-fib, A-fib with RVR.  61 year old female present emergency department found to be in A-fib with RVR since this morning that spontaneously converted without medications.  We will continue work-up with labs including cardiac markers, TSH patient will stay on cardiac monitor.    Amount and/or Complexity of Data Reviewed  Labs: ordered.  ECG/medicine tests: ordered.    Risk  Prescription drug management.            REASSESSMENT     ED Course as of 03/19/22 1557   Sun Mar 19, 2022   1250 EKG shows A-fib with RVR rate of 140 no ST or T wave abnormalities to suggest ischemia or infarct. [DA]   1250 Repeat EKG shows a normal sinus rhythm with a rate of 73 no ST or T wave abnormalities to suggest ischemia or infarct.  Prolonged QT with QT/QTc 444/489 MS.  Normal intervals, normal axis. [DA]      ED Course User Index  [DA] Andrey Farmer, MD     3:57 PM    Delta troponin unchanged patient remains in a normal sinus rhythm asymptomatic.  Patient will be discharged with cardiology/EP follow-up    CONSULTS:  None    PROCEDURES:  Unless otherwise noted below, none     Procedures      FINAL IMPRESSION      1. Atrial fibrillation with rapid ventricular response [facility])          DISPOSITION/PLAN   DISPOSITION Decision To Discharge 03/19/2022 03:49:06 PM      PATIENT REFERRED TO:  Sedonia Small, DDS  [place] [address]  [phone]    Schedule an appointment as soon as possible for a visit   If symptoms worsen    An Milinda Hirschfeld, MD  [address]  [place] [address]  [phone]    Schedule an appointment as soon as possible for a visit         DISCHARGE MEDICATIONS:  New Prescriptions    No medications on file         (Please note that portions of this note were completed with a voice recognition program.  Efforts were made to edit the dictations  but occasionally words are mis-transcribed.)    Andrey Farmer, MD (electronically signed)  Emergency Attending Physician / Physician Assistant / Nurse Practitioner             Andrey Farmer, MD  03/19/22 [phone]

## 2022-03-19 NOTE — ED Notes (Signed)
Pt placed on 2L for 91% O2 sats. Pt improved to 95%     [employer], RN  03/19/22 1217

## 2022-03-20 ENCOUNTER — Encounter

## 2022-03-20 LAB — EKG 12-LEAD
Q-T Interval: 298 ms
QRS Duration: 82 ms
QTc Calculation (Bazett): 454 ms
R Axis: 122 degrees
T Axis: 23 degrees
Ventricular Rate: 140 {beats}/min

## 2022-03-20 NOTE — Telephone Encounter (Signed)
New pt want to schedule app with Dr.Bui,please advise    [phone]

## 2022-03-20 NOTE — Progress Notes (Signed)
Referral from ED to Dr. Daivd Council. Echo order placed.

## 2022-03-20 NOTE — Telephone Encounter (Signed)
LM for pt to call me

## 2022-03-21 LAB — EKG 12-LEAD
Atrial Rate: 73 {beats}/min
Diagnosis: NORMAL
P Axis: 75 degrees
P-R Interval: 156 ms
Q-T Interval: 444 ms
QRS Duration: 84 ms
QTc Calculation (Bazett): 489 ms
R Axis: 83 degrees
T Axis: 75 degrees
Ventricular Rate: 73 {beats}/min

## 2022-03-23 NOTE — Telephone Encounter (Signed)
R/T Pt Call LM for pt to call me sll

## 2022-03-24 NOTE — Telephone Encounter (Signed)
Called Pt to get her Scheduled for a Echo at the [facility] office first week in Oct for Dr. Daivd Council anyone can schedule if you calls   Dx is AF

## 2022-03-28 NOTE — Telephone Encounter (Signed)
Pt c/b and scheduled Echo but the she was asking questions about Medications Please call her at [phone]

## 2022-05-23 ENCOUNTER — Ambulatory Visit: Payer: MEDICARE | Primary: Pediatric Dentistry

## 2022-05-31 DIAGNOSIS — I480 Paroxysmal atrial fibrillation: Secondary | ICD-10-CM

## 2022-06-01 ENCOUNTER — Ambulatory Visit: Payer: MEDICARE | Attending: Internal Medicine | Primary: Pediatric Dentistry

## 2022-06-01 DIAGNOSIS — I480 Paroxysmal atrial fibrillation: Secondary | ICD-10-CM

## 2022-06-01 NOTE — Progress Notes (Deleted)
Cardiac Electrophysiology OFFICE Consultation Note       Assessment/Plan:   1. PAF (paroxysmal atrial fibrillation) (HCC)             ***    Subjective:       Breanna Lester is a 61 y.o. patient who is seen for evaluation of  Afib.     Presented to ER with palpitations found to be in AF with RVR at 140 bpm on 03/19/22.      I independently review internal and external records of most recent labs, EKGs, event monitors or Holters, and imaging including most recent echocardiograms and stress test.  Ordered follow up testing as indicated in orders, patient instructions. Previous notes from consultants and PCP are reviewed as well as pertinent prior admission documents.      Patient Active Problem List   Diagnosis    Gait abnormality    Alcoholic cirrhosis of liver (HCC)    Thrombocytopenia, unspecified (HCC)    Hypokalemia    Hypomagnesemia    Cholelithiasis    Nausea with vomiting    Hepatic encephalopathy (HCC)    PAF (paroxysmal atrial fibrillation) ([place])     No current outpatient medications on file.     No current facility-administered medications for this visit.     No Known Allergies  Past Medical History:   Diagnosis Date    Anxiety     Depression     Gastrointestinal disorder     Heart failure ([place])     Liver disease 2009    liver failure    Pneumonia      Past Surgical History:   Procedure Laterality Date    ADENOIDECTOMY  1972    CATARACT REMOVAL      CESAREAN SECTION  1986 and 1993    DILATION AND CURETTAGE OF UTERUS  1999    EGD TRANSORAL BIOPSY SINGLE/MULTIPLE  05/17/2009         HYSTERECTOMY (CERVIX STATUS UNKNOWN)  2008    TONSILLECTOMY  1972     Family History   Problem Relation Age of Onset    Cancer Mother     Heart Disease Father      Social History     Tobacco Use    Smoking status: Former     Types: Cigarettes     Quit date: 02/28/2008     Years since quitting: 14.2    Smokeless tobacco: Never   Substance Use Topics    Alcohol use: No        Review of Systems:   12 point review of systems was  performed. All negative except for HPI     Objective:   There were no vitals taken for this visit.      Physical Exam:   General:  Alert and oriented, in no acute distress  Head:  Atraumatic, normocephalic  Eyes:  extraocular muscles intact  Neck:  Supple, normal range of motion  Lungs:  Clear to auscultation bilaterally, no wheezes/rales/rhonchi   Cardiovascular:  Regular rate and rhythm, normal S1-S2, no murmurs/rubs/gallops  Abdomen:  Soft, nontender, nondistended, normoactive bowel sounds  Skin:  Intact, no rash  Extremities:, no clubbing, cyanosis, or edema  Musculoskeletal: normal range of motion  Neurological:  Alert and oriented, no focal neurologic deficits  Psychiatric:  Normal mood and affect    No results found for: "HBA1C", "HBA1CPOC"  EKG: ***    No results found for this or any previous visit.    No  results found for this or any previous visit.    No results found for this or any previous visit.      No valid procedures specified.  No valid procedures specified.  No valid procedures specified.  @[email or web address] @          Thank you for involving me in this patient's care and please call with further concerns or questions.      ________________________________________  Navjot Loera , MD, [facility], [facility]  Cardiac Electrophysiology  [facility]  [address] [place], [place]                             [phone]     [phone]  [place], [place]  [phone]

## 2022-08-28 NOTE — Telephone Encounter (Signed)
Formatting of this note might be different from the original.  lvm  Electronically signed by Lilli Few at 08/28/2022  2:49 PM EST

## 2022-08-28 NOTE — Telephone Encounter (Signed)
Formatting of this note might be different from the original.  lvm  Electronically signed by Lilli Few at 08/28/2022  8:52 AM EST

## 2022-09-01 NOTE — Progress Notes (Signed)
Formatting of this note is different from the original.      Spoke with patient at bedside in room, G9.    Physical Activity: The pt was not interested in getting resources in this area.    Financial Resource Strain: The pt was not interested in getting resources in this area.    Housing need: The pt was interested in getting resources in this area.    Transportation need: The pt was not interested in getting resources in this area.    Food need: The pt was interested in getting resources in this area.    Utility need: The pt was not interested in getting resources in this area.    Additional Notes:    Action Plan: The pt was interested in getting and was given resources in the areas of [employer], [employer], [place] and food pantries. The pt was interested in getting a PCP and PCP appointment, which was completed, in another encounter.    Electronically signed by [name] at 09/01/2022  3:08 PM EST

## 2023-02-27 ENCOUNTER — Encounter

## 2024-02-18 ENCOUNTER — Inpatient Hospital Stay
Admit: 2024-02-18 | Discharge: 2024-02-18 | Disposition: A | Discharge status: Home / Self Care | Payer: Medicare (Managed Care) | Arrived: AM | Attending: Emergency Medicine

## 2024-02-18 ENCOUNTER — Emergency Department: Admit: 2024-02-18 | Payer: Medicare (Managed Care) | Primary: Diagnostic Radiology

## 2024-02-18 DIAGNOSIS — M25562 Pain in left knee: Principal | ICD-10-CM

## 2024-02-18 DIAGNOSIS — S8002XA Contusion of left knee, initial encounter: Principal | ICD-10-CM

## 2024-02-18 MED ORDER — KETOROLAC TROMETHAMINE 30 MG/ML IJ SOLN
30 | INTRAMUSCULAR | Status: AC
Start: 2024-02-18 — End: 2024-02-18
  Administered 2024-02-18: 23:00:00 30 mg via INTRAMUSCULAR

## 2024-02-18 MED ORDER — NAPROXEN 500 MG PO TABS
500 | ORAL_TABLET | Freq: Two times a day (BID) | ORAL | 0 refills | 30.00000 days | Status: AC
Start: 2024-02-18 — End: ?

## 2024-02-18 MED FILL — KETOROLAC TROMETHAMINE 30 MG/ML IJ SOLN: 30 mg/mL | INTRAMUSCULAR | Qty: 1 | Fill #0

## 2024-02-18 NOTE — Discharge Instructions (Signed)
 Thank you for allowing us  to provide you with medical care today.  We realize that you have many choices for your emergency care needs.  We thank you for choosing [facility].  Please choose us  in the future for any continued health care needs.     We hope we addressed all of your medical concerns. We strive to provide excellent quality care in the Emergency Department.  Anything less than excellent does not meet our expectations.     The exam and treatment you received in the Emergency Department were for an emergent problem and are not intended as complete care. It is important that you follow up with a doctor, nurse practitioner, or physician's assistant for ongoing care. If your symptoms worsen or you do not improve as expected and you are unable to reach your usual health care provider, you should return to the Emergency Department. We are available 24 hours a day.     Take this sheet with you when you go to your follow-up visit.     If you have any problem arranging the follow-up visit, contact the Emergency Department immediately.     Make an appointment your family doctor for follow up of this visit. Return to the ER if you are unable to be seen in a timely manner.     Below is a summary of your results.    Labs  No results found for this or any previous visit (from the past 12 hours).    Radiologic Studies  XR KNEE LEFT (3 VIEWS)   Final Result   Anterior prepatellar and infrapatellar soft tissue swelling. DJD. No   fracture.      Electronically signed by Oneil Fireman, MD

## 2024-02-18 NOTE — ED Provider Notes (Cosign Needed)
 [facility] EMERGENCY DEPARTMENT  EMERGENCY DEPARTMENT ENCOUNTER      Date: 02/18/2024  Patient Name: Breanna Lester  MRN: [id number]  Birthdate [birth year]  Date of evaluation: 02/18/2024  Provider: Burnard FORBES Shutter, APRN - NP   Note Started: 5:52 PM EDT 02/18/24    CHIEF COMPLAINT     Chief Complaint   Patient presents with    Knee Pain              HISTORY OF PRESENT ILLNESS  (Onset, Location, Duration, Character, Alleviating/Aggravating, Radiation, Timing, Severity)   Note limiting factors.   History Provided By: Patient and EMS    HPI: Breanna Lester is a 63 y.o. female with a history of anxiety, depression, heart failure, liver disease, cesarean section, hysterectomy, tonsillectomy presents with left knee pain.  Patient states she was getting out of a car when she missed the curb and fell onto the left knee.  Ambulatory following the event.  Presents with anterior left knee pain.  Denies numbness or weakness.  Also denies head injury or loss of consciousness.  No blood thinner use.  No pain meds prior to arrival.  Worsened by weightbearing.      Nursing Notes and triage vitals were reviewed.  PCP: Unknown, Provider, PA      PAST MEDICAL HISTORY   Past Medical History:  Past Medical History:   Diagnosis Date    Anxiety     Depression     Gastrointestinal disorder     Heart failure (HCC)     Liver disease 2009    liver failure    Pneumonia        Past Surgical History:  Past Surgical History:   Procedure Laterality Date    ADENOIDECTOMY  50    CATARACT REMOVAL      CESAREAN SECTION  1986 and 1993    DILATION AND CURETTAGE OF UTERUS  1999    EGD TRANSORAL BIOPSY SINGLE/MULTIPLE  05/17/2009         HYSTERECTOMY (CERVIX STATUS UNKNOWN)  2008    TONSILLECTOMY  1972       Family History:  Family History   Problem Relation Age of Onset    Cancer Mother     Heart Disease Father        Social History:  Social History     Tobacco Use    Smoking status: Former     Current packs/day: 0.00     Types: Cigarettes     Quit  date: 02/28/2008     Years since quitting: 15.9    Smokeless tobacco: Never   Substance Use Topics    Alcohol use: No       Allergies:  No Known Allergies    Current Meds:   No current facility-administered medications for this encounter.     Current Outpatient Medications   Medication Sig Dispense Refill    naproxen (NAPROSYN) 500 MG tablet Take 1 tablet by mouth 2 times daily 60 tablet 0       Social Determinants of Health:   Social Drivers of Health     Tobacco Use: Medium Risk (12/07/2021)    Patient History     Smoking Tobacco Use: Former     Smokeless Tobacco Use: Never     Passive Exposure: Not on file   Alcohol Use: Not At Risk (02/18/2024)    AUDIT-C     Frequency of Alcohol Consumption: Never     Average Number of Drinks: Patient does  not drink     Frequency of Binge Drinking: Never   Physicist, medical Strain: Not on file   Food Insecurity: Food Insecurity Present (09/01/2022)    Received from [facility], [facility]    Hunger Vital Sign     Worried About Running Out of Food in the Last Year: Sometimes true     Ran Out of Food in the Last Year: Sometimes true   Transportation Needs: No Transportation Needs (09/01/2022)    Received from [facility], [facility]    PRAPARE - Transportation     Lack of Transportation (Medical): No     Lack of Transportation (Non-Medical): No   Physical Activity: Not on file   Stress: Not on file   Social Connections: Not on file   Intimate Partner Violence: Not on file   Depression: Not on file   Housing Stability: High Risk (09/01/2022)    Received from [facility], [facility]    Housing Stability Vital Sign     Unable to Pay for Housing in the Last Year: No     Number of Places Lived in the Last Year: 1     Unstable Housing in the Last Year: Yes   Interpersonal Safety: Not on file   Utilities: At Risk (09/01/2022)    Received from [facility], [facility]    [facility] Utilities     Threatened with loss of utilities: Yes         PHYSICAL EXAM  (up to 7 for level 4, 8 or more for level 5)      Body mass index is 47.55 kg/m.    Physical Exam  Vitals and nursing note reviewed.   Constitutional:       General: She is not in acute distress.     Appearance: Normal appearance. She is obese. She is not ill-appearing.   HENT:      Head: Normocephalic.      Nose: Nose normal.      Mouth/Throat:      Mouth: Mucous membranes are moist.   Eyes:      Extraocular Movements: Extraocular movements intact.      Conjunctiva/sclera: Conjunctivae normal.   Cardiovascular:      Rate and Rhythm: Normal rate and regular rhythm.      Pulses: Normal pulses.   Pulmonary:      Effort: Pulmonary effort is normal. No respiratory distress.   Abdominal:      General: There is no distension.      Palpations: Abdomen is soft.      Tenderness: There is no abdominal tenderness. There is no guarding.   Musculoskeletal:         General: Swelling, tenderness and signs of injury present.      Cervical back: Normal range of motion.      Left knee: Swelling, ecchymosis and bony tenderness present. No deformity or crepitus. Decreased range of motion. Tenderness present over the patellar tendon. Normal alignment. Normal pulse.      Right lower leg: No edema.      Left lower leg: No edema.        Legs:       Comments: Distal pulses and sensation intact.   Skin:     General: Skin is warm and dry.      Findings: Bruising present.   Neurological:      General: No focal deficit present.      Mental Status: She is alert and oriented to  person, place, and time.   Psychiatric:         Mood and Affect: Mood normal.         Behavior: Behavior normal.         DIAGNOSTIC RESULTS     EKG: All EKG's are interpreted by the Emergency Department Physician who either signs or Co-signs this chart in the absence of a cardiologist.  EKG:.Not Applicable       RADIOLOGIC STUDIES:   Non-plain film images such as CT, Ultrasound and MRI are read by the radiologist. Plain radiographic images are visualized and preliminarily interpreted by the ED Provider with the  following findings: See ED Course Below    Interpretation per the Radiologist below, if available at the time of this note:  XR KNEE LEFT (3 VIEWS)   Final Result   Anterior prepatellar and infrapatellar soft tissue swelling. DJD. No   fracture.      Electronically signed by Oneil Fireman, MD           LABS:  Labs Reviewed - No data to display  No results found for this or any previous visit (from the past 12 hours).    All other labs were within normal range or not returned as of this dictation.    EMERGENCY DEPARTMENT COURSE  (Differential Diagnosis / MDM / Reassessment / Consults / Education)   Vitals:    Vitals:    02/18/24 1741   BP: 109/75   Pulse: 89   Resp: 16   Temp: 99.2 F (37.3 C)   TempSrc: Oral   SpO2: 92%   Weight: 117.9 kg (260 lb)   Height: 1.575 m (5' 2)       Medical Decision Making  Amount and/or Complexity of Data Reviewed  Radiology: ordered. Decision-making details documented in ED Course.    Risk  Prescription drug management.      63 year old female presents with left knee pain after fall from standing position.  Ambulatory following the event.  Physical exam with swelling and ecchymosis to the anterior bilateral portions of the left knee.  Range of motion and neurovascular status intact.  No laxity, crepitus, or deformity noted.  Differentials include fracture, effusion, contusion, hematoma, dislocation.  Patient denies dislocating event on scene suspicious for vascular injury.  X-ray shows soft tissue swelling without other acute abnormality.  Given ketorolac for pain ice applied.  Hinged knee brace provided with instruction.  Stable for discharge.  Encouraged follow-up with PCP/orthopedics.  Symptomatic management discussed.  Mobility as tolerated.  Warning signs return precautions discussed.    ED COURSE  ED Course as of 02/18/24 1857   Mon Feb 18, 2024   1801 BP: 109/75 [KW]   1801 Pulse: 89 [KW]   1801 Respirations: 16 [KW]   1801 SpO2: 92 % [KW]   1801 Temp: 99.2 F (37.3 C) [KW]    1851 XR KNEE LEFT (3 VIEWS)  IMPRESSION:  Anterior prepatellar and infrapatellar soft tissue swelling. DJD. No fracture. [KW]      ED Course User Index  [KW] Prentiss Burnard BRAVO, APRN - NP       Sepsis Reassessment: Patient does NOT meet Sepsis criteria after ED workup    CONSULTS:  None    Patient was given the following medications:  Medications   ketorolac (TORADOL) injection 30 mg (30 mg IntraMUSCular Given 02/18/24 1836)       Social Determinants affecting Dx or Tx: None    Smoking Cessation: Not Applicable  Records Reviewed (source and summary of external notes): Prior medical records and Nursing notes.     CLINICAL DECISION TOOLS                   PROCEDURES   Unless otherwise noted above, none  Procedures      CRITICAL CARE TIME   Patient does not meet Critical Care Time, 0 minutes    FINAL IMPRESSION     1. Acute pain of left knee        DISPOSITION / PLAN     DISPOSITION Discharge - Pending Orders Complete 02/18/2024 06:53:18 PM   DISPOSITION CONDITION Stable       Discharge Note: The patient is stable for discharge home. The signs, symptoms, diagnosis, and discharge instructions have been discussed, understanding conveyed, and agreed upon. The patient is to follow up as recommended or return to ER should their symptoms worsen.     PATIENT REFERRED TO:  [facility] Emergency Department  [address]  [place]  [address]  [phone]    If symptoms worsen    [facility]  [facility] [place]  [address]  [place]  [address]  [phone]  Schedule an appointment as soon as possible for a visit       DISCHARGE MEDICATIONS:  New Prescriptions    NAPROXEN (NAPROSYN) 500 MG TABLET    Take 1 tablet by mouth 2 times daily     (Please note that parts of this dictation were completed with voice recognition software. Quite often unanticipated grammatical, syntax, homophones, and other interpretive errors are inadvertently transcribed by the computer software. Efforts were made  to edit the dictation but occasionally words remain mis-transcribed.)    Burnard FORBES Shutter, APRN - NP (electronically signed)  Emergency Attending Physician / Physician Assistant / Nurse Practitioner     Shutter Burnard FORBES, APRN - NP  02/18/24 1857

## 2024-02-18 NOTE — ED Triage Notes (Signed)
 Pt arrives to ED via EMS for left knee pain after fall getting out of car. Painful to bear weight.

## 2024-02-26 ENCOUNTER — Emergency Department: Admit: 2024-02-26 | Payer: Medicare (Managed Care) | Primary: Diagnostic Radiology

## 2024-02-26 ENCOUNTER — Inpatient Hospital Stay
Admit: 2024-02-26 | Discharge: 2024-02-26 | Disposition: A | Discharge status: Home / Self Care | Payer: Medicare (Managed Care) | Arrived: VH | Attending: Emergency Medicine

## 2024-02-26 DIAGNOSIS — E876 Hypokalemia: Principal | ICD-10-CM

## 2024-02-26 DIAGNOSIS — M79605 Pain in left leg: Principal | ICD-10-CM

## 2024-02-26 LAB — COMPREHENSIVE METABOLIC PANEL
ALT: 28 U/L (ref 12–78)
AST: 36 U/L (ref 15–37)
Albumin/Globulin Ratio: 0.8 — ABNORMAL LOW (ref 1.1–2.2)
Albumin: 3.1 g/dL — ABNORMAL LOW (ref 3.5–5.0)
Alk Phosphatase: 117 U/L (ref 45–117)
Anion Gap: 5 mmol/L (ref 2–12)
BUN/Creatinine Ratio: 25 — ABNORMAL HIGH (ref 12–20)
BUN: 23 mg/dL — ABNORMAL HIGH (ref 6–20)
CO2: 35 mmol/L — ABNORMAL HIGH (ref 21–32)
Calcium: 9.1 mg/dL (ref 8.5–10.1)
Chloride: 103 mmol/L (ref 97–108)
Creatinine: 0.93 mg/dL (ref 0.55–1.02)
Est, Glom Filt Rate: 69 ml/min/1.73m2 (ref >60)
Globulin: 4.1 g/dL — ABNORMAL HIGH (ref 2.0–4.0)
Glucose: 158 mg/dL — ABNORMAL HIGH (ref 65–100)
Potassium: 3.3 mmol/L — ABNORMAL LOW (ref 3.5–5.1)
Sodium: 143 mmol/L (ref 136–145)
Total Bilirubin: 0.7 mg/dL (ref 0.2–1.0)
Total Protein: 7.2 g/dL (ref 6.4–8.2)

## 2024-02-26 LAB — CBC WITH AUTO DIFFERENTIAL
Basophils %: 0.8 % (ref 0.0–1.0)
Basophils Absolute: 0.05 K/UL (ref 0.00–0.10)
Eosinophils %: 1.1 % (ref 0.0–7.0)
Eosinophils Absolute: 0.07 K/UL (ref 0.00–0.40)
Hematocrit: 41.9 % (ref 35.0–47.0)
Hemoglobin: 13.6 g/dL (ref 11.5–16.0)
Immature Granulocytes %: 0.3 % (ref 0–0.5)
Immature Granulocytes Absolute: 0.02 K/UL (ref 0.00–0.04)
Lymphocytes %: 21.9 % (ref 12.0–49.0)
Lymphocytes Absolute: 1.44 K/UL (ref 0.80–3.50)
MCH: 27.8 pg (ref 26.0–34.0)
MCHC: 32.5 g/dL (ref 30.0–36.5)
MCV: 85.7 FL (ref 80.0–99.0)
MPV: 12 FL (ref 8.9–12.9)
Monocytes %: 7.6 % (ref 5.0–13.0)
Monocytes Absolute: 0.5 K/UL (ref 0.00–1.00)
Neutrophils %: 68.3 % (ref 32.0–75.0)
Neutrophils Absolute: 4.5 K/UL (ref 1.80–8.00)
Nucleated RBCs: 0 /100{WBCs}
Platelets: 101 K/uL — ABNORMAL LOW (ref 150–400)
RBC: 4.89 M/uL (ref 3.80–5.20)
RDW: 14.2 % (ref 11.5–14.5)
WBC: 6.6 K/uL (ref 3.6–11.0)
nRBC: 0 K/uL (ref 0.00–0.01)

## 2024-02-26 LAB — CK: Total CK: 76 U/L (ref 26–192)

## 2024-02-26 MED ORDER — POTASSIUM CHLORIDE ER 10 MEQ PO TBCR
10 | Freq: Once | ORAL | Status: AC
Start: 2024-02-26 — End: 2024-02-26
  Administered 2024-02-26: 22:00:00 40 meq via ORAL

## 2024-02-26 MED ORDER — ACETAMINOPHEN 325 MG PO TABS
325 | ORAL_TABLET | Freq: Four times a day (QID) | ORAL | 0 refills | Status: AC | PRN
Start: 2024-02-26 — End: 2024-03-07

## 2024-02-26 MED FILL — POTASSIUM CHLORIDE ER 10 MEQ PO TBCR: 10 meq | ORAL | Qty: 4

## 2024-02-26 NOTE — ED Triage Notes (Signed)
 Sen Monday for fall and left knee pain now having increasing pain in knee and bruising down leg

## 2024-02-26 NOTE — ED Provider Notes (Signed)
 [facility] EMERGENCY DEPARTMENT  EMERGENCY DEPARTMENT ENCOUNTER      Pt Name: Breanna Lester  MRN: [id number]  Birthdate September 07, 1960  Date of evaluation: 02/26/2024  Provider: Morene DASEN Zora Glendenning, PA-C    CHIEF COMPLAINT       Chief Complaint   Patient presents with    Leg Pain     left         HISTORY OF PRESENT ILLNESS   (Location/Symptom, Timing/Onset, Context/Setting, Quality, Duration, Modifying Factors, Severity)  Note limiting factors.   Is a 63 year old female with history of CAD, heart failure, liver disease, and heart arrhythmia presenting due to worsening left leg bruising, swelling, pain x 8 days.  She reports that last Monday she tripped and fell on her left knee.  She presented to the emergency room for evaluation.  She was told to return for worsened symptoms.  Her pain went away but began worsening over the past few days.  She also developed significant bruising of her left lower leg.  She is concerned she may have a blood clot.    She reports she is supposed to be on Eliquis for a heart arrhythmia but discontinued it 3 months ago on her own so that she could take Advil for her chronic back pain.    She otherwise denies urinary/stool incontinence, change in her chronic back pain, saddle anesthesia, fevers.            Review of External Medical Records:     Nursing Notes were reviewed.    REVIEW OF SYSTEMS    (2-9 systems for level 4, 10 or more for level 5)     Review of Systems    Except as noted above the remainder of the review of systems was reviewed and negative.       PAST MEDICAL HISTORY     Past Medical History:   Diagnosis Date    Anxiety     Depression     Gastrointestinal disorder     Heart failure (HCC)     Liver disease 2009    liver failure    Pneumonia          SURGICAL HISTORY       Past Surgical History:   Procedure Laterality Date    ADENOIDECTOMY  20    CATARACT REMOVAL      CESAREAN SECTION  1986 and 1993    DILATION AND CURETTAGE OF UTERUS  1999    EGD TRANSORAL BIOPSY  SINGLE/MULTIPLE  05/17/2009         HYSTERECTOMY (CERVIX STATUS UNKNOWN)  2008    TONSILLECTOMY  1972         CURRENT MEDICATIONS       Discharge Medication List as of 02/26/2024  7:46 PM        CONTINUE these medications which have NOT CHANGED    Details   naproxen  (NAPROSYN ) 500 MG tablet Take 1 tablet by mouth 2 times daily, Disp-60 tablet, R-0Normal             ALLERGIES     Patient has no known allergies.    FAMILY HISTORY       Family History   Problem Relation Age of Onset    Cancer Mother     Heart Disease Father           SOCIAL HISTORY       Social History     Socioeconomic History    Marital status: Divorced   Tobacco  Use    Smoking status: Former     Current packs/day: 0.00     Types: Cigarettes     Quit date: 02/28/2008     Years since quitting: 16.0    Smokeless tobacco: Never   Substance and Sexual Activity    Alcohol use: No     Social Drivers of Engineer, water Insecurity: Food Insecurity Present (09/01/2022)    [facility]    Hunger Vital Sign     Worried About Running Out of Food in the Last Year: Sometimes true     Ran Out of Food in the Last Year: Sometimes true   Transportation Needs: No Transportation Needs (09/01/2022)    Received from [facility], [facility]    PRAPARE - Transportation     Lack of Transportation (Medical): No     Lack of Transportation (Non-Medical): No   Housing Stability: High Risk (09/01/2022)    Received from [facility], [facility]    Housing Stability Vital Sign     Unable to Pay for Housing in the Last Year: No     Number of Places Lived in the Last Year: 1     Unstable Housing in the Last Year: Yes           PHYSICAL EXAM    (up to 7 for level 4, 8 or more for level 5)     ED Triage Vitals [02/26/24 1628]   BP Systolic BP Percentile Diastolic BP Percentile Temp Temp Source Pulse Respirations SpO2   (!) 157/70 -- -- 98.2 F (36.8 C) Oral 74 20 94 %      Height Weight - Scale         1.575 m (5' 2) 113.4 kg (250 lb)             Body mass index is  45.73 kg/m.    Physical Exam  Vitals and nursing note reviewed.   Constitutional:       General: She is not in acute distress.     Appearance: Normal appearance. She is not ill-appearing, toxic-appearing or diaphoretic.   HENT:      Head: Normocephalic.      Nose: Nose normal.      Mouth/Throat:      Mouth: Mucous membranes are moist.   Eyes:      Extraocular Movements: Extraocular movements intact.      Conjunctiva/sclera: Conjunctivae normal.      Pupils: Pupils are equal, round, and reactive to light.   Cardiovascular:      Rate and Rhythm: Normal rate.      Pulses: Normal pulses.   Pulmonary:      Effort: Pulmonary effort is normal.   Musculoskeletal:         General: Normal range of motion.      Cervical back: Normal range of motion.      Comments: 2+ left lower extremity edema, 1+ right lower extremity edema.  There is significant ecchymosis of the left lower leg.  There is tenderness to palpation of the left fibular head.  Left ankle is nontender.  Left dorsalis pedis pulses 2+, capillary refill less than 2 seconds.  Sensation intact.  Strength and active range of motion are intact.   Skin:     General: Skin is warm and dry.      Capillary Refill: Capillary refill takes less than 2 seconds.   Neurological:  Mental Status: She is alert and oriented to person, place, and time. Mental status is at baseline.   Psychiatric:         Mood and Affect: Mood normal.         Behavior: Behavior normal.         DIAGNOSTIC RESULTS     EKG: All EKG's are interpreted by the Emergency Department Physician who either signs or Co-signs this chart in the absence of a cardiologist.        RADIOLOGY:   Non-plain film images such as CT, Ultrasound and MRI are read by the radiologist. Plain radiographic images are visualized and preliminarily interpreted by the emergency physician with the below findings:        Interpretation per the Radiologist below, if available at the time of this note:    Vascular duplex lower extremity  venous left         XR TIBIA FIBULA LEFT (2 VIEWS)   Final Result   No acute abnormality.      Electronically signed by Oneil Fireman, MD           LABS:  Labs Reviewed   CBC WITH AUTO DIFFERENTIAL - Abnormal; Notable for the following components:       Result Value    Platelets 101 (*)     All other components within normal limits   COMPREHENSIVE METABOLIC PANEL - Abnormal; Notable for the following components:    Potassium 3.3 (*)     CO2 35 (*)     Glucose 158 (*)     BUN 23 (*)     BUN/Creatinine Ratio 25 (*)     Albumin 3.1 (*)     Globulin 4.1 (*)     Albumin/Globulin Ratio 0.8 (*)     All other components within normal limits   CK       All other labs were within normal range or not returned as of this dictation.    EMERGENCY DEPARTMENT COURSE and DIFFERENTIAL DIAGNOSIS/MDM:   Vitals:    Vitals:    02/26/24 1628   BP: (!) 157/70   Pulse: 74   Resp: 20   Temp: 98.2 F (36.8 C)   TempSrc: Oral   SpO2: 94%   Weight: 113.4 kg (250 lb)   Height: 1.575 m (5' 2)           Medical Decision Making  Is a 63 year old female with history of CAD, heart failure, liver disease, and heart arrhythmia presenting due to worsening left leg bruising, swelling, pain x 8 days.  Vitals unremarkable.  History, exam, labs, and imaging are reassuring against rhabdomyolysis, DVT, tibia/fibula fracture, arterial occlusion, soft tissue infection.  She appears to have residual swelling and bruising from her injury last week.  Recommend she follow-up with her PCP and elevate the leg for the swelling.  Return precautions given.      I advised her to stop taking NSAIDs and reinitiate her Eliquis for stroke prophylaxis.  She does not appear to have cauda equina, an epidural abscess, or myelopathy.  I recommend she follow-up with a spine specialist for her back pain.    Amount and/or Complexity of Data Reviewed  Labs: ordered.  Radiology: ordered.    Risk  OTC drugs.  Prescription drug management.            REASSESSMENT             CONSULTS:  None    PROCEDURES:  Unless  otherwise noted below, none     Procedures      FINAL IMPRESSION      1. Left leg pain    2. Hypokalemia          DISPOSITION/PLAN   DISPOSITION Decision To Discharge 02/26/2024 07:44:22 PM      PATIENT REFERRED TO:  [facility] Emergency Department  [address]  [place]  [address]  [phone]    If symptoms worsen    Vanichkachorn, Jed, MD  [address]  [place] [address]  [phone]    Schedule an appointment as soon as possible for a visit   for your back pain    Your Family Doctor    Schedule an appointment as soon as possible for a visit         DISCHARGE MEDICATIONS:  Discharge Medication List as of 02/26/2024  7:46 PM        START taking these medications    Details   acetaminophen  (TYLENOL ) 325 MG tablet Take 1 tablet by mouth every 6 hours as needed for Pain, Disp-40 tablet, R-0Normal               (Please note that portions of this note were completed with a voice recognition program.  Efforts were made to edit the dictations but occasionally words are mis-transcribed.)    Morene ONEIDA Arn, PA-C (electronically signed)  Emergency Attending Physician / Physician Assistant / Nurse Practitioner             Arn Morene ONEIDA, PA-C  02/26/24 2011

## 2024-02-26 NOTE — Discharge Instructions (Addendum)
 Follow-up with your family doctor.  You may also follow-up with the back specialist listed.  Continue to take Tylenol  as needed for your pain. Elevate the leg for the swelling.

## 2024-02-26 NOTE — ED Notes (Signed)
 The patient was discharged home by Gerlach, [place] and [place], rn in stable condition. The patient is alert and oriented, is in no respiratory distress. The patient's diagnosis, condition and treatment were explained to patient. The patient expressed understanding. No prescriptions given to pt. No work/school note given to pt. A discharge plan has been developed. A case manager was not involved in the process. Aftercare instructions were given to the patient.

## 2024-02-27 LAB — VAS DUP LOWER EXTREMITY VENOUS LEFT: Body Surface Area: 2.23 m2

## 2024-04-08 ENCOUNTER — Observation Stay: Admit: 2024-04-09 | Payer: Medicare (Managed Care) | Primary: Diagnostic Radiology

## 2024-04-08 ENCOUNTER — Observation Stay: Admit: 2024-04-08 | Payer: Medicare (Managed Care) | Primary: Diagnostic Radiology

## 2024-04-08 ENCOUNTER — Emergency Department: Admit: 2024-04-08 | Payer: Medicare (Managed Care) | Primary: Diagnostic Radiology

## 2024-04-08 ENCOUNTER — Inpatient Hospital Stay
Admit: 2024-04-08 | Discharge: 2024-04-11 | Disposition: A | Discharge status: Home / Self Care | Payer: Medicare (Managed Care) | Admitting: Student in an Organized Health Care Education/Training Program

## 2024-04-08 DIAGNOSIS — J9601 Acute respiratory failure with hypoxia: Secondary | ICD-10-CM

## 2024-04-08 DIAGNOSIS — J441 Chronic obstructive pulmonary disease with (acute) exacerbation: Principal | ICD-10-CM

## 2024-04-08 LAB — RESPIRATORY PANEL, MOLECULAR, WITH COVID-19

## 2024-04-08 LAB — ECHO (TTE) COMPLETE (PRN CONTRAST/BUBBLE/STRAIN/3D)
AV Area by Peak Velocity: 2.6 cm2
AV Area by VTI: 2.9 cm2
AV Mean Gradient: 7 mmHg
AV Mean Velocity: 1.2 m/s
AV Peak Gradient: 13 mmHg
AV Peak Velocity: 1.8 m/s
AV VTI: 36.4 cm
AV Velocity Ratio: 0.83
AVA/BSA Peak Velocity: 1.2 cm2/m2
AVA/BSA VTI: 1.3 cm2/m2
Aortic Arch: 2.6 cm
Ascending Aorta Index: 1.28 cm/m2
Ascending Aorta: 2.8 cm
Body Surface Area: 2.33 m2
E/E' Lateral: 11.13
E/E' Ratio (Averaged): 11.81
E/E' Septal: 12.5
EF BP: 61 % (ref 55–100)
EF Physician: 61 %
Est. RA Pressure: 3 mmHg
Fractional Shortening 2D: 34 % (ref 28–44)
IVSd: 0.7 cm (ref 0.6–0.9)
LA Diameter: 3.2 cm
LA Size Index: 1.46 cm/m2
LA Volume A-L A4C: 60 mL — AB (ref 22–52)
LA Volume A-L A4C: 67 mL — AB (ref 22–52)
LA Volume A/L: 65 mL
LA Volume BP: 61 mL — AB (ref 22–52)
LA Volume Index A-L A2C: 27 mL/m2 (ref 16–34)
LA Volume Index A-L A4C: 31 mL/m2 (ref 16–34)
LA Volume Index A/L: 30 mL/m2 (ref 16–34)
LA Volume Index BP: 28 mL/m2 (ref 16–34)
LA Volume Index MOD A2C: 26 mL/m2 (ref 16–34)
LA Volume Index MOD A4C: 29 mL/m2 (ref 16–34)
LA Volume MOD A2C: 56 mL — AB (ref 22–52)
LA Volume MOD A4C: 63 mL — AB (ref 22–52)
LV E' Lateral Velocity: 7.55 cm/s
LV E' Septal Velocity: 6.72 cm/s
LV EDV A2C: 86 mL
LV EDV A4C: 100 mL
LV EDV BP: 92 mL (ref 56–104)
LV EDV Index A2C: 39 mL/m2
LV EDV Index A4C: 46 mL/m2
LV EDV Index BP: 42 mL/m2
LV ESV A2C: 37 mL
LV ESV A4C: 33 mL
LV ESV BP: 36 mL (ref 19–49)
LV ESV Index A2C: 17 mL/m2
LV ESV Index A4C: 15 mL/m2
LV ESV Index BP: 16 mL/m2
LV Ejection Fraction A2C: 57 %
LV Ejection Fraction A4C: 68 %
LV Mass 2D Index: 52.4 g/m2 (ref 43–95)
LV Mass 2D: 114.7 g (ref 67–162)
LV RWT Ratio: 0.28
LVIDd Index: 2.28 cm/m2
LVIDd: 5 cm (ref 3.9–5.3)
LVIDs Index: 1.51 cm/m2
LVIDs: 3.3 cm
LVOT Area: 3.1 cm2
LVOT Diameter: 2 cm
LVOT Mean Gradient: 5 mmHg
LVOT Peak Gradient: 9 mmHg
LVOT Peak Velocity: 1.5 m/s
LVOT SV: 104.9 mL
LVOT Stroke Volume Index: 47.9 mL/m2
LVOT VTI: 33.4 cm
LVOT:AV VTI Index: 0.92
LVPWd: 0.7 cm (ref 0.6–0.9)
MV A Velocity: 0.92 m/s
MV E Velocity: 0.84 m/s
MV E Wave Deceleration Time: 227.8 ms
MV E/A: 0.91
PV Max Velocity: 1.3 m/s
PV Peak Gradient: 7 mmHg
RV Free Wall Peak S': 12.8 cm/s
RVOT Mean Gradient: 2 mmHg
RVOT Peak Gradient: 4 mmHg
RVOT Peak Velocity: 1 m/s
RVOT VTI: 23 cm
TAPSE: 2.5 cm (ref 1.7–?)

## 2024-04-08 LAB — COMPREHENSIVE METABOLIC PANEL
ALT: 17 U/L (ref 10–35)
AST: 42 U/L — ABNORMAL HIGH (ref 10–35)
Albumin/Globulin Ratio: 1.1 (ref 1.1–2.2)
Albumin: 3.3 g/dL — ABNORMAL LOW (ref 3.5–5.2)
Alk Phosphatase: 124 U/L — ABNORMAL HIGH (ref 35–104)
Anion Gap: 9 mmol/L (ref 2–12)
BUN/Creatinine Ratio: 28 — ABNORMAL HIGH (ref 12–20)
BUN: 18 mg/dL (ref 8–23)
CO2: 25 mmol/L (ref 22–29)
Calcium: 8.6 mg/dL — ABNORMAL LOW (ref 8.8–10.2)
Chloride: 107 mmol/L (ref 98–107)
Creatinine: 0.64 mg/dL (ref 0.50–0.90)
Est, Glom Filt Rate: >90 ml/min/1.73m2 (ref >60)
Globulin: 3.1 g/dL (ref 2.0–4.0)
Glucose: 137 mg/dL — ABNORMAL HIGH (ref 65–100)
Potassium: 3.8 mmol/L (ref 3.5–5.1)
Sodium: 141 mmol/L (ref 136–145)
Total Bilirubin: 0.5 mg/dL (ref 0.0–1.2)
Total Protein: 6.4 g/dL (ref 6.4–8.3)

## 2024-04-08 LAB — CBC WITH AUTO DIFFERENTIAL
Basophils %: 0.4 % (ref 0.0–1.0)
Basophils Absolute: 0.02 K/UL (ref 0.00–0.10)
Eosinophils %: 1.2 % (ref 0.0–7.0)
Eosinophils Absolute: 0.06 K/UL (ref 0.00–0.40)
Hematocrit: 36.9 % (ref 35.0–47.0)
Hemoglobin: 11.5 g/dL (ref 11.5–16.0)
Immature Granulocytes %: 0.2 % (ref 0.0–0.5)
Immature Granulocytes Absolute: 0.01 K/UL (ref 0.00–0.04)
Lymphocytes %: 20.1 % (ref 12.0–49.0)
Lymphocytes Absolute: 1.01 K/UL (ref 0.80–3.50)
MCH: 27.4 pg (ref 26.0–34.0)
MCHC: 31.2 g/dL (ref 30.0–36.5)
MCV: 88.1 FL (ref 80.0–99.0)
MPV: 11.3 FL (ref 8.9–12.9)
Monocytes %: 8.6 % (ref 5.0–13.0)
Monocytes Absolute: 0.43 K/UL (ref 0.00–1.00)
Neutrophils %: 69.5 % (ref 32.0–75.0)
Neutrophils Absolute: 3.48 K/UL (ref 1.80–8.00)
Nucleated RBCs: 0 /100{WBCs}
Platelets: 81 K/uL — ABNORMAL LOW (ref 150–400)
RBC: 4.19 M/uL (ref 3.80–5.20)
RDW: 14.1 % (ref 11.5–14.5)
WBC: 5 K/uL (ref 3.6–11.0)
nRBC: 0 K/uL (ref 0.00–0.01)

## 2024-04-08 LAB — BRAIN NATRIURETIC PEPTIDE: NT Pro-BNP: 353 pg/mL — ABNORMAL HIGH (ref <126)

## 2024-04-08 LAB — D-DIMER, RAPID: D-Dimer, Quant: 0.34 ug{FEU}/mL (ref <0.50)

## 2024-04-08 LAB — MAGNESIUM: Magnesium: 1.9 mg/dL (ref 1.6–2.4)

## 2024-04-08 LAB — TROPONIN: Troponin T: 12.6 ng/L (ref 0–14)

## 2024-04-08 MED ORDER — POTASSIUM CHLORIDE ER 10 MEQ PO TBCR
10 | ORAL | Status: DC | PRN
Start: 2024-04-08 — End: 2024-04-11

## 2024-04-08 MED ORDER — POTASSIUM CHLORIDE 10 MEQ/100ML IV SOLN
10 | INTRAVENOUS | Status: DC | PRN
Start: 2024-04-08 — End: 2024-04-11

## 2024-04-08 MED ORDER — APIXABAN 5 MG PO TABS
5 | Freq: Two times a day (BID) | ORAL | Status: DC
Start: 2024-04-08 — End: 2024-04-11
  Administered 2024-04-09 – 2024-04-11 (×6): 5 mg via ORAL

## 2024-04-08 MED ORDER — BUDESONIDE 0.5 MG/2ML IN SUSP
0.5 | Freq: Two times a day (BID) | RESPIRATORY_TRACT | Status: DC
Start: 2024-04-08 — End: 2024-04-11
  Administered 2024-04-08 – 2024-04-11 (×6): 500 mg via RESPIRATORY_TRACT

## 2024-04-08 MED ORDER — CHLORTHALIDONE 25 MG PO TABS
25 | Freq: Every day | ORAL | Status: DC
Start: 2024-04-08 — End: 2024-04-11
  Administered 2024-04-09 – 2024-04-11 (×3): 25 mg via ORAL

## 2024-04-08 MED ORDER — SODIUM CHLORIDE 0.9 % IV SOLN
0.9 | INTRAVENOUS | Status: DC | PRN
Start: 2024-04-08 — End: 2024-04-11

## 2024-04-08 MED ORDER — METHOCARBAMOL 500 MG PO TABS
500 | Freq: Four times a day (QID) | ORAL | Status: DC | PRN
Start: 2024-04-08 — End: 2024-04-11

## 2024-04-08 MED ORDER — ESCITALOPRAM OXALATE 10 MG PO TABS
10 | Freq: Every evening | ORAL | Status: DC
Start: 2024-04-08 — End: 2024-04-11
  Administered 2024-04-09 – 2024-04-10 (×3): 5 mg via ORAL

## 2024-04-08 MED ORDER — METHOCARBAMOL 750 MG PO TABS
750 | Freq: Four times a day (QID) | ORAL | Status: DC | PRN
Start: 2024-04-08 — End: 2024-04-08

## 2024-04-08 MED ORDER — NORMAL SALINE FLUSH 0.9 % IV SOLN
0.9 | INTRAVENOUS | Status: DC | PRN
Start: 2024-04-08 — End: 2024-04-11

## 2024-04-08 MED ORDER — ACETAMINOPHEN 650 MG RE SUPP
650 | Freq: Four times a day (QID) | RECTAL | Status: DC | PRN
Start: 2024-04-08 — End: 2024-04-11

## 2024-04-08 MED ORDER — SERTRALINE HCL 50 MG PO TABS
50 | Freq: Every day | ORAL | Status: DC
Start: 2024-04-08 — End: 2024-04-11
  Administered 2024-04-09 – 2024-04-11 (×3): 100 mg via ORAL

## 2024-04-08 MED ORDER — NICOTINE POLACRILEX 2 MG MT LOZG
2 | OROMUCOSAL | Status: DC | PRN
Start: 2024-04-08 — End: 2024-04-08

## 2024-04-08 MED ORDER — IPRATROPIUM-ALBUTEROL 0.5-2.5 (3) MG/3ML IN SOLN
0.5-2.5 | RESPIRATORY_TRACT | Status: DC
Start: 2024-04-08 — End: 2024-04-09
  Administered 2024-04-08 (×2): 1 via RESPIRATORY_TRACT

## 2024-04-08 MED ORDER — POLYETHYLENE GLYCOL 3350 17 G PO PACK
17 | Freq: Every day | ORAL | Status: DC | PRN
Start: 2024-04-08 — End: 2024-04-11

## 2024-04-08 MED ORDER — ONDANSETRON 4 MG PO TBDP
4 | Freq: Three times a day (TID) | ORAL | Status: DC | PRN
Start: 2024-04-08 — End: 2024-04-11

## 2024-04-08 MED ORDER — ALUM & MAG HYDROXIDE-SIMETH 200-200-20 MG/5ML PO SUSP
200-200-20 | Freq: Four times a day (QID) | ORAL | Status: DC | PRN
Start: 2024-04-08 — End: 2024-04-11

## 2024-04-08 MED ORDER — OXYCODONE HCL 5 MG PO TABS
5 | ORAL | Status: DC | PRN
Start: 2024-04-08 — End: 2024-04-11
  Administered 2024-04-08 – 2024-04-11 (×10): 5 mg via ORAL

## 2024-04-08 MED ORDER — MAGNESIUM SULFATE 2000 MG/50 ML IVPB PREMIX
2 | INTRAVENOUS | Status: DC | PRN
Start: 2024-04-08 — End: 2024-04-11

## 2024-04-08 MED ORDER — IOPAMIDOL 76 % IV SOLN
76 | Freq: Once | INTRAVENOUS | Status: AC | PRN
Start: 2024-04-08 — End: 2024-04-08
  Administered 2024-04-08: 09:00:00 100 mL via INTRAVENOUS

## 2024-04-08 MED ORDER — ENOXAPARIN SODIUM 30 MG/0.3ML IJ SOSY
30 | Freq: Two times a day (BID) | INTRAMUSCULAR | Status: DC
Start: 2024-04-08 — End: 2024-04-08
  Administered 2024-04-08: 15:00:00 30 mg via SUBCUTANEOUS

## 2024-04-08 MED ORDER — SULFUR HEXAFLUORIDE MICROSPH 60.7-25 MG IJ SUSR
60.7-25 | Freq: Once | INTRAMUSCULAR | Status: AC | PRN
Start: 2024-04-08 — End: 2024-04-08
  Administered 2024-04-08: 21:00:00 2 mL via INTRAVENOUS

## 2024-04-08 MED ORDER — ONDANSETRON HCL 4 MG/2ML IJ SOLN
4 | Freq: Four times a day (QID) | INTRAMUSCULAR | Status: DC | PRN
Start: 2024-04-08 — End: 2024-04-11

## 2024-04-08 MED ORDER — PREDNISONE 20 MG PO TABS
20 | Freq: Every day | ORAL | Status: DC
Start: 2024-04-08 — End: 2024-04-11
  Administered 2024-04-08 – 2024-04-11 (×4): 40 mg via ORAL

## 2024-04-08 MED ORDER — FLUTICASONE PROPIONATE 50 MCG/ACT NA SUSP
50 | Freq: Every day | NASAL | Status: DC | PRN
Start: 2024-04-08 — End: 2024-04-11

## 2024-04-08 MED ORDER — AZITHROMYCIN 500 MG IV SOLR
500 | INTRAVENOUS | Status: AC
Start: 2024-04-08 — End: 2024-04-10
  Administered 2024-04-08 – 2024-04-10 (×3): 500 mg via INTRAVENOUS

## 2024-04-08 MED ORDER — IPRATROPIUM-ALBUTEROL 0.5-2.5 (3) MG/3ML IN SOLN
0.5-2.5 | RESPIRATORY_TRACT | Status: AC
Start: 2024-04-08 — End: 2024-04-08
  Administered 2024-04-08: 08:00:00 1 via RESPIRATORY_TRACT

## 2024-04-08 MED ORDER — MELATONIN 3 MG PO TABS
3 | Freq: Every evening | ORAL | Status: DC | PRN
Start: 2024-04-08 — End: 2024-04-11

## 2024-04-08 MED ORDER — METHYLPREDNISOLONE SODIUM SUCC 40 MG IJ SOLR
40 | Freq: Three times a day (TID) | INTRAMUSCULAR | Status: DC
Start: 2024-04-08 — End: 2024-04-08

## 2024-04-08 MED ORDER — ACETAMINOPHEN 325 MG PO TABS
325 | Freq: Four times a day (QID) | ORAL | Status: DC | PRN
Start: 2024-04-08 — End: 2024-04-11
  Administered 2024-04-08 – 2024-04-11 (×6): 650 mg via ORAL

## 2024-04-08 MED ORDER — METHYLPREDNISOLONE SODIUM SUCC 125 MG IJ SOLR
125 | INTRAMUSCULAR | Status: AC
Start: 2024-04-08 — End: 2024-04-08
  Administered 2024-04-08: 08:00:00 125 mg via INTRAVENOUS

## 2024-04-08 MED ORDER — POTASSIUM BICARB-CITRIC ACID 20 MEQ PO TBEF
20 | ORAL | Status: DC | PRN
Start: 2024-04-08 — End: 2024-04-11

## 2024-04-08 MED ORDER — NORMAL SALINE FLUSH 0.9 % IV SOLN
0.9 | Freq: Two times a day (BID) | INTRAVENOUS | Status: DC
Start: 2024-04-08 — End: 2024-04-11
  Administered 2024-04-09 – 2024-04-11 (×6): 10 mL via INTRAVENOUS

## 2024-04-08 MED FILL — ACETAMINOPHEN 325 MG PO TABS: 325 mg | ORAL | Qty: 2

## 2024-04-08 MED FILL — METHYLPREDNISOLONE SODIUM SUCC 125 MG IJ SOLR: 125 mg | INTRAMUSCULAR | Qty: 125

## 2024-04-08 MED FILL — NICOTINE POLACRILEX 2 MG MT LOZG: 2 mg | OROMUCOSAL | Qty: 1

## 2024-04-08 MED FILL — IPRATROPIUM-ALBUTEROL 0.5-2.5 (3) MG/3ML IN SOLN: 0.5-2.5 (3) MG/3ML | RESPIRATORY_TRACT | Qty: 3

## 2024-04-08 MED FILL — OXYCODONE HCL 5 MG PO TABS: 5 mg | ORAL | Qty: 1

## 2024-04-08 MED FILL — LUMASON 60.7-25 MG IJ SUSR: 60.7-25 mg | INTRAMUSCULAR | Qty: 2

## 2024-04-08 MED FILL — AZITHROMYCIN 500 MG IV SOLR: 500 mg | INTRAVENOUS | Qty: 500

## 2024-04-08 MED FILL — METHOCARBAMOL 500 MG PO TABS: 500 mg | ORAL | Qty: 1

## 2024-04-08 MED FILL — BUDESONIDE 0.5 MG/2ML IN SUSP: 0.5 MG/2ML | RESPIRATORY_TRACT | Qty: 2

## 2024-04-08 MED FILL — MELATONIN 3 MG PO TABS: 3 mg | ORAL | Qty: 1

## 2024-04-08 MED FILL — ENOXAPARIN SODIUM 30 MG/0.3ML IJ SOSY: 30 MG/0.3ML | INTRAMUSCULAR | Qty: 0.3

## 2024-04-08 MED FILL — BD POSIFLUSH 0.9 % IV SOLN: 0.9 % | INTRAVENOUS | Qty: 40

## 2024-04-08 MED FILL — PREDNISONE 20 MG PO TABS: 20 mg | ORAL | Qty: 2

## 2024-04-08 NOTE — ED Notes (Signed)
 TRANSFER - OUT REPORT:    Verbal report given to Mckayla on Breanna Lester  being transferred to [facility] ED for routine progression of patient care       Report consisted of patient's Situation, Background, Assessment and   Recommendations(SBAR).     Information from the following report(s) ED Encounter Summary, ED SBAR, [facility], and Recent Results was reviewed with the receiving nurse.    Kinder Fall Assessment:    Presents to emergency department  because of falls (Syncope, seizure, or loss of consciousness): No  Age > 70: No  Altered Mental Status, Intoxication with alcohol or substance confusion (Disorientation, impaired judgment, poor safety awaremess, or inability to follow instructions): No  Impaired Mobility: Ambulates or transfers with assistive devices or assistance; Unable to ambulate or transer.: No  Nursing Judgement: No          Lines:   Peripheral IV 04/08/24 Posterior;Right Forearm (Active)   Site Assessment Clean, dry & intact 04/08/24 0248   Line Status Flushed;Normal saline locked;Specimen collected;Blood return noted 04/08/24 0248   Phlebitis Assessment No symptoms 04/08/24 0248   Infiltration Assessment 0 04/08/24 0248   Dressing Status New dressing applied;Clean, dry & intact 04/08/24 0248   Dressing Type Transparent 04/08/24 0248   Dressing Intervention New 04/08/24 0248        Opportunity for questions and clarification was provided.      Patient transported with:  [facility]

## 2024-04-08 NOTE — Other (Signed)
 1/2 BC pos for strep via PCR. Possible contaminant. Will add ceftriaxone. Repeat cultures ordered.

## 2024-04-08 NOTE — H&P (Signed)
 History and Physical    Date of Service:  04/08/2024  Primary Care Provider: Unknown, Provider, NP-C    Chief Complaint: Shortness of Breath      History of Presenting Illness:   Breanna Lester is a 63 y.o. female who presents with dyspnea. Reports has been occurring for four days, progressing. Could not walk 15 steps without being more dyspneic. Very dyspneic after showering. Called 911. Felt better after nebulizers but dyspneic. Reports symptoms are worsened by allergies and ran out of her inhaler. Having sharp chest pains occasionally.    More yellow-white sputum. Having a cough. Smoked 20 years ago, about 20 pack years.       REVIEW OF SYSTEMS:  Relevant ROS has been mentioned in the HPI    Past Medical History:   Diagnosis Date    Anxiety     Depression     Gastrointestinal disorder     Heart failure (HCC)     Liver disease 2009    liver failure    Pneumonia       Past Surgical History:   Procedure Laterality Date    ADENOIDECTOMY  1972    CATARACT REMOVAL      CESAREAN SECTION  1986 and 1993    DILATION AND CURETTAGE OF UTERUS  1999    EGD TRANSORAL BIOPSY SINGLE/MULTIPLE  05/17/2009         HYSTERECTOMY (CERVIX STATUS UNKNOWN)  2008    TONSILLECTOMY  1972     Prior to Admission medications   Medication Sig Start Date End Date Taking? Authorizing Provider   chlorthalidone  (HYGROTON ) 25 MG tablet Take 1 tablet by mouth daily   Yes [provider]   sertraline (ZOLOFT) 100 MG tablet Take 1 tablet by mouth daily   Yes [provider]   apixaban  (ELIQUIS ) 5 MG TABS tablet Take 1 tablet by mouth 2 times daily   Yes [provider]   escitalopram  (LEXAPRO ) 5 MG tablet Take 1 tablet by mouth daily   Yes [provider]   naproxen  (NAPROSYN ) 500 MG tablet Take 1 tablet by mouth 2 times daily 02/18/24  Yes Prentiss Burnard BRAVO, APRN - NP   acetaminophen  (TYLENOL ) 325 MG tablet Take 1 tablet by mouth every 6 hours as needed for Pain 02/26/24 03/07/24  Cutchins, Morene DASEN, PA-C      Allergies   Allergen Reactions    Latex Rash      Family History   Problem Relation Age of Onset    Cancer Mother     Heart Disease Father       Social History:  reports that she quit smoking about 16 years ago. Her smoking use included cigarettes. She has never used smokeless tobacco. She reports that she does not drink alcohol.   Social Drivers of Health     Tobacco Use: Medium Risk (04/08/2024)    Patient History     Smoking Tobacco Use: Former     Smokeless Tobacco Use: Never     Passive Exposure: Not on file   Alcohol Use: Not At Risk (04/08/2024)    AUDIT-C     Frequency of Alcohol Consumption: Never     Average Number of Drinks: Patient does not drink     Frequency of Binge Drinking: Never   Financial Resource Strain: Not on file   Food Insecurity: Not on file (04/08/2024)   Transportation Needs: No Transportation Needs (04/08/2024)    PRAPARE - Transportation  Lack of Transportation (Medical): No     Lack of Transportation (Non-Medical): No   Physical Activity: Not on file   Stress: Not on file   Social Connections: Not on file   Intimate Partner Violence: Not on file   Depression: Not on file   Housing Stability: Low Risk  (04/08/2024)    Housing Stability Vital Sign     Unable to Pay for Housing in the Last Year: No     Number of Times Moved in the Last Year: 0     Homeless in the Last Year: No   Interpersonal Safety: Not At Risk (04/08/2024)    Interpersonal Safety Domain Source: IP Abuse Screening     Physical abuse: Denies     Verbal abuse: Denies     Emotional abuse: Denies     Financial abuse: Denies     Sexual abuse: Denies   Utilities: Not At Risk (04/08/2024)    [facility] Utilities     Threatened with loss of utilities: No        Medications were reconciled to the best of my ability given all available resources at the time of admission. Route is PO if not otherwise noted.     Family and social history were personally reviewed, all pertinent and relevant details are outlined as above.    Objective:   BP  (!) 147/86   Pulse (!) 101   Temp 98.6 F (37 C) (Oral)   Resp 22   Ht 1.575 m (5' 2)   Wt 124.3 kg (274 lb)   SpO2 97%   BMI 50.12 kg/m         PHYSICAL EXAM:   General : alert and awake, no acute distress  HEENT: moist mucus membranes  Chest: CTAB, normal WOB  CVS: S1 and S2 heard, no m/g/r  Abd: soft/non tender, non distended  Ext: WWP, no edema  Neuro/Psych: no focal deficits  Skin: no rash      Data Review:   I have independently reviewed and interpreted patient's lab and all other diagnostic data    Abnormal Labs Reviewed   CBC WITH AUTO DIFFERENTIAL - Abnormal; Notable for the following components:       Result Value    Platelets 81 (*)     All other components within normal limits   COMPREHENSIVE METABOLIC PANEL - Abnormal; Notable for the following components:    Glucose 137 (*)     BUN/Creatinine Ratio 28 (*)     Calcium 8.6 (*)     AST 42 (*)     Alk Phosphatase 124 (*)     Albumin 3.3 (*)     All other components within normal limits   BRAIN NATRIURETIC PEPTIDE - Abnormal; Notable for the following components:    NT Pro-BNP 353 (*)     All other components within normal limits       Results       Procedure Component Value Units Date/Time    Respiratory Panel, Molecular, with COVID-19 (Restricted: peds pts or suitable admitted adults) [[id number]] Collected: 04/08/24 1417    Order Status: Sent Specimen: Nasopharyngeal Updated: 04/08/24 1427    Culture, Blood 1 [[id number]] Collected: 04/08/24 0527    Order Status: Completed Specimen: Blood Updated: 04/08/24 1003     Special Requests --        RIGHT  FOREARM       Culture NO GROWTH <24 HRS       Culture, Blood 2 [  [id number]] Collected: 04/08/24 0526    Order Status: Completed Specimen: Blood Updated: 04/08/24 0959     Special Requests --        RIGHT  FOREARM       Culture NO GROWTH <24 HRS       Culture, Respiratory (with Gram Stain) [[id number]]     Order Status: Sent Specimen: Sputum Expectorated               IMAGING:   CTA CHEST W WO  CONTRAST   Final Result         1. No evidence for acute pulmonary embolism. Pulmonary hypertension   2. Right basilar atelectasis versus scarring with small right pleural effusion.   3. Cirrhosis.               Electronically signed by Aletha CHRISTELLA Ham      XR CHEST (2 VW)   Final Result      No acute process or change compared the prior exam.         Electronically signed by DARICE COLON      US  ABDOMEN LIMITED Specify organ? LIVER    (Results Pending)        ECHO: No results found for this or any previous visit.         Notes reviewed from all clinical/nonclinical/nursing services involved in patient's clinical care. Care coordination discussions were held with appropriate clinical/nonclinical/ nursing providers based on care coordination needs.     Assessment and Plan:   63 year old female with history of TRALI, remote history of smoking, anxiety, possible heart failure, A-fib who presented with 4 days of dyspnea.    Acute hypoxic respiratory failure  Suspected COPD exacerbation.  Differential includes heart failure, pneumonia  -Continue azithromycin   - 40 mg of prednisone  for 5 days should be adequate.  No need for higher doses.  - Continue DuoNebs  - Even though patient has low eosinophils on her differential, would benefit from ICS due to her allergic symptoms.  Discharged on LABA/LAMA/ICS  - Patient will need to follow-up with pulmonology for PFTs, COPD management will likely need a sleep study    A-fib  - eliquis   - Will need to discuss if she is on a rate control medication    CAD, 2 prior MI's  Reported by patient, but pt denies HF symptoms apart from abdominal swelling  - Monitor for symptoms of volume overload    Back pain  S/p vertebral fractures from fall  -prn oxy  -methocarbamol     Seasonal allergies  - Flonase    Cirrhosis  - Seen on CT.  Follow-up ultrasound    DIET: ADULT DIET; Regular   ISOLATION PRECAUTIONS: Droplet Plus  CODE STATUS: Full Code   DVT PROPHYLAXIS: Eliquis   FUNCTIONAL STATUS  PRIOR TO HOSPITALIZATION: Fully active and ambulatory; able to carry on all self-care without restriction.  Ambulatory status/function: By self   EARLY MOBILITY ASSESSMENT: Recommend routine ambulation while hospitalized with the assistance of nursing staff  ANTICIPATED DISCHARGE: Greater than 48 hours.  ANTICIPATED DISPOSITION: Home  EMERGENCY CONTACT/SURROGATE DECISION MAKER: Piascik,Jessica (Child)  [phone] (Mobile)    CRITICAL CARE WAS PERFORMED FOR THIS ENCOUNTER: NO.      Signed By: Prentice MALVA Flemings, MD     April 08, 2024         Please note that this dictation may have been completed with [employer], the computer voice recognition software.  Quite often unanticipated grammatical, syntax, homophones, and other  interpretive errors are inadvertently transcribed by the computer software.  Please disregard these errors.  Please excuse any errors that have escaped final proofreading.

## 2024-04-08 NOTE — ED Provider Notes (Addendum)
 [facility] EMERGENCY DEPARTMENT  EMERGENCY DEPARTMENT ENCOUNTER      Patient Name: Breanna Lester  MRN: [id number]  Birthdate 03/04/1961  Date of Evaluation: 04/08/2024  Physician: Lamar Lakes, MD    CHIEF COMPLAINT       Chief Complaint   Patient presents with    Shortness of Breath       HISTORY OF PRESENT ILLNESS   (Location/Symptom, Timing/Onset, Context/Setting, Quality, Duration, Modifying Factors, Severity)   Breanna Lester, 63 y.o., female     63 year old female with a history of TRALI, remote history of smoking, anxiety, heart failure, a fib presents with chief completed four days of worsening shortness of breath. Patient is supposed to be on Eliquis  for history of a fib. She is not currently taking it because she states that ibuprofen is the only thing it helps her chronic low back pain. She has endorsed a productive cough but no fever.          Nursing Notes were reviewed.    REVIEW OF SYSTEMS    (Not required)   Review of Systems    Except as noted above the remainder of the review of systems was reviewed and negative.     PAST MEDICAL HISTORY     Past Medical History:   Diagnosis Date    Anxiety     Depression     Gastrointestinal disorder     Heart failure (HCC)     Liver disease 2009    liver failure    Pneumonia        SURGICAL HISTORY       Past Surgical History:   Procedure Laterality Date    ADENOIDECTOMY  1972    CATARACT REMOVAL      CESAREAN SECTION  1986 and 1993    DILATION AND CURETTAGE OF UTERUS  1999    EGD TRANSORAL BIOPSY SINGLE/MULTIPLE  05/17/2009         HYSTERECTOMY (CERVIX STATUS UNKNOWN)  2008    TONSILLECTOMY  1972       CURRENT MEDICATIONS       Previous Medications    ACETAMINOPHEN  (TYLENOL ) 325 MG TABLET    Take 1 tablet by mouth every 6 hours as needed for Pain    NAPROXEN  (NAPROSYN ) 500 MG TABLET    Take 1 tablet by mouth 2 times daily       ALLERGIES     Latex    FAMILY HISTORY       Family History   Problem Relation Age of Onset    Cancer Mother     Heart Disease Father          SOCIAL HISTORY       Social History     Socioeconomic History    Marital status: Divorced   Tobacco Use    Smoking status: Former     Current packs/day: 0.00     Types: Cigarettes     Quit date: 02/28/2008     Years since quitting: 16.1    Smokeless tobacco: Never   Substance and Sexual Activity    Alcohol use: Never     Social Drivers of Health     Food Insecurity: Food Insecurity Present (09/01/2022)    Received from [facility]    Hunger Vital Sign     Within the past 12 months, you worried that your food would run out before you got the money to buy more.: Sometimes true     Within the past  12 months, the food you bought just didn't last and you didn't have money to get more.: Sometimes true   Transportation Needs: No Transportation Needs (09/01/2022)    Received from [facility] - Transportation     Lack of Transportation (Medical): No     Lack of Transportation (Non-Medical): No   Housing Stability: High Risk (09/01/2022)    Received from [facility] Stability Vital Sign     Unable to Pay for Housing in the Last Year: No     Number of Places Lived in the Last Year: 1     Unstable Housing in the Last Year: Yes       PHYSICAL EXAM     Vitals:    Vitals:    04/08/24 0238 04/08/24 0242 04/08/24 0408   BP: (!) 181/89     Pulse:  88    Resp:  24 (!) 36   Temp:  98.8 F (37.1 C)    TempSrc:  Oral    SpO2: 91% 95% (!) 86%   Weight:  124.4 kg (274 lb 4 oz)    Height:  1.575 m (5' 2)        Physical Exam  Vitals and nursing note reviewed.   Constitutional:       General: She is not in acute distress.     Appearance: Normal appearance. She is obese. She is not ill-appearing, toxic-appearing or diaphoretic.   HENT:      Head: Normocephalic and atraumatic.      Mouth/Throat:      Mouth: Mucous membranes are moist.   Eyes:      Extraocular Movements: Extraocular movements intact.   Cardiovascular:      Rate and Rhythm: Normal rate.   Pulmonary:      Effort: Pulmonary effort is normal.      Breath sounds:  Wheezing present.   Abdominal:      General: There is no distension.   Musculoskeletal:         General: Normal range of motion.      Cervical back: Normal range of motion.   Skin:     General: Skin is dry.   Neurological:      General: No focal deficit present.      Mental Status: She is alert.   Psychiatric:         Mood and Affect: Mood normal.         DIAGNOSTIC RESULTS     RADIOLOGY:   Non-plain film images such as CT, Ultrasound and MRI are read by the radiologist. Plain radiographic images are visualized and preliminarily interpreted by the emergency physician with the below findings:        Interpretation per the Radiologist below, if available at the time of this note:    XR CHEST (2 VW)   Final Result      No acute process or change compared the prior exam.         Electronically signed by DARICE COLON      CTA CHEST W WO CONTRAST    (Results Pending)         ED BEDSIDE ULTRASOUND:   Performed by ED Physician    LABS:  Labs Reviewed   CBC WITH AUTO DIFFERENTIAL - Abnormal; Notable for the following components:       Result Value    Platelets 81 (*)     All other components within normal limits  COMPREHENSIVE METABOLIC PANEL - Abnormal; Notable for the following components:    Glucose 137 (*)     BUN/Creatinine Ratio 28 (*)     Calcium 8.6 (*)     AST 42 (*)     Alk Phosphatase 124 (*)     Albumin 3.3 (*)     All other components within normal limits   BRAIN NATRIURETIC PEPTIDE - Abnormal; Notable for the following components:    NT Pro-BNP 353 (*)     All other components within normal limits   MAGNESIUM   TROPONIN   D-DIMER, RAPID       All other labs were unremarkable or not returned as of this dictation.    EMERGENCY DEPARTMENT COURSE and DIFFERENTIAL DIAGNOSIS/MDM:   Medical Decision Making  63 year old female presents with shortness of breath. She does have some faint wheezing on exam. Differentials include but are not limited to COPD exacerbation, pneumonia, PE, heart failure, ACS among others.  White blood cell count and hemoglobin are normal. Liver and renal function essentially normal. PNP is essentially normal. The diameter is normal. Troponin is normal. Chest x-rays clear. Patient is hypoxic with ambulation to 86%. She was given several breathing treatments and IV Solu-Medrol . Will obtain CT imaging to evaluate for occult pneumonia. Patient admitted to hospital medicine for further management      Perfect Serve Consult for Admission  4:07 AM    ED Room Number: C05/C05  Patient Name and age:  ZAKARA PARKEY 63 y.o.  female  Working Diagnosis: Acute respiratory failure with hypoxia (HCC)  (primary encounter diagnosis)    COVID-19 Suspicion: No  Sepsis present:  No  Reassessment needed: No  Code Status:  Full Code  Readmission: No  Isolation Requirements: no  Recommended Level of Care: telemetry  Department: [facility] - [id number]    Other: 63 year old former smoker presents with four days of shortness of breath. ED work up unremarkable. Patient wheezing and hypoxic with ambulation to 86%. Treating COPD        Amount and/or Complexity of Data Reviewed  Labs: ordered.  Radiology: ordered.  ECG/medicine tests: ordered.    Risk  Prescription drug management.  Decision regarding hospitalization.         EKG: All EKG's are interpreted by the Emergency Department Physician who either signs or Co-signs this chart in the absence of a cardiologist.    ED Course as of 04/08/24 0415   Tue Apr 08, 2024   0251 EKG shows sinus rhythm at a rate of 85, prolonged QTC, otherwise normal intervals, normal axis, no STEMI [RD]      ED Course User Index  [RD] Charma Lamar HERO, MD       CRITICAL CARE TIME   Total Critical Care time was 39 minutes, excluding separately reportable procedures.  There was a high probability of clinically significant/life threatening deterioration in the patient's condition which required my urgent intervention.     CONSULTS:  None    PROCEDURES:  Unless otherwise noted below, none      Procedures    FINAL IMPRESSION      1. Acute respiratory failure with hypoxia (HCC)          DISPOSITION/PLAN   DISPOSITION Decision To Admit 04/08/2024 04:07:22 AM    PATIENT REFERRED TO:  No follow-up provider specified.    DISCHARGE MEDICATIONS:  New Prescriptions    No medications on file     Controlled Substances Monitoring:  No data to display                (Please note that portions of this note were completed with a voice recognition program.  Efforts were made to edit the dictations but occasionally words are mis-transcribed.)    Lamar Lakes, MD (electronically signed)  Attending Emergency Physician            Lakes Lamar HERO, MD  04/08/24 0415       Lakes Lamar HERO, MD  04/08/24 [phone]

## 2024-04-08 NOTE — ED Triage Notes (Signed)
 Pt here from home via EMS for SOB, DOE intermittently x4 days. pt states was in shower, by the time she got out she states she could hardly breath. Pt states she gets chest twinges, w more frequency over the last 4 days. 96% on room air, was placed on 2L NC for comfort with subjective improvement per patient. 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 HTN, supposed to be on Eliquis  but me and my doctor are fighting about that.

## 2024-04-08 NOTE — Care Coordination-Inpatient (Signed)
 04/08/24  1:40 PM           CASE MANAGEMENT LOW RISK ADMISSION NOTE  04/08/2024  1:41 PM      ICD-10-CM    1. Acute respiratory failure with hypoxia (HCC)  J96.01       2. Shortness of breath  R06.02 Echo (TTE) complete (PRN contrast/bubble/strain/3D)     Echo (TTE) complete (PRN contrast/bubble/strain/3D)     CANCELED: Echo (TTE) limited (PRN contrast/bubble/strain/3D)     CANCELED: Echo (TTE) limited (PRN contrast/bubble/strain/3D)          General Risk Score: 3   Values used to calculate this score:    Points  Metrics       0        Age: 63       0        Hospital Admissions: 0       2        ED Visits: 2       0        Has Chronic Obstructive Pulmonary Disease: No       0        Has Diabetes Excluding Gestational Diabetes: No       0        Has Congestive Heart Failure: No       1        Has Liver Disease: Yes       0        Has Depression: No       0        Current PCP: Provider Unknown, NP-C       0        Has Medicaid: No      Admit Date:04/08/2024  2:34 AM    Admission Status: Observation    Providers consulted at this time: None    PT/OT consulted?: No   Discharge recommendations: N/A    CM reviewed EMR. No CM consults received at the /time of this writing however patient requesting assistance with transportation at discharge, patient has autistic daughter who does not drive and reports no other support in the area. Estimated discharge date is 8/21. Providers, if additional needs arise please consult case management.     Breanna Lester

## 2024-04-08 NOTE — ED Notes (Signed)
 Walking pulse ox completed. Pt.with audible wheezing and with increased work of breathing. Pt.had to stop and take break during walking. O2 sat decreased to 86% briefly with RR of 36 immediately after ambulation.  Pt.states she has sharp pain in back that has been happening with ambulation.  MD made aware

## 2024-04-09 LAB — COMPREHENSIVE METABOLIC PANEL
ALT: 20 U/L (ref 10–35)
AST: 37 U/L — ABNORMAL HIGH (ref 10–35)
Albumin/Globulin Ratio: 0.7 — ABNORMAL LOW (ref 1.1–2.2)
Albumin: 2.6 g/dL — ABNORMAL LOW (ref 3.5–5.2)
Alk Phosphatase: 102 U/L (ref 35–104)
Anion Gap: 10 mmol/L (ref 2–14)
BUN/Creatinine Ratio: 20 (ref 12–20)
BUN: 16 mg/dL (ref 8–23)
CO2: 25 mmol/L (ref 20–29)
Calcium: 8.5 mg/dL — ABNORMAL LOW (ref 8.8–10.2)
Chloride: 102 mmol/L (ref 98–107)
Creatinine: 0.8 mg/dL (ref 0.60–1.00)
Est, Glom Filt Rate: 83 ml/min/1.73m2 — ABNORMAL LOW (ref >90)
Globulin: 3.6 g/dL (ref 2.0–4.0)
Glucose: 247 mg/dL — ABNORMAL HIGH (ref 65–100)
Potassium: 3.9 mmol/L (ref 3.5–5.1)
Sodium: 137 mmol/L (ref 136–145)
Total Bilirubin: 0.5 mg/dL (ref 0.0–1.2)
Total Protein: 6.2 g/dL — ABNORMAL LOW (ref 6.4–8.3)

## 2024-04-09 LAB — CULTURE, BLOOD, PCR ID PANEL RESULTS REPORT
Acinetobacter calcoac baumannii complex by PCR: NOT DETECTED
Bacteroides fragilis by PCR: NOT DETECTED
Candida albicans by PCR: NOT DETECTED
Candida auris by PCR: NOT DETECTED
Candida glabrata: NOT DETECTED
Candida krusei by PCR: NOT DETECTED
Candida parapsilosis by PCR: NOT DETECTED
Candida tropicalis by PCR: NOT DETECTED
Cryptococcus neoformans/gattii by PCR: NOT DETECTED
Enterobacter cloacae complex by PCR: NOT DETECTED
Enterobacteriaceae by PCR: NOT DETECTED
Enterococcus faecalis by PCR: NOT DETECTED
Enterococcus faecium by PCR: NOT DETECTED
Escherichia Coli: NOT DETECTED
Haemophilus Influenzae by PCR: NOT DETECTED
Klebsiella aerogenes by PCR: NOT DETECTED
Klebsiella oxytoca by PCR: NOT DETECTED
Klebsiella pneumoniae group by PCR: NOT DETECTED
Listeria monocytogenes by PCR: NOT DETECTED
Neisseria meningitidis by PCR: NOT DETECTED
Proteus by PCR: NOT DETECTED
Pseudomonas aeruginosa: NOT DETECTED
STAPHYLOCOCCUS: NOT DETECTED
STREPTOCOCCUS: DETECTED — AB
Salmonella species by PCR: NOT DETECTED
Serratia marcescens by PCR: NOT DETECTED
Staphylococcus Aureus: NOT DETECTED
Staphylococcus epidermidis by PCR: NOT DETECTED
Staphylococcus lugdunensis by PCR: NOT DETECTED
Stenotrophomonas maltophilia by PCR: NOT DETECTED
Strep pneumoniae: NOT DETECTED
Strep pyogenes,(Grp. A): NOT DETECTED
Streptococcus agalactiae (Group B): NOT DETECTED

## 2024-04-09 LAB — CBC WITH AUTO DIFFERENTIAL
Basophils %: 0.1 % (ref 0.0–1.0)
Basophils Absolute: 0.01 K/UL (ref 0.00–0.10)
Eosinophils %: 0 % (ref 0.0–7.0)
Eosinophils Absolute: 0 K/UL (ref 0.00–0.40)
Hematocrit: 36.1 % (ref 35.0–47.0)
Hemoglobin: 11.3 g/dL — ABNORMAL LOW (ref 11.5–16.0)
Immature Granulocytes %: 0.4 % (ref 0.0–0.5)
Immature Granulocytes Absolute: 0.03 K/UL (ref 0.00–0.04)
Lymphocytes %: 9.5 % — ABNORMAL LOW (ref 12.0–49.0)
Lymphocytes Absolute: 0.66 K/UL — ABNORMAL LOW (ref 0.80–3.50)
MCH: 27.5 pg (ref 26.0–34.0)
MCHC: 31.3 g/dL (ref 30.0–36.5)
MCV: 87.8 FL (ref 80.0–99.0)
MPV: 11.3 FL (ref 8.9–12.9)
Monocytes %: 7.1 % (ref 5.0–13.0)
Monocytes Absolute: 0.49 K/UL (ref 0.00–1.00)
Neutrophils %: 82.9 % — ABNORMAL HIGH (ref 32.0–75.0)
Neutrophils Absolute: 5.71 K/UL (ref 1.80–8.00)
Nucleated RBCs: 0 /100{WBCs}
Platelets: 90 K/uL — ABNORMAL LOW (ref 150–400)
RBC: 4.11 M/uL (ref 3.80–5.20)
RDW: 14.4 % (ref 11.5–14.5)
WBC: 6.9 K/uL (ref 3.6–11.0)
nRBC: 0 K/uL (ref 0.00–0.01)

## 2024-04-09 LAB — POCT GLUCOSE
POC Glucose: 235 mg/dL — ABNORMAL HIGH (ref 65–117)
POC Glucose: 242 mg/dL — ABNORMAL HIGH (ref 65–117)

## 2024-04-09 LAB — EKG 12-LEAD
Atrial Rate: 85 {beats}/min
Diagnosis: NORMAL
P Axis: 68 degrees
P-R Interval: 162 ms
Q-T Interval: 416 ms
QRS Duration: 84 ms
QTc Calculation (Bazett): 495 ms
R Axis: 60 degrees
T Axis: 70 degrees
Ventricular Rate: 85 {beats}/min

## 2024-04-09 LAB — CULTURE, BLOOD 2

## 2024-04-09 LAB — PHOSPHORUS: Phosphorus: 1.7 mg/dL — ABNORMAL LOW (ref 2.5–4.5)

## 2024-04-09 LAB — MAGNESIUM: Magnesium: 2.1 mg/dL (ref 1.6–2.4)

## 2024-04-09 MED ORDER — STERILE WATER FOR INJECTION (MIXTURES ONLY)
2 | INTRAMUSCULAR | Status: DC
Start: 2024-04-09 — End: 2024-04-10
  Administered 2024-04-10: 02:00:00 2000 mg via INTRAVENOUS

## 2024-04-09 MED ORDER — GLUCOSE 4 G PO CHEW
4 | ORAL | Status: DC | PRN
Start: 2024-04-09 — End: 2024-04-11

## 2024-04-09 MED ORDER — STERILE WATER FOR INJECTION (MIXTURES ONLY)
2 | Freq: Two times a day (BID) | INTRAMUSCULAR | Status: DC
Start: 2024-04-09 — End: 2024-04-09
  Administered 2024-04-09: 04:00:00 2000 mg via INTRAVENOUS

## 2024-04-09 MED ORDER — GLUCAGON (RDNA) 1 MG IJ KIT
1 | INTRAMUSCULAR | Status: DC | PRN
Start: 2024-04-09 — End: 2024-04-11

## 2024-04-09 MED ORDER — INSULIN NPH (HUMAN) (ISOPHANE) 100 UNIT/ML SC SUSP
100 | Freq: Every morning | SUBCUTANEOUS | Status: DC
Start: 2024-04-09 — End: 2024-04-11
  Administered 2024-04-10 – 2024-04-11 (×2): 6 [IU] via SUBCUTANEOUS

## 2024-04-09 MED ORDER — METOPROLOL TARTRATE 25 MG PO TABS
25 | Freq: Two times a day (BID) | ORAL | Status: DC
Start: 2024-04-09 — End: 2024-04-11
  Administered 2024-04-09 – 2024-04-11 (×5): 25 mg via ORAL

## 2024-04-09 MED ORDER — INSULIN NPH (HUMAN) (ISOPHANE) 100 UNIT/ML SC SUSP
100 | Freq: Once | SUBCUTANEOUS | Status: DC
Start: 2024-04-09 — End: 2024-04-10

## 2024-04-09 MED ORDER — IPRATROPIUM-ALBUTEROL 0.5-2.5 (3) MG/3ML IN SOLN
0.5-2.5 | Freq: Four times a day (QID) | RESPIRATORY_TRACT | Status: DC
Start: 2024-04-09 — End: 2024-04-11
  Administered 2024-04-09 – 2024-04-11 (×8): 1 via RESPIRATORY_TRACT

## 2024-04-09 MED ORDER — DEXTROSE 10 % IV BOLUS
INTRAVENOUS | Status: DC | PRN
Start: 2024-04-09 — End: 2024-04-11

## 2024-04-09 MED ORDER — INSULIN LISPRO 100 UNIT/ML IJ SOLN
100 | Freq: Four times a day (QID) | INTRAMUSCULAR | Status: DC
Start: 2024-04-09 — End: 2024-04-11
  Administered 2024-04-09 – 2024-04-10 (×3): 2 [IU] via SUBCUTANEOUS
  Administered 2024-04-11: 16:00:00 4 [IU] via SUBCUTANEOUS
  Administered 2024-04-11: 01:00:00 2 [IU] via SUBCUTANEOUS

## 2024-04-09 MED ORDER — DEXTROSE 10 % IV SOLN
10 | INTRAVENOUS | Status: DC | PRN
Start: 2024-04-09 — End: 2024-04-11

## 2024-04-09 MED FILL — CHLORTHALIDONE 25 MG PO TABS: 25 mg | ORAL | Qty: 1

## 2024-04-09 MED FILL — DEXTROSE 10 % IV SOLN: 10 % | INTRAVENOUS | Qty: 500

## 2024-04-09 MED FILL — OXYCODONE HCL 5 MG PO TABS: 5 mg | ORAL | Qty: 1

## 2024-04-09 MED FILL — PREDNISONE 20 MG PO TABS: 20 mg | ORAL | Qty: 2

## 2024-04-09 MED FILL — BUDESONIDE 0.5 MG/2ML IN SUSP: 0.5 MG/2ML | RESPIRATORY_TRACT | Qty: 2

## 2024-04-09 MED FILL — ELIQUIS 5 MG PO TABS: 5 mg | ORAL | Qty: 1

## 2024-04-09 MED FILL — INSULIN LISPRO 100 UNIT/ML IJ SOLN: 100 [IU]/mL | INTRAMUSCULAR | Qty: 2

## 2024-04-09 MED FILL — ESCITALOPRAM OXALATE 10 MG PO TABS: 10 mg | ORAL | Qty: 1

## 2024-04-09 MED FILL — CEFTRIAXONE SODIUM 2 G IJ SOLR: 2 g | INTRAMUSCULAR | Qty: 2000

## 2024-04-09 MED FILL — IPRATROPIUM-ALBUTEROL 0.5-2.5 (3) MG/3ML IN SOLN: 0.5-2.5 (3) MG/3ML | RESPIRATORY_TRACT | Qty: 3

## 2024-04-09 MED FILL — ACETAMINOPHEN 325 MG PO TABS: 325 mg | ORAL | Qty: 2

## 2024-04-09 MED FILL — SERTRALINE HCL 50 MG PO TABS: 50 mg | ORAL | Qty: 2

## 2024-04-09 MED FILL — CHLORTHALIDONE 25 MG PO TABS: 25 mg | ORAL | Qty: 1 | Fill #0

## 2024-04-09 MED FILL — AZITHROMYCIN 500 MG IV SOLR: 500 mg | INTRAVENOUS | Qty: 500

## 2024-04-09 MED FILL — METOPROLOL TARTRATE 25 MG PO TABS: 25 mg | ORAL | Qty: 1

## 2024-04-09 NOTE — Progress Notes (Signed)
 Ceftriaxone Dose Adjustment:  The following dose adjustment was made with respect to this indication bloodstream infection  Final dosing regimen: 2 grams IV q24H  Previous dosing regimen:  2 grams IV q12H    Note: Pharmacist will automatically order ceftriaxone 2000 mg dose for patients with BMI >= 30 kg/m2 for all indications except for gonorrhea.   Site/Type of Infection Dose and Route Frequency   Bacteremia 2000 mg IV Every 24 hours   Critically ill 2000 mg IV Every 24 hours   Endocarditis (Enterococcus synergy) 2000 mg IV Every 12 hours   Endocarditis (non-Enterococcus synergy) 2000 mg IV Every 24 hours   Intra-abdominal infection 2000 mg IV Every 24 hours   Meningitis and CNS infections 2000 mg IV Every 12 hours   Osteomyelitis/septic arthritis 2000 mg IV Every 24 hours   Pneumonia 1000 mg IV Every 24 hours   Prosthetic joint infections 2000 mg IV Every 24 hours   Skin and soft tissue infections 1000 mg IV Every 24 hours   Spontaneous bacterial peritonitis prophylaxis 1000 mg IV Every 24 hours   Spontaneous bacterial peritonitis treatment 2000 mg IV Every 24 hours   Urinary tract infections 1000 mg IV Every 24 hours   Gonorrhea < 150 kg 500 mg IM Once   Gonorrhea >= 150 kg 1000 mg IM Once   Uncomplicated disseminated gonorrhea 1000 mg IV or IM Every 24 hours     DEFINITIONS:  BMI = body mass index; IM = intramuscular; IV = intravenous

## 2024-04-09 NOTE — Care Coordination-Inpatient (Signed)
 Care Management Progress Note    Reason for Admission:   Shortness of breath [R06.02]  Acute respiratory failure with hypoxia (HCC) [J96.01]  Acute hypoxic respiratory failure (HCC) [J96.01]         Patient Admission Status: Observation  RUR: OBS  Hospitalization in the last 30 days (Readmission):  No        Transition of care plan:  Ongoing medical management.  SOB.  COPD Exacerbation.      Discharge Plan:    Anticipate DC to home with no CM needs.    Date OBS letter given: 04/08/2024    Outpatient follow-up:  TBD    Transport at discharge: Needs assistance.      ______________________________________  Creighton Mulling, BSN, RN-CM  [facility]- Care Management  Available via [facility]  04/09/2024  2:21 PM

## 2024-04-09 NOTE — Progress Notes (Signed)
 [facility]                                                                                          Hospitalist Progress Note  Prentice MALVA Flemings, MD  Office Phone: [phone]        Date of Service:  04/09/2024  NAME:  Breanna Lester  DOB:  [birth year]  MRN:  [id number]       Admission HPI:   Breanna Lester is a 63 y.o. female who presents with dyspnea. Reports has been occurring for four days, progressing. Could not walk 15 steps without being more dyspneic. Very dyspneic after showering. Called 911. Felt better after nebulizers but dyspneic. Reports symptoms are worsened by allergies and ran out of her inhaler. Having sharp chest pains occasionally.     More yellow-white sputum. Having a cough. Smoked 20 years ago, about 20 pack years.       Interval history / Subjective:   8/20  Patient feels better today.  Feels that she can breathe easier.  Still dyspneic with exertion     Assessment & Plan:     63 year old female with history of TRALI, remote history of smoking, anxiety, possible heart failure, A-fib who presented with 4 days of dyspnea.     Acute hypoxic respiratory failure  Suspected COPD exacerbation.  Differential includes heart failure, pneumonia  -Continue azithromycin   - 40 mg of prednisone  for 5 days should be adequate.  No need for higher doses.  - Continue DuoNebs  - Even though patient has low eosinophils on her differential, would benefit from ICS due to her allergic symptoms.  Discharge on LABA/LAMA/ICS  - Patient will need to follow-up with pulmonology for PFTs, COPD management will likely need a sleep study     A-fib  - eliquis   - Started metoprolol since no rate or rhythm control medication in her record  - Outpatient cardiology follow-up     CAD, 2 prior MI's  Reported by patient, but pt denies HF symptoms apart from abdominal swelling  - Monitor for symptoms of volume overload     Back pain  S/p vertebral fractures from fall  -prn oxy  -methocarbamol       Seasonal allergies  - Flonase     Cirrhosis  - Seen on CT.  Follow-up ultrasound    Diabetes/hyperglycemia  - Patient has had blood glucoses above 125 for all previous glucose checks within the system  - A1c  - SSI  -Added NPH to go along with prednisone   - Will likely need glargine going home     Code status: Full  DVT Prophylaxis: Apixaban   Anticipated Disposition: Home       Review of Systems:   Pertinent symptoms noted in the HPI    Vital Signs:    Last 24hrs VS reviewed since prior progress note. Most recent are:  Vitals:    04/09/24 0737   BP: (!) 141/90   Pulse: 97   Resp: 19   Temp: 97.9 F (36.6 C)   SpO2: 93%       Physical Examination:  I had a face to face encounter with this patient and independently examined them on 04/09/2024 as outlined below:        General : alert and awake, no acute distress  HEENT: moist mucus membranes  Chest: Distant breath sounds.  No clear wheezing.  Mildly increased work of breathing  CVS: S1 and S2 heard, no m/g/r  Abd: non distended  Ext: WWP, no edema  Neuro/Psych: no focal deficits  Skin: no rash      Labs and imaging:     Recent Labs     04/08/24  0247 04/09/24  0018   WBC 5.0 6.9   HGB 11.5 11.3*   HCT 36.9 36.1   PLT 81* 90*     Recent Labs     04/08/24  0247 04/09/24  0018   NA 141 137   K 3.8 3.9   CL 107 102   CO2 25 25   BUN 18 16   MG 1.9 2.1   PHOS  --  1.7*     Recent Labs     04/08/24  0247 04/09/24  0018   ALT 17 20   GLOB 3.1 3.6     No results for input(s): INR, APTT in the last 72 hours.    Invalid input(s): PTP   No results for input(s): TIBC in the last 72 hours.    Invalid input(s): FE, PSAT, FERR   No results found for: RBCF   No results for input(s): PH, PCO2, PO2 in the last 72 hours.  No results for input(s): CPK in the last 72 hours.    Invalid input(s): CPKMB, CKNDX, TROIQ  No results found for: CHOL, CHLST, CHOLV, HDL, HDLC, LDL, LDLC  No results found for: GLUCPOC  @LABUA @      CTA CHEST W WO  CONTRAST   Final Result         1. No evidence for acute pulmonary embolism. Pulmonary hypertension   2. Right basilar atelectasis versus scarring with small right pleural effusion.   3. Cirrhosis.               Electronically signed by Aletha CHRISTELLA Ham      XR CHEST (2 VW)   Final Result      No acute process or change compared the prior exam.         Electronically signed by DARICE COLON      US  ABDOMEN LIMITED Specify organ? LIVER    (Results Pending)       04/08/24    ECHO (TTE) COMPLETE (PRN CONTRAST/BUBBLE/STRAIN/3D) 04/08/2024  5:08 PM (Final)    Interpretation Summary    Left Ventricle: Normal left ventricular systolic function. EF by 2D Simpsons Biplane is 61%. Left ventricle size is normal. Normal wall thickness. Ventricular mass is normal. Normal wall motion.    Right Ventricle: Not well visualized. Right ventricle size is normal. Normal wall thickness. Normal systolic function. TAPSE is normal.  TAPSE is 2.5 cm.    Image quality is technically difficult. Contrast used: Lumason . Technically difficult study and technically difficult study with poor endocardial visualization.    Signed by: Norleen ONEIDA Balloon, DO on 04/08/2024  5:08 PM        Level of Service Documentation     In caring for this patient today, I reviewed recent notes, interpreted lab results and imaging tests, had a face-to-face encounter with the patient, performing the medically necessary appropriate exam and history, counseled the patient/family/caregiver, ordered medications,  tests and procedures as indicated for diagnostic and therapeutic purposes, consulted and communicated with other healthcare professionals including care coordination and discharge planning, and documented clinical information in the electronic health record.  ______________________________________________________________________                 Prentice MALVA Flemings, MD

## 2024-04-09 NOTE — Plan of Care (Signed)
 Problem: Safety - Adult  Goal: Free from fall injury  Outcome: Progressing     Problem: Chronic Conditions and Co-morbidities  Goal: Patient's chronic conditions and co-morbidity symptoms are monitored and maintained or improved  Outcome: Progressing     Problem: Respiratory - Adult  Goal: Achieves optimal ventilation and oxygenation  04/09/2024 0215 by Adolm Ireland, RN  Outcome: Progressing  04/08/2024 1935 by Audelia Raisin, RCP  Outcome: Progressing

## 2024-04-10 LAB — COMPREHENSIVE METABOLIC PANEL
ALT: 28 U/L (ref 10–35)
AST: 56 U/L — ABNORMAL HIGH (ref 10–35)
Albumin/Globulin Ratio: 0.8 — ABNORMAL LOW (ref 1.1–2.2)
Albumin: 3 g/dL — ABNORMAL LOW (ref 3.5–5.2)
Alk Phosphatase: 101 U/L (ref 35–104)
Anion Gap: 11 mmol/L (ref 2–14)
BUN/Creatinine Ratio: 22 — ABNORMAL HIGH (ref 12–20)
BUN: 19 mg/dL (ref 8–23)
CO2: 27 mmol/L (ref 20–29)
Calcium: 9 mg/dL (ref 8.8–10.2)
Chloride: 104 mmol/L (ref 98–107)
Creatinine: 0.9 mg/dL (ref 0.60–1.00)
Est, Glom Filt Rate: 72 ml/min/1.73m2 — ABNORMAL LOW (ref >90)
Globulin: 3.8 g/dL (ref 2.0–4.0)
Glucose: 108 mg/dL — ABNORMAL HIGH (ref 65–100)
Potassium: 3.6 mmol/L (ref 3.5–5.1)
Sodium: 141 mmol/L (ref 136–145)
Total Bilirubin: 0.4 mg/dL (ref 0.0–1.2)
Total Protein: 6.8 g/dL (ref 6.4–8.3)

## 2024-04-10 LAB — CBC WITH AUTO DIFFERENTIAL
Basophils %: 0.5 % (ref 0.0–1.0)
Basophils Absolute: 0.06 K/UL (ref 0.00–0.10)
Eosinophils %: 0.6 % (ref 0.0–7.0)
Eosinophils Absolute: 0.07 K/UL (ref 0.00–0.40)
Hematocrit: 40.7 % (ref 35.0–47.0)
Hemoglobin: 12.7 g/dL (ref 11.5–16.0)
Immature Granulocytes %: 0.5 % (ref 0.0–0.5)
Immature Granulocytes Absolute: 0.06 K/UL — ABNORMAL HIGH (ref 0.00–0.04)
Lymphocytes %: 21.7 % (ref 12.0–49.0)
Lymphocytes Absolute: 2.47 K/UL (ref 0.80–3.50)
MCH: 27.3 pg (ref 26.0–34.0)
MCHC: 31.2 g/dL (ref 30.0–36.5)
MCV: 87.5 FL (ref 80.0–99.0)
MPV: 11.3 FL (ref 8.9–12.9)
Monocytes %: 8 % (ref 5.0–13.0)
Monocytes Absolute: 0.91 K/UL (ref 0.00–1.00)
Neutrophils %: 68.7 % (ref 32.0–75.0)
Neutrophils Absolute: 7.8 K/UL (ref 1.80–8.00)
Nucleated RBCs: 0 /100{WBCs}
Platelets: 126 K/uL — ABNORMAL LOW (ref 150–400)
RBC: 4.65 M/uL (ref 3.80–5.20)
RDW: 14.8 % — ABNORMAL HIGH (ref 11.5–14.5)
WBC: 11.4 K/uL — ABNORMAL HIGH (ref 3.6–11.0)
nRBC: 0 K/uL (ref 0.00–0.01)

## 2024-04-10 LAB — POCT GLUCOSE
POC Glucose: 103 mg/dL (ref 65–117)
POC Glucose: 112 mg/dL (ref 65–117)
POC Glucose: 174 mg/dL — ABNORMAL HIGH (ref 65–117)
POC Glucose: 248 mg/dL — ABNORMAL HIGH (ref 65–117)

## 2024-04-10 LAB — HEMOGLOBIN A1C
Estimated Avg Glucose: 184 mg/dL
Hemoglobin A1C: 8.1 % — ABNORMAL HIGH (ref 4.0–5.6)

## 2024-04-10 LAB — MAGNESIUM: Magnesium: 2.2 mg/dL (ref 1.6–2.4)

## 2024-04-10 LAB — CULTURE, BLOOD 1: Culture: NO GROWTH

## 2024-04-10 LAB — PHOSPHORUS: Phosphorus: 3 mg/dL (ref 2.5–4.5)

## 2024-04-10 MED FILL — CHLORTHALIDONE 25 MG PO TABS: 25 mg | ORAL | Qty: 1

## 2024-04-10 MED FILL — AZITHROMYCIN 500 MG IV SOLR: 500 mg | INTRAVENOUS | Qty: 500

## 2024-04-10 MED FILL — ACETAMINOPHEN 325 MG PO TABS: 325 mg | ORAL | Qty: 2

## 2024-04-10 MED FILL — SERTRALINE HCL 50 MG PO TABS: 50 mg | ORAL | Qty: 2

## 2024-04-10 MED FILL — IPRATROPIUM-ALBUTEROL 0.5-2.5 (3) MG/3ML IN SOLN: 0.5-2.5 (3) MG/3ML | RESPIRATORY_TRACT | Qty: 3

## 2024-04-10 MED FILL — ELIQUIS 5 MG PO TABS: 5 mg | ORAL | Qty: 1

## 2024-04-10 MED FILL — OXYCODONE HCL 5 MG PO TABS: 5 mg | ORAL | Qty: 1

## 2024-04-10 MED FILL — BUDESONIDE 0.5 MG/2ML IN SUSP: 0.5 MG/2ML | RESPIRATORY_TRACT | Qty: 2

## 2024-04-10 MED FILL — CEFTRIAXONE SODIUM 2 G IJ SOLR: 2 g | INTRAMUSCULAR | Qty: 2000

## 2024-04-10 MED FILL — HUMULIN N 100 UNIT/ML SC SUSP: 100 [IU]/mL | SUBCUTANEOUS | Qty: 6

## 2024-04-10 MED FILL — PREDNISONE 20 MG PO TABS: 20 mg | ORAL | Qty: 2

## 2024-04-10 MED FILL — METOPROLOL TARTRATE 25 MG PO TABS: 25 mg | ORAL | Qty: 1

## 2024-04-10 MED FILL — ESCITALOPRAM OXALATE 10 MG PO TABS: 10 mg | ORAL | Qty: 1

## 2024-04-10 MED FILL — INSULIN LISPRO 100 UNIT/ML IJ SOLN: 100 [IU]/mL | INTRAMUSCULAR | Qty: 2

## 2024-04-10 NOTE — Progress Notes (Signed)
 [facility]                                                                                          Hospitalist Progress Note  Prentice MALVA Flemings, MD  Office Phone: [phone]        Date of Service:  04/10/2024  NAME:  Breanna Lester  DOB:  08-Oct-1960  MRN:  [id number]       Admission HPI:   Breanna Lester is a 63 y.o. female who presents with dyspnea. Reports has been occurring for four days, progressing. Could not walk 15 steps without being more dyspneic. Very dyspneic after showering. Called 911. Felt better after nebulizers but dyspneic. Reports symptoms are worsened by allergies and ran out of her inhaler. Having sharp chest pains occasionally.     More yellow-white sputum. Having a cough. Smoked 20 years ago, about 20 pack years.       Interval history / Subjective:   8/20  Patient feels better today.  Feels that she can breathe easier.  Still dyspneic with exertion    8/21  Breathing better. Walked without desatting but desatted overnight with walking.     Assessment & Plan:     63 year old female with history of TRALI, remote history of smoking, anxiety, possible heart failure, A-fib who presented with 4 days of dyspnea.     Acute hypoxic respiratory failure  Suspected COPD exacerbation.  Differential includes heart failure, pneumonia  -Continue azithromycin   - 40 mg of prednisone  for 5 days should be adequate.  No need for higher doses.  - Continue DuoNebs  - Even though patient has low eosinophils on her differential, would benefit from ICS due to her allergic symptoms.  Discharge on LABA/LAMA/ICS  - Patient will need to follow-up with pulmonology for PFTs, COPD management will likely need a sleep study     A-fib  - eliquis   - Started metoprolol since no rate or rhythm control medication in her record  - Outpatient cardiology follow-up     CAD, 2 prior MI's  Reported by patient, but pt denies HF symptoms apart from abdominal swelling  - Monitor for symptoms of volume  overload     Back pain  S/p vertebral fractures from fall  -prn oxy  -methocarbamol      Seasonal allergies  - Flonase     Cirrhosis  - Seen on CT.  Follow-up ultrasound    Diabetes/hyperglycemia  - Patient has had blood glucoses above 125 for all previous glucose checks within the system  - A1c  - SSI  -Added NPH to go along with prednisone   - Will likely need glargine going home     Code status: Full  DVT Prophylaxis: Apixaban   Anticipated Disposition: Home       Review of Systems:   Pertinent symptoms noted in the HPI    Vital Signs:    Last 24hrs VS reviewed since prior progress note. Most recent are:  Vitals:    04/10/24 0816   BP:    Pulse: 70   Resp: 16   Temp:  SpO2: 96%       Physical Examination:     I had a face to face encounter with this patient and independently examined them on 04/10/2024 as outlined below:        General : alert and awake, no acute distress  HEENT: moist mucus membranes  Chest: Distant breath sounds.  No clear wheezing.  Mildly increased work of breathing  CVS: S1 and S2 heard, no m/g/r  Abd: non distended  Ext: WWP, no edema  Neuro/Psych: no focal deficits  Skin: no rash      Labs and imaging:     Recent Labs     04/09/24  0018 04/10/24  0550   WBC 6.9 11.4*   HGB 11.3* 12.7   HCT 36.1 40.7   PLT 90* 126*     Recent Labs     04/08/24  0247 04/09/24  0018 04/10/24  0550   NA 141 137 141   K 3.8 3.9 3.6   CL 107 102 104   CO2 25 25 27    BUN 18 16 19    MG 1.9 2.1 2.2   PHOS  --  1.7* 3.0     Recent Labs     04/08/24  0247 04/09/24  0018 04/10/24  0550   ALT 17 20 28    GLOB 3.1 3.6 3.8     No results for input(s): INR, APTT in the last 72 hours.    Invalid input(s): PTP   No results for input(s): TIBC in the last 72 hours.    Invalid input(s): FE, PSAT, FERR   No results found for: RBCF   No results for input(s): PH, PCO2, PO2 in the last 72 hours.  No results for input(s): CPK in the last 72 hours.    Invalid input(s): CPKMB, CKNDX, TROIQ  No results found  for: CHOL, CHLST, CHOLV, HDL, HDLC, LDL, LDLC  No results found for: GLUCPOC  @LABUA @      US  ABDOMEN LIMITED Specify organ? LIVER   Final Result   1. Morphologic changes of cirrhosis. No LI-RADS observations.   2. Cholelithiasis.         Electronically signed by Baby Stank      CTA CHEST W WO CONTRAST   Final Result         1. No evidence for acute pulmonary embolism. Pulmonary hypertension   2. Right basilar atelectasis versus scarring with small right pleural effusion.   3. Cirrhosis.               Electronically signed by Aletha CHRISTELLA Ham      XR CHEST (2 VW)   Final Result      No acute process or change compared the prior exam.         Electronically signed by DARICE COLON          04/08/24    ECHO (TTE) COMPLETE (PRN CONTRAST/BUBBLE/STRAIN/3D) 04/08/2024  5:08 PM (Final)    Interpretation Summary    Left Ventricle: Normal left ventricular systolic function. EF by 2D Simpsons Biplane is 61%. Left ventricle size is normal. Normal wall thickness. Ventricular mass is normal. Normal wall motion.    Right Ventricle: Not well visualized. Right ventricle size is normal. Normal wall thickness. Normal systolic function. TAPSE is normal.  TAPSE is 2.5 cm.    Image quality is technically difficult. Contrast used: Lumason . Technically difficult study and technically difficult study with poor endocardial visualization.    Signed by: Norleen DASEN  Quin, DO on 04/08/2024  5:08 PM        Level of Service Documentation     In caring for this patient today, I reviewed recent notes, interpreted lab results and imaging tests, had a face-to-face encounter with the patient, performing the medically necessary appropriate exam and history, counseled the patient/family/caregiver, ordered medications, tests and procedures as indicated for diagnostic and therapeutic purposes, consulted and communicated with other healthcare professionals including care coordination and discharge planning, and documented clinical information  in the electronic health record.  ______________________________________________________________________                 Prentice MALVA Flemings, MD

## 2024-04-10 NOTE — Care Coordination-Inpatient (Signed)
 Care Management Progress Note    Reason for Admission:   Shortness of breath [R06.02]  Acute respiratory failure with hypoxia (HCC) [J96.01]  Acute hypoxic respiratory failure (HCC) [J96.01]  COPD exacerbation (HCC) [J44.1]         Patient Admission Status: Inpatient  RUR: 12%  Hospitalization in the last 30 days (Readmission):  No        Transition of care plan:  Ongoing medical management.  Currently on RA.     Discharge Plan:    Anticipate no other CM needs at this time.  Will need transportation assistance.     Discharge plan communicated with patient and/or discharge caregiver: Yes      Date last IMM letter given: 04/08/2024    Outpatient follow-up:  TBD    Transport at discharge: Roundtrip    ______________________________________  Creighton Mulling, BSN, RN-CM  [facility]- Care Management  Available via [facility]  04/10/2024  4:17 PM

## 2024-04-10 NOTE — Plan of Care (Signed)
 Problem: Safety - Adult  Goal: Free from fall injury  Outcome: Progressing     Problem: Chronic Conditions and Co-morbidities  Goal: Patient's chronic conditions and co-morbidity symptoms are monitored and maintained or improved  Outcome: Progressing     Problem: Discharge Planning  Goal: Discharge to home or other facility with appropriate resources  Outcome: Progressing     Problem: Respiratory - Adult  Goal: Achieves optimal ventilation and oxygenation  04/10/2024 0105 by Adolm Ireland, RN  Outcome: Progressing  04/09/2024 1927 by Audelia Raisin, RCP  Outcome: Progressing

## 2024-04-11 LAB — POCT GLUCOSE
POC Glucose: 112 mg/dL (ref 65–117)
POC Glucose: 196 mg/dL — ABNORMAL HIGH (ref 65–117)
POC Glucose: 274 mg/dL — ABNORMAL HIGH (ref 65–117)

## 2024-04-11 MED ORDER — BLOOD GLUCOSE TEST VI STRP
ORAL_STRIP | 2 refills | 29.00000 days | Status: AC
Start: 2024-04-11 — End: ?

## 2024-04-11 MED ORDER — LANCETS 30G MISC
2 refills | Status: AC
Start: 2024-04-11 — End: ?

## 2024-04-11 MED ORDER — ALBUTEROL SULFATE HFA 108 (90 BASE) MCG/ACT IN AERS
108 | Freq: Four times a day (QID) | RESPIRATORY_TRACT | 0 refills | 25.00000 days | Status: AC | PRN
Start: 2024-04-11 — End: 2024-08-03

## 2024-04-11 MED ORDER — METOPROLOL TARTRATE 25 MG PO TABS
25 | ORAL_TABLET | Freq: Two times a day (BID) | ORAL | 1 refills | 90.00000 days | Status: DC
Start: 2024-04-11 — End: 2024-06-12

## 2024-04-11 MED ORDER — BLOOD GLUCOSE MONITOR SYSTEM W/DEVICE KIT
PACK | 0 refills | 29.00000 days | Status: AC
Start: 2024-04-11 — End: ?

## 2024-04-11 MED ORDER — PREDNISONE 20 MG PO TABS
20 | ORAL_TABLET | Freq: Every day | ORAL | 0 refills | 5.50000 days | Status: DC
Start: 2024-04-11 — End: 2024-04-17

## 2024-04-11 MED FILL — METOPROLOL TARTRATE 25 MG PO TABS: 25 mg | ORAL | Qty: 1

## 2024-04-11 MED FILL — INSULIN LISPRO 100 UNIT/ML IJ SOLN: 100 [IU]/mL | INTRAMUSCULAR | Qty: 4

## 2024-04-11 MED FILL — INSULIN LISPRO 100 UNIT/ML IJ SOLN: 100 [IU]/mL | INTRAMUSCULAR | Qty: 2

## 2024-04-11 MED FILL — ACETAMINOPHEN 325 MG PO TABS: 325 mg | ORAL | Qty: 2

## 2024-04-11 MED FILL — OXYCODONE HCL 5 MG PO TABS: 5 mg | ORAL | Qty: 1

## 2024-04-11 MED FILL — SERTRALINE HCL 50 MG PO TABS: 50 mg | ORAL | Qty: 2

## 2024-04-11 MED FILL — HUMULIN N 100 UNIT/ML SC SUSP: 100 [IU]/mL | SUBCUTANEOUS | Qty: 6

## 2024-04-11 MED FILL — ELIQUIS 5 MG PO TABS: 5 mg | ORAL | Qty: 1

## 2024-04-11 MED FILL — PREDNISONE 20 MG PO TABS: 20 mg | ORAL | Qty: 2

## 2024-04-11 MED FILL — CHLORTHALIDONE 25 MG PO TABS: 25 mg | ORAL | Qty: 1

## 2024-04-11 NOTE — Plan of Care (Signed)
 Problem: Safety - Adult  Goal: Free from fall injury  04/11/2024 1147 by Lyle Sa, RN  Outcome: Completed  04/11/2024 1146 by Lyle Sa, RN  Outcome: Progressing  04/11/2024 0005 by Adolm Ireland, RN  Outcome: Progressing     Problem: Chronic Conditions and Co-morbidities  Goal: Patient's chronic conditions and co-morbidity symptoms are monitored and maintained or improved  04/11/2024 1147 by Lyle Sa, RN  Outcome: Completed  04/11/2024 1146 by Lyle Sa, RN  Outcome: Progressing  04/11/2024 0005 by Adolm Ireland, RN  Outcome: Progressing     Problem: Discharge Planning  Goal: Discharge to home or other facility with appropriate resources  04/11/2024 1147 by Lyle Sa, RN  Outcome: Completed  04/11/2024 1146 by Lyle Sa, RN  Outcome: Progressing  04/11/2024 0005 by Adolm Ireland, RN  Outcome: Progressing     Problem: Pain  Goal: Verbalizes/displays adequate comfort level or baseline comfort level  04/11/2024 1147 by Lyle Sa, RN  Outcome: Completed  04/11/2024 1146 by Lyle Sa, RN  Outcome: Progressing     Problem: Respiratory - Adult  Goal: Achieves optimal ventilation and oxygenation  04/11/2024 1147 by Lyle Sa, RN  Outcome: Completed  04/11/2024 1146 by Lyle Sa, RN  Outcome: Progressing  04/11/2024 0711 by Lorene Perkins, RCP  Outcome: Progressing

## 2024-04-11 NOTE — Care Coordination-Inpatient (Signed)
 Case Management Discharge Note:    Discharge Plan:  Patient discharging to home today with the following services:    (1)  DME:  Home O2 at [facility].  [employer]--Accepted.  Portable tank has been delivered at bedside.  They are going to deliver the concentrator to patient's residence today.    [facility] [address] in [place]  [address] [place], [place], [place] [address]   Room:  114    (2)  Transportation:  Round Trip via [employer] or [address].  Patient stated that she needs transportation assistance back to her [employer], as her dtr does not drive.  She also does not have the funds to pay for an Uber/Lyft.        Patient stated no other needs.     ______________________________________  Creighton Mulling, BSN, RN-CM  [facility]- Care Management  Available via [employer]  04/11/2024  2:05 PM

## 2024-04-11 NOTE — Plan of Care (Signed)
 Problem: Pain  Goal: Verbalizes/displays adequate comfort level or baseline comfort level  Outcome: Progressing     Problem: Respiratory - Adult  Goal: Achieves optimal ventilation and oxygenation  04/11/2024 1146 by Lyle Sa, RN  Outcome: Progressing  04/11/2024 0711 by Lorene Perkins, RCP  Outcome: Progressing

## 2024-04-11 NOTE — Discharge Summary (Signed)
 Hospitalist Discharge Summary     Patient ID:  Breanna Lester  [id number]  63 y.o.  [birth year]    Admit date: 04/08/2024    Discharge date and time: 04/11/2024    Admission Diagnoses: Shortness of breath [R06.02]  Acute respiratory failure with hypoxia (HCC) [J96.01]  Acute hypoxic respiratory failure (HCC) [J96.01]  COPD exacerbation (HCC) [J44.1]    Discharge Diagnoses:    Principal Problem:    Acute hypoxic respiratory failure (HCC)  Active Problems:    COPD exacerbation (HCC)  Resolved Problems:    * No resolved hospital problems. *         Hospital Course:       63 year old female with history of TRALI, remote history of smoking, anxiety, possible heart failure, A-fib who presented with 4 days of dyspnea.  She was admitted for further evaluation and management.    Acute hypoxic respiratory failure: Possibly due to underlying COPD.  She was hypoxic in the ED with ambulation to 86%.  She is clinically improved.  RT evaluated for home oxygen and she needs 2 L/min.  Case management to set up home health.    Suspected COPD with exacerbation: She has previous smoking history.  Has not seen pulmonology yet.  She uses albuterol  inhaler as needed.  She received azithromycin  and steroid.  Clinically doing better.  We will discharge her on short course of prednisone .  Pulmonology is consulted but she did not not want to wait and will follow-up as an outpatient.  She will need PFTs which can be done as an outpatient.    A-fib: She was on Eliquis  but was not on any rate control medications.  On admission her heart rate was slightly elevated.  Started on low-dose metoprolol .  Follow-up with cardiology as an outpatient.    Diabetes mellitus type 2: A1c came 8.1%.  Seen by diabetes management.  Given her liver cirrhosis, medications for type 2 diabetes are limited.  Her blood glucose has been stable during hospital stay.  Slight bump noted which is likely due to steroids.  Advised diet changes and avoiding of sweets.   Prescription for glucometer, strips provided.  Patient to check blood glucose 4 times a day.  She will follow-up with PCP for further management.           Imaging  US  ABDOMEN LIMITED Specify organ? LIVER  Result Date: 04/09/2024  1. Morphologic changes of cirrhosis. No LI-RADS observations. 2. Cholelithiasis. Electronically signed by Baby Stank    CTA CHEST W WO CONTRAST  Result Date: 04/08/2024  1. No evidence for acute pulmonary embolism. Pulmonary hypertension 2. Right basilar atelectasis versus scarring with small right pleural effusion. 3. Cirrhosis. Electronically signed by Aletha CHRISTELLA Ham    XR CHEST (2 VW)  Result Date: 04/08/2024  No acute process or change compared the prior exam. Electronically signed by DARICE COLON       PCP: Unknown, Provider, NP-C     Consults: Endocrinology    Condition of patient at discharge: Improved and Stable    Discharge Exam:    Physical Exam:    Gen: Well-developed, well-nourished, in no acute distress  HEENT:  Pink conjunctivae, PERRL, hearing intact to voice, moist mucous membranes  Neck: Supple, without masses, thyroid non-tender  Resp: No accessory muscle use, clear breath sounds without wheezes rales or rhonchi  Card: No murmurs, normal S1, S2 without thrills, bruits or peripheral edema  Abd:  Soft, non-tender, non-distended, normoactive bowel sounds are present, no  palpable organomegaly and no detectable hernias  Lymph:  No cervical or inguinal adenopathy  Musc: No cyanosis or clubbing  Skin: No rashes or ulcers, skin turgor is good  Neuro:  Cranial nerves are grossly intact, no focal motor weakness, follows commands appropriately  Psych:  Good insight, oriented to person, place and time, alert          Disposition: home    Patient Instructions:      Medication List        START taking these medications      albuterol  sulfate HFA 108 (90 Base) MCG/ACT inhaler  Commonly known as: Ventolin  HFA  Inhale 2 puffs into the lungs 4 times daily as needed for Wheezing      metoprolol  tartrate 25 MG tablet  Commonly known as: LOPRESSOR   Take 0.5 tablets by mouth 2 times daily     predniSONE  20 MG tablet  Commonly known as: DELTASONE   Take 2 tablets by mouth daily for 3 doses  Start taking on: April 12, 2024            CONTINUE taking these medications      acetaminophen  325 MG tablet  Commonly known as: TYLENOL   Take 1 tablet by mouth every 6 hours as needed for Pain     apixaban  5 MG Tabs tablet  Commonly known as: ELIQUIS      chlorthalidone  25 MG tablet  Commonly known as: HYGROTON      escitalopram  5 MG tablet  Commonly known as: LEXAPRO      * sertraline  100 MG tablet  Commonly known as: ZOLOFT      * sertraline  50 MG tablet  Commonly known as: ZOLOFT            * This list has 2 medication(s) that are the same as other medications prescribed for you. Read the directions carefully, and ask your doctor or other care provider to review them with you.                STOP taking these medications      naproxen  500 MG tablet  Commonly known as: Naprosyn                Where to Get Your Medications        These medications were sent to [facility] [id number] [place], [place] - [address] [address] [place] [phone] - F [phone]  [address] [place] [address]      Phone: [phone]   albuterol  sulfate HFA 108 (90 Base) MCG/ACT inhaler  metoprolol  tartrate 25 MG tablet  predniSONE  20 MG tablet       Current Discharge Medication List        CONTINUE these medications which have NOT CHANGED    Details   chlorthalidone  (HYGROTON ) 25 MG tablet Take 1 tablet by mouth daily      !! sertraline  (ZOLOFT ) 100 MG tablet Take 1 tablet by mouth daily      apixaban  (ELIQUIS ) 5 MG TABS tablet Take 1 tablet by mouth 2 times daily      escitalopram  (LEXAPRO ) 5 MG tablet Take 1 tablet by mouth daily      !! sertraline  (ZOLOFT ) 50 MG tablet Take 1 tablet by mouth nightly      acetaminophen  (TYLENOL ) 325 MG tablet Take 1 tablet by mouth every 6 hours as needed for Pain  Qty: 40 tablet,  Refills: 0       !! - Potential duplicate medications found. Please  discuss with provider.           Activity: activity as tolerated  Diet: diabetic diet  Wound Care: none needed    Follow-up with Unknown, Provider, NP-C in 2 weeks.  Follow-up tests/labs : None    Approximate time spent in patient care on day of discharge: 35 mins    Signed:  Fernand Mao, MD  04/11/2024  10:20 AM

## 2024-04-11 NOTE — Progress Notes (Signed)
 04/11/24 1028   Resting (Room Air)   SpO2 96   HR 56   During Walk (Room Air)   SpO2 85   HR 116   Rate of Dyspnea 1   Symptoms Shortness of breath   During Walk (On O2)   SpO2 93   O2 Device Nasal cannula   O2 Flow Rate (l/min) 2 l/min   Rate of Dyspnea 0   After Walk   SpO2 93   HR 68   Rate of Dyspnea 0   Does the Patient Qualify for Home O2 Yes   Liter Flow at Rest 0   Liter Flow on Exertion 2   Does the Patient Need Portable Oxygen Tanks Yes

## 2024-04-11 NOTE — Progress Notes (Signed)
 Pt stable for discharge. AVS reviewed with pt. Pt verbalized understanding of instructions including start, stop, and continue medications. When to call 911. Signs and symptoms of stroke and heart attack.  Pt advised to follow-up with pulmonology outpatient. Dr. Criselda contact information given to pt. Pt transported home by [facility].

## 2024-04-11 NOTE — Plan of Care (Signed)
 Problem: Safety - Adult  Goal: Free from fall injury  Outcome: Progressing     Problem: Chronic Conditions and Co-morbidities  Goal: Patient's chronic conditions and co-morbidity symptoms are monitored and maintained or improved  Outcome: Progressing     Problem: Discharge Planning  Goal: Discharge to home or other facility with appropriate resources  Outcome: Progressing     Problem: Respiratory - Adult  Goal: Achieves optimal ventilation and oxygenation  04/10/2024 1915 by Audelia Raisin, RCP  Outcome: Progressing  04/10/2024 1041 by Scotty Arland HERO, RT  Outcome: Progressing

## 2024-04-11 NOTE — Consults (Signed)
 Consult received on Breanna Lester , who is a 63 y.o. female with PMHx of COPD, Afib, CAD with 2 prior Mis, possible HF, liver cirrhosis, who was admitted with dyspnea. Labs on admission show BG 137, A1c 8.1%. CT chest showing right basilar atelectasis versus scarring with small right pleural effusion and cirrhosis. DM asked to see patient due to elevated A1c.    Patient has never been formally diagnosed with diabetes before. Does have a strong family history. No reports of polydipsia. Fatigue and polyuria could be due to other health conditions and medications. Reports she had a long hospitalization in 2008 where she was septic and this caused her liver to fail. She is currently living in a hotel with her adult autistic daughter, so food choices are somewhat limited. She does have a hot plate and cooks eggs, bacon, and toast for breakfast; might have some soup for lunch with some watermelon; and dinner might be a baked potato (microwave) with broccoli/cheese and/or chicken or tuna salad. She does have an occasional dessert and does enjoy eating pastries. Also advised no more regular soda. Discussed her A1c indicating a T2DM diagnosis and management going forward. Advised diet control and avoidance of said sweets with suggestions of other options that may satisfy her sweet tooth. Given her liver cirrhosis, medications for T2DM for her are limited. Looking at her BG trends while here in hospital, would be hesitant to start insulin . She is only having spikes after steroid.     PLAN FOR DISCHARGE:  Diet control and avoidance of sweets  [facility] outpatient diabetes classes  Prescription for glucometer kit (Meter, Lancets (100), Strips (100)).  Patient to obtain a blood glucose reading four times daily.  First thing in the morning prior to eating and drinking anything then before lunch, dinner and bedtime.  Create a log and present to PCP for interpretation.     Follow up closely with PCP        Olivia Lefort MSN,  APRN-CNS, BC-ADM  [facility]   40 min

## 2024-04-14 ENCOUNTER — Emergency Department: Admit: 2024-04-14 | Payer: Medicare (Managed Care) | Primary: Diagnostic Radiology

## 2024-04-14 ENCOUNTER — Inpatient Hospital Stay
Admit: 2024-04-14 | Discharge: 2024-04-17 | Disposition: A | Discharge status: Home / Self Care | Payer: Medicare (Managed Care) | Source: Home / Self Care | Attending: Student in an Organized Health Care Education/Training Program | Admitting: Student in an Organized Health Care Education/Training Program

## 2024-04-14 DIAGNOSIS — K859 Acute pancreatitis without necrosis or infection, unspecified: Secondary | ICD-10-CM

## 2024-04-14 DIAGNOSIS — A419 Sepsis, unspecified organism: Principal | ICD-10-CM

## 2024-04-14 LAB — COMPREHENSIVE METABOLIC PANEL
ALT: 27 U/L (ref 10–35)
AST: 54 U/L — ABNORMAL HIGH (ref 10–35)
Albumin/Globulin Ratio: 1.1 (ref 1.1–2.2)
Albumin: 3.5 g/dL (ref 3.5–5.2)
Alk Phosphatase: 123 U/L — ABNORMAL HIGH (ref 35–104)
Anion Gap: 11 mmol/L (ref 2–12)
BUN/Creatinine Ratio: 23 — ABNORMAL HIGH (ref 12–20)
BUN: 16 mg/dL (ref 8–23)
CO2: 31 mmol/L — ABNORMAL HIGH (ref 22–29)
Calcium: 9.3 mg/dL (ref 8.8–10.2)
Chloride: 98 mmol/L (ref 98–107)
Creatinine: 0.71 mg/dL (ref 0.50–0.90)
Est, Glom Filt Rate: >90 ml/min/1.73m2 (ref >60)
Globulin: 3.3 g/dL (ref 2.0–4.0)
Glucose: 139 mg/dL — ABNORMAL HIGH (ref 65–100)
Potassium: 3.6 mmol/L (ref 3.5–5.1)
Sodium: 140 mmol/L (ref 136–145)
Total Bilirubin: 0.9 mg/dL (ref 0.0–1.2)
Total Protein: 6.8 g/dL (ref 6.4–8.3)

## 2024-04-14 LAB — URINALYSIS WITH MICROSCOPIC
BACTERIA, URINE: NEGATIVE /HPF
Bilirubin, Urine: NEGATIVE
Blood, Urine: NEGATIVE
Glucose, Ur: NEGATIVE mg/dL
Leukocyte Esterase, Urine: NEGATIVE
Nitrite, Urine: NEGATIVE
Protein, UA: NEGATIVE mg/dL
Specific Gravity, UA: 1.015 (ref 1.003–1.030)
Urobilinogen, Urine: 0.2 EU/dL (ref 0.2–1.0)
pH, Urine: 6.5 (ref 5.0–8.0)

## 2024-04-14 LAB — EKG 12-LEAD
Atrial Rate: 82 {beats}/min
P Axis: 85 degrees
P-R Interval: 150 ms
Q-T Interval: 420 ms
QRS Duration: 82 ms
QTc Calculation (Bazett): 490 ms
R Axis: 73 degrees
T Axis: 71 degrees
Ventricular Rate: 82 {beats}/min

## 2024-04-14 LAB — CBC WITH AUTO DIFFERENTIAL
Basophils %: 0.2 % (ref 0.0–1.0)
Basophils Absolute: 0.02 K/UL (ref 0.00–0.10)
Eosinophils %: 1.1 % (ref 0.0–7.0)
Eosinophils Absolute: 0.1 K/UL (ref 0.00–0.40)
Hematocrit: 41.4 % (ref 35.0–47.0)
Hemoglobin: 13.1 g/dL (ref 11.5–16.0)
Immature Granulocytes %: 0.1 % (ref 0.0–0.5)
Immature Granulocytes Absolute: 0.01 K/UL (ref 0.00–0.04)
Lymphocytes %: 14.3 % (ref 12.0–49.0)
Lymphocytes Absolute: 1.28 K/UL (ref 0.80–3.50)
MCH: 27.6 pg (ref 26.0–34.0)
MCHC: 31.6 g/dL (ref 30.0–36.5)
MCV: 87.2 FL (ref 80.0–99.0)
MPV: 11.5 FL (ref 8.9–12.9)
Monocytes %: 6.9 % (ref 5.0–13.0)
Monocytes Absolute: 0.62 K/UL (ref 0.00–1.00)
Neutrophils %: 77.4 % — ABNORMAL HIGH (ref 32.0–75.0)
Neutrophils Absolute: 6.95 K/UL (ref 1.80–8.00)
Nucleated RBCs: 0 /100{WBCs}
Platelets: 116 K/uL — ABNORMAL LOW (ref 150–400)
RBC: 4.75 M/uL (ref 3.80–5.20)
RDW: 13.8 % (ref 11.5–14.5)
WBC: 9 K/uL (ref 3.6–11.0)
nRBC: 0 K/uL (ref 0.00–0.01)

## 2024-04-14 LAB — LIPASE: Lipase: 867 U/L — ABNORMAL HIGH (ref 13–60)

## 2024-04-14 LAB — URINE CULTURE HOLD SAMPLE

## 2024-04-14 MED ORDER — NORMAL SALINE FLUSH 0.9 % IV SOLN
0.9 | Freq: Two times a day (BID) | INTRAVENOUS | Status: DC
Start: 2024-04-14 — End: 2024-04-17
  Administered 2024-04-15 – 2024-04-17 (×6): 10 mL via INTRAVENOUS

## 2024-04-14 MED ORDER — METOPROLOL TARTRATE 25 MG PO TABS
25 | Freq: Two times a day (BID) | ORAL | Status: DC
Start: 2024-04-14 — End: 2024-04-17
  Administered 2024-04-15 – 2024-04-17 (×6): 12.5 mg via ORAL

## 2024-04-14 MED ORDER — SERTRALINE HCL 50 MG PO TABS
50 | Freq: Every day | ORAL | Status: DC
Start: 2024-04-14 — End: 2024-04-17
  Administered 2024-04-14 – 2024-04-17 (×4): 100 mg via ORAL

## 2024-04-14 MED ORDER — ESCITALOPRAM OXALATE 10 MG PO TABS
10 | Freq: Every day | ORAL | Status: DC
Start: 2024-04-14 — End: 2024-04-17
  Administered 2024-04-14 – 2024-04-17 (×4): 5 mg via ORAL

## 2024-04-14 MED ORDER — NORMAL SALINE FLUSH 0.9 % IV SOLN
0.9 | INTRAVENOUS | Status: DC | PRN
Start: 2024-04-14 — End: 2024-04-17
  Administered 2024-04-17 (×2): 10 mL via INTRAVENOUS

## 2024-04-14 MED ORDER — ONDANSETRON HCL 4 MG/2ML IJ SOLN
4 | Freq: Once | INTRAMUSCULAR | Status: AC
Start: 2024-04-14 — End: 2024-04-14
  Administered 2024-04-14: 19:00:00 4 mg via INTRAVENOUS

## 2024-04-14 MED ORDER — HYDROMORPHONE 0.5MG/0.5ML IJ SOLN
1 | Status: DC | PRN
Start: 2024-04-14 — End: 2024-04-15
  Administered 2024-04-15 (×2): 0.5 mg via INTRAVENOUS

## 2024-04-14 MED ORDER — APIXABAN 5 MG PO TABS
5 | Freq: Two times a day (BID) | ORAL | Status: DC
Start: 2024-04-14 — End: 2024-04-17
  Administered 2024-04-15 – 2024-04-16 (×4): 5 mg via ORAL

## 2024-04-14 MED ORDER — IOPAMIDOL 76 % IV SOLN
76 | Freq: Once | INTRAVENOUS | Status: DC | PRN
Start: 2024-04-14 — End: 2024-04-17

## 2024-04-14 MED ORDER — ONDANSETRON HCL 4 MG/2ML IJ SOLN
4 | Freq: Four times a day (QID) | INTRAMUSCULAR | Status: DC | PRN
Start: 2024-04-14 — End: 2024-04-17
  Administered 2024-04-15 – 2024-04-17 (×3): 4 mg via INTRAVENOUS

## 2024-04-14 MED ORDER — POTASSIUM CHLORIDE ER 10 MEQ PO TBCR
10 | ORAL | Status: DC | PRN
Start: 2024-04-14 — End: 2024-04-17

## 2024-04-14 MED ORDER — POTASSIUM CHLORIDE 10 MEQ/100ML IV SOLN
10 | INTRAVENOUS | Status: DC | PRN
Start: 2024-04-14 — End: 2024-04-17

## 2024-04-14 MED ORDER — LACTATED RINGERS IV SOLN
INTRAVENOUS | Status: AC
Start: 2024-04-14 — End: 2024-04-15
  Administered 2024-04-14 – 2024-04-15 (×3): via INTRAVENOUS

## 2024-04-14 MED ORDER — POLYETHYLENE GLYCOL 3350 17 G PO PACK
17 | Freq: Every day | ORAL | Status: DC | PRN
Start: 2024-04-14 — End: 2024-04-17

## 2024-04-14 MED ORDER — MAGNESIUM SULFATE 2000 MG/50 ML IVPB PREMIX
2 | INTRAVENOUS | Status: DC | PRN
Start: 2024-04-14 — End: 2024-04-17

## 2024-04-14 MED ORDER — POTASSIUM BICARB-CITRIC ACID 20 MEQ PO TBEF
20 | ORAL | Status: DC | PRN
Start: 2024-04-14 — End: 2024-04-17

## 2024-04-14 MED ORDER — SODIUM CHLORIDE (PF) 0.9 % IJ SOLN
0.9 | Freq: Every day | INTRAMUSCULAR | Status: DC
Start: 2024-04-14 — End: 2024-04-17
  Administered 2024-04-14 – 2024-04-17 (×4): 40 mg via INTRAVENOUS

## 2024-04-14 MED ORDER — SODIUM CHLORIDE 0.9 % IV SOLN
0.9 | INTRAVENOUS | Status: DC | PRN
Start: 2024-04-14 — End: 2024-04-17
  Administered 2024-04-17: 12:00:00 via INTRAVENOUS

## 2024-04-14 MED ORDER — ONDANSETRON 4 MG PO TBDP
4 | Freq: Three times a day (TID) | ORAL | Status: DC | PRN
Start: 2024-04-14 — End: 2024-04-17

## 2024-04-14 MED ORDER — IPRATROPIUM-ALBUTEROL 0.5-2.5 (3) MG/3ML IN SOLN
0.5-2.5 | Freq: Four times a day (QID) | RESPIRATORY_TRACT | Status: DC | PRN
Start: 2024-04-14 — End: 2024-04-17

## 2024-04-14 MED ORDER — MORPHINE SULFATE (PF) 4 MG/ML IJ SOLN
4 | INTRAMUSCULAR | Status: AC
Start: 2024-04-14 — End: 2024-04-14
  Administered 2024-04-14: 19:00:00 4 mg via INTRAVENOUS

## 2024-04-14 MED FILL — SERTRALINE HCL 50 MG PO TABS: 50 mg | ORAL | Qty: 2

## 2024-04-14 MED FILL — ESCITALOPRAM OXALATE 10 MG PO TABS: 10 mg | ORAL | Qty: 1

## 2024-04-14 MED FILL — ISOVUE-370 76 % IV SOLN: 76 % | INTRAVENOUS | Qty: 100

## 2024-04-14 MED FILL — BD POSIFLUSH 0.9 % IV SOLN: 0.9 % | INTRAVENOUS | Qty: 40

## 2024-04-14 MED FILL — PANTOPRAZOLE SODIUM 40 MG IV SOLR: 40 mg | INTRAVENOUS | Qty: 40

## 2024-04-14 MED FILL — LACTATED RINGERS IV SOLN: INTRAVENOUS | Qty: 1000

## 2024-04-14 MED FILL — ONDANSETRON HCL 4 MG/2ML IJ SOLN: 4 MG/2ML | INTRAMUSCULAR | Qty: 2

## 2024-04-14 MED FILL — MORPHINE SULFATE 4 MG/ML IJ SOLN: 4 mg/mL | INTRAMUSCULAR | Qty: 1

## 2024-04-14 NOTE — ED Notes (Signed)
 TRANSFER - OUT REPORT:    Verbal report given to Therisa Peak on Breanna Lester  being transferred to [facility] 522 for routine progression of patient care       Report consisted of patient's Situation, Background, Assessment and   Recommendations(SBAR).     Information from the following report(s) Nurse Handoff Report, ED Encounter Summary, ED SBAR, [facility], and Recent Results was reviewed with the receiving nurse.    Kinder Fall Assessment:    Presents to emergency department  because of falls (Syncope, seizure, or loss of consciousness): No  Age > 70: No  Altered Mental Status, Intoxication with alcohol or substance confusion (Disorientation, impaired judgment, poor safety awaremess, or inability to follow instructions): No  Impaired Mobility: Ambulates or transfers with assistive devices or assistance; Unable to ambulate or transer.: No  Nursing Judgement: No          Lines:   Peripheral IV 04/14/24 Left Antecubital (Active)   Site Assessment Clean, dry & intact 04/14/24 1440   Line Status Normal saline locked;Flushed;Brisk blood return 04/14/24 1440   Line Care Cap changed 04/14/24 1440   Phlebitis Assessment No symptoms 04/14/24 1440   Infiltration Assessment 0 04/14/24 1440   Dressing Status Clean, dry & intact 04/14/24 1440   Dressing Type Transparent 04/14/24 1440   Dressing Intervention Dressing changed;New 04/14/24 1440        Opportunity for questions and clarification was provided.      Patient transported with:  O2 @ 3lpm

## 2024-04-14 NOTE — H&P (Signed)
 [facility]  [address] [place], [place]  [address]  [phone]    Hospital Medicine Admission History and Physical      NAME:  Breanna Lester   DOB:   15-Feb-1961   MRN:  [id number]     PCP:  Unknown, Provider, NP-C     Date of service:  04/14/2024         Subjective:     CHIEF COMPLAINT: Abdominal pain for 2 days    HISTORY OF PRESENT ILLNESS:     Breanna Lester is a 63 y.o.  Caucasian female who is admitted with abdominal pain.  Breanna Lester past medical history of liver cirrhosis, anxiety, depression, obesity, HTN presented to ER complaining of abdominal pain and nausea.  The abdominal pain is mostly on the upper abdominal area close the abdomen associated with nausea.  Patient had diarrhea about 2 days ago, but not today.  Patient denies fever.  The abdominal pain is moderate to severe intensity.    Past Medical History:   Diagnosis Date    Anxiety     Depression     Gastrointestinal disorder     Heart failure (HCC)     Liver disease 2009    liver failure    Pneumonia         Past Surgical History:   Procedure Laterality Date    ADENOIDECTOMY  1972    CATARACT REMOVAL      CESAREAN SECTION  1986 and 1993    DILATION AND CURETTAGE OF UTERUS  1999    EGD TRANSORAL BIOPSY SINGLE/MULTIPLE  05/17/2009         HYSTERECTOMY (CERVIX STATUS UNKNOWN)  2008    TONSILLECTOMY  1972       Social History     Tobacco Use    Smoking status: Former     Current packs/day: 0.00     Types: Cigarettes     Quit date: 02/28/2008     Years since quitting: 16.1    Smokeless tobacco: Never   Substance Use Topics    Alcohol use: Never        Family History   Problem Relation Age of Onset    Cancer Mother     Heart Disease Father         Allergies   Allergen Reactions    Latex Rash        Prior to Admission medications   Medication Sig Start Date End Date Taking? Authorizing Provider   albuterol  sulfate HFA (VENTOLIN  HFA) 108 (90 Base) MCG/ACT inhaler Inhale 2 puffs into the lungs 4 times daily as needed for  Wheezing 04/11/24 06/10/24 Yes Bertell Bathe, MD   chlorthalidone  (HYGROTON ) 25 MG tablet Take 1 tablet by mouth daily   Yes [provider]   sertraline  (ZOLOFT ) 100 MG tablet Take 1 tablet by mouth daily   Yes [provider]   apixaban  (ELIQUIS ) 5 MG TABS tablet Take 1 tablet by mouth 2 times daily   Yes [provider]   escitalopram  (LEXAPRO ) 5 MG tablet Take 1 tablet by mouth daily   Yes [provider]   sertraline  (ZOLOFT ) 50 MG tablet Take 1 tablet by mouth nightly   Yes [provider]   metoprolol  tartrate (LOPRESSOR ) 25 MG tablet Take 0.5 tablets by mouth 2 times daily  Patient not taking: Reported on 04/14/2024 04/11/24 06/10/24  Bertell Bathe, MD   predniSONE  (DELTASONE ) 20 MG tablet Take  2 tablets by mouth daily for 3 doses  Patient not taking: Reported on 04/14/2024 04/12/24 04/15/24  Bertell Bathe, MD   Blood Glucose Monitoring Suppl (BLOOD GLUCOSE MONITOR SYSTEM) w/Device KIT Pharmacist to identify preferred meter and strips.  Patient not taking: Reported on 04/14/2024 04/11/24   Alfrieda Klinefelter A, APRN - CNS   blood glucose monitor strips Test 1-2 times a day & as needed for symptoms of irregular blood glucose. Dispense sufficient amount for indicated testing frequency plus additional to accommodate PRN testing needs. Pharmacist to identify preferred brand.  Patient not taking: Reported on 04/14/2024 04/11/24   Alfrieda Klinefelter LABOR, APRN - CNS   Lancets 30G MISC Test 1-2 times a day & as needed for symptoms of irregular blood glucose. Dispense sufficient amount for indicated testing frequency plus additional to accommodate PRN testing needs. Pharmacist to identify preferred brand.  Patient not taking: Reported on 04/14/2024 04/11/24   Alfrieda Klinefelter LABOR, APRN - CNS   acetaminophen  (TYLENOL ) 325 MG tablet Take 1 tablet by mouth every 6 hours as needed for Pain 02/26/24 03/07/24  Cutchins, Morene DASEN, PA-C         Review of Systems:  (bold if positive, if negative)    Gen:   Eyes:  ENT:  CVS:  Pulm:  GI:  Abdominal pain, nauseaGU:  MS:  Skin:  Psych:  Endo:  Hem:  Renal:  Neuro:            Objective:      VITALS:    Vital signs reviewed; most recent are:    Vitals:    04/14/24 1714   BP: (!) 140/81   Pulse: 82   Resp: 16   Temp: 99 F (37.2 C)   SpO2: 97%     SpO2 Readings from Last 6 Encounters:   04/14/24 97%   04/11/24 95%   02/26/24 94%   02/18/24 92%   03/19/22 96%        No intake or output data in the 24 hours ending 04/14/24 1809         Exam:     Physical Exam:    Gen: Obese, in no acute distress  HEENT:  Pink conjunctivae, PERRL, hearing intact to voice, moist mucous membranes  Neck:  Supple, without masses, thyroid non-tender  Resp:  No accessory muscle use, clear breath sounds without wheezes rales or rhonchi  Card:  No murmurs, normal S1, S2 without thrills, bruits or peripheral edema  Abd:  Soft, non-tender, non-distended, normoactive bowel sounds are present, no palpable organomegaly and no detectable hernias  Lymph:  No cervical or inguinal adenopathy  Musc:  No cyanosis or clubbing  Skin:  No rashes or ulcers, skin turgor is good  Neuro:  Cranial nerves are grossly intact, no focal motor weakness, follows commands appropriately  Psych:  Good insight, oriented to person, place and time, alert       Labs:    Recent Labs     04/14/24  1313   WBC 9.0   HGB 13.1   HCT 41.4   PLT 116*     Recent Labs     04/14/24  1313   NA 140   K 3.6   CL 98   CO2 31*   BUN 16   ALT 27     No results found for: GLUCPOC  No results for input(s): PH, PCO2, PO2, HCO3, FIO2 in the last 72 hours.  No results for input(s): INR in the last  72 hours.           Assessment/Plan:    Abdominal pain/elevated lipase/diarrhea.  Abdominal exam shows tenderness upper abdominal area.  Although CT scan does not show acute pancreatitis, but with increased lipase and physical finding, this could be due to acute pancreatitis.  Check stool for Enterobacter.  Keep n.p.o., IV fluid, pain management  with IV Dilaudid .  Consult GI     2.  PAF (paroxysmal atrial fibrillation) (HCC).  Rate is better controlled.  Continue Eliquis  and metoprolol .    3.  HTN.  Continue metoprolol     4.  COPD.  Not in exacerbation.  DuoNeb  as needed     5.  Liver cirrhosis.  Advised to follow-up with hepatologist as an outpatient    6.  Thrombocytopenia.  Likely secondary to above.  Monitor       7.   Type 2 diabetes mellitus with hyperglycemia, without long-term current use of insulin  (HCC).  Diet controlled.  Monitor     8.  Morbid obesity.  Would benefit from weight loss    Previous medical records reviewed     Risk of deterioration: high      Total time spent with patient care Total time: 75 minutes. I personally saw and examined the patient during this time period.  Greater than 50% of this time was spent in counseling and coordination of care. minutes    I personally reviewed chart, notes, data and current medications in the medical record.  I have personally examined and treated the patient at bedside during this period.                 Care Plan discussed with: Patient, Nursing Staff, and >50% of time spent in counseling and coordination of care    Discussed:  Care Plan    Prophylaxis:  Eliquis     Probable Disposition:  Home w/Family           ___________________________________________________    Attending Physician: Turki Tapanes, MD

## 2024-04-14 NOTE — ED Notes (Signed)
 [facility] at bedside, SBAR report given, PT being transported to [facility]. [facility] [address]

## 2024-04-14 NOTE — ED Provider Notes (Signed)
 [facility] EMERGENCY DEPARTMENT  EMERGENCY DEPARTMENT ENCOUNTER      Pt Name: Breanna Lester  MRN: [id number]  Birthdate 1960/11/20  Date of evaluation: 04/14/2024  Provider: Charlie Kays, MD    CHIEF COMPLAINT       Chief Complaint   Patient presents with    Abdominal Pain         HISTORY OF PRESENT ILLNESS   (Location/Symptom, Timing/Onset, Context/Setting, Quality, Duration, Modifying Factors, Severity)  Note limiting factors.   63 year old with a history of diabetes, COPD, congestive heart failure, cirrhosis with hepatic encephalopathy, pneumonia, anxiety, depression, thrombocytopenia.  She presents with a 2-day history of upper abdominal pain.  It has been constant but waxes and wanes in intensity.  It is exacerbated by movement and p.o. intake.  She has had nausea but no vomiting.  She reports 4-5 episodes of loose stools per day.  They have been nonbloody.  No fever.  The pain radiates up into her chest at times.  Epic records were reviewed.  She was recently admitted to the hospital for a COPD exacerbation.          Review of External Medical Records:     Nursing Notes were reviewed.    REVIEW OF SYSTEMS    (2-9 systems for level 4, 10 or more for level 5)     Review of Systems    Except as noted above the remainder of the review of systems was reviewed and negative.       PAST MEDICAL HISTORY     Past Medical History:   Diagnosis Date    Anxiety     Depression     Gastrointestinal disorder     Heart failure (HCC)     Liver disease 2009    liver failure    Pneumonia          SURGICAL HISTORY       Past Surgical History:   Procedure Laterality Date    ADENOIDECTOMY  1972    CATARACT REMOVAL      CESAREAN SECTION  1986 and 1993    DILATION AND CURETTAGE OF UTERUS  1999    EGD TRANSORAL BIOPSY SINGLE/MULTIPLE  05/17/2009         HYSTERECTOMY (CERVIX STATUS UNKNOWN)  2008    TONSILLECTOMY  1972         CURRENT MEDICATIONS       Previous Medications    ACETAMINOPHEN  (TYLENOL ) 325 MG TABLET    Take 1 tablet by mouth  every 6 hours as needed for Pain    ALBUTEROL  SULFATE HFA (VENTOLIN  HFA) 108 (90 BASE) MCG/ACT INHALER    Inhale 2 puffs into the lungs 4 times daily as needed for Wheezing    APIXABAN  (ELIQUIS ) 5 MG TABS TABLET    Take 1 tablet by mouth 2 times daily    BLOOD GLUCOSE MONITOR STRIPS    Test 1-2 times a day & as needed for symptoms of irregular blood glucose. Dispense sufficient amount for indicated testing frequency plus additional to accommodate PRN testing needs. Pharmacist to identify preferred brand.    BLOOD GLUCOSE MONITORING SUPPL (BLOOD GLUCOSE MONITOR SYSTEM) W/DEVICE KIT    Pharmacist to identify preferred meter and strips.    CHLORTHALIDONE  (HYGROTON ) 25 MG TABLET    Take 1 tablet by mouth daily    ESCITALOPRAM  (LEXAPRO ) 5 MG TABLET    Take 1 tablet by mouth daily    LANCETS 30G MISC    Test 1-2 times  a day & as needed for symptoms of irregular blood glucose. Dispense sufficient amount for indicated testing frequency plus additional to accommodate PRN testing needs. Pharmacist to identify preferred brand.    METOPROLOL  TARTRATE (LOPRESSOR ) 25 MG TABLET    Take 0.5 tablets by mouth 2 times daily    PREDNISONE  (DELTASONE ) 20 MG TABLET    Take 2 tablets by mouth daily for 3 doses    SERTRALINE  (ZOLOFT ) 100 MG TABLET    Take 1 tablet by mouth daily    SERTRALINE  (ZOLOFT ) 50 MG TABLET    Take 1 tablet by mouth nightly       ALLERGIES     Latex    FAMILY HISTORY       Family History   Problem Relation Age of Onset    Cancer Mother     Heart Disease Father           SOCIAL HISTORY       Social History     Socioeconomic History    Marital status: Divorced     Spouse name: None    Number of children: None    Years of education: None    Highest education level: None   Tobacco Use    Smoking status: Former     Current packs/day: 0.00     Types: Cigarettes     Quit date: 02/28/2008     Years since quitting: 16.1    Smokeless tobacco: Never   Substance and Sexual Activity    Alcohol use: Never     Social Drivers of  Health     Transportation Needs: No Transportation Needs (04/08/2024)    PRAPARE - Transportation     Lack of Transportation (Medical): No     Lack of Transportation (Non-Medical): No   Housing Stability: Low Risk  (04/08/2024)    Housing Stability Vital Sign     Unable to Pay for Housing in the Last Year: No     Number of Times Moved in the Last Year: 0     Homeless in the Last Year: No           PHYSICAL EXAM    (up to 7 for level 4, 8 or more for level 5)     ED Triage Vitals   BP Girls Systolic BP Percentile Girls Diastolic BP Percentile Boys Systolic BP Percentile Boys Diastolic BP Percentile Temp Temp src Pulse   04/14/24 1310 -- -- -- -- 04/14/24 1310 -- 04/14/24 1310   (!) 152/83     98.2 F (36.8 C)  83      Respirations SpO2 Height Weight - Scale       04/14/24 1310 04/14/24 1310 04/14/24 1309 04/14/24 1309       20 97 % 1.575 m (5' 2) 122.5 kg (270 lb)           Body mass index is 49.38 kg/m.    Physical Exam  Vitals and nursing note reviewed.   Constitutional:       Appearance: Normal appearance. She is obese.   HENT:      Head: Normocephalic and atraumatic.      Mouth/Throat:      Mouth: Mucous membranes are moist.   Eyes:      Conjunctiva/sclera: Conjunctivae normal.   Cardiovascular:      Rate and Rhythm: Normal rate and regular rhythm.      Heart sounds: No murmur heard.     No friction rub. No  gallop.   Pulmonary:      Effort: Pulmonary effort is normal.      Breath sounds: Normal breath sounds.   Abdominal:      General: There is no distension.      Comments: Upper abdominal tenderness   Musculoskeletal:         General: No swelling or deformity.      Cervical back: No rigidity.   Skin:     General: Skin is warm and dry.   Neurological:      General: No focal deficit present.      Mental Status: She is alert.   Psychiatric:         Mood and Affect: Mood normal.         DIAGNOSTIC RESULTS     EKG: All EKG's are interpreted by the Emergency Department Physician who either signs or Co-signs this  chart in the absence of a cardiologist.    EKG: Normal sinus rhythm; rate of 82; PACs; nonspecific ST, T wave abnormalities.  Interpreted by me.  1:20 PM.  Charlie Kays, MD      RADIOLOGY:   Non-plain film images such as CT, Ultrasound and MRI are read by the radiologist. Plain radiographic images are visualized and preliminarily interpreted by the emergency physician with the below findings:        Interpretation per the Radiologist below, if available at the time of this note:    CT ABDOMEN PELVIS W IV CONTRAST Additional Contrast? None    (Results Pending)        LABS:  Labs Reviewed   CBC WITH AUTO DIFFERENTIAL - Abnormal; Notable for the following components:       Result Value    Platelets 116 (*)     Neutrophils % 77.4 (*)     All other components within normal limits   COMPREHENSIVE METABOLIC PANEL - Abnormal; Notable for the following components:    CO2 31 (*)     Glucose 139 (*)     BUN/Creatinine Ratio 23 (*)     AST 54 (*)     Alk Phosphatase 123 (*)     All other components within normal limits   URINALYSIS WITH MICROSCOPIC - Abnormal; Notable for the following components:    Ketones, Urine TRACE (*)     All other components within normal limits   LIPASE - Abnormal; Notable for the following components:    Lipase 867 (*)     All other components within normal limits   URINE CULTURE HOLD SAMPLE       All other labs were within normal range or not returned as of this dictation.    EMERGENCY DEPARTMENT COURSE and DIFFERENTIAL DIAGNOSIS/MDM:   Vitals:    Vitals:    04/14/24 1309 04/14/24 1310 04/14/24 1345   BP:  (!) 152/83 (!) 156/92   Pulse:  83    Resp:  20    Temp:  98.2 F (36.8 C)    SpO2:  97% 93%   Weight: 122.5 kg (270 lb)     Height: 1.575 m (5' 2)             Medical Decision Making  Amount and/or Complexity of Data Reviewed  Labs: ordered.  Radiology: ordered.  ECG/medicine tests: ordered.    Risk  Prescription drug management.            REASSESSMENT            CONSULTS:  Consult note: I  reached out to Dr. Mikie (hospitalist) via [facility] for admission.  Charlie Kays, MD  3:10 PM      PROCEDURES:  Unless otherwise noted below, none     Procedures      FINAL IMPRESSION      Perfect Serve Consult for Admission  3:06 PM    ED Room Number: C06/C06  Patient Name and age:  Breanna Lester 63 y.o.  female  Working Diagnosis: Pancreatitis    COVID-19 Suspicion: No  Sepsis present:  No  Reassessment needed: Yes  Code Status:  Full Code  Readmission: Yes  Isolation Requirements: no  Recommended Level of Care: med/surg  Department: [facility] ED - [phone]  63 year old with a history of diabetes, COPD, CHF, cirrhosis, pneumonia, and others.  She presents with a 2-day history of upper abdominal pain.  She had a recent admission for COPD.  Workup remarkable for a lipase of 867.  CT shows:  Cirrhosis with splenomegaly and mild ascites  Incidental cholelithiasis, periumbilical hernia, treated L2 fracture  Secondary to an inflammatory or infectious process or carcinomatosis. It has  qualities of both  Normal CT appearance of the pancreas at this time.  IV fluids, pain medicine, n.p.o.      DISPOSITION/PLAN   DISPOSITION        PATIENT REFERRED TO:  No follow-up provider specified.    DISCHARGE MEDICATIONS:  New Prescriptions    No medications on file         (Please note that portions of this note were completed with a voice recognition program.  Efforts were made to edit the dictations but occasionally words are mis-transcribed.)    Charlie Kays, MD (electronically signed)  Emergency Attending Physician / Physician Assistant / Nurse Practitioner             Kays Charlie SAUNDERS, MD  04/14/24 [phone]

## 2024-04-14 NOTE — ED Triage Notes (Addendum)
 Pt arrives to ER via EMS 214 c/o abd pain, nausea with no vomiting, started 2 days ago, no abd hx. Pt reports she was recently seen at [facility] for breathing issues and placed on O2 as needed.

## 2024-04-14 NOTE — Plan of Care (Signed)
 Problem: Chronic Conditions and Co-morbidities  Goal: Patient's chronic conditions and co-morbidity symptoms are monitored and maintained or improved  Outcome: Progressing     Problem: ABCDS Injury Assessment  Goal: Absence of physical injury  Outcome: Progressing     Problem: Safety - Adult  Goal: Free from fall injury  Outcome: Progressing     Problem: Pain  Goal: Verbalizes/displays adequate comfort level or baseline comfort level  Outcome: Progressing

## 2024-04-15 ENCOUNTER — Inpatient Hospital Stay: Admit: 2024-04-15 | Payer: Medicare (Managed Care) | Primary: Diagnostic Radiology

## 2024-04-15 ENCOUNTER — Inpatient Hospital Stay: Payer: Medicare (Managed Care) | Primary: Diagnostic Radiology

## 2024-04-15 LAB — CBC WITH AUTO DIFFERENTIAL
Basophils %: 0.2 % (ref 0.0–1.0)
Basophils Absolute: 0.03 K/UL (ref 0.00–0.10)
Eosinophils %: 0 % (ref 0.0–7.0)
Eosinophils Absolute: 0 K/UL (ref 0.00–0.40)
Hematocrit: 43.3 % (ref 35.0–47.0)
Hemoglobin: 13.4 g/dL (ref 11.5–16.0)
Immature Granulocytes %: 0.6 % — ABNORMAL HIGH (ref 0.0–0.5)
Immature Granulocytes Absolute: 0.08 K/UL — ABNORMAL HIGH (ref 0.00–0.04)
Lymphocytes %: 6.2 % — ABNORMAL LOW (ref 12.0–49.0)
Lymphocytes Absolute: 0.81 K/UL (ref 0.80–3.50)
MCH: 27.3 pg (ref 26.0–34.0)
MCHC: 30.9 g/dL (ref 30.0–36.5)
MCV: 88.4 FL (ref 80.0–99.0)
MPV: 11.8 FL (ref 8.9–12.9)
Monocytes %: 5.8 % (ref 5.0–13.0)
Monocytes Absolute: 0.76 K/UL (ref 0.00–1.00)
Neutrophils %: 87.2 % — ABNORMAL HIGH (ref 32.0–75.0)
Neutrophils Absolute: 11.4 K/UL — ABNORMAL HIGH (ref 1.80–8.00)
Nucleated RBCs: 0 /100{WBCs}
Platelets: 157 K/uL (ref 150–400)
RBC: 4.9 M/uL (ref 3.80–5.20)
RDW: 14.1 % (ref 11.5–14.5)
WBC: 13.1 K/uL — ABNORMAL HIGH (ref 3.6–11.0)
nRBC: 0 K/uL (ref 0.00–0.01)

## 2024-04-15 LAB — CULTURE, BLOOD 1: Culture: NO GROWTH

## 2024-04-15 LAB — COMPREHENSIVE METABOLIC PANEL
ALT: 34 U/L (ref 10–35)
AST: 40 U/L — ABNORMAL HIGH (ref 10–35)
Albumin/Globulin Ratio: 0.8 — ABNORMAL LOW (ref 1.1–2.2)
Albumin: 2.9 g/dL — ABNORMAL LOW (ref 3.5–5.2)
Alk Phosphatase: 114 U/L — ABNORMAL HIGH (ref 35–104)
Anion Gap: 12 mmol/L (ref 2–14)
BUN/Creatinine Ratio: 17 (ref 12–20)
BUN: 20 mg/dL (ref 8–23)
CO2: 28 mmol/L (ref 20–29)
Calcium: 9 mg/dL (ref 8.8–10.2)
Chloride: 97 mmol/L — ABNORMAL LOW (ref 98–107)
Creatinine: 1.17 mg/dL — ABNORMAL HIGH (ref 0.60–1.00)
Est, Glom Filt Rate: 52 ml/min/1.73m2 — ABNORMAL LOW (ref >90)
Globulin: 3.4 g/dL (ref 2.0–4.0)
Glucose: 160 mg/dL — ABNORMAL HIGH (ref 65–100)
Potassium: 4.9 mmol/L (ref 3.5–5.1)
Sodium: 137 mmol/L (ref 136–145)
Total Bilirubin: 1 mg/dL (ref 0.0–1.2)
Total Protein: 6.3 g/dL — ABNORMAL LOW (ref 6.4–8.3)

## 2024-04-15 LAB — TRIGLYCERIDES: Triglycerides: 127 mg/dL (ref 0–150)

## 2024-04-15 LAB — LACTIC ACID: Lactic Acid: 1.6 mmol/L (ref 0.5–2.0)

## 2024-04-15 LAB — CULTURE, BLOOD 2: Culture: NO GROWTH

## 2024-04-15 MED ORDER — HYDROMORPHONE HCL PF 1 MG/ML IJ SOLN
1 | INTRAMUSCULAR | Status: DC | PRN
Start: 2024-04-15 — End: 2024-04-17
  Administered 2024-04-15 – 2024-04-17 (×4): 1 mg via INTRAVENOUS

## 2024-04-15 MED ORDER — STERILE WATER FOR INJECTION (MIXTURES ONLY)
2 | INTRAMUSCULAR | Status: DC
Start: 2024-04-15 — End: 2024-04-17
  Administered 2024-04-16 – 2024-04-17 (×2): 2000 mg via INTRAVENOUS

## 2024-04-15 MED ORDER — OXYCODONE HCL 5 MG PO TABS
5 | ORAL | Status: DC | PRN
Start: 2024-04-15 — End: 2024-04-17

## 2024-04-15 MED ORDER — STERILE WATER FOR INJECTION (MIXTURES ONLY)
2 | INTRAMUSCULAR | Status: DC
Start: 2024-04-15 — End: 2024-04-15
  Administered 2024-04-15: 13:00:00 2000 mg via INTRAVENOUS

## 2024-04-15 MED ORDER — STERILE WATER FOR INJECTION (MIXTURES ONLY)
1 | INTRAMUSCULAR | Status: DC
Start: 2024-04-15 — End: 2024-04-15

## 2024-04-15 MED ORDER — OXYCODONE HCL 5 MG PO TABS
5 | ORAL | Status: DC | PRN
Start: 2024-04-15 — End: 2024-04-17
  Administered 2024-04-16 – 2024-04-17 (×5): 10 mg via ORAL

## 2024-04-15 MED ORDER — AZITHROMYCIN 250 MG PO TABS
250 | ORAL | Status: DC
Start: 2024-04-15 — End: 2024-04-17
  Administered 2024-04-15 – 2024-04-17 (×3): 500 mg via ORAL

## 2024-04-15 MED FILL — HYDROMORPHONE HCL 1 MG/ML IJ SOLN: 1 mg/mL | INTRAMUSCULAR | Qty: 0.5

## 2024-04-15 MED FILL — HYDROMORPHONE HCL 1 MG/ML IJ SOLN: 1 mg/mL | INTRAMUSCULAR | Qty: 1

## 2024-04-15 MED FILL — ELIQUIS 5 MG PO TABS: 5 mg | ORAL | Qty: 1

## 2024-04-15 MED FILL — CEFTRIAXONE SODIUM 2 G IJ SOLR: 2 g | INTRAMUSCULAR | Qty: 2000

## 2024-04-15 MED FILL — METOPROLOL TARTRATE 25 MG PO TABS: 25 mg | ORAL | Qty: 1

## 2024-04-15 MED FILL — ONDANSETRON HCL 4 MG/2ML IJ SOLN: 4 MG/2ML | INTRAMUSCULAR | Qty: 2

## 2024-04-15 MED FILL — AZITHROMYCIN 250 MG PO TABS: 250 mg | ORAL | Qty: 2

## 2024-04-15 MED FILL — PANTOPRAZOLE SODIUM 40 MG IV SOLR: 40 mg | INTRAVENOUS | Qty: 40

## 2024-04-15 MED FILL — ESCITALOPRAM OXALATE 10 MG PO TABS: 10 mg | ORAL | Qty: 1

## 2024-04-15 MED FILL — SERTRALINE HCL 50 MG PO TABS: 50 mg | ORAL | Qty: 2

## 2024-04-15 NOTE — Progress Notes (Signed)
 Pt. States she needs a nebulizer. Has not had a PFT and qualifies for home O2 with 2L on exertion and RA at rest. Pt. Does not have a pulmonary doctor and will need a consult and establishment with PAR. Interested in ANS[facility].

## 2024-04-15 NOTE — Consults (Signed)
 [name], ACNP-BC  [phone] office  [phone] voicemail   Gastroenterology Consultation Note      Admit Date: 04/14/2024  Consult Date: 04/15/2024   I greatly appreciate your asking me to see Breanna Lester, thank you very much for the opportunity to participate in her care.    Narrative Assessment and Plan   Breanna Lester is admitted with abdominal pain, nausea and diarrhea. CT abdo/pelvis shows no acute findings, confirms cirrhosis and mild ascites, gallstones without biliary dilation. Tbili normal, Mild elevation in AST, Alk Phos. Mild leukocytosis. No anemia or TTP. No PT/INR done.     Recommend:  Check stool studies given report of acute diarrhea  Check PT/INR to calculate MELD  Unclear cause of cirrhosis, so will obtain serologies  HIDA scan to r/o biliary cause of acute post-prandial abdominal pain  If neg, consider EGD Thursday  Continue supportive care    Thank you for the consultation.    Subjective:     Chief Complaint: Abdominal Pain and diarrhea    History of Present Illness: 63 yo F with history of HTN, CHF, afib on Eliquis , and cirrhosis is admitted with post-prandial abdominal pain and nausea. She also has associated diarrhea that started several days ago. She reports 1-2 loose stools per day without urgency. Stool has been dark. She was on adShe reports that she was diagnosed with cirrhosis in 2008 following a prolonged illness. Denies ETOH. She does not currently follow with a hepatologist. She has had endoscopy remotely. She was recently discharged from here after admission for COPD exacerbation and had been on a course of prednisone . No abx. She has previously been on lactulose.     PCP:  Unknown, Provider, NP-C    Past Medical History:   Diagnosis Date    Anxiety     Depression     Gastrointestinal disorder     Heart failure (HCC)     Liver disease 2009    liver failure    Pneumonia         Past Surgical History:   Procedure Laterality Date    ADENOIDECTOMY   1972    CATARACT REMOVAL      CESAREAN SECTION  1986 and 1993    DILATION AND CURETTAGE OF UTERUS  1999    EGD TRANSORAL BIOPSY SINGLE/MULTIPLE  05/17/2009         HYSTERECTOMY (CERVIX STATUS UNKNOWN)  2008    TONSILLECTOMY  1972       Social History     Tobacco Use    Smoking status: Former     Current packs/day: 0.00     Types: Cigarettes     Quit date: 02/28/2008     Years since quitting: 16.1    Smokeless tobacco: Never   Substance Use Topics    Alcohol use: Never        Family History   Problem Relation Age of Onset    Cancer Mother     Heart Disease Father         Allergies   Allergen Reactions    Latex Rash            Home Medications:  Prior to Admission Medications   Prescriptions Last Dose Informant Patient Reported? Taking?   Blood Glucose Monitoring Suppl (BLOOD GLUCOSE MONITOR SYSTEM) w/Device KIT Not Taking  No No   Sig: Pharmacist to identify preferred meter and strips.   Patient not taking: Reported on 04/14/2024   Lancets 30G  MISC Not Taking  No No   Sig: Test 1-2 times a day & as needed for symptoms of irregular blood glucose. Dispense sufficient amount for indicated testing frequency plus additional to accommodate PRN testing needs. Pharmacist to identify preferred brand.   Patient not taking: Reported on 04/14/2024   acetaminophen  (TYLENOL ) 325 MG tablet   No No   Sig: Take 1 tablet by mouth every 6 hours as needed for Pain   albuterol  sulfate HFA (VENTOLIN  HFA) 108 (90 Base) MCG/ACT inhaler Past Month  No Yes   Sig: Inhale 2 puffs into the lungs 4 times daily as needed for Wheezing   apixaban  (ELIQUIS ) 5 MG TABS tablet 04/13/2024 Bedtime  Yes Yes   Sig: Take 1 tablet by mouth 2 times daily   blood glucose monitor strips Not Taking  No No   Sig: Test 1-2 times a day & as needed for symptoms of irregular blood glucose. Dispense sufficient amount for indicated testing frequency plus additional to accommodate PRN testing needs. Pharmacist to identify preferred brand.   Patient not taking: Reported on  04/14/2024   chlorthalidone  (HYGROTON ) 25 MG tablet 04/13/2024 Morning  Yes Yes   Sig: Take 1 tablet by mouth daily   escitalopram  (LEXAPRO ) 5 MG tablet 04/13/2024 Bedtime  Yes Yes   Sig: Take 1 tablet by mouth daily   metoprolol  tartrate (LOPRESSOR ) 25 MG tablet Not Taking  No No   Sig: Take 0.5 tablets by mouth 2 times daily   Patient not taking: Reported on 04/14/2024   predniSONE  (DELTASONE ) 20 MG tablet Not Taking  No No   Sig: Take 2 tablets by mouth daily for 3 doses   Patient not taking: Reported on 04/14/2024   sertraline  (ZOLOFT ) 100 MG tablet 04/13/2024 Morning  Yes Yes   Sig: Take 1 tablet by mouth daily   sertraline  (ZOLOFT ) 50 MG tablet 04/13/2024 Bedtime  Yes Yes   Sig: Take 1 tablet by mouth nightly      Facility-Administered Medications: None       Hospital Medications:  Current Facility-Administered Medications   Medication Dose Route Frequency    cefTRIAXone  (ROCEPHIN ) 2,000 mg in sterile water  20 mL IV syringe  2,000 mg IntraVENous Q24H    HYDROmorphone  HCl PF (DILAUDID ) injection 1 mg  1 mg IntraVENous Q4H PRN    iopamidol  (ISOVUE -370) 76 % injection 100 mL  100 mL IntraVENous ONCE PRN    apixaban  (ELIQUIS ) tablet 5 mg  5 mg Oral BID    escitalopram  (LEXAPRO ) tablet 5 mg  5 mg Oral Daily    sertraline  (ZOLOFT ) tablet 100 mg  100 mg Oral Daily    sodium chloride  flush 0.9 % injection 5-40 mL  5-40 mL IntraVENous 2 times per day    sodium chloride  flush 0.9 % injection 5-40 mL  5-40 mL IntraVENous PRN    0.9 % sodium chloride  infusion   IntraVENous PRN    potassium chloride  (KLOR-CON ) extended release tablet 40 mEq  40 mEq Oral PRN    Or    potassium bicarb-citric acid  (EFFER-K) effervescent tablet 40 mEq  40 mEq Oral PRN    Or    potassium chloride  10 mEq/100 mL IVPB (Peripheral Line)  10 mEq IntraVENous PRN    magnesium  sulfate 2000 mg in 50 mL IVPB premix  2,000 mg IntraVENous PRN    ondansetron  (ZOFRAN -ODT) disintegrating tablet 4 mg  4 mg Oral Q8H PRN    Or    ondansetron  (ZOFRAN ) injection 4 mg  4  mg IntraVENous Q6H PRN    polyethylene glycol (GLYCOLAX ) packet 17 g  17 g Oral Daily PRN    lactated ringers  infusion   IntraVENous Continuous    pantoprazole  (PROTONIX ) 40 mg in sodium chloride  (PF) 0.9 % 10 mL injection  40 mg IntraVENous Daily    metoprolol  tartrate (LOPRESSOR ) tablet 12.5 mg  12.5 mg Oral BID    ipratropium 0.5 mg-albuterol  2.5 mg (DUONEB ) nebulizer solution 1 Dose  1 Dose Inhalation Q6H PRN       Review of Systems: Admission ROS by Mesfin A Tefera, MD from 04/14/2024 were reviewed with the patient and changes (other than per HPI) include: none      Objective:     Physical Exam:  Vitals:    04/15/24 0807   BP: (!) 141/67   Pulse: 69   Resp: 18   Temp: 98.1 F (36.7 C)   SpO2: 100%     SpO2 Readings from Last 6 Encounters:   04/15/24 100%   04/11/24 95%   02/26/24 94%   02/18/24 92%   03/19/22 96%          Intake/Output Summary (Last 24 hours) at 04/15/2024 1133  Last data filed at 04/14/2024 2015  Gross per 24 hour   Intake 0 ml   Output 0 ml   Net 0 ml      General: no distress, comfortable, morbidly obese  Skin:  No rash or palpable dermatologic mass lesions  HEENT: Pupils equal, sclera anicteric, oropharynx with no gross lesions  Cardiovascular: No abnormal audible heart sounds, well perfused, no edema  Respiratory:  No abnormal audible breath sounds, normal respiratory effort, no throacic deformity  GI:  Abdomen nondistended, tender, no mass, no free fluid, no rebound or guarding.  Musculoskeletal:  No skeletal deformity nor acute arthritis noted.  Neurological:  Motor and sensory function intact in upper extremeties; no asterixis  Psychiatric:  Normal affect, memory intact, appears to have insight into current illness  Lymphatic:  No cervical, supraclavicular, or periumbilic lymphadenopathy    Laboratory:    Recent Results (from the past 24 hours)   CBC with Auto Differential    Collection Time: 04/14/24  1:13 PM   Result Value Ref Range    WBC 9.0 3.6 - 11.0 K/uL    RBC 4.75 3.80 - 5.20  M/uL    Hemoglobin 13.1 11.5 - 16.0 g/dL    Hematocrit 58.5 64.9 - 47.0 %    MCV 87.2 80.0 - 99.0 FL    MCH 27.6 26.0 - 34.0 PG    MCHC 31.6 30.0 - 36.5 g/dL    RDW 86.1 88.4 - 85.4 %    Platelets 116 (L) 150 - 400 K/uL    MPV 11.5 8.9 - 12.9 FL    Nucleated RBCs 0.0 0 PER 100 WBC    nRBC 0.00 0.00 - 0.01 K/uL    Neutrophils % 77.4 (H) 32.0 - 75.0 %    Lymphocytes % 14.3 12.0 - 49.0 %    Monocytes % 6.9 5.0 - 13.0 %    Eosinophils % 1.1 0.0 - 7.0 %    Basophils % 0.2 0.0 - 1.0 %    Immature Granulocytes % 0.1 0.0 - 0.5 %    Neutrophils Absolute 6.95 1.80 - 8.00 K/UL    Lymphocytes Absolute 1.28 0.80 - 3.50 K/UL    Monocytes Absolute 0.62 0.00 - 1.00 K/UL    Eosinophils Absolute 0.10 0.00 - 0.40 K/UL  Basophils Absolute 0.02 0.00 - 0.10 K/UL    Immature Granulocytes Absolute 0.01 0.00 - 0.04 K/UL    Differential Type AUTOMATED     Comprehensive Metabolic Panel    Collection Time: 04/14/24  1:13 PM   Result Value Ref Range    Sodium 140 136 - 145 mmol/L    Potassium 3.6 3.5 - 5.1 mmol/L    Chloride 98 98 - 107 mmol/L    CO2 31 (H) 22 - 29 mmol/L    Anion Gap 11 2 - 12 mmol/L    Glucose 139 (H) 65 - 100 mg/dL    BUN 16 8 - 23 MG/DL    Creatinine 9.28 9.49 - 0.90 MG/DL    BUN/Creatinine Ratio 23 (H) 12 - 20      Est, Glom Filt Rate >90 >60 ml/min/1.14m2    Calcium 9.3 8.8 - 10.2 MG/DL    Total Bilirubin 0.9 0.0 - 1.2 MG/DL    ALT 27 10 - 35 U/L    AST 54 (H) 10 - 35 U/L    Alk Phosphatase 123 (H) 35 - 104 U/L    Total Protein 6.8 6.4 - 8.3 g/dL    Albumin 3.5 3.5 - 5.2 g/dL    Globulin 3.3 2.0 - 4.0 g/dL    Albumin/Globulin Ratio 1.1 1.1 - 2.2     Lipase    Collection Time: 04/14/24  1:13 PM   Result Value Ref Range    Lipase 867 (H) 13 - 60 U/L   EKG 12 Lead    Collection Time: 04/14/24  1:18 PM   Result Value Ref Range    Ventricular Rate 82 BPM    Atrial Rate 82 BPM    P-R Interval 150 ms    QRS Duration 82 ms    Q-T Interval 420 ms    QTc Calculation (Bazett) 490 ms    P Axis 85 degrees    R Axis 73 degrees    T  Axis 71 degrees    Diagnosis       Sinus rhythm with premature atrial complexes with aberrant conduction  T wave abnormality, consider anterior ischemia  Prolonged QT  Abnormal ECG  When compared with ECG of 08-Apr-2024 02:39,  aberrant conduction is now present  Confirmed by Fernand MD., Ronda ([id number]) on 04/14/2024 7:55:06 PM     Urinalysis with Microscopic    Collection Time: 04/14/24  1:47 PM   Result Value Ref Range    Color, UA YELLOW/STRAW      Appearance CLEAR CLEAR      Specific Gravity, UA 1.015 1.003 - 1.030      pH, Urine 6.5 5.0 - 8.0      Protein, UA Negative NEG mg/dL    Glucose, Ur Negative NEG mg/dL    Ketones, Urine TRACE (A) NEG mg/dL    Bilirubin, Urine Negative NEG      Blood, Urine Negative NEG      Urobilinogen, Urine 0.2 0.2 - 1.0 EU/dL    Nitrite, Urine Negative NEG      Leukocyte Esterase, Urine Negative NEG      WBC, UA 0-4 0 - 4 /hpf    RBC, UA 0-5 /hpf    Epithelial Cells, UA FEW FEW /lpf    BACTERIA, URINE Negative NEG /hpf   Urine Culture Hold Sample    Collection Time: 04/14/24  1:47 PM    Specimen: Urine   Result Value Ref Range    Specimen HOld  Urine on hold in Microbiology dept for 2 days.  If unpreserved urine is submitted, it cannot be used for addtional testing after 24 hours, recollection will be required.   CBC with Auto Differential    Collection Time: 04/15/24  4:25 AM   Result Value Ref Range    WBC 13.1 (H) 3.6 - 11.0 K/uL    RBC 4.90 3.80 - 5.20 M/uL    Hemoglobin 13.4 11.5 - 16.0 g/dL    Hematocrit 56.6 64.9 - 47.0 %    MCV 88.4 80.0 - 99.0 FL    MCH 27.3 26.0 - 34.0 PG    MCHC 30.9 30.0 - 36.5 g/dL    RDW 85.8 88.4 - 85.4 %    Platelets 157 150 - 400 K/uL    MPV 11.8 8.9 - 12.9 FL    Nucleated RBCs 0.0 0 PER 100 WBC    nRBC 0.00 0.00 - 0.01 K/uL    Neutrophils % 87.2 (H) 32.0 - 75.0 %    Lymphocytes % 6.2 (L) 12.0 - 49.0 %    Monocytes % 5.8 5.0 - 13.0 %    Eosinophils % 0.0 0.0 - 7.0 %    Basophils % 0.2 0.0 - 1.0 %    Immature Granulocytes % 0.6 (H) 0.0 - 0.5 %     Neutrophils Absolute 11.40 (H) 1.80 - 8.00 K/UL    Lymphocytes Absolute 0.81 0.80 - 3.50 K/UL    Monocytes Absolute 0.76 0.00 - 1.00 K/UL    Eosinophils Absolute 0.00 0.00 - 0.40 K/UL    Basophils Absolute 0.03 0.00 - 0.10 K/UL    Immature Granulocytes Absolute 0.08 (H) 0.00 - 0.04 K/UL    Differential Type AUTOMATED     Comprehensive Metabolic Panel    Collection Time: 04/15/24  4:25 AM   Result Value Ref Range    Sodium 137 136 - 145 mmol/L    Potassium 4.9 3.5 - 5.1 mmol/L    Chloride 97 (L) 98 - 107 mmol/L    CO2 28 20 - 29 mmol/L    Anion Gap 12 2 - 14 mmol/L    Glucose 160 (H) 65 - 100 mg/dL    BUN 20 8 - 23 MG/DL    Creatinine 8.82 (H) 0.60 - 1.00 MG/DL    BUN/Creatinine Ratio 17 12 - 20      Est, Glom Filt Rate 52 (L) >90 ml/min/1.62m2    Calcium 9.0 8.8 - 10.2 MG/DL    Total Bilirubin 1.0 0.0 - 1.2 MG/DL    ALT 34 10 - 35 U/L    AST 40 (H) 10 - 35 U/L    Alk Phosphatase 114 (H) 35 - 104 U/L    Total Protein 6.3 (L) 6.4 - 8.3 g/dL    Albumin 2.9 (L) 3.5 - 5.2 g/dL    Globulin 3.4 2.0 - 4.0 g/dL    Albumin/Globulin Ratio 0.8 (L) 1.1 - 2.2     Triglyceride    Collection Time: 04/15/24  4:25 AM   Result Value Ref Range    Triglycerides 127 0 - 150 MG/DL         Assessment/Plan:     Principal Problem:    Abdominal pain  Active Problems:    Thrombocytopenia    Cholelithiasis    PAF (paroxysmal atrial fibrillation) (HCC)    COPD (chronic obstructive pulmonary disease) (HCC)    Type 2 diabetes mellitus with hyperglycemia, without long-term current use of insulin  (HCC)    Liver  cirrhosis (HCC)    CAD (coronary artery disease)    HTN (hypertension)    Pancreatitis  Resolved Problems:    * No resolved hospital problems. *       See above narrative for full detail.

## 2024-04-15 NOTE — Progress Notes (Addendum)
 [facility]  [address] [place], [place] [address]  [phone]        Hospitalist Progress Note      NAME: Breanna Lester   DOB:  May 12, 1961  MRM:  [id number]    Date/Time of service: 04/15/2024  12:42 PM       Subjective:     Patient was personally seen and examined by me during this time period.  Chart reviewed.    Follow up sepsis 2/2 PNA, abd pain 2/2 acute pancreatitis.    Patient reports: ongoing abd pain. Otherwise no complaints       Objective:       Vitals:       Last 24hrs VS reviewed since prior progress note. Most recent are:    Vitals:    04/15/24 0807   BP: (!) 141/67   Pulse: 69   Resp: 18   Temp: 98.1 F (36.7 C)   SpO2: 100%     SpO2 Readings from Last 6 Encounters:   04/15/24 100%   04/11/24 95%   02/26/24 94%   02/18/24 92%   03/19/22 96%          Intake/Output Summary (Last 24 hours) at 04/15/2024 1242  Last data filed at 04/14/2024 2015  Gross per 24 hour   Intake 0 ml   Output 0 ml   Net 0 ml        Exam:     Physical Exam:    Gen:  Well-developed, well-nourished, in no acute distress  HEENT:  Pink conjunctivae, EOMI, hearing intact to voice, moist mucous membranes  Resp:  No accessory muscle use, clear breath sounds without wheezes rales or rhonchi  Card:  No murmurs, normal S1, S2 without thrills, bruits or peripheral edema  Abd:  Soft, TTP LUQ and epigastric, non-distended, no palpable organomegaly and no detectable hernias  Musc:  No cyanosis or clubbing  Skin:  No rashes or ulcers, skin turgor is good  Neuro:  Cranial nerves 3-12 are grossly intact, grip strength is 5/5 bilaterally and dorsi / plantarflexion is 5/5 bilaterally, follows commands appropriately  Psych:  Good insight, oriented to person, place and time, alert      Medications Reviewed: (see below)    Lab Data Reviewed: (see below)    ______________________________________________________________________    Medications:     Current Facility-Administered Medications   Medication Dose Route  Frequency    cefTRIAXone  (ROCEPHIN ) 2,000 mg in sterile water  20 mL IV syringe  2,000 mg IntraVENous Q24H    HYDROmorphone  HCl PF (DILAUDID ) injection 1 mg  1 mg IntraVENous Q4H PRN    iopamidol  (ISOVUE -370) 76 % injection 100 mL  100 mL IntraVENous ONCE PRN    apixaban  (ELIQUIS ) tablet 5 mg  5 mg Oral BID    escitalopram  (LEXAPRO ) tablet 5 mg  5 mg Oral Daily    sertraline  (ZOLOFT ) tablet 100 mg  100 mg Oral Daily    sodium chloride  flush 0.9 % injection 5-40 mL  5-40 mL IntraVENous 2 times per day    sodium chloride  flush 0.9 % injection 5-40 mL  5-40 mL IntraVENous PRN    0.9 % sodium chloride  infusion   IntraVENous PRN    potassium chloride  (KLOR-CON ) extended release tablet 40 mEq  40 mEq Oral PRN    Or    potassium bicarb-citric acid  (EFFER-K) effervescent tablet 40 mEq  40 mEq Oral PRN    Or    potassium  chloride 10 mEq/100 mL IVPB (Peripheral Line)  10 mEq IntraVENous PRN    magnesium  sulfate 2000 mg in 50 mL IVPB premix  2,000 mg IntraVENous PRN    ondansetron  (ZOFRAN -ODT) disintegrating tablet 4 mg  4 mg Oral Q8H PRN    Or    ondansetron  (ZOFRAN ) injection 4 mg  4 mg IntraVENous Q6H PRN    polyethylene glycol (GLYCOLAX ) packet 17 g  17 g Oral Daily PRN    lactated ringers  infusion   IntraVENous Continuous    pantoprazole  (PROTONIX ) 40 mg in sodium chloride  (PF) 0.9 % 10 mL injection  40 mg IntraVENous Daily    metoprolol  tartrate (LOPRESSOR ) tablet 12.5 mg  12.5 mg Oral BID    ipratropium 0.5 mg-albuterol  2.5 mg (DUONEB ) nebulizer solution 1 Dose  1 Dose Inhalation Q6H PRN          Lab Review:     Recent Labs     04/14/24  1313 04/15/24  0425   WBC 9.0 13.1*   HGB 13.1 13.4   HCT 41.4 43.3   PLT 116* 157     Recent Labs     04/14/24  1313 04/15/24  0425   NA 140 137   K 3.6 4.9   CL 98 97*   CO2 31* 28   BUN 16 20   ALT 27 34     No results found for: GLUCPOC       Assessment / Plan:     Sepsis 2/2 pneumonia: POA. SIRS 2/4 (WBC 13.1, temp 100.8). CT abd neg for infection. CT chest reveals PNA.  - CTX for  3 days and DC on augmentin to complete treatment course  - check lactic  - too late to collect BCx    Abdominal pain/elevated lipase/diarrhea: POA. Abdominal exam shows tenderness upper abdominal area.  Although CT scan does not show acute pancreatitis, but with increased lipase and symptoms she does meet criteria for acute pancreatitis. Ddx also includes SBP  - follow up enteric panel  - IVF, NPO  - VI dilauddi PRN  - GI consult  - If cause of pain is SBP, it will be addressed with above abx     Paroxysmal afib and HTN: POA. Chronic. Rate controlled  - Continue Eliquis  and metoprolol .     COPD: POA. Not in exacerbation.  - DuoNeb  as needed     Cr bump: Maybe 2/2 IVVD from pancreatitis  - IVF and monitor     Liver cirrhosis: POA.  - Advised to follow-up with hepatologist as an outpatient     Thrombocytopenia: POA. Mild. resolved    IDDM: POA. Chronic. A1c above goal  - Hold home meds  - SSI for now  - will need to escalate home regimen at discharge    Morbid Obesity: POA. Chronic. At higher risk of complications of hospitalization. Body mass index is 49.38 kg/m.  - Outpatient diet and lifestyle changes for weight loss  - Recommend OSA testing  - Consider GLP1    In caring for this patient today, I reviewed recent notes, interpreted lab results and imaging tests, had a face-to-face encounter with the patient, performing the medically necessary appropriate exam and history, counseled the patient/family/caregiver, ordered medications, tests and procedures as indicated for diagnostic and therapeutic purposes, consulted and communicated with other healthcare professionals including care coordination and discharge planning, and documented clinical information in the electronic health record.  Care Plan discussed with: Patient, Care Manager, and Nursing Staff    Discussed:  Care Plan and D/C Planning    Prophylaxis:  eliquis     Disposition:  Home  w/Family           ___________________________________________________    Attending Physician: Othella Crock, MD

## 2024-04-15 NOTE — Progress Notes (Signed)
 Spiritual Health History and Assessment/Progress Note  [facility]    Loneliness/Social Isolation,  ,  ,      Name: Breanna Lester MRN: [id number]    Age: 63 y.o.     Sex: female   Language: English   Religion: Baptist   Abdominal pain     Date: 04/15/2024            Total Time Calculated: 52 min              Spiritual Assessment began in [facility] B5 MULTI-SPECIALTY ONCOLOGY 2            Encounter Overview/Reason: Loneliness/Social Isolation  Service Provided For: Patient    Faith, Belief, Meaning:   Patient identifies as spiritual, is connected with a faith tradition or spiritual practice, and has beliefs or practices that help with coping during difficult times  Family/Friends No family/friends present      Importance and Influence:  Patient has spiritual/personal beliefs that influence decisions regarding their health  Family/Friends No family/friends present    Community:  Patient feels well-supported. Support system includes: Other: no one other than her daughter with autism   Family/Friends No family/friends present    Assessment and Plan of Care:   Reviewed chart prior to bedside visit. Breanna Lester welcomes visit, shares life review and medical reasons why she is here and discouraged about hospital has found new things wrong. She shared that she has 2 daughters, and she cares for one daughter who lives with her and is a high functioning autistic person, and her other daughter lives out of state and is a rocky relationship, she said. She offered how she copes, by turning to her Breanna Lester faith. She said she does not like organized religion so she stays away from it, but tries to nurture her faith by believing and praying. She shared life stories and asked for prayer with hopefulness of recovery and discharge soon. Chaplain listened empathetically with a caring, supportive presence and offered supportive words and availability of Chaplains and prayer for her. [name] expressed much appreciation.        Patient Interventions include: Facilitated expression of thoughts and feelings, Explored spiritual coping/struggle/distress, Engaged in Gaffer, Affirmed coping skills/support systems, and Facilitated life review and/ or legacy  Family/Friends Interventions include: No family/friends present    Patient Plan of Care: Spiritual Care available upon further referral  Family/Friends Plan of Care: No family/friends present    Electronically signed by Lamar Gowers, [facility] on 04/15/2024 at 12:04 PM

## 2024-04-15 NOTE — Consults (Signed)
 ORTHOPAEDIC CONSULT NOTE    Subjective:     Date of Consultation:  April 15, 2024      Breanna Lester is a 63 y.o. female who is being seen secondary to CT finding of L2 khypoplasty with known hx of chronic pain issue per chart review. Per pt the khypoplasty was done 2 years ago at [facility], she does have chronic low back pain that she attributes to being in the bed more lately, she denies recent injury, denies leg pain, numbness/tingling, bowel/bladder incontinence. Pt has been admitted for GI issues. Pt denies leg weakness that limits her ability to walk.     Patient Active Problem List    Diagnosis Date Noted    Abdominal pain 04/14/2024    Liver cirrhosis (HCC) 04/14/2024    CAD (coronary artery disease) 04/14/2024    HTN (hypertension) 04/14/2024    Pancreatitis 04/14/2024    Type 2 diabetes mellitus with hyperglycemia, without long-term current use of insulin  (HCC) 04/11/2024    COPD (chronic obstructive pulmonary disease) (HCC) 04/10/2024    Acute hypoxic respiratory failure (HCC) 04/08/2024    PAF (paroxysmal atrial fibrillation) (HCC) 05/31/2022    Cholelithiasis 02/25/2011    Thrombocytopenia 05/25/2009    Gait abnormality 05/01/2009    Alcoholic cirrhosis of liver (HCC) 05/01/2009    Hypokalemia 05/01/2009    Hypomagnesemia 05/01/2009    Nausea with vomiting 05/01/2009    Hepatic encephalopathy (HCC) 05/01/2009     Family History   Problem Relation Age of Onset    Cancer Mother     Heart Disease Father       Social History     Tobacco Use    Smoking status: Former     Current packs/day: 0.00     Types: Cigarettes     Quit date: 02/28/2008     Years since quitting: 16.1    Smokeless tobacco: Never   Substance Use Topics    Alcohol use: Never     Past Medical History:   Diagnosis Date    Anxiety     Depression     Gastrointestinal disorder     Heart failure (HCC)     Liver disease 2009    liver failure    Pneumonia       Past Surgical History:   Procedure Laterality Date    ADENOIDECTOMY  1972    CATARACT  REMOVAL      CESAREAN SECTION  1986 and 1993    DILATION AND CURETTAGE OF UTERUS  1999    EGD TRANSORAL BIOPSY SINGLE/MULTIPLE  05/17/2009         HYSTERECTOMY (CERVIX STATUS UNKNOWN)  2008    TONSILLECTOMY  1972      Prior to Admission medications   Medication Sig Start Date End Date Taking? Authorizing Provider   albuterol  sulfate HFA (VENTOLIN  HFA) 108 (90 Base) MCG/ACT inhaler Inhale 2 puffs into the lungs 4 times daily as needed for Wheezing 04/11/24 06/10/24 Yes Bertell Bathe, MD   chlorthalidone  (HYGROTON ) 25 MG tablet Take 1 tablet by mouth daily   Yes [provider]   sertraline  (ZOLOFT ) 100 MG tablet Take 1 tablet by mouth daily   Yes [provider]   apixaban  (ELIQUIS ) 5 MG TABS tablet Take 1 tablet by mouth 2 times daily   Yes [provider]   escitalopram  (LEXAPRO ) 5 MG tablet Take 1 tablet by mouth daily   Yes [provider]   sertraline  (ZOLOFT ) 50 MG tablet Take  1 tablet by mouth nightly   Yes [provider]   metoprolol  tartrate (LOPRESSOR ) 25 MG tablet Take 0.5 tablets by mouth 2 times daily  Patient not taking: Reported on 04/14/2024 04/11/24 06/10/24  Bertell Bathe, MD   predniSONE  (DELTASONE ) 20 MG tablet Take 2 tablets by mouth daily for 3 doses  Patient not taking: Reported on 04/14/2024 04/12/24 04/15/24  Bertell Bathe, MD   Blood Glucose Monitoring Suppl (BLOOD GLUCOSE MONITOR SYSTEM) w/Device KIT Pharmacist to identify preferred meter and strips.  Patient not taking: Reported on 04/14/2024 04/11/24   Alfrieda Klinefelter A, APRN - CNS   blood glucose monitor strips Test 1-2 times a day & as needed for symptoms of irregular blood glucose. Dispense sufficient amount for indicated testing frequency plus additional to accommodate PRN testing needs. Pharmacist to identify preferred brand.  Patient not taking: Reported on 04/14/2024 04/11/24   Alfrieda Klinefelter LABOR, APRN - CNS   Lancets 30G MISC Test 1-2 times a day & as needed for symptoms of irregular blood glucose.  Dispense sufficient amount for indicated testing frequency plus additional to accommodate PRN testing needs. Pharmacist to identify preferred brand.  Patient not taking: Reported on 04/14/2024 04/11/24   Alfrieda Klinefelter LABOR, APRN - CNS   acetaminophen  (TYLENOL ) 325 MG tablet Take 1 tablet by mouth every 6 hours as needed for Pain 02/26/24 03/07/24  Cutchins, Morene DASEN, PA-C     Current Facility-Administered Medications   Medication Dose Route Frequency    cefTRIAXone  (ROCEPHIN ) 2,000 mg in sterile water  20 mL IV syringe  2,000 mg IntraVENous Q24H    HYDROmorphone  HCl PF (DILAUDID ) injection 1 mg  1 mg IntraVENous Q4H PRN    iopamidol  (ISOVUE -370) 76 % injection 100 mL  100 mL IntraVENous ONCE PRN    apixaban  (ELIQUIS ) tablet 5 mg  5 mg Oral BID    escitalopram  (LEXAPRO ) tablet 5 mg  5 mg Oral Daily    sertraline  (ZOLOFT ) tablet 100 mg  100 mg Oral Daily    sodium chloride  flush 0.9 % injection 5-40 mL  5-40 mL IntraVENous 2 times per day    sodium chloride  flush 0.9 % injection 5-40 mL  5-40 mL IntraVENous PRN    0.9 % sodium chloride  infusion   IntraVENous PRN    potassium chloride  (KLOR-CON ) extended release tablet 40 mEq  40 mEq Oral PRN    Or    potassium bicarb-citric acid  (EFFER-K) effervescent tablet 40 mEq  40 mEq Oral PRN    Or    potassium chloride  10 mEq/100 mL IVPB (Peripheral Line)  10 mEq IntraVENous PRN    magnesium  sulfate 2000 mg in 50 mL IVPB premix  2,000 mg IntraVENous PRN    ondansetron  (ZOFRAN -ODT) disintegrating tablet 4 mg  4 mg Oral Q8H PRN    Or    ondansetron  (ZOFRAN ) injection 4 mg  4 mg IntraVENous Q6H PRN    polyethylene glycol (GLYCOLAX ) packet 17 g  17 g Oral Daily PRN    lactated ringers  infusion   IntraVENous Continuous    pantoprazole  (PROTONIX ) 40 mg in sodium chloride  (PF) 0.9 % 10 mL injection  40 mg IntraVENous Daily    metoprolol  tartrate (LOPRESSOR ) tablet 12.5 mg  12.5 mg Oral BID    ipratropium 0.5 mg-albuterol  2.5 mg (DUONEB ) nebulizer solution 1 Dose  1 Dose Inhalation Q6H  PRN      Allergies   Allergen Reactions    Latex Rash        Review of  Systems:  A comprehensive review of systems was negative except for that written in the HPI.    Mental Status: no dementia    Objective:     Patient Vitals for the past 8 hrs:   BP Temp Temp src Pulse Resp SpO2   04/15/24 0807 (!) 141/67 98.1 F (36.7 C) Oral 69 18 100 %     Temp (24hrs), Avg:99.4 F (37.4 C), Min:98.1 F (36.7 C), Max:100.8 F (38.2 C)      Gen: Well-developed,  in no acute distress   Musc: good bed mobility   RLE - HF/ KE/ DF/PF all 5/5, NVI, no clonus   LLE -  HF/ KE/ DF/PF all 5/5, NVI, no clonus    Skin: No skin breakdown noted. Skin warm, pink, dry  Neuro: Cranial nerves are grossly intact, no focal motor weakness, follows commands appropriately   Psych: Good insight, oriented to person, place and time, alert    Imaging Review:   Narrative:     EXAM: CT ABDOMEN PELVIS WO CONTRAST    INDICATION: Upper abdominal pain/tenderness/elevated lipase    COMPARISON: 2012    IV CONTRAST: None.    ORAL CONTRAST: None    TECHNIQUE:  Thin axial images were obtained through the abdomen and pelvis. Coronal and  sagittal reformats were generated. CT dose reduction was achieved through use of  a standardized protocol tailored for this examination and automatic exposure  control for dose modulation.    The absence of intravenous contrast material reduces the sensitivity for  evaluation of the vasculature and solid organs.    FINDINGS:  LOWER THORAX: Epicardial fat.  LIVER: No mass. Macrolobulated liver.  BILIARY TREE: Gallbladder contains multiple gallstones. CBD is not dilated.  SPLEEN: Splenomegaly.  PANCREAS: No focal abnormality.  ADRENALS: Unremarkable.  KIDNEYS/URETERS: No calculus or hydronephrosis.  STOMACH: Unremarkable.  SMALL BOWEL: No dilatation or wall thickening.  COLON: No dilatation or wall thickening.  APPENDIX: Not visualized  PERITONEUM: Platelike misty mesentery measures 5.9 x 12 x 2 cm in size (coronal  image  30).[name]  RETROPERITONEUM: No lymphadenopathy or aortic aneurysm.  REPRODUCTIVE ORGANS:  URINARY BLADDER: No mass or calculus.  BONES: Treated L2 fracture.  ABDOMINAL WALL: Periumbilical hernia containing fat that has a neck measurement  of 16 mm and outer diameter of 57 mm..  ADDITIONAL COMMENTS: N/A   Impression:     Cirrhosis with splenomegaly and mild ascites  Incidental cholelithiasis, periumbilical hernia, treated L2 fracture  Secondary to an inflammatory or infectious process or carcinomatosis. It has  qualities of both  Normal CT appearance of the pancreas at this time       Labs:   Recent Results (from the past 24 hours)   CBC with Auto Differential    Collection Time: 04/14/24  1:13 PM   Result Value Ref Range    WBC 9.0 3.6 - 11.0 K/uL    RBC 4.75 3.80 - 5.20 M/uL    Hemoglobin 13.1 11.5 - 16.0 g/dL    Hematocrit 58.5 64.9 - 47.0 %    MCV 87.2 80.0 - 99.0 FL    MCH 27.6 26.0 - 34.0 PG    MCHC 31.6 30.0 - 36.5 g/dL    RDW 86.1 88.4 - 85.4 %    Platelets 116 (L) 150 - 400 K/uL    MPV 11.5 8.9 - 12.9 FL    Nucleated RBCs 0.0 0 PER 100 WBC    nRBC 0.00 0.00 - 0.01 K/uL  Neutrophils % 77.4 (H) 32.0 - 75.0 %    Lymphocytes % 14.3 12.0 - 49.0 %    Monocytes % 6.9 5.0 - 13.0 %    Eosinophils % 1.1 0.0 - 7.0 %    Basophils % 0.2 0.0 - 1.0 %    Immature Granulocytes % 0.1 0.0 - 0.5 %    Neutrophils Absolute 6.95 1.80 - 8.00 K/UL    Lymphocytes Absolute 1.28 0.80 - 3.50 K/UL    Monocytes Absolute 0.62 0.00 - 1.00 K/UL    Eosinophils Absolute 0.10 0.00 - 0.40 K/UL    Basophils Absolute 0.02 0.00 - 0.10 K/UL    Immature Granulocytes Absolute 0.01 0.00 - 0.04 K/UL    Differential Type AUTOMATED     Comprehensive Metabolic Panel    Collection Time: 04/14/24  1:13 PM   Result Value Ref Range    Sodium 140 136 - 145 mmol/L    Potassium 3.6 3.5 - 5.1 mmol/L    Chloride 98 98 - 107 mmol/L    CO2 31 (H) 22 - 29 mmol/L    Anion Gap 11 2 - 12 mmol/L    Glucose 139 (H) 65 - 100 mg/dL    BUN 16 8 - 23 MG/DL    Creatinine 9.28  9.49 - 0.90 MG/DL    BUN/Creatinine Ratio 23 (H) 12 - 20      Est, Glom Filt Rate >90 >60 ml/min/1.78m2    Calcium 9.3 8.8 - 10.2 MG/DL    Total Bilirubin 0.9 0.0 - 1.2 MG/DL    ALT 27 10 - 35 U/L    AST 54 (H) 10 - 35 U/L    Alk Phosphatase 123 (H) 35 - 104 U/L    Total Protein 6.8 6.4 - 8.3 g/dL    Albumin 3.5 3.5 - 5.2 g/dL    Globulin 3.3 2.0 - 4.0 g/dL    Albumin/Globulin Ratio 1.1 1.1 - 2.2     Lipase    Collection Time: 04/14/24  1:13 PM   Result Value Ref Range    Lipase 867 (H) 13 - 60 U/L   EKG 12 Lead    Collection Time: 04/14/24  1:18 PM   Result Value Ref Range    Ventricular Rate 82 BPM    Atrial Rate 82 BPM    P-R Interval 150 ms    QRS Duration 82 ms    Q-T Interval 420 ms    QTc Calculation (Bazett) 490 ms    P Axis 85 degrees    R Axis 73 degrees    T Axis 71 degrees    Diagnosis       Sinus rhythm with premature atrial complexes with aberrant conduction  T wave abnormality, consider anterior ischemia  Prolonged QT  Abnormal ECG  When compared with ECG of 08-Apr-2024 02:39,  aberrant conduction is now present  Confirmed by Fernand MD., Ronda ([id number]) on 04/14/2024 7:55:06 PM     Urinalysis with Microscopic    Collection Time: 04/14/24  1:47 PM   Result Value Ref Range    Color, UA YELLOW/STRAW      Appearance CLEAR CLEAR      Specific Gravity, UA 1.015 1.003 - 1.030      pH, Urine 6.5 5.0 - 8.0      Protein, UA Negative NEG mg/dL    Glucose, Ur Negative NEG mg/dL    Ketones, Urine TRACE (A) NEG mg/dL    Bilirubin, Urine Negative NEG  Blood, Urine Negative NEG      Urobilinogen, Urine 0.2 0.2 - 1.0 EU/dL    Nitrite, Urine Negative NEG      Leukocyte Esterase, Urine Negative NEG      WBC, UA 0-4 0 - 4 /hpf    RBC, UA 0-5 /hpf    Epithelial Cells, UA FEW FEW /lpf    BACTERIA, URINE Negative NEG /hpf   Urine Culture Hold Sample    Collection Time: 04/14/24  1:47 PM    Specimen: Urine   Result Value Ref Range    Specimen HOld        Urine on hold in Microbiology dept for 2 days.  If unpreserved urine is  submitted, it cannot be used for addtional testing after 24 hours, recollection will be required.   CBC with Auto Differential    Collection Time: 04/15/24  4:25 AM   Result Value Ref Range    WBC 13.1 (H) 3.6 - 11.0 K/uL    RBC 4.90 3.80 - 5.20 M/uL    Hemoglobin 13.4 11.5 - 16.0 g/dL    Hematocrit 56.6 64.9 - 47.0 %    MCV 88.4 80.0 - 99.0 FL    MCH 27.3 26.0 - 34.0 PG    MCHC 30.9 30.0 - 36.5 g/dL    RDW 85.8 88.4 - 85.4 %    Platelets 157 150 - 400 K/uL    MPV 11.8 8.9 - 12.9 FL    Nucleated RBCs 0.0 0 PER 100 WBC    nRBC 0.00 0.00 - 0.01 K/uL    Neutrophils % 87.2 (H) 32.0 - 75.0 %    Lymphocytes % 6.2 (L) 12.0 - 49.0 %    Monocytes % 5.8 5.0 - 13.0 %    Eosinophils % 0.0 0.0 - 7.0 %    Basophils % 0.2 0.0 - 1.0 %    Immature Granulocytes % 0.6 (H) 0.0 - 0.5 %    Neutrophils Absolute 11.40 (H) 1.80 - 8.00 K/UL    Lymphocytes Absolute 0.81 0.80 - 3.50 K/UL    Monocytes Absolute 0.76 0.00 - 1.00 K/UL    Eosinophils Absolute 0.00 0.00 - 0.40 K/UL    Basophils Absolute 0.03 0.00 - 0.10 K/UL    Immature Granulocytes Absolute 0.08 (H) 0.00 - 0.04 K/UL    Differential Type AUTOMATED     Comprehensive Metabolic Panel    Collection Time: 04/15/24  4:25 AM   Result Value Ref Range    Sodium 137 136 - 145 mmol/L    Potassium 4.9 3.5 - 5.1 mmol/L    Chloride 97 (L) 98 - 107 mmol/L    CO2 28 20 - 29 mmol/L    Anion Gap 12 2 - 14 mmol/L    Glucose 160 (H) 65 - 100 mg/dL    BUN 20 8 - 23 MG/DL    Creatinine 8.82 (H) 0.60 - 1.00 MG/DL    BUN/Creatinine Ratio 17 12 - 20      Est, Glom Filt Rate 52 (L) >90 ml/min/1.9m2    Calcium 9.0 8.8 - 10.2 MG/DL    Total Bilirubin 1.0 0.0 - 1.2 MG/DL    ALT 34 10 - 35 U/L    AST 40 (H) 10 - 35 U/L    Alk Phosphatase 114 (H) 35 - 104 U/L    Total Protein 6.3 (L) 6.4 - 8.3 g/dL    Albumin 2.9 (L) 3.5 - 5.2 g/dL    Globulin 3.4 2.0 - 4.0 g/dL  Albumin/Globulin Ratio 0.8 (L) 1.1 - 2.2     Triglyceride    Collection Time: 04/15/24  4:25 AM   Result Value Ref Range    Triglycerides 127 0 - 150  MG/DL         Impression:     Patient Active Problem List    Diagnosis Date Noted    Abdominal pain 04/14/2024    Liver cirrhosis (HCC) 04/14/2024    CAD (coronary artery disease) 04/14/2024    HTN (hypertension) 04/14/2024    Pancreatitis 04/14/2024    Type 2 diabetes mellitus with hyperglycemia, without long-term current use of insulin  (HCC) 04/11/2024    COPD (chronic obstructive pulmonary disease) (HCC) 04/10/2024    Acute hypoxic respiratory failure (HCC) 04/08/2024    PAF (paroxysmal atrial fibrillation) (HCC) 05/31/2022    Cholelithiasis 02/25/2011    Thrombocytopenia 05/25/2009    Gait abnormality 05/01/2009    Alcoholic cirrhosis of liver (HCC) 05/01/2009    Hypokalemia 05/01/2009    Hypomagnesemia 05/01/2009    Nausea with vomiting 05/01/2009    Hepatic encephalopathy (HCC) 05/01/2009     Principal Problem:    Abdominal pain  Active Problems:    Thrombocytopenia    Cholelithiasis    PAF (paroxysmal atrial fibrillation) (HCC)    COPD (chronic obstructive pulmonary disease) (HCC)    Type 2 diabetes mellitus with hyperglycemia, without long-term current use of insulin  (HCC)    Liver cirrhosis (HCC)    CAD (coronary artery disease)    HTN (hypertension)    Pancreatitis  Resolved Problems:    * No resolved hospital problems. *      Plan:   -  Acute on Chronic Back Pain with hx of L2 compression fracture - imaging and results reviewed with radiologist, stable khypoplasty without surrounding edema, exam unremarkable and advise pain management for abd pain and chronic back pain, advise pt to follow up with Dr. Floretta in 2 weeks to establish care as she is new to the area. Ortho will sign off       Dr. Lansing aware and agrees with plan as above.        Randall Free, APRN - NP  Orthopedic Nurse Practitioner   [facility]

## 2024-04-15 NOTE — Care Coordination-Inpatient (Signed)
 Care Management Progress Note    Reason for Admission:   Abdominal pain [R10.9]  Acute pancreatitis without infection or necrosis, unspecified pancreatitis type [K85.90]         Patient Admission Status: Inpatient  RUR:  20%  Hospitalization in the last 30 days (Readmission):  Yes        Pt transferred to the 5th floor.  EMR reviewed and handoff received from previous case manager [facility])  READMISSION:  Pt was admitted to [facility] from 8/19 - 8/22 and she was readmitted on 8/25.  Reportedly, pt resides at the [facility] in [place] located at [address] [place], [place]  [address] (room 114).    Transition Plan of Care:  Plan is for pt to return to [facility]  [employer] supplies home oxygen (ordered at previous admission)  Outpatient follow up  [facility] or [employer] transport at discharge    CM will continue to follow pt until discharged.  D.Tomeshia Pizzi

## 2024-04-16 ENCOUNTER — Inpatient Hospital Stay: Admit: 2024-04-16 | Payer: Medicare (Managed Care) | Primary: Diagnostic Radiology

## 2024-04-16 LAB — CBC
Hematocrit: 39.6 % (ref 35.0–47.0)
Hemoglobin: 12.1 g/dL (ref 11.5–16.0)
MCH: 27.5 pg (ref 26.0–34.0)
MCHC: 30.6 g/dL (ref 30.0–36.5)
MCV: 90 FL (ref 80.0–99.0)
MPV: 11.6 FL (ref 8.9–12.9)
Nucleated RBCs: 0 /100{WBCs}
Platelets: 113 K/uL — ABNORMAL LOW (ref 150–400)
RBC: 4.4 M/uL (ref 3.80–5.20)
RDW: 14.3 % (ref 11.5–14.5)
WBC: 12.2 K/uL — ABNORMAL HIGH (ref 3.6–11.0)
nRBC: 0 K/uL (ref 0.00–0.01)

## 2024-04-16 LAB — ANA, DIRECT, W/REFLEX: ANA: NEGATIVE

## 2024-04-16 LAB — IRON AND TIBC
Iron % Saturation: 17 %
Iron: 57 ug/dL (ref 37–145)
TIBC: 334 ug/dL (ref 250–450)
UIBC: 277 ug/dL (ref 112.0–347.0)

## 2024-04-16 LAB — FERRITIN: Ferritin: 123 ng/mL (ref 13–252)

## 2024-04-16 LAB — HEPATITIS PANEL, ACUTE
Hep A IgM: NONREACTIVE
Hep B Core Ab, IgM: NONREACTIVE
Hepatitis B Surface Ag: NONREACTIVE
Hepatitis C Ab: NONREACTIVE

## 2024-04-16 LAB — VITAMIN B12 & FOLATE
Folate: 14.8 ng/mL (ref 4.8–24.2)
Vitamin B-12: 825 pg/mL (ref 232–1245)

## 2024-04-16 MED ORDER — TECHNETIUM TC 99M MEBROFENIN IV KIT
Freq: Once | INTRAVENOUS | Status: AC
Start: 2024-04-16 — End: 2024-04-16
  Administered 2024-04-16: 12:00:00 6 via INTRAVENOUS

## 2024-04-16 MED ORDER — STERILE WATER FOR INJECTION IJ SOLN
INTRAMUSCULAR | Status: AC
Start: 2024-04-16 — End: 2024-04-16

## 2024-04-16 MED ORDER — SINCALIDE 5 MCG IJ SOLR
5 | INTRAMUSCULAR | Status: AC
Start: 2024-04-16 — End: 2024-04-16

## 2024-04-16 MED ORDER — SINCALIDE 5 MCG IJ SOLR
5 | Freq: Once | INTRAMUSCULAR | Status: AC
Start: 2024-04-16 — End: 2024-04-16
  Administered 2024-04-16: 14:00:00 2.45 ug/kg via INTRAVENOUS

## 2024-04-16 MED FILL — ELIQUIS 5 MG PO TABS: 5 mg | ORAL | Qty: 1

## 2024-04-16 MED FILL — SINCALIDE 5 MCG IJ SOLR: 5 ug | INTRAMUSCULAR | Qty: 2.45

## 2024-04-16 MED FILL — METOPROLOL TARTRATE 25 MG PO TABS: 25 mg | ORAL | Qty: 1

## 2024-04-16 MED FILL — HYDROMORPHONE HCL 1 MG/ML IJ SOLN: 1 mg/mL | INTRAMUSCULAR | Qty: 1

## 2024-04-16 MED FILL — PANTOPRAZOLE SODIUM 40 MG IV SOLR: 40 mg | INTRAVENOUS | Qty: 40

## 2024-04-16 MED FILL — STERILE WATER FOR INJECTION IJ SOLN: INTRAMUSCULAR | Qty: 10

## 2024-04-16 MED FILL — CEFTRIAXONE SODIUM 2 G IJ SOLR: 2 g | INTRAMUSCULAR | Qty: 2000

## 2024-04-16 MED FILL — ONDANSETRON HCL 4 MG/2ML IJ SOLN: 4 MG/2ML | INTRAMUSCULAR | Qty: 2

## 2024-04-16 MED FILL — OXYCODONE HCL 5 MG PO TABS: 5 mg | ORAL | Qty: 2

## 2024-04-16 MED FILL — AZITHROMYCIN 250 MG PO TABS: 250 mg | ORAL | Qty: 2

## 2024-04-16 MED FILL — SINCALIDE 5 MCG IJ SOLR: 5 ug | INTRAMUSCULAR | Qty: 5

## 2024-04-16 MED FILL — SERTRALINE HCL 50 MG PO TABS: 50 mg | ORAL | Qty: 2

## 2024-04-16 MED FILL — ESCITALOPRAM OXALATE 10 MG PO TABS: 10 mg | ORAL | Qty: 1

## 2024-04-16 NOTE — Plan of Care (Signed)
 Problem: Chronic Conditions and Co-morbidities  Goal: Patient's chronic conditions and co-morbidity symptoms are monitored and maintained or improved  04/16/2024 2258 by Maclin Guerrette G, RN  Outcome: Progressing  04/16/2024 1119 by Tonette Gift, RN  Outcome: Progressing     Problem: ABCDS Injury Assessment  Goal: Absence of physical injury  04/16/2024 2258 by Armanie Martine, Darliss MATSU, RN  Outcome: Progressing  04/16/2024 1119 by Tonette Gift, RN  Outcome: Progressing     Problem: Safety - Adult  Goal: Free from fall injury  04/16/2024 2258 by Janara Klett, Darliss MATSU, RN  Outcome: Progressing  04/16/2024 1119 by Tonette Gift, RN  Outcome: Progressing  Flowsheets (Taken 04/16/2024 0800)  Free From Fall Injury: Instruct family/caregiver on patient safety     Problem: Pain  Goal: Verbalizes/displays adequate comfort level or baseline comfort level  04/16/2024 2258 by Hillary Struss, Darliss MATSU, RN  Outcome: Progressing  04/16/2024 1119 by Tonette Gift, RN  Outcome: Progressing     Problem: ABCDS Injury Assessment  Goal: Absence of physical injury  04/16/2024 2258 by Tag Wurtz, Darliss MATSU, RN  Outcome: Progressing  04/16/2024 1119 by Tonette Gift, RN  Outcome: Progressing     Problem: Pain  Goal: Verbalizes/displays adequate comfort level or baseline comfort level  04/16/2024 2258 by Kristofer Schaffert, Darliss MATSU, RN  Outcome: Progressing  04/16/2024 1119 by Tonette Gift, RN  Outcome: Progressing

## 2024-04-16 NOTE — Plan of Care (Signed)
 Problem: Chronic Conditions and Co-morbidities  Goal: Patient's chronic conditions and co-morbidity symptoms are monitored and maintained or improved  04/16/2024 1119 by Tonette Gift, RN  Outcome: Progressing  04/16/2024 0112 by Steve Domino, RN  Outcome: Progressing     Problem: ABCDS Injury Assessment  Goal: Absence of physical injury  04/16/2024 1119 by Tonette Gift, RN  Outcome: Progressing  04/16/2024 0112 by Steve Domino, RN  Outcome: Progressing     Problem: Safety - Adult  Goal: Free from fall injury  04/16/2024 1119 by Tonette Gift, RN  Outcome: Progressing  Flowsheets (Taken 04/16/2024 0800)  Free From Fall Injury: Instruct family/caregiver on patient safety  04/16/2024 0112 by Steve Domino, RN  Outcome: Progressing     Problem: Pain  Goal: Verbalizes/displays adequate comfort level or baseline comfort level  04/16/2024 1119 by Tonette Gift, RN  Outcome: Progressing  04/16/2024 0112 by Steve Domino, RN  Outcome: Progressing

## 2024-04-16 NOTE — Plan of Care (Signed)
 Problem: Chronic Conditions and Co-morbidities  Goal: Patient's chronic conditions and co-morbidity symptoms are monitored and maintained or improved  Outcome: Progressing     Problem: ABCDS Injury Assessment  Goal: Absence of physical injury  Outcome: Progressing     Problem: Safety - Adult  Goal: Free from fall injury  04/16/2024 0112 by Steve Domino, RN  Outcome: Progressing  04/15/2024 1450 by Teresa Kung, RN  Outcome: Progressing     Problem: Pain  Goal: Verbalizes/displays adequate comfort level or baseline comfort level  Outcome: Progressing

## 2024-04-16 NOTE — Progress Notes (Signed)
 [facility] [place] [facility]  [address] [place], [place] [address]  [phone]        Hospitalist Progress Note      NAME: Breanna Lester   DOB:  [birth year]  MRM:  [id number]    Date/Time of service: 04/16/2024  2:43 PM       Subjective:     Patient was personally seen and examined by me during this time period.  Chart reviewed.    Follow up sepsis 2/2 PNA, abd pain 2/2 acute pancreatitis.    Patient reports: Denies abdominal pain shortness of breath and chest pain.  Was enjoying a regular solid meal when I went to evaluate her.       Objective:       Vitals:       Last 24hrs VS reviewed since prior progress note. Most recent are:    Vitals:    04/16/24 0352   BP: 132/60   Pulse: 72   Resp: 16   Temp: 98.4 F (36.9 C)   SpO2: 98%     SpO2 Readings from Last 6 Encounters:   04/16/24 98%   04/11/24 95%   02/26/24 94%   02/18/24 92%   03/19/22 96%          Intake/Output Summary (Last 24 hours) at 04/16/2024 1443  Last data filed at 04/15/2024 2041  Gross per 24 hour   Intake 0 ml   Output --   Net 0 ml        Exam:     Physical Exam:    Gen:  Well-developed, well-nourished, in no acute distress  HEENT:  Pink conjunctivae, EOMI, hearing intact to voice, moist mucous membranes  Resp:  No accessory muscle use, clear breath sounds without wheezes rales or rhonchi  Card:  No murmurs, normal S1, S2 without thrills, bruits or peripheral edema  Abd: Soft nontender nondistended no rebound or guard  Musc:  No cyanosis or clubbing  Skin:  No rashes or ulcers, skin turgor is good  Neuro:  Cranial nerves 3-12 are grossly intact, grip strength is 5/5 bilaterally and dorsi / plantarflexion is 5/5 bilaterally, follows commands appropriately  Psych:  Good insight, oriented to person, place and time, alert      Medications Reviewed: (see below)    Lab Data Reviewed: (see below)    ______________________________________________________________________    Medications:     Current Facility-Administered Medications    Medication Dose Route Frequency    HYDROmorphone  HCl PF (DILAUDID ) injection 1 mg  1 mg IntraVENous Q4H PRN    cefTRIAXone  (ROCEPHIN ) 2,000 mg in sterile water  20 mL IV syringe  2,000 mg IntraVENous Q24H    azithromycin  (ZITHROMAX ) tablet 500 mg  500 mg Oral Q24H    oxyCODONE  (ROXICODONE ) immediate release tablet 5 mg  5 mg Oral Q4H PRN    oxyCODONE  (ROXICODONE ) immediate release tablet 10 mg  10 mg Oral Q4H PRN    iopamidol  (ISOVUE -370) 76 % injection 100 mL  100 mL IntraVENous ONCE PRN    [Held by provider] apixaban  (ELIQUIS ) tablet 5 mg  5 mg Oral BID    escitalopram  (LEXAPRO ) tablet 5 mg  5 mg Oral Daily    sertraline  (ZOLOFT ) tablet 100 mg  100 mg Oral Daily    sodium chloride  flush 0.9 % injection 5-40 mL  5-40 mL IntraVENous 2 times per day    sodium chloride  flush 0.9 % injection 5-40 mL  5-40 mL IntraVENous PRN  0.9 % sodium chloride  infusion   IntraVENous PRN    potassium chloride  (KLOR-CON ) extended release tablet 40 mEq  40 mEq Oral PRN    Or    potassium bicarb-citric acid  (EFFER-K) effervescent tablet 40 mEq  40 mEq Oral PRN    Or    potassium chloride  10 mEq/100 mL IVPB (Peripheral Line)  10 mEq IntraVENous PRN    magnesium  sulfate 2000 mg in 50 mL IVPB premix  2,000 mg IntraVENous PRN    ondansetron  (ZOFRAN -ODT) disintegrating tablet 4 mg  4 mg Oral Q8H PRN    Or    ondansetron  (ZOFRAN ) injection 4 mg  4 mg IntraVENous Q6H PRN    polyethylene glycol (GLYCOLAX ) packet 17 g  17 g Oral Daily PRN    pantoprazole  (PROTONIX ) 40 mg in sodium chloride  (PF) 0.9 % 10 mL injection  40 mg IntraVENous Daily    metoprolol  tartrate (LOPRESSOR ) tablet 12.5 mg  12.5 mg Oral BID    ipratropium 0.5 mg-albuterol  2.5 mg (DUONEB ) nebulizer solution 1 Dose  1 Dose Inhalation Q6H PRN          Lab Review:     Recent Labs     04/14/24  1313 04/15/24  0425 04/16/24  0648   WBC 9.0 13.1* 12.2*   HGB 13.1 13.4 12.1   HCT 41.4 43.3 39.6   PLT 116* 157 113*     Recent Labs     04/14/24  1313 04/15/24  0425 04/16/24  0648   NA  140 137 134*   K 3.6 4.9 4.2   CL 98 97* 95*   CO2 31* 28 28   BUN 16 20 30*   ALT 27 34  --      No results found for: GLUCPOC       Assessment / Plan:     Sepsis 2/2 pneumonia: POA. SIRS 2/4 (WBC 13.1, temp 100.8). CT abd neg for infection. CT chest reveals PNA.  Improving  - Continue azithromycin  and ceftriaxone   - too late to collect BCx  - No true oxygen demand though she does need to be weaned off of the oxygen that was started    Abdominal pain/elevated lipase/diarrhea: POA. Abdominal exam did show tenderness upper abdominal area but that is now resolved. .  I do think pancreatitis is less likely now because she was able to eat a full meal with no pain or nausea which would be too quick of the recovery for acute pancreatitis.  I agree with the HIDA scan ordered by GI.  - Follow-up HIDA scan  - EGD tomorrow if HIDA is negative  -Would be reasonable to proceed with ERCP and possible lap chole if HIDA is positive  - Patient ideally would prefer to have a lap chole in the outpatient setting rather than this hospitalization if indeed it is indicated.     Paroxysmal afib and HTN: POA. Chronic. Rate controlled  - Continue metoprolol   - Hold Eliquis  for possible EGD tomorrow     COPD: POA. Not in exacerbation.  - DuoNeb  as needed     Cr bump: Maybe 2/2 IVVD from pancreatitis  - IVF and monitor  -This is almost resolved     Liver cirrhosis: POA.  - Advised to follow-up with hepatologist as an outpatient     Thrombocytopenia: POA. Mild.  Chronic  - Outpatient follow    IDDM: POA. Chronic. A1c above goal  - Hold home meds  - SSI for now  -  will need to escalate home regimen at discharge    Morbid Obesity: POA. Chronic. At higher risk of complications of hospitalization. Body mass index is 49.38 kg/m.  - Outpatient diet and lifestyle changes for weight loss  - Recommend OSA testing  - Consider GLP1    In caring for this patient today, I reviewed recent notes, interpreted lab results and imaging tests, had a  face-to-face encounter with the patient, performing the medically necessary appropriate exam and history, counseled the patient/family/caregiver, ordered medications, tests and procedures as indicated for diagnostic and therapeutic purposes, consulted and communicated with other healthcare professionals including care coordination and discharge planning, and documented clinical information in the electronic health record.                 Care Plan discussed with: Patient, Care Manager, and Nursing Staff    Discussed:  Care Plan and D/C Planning    Prophylaxis:  eliquis     Disposition:  Home w/Family           ___________________________________________________    Attending Physician: Othella Crock, MD

## 2024-04-16 NOTE — Progress Notes (Signed)
 Breanna Lester, ACNP-BC  [phone] office  [phone] voicemail     Gastroenterology Progress Note    April 16, 2024  Admit Date: 04/14/2024         Narrative Assessment and Plan   63 year old female. To the hospital with postprandial upper abdominal pain, nausea, dark stools, recent COPD exacerbation, treated with a course of prednisone . Vitals notable for low-grade fever, mild hypoxia responsive to low-flow O2 nasal cannula, clinical exam nonfocal, labs with unconcerning electrolytes, renal chemistries, minimal AST elevation, elevated lipase, normal ALT, alk phos minimally elevated, T. bili normal, leukocytosis WBC 13K, normal hemogram, platelet count negative urinalysis. Recent clear abdominal ultrasound 6 days ago with cirrhotic morphology liver, gallbladder with stones but no inflammatory change, normal-appearing CBD. CT abdomen pelvis noncontrast yesterday with cirrhotic features, splenomegaly, mild ascites, gallstones but otherwise normal-appearing gallbladder.     8/27 - Better today in regards to pain, nausea. No diarrhea, no stool studies collected. HIDA done, GBEF 0%. Received pain meds prior.     Recommend:  Low Na diet today  Continue to trend daily MELD labs and f/u serologies  NPO after midnight for EGD tomorrow to exclude peptic etiology and varices before consulting surgery for biliary dyskinesia    Subjective:   Abdominal pain improving, controlled with pain meds. Nause improved, no vomiting overnight or this morning. No further diarrhea. Is hungry. Had HIDA this morning.    ROS:  The previous review of systems on initial consultation / H&P is noted and reviewed.  Specific changes noted above in HPI.    Current Medications:     Current Facility-Administered Medications   Medication Dose Route Frequency    HYDROmorphone  HCl PF (DILAUDID ) injection 1 mg  1 mg IntraVENous Q4H PRN    cefTRIAXone  (ROCEPHIN ) 2,000 mg in sterile water  20 mL IV syringe  2,000 mg IntraVENous  Q24H    azithromycin  (ZITHROMAX ) tablet 500 mg  500 mg Oral Q24H    oxyCODONE  (ROXICODONE ) immediate release tablet 5 mg  5 mg Oral Q4H PRN    oxyCODONE  (ROXICODONE ) immediate release tablet 10 mg  10 mg Oral Q4H PRN    iopamidol  (ISOVUE -370) 76 % injection 100 mL  100 mL IntraVENous ONCE PRN    apixaban  (ELIQUIS ) tablet 5 mg  5 mg Oral BID    escitalopram  (LEXAPRO ) tablet 5 mg  5 mg Oral Daily    sertraline  (ZOLOFT ) tablet 100 mg  100 mg Oral Daily    sodium chloride  flush 0.9 % injection 5-40 mL  5-40 mL IntraVENous 2 times per day    sodium chloride  flush 0.9 % injection 5-40 mL  5-40 mL IntraVENous PRN    0.9 % sodium chloride  infusion   IntraVENous PRN    potassium chloride  (KLOR-CON ) extended release tablet 40 mEq  40 mEq Oral PRN    Or    potassium bicarb-citric acid  (EFFER-K) effervescent tablet 40 mEq  40 mEq Oral PRN    Or    potassium chloride  10 mEq/100 mL IVPB (Peripheral Line)  10 mEq IntraVENous PRN    magnesium  sulfate 2000 mg in 50 mL IVPB premix  2,000 mg IntraVENous PRN    ondansetron  (ZOFRAN -ODT) disintegrating tablet 4 mg  4 mg Oral Q8H PRN    Or    ondansetron  (ZOFRAN ) injection 4 mg  4 mg IntraVENous Q6H PRN    polyethylene glycol (GLYCOLAX ) packet 17 g  17 g Oral Daily PRN    pantoprazole  (PROTONIX ) 40 mg in sodium chloride  (  PF) 0.9 % 10 mL injection  40 mg IntraVENous Daily    metoprolol  tartrate (LOPRESSOR ) tablet 12.5 mg  12.5 mg Oral BID    ipratropium 0.5 mg-albuterol  2.5 mg (DUONEB ) nebulizer solution 1 Dose  1 Dose Inhalation Q6H PRN       Objective:     VITALS:   Last 24hrs VS reviewed since prior progress note. Most recent are:  Vitals:    04/16/24 0352   BP: 132/60   Pulse: 72   Resp: 16   Temp: 98.4 F (36.9 C)   SpO2: 98%     Temp (24hrs), Avg:98.6 F (37 C), Min:98.4 F (36.9 C), Max:98.8 F (37.1 C)      Intake/Output Summary (Last 24 hours) at 04/16/2024 1133  Last data filed at 04/15/2024 2041  Gross per 24 hour   Intake 0 ml   Output --   Net 0 ml       EXAM:  General:   Comfortable, no distress, morbidly obese  HEENT:  Atraumatic skull, pupils equal, no icterus  Lungs:   No abnormal audible breath sounds.  Speaking in complete sentences  Heart:   No abnormal audible heart sounds.  Well perfused  Abdomen:  Non-distended, non-tender.  No mass, guarding or rebound  Musc:   No skeletal defects or deformities  Neurologic:   Cranial nerves grossly intact, moves all 4 extremities, no asterxis  Psych:    Good insight. Not anxious nor agitated  Derm:   No rash, jaundice, nor palpable dermatologic mass    Lab Data Reviewed:   Recent Labs     04/14/24  1313 04/15/24  0425 04/16/24  0648   WBC 9.0 13.1* 12.2*   HGB 13.1 13.4 12.1   HCT 41.4 43.3 39.6   PLT 116* 157 113*     Recent Labs     04/14/24  1313 04/15/24  0425 04/16/24  0648   NA 140 137 134*   K 3.6 4.9 4.2   CL 98 97* 95*   CO2 31* 28 28   BUN 16 20 30*   ALT 27 34  --      No results found for: GLUCPOC  No results for input(s): PH, PCO2, PO2, HCO3, FIO2 in the last 72 hours.  No results for input(s): INR in the last 72 hours.        Assessment:   (See above)  Principal Problem:    Abdominal pain  Active Problems:    Thrombocytopenia    Cholelithiasis    PAF (paroxysmal atrial fibrillation) (HCC)    COPD (chronic obstructive pulmonary disease) (HCC)    Type 2 diabetes mellitus with hyperglycemia, without long-term current use of insulin  (HCC)    Liver cirrhosis (HCC)    CAD (coronary artery disease)    HTN (hypertension)    Pancreatitis  Resolved Problems:    * No resolved hospital problems. *      Plan:   (See above)      Signed By: Breanna Marcus, APRN - NP     04/16/2024  11:33 AM

## 2024-04-16 NOTE — Care Coordination-Inpatient (Addendum)
 Care Management Progress Note    Reason for Admission:   Abdominal pain [R10.9]  Acute pancreatitis without infection or necrosis, unspecified pancreatitis type [K85.90]  Procedure(s) (LRB):  ESOPHAGOGASTRODUODENOSCOPY (N/A)       Patient Admission Status: Inpatient  RUR:  20%  Hospitalization in the last 30 days (Readmission):  Yes        READMISSION:  Pt was admitted to [facility] from 8/19 - 8/22 and she was readmitted on 8/25.  Reportedly, pt resides at the [facility] in [place] located at [address] [place], [place]  [address] (room [address]).  Her daughter has autism and she stays in the motel as well.     Transition Plan of Care:  Ortho, GI following for medical management  Plan is for pt to return to [facility]  [employer] provides home oxygen concentrator and portable tank (ordered at previous admission).  Pt didn't bring her portable to the hospital.  Outpatient follow up  Pt may require w/c van with oxygen at discharge      CM will continue to follow pt until discharged.  D.Ean Gettel

## 2024-04-17 LAB — BASIC METABOLIC PANEL
Anion Gap: 11 mmol/L (ref 2–14)
Anion Gap: 11 mmol/L (ref 2–14)
BUN/Creatinine Ratio: 29 — ABNORMAL HIGH (ref 12–20)
BUN/Creatinine Ratio: 29 — ABNORMAL HIGH (ref 12–20)
BUN: 30 mg/dL — ABNORMAL HIGH (ref 8–23)
BUN: 32 mg/dL — ABNORMAL HIGH (ref 8–23)
CO2: 28 mmol/L (ref 20–29)
CO2: 30 mmol/L — ABNORMAL HIGH (ref 20–29)
Calcium: 8.6 mg/dL — ABNORMAL LOW (ref 8.8–10.2)
Calcium: 8.9 mg/dL (ref 8.8–10.2)
Chloride: 95 mmol/L — ABNORMAL LOW (ref 98–107)
Chloride: 94 mmol/L — ABNORMAL LOW (ref 98–107)
Creatinine: 1.05 mg/dL — ABNORMAL HIGH (ref 0.60–1.00)
Creatinine: 1.1 mg/dL — ABNORMAL HIGH (ref 0.60–1.00)
Est, Glom Filt Rate: 60 ml/min/1.73m2 (ref >59)
Est, Glom Filt Rate: 56 ml/min/1.73m2 — ABNORMAL LOW (ref >59)
Glucose: 142 mg/dL — ABNORMAL HIGH (ref 65–100)
Glucose: 157 mg/dL — ABNORMAL HIGH (ref 65–100)
Potassium: 4.2 mmol/L (ref 3.5–5.1)
Potassium: 4.8 mmol/L (ref 3.5–5.1)
Sodium: 134 mmol/L — ABNORMAL LOW (ref 136–145)
Sodium: 135 mmol/L — ABNORMAL LOW (ref 136–145)

## 2024-04-17 LAB — CBC
Hematocrit: 40.5 % (ref 35.0–47.0)
Hemoglobin: 12.4 g/dL (ref 11.5–16.0)
MCH: 27.6 pg (ref 26.0–34.0)
MCHC: 30.6 g/dL (ref 30.0–36.5)
MCV: 90.2 FL (ref 80.0–99.0)
MPV: 11.8 FL (ref 8.9–12.9)
Nucleated RBCs: 0 /100{WBCs}
Platelets: 159 K/uL (ref 150–400)
RBC: 4.49 M/uL (ref 3.80–5.20)
RDW: 14.5 % (ref 11.5–14.5)
WBC: 15 K/uL — ABNORMAL HIGH (ref 3.6–11.0)
nRBC: 0 K/uL (ref 0.00–0.01)

## 2024-04-17 LAB — ANTI-SMOOTH MUSCLE ANTIBODY: Smooth Muscle Ab: 9 U (ref 0–19)

## 2024-04-17 LAB — AFP TUMOR MARKER: AFP-Tumor Marker: 3.4 ng/mL (ref 0.0–9.2)

## 2024-04-17 LAB — CERULOPLASMIN: Ceruloplasmin: 37.4 mg/dL (ref 19.0–39.0)

## 2024-04-17 LAB — MITOCHONDRIAL ANTIBODIES, M2: Mitochondrial M2 Ab, IgG: <20 U (ref 0.0–20.0)

## 2024-04-17 LAB — ALPHA-1-ANTITRYPSIN: A-1 Antitrypsin: 203 mg/dL — ABNORMAL HIGH (ref 101–187)

## 2024-04-17 MED ORDER — LIDOCAINE HCL (PF) 2 % IJ SOLN
2 | Freq: Once | INTRAMUSCULAR | Status: DC | PRN
Start: 2024-04-17 — End: 2024-04-17
  Administered 2024-04-17: 12:00:00 100 via INTRAVENOUS

## 2024-04-17 MED ORDER — LIDOCAINE HCL (PF) 2 % IJ SOLN
2 | INTRAMUSCULAR | Status: AC
Start: 2024-04-17 — End: 2024-04-17

## 2024-04-17 MED ORDER — NORMAL SALINE FLUSH 0.9 % IV SOLN
0.9 | INTRAVENOUS | Status: DC | PRN
Start: 2024-04-17 — End: 2024-04-17

## 2024-04-17 MED ORDER — PHENYLEPHRINE HCL (PRESSORS) 0.4 MG/10ML IV SOSY
0.4 | INTRAVENOUS | Status: AC
Start: 2024-04-17 — End: 2024-04-17

## 2024-04-17 MED ORDER — PROPOFOL 500 MG/50ML IV EMUL
500 | Freq: Once | INTRAVENOUS | Status: DC | PRN
Start: 2024-04-17 — End: 2024-04-17
  Administered 2024-04-17: 12:00:00 60 via INTRAVENOUS
  Administered 2024-04-17: 12:00:00 40 via INTRAVENOUS

## 2024-04-17 MED ORDER — PROPOFOL 500 MG/50ML IV EMUL
500 | INTRAVENOUS | Status: AC
Start: 2024-04-17 — End: 2024-04-17

## 2024-04-17 MED ORDER — SODIUM CHLORIDE 0.9 % IV SOLN
0.9 | INTRAVENOUS | Status: DC | PRN
Start: 2024-04-17 — End: 2024-04-17

## 2024-04-17 MED ORDER — AZITHROMYCIN 500 MG PO TABS
500 | ORAL_TABLET | ORAL | 0 refills | 5.00000 days | Status: AC
Start: 2024-04-17 — End: 2024-04-20

## 2024-04-17 MED ORDER — ONDANSETRON 4 MG PO TBDP
4 | ORAL_TABLET | Freq: Three times a day (TID) | ORAL | 0 refills | 7.00000 days | Status: DC | PRN
Start: 2024-04-17 — End: 2024-07-06

## 2024-04-17 MED ORDER — NORMAL SALINE FLUSH 0.9 % IV SOLN
0.9 | Freq: Two times a day (BID) | INTRAVENOUS | Status: DC
Start: 2024-04-17 — End: 2024-04-17

## 2024-04-17 MED ORDER — METFORMIN HCL ER 500 MG PO TB24
500 | ORAL_TABLET | Freq: Every day | ORAL | 1 refills | 30.00000 days | Status: DC
Start: 2024-04-17 — End: 2024-04-25

## 2024-04-17 MED ORDER — OXYCODONE HCL 5 MG PO TABS
5 | ORAL_TABLET | Freq: Four times a day (QID) | ORAL | 0 refills | 5.00000 days | Status: AC | PRN
Start: 2024-04-17 — End: 2024-04-20

## 2024-04-17 MED ORDER — AMOXICILLIN 500 MG PO CAPS
500 | ORAL_CAPSULE | Freq: Three times a day (TID) | ORAL | 0 refills | 7.00000 days | Status: AC
Start: 2024-04-17 — End: 2024-04-21

## 2024-04-17 MED ORDER — PANTOPRAZOLE SODIUM 40 MG PO TBEC
40 | ORAL_TABLET | Freq: Every day | ORAL | 0 refills | 30.00000 days | Status: DC
Start: 2024-04-17 — End: 2024-05-09

## 2024-04-17 MED FILL — ONDANSETRON HCL 4 MG/2ML IJ SOLN: 4 MG/2ML | INTRAMUSCULAR | Qty: 2

## 2024-04-17 MED FILL — PANTOPRAZOLE SODIUM 40 MG IV SOLR: 40 mg | INTRAVENOUS | Qty: 40

## 2024-04-17 MED FILL — PHENYLEPHRINE HCL (PRESSORS) 0.4 MG/10ML IV SOSY: 0.4 MG/10ML | INTRAVENOUS | Qty: 10

## 2024-04-17 MED FILL — METOPROLOL TARTRATE 25 MG PO TABS: 25 mg | ORAL | Qty: 1

## 2024-04-17 MED FILL — DIPRIVAN 500 MG/50ML IV EMUL: 500 MG/50ML | INTRAVENOUS | Qty: 50

## 2024-04-17 MED FILL — CEFTRIAXONE SODIUM 2 G IJ SOLR: 2 g | INTRAMUSCULAR | Qty: 2000

## 2024-04-17 MED FILL — ESCITALOPRAM OXALATE 10 MG PO TABS: 10 mg | ORAL | Qty: 1

## 2024-04-17 MED FILL — OXYCODONE HCL 5 MG PO TABS: 5 mg | ORAL | Qty: 2

## 2024-04-17 MED FILL — BD POSIFLUSH 0.9 % IV SOLN: 0.9 % | INTRAVENOUS | Qty: 40

## 2024-04-17 MED FILL — AZITHROMYCIN 250 MG PO TABS: 250 mg | ORAL | Qty: 2

## 2024-04-17 MED FILL — HYDROMORPHONE HCL 1 MG/ML IJ SOLN: 1 mg/mL | INTRAMUSCULAR | Qty: 1

## 2024-04-17 MED FILL — SERTRALINE HCL 50 MG PO TABS: 50 mg | ORAL | Qty: 2

## 2024-04-17 MED FILL — XYLOCAINE-MPF 2 % IJ SOLN: 2 % | INTRAMUSCULAR | Qty: 5

## 2024-04-17 NOTE — Discharge Instructions (Addendum)
 HOSPITALIST DISCHARGE INSTRUCTIONS  NAME:  Breanna Lester   DOB:  [birth year]   MRN:  [id number]     Date/Time:  04/17/2024 11:43 AM    ADMIT DATE: 04/14/2024     DISCHARGE DATE: 04/17/2024     DISCHARGE DIAGNOSIS:  Pneumonia, diarrhea, abdominal pain    DISCHARGE INSTRUCTIONS:  Thank you for allowing us  to participate in your care. Your discharging Hospitalist is Warren LITTIE Olp, MD. Breanna Lester were admitted for evaluation and treatment of the above. You improved with supportive measures and antibiotics. You will follow up with your PCP, GI, and general surgery team on discharge. You will finish a course of antibiotics and take an acid blocker for 8 weeks.      MEDICATIONS:    It is important that you take the medication exactly as they are prescribed.   Keep your medication in the bottles provided by the pharmacist and keep a list of the medication names, dosages, and times to be taken in your wallet.   Do not take other medications without consulting your doctor.             If you experience any of the following symptoms then please call your primary care physician or return to the emergency room if you cannot get hold of your doctor:  Fever, chills, nausea, vomiting, diarrhea, change in mentation, falling, bleeding, shortness of breath    Follow Up:  Please call the below provider to arrange hospital follow up appointment      [place]  [address]  [place] [address]  [phone]    Follow up      Unknown, Provider, NP-C    Schedule an appointment as soon as possible for a visit      Madlyn Fendt, MD  [address]  [place]. [place] [address]  [phone]    Follow up          Information obtained by :  I understand that if any problems occur once I am at home I am to contact my physician.    I understand and acknowledge receipt of the instructions indicated above.                                                                                                                                            Physician's or R.N.'s Signature                                                                  Date/Time  Patient or Representative Signature                                                          Date/Time    My Sepsis Discharge Action Plan  You have just been admitted and treated for sepsis, which is the product of severe infection. You received antibiotics for treatment, but are still recovering. Sometimes you can get worse after leaving the hospital despite all of our medical treatments. For that reason, it is important to have a plan in place to know what to do if you start feeling worse.  This is called your Sepsis Discharge Action Plan.       Danger Zone  How I feel:  My heartbeat or breathing is very fast  I have a fever (100.5*F or higher)  My temperature is abnormally low (below 96.8*F)  My skin is pale or nails are blue  I have a new rash, or my area of open skin is worse, more painful, smelly, or oozing  I am very tired and cannot do my daily activities  I am confused or my caregivers tell me I am not making sense  I feel sick  What to do:  Call 911 NOW  ______________________________________________________________________      Caution Zone  How I feel:  My heartbeat or breathing is faster than normal  I have a slight fever (100*F - 100.4*F)  I have chills/shivering  I am tired and it is difficult to do my normal activities  My thinking is slow  I do not feel well  My initial symptoms are worse (cough, area of open skin, diarrhea, etc.)  What to do:  Take daily medications  Contact my Primary Care office for an Urgent Visit or advice or call [facility]  Unknown, Provider  {No department listed for PCP}   [facility] [phone]  *If you do not have a Primary Care Physician or are out of the service area for [facility], then go to the closest Emergency Department  ______________________________________________________________________    Clear Zone  How I feel:  My heartbeat and breathing are normal for me  I do not have a fever  I do not feel hot or cold  My thinking is clear  I feel well and my energy level is normal  Any areas of open skin I had are improving or gone  What to do:  You're doing great. You're on the path to recovery  Take daily medications  Eat healthy and slowly build up activity  Attend all follow up appointments as scheduled

## 2024-04-17 NOTE — Discharge Summary (Signed)
 Hospitalist Discharge Summary     Patient ID:  Breanna Lester  [id number]  63 y.o.  02/09/1961    Admit date: 04/14/2024    Discharge date and time: 04/17/2024    Admission Diagnoses: Abdominal pain [R10.9]  Acute pancreatitis without infection or necrosis, unspecified pancreatitis type [K85.90]    Discharge Diagnoses:    Principal Problem:    Sepsis (HCC)  Active Problems:    Thrombocytopenia    Cholelithiasis    PAF (paroxysmal atrial fibrillation) (HCC)    COPD (chronic obstructive pulmonary disease) (HCC)    Type 2 diabetes mellitus with hyperglycemia, without long-term current use of insulin  (HCC)    Abdominal pain    Liver cirrhosis (HCC)    CAD (coronary artery disease)    HTN (hypertension)    Pancreatitis  Resolved Problems:    * No resolved hospital problems. *         Hospital Course:   Sepsis 2/2 pneumonia: POA. SIRS 2/4 (WBC 13.1, temp 100.8). CT abd neg for infection. CT chest reveals PNA.  Improving  - s/p ceftriaxone , continue azithro on discharge and switch CTX to amoxicillin to complete course  - too late to collect BCx  - No true oxygen demand though she does need to be weaned off of the oxygen that was started     Abdominal pain/elevated lipase/diarrhea: POA. Abdominal exam did show tenderness upper abdominal area but that is now resolved. I do think pancreatitis is less likely now because she was able to eat a full meal with no pain or nausea which would be too quick of the recovery for acute pancreatitis.  HIDA scan neg. EGD with diffuse edamtous erythematous gastropathy, scalloped proximal small intestine.  - Follow-up with GI outpatient, follow H. pylori and celiac testing  -Discharged on Protonix  40 mg daily x 8 weeks  -Repeat CT abdomen pelvis with IV and oral contrast scan in 4 to 6 weeks   -Follow-up with general surgery outpatient with regard to possible cholecystectomy after pneumonia resolves  -As needed oxycodone  for pain     Paroxysmal afib and HTN: POA. Chronic. Rate controlled  -  Continue metoprolol , Eliquis      COPD: POA. Not in exacerbation.  - Continue home inhalers     Cr bump: Maybe 2/2 IVVD from pancreatitis  - monitor outpatient  -This is stable     Liver cirrhosis: POA.  - Advised to follow-up with hepatologist as an outpatient     Thrombocytopenia: POA. Mild.  Chronic  - Outpatient follow up     IDDM: POA. Chronic. A1c 8.1%, above goal.  Was not on any meds at home  - Will discharge with metformin, PCP follow-up     Morbid Obesity: POA. Chronic. At higher risk of complications of hospitalization. Body mass index is 49.38 kg/m.  - Outpatient diet and lifestyle changes for weight loss  - Recommend OSA testing  - Consider GLP1    Imaging  NM HEPATOBILIARY SCAN W PHARMACOLOGICAL INTERVENTION  Result Date: 04/16/2024  No identifiable gallbladder emptying either quantitatively or qualitatively. Electronically signed by DARICE COLON    CT CHEST WO CONTRAST  Result Date: 04/15/2024  1. New patchy right upper lobe groundglass opacities likely infectious/inflammatory. Electronically signed by Aletha CHRISTELLA Ham    CT ABDOMEN PELVIS WO CONTRAST Additional Contrast? None  Result Date: 04/14/2024  Cirrhosis with splenomegaly and mild ascites Incidental cholelithiasis, periumbilical hernia, treated L2 fracture Secondary to an inflammatory or infectious process or carcinomatosis. It has  qualities of both Normal CT appearance of the pancreas at this time Electronically signed by Devere Cal       PCP: Unknown, Provider, NP-C     Consults: GI, orthopedic surgery, and endocrinology    Condition of patient at discharge: Improved and Stable    Discharge Exam:    Physical Exam:    General: No acute distress. Alert. Cooperative. Morbid obesity  Head: Normocephalic. Atraumatic.  Mouth: Mucosa pink. Moist mucous membranes.   Respiratory: CTAB. No increased work of breathing. Symmetric chest rise b/l.  Cardiovascular: RRR. No clubbing or cyanosis  GI: nondistended, mild TTP in epigastric region, +BS. No masses.    Extremities: no LE edema. Moving all extremities spontaneously.  Musculoskeletal: Full ROM in all extremities, no obvious deformities. +peripheral pulses.  Skin: Warm, dry. No rashes.   Neuro: Alert, normal behavior. No focal deficit.       Disposition: home    Patient Instructions:   Current Discharge Medication List        START taking these medications    Details   oxyCODONE  (ROXICODONE ) 5 MG immediate release tablet Take 1 tablet by mouth every 6 hours as needed for Pain for up to 3 days. Max Daily Amount: 20 mg  Qty: 10 tablet, Refills: 0  Start date: 04/17/2024, End date: 04/20/2024    Comments: Reduce doses taken as pain becomes manageable  Associated Diagnoses: Acute pancreatitis without infection or necrosis, unspecified pancreatitis type; Epigastric pain      ondansetron  (ZOFRAN -ODT) 4 MG disintegrating tablet Take 1 tablet by mouth every 8 hours as needed for Nausea or Vomiting  Qty: 30 tablet, Refills: 0  Start date: 04/17/2024      azithromycin  (ZITHROMAX ) 500 MG tablet Take 1 tablet by mouth in the morning for 3 doses.  Qty: 3 tablet, Refills: 0  Start date: 04/17/2024, End date: 04/20/2024      pantoprazole  (PROTONIX ) 40 MG tablet Take 1 tablet by mouth every morning (before breakfast)  Qty: 60 tablet, Refills: 0  Start date: 04/17/2024      amoxicillin (AMOXIL) 500 MG capsule Take 2 capsules by mouth 3 times daily for 4 days  Qty: 24 capsule, Refills: 0  Start date: 04/17/2024, End date: 04/21/2024      metFORMIN (GLUCOPHAGE-XR) 500 MG extended release tablet Take 1 tablet by mouth daily (with breakfast)  Qty: 90 tablet, Refills: 1  Start date: 04/17/2024           CONTINUE these medications which have NOT CHANGED    Details   metoprolol  tartrate (LOPRESSOR ) 25 MG tablet Take 0.5 tablets by mouth 2 times daily  Qty: 30 tablet, Refills: 1      albuterol  sulfate HFA (VENTOLIN  HFA) 108 (90 Base) MCG/ACT inhaler Inhale 2 puffs into the lungs 4 times daily as needed for Wheezing  Qty: 18 g, Refills: 0      blood  glucose monitor strips Test 1-2 times a day & as needed for symptoms of irregular blood glucose. Dispense sufficient amount for indicated testing frequency plus additional to accommodate PRN testing needs. Pharmacist to identify preferred brand.  Qty: 100 strip, Refills: 2      chlorthalidone  (HYGROTON ) 25 MG tablet Take 1 tablet by mouth daily      sertraline  (ZOLOFT ) 100 MG tablet Take 1 tablet by mouth daily      apixaban  (ELIQUIS ) 5 MG TABS tablet Take 1 tablet by mouth 2 times daily      escitalopram  (LEXAPRO ) 5 MG  tablet Take 1 tablet by mouth daily      Blood Glucose Monitoring Suppl (BLOOD GLUCOSE MONITOR SYSTEM) w/Device KIT Pharmacist to identify preferred meter and strips.  Qty: 1 kit, Refills: 0      Lancets 30G MISC Test 1-2 times a day & as needed for symptoms of irregular blood glucose. Dispense sufficient amount for indicated testing frequency plus additional to accommodate PRN testing needs. Pharmacist to identify preferred brand.  Qty: 100 each, Refills: 2      acetaminophen  (TYLENOL ) 325 MG tablet Take 1 tablet by mouth every 6 hours as needed for Pain  Qty: 40 tablet, Refills: 0           STOP taking these medications       predniSONE  (DELTASONE ) 20 MG tablet Comments:   Reason for Stopping:             Activity: activity as tolerated  Diet: gi bland, low fiber  Wound Care: none needed    Follow-up with Unknown, Provider, NP-C in 1 week.  Follow-up tests/labs - h pylori and celiac testing, blood glucoses    Approximate time spent in patient care on day of discharge: 45 minutes    Signed:  Warren LITTIE Olp, MD  04/17/2024  12:07 PM

## 2024-04-17 NOTE — Anesthesia Pre-Procedure Evaluation (Signed)
 Department of Anesthesiology  Preprocedure Note       Name:  Breanna Lester   Age:  63 y.o.  DOB:  14-Jun-1961                                          MRN:  [id number]         Date:  04/17/2024      Surgeon: Clotilde):  Madlyn Fendt, MD    Procedure: Procedure(s):  ESOPHAGOGASTRODUODENOSCOPY    Medications prior to admission:   Prior to Admission medications   Medication Sig Start Date End Date Taking? Authorizing Provider   albuterol  sulfate HFA (VENTOLIN  HFA) 108 (90 Base) MCG/ACT inhaler Inhale 2 puffs into the lungs 4 times daily as needed for Wheezing 04/11/24 06/10/24 Yes Bertell Bathe, MD   chlorthalidone  (HYGROTON ) 25 MG tablet Take 1 tablet by mouth daily   Yes [provider]   sertraline  (ZOLOFT ) 100 MG tablet Take 1 tablet by mouth daily   Yes [provider]   apixaban  (ELIQUIS ) 5 MG TABS tablet Take 1 tablet by mouth 2 times daily   Yes [provider]   escitalopram  (LEXAPRO ) 5 MG tablet Take 1 tablet by mouth daily   Yes [provider]   sertraline  (ZOLOFT ) 50 MG tablet Take 1 tablet by mouth nightly   Yes [provider]   metoprolol  tartrate (LOPRESSOR ) 25 MG tablet Take 0.5 tablets by mouth 2 times daily  Patient not taking: Reported on 04/14/2024 04/11/24 06/10/24  Bertell Bathe, MD   Blood Glucose Monitoring Suppl (BLOOD GLUCOSE MONITOR SYSTEM) w/Device KIT Pharmacist to identify preferred meter and strips.  Patient not taking: Reported on 04/14/2024 04/11/24   Alfrieda Klinefelter A, APRN - CNS   blood glucose monitor strips Test 1-2 times a day & as needed for symptoms of irregular blood glucose. Dispense sufficient amount for indicated testing frequency plus additional to accommodate PRN testing needs. Pharmacist to identify preferred brand.  Patient not taking: Reported on 04/14/2024 04/11/24   Alfrieda Klinefelter LABOR, APRN - CNS   Lancets 30G MISC Test 1-2 times a day & as needed for symptoms of irregular blood glucose. Dispense sufficient amount for indicated testing  frequency plus additional to accommodate PRN testing needs. Pharmacist to identify preferred brand.  Patient not taking: Reported on 04/14/2024 04/11/24   Alfrieda Klinefelter LABOR, APRN - CNS   acetaminophen  (TYLENOL ) 325 MG tablet Take 1 tablet by mouth every 6 hours as needed for Pain 02/26/24 03/07/24  Cutchins, Morene DASEN, PA-C       Current medications:    Current Facility-Administered Medications   Medication Dose Route Frequency Provider Last Rate Last Admin    HYDROmorphone  HCl PF (DILAUDID ) injection 1 mg  1 mg IntraVENous Q4H PRN Aktig, Ziya, MD   1 mg at 04/17/24 0236    cefTRIAXone  (ROCEPHIN ) 2,000 mg in sterile water  20 mL IV syringe  2,000 mg IntraVENous Q24H Aktig, Ziya, MD   2,000 mg at 04/16/24 0805    azithromycin  (ZITHROMAX ) tablet 500 mg  500 mg Oral Q24H Aktig, Ziya, MD   500 mg at 04/16/24 1324    oxyCODONE  (ROXICODONE ) immediate release tablet 5 mg  5 mg Oral Q4H PRN Aktig, Ziya, MD        oxyCODONE  (ROXICODONE ) immediate release tablet 10 mg  10 mg Oral Q4H PRN Aktig, Ziya, MD  10 mg at 04/16/24 2043    iopamidol  (ISOVUE -370) 76 % injection 100 mL  100 mL IntraVENous ONCE PRN Marvis Charlie SAUNDERS, MD        [Held by provider] apixaban  (ELIQUIS ) tablet 5 mg  5 mg Oral BID Tefera, Mesfin A, MD   5 mg at 04/16/24 0759    escitalopram  (LEXAPRO ) tablet 5 mg  5 mg Oral Daily Tefera, Mesfin A, MD   5 mg at 04/16/24 0800    sertraline  (ZOLOFT ) tablet 100 mg  100 mg Oral Daily Tefera, Mesfin A, MD   100 mg at 04/16/24 0800    sodium chloride  flush 0.9 % injection 5-40 mL  5-40 mL IntraVENous 2 times per day Tefera, Mesfin A, MD   10 mL at 04/16/24 2046    sodium chloride  flush 0.9 % injection 5-40 mL  5-40 mL IntraVENous PRN Tefera, Mesfin A, MD   10 mL at 04/17/24 0611    0.9 % sodium chloride  infusion   IntraVENous PRN Tefera, Mesfin A, MD        potassium chloride  (KLOR-CON ) extended release tablet 40 mEq  40 mEq Oral PRN Tefera, Mesfin A, MD        Or    potassium bicarb-citric acid  (EFFER-K) effervescent  tablet 40 mEq  40 mEq Oral PRN Tefera, Mesfin A, MD        Or    potassium chloride  10 mEq/100 mL IVPB (Peripheral Line)  10 mEq IntraVENous PRN Tefera, Mesfin A, MD        magnesium  sulfate 2000 mg in 50 mL IVPB premix  2,000 mg IntraVENous PRN Tefera, Mesfin A, MD        ondansetron  (ZOFRAN -ODT) disintegrating tablet 4 mg  4 mg Oral Q8H PRN Tefera, Mesfin A, MD        Or    ondansetron  (ZOFRAN ) injection 4 mg  4 mg IntraVENous Q6H PRN Tefera, Mesfin A, MD   4 mg at 04/17/24 0611    polyethylene glycol (GLYCOLAX ) packet 17 g  17 g Oral Daily PRN Tefera, Mesfin A, MD        pantoprazole  (PROTONIX ) 40 mg in sodium chloride  (PF) 0.9 % 10 mL injection  40 mg IntraVENous Daily Tefera, Mesfin A, MD   40 mg at 04/16/24 0804    metoprolol  tartrate (LOPRESSOR ) tablet 12.5 mg  12.5 mg Oral BID Tefera, Mesfin A, MD   12.5 mg at 04/16/24 2043    ipratropium 0.5 mg-albuterol  2.5 mg (DUONEB ) nebulizer solution 1 Dose  1 Dose Inhalation Q6H PRN Tefera, Mesfin A, MD           Allergies:    Allergies   Allergen Reactions    Latex Rash       Problem List:    Patient Active Problem List   Diagnosis Code    Gait abnormality R26.9    Alcoholic cirrhosis of liver (HCC) K70.30    Thrombocytopenia D69.6    Hypokalemia E87.6    Hypomagnesemia E83.42    Cholelithiasis K80.20    Nausea with vomiting R11.2    Hepatic encephalopathy (HCC) K76.82    PAF (paroxysmal atrial fibrillation) (HCC) I48.0    Acute hypoxic respiratory failure (HCC) J96.01    COPD (chronic obstructive pulmonary disease) (HCC) J44.9    Type 2 diabetes mellitus with hyperglycemia, without long-term current use of insulin  (HCC) E11.65    Abdominal pain R10.9    Liver cirrhosis (HCC) K74.60    CAD (coronary artery disease) I25.10  HTN (hypertension) I10    Pancreatitis K85.90       Past Medical History:        Diagnosis Date    Anxiety     Depression     Gastrointestinal disorder     Heart failure (HCC)     Hyperlipidemia     Hypertension     Liver disease 2009    liver  failure    Pneumonia        Past Surgical History:        Procedure Laterality Date    ADENOIDECTOMY  1972    BACK SURGERY      cement discs following a fall    CATARACT REMOVAL      CESAREAN SECTION  1986 and 1993    DILATION AND CURETTAGE OF UTERUS  1999    EGD TRANSORAL BIOPSY SINGLE/MULTIPLE  05/17/2009         HYSTERECTOMY (CERVIX STATUS UNKNOWN)  2008    TONSILLECTOMY  1972       Social History:    Social History     Tobacco Use    Smoking status: Former     Current packs/day: 0.00     Types: Cigarettes     Quit date: 02/28/2008     Years since quitting: 16.1    Smokeless tobacco: Never   Substance Use Topics    Alcohol use: Never                                Counseling given: Not Answered      Vital Signs (Current):   Vitals:    04/16/24 0352 04/16/24 2033 04/16/24 2043 04/17/24 0236   BP: 132/60 136/77 139/71 102/89   Pulse: 72 65 64 64   Resp: 16 14 16 16    Temp: 98.4 F (36.9 C) 98.6 F (37 C) 98.4 F (36.9 C) 98.4 F (36.9 C)   TempSrc: Oral Oral Oral Oral   SpO2: 98% 98% 96% 100%   Weight:       Height:                                                  BP Readings from Last 3 Encounters:   04/17/24 102/89   04/11/24 (!) 140/107   02/26/24 (!) 157/70       NPO Status: Time of last liquid consumption: 2330                        Time of last solid consumption: 2330                        Date of last liquid consumption: 04/16/24                        Date of last solid food consumption: 04/16/24    BMI:   Wt Readings from Last 3 Encounters:   04/14/24 122.5 kg (270 lb)   04/08/24 124.3 kg (274 lb)   02/26/24 113.4 kg (250 lb)     Body mass index is 49.38 kg/m.    CBC:   Lab Results   Component Value Date/Time    WBC 12.2 04/16/2024 06:48 AM  RBC 4.40 04/16/2024 06:48 AM    HGB 12.1 04/16/2024 06:48 AM    HCT 39.6 04/16/2024 06:48 AM    MCV 90.0 04/16/2024 06:48 AM    RDW 14.3 04/16/2024 06:48 AM    PLT 113 04/16/2024 06:48 AM       CMP:   Lab Results   Component Value Date/Time    NA 134 04/16/2024  06:48 AM    K 4.2 04/16/2024 06:48 AM    CL 95 04/16/2024 06:48 AM    CO2 28 04/16/2024 06:48 AM    BUN 30 04/16/2024 06:48 AM    CREATININE 1.05 04/16/2024 06:48 AM    AGRATIO 0.8 12/07/2021 05:23 PM    LABGLOM 60 04/16/2024 06:48 AM    LABGLOM >60 03/19/2022 11:25 AM    LABGLOM >60 12/07/2021 05:23 PM    GLUCOSE 142 04/16/2024 06:48 AM    CALCIUM 8.6 04/16/2024 06:48 AM    BILITOT 1.0 04/15/2024 04:25 AM    ALKPHOS 114 04/15/2024 04:25 AM    ALKPHOS 147 12/07/2021 05:23 PM    AST 40 04/15/2024 04:25 AM    ALT 34 04/15/2024 04:25 AM       POC Tests: No results for input(s): POCGLU, POCNA, POCK, POCCL, POCBUN, POCHEMO, POCHCT in the last 72 hours.    Coags: No results found for: PROTIME, INR, APTT    HCG (If Applicable): No results found for: PREGTESTUR, PREGSERUM, HCG, HCGQUANT     ABGs: No results found for: PHART, PO2ART, PCO2ART, HCO3ART, BEART, O2SATART     Type & Screen (If Applicable):  Lab Results   Component Value Date    ABORH A POS 11/20/2007    LABANTI NEG 11/20/2007       Drug/Infectious Status (If Applicable):  Lab Results   Component Value Date/Time    HEPCAB NONREACTIVE 04/15/2024 03:15 PM       COVID-19 Screening (If Applicable):   Lab Results   Component Value Date/Time    COVID19 Not detected 04/08/2024 02:17 PM           Anesthesia Evaluation  Patient summary reviewed  Airway: Mallampati: IV  TM distance: >3 FB   Neck ROM: full  Mouth opening: > = 3 FB   Dental:    (+) poor dentition      Pulmonary:   (+)  COPD: moderate and mild,                                     Cardiovascular:    (+) hypertension: mild and moderate, CAD: no interval change, dysrhythmias: PAC and atrial fibrillation, CHF: no interval change      ECG reviewed  Rhythm: irregular  Rate: normal  Echocardiogram reviewed         Beta Blocker:  No, reason not specified      ROS comment: Latest EF- 61%, Normal LV function     Neuro/Psych:               GI/Hepatic/Renal:   (+) liver disease:  hepatic encephalopathy, morbid obesity         ROS comment: Cirrhosis.   Endo/Other:    (+) DiabetesType II DM, poorly controlled.                  ROS comment: pancreatitis Abdominal:   (+) obese          Vascular:  Other Findings: Hysterectomy  Adenoidectomy  Cataract  Back surgery            Anesthesia Plan      MAC     ASA 3       Induction: intravenous.      Anesthetic plan and risks discussed with patient.      Plan discussed with CRNA.                    Anupama Piehl Authur Majors, MD   04/17/2024

## 2024-04-17 NOTE — Progress Notes (Signed)
 TRANSFER - OUT REPORT:    Verbal report given to Lyle, RN on Breanna Lester  being transferred to 5th floor for ordered procedure       Report consisted of patient's Situation, Background, Assessment and   Recommendations(SBAR).     Information from the following report(s) Nurse Handoff Report was reviewed with the receiving nurse.           Lines:   Peripheral IV 04/14/24 Left Antecubital (Active)   Site Assessment Clean, dry & intact 04/17/24 0723   Line Status Infusing 04/17/24 0723   Line Care Cap changed 04/16/24 2043   Phlebitis Assessment No symptoms 04/17/24 0723   Infiltration Assessment 0 04/17/24 0723   Alcohol Cap Used Yes 04/17/24 0723   Dressing Status Clean, dry & intact 04/17/24 0723   Dressing Type Transparent 04/17/24 0723   Dressing Intervention Dressing changed;New 04/14/24 1440        Opportunity for questions and clarification was provided.      Patient transported with:  [employer]

## 2024-04-17 NOTE — Progress Notes (Signed)
 TRANSFER - IN REPORT:    Verbal report received from Teresita on Breanna Lester  being received from [place], [place] for ordered procedure      Report consisted of patient's Situation, Background, Assessment and   Recommendations(SBAR).     Information from the following report(s) Nurse Handoff Report was reviewed with the receiving nurse.    Opportunity for questions and clarification was provided.      Assessment completed upon patient's arrival to unit and care assumed.

## 2024-04-17 NOTE — Progress Notes (Signed)
 04/17/24 1413   G[employer]aging   Group Group B     Pt does not follow with Pulmonary at baseline.  Discussed making follow up Pulm appt after discharge.  Consult placed for [facility] home service.

## 2024-04-17 NOTE — Care Coordination-Inpatient (Addendum)
 Care Management Progress Note    Reason for Admission:   Abdominal pain [R10.9]  Acute pancreatitis without infection or necrosis, unspecified pancreatitis type [K85.90]  Procedure(s) (LRB):  ESOPHAGOGASTRODUODENOSCOPY (N/A)  * Day of Surgery *    Patient Admission Status: Inpatient  RUR:    Hospitalization in the last 30 days (Readmission):  Yes        READMISSION:  Pt was admitted to [facility] from 8/19 - 8/22 and she was readmitted on 8/25.  Reportedly, pt resides at the [facility] in [place] located at [address] [place], [place]  [address] (room [address]).  Her daughter has autism and she stays in the motel as well.     Transition Plan of Care:  Ortho, GI following for medical management - pt is s/p EGD on 8/28  Plan is for pt to return to [facility]  [employer] provides home oxygen concentrator and portable tank (ordered at previous admission).  Pt didn't bring her portable to the hospital.  [facility] referral was sent per order  Outpatient follow up  [facility]ospital [facility] Home w/c fleeta with oxygen was arranged for 4:30 pickup today.  Hospital will be billed $150     No further discharge needs indicated.  D.Kanasia Gayman

## 2024-04-17 NOTE — Progress Notes (Signed)
 Nurse handed patient a copy of discharge instructions which have been read and explained to patient. New medications read and explained. Patient verbalized understanding. Patient aware that prescriptions have been electronically sent to pharmacy. Opportunity for questions and clarification offered. Removed patients IV access with no complications, VS stable, and patient sent with all belongings.

## 2024-04-17 NOTE — H&P (Signed)
 .Pre-Endoscopy H&P Update  Chief complaint/HPI/ROS:  The indication for the procedure, the patient's history and the patient's current medications are reviewed prior to the procedure and that data is reported on the H&P to which this document is attached.  Any significant complaints with regard to organ systems will be noted, and if not mentioned then a review of systems is not contributory.  Past Medical History:   Diagnosis Date    Anxiety     Depression     Gastrointestinal disorder     Heart failure (HCC)     Hyperlipidemia     Hypertension     Liver disease 2009    liver failure    Pneumonia       Past Surgical History:   Procedure Laterality Date    ADENOIDECTOMY  1972    BACK SURGERY      cement discs following a fall    CATARACT REMOVAL      CESAREAN SECTION  1986 and 1993    DILATION AND CURETTAGE OF UTERUS  1999    EGD TRANSORAL BIOPSY SINGLE/MULTIPLE  05/17/2009         HYSTERECTOMY (CERVIX STATUS UNKNOWN)  2008    TONSILLECTOMY  1972     Social   Social History     Tobacco Use    Smoking status: Former     Current packs/day: 0.00     Types: Cigarettes     Quit date: 02/28/2008     Years since quitting: 16.1    Smokeless tobacco: Never   Substance Use Topics    Alcohol use: Never      Family History   Problem Relation Age of Onset    Cancer Mother     Heart Disease Father       Allergies   Allergen Reactions    Latex Rash      Prior to Admission Medications   Prescriptions Last Dose Informant Patient Reported? Taking?   Blood Glucose Monitoring Suppl (BLOOD GLUCOSE MONITOR SYSTEM) w/Device KIT Not Taking  No No   Sig: Pharmacist to identify preferred meter and strips.   Patient not taking: Reported on 04/14/2024   Lancets 30G MISC Not Taking  No No   Sig: Test 1-2 times a day & as needed for symptoms of irregular blood glucose. Dispense sufficient amount for indicated testing frequency plus additional to accommodate PRN testing needs. Pharmacist to identify preferred brand.   Patient not taking: Reported on  04/14/2024   acetaminophen  (TYLENOL ) 325 MG tablet   No No   Sig: Take 1 tablet by mouth every 6 hours as needed for Pain   albuterol  sulfate HFA (VENTOLIN  HFA) 108 (90 Base) MCG/ACT inhaler Past Month  No Yes   Sig: Inhale 2 puffs into the lungs 4 times daily as needed for Wheezing   apixaban  (ELIQUIS ) 5 MG TABS tablet 04/16/2024  Yes Yes   Sig: Take 1 tablet by mouth 2 times daily   blood glucose monitor strips Not Taking  No No   Sig: Test 1-2 times a day & as needed for symptoms of irregular blood glucose. Dispense sufficient amount for indicated testing frequency plus additional to accommodate PRN testing needs. Pharmacist to identify preferred brand.   Patient not taking: Reported on 04/14/2024   chlorthalidone  (HYGROTON ) 25 MG tablet 04/13/2024 Morning  Yes Yes   Sig: Take 1 tablet by mouth daily   escitalopram  (LEXAPRO ) 5 MG tablet 04/13/2024 Bedtime  Yes Yes   Sig: Take  1 tablet by mouth daily   metoprolol  tartrate (LOPRESSOR ) 25 MG tablet Not Taking  No No   Sig: Take 0.5 tablets by mouth 2 times daily   Patient not taking: Reported on 04/14/2024   predniSONE  (DELTASONE ) 20 MG tablet Not Taking  No No   Sig: Take 2 tablets by mouth daily for 3 doses   Patient not taking: Reported on 04/14/2024   sertraline  (ZOLOFT ) 100 MG tablet 04/13/2024 Morning  Yes Yes   Sig: Take 1 tablet by mouth daily   sertraline  (ZOLOFT ) 50 MG tablet 04/13/2024 Bedtime  Yes Yes   Sig: Take 1 tablet by mouth nightly      Facility-Administered Medications: None       PHYSICAL EXAM:  The patient is examined immediately prior to the procedure.  Vitals:    04/17/24 0711   BP: (!) 125/98   Pulse: 63   Resp: 18   Temp: 98 F (36.7 C)   SpO2: 100%     Gen: Appears comfortable, no distress.  Pulm: comfortable respirations with no abnormal audible breath sounds  HEART: well perfused, no abnormal audible heart sounds  GI: abdomen flat.    PLAN:  Informed consent discussion held, patient afforded an opportunity to ask questions and all questions  answered.  After being advised of the risks, benefits, and alternatives, the patient requested that we proceed and indicated so on a written consent form.      Will proceed with procedure as planned.  Cassandria Peters, MD

## 2024-04-17 NOTE — Plan of Care (Signed)
 Problem: Pain  Goal: Verbalizes/displays adequate comfort level or baseline comfort level  04/17/2024 1208 by Devora Bottcher, RN  Outcome: Adequate for Discharge  04/16/2024 2258 by Tiron, Teresita G, RN  Outcome: Progressing

## 2024-04-17 NOTE — Progress Notes (Addendum)
 Breanna Lester  09/17/1960  [id number]    Situation:  Verbal report received from:  Heidi, RN   Procedure: Procedure(s):  ESOPHAGOGASTRODUODENOSCOPY    Background:    Preoperative diagnosis: Esophagitis [K20.90]  Postoperative diagnosis: * No post-op diagnosis entered *    Operator:  Dr. Madlyn   Assistant(s): Circulator: Barbarann Ike RAMAN, RN  Circulator Assist: Vinita Dixon, RN    Specimens: * No specimens in log *  H. Pylori     no    Assessment:  Intra-procedure medications g    Anesthesia gave intra-procedure sedation and medications, see anesthesia flow sheet    yes    Intravenous fluids: NS@ KVO     Vital signs stable    yes    Abdominal assessment: round and soft    yes    Recommendation:  Discharge patient per MD order  inpatient.  Return to floor   room 522  Family or Friend   n/a  Permission to share finding with family or friend    n/a

## 2024-04-17 NOTE — Progress Notes (Signed)
 Initial RN admission and assessment performed and documented in Endoscopy navigator.     Patient evaluated by anesthesia in pre-procedure holding.     All procedural vital signs, airway assessment, and level of consciousness information monitored and recorded by anesthesia staff on the anesthesia record.     Report received from CRNA post procedure.  Patient transported to recovery area by RN.    Endoscopy post procedure time out was performed and specimens were verified by physician.    Endoscope was pre-cleaned at bedside immediately following procedure by Maddie, RN.

## 2024-04-17 NOTE — Anesthesia Post-Procedure Evaluation (Signed)
 Department of Anesthesiology  Postprocedure Note    Patient: Breanna Lester  MRN: [id number]  Birthdate: 10/07/1960  Date of evaluation: 04/17/2024    Procedure Summary       Date: 04/17/24 Room / Location: [facility] / [facility]    Anesthesia Start: 0814 Anesthesia Stop: 0828    Procedure: ESOPHAGOGASTRODUODENOSCOPY (Upper GI Region) Diagnosis:       Esophagitis      (Esophagitis [K20.90])    Surgeons: Madlyn Fendt, MD Responsible Provider: Karyle Rosalin Eland, MD    Anesthesia Type: MAC ASA Status: 3            Anesthesia Type: No value filed.    Aldrete Phase I: Aldrete Score: 10    Aldrete Phase II: Aldrete Score: 9    Anesthesia Post Evaluation    Patient location during evaluation: bedside  Patient participation: complete - patient participated  Level of consciousness: awake and alert and responsive to verbal stimuli  Pain score: 0  Airway patency: patent  Nausea & Vomiting: no nausea and no vomiting  Cardiovascular status: hemodynamically stable  Respiratory status: acceptable and room air  Hydration status: stable  Pain management: adequate      No notable events documented.

## 2024-04-17 NOTE — Progress Notes (Signed)
 Patient off unit having an EGD done this am unable to perform an assessment.

## 2024-04-17 NOTE — Op Note (Addendum)
 [name]                         [facility]  [place] [facility] [place], [place]  [phone]      April 17, 2024    Esophagogastroduodenoscopy (EGD) Procedure Note  Breanna Lester  DOB: [birth year]   Medical Record Number: [id number]      Indications:   Epigastric abdominal pain, chemical pancreatitis, normal CBC, LFTs with minimal transaminase elevation, right upper quadrant ultrasound with gallstones but otherwise normal-appearing gallbladder, CT abdomen pelvis with platelet change in the mesentery, normal-appearing pancreas, distant history of colonoscopy, improving on supportive measures tolerating regular diet, found to have by multilobar pneumonia now on ceftriaxone  and azithromycin     Referring Physician:  Unknown, Provider, NP-C  Anesthesia/Sedation: Monitored anesthesia care, see separate note  Endoscopist:  Cassandria Peters, MD   Complications:  None  Estimated Blood Loss:  None    Permit:  The indications, risks, benefits and alternatives were reviewed with the patient or their decision maker who was provided an opportunity to ask questions and all questions were answered.  The specific risks of esophagogastroduodenoscopy with conscious sedation were reviewed, including but not limited to anesthetic complication, bleeding, adverse drug reaction, missed lesion, infection, IV site reactions, and intestinal perforation which would lead to the need for surgical repair.  Alternatives to EGD including radiographic imaging, observation without testing, or laboratory testing were reviewed as well as the limitations of those alternatives discussed.  After considering the options and having all their questions answered, the patient or their decision maker provided both verbal and written consent to proceed.       Procedure in Detail:  After obtaining informed consent, positioning of the patient in the left lateral decubitus position, and conduction of a pre-procedure pause  or time out the endoscope was introduced into the mouth and advanced to the duodenum.  A careful inspection was made, and findings or interventions are described below.    Findings:   Esophagus-normal-appearing esophagus and Z-line, widely patent benign-appearing lower esophageal ring  Stomach-diffuse erythematous change throughout the stomach, nodular erosive subcentimeter gastropathy benign-appearing in the gastric antrum nonobstructive, biopsies deferred as the patient did not discontinue anticoagulation preprocedure  Duodenum-diffusely scalloped mucosa without focal lesions to the D3 segment    Specimens: None    Impression:   Diffuse edematous erythematous gastropathy, benign-appearing subcentimeter nodular erosive change in the gastric antrum  Diffusely scalloped proximal small intestine  No biopsies obtained as patient did not discontinue anticoagulation           Recommendations:   I have ordered H. pylori stool antigen testing and will treat if positive  I will order celiac serologies and have the patient follow-up in the out patient setting with our group  With nodular erosive antral gastropathy would leave on Protonix  40 mg once daily for the next 8 weeks  With regard to platelet changes in the mesentery on CT scan, would favor repeating the scan in 4 to 6 weeks time and CT abdomen pelvis with IV and oral contrast to reassess  Abdominal symptoms have largely resolved; patient has chemical pancreatitis based on elevated lipase, epigastric abdominal pain with no radiographic features of pancreatitis and was found to have gallstones on right upper quadrant ultrasound; patient would likely benefit from some discussion with surgery with regard to interval cholecystectomy after pneumonia concerns resolve to decrease risk of recurrent pancreatitis  With normal CBC, no iron or B12 deficiency, consideration of colonoscopy for further evaluation of abdominal pain can likely be deferred to the outpatient setting  given improvement of symptoms with supportive measures  Inpatient GI will sign off given the patient's trend towards rapid clinical improvement with supportive measures    Will contact you with biopsy results by patient letter in 7-14 business days when available  Please contact my office at [phone] for any questions or concerns.       Thank you for entrusting me with this patient's care.  Please do not hesitate to contact me with any questions or if I can be of assistance with any of your other patients' GI needs.  Signed By: Cassandria Peters, MD                        April 17, 2024     Surgical assistant none.  Implants none unless specified.

## 2024-04-23 ENCOUNTER — Inpatient Hospital Stay
Admit: 2024-04-23 | Discharge: 2024-04-25 | Disposition: A | Discharge status: Home / Self Care | Payer: Medicare (Managed Care)

## 2024-04-23 ENCOUNTER — Emergency Department: Admit: 2024-04-23 | Payer: Medicare (Managed Care) | Primary: Diagnostic Radiology

## 2024-04-23 DIAGNOSIS — R197 Diarrhea, unspecified: Principal | ICD-10-CM

## 2024-04-23 DIAGNOSIS — K8050 Calculus of bile duct without cholangitis or cholecystitis without obstruction: Secondary | ICD-10-CM

## 2024-04-23 LAB — BASIC METABOLIC PANEL
Anion Gap: 12 mmol/L (ref 2–12)
BUN/Creatinine Ratio: 23 — ABNORMAL HIGH (ref 12–20)
BUN: 18 mg/dL (ref 8–23)
CO2: 30 mmol/L — ABNORMAL HIGH (ref 22–29)
Calcium: 8.8 mg/dL (ref 8.8–10.2)
Chloride: 102 mmol/L (ref 98–107)
Creatinine: 0.78 mg/dL (ref 0.50–0.90)
Est, Glom Filt Rate: 85 ml/min/1.73m2 (ref >60)
Glucose: 146 mg/dL — ABNORMAL HIGH (ref 65–100)
Potassium: 3.2 mmol/L — ABNORMAL LOW (ref 3.5–5.1)
Sodium: 144 mmol/L (ref 136–145)

## 2024-04-23 LAB — CBC WITH AUTO DIFFERENTIAL
Basophils %: 0.4 % (ref 0.0–1.0)
Basophils Absolute: 0.02 K/UL (ref 0.00–0.10)
Eosinophils %: 0.9 % (ref 0.0–7.0)
Eosinophils Absolute: 0.05 K/UL (ref 0.00–0.40)
Hematocrit: 35.7 % (ref 35.0–47.0)
Hemoglobin: 10.9 g/dL — ABNORMAL LOW (ref 11.5–16.0)
Immature Granulocytes %: 0.2 % (ref 0.0–0.5)
Immature Granulocytes Absolute: 0.01 K/UL (ref 0.00–0.04)
Lymphocytes %: 22.9 % (ref 12.0–49.0)
Lymphocytes Absolute: 1.27 K/UL (ref 0.80–3.50)
MCH: 27.3 pg (ref 26.0–34.0)
MCHC: 30.5 g/dL (ref 30.0–36.5)
MCV: 89.5 FL (ref 80.0–99.0)
MPV: 11.1 FL (ref 8.9–12.9)
Monocytes %: 7.4 % (ref 5.0–13.0)
Monocytes Absolute: 0.41 K/UL (ref 0.00–1.00)
Neutrophils %: 68.2 % (ref 32.0–75.0)
Neutrophils Absolute: 3.78 K/UL (ref 1.80–8.00)
Nucleated RBCs: 0 /100{WBCs}
Platelets: 111 K/uL — ABNORMAL LOW (ref 150–400)
RBC: 3.99 M/uL (ref 3.80–5.20)
RDW: 14.5 % (ref 11.5–14.5)
WBC: 5.5 K/uL (ref 3.6–11.0)
nRBC: 0 K/uL (ref 0.00–0.01)

## 2024-04-23 LAB — PROTIME-INR
INR: 1.3 — ABNORMAL HIGH (ref 0.9–1.1)
Protime: 16.1 s — ABNORMAL HIGH (ref 11.9–14.1)

## 2024-04-23 LAB — OCCULT BLOOD, FECAL: POC Occult Blood, Fecal: POSITIVE — AB

## 2024-04-23 MED ORDER — IOPAMIDOL 76 % IV SOLN
76 | Freq: Once | INTRAVENOUS | Status: AC | PRN
Start: 2024-04-23 — End: 2024-04-23
  Administered 2024-04-23: 100 mL via INTRAVENOUS

## 2024-04-23 MED FILL — ISOVUE-370 76 % IV SOLN: 76 % | INTRAVENOUS | Qty: 100

## 2024-04-23 NOTE — ED Notes (Signed)
 Patient placed on continuous cardiac monitoring.

## 2024-04-24 LAB — POCT GLUCOSE
POC Glucose: 119 mg/dL — ABNORMAL HIGH (ref 65–117)
POC Glucose: 132 mg/dL — ABNORMAL HIGH (ref 65–117)
POC Glucose: 141 mg/dL — ABNORMAL HIGH (ref 65–117)
POC Glucose: 183 mg/dL — ABNORMAL HIGH (ref 65–117)
POC Glucose: 98 mg/dL (ref 65–117)

## 2024-04-24 LAB — COMPREHENSIVE METABOLIC PANEL
ALT: 20 U/L (ref 10–35)
AST: 28 U/L (ref 10–35)
Albumin/Globulin Ratio: 0.7 — ABNORMAL LOW (ref 1.1–2.2)
Albumin: 2.5 g/dL — ABNORMAL LOW (ref 3.5–5.2)
Alk Phosphatase: 97 U/L (ref 35–104)
Anion Gap: 9 mmol/L (ref 2–14)
BUN/Creatinine Ratio: 19 (ref 12–20)
BUN: 15 mg/dL (ref 8–23)
CO2: 30 mmol/L — ABNORMAL HIGH (ref 20–29)
Calcium: 8.7 mg/dL — ABNORMAL LOW (ref 8.8–10.2)
Chloride: 103 mmol/L (ref 98–107)
Creatinine: 0.8 mg/dL (ref 0.60–1.00)
Est, Glom Filt Rate: 83 ml/min/1.73m2 (ref >59)
Globulin: 3.5 g/dL (ref 2.0–4.0)
Glucose: 134 mg/dL — ABNORMAL HIGH (ref 65–100)
Potassium: 3.3 mmol/L — ABNORMAL LOW (ref 3.5–5.1)
Sodium: 142 mmol/L (ref 136–145)
Total Bilirubin: 0.6 mg/dL (ref 0.0–1.2)
Total Protein: 6.1 g/dL — ABNORMAL LOW (ref 6.4–8.3)

## 2024-04-24 LAB — CBC WITH AUTO DIFFERENTIAL
Basophils %: 0.7 % (ref 0.0–1.0)
Basophils Absolute: 0.03 K/UL (ref 0.00–0.10)
Eosinophils %: 1.7 % (ref 0.0–7.0)
Eosinophils Absolute: 0.08 K/UL (ref 0.00–0.40)
Hematocrit: 35.6 % (ref 35.0–47.0)
Hemoglobin: 10.5 g/dL — ABNORMAL LOW (ref 11.5–16.0)
Immature Granulocytes %: 0.4 % (ref 0.0–0.5)
Immature Granulocytes Absolute: 0.02 K/UL (ref 0.00–0.04)
Lymphocytes %: 23.8 % (ref 12.0–49.0)
Lymphocytes Absolute: 1.09 K/UL (ref 0.80–3.50)
MCH: 26.4 pg (ref 26.0–34.0)
MCHC: 29.5 g/dL — ABNORMAL LOW (ref 30.0–36.5)
MCV: 89.4 FL (ref 80.0–99.0)
MPV: 11.3 FL (ref 8.9–12.9)
Monocytes %: 7.6 % (ref 5.0–13.0)
Monocytes Absolute: 0.35 K/UL (ref 0.00–1.00)
Neutrophils %: 65.8 % (ref 32.0–75.0)
Neutrophils Absolute: 3.01 K/UL (ref 1.80–8.00)
Nucleated RBCs: 0 /100{WBCs}
Platelets: 111 K/uL — ABNORMAL LOW (ref 150–400)
RBC: 3.98 M/uL (ref 3.80–5.20)
RDW: 14.6 % — ABNORMAL HIGH (ref 11.5–14.5)
WBC: 4.6 K/uL (ref 3.6–11.0)
nRBC: 0 K/uL (ref 0.00–0.01)

## 2024-04-24 LAB — PHOSPHORUS: Phosphorus: 3 mg/dL (ref 2.5–4.5)

## 2024-04-24 LAB — FOLATE: Folate: 14.2 ng/mL (ref 4.8–24.2)

## 2024-04-24 LAB — PROCALCITONIN: Procalcitonin: 0.08 ng/mL

## 2024-04-24 LAB — HEMOGLOBIN AND HEMATOCRIT
Hematocrit: 39 % (ref 35.0–47.0)
Hemoglobin: 11.7 g/dL (ref 11.5–16.0)

## 2024-04-24 LAB — HEMOGLOBIN A1C
Estimated Avg Glucose: 188 mg/dL
Hemoglobin A1C: 8.2 % — ABNORMAL HIGH (ref 4.0–5.6)

## 2024-04-24 LAB — LIPASE
Lipase: 54 U/L (ref 13–60)
Lipase: 40 U/L (ref 13–60)

## 2024-04-24 LAB — LACTIC ACID: Lactic Acid: 1 mmol/L (ref 0.5–2.0)

## 2024-04-24 LAB — C-REACTIVE PROTEIN: CRP: 3.3 mg/dL — ABNORMAL HIGH (ref 0.0–0.5)

## 2024-04-24 LAB — MAGNESIUM: Magnesium: 1.7 mg/dL (ref 1.6–2.4)

## 2024-04-24 LAB — FERRITIN: Ferritin: 87 ng/mL (ref 13–252)

## 2024-04-24 LAB — VITAMIN B12: Vitamin B-12: 795 pg/mL (ref 232–1245)

## 2024-04-24 MED ORDER — PROCHLORPERAZINE EDISYLATE 10 MG/2ML IJ SOLN
10 | Freq: Four times a day (QID) | INTRAMUSCULAR | Status: DC | PRN
Start: 2024-04-24 — End: 2024-04-25

## 2024-04-24 MED ORDER — INSULIN LISPRO 100 UNIT/ML IJ SOLN
100 | Freq: Four times a day (QID) | INTRAMUSCULAR | Status: DC
Start: 2024-04-24 — End: 2024-04-25
  Administered 2024-04-25: 2 [IU] via SUBCUTANEOUS

## 2024-04-24 MED ORDER — NORMAL SALINE FLUSH 0.9 % IV SOLN
0.9 | Freq: Two times a day (BID) | INTRAVENOUS | Status: DC
Start: 2024-04-24 — End: 2024-04-25
  Administered 2024-04-24 – 2024-04-25 (×2): 10 mL via INTRAVENOUS

## 2024-04-24 MED ORDER — AMOXICILLIN 250 MG PO CAPS
250 | Freq: Three times a day (TID) | ORAL | Status: DC
Start: 2024-04-24 — End: 2024-04-25
  Administered 2024-04-24 – 2024-04-25 (×4): 1000 mg via ORAL

## 2024-04-24 MED ORDER — MORPHINE SULFATE (PF) 2 MG/ML IV SOLN
2 | INTRAVENOUS | Status: DC | PRN
Start: 2024-04-24 — End: 2024-04-25
  Administered 2024-04-24 – 2024-04-25 (×5): 2 mg via INTRAVENOUS

## 2024-04-24 MED ORDER — ALBUTEROL SULFATE (2.5 MG/3ML) 0.083% IN NEBU
Freq: Four times a day (QID) | RESPIRATORY_TRACT | Status: DC | PRN
Start: 2024-04-24 — End: 2024-04-24

## 2024-04-24 MED ORDER — IPRATROPIUM-ALBUTEROL 0.5-2.5 (3) MG/3ML IN SOLN
0.5-2.5 | RESPIRATORY_TRACT | Status: DC | PRN
Start: 2024-04-24 — End: 2024-04-25
  Administered 2024-04-25: 12:00:00 1 via RESPIRATORY_TRACT

## 2024-04-24 MED ORDER — PANTOPRAZOLE SODIUM 40 MG IV SOLR
40 | Freq: Two times a day (BID) | INTRAVENOUS | Status: DC
Start: 2024-04-24 — End: 2024-04-25
  Administered 2024-04-24 – 2024-04-25 (×4): 40 mg via INTRAVENOUS

## 2024-04-24 MED ORDER — ACETAMINOPHEN 325 MG PO TABS
325 | Freq: Four times a day (QID) | ORAL | Status: DC | PRN
Start: 2024-04-24 — End: 2024-04-25

## 2024-04-24 MED ORDER — ACETAMINOPHEN 650 MG RE SUPP
650 | Freq: Four times a day (QID) | RECTAL | Status: DC | PRN
Start: 2024-04-24 — End: 2024-04-25

## 2024-04-24 MED ORDER — CULTURELLE PO CAPS
Freq: Every day | ORAL | Status: DC
Start: 2024-04-24 — End: 2024-04-25
  Administered 2024-04-25: 13:00:00 1 via ORAL

## 2024-04-24 MED ORDER — NORMAL SALINE FLUSH 0.9 % IV SOLN
0.9 | INTRAVENOUS | Status: DC | PRN
Start: 2024-04-24 — End: 2024-04-25

## 2024-04-24 MED ORDER — POTASSIUM CHLORIDE 10 MEQ/100ML IV SOLN
10 | INTRAVENOUS | Status: AC
Start: 2024-04-24 — End: 2024-04-24
  Administered 2024-04-24 (×2): 10 meq via INTRAVENOUS

## 2024-04-24 MED ORDER — DICYCLOMINE HCL 20 MG PO TABS
20 | Freq: Once | ORAL | Status: AC
Start: 2024-04-24 — End: 2024-04-23
  Administered 2024-04-24: 02:00:00 20 mg via ORAL

## 2024-04-24 MED ORDER — POTASSIUM CHLORIDE IN NACL 20-0.9 MEQ/L-% IV SOLN
20-0.9 | INTRAVENOUS | Status: DC
Start: 2024-04-24 — End: 2024-04-25
  Administered 2024-04-24 – 2024-04-25 (×4): via INTRAVENOUS

## 2024-04-24 MED ORDER — POLYETHYLENE GLYCOL 3350 17 G PO PACK
17 | Freq: Every day | ORAL | Status: DC | PRN
Start: 2024-04-24 — End: 2024-04-25

## 2024-04-24 MED ORDER — KETOROLAC TROMETHAMINE 30 MG/ML IJ SOLN
30 | INTRAMUSCULAR | Status: DC
Start: 2024-04-24 — End: 2024-04-23

## 2024-04-24 MED ORDER — ONDANSETRON HCL 4 MG/2ML IJ SOLN
4 | Freq: Four times a day (QID) | INTRAMUSCULAR | Status: DC | PRN
Start: 2024-04-24 — End: 2024-04-25

## 2024-04-24 MED ORDER — HYDROMORPHONE 0.5MG/0.5ML IJ SOLN
1 | Status: DC | PRN
Start: 2024-04-24 — End: 2024-04-24

## 2024-04-24 MED ORDER — ALBUTEROL SULFATE HFA 108 (90 BASE) MCG/ACT IN AERS
108 | Freq: Four times a day (QID) | RESPIRATORY_TRACT | Status: DC | PRN
Start: 2024-04-24 — End: 2024-04-24

## 2024-04-24 MED ORDER — SODIUM CHLORIDE 0.9 % IV SOLN
0.9 | INTRAVENOUS | Status: DC | PRN
Start: 2024-04-24 — End: 2024-04-25

## 2024-04-24 MED FILL — KETOROLAC TROMETHAMINE 30 MG/ML IJ SOLN: 30 mg/mL | INTRAMUSCULAR | Qty: 1

## 2024-04-24 MED FILL — HYDROMORPHONE HCL 1 MG/ML IJ SOLN: 1 mg/mL | INTRAMUSCULAR | Qty: 0.5

## 2024-04-24 MED FILL — MORPHINE SULFATE 2 MG/ML IJ SOLN: 2 mg/mL | INTRAMUSCULAR | Qty: 1 | Fill #0

## 2024-04-24 MED FILL — DICYCLOMINE HCL 20 MG PO TABS: 20 mg | ORAL | Qty: 1

## 2024-04-24 MED FILL — PANTOPRAZOLE SODIUM 40 MG IV SOLR: 40 mg | INTRAVENOUS | Qty: 40

## 2024-04-24 MED FILL — ACETAMINOPHEN 325 MG PO TABS: 325 mg | ORAL | Qty: 2

## 2024-04-24 MED FILL — AMOXICILLIN 250 MG PO CAPS: 250 mg | ORAL | Qty: 4 | Fill #0

## 2024-04-24 MED FILL — BD POSIFLUSH 0.9 % IV SOLN: 0.9 % | INTRAVENOUS | Qty: 40

## 2024-04-24 MED FILL — POTASSIUM CHLORIDE 10 MEQ/100ML IV SOLN: 10 MEQ/0ML | INTRAVENOUS | Qty: 100 | Fill #0

## 2024-04-24 MED FILL — CULTURELLE PO CAPS: ORAL | Qty: 1

## 2024-04-24 MED FILL — POTASSIUM CHLORIDE IN NACL 20-0.9 MEQ/L-% IV SOLN: 20-0.9 MEQ/L-% | INTRAVENOUS | Qty: 1000

## 2024-04-24 NOTE — Plan of Care (Signed)
 Problem: Chronic Conditions and Co-morbidities  Goal: Patient's chronic conditions and co-morbidity symptoms are monitored and maintained or improved  Outcome: Progressing     Problem: Safety - Adult  Goal: Free from fall injury  Outcome: Progressing

## 2024-04-24 NOTE — ED Notes (Signed)
 TRANSFER - OUT REPORT:    Verbal report given to 3rd floor nurse on Breanna Lester  being transferred to [facility] for routine progression of patient care       Report consisted of patient's Situation, Background, Assessment and   Recommendations(SBAR).     Information from the following report(s) Nurse Handoff Report, ED Encounter Summary, and ED SBAR was reviewed with the receiving nurse.           Lines:   Peripheral IV 04/23/24 Right Antecubital (Active)        Opportunity for questions and clarification was provided.      Patient transported with:  Registered Nurse and [employer]

## 2024-04-24 NOTE — ED Notes (Signed)
 Pt preferred to stay in clothes instead of gown.

## 2024-04-24 NOTE — Plan of Care (Signed)
 Problem: Chronic Conditions and Co-morbidities  Goal: Patient's chronic conditions and co-morbidity symptoms are monitored and maintained or improved  04/24/2024 1122 by Henry Maurilio PEDLAR, RN  Outcome: Progressing  04/24/2024 0342 by Cleotilde Knee, RN  Outcome: Progressing     Problem: Discharge Planning  Goal: Discharge to home or other facility with appropriate resources  04/24/2024 1122 by Henry Maurilio PEDLAR, RN  Outcome: Progressing  04/24/2024 0342 by Cleotilde Knee, RN  Outcome: Progressing     Problem: Safety - Adult  Goal: Free from fall injury  04/24/2024 1122 by Henry Maurilio PEDLAR, RN  Outcome: Progressing  04/24/2024 0342 by Cleotilde Knee, RN  Outcome: Progressing     Problem: Pain  Goal: Verbalizes/displays adequate comfort level or baseline comfort level  Outcome: Progressing

## 2024-04-24 NOTE — Progress Notes (Signed)
 [facility]  [address] [place], [place] [address]  [phone]        Hospitalist Progress Note      NAME: KASHMIR LEEDY   DOB:  [birth year]  MRM:  [id number]    Date/Time of service: 04/24/2024  1:40 PM       Subjective/Interval History:     Chief Complaint:  Patient was personally seen and examined by me during this time period.  Chart reviewed.      Patient was seen this morning resting comfortably in bed.  She reports diffuse generalized abdominal pain, worsening to her right upper quadrant.  Discussed her outpatient care/follow-up for her anemia/cirrhosis.  She does report she is from [place] and has recently moved to [place], requiring o/p care to be set up while here.       Objective:       Vitals:       Last 24hrs VS reviewed since prior progress note. Most recent are:    Vitals:    04/24/24 1147   BP:    Pulse:    Resp: 18   Temp:    SpO2:      SpO2 Readings from Last 6 Encounters:   04/24/24 96%   04/17/24 97%   04/11/24 95%   02/26/24 94%   02/18/24 92%   03/19/22 96%          Intake/Output Summary (Last 24 hours) at 04/24/2024 1340  Last data filed at 04/24/2024 0813  Gross per 24 hour   Intake 639.77 ml   Output --   Net 639.77 ml        Exam:     Physical Exam:    Gen:  Obese, chronically ill appearing, in no acute distress  HEENT:  Pink conjunctivae, hearing intact to voice, moist mucous membranes  Neck:  Supple, without masses, thyroid non-tender  Resp:  No accessory muscle use, clear breath sounds without wheezes rales or rhonchi  Card:  S1, S2 without thrills, bruits or peripheral edema  Abd:  Soft, TTP to all quadrants, pannus, normoactive bowel sounds are present  Musc:  No cyanosis or clubbing  Skin:  No rashes or ulcers, skin turgor is good. + jaundice  Neuro:  Cranial nerves 3-12 are grossly intact, follows commands appropriately  Psych:  Good insight, oriented to person, place and time, alert      Medications Reviewed: (see below)    Lab Data Reviewed: (see  below)    ______________________________________________________________________    Medications:     Current Facility-Administered Medications   Medication Dose Route Frequency    morphine  (PF) injection 2 mg  2 mg IntraVENous Q4H PRN    sodium chloride  flush 0.9 % injection 5-40 mL  5-40 mL IntraVENous 2 times per day    sodium chloride  flush 0.9 % injection 5-40 mL  5-40 mL IntraVENous PRN    0.9 % sodium chloride  infusion   IntraVENous PRN    ondansetron  (ZOFRAN ) injection 4 mg  4 mg IntraVENous Q6H PRN    polyethylene glycol (GLYCOLAX ) packet 17 g  17 g Oral Daily PRN    acetaminophen  (TYLENOL ) tablet 650 mg  650 mg Oral Q6H PRN    Or    acetaminophen  (TYLENOL ) suppository 650 mg  650 mg Rectal Q6H PRN    0.9% NaCl with KCl 20 mEq infusion   IntraVENous Continuous    pantoprazole  (PROTONIX ) 40 mg in sodium chloride  (PF) 0.9 % 10  mL injection  40 mg IntraVENous Q12H    lactobacillus (CULTURELLE) capsule 1 capsule  1 capsule Oral Daily with breakfast    insulin  lispro (HUMALOG ,ADMELOG ) injection vial 0-8 Units  0-8 Units SubCUTAneous 4x Daily AC & HS    ipratropium 0.5 mg-albuterol  2.5 mg (DUONEB ) nebulizer solution 1 Dose  1 Dose Inhalation Q4H PRN    prochlorperazine  (COMPAZINE ) injection 10 mg  10 mg IntraVENous Q6H PRN          Lab Review:     Recent Labs     04/23/24  1901 04/24/24  0459   WBC 5.5 4.6   HGB 10.9* 10.5*   HCT 35.7 35.6   PLT 111* 111*     Recent Labs     04/23/24  1901 04/24/24  0459   NA 144 142   K 3.2* 3.3*   CL 102 103   CO2 30* 30*   BUN 18 15   MG  --  1.7   PHOS  --  3.0   ALT  --  20   INR 1.3*  --      No results found for: GLUCPOC       Assessment / Plan:     63 yo hx of HTN, DM, Pafib on Eliquis , COPD, recent pancreatitis, presented w/ recurrent RUQ pain, biliary colic, GI bleed     Acute GI bleeding: likely due to gastropathy, Eliquis  use.  S/p recent EGD. GI has evaluated, rec continue PPI, likely o/p colonoscopy. Recommend avoiding NSAIDs, noted recent fill hx for such      Recurrent acute abd pain/biliary colic: Gen surg to assess for lap chole this evening. Pt high risk for surgery considering comorbidities, O2 requirement, RCRI 2.  Cont IVF, IV antiemetics, IV morphine  prn severe pain. Stool studies ordered to r/o infection per GI recs     Anemia: likely blood loss anemia.  Iron studies pending, monitor Hgb closely. Cont to hold Eliquis  for now pending possible surgery as above    Recent CAP with new O2 requirement. D/c on amoxicillin  500mg /d, has not completed course, will restart while here for another 5 days. Completed Azithromycin . Cont pulmonary hygiene     HTN/Pafib: hold hygroten, toprol , eliquis .  Restart Toprol  once taking PO    Hypokalemia K 3.3, cont IVF w K, add additional 9/4    Metabolic alkalosis, bicarb 30. Likely 2/2 recent GI losses/chlorthalidone  use, pt denies recent antacid use. Hold chlorthalidone  as above.     DM type 2: A1C 8.2. On metformin  PTA, could likely benefit from GLP1RA for weight loss when acute GI issues have resolved. Start SSI.      Cirrhosis: seen on recent imaging.  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  Synthetic function stable.  Will need outpatient f/u in [place] (from [place])    COPD/Underlying OSA/OHS suspected. Cont PRN SABA, recommend o/p f/u w pulm/sleep study    **Prior records, notes, labs, radiology, and medications reviewed in electronic medical record**                 Care Plan discussed with: Patient, Care Manager, and Nursing Staff    Discussed:  Care Plan    Prophylaxis:  SCD's    Disposition:  Home w/Family    Discussed plan of care with attending MD     (Please note that portions of this note were completed with a voice recognition program. Efforts were made to edit the dictations but occasionally words are mis-transcribed.)  ___________________________________________________    Jeoffrey LITTIE New, APRN - CNP

## 2024-04-24 NOTE — Care Coordination-Inpatient (Signed)
 Care Management Initial Assessment  04/24/2024 3:29 PM  If patient is discharged prior to next notation, then this note serves as note for discharge by case management.    Reason for Admission:   Bloody stool [K92.1]  Acute GI bleeding [K92.2]  Diarrhea, unspecified type [R19.7]         Patient Admission Status: Inpatient  Date Admitted to INP: 04/23/2024  RUR: Readmission Risk Score: 23.5    Hospitalization in the last 30 days (Readmission):  Yes        Advance Care Planning:  Code Status: Full Code  Primary Healthcare Decision Maker: Legal Next of Kin Zayne Marovich (dtr) [phone])   Advance Directive: has an advanced directive - a copy has been provided     __________________________________________________________________________  Assessment:     Pt was admitted on 04/23/2024 for bloody stool. CM met with Pt to complete initial assessment. CM introduced self, CM role, and verified demographics.        04/24/24 1521   Service Assessment   Patient Orientation Alert and Oriented   Cognition Alert   History Provided By Patient   Primary Caregiver Self   Support Systems Children  Robynn Marcel (dtr) [phone])   Patient's Healthcare Decision Maker is: Legal Next of Kin  Delania Ferg (dtr) [phone])   PCP Verified by CM Yes  (Pt does not recall her PCP's name)   Last Visit to PCP Within last 6 months   Prior Functional Level Independent in ADLs/IADLs   Current Functional Level Independent in ADLs/IADLs   Can patient return to prior living arrangement Yes   Ability to make needs known: Good   Family able to assist with home care needs: Yes   Would you like for me to discuss the discharge plan with any other family members/significant others, and if so, who? No   Financial Resources Medicare  (Anthem Medicare)   Community Resources None   CM/SW Referral Other (see comment)  (DC Planning)   Social/Functional History   Lives With Daughter   Type of Home   ([facility] [place] in [place]  [address], , [place] [address]   Room:  114)   Bathroom Chemical engineer Oxygen  (Oxygen at 2L NC through [facility].  Pt has a portable and a concentrator)   Receives Help From Family;Friend(s)   Prior Level of Assist for ADLs Independent   Prior Level of Assist for [employer] Independent   Ambulation Assistance Independent   Prior Level of Assist for Transfers Independent   Active Driver No   Patient's Driver Info patient takes Uber/Lyft   Occupation On disability   Discharge Planning   Type of Residence Other (Comment)  ([facility] [place] in [place]  [address], [place], [place] [address]   Room:  114)   Living Arrangements Children   Current Services Prior To Admission Oxygen Therapy  ([facility]:  O2 at 2L NC.)   Potential Assistance Needed N/A   DME Ordered? No   Potential Assistance Purchasing Medications No   Type of Home Care Services None   Patient expects to be discharged to: Other (comment)  ([facility] in [place]  [address] [place], [place], [place] [address]   Room:  114)   History of falls? 0   Services At/After Discharge   Transition of Care Consult (CM Consult) Transportation Assistance  Services At/After Discharge Transport   Mode of Transport at Discharge Other (see comment)   Confirm Follow Up Transport Family   Condition of Participation: Discharge Planning   The Plan for Transition of Care is related to the following treatment goals: Bloody Stool         Comments:     Discharge Concerns: [] Yes [x] No [] Unknown   Describe:    Financial concerns/barriers: [] Yes, explain: [x] No [] Unknown/Not discussed  __________________________________________________________________________    Insurer:   Active Insurance as of 04/23/2024       Primary Coverage       Payor Plan Insurance Group Employer/Plan Group    [place] BCBS [place] [employer] MEDIBLUE PLUS [id number]       Payor Plan Address Payor Plan Phone Number Payor Plan Fax  Number Effective Dates    [name] [phone]   07/11/2023 - None Entered    [place]  [place] [address]         Subscriber Name Subscriber Birth Date Member ID       CAYMAN, BROGDEN September 20, 1960 [id number]                     PCP: Unknown, Provider, NP-C   Address: None   Phone number: None    Pharmacy:   [facility] [id number] [place], [place] - [address] [place] [phone] [place] [phone]  [phone] [place] [address]  Phone: [phone] Fax: [phone]      DC Transport: (P) Family       Transition of care plan:    [x]  Home. Will need transportation assistance via Uber/Lyft.    [facility] [address] in [place]  [address] [place], [place], [place] [address]   Room:  114    DME:  Patient currently does not have her O2 portable tank at bedside.  She is on 2L NC.  CM to contact [facility] at discharge, to deliver at portable tank at bedside.      CM Consult:  Financial Assistance:  INCOME:  $1490/month on Disability.  She also stated that her daughter works at a nearby [employer].  They are currently staying in a motel and costs them close to $90/night.      Patient is fully aware that she has to contact Social Services for assistance, maybe increase her Disability income or obtain her Medicaid benefits.  She stated that she knows that she has to contact her Community Case Worker to transfer her resources from [facility] to [facility], as she needs her Sales executive.  CM encourage her to reach out today, but she stated that she is too tired today, and will work on this tomorrow.  Her Medicaid was terminated due to being over income.      Patient stated no needs at this time.      Readmission Assessment  Number of Days since last admission?: 1-7 days  Previous Disposition: Home with Family  Who is being Interviewed: Patient  What was the patient's/caregiver's perception as to why they think they needed to return back to the hospital?: Other (Comment) (New Dx:  GIB)  Did you visit your Primary  Care Physician after you left the hospital, before you returned this time?: No (Patient back to the hospital)  Why weren't you able to visit your PCP?: Other (Comment) (Patient ended up to the hospital)  Did you see a specialist, such as Cardiac, Pulmonary, Orthopedic Physician, etc.  after you left the hospital?: No  Who advised the patient to return to the hospital?: Self-referral  Does the patient report anything that got in the way of taking their medications?: No  In our efforts to provide the best possible care to you and others like you, can you think of anything that we could have done to help you after you left the hospital the first time, so that you might not have needed to return so soon?: Discharge instructions that are concise, clear, and non contradictory      ______________________________________  Creighton Mulling, BSN, RN-CM  [facility] Thresa Bernardino- Care Management  Available via [facility]  04/24/2024  3:36 PM

## 2024-04-24 NOTE — Plan of Care (Signed)
 Problem: Chronic Conditions and Co-morbidities  Goal: Patient's chronic conditions and co-morbidity symptoms are monitored and maintained or improved  04/24/2024 2334 by Cleotilde Knee, RN  Outcome: Progressing  04/24/2024 1122 by Henry Maurilio PEDLAR, RN  Outcome: Progressing     Problem: Safety - Adult  Goal: Free from fall injury  04/24/2024 2334 by Cleotilde Knee, RN  Outcome: Progressing  04/24/2024 1122 by Henry Maurilio PEDLAR, RN  Outcome: Progressing     Problem: Pain  Goal: Verbalizes/displays adequate comfort level or baseline comfort level  04/24/2024 2334 by Cleotilde Knee, RN  Outcome: Progressing  04/24/2024 1122 by Henry Maurilio PEDLAR, RN  Outcome: Progressing

## 2024-04-24 NOTE — Progress Notes (Signed)
 Patient's home medications at bedside. Patient stated that she has no one to take them for her while she is in the hospital. Patient agreed to store her medications with security. Patient's home medications include five pill bottles, one pill box, one inhaler, and one bag of supplies to check blood sugar. Bag of accu-check supplies contains four pieces and ten lancets. Patient's home medications given to security guard at 1745.

## 2024-04-24 NOTE — H&P (Signed)
  [facility]  [address] [place], [place] [address]  [phone]        Hospitalist Admission History and Physical      NAME:  Breanna Lester   DOB:   [birth year]   MRN:  [id number]     PCP:  Unknown, Provider, NP-C     Date/Time of service:  04/24/2024  1:32 AM        Subjective:     CHIEF COMPLAINT: abd pain     HISTORY OF PRESENT ILLNESS:     The patient is a 63 yo hx of HTN, DM, Pafib on Eliquis , COPD, recent pancreatitis, presented w/ recurrent RUQ pain, biliary colic, GI bleed.  The patient was admitted to [facility] last week with abd pain, thought to be related to pancreatitis.  She was also found to have gallstones.  EGD showed gastropathy and patient was started on a PPI.  Soon after discharge, patient still c/o severe RUQ pain, associated with nausea, poor PO intake.  She also c/o rectal bleeding (after restarting eliquis ).  In the ED, Hgb was 10.9, lipase normal.      Allergies   Allergen Reactions    Latex Rash       Prior to Admission medications   Medication Sig Start Date End Date Taking? Authorizing Provider   ondansetron  (ZOFRAN -ODT) 4 MG disintegrating tablet Take 1 tablet by mouth every 8 hours as needed for Nausea or Vomiting 04/17/24   Helmich, Jamie L, MD   pantoprazole  (PROTONIX ) 40 MG tablet Take 1 tablet by mouth every morning (before breakfast) 04/17/24   Helmich, Warren LITTIE, MD   metFORMIN  (GLUCOPHAGE -XR) 500 MG extended release tablet Take 1 tablet by mouth daily (with breakfast) 04/17/24   Helmich, Jamie L, MD   metoprolol  tartrate (LOPRESSOR ) 25 MG tablet Take 0.5 tablets by mouth 2 times daily 04/11/24 06/10/24  Bertell Bathe, MD   albuterol  sulfate HFA (VENTOLIN  HFA) 108 (90 Base) MCG/ACT inhaler Inhale 2 puffs into the lungs 4 times daily as needed for Wheezing 04/11/24 06/10/24  Bertell Bathe, MD   Blood Glucose Monitoring Suppl (BLOOD GLUCOSE MONITOR SYSTEM) w/Device KIT Pharmacist to identify preferred meter and strips.  Patient not taking: Reported on 04/14/2024  04/11/24   Alfrieda Klinefelter A, APRN - CNS   blood glucose monitor strips Test 1-2 times a day & as needed for symptoms of irregular blood glucose. Dispense sufficient amount for indicated testing frequency plus additional to accommodate PRN testing needs. Pharmacist to identify preferred brand. 04/11/24   Smithson, Klinefelter LABOR, APRN - CNS   Lancets 30G MISC Test 1-2 times a day & as needed for symptoms of irregular blood glucose. Dispense sufficient amount for indicated testing frequency plus additional to accommodate PRN testing needs. Pharmacist to identify preferred brand.  Patient not taking: Reported on 04/14/2024 04/11/24   Alfrieda Klinefelter A, APRN - CNS   chlorthalidone  (HYGROTON ) 25 MG tablet Take 1 tablet by mouth daily    [provider]   sertraline  (ZOLOFT ) 100 MG tablet Take 1 tablet by mouth daily    [provider]   apixaban  (ELIQUIS ) 5 MG TABS tablet Take 1 tablet by mouth 2 times daily    [provider]   escitalopram  (LEXAPRO ) 5 MG tablet Take 1 tablet by mouth daily    [provider]   acetaminophen  (TYLENOL ) 325 MG tablet Take 1 tablet by mouth every 6 hours as needed for Pain 02/26/24 03/07/24  Cutchins, Morene DASEN, PA-C       Past Medical History:   Diagnosis Date    Anxiety     Depression     Gastrointestinal disorder     Heart failure (HCC)     Hyperlipidemia     Hypertension     Liver disease 2009    liver failure    Pneumonia         Past Surgical History:   Procedure Laterality Date    ADENOIDECTOMY  1972    BACK SURGERY      cement discs following a fall    CATARACT REMOVAL      CESAREAN SECTION  1986 and 1993    DILATION AND CURETTAGE OF UTERUS  1999    EGD TRANSORAL BIOPSY SINGLE/MULTIPLE  05/17/2009         HYSTERECTOMY (CERVIX STATUS UNKNOWN)  2008    TONSILLECTOMY  1972    UPPER GASTROINTESTINAL ENDOSCOPY N/A 04/17/2024    ESOPHAGOGASTRODUODENOSCOPY performed by Madlyn Fendt, MD at [facility] ENDOSCOPY       Social History     Tobacco Use    Smoking status:  Former     Current packs/day: 0.00     Types: Cigarettes     Quit date: 02/28/2008     Years since quitting: 16.1    Smokeless tobacco: Never   Substance Use Topics    Alcohol use: Never        Family History   Problem Relation Age of Onset    Cancer Mother     Heart Disease Father         Review of Systems:  (bold if positive, if negative)    Gen:  Eyes:  ENT:  CVS:  Pulm:  GI:  Abdominal pain, nausea, GU:  MS:  Skin:  Psych:  Endo:  Hem:  Renal:  Neuro:          Objective:      VITALS:    Vital signs reviewed; most recent are:    Vitals:    04/23/24 2326   BP: 129/66   Pulse: 78   Resp: 26   Temp:    SpO2: 93%     SpO2 Readings from Last 6 Encounters:   04/23/24 93%   04/17/24 97%   04/11/24 95%   02/26/24 94%   02/18/24 92%   03/19/22 96%        No intake or output data in the 24 hours ending 04/24/24 0132     Exam:     Physical Exam:    Gen:  Well-developed, well-nourished, morbidly obese, mild distress  HEENT:  Pink conjunctivae, PERRL, hearing intact to voice, moist mucous membranes  Neck:  Supple, without masses, thyroid non-tender  Resp:  No accessory muscle use, clear breath sounds without wheezes rales or rhonchi  Card:  No murmurs, normal S1, S2 without thrills, bruits or peripheral edema  Abd:  Soft, severe RUQ pain with positive Murphy's, non-distended, normoactive bowel sounds are present  Lymph:  No cervical adenopathy  Musc:  No cyanosis or clubbing  Skin:  No rashes   Neuro:  Cranial nerves 3-12 are grossly intact, follows commands appropriately  Psych:  Alert with good insight.  Oriented to person, place, and time    Labs:    Recent Labs     04/23/24  1901   WBC 5.5   HGB 10.9*   HCT 35.7   PLT 111*     Recent Labs  04/23/24  1901   NA 144   K 3.2*   CL 102   CO2 30*   BUN 18     No results found for: GLUCPOC  No results for input(s): PH, PCO2, PO2, HCO3, FIO2 in the last 72 hours.  Recent Labs     04/23/24  1901   INR 1.3*       Radiology and EKG reviewed:   prior CT scans  reviewed    **Prior records, notes, labs, radiology, and medications reviewed in [facility]**       Assessment/Plan:       Principal Problem:    63 yo hx of HTN, DM, Pafib on Eliquis , COPD, recent pancreatitis, presented w/ recurrent RUQ pain, biliary colic, GI bleed    1) Acute GI bleeding: likely due to gastropathy, Eliquis  use.  S/p recent EGD.  Might benefit from colonoscopy.  Will consult GI    2) Recurrent acute abd pain/biliary colic: will consult Gen surg to assess for lap chole.  Start IVF, IV antiemetics, IV morphine  prn severe pain    3) Anemia: likely blood loss anemia.  Check iron studies, monitor Hgb closely    4) HTN/Pafib: hold hygroten, toprol , eliquis .  Restart Toprol  once taking PO    5) DM type 2: check A1C.  Start SSI    6) Cirrhosis: seen on recent imaging.  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  Synthetic function stable.  Will need outpatient f/u     Risk of deterioration: high      Total time spent with patient care: 69 Minutes **I personally saw and examined the patient during this time period**                 Care Plan discussed with: Patient, nursing     Discussed:  Care Plan    Prophylaxis:  SCD's    Probable Disposition:  Home w/Family           ___________________________________________________    Attending Physician: Rodrick Has, MD

## 2024-04-24 NOTE — Consults (Signed)
 Surgery Consult    Subjective:      Breanna Lester is a 63 y.o. female with a history of cirrhosis, atrial fibrillation on Eliquis , hypertension, CHF, morbid obese who is currently admitted for diarrhea with rectal bleeding as well as postprandial abdominal pain.  She was admitted in August for abdominal pain and pneumonia, at that time she was seen by GI.  They performed an EGD which at that time showed some gastritis, she was discharged on PPI but has not been taking this yet.  HIDA scan was also performed which showed an ejection fraction of 0.  She reports having episodic right upper quadrant pain after fatty meals for several years, she states this has been attributed to her gallbladder.  She also complains of pain attributed to her IBS.  Past surgical history relevant for hysterectomy and C-section.     Past Medical History:   Diagnosis Date    Anxiety     Depression     Gastrointestinal disorder     Heart failure (HCC)     Hyperlipidemia     Hypertension     Liver disease 2009    liver failure    Pneumonia      Past Surgical History:   Procedure Laterality Date    ADENOIDECTOMY  1972    BACK SURGERY      cement discs following a fall    CATARACT REMOVAL      CESAREAN SECTION  1986 and 1993    DILATION AND CURETTAGE OF UTERUS  1999    EGD TRANSORAL BIOPSY SINGLE/MULTIPLE  05/17/2009         HYSTERECTOMY (CERVIX STATUS UNKNOWN)  2008    TONSILLECTOMY  1972    UPPER GASTROINTESTINAL ENDOSCOPY N/A 04/17/2024    ESOPHAGOGASTRODUODENOSCOPY performed by Madlyn Fendt, MD at [facility] ENDOSCOPY      Family History   Problem Relation Age of Onset    Cancer Mother     Heart Disease Father      Social History     Socioeconomic History    Marital status: Divorced     Spouse name: None    Number of children: None    Years of education: None    Highest education level: None   Tobacco Use    Smoking status: Former     Current packs/day: 0.00     Types: Cigarettes     Quit date: 02/28/2008     Years since quitting: 16.1    Smokeless  tobacco: Never   Vaping Use    Vaping status: Never Used   Substance and Sexual Activity    Alcohol use: Never    Drug use: Never     Social Drivers of Health     Food Insecurity: Food Insecurity Present (04/24/2024)    Hunger Vital Sign     Worried About Running Out of Food in the Last Year: Often true     Ran Out of Food in the Last Year: Often true   Transportation Needs: Unmet Transportation Needs (04/24/2024)    PRAPARE - Therapist, art (Medical): Yes     Lack of Transportation (Non-Medical): Yes   Housing Stability: High Risk (04/24/2024)    Housing Stability Vital Sign     Unable to Pay for Housing in the Last Year: Yes     Number of Times Moved in the Last Year: 6     Homeless in the Last Year: No  Current Facility-Administered Medications   Medication Dose Route Frequency    morphine  (PF) injection 2 mg  2 mg IntraVENous Q4H PRN    sodium chloride  flush 0.9 % injection 5-40 mL  5-40 mL IntraVENous 2 times per day    sodium chloride  flush 0.9 % injection 5-40 mL  5-40 mL IntraVENous PRN    0.9 % sodium chloride  infusion   IntraVENous PRN    ondansetron  (ZOFRAN ) injection 4 mg  4 mg IntraVENous Q6H PRN    polyethylene glycol (GLYCOLAX ) packet 17 g  17 g Oral Daily PRN    acetaminophen  (TYLENOL ) tablet 650 mg  650 mg Oral Q6H PRN    Or    acetaminophen  (TYLENOL ) suppository 650 mg  650 mg Rectal Q6H PRN    0.9% NaCl with KCl 20 mEq infusion   IntraVENous Continuous    pantoprazole  (PROTONIX ) 40 mg in sodium chloride  (PF) 0.9 % 10 mL injection  40 mg IntraVENous Q12H    lactobacillus (CULTURELLE) capsule 1 capsule  1 capsule Oral Daily with breakfast    insulin  lispro (HUMALOG ,ADMELOG ) injection vial 0-8 Units  0-8 Units SubCUTAneous 4x Daily AC & HS    ipratropium 0.5 mg-albuterol  2.5 mg (DUONEB ) nebulizer solution 1 Dose  1 Dose Inhalation Q4H PRN    prochlorperazine  (COMPAZINE ) injection 10 mg  10 mg IntraVENous Q6H PRN      Allergies   Allergen Reactions    Latex Rash       Review  of Systems:REVIEW OF SYSTEMS:     []      Unable to obtain  ROS due to  []     mental status change  []     sedated   []     intubated   [x]     Total of 12 systems reviewed as follows:    Constitutional: neg for fevers, chills, weight loss, malaise  Eyes: negative for blurry vision  Ears, nose, mouth, throat, and face: negative for sore throat  Respiratory: negative for SOB  Cardiovascular: negative for CP  Gastrointestinal: Per HPI  Genitourinary: negative for dysuria  Integument/breast: neg for skin rash  Hematologic/lymphatic: neg for bruising  Musculoskeletal: negative for muscle aches  Neurological: no dizziness or h/a    Objective:      Patient Vitals for the past 8 hrs:   BP Temp Temp src Pulse Resp SpO2   04/24/24 1147 -- -- -- -- 18 --   04/24/24 1146 129/66 -- -- 65 -- --   04/24/24 0733 (!) 126/45 97.9 F (36.6 C) Oral 60 17 96 %       Temp (24hrs), Avg:98.1 F (36.7 C), Min:97.9 F (36.6 C), Max:98.3 F (36.8 C)      Physical Exam:  General:  Alert, cooperative, morbidly appears stated age.   Eyes:  Conjunctivae/corneas clear.    Nose: Nares normal. Septum midline   Mouth/Throat: Lips, mucosa, and tongue normal.    Neck: Supple, symmetrical, trachea midline   Lungs:   Clear to auscultation bilaterally.   Heart:  Regular rate and rhythm   Abdomen:   + Obese abdomen, right upper quadrant tenderness with positive Murphy's, some epigastric tenderness as well, no guarding or rigidity; large umbilical hernia, it is not reducible but has not overlying skin changes and is non tender   Extremities: Extremities normal, atraumatic, no cyanosis or edema.   Skin: Skin color, texture, turgor normal. No rashes or lesions   Neuro: Alert, oriented, speech clear     Labs:  Recent Labs     04/24/24  0459   WBC 4.6   HGB 10.5*   HCT 35.6   PLT 111*     Recent Labs     04/24/24  0459   NA 142   K 3.3*   CL 103   CO2 30*   BUN 15   MG 1.7   PHOS 3.0   ALT 20     Recent Labs     04/23/24  1901   INR 1.3*         Assessment  and Plan:   63 year old female with multiple surgical risk factors including morbid obesity, cirrhosis, atrial fibrillation on Eliquis , CHF with acute on chronic biliary colic and recent HIDA scan that shows EF of 0%.  Additionally she has an acute oxygen requirement due to her recent admission for pneumonia.    Discussed with patient gallbladder anatomy and physiology, surgical versus conservative management for biliary colic.  Discussed she is high risk surgically and that before proceeding surgically she consider the risks and benefits of conservative versus operative management.  Discussed conservative management would involve diet modification or low-fat foods and optimization of medications for other causes of abdominal pain.  Will reevaluate later with attending surgeon Dr. Myrtie to come up with a plan at that time.  Patient expressed understanding of this information.             Signed By: Prentice LITTIE Hoover, PA     April 24, 2024

## 2024-04-24 NOTE — Consults (Signed)
 [name], ACNP-BC  [phone] office  [phone] voicemail   Gastroenterology Consultation Note      Admit Date: 04/23/2024  Consult Date: 04/24/2024   I greatly appreciate your asking me to see Breanna Lester, thank you very much for the opportunity to participate in her care.    Narrative Assessment and Plan   [name] is admitted with recurrent abdominal pain and diarrhea with rectal bleeding. She had a recent work up including CT, HIDA and EGD. Noted to have gallstones and low GBEF and also gastritis. She was discharged on PPI. She has continued to have RUQ pain and also return of diarrhea, which is chronic, but now associated with BRBPR. Hb 10.2, relatively stable, but down from recent 12. PLT 111. PT 16.1, INR 1.3. No leukocytosis or fever. CT shows cirrhotic liver, gallstones with inflammation and normal CBD, splenomegaly, umbilical hernia. Her MELD score is 9.    Recommendation:  Continue PPI  General Surgery is consulted, is high risk for surgery  F/U stool studies to r/o infection  She will need a colonoscopy, likely as outpatient    Thank you for the consultation.    Subjective:     Chief Complaint: Abdominal Pain and diarrhea    History of Present Illness: 63 yo F with history of HTN, CHF, afib on Eliquis , and cirrhosis is admitted with post-prandial abdominal pain and diarrhea with rectal bleeding. She was admitted last week with similar presentation. She was noted to have gallstones on imaging, but no biliary dilation and normal LFTs. HIDA showed GBEF of 0%. EGD was done and showed some gastritis. She was discharged home on PPI and diarrhea had resolved, stool studies not collected. She reports that she had been doing well until yesterday when the pain came back on severely in the RUQ, radiating to the back. She reports she has had these episodes intermittently for some time. She has in the past attributed this to IBS, seems to be diarrhea type as she does have  some chronic diarrhea. She reports that in the past several days, after resuming metformin , she has had 6-7 loose BM s per day with some BRBPR. No melena. She has been on lactulose in the past due to her liver disease. Remote h/o colonoscopy.     PCP:  Unknown, Provider, NP-C    Past Medical History:   Diagnosis Date    Anxiety     Depression     Gastrointestinal disorder     Heart failure (HCC)     Hyperlipidemia     Hypertension     Liver disease 2009    liver failure    Pneumonia         Past Surgical History:   Procedure Laterality Date    ADENOIDECTOMY  1972    BACK SURGERY      cement discs following a fall    CATARACT REMOVAL      CESAREAN SECTION  1986 and 1993    DILATION AND CURETTAGE OF UTERUS  1999    EGD TRANSORAL BIOPSY SINGLE/MULTIPLE  05/17/2009         HYSTERECTOMY (CERVIX STATUS UNKNOWN)  2008    TONSILLECTOMY  1972    UPPER GASTROINTESTINAL ENDOSCOPY N/A 04/17/2024    ESOPHAGOGASTRODUODENOSCOPY performed by Madlyn Fendt, MD at [facility] ENDOSCOPY       Social History     Tobacco Use    Smoking status: Former     Current packs/day: 0.00  Types: Cigarettes     Quit date: 02/28/2008     Years since quitting: 16.1    Smokeless tobacco: Never   Substance Use Topics    Alcohol use: Never        Family History   Problem Relation Age of Onset    Cancer Mother     Heart Disease Father         Allergies   Allergen Reactions    Latex Rash            Home Medications:  Prior to Admission Medications   Prescriptions Last Dose Informant Patient Reported? Taking?   Blood Glucose Monitoring Suppl (BLOOD GLUCOSE MONITOR SYSTEM) w/Device KIT   No No   Sig: Pharmacist to identify preferred meter and strips.   Patient not taking: Reported on 04/14/2024   Lancets 30G MISC   No No   Sig: Test 1-2 times a day & as needed for symptoms of irregular blood glucose. Dispense sufficient amount for indicated testing frequency plus additional to accommodate PRN testing needs. Pharmacist to identify preferred brand.   Patient not  taking: Reported on 04/14/2024   acetaminophen  (TYLENOL ) 325 MG tablet   No No   Sig: Take 1 tablet by mouth every 6 hours as needed for Pain   albuterol  sulfate HFA (VENTOLIN  HFA) 108 (90 Base) MCG/ACT inhaler   No No   Sig: Inhale 2 puffs into the lungs 4 times daily as needed for Wheezing   apixaban  (ELIQUIS ) 5 MG TABS tablet   Yes No   Sig: Take 1 tablet by mouth 2 times daily   blood glucose monitor strips   No No   Sig: Test 1-2 times a day & as needed for symptoms of irregular blood glucose. Dispense sufficient amount for indicated testing frequency plus additional to accommodate PRN testing needs. Pharmacist to identify preferred brand.   chlorthalidone  (HYGROTON ) 25 MG tablet   Yes No   Sig: Take 1 tablet by mouth daily   escitalopram  (LEXAPRO ) 5 MG tablet   Yes No   Sig: Take 1 tablet by mouth daily   metFORMIN  (GLUCOPHAGE -XR) 500 MG extended release tablet   No No   Sig: Take 1 tablet by mouth daily (with breakfast)   metoprolol  tartrate (LOPRESSOR ) 25 MG tablet   No No   Sig: Take 0.5 tablets by mouth 2 times daily   ondansetron  (ZOFRAN -ODT) 4 MG disintegrating tablet   No No   Sig: Take 1 tablet by mouth every 8 hours as needed for Nausea or Vomiting   pantoprazole  (PROTONIX ) 40 MG tablet   No No   Sig: Take 1 tablet by mouth every morning (before breakfast)   sertraline  (ZOLOFT ) 100 MG tablet   Yes No   Sig: Take 1 tablet by mouth daily      Facility-Administered Medications: None       Hospital Medications:  Current Facility-Administered Medications   Medication Dose Route Frequency    morphine  (PF) injection 2 mg  2 mg IntraVENous Q4H PRN    sodium chloride  flush 0.9 % injection 5-40 mL  5-40 mL IntraVENous 2 times per day    sodium chloride  flush 0.9 % injection 5-40 mL  5-40 mL IntraVENous PRN    0.9 % sodium chloride  infusion   IntraVENous PRN    ondansetron  (ZOFRAN ) injection 4 mg  4 mg IntraVENous Q6H PRN    polyethylene glycol (GLYCOLAX ) packet 17 g  17 g Oral Daily PRN  acetaminophen   (TYLENOL ) tablet 650 mg  650 mg Oral Q6H PRN    Or    acetaminophen  (TYLENOL ) suppository 650 mg  650 mg Rectal Q6H PRN    0.9% NaCl with KCl 20 mEq infusion   IntraVENous Continuous    pantoprazole  (PROTONIX ) 40 mg in sodium chloride  (PF) 0.9 % 10 mL injection  40 mg IntraVENous Q12H    lactobacillus (CULTURELLE) capsule 1 capsule  1 capsule Oral Daily with breakfast    insulin  lispro (HUMALOG ,ADMELOG ) injection vial 0-8 Units  0-8 Units SubCUTAneous 4x Daily AC & HS    ipratropium 0.5 mg-albuterol  2.5 mg (DUONEB ) nebulizer solution 1 Dose  1 Dose Inhalation Q4H PRN    prochlorperazine  (COMPAZINE ) injection 10 mg  10 mg IntraVENous Q6H PRN       Review of Systems: Admission ROS by Rodrick KATHEE Has, MD from 04/23/2024 were reviewed with the patient and changes (other than per HPI) include: none      Objective:     Physical Exam:  Vitals:    04/24/24 0733   BP: (!) 126/45   Pulse: 60   Resp: 17   Temp: 97.9 F (36.6 C)   SpO2: 96%     SpO2 Readings from Last 6 Encounters:   04/24/24 96%   04/17/24 97%   04/11/24 95%   02/26/24 94%   02/18/24 92%   03/19/22 96%          Intake/Output Summary (Last 24 hours) at 04/24/2024 1122  Last data filed at 04/24/2024 0813  Gross per 24 hour   Intake 639.77 ml   Output --   Net 639.77 ml      General: no distress, comfortable  Skin:  No rash or palpable dermatologic mass lesions  HEENT: Pupils equal, sclera anicteric, oropharynx with no gross lesions  Cardiovascular: No abnormal audible heart sounds, well perfused, no edema  Respiratory:  No abnormal audible breath sounds, normal respiratory effort, no throacic deformity  GI:  Abdomen nondistended, tender in RUQ, no mass, no free fluid, no rebound or guarding. Periumbilical hernia  Musculoskeletal:  No skeletal deformity nor acute arthritis noted.  Neurological:  Motor and sensory function intact in upper extremeties  Psychiatric:  Normal affect, memory intact, appears to have insight into current illness  Lymphatic:  No cervical,  supraclavicular, or periumbilic lymphadenopathy    Laboratory:    Recent Results (from the past 24 hours)   BMP    Collection Time: 04/23/24  7:01 PM   Result Value Ref Range    Sodium 144 136 - 145 mmol/L    Potassium 3.2 (L) 3.5 - 5.1 mmol/L    Chloride 102 98 - 107 mmol/L    CO2 30 (H) 22 - 29 mmol/L    Anion Gap 12 2 - 12 mmol/L    Glucose 146 (H) 65 - 100 mg/dL    BUN 18 8 - 23 MG/DL    Creatinine 9.21 9.49 - 0.90 MG/DL    BUN/Creatinine Ratio 23 (H) 12 - 20      Est, Glom Filt Rate 85 >60 ml/min/1.72m2    Calcium 8.8 8.8 - 10.2 MG/DL   CBC with Auto Differential    Collection Time: 04/23/24  7:01 PM   Result Value Ref Range    WBC 5.5 3.6 - 11.0 K/uL    RBC 3.99 3.80 - 5.20 M/uL    Hemoglobin 10.9 (L) 11.5 - 16.0 g/dL    Hematocrit 64.2 64.9 - 47.0 %  MCV 89.5 80.0 - 99.0 FL    MCH 27.3 26.0 - 34.0 PG    MCHC 30.5 30.0 - 36.5 g/dL    RDW 85.4 88.4 - 85.4 %    Platelets 111 (L) 150 - 400 K/uL    MPV 11.1 8.9 - 12.9 FL    Nucleated RBCs 0.0 0 PER 100 WBC    nRBC 0.00 0.00 - 0.01 K/uL    Neutrophils % 68.2 32.0 - 75.0 %    Lymphocytes % 22.9 12.0 - 49.0 %    Monocytes % 7.4 5.0 - 13.0 %    Eosinophils % 0.9 0.0 - 7.0 %    Basophils % 0.4 0.0 - 1.0 %    Immature Granulocytes % 0.2 0.0 - 0.5 %    Neutrophils Absolute 3.78 1.80 - 8.00 K/UL    Lymphocytes Absolute 1.27 0.80 - 3.50 K/UL    Monocytes Absolute 0.41 0.00 - 1.00 K/UL    Eosinophils Absolute 0.05 0.00 - 0.40 K/UL    Basophils Absolute 0.02 0.00 - 0.10 K/UL    Immature Granulocytes Absolute 0.01 0.00 - 0.04 K/UL    Differential Type AUTOMATED     Protime-INR    Collection Time: 04/23/24  7:01 PM   Result Value Ref Range    INR 1.3 (H) 0.9 - 1.1      Protime 16.1 (H) 11.9 - 14.1 sec   Lipase    Collection Time: 04/23/24  7:01 PM   Result Value Ref Range    Lipase 54 13 - 60 U/L   Occult Blood, Fecal    Collection Time: 04/23/24  7:25 PM   Result Value Ref Range    POC Occult Blood, Fecal Positive (A) NEG     POCT Glucose    Collection Time: 04/24/24  1:10  AM   Result Value Ref Range    POC Glucose 141 (H) 65 - 117 mg/dL    Performed by: Chandra Netter    CBC with Auto Differential    Collection Time: 04/24/24  4:59 AM   Result Value Ref Range    WBC 4.6 3.6 - 11.0 K/uL    RBC 3.98 3.80 - 5.20 M/uL    Hemoglobin 10.5 (L) 11.5 - 16.0 g/dL    Hematocrit 64.3 64.9 - 47.0 %    MCV 89.4 80.0 - 99.0 FL    MCH 26.4 26.0 - 34.0 PG    MCHC 29.5 (L) 30.0 - 36.5 g/dL    RDW 85.3 (H) 88.4 - 14.5 %    Platelets 111 (L) 150 - 400 K/uL    MPV 11.3 8.9 - 12.9 FL    Nucleated RBCs 0.0 0 PER 100 WBC    nRBC 0.00 0.00 - 0.01 K/uL    Neutrophils % 65.8 32.0 - 75.0 %    Lymphocytes % 23.8 12.0 - 49.0 %    Monocytes % 7.6 5.0 - 13.0 %    Eosinophils % 1.7 0.0 - 7.0 %    Basophils % 0.7 0.0 - 1.0 %    Immature Granulocytes % 0.4 0.0 - 0.5 %    Neutrophils Absolute 3.01 1.80 - 8.00 K/UL    Lymphocytes Absolute 1.09 0.80 - 3.50 K/UL    Monocytes Absolute 0.35 0.00 - 1.00 K/UL    Eosinophils Absolute 0.08 0.00 - 0.40 K/UL    Basophils Absolute 0.03 0.00 - 0.10 K/UL    Immature Granulocytes Absolute 0.02 0.00 - 0.04 K/UL    Differential Type AUTOMATED  Comprehensive Metabolic Panel    Collection Time: 04/24/24  4:59 AM   Result Value Ref Range    Sodium 142 136 - 145 mmol/L    Potassium 3.3 (L) 3.5 - 5.1 mmol/L    Chloride 103 98 - 107 mmol/L    CO2 30 (H) 20 - 29 mmol/L    Anion Gap 9 2 - 14 mmol/L    Glucose 134 (H) 65 - 100 mg/dL    BUN 15 8 - 23 MG/DL    Creatinine 9.19 9.39 - 1.00 MG/DL    BUN/Creatinine Ratio 19 12 - 20      Est, Glom Filt Rate 83 >59 ml/min/1.3m2    Calcium 8.7 (L) 8.8 - 10.2 MG/DL    Total Bilirubin 0.6 0.0 - 1.2 MG/DL    ALT 20 10 - 35 U/L    AST 28 10 - 35 U/L    Alk Phosphatase 97 35 - 104 U/L    Total Protein 6.1 (L) 6.4 - 8.3 g/dL    Albumin 2.5 (L) 3.5 - 5.2 g/dL    Globulin 3.5 2.0 - 4.0 g/dL    Albumin/Globulin Ratio 0.7 (L) 1.1 - 2.2     Lactic Acid    Collection Time: 04/24/24  4:59 AM   Result Value Ref Range    Lactic Acid 1.0 0.5 - 2.0 mmol/L    Lipase    Collection Time: 04/24/24  4:59 AM   Result Value Ref Range    Lipase 40 13 - 60 U/L   Magnesium     Collection Time: 04/24/24  4:59 AM   Result Value Ref Range    Magnesium  1.7 1.6 - 2.4 mg/dL   Phosphorus    Collection Time: 04/24/24  4:59 AM   Result Value Ref Range    Phosphorus 3.0 2.5 - 4.5 MG/DL   Procalcitonin    Collection Time: 04/24/24  4:59 AM   Result Value Ref Range    Procalcitonin 0.08 ng/mL   C-Reactive Protein    Collection Time: 04/24/24  4:59 AM   Result Value Ref Range    CRP 3.3 (H) 0.0 - 0.5 mg/dL   Ferritin    Collection Time: 04/24/24  4:59 AM   Result Value Ref Range    Ferritin 87 13 - 252 NG/ML   Folate    Collection Time: 04/24/24  4:59 AM   Result Value Ref Range    Folate 14.2 4.8 - 24.2 ng/mL   Hemoglobin A1C    Collection Time: 04/24/24  4:59 AM   Result Value Ref Range    Hemoglobin A1C 8.2 (H) 4.0 - 5.6 %    Estimated Avg Glucose 188 mg/dL   Vitamin B12    Collection Time: 04/24/24  4:59 AM   Result Value Ref Range    Vitamin B-12 795 232 - 1245 pg/mL   POCT Glucose    Collection Time: 04/24/24  7:39 AM   Result Value Ref Range    POC Glucose 132 (H) 65 - 117 mg/dL    Performed by: Vicci Browning          Assessment/Plan:     Principal Problem:    Acute GI bleeding  Active Problems:    Biliary colic  Resolved Problems:    * No resolved hospital problems. *       See above narrative for full detail.

## 2024-04-25 LAB — CBC WITH AUTO DIFFERENTIAL
Basophils %: 0.6 % (ref 0.0–1.0)
Basophils Absolute: 0.03 K/UL (ref 0.00–0.10)
Eosinophils %: 1.4 % (ref 0.0–7.0)
Eosinophils Absolute: 0.07 K/UL (ref 0.00–0.40)
Hematocrit: 36.5 % (ref 35.0–47.0)
Hemoglobin: 10.8 g/dL — ABNORMAL LOW (ref 11.5–16.0)
Immature Granulocytes %: 0.8 % — ABNORMAL HIGH (ref 0.0–0.5)
Immature Granulocytes Absolute: 0.04 K/UL (ref 0.00–0.04)
Lymphocytes %: 24.8 % (ref 12.0–49.0)
Lymphocytes Absolute: 1.22 K/UL (ref 0.80–3.50)
MCH: 27 pg (ref 26.0–34.0)
MCHC: 29.6 g/dL — ABNORMAL LOW (ref 30.0–36.5)
MCV: 91.3 FL (ref 80.0–99.0)
MPV: 11 FL (ref 8.9–12.9)
Monocytes %: 6.9 % (ref 5.0–13.0)
Monocytes Absolute: 0.34 K/UL (ref 0.00–1.00)
Neutrophils %: 65.5 % (ref 32.0–75.0)
Neutrophils Absolute: 3.22 K/UL (ref 1.80–8.00)
Nucleated RBCs: 0 /100{WBCs}
Platelets: 105 K/uL — ABNORMAL LOW (ref 150–400)
RBC: 4 M/uL (ref 3.80–5.20)
RDW: 14.6 % — ABNORMAL HIGH (ref 11.5–14.5)
WBC: 4.9 K/uL (ref 3.6–11.0)
nRBC: 0 K/uL (ref 0.00–0.01)

## 2024-04-25 LAB — IRON AND TIBC
Iron % Saturation: 14 %
Iron: 40 ug/dL (ref 37–145)
TIBC: 292 ug/dL (ref 250–450)
UIBC: 252 ug/dL (ref 112.0–347.0)

## 2024-04-25 LAB — BASIC METABOLIC PANEL
Anion Gap: 12 mmol/L (ref 2–14)
BUN/Creatinine Ratio: 21 — ABNORMAL HIGH (ref 12–20)
BUN: 16 mg/dL (ref 8–23)
CO2: 27 mmol/L (ref 20–29)
Calcium: 8.3 mg/dL — ABNORMAL LOW (ref 8.8–10.2)
Chloride: 102 mmol/L (ref 98–107)
Creatinine: 0.78 mg/dL (ref 0.60–1.00)
Est, Glom Filt Rate: 85 ml/min/1.73m2 (ref >59)
Glucose: 180 mg/dL — ABNORMAL HIGH (ref 65–100)
Potassium: 3.7 mmol/L (ref 3.5–5.1)
Sodium: 140 mmol/L (ref 136–145)

## 2024-04-25 LAB — POCT GLUCOSE
POC Glucose: 133 mg/dL — ABNORMAL HIGH (ref 65–117)
POC Glucose: 120 mg/dL — ABNORMAL HIGH (ref 65–117)

## 2024-04-25 LAB — MAGNESIUM: Magnesium: 1.7 mg/dL (ref 1.6–2.4)

## 2024-04-25 LAB — HEMOGLOBIN AND HEMATOCRIT
Hematocrit: 34.6 % — ABNORMAL LOW (ref 35.0–47.0)
Hemoglobin: 10.4 g/dL — ABNORMAL LOW (ref 11.5–16.0)

## 2024-04-25 MED ORDER — CULTURELLE PO CAPS
ORAL_CAPSULE | Freq: Every day | ORAL | 0 refills | 30.00000 days | Status: AC
Start: 2024-04-25 — End: ?

## 2024-04-25 MED ORDER — METFORMIN HCL ER 500 MG PO TB24
500 | ORAL_TABLET | Freq: Two times a day (BID) | ORAL | 0 refills | 30.00000 days | Status: DC
Start: 2024-04-25 — End: 2024-04-25

## 2024-04-25 MED ORDER — SERTRALINE HCL 50 MG PO TABS
50 | Freq: Every day | ORAL | Status: DC
Start: 2024-04-25 — End: 2024-04-25
  Administered 2024-04-25: 14:00:00 100 mg via ORAL

## 2024-04-25 MED FILL — PANTOPRAZOLE SODIUM 40 MG IV SOLR: 40 mg | INTRAVENOUS | Qty: 40

## 2024-04-25 MED FILL — MORPHINE SULFATE 2 MG/ML IJ SOLN: 2 mg/mL | INTRAMUSCULAR | Qty: 1 | Fill #0

## 2024-04-25 MED FILL — INSULIN LISPRO 100 UNIT/ML IJ SOLN: 100 [IU]/mL | INTRAMUSCULAR | Qty: 2

## 2024-04-25 MED FILL — SERTRALINE HCL 50 MG PO TABS: 50 mg | ORAL | Qty: 2 | Fill #0

## 2024-04-25 MED FILL — AMOXICILLIN 250 MG PO CAPS: 250 mg | ORAL | Qty: 4 | Fill #0

## 2024-04-25 NOTE — Discharge Instructions (Addendum)
 HOSPITALIST DISCHARGE INSTRUCTIONS  NAME:  Breanna Lester   DOB:  03-29-1961   MRN:  [id number]     Date/Time:  04/25/2024 11:32 AM    ADMIT DATE: 04/23/2024     DISCHARGE DATE: 04/25/2024     DISCHARGE DIAGNOSIS:  Biliary colic    DISCHARGE INSTRUCTIONS:  Thank you for allowing us  to participate in your care. Your discharging Hospitalist is Librarian, academic, APRN - CNP. You were admitted for evaluation and treatment of the above.  You were admitted to the hospital with abdominal pain.  You were evaluated by our GI and general surgery teams.  It is determined that removing her gallbladder is too high risk at this time and you may follow-up with the general surgery team as an outpatient.  Please also follow-up with the GI team as an outpatient for a colonoscopy.  Our case management team is to set up a PCP appointment for you.  Please complete your course of antibiotics for your pneumonia as well. Your metformin  will be stopped due to your cirrhosis, it is recommended you make healthy lifestyle choices/dietary changes and keep track of your blood glucose to report to PCP.      MEDICATIONS:    It is important that you take the medication exactly as they are prescribed.   Keep your medication in the bottles provided by the pharmacist and keep a list of the medication names, dosages, and times to be taken in your wallet.   Do not take other medications without consulting your doctor.             If you experience any of the following symptoms then please call your primary care physician or return to the emergency room if you cannot get hold of your doctor:  Fever, chills, nausea, vomiting, diarrhea, change in mentation, falling, bleeding, shortness of breath    Follow Up:  Please call the below provider to arrange hospital follow up appointment      No follow-up provider specified.    For questions regarding your Hospitalization or to contact the Hospital Medicine team, please call [phone]       Information obtained by  :  I understand that if any problems occur once I am at home I am to contact my physician.    I understand and acknowledge receipt of the instructions indicated above.                                                                                                                                           Physician's or R.N.'s Signature  Date/Time                                                                                                                                              Patient or Representative Signature                                                          Date/Time

## 2024-04-25 NOTE — Plan of Care (Signed)
 Problem: Chronic Conditions and Co-morbidities  Goal: Patient's chronic conditions and co-morbidity symptoms are monitored and maintained or improved  04/25/2024 1334 by Steen Johnnye PARAS, RN  Outcome: Progressing     Problem: Discharge Planning  Goal: Discharge to home or other facility with appropriate resources  04/25/2024 1334 by Steen Johnnye PARAS, RN  Outcome: Progressing     Problem: Safety - Adult  Goal: Free from fall injury  04/25/2024 1334 by Steen Johnnye PARAS, RN  Outcome: Progressing     Problem: Pain  Goal: Verbalizes/displays adequate comfort level or baseline comfort level  04/25/2024 1334 by Steen Johnnye PARAS, RN  Outcome: Progressing

## 2024-04-25 NOTE — Progress Notes (Signed)
 New patient PCP appointment set for Monday September 15th 2025 at 11:00 AM with Doctor Kieth at their [facility]

## 2024-04-25 NOTE — Progress Notes (Signed)
 [name], ACNP-BC  [phone] office  [phone] voicemail     Gastroenterology Progress Note    April 25, 2024  Admit Date: 04/23/2024         Narrative Assessment and Plan   63 yo F with history of NASH cirrhosis admitted with recurrent abdominal pain and diarrhea with rectal bleeding. She had a recent work up including CT, HIDA and EGD. Noted to have gallstones and low GBEF and also gastritis. She was discharged on PPI. She has continued to have RUQ pain and also return of diarrhea, which is chronic, but now associated with BRBPR.     9/5 - Hb 10.5, stable. Enteric panel is pending. Surgery following and plans conservative approach to biliary dyskinesia given high risk for surgery.    Recommend:  F/U stool studies  Continue PPI  Diet as tolerated  Outpatient colonoscopy  Will see if called over the weekend   [facility] to d/c home if tolerating PO and pain controlled    Subjective:     Pain improved, controlled with analgia. Mild nausea, but no vomiting. No further rectal bleeding. Diarrhea has slowed.       ROS:  The previous review of systems on initial consultation / H&P is noted and reviewed.  Specific changes noted above in HPI.    Current Medications:     Current Facility-Administered Medications   Medication Dose Route Frequency    sertraline  (ZOLOFT ) tablet 100 mg  100 mg Oral Daily    morphine  (PF) injection 2 mg  2 mg IntraVENous Q4H PRN    amoxicillin  (AMOXIL ) capsule 1,000 mg  1,000 mg Oral 3 times per day    sodium chloride  flush 0.9 % injection 5-40 mL  5-40 mL IntraVENous 2 times per day    sodium chloride  flush 0.9 % injection 5-40 mL  5-40 mL IntraVENous PRN    0.9 % sodium chloride  infusion   IntraVENous PRN    ondansetron  (ZOFRAN ) injection 4 mg  4 mg IntraVENous Q6H PRN    polyethylene glycol (GLYCOLAX ) packet 17 g  17 g Oral Daily PRN    acetaminophen  (TYLENOL ) tablet 650 mg  650 mg Oral Q6H PRN    Or    acetaminophen  (TYLENOL ) suppository 650 mg  650 mg Rectal Q6H  PRN    0.9% NaCl with KCl 20 mEq infusion   IntraVENous Continuous    pantoprazole  (PROTONIX ) 40 mg in sodium chloride  (PF) 0.9 % 10 mL injection  40 mg IntraVENous Q12H    lactobacillus (CULTURELLE) capsule 1 capsule  1 capsule Oral Daily with breakfast    insulin  lispro (HUMALOG ,ADMELOG ) injection vial 0-8 Units  0-8 Units SubCUTAneous 4x Daily AC & HS    ipratropium 0.5 mg-albuterol  2.5 mg (DUONEB ) nebulizer solution 1 Dose  1 Dose Inhalation Q4H PRN    prochlorperazine  (COMPAZINE ) injection 10 mg  10 mg IntraVENous Q6H PRN       Objective:     VITALS:   Last 24hrs VS reviewed since prior progress note. Most recent are:  Vitals:    04/25/24 0916   BP:    Pulse:    Resp: 16   Temp:    SpO2:      Temp (24hrs), Avg:98.6 F (37 C), Min:98.1 F (36.7 C), Max:99.1 F (37.3 C)      Intake/Output Summary (Last 24 hours) at 04/25/2024 1046  Last data filed at 04/24/2024 1632  Gross per 24 hour   Intake 267.63 ml  Output --   Net 267.63 ml       EXAM:  General:  Comfortable, no distress    HEENT:  Atraumatic skull, pupils equal  Lungs:   No abnormal audible breath sounds.  Speaking in complete sentences  Heart:   No abnormal audible heart sounds.  Well perfused  Abdomen:  Non-distended, non-tender.  No mass, guarding or rebound  Musc:   No skeletal defects or deformities  Neurologic:   Cranial nerves grossly intact, moves all 4 extremities  Psych:    Good insight. Not anxious nor agitated  Derm:   No rash, jaundice, nor palpable dermatologic mass    Lab Data Reviewed:   Recent Labs     04/23/24  1901 04/24/24  0459 04/24/24  1721 04/25/24  0014 04/25/24  0900   WBC 5.5 4.6  --   --  4.9   HGB 10.9* 10.5* 11.7 10.4* 10.8*   HCT 35.7 35.6 39.0 34.6* 36.5   PLT 111* 111*  --   --  105*     Recent Labs     04/23/24  1901 04/24/24  0459 04/25/24  0900   NA 144 142 140   K 3.2* 3.3* 3.7   CL 102 103 102   CO2 30* 30* 27   BUN 18 15 16    MG  --  1.7 1.7   PHOS  --  3.0  --    ALT  --  20  --    INR 1.3*  --   --      No results  found for: GLUCPOC  No results for input(s): PH, PCO2, PO2, HCO3, FIO2 in the last 72 hours.  Recent Labs     04/23/24  1901   INR 1.3*           Assessment:   (See above)  Principal Problem:    Acute GI bleeding  Active Problems:    Biliary colic    Diarrhea  Resolved Problems:    * No resolved hospital problems. *      Plan:   (See above)      Signed By: Lauraine Marcus, APRN - NP     04/25/2024  10:46 AM

## 2024-04-25 NOTE — Care Coordination-Inpatient (Signed)
 Case Management Discharge Note:    Discharge Plan:  Patient discharging to home today with the following services:    CM met with patient at bedside to discuss discharge planning updates.  Patient stated no other needs.  Medical treatment team informed of updates.      (1)  DME:  Portable O2 tank delivered at bedside by [employer] [facility].  Patient is open to their services for her home O2.      (2)  Transportation:  Roundtrip arranged.  Pick-up time:  1330    DC Address: (Patient has her motel key)  [facility] in [place]  [address] [place], [place], [place] [address]   Room:  [address]    (3)  New PCP Appt:  (On AVS).  05/05/2024 at 1100 with Dr. Kieth at their [place] location.        ______________________________________  Creighton Mulling, BSN, RN-CM  Shelvy Gwenn Thresa Bernardino- Care Management  Available via [employer]  04/25/2024  12:43 PM

## 2024-04-25 NOTE — Discharge Summary (Signed)
 Hospitalist Discharge Summary     Patient ID:  Breanna Lester  [id number]  63 y.o.  02-07-1961    Admit date: 04/23/2024    Discharge date and time: 04/25/2024    Admission Diagnoses: Bloody stool [K92.1]  Acute GI bleeding [K92.2]  Diarrhea, unspecified type [R19.7]    Discharge Diagnoses:    Present on Admission:   Acute GI bleeding   Biliary colic   Diarrhea     Patient seen this morning resting at edge of bed with nursing at bedside.  Patient reports no new complaints, ongoing epigastric pain which is unchanged.  Patient reports she was given oxycodone  on last discharge however is not taking as she is attempting to avoid narcotics.  She reports she is comfortable going home today, she does have what she needs as far as oxygen is concerned.  Reports that she does have an inhaler that she just refilled that works well for her intermittent shortness of breath.  Discussed with her to finish her course of antibiotics for her pneumonia and follow-up outpatient with GI/general surgery/PCP to which she agrees.    Hospital Course:     Acute GI bleeding: likely due to gastropathy, Eliquis  use.  S/p recent EGD. GI has evaluated, rec continue PPI, likely o/p colonoscopy. Recommend avoiding NSAIDs, d/w pt     Recurrent acute abd pain/biliary colic: Gen surg has evaluated. Pt high risk for surgery considering comorbidities, O2 requirement, RCRI 2. To follow up o/p for surgical evaluation once pneumonia has resolved.     Anemia: likely blood loss anemia.  Iron studies pending, monitor Hgb closely. OK to resume Eliquis , d/w GI PA Sarah     Recent CAP with new O2 requirement. Completed Azithromycin . D/c on amoxicillin  500mg /d last admit, to finish course.     HTN/Pafib: Resume home medications     Hypokalemia Resolved s/p repletion.     Metabolic alkalosis, bicarb 30. Likely 2/2 recent GI losses/chlorthalidone  use, pt denies recent antacid use. Resolved w holding chlorthalidone , cont to hold at d/c.     DM type 2: A1C 8.2. 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 Concerns for possible pancreatitis last admission making GLP1RA not an option. Cost concerns w SGLT2i. May require insulin /teaching at d/c.     Cirrhosis: seen on recent imaging.  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  Synthetic function stable.  Will need outpatient f/u in [place] (from [place])     COPD/Underlying OSA/OHS suspected. Cont PRN SABA, recommend o/p f/u w pulm/sleep study    Imaging  CT ABDOMEN PELVIS W IV CONTRAST Additional Contrast? None  Result Date: 04/23/2024  Cirrhosis with fat-containing umbilical hernia and surrounding edema in the abdominal wall. Cholelithiasis without gallbladder inflammation. Epigastric mesenteric edema without significant ascites. No small or large bowel inflammation. Electronically signed by Arlyn Seen       PCP: Unknown, Provider, NP-C     Consults: GI and general surgery    Condition of patient at discharge: Good and Independent    Discharge Exam:    Physical Exam:    Gen:  Obese, chronically ill appearing, in no acute distress  HEENT:  Pink conjunctivae, hearing intact to voice, moist mucous membranes  Neck:  Supple, without masses, thyroid non-tender  Resp:  No accessory muscle use, clear breath sounds without wheezes rales or rhonchi  Card:  S1, S2 without thrills, bruits or peripheral edema  Abd:  Soft, TTP to all quadrants,  pannus, normoactive bowel sounds are present  Musc:  No cyanosis or clubbing  Skin:  No rashes or ulcers, skin turgor is good. + jaundice  Neuro:  Cranial nerves 3-12 are grossly intact, follows commands appropriately  Psych:  Good insight, oriented to person, place and time, alert    Disposition: home    Patient Instructions:      Medication List        START taking these medications      lactobacillus capsule  Take 1 capsule by mouth daily (with breakfast)  Start taking on: April 26, 2024            CHANGE how you take these medications       apixaban  5 MG Tabs tablet  Commonly known as: ELIQUIS   What changed: when to take this     metFORMIN  500 MG extended release tablet  Commonly known as: GLUCOPHAGE -XR  Take 1 tablet by mouth with breakfast and with evening meal  What changed: when to take this            CONTINUE taking these medications      acetaminophen  325 MG tablet  Commonly known as: TYLENOL   Take 1 tablet by mouth every 6 hours as needed for Pain     albuterol  sulfate HFA 108 (90 Base) MCG/ACT inhaler  Commonly known as: Ventolin  HFA  Inhale 2 puffs into the lungs 4 times daily as needed for Wheezing     amoxicillin  500 MG capsule  Commonly known as: AMOXIL      Blood Glucose Monitor System w/Device Kit  Pharmacist to identify preferred meter and strips.     blood glucose test strips  Test 1-2 times a day & as needed for symptoms of irregular blood glucose. Dispense sufficient amount for indicated testing frequency plus additional to accommodate PRN testing needs. Pharmacist to identify preferred brand.     Lancets 30G Misc  Test 1-2 times a day & as needed for symptoms of irregular blood glucose. Dispense sufficient amount for indicated testing frequency plus additional to accommodate PRN testing needs. Pharmacist to identify preferred brand.     metoprolol  tartrate 25 MG tablet  Commonly known as: LOPRESSOR   Take 0.5 tablets by mouth 2 times daily     ondansetron  4 MG disintegrating tablet  Commonly known as: ZOFRAN -ODT  Take 1 tablet by mouth every 8 hours as needed for Nausea or Vomiting     pantoprazole  40 MG tablet  Commonly known as: PROTONIX   Take 1 tablet by mouth every morning (before breakfast)     sertraline  100 MG tablet  Commonly known as: ZOLOFT             STOP taking these medications      chlorthalidone  25 MG tablet  Commonly known as: HYGROTON      escitalopram  5 MG tablet  Commonly known as: LEXAPRO                Where to Get Your Medications        These medications were sent to [facility] [id number] [place], [place] -  [address] [phone] - F [phone]  [address] [place] [place] [address]      Phone: [phone]   lactobacillus capsule  metFORMIN  500 MG extended release tablet       Current Discharge Medication List        CONTINUE these medications which have NOT CHANGED    Details   amoxicillin  (AMOXIL )  500 MG capsule Take 2 capsules by mouth 3 times daily For 4 days, pt hasn't finished yet      ondansetron  (ZOFRAN -ODT) 4 MG disintegrating tablet Take 1 tablet by mouth every 8 hours as needed for Nausea or Vomiting  Qty: 30 tablet, Refills: 0      pantoprazole  (PROTONIX ) 40 MG tablet Take 1 tablet by mouth every morning (before breakfast)  Qty: 60 tablet, Refills: 0      metoprolol  tartrate (LOPRESSOR ) 25 MG tablet Take 0.5 tablets by mouth 2 times daily  Qty: 30 tablet, Refills: 1      albuterol  sulfate HFA (VENTOLIN  HFA) 108 (90 Base) MCG/ACT inhaler Inhale 2 puffs into the lungs 4 times daily as needed for Wheezing  Qty: 18 g, Refills: 0      Blood Glucose Monitoring Suppl (BLOOD GLUCOSE MONITOR SYSTEM) w/Device KIT Pharmacist to identify preferred meter and strips.  Qty: 1 kit, Refills: 0      blood glucose monitor strips Test 1-2 times a day & as needed for symptoms of irregular blood glucose. Dispense sufficient amount for indicated testing frequency plus additional to accommodate PRN testing needs. Pharmacist to identify preferred brand.  Qty: 100 strip, Refills: 2      Lancets 30G MISC Test 1-2 times a day & as needed for symptoms of irregular blood glucose. Dispense sufficient amount for indicated testing frequency plus additional to accommodate PRN testing needs. Pharmacist to identify preferred brand.  Qty: 100 each, Refills: 2      sertraline  (ZOLOFT ) 100 MG tablet Take 1 tablet by mouth daily      apixaban  (ELIQUIS ) 5 MG TABS tablet Take 1 tablet by mouth 2 times daily      acetaminophen  (TYLENOL ) 325 MG tablet Take 1 tablet by mouth every 6 hours as needed for Pain  Qty: 40  tablet, Refills: 0              Activity: activity as tolerated  Diet: low fat, low cholesterol diet    Follow-up with Unknown, Provider, NP-C in 1 week.    Approximate time spent in patient care on day of discharge: >46m    Signed:  Jeoffrey LITTIE New, APRN - CNP  04/25/2024  11:39 AM

## 2024-04-25 NOTE — Progress Notes (Signed)
 Patient called out around 0710 reporting SOB/dyspnea. Temporarily bumped patient up from 2 L NC to 3 L NC. Obtained O2 which was 97%. Will pass off to day nurse in bedside shift report.

## 2024-04-25 NOTE — Progress Notes (Signed)
 General Surgery Daily Progress Note    Patient: Breanna Lester MRN: [id number]  SSN: [id number]    Date of Birth: [birth year]  Age: 63 y.o.  Sex: female      Admit Date: 04/23/2024    POD * No surgery found *    Procedure: * No surgery found *    Subjective:   No acute complaints.  Did not like the [place] toast served for breakfast so did not eat much, otherwise has been tolerating diet.  Still has acute on chronic abdominal pain unchanged from yesterday.  Diarrhea resolving.     Current Facility-Administered Medications   Medication Dose Route Frequency    sertraline  (ZOLOFT ) tablet 100 mg  100 mg Oral Daily    morphine  (PF) injection 2 mg  2 mg IntraVENous Q4H PRN    amoxicillin  (AMOXIL ) capsule 1,000 mg  1,000 mg Oral 3 times per day    sodium chloride  flush 0.9 % injection 5-40 mL  5-40 mL IntraVENous 2 times per day    sodium chloride  flush 0.9 % injection 5-40 mL  5-40 mL IntraVENous PRN    0.9 % sodium chloride  infusion   IntraVENous PRN    ondansetron  (ZOFRAN ) injection 4 mg  4 mg IntraVENous Q6H PRN    polyethylene glycol (GLYCOLAX ) packet 17 g  17 g Oral Daily PRN    acetaminophen  (TYLENOL ) tablet 650 mg  650 mg Oral Q6H PRN    Or    acetaminophen  (TYLENOL ) suppository 650 mg  650 mg Rectal Q6H PRN    0.9% NaCl with KCl 20 mEq infusion   IntraVENous Continuous    pantoprazole  (PROTONIX ) 40 mg in sodium chloride  (PF) 0.9 % 10 mL injection  40 mg IntraVENous Q12H    lactobacillus (CULTURELLE) capsule 1 capsule  1 capsule Oral Daily with breakfast    insulin  lispro (HUMALOG ,ADMELOG ) injection vial 0-8 Units  0-8 Units SubCUTAneous 4x Daily AC & HS    ipratropium 0.5 mg-albuterol  2.5 mg (DUONEB ) nebulizer solution 1 Dose  1 Dose Inhalation Q4H PRN    prochlorperazine  (COMPAZINE ) injection 10 mg  10 mg IntraVENous Q6H PRN        Objective:   No intake/output data recorded.  09/03 1901 - 09/05 0700  In: 907.4 [I.V.:907.4]  Out: -   Patient Vitals for the past 8 hrs:   BP Temp Temp src Pulse Resp SpO2   04/25/24 0916  -- -- -- -- 16 --   04/25/24 0750 -- -- -- 66 18 96 %   04/25/24 0728 138/74 98.1 F (36.7 C) Oral 65 15 97 %       Physical Exam:  General: Alert, cooperative, NAD  Lungs: Unlabored  Abdomen: Softt obese abdomen, she has multiple points of tenderness including her right flank, right upper quadrant, epigastrium and periumbilical area, it is not focal to the gallbladder; no guarding or rigidity.   Extremities: Warm, moves all, no edema  Skin:  Warm and dry, no rash    Labs:   Recent Labs     04/25/24  0900   WBC 4.9   HGB 10.8*   HCT 36.5   PLT 105*     Recent Labs     04/24/24  0459 04/25/24  0900   NA 142 140   K 3.3* 3.7   CL 103 102   CO2 30* 27   BUN 15 16   MG 1.7 1.7   PHOS 3.0  --    ALT 20  --  Assessment / Plan:     POD * No surgery found *    Procedure: * No surgery found *  She has acute on chronic abdominal complaints, otherwise tolerating diet.  She has no concerning vital sign changes, leukocytosis, or any findings on her recent imaging to correlate with acute cholecystitis.  I suspect her symptoms are multifactorial to include her biliary colic.  Regardless, I reviewed that she is high risk for surgery and at this point we are likely to cause her more harm than good with the surgical intervention.  Recommended continued conservative management with low-fat diet and medical optimization the other sources of her abdominal plain to include gastritis, IBS, obesity, etc. no surgical intervention planned at this time.  ther questions.  She is welcome to follow-up with general surgery clinic should her symptoms change as an outpatient.               Prentice LITTIE Hoover, PA  [facility] Surgical Specialist at [facility]  [address]   [address]  P: [phone]

## 2024-04-26 LAB — ENTERIC BACT PANEL, DNA
CAMPYLOBACTER SPECIES, DNA: NEGATIVE
ENTEROTOXIGEN E COLI, DNA: NEGATIVE
P. SHIGELLOIDES, DNA: NEGATIVE
SALMONELLA SPECIES, DNA: NEGATIVE
SHIGA TOXIN PRODUCING, DNA: NEGATIVE
SHIGELLA SPECIES, DNA: NEGATIVE
VIBRIO SPECIES, DNA: NEGATIVE
Y. ENTEROCOLITICA, DNA: NEGATIVE

## 2024-05-02 ENCOUNTER — Emergency Department: Admit: 2024-05-02 | Payer: Medicare (Managed Care) | Primary: Diagnostic Radiology

## 2024-05-02 ENCOUNTER — Inpatient Hospital Stay
Admission: EM | Admit: 2024-05-02 | Discharge: 2024-05-09 | Disposition: A | Discharge status: Home / Self Care | Payer: Medicare (Managed Care) | Admitting: Student in an Organized Health Care Education/Training Program

## 2024-05-02 ENCOUNTER — Inpatient Hospital Stay: Admit: 2024-05-03 | Payer: Medicare (Managed Care) | Primary: Diagnostic Radiology

## 2024-05-02 DIAGNOSIS — M7989 Other specified soft tissue disorders: Principal | ICD-10-CM

## 2024-05-02 DIAGNOSIS — J441 Chronic obstructive pulmonary disease with (acute) exacerbation: Secondary | ICD-10-CM

## 2024-05-02 LAB — CBC WITH AUTO DIFFERENTIAL
Basophils %: 0.4 % (ref 0.0–1.0)
Basophils Absolute: 0.02 K/UL (ref 0.00–0.10)
Eosinophils %: 1.2 % (ref 0.0–7.0)
Eosinophils Absolute: 0.06 K/UL (ref 0.00–0.40)
Hematocrit: 33.9 % — ABNORMAL LOW (ref 35.0–47.0)
Hemoglobin: 10.1 g/dL — ABNORMAL LOW (ref 11.5–16.0)
Immature Granulocytes %: 0.2 % (ref 0.0–0.5)
Immature Granulocytes Absolute: 0.01 K/UL (ref 0.00–0.04)
Lymphocytes %: 13.3 % (ref 12.0–49.0)
Lymphocytes Absolute: 0.67 K/UL — ABNORMAL LOW (ref 0.80–3.50)
MCH: 26.9 pg (ref 26.0–34.0)
MCHC: 29.8 g/dL — ABNORMAL LOW (ref 30.0–36.5)
MCV: 90.2 FL (ref 80.0–99.0)
MPV: 12 FL (ref 8.9–12.9)
Monocytes %: 9.1 % (ref 5.0–13.0)
Monocytes Absolute: 0.46 K/UL (ref 0.00–1.00)
Neutrophils %: 75.8 % — ABNORMAL HIGH (ref 32.0–75.0)
Neutrophils Absolute: 3.78 K/UL (ref 1.80–8.00)
Nucleated RBCs: 0 /100{WBCs}
Platelets: 109 K/uL — ABNORMAL LOW (ref 150–400)
RBC: 3.76 M/uL — ABNORMAL LOW (ref 3.80–5.20)
RDW: 15.2 % — ABNORMAL HIGH (ref 11.5–14.5)
WBC: 5 K/uL (ref 3.6–11.0)
nRBC: 0 K/uL (ref 0.00–0.01)

## 2024-05-02 LAB — COMPREHENSIVE METABOLIC PANEL
ALT: 12 U/L (ref 10–35)
AST: 39 U/L — ABNORMAL HIGH (ref 10–35)
Albumin/Globulin Ratio: 1.1 (ref 1.1–2.2)
Albumin: 3.3 g/dL — ABNORMAL LOW (ref 3.5–5.2)
Alk Phosphatase: 97 U/L (ref 35–104)
Anion Gap: 13 mmol/L — ABNORMAL HIGH (ref 2–12)
BUN/Creatinine Ratio: 27 — ABNORMAL HIGH (ref 12–20)
BUN: 21 mg/dL (ref 8–23)
CO2: 26 mmol/L (ref 22–29)
Calcium: 8.6 mg/dL — ABNORMAL LOW (ref 8.8–10.2)
Chloride: 105 mmol/L (ref 98–107)
Creatinine: 0.78 mg/dL (ref 0.50–0.90)
Est, Glom Filt Rate: 85 ml/min/1.73m2 (ref >60)
Globulin: 3 g/dL (ref 2.0–4.0)
Glucose: 143 mg/dL — ABNORMAL HIGH (ref 65–100)
Potassium: 3.6 mmol/L (ref 3.5–5.1)
Sodium: 144 mmol/L (ref 136–145)
Total Bilirubin: 0.6 mg/dL (ref 0.0–1.2)
Total Protein: 6.3 g/dL — ABNORMAL LOW (ref 6.4–8.3)

## 2024-05-02 LAB — BRAIN NATRIURETIC PEPTIDE: NT Pro-BNP: 3447 pg/mL — ABNORMAL HIGH (ref <126)

## 2024-05-02 LAB — COVID-19 & INFLUENZA COMBO
Rapid Influenza A By PCR: NOT DETECTED
Rapid Influenza B By PCR: NOT DETECTED
SARS-CoV-2, PCR: NOT DETECTED

## 2024-05-02 LAB — TROPONIN
Troponin T: 14.8 ng/L — ABNORMAL HIGH (ref 0–14)
Troponin T: 15.7 ng/L — ABNORMAL HIGH (ref 0–14)

## 2024-05-02 LAB — EXTRA TUBES HOLD

## 2024-05-02 MED ORDER — ONDANSETRON 4 MG PO TBDP
4 | Freq: Three times a day (TID) | ORAL | Status: DC | PRN
Start: 2024-05-02 — End: 2024-05-09

## 2024-05-02 MED ORDER — BENZONATATE 100 MG PO CAPS
100 | Freq: Three times a day (TID) | ORAL | Status: DC | PRN
Start: 2024-05-02 — End: 2024-05-09
  Administered 2024-05-06: 01:00:00 100 mg via ORAL

## 2024-05-02 MED ORDER — NORMAL SALINE FLUSH 0.9 % IV SOLN
0.9 | INTRAVENOUS | Status: DC | PRN
Start: 2024-05-02 — End: 2024-05-09

## 2024-05-02 MED ORDER — IPRATROPIUM-ALBUTEROL 0.5-2.5 (3) MG/3ML IN SOLN
0.5-2.5 | RESPIRATORY_TRACT | Status: DC
Start: 2024-05-02 — End: 2024-05-02
  Administered 2024-05-02: 23:00:00 1 via RESPIRATORY_TRACT

## 2024-05-02 MED ORDER — ACETAMINOPHEN 650 MG RE SUPP
650 | Freq: Four times a day (QID) | RECTAL | Status: DC | PRN
Start: 2024-05-02 — End: 2024-05-09

## 2024-05-02 MED ORDER — IOPAMIDOL 76 % IV SOLN
76 | Freq: Once | INTRAVENOUS | Status: AC | PRN
Start: 2024-05-02 — End: 2024-05-02
  Administered 2024-05-02: 20:00:00 100 mL via INTRAVENOUS

## 2024-05-02 MED ORDER — SERTRALINE HCL 50 MG PO TABS
50 | Freq: Every day | ORAL | Status: DC
Start: 2024-05-02 — End: 2024-05-09
  Administered 2024-05-03 – 2024-05-09 (×6): 100 mg via ORAL

## 2024-05-02 MED ORDER — BUDESONIDE 0.5 MG/2ML IN SUSP
0.5 | Freq: Two times a day (BID) | RESPIRATORY_TRACT | Status: DC
Start: 2024-05-02 — End: 2024-05-07
  Administered 2024-05-02 – 2024-05-07 (×7): 500 mg via RESPIRATORY_TRACT

## 2024-05-02 MED ORDER — NORMAL SALINE FLUSH 0.9 % IV SOLN
0.9 | Freq: Two times a day (BID) | INTRAVENOUS | Status: DC
Start: 2024-05-02 — End: 2024-05-09
  Administered 2024-05-03 – 2024-05-09 (×12): 10 mL via INTRAVENOUS

## 2024-05-02 MED ORDER — ALBUTEROL SULFATE 1.25 MG/3ML IN NEBU
1.25 | RESPIRATORY_TRACT | Status: DC | PRN
Start: 2024-05-02 — End: 2024-05-02

## 2024-05-02 MED ORDER — ACETAMINOPHEN 325 MG PO TABS
325 | Freq: Four times a day (QID) | ORAL | Status: DC | PRN
Start: 2024-05-02 — End: 2024-05-09
  Administered 2024-05-03 – 2024-05-06 (×2): 650 mg via ORAL

## 2024-05-02 MED ORDER — METHYLPREDNISOLONE SODIUM SUCC 125 MG IJ SOLR
125 | Freq: Three times a day (TID) | INTRAMUSCULAR | Status: DC
Start: 2024-05-02 — End: 2024-05-04
  Administered 2024-05-02 – 2024-05-04 (×5): 60 mg via INTRAVENOUS

## 2024-05-02 MED ORDER — ALBUTEROL SULFATE (2.5 MG/3ML) 0.083% IN NEBU
Freq: Once | RESPIRATORY_TRACT | Status: AC
Start: 2024-05-02 — End: 2024-05-02
  Administered 2024-05-02: 21:00:00 1 via RESPIRATORY_TRACT

## 2024-05-02 MED ORDER — FUROSEMIDE 10 MG/ML IJ SOLN
10 | Freq: Once | INTRAMUSCULAR | Status: AC
Start: 2024-05-02 — End: 2024-05-02
  Administered 2024-05-02: 22:00:00 40 mg via INTRAVENOUS

## 2024-05-02 MED ORDER — PANTOPRAZOLE SODIUM 40 MG PO TBEC
40 | Freq: Two times a day (BID) | ORAL | Status: DC
Start: 2024-05-02 — End: 2024-05-09
  Administered 2024-05-03 – 2024-05-09 (×13): 40 mg via ORAL

## 2024-05-02 MED ORDER — ONDANSETRON HCL 4 MG/2ML IJ SOLN
4 | Freq: Four times a day (QID) | INTRAMUSCULAR | Status: DC | PRN
Start: 2024-05-02 — End: 2024-05-09

## 2024-05-02 MED ORDER — IPRATROPIUM-ALBUTEROL 0.5-2.5 (3) MG/3ML IN SOLN
0.5-2.5 | RESPIRATORY_TRACT | Status: DC | PRN
Start: 2024-05-02 — End: 2024-05-09
  Administered 2024-05-02 – 2024-05-03 (×2): 1 via RESPIRATORY_TRACT

## 2024-05-02 MED ORDER — ARFORMOTEROL TARTRATE 15 MCG/2ML IN NEBU
15 | Freq: Two times a day (BID) | RESPIRATORY_TRACT | Status: DC
Start: 2024-05-02 — End: 2024-05-09
  Administered 2024-05-04 – 2024-05-09 (×9): 15 ug via RESPIRATORY_TRACT

## 2024-05-02 MED ORDER — ALBUTEROL SULFATE (2.5 MG/3ML) 0.083% IN NEBU
RESPIRATORY_TRACT | Status: DC | PRN
Start: 2024-05-02 — End: 2024-05-09

## 2024-05-02 MED ORDER — SODIUM CHLORIDE 0.9 % IV SOLN (ADDEASE)
0.9 | Freq: Two times a day (BID) | INTRAVENOUS | Status: DC
Start: 2024-05-02 — End: 2024-05-06
  Administered 2024-05-02 – 2024-05-06 (×8): 100 mg via INTRAVENOUS

## 2024-05-02 MED ORDER — FUROSEMIDE 10 MG/ML IJ SOLN
10 | Freq: Two times a day (BID) | INTRAMUSCULAR | Status: DC
Start: 2024-05-02 — End: 2024-05-05
  Administered 2024-05-03 – 2024-05-04 (×3): 40 mg via INTRAVENOUS

## 2024-05-02 MED ORDER — ENOXAPARIN SODIUM 30 MG/0.3ML IJ SOSY
30 | Freq: Two times a day (BID) | INTRAMUSCULAR | Status: DC
Start: 2024-05-02 — End: 2024-05-02

## 2024-05-02 MED ORDER — POLYETHYLENE GLYCOL 3350 17 G PO PACK
17 | Freq: Every day | ORAL | Status: DC | PRN
Start: 2024-05-02 — End: 2024-05-09
  Administered 2024-05-05: 19:00:00 17 g via ORAL

## 2024-05-02 MED ORDER — ESCITALOPRAM OXALATE 10 MG PO TABS
10 | Freq: Every day | ORAL | Status: DC
Start: 2024-05-02 — End: 2024-05-09
  Administered 2024-05-03 – 2024-05-09 (×7): 5 mg via ORAL

## 2024-05-02 MED ORDER — SODIUM CHLORIDE 0.9 % IV SOLN
0.9 | INTRAVENOUS | Status: DC | PRN
Start: 2024-05-02 — End: 2024-05-09

## 2024-05-02 MED ORDER — MAGNESIUM SULFATE 2000 MG/50 ML IVPB PREMIX
2 | INTRAVENOUS | Status: DC | PRN
Start: 2024-05-02 — End: 2024-05-04

## 2024-05-02 MED FILL — ALBUTEROL SULFATE 1.25 MG/3ML IN NEBU: 1.25 MG/3ML | RESPIRATORY_TRACT | Qty: 3 | Fill #0

## 2024-05-02 MED FILL — ARFORMOTEROL TARTRATE 15 MCG/2ML IN NEBU: 15 MCG/2ML | RESPIRATORY_TRACT | Qty: 2 | Fill #0

## 2024-05-02 MED FILL — IPRATROPIUM-ALBUTEROL 0.5-2.5 (3) MG/3ML IN SOLN: 0.5-2.5 (3) MG/3ML | RESPIRATORY_TRACT | Qty: 3 | Fill #0

## 2024-05-02 MED FILL — FUROSEMIDE 10 MG/ML IJ SOLN: 10 mg/mL | INTRAMUSCULAR | Qty: 4 | Fill #0

## 2024-05-02 MED FILL — METHYLPREDNISOLONE SODIUM SUCC 125 MG IJ SOLR: 125 mg | INTRAMUSCULAR | Qty: 125 | Fill #0

## 2024-05-02 MED FILL — DOXYCYCLINE HYCLATE 100 MG IV SOLR: 100 mg | INTRAVENOUS | Qty: 100 | Fill #0

## 2024-05-02 MED FILL — ALBUTEROL SULFATE (2.5 MG/3ML) 0.083% IN NEBU: RESPIRATORY_TRACT | Qty: 3 | Fill #0

## 2024-05-02 MED FILL — BUDESONIDE 0.5 MG/2ML IN SUSP: 0.5 MG/2ML | RESPIRATORY_TRACT | Qty: 2 | Fill #0

## 2024-05-02 MED FILL — ISOVUE-370 76 % IV SOLN: 76 % | INTRAVENOUS | Qty: 100 | Fill #0

## 2024-05-02 MED FILL — BD POSIFLUSH 0.9 % IV SOLN: 0.9 % | INTRAVENOUS | Qty: 40 | Fill #0

## 2024-05-02 NOTE — ED Provider Notes (Signed)
 [facility] EMERGENCY DEPARTMENT  EMERGENCY DEPARTMENT ENCOUNTER      Pt Name: Breanna Lester  MRN: [id number]  Birthdate 30-Nov-1960  Date of evaluation: 05/02/2024  Provider: Corean Sheffield, DO    CHIEF COMPLAINT       Chief Complaint   Patient presents with    Cough    Shortness of Breath         HISTORY OF PRESENT ILLNESS    HPI    MARRISA KIMBER is a 63 y.o. female with history of HTN, HLD, paroxysmal atrial fibrillation on Eliquis , diabetes, cirrhosis, COPD on 2 L nasal cannula oxygen at home who presents to the emergency department for evaluation of cough and shortness of breath.  Patient notes over the last month having ongoing shortness of breath, patient was recently hospitalized 8/19 to 8/22 for COPD exacerbation and pneumonia; notes since then she has been having ongoing shortness of breath which has worsened over the last several days.  Endorses associated cough which is mostly nonproductive, sometimes after using her inhaler she will have some productive phlegm.  No known fevers.    Nursing Notes were reviewed.    REVIEW OF SYSTEMS       Review of Systems   Constitutional:  Negative for fever.   Respiratory:  Positive for cough and shortness of breath.            PAST MEDICAL HISTORY     Past Medical History:   Diagnosis Date    Anxiety     Depression     Gastrointestinal disorder     Heart failure (HCC)     Hyperlipidemia     Hypertension     Liver disease 2009    liver failure    Pneumonia          SURGICAL HISTORY       Past Surgical History:   Procedure Laterality Date    ADENOIDECTOMY  1972    BACK SURGERY      cement discs following a fall    CATARACT REMOVAL      CESAREAN SECTION  1986 and 1993    DILATION AND CURETTAGE OF UTERUS  1999    EGD TRANSORAL BIOPSY SINGLE/MULTIPLE  05/17/2009         HYSTERECTOMY (CERVIX STATUS UNKNOWN)  2008    TONSILLECTOMY  1972    UPPER GASTROINTESTINAL ENDOSCOPY N/A 04/17/2024    ESOPHAGOGASTRODUODENOSCOPY performed by Madlyn Fendt, MD at [facility] ENDOSCOPY          CURRENT MEDICATIONS       Previous Medications    ACETAMINOPHEN  (TYLENOL ) 325 MG TABLET    Take 1 tablet by mouth every 6 hours as needed for Pain    ALBUTEROL  SULFATE HFA (VENTOLIN  HFA) 108 (90 BASE) MCG/ACT INHALER    Inhale 2 puffs into the lungs 4 times daily as needed for Wheezing    AMOXICILLIN  (AMOXIL ) 500 MG CAPSULE    Take 2 capsules by mouth 3 times daily For 4 days, pt hasn't finished yet    APIXABAN  (ELIQUIS ) 5 MG TABS TABLET    Take 1 tablet by mouth 2 times daily    BLOOD GLUCOSE MONITOR STRIPS    Test 1-2 times a day & as needed for symptoms of irregular blood glucose. Dispense sufficient amount for indicated testing frequency plus additional to accommodate PRN testing needs. Pharmacist to identify preferred brand.    BLOOD GLUCOSE MONITORING SUPPL (BLOOD GLUCOSE MONITOR SYSTEM) W/DEVICE KIT    Pharmacist  to identify preferred meter and strips.    LACTOBACILLUS (CULTURELLE) CAPSULE    Take 1 capsule by mouth daily (with breakfast)    LANCETS 30G MISC    Test 1-2 times a day & as needed for symptoms of irregular blood glucose. Dispense sufficient amount for indicated testing frequency plus additional to accommodate PRN testing needs. Pharmacist to identify preferred brand.    METOPROLOL  TARTRATE (LOPRESSOR ) 25 MG TABLET    Take 0.5 tablets by mouth 2 times daily    ONDANSETRON  (ZOFRAN -ODT) 4 MG DISINTEGRATING TABLET    Take 1 tablet by mouth every 8 hours as needed for Nausea or Vomiting    PANTOPRAZOLE  (PROTONIX ) 40 MG TABLET    Take 1 tablet by mouth every morning (before breakfast)    SERTRALINE  (ZOLOFT ) 100 MG TABLET    Take 1 tablet by mouth daily       ALLERGIES     Latex and Lactose intolerance (gi)    FAMILY HISTORY       Family History   Problem Relation Age of Onset    Cancer Mother     Heart Disease Father           SOCIAL HISTORY       Social History     Socioeconomic History    Marital status: Divorced     Spouse name: None    Number of children: None    Years of education: None     Highest education level: None   Tobacco Use    Smoking status: Former     Current packs/day: 0.00     Types: Cigarettes     Quit date: 02/28/2008     Years since quitting: 16.1    Smokeless tobacco: Never   Vaping Use    Vaping status: Never Used   Substance and Sexual Activity    Alcohol use: Never    Drug use: Never     Social Drivers of Health     Food Insecurity: Food Insecurity Present (04/24/2024)    Hunger Vital Sign     Worried About Running Out of Food in the Last Year: Often true     Ran Out of Food in the Last Year: Often true   Transportation Needs: Unmet Transportation Needs (04/24/2024)    PRAPARE - Therapist, art (Medical): Yes     Lack of Transportation (Non-Medical): Yes   Housing Stability: High Risk (04/24/2024)    Housing Stability Vital Sign     Unable to Pay for Housing in the Last Year: Yes     Number of Times Moved in the Last Year: 6     Homeless in the Last Year: No           PHYSICAL EXAM       ED Triage Vitals [05/02/24 1417]   BP Girls Systolic BP Percentile Girls Diastolic BP Percentile Boys Systolic BP Percentile Boys Diastolic BP Percentile Temp Temp Source Pulse   114/63 -- -- -- -- 98.5 F (36.9 C) Oral 61      Respirations SpO2 Height Weight - Scale       20 97 % 1.575 m (5' 2) 129.3 kg (285 lb)           Body mass index is 52.13 kg/m.    Physical Exam  Vitals and nursing note reviewed.   Constitutional:       General: She is in acute distress.  Appearance: Normal appearance. She is obese. She is not toxic-appearing.   HENT:      Head: Normocephalic and atraumatic.   Eyes:      General: No scleral icterus.        Right eye: No discharge.         Left eye: No discharge.      Conjunctiva/sclera: Conjunctivae normal.   Cardiovascular:      Rate and Rhythm: Normal rate.      Pulses: Normal pulses.   Pulmonary:      Effort: Pulmonary effort is normal. No respiratory distress.      Breath sounds: Decreased breath sounds and wheezing present.   Abdominal:       Tenderness: There is no abdominal tenderness. There is no guarding or rebound.   Musculoskeletal:         General: Normal range of motion.      Cervical back: Normal range of motion.   Skin:     General: Skin is warm and dry.      Capillary Refill: Capillary refill takes less than 2 seconds.   Neurological:      General: No focal deficit present.      Mental Status: She is alert.   Psychiatric:         Mood and Affect: Mood normal.         Behavior: Behavior normal.         DIAGNOSTIC RESULTS     EKG: All EKG's are interpreted by the Emergency Department Physician who either signs or Co-signs this chart in the absence of a cardiologist.    ED Course as of 05/03/24 0749   Fri May 02, 2024   1454 XR CHEST PORTABLE  ECG at 2:11 PM, interpreted by me: Normal sinus rhythm, rate 67 bpm.  Normal axis.  Narrow QRS.  No ST elevations. [SH]      ED Course User Index  [SH] Dwane Krabbe, DO       RADIOLOGY:   Non-plain film images such as CT, Ultrasound and MRI are read by the radiologist. Plain radiographic images are visualized and preliminarily interpreted by the emergency physician with the below findings:        Interpretation per the Radiologist below, if available at the time of this note:    XR CHEST PORTABLE    (Results Pending)        LABS:  Labs Reviewed   COVID-19 & INFLUENZA COMBO   EXTRA TUBES HOLD   TROPONIN   CBC WITH AUTO DIFFERENTIAL   COMPREHENSIVE METABOLIC PANEL   BRAIN NATRIURETIC PEPTIDE       All other labs were within normal range or not returned as of this dictation.    EMERGENCY DEPARTMENT COURSE and DIFFERENTIAL DIAGNOSIS/MDM:   Vitals:    Vitals:    05/02/24 1417   BP: 114/63   Pulse: 61   Resp: 20   Temp: 98.5 F (36.9 C)   TempSrc: Oral   SpO2: 97%   Weight: 129.3 kg (285 lb)   Height: 1.575 m (5' 2)           Medical Decision Making      DECISION MAKING:  CATHE BILGER is a 63 y.o. female who comes in as above.  Vital signs reviewed, patient is afebrile, vital signs stable.  Oxygen  saturation 97% on 4 L nasal cannula.  On my examination, chronically ill-appearing, in mild distress.  On lung auscultation, poor air movement  with scattered wheezing.  Differential diagnosis includes, not limited to, COPD exacerbation, pneumonia, CHF, ACS, PE.      Amount and/or Complexity of Data Reviewed  Labs: ordered. Decision-making details documented in ED Course.  Radiology: ordered. Decision-making details documented in ED Course.  ECG/medicine tests: ordered.    Risk  Prescription drug management.  Decision regarding hospitalization.        CONSULTS:  None    PROCEDURES:  Unless otherwise noted below, none     Procedures        FINAL IMPRESSION      1. Swelling of lower leg    2. New onset of congestive heart failure (HCC)    3. COPD exacerbation (HCC)          3:00 PM  Change of shift.  Care of patient signed over to Dr. Phylis pending completion of labs and CTA chest.     (Please note that portions of this note were completed with a voice recognition program.  Efforts were made to edit the dictations but occasionally words are mis-transcribed.)    Corean Sheffield, DO (electronically signed)  Emergency Medicine Attending Physician          Sheffield Corean, DO  05/03/24 [phone]

## 2024-05-02 NOTE — ED Notes (Signed)
 Patient recovered from dyspnea as soon as reseated in bed and nebulizer complete. Patient back down to 4L which patient has been on entire ED visit except for period of increased physical activity up to [facility]

## 2024-05-02 NOTE — Plan of Care (Signed)
 Problem: Respiratory - Adult  Goal: Achieves optimal ventilation and oxygenation  Outcome: Progressing

## 2024-05-02 NOTE — ED Notes (Signed)
 Patient had soaking void. Linen and chucks change. Patient repositioned and Pure Wick repositioned.

## 2024-05-02 NOTE — ED Notes (Signed)
 ED Course as of 05/02/24 1655   Fri May 02, 2024   1454 XR CHEST PORTABLE  ECG at 2:11 PM, interpreted by me: Normal sinus rhythm, rate 67 bpm.  Normal axis.  Narrow QRS.  No ST elevations. [SH]   1501 Signout  HX COPD on 2LNC. Wheezing. On 4LNC now. Pending cxr, labs, reassessment. Already on eliquis . [AS]   1542 NT Pro-BNP(!): 3,447 [AS]      ED Course User Index  [AS] Phylis Cornet, MD  [SH] Dwane Krabbe, DO     Reassessed patient, feels marginally better after DuoNeb  treatment requesting another 1.  Her workup is significant for moderate pleural effusions and elevated BNP which is a new problem.  Previous echo showed normal EF.  Concern for new onset congestive heart failure.  Given increased oxygen requirement, will admit for diuresis and further care.    Perfect Serve Consult for Admission  4:56 PM    ED Room Number: C10/C10  Patient Name and age:  Breanna Lester 63 y.o.  female  Working Diagnosis:   1. New onset of congestive heart failure (HCC)    2. COPD exacerbation (HCC)        COVID-19 Suspicion: No  Sepsis present:  No  Reassessment needed: No  Code Status:  Full Code  Readmission: No  Isolation Requirements: no  Recommended Level of Care: telemetry  Department: [facility] ED - [phone]  Consulting Provider: n/a    Other:  19F hx Afib, COPD on [facility] here for SOB and cough. Recent admission for COPD/pneumonia. Requiring increase in oxygen requirement to [facility]. CT shows moderate pulmonary effusions and BNP newly elevated to 4000. Giving diuresis and nebs.      Total critical care time spent exclusive of procedures:  35 minutes.        Phylis Cornet, MD  05/02/24 [phone]

## 2024-05-02 NOTE — ED Triage Notes (Addendum)
 Pt arrived by EMS complaining of SOB, presenting with unlabored breathing, cough, and expiratory wheezing, on 6L NC. Pt reports pain in L side that started 2 days ago when pt was laying in bed. Been seen in the ER last on the 5th, in and out of hospital several times this past month.    PMH afib, cirrhosis, CHF. Denies smoking.   Pt. Uses rescue inhaler at home.

## 2024-05-02 NOTE — ED Notes (Signed)
 Patient out the door at this time with [facility] crew. Crew to give neb in route.

## 2024-05-03 LAB — EKG 12-LEAD
Atrial Rate: 67 {beats}/min
Diagnosis: NORMAL
P Axis: 66 degrees
P-R Interval: 156 ms
Q-T Interval: 470 ms
QRS Duration: 86 ms
QTc Calculation (Bazett): 496 ms
R Axis: 78 degrees
T Axis: 227 degrees
Ventricular Rate: 67 {beats}/min

## 2024-05-03 LAB — POCT GLUCOSE
POC Glucose: 188 mg/dL — ABNORMAL HIGH (ref 65–117)
POC Glucose: 198 mg/dL — ABNORMAL HIGH (ref 65–117)
POC Glucose: 246 mg/dL — ABNORMAL HIGH (ref 65–117)
POC Glucose: 266 mg/dL — ABNORMAL HIGH (ref 65–117)
POC Glucose: 264 mg/dL — ABNORMAL HIGH (ref 65–117)

## 2024-05-03 LAB — CBC WITH AUTO DIFFERENTIAL
Basophils %: 0 % (ref 0.0–1.0)
Basophils Absolute: 0 K/UL (ref 0.00–0.10)
Eosinophils %: 0 % (ref 0.0–7.0)
Eosinophils Absolute: 0 K/UL (ref 0.00–0.40)
Hematocrit: 35.2 % (ref 35.0–47.0)
Hemoglobin: 10.6 g/dL — ABNORMAL LOW (ref 11.5–16.0)
Immature Granulocytes %: 0.3 % (ref 0.0–0.5)
Immature Granulocytes Absolute: 0.01 K/UL (ref 0.00–0.04)
Lymphocytes %: 15.8 % (ref 12.0–49.0)
Lymphocytes Absolute: 0.47 K/UL — ABNORMAL LOW (ref 0.80–3.50)
MCH: 26.2 pg (ref 26.0–34.0)
MCHC: 30.1 g/dL (ref 30.0–36.5)
MCV: 87.1 FL (ref 80.0–99.0)
MPV: 11.1 FL (ref 8.9–12.9)
Monocytes %: 2 % — ABNORMAL LOW (ref 5.0–13.0)
Monocytes Absolute: 0.06 K/UL (ref 0.00–1.00)
Neutrophils %: 81.9 % — ABNORMAL HIGH (ref 32.0–75.0)
Neutrophils Absolute: 2.44 K/UL (ref 1.80–8.00)
Nucleated RBCs: 0 /100{WBCs}
Platelets: 104 K/uL — ABNORMAL LOW (ref 150–400)
RBC: 4.04 M/uL (ref 3.80–5.20)
RDW: 15.2 % — ABNORMAL HIGH (ref 11.5–14.5)
WBC: 3 K/uL — ABNORMAL LOW (ref 3.6–11.0)
nRBC: 0 K/uL (ref 0.00–0.01)

## 2024-05-03 LAB — PROTIME-INR
INR: 1.3 — ABNORMAL HIGH (ref 0.9–1.1)
Protime: 13.1 s — ABNORMAL HIGH (ref 9.2–11.2)

## 2024-05-03 LAB — BASIC METABOLIC PANEL
Anion Gap: 9 mmol/L (ref 2–14)
BUN/Creatinine Ratio: 22 — ABNORMAL HIGH (ref 12–20)
BUN: 18 mg/dL (ref 8–23)
CO2: 31 mmol/L — ABNORMAL HIGH (ref 20–29)
Calcium: 8.6 mg/dL — ABNORMAL LOW (ref 8.8–10.2)
Chloride: 103 mmol/L (ref 98–107)
Creatinine: 0.83 mg/dL (ref 0.60–1.00)
Est, Glom Filt Rate: 80 ml/min/1.73m2 (ref >59)
Glucose: 198 mg/dL — ABNORMAL HIGH (ref 65–100)
Potassium: 4.4 mmol/L (ref 3.5–5.1)
Sodium: 143 mmol/L (ref 136–145)

## 2024-05-03 LAB — MAGNESIUM: Magnesium: 2 mg/dL (ref 1.6–2.4)

## 2024-05-03 MED ORDER — IPRATROPIUM-ALBUTEROL 0.5-2.5 (3) MG/3ML IN SOLN
0.5-2.5 | RESPIRATORY_TRACT | Status: DC
Start: 2024-05-03 — End: 2024-05-05
  Administered 2024-05-03 – 2024-05-05 (×4): 1 via RESPIRATORY_TRACT

## 2024-05-03 MED ORDER — DEXTROSE 10 % IV BOLUS
INTRAVENOUS | Status: DC | PRN
Start: 2024-05-03 — End: 2024-05-09

## 2024-05-03 MED ORDER — GLUCOSE 4 G PO CHEW
4 | ORAL | Status: DC | PRN
Start: 2024-05-03 — End: 2024-05-09

## 2024-05-03 MED ORDER — APIXABAN 5 MG PO TABS
5 | Freq: Two times a day (BID) | ORAL | Status: DC
Start: 2024-05-03 — End: 2024-05-09
  Administered 2024-05-03 – 2024-05-09 (×14): 5 mg via ORAL

## 2024-05-03 MED ORDER — GLUCAGON (RDNA) 1 MG IJ KIT
1 | INTRAMUSCULAR | Status: DC | PRN
Start: 2024-05-03 — End: 2024-05-09

## 2024-05-03 MED ORDER — CYCLOBENZAPRINE HCL 10 MG PO TABS
10 | Freq: Two times a day (BID) | ORAL | Status: AC
Start: 2024-05-03 — End: 2024-05-04
  Administered 2024-05-04 (×2): 10 mg via ORAL

## 2024-05-03 MED ORDER — CULTURELLE PO CAPS
Freq: Every day | ORAL | Status: DC
Start: 2024-05-03 — End: 2024-05-09
  Administered 2024-05-03 – 2024-05-09 (×6): 1 via ORAL

## 2024-05-03 MED ORDER — HYDROMORPHONE HCL 2 MG PO TABS
2 | ORAL | Status: DC | PRN
Start: 2024-05-03 — End: 2024-05-09
  Administered 2024-05-04 – 2024-05-07 (×3): 2 mg via ORAL

## 2024-05-03 MED ORDER — DEXTROSE 10 % IV SOLN
10 | INTRAVENOUS | Status: DC | PRN
Start: 2024-05-03 — End: 2024-05-09

## 2024-05-03 MED ORDER — INSULIN LISPRO 100 UNIT/ML IJ SOLN
100 | Freq: Four times a day (QID) | INTRAMUSCULAR | Status: DC
Start: 2024-05-03 — End: 2024-05-09
  Administered 2024-05-03: 16:00:00 2 [IU] via SUBCUTANEOUS
  Administered 2024-05-03: 23:00:00 4 [IU] via SUBCUTANEOUS
  Administered 2024-05-03 – 2024-05-04 (×3): 2 [IU] via SUBCUTANEOUS
  Administered 2024-05-04 – 2024-05-05 (×2): 4 [IU] via SUBCUTANEOUS
  Administered 2024-05-05 (×3): 2 [IU] via SUBCUTANEOUS
  Administered 2024-05-06: 02:00:00 4 [IU] via SUBCUTANEOUS
  Administered 2024-05-06 – 2024-05-07 (×3): 2 [IU] via SUBCUTANEOUS
  Administered 2024-05-07: 01:00:00 4 [IU] via SUBCUTANEOUS
  Administered 2024-05-07 – 2024-05-08 (×2): 2 [IU] via SUBCUTANEOUS
  Administered 2024-05-08: 01:00:00 8 [IU] via SUBCUTANEOUS
  Administered 2024-05-08 – 2024-05-09 (×2): 2 [IU] via SUBCUTANEOUS

## 2024-05-03 MED ORDER — LIDOCAINE 4 % EX PTCH
4 | Freq: Every day | CUTANEOUS | Status: DC | PRN
Start: 2024-05-03 — End: 2024-05-09
  Administered 2024-05-03 – 2024-05-06 (×2): 1 via TRANSDERMAL

## 2024-05-03 MED ORDER — METOPROLOL TARTRATE 25 MG PO TABS
25 | Freq: Two times a day (BID) | ORAL | Status: DC
Start: 2024-05-03 — End: 2024-05-09
  Administered 2024-05-03 – 2024-05-06 (×5): 25 mg via ORAL

## 2024-05-03 MED ORDER — HYDROMORPHONE HCL 2 MG PO TABS
2 | ORAL | Status: DC | PRN
Start: 2024-05-03 — End: 2024-05-09
  Administered 2024-05-03 – 2024-05-05 (×5): 1 mg via ORAL

## 2024-05-03 MED FILL — ELIQUIS 5 MG PO TABS: 5 mg | ORAL | Qty: 1 | Fill #0

## 2024-05-03 MED FILL — METHYLPREDNISOLONE SODIUM SUCC 125 MG IJ SOLR: 125 mg | INTRAMUSCULAR | Qty: 125 | Fill #0

## 2024-05-03 MED FILL — INSULIN LISPRO 100 UNIT/ML IJ SOLN: 100 [IU]/mL | INTRAMUSCULAR | Qty: 2 | Fill #0

## 2024-05-03 MED FILL — DOXYCYCLINE HYCLATE 100 MG IV SOLR: 100 mg | INTRAVENOUS | Qty: 100 | Fill #0

## 2024-05-03 MED FILL — HYDROMORPHONE HCL 2 MG PO TABS: 2 mg | ORAL | Qty: 1 | Fill #0

## 2024-05-03 MED FILL — ESCITALOPRAM OXALATE 10 MG PO TABS: 10 mg | ORAL | Qty: 1 | Fill #0

## 2024-05-03 MED FILL — PANTOPRAZOLE SODIUM 40 MG PO TBEC: 40 mg | ORAL | Qty: 1 | Fill #0

## 2024-05-03 MED FILL — ACETAMINOPHEN 325 MG PO TABS: 325 mg | ORAL | Qty: 2 | Fill #0

## 2024-05-03 MED FILL — FUROSEMIDE 10 MG/ML IJ SOLN: 10 mg/mL | INTRAMUSCULAR | Qty: 4 | Fill #0

## 2024-05-03 MED FILL — METOPROLOL TARTRATE 25 MG PO TABS: 25 mg | ORAL | Qty: 1 | Fill #0

## 2024-05-03 MED FILL — DEXTROSE 10 % IV SOLN: 10 % | INTRAVENOUS | Qty: 500 | Fill #0

## 2024-05-03 MED FILL — LIDOCAINE PAIN RELIEF 4 % EX PTCH: 4 % | CUTANEOUS | Qty: 1 | Fill #0

## 2024-05-03 MED FILL — CULTURELLE PO CAPS: ORAL | Qty: 1 | Fill #0

## 2024-05-03 MED FILL — INSULIN LISPRO 100 UNIT/ML IJ SOLN: 100 [IU]/mL | INTRAMUSCULAR | Qty: 4 | Fill #0

## 2024-05-03 MED FILL — SERTRALINE HCL 50 MG PO TABS: 50 mg | ORAL | Qty: 2 | Fill #0

## 2024-05-03 NOTE — Plan of Care (Signed)
 Problem: Physical Therapy - Adult  Goal: By Discharge: Performs mobility at highest level of function for planned discharge setting.  See evaluation for individualized goals.  Description: FUNCTIONAL STATUS PRIOR TO ADMISSION: Patient was independent and active without use of DME.    HOME SUPPORT PRIOR TO ADMISSION: The patient lived with daughter but did not require assistance. Lives with daughter that is high level autistic that works part time. Both live together in Extended stay motel    Physical Therapy Goals  Initiated 05/03/2024  1.  Patient will move from supine to sit and sit to supine in bed with modified independence within 7 day(s).    2.  Patient will perform sit to stand with modified independence within 7 day(s).  3.  Patient will transfer from bed to chair and chair to bed with modified independence using the least restrictive device within 7 day(s).  4.  Patient will ambulate with modified independence for 150 feet with the least restrictive device within 7 day(s).       Outcome: Progressing   PHYSICAL THERAPY EVALUATION    Patient: Breanna Lester (63 y.o. female)  Date: 05/03/2024  Primary Diagnosis: COPD exacerbation (HCC) [J44.1]  New onset of congestive heart failure (HCC) [I50.9]  Acute hypoxic respiratory failure (HCC) [J96.01]       Precautions:              ASSESSMENT :  63 y.o. female with PMH of anxiety/depression, HTN, XOL, COPD, a-fib, DM and recent hospitalizations 8/19 to 04/11/24 for CAP and GI bleed. ED due to SOB and new onset Dx CHF.    DEFICITS/IMPAIRMENTS:   The patient is limited by decreased functional mobility, independence in ADLs, high-level IADLs, strength, body mechanics, activity tolerance, endurance, safety awareness, coordination, balance, proprioception, vision/visual deficit, posture, increased pain levels     Based on the impairments listed above the patient is below her PLOF and needing an assistive device for safety during ambulation due to poor balance and need to  grab objects along the way and poor endurance due to CHF/COPD and need for supplemental 02 to keep oxygen saturation >90%.  Patient demonstrated limited distance ambulation without device and min A for balance with dyspnea noted while on 4L NC.  PLOF she utilized 02 at baseline.  Patient is below her PLOF and would benefit from a trial with Rolator.  She would also benefit from HHPT to improve her endurance and improve her balance and reduce risk of falls.      Patient will benefit from skilled intervention to address the above impairments.    Functional Outcome Measure:  The patient scored 17/24 on the [facility] outcome measure.       PLAN :  Recommendations and Planned Interventions:   bed mobility training, transfer training, gait training, therapeutic exercises, neuromuscular re-education, edema management/control, patient and family training/education, and therapeutic activities    Frequency/Duration: Patient will be followed by physical therapy to address goals, PT Plan of Care: 5 times/week to address goals.      Recommendations for mobility with staff: Recommend that staff completes patient mobility with assist x1 using rolling walker.    Recommendations for toileting with staff:  recommended toilet device: the bathroom and a bedside commode.       Recommendation for discharge: (in order for the patient to meet his/her long term goals):   Intermittent physical therapy up to 2-3x/week in previous living setting    Other factors to consider for discharge: available support  system works or is unable to provide adequate supervision and the patient would be alone and high risk for falls    IF patient discharges home will need the following DME: rollator                SUBJECTIVE:   Patient stated "I am doing ok."    OBJECTIVE DATA SUMMARY:       Past Medical History:   Diagnosis Date    Anxiety     Depression     Gastrointestinal disorder     Heart failure (HCC)     Hyperlipidemia     Hypertension     Liver disease  2009    liver failure    Pneumonia      Past Surgical History:   Procedure Laterality Date    ADENOIDECTOMY  1972    BACK SURGERY      cement discs following a fall    CATARACT REMOVAL      CESAREAN SECTION  1986 and 1993    DILATION AND CURETTAGE OF UTERUS  1999    EGD TRANSORAL BIOPSY SINGLE/MULTIPLE  05/17/2009         HYSTERECTOMY (CERVIX STATUS UNKNOWN)  2008    TONSILLECTOMY  1972    UPPER GASTROINTESTINAL ENDOSCOPY N/A 04/17/2024    ESOPHAGOGASTRODUODENOSCOPY performed by Madlyn Fendt, MD at [facility] ENDOSCOPY       Home Situation:  Social/Functional History  Lives With: Daughter  Type of Home:  (hotel (extended stay))  Home Layout: One level  Home Access: Level entry  Bathroom Shower/Tub: Electrical engineer: None  Bathroom Accessibility: Not accessible  Home Equipment: Oxygen, Walker - Rolling (Glucose monitor; BP cuff;)  Has the patient had two or more falls in the past year or any fall with injury in the past year?: Yes (1 fall: 01/2024.  getting out of uber tripped over curb skinned knees/bruising/swelling. ED xray negative)  Prior Level of Assist for ADLs: Needs assistance  Bath: Independent  Dressing: Modified independent  Grooming: Independent  Feeding: Independent  Toileting: Independent  Homemaking Responsibilities: No  Prior Level of Assist for Ambulation: Independent household ambulator, with or without device  Prior Level of Assist for Transfers: Independent  Active Driver: No  Mode of Transportation: Bus, Other Corporate investment banker)  Occupation: On disability  Leisure & Hobbies: reading; take care of dog; cooking    Cognitive/Behavioral Status:     Cognition  Overall Cognitive Status: WNL    Skin: intact    Edema: nT    Hearing:   Hearing  Hearing: Within Functional Limits    Vision/Perceptual:          Vision  Vision: Impaired           Strength:    Strength: Generally decreased, functional    Tone & Sensation:   Tone: Normal  Sensation:  Intact    Coordination:  Coordination: Generally decreased, functional    Range Of Motion:  AROM: Generally decreased, functional       Functional Mobility:  Bed Mobility:     Bed Mobility Training  Bed Mobility Training: Yes  Overall Level of Assistance: Supervision  Interventions: Verbal cues  Rolling: Supervision  Supine to Sit: Supervision  Sit to Supine: Supervision  Scooting: Supervision  Transfers:     Art therapist: Yes  Overall Level of Assistance: Contact guard assistance  Stand to Sit: Stand by assistance  Toilet Transfer: Stand by assistance  Balance:               Balance  Sitting: Intact  Standing: Intact;With support  Ambulation/Gait Training:                       Gait  Gait Training: Yes  Overall Level of Assistance: Minimal assistance  Distance (ft): 12 Feet  Assistive Device: Gait belt  Base of Support: Widened  Speed/Cadence: Pace decreased (< 100 feet/min)  Gait Abnormalities: Altered arm swing;Path deviations;Trunk sway increased                                                                                                                                                                                                                                                          [employer] AM-PAC      Basic Mobility Inpatient Short Form (6-Clicks) Version 2  How much HELP from another person do you currently need... (If the patient hasn't done an activity recently, how much help from another person do you think they would need if they tried?) Total A Lot A Little None   1.  Turning from your back to your side while in a flat bed without using bedrails? []   1 []   2 [x]   3  []   4   2.  Moving from lying on your back to sitting on the side of a flat bed without using bedrails? []   1 []   2 [x]   3  []   4   3.  Moving to and from a bed to a chair (including a wheelchair)? []   1 []   2 [x]   3  []   4   4. Standing up from a chair using your arms (e.g. wheelchair or bedside  chair)? []   1 []   2 [x]   3  []   4   5.  Walking in hospital room? []   1 []   2 [x]   3  []   4   6.  Climbing 3-5 steps with a railing? []   1 [x]   2 []   3  []   4     Raw Score: 17/24  Cutoff score <=171,2,3 had higher odds of discharging home with home health or need of SNF/IPR.    1. Diane U. Jette, Ronal Broody, Vinoth K. Ranganathan, Sandra D. Passek, Gilmore RAMAN. Waldemar Dale EMERSON Aneta.  Validity of the AM-PAC "6-Clicks" Inpatient Daily Activity and Basic Mobility Short Forms. Physical Therapy Mar 2014, [phone]) 379-391; DOI: 10.2522/ptj.20130199  2. Warren M, Knecht J, Verheijde J, Tompkins J. Association of AM-PAC 6-Clicks Basic Mobility and Daily Activity Scores With Discharge Destination. Phys Ther. 2021 Apr 4;101(4):pzab043. doi: 10.1093/ptj/pzab043. PMID: 66482536.  3. Herbold J, Rajaraman D, Snigdha Howser, Agayby K, Shindler S. Activity Measure for Post-Acute Care 6-Clicks Basic Mobility Scores Predict Discharge Destination After Acute Care Hospitalization in Select Patient Groups: A Retrospective, Observational Study. Arch Rehabil Res Clin Transl. 2022 Jul 16;4(3):100204. doi: 10.1016/j.arrct.7977.899795. PMID: 63876017; PMCID: EFR0517973.  4. Aneta DELENA Darryle RAMAN, Coster W, Ni P. AM-PAC Short Forms Manual 4.0. Revised 09/2018.                                                                                                                                                                                                                              Treatment Rendered:  Yes assisted patient to bathroom with min A due to furniture and object grabbing along the way. Educated patient on the need for assistive device and safety benefits of a Rolator to allow patient to sit and catch breath and hold 02 tank in basket.  Did not have device and was unable to practice at this time. Patient with SOB during gait to and from bathroom with limited activity. Patient's Sats 93 on 4L.  Patient remained on  supplemental 02 during entire session HR 77bpm.           Pain Rating:  Reports LBP, unrated chronic in nature  Pain Intervention(s):   repositioning    Activity Tolerance:   Fair     After treatment:   Patient left in no apparent distress sitting up in chair and Call bell within reach    COMMUNICATION/EDUCATION:   The patient's plan of care was discussed with: registered nurse    Patient Education  Education Given To: Patient  Education Provided: Role of Therapy;Plan of Care;Equipment  Education Provided Comments: need for wide Rollator, reach out to hope community  Education Method: Verbal  Education Outcome: Verbalized understanding    Thank you for this referral.  Greig VEAR Jointer, PT  Minutes: 30

## 2024-05-03 NOTE — Plan of Care (Signed)
 Problem: Respiratory - Adult  Goal: Achieves optimal ventilation and oxygenation  05/03/2024 1959 by Chad Arabia, RCP  Outcome: Progressing  05/03/2024 1248 by Judyth Pfeiffer, RN  Outcome: Progressing  05/03/2024 1137 by Morton Almarie MATSU, RT  Outcome: Progressing

## 2024-05-03 NOTE — Plan of Care (Signed)
 Problem: Chronic Conditions and Co-morbidities  Goal: Patient's chronic conditions and co-morbidity symptoms are monitored and maintained or improved  Outcome: Progressing     Problem: Safety - Adult  Goal: Free from fall injury  Outcome: Progressing     Problem: Pain  Goal: Verbalizes/displays adequate comfort level or baseline comfort level  Outcome: Progressing     Problem: Respiratory - Adult  Goal: Achieves optimal ventilation and oxygenation  05/03/2024 0322 by Wynn Rend, RN  Outcome: Progressing  05/02/2024 2146 by Chad Arabia, RCP  Outcome: Progressing     Problem: Skin/Tissue Integrity  Goal: Skin integrity remains intact  Description: 1.  Monitor for areas of redness and/or skin breakdown  2.  Assess vascular access sites hourly  3.  Every 4-6 hours minimum:  Change oxygen saturation probe site  4.  Every 4-6 hours:  If on nasal continuous positive airway pressure, respiratory therapy assess nares and determine need for appliance change or resting period  Outcome: Progressing     Problem: Discharge Planning  Goal: Discharge to home or other facility with appropriate resources  Outcome: Progressing     Problem: Cardiovascular - Adult  Goal: Maintains optimal cardiac output and hemodynamic stability  Outcome: Progressing     Problem: Skin/Tissue Integrity - Adult  Goal: Skin integrity remains intact  Description: 1.  Monitor for areas of redness and/or skin breakdown  2.  Assess vascular access sites hourly  3.  Every 4-6 hours minimum:  Change oxygen saturation probe site  4.  Every 4-6 hours:  If on nasal continuous positive airway pressure, respiratory therapy assess nares and determine need for appliance change or resting period  Outcome: Progressing     Problem: Musculoskeletal - Adult  Goal: Return mobility to safest level of function  Outcome: Progressing  Goal: Return ADL status to a safe level of function  Outcome: Progressing     Problem: Infection - Adult  Goal: Absence of infection  during hospitalization  Outcome: Progressing     Problem: Metabolic/Fluid and Electrolytes - Adult  Goal: Electrolytes maintained within normal limits  Outcome: Progressing  Goal: Glucose maintained within prescribed range  Outcome: Progressing

## 2024-05-03 NOTE — Progress Notes (Signed)
 Spiritual Health Progress Note  [facility]      Room # 511/01    Name: Breanna Lester           Age: 63 y.o.    Gender: female          MRN: [id number]  Religion: Baptist       Preferred Language: English      Date: 05/03/24  Visit Time: Begin Time: 1415 End Time : 1440  Complexity of Encounter: Moderate      Visit Summary: Chaplain visit for the patient who self identified as lonely. Introduced Location manager. Pt shared she is short of breath and needs her nurse. Alerted pt's nurse. Shared chaplain availability.    Referral/Consult From: Other (comment) (ADMISSION SCREENING)  Encounter Overview/Reason: Loneliness/Social Isolation  Encounter Code:     Crisis (if applicable):    Service Provided For: Patient     Patient was available.    Faith, Belief, Meaning:   Patient has beliefs or practices that help with coping during difficult times  Family/Friends No family/friends present  Rituals (if applicable)      Importance and Influence:  Patient unable to assess at this time  Family/Friends No family/friends present    Community:  Patient   indicated that they do not feel well-supported  Family/Friends   No family/ friends present.    Assessment and Plan of Care:   Emotions Expressed by Patient:   Assessment: Compromised coping, Impaired resilience, Stress overload    Interventions by Chaplain:   Intervention: Discussed illness injury and it's impact, Explored/Affirmed feelings, thoughts, concerns     Result/ Response by Patient:   Outcome: Comfort, Expressed Gratitude    Patient Plan of Care:   Plan and Referrals  Plan/Referrals: Other (Comment) (Please contact Spiritual Health for further consults)     Emotions Expressed by Spouse/Family/Friends:   No family/ friends present when chaplain visited.    Chaplain Interventions with Spouse/ Family/Friends include:   active listening    Spouse/Family/Friends Plan of Care:   Spiritual care available upon referral.      Electronically signed by    Pola Furno Harper Salome Cozby,  Chaplain, MS, MDiv, BCC-ACP  on 05/03/2024 at 2:40 PM  Spiritual Health Services  [facility]/[place]  Paging service: [phone] (PRAY)

## 2024-05-03 NOTE — Progress Notes (Signed)
 [facility]  [address] [place], [place] [address]  [phone]    [facility]                                                                                          Hospitalist Progress Note  Cozette GORMAN Fear, MD        Date of Service:  05/03/2024  NAME:  Breanna Lester  DOB:  Nov 27, 1960  MRN:  [id number]      Interval history / Subjective:   Patient reports feeling short of breath.  She tried to get into bed by herself from the chair and thinks she pulled her back and reports severe pain     Assessment & Plan:     Acute hypoxic respiratory failure due to COPD exacerbation  - Only on 4 L nasal cannula  - Wean as tolerated to maintain sats above 90%  -Treat acute issues below    Acute COPD exacerbation  - Rapid flu and COVID-negative  -Continue Solu-Medrol , Pulmicort /Brovana , scheduled nebs  - Continue doxycycline     Bilateral pleural effusions  -Right greater than left  -Continue Lasix   - Recent echo less than a month ago with preserved EF, normal wall motion    Diarrhea  -Continue probiotic  - Recent enteric panel was negative    Left lower extremity edema  -Lower extremity Dopplers pending    Alcoholic cirrhosis of liver  -INR slightly elevated  - Appears compensated for now    Atrial fibrillation  -On metoprolol  and Eliquis     Type 2 diabetes hyperglycemia  -Recent A1c 8.2  - Continue SSI per protocol    CAD  -Troponins flat  - Continue Eliquis , beta-blocker    Hypertension  -Continue metoprolol         Outisde Records, prior notes, labs, radiology, and medications reviewed     Code status: Full code  DVT prophylaxis: Eliquis        Hospital Problems           Last Modified POA    * (Principal) Acute hypoxic respiratory failure (HCC) 05/02/2024 Yes    Alcoholic cirrhosis of liver (HCC) 05/02/2024 Yes    PAF (paroxysmal atrial fibrillation) (HCC) 05/02/2024 Yes    COPD (chronic obstructive pulmonary disease) (HCC) 05/02/2024 Yes    Type 2 diabetes  mellitus with hyperglycemia, without long-term current use of insulin  (HCC) 05/02/2024 Yes    CAD (coronary artery disease) 05/02/2024 Yes    HTN (hypertension) 05/02/2024 Yes          Review of Systems:   Pertinent items are noted in HPI.       Vital Signs:    Last 24hrs VS reviewed since prior progress note. Most recent are:  Vitals:    05/03/24 0917   BP:    Pulse: 76   Resp:    Temp:    SpO2:        No intake or output data in the 24 hours ending 05/03/24 1009     Physical Examination:  Constitutional:  No acute distress, cooperative, pleasant    ENT:  Oral mucosa moist, oropharynx benign.    Resp:  CTA bilaterally. No wheezing/rhonchi/rales. No accessory muscle use   CV:  Regular rhythm, normal rate, no murmurs, gallops, rubs    GI:  Soft, non distended, non tender. normoactive bowel sounds, no hepatosplenomegaly     Musculoskeletal:  No edema, warm, 2+ pulses throughout    Neurologic:  Moves all extremities.  AAOx3, CN II-XII reviewed     Psych:  Good insight, Not anxious nor agitated.       Data Review:    Review and/or order of clinical lab test      Labs:     Recent Labs     05/02/24  1444 05/03/24  0557   WBC 5.0 3.0*   HGB 10.1* 10.6*   HCT 33.9* 35.2   PLT 109* 104*     Recent Labs     05/02/24  1444 05/03/24  0557   NA 144 143   K 3.6 4.4   CL 105 103   CO2 26 31*   BUN 21 18   MG  --  2.0     Lab Results   Component Value Date/Time    ALT 12 05/02/2024 02:44 PM    ALT 20 04/24/2024 04:59 AM    ALT 34 04/15/2024 04:25 AM    GLOB 3.0 05/02/2024 02:44 PM    GLOB 3.5 04/24/2024 04:59 AM    GLOB 3.4 04/15/2024 04:25 AM     Lab Results   Component Value Date/Time    INR 1.3 04/23/2024 07:01 PM      Lab Results   Component Value Date/Time    IRON 40 04/24/2024 04:59 AM    TIBC 292 04/24/2024 04:59 AM      No results found for: RBCF   No results for input(s): PH, PCO2, PO2 in the last 72 hours.  No results found for: CPK  No results found for: CHOL, CHLST, CHOLV, HDL, HDLC, LDL,  LDLC  No results found for: GLUCPOC  No results found for: UA      Medications Reviewed:     Current Facility-Administered Medications   Medication Dose Route Frequency    ipratropium 0.5 mg-albuterol  2.5 mg (DUONEB ) nebulizer solution 1 Dose  1 Dose Inhalation Q20 Min PRN    methylPREDNISolone  sodium succ (SOLU-MEDROL ) 60 mg in sterile water  0.96 mL injection  60 mg IntraVENous Q8H    budesonide  (PULMICORT ) nebulizer suspension 500 mcg  0.5 mg Nebulization BID RT    arformoterol  tartrate (BROVANA ) nebulizer solution 15 mcg  15 mcg Nebulization BID RT    furosemide  (LASIX ) injection 40 mg  40 mg IntraVENous BID    doxycycline  (VIBRAMYCIN ) 100 mg in sodium chloride  0.9 % 100 mL IVPB (addEASE)  100 mg IntraVENous Q12H    benzonatate  (TESSALON ) capsule 100 mg  100 mg Oral TID PRN    sodium chloride  flush 0.9 % injection 5-40 mL  5-40 mL IntraVENous 2 times per day    sodium chloride  flush 0.9 % injection 5-40 mL  5-40 mL IntraVENous PRN    0.9 % sodium chloride  infusion   IntraVENous PRN    magnesium  sulfate 2000 mg in 50 mL IVPB premix  2,000 mg IntraVENous PRN    ondansetron  (ZOFRAN -ODT) disintegrating tablet 4 mg  4 mg Oral Q8H PRN    Or    ondansetron  (ZOFRAN ) injection 4 mg  4 mg IntraVENous Q6H PRN  polyethylene glycol (GLYCOLAX ) packet 17 g  17 g Oral Daily PRN    acetaminophen  (TYLENOL ) tablet 650 mg  650 mg Oral Q6H PRN    Or    acetaminophen  (TYLENOL ) suppository 650 mg  650 mg Rectal Q6H PRN    albuterol  (PROVENTIL ) (2.5 MG/3ML) 0.083% nebulizer solution 1.25 mg  1.25 mg Nebulization Q2H PRN    pantoprazole  (PROTONIX ) tablet 40 mg  40 mg Oral BID AC    sertraline  (ZOLOFT ) tablet 100 mg  100 mg Oral Daily    escitalopram  (LEXAPRO ) tablet 5 mg  5 mg Oral Daily    HYDROmorphone  (DILAUDID ) tablet 1 mg  1 mg Oral Q4H PRN    HYDROmorphone  (DILAUDID ) tablet 2 mg  2 mg Oral Q4H PRN    lactobacillus (CULTURELLE) capsule 1 capsule  1 capsule Oral Daily with breakfast    glucose chewable tablet 16 g  4 tablet  Oral PRN    dextrose  bolus 10% 125 mL  125 mL IntraVENous PRN    Or    dextrose  bolus 10% 250 mL  250 mL IntraVENous PRN    glucagon  injection 1 mg  1 mg SubCUTAneous PRN    dextrose  10 % infusion   IntraVENous Continuous PRN    insulin  lispro (HUMALOG ,ADMELOG ) injection vial 0-8 Units  0-8 Units SubCUTAneous 4x Daily AC & HS    apixaban  (ELIQUIS ) tablet 5 mg  5 mg Oral BID    metoprolol  tartrate (LOPRESSOR ) tablet 25 mg  25 mg Oral BID    ipratropium 0.5 mg-albuterol  2.5 mg (DUONEB ) nebulizer solution 1 Dose  1 Dose Inhalation Q4H WA RT     ______________________________________________________________________  EXPECTED LENGTH OF STAY:    ACTUAL LENGTH OF STAY:          1                 Cozette GORMAN Fear, MD

## 2024-05-04 LAB — POCT GLUCOSE
POC Glucose: 204 mg/dL — ABNORMAL HIGH (ref 65–117)
POC Glucose: 189 mg/dL — ABNORMAL HIGH (ref 65–117)
POC Glucose: 281 mg/dL — ABNORMAL HIGH (ref 65–117)
POC Glucose: 281 mg/dL — ABNORMAL HIGH (ref 65–117)

## 2024-05-04 LAB — VAS DUP LOWER EXTREMITY VENOUS LEFT: Body Surface Area: 2.38 m2

## 2024-05-04 MED ORDER — CYCLOBENZAPRINE HCL 10 MG PO TABS
10 | Freq: Three times a day (TID) | ORAL | Status: AC
Start: 2024-05-04 — End: 2024-05-05
  Administered 2024-05-04 – 2024-05-05 (×3): 10 mg via ORAL

## 2024-05-04 MED FILL — CYCLOBENZAPRINE HCL 10 MG PO TABS: 10 mg | ORAL | Qty: 1 | Fill #0

## 2024-05-04 MED FILL — METOPROLOL TARTRATE 25 MG PO TABS: 25 mg | ORAL | Qty: 1 | Fill #0

## 2024-05-04 MED FILL — ELIQUIS 5 MG PO TABS: 5 mg | ORAL | Qty: 1 | Fill #0

## 2024-05-04 MED FILL — FUROSEMIDE 10 MG/ML IJ SOLN: 10 mg/mL | INTRAMUSCULAR | Qty: 4 | Fill #0

## 2024-05-04 MED FILL — INSULIN LISPRO 100 UNIT/ML IJ SOLN: 100 [IU]/mL | INTRAMUSCULAR | Qty: 2 | Fill #0

## 2024-05-04 MED FILL — INSULIN LISPRO 100 UNIT/ML IJ SOLN: 100 [IU]/mL | INTRAMUSCULAR | Qty: 4 | Fill #0

## 2024-05-04 MED FILL — ESCITALOPRAM OXALATE 10 MG PO TABS: 10 mg | ORAL | Qty: 1 | Fill #0

## 2024-05-04 MED FILL — METHYLPREDNISOLONE SODIUM SUCC 125 MG IJ SOLR: 125 mg | INTRAMUSCULAR | Qty: 125 | Fill #0

## 2024-05-04 MED FILL — DOXYCYCLINE HYCLATE 100 MG IV SOLR: 100 mg | INTRAVENOUS | Qty: 100 | Fill #0

## 2024-05-04 MED FILL — CULTURELLE PO CAPS: ORAL | Qty: 1 | Fill #0

## 2024-05-04 MED FILL — HYDROMORPHONE HCL 2 MG PO TABS: 2 mg | ORAL | Qty: 1 | Fill #0

## 2024-05-04 MED FILL — IPRATROPIUM-ALBUTEROL 0.5-2.5 (3) MG/3ML IN SOLN: 0.5-2.5 (3) MG/3ML | RESPIRATORY_TRACT | Qty: 3 | Fill #0

## 2024-05-04 MED FILL — PANTOPRAZOLE SODIUM 40 MG PO TBEC: 40 mg | ORAL | Qty: 1 | Fill #0

## 2024-05-04 MED FILL — SERTRALINE HCL 50 MG PO TABS: 50 mg | ORAL | Qty: 2 | Fill #0

## 2024-05-04 NOTE — Plan of Care (Signed)
 Problem: Respiratory - Adult  Goal: Achieves optimal ventilation and oxygenation  05/04/2024 1954 by Chad Arabia, RCP  Outcome: Progressing  05/04/2024 1150 by Judyth Pfeiffer, RN  Outcome: Progressing  05/04/2024 1000 by Sanda Clara, RT  Outcome: Progressing

## 2024-05-04 NOTE — Progress Notes (Signed)
 [facility]  [address] [place], [place] [address]  [phone]    [facility]                                                                                          Hospitalist Progress Note  Cozette GORMAN Fear, MD        Date of Service:  05/04/2024  NAME:  Breanna Lester  DOB:  [birth year]  MRN:  [id number]      Interval history / Subjective:   Patient reports improvement in breathing.  Still with some residual back spasms but overall better     Assessment & Plan:     Acute hypoxic respiratory failure due to COPD exacerbation  - Only on 4 L nasal cannula, typically uses 3 L at baseline  - Wean as tolerated to maintain sats above 90%  -Treat acute issues below    Acute COPD exacerbation  - Rapid flu and COVID-negative  -Continue Solu-Medrol , Pulmicort /Brovana , scheduled nebs  - Continue doxycycline     Bilateral pleural effusions  -Right greater than left  -Continue Lasix   - Recent echo less than a month ago with preserved EF, normal wall motion    Diarrhea  -Continue probiotic  - Recent enteric panel was negative    Left lower extremity edema  -Lower extremity Dopplers pending    Acute on chronic back pain  - Will continue Flexeril     Alcoholic cirrhosis of liver  -INR slightly elevated  - Appears compensated for now    Atrial fibrillation  -On metoprolol  and Eliquis     Type 2 diabetes hyperglycemia  -Recent A1c 8.2  - Continue SSI per protocol    CAD  -Troponins flat  - Continue Eliquis , beta-blocker    Hypertension  -Continue metoprolol         Outisde Records, prior notes, labs, radiology, and medications reviewed     Code status: Full code  DVT prophylaxis: Eliquis        Hospital Problems           Last Modified POA    * (Principal) Acute hypoxic respiratory failure (HCC) 05/02/2024 Yes    Alcoholic cirrhosis of liver (HCC) 05/02/2024 Yes    PAF (paroxysmal atrial fibrillation) (HCC) 05/02/2024 Yes    COPD (chronic obstructive pulmonary disease)  (HCC) 05/02/2024 Yes    Type 2 diabetes mellitus with hyperglycemia, without long-term current use of insulin  (HCC) 05/02/2024 Yes    CAD (coronary artery disease) 05/02/2024 Yes    HTN (hypertension) 05/02/2024 Yes          Review of Systems:   Pertinent items are noted in HPI.       Vital Signs:    Last 24hrs VS reviewed since prior progress note. Most recent are:  Vitals:    05/04/24 1731   BP: 124/67   Pulse:    Resp:    Temp:    SpO2:          Intake/Output Summary (Last 24 hours) at 05/04/2024 1807  Last data filed at 05/04/2024 1036  Gross per  24 hour   Intake 10 ml   Output 1800 ml   Net -1790 ml        Physical Examination:             Constitutional:  No acute distress, cooperative, pleasant    ENT:  Oral mucosa moist, oropharynx benign.    Resp:  CTA bilaterally. No wheezing/rhonchi/rales. No accessory muscle use   CV:  Regular rhythm, normal rate, no murmurs, gallops, rubs    GI:  Soft, non distended, non tender. normoactive bowel sounds, no hepatosplenomegaly     Musculoskeletal:  No edema, warm, 2+ pulses throughout    Neurologic:  Moves all extremities.  AAOx3, CN II-XII reviewed     Psych:  Good insight, Not anxious nor agitated.       Data Review:    Review and/or order of clinical lab test      Labs:     Recent Labs     05/02/24  1444 05/03/24  0557   WBC 5.0 3.0*   HGB 10.1* 10.6*   HCT 33.9* 35.2   PLT 109* 104*     Recent Labs     05/02/24  1444 05/03/24  0557   NA 144 143   K 3.6 4.4   CL 105 103   CO2 26 31*   BUN 21 18   MG  --  2.0     Lab Results   Component Value Date/Time    ALT 12 05/02/2024 02:44 PM    ALT 20 04/24/2024 04:59 AM    ALT 34 04/15/2024 04:25 AM    GLOB 3.0 05/02/2024 02:44 PM    GLOB 3.5 04/24/2024 04:59 AM    GLOB 3.4 04/15/2024 04:25 AM     Lab Results   Component Value Date/Time    INR 1.3 05/03/2024 11:21 AM    INR 1.3 04/23/2024 07:01 PM      Lab Results   Component Value Date/Time    IRON 40 04/24/2024 04:59 AM    TIBC 292 04/24/2024 04:59 AM      No results found for:  RBCF   No results for input(s): PH, PCO2, PO2 in the last 72 hours.  No results found for: CPK  No results found for: CHOL, CHLST, CHOLV, HDL, HDLC, LDL, LDLC  No results found for: GLUCPOC  No results found for: UA      Medications Reviewed:     Current Facility-Administered Medications   Medication Dose Route Frequency    cyclobenzaprine  (FLEXERIL ) tablet 10 mg  10 mg Oral TID    lidocaine  4 % external patch 1 patch  1 patch TransDERmal Daily PRN    ipratropium 0.5 mg-albuterol  2.5 mg (DUONEB ) nebulizer solution 1 Dose  1 Dose Inhalation Q20 Min PRN    methylPREDNISolone  sodium succ (SOLU-MEDROL ) 60 mg in sterile water  0.96 mL injection  60 mg IntraVENous Q8H    budesonide  (PULMICORT ) nebulizer suspension 500 mcg  0.5 mg Nebulization BID RT    arformoterol  tartrate (BROVANA ) nebulizer solution 15 mcg  15 mcg Nebulization BID RT    furosemide  (LASIX ) injection 40 mg  40 mg IntraVENous BID    doxycycline  (VIBRAMYCIN ) 100 mg in sodium chloride  0.9 % 100 mL IVPB (addEASE)  100 mg IntraVENous Q12H    benzonatate  (TESSALON ) capsule 100 mg  100 mg Oral TID PRN    sodium chloride  flush 0.9 % injection 5-40 mL  5-40 mL IntraVENous 2 times per day    sodium  chloride flush 0.9 % injection 5-40 mL  5-40 mL IntraVENous PRN    0.9 % sodium chloride  infusion   IntraVENous PRN    magnesium  sulfate 2000 mg in 50 mL IVPB premix  2,000 mg IntraVENous PRN    ondansetron  (ZOFRAN -ODT) disintegrating tablet 4 mg  4 mg Oral Q8H PRN    Or    ondansetron  (ZOFRAN ) injection 4 mg  4 mg IntraVENous Q6H PRN    polyethylene glycol (GLYCOLAX ) packet 17 g  17 g Oral Daily PRN    acetaminophen  (TYLENOL ) tablet 650 mg  650 mg Oral Q6H PRN    Or    acetaminophen  (TYLENOL ) suppository 650 mg  650 mg Rectal Q6H PRN    albuterol  (PROVENTIL ) (2.5 MG/3ML) 0.083% nebulizer solution 1.25 mg  1.25 mg Nebulization Q2H PRN    pantoprazole  (PROTONIX ) tablet 40 mg  40 mg Oral BID AC    sertraline  (ZOLOFT ) tablet 100 mg  100 mg Oral  Daily    escitalopram  (LEXAPRO ) tablet 5 mg  5 mg Oral Daily    HYDROmorphone  (DILAUDID ) tablet 1 mg  1 mg Oral Q4H PRN    HYDROmorphone  (DILAUDID ) tablet 2 mg  2 mg Oral Q4H PRN    lactobacillus (CULTURELLE) capsule 1 capsule  1 capsule Oral Daily with breakfast    glucose chewable tablet 16 g  4 tablet Oral PRN    dextrose  bolus 10% 125 mL  125 mL IntraVENous PRN    Or    dextrose  bolus 10% 250 mL  250 mL IntraVENous PRN    glucagon  injection 1 mg  1 mg SubCUTAneous PRN    dextrose  10 % infusion   IntraVENous Continuous PRN    insulin  lispro (HUMALOG ,ADMELOG ) injection vial 0-8 Units  0-8 Units SubCUTAneous 4x Daily AC & HS    apixaban  (ELIQUIS ) tablet 5 mg  5 mg Oral BID    metoprolol  tartrate (LOPRESSOR ) tablet 25 mg  25 mg Oral BID    ipratropium 0.5 mg-albuterol  2.5 mg (DUONEB ) nebulizer solution 1 Dose  1 Dose Inhalation Q4H WA RT     ______________________________________________________________________  EXPECTED LENGTH OF STAY:    ACTUAL LENGTH OF STAY:          2                 Cozette GORMAN Fear, MD

## 2024-05-04 NOTE — Plan of Care (Signed)
 Problem: Occupational Therapy - Adult  Goal: By Discharge: Performs self-care activities at highest level of function for planned discharge setting.  See evaluation for individualized goals.  Description: FUNCTIONAL STATUS PRIOR TO ADMISSION: The patient was independent for ADLs and functional mobility apart from min A for tub transfers and occasional assistance for LB dressing.  HOME SUPPORT: The patient lives with her daughter who works part time outside of the home. They are living in extended stay hotel. Uses 2 L O2 at baseline.    Occupational Therapy Goals:  Initiated 05/04/2024  1.  Patient will perform grooming with Modified Independence in sitting within 7 day(s).  2.  Patient will perform upper body dressing with Supervision within 7 day(s).  3.  Patient will perform toilet transfers with Supervision  within 7 day(s).  4.  Patient will perform all aspects of toileting with Supervision within 7 day(s).  5.  Patient will participate in upper extremity therapeutic exercise/activities with Supervision for 5 minutes within 7 day(s).    6.  Patient will utilize energy conservation techniques during functional activities with verbal cues within 7 day(s).   Outcome: Progressing  OCCUPATIONAL THERAPY EVALUATION    Patient: Breanna Lester (63 y.o. female)  Date: 05/04/2024  Primary Diagnosis: COPD exacerbation (HCC) [J44.1]  New onset of congestive heart failure (HCC) [I50.9]  Acute hypoxic respiratory failure (HCC) [J96.01]         Precautions:                    ASSESSMENT :  The patient is limited from functional baseline by impaired functional mobility and balance, decreased activity tolerance and strength, and back pain following admission for COPD exacerbation and CHF. She was received in bed, A&Ox4 and agreeable to participate but politely declining OOB activity today due to back pain. Pt demo'd intact BUE AROM. She performed grooming ADLs with setup in semisupine and was able to pull herself up in bed using  bedrails. Vitals monitored and stable on 2 L O2 (O2 sats 97%). Remained in bed at end of session with needs met, discussed with RN. Pt is functioning below her baseline at this time and will benefit from continued skilled therapy to address the above impairments.    Functional Outcome Measure:  The patient scored 40/100 on the Barthel Index outcome measure.         PLAN :  Recommendations and Planned Interventions:   self care training, therapeutic activities, functional mobility training, balance training, therapeutic exercise, endurance activities, patient education, home safety training, and family training/education    Frequency/Duration: OT Plan of Care: 5 times/week      Recommendation for discharge: (in order for the patient to meet his/her long term goals):   Continue to assess pending progress, likely HH therapy with assistance for IADLs and ADLs as needed    Other factors to consider for discharge: concern for safely navigating or managing the home environment    IF patient discharges home will need the following DME: continuing to assess with progress       SUBJECTIVE:   Patient stated, "I don't think I can get out of bed right now."  OBJECTIVE DATA SUMMARY:     Past Medical History:   Diagnosis Date    Anxiety     Depression     Gastrointestinal disorder     Heart failure (HCC)     Hyperlipidemia     Hypertension     Liver disease 2009  liver failure    Pneumonia      Past Surgical History:   Procedure Laterality Date    ADENOIDECTOMY  1972    BACK SURGERY      cement discs following a fall    CATARACT REMOVAL      CESAREAN SECTION  1986 and 1993    DILATION AND CURETTAGE OF UTERUS  1999    EGD TRANSORAL BIOPSY SINGLE/MULTIPLE  05/17/2009         HYSTERECTOMY (CERVIX STATUS UNKNOWN)  2008    TONSILLECTOMY  1972    UPPER GASTROINTESTINAL ENDOSCOPY N/A 04/17/2024    ESOPHAGOGASTRODUODENOSCOPY performed by Madlyn Fendt, MD at [facility]          Expanded or extensive additional review of patient history:    Social/Functional History  Lives With: Daughter  Type of Home:  (hotel (extended stay))  Home Layout: One level  Home Access: Level entry  Bathroom Shower/Tub: Electrical engineer: None  Bathroom Accessibility: Not accessible  Home Equipment: Oxygen, Walker - Rolling (Glucose monitor; BP cuff;)  Has the patient had two or more falls in the past year or any fall with injury in the past year?: Yes (1 fall: 01/2024.  getting out of uber tripped over curb skinned knees/bruising/swelling. ED xray negative)  Prior Level of Assist for ADLs: Needs assistance  Bath: Independent  Dressing: Modified independent  Grooming: Independent  Feeding: Independent  Toileting: Independent  Homemaking Responsibilities: No  Prior Level of Assist for Ambulation: Independent household ambulator, with or without device  Prior Level of Assist for Transfers: Independent  Active Driver: No  Mode of Transportation: Bus, Other Corporate investment banker)  Occupation: On disability  Leisure & Hobbies: reading; take care of dog; cooking      Hand Dominance: right     EXAMINATION OF PERFORMANCE DEFICITS:    Cognitive/Behavioral Status:  Orientation  Orientation Level: Oriented X4          Vision/Perceptual:    Vision - Basic Assessment  Patient Visual Report:  (impaired R eye vision)                     Range of Motion:   AROM: Generally decreased, functional  PROM: Within functional limits      Strength:  Strength: Generally decreased, functional      Coordination:  Coordination: Within functional limits            Functional Mobility and Transfers for ADLs:    Bed Mobility:     Bed Mobility Training  Scooting: Supervision        ADL Assessment:          Feeding: Independent       Grooming: Setup  Grooming Skilled Clinical Factors: semisupine due to back pain    UE Bathing: Minimal assistance       Product Used : Bath wipes    LE Bathing: Dependent/Total       UE Dressing: Minimal assistance       LE Dressing:  Dependent/Total       Toileting: Moderate assistance  Barthel Index:    Barthel Index Scale  Feeding: Independent, Able to apply any necessary device. Feeds in reasonable time  Bathing: Cannot perform activity  Grooming: Washes face, combs hair, brushes teeth, shaves (manages plug if electric razor)  Dressing: Needs help, but does at least half of task within reasonable time  Bowel Control: No accidents. Able to use enema or suppository if needed  Bladder Control: No accidents. Able to care for collecting device, if used  Toilet Transfers: Cannot perform activity  Chair/Bed Trannsfers: Cannot perform activity  Ambulation: Cannot perform activity  Stairs: Cannot perform activity  Total Barthel Index Score: 40       The Barthel ADL Index: Guidelines  1. The index should be used as a record of what a patient does, not as a record of what a patient could do.  2. The main aim is to establish degree of independence from any help, physical or verbal, however minor and for whatever reason.  3. The need for supervision renders the patient not independent.  4. A patient's performance should be established using the best available evidence. Asking the patient, friends/relatives and nurses are the usual sources, but direct observation and common sense are also important. However direct testing is not needed.  5. Usually the patient's performance over the preceding 24-48 hours is important, but occasionally longer periods will be relevant.  6. Middle categories imply that the patient supplies over 50 per cent of the effort.  7. Use of aids to be independent is allowed.    Score Interpretation (from Sinoff 1997)   80-100 Independent   60-79 Minimally independent   40-59 Partially dependent   20-39  Very dependent   <20 Totally dependent     -Mahoney, F.l., Barthel, D.W. (1965). Functional evaluation: the Barthel Index. [place] [address] [place] (14)2.  -Sinoff, G., Ore, L. (1997). The Barthel activities of daily living index: self-reporting versus actual performance in the old (> or = 75 years). Journal of American Geriatric Society 45(7), [phone].   -Fleeta cotton Ludlow, J.J.M.F, Orman ROES., Oneita MERYL Sebastian DELORSE. (1999). Measuring the change in disability after inpatient rehabilitation; comparison of the responsiveness of the Barthel Index and Functional Independence Measure. Journal of Neurology, Neurosurgery, and Psychiatry, 66(4), [phone].  Marea Chillington, N.J.A, Scholte op [place],  W.J.M, & Koopmanschap, M.A. (2004) Assessment of post-stroke quality of life in cost-effectiveness studies: The usefulness of the Barthel Index and the EuroQoL-5D. Quality of Life Research, 13, [phone]  Treatment Rendered:    Educated pt on importance of functional mobility and participating in ADLs for overall health and to prevent deconditioning.      Pain Rating:  Pt reported back pain (chronic) but did not rate  Pain Intervention(s):   nursing notified and addressing    Activity Tolerance:   Fair     After treatment:   Patient left in no apparent distress in bed, Call bell within reach, Bed/ chair alarm activated, and Side rails x3    COMMUNICATION/EDUCATION:   The patient's plan of care was discussed with: physical therapist and registered nurse         Thank you for this referral.  Lauraine FORBES Dross, OT  Minutes: 22    Occupational Therapy Evaluation Charge Determination   History Examination Decision-Making   LOW Complexity : Brief history review  MEDIUM Complexity: 3-5 Performance deficits relating to physical,  cognitive, or psychosocial skills that result in activity limitations and/or participation restrictions MEDIUM Complexity: Patient may present with comorbidities that affect occupational performance. Minimal to moderate modifications of tasks or assist (eg. physical or verbal) with assist is necessary to enable pt to complete eval   Based on the above components, the patient evaluation is determined to be of the following complexity level: Low

## 2024-05-04 NOTE — Plan of Care (Signed)
 Problem: Chronic Conditions and Co-morbidities  Goal: Patient's chronic conditions and co-morbidity symptoms are monitored and maintained or improved  Outcome: Progressing     Problem: Safety - Adult  Goal: Free from fall injury  Outcome: Progressing     Problem: Pain  Goal: Verbalizes/displays adequate comfort level or baseline comfort level  Outcome: Progressing     Problem: Respiratory - Adult  Goal: Achieves optimal ventilation and oxygenation  05/04/2024 0346 by Wynn Rend, RN  Outcome: Progressing  05/03/2024 1959 by Chad Arabia, RCP  Outcome: Progressing     Problem: Skin/Tissue Integrity  Goal: Skin integrity remains intact  Description: 1.  Monitor for areas of redness and/or skin breakdown  2.  Assess vascular access sites hourly  3.  Every 4-6 hours minimum:  Change oxygen saturation probe site  4.  Every 4-6 hours:  If on nasal continuous positive airway pressure, respiratory therapy assess nares and determine need for appliance change or resting period  Outcome: Progressing     Problem: Discharge Planning  Goal: Discharge to home or other facility with appropriate resources  Outcome: Progressing     Problem: Cardiovascular - Adult  Goal: Maintains optimal cardiac output and hemodynamic stability  Outcome: Progressing     Problem: Skin/Tissue Integrity - Adult  Goal: Skin integrity remains intact  Description: 1.  Monitor for areas of redness and/or skin breakdown  2.  Assess vascular access sites hourly  3.  Every 4-6 hours minimum:  Change oxygen saturation probe site  4.  Every 4-6 hours:  If on nasal continuous positive airway pressure, respiratory therapy assess nares and determine need for appliance change or resting period  Outcome: Progressing     Problem: Musculoskeletal - Adult  Goal: Return mobility to safest level of function  Outcome: Progressing  Goal: Return ADL status to a safe level of function  Outcome: Progressing     Problem: Infection - Adult  Goal: Absence of infection  during hospitalization  Outcome: Progressing     Problem: Metabolic/Fluid and Electrolytes - Adult  Goal: Electrolytes maintained within normal limits  Outcome: Progressing  Goal: Glucose maintained within prescribed range  Outcome: Progressing     Problem: Physical Therapy - Adult  Goal: By Discharge: Performs mobility at highest level of function for planned discharge setting.  See evaluation for individualized goals.  Description: FUNCTIONAL STATUS PRIOR TO ADMISSION: Patient was independent and active without use of DME.    HOME SUPPORT PRIOR TO ADMISSION: The patient lived with daughter but did not require assistance. Lives with duaghter that is high level autistic that works part time. Both live together in Extended stay motel    Physical Therapy Goals  Initiated 05/03/2024  1.  Patient will move from supine to sit and sit to supine in bed with modified independence within 7 day(s).    2.  Patient will perform sit to stand with modified independence within 7 day(s).  3.  Patient will transfer from bed to chair and chair to bed with modified independence using the least restrictive device within 7 day(s).  4.  Patient will ambulate with modified independence for 150 feet with the least restrictive device within 7 day(s).       05/03/2024 1609 by Iven Greig DEL, PT  Outcome: Progressing

## 2024-05-05 ENCOUNTER — Encounter: Payer: Medicare (Managed Care) | Primary: Diagnostic Radiology

## 2024-05-05 ENCOUNTER — Inpatient Hospital Stay: Admit: 2024-05-05 | Payer: Medicare (Managed Care) | Primary: Diagnostic Radiology

## 2024-05-05 LAB — BASIC METABOLIC PANEL
Anion Gap: 11 mmol/L (ref 2–14)
BUN/Creatinine Ratio: 38 — ABNORMAL HIGH (ref 12–20)
BUN: 35 mg/dL — ABNORMAL HIGH (ref 8–23)
CO2: 36 mmol/L — ABNORMAL HIGH (ref 20–29)
Calcium: 8.5 mg/dL — ABNORMAL LOW (ref 8.8–10.2)
Chloride: 97 mmol/L — ABNORMAL LOW (ref 98–107)
Creatinine: 0.92 mg/dL (ref 0.60–1.00)
Est, Glom Filt Rate: 70 ml/min/1.73m2 (ref >59)
Glucose: 131 mg/dL — ABNORMAL HIGH (ref 65–100)
Potassium: 3.3 mmol/L — ABNORMAL LOW (ref 3.5–5.1)
Sodium: 144 mmol/L (ref 136–145)

## 2024-05-05 LAB — CBC WITH AUTO DIFFERENTIAL
Basophils %: 0.1 % (ref 0.0–1.0)
Basophils Absolute: 0.01 K/UL (ref 0.00–0.10)
Eosinophils %: 0 % (ref 0.0–7.0)
Eosinophils Absolute: 0 K/UL (ref 0.00–0.40)
Hematocrit: 36.7 % (ref 35.0–47.0)
Hemoglobin: 11.2 g/dL — ABNORMAL LOW (ref 11.5–16.0)
Immature Granulocytes %: 0.5 % (ref 0.0–0.5)
Immature Granulocytes Absolute: 0.04 K/UL (ref 0.00–0.04)
Lymphocytes %: 11.3 % — ABNORMAL LOW (ref 12.0–49.0)
Lymphocytes Absolute: 0.94 K/UL (ref 0.80–3.50)
MCH: 26.5 pg (ref 26.0–34.0)
MCHC: 30.5 g/dL (ref 30.0–36.5)
MCV: 87 FL (ref 80.0–99.0)
MPV: 11.5 FL (ref 8.9–12.9)
Monocytes %: 7.6 % (ref 5.0–13.0)
Monocytes Absolute: 0.63 K/UL (ref 0.00–1.00)
Neutrophils %: 80.5 % — ABNORMAL HIGH (ref 32.0–75.0)
Neutrophils Absolute: 6.68 K/UL (ref 1.80–8.00)
Nucleated RBCs: 0 /100{WBCs}
Platelets: 136 K/uL — ABNORMAL LOW (ref 150–400)
RBC: 4.22 M/uL (ref 3.80–5.20)
RDW: 15.3 % — ABNORMAL HIGH (ref 11.5–14.5)
WBC: 8.3 K/uL (ref 3.6–11.0)
nRBC: 0 K/uL (ref 0.00–0.01)

## 2024-05-05 LAB — PHOSPHORUS: Phosphorus: 3.1 mg/dL (ref 2.5–4.5)

## 2024-05-05 LAB — MAGNESIUM: Magnesium: 2 mg/dL (ref 1.6–2.4)

## 2024-05-05 LAB — BRAIN NATRIURETIC PEPTIDE: NT Pro-BNP: 1427 pg/mL — ABNORMAL HIGH (ref 0–125)

## 2024-05-05 LAB — POCT GLUCOSE
POC Glucose: 259 mg/dL — ABNORMAL HIGH (ref 65–117)
POC Glucose: 240 mg/dL — ABNORMAL HIGH (ref 65–117)
POC Glucose: 191 mg/dL — ABNORMAL HIGH (ref 65–117)
POC Glucose: 249 mg/dL — ABNORMAL HIGH (ref 65–117)

## 2024-05-05 LAB — AMMONIA: Ammonia: 36 umol/L (ref 11–51)

## 2024-05-05 LAB — C-REACTIVE PROTEIN: CRP: 1.1 mg/dL — ABNORMAL HIGH (ref 0.0–0.5)

## 2024-05-05 MED ORDER — IPRATROPIUM-ALBUTEROL 0.5-2.5 (3) MG/3ML IN SOLN
0.5-2.5 | Freq: Four times a day (QID) | RESPIRATORY_TRACT | Status: DC
Start: 2024-05-05 — End: 2024-05-09
  Administered 2024-05-05 – 2024-05-09 (×12): 1 via RESPIRATORY_TRACT

## 2024-05-05 MED ORDER — GUAIFENESIN ER 600 MG PO TB12
600 | Freq: Two times a day (BID) | ORAL | Status: DC
Start: 2024-05-05 — End: 2024-05-09
  Administered 2024-05-05 – 2024-05-09 (×9): 600 mg via ORAL

## 2024-05-05 MED ORDER — SULFUR HEXAFLUORIDE MICROSPH 60.7-25 MG IJ SUSR
60.7-25 | Freq: Once | INTRAMUSCULAR | Status: AC | PRN
Start: 2024-05-05 — End: 2024-05-05
  Administered 2024-05-05: 22:00:00 2 mL via INTRAVENOUS

## 2024-05-05 MED ORDER — POTASSIUM BICARB-CITRIC ACID 10 MEQ PO TBEF
10 | Freq: Two times a day (BID) | ORAL | Status: AC
Start: 2024-05-05 — End: 2024-05-05
  Administered 2024-05-05 – 2024-05-06 (×2): 40 meq via ORAL

## 2024-05-05 MED ORDER — SULFUR HEXAFLUORIDE MICROSPH 60.7-25 MG IJ SUSR
60.7-25 | INTRAMUSCULAR | Status: AC
Start: 2024-05-05 — End: 2024-05-05

## 2024-05-05 MED ORDER — METHYLPREDNISOLONE SODIUM SUCC 40 MG IJ SOLR
40 | Freq: Three times a day (TID) | INTRAMUSCULAR | Status: DC
Start: 2024-05-05 — End: 2024-05-07
  Administered 2024-05-05 – 2024-05-07 (×8): 40 mg via INTRAVENOUS

## 2024-05-05 MED ORDER — BUMETANIDE 0.25 MG/ML IJ SOLN
0.25 | Freq: Every day | INTRAMUSCULAR | Status: DC
Start: 2024-05-05 — End: 2024-05-07
  Administered 2024-05-06: 12:00:00 1 mg via INTRAVENOUS

## 2024-05-05 MED ORDER — ACETAZOLAMIDE 250 MG PO TABS
250 | Freq: Two times a day (BID) | ORAL | Status: AC
Start: 2024-05-05 — End: 2024-05-05
  Administered 2024-05-05 – 2024-05-06 (×2): 250 mg via ORAL

## 2024-05-05 MED FILL — PANTOPRAZOLE SODIUM 40 MG PO TBEC: 40 mg | ORAL | Qty: 1 | Fill #0

## 2024-05-05 MED FILL — INSULIN LISPRO 100 UNIT/ML IJ SOLN: 100 [IU]/mL | INTRAMUSCULAR | Qty: 2 | Fill #0

## 2024-05-05 MED FILL — METHYLPREDNISOLONE SODIUM SUCC 40 MG IJ SOLR: 40 mg | INTRAMUSCULAR | Qty: 40 | Fill #0

## 2024-05-05 MED FILL — BUMETANIDE 0.25 MG/ML IJ SOLN: 0.25 mg/mL | INTRAMUSCULAR | Qty: 4 | Fill #0

## 2024-05-05 MED FILL — HYDROMORPHONE HCL 2 MG PO TABS: 2 mg | ORAL | Qty: 1 | Fill #0

## 2024-05-05 MED FILL — INSULIN LISPRO 100 UNIT/ML IJ SOLN: 100 [IU]/mL | INTRAMUSCULAR | Qty: 4 | Fill #0

## 2024-05-05 MED FILL — GUAIFENESIN ER 600 MG PO TB12: 600 mg | ORAL | Qty: 1 | Fill #0

## 2024-05-05 MED FILL — DOXYCYCLINE HYCLATE 100 MG IV SOLR: 100 mg | INTRAVENOUS | Qty: 100 | Fill #0

## 2024-05-05 MED FILL — ESCITALOPRAM OXALATE 10 MG PO TABS: 10 mg | ORAL | Qty: 1 | Fill #0

## 2024-05-05 MED FILL — CYCLOBENZAPRINE HCL 10 MG PO TABS: 10 mg | ORAL | Qty: 1 | Fill #0

## 2024-05-05 MED FILL — EFFER-K 10 MEQ PO TBEF: 10 meq | ORAL | Qty: 4 | Fill #0

## 2024-05-05 MED FILL — LUMASON 60.7-25 MG IJ SUSR: 60.7-25 mg | INTRAMUSCULAR | Qty: 5 | Fill #0

## 2024-05-05 MED FILL — ACETAZOLAMIDE 250 MG PO TABS: 250 mg | ORAL | Qty: 1 | Fill #0

## 2024-05-05 MED FILL — CULTURELLE PO CAPS: ORAL | Qty: 1 | Fill #0

## 2024-05-05 MED FILL — POLYETHYLENE GLYCOL 3350 17 G PO PACK: 17 g | ORAL | Qty: 1 | Fill #0

## 2024-05-05 MED FILL — SERTRALINE HCL 50 MG PO TABS: 50 mg | ORAL | Qty: 2 | Fill #0

## 2024-05-05 MED FILL — ELIQUIS 5 MG PO TABS: 5 mg | ORAL | Qty: 1 | Fill #0

## 2024-05-05 MED FILL — METOPROLOL TARTRATE 25 MG PO TABS: 25 mg | ORAL | Qty: 1 | Fill #0

## 2024-05-05 NOTE — Progress Notes (Signed)
 05/05/24 0910   Spirometry Assessment   COPD Exacerbations in last year 2  (most recent 8/19-8/22)   COPD Assessment (CAT Score)   $RT COPD Assessment Yes   GOLD Staging   Group Group E     Pt recent readm 9/12 on 6L NC.  Pt now on 2L and wears 2-3L O2 at baseline.  Pt former smoker, quit 2009.  Pt has not established with Pulmonary since last adm but does have Ansible established.  Will try to schedule new Pulmonary pt appt at discharge.

## 2024-05-05 NOTE — Progress Notes (Signed)
 [facility]  [address] [place], [place] [address]  [phone]        Hospitalist Progress Note      NAME: Breanna Lester   DOB:  09/16/1960  MRM:  [id number]    Date/Time of service: 05/05/2024  11:46 AM       Subjective:     Chief Complaint:  Patient was personally seen and examined by me during this time period.  Chart reviewed.  Still with dyspnea, wheezing       Objective:       Vitals:       Last 24hrs VS reviewed since prior progress note. Most recent are:    Vitals:    05/05/24 0904   BP: 96/83   Pulse: 74   Resp: 18   Temp: 98.2 F (36.8 C)   SpO2: 95%     SpO2 Readings from Last 6 Encounters:   05/05/24 95%   04/25/24 96%   04/17/24 97%   04/11/24 95%   02/26/24 94%   02/18/24 92%          Intake/Output Summary (Last 24 hours) at 05/05/2024 1146  Last data filed at 05/05/2024 9095  Gross per 24 hour   Intake 300 ml   Output 1200 ml   Net -900 ml        Exam:     Physical Exam:    Gen:  Well-developed, well-nourished, morbidly obese, mild distress  HEENT:  Pink conjunctivae, PERRL, hearing intact to voice, moist mucous membranes  Neck:  Supple, without masses, thyroid non-tender  Resp:  + accessory muscle use, scattered wheezing  Card:  No murmurs, normal S1, S2 without thrills, bruits or peripheral edema  Abd:  Soft, non-tender, non-distended, normoactive bowel sounds are present  Musc:  No cyanosis or clubbing  Skin:  No rashes   Neuro:  Cranial nerves 3-12 are grossly intact, follows commands appropriately  Psych:  Good insight, oriented to person, place and time, alert    Medications Reviewed: (see below)    Lab Data Reviewed: (see below)    ______________________________________________________________________    Medications:     Current Facility-Administered Medications   Medication Dose Route Frequency    bumetanide  (BUMEX ) injection 1 mg  1 mg IntraVENous Daily    acetaZOLAMIDE  (DIAMOX ) tablet 250 mg  250 mg Oral BID    potassium bicarb-citric acid  (EFFER-K )  effervescent tablet 40 mEq  40 mEq Oral BID    ipratropium 0.5 mg-albuterol  2.5 mg (DUONEB ) nebulizer solution 1 Dose  1 Dose Inhalation Q6H WA RT    methylPREDNISolone  sodium succ (SOLU-MEDROL ) 40 mg in sterile water  1 mL injection  40 mg IntraVENous Q8H    guaiFENesin  (MUCINEX ) extended release tablet 600 mg  600 mg Oral BID    lidocaine  4 % external patch 1 patch  1 patch TransDERmal Daily PRN    ipratropium 0.5 mg-albuterol  2.5 mg (DUONEB ) nebulizer solution 1 Dose  1 Dose Inhalation Q20 Min PRN    budesonide  (PULMICORT ) nebulizer suspension 500 mcg  0.5 mg Nebulization BID RT    arformoterol  tartrate (BROVANA ) nebulizer solution 15 mcg  15 mcg Nebulization BID RT    doxycycline  (VIBRAMYCIN ) 100 mg in sodium chloride  0.9 % 100 mL IVPB (addEASE)  100 mg IntraVENous Q12H    benzonatate  (TESSALON ) capsule 100 mg  100 mg Oral TID PRN    sodium chloride  flush 0.9 % injection 5-40 mL  5-40 mL IntraVENous 2 times  per day    sodium chloride  flush 0.9 % injection 5-40 mL  5-40 mL IntraVENous PRN    0.9 % sodium chloride  infusion   IntraVENous PRN    ondansetron  (ZOFRAN -ODT) disintegrating tablet 4 mg  4 mg Oral Q8H PRN    Or    ondansetron  (ZOFRAN ) injection 4 mg  4 mg IntraVENous Q6H PRN    polyethylene glycol (GLYCOLAX ) packet 17 g  17 g Oral Daily PRN    acetaminophen  (TYLENOL ) tablet 650 mg  650 mg Oral Q6H PRN    Or    acetaminophen  (TYLENOL ) suppository 650 mg  650 mg Rectal Q6H PRN    albuterol  (PROVENTIL ) (2.5 MG/3ML) 0.083% nebulizer solution 1.25 mg  1.25 mg Nebulization Q2H PRN    pantoprazole  (PROTONIX ) tablet 40 mg  40 mg Oral BID AC    sertraline  (ZOLOFT ) tablet 100 mg  100 mg Oral Daily    escitalopram  (LEXAPRO ) tablet 5 mg  5 mg Oral Daily    HYDROmorphone  (DILAUDID ) tablet 1 mg  1 mg Oral Q4H PRN    HYDROmorphone  (DILAUDID ) tablet 2 mg  2 mg Oral Q4H PRN    lactobacillus (CULTURELLE) capsule 1 capsule  1 capsule Oral Daily with breakfast    glucose chewable tablet 16 g  4 tablet Oral PRN    dextrose   bolus 10% 125 mL  125 mL IntraVENous PRN    Or    dextrose  bolus 10% 250 mL  250 mL IntraVENous PRN    glucagon  injection 1 mg  1 mg SubCUTAneous PRN    dextrose  10 % infusion   IntraVENous Continuous PRN    insulin  lispro (HUMALOG ,ADMELOG ) injection vial 0-8 Units  0-8 Units SubCUTAneous 4x Daily AC & HS    apixaban  (ELIQUIS ) tablet 5 mg  5 mg Oral BID    metoprolol  tartrate (LOPRESSOR ) tablet 25 mg  25 mg Oral BID          Lab Review:     Recent Labs     05/02/24  1444 05/03/24  0557 05/05/24  0606   WBC 5.0 3.0* 8.3   HGB 10.1* 10.6* 11.2*   HCT 33.9* 35.2 36.7   PLT 109* 104* 136*     Recent Labs     05/02/24  1444 05/03/24  0557 05/03/24  1121 05/05/24  0606   NA 144 143  --  144   K 3.6 4.4  --  3.3*   CL 105 103  --  97*   CO2 26 31*  --  36*   BUN 21 18  --  35*   MG  --  2.0  --  2.0   PHOS  --   --   --  3.1   ALT 12  --   --   --    INR  --   --  1.3*  --      No results found for: GLUCPOC       Assessment / Plan:     63 yo Hx of HTN, DM, Pafib on Eliquis , COPD on 3L, pancreatitis, etoh cirrhosis, presented w/ COPD exacerbation     1) Acute on chronic resp failure/hypoxia: slowly improving.  Due to COPD.  Chest CTA neg for PE.  Normally on 3L O2 at home.  Will wean O2 as tolerated.  Use Diamox  to control met alkalosis    2) Acute COPD exacerbation: slow to improve.  Unclear trigger.  Flu and COVID  negative.  Cont  IV solumedrol, duonebs, Doxy.  Consult Pulm    3) Bilateral pleural effusions: likely due to diastolic dysfunction from COPD.  Recent Echo w/ EF 61%.  Will cont IV bumex  and diamox  for fluid control    4) HTN/Pafib: cont metoprolol , eliquis     5) DM type 2: A1C 8.2%.  cont SSI    6) Chronic pain: cont flexeril     7) Etoh cirrhosis: synthetic function intact.  F/u outpatient     8) Hypokalemia: replete     **Prior records, notes, labs, radiology, and medications reviewed in [facility]**    Total time spent with patient care: 45 Minutes **I personally saw and examined the patient during this  time period**                 Care Plan discussed with: Patient, nursing     Discussed:  Care Plan    Prophylaxis:  eliquis     Disposition:  HH PT, OT, RN           ___________________________________________________    Attending Physician: Rodrick Has, MD

## 2024-05-05 NOTE — Plan of Care (Signed)
 Problem: Chronic Conditions and Co-morbidities  Goal: Patient's chronic conditions and co-morbidity symptoms are monitored and maintained or improved  Outcome: Progressing     Problem: Safety - Adult  Goal: Free from fall injury  Outcome: Progressing     Problem: Pain  Goal: Verbalizes/displays adequate comfort level or baseline comfort level  Outcome: Progressing     Problem: Respiratory - Adult  Goal: Achieves optimal ventilation and oxygenation  05/05/2024 0330 by Wynn Rend, RN  Outcome: Progressing  05/04/2024 1954 by Chad Arabia, RCP  Outcome: Progressing     Problem: Skin/Tissue Integrity  Goal: Skin integrity remains intact  Description: 1.  Monitor for areas of redness and/or skin breakdown  2.  Assess vascular access sites hourly  3.  Every 4-6 hours minimum:  Change oxygen saturation probe site  4.  Every 4-6 hours:  If on nasal continuous positive airway pressure, respiratory therapy assess nares and determine need for appliance change or resting period  Outcome: Progressing     Problem: Discharge Planning  Goal: Discharge to home or other facility with appropriate resources  Outcome: Progressing     Problem: Cardiovascular - Adult  Goal: Maintains optimal cardiac output and hemodynamic stability  Outcome: Progressing     Problem: Skin/Tissue Integrity - Adult  Goal: Skin integrity remains intact  Description: 1.  Monitor for areas of redness and/or skin breakdown  2.  Assess vascular access sites hourly  3.  Every 4-6 hours minimum:  Change oxygen saturation probe site  4.  Every 4-6 hours:  If on nasal continuous positive airway pressure, respiratory therapy assess nares and determine need for appliance change or resting period  Outcome: Progressing     Problem: Musculoskeletal - Adult  Goal: Return mobility to safest level of function  Outcome: Progressing  Goal: Return ADL status to a safe level of function  Outcome: Progressing     Problem: Infection - Adult  Goal: Absence of infection  during hospitalization  Outcome: Progressing     Problem: Metabolic/Fluid and Electrolytes - Adult  Goal: Electrolytes maintained within normal limits  Outcome: Progressing  Goal: Glucose maintained within prescribed range  Outcome: Progressing

## 2024-05-05 NOTE — Consults (Signed)
 Name: Breanna Lester Breanna Lester: [facility]   DOB: [birth year] Admit Date: 05/02/2024   Phone: [phone]  Room: 511/01   PCP: Unknown, Provider, NP-C  MRN: [id number]   Date: 05/05/2024  Code: Full Code        HPI:      Chart and notes reviewed. Data reviewed. I review the patient's current medications in the medical record at each encounter.  I have evaluated and examined the patient.     11:08 AM       History was obtained from patient.     I was asked by Breanna LOISE Coombs, MD to see Breanna Lester in consultation for a chief complaint of COPD exacerbation, pleural effusion.    History of Present Illness:  Breanna Lester is a very pleasant, 63 yo lady with a PMH of anxiety/depression, hypertension, hyperlipidemia, COPD (no PFTs to review), obesity, DM, a-fib that presented to [facility] with worsening shortness of breath.  She was recently hospitalized from 9/3-95 with acute GI bleed and 8/28-8/28 with sepsis thought to be due to pneumonia. She was treated while hospitalized with CTX and discharged home on CTX/Azithromycin .   She reports that she continued to feel short of breath and has been using a rescue inhaler at least 6-7 times per day. She does not have a nebulizer at home. She was sent home with oxygen after her recent discharge and is wearing 2L.  She does not use a maintenance inhaler at home. She has never been tested for OSA.   She smoked 1ppd for about 15 years and quit 15 years ago.  Her mother had COPD.  She saw a pulmonologist once in [place], but never had PFTs.    Images:    Chest CT 8/26: new patchy right upper lobe asd    Chest CTA 9/14: no PE.  No aortic aneurysm. Moderate right and small left sided pleural effusion with bibasilar atx    WBC 8.3  Pro-BNP 1427--3,447 on admission       Past Medical History:   Diagnosis Date    Anxiety     Depression     Gastrointestinal disorder     Heart failure (HCC)     Hyperlipidemia     Hypertension     Liver disease 2009    liver failure     Pneumonia        Past Surgical History:   Procedure Laterality Date    ADENOIDECTOMY  1972    BACK SURGERY      cement discs following a fall    CATARACT REMOVAL      CESAREAN SECTION  1986 and 1993    DILATION AND CURETTAGE OF UTERUS  1999    EGD TRANSORAL BIOPSY SINGLE/MULTIPLE  05/17/2009         HYSTERECTOMY (CERVIX STATUS UNKNOWN)  2008    TONSILLECTOMY  1972    UPPER GASTROINTESTINAL ENDOSCOPY N/A 04/17/2024    ESOPHAGOGASTRODUODENOSCOPY performed by Madlyn Fendt, MD at [facility] ENDOSCOPY       Family History   Problem Relation Age of Onset    Cancer Mother     Heart Disease Father        Social History     Tobacco Use    Smoking status: Former     Current packs/day: 0.00     Types: Cigarettes     Quit date: 02/28/2008     Years since quitting: 16.1    Smokeless tobacco: Never   Substance  Use Topics    Alcohol use: Never       Allergies   Allergen Reactions    Latex Rash       Current Facility-Administered Medications   Medication Dose Route Frequency    bumetanide  (BUMEX ) injection 1 mg  1 mg IntraVENous Daily    acetaZOLAMIDE  (DIAMOX ) tablet 250 mg  250 mg Oral BID    potassium bicarb-citric acid  (EFFER-K ) effervescent tablet 40 mEq  40 mEq Oral BID    ipratropium 0.5 mg-albuterol  2.5 mg (DUONEB ) nebulizer solution 1 Dose  1 Dose Inhalation Q6H WA RT    methylPREDNISolone  sodium succ (SOLU-MEDROL ) 40 mg in sterile water  1 mL injection  40 mg IntraVENous Q8H    guaiFENesin  (MUCINEX ) extended release tablet 600 mg  600 mg Oral BID    lidocaine  4 % external patch 1 patch  1 patch TransDERmal Daily PRN    ipratropium 0.5 mg-albuterol  2.5 mg (DUONEB ) nebulizer solution 1 Dose  1 Dose Inhalation Q20 Min PRN    budesonide  (PULMICORT ) nebulizer suspension 500 mcg  0.5 mg Nebulization BID RT    arformoterol  tartrate (BROVANA ) nebulizer solution 15 mcg  15 mcg Nebulization BID RT    doxycycline  (VIBRAMYCIN ) 100 mg in sodium chloride  0.9 % 100 mL IVPB (addEASE)  100 mg IntraVENous Q12H    benzonatate  (TESSALON ) capsule 100 mg   100 mg Oral TID PRN    sodium chloride  flush 0.9 % injection 5-40 mL  5-40 mL IntraVENous 2 times per day    sodium chloride  flush 0.9 % injection 5-40 mL  5-40 mL IntraVENous PRN    0.9 % sodium chloride  infusion   IntraVENous PRN    ondansetron  (ZOFRAN -ODT) disintegrating tablet 4 mg  4 mg Oral Q8H PRN    Or    ondansetron  (ZOFRAN ) injection 4 mg  4 mg IntraVENous Q6H PRN    polyethylene glycol (GLYCOLAX ) packet 17 g  17 g Oral Daily PRN    acetaminophen  (TYLENOL ) tablet 650 mg  650 mg Oral Q6H PRN    Or    acetaminophen  (TYLENOL ) suppository 650 mg  650 mg Rectal Q6H PRN    albuterol  (PROVENTIL ) (2.5 MG/3ML) 0.083% nebulizer solution 1.25 mg  1.25 mg Nebulization Q2H PRN    pantoprazole  (PROTONIX ) tablet 40 mg  40 mg Oral BID AC    sertraline  (ZOLOFT ) tablet 100 mg  100 mg Oral Daily    escitalopram  (LEXAPRO ) tablet 5 mg  5 mg Oral Daily    HYDROmorphone  (DILAUDID ) tablet 1 mg  1 mg Oral Q4H PRN    HYDROmorphone  (DILAUDID ) tablet 2 mg  2 mg Oral Q4H PRN    lactobacillus (CULTURELLE) capsule 1 capsule  1 capsule Oral Daily with breakfast    glucose chewable tablet 16 g  4 tablet Oral PRN    dextrose  bolus 10% 125 mL  125 mL IntraVENous PRN    Or    dextrose  bolus 10% 250 mL  250 mL IntraVENous PRN    glucagon  injection 1 mg  1 mg SubCUTAneous PRN    dextrose  10 % infusion   IntraVENous Continuous PRN    insulin  lispro (HUMALOG ,ADMELOG ) injection vial 0-8 Units  0-8 Units SubCUTAneous 4x Daily AC & HS    apixaban  (ELIQUIS ) tablet 5 mg  5 mg Oral BID    metoprolol  tartrate (LOPRESSOR ) tablet 25 mg  25 mg Oral BID         REVIEW OF SYSTEMS   Negative except as stated in the HPI.  Physical Exam:   Vitals:    05/05/24 0904   BP: 96/83   Pulse: 74   Resp: 18   Temp: 98.2 F (36.8 C)   SpO2: 95%       General:  Alert, cooperative, no distress, appears stated age.   Head:  Normocephalic, without obvious abnormality, atraumatic.   Eyes:  Conjunctivae/corneas clear.    Nose: Nares normal. Septum midline. Mucosa normal.     Throat: Lips, mucosa, and tongue normal. Teeth and gums normal.   Neck: Supple, symmetrical, trachea midline, no adenopathy   Back:   Symmetric, no curvature. ROM normal.   Lungs:   Wheezing bilaterally   Chest wall:  No tenderness or deformity.   Heart:  Regular rate and rhythm, S1, S2 normal, no murmur, click, rub or gallop.   Abdomen:   Soft, non-tender. Bowel sounds normal.    Extremities: Extremities normal, atraumatic, no cyanosis or edema.   Pulses: 2+ and symmetric all extremities.   Skin: Skin color, texture, turgor normal   Neurologic: Grossly nonfocal       Lab Results   Component Value Date/Time    NA 144 05/05/2024 06:06 AM    K 3.3 05/05/2024 06:06 AM    CL 97 05/05/2024 06:06 AM    CO2 36 05/05/2024 06:06 AM    BUN 35 05/05/2024 06:06 AM    MG 2.0 05/05/2024 06:06 AM    PHOS 3.1 05/05/2024 06:06 AM       Lab Results   Component Value Date/Time    WBC 8.3 05/05/2024 06:06 AM    HGB 11.2 05/05/2024 06:06 AM    PLT 136 05/05/2024 06:06 AM    MCV 87.0 05/05/2024 06:06 AM       Lab Results   Component Value Date/Time    INR 1.3 05/03/2024 11:21 AM    GLOB 3.0 05/02/2024 02:44 PM       Lab Results   Component Value Date/Time    IRON 40 04/24/2024 04:59 AM    TIBC 292 04/24/2024 04:59 AM       Lab Results   Component Value Date/Time    CRP 1.1 05/05/2024 06:06 AM    ANA Negative 04/15/2024 03:15 PM    TSH 3.57 03/19/2022 12:25 PM        @BRIEFLAB24 (ph,phi,pco2,pco2i,po2,po2i,hco3,hco3i,fio2,fio2i)@    No results found for: CPK, BNP     No components found for: CULT    No results found for: CMV, RPR, HAAB, HCAB1, G6PD, HIVR, HIV1, HIV12, HIVPC, HIVRPI    No results found for: CPK    No results found for: COLOR, CASTS    IMPRESSION  Acute on Chronic Hypoxic Respiratory Failure, initially required 4L on admission  Abnormal Chest CT: bilateral pleural effusions, cardiomegaly, and coronary vascular calcifications concerning for decompensated heart failure  Wheezing/Suspected  COPD  Former Tobacco Use  Obesity    PLAN  Supplemental oxygen to keep sats >90%, currently on baseline 2L  Continue Bumex  as per primary team. Check TTE  Brovana /Pulmicort , scheduled Duo-nebs. Needs to go home on a maintenance inhaler such as Trelegy or Breztri.  Continue IV steroids for now  Check procalcitonin in AM.  Continue Doxycycline  for now.  Arrange nebulizer machine at discharge  Encouage IS use  OOB  Eliquis       Thank you very much for the consult.      Melissaann Dizdarevic, APRN - NP

## 2024-05-05 NOTE — Plan of Care (Signed)
 Problem: Chronic Conditions and Co-morbidities  Goal: Patient's chronic conditions and co-morbidity symptoms are monitored and maintained or improved  Outcome: Progressing     Problem: Safety - Adult  Goal: Free from fall injury  Outcome: Progressing     Problem: Pain  Goal: Verbalizes/displays adequate comfort level or baseline comfort level  Outcome: Progressing     Problem: Respiratory - Adult  Goal: Achieves optimal ventilation and oxygenation  Outcome: Progressing     Problem: Skin/Tissue Integrity  Goal: Skin integrity remains intact  Description: 1.  Monitor for areas of redness and/or skin breakdown  2.  Assess vascular access sites hourly  3.  Every 4-6 hours minimum:  Change oxygen saturation probe site  4.  Every 4-6 hours:  If on nasal continuous positive airway pressure, respiratory therapy assess nares and determine need for appliance change or resting period  Outcome: Progressing     Problem: Discharge Planning  Goal: Discharge to home or other facility with appropriate resources  Outcome: Progressing     Problem: Cardiovascular - Adult  Goal: Maintains optimal cardiac output and hemodynamic stability  Outcome: Progressing     Problem: Skin/Tissue Integrity - Adult  Goal: Skin integrity remains intact  Description: 1.  Monitor for areas of redness and/or skin breakdown  2.  Assess vascular access sites hourly  3.  Every 4-6 hours minimum:  Change oxygen saturation probe site  4.  Every 4-6 hours:  If on nasal continuous positive airway pressure, respiratory therapy assess nares and determine need for appliance change or resting period  Outcome: Progressing     Problem: Musculoskeletal - Adult  Goal: Return mobility to safest level of function  Outcome: Progressing     Problem: Physical Therapy - Adult  Goal: By Discharge: Performs mobility at highest level of function for planned discharge setting.  See evaluation for individualized goals.  Description: FUNCTIONAL STATUS PRIOR TO ADMISSION: Patient  was independent and active without use of DME.    HOME SUPPORT PRIOR TO ADMISSION: The patient lived with daughter but did not require assistance. Lives with duaghter that is high level autistic that works part time. Both live together in Extended stay motel    Physical Therapy Goals  Initiated 05/03/2024  1.  Patient will move from supine to sit and sit to supine in bed with modified independence within 7 day(s).    2.  Patient will perform sit to stand with modified independence within 7 day(s).  3.  Patient will transfer from bed to chair and chair to bed with modified independence using the least restrictive device within 7 day(s).  4.  Patient will ambulate with modified independence for 150 feet with the least restrictive device within 7 day(s).       05/05/2024 1550 by Vergil Hadassah BIRCH, PT  Outcome: Progressing     Problem: Safety - Adult  Goal: Free from fall injury  Outcome: Progressing     Problem: Respiratory - Adult  Goal: Achieves optimal ventilation and oxygenation  Outcome: Progressing     Problem: Skin/Tissue Integrity  Goal: Skin integrity remains intact  Description: 1.  Monitor for areas of redness and/or skin breakdown  2.  Assess vascular access sites hourly  3.  Every 4-6 hours minimum:  Change oxygen saturation probe site  4.  Every 4-6 hours:  If on nasal continuous positive airway pressure, respiratory therapy assess nares and determine need for appliance change or resting period  Outcome: Progressing     Problem:  Discharge Planning  Goal: Discharge to home or other facility with appropriate resources  Outcome: Progressing     Problem: Cardiovascular - Adult  Goal: Maintains optimal cardiac output and hemodynamic stability  Outcome: Progressing     Problem: Skin/Tissue Integrity - Adult  Goal: Skin integrity remains intact  Description: 1.  Monitor for areas of redness and/or skin breakdown  2.  Assess vascular access sites hourly  3.  Every 4-6 hours minimum:  Change oxygen saturation probe  site  4.  Every 4-6 hours:  If on nasal continuous positive airway pressure, respiratory therapy assess nares and determine need for appliance change or resting period  Outcome: Progressing

## 2024-05-05 NOTE — Plan of Care (Signed)
 Problem: Physical Therapy - Adult  Goal: By Discharge: Performs mobility at highest level of function for planned discharge setting.  See evaluation for individualized goals.  Description: FUNCTIONAL STATUS PRIOR TO ADMISSION: Patient was independent and active without use of DME.    HOME SUPPORT PRIOR TO ADMISSION: The patient lived with daughter but did not require assistance. Lives with duaghter that is high level autistic that works part time. Both live together in Extended stay motel    Physical Therapy Goals  Initiated 05/03/2024  1.  Patient will move from supine to sit and sit to supine in bed with modified independence within 7 day(s).    2.  Patient will perform sit to stand with modified independence within 7 day(s).  3.  Patient will transfer from bed to chair and chair to bed with modified independence using the least restrictive device within 7 day(s).  4.  Patient will ambulate with modified independence for 150 feet with the least restrictive device within 7 day(s).       Outcome: Progressing   PHYSICAL THERAPY TREATMENT    Patient: Breanna Lester (63 y.o. female)  Date: 05/05/2024  Diagnosis: COPD exacerbation (HCC) [J44.1]  New onset of congestive heart failure (HCC) [I50.9]  Acute hypoxic respiratory failure (HCC) [J96.01] Acute hypoxic respiratory failure (HCC)      Precautions:              ASSESSMENT:  Patient continues to benefit from skilled PT services and is progressing towards goals. Pt received supine in bed with purewick in place.  Pt stating that she has not been OOB all day.  Pt educated and encourage to be getting OOB for meals and walking to the bathroom with nursing vs using purewick.  Pt needing Mod A with supine to sit using poor body mechanics for back protection.  Good sitting balance on EOB.  Sit<>stand with CGA with RW.  Pt declined walking in room choosing to sit in chair.  O2 sats 96% on 2LO2 with activity.  Left in NAD in recliner.  Will try rollator next tx.          PLAN:  Patient continues to benefit from skilled intervention to address the above impairments.  Continue treatment per established plan of care.      Recommendations for mobility with staff: Recommend that staff completes patient mobility with assist x1 using gait belt and rolling walker.    Recommendations for toileting with staff:  recommended toilet device: the bathroom.       Recommendation for discharge: (in order for the patient to meet his/her long term goals):   Intermittent physical therapy up to 2-3x/week in previous living setting    Other factors to consider for discharge: per above    IF patient discharges home will need the following DME: tbd; may order rollator after trial in tx       SUBJECTIVE:   Patient stated, I will get OOB more.    OBJECTIVE DATA SUMMARY:   Critical Behavior:  Orientation  Orientation Level: Oriented X4  Cognition  Overall Cognitive Status: WNL    Functional Mobility Training:  Bed Mobility:  Bed Mobility Training  Rolling: Modified independent  Supine to Sit: Minimal assistance  Scooting: Supervision  Transfers:  Transfer Training  Overall Level of Assistance: Contact guard assistance  Interventions: Verbal cues;Safety awareness training  Sit to Stand: Contact guard assistance  Stand to Sit: Contact guard assistance  Balance:  Balance  Sitting: Intact  Standing:  With support;Impaired  Standing - Static: Constant support;Good  Standing - Dynamic: Constant support;Good;Fair   Ambulation/Gait Training:     Gait  Gait Training: Yes  Overall Level of Assistance: Contact guard assistance  Distance (ft): 5 Feet  Assistive Device: Gait belt;Walker, rolling  Interventions: Verbal cues;Tactile cues  Base of Support: Widened  Speed/Cadence: Pace decreased (< 100 feet/min);Slow  Step Length: Left shortened;Right shortened  Gait Abnormalities: Decreased step clearance            Pain Rating:  0/10   Pain Intervention(s):   nursing notified, nursing notified and addressing, and  repositioning    Activity Tolerance:   Good    After treatment:   Patient left in no apparent distress sitting up in chair, Call bell within reach, and Bed/ chair alarm activated      COMMUNICATION/EDUCATION:   The patient's plan of care was discussed with: registered nurse and case manager    Patient Education  Education Given To: Patient  Education Provided: Role of Therapy;Plan of Care;Equipment  Education Method: Verbal  Education Outcome: Verbalized understanding;Demonstrated understanding;Continued education needed      Cherry Wittwer D Lillias Difrancesco, PT  Minutes: 24

## 2024-05-06 LAB — ECHO (TTE) COMPLETE (PRN CONTRAST/BUBBLE/STRAIN/3D)
AV Area by Peak Velocity: 3.1 cm2
AV Area by VTI: 3.1 cm2
AV Mean Gradient: 7 mmHg
AV Mean Velocity: 1.2 m/s
AV Peak Gradient: 13 mmHg
AV Peak Velocity: 1.8 m/s
AV VTI: 42.8 cm
AV Velocity Ratio: 0.78
AVA/BSA Peak Velocity: 1.4 cm2/m2
AVA/BSA VTI: 1.4 cm2/m2
Ao Root Index: 1.35 cm/m2
Aortic Root: 3 cm
Ascending Aorta Index: 1.39 cm/m2
Ascending Aorta: 3.1 cm
Body Surface Area: 2.38 m2
E/E' Lateral: 17.52
E/E' Ratio (Averaged): 23.08
E/E' Septal: 28.64
EF BP: 72 % (ref 55–100)
EF Physician: 72 %
Est. RA Pressure: 3 mmHg
Fractional Shortening 2D: 36 % (ref 28–44)
IVSd: 1.1 cm — AB (ref 0.6–0.9)
LA Diameter: 4.4 cm
LA Size Index: 1.97 cm/m2
LA Volume A-L A4C: 92 mL — AB (ref 22–52)
LA Volume Index A-L A4C: 41 mL/m2 — AB (ref 16–34)
LA Volume Index MOD A4C: 40 mL/m2 — AB (ref 16–34)
LA Volume MOD A4C: 90 mL — AB (ref 22–52)
LA/AO Root Ratio: 1.47
LV E' Lateral Velocity: 6.62 cm/s
LV E' Septal Velocity: 4.05 cm/s
LV EDV A2C: 142 mL
LV EDV A4C: 130 mL
LV EDV BP: 136 mL — AB (ref 56–104)
LV EDV Index A2C: 64 mL/m2
LV EDV Index A4C: 58 mL/m2
LV EDV Index BP: 61 mL/m2
LV ESV A2C: 55 mL
LV ESV A4C: 26 mL
LV ESV BP: 38 mL (ref 19–49)
LV ESV Index A2C: 25 mL/m2
LV ESV Index A4C: 12 mL/m2
LV ESV Index BP: 17 mL/m2
LV Ejection Fraction A2C: 61 %
LV Ejection Fraction A4C: 80 %
LV Mass 2D Index: 102.1 g/m2 — AB (ref 43–95)
LV Mass 2D: 227.7 g — AB (ref 67–162)
LV RWT Ratio: 0.42
LVIDd Index: 2.38 cm/m2
LVIDd: 5.3 cm (ref 3.9–5.3)
LVIDs Index: 1.52 cm/m2
LVIDs: 3.4 cm
LVOT Area: 4.2 cm2
LVOT Diameter: 2.3 cm
LVOT Mean Gradient: 4 mmHg
LVOT Peak Gradient: 8 mmHg
LVOT Peak Velocity: 1.4 m/s
LVOT SV: 139.5 mL
LVOT Stroke Volume Index: 62.6 mL/m2
LVOT VTI: 33.6 cm
LVOT:AV VTI Index: 0.79
LVPWd: 1.1 cm — AB (ref 0.6–0.9)
MV A Velocity: 0.86 m/s
MV E Velocity: 1.16 m/s
MV E Wave Deceleration Time: 163.7 ms
MV E/A: 1.35
PV Max Velocity: 1.1 m/s
PV Mean Gradient: 3 mmHg
PV Mean Velocity: 0.8 m/s
PV Peak Gradient: 5 mmHg
Posterior dimension: 1 cm
RV Free Wall Peak S': 13.2 cm/s
RVIDd: 4.8 cm
RVOT Mean Gradient: 1 mmHg
RVOT Peak Gradient: 3 mmHg
RVOT Peak Velocity: 0.8 m/s
RVOT VTI: 17.8 cm
RVSP: 29 mmHg
TAPSE: 2.8 cm (ref 1.7–?)
TR Max Velocity: 2.53 m/s
TR Peak Gradient: 27 mmHg

## 2024-05-06 LAB — PHOSPHORUS: Phosphorus: 2.6 mg/dL (ref 2.5–4.5)

## 2024-05-06 LAB — BASIC METABOLIC PANEL
Anion Gap: 10 mmol/L (ref 2–14)
BUN/Creatinine Ratio: 34 — ABNORMAL HIGH (ref 12–20)
BUN: 37 mg/dL — ABNORMAL HIGH (ref 8–23)
CO2: 33 mmol/L — ABNORMAL HIGH (ref 20–29)
Calcium: 8.7 mg/dL — ABNORMAL LOW (ref 8.8–10.2)
Chloride: 101 mmol/L (ref 98–107)
Creatinine: 1.1 mg/dL — ABNORMAL HIGH (ref 0.60–1.00)
Est, Glom Filt Rate: 56 ml/min/1.73m2 — ABNORMAL LOW (ref >59)
Glucose: 153 mg/dL — ABNORMAL HIGH (ref 65–100)
Potassium: 4.4 mmol/L (ref 3.5–5.1)
Sodium: 144 mmol/L (ref 136–145)

## 2024-05-06 LAB — POCT GLUCOSE
POC Glucose: 281 mg/dL — ABNORMAL HIGH (ref 65–117)
POC Glucose: 141 mg/dL — ABNORMAL HIGH (ref 65–117)
POC Glucose: 190 mg/dL — ABNORMAL HIGH (ref 65–117)
POC Glucose: 179 mg/dL — ABNORMAL HIGH (ref 65–117)

## 2024-05-06 LAB — CULTURE, MRSA, SCREENING

## 2024-05-06 LAB — MAGNESIUM: Magnesium: 2.3 mg/dL (ref 1.6–2.4)

## 2024-05-06 LAB — BRAIN NATRIURETIC PEPTIDE: NT Pro-BNP: 1444 pg/mL — ABNORMAL HIGH (ref 0–125)

## 2024-05-06 MED ORDER — CYCLOBENZAPRINE HCL 10 MG PO TABS
10 | Freq: Three times a day (TID) | ORAL | Status: DC | PRN
Start: 2024-05-06 — End: 2024-05-09
  Administered 2024-05-06 – 2024-05-09 (×6): 10 mg via ORAL

## 2024-05-06 MED ORDER — TRAMADOL HCL 50 MG PO TABS
50 | Freq: Four times a day (QID) | ORAL | Status: DC | PRN
Start: 2024-05-06 — End: 2024-05-09
  Administered 2024-05-07 – 2024-05-09 (×2): 50 mg via ORAL

## 2024-05-06 MED ORDER — DOXYCYCLINE HYCLATE 100 MG PO TABS
100 | Freq: Two times a day (BID) | ORAL | Status: AC
Start: 2024-05-06 — End: 2024-05-08
  Administered 2024-05-07 – 2024-05-09 (×5): 100 mg via ORAL

## 2024-05-06 MED FILL — METHYLPREDNISOLONE SODIUM SUCC 40 MG IJ SOLR: 40 mg | INTRAMUSCULAR | Qty: 40 | Fill #0

## 2024-05-06 MED FILL — ACETAZOLAMIDE 250 MG PO TABS: 250 mg | ORAL | Qty: 1 | Fill #0

## 2024-05-06 MED FILL — BENZONATATE 100 MG PO CAPS: 100 mg | ORAL | Qty: 1 | Fill #0

## 2024-05-06 MED FILL — HYDROMORPHONE HCL 2 MG PO TABS: 2 mg | ORAL | Qty: 1 | Fill #0

## 2024-05-06 MED FILL — METOPROLOL TARTRATE 25 MG PO TABS: 25 mg | ORAL | Qty: 1 | Fill #0

## 2024-05-06 MED FILL — CYCLOBENZAPRINE HCL 10 MG PO TABS: 10 mg | ORAL | Qty: 1 | Fill #0

## 2024-05-06 MED FILL — EFFER-K 10 MEQ PO TBEF: 10 meq | ORAL | Qty: 4 | Fill #0

## 2024-05-06 MED FILL — INSULIN LISPRO 100 UNIT/ML IJ SOLN: 100 [IU]/mL | INTRAMUSCULAR | Qty: 4 | Fill #0

## 2024-05-06 MED FILL — ACETAMINOPHEN 325 MG PO TABS: 325 mg | ORAL | Qty: 2 | Fill #0

## 2024-05-06 MED FILL — GUAIFENESIN ER 600 MG PO TB12: 600 mg | ORAL | Qty: 1 | Fill #0

## 2024-05-06 MED FILL — LIDOCAINE PAIN RELIEF 4 % EX PTCH: 4 % | CUTANEOUS | Qty: 1 | Fill #0

## 2024-05-06 MED FILL — BUMETANIDE 0.25 MG/ML IJ SOLN: 0.25 mg/mL | INTRAMUSCULAR | Qty: 4 | Fill #0

## 2024-05-06 MED FILL — ELIQUIS 5 MG PO TABS: 5 mg | ORAL | Qty: 1 | Fill #0

## 2024-05-06 NOTE — Care Coordination-Inpatient (Signed)
 Care Management Initial Assessment  05/06/2024 4:52 PM  If patient is discharged prior to next notation, then this note serves as note for discharge by case management.    Reason for Admission:   COPD exacerbation (HCC) [J44.1]  New onset of congestive heart failure (HCC) [I50.9]  Acute hypoxic respiratory failure (HCC) [J96.01]         Patient Admission Status: Inpatient  Date Admitted to INP: 05/02/24  RUR: Readmission Risk Score: 24.2    Hospitalization in the last 30 days (Readmission):  Yes        Advance Care Planning:  Code Status: Full Code  Primary Healthcare Decision Maker: Legal Next of Kin Breanna Lester (dtr) [phone])   Advance Directive: TY      __________________________________________________________________________  Assessment:      05/06/24 1419   Service Assessment   Patient Orientation Alert and Oriented   Cognition Alert   History Provided By Medical Record   Primary Caregiver Self   Support Systems Children   Patient's Healthcare Decision Maker is: Legal Next of Kin  Breanna Lester (dtr) [phone])   PCP Verified by CM Yes   Last Visit to PCP Within last 3 months   Prior Functional Level Independent in ADLs/IADLs   Current Functional Level Independent in ADLs/IADLs   Can patient return to prior living arrangement Yes   Ability to make needs known: Good   Family able to assist with home care needs: Yes   Would you like for me to discuss the discharge plan with any other family members/significant others, and if so, who? No   Social/Functional History   Lives With Daughter   Type of Home   ([facility] in [place]  [address] [place], [place], [place] [address]   Room:  114)   Bathroom Chemical engineer Oxygen  (Oxygen at 2L NC through [employer]  Pt has a portable and a concentrator)   Receives Help From Family;Friend(s)   Prior Level of Assist for ADLs Independent   Prior Level of Assist for [employer]  Independent   Ambulation Assistance Independent   Prior Level of Assist for Transfers Independent   Active Driver No   Patient's Driver Info patient takes [employer]/Ly[employer]   Occupation On disability   Condition of Participation: Discharge Planning   The Plan for Transition of Care is related to the following treatment goals:  --        Unable to meet with Pt at this time. Brief assessment completed with chart review. CM to follow-up with full assessment.     Comments:     Discharge Concerns: [] Yes [] No [x] Unknown   Describe:    Financial concerns/barriers: [] Yes, explain: [] No [x] Unknown/Not discussed  __________________________________________________________________________    Insurer:   Active Insurance as of 05/02/2024       Primary Coverage       Payor Plan Insurance Group Employer/Plan Group    [place] [employer] [place] [employer] MEDIBLUE PLUS [id number]       Payor Plan Address Payor Plan Phone Number Payor Plan Fax Number Effective Dates    [name] [phone]   07/11/2023 - None Entered    [name]  [place] [address]         Subscriber Name Subscriber Birth Date Member ID       [name], [name] 1961/06/05 [id number]  PCP: Unknown, Provider, NP-C   Address: None   Phone number: None    Pharmacy:   [facility] [id number] [place], [place] - [address] [place] [phone] [place] [phone]  [id number]  [place] [address]  Phone: [phone] Fax: [phone]    DC Transport:  cab or family        Transition of care plan:    [x] Unable to determine at this time. Awaiting clinical progress, and disposition recommendations.    []  Home. No assistance required.     []  Home. Pt refused recommended services.    []  Home with family assistance as needed, and outpatient follow-up.    []  Home with Outpatient PT and outpatient follow-up   Pt aware of OP appt? [] Yes, Provider:   [] Not scheduled   Transport provider:     []  Home with outpatient services.    Specify:    []  Home with Home  Health   - Freedom of Choice offered? []  Yes, Preference:   []  NA    [] SNF/IPR   -[] Freedom of Choice offered, and preferences given:   [] Listing provided and preferences requested   -Status: [] Pending [] Accepted:    -Auth required: [] Yes [] No    -Auth initiated date:   -3 midnight stay required: [] Yes [] No  Date satisfied:     []  LTC:     []  Home with Hospice   - Freedom of Choice offered? []  Yes, Preference:   []  NA    []  Dispatch Health information provided.     []  Other:       Breanna Lester  Case Management Department  For questions or concerns, please PerfectServe

## 2024-05-06 NOTE — Consults (Signed)
 [facility]                    Cardiology Care Note     [x] Initial Encounter     [] Follow-up    Patient Name: Breanna Lester - DOB:07/11/1961 - FMW:[id number]  Primary Cardiologist: none  Consulting Cardiologist: Laquetta Commander, MD     Assessment/Plan/Discussion:Cardiology Attending:     Patient seen on the day of progress note and examined  reviewed  with Advance Practice Provider (APP, NP,PA)       A/P/ Medical Decision Making:    1.  Pericardial effusion  by description of moderate for consult to be more specific this is a very focal posterior fluid collection and while it does measure between 1 and 2 cm it is only a very small area of the heart.  This is also posterior and causes no compromise of LV cardiac function or blood pressure.    2.  COPD exacerbation on steroids and antibiotics she has wheezing and I think this is COPD exacerbation as her main source with symptoms of shortness of breath.    3.  Patient is able to lie flat without PND orthopnea.  No overt heart failure.  Elevated BNP with normal EF.  In fact EF is hyperdynamic at 70% or greater.  D  Can reduce or hold bumex  until am labs, defer for further diuretics per primary team    4.  History of cath without obstructive coronary disease post supply/demand NSTEMI type II in 2015.    5.  PAF on Eliquis  can continue has no bleeding     Discussed with Dr. Rosalea  We will see back as needed    Breanna Lester is a 63 y.o. female   Subj: Patient with history of prior elevated troponins post sepsis that she describes have until about 3 years ago followed in [place].  More recently cardiac symptoms stable.  Now presents with COPD exacerbation.  Echocardiogram done and noted moderate-sized pericardial effusion.  Patient reports chest pain intermittently for many years not particularly increased in pattern.  Shortness of breath did significantly increase and came in with COPD and has wheezing.  Patient is currently on Bumex  1 mg daily  PAF on  metoprolol  and Eliquis  details on  Possible NSTEMI [facility] [place]  December 2015 EF 55 to 60%  Cardiac cath 2015 no obstructive coronary disease  Possible other prior NSTEMI's saw cardiology 2019 at [facility]  History of alcoholic cirrhosis  Chronic chest pain,  Obj:BP 138/81   Pulse 52   Temp 97.9 F (36.6 C) (Oral)   Resp 16   Ht 1.575 m (5' 2)   Wt 129.7 kg (286 lb)   SpO2 94%   BMI 52.31 kg/m         NC AT no JVD, No jaundiceClear lungs, good air movement      Abd-non distend,Neuro -Grossly afocal, moves all ext      RRR  No  murmur, no rub or gallop        ed      No edema    L: Sodium 144 potassium 4.4 BUN 37 creatinine 1.1 mag 2.3 proBNP 1444  Lab Results   Component Value Date    CREATININE 1.10 (H) 05/06/2024      Lab Results   Component Value Date    HGB 11.2 (L) 05/05/2024      NT Pro-BNP (PG/ML)   Date Value   05/06/2024 1,444 (H)  05/05/2024 1,427 (H)   05/02/2024 3,447 (H)   04/08/2024 353 (H)   03/19/2022 217 (H)       05/02/24    ECHO (TTE) COMPLETE (PRN CONTRAST/BUBBLE/STRAIN/3D) 05/06/2024 10:52 AM (Final)    Interpretation Summary    Left Ventricle: Normal left ventricular systolic function with a visually estimated EF of 70 - 75%. Left ventricle size is normal. Normal wall thickness. Normal wall motion. Normal diastolic function.    Right Ventricle: Right ventricle is moderately dilated. Normal systolic function.    Right Atrium: Right atrium is mildly dilated.    Pericardium: Moderate (1-2 cm) localized pericardial effusion present around the left ventricle. Pericardial posterior effusion measures 1.0 cm.    Image quality is adequate. Contrast used: Lumason . Technically difficult study, technically difficult study with poor endocardial visualization, technically difficult study due to patient's body habitus and technically difficult study due to patient's heart rhythm.    Signed by: Laquetta Commander, MD on 05/06/2024 10:52 AM        Laquetta Commander, MD ,[facility] Office number [phone]        Reason for encounter: pericardial effusion     HPI:       Breanna Lester is a 63 y.o. female with PMH significant for CAD, a fib , anxiety/depression, HTN, XOL, COPD, DM and recent hospitalizations for CAP and GI bleed admitted for acute hypoxic respiratory failure. Pt states she was doing okay at the time of discharge but despite taking medications prescribed was getting more short of breath. Denies productive cough, fevers. Also endorses some diarrhea. Has noted LLE swelling.     Recently moved from [place]  No recent cardiology visits  No ischemic eval since 2010        Subjective:      Breanna Lester reports dyspnea and wheezing.     Assessment and Plan     1 A/C resp failure with abnormal chest CT, bilateral pleural effusions, COPD. No PE on CT scan  - baseline 2 liters oxygen   - on nebs, abx, steroids   - element of HF p EF   - likely OSA, has not been tested   - bumex  1 mg IV QD , repeat pBNP in am     2 small pericardial effusion by echo   - no s/s tamponade  - no RV collapse on ECHO     3 Hx PAF on eliquis  , metoprolol   - RA dilated on echo   - hold BB given braycardia    4 obesity   Body mass index is 52.31 kg/m.      5 DM    6 Hx CAD   - NSTEMI in 2010 no PCI  - no ischemic eval since       ____________________________________________________________    Cardiac testing  05/02/24    ECHO (TTE) COMPLETE (PRN CONTRAST/BUBBLE/STRAIN/3D) 05/06/2024 10:52 AM (Final)    Interpretation Summary    Left Ventricle: Normal left ventricular systolic function with a visually estimated EF of 70 - 75%. Left ventricle size is normal. Normal wall thickness. Normal wall motion. Normal diastolic function.    Right Ventricle: Right ventricle is moderately dilated. Normal systolic function.    Right Atrium: Right atrium is mildly dilated.    Pericardium: Moderate (1-2 cm) localized pericardial effusion present around the left ventricle. Pericardial posterior effusion measures 1.0 cm.    Image quality is  adequate. Contrast used: Lumason . Technically difficult study, technically difficult study with  poor endocardial visualization, technically difficult study due to patient's body habitus and technically difficult study due to patient's heart rhythm.          Most recent HS troponins:  No results for input(s): TROPONINT in the last 72 hours.   Recent Results (from the past 24 hours)   POCT Glucose    Collection Time: 05/05/24 11:31 AM   Result Value Ref Range    POC Glucose 191 (H) 65 - 117 mg/dL    Performed by: Hessick Stephanie    POCT Glucose    Collection Time: 05/05/24  4:15 PM   Result Value Ref Range    POC Glucose 249 (H) 65 - 117 mg/dL    Performed by: Myra Railing    Echo (TTE) complete (PRN contrast/bubble/strain/3D)    Collection Time: 05/05/24  6:25 PM   Result Value Ref Range    Body Surface Area 2.38 m2    IVSd 1.1 (A) 0.6 - 0.9 cm    LVIDd 5.3 3.9 - 5.3 cm    LVIDs 3.4 cm    LVOT Diameter 2.3 cm    LVPWd 1.1 (A) 0.6 - 0.9 cm    EF BP 72 55 - 100 %    LV Ejection Fraction A2C 61 %    LV Ejection Fraction A4C 80 %    LV EDV A2C 142 mL    LV EDV A4C 130 mL    LV EDV BP 136 (A) 56 - 104 mL    LV ESV A2C 55 mL    LV ESV A4C 26 mL    LV ESV BP 38 19 - 49 mL    LVOT Peak Gradient 8 mmHg    LVOT Mean Gradient 4 mmHg    LVOT SV 139.5 ml    LVOT Peak Velocity 1.4 m/s    LVOT VTI 33.6 cm    RVIDd 4.8 cm    RVSP 29 mmHg    RV Free Wall Peak S' 13.2 cm/s    RVOT Peak Gradient 3 mmHg    RVOT Mean Gradient 1 mmHg    RVOT Peak Velocity 0.8 m/s    RVOT VTI 17.8 cm    LA Diameter 4.4 cm    LA Volume A-L A4C 92 (A) 22 - 52 mL    LA Volume MOD A4C 90 (A) 22 - 52 mL    Est. RA Pressure 3 mmHg    AV Area by Peak Velocity 3.1 cm2    AV Area by VTI 3.1 cm2    AV Peak Gradient 13 mmHg    AV Mean Gradient 7 mmHg    AV Peak Velocity 1.8 m/s    AV Mean Velocity 1.2 m/s    AV VTI 42.8 cm    MV A Velocity 0.86 m/s    MV E Wave Deceleration Time 163.7 ms    MV E Velocity 1.16 m/s    LV E' Lateral Velocity 6.62 cm/s    LV E'  Septal Velocity 4.05 cm/s    PV Peak Gradient 5 mmHg    PV Mean Gradient 3 mmHg    PV Max Velocity 1.1 m/s    PV Mean Velocity 0.8 m/s    TAPSE 2.8 >=1.7 cm    TR Peak Gradient 27 mmHg    TR Max Velocity 2.53 m/s    Ascending Aorta 3.1 cm    Aortic Root 3.0 cm    Fractional Shortening 2D 36 28 - 44 %  LV ESV Index BP 17 mL/m2    LV EDV Index BP 61 mL/m2    LV ESV Index A4C 12 mL/m2    LV EDV Index A4C 58 mL/m2    LV ESV Index A2C 25 mL/m2    LV EDV Index A2C 64 mL/m2    LVIDd Index 2.38 cm/m2    LVIDs Index 1.52 cm/m2    LV RWT Ratio 0.42     LV Mass 2D 227.7 (A) 67 - 162 g    LV Mass 2D Index 102.1 (A) 43 - 95 g/m2    MV E/A 1.35     E/E' Ratio (Averaged) 23.08     E/E' Lateral 17.52     E/E' Septal 28.64     LVOT Stroke Volume Index 62.6 mL/m2    LVOT Area 4.2 cm2    LA Volume Index A-L A4C 41 (A) 16 - 34 mL/m2    LA Volume Index MOD A4C 40 (A) 16 - 34 ml/m2    LA Size Index 1.97 cm/m2    LA/AO Root Ratio 1.47     Ao Root Index 1.35 cm/m2    Ascending Aorta Index 1.39 cm/m2    AV Velocity Ratio 0.78     LVOT:AV VTI Index 0.79     AVA/BSA VTI 1.4 cm2/m2    AVA/BSA Peak Velocity 1.4 cm2/m2    Posterior dimension 1.0 cm    EF Physician 72 %   POCT Glucose    Collection Time: 05/05/24  9:21 PM   Result Value Ref Range    POC Glucose 281 (H) 65 - 117 mg/dL    Performed by: Soloe Catie    Basic Metabolic Panel    Collection Time: 05/06/24  3:19 AM   Result Value Ref Range    Sodium 144 136 - 145 mmol/L    Potassium 4.4 3.5 - 5.1 mmol/L    Chloride 101 98 - 107 mmol/L    CO2 33 (H) 20 - 29 mmol/L    Anion Gap 10 2 - 14 mmol/L    Glucose 153 (H) 65 - 100 mg/dL    BUN 37 (H) 8 - 23 MG/DL    Creatinine 8.89 (H) 0.60 - 1.00 MG/DL    BUN/Creatinine Ratio 34 (H) 12 - 20      Est, Glom Filt Rate 56 (L) >59 ml/min/1.29m2    Calcium 8.7 (L) 8.8 - 10.2 MG/DL   Magnesium     Collection Time: 05/06/24  3:19 AM   Result Value Ref Range    Magnesium  2.3 1.6 - 2.4 mg/dL   Phosphorus    Collection Time: 05/06/24  3:19 AM   Result Value  Ref Range    Phosphorus 2.6 2.5 - 4.5 MG/DL   Brain Natriuretic Peptide    Collection Time: 05/06/24  3:19 AM   Result Value Ref Range    NT Pro-BNP 1,444 (H) 0 - 125 PG/ML   POCT Glucose    Collection Time: 05/06/24  8:06 AM   Result Value Ref Range    POC Glucose 141 (H) 65 - 117 mg/dL    Performed by: Kassie Lester      ECG:   Encounter Date: 05/02/24   EKG 12 Lead   Result Value    Ventricular Rate 67    Atrial Rate 67    P-R Interval 156    QRS Duration 86    Q-T Interval 470    QTc Calculation (Bazett) 496    P  Axis 66    R Axis 78    T Axis 227    Diagnosis      Normal sinus rhythm  Cannot rule out Anterior infarct , age undetermined  When compared with ECG of 14-Apr-2024 13:18,  aberrant conduction is no longer present  Nonspecific T wave abnormality now evident in Inferior leads  Nonspecific T wave abnormality now evident in Lateral leads  Confirmed by Elenor Aquas [phone]) on 05/03/2024 11:21:16 AM           Review of Systems:    [x] All other systems reviewed and all negative except as written in HPI    []  Patient unable to provide secondary to condition    Past Medical History:   Diagnosis Date    Anxiety     Depression     Gastrointestinal disorder     Heart failure (HCC)     Hyperlipidemia     Hypertension     Liver disease 2009    liver failure    Pneumonia      Past Surgical History:   Procedure Laterality Date    ADENOIDECTOMY  1972    BACK SURGERY      cement discs following a fall    CATARACT REMOVAL      CESAREAN SECTION  1986 and 1993    DILATION AND CURETTAGE OF UTERUS  1999    EGD TRANSORAL BIOPSY SINGLE/MULTIPLE  05/17/2009         HYSTERECTOMY (CERVIX STATUS UNKNOWN)  2008    TONSILLECTOMY  1972    UPPER GASTROINTESTINAL ENDOSCOPY N/A 04/17/2024    ESOPHAGOGASTRODUODENOSCOPY performed by Madlyn Fendt, MD at [facility] ENDOSCOPY     Social Hx:  reports that she quit smoking about 16 years ago. Her smoking use included cigarettes. She has never used smokeless tobacco. She reports that she does not drink  alcohol and does not use drugs.  Family Hx: family history includes Cancer in her mother; Heart Disease in her father.  Allergies   Allergen Reactions    Latex Rash          OBJECTIVE:  Wt Readings from Last 3 Encounters:   05/03/24 129.7 kg (286 lb)   05/05/24 129.7 kg (286 lb)   04/25/24 125 kg (275 lb 9.6 oz)     I/O last 3 completed shifts:  In: 940 [P.O.:940]  Out: 2125 [Urine:2125]  I/O this shift:  In: 340 [P.O.:240; IV Piggyback:100]  Out: 1200 [Urine:1200]        Physical Exam:    Vitals:   Vitals:    05/06/24 0804 05/06/24 0830 05/06/24 0835 05/06/24 0840   BP: 138/81      Pulse: 59 55 53 56   Resp: 16 16 16 16    Temp: 97.9 F (36.6 C)      TempSrc: Oral      SpO2: 91% 94% 94% 94%   Weight:       Height:         Telemetry: normal sinus rhythm/ sinus bradycardia     Gen: Well-developed, well-nourished, in no acute distress  Neck: Supple, No JVD, No Carotid Bruit  Resp: No accessory muscle use, exp wheezing throughout  Card: Regular Rate, Rythm, Normal S1, S2, No murmurs, rubs or gallop. No thrills.   Abd:   obese, Soft, non-tender, non-distended, BS+   MSK: No cyanosis  Skin: No rashes    Neuro: Moving all four extremities, follows commands appropriately  Psych: Good insight, oriented to person,  place, alert, Nml Affect  LE: No edema    Data Review:     Radiology:   XR Results (most recent):Xray Result (most recent):  XR CHEST PORTABLE 05/02/2024    Narrative  PORTABLE CHEST RADIOGRAPH/S: 05/02/2024 2:48 PM    INDICATION: Dyspnea.    COMPARISON: 04/08/2024, 12/07/2021.    TECHNIQUE: Portable frontal upright radiograph/s of the chest.    FINDINGS:  The radiographs are of limited diagnostic utility due to patient body habitus  and portable technique. The heart is enlarged. There is probably pulmonary  vascular congestion.    Impression  Limited due to patient body habitus and portable technique. PA and lateral chest  radiographs are recommended for better evaluation.    Electronically signed by Medford Manners       CT Result (most recent):  CTA CHEST W WO CONTRAST 05/02/2024    Narrative  Clinical history: sob r/o PE  INDICATION:   sob r/o PE  COMPARISON: 04/15/2024      CONTRAST:  Isovue  370  TECHNIQUE: CTA of the chest with  IV contrast , Isovue -370 is performed. Axial  images from the thoracic inlet to the level of the upper abdomen are obtained.  Manual post-processing of the images and coronal reformatting is also performed.  CT dose reduction was achieved through use of a standardized protocol tailored  for this examination and automatic exposure control for dose modulation.  Multiplanar reformatted imaging was performed.  Sagittal and coronal reformatting.  3-D Postprocessing of imaging was performed.  3-D MIP reconstructed images were obtained in the coronal plane.    FINDINGS:  PE: This is a good quality study for the evaluation of pulmonary embolism to the  subsegmental arterial level. There is no pulmonary embolism to this level.  CORONARY VASCULAR CALCIFICATIONS:  Present  There is no aortic aneurysm.    Enlarged main pulmonary artery suggest pulmonary hypertension. Cardiomegaly.  Moderate right and trace left-sided pleural effusion bibasilar atelectasis.  Scattered nodular opacities are nonspecific. There is no mediastinal, axillary  or hilar lymphadenopathy. The proximal pulmonary arteries are without evidence  of filling defects. No acute fracture is identified. The remainder of the upper  abdomen visualized is unremarkable.    Impression  There is no pulmonary embolism.  There is no aortic aneurysm.  Moderate right and small left-sided pleural effusion with bibasal atelectasis.    Cardiomegaly coronary vascular calcifications and pulmonary arterial  hypertension.  Scattered pulmonary nodules/atelectatic foci. Follow-up chest CT in 6 weeks to  evaluate for resolution recommended.  Incidental findings are as described above.      Electronically signed by JIMMY RABON      Lab Results   Component  Value Date    WBC 8.3 05/05/2024    HGB 11.2 (L) 05/05/2024    HCT 36.7 05/05/2024    MCV 87.0 05/05/2024    PLT 136 (L) 05/05/2024       No results for input(s): CHOL, HDLC, LDLC, HBA1C in the last 72 hours.    Invalid input(s): TGL    Lab Results   Component Value Date/Time    NA 144 05/06/2024 03:19 AM    K 4.4 05/06/2024 03:19 AM    CL 101 05/06/2024 03:19 AM    CO2 33 05/06/2024 03:19 AM    BUN 37 05/06/2024 03:19 AM    CREATININE 1.10 05/06/2024 03:19 AM    GLUCOSE 153 05/06/2024 03:19 AM    CALCIUM 8.7 05/06/2024 03:19 AM    LABGLOM 56  05/06/2024 03:19 AM    LABGLOM >60 03/19/2022 11:25 AM    LABGLOM >60 12/07/2021 05:23 PM      No results found for: BNP        Current meds:    Current Facility-Administered Medications:     traMADol  (ULTRAM ) tablet 50 mg, 50 mg, Oral, Q6H PRN, Do, Khoi B, MD    cyclobenzaprine  (FLEXERIL ) tablet 10 mg, 10 mg, Oral, TID PRN, Do, Khoi B, MD    bumetanide  (BUMEX ) injection 1 mg, 1 mg, IntraVENous, Daily, Do, Khoi B, MD, 1 mg at 05/06/24 0828    ipratropium 0.5 mg-albuterol  2.5 mg (DUONEB ) nebulizer solution 1 Dose, 1 Dose, Inhalation, Q6H WA RT, Do, Khoi B, MD, 1 Dose at 05/06/24 0835    methylPREDNISolone  sodium succ (SOLU-MEDROL ) 40 mg in sterile water  1 mL injection, 40 mg, IntraVENous, Q8H, Do, Khoi B, MD, 40 mg at 05/06/24 9385    guaiFENesin  (MUCINEX ) extended release tablet 600 mg, 600 mg, Oral, BID, Do, Khoi B, MD, 600 mg at 05/06/24 0827    lidocaine  4 % external patch 1 patch, 1 patch, TransDERmal, Daily PRN, Jerome Cozette RAMAN, MD, 1 patch at 05/05/24 2142    ipratropium 0.5 mg-albuterol  2.5 mg (DUONEB ) nebulizer solution 1 Dose, 1 Dose, Inhalation, Q20 Min PRN, Hutchinson, Stephanie, DO, 1 Dose at 05/03/24 0640    budesonide  (PULMICORT ) nebulizer suspension 500 mcg, 0.5 mg, Nebulization, BID RT, Dobransky, Consepcion SAILOR, MD, 500 mcg at 05/06/24 0830    arformoterol  tartrate (BROVANA ) nebulizer solution 15 mcg, 15 mcg, Nebulization, BID RT, Dobransky, Consepcion SAILOR,  MD, 15 mcg at 05/06/24 0830    doxycycline  (VIBRAMYCIN ) 100 mg in sodium chloride  0.9 % 100 mL IVPB (addEASE), 100 mg, IntraVENous, Q12H, Dobransky, Consepcion SAILOR, MD, Stopped at 05/06/24 9286    benzonatate  (TESSALON ) capsule 100 mg, 100 mg, Oral, TID PRN, Rondi Consepcion SAILOR, MD, 100 mg at 05/05/24 2129    sodium chloride  flush 0.9 % injection 5-40 mL, 5-40 mL, IntraVENous, 2 times per day, Dobransky, Consepcion SAILOR, MD, 10 mL at 05/05/24 2142    sodium chloride  flush 0.9 % injection 5-40 mL, 5-40 mL, IntraVENous, PRN, Dobransky, Consepcion SAILOR, MD    0.9 % sodium chloride  infusion, , IntraVENous, PRN, Dobransky, Consepcion SAILOR, MD    ondansetron  (ZOFRAN -ODT) disintegrating tablet 4 mg, 4 mg, Oral, Q8H PRN **OR** ondansetron  (ZOFRAN ) injection 4 mg, 4 mg, IntraVENous, Q6H PRN, Dobransky, Consepcion SAILOR, MD    polyethylene glycol (GLYCOLAX ) packet 17 g, 17 g, Oral, Daily PRN, Dobransky, Consepcion SAILOR, MD, 17 g at 05/05/24 1439    acetaminophen  (TYLENOL ) tablet 650 mg, 650 mg, Oral, Q6H PRN, 650 mg at 05/05/24 2142 **OR** acetaminophen  (TYLENOL ) suppository 650 mg, 650 mg, Rectal, Q6H PRN, Dobransky, Consepcion SAILOR, MD    albuterol  (PROVENTIL ) (2.5 MG/3ML) 0.083% nebulizer solution 1.25 mg, 1.25 mg, Nebulization, Q2H PRN, Dobransky, Consepcion SAILOR, MD    pantoprazole  (PROTONIX ) tablet 40 mg, 40 mg, Oral, BID AC, Dobransky, Larissa N, MD, 40 mg at 05/06/24 9391    sertraline  (ZOLOFT ) tablet 100 mg, 100 mg, Oral, Daily, Dobransky, Consepcion SAILOR, MD, 100 mg at 05/06/24 0827    escitalopram  (LEXAPRO ) tablet 5 mg, 5 mg, Oral, Daily, Dobransky, Consepcion SAILOR, MD, 5 mg at 05/06/24 9171    HYDROmorphone  (DILAUDID ) tablet 1 mg, 1 mg, Oral, Q4H PRN, Dobransky, Consepcion SAILOR, MD, 1 mg at 05/05/24 1812    HYDROmorphone  (DILAUDID ) tablet 2 mg, 2 mg, Oral, Q4H PRN, Dobransky, Consepcion SAILOR, MD, 2 mg  at 05/05/24 2141    lactobacillus (CULTURELLE) capsule 1 capsule, 1 capsule, Oral, Daily with breakfast, Dobransky, Consepcion SAILOR, MD, 1 capsule at 05/06/24 0828    glucose chewable tablet  16 g, 4 tablet, Oral, PRN, Dobransky, Consepcion SAILOR, MD    dextrose  bolus 10% 125 mL, 125 mL, IntraVENous, PRN **OR** dextrose  bolus 10% 250 mL, 250 mL, IntraVENous, PRN, Dobransky, Consepcion SAILOR, MD    glucagon  injection 1 mg, 1 mg, SubCUTAneous, PRN, Dobransky, Consepcion SAILOR, MD    dextrose  10 % infusion, , IntraVENous, Continuous PRN, Dobransky, Consepcion SAILOR, MD    insulin  lispro (HUMALOG ,ADMELOG ) injection vial 0-8 Units, 0-8 Units, SubCUTAneous, 4x Daily AC & HS, Dobransky, Consepcion SAILOR, MD, 4 Units at 05/05/24 2135    apixaban  (ELIQUIS ) tablet 5 mg, 5 mg, Oral, BID, Dobransky, Consepcion SAILOR, MD, 5 mg at 05/06/24 0827    metoprolol  tartrate (LOPRESSOR ) tablet 25 mg, 25 mg, Oral, BID, Dobransky, Consepcion SAILOR, MD, 25 mg at 05/06/24 9171    Corean Beat, APRN - NP    [facility] Card[facility]  Call center: (P) [phone]  (F) [phone]      CC:Unknown, Provider, NP-C

## 2024-05-06 NOTE — Progress Notes (Signed)
 Ultrasound IV by Irving Shows RN:  Procedure Note    Ultrasound IV education provided to patient. Opportunities for questions given.     Ultrasound used for PIV placement:  20 gauge 8 cm PowerGlide Pro 8cm  left cephalic} location.  1 X Attempt(s).    Vigorous blood return present and saline flushes with ease.     Procedure tolerated well. Primary RN aware of IV placement and added to LDA.      Kennis Carina, RN

## 2024-05-06 NOTE — Progress Notes (Signed)
 Pharmacy Dosing Services: 05/06/2024    The pharmacist has determined that this patient meets P & T approved criteria for conversion from IV to oral therapy for the following medication:Doxycycline       The pharmacist has written the following order for the patient: Doxy PO 100 BID    The pharmacist will continue to monitor the patient's status and advise the physician if conversion back to IV therapy is recommended.    Thanks,   Penne DELENA Birmingham, PharmD

## 2024-05-06 NOTE — Progress Notes (Signed)
 Physician Progress Note      PATIENT:               Breanna Lester, Breanna Lester  CSN #:                  [id number]  DOB:                       [birth year]  ADMIT DATE:       05/02/2024 2:04 PM  DISCH DATE:  RESPONDING  PROVIDER #:        Rodrick KATHEE Has MD          QUERY TEXT:    Based on your medical judgment, please clarify these findings and document if   any of the following are being evaluated and/or treated:    The clinical indicators include:  -PAF (paroxysmal atrial fibrillation) (HCC) on metoprolol  and eliquis  (per GI   ok to resume during last hospitalization)(H&P on 9/12 by Rondi Consepcion SAILOR,   MD)    -Type 2 diabetes hyperglycemia Recent A1c 8.2Hypertension  Continue   metoprolol (IM PN on 9/13 by Jerome Cozette RAMAN, MD)  - HTN/Pafib: cont metoprolol , eliquis (IM PN on 9/15 by Eliyas Suddreth, Rodrick KATHEE, MD)    -In Epic  -T Eliquis  5mg (medication list)    Thank you,  [facility]  Options provided:  -- Secondary hypercoagulable state in a patient with atrial fibrillation  -- Other - I will add my own diagnosis  -- Disagree - Not applicable / Not valid  -- Refer to Clinical Documentation Reviewer    PROVIDER RESPONSE TEXT:    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    Query created by: Hansel Barr on 05/06/2024 12:22 AM      Electronically signed by:  Rodrick KATHEE Has MD 05/06/2024 9:05 AM

## 2024-05-06 NOTE — Plan of Care (Cosign Needed)
 Problem: Occupational Therapy - Adult  Goal: By Discharge: Performs self-care activities at highest level of function for planned discharge setting.  See evaluation for individualized goals.  Description: FUNCTIONAL STATUS PRIOR TO ADMISSION: The patient was independent for ADLs and functional mobility apart from min A for tub transfers and occasional assistance for LB dressing.  HOME SUPPORT: The patient lives with her daughter who works part time outside of the home. They are living in extended stay hotel.    Occupational Therapy Goals:  Initiated 05/04/2024  1.  Patient will perform grooming with Modified Independence in sitting within 7 day(s).  2.  Patient will perform upper body dressing with Supervision within 7 day(s).  3.  Patient will perform toilet transfers with Supervision  within 7 day(s).  4.  Patient will perform all aspects of toileting with Supervision within 7 day(s).  5.  Patient will participate in upper extremity therapeutic exercise/activities with Supervision for 5 minutes within 7 day(s).    6.  Patient will utilize energy conservation techniques during functional activities with verbal cues within 7 day(s).   Outcome: Progressing   OCCUPATIONAL THERAPY TREATMENT  Patient: Breanna Lester (63 y.o. female)  Date: 05/06/2024  Primary Diagnosis: COPD exacerbation (HCC) [J44.1]  New onset of congestive heart failure (HCC) [I50.9]  Acute hypoxic respiratory failure (HCC) [J96.01]       Precautions:                  Chart, occupational therapy assessment, plan of care, and goals were reviewed.    ASSESSMENT  Patient continues to benefit from skilled OT services and is progressing towards goals. Pt O2 sats on 2 L 97% however HR fluctuated in the high 40's to lower 50's. Pt sat edge of bed, pure wick and bed wet with urine. She ambulated to restroom and transfers to commode contact guard, she manages her own bladder hygiene. Pt stood at sink to wash her hands and face before returning to chair.               PLAN :  Patient continues to benefit from skilled intervention to address the above impairments.  Continue treatment per established plan of care to address goals.    Recommendations for mobility with staff: Recommend that staff completes patient mobility with assist x1 using gait belt and rolling walker.    Recommendations for toileting with staff:  recommended toilet device: the bathroom.       Recommend next OT session: cont towards goals    Recommendation for discharge: (in order for the patient to meet his/her long term goals):   Intermittent occupational therapy up to 2-3x/week in previous living setting    Other factors to consider for discharge: no additional factors    IF patient discharges home will need the following DME:        SUBJECTIVE:   Patient stated "thank you."    OBJECTIVE DATA SUMMARY:   Cognitive/Behavioral Status:  Orientation  Orientation Level: Oriented X4  Cognition  Overall Cognitive Status: WNL    Functional Mobility and Transfers for ADLs:  Bed Mobility:  Bed Mobility Training  Rolling: Modified independent  Supine to Sit: Contact guard assistance     Transfers:           Contact guard for ACTIVITIES OF DAILY LIVING related transfers     Balance:   Intact sitting balance         ADL Intervention:  Feeding: Independent         Toileting: Minimal assistance  Toileting Skilled Clinical Factors: frequency of urination         Activity Tolerance:   Fair   Please refer to the flowsheet for vital signs taken during this treatment.    After treatment:   Patient left in no apparent distress sitting up in chair and Call bell within reach    COMMUNICATION/EDUCATION:   The patient's plan of care was discussed with: physical therapy assistant and occupational therapist    Patient Education  Education Given To: Patient  Education Provided: Role of Therapy;Plan of Care  Education Method: Verbal  Barriers to Learning: None  Education Outcome: Verbalized understanding    Thank you  for this referral.  Asberry A. Shalunda Lindh, COTA/L  Minutes: 23

## 2024-05-06 NOTE — Progress Notes (Signed)
 Name: Breanna Lester: [facility]   DOB: [birth year] Admit Date: 05/02/2024   Phone: [phone]  Room: 511/01   PCP: Unknown, Provider, NP-C  MRN: [id number]   Date: 05/06/2024  Code: Full Code        HPI:      Chart and notes reviewed. Data reviewed. I review the patient's current medications in the medical record at each encounter.  I have evaluated and examined the patient.     11:53 AM       History was obtained from patient.     I was asked by Consepcion LOISE Coombs, MD to see Breanna Lester in consultation for a chief complaint of COPD exacerbation, pleural effusion.    History of Present Illness:  Breanna Lester is a very pleasant, 64 yo lady with a PMH of anxiety/depression, hypertension, hyperlipidemia, COPD (no PFTs to review), obesity, DM, a-fib that presented to [facility] with worsening shortness of breath.  She was recently hospitalized from 9/3-95 with acute GI bleed and 8/28-8/28 with sepsis thought to be due to pneumonia. She was treated while hospitalized with CTX and discharged home on CTX/Azithromycin .   She reports that she continued to feel short of breath and has been using a rescue inhaler at least 6-7 times per day. She does not have a nebulizer at home. She was sent home with oxygen after her recent discharge and is wearing 2L.  She does not use a maintenance inhaler at home. She has never been tested for OSA.   She smoked 1ppd for about 15 years and quit 15 years ago.  Her mother had COPD.  She saw a pulmonologist once in [place], but never had PFTs.    Images:    Chest CT 8/26: new patchy right upper lobe asd    Chest CTA 9/14: no PE.  No aortic aneurysm. Moderate right and small left sided pleural effusion with bibasilar atx    WBC 8.3  Pro-BNP 1427--3,447 on admission     Interval History:  Afebrile  HR 40s while I was in room  BP stable  Sats 94% on 2L NC  TTE: EF 70-75%, moderate pericardial effusion 1-2 cm, posterior pericardial effusion measuring 1.0cm, RV moderately  dilated, normal systolic function, RA mildly dilated      ROS:  Reports that she still feels SOB. Still with some wheezing.   No cough/sputum production.  Past Medical History:   Diagnosis Date    Anxiety     Depression     Gastrointestinal disorder     Heart failure (HCC)     Hyperlipidemia     Hypertension     Liver disease 2009    liver failure    Pneumonia        Past Surgical History:   Procedure Laterality Date    ADENOIDECTOMY  1972    BACK SURGERY      cement discs following a fall    CATARACT REMOVAL      CESAREAN SECTION  1986 and 1993    DILATION AND CURETTAGE OF UTERUS  1999    EGD TRANSORAL BIOPSY SINGLE/MULTIPLE  05/17/2009         HYSTERECTOMY (CERVIX STATUS UNKNOWN)  2008    TONSILLECTOMY  1972    UPPER GASTROINTESTINAL ENDOSCOPY N/A 04/17/2024    ESOPHAGOGASTRODUODENOSCOPY performed by Madlyn Fendt, MD at [facility] ENDOSCOPY       Family History   Problem Relation Age of Onset    Cancer Mother  Heart Disease Father        Social History     Tobacco Use    Smoking status: Former     Current packs/day: 0.00     Types: Cigarettes     Quit date: 02/28/2008     Years since quitting: 16.1    Smokeless tobacco: Never   Substance Use Topics    Alcohol use: Never       Allergies   Allergen Reactions    Latex Rash       Current Facility-Administered Medications   Medication Dose Route Frequency    traMADol  (ULTRAM ) tablet 50 mg  50 mg Oral Q6H PRN    cyclobenzaprine  (FLEXERIL ) tablet 10 mg  10 mg Oral TID PRN    bumetanide  (BUMEX ) injection 1 mg  1 mg IntraVENous Daily    ipratropium 0.5 mg-albuterol  2.5 mg (DUONEB ) nebulizer solution 1 Dose  1 Dose Inhalation Q6H WA RT    methylPREDNISolone  sodium succ (SOLU-MEDROL ) 40 mg in sterile water  1 mL injection  40 mg IntraVENous Q8H    guaiFENesin  (MUCINEX ) extended release tablet 600 mg  600 mg Oral BID    lidocaine  4 % external patch 1 patch  1 patch TransDERmal Daily PRN    ipratropium 0.5 mg-albuterol  2.5 mg (DUONEB ) nebulizer solution 1 Dose  1 Dose Inhalation Q20  Min PRN    budesonide  (PULMICORT ) nebulizer suspension 500 mcg  0.5 mg Nebulization BID RT    arformoterol  tartrate (BROVANA ) nebulizer solution 15 mcg  15 mcg Nebulization BID RT    doxycycline  (VIBRAMYCIN ) 100 mg in sodium chloride  0.9 % 100 mL IVPB (addEASE)  100 mg IntraVENous Q12H    benzonatate  (TESSALON ) capsule 100 mg  100 mg Oral TID PRN    sodium chloride  flush 0.9 % injection 5-40 mL  5-40 mL IntraVENous 2 times per day    sodium chloride  flush 0.9 % injection 5-40 mL  5-40 mL IntraVENous PRN    0.9 % sodium chloride  infusion   IntraVENous PRN    ondansetron  (ZOFRAN -ODT) disintegrating tablet 4 mg  4 mg Oral Q8H PRN    Or    ondansetron  (ZOFRAN ) injection 4 mg  4 mg IntraVENous Q6H PRN    polyethylene glycol (GLYCOLAX ) packet 17 g  17 g Oral Daily PRN    acetaminophen  (TYLENOL ) tablet 650 mg  650 mg Oral Q6H PRN    Or    acetaminophen  (TYLENOL ) suppository 650 mg  650 mg Rectal Q6H PRN    albuterol  (PROVENTIL ) (2.5 MG/3ML) 0.083% nebulizer solution 1.25 mg  1.25 mg Nebulization Q2H PRN    pantoprazole  (PROTONIX ) tablet 40 mg  40 mg Oral BID AC    sertraline  (ZOLOFT ) tablet 100 mg  100 mg Oral Daily    escitalopram  (LEXAPRO ) tablet 5 mg  5 mg Oral Daily    HYDROmorphone  (DILAUDID ) tablet 1 mg  1 mg Oral Q4H PRN    HYDROmorphone  (DILAUDID ) tablet 2 mg  2 mg Oral Q4H PRN    lactobacillus (CULTURELLE) capsule 1 capsule  1 capsule Oral Daily with breakfast    glucose chewable tablet 16 g  4 tablet Oral PRN    dextrose  bolus 10% 125 mL  125 mL IntraVENous PRN    Or    dextrose  bolus 10% 250 mL  250 mL IntraVENous PRN    glucagon  injection 1 mg  1 mg SubCUTAneous PRN    dextrose  10 % infusion   IntraVENous Continuous PRN    insulin  lispro (HUMALOG ,ADMELOG )  injection vial 0-8 Units  0-8 Units SubCUTAneous 4x Daily AC & HS    apixaban  (ELIQUIS ) tablet 5 mg  5 mg Oral BID    metoprolol  tartrate (LOPRESSOR ) tablet 25 mg  25 mg Oral BID         REVIEW OF SYSTEMS   Negative except as stated in the HPI.      Physical  Exam:   Vitals:    05/06/24 1100   BP:    Pulse: (!) 46   Resp:    Temp:    SpO2:        General:  Alert, cooperative, no distress, appears stated age.   Head:  Normocephalic, without obvious abnormality, atraumatic.   Eyes:  Conjunctivae/corneas clear.    Nose: Nares normal. Septum midline. Mucosa normal.    Throat: Lips, mucosa, and tongue normal. Teeth and gums normal.   Neck: Supple, symmetrical, trachea midline, no adenopathy   Back:   Symmetric, no curvature. ROM normal.   Lungs:   Wheezing bilaterally   Chest wall:  No tenderness or deformity.   Heart:  Regular rate and rhythm, S1, S2 normal, no murmur, click, rub or gallop.   Abdomen:   Soft, non-tender. Bowel sounds normal.    Extremities: Extremities normal, atraumatic, no cyanosis or edema.   Pulses: 2+ and symmetric all extremities.   Skin: Skin color, texture, turgor normal   Neurologic: Grossly nonfocal       Lab Results   Component Value Date/Time    NA 144 05/06/2024 03:19 AM    K 4.4 05/06/2024 03:19 AM    CL 101 05/06/2024 03:19 AM    CO2 33 05/06/2024 03:19 AM    BUN 37 05/06/2024 03:19 AM    MG 2.3 05/06/2024 03:19 AM    PHOS 2.6 05/06/2024 03:19 AM       Lab Results   Component Value Date/Time    WBC 8.3 05/05/2024 06:06 AM    HGB 11.2 05/05/2024 06:06 AM    PLT 136 05/05/2024 06:06 AM    MCV 87.0 05/05/2024 06:06 AM       Lab Results   Component Value Date/Time    INR 1.3 05/03/2024 11:21 AM    GLOB 3.0 05/02/2024 02:44 PM       Lab Results   Component Value Date/Time    IRON 40 04/24/2024 04:59 AM    TIBC 292 04/24/2024 04:59 AM       Lab Results   Component Value Date/Time    CRP 1.1 05/05/2024 06:06 AM    ANA Negative 04/15/2024 03:15 PM    TSH 3.57 03/19/2022 12:25 PM        @BRIEFLAB24 (ph,phi,pco2,pco2i,po2,po2i,hco3,hco3i,fio2,fio2i)@    No results found for: CPK, BNP     No components found for: CULT    No results found for: CMV, RPR, HAAB, HCAB1, G6PD, HIVR, HIV1, HIV12, HIVPC, HIVRPI    No results found for:  CPK    No results found for: COLOR, CASTS    IMPRESSION  Acute on Chronic Hypoxic Respiratory Failure, initially required 4L on admission  Abnormal Chest CT: bilateral pleural effusions, cardiomegaly, and coronary vascular calcifications concerning for decompensated heart failure  Pericardial Effusion  Wheezing/Suspected COPD  Former Tobacco Use  Obesity    PLAN  Supplemental oxygen to keep sats >90%, currently on baseline 2L  Continue Bumex  as per primary team. Check TTE  Brovana /Pulmicort , scheduled Duo-nebs. Needs to go home on a maintenance inhaler such as Trelegy or Breztri.  Continue IV steroids for now, will keep at current dose  Consult cardiology  Check procalcitonin- pending.  Continue Doxycycline  for now.  Arrange nebulizer machine at discharge  Encouage IS use  OOB  Eliquis         Margarie Minor, APRN - NP

## 2024-05-06 NOTE — Progress Notes (Signed)
 [facility]  [address] [place], [place] [address]  [phone]        Hospitalist Progress Note      NAME: Breanna Lester   DOB:  05-Jan-1961  MRM:  [id number]    Date/Time of service: 05/06/2024  11:37 AM       Subjective:     Chief Complaint:  Patient was personally seen and examined by me during this time period.  Chart reviewed.  Still with fatigue, SOB, wheezing       Objective:       Vitals:       Last 24hrs VS reviewed since prior progress note. Most recent are:    Vitals:    05/06/24 1100   BP:    Pulse: (!) 46   Resp:    Temp:    SpO2:      SpO2 Readings from Last 6 Encounters:   05/06/24 94%   04/25/24 96%   04/17/24 97%   04/11/24 95%   02/26/24 94%   02/18/24 92%          Intake/Output Summary (Last 24 hours) at 05/06/2024 1137  Last data filed at 05/06/2024 0804  Gross per 24 hour   Intake 980 ml   Output 1575 ml   Net -595 ml        Exam:     Physical Exam:    Gen:  Well-developed, well-nourished, morbidly obese, mild distress  HEENT:  Pink conjunctivae, PERRL, hearing intact to voice, moist mucous membranes  Neck:  Supple, without masses, thyroid non-tender  Resp:  + accessory muscle use, scattered wheezing  Card:  No murmurs, normal S1, S2 without thrills, bruits or peripheral edema  Abd:  Soft, non-tender, non-distended, normoactive bowel sounds are present  Musc:  No cyanosis or clubbing  Skin:  No rashes   Neuro:  Cranial nerves 3-12 are grossly intact, follows commands appropriately  Psych:  Good insight, oriented to person, place and time, alert    Medications Reviewed: (see below)    Lab Data Reviewed: (see below)    ______________________________________________________________________    Medications:     Current Facility-Administered Medications   Medication Dose Route Frequency    traMADol  (ULTRAM ) tablet 50 mg  50 mg Oral Q6H PRN    cyclobenzaprine  (FLEXERIL ) tablet 10 mg  10 mg Oral TID PRN    bumetanide  (BUMEX ) injection 1 mg  1 mg IntraVENous Daily     ipratropium 0.5 mg-albuterol  2.5 mg (DUONEB ) nebulizer solution 1 Dose  1 Dose Inhalation Q6H WA RT    methylPREDNISolone  sodium succ (SOLU-MEDROL ) 40 mg in sterile water  1 mL injection  40 mg IntraVENous Q8H    guaiFENesin  (MUCINEX ) extended release tablet 600 mg  600 mg Oral BID    lidocaine  4 % external patch 1 patch  1 patch TransDERmal Daily PRN    ipratropium 0.5 mg-albuterol  2.5 mg (DUONEB ) nebulizer solution 1 Dose  1 Dose Inhalation Q20 Min PRN    budesonide  (PULMICORT ) nebulizer suspension 500 mcg  0.5 mg Nebulization BID RT    arformoterol  tartrate (BROVANA ) nebulizer solution 15 mcg  15 mcg Nebulization BID RT    doxycycline  (VIBRAMYCIN ) 100 mg in sodium chloride  0.9 % 100 mL IVPB (addEASE)  100 mg IntraVENous Q12H    benzonatate  (TESSALON ) capsule 100 mg  100 mg Oral TID PRN    sodium chloride  flush 0.9 % injection 5-40 mL  5-40 mL IntraVENous 2 times per day  sodium chloride  flush 0.9 % injection 5-40 mL  5-40 mL IntraVENous PRN    0.9 % sodium chloride  infusion   IntraVENous PRN    ondansetron  (ZOFRAN -ODT) disintegrating tablet 4 mg  4 mg Oral Q8H PRN    Or    ondansetron  (ZOFRAN ) injection 4 mg  4 mg IntraVENous Q6H PRN    polyethylene glycol (GLYCOLAX ) packet 17 g  17 g Oral Daily PRN    acetaminophen  (TYLENOL ) tablet 650 mg  650 mg Oral Q6H PRN    Or    acetaminophen  (TYLENOL ) suppository 650 mg  650 mg Rectal Q6H PRN    albuterol  (PROVENTIL ) (2.5 MG/3ML) 0.083% nebulizer solution 1.25 mg  1.25 mg Nebulization Q2H PRN    pantoprazole  (PROTONIX ) tablet 40 mg  40 mg Oral BID AC    sertraline  (ZOLOFT ) tablet 100 mg  100 mg Oral Daily    escitalopram  (LEXAPRO ) tablet 5 mg  5 mg Oral Daily    HYDROmorphone  (DILAUDID ) tablet 1 mg  1 mg Oral Q4H PRN    HYDROmorphone  (DILAUDID ) tablet 2 mg  2 mg Oral Q4H PRN    lactobacillus (CULTURELLE) capsule 1 capsule  1 capsule Oral Daily with breakfast    glucose chewable tablet 16 g  4 tablet Oral PRN    dextrose  bolus 10% 125 mL  125 mL IntraVENous PRN    Or     dextrose  bolus 10% 250 mL  250 mL IntraVENous PRN    glucagon  injection 1 mg  1 mg SubCUTAneous PRN    dextrose  10 % infusion   IntraVENous Continuous PRN    insulin  lispro (HUMALOG ,ADMELOG ) injection vial 0-8 Units  0-8 Units SubCUTAneous 4x Daily AC & HS    apixaban  (ELIQUIS ) tablet 5 mg  5 mg Oral BID    metoprolol  tartrate (LOPRESSOR ) tablet 25 mg  25 mg Oral BID          Lab Review:     Recent Labs     05/05/24  0606   WBC 8.3   HGB 11.2*   HCT 36.7   PLT 136*     Recent Labs     05/05/24  0606 05/06/24  0319   NA 144 144   K 3.3* 4.4   CL 97* 101   CO2 36* 33*   BUN 35* 37*   MG 2.0 2.3   PHOS 3.1 2.6     No results found for: GLUCPOC       Assessment / Plan:     63 yo Hx of HTN, DM, Pafib on Eliquis , COPD on 3L, pancreatitis, etoh cirrhosis, presented w/ COPD exacerbation     1) Acute on chronic resp failure/hypoxia: slow to improve.  Due to COPD.  Chest CTA neg for PE.  Normally on 3L O2 at home.  Will wean O2 as tolerated.  Use Diamox  to control met alkalosis    2) Acute COPD exacerbation: slow to improve.  Unclear trigger.  Flu and COVID  negative.  Cont IV solumedrol (wean slowly), duonebs, Doxy.  Pulm following    3) Bilateral pleural effusions: likely due to diastolic dysfunction from COPD.  Recent Echo w/ EF 61%.  Will cont IV bumex  and diamox  prn for fluid control    4) HTN/Pafib: cont metoprolol , eliquis     5) DM type 2: A1C 8.2%.  cont SSI    6) Chronic pain: cont flexeril     7) Etoh cirrhosis: synthetic function intact.  F/u outpatient  8) Weakness: PT/OT recommended HH    **Prior records, notes, labs, radiology, and medications reviewed in [facility]**    Total time spent with patient care: 72 Minutes **I personally saw and examined the patient during this time period**                 Care Plan discussed with: Patient, nursing, CM    Discussed:  Care Plan    Prophylaxis:  eliquis     Disposition:  Lives in hotel            ___________________________________________________    Attending  Physician: Rodrick Has, MD

## 2024-05-06 NOTE — Plan of Care (Signed)
 Problem: Chronic Conditions and Co-morbidities  Goal: Patient's chronic conditions and co-morbidity symptoms are monitored and maintained or improved  Outcome: Progressing     Problem: Safety - Adult  Goal: Free from fall injury  Outcome: Progressing     Problem: Pain  Goal: Verbalizes/displays adequate comfort level or baseline comfort level  Outcome: Progressing     Problem: Respiratory - Adult  Goal: Achieves optimal ventilation and oxygenation  05/07/2024 0758 by Fairy Maus, RN  Outcome: Progressing  05/07/2024 0740 by Lorene Perkins, RCP  Outcome: Progressing  Flowsheets (Taken 05/06/2024 1942 by Fairy Maus, RN)  Achieves optimal ventilation and oxygenation: Assess for changes in respiratory status  05/06/2024 1918 by Vinita Etta SQUIBB, RT  Outcome: Progressing     Problem: Skin/Tissue Integrity  Goal: Skin integrity remains intact  Description: 1.  Monitor for areas of redness and/or skin breakdown  2.  Assess vascular access sites hourly  3.  Every 4-6 hours minimum:  Change oxygen saturation probe site  4.  Every 4-6 hours:  If on nasal continuous positive airway pressure, respiratory therapy assess nares and determine need for appliance change or resting period  Outcome: Progressing     Problem: Discharge Planning  Goal: Discharge to home or other facility with appropriate resources  Outcome: Progressing     Problem: Cardiovascular - Adult  Goal: Maintains optimal cardiac output and hemodynamic stability  Outcome: Progressing     Problem: Skin/Tissue Integrity - Adult  Goal: Skin integrity remains intact  Description: 1.  Monitor for areas of redness and/or skin breakdown  2.  Assess vascular access sites hourly  3.  Every 4-6 hours minimum:  Change oxygen saturation probe site  4.  Every 4-6 hours:  If on nasal continuous positive airway pressure, respiratory therapy assess nares and determine need for appliance change or resting period  Outcome: Progressing     Problem: Musculoskeletal -  Adult  Goal: Return mobility to safest level of function  Outcome: Progressing  Goal: Return ADL status to a safe level of function  Outcome: Progressing     Problem: Infection - Adult  Goal: Absence of infection during hospitalization  Outcome: Progressing     Problem: Metabolic/Fluid and Electrolytes - Adult  Goal: Electrolytes maintained within normal limits  Outcome: Progressing  Goal: Glucose maintained within prescribed range  Outcome: Progressing

## 2024-05-07 LAB — PHOSPHORUS: Phosphorus: 2.8 mg/dL (ref 2.5–4.5)

## 2024-05-07 LAB — BASIC METABOLIC PANEL
Anion Gap: 10 mmol/L (ref 2–14)
BUN/Creatinine Ratio: 36 — ABNORMAL HIGH (ref 12–20)
BUN: 33 mg/dL — ABNORMAL HIGH (ref 8–23)
CO2: 29 mmol/L (ref 20–29)
Calcium: 8.5 mg/dL — ABNORMAL LOW (ref 8.8–10.2)
Chloride: 102 mmol/L (ref 98–107)
Creatinine: 0.91 mg/dL (ref 0.60–1.00)
Est, Glom Filt Rate: 71 ml/min/1.73m2 (ref >59)
Glucose: 232 mg/dL — ABNORMAL HIGH (ref 65–100)
Potassium: 4 mmol/L (ref 3.5–5.1)
Sodium: 141 mmol/L (ref 136–145)

## 2024-05-07 LAB — BRAIN NATRIURETIC PEPTIDE: NT Pro-BNP: 1068 pg/mL — ABNORMAL HIGH (ref 0–125)

## 2024-05-07 LAB — POCT GLUCOSE
POC Glucose: 208 mg/dL — ABNORMAL HIGH (ref 65–117)
POC Glucose: 240 mg/dL — ABNORMAL HIGH (ref 65–117)
POC Glucose: 242 mg/dL — ABNORMAL HIGH (ref 65–117)
POC Glucose: 297 mg/dL — ABNORMAL HIGH (ref 65–117)

## 2024-05-07 LAB — PROCALCITONIN: Procalcitonin: 0.07 ng/mL

## 2024-05-07 LAB — MAGNESIUM: Magnesium: 2.3 mg/dL (ref 1.6–2.4)

## 2024-05-07 LAB — C-REACTIVE PROTEIN: CRP: 0.6 mg/dL — ABNORMAL HIGH (ref 0.0–0.5)

## 2024-05-07 MED ORDER — METHYLPREDNISOLONE SODIUM SUCC 40 MG IJ SOLR
40 | Freq: Two times a day (BID) | INTRAMUSCULAR | Status: DC
Start: 2024-05-07 — End: 2024-05-09
  Administered 2024-05-08 – 2024-05-09 (×4): 40 mg via INTRAVENOUS

## 2024-05-07 MED ORDER — BUMETANIDE 1 MG PO TABS
1 | Freq: Every day | ORAL | Status: DC
Start: 2024-05-07 — End: 2024-05-09
  Administered 2024-05-07 – 2024-05-09 (×3): 1 mg via ORAL

## 2024-05-07 MED ORDER — INSULIN GLARGINE 100 UNIT/ML SC SOLN
100 | Freq: Every day | SUBCUTANEOUS | Status: DC
Start: 2024-05-07 — End: 2024-05-08
  Administered 2024-05-07: 14:00:00 20 [IU] via SUBCUTANEOUS

## 2024-05-07 MED ORDER — BUDESONIDE 0.5 MG/2ML IN SUSP
0.5 | Freq: Two times a day (BID) | RESPIRATORY_TRACT | Status: DC
Start: 2024-05-07 — End: 2024-05-09
  Administered 2024-05-08 – 2024-05-09 (×4): 1000 mg via RESPIRATORY_TRACT

## 2024-05-07 MED FILL — LANTUS 100 UNIT/ML SC SOLN: 100 [IU]/mL | SUBCUTANEOUS | Qty: 20 | Fill #0

## 2024-05-07 MED FILL — BUMETANIDE 1 MG PO TABS: 1 mg | ORAL | Qty: 1 | Fill #0

## 2024-05-07 MED FILL — DOXYCYCLINE HYCLATE 100 MG PO TABS: 100 mg | ORAL | Qty: 1 | Fill #0

## 2024-05-07 NOTE — Plan of Care (Signed)
 Problem: Physical Therapy - Adult  Goal: By Discharge: Performs mobility at highest level of function for planned discharge setting.  See evaluation for individualized goals.  Description: FUNCTIONAL STATUS PRIOR TO ADMISSION: Patient was independent and active without use of DME.    HOME SUPPORT PRIOR TO ADMISSION: The patient lived with daughter but did not require assistance. Lives with duaghter that is high level autistic that works part time. Both live together in Extended stay motel    Physical Therapy Goals  Initiated 05/03/2024  1.  Patient will move from supine to sit and sit to supine in bed with modified independence within 7 day(s).    2.  Patient will perform sit to stand with modified independence within 7 day(s).  3.  Patient will transfer from bed to chair and chair to bed with modified independence using the least restrictive device within 7 day(s).  4.  Patient will ambulate with modified independence for 150 feet with the least restrictive device within 7 day(s).       05/07/2024 1406 by Rayleen Search, PT  Outcome: Progressing     PHYSICAL THERAPY TREATMENT    Patient: Breanna Lester (63 y.o. female)  Date: 05/07/2024  Diagnosis: COPD exacerbation (HCC) [J44.1]  New onset of congestive heart failure (HCC) [I50.9]  Acute hypoxic respiratory failure (HCC) [J96.01] Acute hypoxic respiratory failure (HCC)      Precautions:              ASSESSMENT:  Patient continues to benefit from skilled PT services and is progressing towards goals. Patient tolerated session fairly well, but continues to present with decreased activity tolerance, gait stability, and endurance. Trialed gait with a rollator for energy conservation and to reduce fall risk; patient able to manage device safely including brake engagement and seated rest break. SpO2 89% on 3L with exertion after ambulating 30 ft with recovery to 90% within 30 sec of focused breathing and rest. Discussed ECT strategies. Patient will benefit from continued  PT intervention to maximize functional independence prior to discharge.         PLAN:  Patient continues to benefit from skilled intervention to address the above impairments.  Continue treatment per established plan of care.        Recommendation for discharge: (in order for the patient to meet his/her long term goals):   Intermittent physical therapy up to 2-3x/week in previous living setting    Other factors to consider for discharge: no additional factors    IF patient discharges home will need the following DME: rollator  Breanna Lester was evaluated today and a DME order was entered for a wheeled walker with seat because she requires this to successfully complete daily living tasks of ambulating. A wheeled walker with seat is necessary due to the patient's unsteady gait, upper body weakness, inability to pick up and ambulation device, ambulating only short distances by pushing a walker, and the need to sit for a short time before resuming ambulation due to oxygen desaturation. These tasks cannot be completed with a lesser ambulation device such as a cane, crutch, or standard walker. The need for this equipment was discussed with the patient and she understands and is in agreement.          SUBJECTIVE:   Patient cooperative and pleasant    OBJECTIVE DATA SUMMARY:   Critical Behavior:  Orientation  Overall Orientation Status: Within Normal Limits  Orientation Level: Oriented X4  Cognition  Overall Cognitive Status: WNL  Functional Mobility Training:  Bed Mobility:  Bed Mobility Training  Bed Mobility Training: No  Supine to Sit: Minimal assistance  Transfers:  Transfer Training  Transfer Training: Yes  Sit to Stand: Stand by assistance  Stand to Sit: Stand by assistance  Bed to Chair: Stand by assistance  Toilet Transfer: Stand by assistance  Balance:  Balance  Sitting: Intact  Standing - Static: Constant support;Good  Standing - Dynamic: Constant support;Good   Ambulation/Gait Training:     Gait  Gait  Training: Yes  Overall Level of Assistance: Stand by assistance  Distance (ft): 30 Feet (x2 with seated rest break on rollator)  Assistive Device: Gait belt;Walker, rollator  Interventions: Verbal cues;Safety awareness training  Base of Support: Widened  Speed/Cadence: Pace decreased (< 100 feet/min);Slow  Step Length: Left shortened;Right shortened  Gait Abnormalities: Decreased step clearance        Education:  Patient was educated on the use of portable pulse oximeter at home to maintain SpO2 > 90%.  Patient was instructed in pursed lip breathing to ensure appropriate oxygenation.     Energy Conservation  Patient was educated on the following techniques to reduce fall risk, promote community engagement, and increase ADL safety:  -Incorporate seated/standing rest breaks  -Be aware of abnormal symptoms such as shortness of breath   -Engage in pursed lip breathing & home oxygenation monitoring  -Perform tasks while seated to conserve energy  -Spread tasks throughout the day and prioritize by importance  -Gradually work toward tolerating increased activity each day            Pain Rating:  0/10   Pain Intervention(s):       Activity Tolerance:   Fair , requires rest breaks, observed shortness of breath on exertion, and desaturates with activity and requires oxygen    After treatment:   Patient left in no apparent distress sitting up in chair, Call bell within reach, Bed/ chair alarm activated, and Updated patient's board on functional status and mobility recommendations      COMMUNICATION/EDUCATION:   The patient's plan of care was discussed with: occupational therapist and registered nurse    Patient Education  Education Given To: Patient  Education Provided: Role of Therapy;Plan of Care;Energy Conservation;IADL Safety;Equipment;Fall Prevention Strategies  Education Method: Verbal;Teach Back  Barriers to Learning: None  Education Outcome: Verbalized understanding      Joesph Reels, PT  Minutes: 25

## 2024-05-07 NOTE — Plan of Care (Addendum)
 Problem: Physical Therapy - Adult  Goal: By Discharge: Performs mobility at highest level of function for planned discharge setting.  See evaluation for individualized goals.  Description: FUNCTIONAL STATUS PRIOR TO ADMISSION: Patient was independent and active without use of DME.    HOME SUPPORT PRIOR TO ADMISSION: The patient lived with daughter but did not require assistance. Lives with duaghter that is high level autistic that works part time. Both live together in Extended stay motel    Physical Therapy Goals  Initiated 05/03/2024  1.  Patient will move from supine to sit and sit to supine in bed with modified independence within 7 day(s).    2.  Patient will perform sit to stand with modified independence within 7 day(s).  3.  Patient will transfer from bed to chair and chair to bed with modified independence using the least restrictive device within 7 day(s).  4.  Patient will ambulate with modified independence for 150 feet with the least restrictive device within 7 day(s).                                                                                                                                             LATE ENTRY FOR 05-06-24  Outcome: Progressing                                                                                               PHYSICAL THERAPY TREATMENT    Patient: Breanna Lester (63 y.o. female)  Date: 05/07/2024                                                                                                    Diagnosis: COPD exacerbation (HCC) [J44.1]  New onset of congestive heart failure (HCC) [I50.9]  Acute hypoxic respiratory failure (HCC) [J96.01] Acute hypoxic respiratory failure (HCC)      Precautions:              ASSESSMENT:  Pt seen yesterday.Patient continues to benefit from skilled PT services.Pt supine to sit with min assist.Pt CGA sit to stand.Pt ambulated 93ft to restroom and  back with CGA on O2.Pt fatigues quickly.Pt left sitting in chair.Pt progressing  slowly.Continue.goals.         PLAN:  Patient continues to benefit from skilled intervention to address the above impairments.  Continue treatment per established plan of care.        Recommendation for discharge: (in order for the patient to meet his/her long term goals):   Intermittent physical therapy up to 2-3x/week in previous living setting    Other factors to consider for discharge:      IF patient discharges home will need the following DME: rolling walker       SUBJECTIVE:       OBJECTIVE DATA SUMMARY:   Critical Behavior:          Functional Mobility Training:  Bed Mobility:  Bed Mobility Training  Supine to Sit: Minimal assistance  Transfers:  Transfer Training  Sit to Stand: Contact guard assistance  Balance:  Balance  Sitting: Intact   Ambulation/Gait Training:     Gait  Distance (ft): 20 Feet  Assistive Device: Gait belt;Walker, rolling  Base of Support: Widened  Speed/Cadence: Pace decreased (< 100 feet/min);Slow  Step Length: Left shortened;Right shortened  Gait Abnormalities: Decreased step clearance            Activity Tolerance:   Fair     After treatment:   Patient left in no apparent distress sitting up in chair      COMMUNICATION/EDUCATION:   The patient's plan of care was discussed with: physical therapist           Marcey HERO. Victory Strollo, PTA  Minutes: 38

## 2024-05-07 NOTE — Progress Notes (Addendum)
 Physical Therapy    Treatment session attempted, however patient requesting to rest after received sleeping. Will continue to follow.    Thank you,  Joesph Reels, PT, DPT

## 2024-05-07 NOTE — Progress Notes (Cosign Needed)
 Name: Breanna Lester Lionel: [facility]   DOB: 1961/02/09 Admit Date: 05/02/2024   Phone: [phone]  Room: 511/01   PCP: Unknown, Provider, NP-C  MRN: [id number]   Date: 05/07/2024  Code: Full Code        HPI:      Chart and notes reviewed. Data reviewed. I review the patient's current medications in the medical record at each encounter.  I have evaluated and examined the patient.     3:41 PM       History was obtained from patient.     I was asked by Consepcion LOISE Coombs, MD to see Breanna Lester in consultation for a chief complaint of COPD exacerbation, pleural effusion.    History of Present Illness:  Ms.Shackleton is a very pleasant, 63 yo lady with a PMH of anxiety/depression, hypertension, hyperlipidemia, COPD (no PFTs to review), obesity, DM, a-fib that presented to [facility] with worsening shortness of breath.  She was recently hospitalized from 9/3-95 with acute GI bleed and 8/28-8/28 with sepsis thought to be due to pneumonia. She was treated while hospitalized with CTX and discharged home on CTX/Azithromycin .   She reports that she continued to feel short of breath and has been using a rescue inhaler at least 6-7 times per day. She does not have a nebulizer at home. She was sent home with oxygen after her recent discharge and is wearing 2L.  She does not use a maintenance inhaler at home. She has never been tested for OSA.   She smoked 1ppd for about 15 years and quit 15 years ago.  Her mother had COPD.  She saw a pulmonologist once in [place], but never had PFTs.    Images:    Chest CT 8/26: new patchy right upper lobe asd    Chest CTA 9/14: no PE.  No aortic aneurysm. Moderate right and small left sided pleural effusion with bibasilar atx    WBC 8.3  Pro-BNP 1427--3,447 on admission     Interval History:  Afebrile  HR 50s  BP stable/elevated  Sats 97% on 3L NC  TTE: EF 70-75%, moderate pericardial effusion 1-2 cm, posterior pericardial effusion measuring 1.0cm, RV moderately dilated, normal  systolic function, RA mildly dilated  1200 documented UOP      ROS:  Feels minimally better, sitting up in chair.  Still more SOB than usual.    Past Medical History:   Diagnosis Date    Anxiety     Depression     Gastrointestinal disorder     Heart failure (HCC)     Hyperlipidemia     Hypertension     Liver disease 2009    liver failure    Pneumonia        Past Surgical History:   Procedure Laterality Date    ADENOIDECTOMY  1972    BACK SURGERY      cement discs following a fall    CATARACT REMOVAL      CESAREAN SECTION  1986 and 1993    DILATION AND CURETTAGE OF UTERUS  1999    EGD TRANSORAL BIOPSY SINGLE/MULTIPLE  05/17/2009         HYSTERECTOMY (CERVIX STATUS UNKNOWN)  2008    TONSILLECTOMY  1972    UPPER GASTROINTESTINAL ENDOSCOPY N/A 04/17/2024    ESOPHAGOGASTRODUODENOSCOPY performed by Madlyn Fendt, MD at [facility] ENDOSCOPY       Family History   Problem Relation Age of Onset    Cancer Mother  Heart Disease Father        Social History     Tobacco Use    Smoking status: Former     Current packs/day: 0.00     Types: Cigarettes     Quit date: 02/28/2008     Years since quitting: 16.2    Smokeless tobacco: Never   Substance Use Topics    Alcohol use: Never       Allergies   Allergen Reactions    Latex Rash       Current Facility-Administered Medications   Medication Dose Route Frequency    insulin  glargine (LANTUS ) injection vial 20 Units  20 Units SubCUTAneous QAM AC    bumetanide  (BUMEX ) tablet 1 mg  1 mg Oral Daily    traMADol  (ULTRAM ) tablet 50 mg  50 mg Oral Q6H PRN    cyclobenzaprine  (FLEXERIL ) tablet 10 mg  10 mg Oral TID PRN    doxycycline  hyclate (VIBRA -TABS) tablet 100 mg  100 mg Oral 2 times per day    ipratropium 0.5 mg-albuterol  2.5 mg (DUONEB ) nebulizer solution 1 Dose  1 Dose Inhalation Q6H WA RT    methylPREDNISolone  sodium succ (SOLU-MEDROL ) 40 mg in sterile water  1 mL injection  40 mg IntraVENous Q8H    guaiFENesin  (MUCINEX ) extended release tablet 600 mg  600 mg Oral BID    lidocaine  4 % external  patch 1 patch  1 patch TransDERmal Daily PRN    ipratropium 0.5 mg-albuterol  2.5 mg (DUONEB ) nebulizer solution 1 Dose  1 Dose Inhalation Q20 Min PRN    budesonide  (PULMICORT ) nebulizer suspension 500 mcg  0.5 mg Nebulization BID RT    arformoterol  tartrate (BROVANA ) nebulizer solution 15 mcg  15 mcg Nebulization BID RT    benzonatate  (TESSALON ) capsule 100 mg  100 mg Oral TID PRN    sodium chloride  flush 0.9 % injection 5-40 mL  5-40 mL IntraVENous 2 times per day    sodium chloride  flush 0.9 % injection 5-40 mL  5-40 mL IntraVENous PRN    0.9 % sodium chloride  infusion   IntraVENous PRN    ondansetron  (ZOFRAN -ODT) disintegrating tablet 4 mg  4 mg Oral Q8H PRN    Or    ondansetron  (ZOFRAN ) injection 4 mg  4 mg IntraVENous Q6H PRN    polyethylene glycol (GLYCOLAX ) packet 17 g  17 g Oral Daily PRN    acetaminophen  (TYLENOL ) tablet 650 mg  650 mg Oral Q6H PRN    Or    acetaminophen  (TYLENOL ) suppository 650 mg  650 mg Rectal Q6H PRN    albuterol  (PROVENTIL ) (2.5 MG/3ML) 0.083% nebulizer solution 1.25 mg  1.25 mg Nebulization Q2H PRN    pantoprazole  (PROTONIX ) tablet 40 mg  40 mg Oral BID AC    sertraline  (ZOLOFT ) tablet 100 mg  100 mg Oral Daily    escitalopram  (LEXAPRO ) tablet 5 mg  5 mg Oral Daily    HYDROmorphone  (DILAUDID ) tablet 1 mg  1 mg Oral Q4H PRN    HYDROmorphone  (DILAUDID ) tablet 2 mg  2 mg Oral Q4H PRN    lactobacillus (CULTURELLE) capsule 1 capsule  1 capsule Oral Daily with breakfast    glucose chewable tablet 16 g  4 tablet Oral PRN    dextrose  bolus 10% 125 mL  125 mL IntraVENous PRN    Or    dextrose  bolus 10% 250 mL  250 mL IntraVENous PRN    glucagon  injection 1 mg  1 mg SubCUTAneous PRN    dextrose  10 %  infusion   IntraVENous Continuous PRN    insulin  lispro (HUMALOG ,ADMELOG ) injection vial 0-8 Units  0-8 Units SubCUTAneous 4x Daily AC & HS    apixaban  (ELIQUIS ) tablet 5 mg  5 mg Oral BID    [Held by provider] metoprolol  tartrate (LOPRESSOR ) tablet 25 mg  25 mg Oral BID         REVIEW OF SYSTEMS    Negative except as stated in the HPI.      Physical Exam:   Vitals:    05/07/24 1331   BP:    Pulse: 54   Resp: 17   Temp:    SpO2: 97%       General:  Alert, cooperative, no distress, appears stated age.   Head:  Normocephalic, without obvious abnormality, atraumatic.   Eyes:  Conjunctivae/corneas clear.    Nose: Nares normal. Septum midline. Mucosa normal.    Throat: Lips, mucosa, and tongue normal. Teeth and gums normal.   Neck: Supple, symmetrical, trachea midline, no adenopathy   Back:   Symmetric, no curvature. ROM normal.   Lungs:   Wheezing bilaterally, moderate air movement   Chest wall:  No tenderness or deformity.   Heart:  Regular rate and rhythm, S1, S2 normal, no murmur, click, rub or gallop.   Abdomen:   Soft, non-tender. Bowel sounds normal.    Extremities: Extremities normal, atraumatic, no cyanosis or edema.   Pulses: 2+ and symmetric all extremities.   Skin: Skin color, texture, turgor normal   Neurologic: Grossly nonfocal       Lab Results   Component Value Date/Time    NA 141 05/07/2024 05:47 AM    K 4.0 05/07/2024 05:47 AM    CL 102 05/07/2024 05:47 AM    CO2 29 05/07/2024 05:47 AM    BUN 33 05/07/2024 05:47 AM    MG 2.3 05/07/2024 05:47 AM    PHOS 2.8 05/07/2024 05:47 AM       Lab Results   Component Value Date/Time    WBC 8.3 05/05/2024 06:06 AM    HGB 11.2 05/05/2024 06:06 AM    PLT 136 05/05/2024 06:06 AM    MCV 87.0 05/05/2024 06:06 AM       Lab Results   Component Value Date/Time    INR 1.3 05/03/2024 11:21 AM    GLOB 3.0 05/02/2024 02:44 PM       Lab Results   Component Value Date/Time    IRON 40 04/24/2024 04:59 AM    TIBC 292 04/24/2024 04:59 AM       Lab Results   Component Value Date/Time    CRP 0.6 05/07/2024 05:47 AM    ANA Negative 04/15/2024 03:15 PM    TSH 3.57 03/19/2022 12:25 PM        @BRIEFLAB24 (ph,phi,pco2,pco2i,po2,po2i,hco3,hco3i,fio2,fio2i)@    No results found for: CPK, BNP     No components found for: CULT    No results found for: CMV, RPR, HAAB, HCAB1,  G6PD, HIVR, HIV1, HIV12, HIVPC, HIVRPI    No results found for: CPK    No results found for: COLOR, CASTS    IMPRESSION  Acute on Chronic Hypoxic Respiratory Failure, initially required 4L on admission  Abnormal Chest CT: bilateral pleural effusions, cardiomegaly, and coronary vascular calcifications concerning for decompensated heart failure  Pericardial Effusion  Wheezing/Suspected COPD  Former Tobacco Use  Obesity    PLAN  Supplemental oxygen to keep sats >90%, currently on baseline 2L  Continue Bumex  as per primary team. Check TTE  Brovana /Pulmicort  (increase dose), scheduled Duo-nebs. Needs to go home on a maintenance inhaler such as Trelegy or Breztri.  Continue IV steroids for now, wean slightly today  Cardiology has evaluated and signed off  Continue Doxycycline  for 5 days total  Arrange nebulizer machine at discharge  Encourage IS use  OOB  Eliquis         Margarie Minor, APRN - NP

## 2024-05-07 NOTE — Progress Notes (Signed)
 [facility]  [address] [place], [place] [address]  [phone]        Hospitalist Progress Note      NAME: Breanna Lester   DOB:  [birth year]  MRM:  [id number]    Date/Time of service: 05/07/2024  11:40 AM       Subjective:     Chief Complaint:  Patient was personally seen and examined by me during this time period.  Chart reviewed.  Sitting up, dyspnea is improving       Objective:       Vitals:       Last 24hrs VS reviewed since prior progress note. Most recent are:    Vitals:    05/07/24 1020   BP:    Pulse:    Resp: 21   Temp:    SpO2:      SpO2 Readings from Last 6 Encounters:   05/07/24 98%   04/25/24 96%   04/17/24 97%   04/11/24 95%   02/26/24 94%   02/18/24 92%        No intake or output data in the 24 hours ending 05/07/24 1140       Exam:     Physical Exam:    Gen:  Well-developed, well-nourished, morbidly obese, NAD  HEENT:  Pink conjunctivae, PERRL, hearing intact to voice, moist mucous membranes  Neck:  Supple, without masses, thyroid non-tender  Resp:  + accessory muscle use, scattered wheezing, better air movement  Card:  No murmurs, normal S1, S2 without thrills, bruits or peripheral edema  Abd:  Soft, non-tender, non-distended, normoactive bowel sounds are present  Musc:  No cyanosis or clubbing  Skin:  No rashes   Neuro:  Cranial nerves 3-12 are grossly intact, follows commands appropriately  Psych:  Good insight, oriented to person, place and time, alert    Medications Reviewed: (see below)    Lab Data Reviewed: (see below)    ______________________________________________________________________    Medications:     Current Facility-Administered Medications   Medication Dose Route Frequency    insulin  glargine (LANTUS ) injection vial 20 Units  20 Units SubCUTAneous QAM AC    bumetanide  (BUMEX ) tablet 1 mg  1 mg Oral Daily    traMADol  (ULTRAM ) tablet 50 mg  50 mg Oral Q6H PRN    cyclobenzaprine  (FLEXERIL ) tablet 10 mg  10 mg Oral TID PRN    doxycycline  hyclate  (VIBRA -TABS) tablet 100 mg  100 mg Oral 2 times per day    ipratropium 0.5 mg-albuterol  2.5 mg (DUONEB ) nebulizer solution 1 Dose  1 Dose Inhalation Q6H WA RT    methylPREDNISolone  sodium succ (SOLU-MEDROL ) 40 mg in sterile water  1 mL injection  40 mg IntraVENous Q8H    guaiFENesin  (MUCINEX ) extended release tablet 600 mg  600 mg Oral BID    lidocaine  4 % external patch 1 patch  1 patch TransDERmal Daily PRN    ipratropium 0.5 mg-albuterol  2.5 mg (DUONEB ) nebulizer solution 1 Dose  1 Dose Inhalation Q20 Min PRN    budesonide  (PULMICORT ) nebulizer suspension 500 mcg  0.5 mg Nebulization BID RT    arformoterol  tartrate (BROVANA ) nebulizer solution 15 mcg  15 mcg Nebulization BID RT    benzonatate  (TESSALON ) capsule 100 mg  100 mg Oral TID PRN    sodium chloride  flush 0.9 % injection 5-40 mL  5-40 mL IntraVENous 2 times per day    sodium chloride  flush 0.9 % injection 5-40 mL  5-40  mL IntraVENous PRN    0.9 % sodium chloride  infusion   IntraVENous PRN    ondansetron  (ZOFRAN -ODT) disintegrating tablet 4 mg  4 mg Oral Q8H PRN    Or    ondansetron  (ZOFRAN ) injection 4 mg  4 mg IntraVENous Q6H PRN    polyethylene glycol (GLYCOLAX ) packet 17 g  17 g Oral Daily PRN    acetaminophen  (TYLENOL ) tablet 650 mg  650 mg Oral Q6H PRN    Or    acetaminophen  (TYLENOL ) suppository 650 mg  650 mg Rectal Q6H PRN    albuterol  (PROVENTIL ) (2.5 MG/3ML) 0.083% nebulizer solution 1.25 mg  1.25 mg Nebulization Q2H PRN    pantoprazole  (PROTONIX ) tablet 40 mg  40 mg Oral BID AC    sertraline  (ZOLOFT ) tablet 100 mg  100 mg Oral Daily    escitalopram  (LEXAPRO ) tablet 5 mg  5 mg Oral Daily    HYDROmorphone  (DILAUDID ) tablet 1 mg  1 mg Oral Q4H PRN    HYDROmorphone  (DILAUDID ) tablet 2 mg  2 mg Oral Q4H PRN    lactobacillus (CULTURELLE) capsule 1 capsule  1 capsule Oral Daily with breakfast    glucose chewable tablet 16 g  4 tablet Oral PRN    dextrose  bolus 10% 125 mL  125 mL IntraVENous PRN    Or    dextrose  bolus 10% 250 mL  250 mL IntraVENous PRN     glucagon  injection 1 mg  1 mg SubCUTAneous PRN    dextrose  10 % infusion   IntraVENous Continuous PRN    insulin  lispro (HUMALOG ,ADMELOG ) injection vial 0-8 Units  0-8 Units SubCUTAneous 4x Daily AC & HS    apixaban  (ELIQUIS ) tablet 5 mg  5 mg Oral BID    [Held by provider] metoprolol  tartrate (LOPRESSOR ) tablet 25 mg  25 mg Oral BID          Lab Review:     Recent Labs     05/05/24  0606   WBC 8.3   HGB 11.2*   HCT 36.7   PLT 136*     Recent Labs     05/05/24  0606 05/06/24  0319 05/07/24  0547   NA 144 144 141   K 3.3* 4.4 4.0   CL 97* 101 102   CO2 36* 33* 29   BUN 35* 37* 33*   MG 2.0 2.3 2.3   PHOS 3.1 2.6 2.8     No results found for: GLUCPOC       Assessment / Plan:     63 yo Hx of HTN, DM, Pafib on Eliquis , COPD on 3L, pancreatitis, etoh cirrhosis, presented w/ COPD exacerbation     1) Acute on chronic resp failure/hypoxia: improved, back to baseline O2.  Due to COPD.  Chest CTA neg for PE.  Normally on 3L O2 at home.  Will use Diamox  prn to control met alkalosis    2) Acute COPD exacerbation: improving.  Unclear trigger.  Flu and COVID  negative.  Cont IV solumedrol (wean slowly), duonebs, Doxy.  Pulm following    3) Bilateral pleural effusions: likely due to diastolic dysfunction from COPD.  Recent Echo w/ EF 61%. Will change IV bumex  to PO    4) HTN/Pafib: cont metoprolol , eliquis     5) DM type 2: A1C 8.2%.  Hyperglycemia from steroids.  Will start lantus .  cont SSI.  Consult DM educator     6) Chronic pain: cont flexeril     7) Etoh cirrhosis: synthetic function intact.  F/u outpatient     8) Weakness: PT/OT recommended HH    **Prior records, notes, labs, radiology, and medications reviewed in [facility]**    Total time spent with patient care: 32 Minutes **I personally saw and examined the patient during this time period**                 Care Plan discussed with: Patient, nursing, CM    Discussed:  Care Plan    Prophylaxis:  eliquis     Disposition:  Lives in hotel, likely discharge 09/19             ___________________________________________________    Attending Physician: Rodrick Has, MD

## 2024-05-07 NOTE — Progress Notes (Signed)
 Hospital follow up with Pulmonary set for Tuesday September 30th 2025 at 2:45 PM with PA [place]  Maughon at their [place] location.

## 2024-05-07 NOTE — Plan of Care (Addendum)
 Problem: Occupational Therapy - Adult  Goal: By Discharge: Performs self-care activities at highest level of function for planned discharge setting.  See evaluation for individualized goals.  Description: FUNCTIONAL STATUS PRIOR TO ADMISSION: The patient was independent for ADLs and functional mobility apart from min A for tub transfers and occasional assistance for LB dressing.  HOME SUPPORT: The patient lives with her daughter who works part time outside of the home. They are living in extended stay hotel.    Occupational Therapy Goals:  Initiated 05/04/2024  1.  Patient will perform grooming with Modified Independence in sitting within 7 day(s).  2.  Patient will perform upper body dressing with Supervision within 7 day(s).  3.  Patient will perform toilet transfers with Supervision  within 7 day(s).  4.  Patient will perform all aspects of toileting with Supervision within 7 day(s).  5.  Patient will participate in upper extremity therapeutic exercise/activities with Supervision for 5 minutes within 7 day(s).    6.  Patient will utilize energy conservation techniques during functional activities with verbal cues within 7 day(s).   Outcome: Progressing     OCCUPATIONAL THERAPY TREATMENT  Patient: Breanna Lester (63 y.o. female)  Date: 05/07/2024  Primary Diagnosis: COPD exacerbation (HCC) [J44.1]  New onset of congestive heart failure (HCC) [I50.9]  Acute hypoxic respiratory failure (HCC) [J96.01]       Precautions:                  Chart, occupational therapy assessment, plan of care, and goals were reviewed.    ASSESSMENT  Patient continues to benefit from skilled OT services and is progressing towards goals. Pt noted with progressive mobility/balance and awareness to activity pacing, with good SBA level toileting and standing grooming this date.  Pt noted to benefit from cues for brake management with Rollator usage, as well as, PLB when fatigued.  Pt continues to benefit from skilled OT during acute  hospitalization.    Of note, pt rec'd on 3 liters 02 with 02 dropping to 89% during ADL item gathering, with seated rest break and cues for PLB, quickly rebounded to 96%.  Energy conservation handout provided, with good understanding verbalized.       PLAN :  Patient continues to benefit from skilled intervention to address the above impairments.  Continue treatment per established plan of care to address goals.    Recommendations for mobility with staff:     Recommendations for toileting with staff:  recommended toilet device: the bathroom with staff assist x1 using  gait belt and rollator.       Recommend next OT session: POC progression    Recommendation for discharge: (in order for the patient to meet his/her long term goals):   Intermittent occupational therapy up to 2-3x/week in previous living setting    Other factors to consider for discharge: no additional factors    IF patient discharges home will need the following DME: transfer bench and rollator       SUBJECTIVE:   Patient stated "I think home health OT would be a good thing."    OBJECTIVE DATA SUMMARY:   Cognitive/Behavioral Status:  Orientation  Overall Orientation Status: Within Normal Limits  Orientation Level: Oriented X4  Cognition  Overall Cognitive Status: WNL    Functional Mobility and Transfers for ADLs:  Bed Mobility:  Bed Mobility Training  Supine to Sit: Minimal assistance     Transfers:   Transfer Training  Transfer Training: Yes  Sit to  Stand: Stand by assistance  Stand to Sit: Stand by assistance  Bed to Chair: Stand by assistance  Toilet Transfer: Stand by assistance         Pt educated on safe transfer techniques, with specific emphasis on proper hand placement to push up from seated surface rather than attempt to pull self up, fully positioning self in-front of desired seated location, feeling chair on back of legs and reaching back with 1-2 UE to slowly lower self to seated position.    Balance:     Balance  Sitting: Intact      ADL  Intervention:  Grooming: Stand by assistance   Grooming Skilled Clinical Factors: standing at sink to complete hand hygiene and oral hygiene    Toileting: Stand by assistance       Functional Mobility: Stand by assistance;Contact guard assistance  Functional Mobility Skilled Clinical Factors: CGA when fatigued, cues for Rollator safety.  02 at 3 liters noted to briefly drop to 89% duing ADL mobility; however, with seated rest break and cues for PLB, quickly rebouded to 96%     Therapeutic Activity:  Patient completed ADL/IADL item gathering thera ac, Rollator level, challenging higher level mobility/balance with multi-directional changes, sustained reach outside base of support, DME/transfer safety, problem solving/sequencing and activity tolerance, with SBA/CGA for dynamic aspects (when fatigued pt noted to benefit from CGA) and SBA for static aspects with no LOB or c/o pain.  Intermittent seated rest breaks implemented, with cues for Rollator brake management.    Activity Tolerance:   Fair , Poor, requires frequent rest breaks, observed shortness of breath on exertion, and desaturates with activity and requires oxygen  Please refer to the flowsheet for vital signs taken during this treatment.    After treatment:   Patient left in no apparent distress sitting up in chair, Call bell within reach, and Bed/ chair alarm activated    COMMUNICATION/EDUCATION:   The patient's plan of care was discussed with: physical therapist, registered nurse, and case manager    Patient Education  Education Given To: Patient  Education Provided: Transfer Training;Energy Conservation;IADL Safety  Education Provided Comments: recommendation for tub-transfer bench  Education Method: Verbal  Barriers to Learning: None  Education Outcome: Verbalized understanding;Demonstrated understanding    Thank you for this referral.  Donnice Mussel, OT  Minutes: 25

## 2024-05-07 NOTE — Consults (Incomplete)
 [facility]  [facility]  DIABETES MANAGEMENT CONSULT    Consulted by Provider for advanced nursing evaluation and care for inpatient blood glucose management.    Evaluation and Action Plan   Breanna Lester is a 63 y.o. female with T2DM, COPD on 2L NC at home, HTN, Afib, anxiety, depression, who was admitted with worsening SOB. She had a recent hospitalization for COPD exacerbation r/t PNA. Labs noted for BG 143, A1c 8.2%, NT pro-BNP 3,447. CT chest noted for pleural effusions. She was admitted for AHRF. She has been on IVCS. DM consulted to assist with glycemic management.    Blood glucose pattern    Significant diabetes-related events over the past 24-72 hours  A1C   Fasting BG:  Pre-prandial:   Basal:   Bolus:   Correction:   Total daily insulin  dose in the last 24 hours:      Management Rationale Action Plan   Medication   Basal needs Using *** units/kg/D based on ***    Nutritional needs Covers carb load in meals    Corrective insulin  Using *** sensitivity based on ***    Overriding steroid effect Using *** units/kg/D based on ***    Lab []         Hemoglobin A1c  []  Serum fructosamine  []  GAD antibodies   Additional orders  Carb consistent diet (60g CHO/meal)       Diabetes Discharge Plan   Medication  Type *** Diabetes, controlled/uncontrolled as evidenced by A1C   Referral  []         Outpatient diabetes education   Additional orders       Past Medical History     Past Medical History:   Diagnosis Date    Anxiety     Depression     Gastrointestinal disorder     Heart failure (HCC)     Hyperlipidemia     Hypertension     Liver disease 2009    liver failure    Pneumonia        Hospital Course   Clinical progress has been uncomplicated    Diabetes History   Type Diabetes  Ambulatory BG management provided by:   Family History:    Lab Results   Component Value Date    LABA1C 8.2 (H) 04/24/2024    LABA1C 8.1 (H) 04/09/2024     Diabetes-related Medical History  Acute complications  {Diabetes Acute  Complications:75145}  Neurological complications  {Diabetes Neurological Complications:42972}  Microvascular disease  {Diabetes Microvascular Disease:42971}  Macrovascular disease  {DIAGNOSES; MACROVASCULAR COMPLICATIONS DIABETES:41736}  Other associated conditions     {Diabetes Other Associated Conditions:75146}    Diabetes Medication History  Diabetes drug class Antihyperglycemic Agent and Dosing Additional Comments   Biguanide      Sulfonylureas     DPP4 inhibitors     Thiazolidinediones     SGLT-2 inhibitors     GLP-1 RAs     Insulin        Diabetes self-management practices:   Eating pattern   []  Eating a carbohydrate-controlled meal plan  []  Breakfast    []  Lunch     []  Dinner       []  Snacks    []  Beverages    []  Dentition status   Physical activity pattern   []  Employing a physical activity program to control BG    Monitoring pattern   []  Testing BGs sufficiently to inform self-management adjustments  []  Breakfast    []  Lunch     []   Dinner     []  Bedtime    Taking medications pattern  []  Consistent administration  []  Affordable    Social determinants of health impacting diabetes self-management practices    {Social determinants diabetes practices:42973}    Overall evaluation:    []  Not achieving A1c target with drug therapy & self-care practices    Subjective   "I ***."     Objective   Physical exam  General Overweight *** female/female in no acute distress/ill-appearing***. Conversant and cooperative  Neuro  Alert, oriented   Vital Signs   Vitals:    05/07/24 1020   BP:    Pulse:    Resp: 21   Temp:    SpO2:      Skin  Warm and dry. No acanthosis noted along neckline. No lipohypertrophy or lipoatrophy noted at injection sites   Extremities No foot wounds    Diabetic foot exam:    Left Foot     Visual Exam:{Foot Inspection:37963::normal}   Pulse DP: {Foot Pulse DP 2 (Optional):24344::Absent}   Filament test: {EXAM NEUROFILAMENT TEST:37965::normal sensation}   {Vibratory sensation:42230::normal}  Right  Foot   Visual Exam: {Foot Inspection:37963::normal}   Pulse DP: {Foot Pulse DP 2 (Optional):24344::Absent}   Filament test: {EXAM NEUROFILAMENT TEST:37965::normal sensation}   {Vibratory sensation:42230::normal}    Laboratory  Recent Labs     05/05/24  0606   WBC 8.3   HGB 11.2*   HCT 36.7   MCV 87.0   PLT 136*     Recent Labs     05/05/24  0606 05/06/24  0319 05/07/24  0547   NA 144 144 141   K 3.3* 4.4 4.0   CL 97* 101 102   CO2 36* 33* 29   PHOS 3.1 2.6 2.8   BUN 35* 37* 33*   CREATININE 0.92 1.10* 0.91     Lab Results   Component Value Date    ALT 12 05/02/2024    AST 39 (H) 05/02/2024    ALKPHOS 97 05/02/2024    BILITOT 0.6 05/02/2024     Lab Results   Component Value Date    TSH 3.57 03/19/2022         Factors impacting BG management  Factor Dose Comments   Nutrition:  Standard meals   60 grams/meal    Drugs:  Vasopressor load  Steroids  Epogen  Blood transfusion(s)  Atypical antipsychotics      Affects insulin  delivery  Impairs insulin  action  A1cs inaccurate  A1cs inaccurate  Weight gain increases insulin  resistance   Pain     Infection     Kidney function     Liver function       Billing Code(s)   []  99221 IP initial hospital care - 40 minutes   []  99222 IP initial hospital care -55 minutes  []  99223 IP initial hospital care -75 minutes  []         99233 IP subsequent hospital care - 50 minutes   []  99232 IP subsequent hospital care - 35 minutes   []  99231 IP subsequent hospital care - 25 minutes       Before making these care recommendations, I personally reviewed the hospitalization record, including notes, laboratory & diagnostic data and current medications, and examined the patient at the bedside.  Total minutes: ***    Aeron Donaghey A Tommie Dejoseph, APRN - CNS   Diabetes Clinical Nurse Specialist   [facility]  Access via [facility]

## 2024-05-08 ENCOUNTER — Inpatient Hospital Stay: Admit: 2024-05-08 | Payer: Medicare (Managed Care) | Primary: Diagnostic Radiology

## 2024-05-08 LAB — POCT GLUCOSE
POC Glucose: 387 mg/dL — ABNORMAL HIGH (ref 65–117)
POC Glucose: 186 mg/dL — ABNORMAL HIGH (ref 65–117)
POC Glucose: 228 mg/dL — ABNORMAL HIGH (ref 65–117)
POC Glucose: 214 mg/dL — ABNORMAL HIGH (ref 65–117)
POC Glucose: 203 mg/dL — ABNORMAL HIGH (ref 65–117)
POC Glucose: 93 mg/dL (ref 65–117)
POC Glucose: 174 mg/dL — ABNORMAL HIGH (ref 65–117)

## 2024-05-08 LAB — D-DIMER, QUANTITATIVE: D-Dimer, Quant: 0.26 mg{FEU}/L (ref 0.00–0.65)

## 2024-05-08 MED ORDER — DEXTROSE 10 % IV BOLUS
INTRAVENOUS | Status: DC | PRN
Start: 2024-05-08 — End: 2024-05-09

## 2024-05-08 MED ORDER — DEXTROSE 10 % IV SOLN
10 | INTRAVENOUS | Status: DC | PRN
Start: 2024-05-08 — End: 2024-05-09

## 2024-05-08 MED ORDER — INSULIN LISPRO 100 UNIT/ML IJ SOLN
100 | Freq: Three times a day (TID) | INTRAMUSCULAR | Status: DC
Start: 2024-05-08 — End: 2024-05-09
  Administered 2024-05-08 – 2024-05-09 (×5): 10 [IU]/kg via SUBCUTANEOUS

## 2024-05-08 MED ORDER — INSULIN NPH (HUMAN) (ISOPHANE) 100 UNIT/ML SC SUSP
100 | Freq: Once | SUBCUTANEOUS | Status: AC
Start: 2024-05-08 — End: 2024-05-08
  Administered 2024-05-08: 13:00:00 10 [IU] via SUBCUTANEOUS

## 2024-05-08 MED ORDER — GLUCOSE 4 G PO CHEW
4 | ORAL | Status: DC | PRN
Start: 2024-05-08 — End: 2024-05-09

## 2024-05-08 MED ORDER — INSULIN NPH (HUMAN) (ISOPHANE) 100 UNIT/ML SC SUSP
100 | Freq: Two times a day (BID) | SUBCUTANEOUS | Status: DC
Start: 2024-05-08 — End: 2024-05-09
  Administered 2024-05-08 – 2024-05-09 (×3): 22 [IU] via SUBCUTANEOUS

## 2024-05-08 MED FILL — INSULIN LISPRO 100 UNIT/ML IJ SOLN: 100 [IU]/mL | INTRAMUSCULAR | Qty: 10 | Fill #0

## 2024-05-08 MED FILL — BUMETANIDE 1 MG PO TABS: 1 mg | ORAL | Qty: 1 | Fill #0

## 2024-05-08 MED FILL — BUDESONIDE 0.5 MG/2ML IN SUSP: 0.5 MG/2ML | RESPIRATORY_TRACT | Qty: 4 | Fill #0

## 2024-05-08 MED FILL — BUDESONIDE 0.5 MG/2ML IN SUSP: 0.5 MG/2ML | RESPIRATORY_TRACT | Qty: 2 | Fill #0

## 2024-05-08 MED FILL — HUMULIN N 100 UNIT/ML SC SUSP: 100 [IU]/mL | SUBCUTANEOUS | Qty: 10 | Fill #0

## 2024-05-08 MED FILL — METHYLPREDNISOLONE SODIUM SUCC 40 MG IJ SOLR: 40 mg | INTRAMUSCULAR | Qty: 40 | Fill #0

## 2024-05-08 MED FILL — DEXTROSE 10 % IV SOLN: 10 % | INTRAVENOUS | Qty: 500 | Fill #0

## 2024-05-08 MED FILL — HUMULIN N 100 UNIT/ML SC SUSP: 100 [IU]/mL | SUBCUTANEOUS | Qty: 22 | Fill #0

## 2024-05-08 MED FILL — LANTUS 100 UNIT/ML SC SOLN: 100 [IU]/mL | SUBCUTANEOUS | Qty: 20 | Fill #0

## 2024-05-08 NOTE — Consults (Signed)
 [facility]  [facility]  DIABETES MANAGEMENT CONSULT    Consulted by Provider for advanced nursing evaluation and care for inpatient blood glucose management.    Evaluation and Action Plan   Breanna Lester is a 63 y.o. female with PMHx of T2DM, COPD on home O2, HTN, Afib, cirrhosis, who presented to ED on 9/12 with worsening SOB. She had recently hospitalized with PNA. Labs on admission noted for BG 143, A1c 8.2%, NT pro-BNP 3,447, Troponin T 14.8. CT of chest showing moderate right and small left-sided pleural effusion with bibasilar atelectasis. She was admitted for AHRF. DM consulted to assist with glycemic management in setting of IVCS.     Patient states she has never been diagnosed with DM before but does have a family history. With her recent hospitalization, she was on steroids with likely further exacerbated high BG. She was sent home on Metformin  but this made her sick (would not recommend this with liver cirrhosis anyway). She does check her BG at home and reports it ranges 100-140. Did not get into diet history since patient getting easily SOB while talking - will defer until tomorrow. Informed patient of her A1c of 8.2%, indicating Type 2 DM and advised her to continue monitoring BG at home. May need insulin  at some point.     BG steadily rose with start of IVCS but not excessively. She was started on basal insulin  yesterday. FBG 186 this am. Will adjust insulin  dosing to cover both her diabetes and steroid effects. Eating well - bolus doses added also. See below.     Blood glucose pattern    Significant diabetes-related events over the past 24-72 hours  A1C 8.2%  Fasting BG: 186  Pre-prandial: 240-242  Basal: Lantus  20 units yesterday  Bolus: 0  Correction: 14  Total daily insulin  dose in the last 24 hours: 34 units       Management Rationale Action Plan   Medication   Basal needs Using 0.2 units/kg/D based on weight and BG trends = 25 units (divided by 2) to cover diabetes  PLUS  steroid coverage of 20 units per dose   NPH 22 units every 12 hours   Nutritional needs Covers carb load in meals Humalog  10 units with meals   Corrective insulin  Using medium dose sensitivity based on weight     Overriding steroid effect Using 0.15 units/kg per dose    Lab []         Hemoglobin A1c  []  Serum fructosamine  []  GAD antibodies   Additional orders  Carb consistent diet (60g CHO/meal)       Diabetes Discharge Plan   Medication  Type 2 Diabetes - likely exacerbated by steroids  Stop Metformin  due to side effects and liver cirrhosis  Since liver cirrhosis, insulin  would be best option, however, she may by able to just do diet control for now   Referral  []         Outpatient diabetes education   Additional orders       Past Medical History     Past Medical History:   Diagnosis Date    Anxiety     Depression     Gastrointestinal disorder     Heart failure (HCC)     Hyperlipidemia     Hypertension     Liver disease 2009    liver failure    Pneumonia        Hospital Course   Clinical progress has been complicated  by AHRF    Diabetes History   Type Diabetes: Type 2  Ambulatory BG management provided by:   Family History:    Lab Results   Component Value Date    LABA1C 8.2 (H) 04/24/2024    LABA1C 8.1 (H) 04/09/2024     Diabetes-related Medical History  Acute complications  Steroid Induced Hyperglycemia  Other associated conditions     Obesity and Liver Disease    Diabetes Medication History  Diabetes drug class Antihyperglycemic Agent and Dosing Additional Comments   Biguanide  Metformin   Made me sick     Diabetes self-management practices: deferred for now since patient SOB  Eating pattern - deferred    []  Eating a carbohydrate-controlled meal plan  []  Breakfast    []  Lunch     []  Dinner       []  Snacks    []  Beverages    []  Dentition status   Physical activity pattern   []  Employing a physical activity program to control BG    Monitoring pattern   [x]  Testing BGs sufficiently to inform self-management  adjustments  Usually bw 100-140        Social determinants of health impacting diabetes self-management practices    Concerned that you need to know more about how to stay healthy with diabetes    Overall evaluation:    [x]  Not achieving A1c target with drug therapy & self-care practices    Subjective   "I'm just frustrated."     Objective   Physical exam  General    female with obesity, gets visibly winded while talking. Conversant and cooperative  Neuro  Alert, oriented   Vital Signs   Vitals:    05/08/24 0815   BP: (!) 141/84   Pulse: 65   Resp: 16   Temp: 97.5 F (36.4 C)   SpO2:        Laboratory  No results for input(s): WBC, HGB, HCT, MCV, PLT in the last 72 hours.  Recent Labs     05/06/24  0319 05/07/24  0547   NA 144 141   K 4.4 4.0   CL 101 102   CO2 33* 29   PHOS 2.6 2.8   BUN 37* 33*   CREATININE 1.10* 0.91     Lab Results   Component Value Date    ALT 12 05/02/2024    AST 39 (H) 05/02/2024    ALKPHOS 97 05/02/2024    BILITOT 0.6 05/02/2024     Lab Results   Component Value Date    TSH 3.57 03/19/2022         Factors impacting BG management  Factor Dose Comments   Nutrition:  Standard meals   60 grams/meal    Drugs:  Steroids     Solumedrol 40 mg q12h   Impairs insulin  action     Pain PRN    Infection Doxycycline      Kidney function Normal     Liver function Liver cirrhosis        Before making these care recommendations, I personally reviewed the hospitalization record, including notes, laboratory & diagnostic data and current medications, and examined the patient at the bedside.  Total minutes: 40    Beonka Amesquita A Elivia Robotham, APRN - CNS   Diabetes Clinical Nurse Specialist   [facility]  Access via [facility]

## 2024-05-08 NOTE — Care Coordination-Inpatient (Signed)
 05/08/24 1250   Readmission Assessment   Number of Days since last admission? 8-30 days   Previous Disposition Home with Family   Who is being Interviewed Patient   What was the patient's/caregiver's perception as to why they think they needed to return back to the hospital? Other (Comment)  (shortness of breath)   Did you visit your Primary Care Physician after you left the hospital, before you returned this time? No   Why weren't you able to visit your PCP? Other (Comment)  (was admitted before scheduled appointment)   Did you see a specialist, such as Cardiac, Pulmonary, Orthopedic Physician, etc. after you left the hospital? No   Who advised the patient to return to the hospital? Self-referral   Does the patient report anything that got in the way of taking their medications? No   In our efforts to provide the best possible care to you and others like you, can you think of anything that we could have done to help you after you left the hospital the first time, so that you might not have needed to return so soon? Other (Comment)  (none)         CM spoke with Pt. CM completed readmission assessment. Pt plans to return to [facility] at discharge. CM sent rollator orders to [facility] for review. Pt was also agreeable to home health. Pt stated that she recently moved to the area, so she has not seen her new PCP yet. Pt stated that she was scheduled to see her PCP on Monday, but was admitted to [facility].     CM notified Pt that there may be difficulties with arranging home health due to Pt not being established with PCP. Pt was encouraged to request PCP office to arrange home health after visit if home health cannot be arranged during this admission. Pt presented understanding.     Referral sent to several [facility] agencies for review.     [facility] [address] in [place]  [address] [place], [place], [place] [address]       [place], [place], [facility]  [facility] Care Manager  [phone]

## 2024-05-08 NOTE — Progress Notes (Signed)
 Name: Breanna Lester Lionel: [facility]   DOB: [birth year] Admit Date: 05/02/2024   Phone: [phone]  Room: 511/01   PCP: Unknown, Provider, NP-C  MRN: [id number]   Date: 05/08/2024  Code: Full Code        HPI:      Chart and notes reviewed. Data reviewed. I review the patient's current medications in the medical record at each encounter.  I have evaluated and examined the patient.     12:04 PM       History was obtained from patient.     I was asked by Consepcion LOISE Coombs, MD to see Breanna Lester in consultation for a chief complaint of COPD exacerbation, pleural effusion.    History of Present Illness:  Ms.Tregre is a very pleasant, 63 yo lady with a PMH of anxiety/depression, hypertension, hyperlipidemia, COPD (no PFTs to review), obesity, DM, a-fib that presented to [facility] with worsening shortness of breath.  She was recently hospitalized from 9/3-95 with acute GI bleed and 8/28-8/28 with sepsis thought to be due to pneumonia. She was treated while hospitalized with CTX and discharged home on CTX/Azithromycin .   She reports that she continued to feel short of breath and has been using a rescue inhaler at least 6-7 times per day. She does not have a nebulizer at home. She was sent home with oxygen after her recent discharge and is wearing 2L.  She does not use a maintenance inhaler at home. She has never been tested for OSA.   She smoked 1ppd for about 15 years and quit 15 years ago.  Her mother had COPD.  She saw a pulmonologist once in [place], but never had PFTs.    Images:    Chest CT 8/26: new patchy right upper lobe asd    Chest CTA 9/14: no PE.  No aortic aneurysm. Moderate right and small left sided pleural effusion with bibasilar atx    WBC 8.3  Pro-BNP 1427--3,447 on admission     Interval History:  Afebrile  HR 60s  BP stable/elevated  Sats 97% on 3L NC  TTE: EF 70-75%, moderate pericardial effusion 1-2 cm, posterior pericardial effusion measuring 1.0cm, RV moderately dilated, normal  systolic function, RA mildly dilated  No documentation of I/O      ROS:  Does not feel well today.  Still very SOB.     Past Medical History:   Diagnosis Date    Anxiety     Depression     Gastrointestinal disorder     Heart failure (HCC)     Hyperlipidemia     Hypertension     Liver disease 2009    liver failure    Pneumonia        Past Surgical History:   Procedure Laterality Date    ADENOIDECTOMY  1972    BACK SURGERY      cement discs following a fall    CATARACT REMOVAL      CESAREAN SECTION  1986 and 1993    DILATION AND CURETTAGE OF UTERUS  1999    EGD TRANSORAL BIOPSY SINGLE/MULTIPLE  05/17/2009         HYSTERECTOMY (CERVIX STATUS UNKNOWN)  2008    TONSILLECTOMY  1972    UPPER GASTROINTESTINAL ENDOSCOPY N/A 04/17/2024    ESOPHAGOGASTRODUODENOSCOPY performed by Madlyn Fendt, MD at [facility] ENDOSCOPY       Family History   Problem Relation Age of Onset    Cancer Mother  Heart Disease Father        Social History     Tobacco Use    Smoking status: Former     Current packs/day: 0.00     Types: Cigarettes     Quit date: 02/28/2008     Years since quitting: 16.2    Smokeless tobacco: Never   Substance Use Topics    Alcohol use: Never       Allergies   Allergen Reactions    Latex Rash       Current Facility-Administered Medications   Medication Dose Route Frequency    glucose chewable tablet 16 g  4 tablet Oral PRN    dextrose  bolus 10% 125 mL  125 mL IntraVENous PRN    Or    dextrose  bolus 10% 250 mL  250 mL IntraVENous PRN    dextrose  10 % infusion   IntraVENous Continuous PRN    insulin  NPH (HumuLIN  N;NovoLIN  N) injection vial 22 Units  22 Units SubCUTAneous Q12H    insulin  lispro (HUMALOG ,ADMELOG ) injection vial 10 Units  0.08 Units/kg SubCUTAneous TID WC    bumetanide  (BUMEX ) tablet 1 mg  1 mg Oral Daily    methylPREDNISolone  sodium succ (SOLU-MEDROL ) 40 mg in sterile water  1 mL injection  40 mg IntraVENous Q12H    budesonide  (PULMICORT ) nebulizer suspension 1,000 mcg  1 mg Nebulization BID RT    traMADol  (ULTRAM )  tablet 50 mg  50 mg Oral Q6H PRN    cyclobenzaprine  (FLEXERIL ) tablet 10 mg  10 mg Oral TID PRN    doxycycline  hyclate (VIBRA -TABS) tablet 100 mg  100 mg Oral 2 times per day    ipratropium 0.5 mg-albuterol  2.5 mg (DUONEB ) nebulizer solution 1 Dose  1 Dose Inhalation Q6H WA RT    guaiFENesin  (MUCINEX ) extended release tablet 600 mg  600 mg Oral BID    lidocaine  4 % external patch 1 patch  1 patch TransDERmal Daily PRN    ipratropium 0.5 mg-albuterol  2.5 mg (DUONEB ) nebulizer solution 1 Dose  1 Dose Inhalation Q20 Min PRN    arformoterol  tartrate (BROVANA ) nebulizer solution 15 mcg  15 mcg Nebulization BID RT    benzonatate  (TESSALON ) capsule 100 mg  100 mg Oral TID PRN    sodium chloride  flush 0.9 % injection 5-40 mL  5-40 mL IntraVENous 2 times per day    sodium chloride  flush 0.9 % injection 5-40 mL  5-40 mL IntraVENous PRN    0.9 % sodium chloride  infusion   IntraVENous PRN    ondansetron  (ZOFRAN -ODT) disintegrating tablet 4 mg  4 mg Oral Q8H PRN    Or    ondansetron  (ZOFRAN ) injection 4 mg  4 mg IntraVENous Q6H PRN    polyethylene glycol (GLYCOLAX ) packet 17 g  17 g Oral Daily PRN    acetaminophen  (TYLENOL ) tablet 650 mg  650 mg Oral Q6H PRN    Or    acetaminophen  (TYLENOL ) suppository 650 mg  650 mg Rectal Q6H PRN    albuterol  (PROVENTIL ) (2.5 MG/3ML) 0.083% nebulizer solution 1.25 mg  1.25 mg Nebulization Q2H PRN    pantoprazole  (PROTONIX ) tablet 40 mg  40 mg Oral BID AC    sertraline  (ZOLOFT ) tablet 100 mg  100 mg Oral Daily    escitalopram  (LEXAPRO ) tablet 5 mg  5 mg Oral Daily    HYDROmorphone  (DILAUDID ) tablet 1 mg  1 mg Oral Q4H PRN    HYDROmorphone  (DILAUDID ) tablet 2 mg  2 mg Oral Q4H PRN    lactobacillus (  CULTURELLE) capsule 1 capsule  1 capsule Oral Daily with breakfast    glucose chewable tablet 16 g  4 tablet Oral PRN    dextrose  bolus 10% 125 mL  125 mL IntraVENous PRN    Or    dextrose  bolus 10% 250 mL  250 mL IntraVENous PRN    glucagon  injection 1 mg  1 mg SubCUTAneous PRN    dextrose  10 %  infusion   IntraVENous Continuous PRN    insulin  lispro (HUMALOG ,ADMELOG ) injection vial 0-8 Units  0-8 Units SubCUTAneous 4x Daily AC & HS    apixaban  (ELIQUIS ) tablet 5 mg  5 mg Oral BID    [Held by provider] metoprolol  tartrate (LOPRESSOR ) tablet 25 mg  25 mg Oral BID         REVIEW OF SYSTEMS   Negative except as stated in the HPI.      Physical Exam:   Vitals:    05/08/24 0906   BP:    Pulse: 64   Resp: 16   Temp:    SpO2: 96%       General:  Alert, cooperative, no distress, appears stated age.   Head:  Normocephalic, without obvious abnormality, atraumatic.   Eyes:  Conjunctivae/corneas clear.    Nose: Nares normal. Septum midline. Mucosa normal.    Throat: Lips, mucosa, and tongue normal. Teeth and gums normal.   Neck: Supple, symmetrical, trachea midline, no adenopathy   Lungs:   Exp wheeze, more diminished today   Chest wall:  No tenderness or deformity.   Heart:  Regular rate and rhythm, S1, S2 normal, no murmur, click, rub or gallop.   Abdomen:   Soft, non-tender. Bowel sounds normal.    Extremities: Extremities normal, atraumatic, no cyanosis or edema.   Pulses: 2+ and symmetric all extremities.   Skin: Skin color, texture, turgor normal   Neurologic: Grossly nonfocal       Lab Results   Component Value Date/Time    NA 141 05/07/2024 05:47 AM    K 4.0 05/07/2024 05:47 AM    CL 102 05/07/2024 05:47 AM    CO2 29 05/07/2024 05:47 AM    BUN 33 05/07/2024 05:47 AM    MG 2.3 05/07/2024 05:47 AM    PHOS 2.8 05/07/2024 05:47 AM       Lab Results   Component Value Date/Time    WBC 8.3 05/05/2024 06:06 AM    HGB 11.2 05/05/2024 06:06 AM    PLT 136 05/05/2024 06:06 AM    MCV 87.0 05/05/2024 06:06 AM       Lab Results   Component Value Date/Time    INR 1.3 05/03/2024 11:21 AM    GLOB 3.0 05/02/2024 02:44 PM       Lab Results   Component Value Date/Time    IRON 40 04/24/2024 04:59 AM    TIBC 292 04/24/2024 04:59 AM       Lab Results   Component Value Date/Time    CRP 0.6 05/07/2024 05:47 AM    ANA Negative 04/15/2024  03:15 PM    TSH 3.57 03/19/2022 12:25 PM        @BRIEFLAB24 (ph,phi,pco2,pco2i,po2,po2i,hco3,hco3i,fio2,fio2i)@    No results found for: CPK, BNP     No components found for: CULT    No results found for: CMV, RPR, HAAB, HCAB1, G6PD, HIVR, HIV1, HIV12, HIVPC, HIVRPI    No results found for: CPK    No results found for: COLOR, CASTS    IMPRESSION  Acute on  Chronic Hypoxic Respiratory Failure, initially required 4L on admission  Abnormal Chest CT: bilateral pleural effusions, cardiomegaly, and coronary vascular calcifications concerning for decompensated heart failure  Pericardial Effusion  Wheezing/Suspected COPD  Former Tobacco Use  Obesity    PLAN  Supplemental oxygen to keep sats >90%, currently on baseline 2L  Continue Bumex  as per primary team. Check TTE  Brovana /Pulmicort  (increase dose), scheduled Duo-nebs. Needs to go home on a maintenance inhaler such as Trelegy or Breztri.  Continue IV steroids for now, will not wean today  Cardiology has evaluated and signed off  Check CXR  Check d-dimer  Continue PO Bumex   Continue Doxycycline  for 5 days total  Arrange nebulizer machine at discharge  Encourage IS use  OOB  Eliquis         Margarie Mardella Nuckles, APRN - NP

## 2024-05-08 NOTE — Plan of Care (Signed)
 Problem: Chronic Conditions and Co-morbidities  Goal: Patient's chronic conditions and co-morbidity symptoms are monitored and maintained or improved  Outcome: Progressing     Problem: Safety - Adult  Goal: Free from fall injury  Outcome: Progressing     Problem: Pain  Goal: Verbalizes/displays adequate comfort level or baseline comfort level  Outcome: Progressing     Problem: Respiratory - Adult  Goal: Achieves optimal ventilation and oxygenation  Outcome: Progressing     Problem: Skin/Tissue Integrity  Goal: Skin integrity remains intact  Description: 1.  Monitor for areas of redness and/or skin breakdown  2.  Assess vascular access sites hourly  3.  Every 4-6 hours minimum:  Change oxygen saturation probe site  4.  Every 4-6 hours:  If on nasal continuous positive airway pressure, respiratory therapy assess nares and determine need for appliance change or resting period  Outcome: Progressing  Flowsheets (Taken 05/07/2024 2000)  Skin Integrity Remains Intact: Monitor for areas of redness and/or skin breakdown     Problem: Discharge Planning  Goal: Discharge to home or other facility with appropriate resources  Outcome: Progressing     Problem: Cardiovascular - Adult  Goal: Maintains optimal cardiac output and hemodynamic stability  Outcome: Progressing     Problem: Skin/Tissue Integrity - Adult  Goal: Skin integrity remains intact  Description: 1.  Monitor for areas of redness and/or skin breakdown  2.  Assess vascular access sites hourly  3.  Every 4-6 hours minimum:  Change oxygen saturation probe site  4.  Every 4-6 hours:  If on nasal continuous positive airway pressure, respiratory therapy assess nares and determine need for appliance change or resting period  Outcome: Progressing  Flowsheets (Taken 05/07/2024 2000)  Skin Integrity Remains Intact: Monitor for areas of redness and/or skin breakdown     Problem: Musculoskeletal - Adult  Goal: Return mobility to safest level of function  Outcome:  Progressing  Goal: Return ADL status to a safe level of function  Outcome: Progressing     Problem: Infection - Adult  Goal: Absence of infection during hospitalization  Outcome: Progressing     Problem: Metabolic/Fluid and Electrolytes - Adult  Goal: Electrolytes maintained within normal limits  Outcome: Progressing  Goal: Glucose maintained within prescribed range  Outcome: Progressing     Problem: Physical Therapy - Adult  Goal: By Discharge: Performs mobility at highest level of function for planned discharge setting.  See evaluation for individualized goals.  Description: FUNCTIONAL STATUS PRIOR TO ADMISSION: Patient was independent and active without use of DME.    HOME SUPPORT PRIOR TO ADMISSION: The patient lived with daughter but did not require assistance. Lives with duaghter that is high level autistic that works part time. Both live together in Extended stay motel    Physical Therapy Goals  Initiated 05/03/2024  1.  Patient will move from supine to sit and sit to supine in bed with modified independence within 7 day(s).    2.  Patient will perform sit to stand with modified independence within 7 day(s).  3.  Patient will transfer from bed to chair and chair to bed with modified independence using the least restrictive device within 7 day(s).  4.  Patient will ambulate with modified independence for 150 feet with the least restrictive device within 7 day(s).       05/07/2024 1406 by Rayleen Search, PT  Outcome: Progressing     Problem: Occupational Therapy - Adult  Goal: By Discharge: Performs self-care activities at highest level  of function for planned discharge setting.  See evaluation for individualized goals.  Description: FUNCTIONAL STATUS PRIOR TO ADMISSION: The patient was independent for ADLs and functional mobility apart from min A for tub transfers and occasional assistance for LB dressing.  HOME SUPPORT: The patient lives with her daughter who works part time outside of the home. They are living  in extended stay hotel.    Occupational Therapy Goals:  Initiated 05/04/2024  1.  Patient will perform grooming with Modified Independence in sitting within 7 day(s).  2.  Patient will perform upper body dressing with Supervision within 7 day(s).  3.  Patient will perform toilet transfers with Supervision  within 7 day(s).  4.  Patient will perform all aspects of toileting with Supervision within 7 day(s).  5.  Patient will participate in upper extremity therapeutic exercise/activities with Supervision for 5 minutes within 7 day(s).    6.  Patient will utilize energy conservation techniques during functional activities with verbal cues within 7 day(s).   05/07/2024 1355 by Howard Cough, OT  Outcome: Progressing

## 2024-05-08 NOTE — Plan of Care (Signed)
 Problem: Physical Therapy - Adult  Goal: By Discharge: Performs mobility at highest level of function for planned discharge setting.  See evaluation for individualized goals.  Description: FUNCTIONAL STATUS PRIOR TO ADMISSION: Patient was independent and active without use of DME.    HOME SUPPORT PRIOR TO ADMISSION: The patient lived with daughter but did not require assistance. Lives with duaghter that is high level autistic that works part time. Both live together in Extended stay motel    Physical Therapy Goals  Initiated 05/03/2024  1.  Patient will move from supine to sit and sit to supine in bed with modified independence within 7 day(s).    2.  Patient will perform sit to stand with modified independence within 7 day(s).  3.  Patient will transfer from bed to chair and chair to bed with modified independence using the least restrictive device within 7 day(s).  4.  Patient will ambulate with modified independence for 150 feet with the least restrictive device within 7 day(s).       Outcome: Progressing   PHYSICAL THERAPY TREATMENT    Patient: Breanna Lester (63 y.o. female)  Date: 05/08/2024  Diagnosis: COPD exacerbation (HCC) [J44.1]  New onset of congestive heart failure (HCC) [I50.9]  Acute hypoxic respiratory failure (HCC) [J96.01] Acute hypoxic respiratory failure (HCC)      Precautions: Restrictions/Precautions  Restrictions/Precautions: Fall Risk            ASSESSMENT:  Patient continues to benefit from skilled PT services and is progressing towards goals. Patient limited mainly by SOB but is otherwise moving well.  Currently is modI for bed mobility and SBA for transfers. Able to amb approx 10 ft x 2 with no AD with stable gait and no overt LOB.  She is audibly wheezing with activity and SpO2 drops to 88% on 3 L O2 with activity.  Recovers with rest.  Educated extensively on energy conservation and pursed lip breathing. Anticipate she would benefit from a rollator for home especially for distance  ambulation.         PLAN:  Patient continues to benefit from skilled intervention to address the above impairments.  Continue treatment per established plan of care.        Recommendation for discharge: (in order for the patient to meet his/her long term goals):   Intermittent physical therapy up to 2-3x/week in previous living setting    Other factors to consider for discharge: no additional factors    IF patient discharges home will need the following DME: rollator    Breanna Lester was evaluated today and a DME order was entered for a wheeled walker with seat because she requires this to successfully complete daily living tasks of ambulating.  A wheeled walker with seat is necessary due to the patient's unsteady gait, upper body weakness, inability to pick up and ambulation device, ambulating only short distances by pushing a walker, and the need to sit for a short time before resuming ambulation.  These tasks cannot be completed with a lesser ambulation device such as a cane, crutch, or standard walker.  The need for this equipment was discussed with the patient and she understands and is in agreement.           SUBJECTIVE:   Patient stated, I would really like to have something I could sit and rest on.    OBJECTIVE DATA SUMMARY:   Critical Behavior:  Orientation  Orientation Level: Oriented X4  Functional Mobility Training:  Bed Mobility:  Bed Mobility Training  Rolling: Modified independent  Supine to Sit: Modified independent  Scooting: Modified independent  Transfers:  Transfer Training  Sit to Stand: Stand by assistance  Stand to Sit: Stand by assistance  Balance:  Balance  Sitting: Intact  Standing: Intact  Standing - Static: Good  Standing - Dynamic: Good   Ambulation/Gait Training:     Gait  Overall Level of Assistance: Stand by assistance  Distance (ft): 10 Feet (x 2)  Assistive Device: Gait belt;Walker, rolling  Base of Support: Widened  Speed/Cadence: Pace decreased (< 100  feet/min);Shuffled;Slow  Step Length: Left shortened;Right shortened  Gait Abnormalities: Decreased step clearance;Shuffling gait        Neuro Re-Education:                    Pain Rating:  0/10   Pain Intervention(s):       Activity Tolerance:   Fair  and requires rest breaks    After treatment:   Call bell within reach and Bed/ chair alarm activated, left up in bedside chair      COMMUNICATION/EDUCATION:   The patient's plan of care was discussed with: physical therapist, occupational therapist, and registered nurse           Breanna Lester, PT, DPT  Minutes: 20

## 2024-05-08 NOTE — Progress Notes (Signed)
 [facility]  [address] [place], [place] [address]  [phone]        Hospitalist Progress Note      NAME: Breanna Lester   DOB:  Oct 09, 1960  MRM:  [id number]    Date/Time of service: 05/08/2024  12:24 PM       Subjective:     Chief Complaint:  Patient was personally seen and examined by me during this time period.  Chart reviewed.  No fevers, chills, chest pain.  Wheezing about the same       Objective:       Vitals:       Last 24hrs VS reviewed since prior progress note. Most recent are:    Vitals:    05/08/24 0906   BP:    Pulse: 64   Resp: 16   Temp:    SpO2: 96%     SpO2 Readings from Last 6 Encounters:   05/08/24 96%   04/25/24 96%   04/17/24 97%   04/11/24 95%   02/26/24 94%   02/18/24 92%          Intake/Output Summary (Last 24 hours) at 05/08/2024 1224  Last data filed at 05/08/2024 1204  Gross per 24 hour   Intake 800 ml   Output --   Net 800 ml          Exam:     Physical Exam:    Gen:  Well-developed, well-nourished, morbidly obese, NAD  HEENT:  Pink conjunctivae, PERRL, hearing intact to voice, moist mucous membranes  Neck:  Supple, without masses, thyroid non-tender  Resp:  + accessory muscle use, scattered wheezing, better air movement  Card:  No murmurs, normal S1, S2 without thrills, bruits or peripheral edema  Abd:  Soft, non-tender, non-distended, normoactive bowel sounds are present  Musc:  No cyanosis or clubbing  Skin:  No rashes   Neuro:  Cranial nerves 3-12 are grossly intact, follows commands appropriately  Psych:  Good insight, oriented to person, place and time, alert    Medications Reviewed: (see below)    Lab Data Reviewed: (see below)    ______________________________________________________________________    Medications:     Current Facility-Administered Medications   Medication Dose Route Frequency    glucose chewable tablet 16 g  4 tablet Oral PRN    dextrose  bolus 10% 125 mL  125 mL IntraVENous PRN    Or    dextrose  bolus 10% 250 mL  250 mL  IntraVENous PRN    dextrose  10 % infusion   IntraVENous Continuous PRN    insulin  NPH (HumuLIN  N;NovoLIN  N) injection vial 22 Units  22 Units SubCUTAneous Q12H    insulin  lispro (HUMALOG ,ADMELOG ) injection vial 10 Units  0.08 Units/kg SubCUTAneous TID WC    bumetanide  (BUMEX ) tablet 1 mg  1 mg Oral Daily    methylPREDNISolone  sodium succ (SOLU-MEDROL ) 40 mg in sterile water  1 mL injection  40 mg IntraVENous Q12H    budesonide  (PULMICORT ) nebulizer suspension 1,000 mcg  1 mg Nebulization BID RT    traMADol  (ULTRAM ) tablet 50 mg  50 mg Oral Q6H PRN    cyclobenzaprine  (FLEXERIL ) tablet 10 mg  10 mg Oral TID PRN    doxycycline  hyclate (VIBRA -TABS) tablet 100 mg  100 mg Oral 2 times per day    ipratropium 0.5 mg-albuterol  2.5 mg (DUONEB ) nebulizer solution 1 Dose  1 Dose Inhalation Q6H WA RT    guaiFENesin  (MUCINEX ) extended release tablet 600  mg  600 mg Oral BID    lidocaine  4 % external patch 1 patch  1 patch TransDERmal Daily PRN    ipratropium 0.5 mg-albuterol  2.5 mg (DUONEB ) nebulizer solution 1 Dose  1 Dose Inhalation Q20 Min PRN    arformoterol  tartrate (BROVANA ) nebulizer solution 15 mcg  15 mcg Nebulization BID RT    benzonatate  (TESSALON ) capsule 100 mg  100 mg Oral TID PRN    sodium chloride  flush 0.9 % injection 5-40 mL  5-40 mL IntraVENous 2 times per day    sodium chloride  flush 0.9 % injection 5-40 mL  5-40 mL IntraVENous PRN    0.9 % sodium chloride  infusion   IntraVENous PRN    ondansetron  (ZOFRAN -ODT) disintegrating tablet 4 mg  4 mg Oral Q8H PRN    Or    ondansetron  (ZOFRAN ) injection 4 mg  4 mg IntraVENous Q6H PRN    polyethylene glycol (GLYCOLAX ) packet 17 g  17 g Oral Daily PRN    acetaminophen  (TYLENOL ) tablet 650 mg  650 mg Oral Q6H PRN    Or    acetaminophen  (TYLENOL ) suppository 650 mg  650 mg Rectal Q6H PRN    albuterol  (PROVENTIL ) (2.5 MG/3ML) 0.083% nebulizer solution 1.25 mg  1.25 mg Nebulization Q2H PRN    pantoprazole  (PROTONIX ) tablet 40 mg  40 mg Oral BID AC    sertraline  (ZOLOFT ) tablet  100 mg  100 mg Oral Daily    escitalopram  (LEXAPRO ) tablet 5 mg  5 mg Oral Daily    HYDROmorphone  (DILAUDID ) tablet 1 mg  1 mg Oral Q4H PRN    HYDROmorphone  (DILAUDID ) tablet 2 mg  2 mg Oral Q4H PRN    lactobacillus (CULTURELLE) capsule 1 capsule  1 capsule Oral Daily with breakfast    glucose chewable tablet 16 g  4 tablet Oral PRN    dextrose  bolus 10% 125 mL  125 mL IntraVENous PRN    Or    dextrose  bolus 10% 250 mL  250 mL IntraVENous PRN    glucagon  injection 1 mg  1 mg SubCUTAneous PRN    dextrose  10 % infusion   IntraVENous Continuous PRN    insulin  lispro (HUMALOG ,ADMELOG ) injection vial 0-8 Units  0-8 Units SubCUTAneous 4x Daily AC & HS    apixaban  (ELIQUIS ) tablet 5 mg  5 mg Oral BID    [Held by provider] metoprolol  tartrate (LOPRESSOR ) tablet 25 mg  25 mg Oral BID          Lab Review:     No results for input(s): WBC, HGB, HCT, PLT in the last 72 hours.    Recent Labs     05/06/24  0319 05/07/24  0547   NA 144 141   K 4.4 4.0   CL 101 102   CO2 33* 29   BUN 37* 33*   MG 2.3 2.3   PHOS 2.6 2.8     No results found for: GLUCPOC       Assessment / Plan:     63 yo Hx of HTN, DM, Pafib on Eliquis , COPD on 3L, pancreatitis, etoh cirrhosis, presented w/ COPD exacerbation     1) Acute on chronic resp failure/hypoxia: improved, back to baseline O2.  Due to COPD.  Chest CTA neg for PE.  Normally on 3L O2 at home.  Will use Diamox  prn to control met alkalosis    2) Acute COPD exacerbation: improving, but slow.  Unclear trigger.  Flu and COVID  negative.  Cont IV solumedrol (  wean as tolerated), duonebs, Doxy.  Pulm following    3) Bilateral pleural effusions: likely due to diastolic dysfunction from COPD.  Recent Echo w/ EF 70-75%. Will cont PO bumex      4) HTN/Pafib: cont metoprolol , eliquis     5) DM type 2: A1C 8.2%.  Hyperglycemia from steroids.  Will cont lantus .  cont SSI.  DM educator following    6) Chronic pain: cont flexeril     7) Etoh cirrhosis: synthetic function intact.  F/u outpatient     8)  Weakness: PT/OT recommended HH    **Prior records, notes, labs, radiology, and medications reviewed in [facility]**    Total time spent with patient care: 54 Minutes **I personally saw and examined the patient during this time period**                 Care Plan discussed with: Patient, nursing, CM    Discussed:  Care Plan    Prophylaxis:  eliquis     Disposition:  Lives in hotel, likely discharge 09/19 if improving           ___________________________________________________    Attending Physician: Rodrick Has, MD

## 2024-05-09 LAB — BASIC METABOLIC PANEL
Anion Gap: 9 mmol/L (ref 2–14)
BUN/Creatinine Ratio: 43 — ABNORMAL HIGH (ref 12–20)
BUN: 32 mg/dL — ABNORMAL HIGH (ref 8–23)
CO2: 30 mmol/L — ABNORMAL HIGH (ref 20–29)
Calcium: 8.5 mg/dL — ABNORMAL LOW (ref 8.8–10.2)
Chloride: 100 mmol/L (ref 98–107)
Creatinine: 0.75 mg/dL (ref 0.60–1.00)
Est, Glom Filt Rate: 90 ml/min/1.73m2 (ref >59)
Glucose: 188 mg/dL — ABNORMAL HIGH (ref 65–100)
Potassium: 3.5 mmol/L (ref 3.5–5.1)
Sodium: 139 mmol/L (ref 136–145)

## 2024-05-09 LAB — POCT GLUCOSE
POC Glucose: 123 mg/dL — ABNORMAL HIGH (ref 65–117)
POC Glucose: 158 mg/dL — ABNORMAL HIGH (ref 65–117)
POC Glucose: 171 mg/dL — ABNORMAL HIGH (ref 65–117)
POC Glucose: 191 mg/dL — ABNORMAL HIGH (ref 65–117)
POC Glucose: 248 mg/dL — ABNORMAL HIGH (ref 65–117)
POC Glucose: 256 mg/dL — ABNORMAL HIGH (ref 65–117)

## 2024-05-09 LAB — CBC
Hematocrit: 37.4 % (ref 35.0–47.0)
Hemoglobin: 11.7 g/dL (ref 11.5–16.0)
MCH: 26.1 pg (ref 26.0–34.0)
MCHC: 31.3 g/dL (ref 30.0–36.5)
MCV: 83.3 FL (ref 80.0–99.0)
MPV: 11.9 FL (ref 8.9–12.9)
Nucleated RBCs: 0 /100{WBCs}
Platelets: 125 K/uL — ABNORMAL LOW (ref 150–400)
RBC: 4.49 M/uL (ref 3.80–5.20)
RDW: 15 % — ABNORMAL HIGH (ref 11.5–14.5)
WBC: 6.4 K/uL (ref 3.6–11.0)
nRBC: 0 K/uL (ref 0.00–0.01)

## 2024-05-09 LAB — BRAIN NATRIURETIC PEPTIDE: NT Pro-BNP: 401 pg/mL — ABNORMAL HIGH (ref 0–125)

## 2024-05-09 LAB — PHOSPHORUS: Phosphorus: 3.1 mg/dL (ref 2.5–4.5)

## 2024-05-09 LAB — MAGNESIUM: Magnesium: 2.3 mg/dL (ref 1.6–2.4)

## 2024-05-09 MED ORDER — TRELEGY ELLIPTA 100-62.5-25 MCG/ACT IN AEPB
100-62.5-25 | Freq: Every day | RESPIRATORY_TRACT | 1 refills | 30.00000 days | Status: DC
Start: 2024-05-09 — End: 2024-06-12

## 2024-05-09 MED ORDER — ALBUTEROL SULFATE (2.5 MG/3ML) 0.083% IN NEBU
RESPIRATORY_TRACT | 3 refills | 25.00000 days | Status: DC | PRN
Start: 2024-05-09 — End: 2024-06-12
  Filled 2024-05-09: qty 360, 10d supply, fill #0

## 2024-05-09 MED ORDER — BENZONATATE 100 MG PO CAPS
100 | ORAL_CAPSULE | Freq: Three times a day (TID) | ORAL | 2 refills | 10.00000 days | Status: AC | PRN
Start: 2024-05-09 — End: 2024-05-24
  Filled 2024-05-09: qty 15, 5d supply, fill #0

## 2024-05-09 MED ORDER — SERTRALINE HCL 100 MG PO TABS
100 | ORAL_TABLET | Freq: Every day | ORAL | 3 refills | 90.00000 days | Status: DC
Start: 2024-05-09 — End: 2024-07-02
  Filled 2024-05-09: qty 30, 30d supply, fill #0

## 2024-05-09 MED ORDER — BUMETANIDE 1 MG PO TABS
1 | ORAL_TABLET | Freq: Every day | ORAL | 3 refills | 30.00000 days | Status: DC
Start: 2024-05-09 — End: 2024-06-12
  Filled 2024-05-09: qty 30, 30d supply, fill #0

## 2024-05-09 MED ORDER — PANTOPRAZOLE SODIUM 40 MG PO TBEC
40 | ORAL_TABLET | Freq: Two times a day (BID) | ORAL | 3 refills | 30.00000 days | Status: AC
Start: 2024-05-09 — End: ?

## 2024-05-09 MED ORDER — PREDNISONE 20 MG PO TABS
20 | ORAL_TABLET | ORAL | 0 refills | 8.50000 days | Status: AC
Start: 2024-05-09 — End: 2024-05-18
  Filled 2024-05-09: qty 14, 9d supply, fill #0

## 2024-05-09 MED ORDER — APIXABAN 5 MG PO TABS
5 | ORAL_TABLET | Freq: Two times a day (BID) | ORAL | 1 refills | 30.00000 days | Status: DC
Start: 2024-05-09 — End: 2024-08-06
  Filled 2024-05-09: qty 60, 30d supply, fill #0

## 2024-05-09 MED ORDER — CYCLOBENZAPRINE HCL 10 MG PO TABS
10 | ORAL_TABLET | Freq: Three times a day (TID) | ORAL | 1 refills | 10.00000 days | Status: AC | PRN
Start: 2024-05-09 — End: 2024-05-19
  Filled 2024-05-09: qty 15, 5d supply, fill #0

## 2024-05-09 MED ORDER — GUAIFENESIN ER 600 MG PO TB12
600 | ORAL_TABLET | Freq: Two times a day (BID) | ORAL | 1 refills | 15.00000 days | Status: AC
Start: 2024-05-09 — End: ?
  Filled 2024-05-09: qty 15, 7d supply, fill #0

## 2024-05-09 MED ORDER — ESCITALOPRAM OXALATE 5 MG PO TABS
5 | ORAL_TABLET | Freq: Every day | ORAL | 3 refills | 90.00000 days | Status: DC
Start: 2024-05-09 — End: 2024-06-12
  Filled 2024-05-09: qty 30, 30d supply, fill #0

## 2024-05-09 MED FILL — INSULIN LISPRO 100 UNIT/ML IJ SOLN: 100 [IU]/mL | INTRAMUSCULAR | Qty: 10 | Fill #0

## 2024-05-09 MED FILL — METHYLPREDNISOLONE SODIUM SUCC 40 MG IJ SOLR: 40 mg | INTRAMUSCULAR | Qty: 40 | Fill #0

## 2024-05-09 MED FILL — HUMULIN N 100 UNIT/ML SC SUSP: 100 [IU]/mL | SUBCUTANEOUS | Qty: 22 | Fill #0

## 2024-05-09 MED FILL — BUDESONIDE 0.5 MG/2ML IN SUSP: 0.5 MG/2ML | RESPIRATORY_TRACT | Qty: 2 | Fill #0

## 2024-05-09 MED FILL — BUMETANIDE 1 MG PO TABS: 1 mg | ORAL | Qty: 1 | Fill #0

## 2024-05-09 NOTE — Discharge Instructions (Addendum)
 HOSPITALIST DISCHARGE INSTRUCTIONS  NAME:  Breanna Lester   DOB:  15-Jan-1961   MRN:  [id number]     Date/Time:  05/09/2024 10:42 AM    ADMIT DATE: 05/02/2024     DISCHARGE DATE: 05/09/2024     DISCHARGE DIAGNOSIS:  COPD    DISCHARGE INSTRUCTIONS:  Thank you for allowing us  to participate in your care. Your discharging Hospitalist is Prentice Arnold, MD. [name] were admitted for evaluation and treatment of the above.     63 yo Hx of HTN, DM, Pafib on Eliquis , COPD on 3L, pancreatitis, etoh cirrhosis, presented w/ COPD exacerbation      1) Acute on chronic resp failure/hypoxia: improved, back to baseline O2.  Due to COPD.  Chest CTA neg for PE.  Normally on 3L O2 at home.    Will use Diamox  prn to control met alkalosis     2) Acute COPD exacerbation: improving, but slow.  Unclear trigger.  Flu and COVID  negative.  Cont IV solumedrol (wean as tolerated), duonebs, Doxy.  Pulm following  Back to baseline     3) Bilateral pleural effusions: likely due to diastolic dysfunction from COPD.  Recent Echo w/ EF 70-75%.   Will cont PO bumex       4) HTN/Pafib:   cont metoprolol , eliquis      5) DM type 2: A1C 8.2%.  Hyperglycemia from steroids.  Will cont lantus .  cont SSI.  DM educator following     6) Chronic pain: cont flexeril      7) Etoh cirrhosis: synthetic function intact.    F/u outpatient      8) Weakness: PT/OT recommended HH      MEDICATIONS:    It is important that you take the medication exactly as they are prescribed.   Keep your medication in the bottles provided by the pharmacist and keep a list of the medication names, dosages, and times to be taken in your wallet.   Do not take other medications without consulting your doctor.             If you experience any of the following symptoms then please call your primary care physician or return to the emergency room if you cannot get hold of your doctor:  Fever, chills, nausea, vomiting, diarrhea, change in mentation, falling, bleeding, shortness of breath    Follow  Up:  Please call the below provider to arrange hospital follow up appointment      Maughon, Estelle  N, PA-C  [address] [place]  [name] [address]  [phone]    Go on 05/20/2024  Hospital follow up with Pulmonary set for Tuesday September 30th 2025 at 2:45 PM with PA [place]  Maughon at [facility] [place] location.      For questions regarding your Hospitalization or to contact the Hospital Medicine team, please call [phone].      Information obtained by :  I understand that if any problems occur once I am at home I am to contact my physician.    I understand and acknowledge receipt of the instructions indicated above.  Physician's or R.N.'s Signature                                                                  Date/Time                                                                                                                                              Patient or Energy manager                                                          Date/Time

## 2024-05-09 NOTE — Plan of Care (Signed)
 Problem: Physical Therapy - Adult  Goal: By Discharge: Performs mobility at highest level of function for planned discharge setting.  See evaluation for individualized goals.  Description: FUNCTIONAL STATUS PRIOR TO ADMISSION: Patient was independent and active without use of DME.    HOME SUPPORT PRIOR TO ADMISSION: The patient lived with daughter but did not require assistance. Lives with duaghter that is high level autistic that works part time. Both live together in Extended stay motel    Physical Therapy Goals  Initiated 05/03/2024  1.  Patient will move from supine to sit and sit to supine in bed with modified independence within 7 day(s).    2.  Patient will perform sit to stand with modified independence within 7 day(s).  3.  Patient will transfer from bed to chair and chair to bed with modified independence using the least restrictive device within 7 day(s).  4.  Patient will ambulate with modified independence for 150 feet with the least restrictive device within 7 day(s).     Outcome: Progressing     PHYSICAL THERAPY TREATMENT    Patient: Breanna Lester (63 y.o. female)  Date: 05/09/2024  Diagnosis: COPD exacerbation (HCC) [J44.1]  New onset of congestive heart failure (HCC) [I50.9]  Acute hypoxic respiratory failure (HCC) [J96.01] Acute hypoxic respiratory failure (HCC)      Precautions: Restrictions/Precautions  Restrictions/Precautions: Fall Risk          ASSESSMENT:  Patient continues to benefit from skilled PT services and is progressing towards goals. Rollator delivered and adjusted for patient. Patient educated on use of rollator and able to practice sit<>stand transitions including locking brakes, seated rest break on rollator, and gait with rollator support with good understanding. Rollator appears to be of great help to patient to be able to progress gait distance and for energy conservation. Anticipate good continued progress towards goals if dc delayed.     PLAN:  Patient continues to benefit  from skilled intervention to address the above impairments.  Continue treatment per established plan of care.        Recommendation for discharge: (in order for the patient to meet his/her long term goals):   Intermittent physical therapy up to 2-3x/week in previous living setting    Other factors to consider for discharge: high risk for falls    IF patient discharges home will need the following DME: rollator has been delivered for discharge     SUBJECTIVE:   Patient stated, That is going to be a big help. [Re: rollator]    OBJECTIVE DATA SUMMARY:   Critical Behavior:  Orientation  Overall Orientation Status: Within Normal Limits  Cognition  Overall Cognitive Status: WNL    Functional Mobility Training:  Bed Mobility:  Bed Mobility Training  Bed Mobility Training: No  Transfers:  Transfer Training  Interventions: Verbal cues;Safety awareness training;Demonstration (for rollator use)  Sit to Stand: Stand by assistance  Stand to Sit: Stand by assistance  Balance:  Balance  Sitting: Intact  Standing: Intact;With support   Ambulation/Gait Training:     Gait  Overall Level of Assistance: Supervision  Distance (ft): 50 Feet  Assistive Device: Gait belt;Walker, rolling  Interventions: Verbal cues;Safety awareness training  Base of Support: Widened  Speed/Cadence: Pace decreased (< 100 feet/min);Shuffled;Slow  Step Length: Left shortened;Right shortened  Gait Abnormalities: Decreased step clearance                  Pain Rating:  No complaints    Activity Tolerance:  Fair  and desaturates with activity and requires oxygen    After treatment:   Patient left in no apparent distress sitting up in chair and Call bell within reach    COMMUNICATION/EDUCATION:   The patient's plan of care was discussed with: registered nurse    Patient Education  Education Given To: Patient  Education Provided: Role of Therapy;Plan of Care;Energy Conservation;IADL Safety;Equipment;Fall Prevention Strategies;Mobility Best boy Provided Comments: education on rollator use  Education Method: Verbal;Teach Back;Demonstration  Barriers to Learning: None  Education Outcome: Verbalized understanding;Demonstrated understanding      Asberry Chester, PT  Minutes: 11

## 2024-05-09 NOTE — Plan of Care (Signed)
 Problem: Chronic Conditions and Co-morbidities  Goal: Patient's chronic conditions and co-morbidity symptoms are monitored and maintained or improved  Outcome: Progressing     Problem: Safety - Adult  Goal: Free from fall injury  Outcome: Progressing     Problem: Pain  Goal: Verbalizes/displays adequate comfort level or baseline comfort level  Outcome: Progressing     Problem: Respiratory - Adult  Goal: Achieves optimal ventilation and oxygenation  Outcome: Progressing     Problem: Cardiovascular - Adult  Goal: Maintains optimal cardiac output and hemodynamic stability  Outcome: Progressing     Problem: Skin/Tissue Integrity - Adult  Goal: Skin integrity remains intact  Description: 1.  Monitor for areas of redness and/or skin breakdown  2.  Assess vascular access sites hourly  3.  Every 4-6 hours minimum:  Change oxygen saturation probe site  4.  Every 4-6 hours:  If on nasal continuous positive airway pressure, respiratory therapy assess nares and determine need for appliance change or resting period  Outcome: Progressing     Problem: Musculoskeletal - Adult  Goal: Return mobility to safest level of function  Outcome: Progressing  Goal: Return ADL status to a safe level of function  Outcome: Progressing     Problem: Infection - Adult  Goal: Absence of infection during hospitalization  Outcome: Progressing     Problem: Metabolic/Fluid and Electrolytes - Adult  Goal: Electrolytes maintained within normal limits  Outcome: Progressing  Goal: Glucose maintained within prescribed range  Outcome: Progressing     Problem: Skin/Tissue Integrity  Goal: Skin integrity remains intact  Description: 1.  Monitor for areas of redness and/or skin breakdown  2.  Assess vascular access sites hourly  3.  Every 4-6 hours minimum:  Change oxygen saturation probe site  4.  Every 4-6 hours:  If on nasal continuous positive airway pressure, respiratory therapy assess nares and determine need for appliance change or resting  period  Outcome: Progressing     Problem: Discharge Planning  Goal: Discharge to home or other facility with appropriate resources  Outcome: Progressing     Problem: Physical Therapy - Adult  Goal: By Discharge: Performs mobility at highest level of function for planned discharge setting.  See evaluation for individualized goals.  Description: FUNCTIONAL STATUS PRIOR TO ADMISSION: Patient was independent and active without use of DME.    HOME SUPPORT PRIOR TO ADMISSION: The patient lived with daughter but did not require assistance. Lives with duaghter that is high level autistic that works part time. Both live together in Extended stay motel    Physical Therapy Goals  Initiated 05/03/2024  1.  Patient will move from supine to sit and sit to supine in bed with modified independence within 7 day(s).    2.  Patient will perform sit to stand with modified independence within 7 day(s).  3.  Patient will transfer from bed to chair and chair to bed with modified independence using the least restrictive device within 7 day(s).  4.  Patient will ambulate with modified independence for 150 feet with the least restrictive device within 7 day(s).       05/08/2024 1301 by Pepper Lauraine BRAVO, PT  Outcome: Progressing

## 2024-05-09 NOTE — Progress Notes (Addendum)
 Pt not eating dinner tray, being discharged at 5:15 pm. Okay to not give sliding scale and standing insulin  order per Karenann MD. Security brought home meds to bedside.

## 2024-05-09 NOTE — Progress Notes (Signed)
 Name: Breanna Lester: [facility]   DOB: [birth year] Admit Date: 05/02/2024   Phone: [phone]  Room: 511/01   PCP: Unknown, Provider, NP-C  MRN: [id number]   Date: 05/09/2024  Code: Full Code        HPI:      Chart and notes reviewed. Data reviewed. I review the patient's current medications in the medical record at each encounter.  I have evaluated and examined the patient.     3:08 PM       History was obtained from patient.     I was asked by Consepcion LOISE Coombs, MD to see Breanna Lester in consultation for a chief complaint of COPD exacerbation, pleural effusion.    History of Present Illness:  Breanna Lester is a very pleasant, 63 yo lady with a PMH of anxiety/depression, hypertension, hyperlipidemia, COPD (no PFTs to review), obesity, DM, a-fib that presented to [facility] with worsening shortness of breath.  She was recently hospitalized from 9/3-95 with acute GI bleed and 8/28-8/28 with sepsis thought to be due to pneumonia. She was treated while hospitalized with CTX and discharged home on CTX/Azithromycin .   She reports that she continued to feel short of breath and has been using a rescue inhaler at least 6-7 times per day. She does not have a nebulizer at home. She was sent home with oxygen after her recent discharge and is wearing 2L.  She does not use a maintenance inhaler at home. She has never been tested for OSA.   She smoked 1ppd for about 15 years and quit 15 years ago.  Her mother had COPD.  She saw a pulmonologist once in [place], but never had PFTs.    Images:    Chest CT 8/26: new patchy right upper lobe asd    Chest CTA 9/14: no PE.  No aortic aneurysm. Moderate right and small left sided pleural effusion with bibasilar atx    WBC 8.3  Pro-BNP 1427--3,447 on admission     Interval History:  Afebrile  HR 60s  BP stable/elevated  Sats 90% on L NC  TTE: EF 70-75%, moderate pericardial effusion 1-2 cm, posterior pericardial effusion measuring 1.0cm, RV moderately dilated, normal  systolic function, RA mildly dilated  No documentation of I/O      ROS:  Feeling somewhat better today, but still short of breath with minimal exertion. Cough intermittently productive of sputum.         Past Medical History:   Diagnosis Date    Anxiety     Depression     Gastrointestinal disorder     Heart failure (HCC)     Hyperlipidemia     Hypertension     Liver disease 2009    liver failure    Pneumonia        Past Surgical History:   Procedure Laterality Date    ADENOIDECTOMY  1972    BACK SURGERY      cement discs following a fall    CATARACT REMOVAL      CESAREAN SECTION  1986 and 1993    DILATION AND CURETTAGE OF UTERUS  1999    EGD TRANSORAL BIOPSY SINGLE/MULTIPLE  05/17/2009         HYSTERECTOMY (CERVIX STATUS UNKNOWN)  2008    TONSILLECTOMY  1972    UPPER GASTROINTESTINAL ENDOSCOPY N/A 04/17/2024    ESOPHAGOGASTRODUODENOSCOPY performed by Madlyn Fendt, MD at [facility] ENDOSCOPY       Family History   Problem  Relation Age of Onset    Cancer Mother     Heart Disease Father        Social History     Tobacco Use    Smoking status: Former     Current packs/day: 0.00     Types: Cigarettes     Quit date: 02/28/2008     Years since quitting: 16.2    Smokeless tobacco: Never   Substance Use Topics    Alcohol use: Never       Allergies   Allergen Reactions    Latex Rash       Current Facility-Administered Medications   Medication Dose Route Frequency    glucose chewable tablet 16 g  4 tablet Oral PRN    dextrose  bolus 10% 125 mL  125 mL IntraVENous PRN    Or    dextrose  bolus 10% 250 mL  250 mL IntraVENous PRN    dextrose  10 % infusion   IntraVENous Continuous PRN    insulin  NPH (HumuLIN  N;NovoLIN  N) injection vial 22 Units  22 Units SubCUTAneous Q12H    insulin  lispro (HUMALOG ,ADMELOG ) injection vial 10 Units  0.08 Units/kg SubCUTAneous TID WC    bumetanide  (BUMEX ) tablet 1 mg  1 mg Oral Daily    methylPREDNISolone  sodium succ (SOLU-MEDROL ) 40 mg in sterile water  1 mL injection  40 mg IntraVENous Q12H    budesonide   (PULMICORT ) nebulizer suspension 1,000 mcg  1 mg Nebulization BID RT    traMADol  (ULTRAM ) tablet 50 mg  50 mg Oral Q6H PRN    cyclobenzaprine  (FLEXERIL ) tablet 10 mg  10 mg Oral TID PRN    ipratropium 0.5 mg-albuterol  2.5 mg (DUONEB ) nebulizer solution 1 Dose  1 Dose Inhalation Q6H WA RT    guaiFENesin  (MUCINEX ) extended release tablet 600 mg  600 mg Oral BID    lidocaine  4 % external patch 1 patch  1 patch TransDERmal Daily PRN    ipratropium 0.5 mg-albuterol  2.5 mg (DUONEB ) nebulizer solution 1 Dose  1 Dose Inhalation Q20 Min PRN    arformoterol  tartrate (BROVANA ) nebulizer solution 15 mcg  15 mcg Nebulization BID RT    benzonatate  (TESSALON ) capsule 100 mg  100 mg Oral TID PRN    sodium chloride  flush 0.9 % injection 5-40 mL  5-40 mL IntraVENous 2 times per day    sodium chloride  flush 0.9 % injection 5-40 mL  5-40 mL IntraVENous PRN    0.9 % sodium chloride  infusion   IntraVENous PRN    ondansetron  (ZOFRAN -ODT) disintegrating tablet 4 mg  4 mg Oral Q8H PRN    Or    ondansetron  (ZOFRAN ) injection 4 mg  4 mg IntraVENous Q6H PRN    polyethylene glycol (GLYCOLAX ) packet 17 g  17 g Oral Daily PRN    acetaminophen  (TYLENOL ) tablet 650 mg  650 mg Oral Q6H PRN    Or    acetaminophen  (TYLENOL ) suppository 650 mg  650 mg Rectal Q6H PRN    albuterol  (PROVENTIL ) (2.5 MG/3ML) 0.083% nebulizer solution 1.25 mg  1.25 mg Nebulization Q2H PRN    pantoprazole  (PROTONIX ) tablet 40 mg  40 mg Oral BID AC    sertraline  (ZOLOFT ) tablet 100 mg  100 mg Oral Daily    escitalopram  (LEXAPRO ) tablet 5 mg  5 mg Oral Daily    HYDROmorphone  (DILAUDID ) tablet 1 mg  1 mg Oral Q4H PRN    HYDROmorphone  (DILAUDID ) tablet 2 mg  2 mg Oral Q4H PRN    lactobacillus (CULTURELLE) capsule 1 capsule  1 capsule Oral Daily with breakfast    glucose chewable tablet 16 g  4 tablet Oral PRN    dextrose  bolus 10% 125 mL  125 mL IntraVENous PRN    Or    dextrose  bolus 10% 250 mL  250 mL IntraVENous PRN    glucagon  injection 1 mg  1 mg SubCUTAneous PRN    dextrose   10 % infusion   IntraVENous Continuous PRN    insulin  lispro (HUMALOG ,ADMELOG ) injection vial 0-8 Units  0-8 Units SubCUTAneous 4x Daily AC & HS    apixaban  (ELIQUIS ) tablet 5 mg  5 mg Oral BID    [Held by provider] metoprolol  tartrate (LOPRESSOR ) tablet 25 mg  25 mg Oral BID         REVIEW OF SYSTEMS   Negative except as stated in the HPI.      Physical Exam:   Vitals:    05/09/24 1321   BP:    Pulse:    Resp:    Temp:    SpO2: 90%       General:  Alert, cooperative, no distress, appears stated age.   Head:  Normocephalic, without obvious abnormality, atraumatic.   Eyes:  Conjunctivae/corneas clear.    Nose: Nares normal. Septum midline. Mucosa normal.    Throat: Lips, mucosa, and tongue normal. Teeth and gums normal.   Neck: Supple, symmetrical, trachea midline, no adenopathy   Lungs:   Exp wheeze, more diminished today   Chest wall:  No tenderness or deformity.   Heart:  Regular rate and rhythm, S1, S2 normal, no murmur, click, rub or gallop.   Abdomen:   Soft, non-tender. Bowel sounds normal.    Extremities: Extremities normal, atraumatic, no cyanosis or edema.   Pulses: 2+ and symmetric all extremities.   Skin: Skin color, texture, turgor normal   Neurologic: Grossly nonfocal       Lab Results   Component Value Date/Time    NA 139 05/09/2024 06:55 AM    K 3.5 05/09/2024 06:55 AM    CL 100 05/09/2024 06:55 AM    CO2 30 05/09/2024 06:55 AM    BUN 32 05/09/2024 06:55 AM    MG 2.3 05/09/2024 06:55 AM    PHOS 3.1 05/09/2024 06:55 AM       Lab Results   Component Value Date/Time    WBC 6.4 05/09/2024 06:55 AM    HGB 11.7 05/09/2024 06:55 AM    PLT 125 05/09/2024 06:55 AM    MCV 83.3 05/09/2024 06:55 AM       Lab Results   Component Value Date/Time    INR 1.3 05/03/2024 11:21 AM    GLOB 3.0 05/02/2024 02:44 PM       Lab Results   Component Value Date/Time    IRON 40 04/24/2024 04:59 AM    TIBC 292 04/24/2024 04:59 AM       Lab Results   Component Value Date/Time    CRP 0.6 05/07/2024 05:47 AM    ANA Negative  04/15/2024 03:15 PM    TSH 3.57 03/19/2022 12:25 PM        @BRIEFLAB24 (ph,phi,pco2,pco2i,po2,po2i,hco3,hco3i,fio2,fio2i)@    No results found for: CPK, BNP     No components found for: CULT    No results found for: CMV, RPR, HAAB, HCAB1, G6PD, HIVR, HIV1, HIV12, HIVPC, HIVRPI    No results found for: CPK    No results found for: COLOR, CASTS    IMPRESSION  Acute on Chronic Hypoxic Respiratory Failure, initially  required 4L on admission  Abnormal Chest CT: bilateral pleural effusions, cardiomegaly, and coronary vascular calcifications concerning for decompensated heart failure  Pericardial Effusion  Wheezing/Suspected COPD  Former Tobacco Use  Obesity    PLAN  Supplemental oxygen to keep sats >90%, currently on baseline 2L  Dirusis as per primary service  Brovana /Pulmicort  (increase dose), scheduled Duo-nebs. Needs to go home on a maintenance inhaler such as Trelegy or Breztri.  Continue IV steroids for now, can transition to PO tomorrow  Cardiology has evaluated and signed off  Continue Doxycycline  for 5 days total  Arrange nebulizer machine at discharge  Encourage IS use  Mucinex   OOB  Eliquis     Planned for discharge later today. Would keep on IV steroids if she is not discharged today and transition to PO tomorrow.    Comer Domino, MD

## 2024-05-09 NOTE — Progress Notes (Signed)
 New patient PCP appointment set for Wednesday Septmeber 24th 2025 at 9:30 AM at their [place] location.

## 2024-05-09 NOTE — Care Coordination-Inpatient (Signed)
 Care Management Progress Note    Reason for Admission:   COPD exacerbation (HCC) [J44.1]  New onset of congestive heart failure (HCC) [I50.9]  Acute hypoxic respiratory failure (HCC) [J96.01]       Patient Admission Status: Inpatient  RUR: 25%  Hospitalization in the last 30 days (Readmission):  Yes        Transition of care plan:  Discharge order noted  Discharge plan: home; new PCP appointment has been scheduled for 9/24; nebulizer, rollator and discharge medications approved by [employer] and given to patient at bedside from [employer]; [facility] did not have Trelegy in stock; patient instructed to contact her local pharmacy  Discharge plan communicated with patient and/or discharge caregiver: Yes    Date 1st IMM letter given: 05/06/24  Outpatient follow-up  Transport at discharge:  Roundtrip scheduled for 5:15

## 2024-05-09 NOTE — Discharge Summary (Signed)
 Hospitalist Discharge Summary     Patient ID:  Breanna Lester  [id number]  63 y.o.  Oct 22, 1960    Admit date: 05/02/2024    Discharge date and time: 05/09/2024    Admission Diagnoses: COPD exacerbation (HCC) [J44.1]  New onset of congestive heart failure (HCC) [I50.9]  Acute hypoxic respiratory failure (HCC) [J96.01]    Discharge Diagnoses:    Principal Problem (Resolved):    Acute hypoxic respiratory failure (HCC)  Active Problems:    Alcoholic cirrhosis of liver (HCC)    COPD (chronic obstructive pulmonary disease) (HCC)    Type 2 diabetes mellitus with hyperglycemia, without long-term current use of insulin  (HCC)    CAD (coronary artery disease)    HTN (hypertension)  Resolved Problems:    PAF (paroxysmal atrial fibrillation) (HCC)    Pericardial effusion         Hospital Course:       63 yo Hx of HTN, DM, Pafib on Eliquis , COPD on 3L, pancreatitis, etoh cirrhosis, presented w/ COPD exacerbation      1) Acute on chronic resp failure/hypoxia: improved, back to baseline O2.  Due to COPD.  Chest CTA neg for PE.  Normally on 3L O2 at home.    Will use Diamox  prn to control met alkalosis     2) Acute COPD exacerbation: improving, but slow.  Unclear trigger.  Flu and COVID  negative.  Cont IV solumedrol (wean as tolerated), duonebs, Doxy.  Pulm following  Back to baseline     3) Bilateral pleural effusions: likely due to diastolic dysfunction from COPD.  Recent Echo w/ EF 70-75%.   Will cont PO bumex       4) HTN/Pafib:   cont metoprolol , eliquis      5) DM type 2: A1C 8.2%.  Hyperglycemia from steroids.  Will cont lantus .  cont SSI.  DM educator following     6) Chronic pain: cont flexeril      7) Etoh cirrhosis: synthetic function intact.    F/u outpatient      8) Weakness: PT/OT recommended HH      Imaging  XR CHEST PORTABLE  Result Date: 05/08/2024  Unchanged cardiomegaly with mild edema. Electronically signed by MASSIE BRAVO PEAT    CTA CHEST W WO CONTRAST PE Eval  Result Date: 05/02/2024  There is no pulmonary embolism.  There is no aortic aneurysm. Moderate right and small left-sided pleural effusion with bibasal atelectasis. Cardiomegaly coronary vascular calcifications and pulmonary arterial hypertension. Scattered pulmonary nodules/atelectatic foci. Follow-up chest CT in 6 weeks to evaluate for resolution recommended.  Incidental findings are as described above. Electronically signed by JIMMY HABIB    XR CHEST PORTABLE  Result Date: 05/02/2024  Limited due to patient body habitus and portable technique. PA and lateral chest radiographs are recommended for better evaluation. Electronically signed by Medford Manners       PCP: Unknown, Provider, NP-C     Consults: pulmonary/intensive care    Condition of patient at discharge: Improved and Stable    Discharge Exam:    Physical Exam:    Gen:  Well-developed, well-nourished, morbidly obese, NAD  HEENT:  Pink conjunctivae, PERRL, hearing intact to voice, moist mucous membranes  Neck:  Supple, without masses, thyroid non-tender  Resp:  + accessory muscle use, scattered wheezing, better air movement  Card:  No murmurs, normal S1, S2 without thrills, bruits or peripheral edema  Abd:  Soft, non-tender, non-distended, normoactive bowel sounds are present  Musc:  No cyanosis or clubbing  Skin:  No rashes   Neuro:  Cranial nerves 3-12 are grossly intact, follows commands appropriately  Psych:  Good insight, oriented to person, place and time, alert    Disposition: home    Patient Instructions:   Current Discharge Medication List        START taking these medications    Details   albuterol  (PROVENTIL ) (2.5 MG/3ML) 0.083% nebulizer solution Take 1.5 mLs by nebulization every 2 hours as needed for Wheezing  Qty: 120 each, Refills: 3      !! apixaban  (ELIQUIS ) 5 MG TABS tablet Take 1 tablet by mouth 2 times daily  Qty: 60 tablet, Refills: 1      escitalopram  (LEXAPRO ) 5 MG tablet Take 1 tablet by mouth daily  Qty: 30 tablet, Refills: 3      benzonatate  (TESSALON ) 100 MG capsule Take 1 capsule by  mouth 3 times daily as needed for Cough  Qty: 15 capsule, Refills: 2      guaiFENesin  (MUCINEX ) 600 MG extended release tablet Take 1 tablet by mouth 2 times daily  Qty: 15 tablet, Refills: 1      bumetanide  (BUMEX ) 1 MG tablet Take 1 tablet by mouth daily  Qty: 30 tablet, Refills: 3      cyclobenzaprine  (FLEXERIL ) 10 MG tablet Take 1 tablet by mouth 3 times daily as needed for Muscle spasms  Qty: 15 tablet, Refills: 1      predniSONE  (DELTASONE ) 20 MG tablet Take 2 tablets by mouth daily for 3 days, THEN 1.5 tablets daily for 3 days, THEN 1 tablet daily for 3 days.  Qty: 14 tablet, Refills: 0      fluticasone -umeclidin-vilant (TRELEGY ELLIPTA ) 100-62.5-25 MCG/ACT AEPB inhaler Inhale 1 puff into the lungs daily  Qty: 2 each, Refills: 1       !! - Potential duplicate medications found. Please discuss with provider.        CONTINUE these medications which have CHANGED    Details   sertraline  (ZOLOFT ) 100 MG tablet Take 1 tablet by mouth daily  Qty: 30 tablet, Refills: 3      pantoprazole  (PROTONIX ) 40 MG tablet Take 1 tablet by mouth 2 times daily (before meals)  Qty: 30 tablet, Refills: 3           CONTINUE these medications which have NOT CHANGED    Details   Melatonin 10 MG TABS Take 1 tablet by mouth at bedtime      lactobacillus (CULTURELLE) capsule Take 1 capsule by mouth daily (with breakfast)  Qty: 30 capsule, Refills: 0      ondansetron  (ZOFRAN -ODT) 4 MG disintegrating tablet Take 1 tablet by mouth every 8 hours as needed for Nausea or Vomiting  Qty: 30 tablet, Refills: 0      metoprolol  tartrate (LOPRESSOR ) 25 MG tablet Take 0.5 tablets by mouth 2 times daily  Qty: 30 tablet, Refills: 1      albuterol  sulfate HFA (VENTOLIN  HFA) 108 (90 Base) MCG/ACT inhaler Inhale 2 puffs into the lungs 4 times daily as needed for Wheezing  Qty: 18 g, Refills: 0      Blood Glucose Monitoring Suppl (BLOOD GLUCOSE MONITOR SYSTEM) w/Device KIT Pharmacist to identify preferred meter and strips.  Qty: 1 kit, Refills: 0      blood  glucose monitor strips Test 1-2 times a day & as needed for symptoms of irregular blood glucose. Dispense sufficient amount for indicated testing frequency plus additional to accommodate PRN testing needs. Pharmacist to identify preferred brand.  Qty: 100 strip, Refills: 2      Lancets 30G MISC Test 1-2 times a day & as needed for symptoms of irregular blood glucose. Dispense sufficient amount for indicated testing frequency plus additional to accommodate PRN testing needs. Pharmacist to identify preferred brand.  Qty: 100 each, Refills: 2      !! apixaban  (ELIQUIS ) 5 MG TABS tablet Take 1 tablet by mouth 2 times daily      acetaminophen  (TYLENOL ) 325 MG tablet Take 1 tablet by mouth every 6 hours as needed for Pain  Qty: 40 tablet, Refills: 0       !! - Potential duplicate medications found. Please discuss with provider.        STOP taking these medications       amoxicillin  (AMOXIL ) 500 MG capsule Comments:   Reason for Stopping:             Activity: activity as tolerated  Diet: regular diet  Wound Care: none needed    Follow-up with Unknown, Provider, NP-C in 2 weeks.    Approximate time spent in patient care on day of discharge: 45 min    Signed:  Prentice Arnold, MD  05/09/2024  10:43 AM    Portions of this note were completed with Dragon, the computer voice recognition software.  Quite often unanticipated grammatical, syntax, homophones, and other interpretive errors are inadvertently transcribed by the computer software.  Please disregard these errors.  Efforts were made to edit the dictations but occasionally words are mis-transcribed.

## 2024-05-13 NOTE — Telephone Encounter (Signed)
 FYI- Ronal Dragon with [facility] called asking for a verbal confirmation that [facility] will accept to sign PT and OT home health orders for this patient.     Call back number [phone]

## 2024-05-14 ENCOUNTER — Encounter: Payer: Medicare (Managed Care) | Primary: Diagnostic Radiology

## 2024-05-14 NOTE — Telephone Encounter (Signed)
Patient was a no show for visit

## 2024-05-14 NOTE — Telephone Encounter (Signed)
 Patient will be a new patient. Will not agree until patient is seen

## 2024-06-03 ENCOUNTER — Emergency Department: Admit: 2024-06-04 | Payer: Medicare (Managed Care) | Primary: Diagnostic Radiology

## 2024-06-03 ENCOUNTER — Emergency Department: Admit: 2024-06-03 | Payer: Medicare (Managed Care) | Primary: Diagnostic Radiology

## 2024-06-03 ENCOUNTER — Inpatient Hospital Stay
Admission: EM | Admit: 2024-06-03 | Discharge: 2024-06-16 | Disposition: A | Discharge status: Home Health | Payer: Medicare (Managed Care)

## 2024-06-03 DIAGNOSIS — J9600 Acute respiratory failure, unspecified whether with hypoxia or hypercapnia: Principal | ICD-10-CM

## 2024-06-03 DIAGNOSIS — A419 Sepsis, unspecified organism: Secondary | ICD-10-CM

## 2024-06-03 LAB — COMPREHENSIVE METABOLIC PANEL
AST: 61 U/L — ABNORMAL HIGH (ref 10–35)
Albumin/Globulin Ratio: 0.7 — ABNORMAL LOW (ref 1.1–2.2)
Albumin: 2.8 g/dL — ABNORMAL LOW (ref 3.5–5.2)
Alk Phosphatase: 122 U/L — ABNORMAL HIGH (ref 35–104)
Anion Gap: 12 mmol/L (ref 2–14)
BUN/Creatinine Ratio: 19 (ref 12–20)
BUN: 25 mg/dL — ABNORMAL HIGH (ref 8–23)
CO2: 25 mmol/L (ref 20–29)
Globulin: 3.8 g/dL (ref 2.0–4.0)
Glucose: 140 mg/dL — ABNORMAL HIGH (ref 65–100)
Potassium: 4 mmol/L (ref 3.5–5.1)
Sodium: 141 mmol/L (ref 136–145)
Total Protein: 6.6 g/dL (ref 6.4–8.3)

## 2024-06-03 LAB — CBC WITH AUTO DIFFERENTIAL
Basophils %: 0.4 % (ref 0.0–1.0)
Basophils Absolute: 0.04 K/UL (ref 0.00–0.10)
Eosinophils Absolute: 0.06 K/UL (ref 0.00–0.40)
Hematocrit: 36.6 % (ref 35.0–47.0)
Hemoglobin: 10.7 g/dL — ABNORMAL LOW (ref 11.5–16.0)
Immature Granulocytes %: 0.6 % — ABNORMAL HIGH (ref 0–0.5)
Immature Granulocytes Absolute: 0.06 K/UL — ABNORMAL HIGH (ref 0.00–0.04)
Lymphocytes Absolute: 1.12 K/UL (ref 0.80–3.50)
MCH: 25.7 pg — ABNORMAL LOW (ref 26.0–34.0)
MCHC: 29.2 g/dL — ABNORMAL LOW (ref 30.0–36.5)
MCV: 88 FL (ref 80.0–99.0)
MPV: 11.5 FL (ref 8.9–12.9)
Monocytes %: 12.6 % (ref 5.0–13.0)
Monocytes Absolute: 1.18 K/UL — ABNORMAL HIGH (ref 0.00–1.00)
Neutrophils Absolute: 6.92 K/UL (ref 1.80–8.00)
Nucleated RBCs: 0 /100{WBCs}
Platelets: 172 K/uL (ref 150–400)
RBC: 4.16 M/uL (ref 3.80–5.20)
RDW: 17.4 % — ABNORMAL HIGH (ref 11.5–14.5)
WBC: 9.4 K/uL (ref 3.6–11.0)
nRBC: 0 K/uL (ref 0.00–0.01)

## 2024-06-03 LAB — TROPONIN: Troponin T: 26.7 ng/L — ABNORMAL HIGH (ref 0–14)

## 2024-06-03 MED ORDER — LORAZEPAM 1 MG PO TABS
1 | Freq: Once | ORAL | Status: AC
Start: 2024-06-03 — End: 2024-06-03
  Administered 2024-06-03: 1 mg via ORAL

## 2024-06-03 MED ORDER — BUMETANIDE 0.25 MG/ML IJ SOLN
0.25 | INTRAMUSCULAR | Status: AC
Start: 2024-06-03 — End: 2024-06-03
  Administered 2024-06-04: 1 mg via INTRAVENOUS

## 2024-06-03 MED ORDER — SODIUM CHLORIDE 0.9 % IV BOLUS
0.9 | Freq: Once | INTRAVENOUS | Status: DC
Start: 2024-06-03 — End: 2024-06-03

## 2024-06-03 MED ORDER — SERTRALINE HCL 25 MG PO TABS
25 | ORAL | Status: AC
Start: 2024-06-03 — End: 2024-06-03
  Administered 2024-06-04: 100 mg via ORAL

## 2024-06-03 MED ORDER — ESCITALOPRAM OXALATE 10 MG PO TABS
10 | ORAL | Status: AC
Start: 2024-06-03 — End: 2024-06-04

## 2024-06-03 MED FILL — LORAZEPAM 1 MG PO TABS: 1 mg | ORAL | Qty: 1

## 2024-06-03 MED FILL — SODIUM CHLORIDE 0.9 % IV SOLN: 0.9 % | INTRAVENOUS | Qty: 500

## 2024-06-03 MED FILL — BUMETANIDE 0.25 MG/ML IJ SOLN: 0.25 mg/mL | INTRAMUSCULAR | Qty: 10

## 2024-06-03 MED FILL — ESCITALOPRAM OXALATE 10 MG PO TABS: 10 mg | ORAL | Qty: 1 | Fill #0

## 2024-06-03 MED FILL — SERTRALINE HCL 25 MG PO TABS: 25 mg | ORAL | Qty: 4

## 2024-06-03 NOTE — ED Triage Notes (Addendum)
"  EMS states pt has chest pain on the right side up into her shoulder.pt has a hx of anxiety has not taken her Zoloft . Pain reported as a 4/10. Pt complained of shortness of breath oxygen administration helped to reduce chest pain. Pt is on 2 liters at home .  "

## 2024-06-03 NOTE — ED Notes (Signed)
"  Upon entering the room, pt still appears anxious. While assessing pt, pt lips noted to turn cyanotic.    Pt placed on non-re breather and respiratory called at this time. Provider to bedside; new orders to follow.     Pt attached to ED monitoring but repeatedly pulling off leads.     2011: Respiratory at bedside assessing pt; breathing treatments to be administered. Pt remaining restless and unable to sit still in bed.   "

## 2024-06-03 NOTE — ED Notes (Signed)
"  Pt increasingly restless in ED room. Respiratory at bedside and provider. Pt to be placed on BiPAP at this time due to recent ABG reading.     New orders to follow  "

## 2024-06-03 NOTE — ED Notes (Signed)
"  Pt pulling of all ED monitoring, attempting to get out of bed and ripping off BiPAP. Pt becoming aggressive with staff after multiple attempts of redirection. Multiple ED staff members, provider and respiratory at bedside at this time.      Pt unable to maintain airway without BiPAP, experiencing labored breathing,  lips turning cyanotic and pt not following commands and is not redirectable. Preparing to intubate pt at this time. New orders to follow.     "

## 2024-06-03 NOTE — H&P (Signed)
 "CRITICAL CARE ADMISSION NOTE    Name: Breanna Lester   DOB: [birth year]   MRN: [id number]   Date: 06/03/2024      Diagnoses/problem list:   Acute exacerbation of chronic respiratory failure  Bilateral pneumonia  Acute kidney injury  Bacteriuria  Septic shock  Amphetamine use  HPI   Breanna Lester is a 63 year old with known past medical history significant for anxiety, depression, heart failure with preserved ejection fraction, hyperlipidemia, hypertension, liver failure diagnosed in 2009, who presented to the emergency department with chief complaint of shortness of breath.  Patient reported that she was having a verbal altercation with someone she lives with and had sudden onset of shortness of breath.  Patient wears 2 L of nasal cannula oxygen at baseline, and was recently diagnosed with pneumonia.  Also had complaints of midsternal chest pain that radiated to the shoulders which  had resolved.  She also endorsed cough but denied any fever or chills.  Patient was initially started on BiPAP, treated with nebulizers and Bumex , chest x-ray in the emergency department showed mild pulmonary edema, however patient continued to become increasingly agitated, confused, with increased respiratory distress prompting her to be intubated.  After intubation CT scan of the chest revealed extensive bilateral infiltrates.  Critical care medicine was consulted for admission to the ICU    ROS negative except as otherwise documented.      Assessment and plan:     NEUROLOGICAL:    Analgesia / sedation  Anxiety  Depression  -Goal RASS -1  - Propofol  infusion  - Versed  infusion  - Daily SBT/SAT  - CT head completed and pending read    PULMONOLOGY:   Acute exacerbation of chronic hypoxemic respiratory failure  Intubation with mechanical ventilation  Bilateral pneumonia  - maintain SpO2>=92, pH>=7.30  - lung protective ventilation; wean vent as tolerated  - daily SAT/SBT  - follow ABG and CXR  - Patient was intubated on 06/03/2024 in the  emergency department  - Ceftriaxone  2 g every 24 hours  - Pharmacy consulted for vancomycin  dosing  - Continue Bumex  1 mg IV daily  - Pulmicort  nebulizer twice daily  - Ipratropium/albuterol  4 times daily    CARDIOVASCULAR:   Septic shock  Heart failure with preserved ejection fraction, EF 70%  -Goal MAP>=65  -Trend troponin / lactate  -EKG PRN  - Started on norepinephrine  infusion in the emergency department  - Wean norepinephrine  as tolerated  - BNP elevated to 11,914  - Troponin T 30, likely elevated secondary to respiratory distress and hypoxemia along with amphetamine use    GASTROINTESTINAL:   Nutrition: NPO  -TF if remains intubated > 24 hours  Bowel regimen  - MiraLAX  daily  -bisacodyl PRN  GI Px  - Protonix  40 mg daily  - Zofran  as needed for nausea or vomiting     RENAL/ELECTROLYTE:   AKI-nonoliguric  - goal K>=4, Mg>=2, Phos >3  - daily BMP  - strict I/O's; daily weights  - avoid nephrotoxic medications    ENDOCRINE:   Hyperglycemia  -Trend glucose  - Sliding scale insulin  as needed    HEMATOLOGY/ONCOLOGY:   No acute concerns  VTE Px  -Goal Hb>=7  -Trend CBC,PTT/INR  -Enoxaparin , AC/AP   -SCDs    ID/MICRO:   Pneumonia  -Trend fevers, WBC  -Blood cx x2, Urine Cx, Sputum Cx done  - Calculate PF ratio, concern for ARDS  - Ceftriaxone  2 g daily started on 10/15  - Vancomycin  per pharmacy dosing  -  Sputum culture    ICU DAILY CHECKLIST     Code Status: Full code  DVT Prophylaxis: Lovenox   T/L/D: Peripheral IV, NG tube, endotracheal tube  Diet: N.p.o.  Activity Level: Up as tolerated  ABCDEF Bundle/Checklist Completed: Yes  Disposition: Stay in ICU  Multidisciplinary Rounds Completed:  Pending    Past Medical History:      has a past medical history of Anxiety, Depression, Gastrointestinal disorder, Heart failure (HCC), Hyperlipidemia, Hypertension, Liver disease, and Pneumonia.    Past Surgical History:      has a past surgical history that includes Cataract removal; egd transoral biopsy single/multiple  (05/17/2009); Dilation and curettage of uterus (1999); Hysterectomy (2008); Cesarean section (1986 and 1993); Adenoidectomy (1972); Tonsillectomy (1972); back surgery; and Upper gastrointestinal endoscopy (N/A, 04/17/2024).    Home Medications:     Prior to Admission medications   Medication Sig Start Date End Date Taking? Authorizing Provider   albuterol  (PROVENTIL ) (2.5 MG/3ML) 0.083% nebulizer solution Take 1.5 mLs by nebulization every 2 hours as needed for Wheezing 05/09/24   Karenann Barter, MD   apixaban  (ELIQUIS ) 5 MG TABS tablet Take 1 tablet by mouth 2 times daily 05/09/24   Karenann Barter, MD   escitalopram  (LEXAPRO ) 5 MG tablet Take 1 tablet by mouth daily 05/10/24   Karenann Barter, MD   sertraline  (ZOLOFT ) 100 MG tablet Take 1 tablet by mouth daily 05/10/24   Karenann Barter, MD   guaiFENesin  (MUCINEX ) 600 MG extended release tablet Take 1 tablet by mouth 2 times daily 05/09/24   Karenann Barter, MD   bumetanide  (BUMEX ) 1 MG tablet Take 1 tablet by mouth daily 05/10/24   Karenann Barter, MD   pantoprazole  (PROTONIX ) 40 MG tablet Take 1 tablet by mouth 2 times daily (before meals) 05/09/24   Karenann Barter, MD   fluticasone -umeclidin-vilant (TRELEGY ELLIPTA ) 100-62.5-25 MCG/ACT AEPB inhaler Inhale 1 puff into the lungs daily 05/09/24 06/08/24  Karenann Barter, MD   Melatonin 10 MG TABS Take 1 tablet by mouth at bedtime    [provider]   lactobacillus (CULTURELLE) capsule Take 1 capsule by mouth daily (with breakfast) 04/26/24   Bowlin, Amber L, APRN - CNP   ondansetron  (ZOFRAN -ODT) 4 MG disintegrating tablet Take 1 tablet by mouth every 8 hours as needed for Nausea or Vomiting 04/17/24   Helmich, Jamie L, MD   metoprolol  tartrate (LOPRESSOR ) 25 MG tablet Take 0.5 tablets by mouth 2 times daily 04/11/24 06/10/24  Bertell Bathe, MD   albuterol  sulfate HFA (VENTOLIN  HFA) 108 (90 Base) MCG/ACT inhaler Inhale 2 puffs into the lungs 4 times daily as needed for Wheezing 04/11/24 06/10/24  Bertell Bathe, MD   Blood Glucose  Monitoring Suppl (BLOOD GLUCOSE MONITOR SYSTEM) w/Device KIT Pharmacist to identify preferred meter and strips. 04/11/24   Smithson, Olivia LABOR, APRN - CNS   blood glucose monitor strips Test 1-2 times a day & as needed for symptoms of irregular blood glucose. Dispense sufficient amount for indicated testing frequency plus additional to accommodate PRN testing needs. Pharmacist to identify preferred brand. 04/11/24   Smithson, Olivia LABOR, APRN - CNS   Lancets 30G MISC Test 1-2 times a day & as needed for symptoms of irregular blood glucose. Dispense sufficient amount for indicated testing frequency plus additional to accommodate PRN testing needs. Pharmacist to identify preferred brand. 04/11/24   Smithson, Olivia LABOR, APRN - CNS   apixaban  (ELIQUIS ) 5 MG TABS tablet Take 1 tablet by mouth 2 times daily    [provider]   acetaminophen  (TYLENOL ) 325 MG tablet Take 1 tablet by mouth every 6 hours as needed for Pain 02/26/24 03/07/24  Cutchins, Morene DASEN, PA-C       Allergies/Social/Family History:     Allergies   Allergen Reactions    Latex Rash      Social History     Tobacco Use    Smoking status: Former     Current packs/day: 0.00     Types: Cigarettes     Quit date: 02/28/2008     Years since quitting: 16.2    Smokeless tobacco: Never   Substance Use Topics    Alcohol use: Never      Family History   Problem Relation Age of Onset    Cancer Mother     Heart Disease Father          OBJECTIVE:     Blood pressure 132/69, pulse 89, temperature 97.9 F (36.6 C), temperature source Oral, resp. rate 24, height 1.575 m (5' 2), weight 122.5 kg (270 lb), SpO2 98%.  Physical Exam  Physical Exam  Constitutional:       Appearance: She is ill-appearing and toxic-appearing.      Interventions: She is sedated, intubated and restrained.   HENT:      Head: Normocephalic and atraumatic.      Nose: Nose normal.      Mouth/Throat:      Lips: Pink.      Mouth: Mucous membranes are dry.      Pharynx: Oropharynx is clear.   Eyes:       Conjunctiva/sclera: Conjunctivae normal.      Pupils: Pupils are equal, round, and reactive to light.   Cardiovascular:      Rate and Rhythm: Regular rhythm. Tachycardia present.   Pulmonary:      Effort: She is intubated.      Breath sounds: Normal air entry.   Abdominal:      General: Abdomen is flat. Bowel sounds are normal.      Palpations: Abdomen is soft.      Tenderness: There is no abdominal tenderness.   Musculoskeletal:      Cervical back: Full passive range of motion without pain.   Skin:     General: Skin is warm.      Capillary Refill: Capillary refill takes 2 to 3 seconds.      Coloration: Skin is pale.   Neurological:      Comments: Unable to accurately assess due to sedation           Labs and Data: Reviewed 06/03/24  Medications: Reviewed 06/03/24  Imaging: Reviewed 06/03/24    Intake/Output:   No intake or output data in the 24 hours ending 06/03/24 2323    CRITICAL CARE DOCUMENTATION  I had a face to face encounter with the patient, reviewed and interpreted patient data including clinical events, labs, images, vital signs, I/O's, and examined patient.  I have discussed the case and the plan and management of the patient's care with the consulting services, the bedside nurses and the respiratory therapist.      NOTE OF PERSONAL INVOLVEMENT IN CARE   This patient has a high probability of imminent, clinically significant deterioration, which requires the highest level of preparedness to intervene urgently. I participated in the decision-making and personally managed or directed the management of the following life and organ supporting interventions that required my frequent assessment to treat or prevent imminent deterioration.    I  personally spent 91 minutes of critical care time.  This is time spent at this critically ill patient's bedside actively involved in patient care as well as the coordination of care.  This does not include any procedural time which has been billed separately.      Lynwood HERO. Marvis, DNP, APRN, AGACNP-BC, CCRN  Critical Care Medicine  [facility]    "

## 2024-06-03 NOTE — ED Notes (Signed)
"  Pt remains restless and unable to cooperate with BiPAP. Pt having increasing agitation and pulling off ED monitoring leads. Pt having difficulty following commands and is becoming non-redirectable. Provider ntfd. Respiratory remains at bedside.     2115: Pt growing increasingly agitated and not listening to ED staff. Pt repeatedly attempting to take off BiPAP. Provider ntfd and called to bedside. New orders to follow. Safety sitter at bedside to maintain pt safety and to assist with constant redirection.     2122: New medication ordered in [facility] and administered at this time by provider. Pt safety sitter at bedside.   "
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
## 2024-06-03 NOTE — ED Notes (Signed)
"  Assumed care of pt. Pt appears anxious in room, detaching herself from ED monitoring and appears restless. Upon assessment, pt states that she feels anxious and takes medication at home. Pt reports not taking anxiety medication prior to arrival to ED.     Provider ntfd and made aware. New orders to follow  "

## 2024-06-04 ENCOUNTER — Inpatient Hospital Stay: Admit: 2024-06-04 | Payer: Medicare (Managed Care) | Primary: Diagnostic Radiology

## 2024-06-04 LAB — CBC
Hematocrit: 34.2 % — ABNORMAL LOW (ref 35.0–47.0)
Hemoglobin: 10.2 g/dL — ABNORMAL LOW (ref 11.5–16.0)
MCH: 26.2 pg (ref 26.0–34.0)
MCHC: 29.8 g/dL — ABNORMAL LOW (ref 30.0–36.5)
MCV: 87.9 FL (ref 80.0–99.0)
MPV: 11.2 FL (ref 8.9–12.9)
Nucleated RBCs: 0.1 /100{WBCs} — ABNORMAL HIGH
Platelets: 196 K/uL (ref 150–400)
RBC: 3.89 M/uL (ref 3.80–5.20)
RDW: 17.9 % — ABNORMAL HIGH (ref 11.5–14.5)
WBC: 15.6 K/uL — ABNORMAL HIGH (ref 3.6–11.0)
nRBC: 0.02 K/uL — ABNORMAL HIGH (ref 0.00–0.01)

## 2024-06-04 LAB — BLOOD GAS, ARTERIAL
Base deficit, arterial blood: 3.6 mmol/L
Carboxyhgb, Arterial: 0.7 % — ABNORMAL LOW (ref 1–2)
FIO2 Arterial: 45 %
HCO3, Arterial: 23 mmol/L (ref 22–26)
Methemoglobin, Arterial: 0.4 % (ref 0–1.4)
O2 Sat, Arterial: 100 % — ABNORMAL HIGH (ref 95–99)
Oxyhemoglobin: 98.6 % (ref 95–99)
POC PEEP/CPA: 8
POC TIDAL VOLUME: 380
Set Rate, POC: 16
Temperature: 98.6
pCO2, Arterial: 47 mmHg — ABNORMAL HIGH (ref 35–45)
pH, Arterial: 7.31 — ABNORMAL LOW (ref 7.35–7.45)
pO2, Arterial: 361 mmHg — ABNORMAL HIGH (ref 80–100)

## 2024-06-04 LAB — POCT GLUCOSE
POC Glucose: 156 mg/dL — ABNORMAL HIGH (ref 65–100)
POC Glucose: 208 mg/dL — ABNORMAL HIGH (ref 65–100)

## 2024-06-04 LAB — BASIC METABOLIC PANEL
Anion Gap: 13 mmol/L (ref 2–14)
BUN/Creatinine Ratio: 19 (ref 12–20)
BUN: 28 mg/dL — ABNORMAL HIGH (ref 8–23)
CO2: 24 mmol/L (ref 20–29)
Calcium: 9.1 mg/dL (ref 8.8–10.2)
Chloride: 102 mmol/L (ref 98–107)
Creatinine: 1.44 mg/dL — ABNORMAL HIGH (ref 0.60–1.00)
Est, Glom Filt Rate: 41 ml/min/1.73m2 — ABNORMAL LOW (ref >59)
Glucose: 138 mg/dL — ABNORMAL HIGH (ref 65–100)
Potassium: 3.7 mmol/L (ref 3.5–5.1)
Sodium: 139 mmol/L (ref 136–145)

## 2024-06-04 LAB — POC CG4:BLOOD GAS,LACTATE
Base Deficit (POC): 5.3 mmol/L
POC HCO3: 21.7 mmol/L (ref 19.0–28.0)
POC Lactic Acid: 2.34 mmol/L (ref 0.40–2.00)
POC O2 SAT: 96.4 %
POC PO2: 98 mmHg (ref 75–100)
POC pCO2: 47.5 mmHg — ABNORMAL HIGH (ref 35.0–45.0)
POC pH: 7.27 — ABNORMAL LOW (ref 7.35–7.45)

## 2024-06-04 LAB — URINE DRUG SCREEN
Amphetamine, Urine: POSITIVE — AB
Barbiturates, Urine: NEGATIVE
Benzodiazepines, Urine: NEGATIVE
Cocaine, Urine: NEGATIVE
Fentanyl: NEGATIVE
Methadone, Urine: NEGATIVE
Opiates, Urine: NEGATIVE
Phencyclidine, Urine: NEGATIVE
THC, TH-Cannabinol, Urine: NEGATIVE

## 2024-06-04 LAB — URINALYSIS WITH REFLEX TO CULTURE
Bilirubin Confirmation, UA: NEGATIVE
Blood, Urine: NEGATIVE
Glucose, Ur: NEGATIVE mg/dL
Ketones, Urine: 5 mg/dL — AB
Nitrite, Urine: NEGATIVE
Protein, UA: >300 mg/dL — AB
Specific Gravity, UA: >1.03 — ABNORMAL HIGH (ref 1.003–1.030)
Urobilinogen, Urine: 4 EU/dL — ABNORMAL HIGH (ref 0.1–1.0)

## 2024-06-04 LAB — COVID-19 & INFLUENZA COMBO
Rapid Influenza A By PCR: NOT DETECTED
Rapid Influenza B By PCR: NOT DETECTED
SARS-CoV-2, PCR: NOT DETECTED

## 2024-06-04 LAB — ARTERIAL BLOOD GAS, POC
POC O2 SAT: 89.5 %
POC pCO2: 46.3 mmHg — ABNORMAL HIGH (ref 35.0–45.0)
POC pH: 7.29 — ABNORMAL LOW (ref 7.35–7.45)

## 2024-06-04 LAB — BRAIN NATRIURETIC PEPTIDE: NT Pro-BNP: 11914 pg/mL — ABNORMAL HIGH (ref 0–125)

## 2024-06-04 LAB — POC LACTIC ACID

## 2024-06-04 LAB — MAGNESIUM: Magnesium: 2.1 mg/dL (ref 1.6–2.4)

## 2024-06-04 LAB — LACTIC ACID: Lactic Acid: 1.5 mmol/L (ref 0.5–2.0)

## 2024-06-04 LAB — TROPONIN: Troponin T: 30 ng/L — ABNORMAL HIGH (ref 0–14)

## 2024-06-04 LAB — PHOSPHORUS: Phosphorus: 4.3 mg/dL (ref 2.5–4.5)

## 2024-06-04 LAB — MRSA BY PCR: MRSA by PCR: NOT DETECTED

## 2024-06-04 MED ORDER — IPRATROPIUM-ALBUTEROL 0.5-2.5 (3) MG/3ML IN SOLN
0.5-2.5 | Freq: Four times a day (QID) | RESPIRATORY_TRACT | Status: DC
Start: 2024-06-04 — End: 2024-06-05
  Administered 2024-06-04 – 2024-06-05 (×6): 1 via RESPIRATORY_TRACT

## 2024-06-04 MED ORDER — MAGNESIUM SULFATE 2000 MG/50 ML IVPB PREMIX
2 | INTRAVENOUS | Status: DC | PRN
Start: 2024-06-04 — End: 2024-06-16

## 2024-06-04 MED ORDER — PANTOPRAZOLE SODIUM 40 MG IV SOLR
40 | Freq: Every day | INTRAVENOUS | Status: AC
Start: 2024-06-04 — End: 2024-06-06
  Administered 2024-06-04 – 2024-06-06 (×3): 40 mg via INTRAVENOUS

## 2024-06-04 MED ORDER — ETOMIDATE 2 MG/ML IV SOLN
2 | Freq: Every day | INTRAVENOUS | Status: AC | PRN
Start: 2024-06-04 — End: 2024-06-03
  Administered 2024-06-04: 03:00:00 20 via INTRAVENOUS

## 2024-06-04 MED ORDER — POTASSIUM CHLORIDE 20 MEQ/50ML IV SOLN
20 | INTRAVENOUS | Status: DC | PRN
Start: 2024-06-04 — End: 2024-06-16

## 2024-06-04 MED ORDER — MIDAZOLAM-SODIUM CHLORIDE 50-0.9 MG/50ML-% IV SOLN
50-0.9 | INTRAVENOUS | Status: DC
Start: 2024-06-04 — End: 2024-06-06
  Administered 2024-06-04: 04:00:00 2 mg/h via INTRAVENOUS
  Administered 2024-06-04: 11:00:00 5 mg/h via INTRAVENOUS

## 2024-06-04 MED ORDER — FENTANYL CITRATE (PF) 100 MCG/2ML IJ SOLN
100 | INTRAMUSCULAR | Status: AC | PRN
Start: 2024-06-04 — End: 2024-06-07
  Administered 2024-06-05 (×2): 50 ug via INTRAVENOUS

## 2024-06-04 MED ORDER — DEXTROSE 10 % IV BOLUS
INTRAVENOUS | Status: DC | PRN
Start: 2024-06-04 — End: 2024-06-16

## 2024-06-04 MED ORDER — VANCOMYCIN INTERMITTENT DOSING (PLACEHOLDER)
INTRAVENOUS | Status: DC
Start: 2024-06-04 — End: 2024-06-04

## 2024-06-04 MED ORDER — GLUCOSE 4 G PO CHEW
4 | ORAL | Status: DC | PRN
Start: 2024-06-04 — End: 2024-06-16
  Administered 2024-06-12: 07:00:00 16 via ORAL

## 2024-06-04 MED ORDER — PROPOFOL 1000 MG/100ML IV EMUL
1000 | INTRAVENOUS | Status: DC
Start: 2024-06-04 — End: 2024-06-06
  Administered 2024-06-04 (×5): 50 ug/kg/min via INTRAVENOUS
  Administered 2024-06-04 – 2024-06-05 (×2): 45 ug/kg/min via INTRAVENOUS
  Administered 2024-06-05: 13:00:00 35 ug/kg/min via INTRAVENOUS
  Administered 2024-06-05: 21:00:00 10 ug/kg/min via INTRAVENOUS
  Administered 2024-06-05 (×2): 50 ug/kg/min via INTRAVENOUS
  Administered 2024-06-05: 05:00:00 45 ug/kg/min via INTRAVENOUS
  Administered 2024-06-06: 11:00:00 20 ug/kg/min via INTRAVENOUS
  Administered 2024-06-06: 06:00:00 40 ug/kg/min via INTRAVENOUS

## 2024-06-04 MED ORDER — NORMAL SALINE FLUSH 0.9 % IV SOLN
0.9 | INTRAVENOUS | Status: DC | PRN
Start: 2024-06-04 — End: 2024-06-16

## 2024-06-04 MED ORDER — PROPOFOL 1000 MG/100ML IV EMUL
1000 | INTRAVENOUS | Status: AC
Start: 2024-06-04 — End: 2024-06-04
  Administered 2024-06-04: 06:00:00 50 via INTRAVENOUS

## 2024-06-04 MED ORDER — IOPAMIDOL 76 % IV SOLN
76 | Freq: Once | INTRAVENOUS | Status: AC | PRN
Start: 2024-06-04 — End: 2024-06-04
  Administered 2024-06-04: 06:00:00 100 mL via INTRAVENOUS

## 2024-06-04 MED ORDER — ENOXAPARIN SODIUM 30 MG/0.3ML IJ SOSY
30 | Freq: Two times a day (BID) | INTRAMUSCULAR | Status: DC
Start: 2024-06-04 — End: 2024-06-04
  Administered 2024-06-04: 13:00:00 30 mg via SUBCUTANEOUS

## 2024-06-04 MED ORDER — METHYLPREDNISOLONE SODIUM SUCC 40 MG IJ SOLR
40 | Freq: Two times a day (BID) | INTRAMUSCULAR | Status: DC
Start: 2024-06-04 — End: 2024-06-07
  Administered 2024-06-04 – 2024-06-07 (×7): 40 mg via INTRAVENOUS

## 2024-06-04 MED ORDER — NORMAL SALINE FLUSH 0.9 % IV SOLN
0.9 | Freq: Two times a day (BID) | INTRAVENOUS | Status: DC
Start: 2024-06-04 — End: 2024-06-10
  Administered 2024-06-04 – 2024-06-09 (×9): 10 mL via INTRAVENOUS
  Administered 2024-06-09: 01:00:00 20 mL via INTRAVENOUS
  Administered 2024-06-10 (×2): 10 mL via INTRAVENOUS

## 2024-06-04 MED ORDER — ACETAMINOPHEN 325 MG PO TABS
325 | Freq: Four times a day (QID) | ORAL | Status: DC | PRN
Start: 2024-06-04 — End: 2024-06-16
  Administered 2024-06-08 – 2024-06-16 (×10): 650 mg via ORAL

## 2024-06-04 MED ORDER — NOREPINEPHRINE BITARTRATE 8 MG/250 ML (32 MCG/ML) IN 0.9 % NACL IV
8-0.9 | INTRAVENOUS | Status: DC
Start: 2024-06-04 — End: 2024-06-06
  Administered 2024-06-04: 23:00:00 7 ug/min via INTRAVENOUS

## 2024-06-04 MED ORDER — POTASSIUM CHLORIDE 10 MEQ/100ML IV SOLN
10 | INTRAVENOUS | Status: DC | PRN
Start: 2024-06-04 — End: 2024-06-16

## 2024-06-04 MED ORDER — BUMETANIDE 0.25 MG/ML IJ SOLN
0.25 | Freq: Every day | INTRAMUSCULAR | Status: DC
Start: 2024-06-04 — End: 2024-06-09
  Administered 2024-06-08 – 2024-06-09 (×2): 1 mg via INTRAVENOUS

## 2024-06-04 MED ORDER — SUCCINYLCHOLINE CHLORIDE 20 MG/ML IJ SOLN
20 | INTRAMUSCULAR | Status: AC
Start: 2024-06-04 — End: 2024-06-04

## 2024-06-04 MED ORDER — DEXTROSE 10 % IV SOLN
10 | INTRAVENOUS | Status: DC | PRN
Start: 2024-06-04 — End: 2024-06-16

## 2024-06-04 MED ORDER — IPRATROPIUM-ALBUTEROL 0.5-2.5 (3) MG/3ML IN SOLN
0.5-2.5 | RESPIRATORY_TRACT | Status: AC
Start: 2024-06-04 — End: 2024-06-03
  Administered 2024-06-04 (×3): 1 via RESPIRATORY_TRACT

## 2024-06-04 MED ORDER — AZITHROMYCIN 500 MG IV SOLR
500 | INTRAVENOUS | Status: AC
Start: 2024-06-04 — End: 2024-06-08
  Administered 2024-06-04 – 2024-06-08 (×5): 500 mg via INTRAVENOUS

## 2024-06-04 MED ORDER — NOREPINEPHRINE BITARTRATE 8 MG/250 ML (32 MCG/ML) IN 0.9 % NACL IV
8-0.9 | INTRAVENOUS | Status: AC
Start: 2024-06-04 — End: 2024-06-04
  Administered 2024-06-04: 04:00:00 4 via INTRAVENOUS

## 2024-06-04 MED ORDER — POLYETHYLENE GLYCOL 3350 17 G PO PACK
17 | Freq: Every day | ORAL | Status: DC | PRN
Start: 2024-06-04 — End: 2024-06-16

## 2024-06-04 MED ORDER — ONDANSETRON HCL 4 MG/2ML IJ SOLN
4 | Freq: Four times a day (QID) | INTRAMUSCULAR | Status: DC | PRN
Start: 2024-06-04 — End: 2024-06-16
  Administered 2024-06-07: 14:00:00 4 mg via INTRAVENOUS

## 2024-06-04 MED ORDER — APIXABAN 5 MG PO TABS
5 | Freq: Two times a day (BID) | ORAL | Status: DC
Start: 2024-06-04 — End: 2024-06-16
  Administered 2024-06-04 – 2024-06-16 (×24): 5 mg via NASOGASTRIC

## 2024-06-04 MED ORDER — CEFTRIAXONE SODIUM 2 G IJ SOLR
2 | INTRAMUSCULAR | Status: AC
Start: 2024-06-04 — End: 2024-06-12
  Administered 2024-06-04 – 2024-06-12 (×9): 2000 mg via INTRAVENOUS

## 2024-06-04 MED ORDER — SERTRALINE HCL 50 MG PO TABS
50 | Freq: Every day | ORAL | Status: DC
Start: 2024-06-04 — End: 2024-06-16
  Administered 2024-06-05 – 2024-06-16 (×12): 100 mg via ORAL

## 2024-06-04 MED ORDER — TIOTROPIUM BROMIDE MONOHYDRATE 2.5 MCG/ACT IN AERS
2.5 | Freq: Every day | RESPIRATORY_TRACT | Status: DC
Start: 2024-06-04 — End: 2024-06-06

## 2024-06-04 MED ORDER — MIDAZOLAM HCL (PF) 2 MG/2ML IJ SOLN
2 | Freq: Once | INTRAMUSCULAR | Status: AC
Start: 2024-06-04 — End: 2024-06-03
  Administered 2024-06-04: 01:00:00 5 mg via INTRAVENOUS

## 2024-06-04 MED ORDER — MIDODRINE HCL 5 MG PO TABS
5 | Freq: Three times a day (TID) | ORAL | Status: DC
Start: 2024-06-04 — End: 2024-06-06
  Administered 2024-06-04 – 2024-06-05 (×5): 10 mg via ORAL

## 2024-06-04 MED ORDER — BUDESONIDE-FORMOTEROL FUMARATE 160-4.5 MCG/ACT IN AERO
160-4.5 | Freq: Two times a day (BID) | RESPIRATORY_TRACT | Status: DC
Start: 2024-06-04 — End: 2024-06-06

## 2024-06-04 MED ORDER — SUCCINYLCHOLINE CHLORIDE 20 MG/ML IJ SOLN
20 | Freq: Every day | INTRAMUSCULAR | Status: AC | PRN
Start: 2024-06-04 — End: 2024-06-03
  Administered 2024-06-04: 03:00:00 150 via INTRAVENOUS

## 2024-06-04 MED ORDER — GLUCAGON HCL (DIAGNOSTIC) 1 MG IJ SOLR
1 | INTRAMUSCULAR | Status: DC | PRN
Start: 2024-06-04 — End: 2024-06-16

## 2024-06-04 MED ORDER — SODIUM CHLORIDE 0.9 % IV SOLN
0.9 | INTRAVENOUS | Status: DC | PRN
Start: 2024-06-04 — End: 2024-06-16
  Administered 2024-06-04: 08:00:00 80 via INTRAVENOUS

## 2024-06-04 MED ORDER — INSULIN LISPRO 100 UNIT/ML IJ SOLN
100 | Freq: Four times a day (QID) | INTRAMUSCULAR | Status: DC
Start: 2024-06-04 — End: 2024-06-05
  Administered 2024-06-04 – 2024-06-05 (×3): 1 [IU] via SUBCUTANEOUS

## 2024-06-04 MED ORDER — PROPOFOL 1000 MG/100ML IV EMUL
1000 | INTRAVENOUS | Status: AC
Start: 2024-06-04 — End: 2024-06-03
  Administered 2024-06-04: 03:00:00 20 via INTRAVENOUS

## 2024-06-04 MED ORDER — SODIUM CHLORIDE 0.9 % IV SOLN
0.9 | Freq: Once | INTRAVENOUS | Status: AC
Start: 2024-06-04 — End: 2024-06-04
  Administered 2024-06-04: 09:00:00 1250 mg via INTRAVENOUS

## 2024-06-04 MED ORDER — ESCITALOPRAM OXALATE 10 MG PO TABS
10 | Freq: Every evening | ORAL | Status: DC
Start: 2024-06-04 — End: 2024-06-04

## 2024-06-04 MED ORDER — ETOMIDATE 2 MG/ML IV SOLN
2 | INTRAVENOUS | Status: AC
Start: 2024-06-04 — End: 2024-06-04

## 2024-06-04 MED ORDER — ONDANSETRON 4 MG PO TBDP
4 | Freq: Three times a day (TID) | ORAL | Status: DC | PRN
Start: 2024-06-04 — End: 2024-06-16

## 2024-06-04 MED ORDER — SODIUM CHLORIDE 0.9 % IV SOLN
0.9 | Freq: Once | INTRAVENOUS | Status: AC
Start: 2024-06-04 — End: 2024-06-04
  Administered 2024-06-04: 08:00:00 1250 mg via INTRAVENOUS

## 2024-06-04 MED ORDER — BUDESONIDE 0.5 MG/2ML IN SUSP
0.5 | Freq: Two times a day (BID) | RESPIRATORY_TRACT | Status: DC
Start: 2024-06-04 — End: 2024-06-04
  Administered 2024-06-04: 12:00:00 500 mg via RESPIRATORY_TRACT

## 2024-06-04 MED ORDER — ACETAMINOPHEN 650 MG RE SUPP
650 | Freq: Four times a day (QID) | RECTAL | Status: DC | PRN
Start: 2024-06-04 — End: 2024-06-16

## 2024-06-04 MED FILL — VANCOMYCIN HCL 1.25 G IV SOLR: 1.25 g | INTRAVENOUS | Qty: 1250

## 2024-06-04 MED FILL — DIPRIVAN 1000 MG/100ML IV EMUL: 1000 MG/100ML | INTRAVENOUS | Qty: 100

## 2024-06-04 MED FILL — IPRATROPIUM-ALBUTEROL 0.5-2.5 (3) MG/3ML IN SOLN: 0.5-2.5 (3) MG/3ML | RESPIRATORY_TRACT | Qty: 3

## 2024-06-04 MED FILL — ENOXAPARIN SODIUM 30 MG/0.3ML IJ SOSY: 30 MG/0.3ML | INTRAMUSCULAR | Qty: 0.3

## 2024-06-04 MED FILL — INSULIN LISPRO 100 UNIT/ML IJ SOLN: 100 [IU]/mL | INTRAMUSCULAR | Qty: 1

## 2024-06-04 MED FILL — MIDODRINE HCL 5 MG PO TABS: 5 mg | ORAL | Qty: 2

## 2024-06-04 MED FILL — ISOVUE-370 76 % IV SOLN: 76 % | INTRAVENOUS | Qty: 100

## 2024-06-04 MED FILL — MIDAZOLAM HCL 2 MG/2ML IJ SOLN: 2 mg/mL | INTRAMUSCULAR | Qty: 6

## 2024-06-04 MED FILL — METHYLPREDNISOLONE SODIUM SUCC 40 MG IJ SOLR: 40 mg | INTRAMUSCULAR | Qty: 40

## 2024-06-04 MED FILL — AZITHROMYCIN 500 MG IV SOLR: 500 mg | INTRAVENOUS | Qty: 500

## 2024-06-04 MED FILL — ELIQUIS 5 MG PO TABS: 5 mg | ORAL | Qty: 1

## 2024-06-04 MED FILL — SODIUM CHLORIDE FLUSH 0.9 % IV SOLN: 0.9 % | INTRAVENOUS | Qty: 40

## 2024-06-04 MED FILL — NOREPINEPHRINE BITARTRATE 8 MG/250 ML (32 MCG/ML) IN 0.9 % NACL IV: 8-0.9 MG/250ML-% | INTRAVENOUS | Qty: 250

## 2024-06-04 MED FILL — QUELICIN 20 MG/ML IJ SOLN: 20 mg/mL | INTRAMUSCULAR | Qty: 10

## 2024-06-04 MED FILL — CEFTRIAXONE SODIUM 2 G IJ SOLR: 2 g | INTRAMUSCULAR | Qty: 2000

## 2024-06-04 MED FILL — ETOMIDATE 2 MG/ML IV SOLN: 2 mg/mL | INTRAVENOUS | Qty: 20

## 2024-06-04 MED FILL — MIDAZOLAM-SODIUM CHLORIDE 50-0.9 MG/50ML-% IV SOLN: 50-0.9 MG/50ML-% | INTRAVENOUS | Qty: 50

## 2024-06-04 MED FILL — PANTOPRAZOLE SODIUM 40 MG IV SOLR: 40 mg | INTRAVENOUS | Qty: 40

## 2024-06-04 MED FILL — DEXTROSE 10 % IV SOLN: 10 % | INTRAVENOUS | Qty: 1000

## 2024-06-04 NOTE — Plan of Care (Signed)
"    Problem: Chronic Conditions and Co-morbidities  Goal: Patient's chronic conditions and co-morbidity symptoms are monitored and maintained or improved  Outcome: Progressing     Problem: Discharge Planning  Goal: Discharge to home or other facility with appropriate resources  Outcome: Progressing     Problem: Safety - Medical Restraint  Goal: Remains free of injury from restraints (Restraint for Interference with Medical Device)  Description: INTERVENTIONS:  1. Determine that other, less restrictive measures have been tried or would not be effective before applying the restraint  2. Evaluate the patient's condition at the time of restraint application  3. Inform patient/family regarding the reason for restraint  4. Q2H: Monitor safety, psychosocial status, comfort, nutrition and hydration  Outcome: Progressing     Problem: Neurosensory - Adult  Goal: Achieves stable or improved neurological status  Outcome: Progressing  Goal: Absence of seizures  Outcome: Progressing  Goal: Achieves maximal functionality and self care  Outcome: Progressing     Problem: Respiratory - Adult  Goal: Achieves optimal ventilation and oxygenation  Outcome: Progressing     Problem: Cardiovascular - Adult  Goal: Maintains optimal cardiac output and hemodynamic stability  Outcome: Progressing  Goal: Absence of cardiac dysrhythmias or at baseline  Outcome: Progressing     Problem: Skin/Tissue Integrity - Adult  Goal: Skin integrity remains intact  Outcome: Progressing  Goal: Incisions, wounds, or drain sites healing without S/S of infection  Outcome: Progressing  Goal: Oral mucous membranes remain intact  Outcome: Progressing     Problem: Musculoskeletal - Adult  Goal: Return mobility to safest level of function  Outcome: Progressing  Goal: Maintain proper alignment of affected body part  Outcome: Progressing  Goal: Return ADL status to a safe level of function  Outcome: Progressing     Problem: Gastrointestinal - Adult  Goal: Minimal or  absence of nausea and vomiting  Outcome: Progressing  Goal: Maintains or returns to baseline bowel function  Outcome: Progressing  Goal: Maintains adequate nutritional intake  Outcome: Progressing  Goal: Establish and maintain optimal ostomy function  Outcome: Progressing     Problem: Genitourinary - Adult  Goal: Absence of urinary retention  Outcome: Progressing  Goal: Urinary catheter remains patent  Outcome: Progressing     Problem: Infection - Adult  Goal: Absence of infection at discharge  Outcome: Progressing  Goal: Absence of infection during hospitalization  Outcome: Progressing  Goal: Absence of fever/infection during anticipated neutropenic period  Outcome: Progressing     Problem: Metabolic/Fluid and Electrolytes - Adult  Goal: Electrolytes maintained within normal limits  Outcome: Progressing  Goal: Hemodynamic stability and optimal renal function maintained  Outcome: Progressing  Goal: Glucose maintained within prescribed range  Outcome: Progressing     "

## 2024-06-04 NOTE — Progress Notes (Addendum)
 "CRITICAL CARE PROGRESS NOTE    Name: Breanna Lester   DOB: [birth year]   MRN: [id number]   Date: 06/04/2024      Diagnoses/problem list:   Acute on chronic hypoxemic respiratory failure  Severe bilateral pneumonia  Acute kidney injury    24-hour events:   Remains intubated at the time of my exam this morning.  FiO2 is down to 35% and PEEP is at 8.  Still requiring Levophed  at 6 mcg/min.    Assessment and plan:   This is a 63 year old female with COPD on 2 L home O2, anxiety, depression, HFpEF, HLD, HTN, A-fib on Eliquis , NIDDM type II, pancreatitis and EtOH cirrhosis who presented to the ER on 10/14 with complaints of worsening shortness of breath after she got into a verbal altercation with someone at home.  She was severely hypoxic in the ER with increased work of breathing which did not improve with BiPAP and she was eventually intubated.  Imaging findings concerning for severe bilateral pneumonia.    NEUROLOGICAL:    Wean sedation for RASS goal 0 to -1  CT head without acute findings  Continue sertraline   SAT daily    PULMONOLOGY:   Severe bilateral pneumonia on CT, no PE  CAP antibiotics, see ID  Steroids for severe pneumonia  Lung protective ventilation  Resume Symbicort  and Spiriva  after extubation  SBT daily    CARDIOVASCULAR:   Levophed  to keep MAP above 65  Hypotension likely due to sedation  Mild troponin elevation is likely due to demand in the setting of hypoxia  TTE 05/05/2024 showed normal LVEF, diastolic function and normal RV function.  Moderate pericardial effusion noted at that time.  She does not have any signs of tamponade at this time    GASTROINTESTINAL:   Start tube feeds today  Bowel regimen as needed    RENAL/ELECTROLYTE:   AKI likely due to ATN from sepsis  Monitor UOP closely  Correct electrolytes    ENDOCRINE:   Keep BG 140-180  Low dose ISS    HEMATOLOGY/ONCOLOGY:   Eliquis  for Afib   Keep hemoglobin above 7    ID/MICRO:   UA with some pus cells and epithelial cells, likely  contaminated  Monitor for fevers and leukocytosis  Empiric ceftriaxone  and azithromycin  for CAP  Follow-up culture data  Flu and COVID PCR negative  MRSA PCR negative  Check urine strep and legionella Ag    ICU DAILY CHECKLIST     Code Status: Full  DVT Prophylaxis: Eliquis    T/L/D: PIV  SUP: PPI  Diet: Start TFs  Activity Level: Bed rest  ABCDEF Bundle/Checklist Completed: Yes  Disposition: Stay in ICU  Multidisciplinary Rounds Completed: Pending    NOK: I called and updated patient's daughter Harlene. Answered all of her questions. Patient has another daughter but she is estranged for last 20 years per Jessica.     OBJECTIVE:     Blood pressure (!) 124/46, pulse 69, temperature 98.5 F (36.9 C), temperature source Axillary, resp. rate 18, height 1.575 m (5' 2), weight 119.6 kg (263 lb 10.7 oz), SpO2 100%.  Physical Exam  Gen: Intubated  HEENT: NC/AT, supple, right eye cataract? Left pupil round and reactive   Cardiac: Regular rate and rhythm  Pulm: Bilateral rhonchi noted  ABD: Soft, non distended, non tender  Extremities: no significant edema  Neuro: Sedated    Labs and Data: Reviewed 06/04/24  Medications: Reviewed 06/04/24  Imaging: Reviewed 06/04/24    Intake/Output:  Intake/Output Summary (Last 24 hours) at 06/04/2024 1005  Last data filed at 06/04/2024 0749  Gross per 24 hour   Intake 956.35 ml   Output 200 ml   Net 756.35 ml       CRITICAL CARE DOCUMENTATION  I had a face to face encounter with the patient, reviewed and interpreted patient data including clinical events, labs, images, vital signs, I/O's, and examined patient.  I have discussed the case and the plan and management of the patient's care with the consulting services, the bedside nurses and the respiratory therapist.      NOTE OF PERSONAL INVOLVEMENT IN CARE   This patient has a high probability of imminent, clinically significant deterioration, which requires the highest level of preparedness to intervene urgently. I participated in the  decision-making and personally managed or directed the management of the following life and organ supporting interventions that required my frequent assessment to treat or prevent imminent deterioration.    I personally spent 45 minutes of critical care time.  This is time spent at this critically ill patient's bedside actively involved in patient care as well as the coordination of care.  This does not include any procedural time which has been billed separately.    Anders Evert, MD  Critical Care Medicine  [facility]    "

## 2024-06-04 NOTE — Progress Notes (Signed)
 "Comprehensive Nutrition Assessment    Type and Reason for Visit:  Initial, Nutrition support    Nutrition Recommendations/Plan:   Start TF via OGT with Vital HP @ 15 ml/hr+2 Prosource BID. Administer 200 ml free water  every 4 hours.  TF @ goal provides: 345 kcals, 30 g pro, and 288 ml free water   Prosource x4: 320 kcals, 80 g pro  Propofol  @ 50: 972 kcals  TOTAL: 1637 kcals (14 kcals/kg), 110 g pro (2.2 g/kg IBW), and 1488 ml H2O+drips  Monitor lytes closely and correct as needed  Monitor weight, fluid status, labs, and bowel fxn  Maintain head of bed at least 30-45o  Manage BG levels within target range 140-180     Malnutrition Assessment:  Malnutrition Status:  At risk for malnutrition (06/04/24 1141)    Context:  Acute Illness     Findings of the 6 clinical characteristics of malnutrition:  Energy Intake:  Unable to assess  Weight Loss:  Mild weight loss (-4% x 1 month)     Body Fat Loss:  No body fat loss     Muscle Mass Loss:  No muscle mass loss    Fluid Accumulation:  No fluid accumulation    Grip Strength:  Not Performed    Nutrition Assessment:     Pt admitted with pneumonia. Pt with continued respiratory distress on biPAP, intubated and admitted to ICU. MD started TF, RD to modify per discussion in IDRs. Pt reportedly housing insecure, staying in motels. Food insecurity noted on last admission. Recommend Vital HP@15ml /hr +2PS BID. Propofol  currently at 50 mcg/kg/min (36.8 mL/hr, 972 kcals/day). NE @ 5 mcg/min. Labs and meds reviewed. A1C 8.2%.     Nutrition Related Findings:      Wound Type: None     Last BM:  Not documented  Edema: None     Nutr. Labs:  Lab Results   Component Value Date    CREATININE 1.44 (H) 06/04/2024    BUN 28 (H) 06/04/2024    NA 139 06/04/2024    K 3.7 06/04/2024    CL 102 06/04/2024    CO2 24 06/04/2024       Lab Results   Component Value Date/Time    POCGLU 191 05/09/2024 04:40 PM    POCGLU 171 05/09/2024 02:09 PM    POCGLU 158 05/09/2024 11:38 AM    POCGLU 248 05/09/2024 08:40 AM     POCGLU 123 05/09/2024 01:03 AM    POCGLU 256 05/08/2024 08:05 PM        Hemoglobin A1C   Date Value Ref Range Status   04/24/2024 8.2 (H) 4.0 - 5.6 % Final     Comment:     Reference Range  Normal       <5.7%  Prediabetes  5.7-6.4%  Diabetes     >6.4%         Lab Results   Component Value Date/Time    MG 2.1 06/04/2024 03:15 AM       Lab Results   Component Value Date    CALCIUM  9.1 06/04/2024    PHOS 4.3 06/04/2024       Lab Results   Component Value Date    TRIG 127 04/15/2024       Nutr. Meds:  Scheduled Meds:   [Held by provider] bumetanide   1 mg IntraVENous Daily    [Held by provider] budesonide -formoterol   2 puff Inhalation BID RT    And    [Held by provider] tiotropium  2 puff  Inhalation Daily RT    pantoprazole   40 mg IntraVENous Daily    cefTRIAXone  (ROCEPHIN ) IV  2,000 mg IntraVENous Q24H    ipratropium 0.5 mg-albuterol  2.5 mg  1 Dose Inhalation 4x Daily RT    azithromycin   500 mg IntraVENous Q24H    midodrine   10 mg Oral q8h    methylPREDNISolone   40 mg IntraVENous Q12H    insulin  lispro  0-4 Units SubCUTAneous 4x Daily AC & HS    [START ON 06/05/2024] sertraline   100 mg Oral Daily    [START ON 06/05/2024] escitalopram   5 mg Oral Nightly    apixaban   5 mg Per NG tube BID    sodium chloride  flush  5-40 mL IntraVENous 2 times per day     Continuous Infusions:   norepinephrine  5 mcg/min (06/04/24 0915)    dextrose       propofol  50 mcg/kg/min (06/04/24 0927)    midazolam  5 mg/hr (06/04/24 0749)    sodium chloride  5 mL/hr at 06/04/24 0749     PRN Meds: glucose, dextrose  bolus **OR** dextrose  bolus, glucagon  (rDNA), dextrose , fentaNYL , sodium chloride  flush, sodium chloride , potassium chloride  **OR** potassium chloride , magnesium  sulfate, ondansetron  **OR** ondansetron , polyethylene glycol, acetaminophen  **OR** acetaminophen     Current Nutrition Intake & Therapies:    Average Meal Intake: NPO  Average Supplements Intake: NPO  Diet NPO  ADULT TUBE FEEDING; Nasogastric; Peptide Based High Protein; Continuous;  15; Yes; 10; Q 4 hours; 25; 150; Q 4 hours; Protein; 2 Doses; BID    Anthropometric Measures:  Height: 157.5 cm (5' 2)  Ideal Body Weight (IBW): 110 lbs (50 kg)    Current Body Weight: 119.6 kg (263 lb 10.7 oz), 239.7 % IBW. Weight Source: Bed scale  Current BMI (kg/m2): 48.2  Usual Body Weight: 125 kg (275 lb 9.2 oz) (September 2025)  % Weight Change (Calculated): -4.3  Weight Adjustment For: No Adjustment  BMI Categories: Obese Class 3 (BMI 40.0 or greater)    Estimated Daily Nutrient Needs:  Energy Requirements Based On: Formula  Weight Used for Energy Requirements: Current  Energy (kcal/day): 8684-8324 (11-14 kcals/kg)  Weight Used for Protein Requirements: Ideal  Protein (g/day): 100-125 (2-2.5 g/kg IBW)  Method Used for Fluid Requirements: ml/Kg  Fluid (ml/day): 1675-1915 (14-16 ml/kg)    Nutrition Diagnosis:   Inadequate oral intake related to impaired respiratory function as evidenced by NPO or clear liquid status due to medical condition, intubation    Nutrition Interventions:   Food and/or Nutrient Delivery: Start Tube Feeding  Nutrition Education/Counseling: No recommendation at this time  Coordination of Nutrition Care: Continue to monitor while inpatient     Goals:  Goals: Tolerate nutrition support at goal rate, by next RD assessment  Type of Goal: New goal  Previous Goal Met: New Goal    Nutrition Monitoring and Evaluation:   Behavioral-Environmental Outcomes: None Identified  Food/Nutrient Intake Outcomes: Progression of Nutrition, Enteral Nutrition Intake/Tolerance  Physical Signs/Symptoms Outcomes: Biochemical Data, GI Status, Weight, Hemodynamic Status    Discharge Planning:    Too soon to determine     Delon Gill, RD  Contact: [phone] or via [employer]    "

## 2024-06-04 NOTE — Progress Notes (Addendum)
"  PICC Line Insertion Procedure Note    Procedure: Insertion of #61fr PICC    Indications:  vescic    Procedure Details   Informed consent was obtained for the procedure.  Risks of hemorrhage, thrombus and adverse drug reaction were discussed. Vessel assessment revealed compressible vessels with no evidence of clot.     Right PICC placed without complication for patient.  2ml of Lidocaine  1% administered via infiltration prior to PICC insertion.  Time-Out performed at bedside with Starleen, RN.      #84fr double PICC inserted to the R Basilic vein per hospital protocol.   Blood return:  yes    Catheter length 42 cm, with 1 cm exposed. Post-procedure mid upper arm circumference is 45 cm.    Catheter was flushed with 30 cc NS. Patient did tolerate procedure well.  Pt sedated.    PICC tip confirmed in SVC with Teacher, Adult Education. Positive p wave without negative deflection noted on ECG.  ECG attached below.    Upon completion of procedure, all PICC kit components accounted for and intact. Post procedure hand-off given to Alvin, [facility].  PICC Brochure and card given to patient with teaching instruction. Bed returned to low locked position with call bell in reach.            "

## 2024-06-04 NOTE — Plan of Care (Signed)
 "  Problem: Chronic Conditions and Co-morbidities  Goal: Patient's chronic conditions and co-morbidity symptoms are monitored and maintained or improved  Outcome: Progressing     Problem: Discharge Planning  Goal: Discharge to home or other facility with appropriate resources  Outcome: Progressing     Problem: Safety - Medical Restraint  Goal: Remains free of injury from restraints (Restraint for Interference with Medical Device)  Description: INTERVENTIONS:  1. Determine that other, less restrictive measures have been tried or would not be effective before applying the restraint  2. Evaluate the patient's condition at the time of restraint application  3. Inform patient/family regarding the reason for restraint  4. Q2H: Monitor safety, psychosocial status, comfort, nutrition and hydration  Outcome: Progressing     Problem: Neurosensory - Adult  Goal: Achieves stable or improved neurological status  Outcome: Progressing  Goal: Absence of seizures  Outcome: Progressing  Goal: Achieves maximal functionality and self care  Outcome: Progressing     Problem: Respiratory - Adult  Goal: Achieves optimal ventilation and oxygenation  Outcome: Progressing     Problem: Cardiovascular - Adult  Goal: Maintains optimal cardiac output and hemodynamic stability  Outcome: Progressing  Goal: Absence of cardiac dysrhythmias or at baseline  Outcome: Progressing     Problem: Skin/Tissue Integrity - Adult  Goal: Skin integrity remains intact  Description: 1.  Monitor for areas of redness and/or skin breakdown  2.  Assess vascular access sites hourly  3.  Every 4-6 hours minimum:  Change oxygen saturation probe site  4.  Every 4-6 hours:  If on nasal continuous positive airway pressure, respiratory therapy assess nares and determine need for appliance change or resting period  Outcome: Progressing  Goal: Incisions, wounds, or drain sites healing without S/S of infection  Outcome: Progressing  Goal: Oral mucous membranes remain  intact  Outcome: Progressing     Problem: Musculoskeletal - Adult  Goal: Return mobility to safest level of function  Outcome: Progressing  Goal: Maintain proper alignment of affected body part  Outcome: Progressing  Goal: Return ADL status to a safe level of function  Outcome: Progressing     Problem: Gastrointestinal - Adult  Goal: Minimal or absence of nausea and vomiting  Outcome: Progressing  Goal: Maintains or returns to baseline bowel function  Outcome: Progressing  Goal: Maintains adequate nutritional intake  Outcome: Progressing  Goal: Establish and maintain optimal ostomy function  Outcome: Progressing     Problem: Genitourinary - Adult  Goal: Absence of urinary retention  Outcome: Progressing  Goal: Urinary catheter remains patent  Outcome: Progressing     Problem: Infection - Adult  Goal: Absence of infection at discharge  Outcome: Progressing  Goal: Absence of infection during hospitalization  Outcome: Progressing  Goal: Absence of fever/infection during anticipated neutropenic period  Outcome: Progressing     Problem: Metabolic/Fluid and Electrolytes - Adult  Goal: Electrolytes maintained within normal limits  Outcome: Progressing  Goal: Hemodynamic stability and optimal renal function maintained  Outcome: Progressing  Goal: Glucose maintained within prescribed range  Outcome: Progressing     Problem: Safety - Adult  Goal: Free from fall injury  Outcome: Progressing     Problem: Spiritual Care  Goal: Pt/Family able to move forward in process of forgiving self, others, and/or higher power  Description: INTERVENTIONS:  1. Assist patient/family to overcome blocks to healing by use of spiritual practices (prayer, meditation, guided imagery, reiki, breath work, catering manager).  2. De-myth guilt and help patient/family identify possible irrational spiritual/cultural  beliefs and values.  3. Explore possibilities of making amends & reconciliation with self, others, and/or a greater power.  4. Guide patient/family in  identifying painful feelings of guilt.  5. Help patient/famiy explore and identify spiritual beliefs, cultural understandings or values that may help or hinder letting go of issue.  6. Help patient/family explore feelings of anger, bitterness, resentment.  7. Help patient/family identify and examine the situation in which these feelings are experienced.  8. Help patient/family identify destructive displacement of feelings onto other individuals.  9. Invite use of sacraments/rituals/ceremonies as appropriate (e.g. - confession, anointing, smudging).  10. Refer patient/family to formal counseling and/or to faith community for further support work.  Outcome: Progressing     Problem: Pain  Goal: Verbalizes/displays adequate comfort level or baseline comfort level  Outcome: Progressing     Problem: Nutrition Deficit:  Goal: Optimize nutritional status  Outcome: Progressing     Problem: Skin/Tissue Integrity  Goal: Skin integrity remains intact  Description: 1.  Monitor for areas of redness and/or skin breakdown  2.  Assess vascular access sites hourly  3.  Every 4-6 hours minimum:  Change oxygen saturation probe site  4.  Every 4-6 hours:  If on nasal continuous positive airway pressure, respiratory therapy assess nares and determine need for appliance change or resting period  Outcome: Progressing     "

## 2024-06-04 NOTE — Progress Notes (Addendum)
"  Pulmonary Disease Navigator Note  Breanna Lester    Current GOLD classification for [employer] [place]  Patient's chart was reviewed by Pulmonary Disease Navigator for compliance with prescribed treatment with [employer] For Chronic Obstructive Lung Disease (GOLD).    Breanna Lester presents with history of anxiety, CHF, HLD, HTN, liver disease, COPD and most recently pneumonia.  Breanna Lester has home oxygen at 2 lpm nc, but due to housing instability, she does not have oxygen tanks, only a concentrator so if she has any appointments or outings she does not have an oxygen source.  Breanna Lester is currently intubated and sedated and unable to complete assessment at this time. Patient may benefit from medication assistance, food assistance and housing resource information once extubated. Will continue to follow as needs arise.   Time spent in review: 30 minutes  Electronically signed by Dickey Alisa Clause, RRT  "

## 2024-06-04 NOTE — Care Coordination (Addendum)
"  Pt is a readmit, Pt on the vent.  Previous admission Pt lived at [facility].   [address] [place], [place]. [address]    Per IDR  Pt stays at [employer] in [place] [address].     Pt has really bad Pneumonia.     If Pt has other children it might be good to engage them in the conservation with the doctor.     Pt is on the vent.     11:15  CM called Pt daughter, the mail box was full and could not leave a message.     11:20  Pt daughter called CM back.    Pt daughter stated that they have been going from hotel to hotel for about 2/12 years. Pt daughter stated that they have gotten all the resources and most of them they do not qualify for or cannot help them.     Pt daughter stated that Pt makes $1405 SS after they take out her Medicare payment. Pt daughter stated they get $400 in food stamps. Pt daughter stated that Pt has an insurance that pays for most of Pt medication.     Pt daughter stated that she is on Medical leave from [employer] because of her mom being so sick.     Pt daughter gave CM permission to send to [employer] to inquire if Pt may qualify for medicaid.             "

## 2024-06-04 NOTE — Plan of Care (Signed)
 "  Problem: Chronic Conditions and Co-morbidities  Goal: Patient's chronic conditions and co-morbidity symptoms are monitored and maintained or improved  06/04/2024 2023 by Zena Starleen Rubin, RN  Outcome: Progressing  06/04/2024 1843 by Tod Maus, RN  Outcome: Progressing     Problem: Discharge Planning  Goal: Discharge to home or other facility with appropriate resources  06/04/2024 2023 by Zena Starleen Rubin, RN  Outcome: Progressing  06/04/2024 1843 by Tod Maus, RN  Outcome: Progressing     Problem: Safety - Medical Restraint  Goal: Remains free of injury from restraints (Restraint for Interference with Medical Device)  Description: INTERVENTIONS:  1. Determine that other, less restrictive measures have been tried or would not be effective before applying the restraint  2. Evaluate the patient's condition at the time of restraint application  3. Inform patient/family regarding the reason for restraint  4. Q2H: Monitor safety, psychosocial status, comfort, nutrition and hydration  06/04/2024 2023 by Zena Starleen Rubin, RN  Outcome: Progressing  06/04/2024 1843 by Tod Maus, RN  Outcome: Progressing     Problem: Neurosensory - Adult  Goal: Achieves stable or improved neurological status  06/04/2024 2023 by Zena Starleen Rubin, RN  Outcome: Progressing  06/04/2024 1843 by Tod Maus, RN  Outcome: Progressing  Goal: Absence of seizures  06/04/2024 2023 by Zena Starleen Rubin, RN  Outcome: Progressing  06/04/2024 1843 by Tod Maus, RN  Outcome: Progressing  Goal: Achieves maximal functionality and self care  06/04/2024 2023 by Zena Starleen Rubin, RN  Outcome: Progressing  06/04/2024 1843 by Tod Maus, RN  Outcome: Progressing     Problem: Respiratory - Adult  Goal: Achieves optimal ventilation and oxygenation  06/04/2024 2023 by Zena Starleen Rubin, RN  Outcome: Progressing  06/04/2024 1843 by Tod Maus, RN  Outcome: Progressing     Problem: Cardiovascular - Adult  Goal:  Maintains optimal cardiac output and hemodynamic stability  06/04/2024 2023 by Zena Starleen Rubin, RN  Outcome: Progressing  06/04/2024 1843 by Tod Maus, RN  Outcome: Progressing  Goal: Absence of cardiac dysrhythmias or at baseline  06/04/2024 2023 by Zena Starleen Rubin, RN  Outcome: Progressing  06/04/2024 1843 by Tod Maus, RN  Outcome: Progressing     Problem: Skin/Tissue Integrity - Adult  Goal: Skin integrity remains intact  Description: 1.  Monitor for areas of redness and/or skin breakdown  2.  Assess vascular access sites hourly  3.  Every 4-6 hours minimum:  Change oxygen saturation probe site  4.  Every 4-6 hours:  If on nasal continuous positive airway pressure, respiratory therapy assess nares and determine need for appliance change or resting period  06/04/2024 2023 by Zena Starleen Rubin, RN  Outcome: Progressing  06/04/2024 1843 by Tod Maus, RN  Outcome: Progressing  Goal: Incisions, wounds, or drain sites healing without S/S of infection  06/04/2024 2023 by Zena Starleen Rubin, RN  Outcome: Progressing  06/04/2024 1843 by Tod Maus, RN  Outcome: Progressing  Goal: Oral mucous membranes remain intact  06/04/2024 2023 by Zena Starleen Rubin, RN  Outcome: Progressing  06/04/2024 1843 by Tod Maus, RN  Outcome: Progressing     Problem: Musculoskeletal - Adult  Goal: Return mobility to safest level of function  06/04/2024 2023 by Zena Starleen Rubin, RN  Outcome: Progressing  06/04/2024 1843 by Tod Maus, RN  Outcome: Progressing  Goal: Maintain proper alignment of affected body part  06/04/2024 2023 by Zena Starleen Rubin, RN  Outcome: Progressing  06/04/2024 1843 by Tod Maus, RN  Outcome: Progressing  Goal: Return ADL status to a safe level of function  06/04/2024 2023 by Zena Starleen Rubin, RN  Outcome: Progressing  06/04/2024 1843 by Tod Maus, RN  Outcome: Progressing     Problem: Gastrointestinal - Adult  Goal: Minimal or absence of nausea and  vomiting  06/04/2024 2023 by Zena Starleen Rubin, RN  Outcome: Progressing  06/04/2024 1843 by Tod Maus, RN  Outcome: Progressing  Goal: Maintains or returns to baseline bowel function  06/04/2024 2023 by Zena Starleen Rubin, RN  Outcome: Progressing  06/04/2024 1843 by Tod Maus, RN  Outcome: Progressing  Goal: Maintains adequate nutritional intake  06/04/2024 2023 by Zena Starleen Rubin, RN  Outcome: Progressing  06/04/2024 1843 by Tod Maus, RN  Outcome: Progressing  Goal: Establish and maintain optimal ostomy function  06/04/2024 2023 by Zena Starleen Rubin, RN  Outcome: Progressing  06/04/2024 1843 by Tod Maus, RN  Outcome: Progressing     Problem: Genitourinary - Adult  Goal: Absence of urinary retention  06/04/2024 2023 by Zena Starleen Rubin, RN  Outcome: Progressing  06/04/2024 1843 by Tod Maus, RN  Outcome: Progressing  Goal: Urinary catheter remains patent  06/04/2024 2023 by Zena Starleen Rubin, RN  Outcome: Progressing  06/04/2024 1843 by Tod Maus, RN  Outcome: Progressing     Problem: Infection - Adult  Goal: Absence of infection at discharge  06/04/2024 2023 by Zena Starleen Rubin, RN  Outcome: Progressing  06/04/2024 1843 by Tod Maus, RN  Outcome: Progressing  Goal: Absence of infection during hospitalization  06/04/2024 2023 by Zena Starleen Rubin, RN  Outcome: Progressing  06/04/2024 1843 by Tod Maus, RN  Outcome: Progressing  Goal: Absence of fever/infection during anticipated neutropenic period  06/04/2024 2023 by Zena Starleen Rubin, RN  Outcome: Progressing  06/04/2024 1843 by Tod Maus, RN  Outcome: Progressing     Problem: Metabolic/Fluid and Electrolytes - Adult  Goal: Electrolytes maintained within normal limits  06/04/2024 2023 by Zena Starleen Rubin, RN  Outcome: Progressing  06/04/2024 1843 by Tod Maus, RN  Outcome: Progressing  Goal: Hemodynamic stability and optimal renal function maintained  06/04/2024 2023 by Zena Starleen Rubin, RN  Outcome: Progressing  06/04/2024 1843 by Tod Maus, RN  Outcome: Progressing  Goal: Glucose maintained within prescribed range  06/04/2024 2023 by Zena Starleen Rubin, RN  Outcome: Progressing  06/04/2024 1843 by Tod Maus, RN  Outcome: Progressing     Problem: Safety - Adult  Goal: Free from fall injury  06/04/2024 2023 by Zena Starleen Rubin, RN  Outcome: Progressing  06/04/2024 1843 by Tod Maus, RN  Outcome: Progressing     Problem: Spiritual Care  Goal: Pt/Family able to move forward in process of forgiving self, others, and/or higher power  Description: INTERVENTIONS:  1. Assist patient/family to overcome blocks to healing by use of spiritual practices (prayer, meditation, guided imagery, reiki, breath work, catering manager).  2. De-myth guilt and help patient/family identify possible irrational spiritual/cultural beliefs and values.  3. Explore possibilities of making amends & reconciliation with self, others, and/or a greater power.  4. Guide patient/family in identifying painful feelings of guilt.  5. Help patient/famiy explore and identify spiritual beliefs, cultural understandings or values that may help or hinder letting go of issue.  6. Help patient/family explore feelings of anger, bitterness, resentment.  7. Help patient/family identify and examine the situation in which these feelings are experienced.  8. Help patient/family identify destructive displacement of feelings onto other individuals.  9. Invite use of sacraments/rituals/ceremonies  as appropriate (e.g. - confession, anointing, smudging).  10. Refer patient/family to formal counseling and/or to faith community for further support work.  06/04/2024 2023 by Zena Starleen Rubin, RN  Outcome: Progressing  06/04/2024 1843 by Tod Maus, RN  Outcome: Progressing     Problem: Pain  Goal: Verbalizes/displays adequate comfort level or baseline comfort level  06/04/2024 2023 by Zena Starleen Rubin, RN  Outcome:  Progressing  06/04/2024 1843 by Tod Maus, RN  Outcome: Progressing     Problem: Nutrition Deficit:  Goal: Optimize nutritional status  06/04/2024 2023 by Zena Starleen Rubin, RN  Outcome: Progressing  06/04/2024 1843 by Tod Maus, RN  Outcome: Progressing     Problem: Skin/Tissue Integrity  Goal: Skin integrity remains intact  Description: 1.  Monitor for areas of redness and/or skin breakdown  2.  Assess vascular access sites hourly  3.  Every 4-6 hours minimum:  Change oxygen saturation probe site  4.  Every 4-6 hours:  If on nasal continuous positive airway pressure, respiratory therapy assess nares and determine need for appliance change or resting period  06/04/2024 2023 by Zena Starleen Rubin, RN  Outcome: Progressing  06/04/2024 1843 by Tod Maus, RN  Outcome: Progressing     "

## 2024-06-04 NOTE — Care Coordination (Signed)
 "   06/04/24 1010   Service Assessment   Information Provided By Child/Family   Patient Orientation Sedated   Cognition Other (see comment)  (Vented/Sedated)   Primary Caregiver Self   Support Systems Children   Patient's Healthcare Decision Maker is: Legal Next of Kin   PCP Verified by CM No PCP   Last Visit to PCP Within last two years   Prior Functional Level Independent in ADLs/IADLs   Current Functional Level at Time of Initial Assessment Dependent   Can patient return to prior living arrangement Yes   Ability to make needs known: Other (see comment)  (Vented/Sedated)   Family able to assist with home care needs: Yes   Discharge Planning    Location Prior to Acute Admission Other (Comment)  [facility])   Lives With Daughter   Patient expects to be discharged to: Other (comment)  (Hotel)   Social/Functional History   Home Equipment Oxygen     Readmission Assessment  Number of Days since last admission?: 8-30 days  Previous Disposition: Home with Family  What was the patient's/caregiver's perception as to why they think they needed to return back to the hospital?: Worsening of symptoms/Unexpected complications  Did you visit your Primary Care Physician after you left the hospital, before you returned this time?: No  Why weren't you able to visit your PCP?: Had no transportation, Did not have an appointment  Did you see a specialist, such as Cardiac, Pulmonary, Orthopedic Physician, etc. after you left the hospital?: No  Who advised the patient to return to the hospital?: Self-referral  Does the patient report anything that got in the way of taking their medications?: No  What could we have done to help prevent your return to the hospital?: Additional Community resources available for illness support, Arrange for more help when leaving the hospital  Have You Received a Follow Up Phone Call from a HealthCare Provider After Discharge?: Other (comment) (Unknown)    CM made visit to room: patient vented.    CM telephoned  patient's daughter, Breanna Lester [phone] to complete DCP readmission assessment. Breanna Lester shared she and her mother were at [employer], then [employer] and currently at [facility] [place]: [address] [phone]. She reports the address on the demos is no longer valid and she does not have another mailing address to provide at this time. Breanna Lester explained she and her mother have had to move around as they do not have the money to continue their stays, nor do they have transportation. Prior to admission, she reports her mother being independent with ADLS and receiving assistance to get out of the shower. DME: nebulizer and oxygen concentrator through [employer], she believes. She reports her mother wears oxygen 24/7; however, they turned her portable tanks in. Patient does not have a PCP and therefore home health has not been secured in the past. Preferred pharmacy for new medications stated as [facility] in [place]: system updated. When medically cleared for discharge, Breanna Lester reports her mother will need a cab for transportation.    Current Dispo: Home pending patient status.    Advance Care Planning     General Advance Care Planning (ACP) Conversation    Date of Conversation: 06/04/2024  Conducted with: Legal next of kin  Other persons present: None    Healthcare Decision Maker:   Primary Decision Maker: Breanna Lester,Breanna Lester - Child - [phone]       Content/Action Overview:  Has NO ACP  documents-Information provided  Reviewed DNR/DNI and patient elects Full Code (Attempt Resuscitation)        Length of Voluntary ACP Conversation in minutes:  <16 minutes (Non-Billable)    Breanna Lester          "

## 2024-06-05 ENCOUNTER — Inpatient Hospital Stay: Admit: 2024-06-05 | Payer: Medicare (Managed Care) | Primary: Diagnostic Radiology

## 2024-06-05 LAB — CULTURE, BLOOD, PCR ID PANEL RESULTS REPORT
Acinetobacter calcoac baumannii complex by PCR: NOT DETECTED
Bacteroides fragilis by PCR: NOT DETECTED
Biofire test comment: POSITIVE
Candida albicans by PCR: NOT DETECTED
Candida auris by PCR: NOT DETECTED
Candida glabrata: NOT DETECTED
Candida krusei by PCR: NOT DETECTED
Candida parapsilosis by PCR: NOT DETECTED
Candida tropicalis by PCR: NOT DETECTED
Cryptococcus neoformans/gattii by PCR: NOT DETECTED
Enterobacter cloacae complex by PCR: NOT DETECTED
Enterobacteriaceae by PCR: NOT DETECTED
Enterococcus faecalis by PCR: NOT DETECTED
Enterococcus faecium by PCR: NOT DETECTED
Escherichia Coli: NOT DETECTED
Haemophilus Influenzae by PCR: NOT DETECTED
Klebsiella aerogenes by PCR: NOT DETECTED
Klebsiella oxytoca by PCR: NOT DETECTED
Klebsiella pneumoniae group by PCR: NOT DETECTED
Listeria monocytogenes by PCR: NOT DETECTED
Methicillin Resistance mecA/C  by PCR: DETECTED — AB
Neisseria meningitidis by PCR: NOT DETECTED
Proteus by PCR: NOT DETECTED
Pseudomonas aeruginosa: NOT DETECTED
STAPHYLOCOCCUS: DETECTED — AB
STREPTOCOCCUS: NOT DETECTED
Salmonella species by PCR: NOT DETECTED
Serratia marcescens by PCR: NOT DETECTED
Staphylococcus Aureus: NOT DETECTED
Staphylococcus epidermidis by PCR: DETECTED — AB
Staphylococcus lugdunensis by PCR: NOT DETECTED
Stenotrophomonas maltophilia by PCR: NOT DETECTED
Strep pneumoniae: NOT DETECTED
Strep pyogenes,(Grp. A): NOT DETECTED
Streptococcus agalactiae (Group B): NOT DETECTED

## 2024-06-05 LAB — CBC
Hematocrit: 34.2 % — ABNORMAL LOW (ref 35.0–47.0)
Hemoglobin: 10.2 g/dL — ABNORMAL LOW (ref 11.5–16.0)
MCH: 25.5 pg — ABNORMAL LOW (ref 26.0–34.0)
MCHC: 29.8 g/dL — ABNORMAL LOW (ref 30.0–36.5)
MCV: 85.5 FL (ref 80.0–99.0)
MPV: 11 FL (ref 8.9–12.9)
Nucleated RBCs: 0.2 /100{WBCs} — ABNORMAL HIGH
Platelets: 213 K/uL (ref 150–400)
RBC: 4 M/uL (ref 3.80–5.20)
RDW: 18.1 % — ABNORMAL HIGH (ref 11.5–14.5)
WBC: 9 K/uL (ref 3.6–11.0)
nRBC: 0.02 K/uL — ABNORMAL HIGH (ref 0.00–0.01)

## 2024-06-05 LAB — POCT GLUCOSE
POC Glucose: 201 mg/dL — ABNORMAL HIGH (ref 65–100)
POC Glucose: 233 mg/dL — ABNORMAL HIGH (ref 65–100)
POC Glucose: 231 mg/dL — ABNORMAL HIGH (ref 65–100)
POC Glucose: 275 mg/dL — ABNORMAL HIGH (ref 65–100)

## 2024-06-05 LAB — BASIC METABOLIC PANEL
Anion Gap: 12 mmol/L (ref 2–14)
BUN/Creatinine Ratio: 32 — ABNORMAL HIGH (ref 12–20)
BUN: 31 mg/dL — ABNORMAL HIGH (ref 8–23)
CO2: 24 mmol/L (ref 20–29)
Calcium: 8.8 mg/dL (ref 8.8–10.2)
Chloride: 102 mmol/L (ref 98–107)
Creatinine: 0.97 mg/dL (ref 0.60–1.00)
Est, Glom Filt Rate: 66 ml/min/1.73m2 (ref >59)
Glucose: 238 mg/dL — ABNORMAL HIGH (ref 65–100)
Potassium: 3.6 mmol/L (ref 3.5–5.1)
Sodium: 137 mmol/L (ref 136–145)

## 2024-06-05 LAB — EKG 12-LEAD
Atrial Rate: 79 {beats}/min
Diagnosis: NORMAL
P Axis: 55 degrees
P-R Interval: 162 ms
Q-T Interval: 390 ms
QRS Duration: 78 ms
QTc Calculation (Bazett): 444 ms
R Axis: 78 degrees
T Axis: 137 degrees
Ventricular Rate: 78 {beats}/min

## 2024-06-05 LAB — PHOSPHORUS: Phosphorus: 2.7 mg/dL (ref 2.5–4.5)

## 2024-06-05 LAB — MAGNESIUM: Magnesium: 2.2 mg/dL (ref 1.6–2.4)

## 2024-06-05 LAB — CULTURE, URINE: Culture: NO GROWTH

## 2024-06-05 MED ORDER — INSULIN LISPRO 100 UNIT/ML IJ SOLN
100 | Freq: Four times a day (QID) | INTRAMUSCULAR | Status: DC
Start: 2024-06-05 — End: 2024-06-06
  Administered 2024-06-05: 16:00:00 2 [IU] via SUBCUTANEOUS
  Administered 2024-06-05 – 2024-06-06 (×3): 4 [IU] via SUBCUTANEOUS

## 2024-06-05 MED ORDER — SODIUM CHLORIDE 0.9 % IV SOLN
0.9 | INTRAVENOUS | Status: DC | PRN
Start: 2024-06-05 — End: 2024-06-16

## 2024-06-05 MED ORDER — IPRATROPIUM-ALBUTEROL 0.5-2.5 (3) MG/3ML IN SOLN
0.5-2.5 | Freq: Two times a day (BID) | RESPIRATORY_TRACT | Status: DC
Start: 2024-06-05 — End: 2024-06-06
  Administered 2024-06-06 (×2): 1 via RESPIRATORY_TRACT

## 2024-06-05 MED ORDER — LIDOCAINE HCL 1 % IJ SOLN
1 | Freq: Once | INTRAMUSCULAR | Status: DC
Start: 2024-06-05 — End: 2024-06-16

## 2024-06-05 MED ORDER — NORMAL SALINE FLUSH 0.9 % IV SOLN
0.9 | INTRAVENOUS | Status: DC | PRN
Start: 2024-06-05 — End: 2024-06-16

## 2024-06-05 MED ORDER — DEXMEDETOMIDINE HCL IN NACL 400 MCG/100ML IV SOLN
400 | INTRAVENOUS | Status: DC
Start: 2024-06-05 — End: 2024-06-06
  Administered 2024-06-05: 16:00:00 0.2 ug/kg/h via INTRAVENOUS
  Administered 2024-06-05 – 2024-06-06 (×2): 0.6 ug/kg/h via INTRAVENOUS

## 2024-06-05 MED ORDER — INSULIN GLARGINE 100 UNIT/ML SC SOLN
100 | Freq: Every day | SUBCUTANEOUS | Status: DC
Start: 2024-06-05 — End: 2024-06-06
  Administered 2024-06-05: 15:00:00 5 [IU] via SUBCUTANEOUS

## 2024-06-05 MED ORDER — NORMAL SALINE FLUSH 0.9 % IV SOLN
0.9 | Freq: Two times a day (BID) | INTRAVENOUS | Status: DC
Start: 2024-06-05 — End: 2024-06-16
  Administered 2024-06-05 – 2024-06-16 (×21): 10 mL via INTRAVENOUS

## 2024-06-05 MED ORDER — ALTEPLASE 2 MG IJ SOLR
2 | INTRAMUSCULAR | Status: DC | PRN
Start: 2024-06-05 — End: 2024-06-16

## 2024-06-05 MED FILL — INSULIN LISPRO 100 UNIT/ML IJ SOLN: 100 [IU]/mL | INTRAMUSCULAR | Qty: 1

## 2024-06-05 MED FILL — INSULIN LISPRO 100 UNIT/ML IJ SOLN: 100 [IU]/mL | INTRAMUSCULAR | Qty: 2

## 2024-06-05 MED FILL — INSULIN LISPRO 100 UNIT/ML IJ SOLN: 100 [IU]/mL | INTRAMUSCULAR | Qty: 4

## 2024-06-05 MED FILL — PANTOPRAZOLE SODIUM 40 MG IV SOLR: 40 mg | INTRAVENOUS | Qty: 40

## 2024-06-05 MED FILL — LANTUS 100 UNIT/ML SC SOLN: 100 [IU]/mL | SUBCUTANEOUS | Qty: 5

## 2024-06-05 MED FILL — DIPRIVAN 1000 MG/100ML IV EMUL: 1000 MG/100ML | INTRAVENOUS | Qty: 100

## 2024-06-05 MED FILL — CEFTRIAXONE SODIUM 2 G IJ SOLR: 2 g | INTRAMUSCULAR | Qty: 2000

## 2024-06-05 MED FILL — MIDODRINE HCL 5 MG PO TABS: 5 mg | ORAL | Qty: 2

## 2024-06-05 MED FILL — IPRATROPIUM-ALBUTEROL 0.5-2.5 (3) MG/3ML IN SOLN: 0.5-2.5 (3) MG/3ML | RESPIRATORY_TRACT | Qty: 3

## 2024-06-05 MED FILL — FENTANYL CITRATE (PF) 100 MCG/2ML IJ SOLN: 100 MCG/2ML | INTRAMUSCULAR | Qty: 2

## 2024-06-05 MED FILL — ELIQUIS 5 MG PO TABS: 5 mg | ORAL | Qty: 1

## 2024-06-05 MED FILL — METHYLPREDNISOLONE SODIUM SUCC 40 MG IJ SOLR: 40 mg | INTRAMUSCULAR | Qty: 40

## 2024-06-05 MED FILL — SERTRALINE HCL 50 MG PO TABS: 50 mg | ORAL | Qty: 2

## 2024-06-05 MED FILL — DEXMEDETOMIDINE HCL IN NACL 400 MCG/100ML IV SOLN: 400 MCG/100ML | INTRAVENOUS | Qty: 100

## 2024-06-05 MED FILL — AZITHROMYCIN 500 MG IV SOLR: 500 mg | INTRAVENOUS | Qty: 500

## 2024-06-05 MED FILL — STERILE WATER FOR INJECTION IJ SOLN: INTRAMUSCULAR | Qty: 20

## 2024-06-05 MED FILL — SODIUM CHLORIDE FLUSH 0.9 % IV SOLN: 0.9 % | INTRAVENOUS | Qty: 40

## 2024-06-05 NOTE — Progress Notes (Addendum)
"  Spiritual Health Progress Note  [place]      Room # 208/01    Name: Breanna Lester           Age: 63 y.o.    Gender: female          MRN: [id number]  Religion: Baptist       Preferred Language: English      Date: 06/05/24  Visit Time: Begin Time: 1030 End Time : 1035         Visit Summary: Pt was not available to speak during visit. Chaplain provide ministry present and support to Pt's Spiritual and emotions at that time.       Referral/Consult From: Nurse  Encounter Overview/Reason: Initial Encounter  Encounter Code:     Crisis (if applicable):    Service Provided For: Patient     Patient was available.    Faith, Belief, Meaning:   Patient unable to assess at this time  Family/Friends No family/friends present  Rituals (if applicable)      Importance and Influence:  Patient unable to assess at this time  Family/Friends No family/friends present    Community:  Patient   unable to assess at this time   Family/Friends   unable to assess at this time     Assessment and Plan of Care:   Emotions Expressed by Patient:   Assessment: Unable to assess    Interventions by Chaplain:   Intervention: Sustaining Presence/Ministry of presence     Result/ Response by Patient:   Outcome: Other (comment) (Unable to assess)    Patient Plan of Care:   Plan and Referrals  Plan/Referrals: Other (Comment) (Chaplain referral as need)     Emotions Expressed by Spouse/Family/Friends:   No family/ friends present when chaplain visited.    Chaplain Interventions with Spouse/ Family/Friends include:   No family present    Spouse/Family/Friends Plan of Care:   No family present      Electronically signed by Elia Richardson Cramp, Th.M on 06/05/2024 at 11:25 AM  Spiritual Health   [facility]/ [place]   Please call the operator at [phone] to paged the Chaplain  "

## 2024-06-05 NOTE — Care Coordination (Addendum)
"  Pt lives with her daughter at [facility], [address], [place]    Pt and her daughter have been going from hotel to hotel for 21/2 years.     Pt remains on the vent.     Per IDR    Pt admitted with pneumonia.       "

## 2024-06-05 NOTE — Progress Notes (Signed)
 "CRITICAL CARE PROGRESS NOTE    Name: Breanna Lester   DOB: 03-25-1961   MRN: [id number]   Date: 06/05/2024      Diagnoses/problem list:   Acute on chronic hypoxemic respiratory failure  Severe bilateral pneumonia  Acute kidney injury    24-hour events:     10/16: Remains intubated, 35% FiO2 and PEEP of 8. No new complaints this morning. CXR with persistent bilateral infiltrates. Will try SAT and SBT today.     Assessment and plan:   This is a 63 year old female with COPD on 2 L home O2, anxiety, depression, HFpEF, HLD, HTN, A-fib on Eliquis , NIDDM type II, pancreatitis and EtOH cirrhosis who presented to the ER on 10/14 with complaints of worsening shortness of breath after she got into a verbal altercation with someone at home.  She was severely hypoxic in the ER with increased work of breathing which did not improve with BiPAP and she was eventually intubated.  Imaging findings concerning for severe bilateral pneumonia.    NEUROLOGICAL:    Wean sedation for RASS goal 0 to -1  CT head without acute findings  Continue sertraline   SAT daily    PULMONOLOGY:   Severe bilateral pneumonia on CT, no PE  CAP antibiotics, see ID  Steroids for severe pneumonia  Lung protective ventilation  Resume Symbicort  and Spiriva  after extubation  SBT daily    CARDIOVASCULAR:   Levophed  to keep MAP above 65  Hypotension likely due to sedation, improving   Mild troponin elevation is likely due to demand in the setting of hypoxia  TTE 05/05/2024 showed normal LVEF, diastolic function and normal RV function.  Moderate pericardial effusion noted at that time.  She does not have any signs of tamponade at this time    GASTROINTESTINAL:   Continue tube feeds   Bowel regimen as needed    RENAL/ELECTROLYTE:   AKI likely due to ATN from sepsis  Monitor UOP closely  Correct electrolytes    ENDOCRINE:   Keep BG 140-180  Medium dose ISS    HEMATOLOGY/ONCOLOGY:   Eliquis  for Afib   Keep hemoglobin above 7    ID/MICRO:   UA with some pus cells and  epithelial cells, likely contaminated  Monitor for fevers and leukocytosis  Empiric ceftriaxone  and azithromycin  for CAP  Follow-up culture data  Flu and COVID PCR negative  MRSA PCR negative  Follow-up urine strep and legionella Ag    ICU DAILY CHECKLIST     Code Status: Full  DVT Prophylaxis: Eliquis    T/L/D: PIV  SUP: PPI  Diet: TFs  Activity Level: Bed rest  ABCDEF Bundle/Checklist Completed: Yes  Disposition: Stay in ICU  Multidisciplinary Rounds Completed: Yes    OBJECTIVE:     Blood pressure (!) 124/52, pulse 66, temperature 98.9 F (37.2 C), temperature source Axillary, resp. rate 18, height 1.575 m (5' 2), weight 120.8 kg (266 lb 5.1 oz), SpO2 100%.  Physical Exam  Gen: Intubated  HEENT: NC/AT, supple, right eye cataract? Left pupil round and reactive   Cardiac: Regular rate and rhythm  Pulm: Bilateral rhonchi noted  ABD: Soft, non distended, non tender  Extremities: no significant edema  Neuro: Sedated    Labs and Data: Reviewed 06/05/24  Medications: Reviewed 06/05/24  Imaging: Reviewed 06/05/24    Intake/Output:     Intake/Output Summary (Last 24 hours) at 06/05/2024 0914  Last data filed at 06/05/2024 0630  Gross per 24 hour   Intake 2457.88 ml   Output  1198 ml   Net 1259.88 ml       CRITICAL CARE DOCUMENTATION  I had a face to face encounter with the patient, reviewed and interpreted patient data including clinical events, labs, images, vital signs, I/O's, and examined patient.  I have discussed the case and the plan and management of the patient's care with the consulting services, the bedside nurses and the respiratory therapist.      NOTE OF PERSONAL INVOLVEMENT IN CARE   This patient has a high probability of imminent, clinically significant deterioration, which requires the highest level of preparedness to intervene urgently. I participated in the decision-making and personally managed or directed the management of the following life and organ supporting interventions that required my frequent  assessment to treat or prevent imminent deterioration.    I personally spent 45 minutes of critical care time.  This is time spent at this critically ill patient's bedside actively involved in patient care as well as the coordination of care.  This does not include any procedural time which has been billed separately.    Anders Evert, MD  Critical Care Medicine  [facility]    "

## 2024-06-05 NOTE — Progress Notes (Signed)
"  RT Nebulizer Bronchodilator Protocol Note    There is a bronchodilator order in the chart from a provider indicating to follow the RT Bronchodilator Protocol and there is an Initiate RT Bronchodilator Protocol order as well (see protocol at bottom of note).    CXR Findings:  XR CHEST PORTABLE  Result Date: 06/05/2024  1. No change bilateral pneumonia Electronically signed by Evalene JINNY Hoit    XR CHEST PORTABLE  Result Date: 06/03/2024  1. Enlarged cardiac silhouette, worsening airspace disease or pulmonary edema. No complicating features post intubation Electronically signed by Susie Brain    XR CHEST PORTABLE  Result Date: 06/03/2024  Cardiomegaly and mild interstitial pulmonary edema. Electronically signed by JIMMY HABIB      The findings from the last RT Protocol Assessment were as follows:  Smoking: None or smoker <15 pack years  Respiratory Pattern: Regular pattern and RR 12-20 bpm  Breath Sounds: Slightly diminished and/or crackles  Cough: Strong, productive  Indication for Bronchodilator Therapy: On home bronchodilators  Bronchodilator Assessment Score: 3    Aerosolized bronchodilator medication orders have been revised according to the RT Nebulizer Bronchodilator Protocol below.    Respiratory Therapist to perform RT Therapy Protocol Assessment initially then follow the protocol.  Repeat RT Therapy Protocol Assessment PRN for score 0-3 or on second treatment, BID, and PRN for scores above 3.    No Indications - adjust the frequency to every 6 hours PRN wheezing or bronchospasm, if no treatments needed after 48 hours then discontinue using Per Protocol order mode.     If indication present, adjust the RT bronchodilator orders based on the Bronchodilator Assessment Score as indicated below.  If a patient is on this medication at home then do not decrease Frequency below that used at home.    0-3 - enter or revise RT bronchodilator order(s) to equivalent RT Bronchodilator order with Frequency of every 4  hours PRN for wheezing or increased work of breathing using Per Protocol order mode.       4-6 - enter or revise RT Bronchodilator order(s) to two equivalent RT bronchodilator orders with one order with BID Frequency and one order with Frequency of every 4 hours PRN wheezing or increased work of breathing using Per Protocol order mode.         7-10 - enter or revise RT Bronchodilator order(s) to two equivalent RT bronchodilator orders with one order with TID Frequency and one order with Frequency of every 4 hours PRN wheezing or increased work of breathing using Per Protocol order mode.       11-13 - enter or revise RT Bronchodilator order(s) to one equivalent RT bronchodilator order with QID Frequency and an Albuterol  order with Frequency of every 4 hours PRN wheezing or increased work of breathing using Per Protocol order mode.      Greater than 13 - enter or revise RT Bronchodilator order(s) to one equivalent RT bronchodilator order with every 4 hours Frequency and an Albuterol  order with Frequency of every 2 hours PRN wheezing or increased work of breathing using Per Protocol order mode.     RT to enter RT Home Evaluation for COPD & MDI Assessment order using Per Protocol order mode.    Electronically signed by Shelli SAUNDERS Nelani Schmelzle, RT on 06/05/2024 at 11:53 AM  "

## 2024-06-05 NOTE — Plan of Care (Signed)
"    Problem: Chronic Conditions and Co-morbidities  Goal: Patient's chronic conditions and co-morbidity symptoms are monitored and maintained or improved  06/05/2024 2212 by Leverne Herring, RN  Outcome: Progressing  Flowsheets (Taken 06/05/2024 1945)  Care Plan - Patient's Chronic Conditions and Co-Morbidity Symptoms are Monitored and Maintained or Improved:   Monitor and assess patient's chronic conditions and comorbid symptoms for stability, deterioration, or improvement   Collaborate with multidisciplinary team to address chronic and comorbid conditions and prevent exacerbation or deterioration  06/05/2024 1019 by Gershon Shams, RN  Outcome: Progressing  Flowsheets (Taken 06/05/2024 0800)  Care Plan - Patient's Chronic Conditions and Co-Morbidity Symptoms are Monitored and Maintained or Improved:   Monitor and assess patient's chronic conditions and comorbid symptoms for stability, deterioration, or improvement   Collaborate with multidisciplinary team to address chronic and comorbid conditions and prevent exacerbation or deterioration     Problem: Discharge Planning  Goal: Discharge to home or other facility with appropriate resources  Recent Flowsheet Documentation  Taken 06/05/2024 1945 by Leverne Herring, RN  Discharge to home or other facility with appropriate resources:   Identify barriers to discharge with patient and caregiver   Arrange for needed discharge resources and transportation as appropriate   Identify discharge learning needs (meds, wound care, etc)  06/05/2024 1019 by Gershon Shams, RN  Outcome: Progressing  Flowsheets (Taken 06/05/2024 0800)  Discharge to home or other facility with appropriate resources:   Identify barriers to discharge with patient and caregiver   Arrange for needed discharge resources and transportation as appropriate   Identify discharge learning needs (meds, wound care, etc)     Problem: Safety - Medical Restraint  Goal: Remains free of injury from restraints (Restraint for  Interference with Medical Device)  Description: INTERVENTIONS:  1. Determine that other, less restrictive measures have been tried or would not be effective before applying the restraint  2. Evaluate the patient's condition at the time of restraint application  3. Inform patient/family regarding the reason for restraint  4. Q2H: Monitor safety, psychosocial status, comfort, nutrition and hydration  06/05/2024 2212 by Leverne Herring, RN  Outcome: Progressing  06/05/2024 1019 by Gershon Shams, RN  Outcome: Progressing     Problem: Neurosensory - Adult  Goal: Achieves stable or improved neurological status  06/05/2024 2212 by Leverne Herring, RN  Outcome: Progressing  Flowsheets (Taken 06/05/2024 1945)  Achieves stable or improved neurological status: Assess for and report changes in neurological status  06/05/2024 1019 by Gershon Shams, RN  Outcome: Progressing  Flowsheets (Taken 06/05/2024 0800)  Achieves stable or improved neurological status:   Assess for and report changes in neurological status   Initiate measures to prevent increased intracranial pressure     Problem: Respiratory - Adult  Goal: Achieves optimal ventilation and oxygenation  06/05/2024 2212 by Leverne Herring, RN  Outcome: Progressing  Flowsheets (Taken 06/05/2024 1945)  Achieves optimal ventilation and oxygenation:   Assess for changes in respiratory status   Oxygen supplementation based on oxygen saturation or arterial blood gases  06/05/2024 1019 by Gershon Shams, RN  Outcome: Progressing  Flowsheets (Taken 06/05/2024 0800)  Achieves optimal ventilation and oxygenation:   Assess for changes in respiratory status   Assess for changes in mentation and behavior   Position to facilitate oxygenation and minimize respiratory effort     "

## 2024-06-05 NOTE — Plan of Care (Signed)
 "  Problem: Chronic Conditions and Co-morbidities  Goal: Patient's chronic conditions and co-morbidity symptoms are monitored and maintained or improved  06/05/2024 1019 by Gershon Shams, RN  Outcome: Progressing  Flowsheets (Taken 06/05/2024 0800)  Care Plan - Patient's Chronic Conditions and Co-Morbidity Symptoms are Monitored and Maintained or Improved:   Monitor and assess patient's chronic conditions and comorbid symptoms for stability, deterioration, or improvement   Collaborate with multidisciplinary team to address chronic and comorbid conditions and prevent exacerbation or deterioration  06/04/2024 2023 by Zena Starleen Rubin, RN  Outcome: Progressing  Flowsheets (Taken 06/04/2024 2000)  Care Plan - Patient's Chronic Conditions and Co-Morbidity Symptoms are Monitored and Maintained or Improved: Monitor and assess patient's chronic conditions and comorbid symptoms for stability, deterioration, or improvement     Problem: Discharge Planning  Goal: Discharge to home or other facility with appropriate resources  06/05/2024 1019 by Gershon Shams, RN  Outcome: Progressing  Flowsheets (Taken 06/05/2024 0800)  Discharge to home or other facility with appropriate resources:   Identify barriers to discharge with patient and caregiver   Arrange for needed discharge resources and transportation as appropriate   Identify discharge learning needs (meds, wound care, etc)  06/04/2024 2023 by Zena Starleen Rubin, RN  Outcome: Progressing  Flowsheets (Taken 06/04/2024 2000)  Discharge to home or other facility with appropriate resources: Identify barriers to discharge with patient and caregiver     Problem: Safety - Medical Restraint  Goal: Remains free of injury from restraints (Restraint for Interference with Medical Device)  Description: INTERVENTIONS:  1. Determine that other, less restrictive measures have been tried or would not be effective before applying the restraint  2. Evaluate the patient's condition at the time of  restraint application  3. Inform patient/family regarding the reason for restraint  4. Q2H: Monitor safety, psychosocial status, comfort, nutrition and hydration  06/05/2024 1019 by Gershon Shams, RN  Outcome: Progressing  06/04/2024 2023 by Zena Starleen Rubin, RN  Outcome: Progressing     Problem: Neurosensory - Adult  Goal: Achieves stable or improved neurological status  06/05/2024 1019 by Gershon Shams, RN  Outcome: Progressing  Flowsheets (Taken 06/05/2024 0800)  Achieves stable or improved neurological status:   Assess for and report changes in neurological status   Initiate measures to prevent increased intracranial pressure  06/04/2024 2023 by Zena Starleen Rubin, RN  Outcome: Progressing  Flowsheets (Taken 06/04/2024 2000)  Achieves stable or improved neurological status:   Assess for and report changes in neurological status   Initiate measures to prevent increased intracranial pressure   Maintain blood pressure and fluid volume within ordered parameters to optimize cerebral perfusion and minimize risk of hemorrhage   Monitor temperature, glucose, and sodium. Initiate appropriate interventions as ordered  Goal: Absence of seizures  06/05/2024 1019 by Gershon Shams, RN  Outcome: Progressing  Flowsheets (Taken 06/05/2024 0800)  Absence of seizures:   Monitor for seizure activity.  If seizure occurs, document type and location of movements and any associated apnea   If seizure occurs, turn head to side and suction secretions as needed  06/04/2024 2023 by Zena Starleen Rubin, RN  Outcome: Progressing  Flowsheets (Taken 06/04/2024 2000)  Absence of seizures: Monitor for seizure activity.  If seizure occurs, document type and location of movements and any associated apnea  Goal: Achieves maximal functionality and self care  06/05/2024 1019 by Gershon Shams, RN  Outcome: Progressing  Flowsheets (Taken 06/05/2024 0800)  Achieves maximal functionality and self care:   Monitor swallowing  and airway patency with  patient fatigue and changes in neurological status   Encourage and assist patient to increase activity and self care with guidance from physical therapy/occupational therapy  06/04/2024 2023 by Zena Starleen Rubin, RN  Outcome: Progressing  Flowsheets (Taken 06/04/2024 2000)  Achieves maximal functionality and self care: Monitor swallowing and airway patency with patient fatigue and changes in neurological status     Problem: Respiratory - Adult  Goal: Achieves optimal ventilation and oxygenation  06/05/2024 1019 by Gershon Shams, RN  Outcome: Progressing  Flowsheets (Taken 06/05/2024 0800)  Achieves optimal ventilation and oxygenation:   Assess for changes in respiratory status   Assess for changes in mentation and behavior   Position to facilitate oxygenation and minimize respiratory effort  06/04/2024 2023 by Zena Starleen Rubin, RN  Outcome: Progressing  Flowsheets (Taken 06/04/2024 2000)  Achieves optimal ventilation and oxygenation:   Assess for changes in respiratory status   Assess for changes in mentation and behavior   Position to facilitate oxygenation and minimize respiratory effort   Oxygen supplementation based on oxygen saturation or arterial blood gases     Problem: Cardiovascular - Adult  Goal: Maintains optimal cardiac output and hemodynamic stability  06/05/2024 1019 by Gershon Shams, RN  Outcome: Progressing  Flowsheets (Taken 06/05/2024 0800)  Maintains optimal cardiac output and hemodynamic stability:   Monitor blood pressure and heart rate   Monitor urine output and notify Licensed Independent Practitioner for values outside of normal range   Assess for signs of decreased cardiac output  06/04/2024 2023 by Zena Starleen Rubin, RN  Outcome: Progressing  Flowsheets (Taken 06/04/2024 2000)  Maintains optimal cardiac output and hemodynamic stability:   Monitor blood pressure and heart rate   Monitor urine output and notify Licensed Independent Practitioner for values outside of normal range   Assess  for signs of decreased cardiac output  Goal: Absence of cardiac dysrhythmias or at baseline  06/05/2024 1019 by Gershon Shams, RN  Outcome: Progressing  Flowsheets (Taken 06/05/2024 0800)  Absence of cardiac dysrhythmias or at baseline:   Monitor cardiac rate and rhythm   Assess for signs of decreased cardiac output  06/04/2024 2023 by Zena Starleen Rubin, RN  Outcome: Progressing  Flowsheets (Taken 06/04/2024 2000)  Absence of cardiac dysrhythmias or at baseline:   Monitor cardiac rate and rhythm   Assess for signs of decreased cardiac output     Problem: Skin/Tissue Integrity - Adult  Goal: Skin integrity remains intact  Description: 1.  Monitor for areas of redness and/or skin breakdown  2.  Assess vascular access sites hourly  3.  Every 4-6 hours minimum:  Change oxygen saturation probe site  4.  Every 4-6 hours:  If on nasal continuous positive airway pressure, respiratory therapy assess nares and determine need for appliance change or resting period  06/05/2024 1019 by Gershon Shams, RN  Outcome: Progressing  Flowsheets (Taken 06/05/2024 0800)  Skin Integrity Remains Intact:   Monitor for areas of redness and/or skin breakdown   Assess vascular access sites hourly   Every 4-6 hours minimum:  Change oxygen saturation probe site  06/04/2024 2023 by Zena Starleen Rubin, RN  Outcome: Progressing  Flowsheets (Taken 06/04/2024 2000)  Skin Integrity Remains Intact: Monitor for areas of redness and/or skin breakdown  Goal: Incisions, wounds, or drain sites healing without S/S of infection  06/05/2024 1019 by Gershon Shams, RN  Outcome: Progressing  Flowsheets (Taken 06/05/2024 0800)  Incisions, Wounds, or Drain Sites Healing Without Sign and Symptoms  of Infection:   ADMISSION and DAILY: Assess and document risk factors for pressure ulcer development   TWICE DAILY: Assess and document skin integrity  06/04/2024 2023 by Zena Starleen Rubin, RN  Outcome: Progressing  Flowsheets (Taken 06/04/2024 2000)  Incisions, Wounds, or  Drain Sites Healing Without Sign and Symptoms of Infection: TWICE DAILY: Assess and document skin integrity  Goal: Oral mucous membranes remain intact  06/05/2024 1019 by Gershon Shams, RN  Outcome: Progressing  Flowsheets (Taken 06/05/2024 0800)  Oral Mucous Membranes Remain Intact:   Assess oral mucosa and hygiene practices   Implement preventative oral hygiene regimen  06/04/2024 2023 by Zena Starleen Rubin, RN  Outcome: Progressing  Flowsheets (Taken 06/04/2024 2000)  Oral Mucous Membranes Remain Intact: Assess oral mucosa and hygiene practices     Problem: Musculoskeletal - Adult  Goal: Return mobility to safest level of function  06/05/2024 1019 by Gershon Shams, RN  Outcome: Progressing  Flowsheets (Taken 06/05/2024 0800)  Return Mobility to Safest Level of Function:   Assess patient stability and activity tolerance for standing, transferring and ambulating with or without assistive devices   Assist with transfers and ambulation using safe patient handling equipment as needed   Ensure adequate protection for wounds/incisions during mobilization  06/04/2024 2023 by Zena Starleen Rubin, RN  Outcome: Progressing  Flowsheets (Taken 06/04/2024 2000)  Return Mobility to Safest Level of Function:   Assess patient stability and activity tolerance for standing, transferring and ambulating with or without assistive devices   Assist with transfers and ambulation using safe patient handling equipment as needed   Ensure adequate protection for wounds/incisions during mobilization  Goal: Maintain proper alignment of affected body part  06/05/2024 1019 by Gershon Shams, RN  Outcome: Progressing  Flowsheets (Taken 06/05/2024 0800)  Maintain proper alignment of affected body part: Support and protect limb and body alignment per provider's orders  06/04/2024 2023 by Zena Starleen Rubin, RN  Outcome: Progressing  Flowsheets (Taken 06/04/2024 2000)  Maintain proper alignment of affected body part: Support and protect limb and body  alignment per provider's orders  Goal: Return ADL status to a safe level of function  06/05/2024 1019 by Gershon Shams, RN  Outcome: Progressing  Flowsheets (Taken 06/05/2024 0800)  Return ADL Status to a Safe Level of Function:   Administer medication as ordered   Assess activities of daily living deficits and provide assistive devices as needed   Obtain physical therapy/occupational therapy consults as needed  06/04/2024 2023 by Zena Starleen Rubin, RN  Outcome: Progressing  Flowsheets (Taken 06/04/2024 2000)  Return ADL Status to a Safe Level of Function: Administer medication as ordered     Problem: Gastrointestinal - Adult  Goal: Minimal or absence of nausea and vomiting  06/05/2024 1019 by Gershon Shams, RN  Outcome: Progressing  Flowsheets (Taken 06/05/2024 0800)  Minimal or absence of nausea and vomiting:   Administer IV fluids as ordered to ensure adequate hydration   Maintain NPO status until nausea and vomiting are resolved   Provide nonpharmacologic comfort measures as appropriate  06/04/2024 2023 by Zena Starleen Rubin, RN  Outcome: Progressing  Flowsheets (Taken 06/04/2024 2000)  Minimal or absence of nausea and vomiting:   Administer IV fluids as ordered to ensure adequate hydration   Maintain NPO status until nausea and vomiting are resolved  Goal: Maintains or returns to baseline bowel function  06/05/2024 1019 by Gershon Shams, RN  Outcome: Progressing  Flowsheets (Taken 06/05/2024 0800)  Maintains or returns to baseline bowel function:  Assess bowel function   Encourage oral fluids to ensure adequate hydration   Administer IV fluids as ordered to ensure adequate hydration  06/04/2024 2023 by Zena Starleen Rubin, RN  Outcome: Progressing  Flowsheets (Taken 06/04/2024 2000)  Maintains or returns to baseline bowel function:   Assess bowel function   Administer ordered medications as needed  Goal: Maintains adequate nutritional intake  06/05/2024 1019 by Gershon Shams, RN  Outcome:  Progressing  Flowsheets (Taken 06/05/2024 0800)  Maintains adequate nutritional intake:   Monitor percentage of each meal consumed   Identify factors contributing to decreased intake, treat as appropriate   Monitor intake and output, weight and lab values  06/04/2024 2023 by Zena Starleen Rubin, RN  Outcome: Progressing  Flowsheets (Taken 06/04/2024 2000)  Maintains adequate nutritional intake:   Monitor percentage of each meal consumed   Monitor intake and output, weight and lab values  Goal: Establish and maintain optimal ostomy function  06/05/2024 1019 by Gershon Shams, RN  Outcome: Progressing  Flowsheets (Taken 06/05/2024 0800)  Establish and maintain optimal ostomy function:   Monitor output from ostomies   Administer IV fluids and TPN as ordered  06/04/2024 2023 by Zena Starleen Rubin, RN  Outcome: Progressing  Flowsheets (Taken 06/04/2024 2000)  Establish and maintain optimal ostomy function: Nutrition consult     Problem: Genitourinary - Adult  Goal: Absence of urinary retention  06/05/2024 1019 by Gershon Shams, RN  Outcome: Progressing  06/04/2024 2023 by Zena Starleen Rubin, RN  Outcome: Progressing  Flowsheets (Taken 06/04/2024 2000)  Absence of urinary retention:   Assess patients ability to void and empty bladder   Monitor intake/output and perform bladder scan as needed  Goal: Urinary catheter remains patent  06/05/2024 1019 by Gershon Shams, RN  Outcome: Progressing  06/04/2024 2023 by Zena Starleen Rubin, RN  Outcome: Progressing     Problem: Infection - Adult  Goal: Absence of infection at discharge  06/05/2024 1019 by Gershon Shams, RN  Outcome: Progressing  Flowsheets (Taken 06/05/2024 0800)  Absence of infection at discharge:   Assess and monitor for signs and symptoms of infection   Monitor lab/diagnostic results   Monitor all insertion sites i.e., indwelling lines, tubes and drains  06/04/2024 2023 by Zena Starleen Rubin, RN  Outcome: Progressing  Flowsheets (Taken 06/04/2024 2000)  Absence of  infection at discharge:   Assess and monitor for signs and symptoms of infection   Monitor lab/diagnostic results   Monitor all insertion sites i.e., indwelling lines, tubes and drains  Goal: Absence of infection during hospitalization  06/05/2024 1019 by Gershon Shams, RN  Outcome: Progressing  Flowsheets (Taken 06/05/2024 0800)  Absence of infection during hospitalization:   Assess and monitor for signs and symptoms of infection   Monitor lab/diagnostic results   Monitor all insertion sites i.e., indwelling lines, tubes and drains  06/04/2024 2023 by Zena Starleen Rubin, RN  Outcome: Progressing  Flowsheets (Taken 06/04/2024 2000)  Absence of infection during hospitalization:   Assess and monitor for signs and symptoms of infection   Monitor lab/diagnostic results   Monitor all insertion sites i.e., indwelling lines, tubes and drains  Goal: Absence of fever/infection during anticipated neutropenic period  06/05/2024 1019 by Gershon Shams, RN  Outcome: Progressing  Flowsheets (Taken 06/05/2024 0800)  Absence of fever/infection during anticipated neutropenic period:   Monitor white blood cell count   Administer growth factors as ordered  06/04/2024 2023 by Zena Starleen Rubin, RN  Outcome: Progressing  Flowsheets (Taken 06/04/2024 2000)  Absence of fever/infection during anticipated neutropenic period: Monitor white blood cell count     Problem: Metabolic/Fluid and Electrolytes - Adult  Goal: Electrolytes maintained within normal limits  06/05/2024 1019 by Gershon Shams, RN  Outcome: Progressing  Flowsheets (Taken 06/05/2024 0800)  Electrolytes maintained within normal limits:   Monitor labs and assess patient for signs and symptoms of electrolyte imbalances   Administer electrolyte replacement as ordered  06/04/2024 2023 by Zena Starleen Rubin, RN  Outcome: Progressing  Flowsheets (Taken 06/04/2024 2000)  Electrolytes maintained within normal limits: Monitor labs and assess patient for signs and symptoms of  electrolyte imbalances  Goal: Hemodynamic stability and optimal renal function maintained  06/05/2024 1019 by Gershon Shams, RN  Outcome: Progressing  Flowsheets (Taken 06/05/2024 0800)  Hemodynamic stability and optimal renal function maintained:   Monitor labs and assess for signs and symptoms of volume excess or deficit   Monitor intake, output and patient weight   Monitor urine specific gravity, serum osmolarity and serum sodium as indicated or ordered  06/04/2024 2023 by Zena Starleen Rubin, RN  Outcome: Progressing  Flowsheets (Taken 06/04/2024 2000)  Hemodynamic stability and optimal renal function maintained:   Monitor labs and assess for signs and symptoms of volume excess or deficit   Monitor intake, output and patient weight  Goal: Glucose maintained within prescribed range  06/05/2024 1019 by Gershon Shams, RN  Outcome: Progressing  Flowsheets (Taken 06/05/2024 0800)  Glucose maintained within prescribed range:   Monitor blood glucose as ordered   Assess for signs and symptoms of hyperglycemia and hypoglycemia   Administer ordered medications to maintain glucose within target range  06/04/2024 2023 by Zena Starleen Rubin, RN  Outcome: Progressing  Flowsheets (Taken 06/04/2024 2000)  Glucose maintained within prescribed range: Monitor blood glucose as ordered     Problem: Safety - Adult  Goal: Free from fall injury  06/05/2024 1019 by Gershon Shams, RN  Outcome: Progressing  06/04/2024 2023 by Zena Starleen Rubin, RN  Outcome: Progressing     Problem: Spiritual Care  Goal: Pt/Family able to move forward in process of forgiving self, others, and/or higher power  Description: INTERVENTIONS:  1. Assist patient/family to overcome blocks to healing by use of spiritual practices (prayer, meditation, guided imagery, reiki, breath work, catering manager).  2. De-myth guilt and help patient/family identify possible irrational spiritual/cultural beliefs and values.  3. Explore possibilities of making amends & reconciliation with  self, others, and/or a greater power.  4. Guide patient/family in identifying painful feelings of guilt.  5. Help patient/famiy explore and identify spiritual beliefs, cultural understandings or values that may help or hinder letting go of issue.  6. Help patient/family explore feelings of anger, bitterness, resentment.  7. Help patient/family identify and examine the situation in which these feelings are experienced.  8. Help patient/family identify destructive displacement of feelings onto other individuals.  9. Invite use of sacraments/rituals/ceremonies as appropriate (e.g. - confession, anointing, smudging).  10. Refer patient/family to formal counseling and/or to faith community for further support work.  06/05/2024 1019 by Gershon Shams, RN  Outcome: Progressing  Flowsheets (Taken 06/05/2024 0800)  Patient/family able to move forward in process of forgiving self, others, and/or higher power:   Assist patient to overcome blocks to healing by use of spiritual practices (prayer, meditation, guided imagery, reiki, breath work, etc)   De-myth guilt and help patient/family identify possible irrational spiritual/cultural beliefs and values   Guide patient/family in identifying painful feelings of guilt   Explore possibilities of making  amends and reconciliation with self, others, and/or a greater power   Help patient explore and identify spiritual beliefs, cultural understandings or values that may help or hinder letting go of issue  06/04/2024 2023 by Zena Starleen Rubin, RN  Outcome: Progressing  Flowsheets (Taken 06/04/2024 2000)  Patient/family able to move forward in process of forgiving self, others, and/or higher power: Assist patient to overcome blocks to healing by use of spiritual practices (prayer, meditation, guided imagery, reiki, breath work, etc)     Problem: Pain  Goal: Verbalizes/displays adequate comfort level or baseline comfort level  06/05/2024 1019 by Gershon Shams, RN  Outcome:  Progressing  06/04/2024 2023 by Zena Starleen Rubin, RN  Outcome: Progressing     Problem: Nutrition Deficit:  Goal: Optimize nutritional status  06/05/2024 1019 by Gershon Shams, RN  Outcome: Progressing  06/04/2024 2023 by Zena Starleen Rubin, RN  Outcome: Progressing     Problem: Skin/Tissue Integrity  Goal: Skin integrity remains intact  Description: 1.  Monitor for areas of redness and/or skin breakdown  2.  Assess vascular access sites hourly  3.  Every 4-6 hours minimum:  Change oxygen saturation probe site  4.  Every 4-6 hours:  If on nasal continuous positive airway pressure, respiratory therapy assess nares and determine need for appliance change or resting period  06/05/2024 1019 by Gershon Shams, RN  Outcome: Progressing  Flowsheets (Taken 06/05/2024 0800)  Skin Integrity Remains Intact:   Monitor for areas of redness and/or skin breakdown   Assess vascular access sites hourly   Every 4-6 hours minimum:  Change oxygen saturation probe site  06/04/2024 2023 by Zena Starleen Rubin, RN  Outcome: Progressing  Flowsheets (Taken 06/04/2024 2000)  Skin Integrity Remains Intact: Monitor for areas of redness and/or skin breakdown     "

## 2024-06-06 ENCOUNTER — Inpatient Hospital Stay: Admit: 2024-06-06 | Payer: Medicare (Managed Care) | Primary: Diagnostic Radiology

## 2024-06-06 LAB — BASIC METABOLIC PANEL
Anion Gap: 7 mmol/L (ref 2–14)
Anion Gap: 7 mmol/L (ref 2–14)
BUN/Creatinine Ratio: 39 — ABNORMAL HIGH (ref 12–20)
BUN/Creatinine Ratio: 41 — ABNORMAL HIGH (ref 12–20)
BUN: 32 mg/dL — ABNORMAL HIGH (ref 8–23)
BUN: 32 mg/dL — ABNORMAL HIGH (ref 8–23)
CO2: 25 mmol/L (ref 20–29)
CO2: 27 mmol/L (ref 20–29)
Calcium: 8.6 mg/dL — ABNORMAL LOW (ref 8.8–10.2)
Calcium: 8.7 mg/dL — ABNORMAL LOW (ref 8.8–10.2)
Chloride: 97 mmol/L — ABNORMAL LOW (ref 98–107)
Chloride: 103 mmol/L (ref 98–107)
Creatinine: 0.83 mg/dL (ref 0.60–1.00)
Creatinine: 0.77 mg/dL (ref 0.60–1.00)
Est, Glom Filt Rate: 79 ml/min/1.73m2 (ref >59)
Est, Glom Filt Rate: 87 ml/min/1.73m2 (ref >59)
Glucose: 270 mg/dL — ABNORMAL HIGH (ref 65–100)
Glucose: 248 mg/dL — ABNORMAL HIGH (ref 65–100)
Potassium: 4.4 mmol/L (ref 3.5–5.1)
Potassium: 4.3 mmol/L (ref 3.5–5.1)
Sodium: 129 mmol/L — ABNORMAL LOW (ref 136–145)
Sodium: 137 mmol/L (ref 136–145)

## 2024-06-06 LAB — POCT GLUCOSE
POC Glucose: 277 mg/dL — ABNORMAL HIGH (ref 65–100)
POC Glucose: 276 mg/dL — ABNORMAL HIGH (ref 65–100)
POC Glucose: 303 mg/dL — ABNORMAL HIGH (ref 65–100)
POC Glucose: 266 mg/dL — ABNORMAL HIGH (ref 65–100)
POC Glucose: 247 mg/dL — ABNORMAL HIGH (ref 65–100)
POC Glucose: 270 mg/dL — ABNORMAL HIGH (ref 65–100)

## 2024-06-06 LAB — BLOOD GAS, ARTERIAL
Base Excess, Arterial: 0.8 mmol/L (ref 0–3)
Carboxyhgb, Arterial: 0.1 % — ABNORMAL LOW (ref 1–2)
FIO2 Arterial: 30 %
HCO3, Arterial: 25 mmol/L (ref 22–26)
Methemoglobin, Arterial: 0.2 % (ref 0–1.4)
O2 Sat, Arterial: 95 % (ref 95–99)
Oxyhemoglobin: 95.1 % (ref 95–99)
POC PEEP/CPA: 6
POC Pressure Support: 6
Temperature: 98.6
pCO2, Arterial: 38 mmHg (ref 35–45)
pH, Arterial: 7.43 (ref 7.35–7.45)
pO2, Arterial: 81 mmHg (ref 80–100)

## 2024-06-06 LAB — LEGIONELLA ANTIGEN, URINE: L pneumophila S1 Ag, Ur: NEGATIVE

## 2024-06-06 LAB — CBC
Hematocrit: 31.2 % — ABNORMAL LOW (ref 35.0–47.0)
Hemoglobin: 9.9 g/dL — ABNORMAL LOW (ref 11.5–16.0)
MCH: 26.6 pg (ref 26.0–34.0)
MCHC: 31.7 g/dL (ref 30.0–36.5)
MCV: 83.9 FL (ref 80.0–99.0)
MPV: 11.5 FL (ref 8.9–12.9)
Nucleated RBCs: 0.3 /100{WBCs} — ABNORMAL HIGH
Platelets: 179 K/uL (ref 150–400)
RBC: 3.72 M/uL — ABNORMAL LOW (ref 3.80–5.20)
RDW: 18.1 % — ABNORMAL HIGH (ref 11.5–14.5)
WBC: 5.7 K/uL (ref 3.6–11.0)
nRBC: 0.02 K/uL — ABNORMAL HIGH (ref 0.00–0.01)

## 2024-06-06 LAB — OSMOLALITY, URINE: Osmolality, Ur: 814 mosm/kg

## 2024-06-06 LAB — CULTURE, BLOOD 2

## 2024-06-06 LAB — PHOSPHORUS: Phosphorus: 2.2 mg/dL — ABNORMAL LOW (ref 2.5–4.5)

## 2024-06-06 LAB — AMMONIA: Ammonia: 19 umol/L (ref 11–51)

## 2024-06-06 LAB — SODIUM, URINE, RANDOM: SODIUM, RANDOM URINE: <20 mmol/L

## 2024-06-06 LAB — S. PNEUMONIA ANTIGEN, URINE/CSF: S pneumoniae Ag, CSF: NEGATIVE

## 2024-06-06 LAB — CREATININE, RANDOM URINE: Creatinine, Ur: 109 mg/dL (ref 28.00–217.00)

## 2024-06-06 LAB — MAGNESIUM: Magnesium: 2.2 mg/dL (ref 1.6–2.4)

## 2024-06-06 MED ORDER — IPRATROPIUM-ALBUTEROL 0.5-2.5 (3) MG/3ML IN SOLN
0.5-2.5 | RESPIRATORY_TRACT | Status: DC
Start: 2024-06-06 — End: 2024-06-10
  Administered 2024-06-06 – 2024-06-10 (×15): 1 via RESPIRATORY_TRACT

## 2024-06-06 MED ORDER — POLYETHYLENE GLYCOL 3350 17 G PO PACK
17 | Freq: Every day | ORAL | Status: DC | PRN
Start: 2024-06-06 — End: 2024-06-06

## 2024-06-06 MED ORDER — INSULIN LISPRO 100 UNIT/ML IJ SOLN
100 | INTRAMUSCULAR | Status: DC
Start: 2024-06-06 — End: 2024-06-16
  Administered 2024-06-06: 21:00:00 4 [IU] via SUBCUTANEOUS
  Administered 2024-06-06: 17:00:00 2 [IU] via SUBCUTANEOUS
  Administered 2024-06-07: 1 [IU] via SUBCUTANEOUS
  Administered 2024-06-07 – 2024-06-14 (×3): 2 [IU] via SUBCUTANEOUS

## 2024-06-06 MED ORDER — ALBUTEROL SULFATE 2.5 MG/0.5ML IN NEBU
2.5 | RESPIRATORY_TRACT | Status: DC | PRN
Start: 2024-06-06 — End: 2024-06-16

## 2024-06-06 MED ORDER — INSULIN GLARGINE 100 UNIT/ML SC SOLN
100 | Freq: Every day | SUBCUTANEOUS | Status: DC
Start: 2024-06-06 — End: 2024-06-16
  Administered 2024-06-06 – 2024-06-16 (×7): 10 [IU] via SUBCUTANEOUS

## 2024-06-06 MED ORDER — INSULIN LISPRO 100 UNIT/ML IJ SOLN
100 | Freq: Four times a day (QID) | INTRAMUSCULAR | Status: DC
Start: 2024-06-06 — End: 2024-06-06

## 2024-06-06 MED ORDER — ASPIRIN 81 MG PO CHEW
81 | Freq: Every day | ORAL | Status: DC
Start: 2024-06-06 — End: 2024-06-07
  Administered 2024-06-07: 15:00:00 81 mg via ORAL

## 2024-06-06 MED ORDER — ASPIRIN 300 MG RE SUPP
300 | Freq: Every day | RECTAL | Status: DC
Start: 2024-06-06 — End: 2024-06-07
  Administered 2024-06-06: 16:00:00 300 mg via RECTAL

## 2024-06-06 MED ORDER — ATORVASTATIN CALCIUM 40 MG PO TABS
40 | Freq: Every evening | ORAL | Status: DC
Start: 2024-06-06 — End: 2024-06-07

## 2024-06-06 MED ORDER — ONDANSETRON HCL 4 MG/2ML IJ SOLN
4 | Freq: Four times a day (QID) | INTRAMUSCULAR | Status: DC | PRN
Start: 2024-06-06 — End: 2024-06-06

## 2024-06-06 MED ORDER — SODIUM PHOSPHATE 3 MMOL/ML IV SOLN (MIXTURES ONLY)
3 | Freq: Once | INTRAVENOUS | Status: AC
Start: 2024-06-06 — End: 2024-06-06
  Administered 2024-06-06: 10:00:00 20 mmol via INTRAVENOUS

## 2024-06-06 MED ORDER — NORMAL SALINE FLUSH 0.9 % IV SOLN
0.9 | INTRAVENOUS | Status: DC | PRN
Start: 2024-06-06 — End: 2024-06-06

## 2024-06-06 MED ORDER — BUDESONIDE-FORMOTEROL FUMARATE 160-4.5 MCG/ACT IN AERO
160-4.5 | Freq: Two times a day (BID) | RESPIRATORY_TRACT | Status: DC
Start: 2024-06-06 — End: 2024-06-16

## 2024-06-06 MED ORDER — ONDANSETRON 4 MG PO TBDP
4 | Freq: Three times a day (TID) | ORAL | Status: DC | PRN
Start: 2024-06-06 — End: 2024-06-06

## 2024-06-06 MED ORDER — SODIUM CHLORIDE 0.9 % IV SOLN
0.9 | INTRAVENOUS | Status: DC | PRN
Start: 2024-06-06 — End: 2024-06-16

## 2024-06-06 MED ORDER — TIOTROPIUM BROMIDE 2.5 MCG/ACT IN AERS
2.5 | Freq: Every day | RESPIRATORY_TRACT | Status: DC
Start: 2024-06-06 — End: 2024-06-16

## 2024-06-06 MED ORDER — IPRATROPIUM-ALBUTEROL 0.5-2.5 (3) MG/3ML IN SOLN
0.5-2.5 | RESPIRATORY_TRACT | Status: DC | PRN
Start: 2024-06-06 — End: 2024-06-16
  Administered 2024-06-06 – 2024-06-08 (×4): 1 via RESPIRATORY_TRACT

## 2024-06-06 MED ORDER — NORMAL SALINE FLUSH 0.9 % IV SOLN
0.9 | Freq: Two times a day (BID) | INTRAVENOUS | Status: DC
Start: 2024-06-06 — End: 2024-06-06

## 2024-06-06 MED ORDER — IOPAMIDOL 76 % IV SOLN
76 | Freq: Once | INTRAVENOUS | Status: AC | PRN
Start: 2024-06-06 — End: 2024-06-06
  Administered 2024-06-06: 15:00:00 100 mL via INTRAVENOUS

## 2024-06-06 MED FILL — INSULIN LISPRO 100 UNIT/ML IJ SOLN: 100 [IU]/mL | INTRAMUSCULAR | Qty: 4 | Fill #0

## 2024-06-06 MED FILL — ELIQUIS 5 MG PO TABS: 5 mg | ORAL | Qty: 1

## 2024-06-06 MED FILL — AZITHROMYCIN 500 MG IV SOLR: 500 mg | INTRAVENOUS | Qty: 500 | Fill #0

## 2024-06-06 MED FILL — SODIUM PHOSPHATES 15 MMOLE/5ML IV SOLN: 15 MMOLE/5ML | INTRAVENOUS | Qty: 6.67 | Fill #0

## 2024-06-06 MED FILL — METHYLPREDNISOLONE SODIUM SUCC 40 MG IJ SOLR: 40 mg | INTRAMUSCULAR | Qty: 40

## 2024-06-06 MED FILL — SERTRALINE HCL 50 MG PO TABS: 50 mg | ORAL | Qty: 2 | Fill #0

## 2024-06-06 MED FILL — ISOVUE-370 76 % IV SOLN: 76 % | INTRAVENOUS | Qty: 100 | Fill #0

## 2024-06-06 MED FILL — PANTOPRAZOLE SODIUM 40 MG IV SOLR: 40 mg | INTRAVENOUS | Qty: 40 | Fill #0

## 2024-06-06 MED FILL — METHYLPREDNISOLONE SODIUM SUCC 40 MG IJ SOLR: 40 mg | INTRAMUSCULAR | Qty: 40 | Fill #0

## 2024-06-06 MED FILL — DEXMEDETOMIDINE HCL IN NACL 400 MCG/100ML IV SOLN: 400 MCG/100ML | INTRAVENOUS | Qty: 100

## 2024-06-06 MED FILL — INSULIN LISPRO 100 UNIT/ML IJ SOLN: 100 [IU]/mL | INTRAMUSCULAR | Qty: 2 | Fill #0

## 2024-06-06 MED FILL — CEFTRIAXONE SODIUM 2 G IJ SOLR: 2 g | INTRAMUSCULAR | Qty: 2000 | Fill #0

## 2024-06-06 MED FILL — SODIUM CHLORIDE FLUSH 0.9 % IV SOLN: 0.9 % | INTRAVENOUS | Qty: 40 | Fill #0

## 2024-06-06 MED FILL — ELIQUIS 5 MG PO TABS: 5 mg | ORAL | Qty: 1 | Fill #0

## 2024-06-06 MED FILL — IPRATROPIUM-ALBUTEROL 0.5-2.5 (3) MG/3ML IN SOLN: 0.5-2.5 (3) MG/3ML | RESPIRATORY_TRACT | Qty: 3

## 2024-06-06 MED FILL — DIPRIVAN 1000 MG/100ML IV EMUL: 1000 MG/100ML | INTRAVENOUS | Qty: 100 | Fill #0

## 2024-06-06 MED FILL — INSULIN LISPRO 100 UNIT/ML IJ SOLN: 100 [IU]/mL | INTRAMUSCULAR | Qty: 4

## 2024-06-06 MED FILL — LANTUS 100 UNIT/ML SC SOLN: 100 [IU]/mL | SUBCUTANEOUS | Qty: 10 | Fill #0

## 2024-06-06 MED FILL — MIDODRINE HCL 5 MG PO TABS: 5 mg | ORAL | Qty: 2 | Fill #0

## 2024-06-06 MED FILL — ASPIRIN 300 MG RE SUPP: 300 mg | RECTAL | Qty: 1 | Fill #0

## 2024-06-06 MED FILL — IPRATROPIUM-ALBUTEROL 0.5-2.5 (3) MG/3ML IN SOLN: 0.5-2.5 (3) MG/3ML | RESPIRATORY_TRACT | Qty: 3 | Fill #0

## 2024-06-06 NOTE — Plan of Care (Signed)
 "Speech LAnguage Pathology Dysphagia EVALUATION    Patient: Breanna Lester (63 y.o. female)  Date: 06/06/2024  Primary Diagnosis: Acute respiratory failure, unspecified whether with hypoxia or hypercapnia (HCC) [J96.00]  Acute on chronic congestive heart failure, unspecified heart failure type (HCC) [I50.9]  Acute hypoxic respiratory failure (HCC) [J96.01]       Precautions:  aspiration    DIET RECOMMENDATIONS: due to increased WOB and need for BiPAP, per MD will maintain NPO. However, when pt is appropriate to initiate PO diet, oropharyngeal sw function appears [facility] for clear mildly thick liquids (single sips) and meds crushed if able in applesauce    SWALLOW SAFETY PRECAUTIONS: 1:1 assistance with ALL PO intake, oral care, HOB elevation    ASSESSMENT :  Based on the objective data described below, the patient presents with mild-mod oropharyngeal dysphagia negatively impacted by increased WOB.     Pt seen for bedside sw evaluation, alert, agreeable, see details below.   Pt admitted with SOB, acute respiratory failure, bilateral pna, acute on chronic CHF, AKI. Hx includes CVA without residual deficits per report, ETOH, cirrhosis.    Pt was intubated due to increasing restlessness, unable to maintain airway without BiPAP and removing BiPAP. Pt was extubated previously this date.  Pt had stroke alert called after extubation due to eyes not tracking, tongue deviation, LUE weakness.    CT head negative for acute findings. MRI unable to be completed due to pt size.     Pt with cloudiness in right eye and pt reports this is due to a cataract, loss of vision in right eye.     Pt with symmetrical oral motor function.  Unable to assess lingual ROM due to reduced command following. Hoarseness is noted, but improves during assessment. No overt dysarthria. Confusion noted.    Reduced oral bolus control and manipulation with ice chips.  This improves with liquid and puree trials.     Increased WOB at baseline that increases with  PO trials, accessory muscle activation.  Pt noted to have been placed on BiPAP following SLP assessment due to increased WOB.    Initially, SLP recs for clear mildly thick liquids as above; however, given increased WOB and need for BiPAP will maintain NPO at this time per Dr. Rawland.      Patient will benefit from skilled intervention to address the above impairments.    GOALS:    Problem: SLP Adult - Impaired Swallowing  Goal: By Discharge: Advance to least restrictive diet without signs or symptoms of aspiration for planned discharge setting.  See evaluation for individualized goals.  Description: Speech Therapy Swallow Goals    Initiated 06/06/2024    -Patient stated goal: pt unable to state    -Patient will tolerate Clear liquid, mildly thick liquids, and meds crushed in applesauce diet without clinical indicators of aspiration given mod cues within 7 day(s).        -Patient will tolerate PO trials without clinical indicators of aspiration given mod cues within 7 day(s).      -Patient will participate in modified barium swallow study within 7 day(s), as indicated pending pt's progress and diet tolerance.      -Patient will demonstrate understanding of swallow safety precautions and aspiration precautions, diet recs with no cues within 7 day(s).       Outcome: Progressing     PLAN :  Recommendations and Planned Interventions:  DIET RECOMMENDATIONS: due to increased WOB and need for BiPAP, per MD will maintain NPO. However,  when pt is appropriate to initiate PO diet, oropharyngeal sw function appears [facility] for clear mildly thick liquids (single sips) and meds crushed if able in applesauce    SWALLOW SAFETY PRECAUTIONS: 1:1 assistance with ALL PO intake, oral care, HOB elevation    Acute SLP Services: SLP Plan of Care: 5 times/week. Patient's rehabilitation potential is considered to be Fair.  Discharge Recommendations: Continue to assess pending progress     SUBJECTIVE:   Pt seen for initial evaluation, drowsy but  arousable and agreeable.    OBJECTIVE:     Past Medical History:   Diagnosis Date    Anxiety     Depression     Gastrointestinal disorder     Heart failure (HCC)     Hyperlipidemia     Hypertension     Liver disease 2009    liver failure    Pneumonia      Past Surgical History:   Procedure Laterality Date    ADENOIDECTOMY  1972    BACK SURGERY      cement discs following a fall    CATARACT REMOVAL      CESAREAN SECTION  1986 and 1993    DILATION AND CURETTAGE OF UTERUS  1999    EGD TRANSORAL BIOPSY SINGLE/MULTIPLE  05/17/2009         HYSTERECTOMY (CERVIX STATUS UNKNOWN)  2008    TONSILLECTOMY  1972    UPPER GASTROINTESTINAL ENDOSCOPY N/A 04/17/2024    ESOPHAGOGASTRODUODENOSCOPY performed by Madlyn Fendt, MD at [facility] ENDOSCOPY     Prior Level of Function/Home Situation:   Social/Functional History  Lives With: Daughter  Home Equipment: Oxygen    Baseline Assessment:  Subjective  Subjective: Pt agreeable to assessment  Behavior/Cognition  Behavior/Cognition: Alert;Cooperative  Baseline Assessment  Follows Directions: Simple  Current Diet : NPO  Current Liquid Diet : NPO  Prior Dysphagia History: pt denies    Hearing: appears functional     Start of Session End of Session   Heart Rate 70s 70s   SPO2 95 95     Cognitive and Communication Status:  Neurologic State: Alert  Orientation Level: Oriented to person, Disoriented to place, Disoriented to situation, and Disoriented to time  Cognition: Decreased command following and Impaired decision making    Dysphagia:  Oral Assessment:  Oral/Motor  Oral Motor: Within Functional Limits  Oral Hygiene: Moist  Dentition: Intact;Other (comment) (some missing teeth)  Oral Hygiene Comments: appears appropriate  Baseline Vocal Quality: Normal  Volitional Cough: Strong  Baseline Vocal Quality: Normal  Volitional Cough: Strong     P.O. Trials:  PO Trials  Neuromuscular Estim Used: No  Assessment Method(s): Observation;Palpation  Patient Position: upright in bed  Consistency Presented:  Thin;Pureed;Ice Chips;Moderately Thick  How Presented: SLP-fed/Presented  Bolus Acceptance: No impairment  Bolus Formation/Control: Impaired  Type of Impairment: Oral holding;Delayed;Mastication  Propulsion: Delayed (# of seconds)  Oral Residue: None  Initiation of Swallow: Delayed (# of seconds)  Laryngeal Elevation: Weak;Functional  Aspiration Signs/Symptoms: Increase in RR  Pharyngeal Phase Characteristics: No impairment, issues, or problems  Effective Modifications: None  Cues for Modifications: None    Voice:  hoarse/rough, reduced amplitude    Respiratory Status/Airway:  Heated high flow nasal cannula, 39% FiO2, 40LPM      Outcome Measure:  Functional Oral Intake Scale (FOIS): 1--Nothing by mouth (NPO) due to increase WOB, per MD    After Treatment:  Patient left in no apparent distress in bed, Call bell left within reach, and Nursing  notified     Pain:  VAS (numerical) 0    COMMUNICATION/EDUCATION:   Swallow safety precautions, Aspiration precautions, Diet recommendations, and Prognosis and SLP POC education provided to Patient via explanation, all questions/concerns addressed.  Patient demonstrated Fair understanding as evidenced by Limited understanding/response due to cognitive status.     The patient's plan of care including recommendations, planned interventions, and recommended diet changes were discussed with: Registered nurse and Physician    Patient/family agree to work toward stated goals and plan of care    Thank you,  Tinnie GORMAN Satterfield, M.S. CCC-SLP    SLP Individual Minutes  Time In: 1337  Time Out: 1347  Minutes: 10    "

## 2024-06-06 NOTE — Care Coordination (Addendum)
"  Pt lives with her daughter at [facility], [address] [place], [place]    Pt was IND with ADL.    Per IDR  Pt extubated, on NC.    Code stroke called.        "

## 2024-06-06 NOTE — Progress Notes (Signed)
 "CRITICAL CARE PROGRESS NOTE    Name: Breanna Lester   DOB: [birth year]   MRN: [id number]   Date: 06/06/2024      Diagnoses/problem list:   Acute on chronic hypoxemic respiratory failure  Severe bilateral pneumonia  Acute kidney injury    24-hour events:     10/16: Remains intubated, 35% FiO2 and PEEP of 8. No new complaints this morning. CXR with persistent bilateral infiltrates. Will try SAT and SBT today.   10/17: Doing well on SBT this morning. Will likely extubate if ABG looks good. Na low on AM labs, will check urine na and osm.     Assessment and plan:   This is a 63 year old female with COPD on 2 L home O2, anxiety, depression, HFpEF, HLD, HTN, A-fib on Eliquis , NIDDM type II, pancreatitis and EtOH cirrhosis who presented to the ER on 10/14 with complaints of worsening shortness of breath after she got into a verbal altercation with someone at home.  She was severely hypoxic in the ER with increased work of breathing which did not improve with BiPAP and she was eventually intubated.  Imaging findings concerning for severe bilateral pneumonia.    NEUROLOGICAL:    Wean sedation for RASS goal 0 to -1  CT head without acute findings  Continue sertraline   SAT daily    PULMONOLOGY:   Severe bilateral pneumonia on CT, no PE  CAP antibiotics, see ID  Steroids for severe pneumonia, re-evaluate need after 5 days   Lung protective ventilation  Resume Symbicort  and Spiriva  after extubation  SBT daily.  Goo    CARDIOVASCULAR:   Midodrine  to keep MAP >65   Hypotension likely due to sedation, resolved now    Mild troponin elevation is likely due to demand in the setting of hypoxia  TTE 05/05/2024 showed normal LVEF, diastolic function and normal RV function.  Moderate pericardial effusion noted at that time.  She does not have any signs of tamponade at this time    GASTROINTESTINAL:   Hold tube feeds for possible extubation   Bowel regimen as needed    RENAL/ELECTROLYTE:   AKI likely due to ATN from sepsis  Monitor UOP  closely  Correct electrolytes  Drop in Na noted, will check urine na and osm     ENDOCRINE:   Keep BG 140-180  Medium dose ISS    HEMATOLOGY/ONCOLOGY:   Eliquis  for Afib   Keep hemoglobin above 7    ID/MICRO:   UA with some pus cells and epithelial cells, likely contaminated  Monitor for fevers and leukocytosis  Empiric ceftriaxone  and azithromycin  for CAP  Blood cultures - 1/2 growing CONS, likely contaminant  Flu and COVID PCR negative  MRSA PCR negative  Follow-up urine strep and legionella Ag - still pending     ICU DAILY CHECKLIST     Code Status: Full  DVT Prophylaxis: Eliquis    T/L/D: PIV  SUP: PPI  Diet: TFs  Activity Level: Bed rest  ABCDEF Bundle/Checklist Completed: Yes  Disposition: Stay in ICU  Multidisciplinary Rounds Completed: Yes    OBJECTIVE:     Blood pressure (!) 125/55, pulse 63, temperature 98.9 F (37.2 C), temperature source Axillary, resp. rate 18, height 1.575 m (5' 2), weight 121.4 kg (267 lb 10.2 oz), SpO2 93%.  Physical Exam  Gen: Intubated  HEENT: NC/AT, supple, right eye cataract? Left pupil round and reactive   Cardiac: Regular rate and rhythm  Pulm: Bilateral rhonchi noted  ABD: Soft, non distended, non  tender  Extremities: no significant edema  Neuro: Sedated    Labs and Data: Reviewed 06/06/24  Medications: Reviewed 06/06/24  Imaging: Reviewed 06/06/24    Intake/Output:     Intake/Output Summary (Last 24 hours) at 06/06/2024 0838  Last data filed at 06/06/2024 0824  Gross per 24 hour   Intake 4088.61 ml   Output 600 ml   Net 3488.61 ml       CRITICAL CARE DOCUMENTATION  I had a face to face encounter with the patient, reviewed and interpreted patient data including clinical events, labs, images, vital signs, I/O's, and examined patient.  I have discussed the case and the plan and management of the patient's care with the consulting services, the bedside nurses and the respiratory therapist.      NOTE OF PERSONAL INVOLVEMENT IN CARE   This patient has a high probability of  imminent, clinically significant deterioration, which requires the highest level of preparedness to intervene urgently. I participated in the decision-making and personally managed or directed the management of the following life and organ supporting interventions that required my frequent assessment to treat or prevent imminent deterioration.    I personally spent 45 minutes of critical care time.  This is time spent at this critically ill patient's bedside actively involved in patient care as well as the coordination of care.  This does not include any procedural time which has been billed separately.    Anders Evert, MD  Critical Care Medicine  [facility]    "

## 2024-06-06 NOTE — Plan of Care (Signed)
"    Problem: Discharge Planning  Goal: Discharge to home or other facility with appropriate resources  Outcome: Not Progressing  Flowsheets (Taken 06/06/2024 1453)  Discharge to home or other facility with appropriate resources: Identify barriers to discharge with patient and caregiver     Problem: Safety - Medical Restraint  Goal: Remains free of injury from restraints (Restraint for Interference with Medical Device)  Description: INTERVENTIONS:  1. Determine that other, less restrictive measures have been tried or would not be effective before applying the restraint  2. Evaluate the patient's condition at the time of restraint application  3. Inform patient/family regarding the reason for restraint  4. Q2H: Monitor safety, psychosocial status, comfort, nutrition and hydration  Outcome: Completed  Flowsheets (Taken 06/06/2024 1453)  Remains free of injury from restraints (restraint for interference with medical device): Determine that other, less restrictive measures have been tried or would not be effective before applying the restraint     Problem: Neurosensory - Adult  Goal: Achieves stable or improved neurological status  Outcome: Not Progressing  Flowsheets (Taken 06/06/2024 1453)  Achieves stable or improved neurological status: Assess for and report changes in neurological status     Problem: Respiratory - Adult  Goal: Achieves optimal ventilation and oxygenation  Outcome: Not Progressing  Flowsheets (Taken 06/06/2024 1453)  Achieves optimal ventilation and oxygenation: Assess for changes in respiratory status     Problem: Cardiovascular - Adult  Goal: Maintains optimal cardiac output and hemodynamic stability  Outcome: Progressing  Flowsheets (Taken 06/06/2024 1453)  Maintains optimal cardiac output and hemodynamic stability: Monitor blood pressure and heart rate     Problem: Discharge Planning  Goal: Discharge to home or other facility with appropriate resources  Outcome: Not Progressing  Flowsheets (Taken  06/06/2024 1453)  Discharge to home or other facility with appropriate resources: Identify barriers to discharge with patient and caregiver     Problem: Neurosensory - Adult  Goal: Achieves stable or improved neurological status  Outcome: Not Progressing  Flowsheets (Taken 06/06/2024 1453)  Achieves stable or improved neurological status: Assess for and report changes in neurological status     Problem: Respiratory - Adult  Goal: Achieves optimal ventilation and oxygenation  Outcome: Not Progressing  Flowsheets (Taken 06/06/2024 1453)  Achieves optimal ventilation and oxygenation: Assess for changes in respiratory status     "

## 2024-06-06 NOTE — Progress Notes (Signed)
"     06/06/24 0808   Weaning Parameters   Spontaneous Breathing Trial Complete Yes   Respiratory Rate Observed 25   Ve 8.13   VT 370   RSBI 74     Pt doing well on CPAP/PS 6/6 30%. Pt able to follow commands and lift head.   "

## 2024-06-06 NOTE — Procedures (Signed)
"  Midline Insertion Procedure Note    Procedure: Insertion of #4 FR/18G Midline    Indications:  Poor Access       Procedure Details    Risks of hemorrhage, and adverse drug reaction were discussed.  Vessel assessment revealed compressible well defined vessels.  Multiple sticks noted to BUE prior to PICC RN arrival. LUE Midline placed without complication for patient.  2ml of Lidocaine  1% administered via infiltration prior to Midline insertion.  Time-Out performed at bedside with Charissa,RN.      #4 FR/18G Midline inserted to the L Basilic vein per hospital protocol.   Blood return:  yes    Catheter length 14 cm, with 0 cm exposed. Mid upper arm circumference is 50 cm prior to procedure. Post-Procedure Mid upper arm circumference is 50 cm.   Catheter was flushed with 20 cc NS. Patient did tolerate procedure well. Upon completion of procedure, all MIDLINE kit components accounted for and intact.  Post Procedure hand-off given to Charissa,RN.      Midline Brochure and card given to patient with teaching instruction. Bed returned to locked position with call bell in reach.       "

## 2024-06-06 NOTE — Progress Notes (Signed)
"  10:38 Left sided weakness noted. Complete hemianopia on the left side. Tongue deviation to the right. Disoriented to 4 P's. Informed the provider.  10:40am Code stroke alert   11:02am Transferred patient to CT stat as per order  1130am Talked to neurologist over the phone. Assessment relayed and current status of the patient discussed. As per the neurologist no acute finding seen on the CT scan. Continue neuro checks.   11:33am  Called again for tele neurology consult.  11:50 am Teleneurologist examined the patient via video call. Recommended the patient to have blood gas, ammonia level, LFT and MRI.  12:05 MRI screening form done  12:30 MRI cancelled, please see notes of MRI dept. Provider aware. Family updated.    "

## 2024-06-06 NOTE — Consults (Signed)
"  Acute tele-neurology consultation note uploaded under tabs Chart Review, Media.  "

## 2024-06-06 NOTE — Progress Notes (Signed)
"  Pt placed on heated high flow at this time for increased WOB. Pt O2 saturation has not dropped since extubation only WOB has increased. MD in to assess pt.   "

## 2024-06-06 NOTE — Progress Notes (Addendum)
"  Pt extubated at 0951. Pt suctioned above and below the cuff prior to extubation. No stridor heard, rhonchi/ wheezing heard bilaterally, duoneb  given. Placed on 3L. Sats 95%. Pt looks comfortable, can communicate verbally, has weak productive cough.  "

## 2024-06-06 NOTE — Progress Notes (Signed)
"  Patient had MRI brain wo constrast ordered, due to size limitations of scanner patient is not a MRI candidate.   Pt size- 80cm  Standard bore- 60cm  Wide bore- 72cm     MD notified and order cancelled.   "

## 2024-06-06 NOTE — Progress Notes (Signed)
"  Patient's own medication sent to security for safe keeping.  "

## 2024-06-06 NOTE — Progress Notes (Addendum)
"  Pt placed on BiPAP at this time for increased WOB 12/8. 16, 40%. Pt stated she takes breathing treatments q2 at home, Q4 duonebs ordered with q4 albuterol  PRNs. Pt tolerating NIV well, will continue to monitor.   "

## 2024-06-07 LAB — POCT GLUCOSE
POC Glucose: 135 mg/dL — ABNORMAL HIGH (ref 65–100)
POC Glucose: 145 mg/dL — ABNORMAL HIGH (ref 65–100)
POC Glucose: 175 mg/dL — ABNORMAL HIGH (ref 65–100)
POC Glucose: 193 mg/dL — ABNORMAL HIGH (ref 65–100)
POC Glucose: 208 mg/dL — ABNORMAL HIGH (ref 65–100)

## 2024-06-07 LAB — CBC
Hematocrit: 34 % — ABNORMAL LOW (ref 35.0–47.0)
Hemoglobin: 10.3 g/dL — ABNORMAL LOW (ref 11.5–16.0)
MCH: 25.7 pg — ABNORMAL LOW (ref 26.0–34.0)
MCHC: 30.3 g/dL (ref 30.0–36.5)
MCV: 84.8 FL (ref 80.0–99.0)
MPV: 11.4 FL (ref 8.9–12.9)
Nucleated RBCs: 0.3 /100{WBCs} — ABNORMAL HIGH
Platelets: 184 K/uL (ref 150–400)
RBC: 4.01 M/uL (ref 3.80–5.20)
RDW: 18.3 % — ABNORMAL HIGH (ref 11.5–14.5)
WBC: 9 K/uL (ref 3.6–11.0)
nRBC: 0.03 K/uL — ABNORMAL HIGH (ref 0.00–0.01)

## 2024-06-07 LAB — CULTURE, C AURIS SCREEN

## 2024-06-07 LAB — EKG 12-LEAD
Atrial Rate: 51 {beats}/min
P Axis: 3 degrees
P-R Interval: 144 ms
Q-T Interval: 534 ms
QRS Duration: 90 ms
QTc Calculation (Bazett): 492 ms
R Axis: 75 degrees
T Axis: -1 degrees
Ventricular Rate: 51 {beats}/min

## 2024-06-07 LAB — BASIC METABOLIC PANEL
Anion Gap: 13 mmol/L (ref 2–14)
BUN/Creatinine Ratio: 39 — ABNORMAL HIGH (ref 12–20)
BUN: 28 mg/dL — ABNORMAL HIGH (ref 8–23)
CO2: 21 mmol/L (ref 20–29)
Calcium: 8.6 mg/dL — ABNORMAL LOW (ref 8.8–10.2)
Chloride: 103 mmol/L (ref 98–107)
Creatinine: 0.73 mg/dL (ref 0.60–1.00)
Est, Glom Filt Rate: >90 ml/min/1.73m2 (ref >59)
Glucose: 188 mg/dL — ABNORMAL HIGH (ref 65–100)
Sodium: 137 mmol/L (ref 136–145)

## 2024-06-07 LAB — CHLORIDE, URINE, RANDOM: Chloride: <20 mmol/L

## 2024-06-07 LAB — PHOSPHORUS: Phosphorus: 2.5 mg/dL (ref 2.5–4.5)

## 2024-06-07 LAB — MAGNESIUM: Magnesium: 2.3 mg/dL (ref 1.6–2.4)

## 2024-06-07 LAB — UREA NITROGEN, URINE: Urea Nitrogen, Random Urine: 1528 mg/dL

## 2024-06-07 LAB — POTASSIUM: Potassium: 4.7 mmol/L (ref 3.5–5.1)

## 2024-06-07 MED ORDER — ALPRAZOLAM 0.25 MG PO TABS
0.25 | Freq: Three times a day (TID) | ORAL | Status: DC | PRN
Start: 2024-06-07 — End: 2024-06-07
  Administered 2024-06-07: 20:00:00 0.25 mg via ORAL

## 2024-06-07 MED ORDER — HYDRALAZINE HCL 20 MG/ML IJ SOLN
20 | INTRAMUSCULAR | Status: DC | PRN
Start: 2024-06-07 — End: 2024-06-16
  Administered 2024-06-07: 06:00:00 10 mg via INTRAVENOUS

## 2024-06-07 MED ORDER — OLANZAPINE 5 MG PO TABS
5 | Freq: Every evening | ORAL | Status: DC
Start: 2024-06-07 — End: 2024-06-08
  Administered 2024-06-08: 5 mg via ORAL

## 2024-06-07 MED ORDER — HALOPERIDOL LACTATE 5 MG/ML IJ SOLN
5 | Freq: Four times a day (QID) | INTRAMUSCULAR | Status: DC | PRN
Start: 2024-06-07 — End: 2024-06-16
  Administered 2024-06-08 (×2): 5 mg via INTRAVENOUS

## 2024-06-07 MED ORDER — ATORVASTATIN CALCIUM 40 MG PO TABS
40 | Freq: Every evening | ORAL | Status: DC
Start: 2024-06-07 — End: 2024-06-16
  Administered 2024-06-08 – 2024-06-16 (×9): 40 mg via ORAL

## 2024-06-07 MED FILL — IPRATROPIUM-ALBUTEROL 0.5-2.5 (3) MG/3ML IN SOLN: 0.5-2.5 (3) MG/3ML | RESPIRATORY_TRACT | Qty: 3 | Fill #0

## 2024-06-07 MED FILL — HYDRALAZINE HCL 20 MG/ML IJ SOLN: 20 mg/mL | INTRAMUSCULAR | Qty: 1 | Fill #0

## 2024-06-07 MED FILL — ASPIRIN 300 MG RE SUPP: 300 mg | RECTAL | Qty: 1 | Fill #0

## 2024-06-07 MED FILL — ELIQUIS 5 MG PO TABS: 5 mg | ORAL | Qty: 1 | Fill #0

## 2024-06-07 MED FILL — ASPIRIN LOW STRENGTH 81 MG PO CHEW: 81 mg | ORAL | Qty: 1 | Fill #0

## 2024-06-07 MED FILL — ALPRAZOLAM 0.25 MG PO TABS: 0.25 mg | ORAL | Qty: 1 | Fill #0

## 2024-06-07 MED FILL — METHYLPREDNISOLONE SODIUM SUCC 40 MG IJ SOLR: 40 mg | INTRAMUSCULAR | Qty: 40 | Fill #0

## 2024-06-07 MED FILL — TYLENOL 325 MG PO TABS: 325 mg | ORAL | Qty: 2 | Fill #0

## 2024-06-07 MED FILL — INSULIN LISPRO 100 UNIT/ML IJ SOLN: 100 [IU]/mL | INTRAMUSCULAR | Qty: 2 | Fill #0

## 2024-06-07 MED FILL — CEFTRIAXONE SODIUM 2 G IJ SOLR: 2 g | INTRAMUSCULAR | Qty: 2000 | Fill #0

## 2024-06-07 MED FILL — LANTUS 100 UNIT/ML SC SOLN: 100 [IU]/mL | SUBCUTANEOUS | Qty: 10 | Fill #0

## 2024-06-07 MED FILL — ONDANSETRON HCL 4 MG/2ML IJ SOLN: 4 MG/2ML | INTRAMUSCULAR | Qty: 2 | Fill #0

## 2024-06-07 MED FILL — AZITHROMYCIN 500 MG IV SOLR: 500 mg | INTRAVENOUS | Qty: 500 | Fill #0

## 2024-06-07 MED FILL — INSULIN LISPRO 100 UNIT/ML IJ SOLN: 100 [IU]/mL | INTRAMUSCULAR | Qty: 1 | Fill #0

## 2024-06-07 MED FILL — SERTRALINE HCL 50 MG PO TABS: 50 mg | ORAL | Qty: 2 | Fill #0

## 2024-06-07 NOTE — Plan of Care (Signed)
 "  Problem: Chronic Conditions and Co-morbidities  Goal: Patient's chronic conditions and co-morbidity symptoms are monitored and maintained or improved  Outcome: Progressing  Flowsheets (Taken 06/06/2024 2000)  Care Plan - Patient's Chronic Conditions and Co-Morbidity Symptoms are Monitored and Maintained or Improved: Monitor and assess patient's chronic conditions and comorbid symptoms for stability, deterioration, or improvement     Problem: Discharge Planning  Goal: Discharge to home or other facility with appropriate resources  Outcome: Progressing  Flowsheets (Taken 06/06/2024 2000)  Discharge to home or other facility with appropriate resources: Identify barriers to discharge with patient and caregiver     Problem: Neurosensory - Adult  Goal: Achieves stable or improved neurological status  Outcome: Progressing  Flowsheets (Taken 06/06/2024 2000)  Achieves stable or improved neurological status: Assess for and report changes in neurological status  Goal: Absence of seizures  Outcome: Progressing  Flowsheets (Taken 06/06/2024 2000)  Absence of seizures: Monitor for seizure activity.  If seizure occurs, document type and location of movements and any associated apnea  Goal: Achieves maximal functionality and self care  Outcome: Progressing  Flowsheets (Taken 06/06/2024 2000)  Achieves maximal functionality and self care: Monitor swallowing and airway patency with patient fatigue and changes in neurological status     Problem: Respiratory - Adult  Goal: Achieves optimal ventilation and oxygenation  Outcome: Progressing  Flowsheets (Taken 06/06/2024 2000)  Achieves optimal ventilation and oxygenation: Assess for changes in respiratory status     Problem: Cardiovascular - Adult  Goal: Maintains optimal cardiac output and hemodynamic stability  Outcome: Progressing  Flowsheets (Taken 06/06/2024 2000)  Maintains optimal cardiac output and hemodynamic stability: Monitor blood pressure and heart rate  Goal: Absence of  cardiac dysrhythmias or at baseline  Outcome: Progressing  Flowsheets (Taken 06/06/2024 2000)  Absence of cardiac dysrhythmias or at baseline: Monitor cardiac rate and rhythm     Problem: Skin/Tissue Integrity - Adult  Goal: Skin integrity remains intact  Description: 1.  Monitor for areas of redness and/or skin breakdown  2.  Assess vascular access sites hourly  3.  Every 4-6 hours minimum:  Change oxygen saturation probe site  4.  Every 4-6 hours:  If on nasal continuous positive airway pressure, respiratory therapy assess nares and determine need for appliance change or resting period  Outcome: Progressing  Flowsheets (Taken 06/06/2024 2000)  Skin Integrity Remains Intact: Monitor for areas of redness and/or skin breakdown  Goal: Incisions, wounds, or drain sites healing without S/S of infection  Outcome: Progressing  Flowsheets (Taken 06/06/2024 2000)  Incisions, Wounds, or Drain Sites Healing Without Sign and Symptoms of Infection: ADMISSION and DAILY: Assess and document risk factors for pressure ulcer development  Goal: Oral mucous membranes remain intact  Outcome: Progressing  Flowsheets (Taken 06/06/2024 2000)  Oral Mucous Membranes Remain Intact: Assess oral mucosa and hygiene practices     Problem: Musculoskeletal - Adult  Goal: Return mobility to safest level of function  Outcome: Progressing  Flowsheets (Taken 06/06/2024 2000)  Return Mobility to Safest Level of Function: Assess patient stability and activity tolerance for standing, transferring and ambulating with or without assistive devices  Goal: Maintain proper alignment of affected body part  Outcome: Progressing  Flowsheets (Taken 06/06/2024 2000)  Maintain proper alignment of affected body part: Support and protect limb and body alignment per provider's orders  Goal: Return ADL status to a safe level of function  Outcome: Progressing  Flowsheets (Taken 06/06/2024 2000)  Return ADL Status to a Safe Level of Function: Administer  medication as  ordered     Problem: Gastrointestinal - Adult  Goal: Minimal or absence of nausea and vomiting  Outcome: Progressing  Flowsheets (Taken 06/06/2024 2000)  Minimal or absence of nausea and vomiting: Administer IV fluids as ordered to ensure adequate hydration  Goal: Maintains or returns to baseline bowel function  Outcome: Progressing  Flowsheets (Taken 06/06/2024 2000)  Maintains or returns to baseline bowel function: Assess bowel function  Goal: Maintains adequate nutritional intake  Outcome: Progressing  Flowsheets (Taken 06/06/2024 2000)  Maintains adequate nutritional intake: Monitor percentage of each meal consumed  Goal: Establish and maintain optimal ostomy function  Outcome: Progressing  Flowsheets (Taken 06/06/2024 2000)  Establish and maintain optimal ostomy function: Monitor output from ostomies     Problem: Genitourinary - Adult  Goal: Absence of urinary retention  Outcome: Progressing  Flowsheets (Taken 06/06/2024 2000)  Absence of urinary retention: Assess patients ability to void and empty bladder  Goal: Urinary catheter remains patent  Outcome: Progressing  Flowsheets (Taken 06/06/2024 2000)  Urinary catheter remains patent: Assess patency of urinary catheter     Problem: Infection - Adult  Goal: Absence of infection at discharge  Outcome: Progressing  Flowsheets (Taken 06/06/2024 2000)  Absence of infection at discharge: Assess and monitor for signs and symptoms of infection  Goal: Absence of infection during hospitalization  Outcome: Progressing  Flowsheets (Taken 06/06/2024 2000)  Absence of infection during hospitalization: Assess and monitor for signs and symptoms of infection  Goal: Absence of fever/infection during anticipated neutropenic period  Outcome: Progressing  Flowsheets (Taken 06/06/2024 2000)  Absence of fever/infection during anticipated neutropenic period: Monitor white blood cell count     Problem: Metabolic/Fluid and Electrolytes - Adult  Goal: Electrolytes maintained within  normal limits  Outcome: Progressing  Flowsheets (Taken 06/06/2024 2000)  Electrolytes maintained within normal limits: Monitor labs and assess patient for signs and symptoms of electrolyte imbalances  Goal: Hemodynamic stability and optimal renal function maintained  Outcome: Progressing  Flowsheets (Taken 06/06/2024 2000)  Hemodynamic stability and optimal renal function maintained: Monitor labs and assess for signs and symptoms of volume excess or deficit  Goal: Glucose maintained within prescribed range  Outcome: Progressing  Flowsheets (Taken 06/06/2024 2000)  Glucose maintained within prescribed range: Monitor blood glucose as ordered     Problem: Safety - Adult  Goal: Free from fall injury  Outcome: Progressing     Problem: Spiritual Care  Goal: Pt/Family able to move forward in process of forgiving self, others, and/or higher power  Description: INTERVENTIONS:  1. Assist patient/family to overcome blocks to healing by use of spiritual practices (prayer, meditation, guided imagery, reiki, breath work, catering manager).  2. De-myth guilt and help patient/family identify possible irrational spiritual/cultural beliefs and values.  3. Explore possibilities of making amends & reconciliation with self, others, and/or a greater power.  4. Guide patient/family in identifying painful feelings of guilt.  5. Help patient/famiy explore and identify spiritual beliefs, cultural understandings or values that may help or hinder letting go of issue.  6. Help patient/family explore feelings of anger, bitterness, resentment.  7. Help patient/family identify and examine the situation in which these feelings are experienced.  8. Help patient/family identify destructive displacement of feelings onto other individuals.  9. Invite use of sacraments/rituals/ceremonies as appropriate (e.g. - confession, anointing, smudging).  10. Refer patient/family to formal counseling and/or to faith community for further support work.  Outcome:  Progressing  Flowsheets (Taken 06/06/2024 2000)  Patient/family able to move forward in process  of forgiving self, others, and/or higher power: Assist patient to overcome blocks to healing by use of spiritual practices (prayer, meditation, guided imagery, reiki, breath work, etc)     Problem: Pain  Goal: Verbalizes/displays adequate comfort level or baseline comfort level  Outcome: Progressing  Flowsheets  Taken 06/06/2024 2200  Verbalizes/displays adequate comfort level or baseline comfort level: Encourage patient to monitor pain and request assistance  Taken 06/06/2024 2000  Verbalizes/displays adequate comfort level or baseline comfort level: Encourage patient to monitor pain and request assistance     Problem: Nutrition Deficit:  Goal: Optimize nutritional status  Outcome: Progressing     Problem: Skin/Tissue Integrity  Goal: Skin integrity remains intact  Description: 1.  Monitor for areas of redness and/or skin breakdown  2.  Assess vascular access sites hourly  3.  Every 4-6 hours minimum:  Change oxygen saturation probe site  4.  Every 4-6 hours:  If on nasal continuous positive airway pressure, respiratory therapy assess nares and determine need for appliance change or resting period  Outcome: Progressing  Flowsheets (Taken 06/06/2024 2000)  Skin Integrity Remains Intact: Monitor for areas of redness and/or skin breakdown     "

## 2024-06-07 NOTE — Progress Notes (Signed)
"  [id number] Patient said that someone will kill her tonight. Patient expressed that she is afraid. Then patient started to cry and told the nurse that she is having anxiety attack. Informed the provider about this. Prescribed anti- anxiety medication for this  "

## 2024-06-07 NOTE — Consults (Signed)
 "     [facility]ULT[facility]          Chief Complaint/Admission Diagnosis: Acute respiratory failure, unspecified whether with hypoxia or hypercapnia (HCC) [J96.00]  Acute on chronic congestive heart failure, unspecified heart failure type (HCC) [I50.9]  Acute hypoxic respiratory failure (HCC) [J96.01]     I have been asked to see this 63 y.o. female in neurological consultation by Dr. Azeem, Ahad, MD  to render advice and opinion regarding Stroke like symptoms    ?     Impression/Recommendations:   63 year old female with COPD on 2 L home O2, anxiety, depression, HFpEF, HLD, HTN, A-fib on Eliquis , NIDDM type II, pancreatitis and EtOH cirrhosis who presented to the ER on 10/14 with complaints of worsening shortness of breath, while she was getting treated she further had an event on 06/06/2024 with patient had left-sided weakness with complete hemianopia over the left side and tongue deviated to the right.  Stroke code was called and had a CT head with CTA head and neck that did not show any acute findings and after discussing with the telestroke neurologist patient was further considered to have an MRI.    Currently patient's neurologic examination is at baseline and is antigravity throughout.  Patient cannot get an MRI however the initial CT head for now reassuring and I do not see any acute findings.  My possible differential for the etiology includes possible seizure versus altered mental status for underlying toxic metabolic etiologies.    - Consider EEG  - MRI would be helpful however understand the patient cannot fit into the machine.    If the patient is improving clinically can consider doing EEG as an outpatient as well.          Sharion Fairly, MD, MPH  Teleneurologist    HPI:   History was obtained from a review of the electronic record and from the patient and family.??     63 year old female with COPD on 2 L home O2, anxiety, depression, HFpEF, HLD, HTN, A-fib on Eliquis , NIDDM type II,  pancreatitis and EtOH cirrhosis who presented to the ER on 10/14 with complaints of worsening shortness of breath, while she was getting treated she further had an event on 06/06/2024 with patient had left-sided weakness with complete hemianopia over the left side and tongue deviated to the right.  Stroke code was called and had a CT head with CTA head and neck that did not show any acute findings and after discussing with the telestroke neurologist patient was further considered to have an MRI.    Of note per the nurse when the family member was around they did speak about patient having a stroke in the past and there were no residual deficits from it      Review of Symptoms:     A ten system review of constitutional, cardiovascular, respiratory, musculoskeletal, endocrine, skin, HEENT, genitourinary, neurological, and psychiatric systems was obtained and is negative except as noted above in HPI.     ?    Neurological Examination:   BP (!) 158/59   Pulse 95   Temp 98.8 F (37.1 C) (Axillary)   Resp 17   Ht 1.575 m (5' 2)   Wt 121.4 kg (267 lb 10.2 oz)   SpO2 94%   BMI 48.95 kg/m    Assisted by Norris, RN   Mental status:  Patient was Alert.  Speech was fluent and articulate. Mental status exam was grossly within normal limits.  Cranial nerves:   Pupils were equally reactive. EOMI.  There was no facial  weakness.   There was a good shrug.   Tongue protruded on the midline.   Motor:   Antigravity throughout  Sensory:   Intact Sensation to LT in BUE and BLE and bilateral face  Coordination:   There was no dysmetria.  Gait:   Not tested due to fall concerns       ?        PMH:   Past Medical History:   Diagnosis Date    Anxiety     Depression     Gastrointestinal disorder     Heart failure (HCC)     Hyperlipidemia     Hypertension     Liver disease 2009    liver failure    Pneumonia       PSH:   Past Surgical History:   Procedure Laterality Date    ADENOIDECTOMY  1972    BACK SURGERY      cement discs  following a fall    CATARACT REMOVAL      CESAREAN SECTION  1986 and 1993    DILATION AND CURETTAGE OF UTERUS  1999    EGD TRANSORAL BIOPSY SINGLE/MULTIPLE  05/17/2009         HYSTERECTOMY (CERVIX STATUS UNKNOWN)  2008    TONSILLECTOMY  1972    UPPER GASTROINTESTINAL ENDOSCOPY N/A 04/17/2024    ESOPHAGOGASTRODUODENOSCOPY performed by Madlyn Fendt, MD at [facility] ENDOSCOPY      Allergies:   Allergies   Allergen Reactions    Latex Rash      FH:   Family History   Problem Relation Age of Onset    Cancer Mother     Heart Disease Father       SH:   Social History     Tobacco Use    Smoking status: Former     Current packs/day: 0.00     Types: Cigarettes     Quit date: 02/28/2008     Years since quitting: 16.2    Smokeless tobacco: Never   Vaping Use    Vaping status: Never Used   Substance Use Topics    Alcohol use: Never    Drug use: Never      Meds:   Current Facility-Administered Medications   Medication Dose Route Frequency Provider Last Rate Last Admin    insulin  glargine (LANTUS ) injection vial 10 Units  10 Units SubCUTAneous Daily Nunna, Krishidhar R, MD   10 Units at 06/06/24 0822    ipratropium 0.5 mg-albuterol  2.5 mg (DUONEB ) nebulizer solution 1 Dose  1 Dose Inhalation Q4H PRN Nunna, Krishidhar R, MD   1 Dose at 06/06/24 1000    insulin  lispro (HUMALOG ,ADMELOG ) injection vial 0-8 Units  0-8 Units SubCUTAneous Q4H Nunna, Krishidhar R, MD   2 Units at 06/07/24 0427    0.9 % sodium chloride  infusion   IntraVENous PRN Nunna, Krishidhar R, MD        atorvastatin  (LIPITOR ) tablet 80 mg  80 mg Oral Nightly Nunna, Krishidhar R, MD        aspirin  chewable tablet 81 mg  81 mg Oral Daily Nunna, Krishidhar R, MD   81 mg at 06/07/24 1044    ipratropium 0.5 mg-albuterol  2.5 mg (DUONEB ) nebulizer solution 1 Dose  1 Dose Inhalation Q4H WA RT Nunna, Krishidhar R, MD   1 Dose at 06/07/24 0718    albuterol  (PROVENTIL ) nebulizer solution 2.5 mg  2.5 mg  Nebulization Q4H PRN Nunna, Krishidhar R, MD        [Held by provider] tiotropium  bromide (SPIRIVA  RESPIMAT) 2.5 MCG/ACT inhaler 2 puff  2 puff Inhalation Daily RT Nunna, Krishidhar R, MD        [Held by provider] budesonide -formoterol  (SYMBICORT ) 160-4.5 MCG/ACT inhaler 2 puff  2 puff Inhalation BID RT Nunna, Krishidhar R, MD        hydrALAZINE  (APRESOLINE ) injection 10 mg  10 mg IntraVENous Q4H PRN Sahib, Taaliba A, APRN - NP   10 mg at 06/07/24 0202    [Held by provider] bumetanide  (BUMEX ) injection 1 mg  1 mg IntraVENous Daily Marvis Lynwood HERO, APRN - CNP        cefTRIAXone  (ROCEPHIN ) 2,000 mg in sterile water  20 mL IV syringe  2,000 mg IntraVENous Q24H Marvis Lynwood HERO, APRN - CNP   2,000 mg at 06/07/24 0202    azithromycin  (ZITHROMAX ) 500 mg in sodium chloride  0.9 % 250 mL IVPB (Vial2Bag)  500 mg IntraVENous Q24H Nunna, Krishidhar R, MD 250 mL/hr at 06/07/24 1017 500 mg at 06/07/24 1017    glucose chewable tablet 16 g  4 tablet Oral PRN Rawland Veleta SAUNDERS, MD        dextrose  bolus 10% 125 mL  125 mL IntraVENous PRN Nunna, Krishidhar R, MD        Or    dextrose  bolus 10% 250 mL  250 mL IntraVENous PRN Nunna, Krishidhar R, MD        glucagon  injection 1 mg  1 mg SubCUTAneous PRN Nunna, Krishidhar R, MD        dextrose  10 % infusion   IntraVENous Continuous PRN Nunna, Krishidhar R, MD        sertraline  (ZOLOFT ) tablet 100 mg  100 mg Oral Daily Nunna, Krishidhar R, MD   100 mg at 06/07/24 1044    fentaNYL  (SUBLIMAZE ) injection 50 mcg  50 mcg IntraVENous Q2H PRN Nunna, Krishidhar R, MD   50 mcg at 06/05/24 1615    apixaban  (ELIQUIS ) tablet 5 mg  5 mg Per NG tube BID Nunna, Krishidhar R, MD   5 mg at 06/07/24 1044    sodium chloride  flush 0.9 % injection 5-40 mL  5-40 mL IntraVENous 2 times per day Marvis Lynwood HERO, APRN - CNP   10 mL at 06/07/24 1049    sodium chloride  flush 0.9 % injection 5-40 mL  5-40 mL IntraVENous PRN Marvis Lynwood HERO, APRN - CNP        0.9 % sodium chloride  infusion   IntraVENous PRN Marvis Lynwood HERO, APRN - CNP        lidocaine  1 % injection 50 mg  50 mg IntraDERmal Once Marvis Lynwood HERO,  APRN - CNP        ALTEplase  (CATHFLO) injection 2 mg  2 mg IntraCATHeter PRN Marvis Lynwood HERO, APRN - CNP        sodium chloride  flush 0.9 % injection 5-40 mL  5-40 mL IntraVENous 2 times per day Marvis Lynwood HERO, APRN - CNP   10 mL at 06/07/24 1050    sodium chloride  flush 0.9 % injection 5-40 mL  5-40 mL IntraVENous PRN Marvis Lynwood HERO, APRN - CNP        0.9 % sodium chloride  infusion   IntraVENous PRN Marvis Lynwood HERO, APRN - CNP   Stopped at 06/05/24 2344    potassium chloride  20 mEq/50 mL IVPB (Central Line)  20 mEq IntraVENous PRN Marvis Lynwood HERO, APRN -  CNP        Or    potassium chloride  10 mEq/100 mL IVPB (Peripheral Line)  10 mEq IntraVENous PRN Marvis Lynwood HERO, APRN - CNP        magnesium  sulfate 2000 mg in 50 mL IVPB premix  2,000 mg IntraVENous PRN Marvis Lynwood HERO, APRN - CNP        ondansetron  (ZOFRAN -ODT) disintegrating tablet 4 mg  4 mg Oral Q8H PRN Marvis Lynwood HERO, APRN - CNP        Or    ondansetron  (ZOFRAN ) injection 4 mg  4 mg IntraVENous Q6H PRN Marvis Lynwood HERO, APRN - CNP   4 mg at 06/07/24 1021    polyethylene glycol (GLYCOLAX ) packet 17 g  17 g Oral Daily PRN Marvis Lynwood HERO, APRN - CNP        acetaminophen  (TYLENOL ) tablet 650 mg  650 mg Oral Q6H PRN Marvis Lynwood HERO, APRN - CNP        Or    acetaminophen  (TYLENOL ) suppository 650 mg  650 mg Rectal Q6H PRN Marvis Lynwood HERO, APRN - CNP                Labs:     Recent Results (from the past 24 hours)   POCT Glucose    Collection Time: 06/06/24 11:48 AM   Result Value Ref Range    POC Glucose 247 (H) 65 - 100 mg/dL    Performed by: Jacques Perkins    Ammonia    Collection Time: 06/06/24  2:10 PM   Result Value Ref Range    Ammonia 19 11 - 51 umol/L   POCT Glucose    Collection Time: 06/06/24  4:06 PM   Result Value Ref Range    POC Glucose 270 (H) 65 - 100 mg/dL    Performed by: Jacques Perkins    Basic Metabolic Panel    Collection Time: 06/06/24  4:10 PM   Result Value Ref Range    Sodium 137 136 - 145 mmol/L    Potassium 4.3 3.5 - 5.1 mmol/L    Chloride 103 98 - 107 mmol/L     CO2 27 20 - 29 mmol/L    Anion Gap 7 2 - 14 mmol/L    Glucose 248 (H) 65 - 100 mg/dL    BUN 32 (H) 8 - 23 mg/dL    Creatinine 9.22 9.39 - 1.00 mg/dL    BUN/Creatinine Ratio 41 (H) 12 - 20      Est, Glom Filt Rate 87 >59 ml/min/1.89m2    Calcium  8.7 (L) 8.8 - 10.2 mg/dL   POCT Glucose    Collection Time: 06/06/24  8:09 PM   Result Value Ref Range    POC Glucose 208 (H) 65 - 100 mg/dL    Performed by: Alexa Sinclair    POCT Glucose    Collection Time: 06/07/24  4:19 AM   Result Value Ref Range    POC Glucose 193 (H) 65 - 100 mg/dL    Performed by: Alexa Sinclair    CBC    Collection Time: 06/07/24  4:20 AM   Result Value Ref Range    WBC 9.0 3.6 - 11.0 K/uL    RBC 4.01 3.80 - 5.20 M/uL    Hemoglobin 10.3 (L) 11.5 - 16.0 g/dL    Hematocrit 65.9 (L) 35.0 - 47.0 %    MCV 84.8 80.0 - 99.0 FL    MCH 25.7 (L) 26.0 - 34.0 PG  MCHC 30.3 30.0 - 36.5 g/dL    RDW 81.6 (H) 88.4 - 14.5 %    Platelets 184 150 - 400 K/uL    MPV 11.4 8.9 - 12.9 FL    Nucleated RBCs 0.3 (H) 0.0 PER 100 WBC    nRBC 0.03 (H) 0.00 - 0.01 K/uL   Basic Metabolic Panel    Collection Time: 06/07/24  4:20 AM   Result Value Ref Range    Sodium 137 136 - 145 mmol/L    Potassium Hemolyzed, Recollection Recommended 3.5 - 5.1 mmol/L    Chloride 103 98 - 107 mmol/L    CO2 21 20 - 29 mmol/L    Anion Gap 13 2 - 14 mmol/L    Glucose 188 (H) 65 - 100 mg/dL    BUN 28 (H) 8 - 23 mg/dL    Creatinine 9.26 9.39 - 1.00 mg/dL    BUN/Creatinine Ratio 39 (H) 12 - 20      Est, Glom Filt Rate >90 >59 ml/min/1.48m2    Calcium  8.6 (L) 8.8 - 10.2 mg/dL   Magnesium     Collection Time: 06/07/24  4:20 AM   Result Value Ref Range    Magnesium  2.3 1.6 - 2.4 mg/dL   Phosphorus    Collection Time: 06/07/24  4:20 AM   Result Value Ref Range    Phosphorus 2.5 2.5 - 4.5 mg/dL   POCT Glucose    Collection Time: 06/07/24  7:56 AM   Result Value Ref Range    POC Glucose 145 (H) 65 - 100 mg/dL    Performed by: Geralene Netter            Imaging:       CTA HEAD NECK W CONTRAST  Result Date:  06/06/2024  CLINICAL HISTORY: Left-sided weakness EXAMINATION:  CT ANGIOGRAPHY HEAD AND NECK COMPARISON: CT head 06/06/2024 TECHNIQUE:  Following the uneventful administration of iodinated contrast material, axial CT angiography of the head and neck was performed. Delayed axial images through the head were also obtained. Coronal and sagittal reconstructions were obtained. Manual postprocessing of images was performed. 3-D  Sagittal maximal intensity projection images were obtained.  3-D Coronal maximal intensity projections were obtained.  CT dose reduction was achieved through use of a standardized protocol tailored for this examination and automatic exposure control for dose modulation. CT perfusion analysis was performed using CT with contrast administration, including postprocessing of parametric maps with the determination of cerebral blood flow, cerebral blood volume, and mean transit time. CT dose reduction was achieved through use of a standardized protocol tailored for this examination and automatic exposure control for dose modulation. This study was analyzed by the [employer] algorithm FINDINGS: Delayed contrast-enhanced head CT: The ventricles are midline without hydrocephalus.  There is no acute intra or extra-axial hemorrhage. Mild bilateral subcortical and periventricular areas of patchy low attenuation is nonspecific but likely related to changes of chronic small vessel ischemic disease. The basal cisterns are clear.  The paranasal sinuses are clear. CTA NECK: Great vessels: Patent great vessel origins Right subclavian artery: Mild atherosclerosis Left subclavian artery: Mild to moderate proximal atherosclerosis Right common carotid artery: Mild atherosclerosis Left common carotid artery: Mild atherosclerosis Cervical right internal carotid artery: Patent with patent without evidence for significant stenosis by NASCET criteria. Limited evaluation proximally due to motion artifact. Cervical left internal  carotid artery: Patent with patent without evidence for significant stenosis stenosis by NASCET criteria. Limited evaluation proximally due to motion artifact. Right vertebral artery: Patent Left vertebral  artery: Mild proximal atherosclerosis CTA HEAD: Right cavernous internal carotid artery: Mild atherosclerosis Left cavernous internal carotid artery: Mild atherosclerosis Anterior cerebral arteries: Patent Anterior communicating artery: Patent Right middle cerebral artery: Patent Left middle cerebral artery: Patent Posterior communicating arteries: Right is patent. Left is hypoplastic. Posterior cerebral arteries: Patent Basilar artery: Patent Distal vertebral arteries: Left is patent. Hypoplastic distal right V4 segment. Measurements use NASCET criteria. CT perfusion brain: Technically suboptimal perfusion study Enlarged pulmonary arteries compatible with pulmonary hypertension. Biapical airspace disease likely infectious/inflammatory. Moderate cervical spondylosis.     1. No acute large vessel arterial occlusion. Mild to moderate atherosclerotic changes. 2. Pulmonary hypertension. 3. Biapical airspace disease likely infectious/inflammatory. Electronically signed by Aletha CHRISTELLA Ham    CT BRAIN PERFUSION  Result Date: 06/06/2024  CLINICAL HISTORY: Left-sided weakness EXAMINATION:  CT ANGIOGRAPHY HEAD AND NECK COMPARISON: CT head 06/06/2024 TECHNIQUE:  Following the uneventful administration of iodinated contrast material, axial CT angiography of the head and neck was performed. Delayed axial images through the head were also obtained. Coronal and sagittal reconstructions were obtained. Manual postprocessing of images was performed. 3-D  Sagittal maximal intensity projection images were obtained.  3-D Coronal maximal intensity projections were obtained.  CT dose reduction was achieved through use of a standardized protocol tailored for this examination and automatic exposure control for dose modulation. CT  perfusion analysis was performed using CT with contrast administration, including postprocessing of parametric maps with the determination of cerebral blood flow, cerebral blood volume, and mean transit time. CT dose reduction was achieved through use of a standardized protocol tailored for this examination and automatic exposure control for dose modulation. This study was analyzed by the [employer] algorithm FINDINGS: Delayed contrast-enhanced head CT: The ventricles are midline without hydrocephalus.  There is no acute intra or extra-axial hemorrhage. Mild bilateral subcortical and periventricular areas of patchy low attenuation is nonspecific but likely related to changes of chronic small vessel ischemic disease. The basal cisterns are clear.  The paranasal sinuses are clear. CTA NECK: Great vessels: Patent great vessel origins Right subclavian artery: Mild atherosclerosis Left subclavian artery: Mild to moderate proximal atherosclerosis Right common carotid artery: Mild atherosclerosis Left common carotid artery: Mild atherosclerosis Cervical right internal carotid artery: Patent with patent without evidence for significant stenosis by NASCET criteria. Limited evaluation proximally due to motion artifact. Cervical left internal carotid artery: Patent with patent without evidence for significant stenosis stenosis by NASCET criteria. Limited evaluation proximally due to motion artifact. Right vertebral artery: Patent Left vertebral artery: Mild proximal atherosclerosis CTA HEAD: Right cavernous internal carotid artery: Mild atherosclerosis Left cavernous internal carotid artery: Mild atherosclerosis Anterior cerebral arteries: Patent Anterior communicating artery: Patent Right middle cerebral artery: Patent Left middle cerebral artery: Patent Posterior communicating arteries: Right is patent. Left is hypoplastic. Posterior cerebral arteries: Patent Basilar artery: Patent Distal vertebral arteries: Left is patent.  Hypoplastic distal right V4 segment. Measurements use NASCET criteria. CT perfusion brain: Technically suboptimal perfusion study Enlarged pulmonary arteries compatible with pulmonary hypertension. Biapical airspace disease likely infectious/inflammatory. Moderate cervical spondylosis.     1. No acute large vessel arterial occlusion. Mild to moderate atherosclerotic changes. 2. Pulmonary hypertension. 3. Biapical airspace disease likely infectious/inflammatory. Electronically signed by Aletha CHRISTELLA Ham    CT HEAD WO CONTRAST  Result Date: 06/06/2024  EXAM: CT CODE NEURO HEAD WO CONTRAST INDICATION: left sided weakness and left hemineglect COMPARISON: 06/04/2024. CONTRAST: None. TECHNIQUE: Unenhanced CT of the head was performed using 5 mm images. Brain and bone windows were generated.  Coronal and sagittal reformats. CT dose reduction was achieved through use of a standardized protocol tailored for this examination and automatic exposure control for dose modulation.  FINDINGS: The bone windows demonstrate no abnormalities. The visualized portions of the paranasal sinuses and mastoid air cells are clear. The ventricles and sulci are age-appropriate and symmetric. [name] The basilar cisterns are open.  There is no intracranial hemorrhage, extra-axial collection, or mass effect.  No CT evidence of acute infarct.     No acute intracranial findings. Electronically signed by PAULINE BLANCH           Video Attestation:             ?I discussed the risks and benefits of a telehealth visit and I obtained the patient's or legally authorized representative's informed verbal consent to conduct this assessment using telehealth tools.? All of the patients or legally authorized representative's questions regarding the telehealth interaction were addressed. I provided my name and disclosed to the patient or legally authorized representative my licensure, certification, or registration. ?This consultation was remotely performed at the  request of Azeem, Ahad, MD with the assistance of a trained virtual health liaison working at the originating site.? The consultation used real time licensed and encrypted telehealth equipment which allowed a live video connection between my location and the patient's location, [facility].? Aspects of the evaluation that could not be adequately evaluated by virtual assessment were shared with both the patient and the consulting provider.?          A total of 45 minutes of time was spent in direct care and coordination of care with the patient.      "

## 2024-06-07 NOTE — Progress Notes (Signed)
"  Per chart review: Current diet order of Regular solids and thin liquids.  Patient sleeping soundly upon entry of room, easily awakens. WOB with HFNC present. Patient declines po trials. Rn reports patient has been declining po intake Concerns present for nutrient intake and patient might benefit from alternate source of nutrient intake. Rec: ST reassessment prior to initiation of diet s/t patient WOB without po intake. ST to follow on 06/08/24 for reassessment attempt.   "

## 2024-06-07 NOTE — Progress Notes (Signed)
"  Tele-neuro call at this time for general rounding. This nurse spoke with Kat   "

## 2024-06-07 NOTE — Progress Notes (Signed)
 "  CRITICAL CARE PROGRESS NOTE    Name: Breanna Lester   DOB: Jan 03, 1961   MRN: [id number]   Date: 06/07/2024      Diagnoses/problem list:   Acute on chronic hypoxemic respiratory failure  Severe bilateral pneumonia  Acute kidney injury  Recreational drug use  Chronic use of 3 lit nasal cannula  Afib - eliquis    Liver cirrhosis, alcohol induced     HPI & Hospital Course:   63 year old obese female with COPD on 3 L home O2, anxiety, depression, HTN, A-fib on Eliquis , NIDDM type II, pancreatitis and EtOH cirrhosis who presented to the ER on 10/14 with complaints of worsening shortness of breath. She was severely hypoxic in the ER with increased work of breathing which did not improve with BiPAP and she was eventually intubated. CT findings concerning for severe bilateral pneumonia.       10/16: Remains intubated, 35% FiO2 and PEEP of 8. Will try SAT and SBT today.   10/17: Doing well on SBT this morning. Extubated to Bipap. Na low on AM labs  10/18: Currently on 35% high-flow. Does not want to get out of bed or eat anything. Subjective feelings of shortness of breath.     Assessment and plan:     1. Acute encephalopathy   - Recreational drug use  - UDS positive for amphetamines   - CTA head & neck negative for CVA  - Might need EEG in case of recurrent symptoms  - Appreciate help for tele-neurology service     2. Severe bilateral pneumonia  - Extubated 10/17  - Currently on 35% high-flow  - Empiric coverage with azithro + ceftriaxone   - Trend procalcitonin   - Stop steroids      3. Afib  - Resume home dose eliquis       4. Thrombocytopenia   - Likely from liver cirrhosis        SYSTEMS     Neuro - Awake  Pulm - Wean FiO2  CVS - No issues  GI - Encourage PO intake   Renal - No issues    DAILY CHECKLIST     Code Status: Full code  DVT Prophylaxis: SCDs/Eliquis   GI Prophylaxis: Not needed  Feeding: Po diet  ABCDEF Bundle/Checklist Completed: Yes  Disposition: Stay in ICU  Social: Discussed with patient     OBJECTIVE:      Blood pressure (!) 152/67, pulse 83, temperature 97.8 F (36.6 C), temperature source Oral, resp. rate 25, height 1.575 m (5' 2), weight 121.4 kg (267 lb 10.2 oz), SpO2 94%.    Physical Exam  General: Well appearing obese female, in no distress.    Skin: No rash  Head: Normocephalic, atraumatic.  Eyes: Conjunctiva clear, sclera non-icteric, EOM intact.    Neck: Supple, without lesions.   Heart: + Murmur; regular rate and rhythm.  Lungs: Clear to auscultation. No wheezing, rales or rhonchi.   Abdomen: Bowel sounds normal, no tenderness, organomegaly, masses, or hernia.   Extremities: No cyanosis, peripheral pulses intact. 1+ pitting edema.   Neurologic: CN 2-12 normal. Sensation to pain, touch, and proprioception normal.   Psychiatric: Oriented x 3, normal mood and affect.      Labs and Data: Reviewed 06/07/24  Medications: Reviewed 06/07/24  Imaging: Reviewed 06/07/24    Intake/Output:     Intake/Output Summary (Last 24 hours) at 06/07/2024 1238  Last data filed at 06/07/2024 1155  Gross per 24 hour   Intake 844.21 ml   Output  1900 ml   Net -1055.79 ml       CRITICAL CARE DOCUMENTATION  I had a face to face encounter with the patient, reviewed and interpreted patient data including clinical events, labs, images, vital signs, I/O's, and examined patient.  I have discussed the case and the plan and management of the patient's care with the consulting services, the bedside nurses and the respiratory therapist.      NOTE OF PERSONAL INVOLVEMENT IN CARE   This patient has a high probability of imminent, clinically significant deterioration, which requires the highest level of preparedness to intervene urgently. I participated in the decision-making and personally managed or directed the management of the following life and organ supporting interventions that required my frequent assessment to treat or prevent imminent deterioration.    I personally spent 45 minutes of critical care time.  This is time spent at this  critically ill patient's bedside actively involved in patient care as well as the coordination of care.  This does not include any procedural time which has been billed separately.    Dr. Stefany Starace  Intensivist  "

## 2024-06-08 LAB — BASIC METABOLIC PANEL
Anion Gap: 9 mmol/L (ref 2–14)
BUN/Creatinine Ratio: 40 — ABNORMAL HIGH (ref 12–20)
BUN: 26 mg/dL — ABNORMAL HIGH (ref 8–23)
CO2: 29 mmol/L (ref 20–29)
Calcium: 8.5 mg/dL — ABNORMAL LOW (ref 8.8–10.2)
Chloride: 106 mmol/L (ref 98–107)
Creatinine: 0.65 mg/dL (ref 0.60–1.00)
Est, Glom Filt Rate: >90 ml/min/1.73m2 (ref >59)
Glucose: 131 mg/dL — ABNORMAL HIGH (ref 65–100)
Potassium: 4.1 mmol/L (ref 3.5–5.1)
Sodium: 144 mmol/L (ref 136–145)

## 2024-06-08 LAB — CBC
Hematocrit: 34 % — ABNORMAL LOW (ref 35.0–47.0)
Hemoglobin: 10.2 g/dL — ABNORMAL LOW (ref 11.5–16.0)
MCH: 25.8 pg — ABNORMAL LOW (ref 26.0–34.0)
MCHC: 30 g/dL (ref 30.0–36.5)
MCV: 85.9 FL (ref 80.0–99.0)
MPV: 10.4 FL (ref 8.9–12.9)
Nucleated RBCs: 0.4 /100{WBCs} — ABNORMAL HIGH
Platelets: 147 K/uL — ABNORMAL LOW (ref 150–400)
RBC: 3.96 M/uL (ref 3.80–5.20)
RDW: 18 % — ABNORMAL HIGH (ref 11.5–14.5)
WBC: 7 K/uL (ref 3.6–11.0)
nRBC: 0.03 K/uL — ABNORMAL HIGH (ref 0.00–0.01)

## 2024-06-08 LAB — EKG 12-LEAD
Atrial Rate: 104 {beats}/min
Q-T Interval: 362 ms
QRS Duration: 80 ms
QTc Calculation (Bazett): 485 ms
R Axis: 63 degrees
T Axis: 53 degrees
Ventricular Rate: 108 {beats}/min

## 2024-06-08 LAB — POCT GLUCOSE
POC Glucose: 117 mg/dL — ABNORMAL HIGH (ref 65–100)
POC Glucose: 129 mg/dL — ABNORMAL HIGH (ref 65–100)
POC Glucose: 137 mg/dL — ABNORMAL HIGH (ref 65–100)
POC Glucose: 163 mg/dL — ABNORMAL HIGH (ref 65–100)
POC Glucose: 169 mg/dL — ABNORMAL HIGH (ref 65–100)
POC Glucose: 201 mg/dL — ABNORMAL HIGH (ref 65–100)

## 2024-06-08 LAB — PROCALCITONIN: Procalcitonin: 0.12 ng/mL

## 2024-06-08 LAB — MAGNESIUM: Magnesium: 2.3 mg/dL (ref 1.6–2.4)

## 2024-06-08 LAB — PHOSPHORUS: Phosphorus: 2.1 mg/dL — ABNORMAL LOW (ref 2.5–4.5)

## 2024-06-08 MED ORDER — METOPROLOL TARTRATE 50 MG PO TABS
50 | Freq: Two times a day (BID) | ORAL | Status: DC
Start: 2024-06-08 — End: 2024-06-16
  Administered 2024-06-08 – 2024-06-16 (×12): 50 mg via ORAL

## 2024-06-08 MED ORDER — BUDESONIDE 0.5 MG/2ML IN SUSP
0.5 | Freq: Two times a day (BID) | RESPIRATORY_TRACT | Status: DC
Start: 2024-06-08 — End: 2024-06-16
  Administered 2024-06-08 – 2024-06-16 (×18): 500 mg via RESPIRATORY_TRACT

## 2024-06-08 MED ORDER — SODIUM CHLORIDE 0.9 % IV SOLN
0.9 | Freq: Once | INTRAVENOUS | Status: AC
Start: 2024-06-08 — End: 2024-06-08
  Administered 2024-06-08: 13:00:00 20 mmol via INTRAVENOUS

## 2024-06-08 MED ORDER — OLANZAPINE 10 MG PO TABS
10 | Freq: Every evening | ORAL | Status: DC
Start: 2024-06-08 — End: 2024-06-16
  Administered 2024-06-09 – 2024-06-16 (×8): 10 mg via ORAL

## 2024-06-08 MED FILL — ELIQUIS 5 MG PO TABS: 5 mg | ORAL | Qty: 1 | Fill #0

## 2024-06-08 MED FILL — HYDRALAZINE HCL 20 MG/ML IJ SOLN: 20 mg/mL | INTRAMUSCULAR | Qty: 1 | Fill #0

## 2024-06-08 MED FILL — SERTRALINE HCL 50 MG PO TABS: 50 mg | ORAL | Qty: 2 | Fill #0

## 2024-06-08 MED FILL — BUDESONIDE 0.5 MG/2ML IN SUSP: 0.5 MG/2ML | RESPIRATORY_TRACT | Qty: 2 | Fill #0

## 2024-06-08 MED FILL — METOPROLOL TARTRATE 50 MG PO TABS: 50 mg | ORAL | Qty: 1 | Fill #0

## 2024-06-08 MED FILL — HALOPERIDOL LACTATE 5 MG/ML IJ SOLN: 5 mg/mL | INTRAMUSCULAR | Qty: 1 | Fill #0

## 2024-06-08 MED FILL — OLANZAPINE 5 MG PO TABS: 5 mg | ORAL | Qty: 1 | Fill #0

## 2024-06-08 MED FILL — TYLENOL 325 MG PO TABS: 325 mg | ORAL | Qty: 2 | Fill #0

## 2024-06-08 MED FILL — CEFTRIAXONE SODIUM 2 G IJ SOLR: 2 g | INTRAMUSCULAR | Qty: 2000 | Fill #0

## 2024-06-08 MED FILL — IPRATROPIUM-ALBUTEROL 0.5-2.5 (3) MG/3ML IN SOLN: 0.5-2.5 (3) MG/3ML | RESPIRATORY_TRACT | Qty: 3 | Fill #0

## 2024-06-08 MED FILL — BUMETANIDE 0.25 MG/ML IJ SOLN: 0.25 mg/mL | INTRAMUSCULAR | Qty: 4 | Fill #0

## 2024-06-08 MED FILL — INSULIN LISPRO 100 UNIT/ML IJ SOLN: 100 [IU]/mL | INTRAMUSCULAR | Qty: 2 | Fill #0

## 2024-06-08 MED FILL — ATORVASTATIN CALCIUM 40 MG PO TABS: 40 mg | ORAL | Qty: 1 | Fill #0

## 2024-06-08 MED FILL — AZITHROMYCIN 500 MG IV SOLR: 500 mg | INTRAVENOUS | Qty: 500 | Fill #0

## 2024-06-08 MED FILL — SODIUM PHOSPHATES 15 MMOLE/5ML IV SOLN: 15 MMOLE/5ML | INTRAVENOUS | Qty: 6.67 | Fill #0

## 2024-06-08 NOTE — Progress Notes (Signed)
"  Patient's own medication was given to the security department on 06/06/24.  Daughter of the patient was informed about this.  "

## 2024-06-08 NOTE — Progress Notes (Signed)
"  Patient resting upon entry of room, HFNC present. Patient continues to decline po intake for swallow assessment. Rn made aware. ST to follow on 06/09/24.   "

## 2024-06-08 NOTE — Progress Notes (Signed)
 "  CRITICAL CARE PROGRESS NOTE    Name: Breanna Lester   DOB: [birth year]   MRN: [id number]   Date: 06/08/2024      Diagnoses/problem list:   Acute on chronic hypoxemic respiratory failure  Severe bilateral pneumonia  Acute kidney injury  Recreational drug use  Chronic use of 3 lit nasal cannula  Afib - eliquis    Liver cirrhosis, alcohol induced    HPI & Hospital Course:   63 year old obese female with COPD on 3 L home O2, anxiety, depression, HTN, A-fib on Eliquis , NIDDM type II, pancreatitis and EtOH cirrhosis who presented to the ER on 10/14 with complaints of worsening shortness of breath. She was severely hypoxic in the ER with increased work of breathing which did not improve with BiPAP and she was eventually intubated. CT findings concerning for severe bilateral pneumonia.        10/16: Remains intubated, 35% FiO2 and PEEP of 8. Will try SAT and SBT today.   10/17: Doing well on SBT this morning. Extubated to Bipap. Na low on AM labs  10/18: Currently on 35% high-flow. Does not want to get out of bed or eat anything. Subjective feelings of shortness of breath.   10/19: Currently on 35% high-flow. Confused and seen by psychiatry. A-fib with RVR.     Assessment and plan:     1. Acute encephalopathy   - Recreational drug use in outpatient setting   - UDS positive for amphetamines   - CTA head & neck negative for CVA  - Might need EEG in case of recurrent symptoms  - Appreciate help for tele-neurology service   - Seen by psychiatry, on zyprexa       2. Severe bilateral pneumonia  - Extubated 10/17  - Currently on 35% high-flow  - Empiric coverage with ceftriaxone , completed azithromycin      3. Afib  - Resume home dose eliquis   - Rate control with metoprolol         4. Thrombocytopenia   - Likely from liver cirrhosis      SYSTEMS     Neuro - Awake  Pulm - Wean FiO2  CVS - No issues  GI - Encourage PO intake   Renal - No issues    DAILY CHECKLIST     Code Status: Full code  DVT Prophylaxis: SCDs/Eliquis   GI Prophylaxis:  Not needed  Feeding: Po diet  ABCDEF Bundle/Checklist Completed: Yes  Disposition: Stay in ICU  Social: Discussed with patient & daughter     OBJECTIVE:     Blood pressure (!) 122/48, pulse 71, temperature 98.1 F (36.7 C), temperature source Oral, resp. rate 26, height 1.575 m (5' 2), weight 118 kg (260 lb 2.3 oz), SpO2 96%.    Physical Exam  General: Well appearing obese female, in no distress.    Skin: No rash  Head: Normocephalic, atraumatic.  Eyes: Conjunctiva clear, sclera non-icteric, EOM intact.    Neck: Supple, without lesions.   Heart: + Murmur; regular rate and rhythm.  Lungs: Clear to auscultation. No wheezing, rales or rhonchi.   Abdomen: Bowel sounds normal, no tenderness, organomegaly, masses, or hernia.   Extremities: No cyanosis, peripheral pulses intact. 1+ pitting edema.   Neurologic: CN 2-12 normal. Sensation to pain, touch, and proprioception normal.   Psychiatric: Oriented x 3, normal mood and affect.    Labs and Data: Reviewed 06/08/24  Medications: Reviewed 06/08/24  Imaging: Reviewed 06/08/24    Intake/Output:     Intake/Output Summary (Last 24  hours) at 06/08/2024 1251  Last data filed at 06/08/2024 1227  Gross per 24 hour   Intake 709.78 ml   Output 3300 ml   Net -2590.22 ml       CRITICAL CARE DOCUMENTATION  I had a face to face encounter with the patient, reviewed and interpreted patient data including clinical events, labs, images, vital signs, I/O's, and examined patient.  I have discussed the case and the plan and management of the patient's care with the consulting services, the bedside nurses and the respiratory therapist.      NOTE OF PERSONAL INVOLVEMENT IN CARE   This patient has a high probability of imminent, clinically significant deterioration, which requires the highest level of preparedness to intervene urgently. I participated in the decision-making and personally managed or directed the management of the following life and organ supporting interventions that required my  frequent assessment to treat or prevent imminent deterioration.    I personally spent 40 minutes of critical care time.  This is time spent at this critically ill patient's bedside actively involved in patient care as well as the coordination of care.  This does not include any procedural time which has been billed separately.    Dr. Asanti Craigo  Intensivist  "
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
## 2024-06-08 NOTE — Progress Notes (Signed)
"  Informed Dr. Cliffton, S.  through phone call for new psychiatry consultation.  "

## 2024-06-08 NOTE — Plan of Care (Signed)
"    Problem: Chronic Conditions and Co-morbidities  Goal: Patient's chronic conditions and co-morbidity symptoms are monitored and maintained or improved  06/07/2024 2203 by Tilman Millman, RN  Outcome: Progressing     Problem: Neurosensory - Adult  Goal: Achieves stable or improved neurological status  06/08/2024 1136 by Jamee Brambleton, RN  Outcome: Not Progressing  Flowsheets (Taken 06/08/2024 1136)  Achieves stable or improved neurological status: Assess for and report changes in neurological status  06/07/2024 2203 by Tilman Millman, RN  Outcome: Progressing  Goal: Absence of seizures  06/07/2024 2203 by Tilman Millman, RN  Outcome: Progressing     Problem: Respiratory - Adult  Goal: Achieves optimal ventilation and oxygenation  Outcome: Progressing  Flowsheets (Taken 06/08/2024 1136)  Achieves optimal ventilation and oxygenation: Assess for changes in respiratory status     Problem: Cardiovascular - Adult  Goal: Maintains optimal cardiac output and hemodynamic stability  Outcome: Progressing  Flowsheets (Taken 06/08/2024 1136)  Maintains optimal cardiac output and hemodynamic stability: Monitor blood pressure and heart rate     Problem: Skin/Tissue Integrity - Adult  Goal: Skin integrity remains intact  Description: 1.  Monitor for areas of redness and/or skin breakdown  2.  Assess vascular access sites hourly  3.  Every 4-6 hours minimum:  Change oxygen saturation probe site  4.  Every 4-6 hours:  If on nasal continuous positive airway pressure, respiratory therapy assess nares and determine need for appliance change or resting period  06/08/2024 1136 by Jamee Brambleton, RN  Outcome: Progressing  Flowsheets (Taken 06/08/2024 1136)  Skin Integrity Remains Intact: Monitor for areas of redness and/or skin breakdown  06/07/2024 2203 by Tilman Millman, RN  Outcome: Progressing  Goal: Incisions, wounds, or drain sites healing without S/S of infection  06/07/2024 2203 by Tilman Millman, RN  Outcome: Progressing  Goal:  Oral mucous membranes remain intact  06/07/2024 2203 by Tilman Millman, RN  Outcome: Progressing     Problem: Metabolic/Fluid and Electrolytes - Adult  Goal: Electrolytes maintained within normal limits  Outcome: Progressing  Flowsheets (Taken 06/08/2024 1136)  Electrolytes maintained within normal limits: Monitor labs and assess patient for signs and symptoms of electrolyte imbalances     Problem: Pain  Goal: Verbalizes/displays adequate comfort level or baseline comfort level  Outcome: Progressing  Flowsheets (Taken 06/08/2024 1136)  Verbalizes/displays adequate comfort level or baseline comfort level: Encourage patient to monitor pain and request assistance     Problem: Skin/Tissue Integrity  Goal: Skin integrity remains intact  Description: 1.  Monitor for areas of redness and/or skin breakdown  2.  Assess vascular access sites hourly  3.  Every 4-6 hours minimum:  Change oxygen saturation probe site  4.  Every 4-6 hours:  If on nasal continuous positive airway pressure, respiratory therapy assess nares and determine need for appliance change or resting period  06/08/2024 1136 by Jamee Brambleton, RN  Outcome: Progressing  Flowsheets (Taken 06/08/2024 1136)  Skin Integrity Remains Intact: Monitor for areas of redness and/or skin breakdown  06/07/2024 2203 by Tilman Millman, RN  Outcome: Progressing     Problem: Neurosensory - Adult  Goal: Achieves stable or improved neurological status  06/08/2024 1136 by Jamee Brambleton, RN  Outcome: Not Progressing  Flowsheets (Taken 06/08/2024 1136)  Achieves stable or improved neurological status: Assess for and report changes in neurological status  06/07/2024 2203 by Tilman Millman, RN  Outcome: Progressing     "

## 2024-06-09 LAB — POCT GLUCOSE
POC Glucose: 106 mg/dL — ABNORMAL HIGH (ref 65–100)
POC Glucose: 118 mg/dL — ABNORMAL HIGH (ref 65–100)
POC Glucose: 120 mg/dL — ABNORMAL HIGH (ref 65–100)
POC Glucose: 127 mg/dL — ABNORMAL HIGH (ref 65–100)
POC Glucose: 127 mg/dL — ABNORMAL HIGH (ref 65–100)
POC Glucose: 144 mg/dL — ABNORMAL HIGH (ref 65–100)

## 2024-06-09 LAB — COMPREHENSIVE METABOLIC PANEL
ALT: 21 U/L (ref 10–35)
AST: 33 U/L (ref 10–35)
Albumin/Globulin Ratio: 0.7 — ABNORMAL LOW (ref 1.1–2.2)
Albumin: 2.3 g/dL — ABNORMAL LOW (ref 3.5–5.2)
Alk Phosphatase: 84 U/L (ref 35–104)
Anion Gap: 7 mmol/L (ref 2–14)
BUN/Creatinine Ratio: 31 — ABNORMAL HIGH (ref 12–20)
BUN: 20 mg/dL (ref 8–23)
CO2: 34 mmol/L — ABNORMAL HIGH (ref 20–29)
Calcium: 8.3 mg/dL — ABNORMAL LOW (ref 8.8–10.2)
Chloride: 102 mmol/L (ref 98–107)
Creatinine: 0.66 mg/dL (ref 0.60–1.00)
Est, Glom Filt Rate: >90 ml/min/1.73m2 (ref >59)
Globulin: 3.3 g/dL (ref 2.0–4.0)
Glucose: 131 mg/dL — ABNORMAL HIGH (ref 65–100)
Potassium: 3.8 mmol/L (ref 3.5–5.1)
Sodium: 142 mmol/L (ref 136–145)
Total Bilirubin: 1 mg/dL (ref 0.0–1.2)
Total Protein: 5.6 g/dL — ABNORMAL LOW (ref 6.4–8.3)

## 2024-06-09 LAB — CBC
Hematocrit: 34.7 % — ABNORMAL LOW (ref 35.0–47.0)
Hemoglobin: 10.1 g/dL — ABNORMAL LOW (ref 11.5–16.0)
MCH: 25.2 pg — ABNORMAL LOW (ref 26.0–34.0)
MCHC: 29.1 g/dL — ABNORMAL LOW (ref 30.0–36.5)
MCV: 86.5 FL (ref 80.0–99.0)
MPV: 11.4 FL (ref 8.9–12.9)
Nucleated RBCs: 0.5 /100{WBCs} — ABNORMAL HIGH
Platelets: 132 K/uL — ABNORMAL LOW (ref 150–400)
RBC: 4.01 M/uL (ref 3.80–5.20)
RDW: 17.6 % — ABNORMAL HIGH (ref 11.5–14.5)
WBC: 6.5 K/uL (ref 3.6–11.0)
nRBC: 0.03 K/uL — ABNORMAL HIGH (ref 0.00–0.01)

## 2024-06-09 LAB — BLOOD GAS, ARTERIAL
Base Excess, Arterial: 9.7 mmol/L — ABNORMAL HIGH (ref 0–3)
Carboxyhgb, Arterial: 1.1 % (ref 1–2)
FIO2 Arterial: 32 %
HCO3, Arterial: 37 mmol/L — ABNORMAL HIGH (ref 22–26)
Methemoglobin, Arterial: 0.3 % (ref 0–1.4)
O2 Flow Rate: 3 L/min
O2 Sat, Arterial: 96 % (ref 95–99)
Oxyhemoglobin: 94.3 % — ABNORMAL LOW (ref 95–99)
Temperature: 97.7
pCO2, Arterial: 60 mmHg — ABNORMAL HIGH (ref 35–45)
pH, Arterial: 7.4 (ref 7.35–7.45)
pO2, Arterial: 81 mmHg (ref 80–100)

## 2024-06-09 LAB — MAGNESIUM: Magnesium: 2 mg/dL (ref 1.6–2.4)

## 2024-06-09 LAB — PHOSPHORUS: Phosphorus: 2.6 mg/dL (ref 2.5–4.5)

## 2024-06-09 LAB — PROCALCITONIN: Procalcitonin: 0.11 ng/mL

## 2024-06-09 MED ORDER — BUMETANIDE 0.25 MG/ML IJ SOLN
0.25 | Freq: Every day | INTRAMUSCULAR | Status: AC
Start: 2024-06-09 — End: 2024-06-13
  Administered 2024-06-10: 12:00:00 1 mg via INTRAVENOUS

## 2024-06-09 MED FILL — OLANZAPINE 10 MG PO TABS: 10 mg | ORAL | Qty: 1 | Fill #0

## 2024-06-09 MED FILL — BUMETANIDE 0.25 MG/ML IJ SOLN: 0.25 mg/mL | INTRAMUSCULAR | Qty: 4 | Fill #0

## 2024-06-09 MED FILL — ELIQUIS 5 MG PO TABS: 5 mg | ORAL | Qty: 1 | Fill #0

## 2024-06-09 MED FILL — METOPROLOL TARTRATE 50 MG PO TABS: 50 mg | ORAL | Qty: 1 | Fill #0

## 2024-06-09 MED FILL — IPRATROPIUM-ALBUTEROL 0.5-2.5 (3) MG/3ML IN SOLN: 0.5-2.5 (3) MG/3ML | RESPIRATORY_TRACT | Qty: 3 | Fill #0

## 2024-06-09 MED FILL — ATORVASTATIN CALCIUM 40 MG PO TABS: 40 mg | ORAL | Qty: 1 | Fill #0

## 2024-06-09 MED FILL — CEFTRIAXONE SODIUM 2 G IJ SOLR: 2 g | INTRAMUSCULAR | Qty: 2000 | Fill #0

## 2024-06-09 MED FILL — SERTRALINE HCL 50 MG PO TABS: 50 mg | ORAL | Qty: 2 | Fill #0

## 2024-06-09 MED FILL — LANTUS 100 UNIT/ML SC SOLN: 100 [IU]/mL | SUBCUTANEOUS | Qty: 10 | Fill #0

## 2024-06-09 MED FILL — BUDESONIDE 0.5 MG/2ML IN SUSP: 0.5 MG/2ML | RESPIRATORY_TRACT | Qty: 2 | Fill #0

## 2024-06-09 NOTE — Progress Notes (Signed)
 "  CRITICAL CARE PROGRESS NOTE    Name: TASNEEM CORMIER   DOB: 08-13-1961   MRN: [id number]   Date: 06/09/2024      Diagnoses/problem list:   Acute on chronic hypoxemic respiratory failure  Severe bilateral pneumonia  Recreational drug use  Chronic use of 3 lit nasal cannula  Afib - eliquis    Liver cirrhosis, alcohol induced  Nocturnal C-pap use     HPI & Hospital Course:   63 year old obese female with COPD on 3 L home O2, anxiety, depression, HTN, A-fib on Eliquis , NIDDM type II, pancreatitis and EtOH cirrhosis who presented to the ER on 10/14 with complaints of worsening shortness of breath. She was severely hypoxic in the ER with increased work of breathing which did not improve with BiPAP and she was eventually intubated. CT findings concerning for severe bilateral pneumonia.     10/16: Remains intubated, 35% FiO2 and PEEP of 8. Will try SAT and SBT today.   10/17: Doing well on SBT this morning. Extubated to Bipap. Na low on AM labs  10/18: Currently on 35% high-flow. Does not want to get out of bed or eat anything. Subjective feelings of shortness of breath.   10/19: Currently on 35% high-flow. Confused and seen by psychiatry. A-fib with RVR.   10/20: Down to midflow nasal cannula. Transfer to medical floor.     Assessment and plan:     1. Acute encephalopathy   - Recreational drug use in outpatient setting   - UDS positive for amphetamines   - CTA head & neck negative for CVA  - Might need EEG in case of recurrent symptoms  - Appreciate help for tele-neurology service   - Seen by psychiatry, on zyprexa       2. Severe bilateral pneumonia  - Extubated 10/17  - Currently on nasal cannula  - Empiric coverage with ceftriaxone , completed azithromycin   - Pending speech evaluation      3. Afib  - Resume home dose eliquis   - Rate control with metoprolol         4. Thrombocytopenia   - Likely from liver cirrhosis      5. OSA  - Nocturnal C-pap     SYSTEMS     Neuro - Awake  Pulm - Wean FiO2  CVS - No issues  GI - Speech  evaluation   Renal - No issues    DAILY CHECKLIST     Code Status: Full code  DVT Prophylaxis: SCDs/Eliquis   GI Prophylaxis: Not needed  Feeding: PO diet  ABCDEF Bundle/Checklist Completed: Yes  Disposition: Transfer to medical floor   Social: Discussed with patient     OBJECTIVE:     Blood pressure (!) 149/77, pulse 71, temperature 97.5 F (36.4 C), temperature source Oral, resp. rate (!) 35, height 1.575 m (5' 2), weight 115 kg (253 lb 8.5 oz), SpO2 100%.    Physical Exam  General: Well appearing obese female, in no distress.    Skin: No rash  Head: Normocephalic, atraumatic.  Eyes: Conjunctiva clear, sclera non-icteric, EOM intact.    Neck: Supple, without lesions.   Heart: + Murmur; regular rate and rhythm.  Lungs: Clear to auscultation. No wheezing, rales or rhonchi.   Abdomen: Bowel sounds normal, no tenderness, organomegaly, masses, or hernia.   Extremities: No cyanosis, peripheral pulses intact. 1+ pitting edema.   Neurologic: CN 2-12 normal. Sensation to pain, touch, and proprioception normal.   Psychiatric: Oriented x 3, normal mood and affect.  Labs and Data: Reviewed 06/09/24  Medications: Reviewed 06/09/24  Imaging: Reviewed 06/09/24    Intake/Output:     Intake/Output Summary (Last 24 hours) at 06/09/2024 1025  Last data filed at 06/09/2024 0300  Gross per 24 hour   Intake 901.5 ml   Output 1700 ml   Net -798.5 ml       CRITICAL CARE DOCUMENTATION  I had a face to face encounter with the patient, reviewed and interpreted patient data including clinical events, labs, images, vital signs, I/O's, and examined patient.  I have discussed the case and the plan and management of the patient's care with the consulting services, the bedside nurses and the respiratory therapist.      NOTE OF PERSONAL INVOLVEMENT IN CARE   This patient has a high probability of imminent, clinically significant deterioration, which requires the highest level of preparedness to intervene urgently. I participated in the  decision-making and personally managed or directed the management of the following life and organ supporting interventions that required my frequent assessment to treat or prevent imminent deterioration.    I personally spent 40 minutes of time.  This is time spent at this critically ill patient's bedside actively involved in patient care as well as the coordination of care.  This does not include any procedural time which has been billed separately.    Dr. Merita Hawks  Intensivist  "

## 2024-06-09 NOTE — Discharge Instr - Lab (Signed)
"  Please call Aleck Ferrari at [phone] for outpatient nutrition counseling scheduling.    "

## 2024-06-09 NOTE — Therapy Evaluation (Signed)
 "PHYSICAL THERAPY EVALUATION  Patient: Breanna Lester (63 y.o. female)  Date: 06/09/2024  Primary Diagnosis: Acute respiratory failure, unspecified whether with hypoxia or hypercapnia (HCC) [J96.00]  Acute on chronic congestive heart failure, unspecified heart failure type (HCC) [I50.9]  Acute hypoxic respiratory failure (HCC) [J96.01]       Precautions: Restrictions/Precautions  Restrictions/Precautions: General Precautions, Fall Risk, Bed Alarm  Activity Level: Up with Assist     Recommendations for nursing mobility: Out of bed to chair for meals, Use of bed/chair alarm for safety, Use of BSC for toileting , AD and gt belt for bed to chair , and Assist x2    In place during session: Nasal Cannula 2-3L, External Catheter, and continuous ICU vital sign monitoring    ASSESSMENT  Pt is a 63 y.o. female admitted on 06/03/2024 for SOB; pt currently being treated for acute exacerbation of chronic respiratory failure, bilateral PNA, AKI, bacteriuria, septic shock, and amphetamine use. Intubated 06/03/24-06/06/24. Pt semi-supine in bed upon PT arrival, agreeable to evaluation. Pt A&O x 4 with intermittent confusion noted during session. Daughter, Harlene, present in room throughout session with permission from pt given.    Based on the objective data described below, the patient currently presents with impaired functional mobility, decreased independence in ADLs, decreased ROM, impaired strength, decreased activity tolerance, poor safety awareness, impaired cognition, decreased command following, impaired balance, and impaired posture. (See below for objective details and assist levels).     Overall pt tolerated session fair today with no c/o pain but noted SOB and decrease SPO2 with mobility on 2-3L (increased to 3L by PT due to decrease SPO2 during EOB transfers to 87%, on 3L continued to decrease to 87% with standing but recovered quicker on 3L, 2L baseline usage). Pt required mod A x 1-2 for bed mobility and transfers.  Pt amb 2 feet with RW, gt belt and mod A x2; demonstrates WBOS with short, shuffling, step gt gt pattern with decreased stance time noted bilaterally but no obvious LOB or knee buckling. Pt initially struggled with clearance of bilaterally feet but able to march in place which improved foot clearance with mobility. Pt will benefit from continued skilled PT to address above deficits and return to PLOF.  Current PT DC recommendation Moderate intensity short-term skilled physical therapy up to 5x/week  once medically appropriate.     Start of Session End of Session   SPO2 (%) 93 2L 95 3L   Heart Rate (BPM) 65 75     GOALS:    Problem: Physical Therapy - Adult  Goal: By Discharge: Performs mobility at highest level of function for planned discharge setting.  See evaluation for individualized goals.  Description: FUNCTIONAL STATUS PRIOR TO ADMISSION: Pt required assistance with ADLs and IADLs    HOME SUPPORT: The patient lived with daughter to provide assistance but is not available to 24/7 for assistance    Physical Therapy Goals  Initiated 06/09/2024  Pt stated goal: to go home  Pt will be I with LE HEP in 7 days.  Pt will perform bed mobility with CGA in 7 days.  Pt will perform transfers with CGA in 7 days.   Pt will amb 25-50 feet with LRAD safely with Contact Guard Assist in 7 days.  Pt will demonstrate improvement in standing balance from Moderate Assist x2 to CGA in 7 days.      Outcome: Progressing     PLAN :  Recommendations and Planned Interventions: bed mobility training, transfer training,  gait training, therapeutic exercises, patient and family training/education, and therapeutic activities    Recommend next PT session: standing static and dynamic balance, ambulation with AD , LE therex, and bed to chair transfers    Frequency/Duration: Patient will be followed by physical therapy:  3-5x/week to address goals.    Recommendation for discharge: (in order for the patient to meet his/her long term  goals)  Moderate intensity short-term skilled physical therapy up to 5x/week     Potential barriers for safe discharge: pt has poor safety awareness, pt has impaired cognition, and pt is not safe to be alone.    IF patient discharges home will need the following DME: continuing to assess with progress       SUBJECTIVE:   Patient stated we're at my home. when asked to describe her home as 1 level or 2.    OBJECTIVE DATA SUMMARY:   HISTORY:    Past Medical History:   Diagnosis Date    Anxiety     Depression     Gastrointestinal disorder     Heart failure (HCC)     Hyperlipidemia     Hypertension     Liver disease 2009    liver failure    Pneumonia      Past Surgical History:   Procedure Laterality Date    ADENOIDECTOMY  1972    BACK SURGERY      cement discs following a fall    CATARACT REMOVAL      CESAREAN SECTION  1986 and 1993    DILATION AND CURETTAGE OF UTERUS  1999    EGD TRANSORAL BIOPSY SINGLE/MULTIPLE  05/17/2009         HYSTERECTOMY (CERVIX STATUS UNKNOWN)  2008    TONSILLECTOMY  1972    UPPER GASTROINTESTINAL ENDOSCOPY N/A 04/17/2024    ESOPHAGOGASTRODUODENOSCOPY performed by Madlyn Fendt, MD at [facility] ENDOSCOPY       Home Situation:  Social/Functional History  Lives With: Daughter  Type of Home:  [facility])  Home Layout: One level  Home Access: Level entry  Bathroom Shower/Tub: Tub/Shower unit  Home Equipment: Oxygen  Has the patient had two or more falls in the past year or any fall with injury in the past year?: Yes (2 falls in the past 3 months)  Receives Help From: Family  Prior Level of Assist for ADLs: Needs assistance  Toileting: Needs assistance  Prior Level of Assist for Homemaking: Needs assistance  Prior Level of Assist for Ambulation: Independent community ambulator, with or without device  Prior Level of Assist for Transfers: Needs assistance    Cognitive/Behavioral Status:  Orientation  Overall Orientation Status: Within Functional Limits  Orientation Level: Oriented X4 (with intermittent  confusion noted throughout session)  Cognition  Overall Cognitive Status: Exceptions  Arousal/Alertness: Delayed responses to stimuli  Following Commands: Follows one step commands with increased time;Follows one step commands with repetition  Attention Span: Appears intact  Memory: Impaired  Safety Judgement: Decreased awareness of need for safety  Problem Solving: Assistance required to implement solutions;Assistance required to identify errors made;Assistance required to generate solutions;Assistance required to correct errors made  Insights: Decreased awareness of deficits  Initiation: Requires cues for some  Sequencing: Requires cues for some    Skin: intact where visible    Edema: none noted     Hearing:   Hearing  Hearing: Within Functional Limits    Vision/Perceptual:          Vision  Vision: Within Functional Limits  Strength:    Strength: Generally decreased, functional    Range Of Motion:  AROM: Generally decreased, functional  PROM: Generally decreased, functional    Functional Mobility:  Bed Mobility:     Bed Mobility Training  Bed Mobility Training: Yes  Overall Level of Assistance: Partial/Moderate assistance  Interventions: Verbal cues;Safety awareness training  Rolling: Partial/Moderate assistance  Supine to Sit: Partial/Moderate assistance  Sit to Supine: Partial/Moderate assistance  Scooting: Partial/Moderate assistance  Transfers:     Art Therapist: Yes  Overall Level of Assistance: Substantial/Maximal assistance;2 Person assistance  Interventions: Safety awareness training;Verbal cues  Sit to Stand: Substantial/Maximal assistance;2 Person assistance  Stand to Sit: Substantial/Maximal assistance;2 Person assistance  Balance:      Balance  Sitting: Intact  Standing: Impaired  Standing - Static: Constant support;Fair  Standing - Dynamic: Constant support;Fair    Ambulation/Gait Training:      Soil Scientist: Yes  Overall Level of Assistance: Partial/Moderate  assistance;2 Person assistance  Distance (ft): 2 Feet (sidestepping towards HOB)  Assistive Device: Gait belt;Walker, rolling  Base of Support: Widened        Functional Measure:  [employer] AM-PAC 6 Clicks         Basic Mobility Inpatient Short Form  How much difficulty does the patient currently have... Unable A Lot A Little None   1.  Turning over in bed (including adjusting bedclothes, sheets and blankets)?   []  1   [x]  2   []  3   []  4   2.  Sitting down on and standing up from a chair with arms ( e.g., wheelchair, bedside commode, etc.)   []  1   [x]  2   []  3   []  4   3.  Moving from lying on back to sitting on the side of the bed?   []  1   [x]  2   []  3   []  4          How much help from another person does the patient currently need... Total A Lot A Little None   4.  Moving to and from a bed to a chair (including a wheelchair)?   []  1   [x]  2   []  3   []  4   5.  Need to walk in hospital room?   []  1   [x]  2   []  3   []  4   6.  Climbing 3-5 steps with a railing?   []  1   [x]  2   []  3   []  4    2007, Trustees of [address], under license to [place], [place]. All rights reserved     Score:  Initial: 12/24 Most Recent: (Date: 06/09/2024 )   Interpretation of Tool:  Represents activities that are increasingly more difficult (i.e. Bed mobility, Transfers, Gait).  Score 24 23 22-20 19-15 14-10 9-7 6   Modifier CH CI CJ CK CL CM CN         Physical Therapy Evaluation Charge Determination   History Examination Presentation Decision-Making   HIGH Complexity :3+ comorbidities / personal factors will impact the outcome/ POC  HIGH Complexity : 4+ Standardized tests and measures addressing body structure, function, activity limitation and / or participation in recreation  LOW Complexity : Stable, uncomplicated  Other outcome measures ampac 6  MEDIUM      Based on the above components, the patient evaluation is determined to be of the following complexity level: LOW  Therapeutic Intervention provided:   bed  mobility , EOB transfers, OOB transfers, amb with AD, LE therapeutic exercises, seated static and dynamic balance, and standing static and dynamic balance    Treatment Rendered:   Yes. Therapeutic Activities interventions rendered to provide treatment for loss of mobility, strength, and balance to improve functional mobility performance.     Pain Rating:  0/10 reported  Pain Intervention(s):   pain is at a level acceptable to the patient    Activity Tolerance:   Fair , requires frequent rest breaks, observed shortness of breath on exertion, desaturates with activity and requires oxygen, and SPO2 decreased to 87% on 2L    After treatment patient left in no apparent distress:   Bed locked and in lowest position Patient left in no apparent distress in bed, Call bell within reach, Caregiver / family present, Side rails x3, and OT in room to complete evaluation and nsg updated.    COMMUNICATION/EDUCATION:   The patients plan of care was discussed with: Occupational therapist, Registered nurse, and Case management     Patient Education  Education Given To: Patient;Family  Education Provided: Role of Therapy;Plan of Care;Home Exercise Program;Transfer Training;Energy Conservation;Equipment;Fall Prevention Strategies;Family Education  Education Provided Comments: DC recommendations, PT POC, LE therex, safety and importance of mobility including up to chair  Education Method: Demonstration;Verbal  Barriers to Learning: Cognition  Education Outcome: Verbalized understanding;Continued education needed    PT/OT sessions occurred together for increased safety of pt and clinician.     Thank you for this referral.  Shashank Kwasnik Taryn Jabarie Pop, PT, DPT  Minutes: 68        "

## 2024-06-09 NOTE — Plan of Care (Signed)
 "  Problem: Chronic Conditions and Co-morbidities  Goal: Patient's chronic conditions and co-morbidity symptoms are monitored and maintained or improved  Outcome: Progressing  Flowsheets (Taken 06/09/2024 2000)  Care Plan - Patient's Chronic Conditions and Co-Morbidity Symptoms are Monitored and Maintained or Improved: Monitor and assess patient's chronic conditions and comorbid symptoms for stability, deterioration, or improvement     Problem: Discharge Planning  Goal: Discharge to home or other facility with appropriate resources  Outcome: Progressing  Flowsheets (Taken 06/09/2024 2000)  Discharge to home or other facility with appropriate resources: Identify barriers to discharge with patient and caregiver     Problem: Neurosensory - Adult  Goal: Absence of seizures  Outcome: Progressing     Problem: Respiratory - Adult  Goal: Achieves optimal ventilation and oxygenation  06/09/2024 1550 by Thurman Norris, RN  Outcome: Progressing  Flowsheets (Taken 06/09/2024 0800)  Achieves optimal ventilation and oxygenation: Assess for changes in respiratory status     Problem: Skin/Tissue Integrity - Adult  Goal: Skin integrity remains intact  Description: 1.  Monitor for areas of redness and/or skin breakdown  2.  Assess vascular access sites hourly  3.  Every 4-6 hours minimum:  Change oxygen saturation probe site  4.  Every 4-6 hours:  If on nasal continuous positive airway pressure, respiratory therapy assess nares and determine need for appliance change or resting period  Outcome: Progressing  Flowsheets (Taken 06/09/2024 2000)  Skin Integrity Remains Intact: Monitor for areas of redness and/or skin breakdown  Goal: Oral mucous membranes remain intact  Outcome: Progressing  Flowsheets (Taken 06/09/2024 2000)  Oral Mucous Membranes Remain Intact: Assess oral mucosa and hygiene practices     Problem: Musculoskeletal - Adult  Goal: Return mobility to safest level of function  Recent Flowsheet Documentation  Taken  06/09/2024 2000 by Erika Calton Nat Dell, RN  Return Mobility to Safest Level of Function: Assess patient stability and activity tolerance for standing, transferring and ambulating with or without assistive devices  06/09/2024 1550 by Thurman Norris, RN  Outcome: Progressing  Flowsheets (Taken 06/09/2024 0800)  Return Mobility to Safest Level of Function: Assess patient stability and activity tolerance for standing, transferring and ambulating with or without assistive devices     Problem: Gastrointestinal - Adult  Goal: Minimal or absence of nausea and vomiting  Recent Flowsheet Documentation  Taken 06/09/2024 2000 by Erika Calton Nat Dell, RN  Minimal or absence of nausea and vomiting: Administer IV fluids as ordered to ensure adequate hydration  06/09/2024 1550 by Thurman Norris, RN  Outcome: Progressing  Flowsheets (Taken 06/09/2024 0800)  Minimal or absence of nausea and vomiting: Administer IV fluids as ordered to ensure adequate hydration     Problem: Genitourinary - Adult  Goal: Absence of urinary retention  Recent Flowsheet Documentation  Taken 06/09/2024 2000 by Erika Calton Nat Dell, RN  Absence of urinary retention: Assess patients ability to void and empty bladder  06/09/2024 1550 by Thurman Norris, RN  Outcome: Progressing  Flowsheets (Taken 06/09/2024 0800)  Absence of urinary retention: Assess patients ability to void and empty bladder     Problem: Safety - Adult  Goal: Free from fall injury  Outcome: Progressing     Problem: Pain  Goal: Verbalizes/displays adequate comfort level or baseline comfort level  Outcome: Progressing     Problem: Nutrition Deficit:  Goal: Optimize nutritional status  06/09/2024 1011 by Levonne Nest, RD  Outcome: Progressing  Flowsheets (Taken 06/09/2024 1011)  Nutrient intake appropriate for improving, restoring, or maintaining  nutritional needs:   Assess nutritional status and recommend course of action   Provide  specific nutrition education to patient or family as appropriate     Problem: Skin/Tissue Integrity  Goal: Skin integrity remains intact  Description: 1.  Monitor for areas of redness and/or skin breakdown  2.  Assess vascular access sites hourly  3.  Every 4-6 hours minimum:  Change oxygen saturation probe site  4.  Every 4-6 hours:  If on nasal continuous positive airway pressure, respiratory therapy assess nares and determine need for appliance change or resting period  Outcome: Progressing  Flowsheets (Taken 06/09/2024 2000)  Skin Integrity Remains Intact: Monitor for areas of redness and/or skin breakdown     Problem: SLP Adult - Impaired Swallowing  Goal: By Discharge: Advance to least restrictive diet without signs or symptoms of aspiration for planned discharge setting.  See evaluation for individualized goals.  Description: Speech Therapy Swallow Goals    Initiated 06/06/2024    -Patient stated goal: pt unable to state    -Patient will tolerate Clear liquid, mildly thick liquids, and meds crushed in applesauce diet without clinical indicators of aspiration given mod cues within 7 day(s).        -Patient will tolerate PO trials without clinical indicators of aspiration given mod cues within 7 day(s).      -Patient will participate in modified barium swallow study within 7 day(s), as indicated pending pt's progress and diet tolerance.      -Patient will demonstrate understanding of swallow safety precautions and aspiration precautions, diet recs with no cues within 7 day(s).           06/09/2024 1640 by Leonora Tinnie RAMAN, SLP  Outcome: Progressing     Problem: Occupational Therapy - Adult  Goal: By Discharge: Performs self-care activities at highest level of function for planned discharge setting.  See evaluation for individualized goals.  Description: FUNCTIONAL STATUS PRIOR TO ADMISSION:  Pt required assistance with ADLs and IADLs    HOME SUPPORT: The patient lived with daughter to provide  assistance.    Occupational Therapy Goals:  Initiated 06/09/2024  Patient/Family stated goal: Unstated  1.  Patient will perform grooming with Independence within 7 day(s).  2.  Patient will perform upper body dressing with Independence within 7 day(s).  3.  Patient will perform lower body dressing with Minimal Assist within 7 day(s).  4.  Patient will perform toilet transfers with Moderate Assist  within 7 day(s).  5.  Patient will perform all aspects of toileting with Moderate Assist within 7 day(s).  6.  Patient will participate in upper extremity therapeutic exercise/activities with Independence within 7 day(s).  06/09/2024 1551 by Taunya Cadet, OT  Outcome: Progressing  06/09/2024 1029 by Taunya Cadet, OT  Outcome: Progressing     Problem: Physical Therapy - Adult  Goal: By Discharge: Performs mobility at highest level of function for planned discharge setting.  See evaluation for individualized goals.  Description: FUNCTIONAL STATUS PRIOR TO ADMISSION: Pt required assistance with ADLs and IADLs    HOME SUPPORT: The patient lived with daughter to provide assistance but is not available to 24/7 for assistance    Physical Therapy Goals  Initiated 06/09/2024  Pt stated goal: to go home  Pt will be I with [facility] in 7 days.  Pt will perform bed mobility with CGA in 7 days.  Pt will perform transfers with CGA in 7 days.   Pt will amb 25-50 feet with LRAD safely with  Contact Guard Assist in 7 days.  Pt will demonstrate improvement in standing balance from Moderate Assist x2 to CGA in 7 days.      06/09/2024 1612 by Jamerson, Leah Taryn, PT  Outcome: Progressing     "

## 2024-06-09 NOTE — Consults (Signed)
 "IP WOUND CONSULT    Breanna Lester  MEDICAL RECORD NUMBER:  [id number]  AGE: 63 y.o.   GENDER: female  DOB: [birth year]  TODAY'S DATE:  06/09/2024    GENERAL     []  Follow-up   [x]  New Consult    Breanna Lester is a 63 y.o. female referred by:   [x]  Physician  [x]  Nursing  []  Other:         PAST MEDICAL HISTORY    Past Medical History:   Diagnosis Date    Anxiety     Depression     Gastrointestinal disorder     Heart failure (HCC)     Hyperlipidemia     Hypertension     Liver disease 2009    liver failure    Pneumonia         PAST SURGICAL HISTORY    Past Surgical History:   Procedure Laterality Date    ADENOIDECTOMY  1972    BACK SURGERY      cement discs following a fall    CATARACT REMOVAL      CESAREAN SECTION  1986 and 1993    DILATION AND CURETTAGE OF UTERUS  1999    EGD TRANSORAL BIOPSY SINGLE/MULTIPLE  05/17/2009         HYSTERECTOMY (CERVIX STATUS UNKNOWN)  2008    TONSILLECTOMY  1972    UPPER GASTROINTESTINAL ENDOSCOPY N/A 04/17/2024    ESOPHAGOGASTRODUODENOSCOPY performed by Madlyn Fendt, MD at [facility] END[facility]       FAMILY HISTORY    Family History   Problem Relation Age of Onset    Cancer Mother     Heart Disease Father          ALLERGIES    Allergies   Allergen Reactions    Latex Rash       MEDICATIONS    No current facility-administered medications on file prior to encounter.     Current Outpatient Medications on File Prior to Encounter   Medication Sig Dispense Refill    chlorthalidone  (HYGROTON ) 25 MG tablet Take 1 tablet by mouth daily      metFORMIN  (GLUCOPHAGE -XR) 500 MG extended release tablet Take 1 tablet by mouth daily (with breakfast) (Patient not taking: Reported on 06/04/2024)      albuterol  (PROVENTIL ) (2.5 MG/3ML) 0.083% nebulizer solution Take 1.5 mLs by nebulization every 2 hours as needed for Wheezing (Patient not taking: Reported on 06/04/2024) 120 each 3    apixaban  (ELIQUIS ) 5 MG TABS tablet Take 1 tablet by mouth 2 times daily (Patient not taking: Reported on 06/04/2024) 60 tablet 1     escitalopram  (LEXAPRO ) 5 MG tablet Take 1 tablet by mouth daily (Patient not taking: Reported on 06/04/2024) 30 tablet 3    sertraline  (ZOLOFT ) 100 MG tablet Take 1 tablet by mouth daily 30 tablet 3    guaiFENesin  (MUCINEX ) 600 MG extended release tablet Take 1 tablet by mouth 2 times daily (Patient not taking: Reported on 06/04/2024) 15 tablet 1    bumetanide  (BUMEX ) 1 MG tablet Take 1 tablet by mouth daily (Patient not taking: Reported on 06/04/2024) 30 tablet 3    pantoprazole  (PROTONIX ) 40 MG tablet Take 1 tablet by mouth 2 times daily (before meals) (Patient taking differently: Take 1 tablet by mouth daily) 30 tablet 3    Melatonin 10 MG TABS Take 1 tablet by mouth at bedtime      lactobacillus (CULTURELLE) capsule Take 1 capsule by mouth daily (with breakfast) 30 capsule  0    ondansetron  (ZOFRAN -ODT) 4 MG disintegrating tablet Take 1 tablet by mouth every 8 hours as needed for Nausea or Vomiting (Patient not taking: Reported on 06/04/2024) 30 tablet 0    metoprolol  tartrate (LOPRESSOR ) 25 MG tablet Take 0.5 tablets by mouth 2 times daily (Patient not taking: Reported on 06/04/2024) 30 tablet 1    albuterol  sulfate HFA (VENTOLIN  HFA) 108 (90 Base) MCG/ACT inhaler Inhale 2 puffs into the lungs 4 times daily as needed for Wheezing 18 g 0    Blood Glucose Monitoring Suppl (BLOOD GLUCOSE MONITOR SYSTEM) w/Device KIT Pharmacist to identify preferred meter and strips. 1 kit 0    blood glucose monitor strips Test 1-2 times a day & as needed for symptoms of irregular blood glucose. Dispense sufficient amount for indicated testing frequency plus additional to accommodate PRN testing needs. Pharmacist to identify preferred brand. 100 strip 2    Lancets 30G MISC Test 1-2 times a day & as needed for symptoms of irregular blood glucose. Dispense sufficient amount for indicated testing frequency plus additional to accommodate PRN testing needs. Pharmacist to identify preferred brand. 100 each 2    acetaminophen  (TYLENOL ) 325  MG tablet Take 1 tablet by mouth every 6 hours as needed for Pain 40 tablet 0          BP (!) 141/68   Pulse 58   Temp 97.5 F (36.4 C) (Oral)   Resp 22   Ht 1.575 m (5' 2)   Wt 115 kg (253 lb 8.5 oz)   SpO2 98%   BMI 46.36 kg/m   WBC =   On   Lab Results   Component Value Date/Time    WBC 6.5 06/09/2024 02:00 AM    LABA1C 8.2 (H) 04/24/2024 04:59 AM    POCGLU 127 (H) 06/09/2024 11:50 AM    POCGLU 127 (H) 06/09/2024 08:59 AM    HGB 10.1 (L) 06/09/2024 02:00 AM    HCT 34.7 (L) 06/09/2024 02:00 AM    PLT 132 (L) 06/09/2024 02:00 AM     Wound culture obtained on N/A with results   Diet:   ADULT DIET; Regular; 3 carb choices (45 gm/meal)         ASSESSMENT     Wound Identification & Type:   Dressing change:  Verbal consent for picture:    Contributing Factors: chronic pressure, decreased mobility, and shear force    Wound 06/06/24 Other (Comment) Other (Comment) Left (Active)   Wound Etiology Skin Tear 06/08/24 0730   Wound Dressing Status Clean;Dry;Intact 06/09/24 0800   Wound Cleansed Cleansed with saline 06/09/24 0800   Wound Dressing/Treatment Petroleum impregnated gauze;Non adherent;Foam 06/07/24 0800   Wound Assessment Erythema 06/09/24 0800   Number of days: 3       Wound 06/09/24  Arm Left;Lower (Active)   Wound Image   06/09/24 1550   Wound Type Wound 06/09/24 1550   Wound Dressing Status New dressing applied 06/09/24 1550   Wound Cleansed Cleansed with saline 06/09/24 1550   Wound Dressing/Treatment Xeroform;Foam 06/09/24 1550   Wound Length (cm) 0.4 cm 06/09/24 1550   Wound Width (cm) 0.4 cm 06/09/24 1550   Wound Depth (cm) 0.1 cm 06/09/24 1550   Wound Surface Area (cm^2) 0.13 cm^2 06/09/24 1550   Wound Volume (cm^3) 0.008 cm^3 06/09/24 1550   Wound Assessment Pink/red 06/09/24 1550   Wound Drainage Amount None (dry) 06/09/24 1550   Wound Odor None 06/09/24 1550   Wound Margins Attached edges  06/09/24 1550   Number of days: 0        Assessment: wound    -A&O x:   -Verbally communicative  (Y/N):Y  -Mobile (Y/N): Y      -Assists in repositioning (Y/N):Y    -Able to turn independently (Y/N):  N    PLAN     Skin Care & Pressure Relief Recommendations  Minimize Layers of Linen, Turn/reposition approximately every 2 hours, Pillow / Wedges, Manage incontinence, Promote continence; Skin protective lotion/cream to buttocks and sacrum daily and as needed with incontinence care, Offloading boots, and Floating heels    Physician/Provider notified: Y  Recommendations:   Skin tear to Left lower arm: Clean with NS, pat dry, apply Xeroform, cover with foam dressing. Daily and as needed.   Discharge Wound Care Needs:    Teaching completed with:   [x]  Patient           []  Family member       []  Caregiver          []  Nursing  []  Other    Patient/Caregiver Teaching:  Level of patient/caregiver understanding able to:   [x]  Indicates understanding       []  Needs reinforcement  []  Unsuccessful      [x]  Verbal Understanding  []  Demonstrated understanding       []  No evidence of learning  []  Refused teaching         []  N/A         Wound care and assessment was done during this visit, Patient is alert and oriented and tolerated well. Patient was educated regarding frequent position change, wound and skin care and pressure relief,  verbalized understanding.        Electronically signed by Marlee Brady, RN on 06/09/2024 at 3:52 PM          "

## 2024-06-09 NOTE — Consults (Signed)
 "    Hospitalist Admission Note    NAME: Breanna Lester   DOB:  18-May-1961   MRN:  [id number]     Date/Time:  06/09/2024 12:30 PM    Patient PCP: Unknown, Provider    ______________________________________________________________________  Given the patient's current clinical presentation in regards to the patient's multiple medical problems, complex decision making was performed, which includes reviewing the patient's available past medical records, laboratory results, and diagnostic studies.       My assessment of this patient's clinical condition and my plan of care is as follows.    Assessment / Plan:  Severe bilateral CAP  COPD  Hypercapnia  Acute on chronic hypoxic respiratory failure 2/2 above  Initially requiring mechanical ventilation, now on home dose 3L NC  Wean O2 as tolerated to maintain sats >92%  Bicarb increased to 34  Continue empiric ceftriaxone , completed azithromycin   SLP evaluation for possible aspiration  Overnight pulse oximetry   Still appears to have increased work of breathing, will recheck ABG  Pulm consult     Acute metabolic encephalopathy  Polysubstance abuse  Likely related to acute infection and polysubstance abuse  UDS positive for amphetamines  Ammonia WNL  CTH, CTA head/neck, CT brain perfusion negative for CVA  Check TSH/T4  Neurology evaluated, recommended EEG if clinical status worsening  Psychiatry following, continue Zoloft  and Zyprexa     A-fib  Currently rate controlled  Continue Eliquis  and metoprolol     Thrombocytopenia, mild  Platelets mildly decreased likely secondary to cirrhosis  Monitor daily CBC and for any bleeding    OSA  Continue nightly CPAP        Medical Decision Making     [x]  High (any 2)     A. Problems (any 1)  [x]  Acute/Chronic Illness/injury posing threat to life or bodily function:    []  Severe exacerbation of chronic illness:    ---------------------------------------------------------------------  B. Risk of Treatment (any 1)   [x]  Drugs/treatments that  require intensive monitoring for toxicity include:    [x]  IV ABX requiring serial renal monitoring for nephrotoxicity:     []  IV Narcotic analgesia for adverse drug reaction  []  Aggressive IV diuresis requiring serial monitoring for renal impairment and electrolyte derangements  []  Critical electrolyte abnormalities requiring IV replacement and close serial monitoring  []  Insulin  - monitoring serial FSBS for Hypoglycemic adverse drug reaction  []  Other -   []  Change in code status:    []  Decision to escalate care:    []  Major surgery/procedure with associated risk factors:     ----------------------------------------------------------------------  C. Data (any 2)  []  Discussed current management and discharge planning options with Case Management.  [x]  Discussed management of the case with:  Patient, RN  []  Telemetry personally reviewed and interpreted as documented above    []  Imaging personally reviewed and interpreted, includes:  CXR   [x]  Data Review (any 3)  [x]  All available Consultant notes from yesterday/today were reviewed  [x]  All current labs were reviewed and interpreted for clinical significance   [x]  Appropriate follow-up labs were ordered  []  Collateral history obtained from:       Code Status: Full  DVT Prophylaxis: Eliquis   GI Prophylaxis: Not indicated      Subjective:   CHIEF COMPLAINT: Dyspnea    REASON FOR CONSULT: Transfer out of ICU    HISTORY OF PRESENT ILLNESS:     Breanna Lester is a 63 y.o.  female with PMHx significant for chronic hypoxic respiratory  failure on 3 L nasal cannula secondary to COPD, anxiety, depression, HTN, A-fib on Eliquis , T2DM, pancreatitis and EtOH cirrhosis who presented to ED on 06/03/2024 for worsening dyspnea.  In ED, patient severely hypoxic with increased work of breathing which was refractory to BiPAP, requiring intubation.  CTA chest shows extensive bilateral pulmonary infiltrates concerning for severe bilateral pneumonia, however negative for PE.  Started on  empiric IV ceftriaxone  and azithromycin .  Respiratory status improved and patient was able to be extubated to BiPAP and further decreased to home dose 3 L NC.  However, patient remained confused so psychiatry was consulted.  Started on Zoloft  and Zyprexa .  Stroke alert was called on 10/17 for left-sided weakness with complete left hemianopia.  CT head, CTA head/neck and CT brain perfusion with no acute findings.  Patient unable to tolerate MRI.  Neurology evaluated, consider EEG if clinical status worsening otherwise no further stroke work up.  Transferred to medical service on 10/20.    Past Medical History:   Diagnosis Date    Anxiety     Depression     Gastrointestinal disorder     Heart failure (HCC)     Hyperlipidemia     Hypertension     Liver disease 2009    liver failure    Pneumonia         Past Surgical History:   Procedure Laterality Date    ADENOIDECTOMY  1972    BACK SURGERY      cement discs following a fall    CATARACT REMOVAL      CESAREAN SECTION  1986 and 1993    DILATION AND CURETTAGE OF UTERUS  1999    EGD TRANSORAL BIOPSY SINGLE/MULTIPLE  05/17/2009         HYSTERECTOMY (CERVIX STATUS UNKNOWN)  2008    TONSILLECTOMY  1972    UPPER GASTROINTESTINAL ENDOSCOPY N/A 04/17/2024    ESOPHAGOGASTRODUODENOSCOPY performed by Madlyn Fendt, MD at [facility] ENDOSCOPY       Social History     Tobacco Use    Smoking status: Former     Current packs/day: 0.00     Types: Cigarettes     Quit date: 02/28/2008     Years since quitting: 16.2    Smokeless tobacco: Never   Substance Use Topics    Alcohol use: Never        Family History   Problem Relation Age of Onset    Cancer Mother     Heart Disease Father      Allergies   Allergen Reactions    Latex Rash        Prior to Admission medications   Medication Sig Start Date End Date Taking? Authorizing Provider   chlorthalidone  (HYGROTON ) 25 MG tablet Take 1 tablet by mouth daily   Yes [provider]   metFORMIN  (GLUCOPHAGE -XR) 500 MG extended release tablet Take 1  tablet by mouth daily (with breakfast)  Patient not taking: Reported on 06/04/2024    [provider]   albuterol  (PROVENTIL ) (2.5 MG/3ML) 0.083% nebulizer solution Take 1.5 mLs by nebulization every 2 hours as needed for Wheezing  Patient not taking: Reported on 06/04/2024 05/09/24   Karenann Barter, MD   apixaban  (ELIQUIS ) 5 MG TABS tablet Take 1 tablet by mouth 2 times daily  Patient not taking: Reported on 06/04/2024 05/09/24   Karenann Barter, MD   escitalopram  (LEXAPRO ) 5 MG tablet Take 1 tablet by mouth daily  Patient not taking: Reported on 06/04/2024 05/10/24  Karenann Barter, MD   sertraline  (ZOLOFT ) 100 MG tablet Take 1 tablet by mouth daily 05/10/24   Karenann Barter, MD   guaiFENesin  (MUCINEX ) 600 MG extended release tablet Take 1 tablet by mouth 2 times daily  Patient not taking: Reported on 06/04/2024 05/09/24   Karenann Barter, MD   bumetanide  (BUMEX ) 1 MG tablet Take 1 tablet by mouth daily  Patient not taking: Reported on 06/04/2024 05/10/24   Karenann Barter, MD   pantoprazole  (PROTONIX ) 40 MG tablet Take 1 tablet by mouth 2 times daily (before meals)  Patient taking differently: Take 1 tablet by mouth daily 05/09/24   Karenann Barter, MD   Melatonin 10 MG TABS Take 1 tablet by mouth at bedtime    [provider]   lactobacillus (CULTURELLE) capsule Take 1 capsule by mouth daily (with breakfast) 04/26/24   Bowlin, Amber L, APRN - CNP   ondansetron  (ZOFRAN -ODT) 4 MG disintegrating tablet Take 1 tablet by mouth every 8 hours as needed for Nausea or Vomiting  Patient not taking: Reported on 06/04/2024 04/17/24   Helmich, Jamie L, MD   metoprolol  tartrate (LOPRESSOR ) 25 MG tablet Take 0.5 tablets by mouth 2 times daily  Patient not taking: Reported on 06/04/2024 04/11/24 06/10/24  Bertell Bathe, MD   albuterol  sulfate HFA (VENTOLIN  HFA) 108 (90 Base) MCG/ACT inhaler Inhale 2 puffs into the lungs 4 times daily as needed for Wheezing 04/11/24 06/10/24  Bertell Bathe, MD   Blood Glucose Monitoring Suppl (BLOOD  GLUCOSE MONITOR SYSTEM) w/Device KIT Pharmacist to identify preferred meter and strips. 04/11/24   Smithson, Olivia LABOR, APRN - CNS   blood glucose monitor strips Test 1-2 times a day & as needed for symptoms of irregular blood glucose. Dispense sufficient amount for indicated testing frequency plus additional to accommodate PRN testing needs. Pharmacist to identify preferred brand. 04/11/24   Smithson, Olivia LABOR, APRN - CNS   Lancets 30G MISC Test 1-2 times a day & as needed for symptoms of irregular blood glucose. Dispense sufficient amount for indicated testing frequency plus additional to accommodate PRN testing needs. Pharmacist to identify preferred brand. 04/11/24   Smithson, Olivia LABOR, APRN - CNS   acetaminophen  (TYLENOL ) 325 MG tablet Take 1 tablet by mouth every 6 hours as needed for Pain 02/26/24 03/07/24  Cutchins, Morene DASEN, PA-C         Objective:   VITALS:    Patient Vitals for the past 24 hrs:   BP Temp Temp src Pulse Resp SpO2 Weight   06/09/24 0909 -- -- -- -- -- 100 % --   06/09/24 0907 -- 97.5 F (36.4 C) Oral -- -- -- --   06/09/24 0700 (!) 149/77 -- -- 71 (!) 35 -- --   06/09/24 0656 (!) 152/57 -- -- 63 23 -- --   06/09/24 0630 (!) 156/137 -- -- 69 24 -- --   06/09/24 0600 (!) 140/69 -- -- 64 17 -- --   06/09/24 0530 138/64 -- -- 63 21 -- --   06/09/24 0516 -- -- -- -- -- -- 115 kg (253 lb 8.5 oz)   06/09/24 0500 (!) 172/85 -- -- 72 28 -- --   06/09/24 0430 132/65 -- -- 63 21 -- --   06/09/24 0415 -- -- -- 65 22 96 % --   06/09/24 0400 (!) 143/90 -- -- 76 13 -- --   06/09/24 0330 (!) 141/50 -- -- 64 19 -- --   06/09/24 0300 (!) 117/46 98.6 F (37  C) Oral 70 22 94 % --   06/09/24 0230 (!) 108/33 -- -- 66 20 97 % --   06/09/24 0213 -- -- -- 70 26 98 % --   06/09/24 0200 (!) 133/53 -- -- 67 16 100 % --   06/09/24 0130 (!) 119/57 -- -- 61 27 (!) 75 % --   06/09/24 0112 -- -- -- 82 21 94 % --   06/09/24 0100 -- -- -- -- -- 97 % --   06/09/24 0051 -- -- -- 64 23 94 % --   06/09/24 0030 -- -- -- -- -- 95  % --   06/09/24 0000 -- -- -- -- -- 94 % --   06/08/24 2330 (!) 117/44 -- -- -- 24 95 % --   06/08/24 2300 (!) 110/52 98.4 F (36.9 C) Oral 64 22 95 % --   06/08/24 2230 (!) 150/78 -- -- 73 26 98 % --   06/08/24 2200 (!) 121/49 -- -- 71 27 96 % --   06/08/24 2130 (!) 127/54 -- -- 68 21 96 % --   06/08/24 2100 (!) 129/55 -- -- 82 24 95 % --   06/08/24 2035 (!) 153/65 -- -- 80 24 96 % --   06/08/24 2030 (!) 153/65 -- -- 82 24 99 % --   06/08/24 2021 -- -- -- 82 25 96 % --   06/08/24 2000 134/64 -- -- 80 25 97 % --   06/08/24 1939 (!) 166/63 98.3 F (36.8 C) Oral 84 22 97 % --   06/08/24 1930 (!) 156/66 -- -- 79 21 98 % --   06/08/24 1900 139/70 -- -- 80 18 97 % --   06/08/24 1830 136/70 -- -- 73 24 96 % --   06/08/24 1800 (!) 120/46 -- -- 73 21 94 % --   06/08/24 1730 (!) 150/99 -- -- 80 21 96 % --   06/08/24 1714 -- -- -- 76 22 97 % --   06/08/24 1700 (!) 126/56 -- -- 72 22 96 % --   06/08/24 1630 (!) 119/48 -- -- 77 22 96 % --   06/08/24 1600 (!) 133/47 -- -- 73 22 97 % --   06/08/24 1536 -- -- -- 71 24 96 % --   06/08/24 1530 (!) 132/58 -- -- 68 24 96 % --   06/08/24 1500 125/70 98.1 F (36.7 C) Oral 78 (!) 46 96 % --   06/08/24 1430 132/64 -- -- 75 28 97 % --   06/08/24 1400 (!) 119/46 -- -- 81 (!) 31 90 % --   06/08/24 1330 (!) 124/50 -- -- 68 24 96 % --   06/08/24 1300 (!) 121/36 -- -- 69 21 95 % --       Temp (24hrs), Avg:98.2 F (36.8 C), Min:97.5 F (36.4 C), Max:98.6 F (37 C)           Wt Readings from Last 12 Encounters:   06/09/24 115 kg (253 lb 8.5 oz)   06/06/24 126.6 kg (279 lb 1.6 oz)   05/03/24 129.7 kg (286 lb)   05/05/24 129.7 kg (286 lb)   04/25/24 125 kg (275 lb 9.6 oz)   04/17/24 122.5 kg (270 lb)   04/08/24 124.3 kg (274 lb)   02/26/24 113.4 kg (250 lb)   02/18/24 117.9 kg (260 lb)   03/19/22 113.4 kg (250 lb)       Review of Systems   Unable to perform  ROS: Mental status change        PHYSICAL EXAM:  Physical Exam  Vitals and nursing note reviewed.   Constitutional:       General: She is  sleeping.      Appearance: She is ill-appearing.      Comments: Patient somewhat lethargic, able to arouse and answering questions appropriately although quickly falls back asleep   Cardiovascular:      Rate and Rhythm: Normal rate and regular rhythm.      Heart sounds: Normal heart sounds.   Pulmonary:      Breath sounds: No stridor. No wheezing.      Comments: Mildly coarse breath sounds bilaterally, still appears to have although saturating well on 3 L nasal cannula which is home dose  Abdominal:      General: There is no distension.      Palpations: Abdomen is soft.      Tenderness: There is no abdominal tenderness.      Hernia: A hernia (Umbilical hernia) is present.   Musculoskeletal:      Right lower leg: No edema.      Left lower leg: No edema.   Skin:     General: Skin is warm and dry.   Neurological:      Comments: Patient uncooperative, unable to fully assess neurologic status               LAB DATA REVIEWED:    Recent Results (from the past 12 hours)   CBC    Collection Time: 06/09/24  2:00 AM   Result Value Ref Range    WBC 6.5 3.6 - 11.0 K/uL    RBC 4.01 3.80 - 5.20 M/uL    Hemoglobin 10.1 (L) 11.5 - 16.0 g/dL    Hematocrit 65.2 (L) 35.0 - 47.0 %    MCV 86.5 80.0 - 99.0 FL    MCH 25.2 (L) 26.0 - 34.0 PG    MCHC 29.1 (L) 30.0 - 36.5 g/dL    RDW 82.3 (H) 88.4 - 14.5 %    Platelets 132 (L) 150 - 400 K/uL    MPV 11.4 8.9 - 12.9 FL    Nucleated RBCs 0.5 (H) 0.0 PER 100 WBC    nRBC 0.03 (H) 0.00 - 0.01 K/uL   Comprehensive Metabolic Panel    Collection Time: 06/09/24  2:00 AM   Result Value Ref Range    Sodium 142 136 - 145 mmol/L    Potassium 3.8 3.5 - 5.1 mmol/L    Chloride 102 98 - 107 mmol/L    CO2 34 (H) 20 - 29 mmol/L    Anion Gap 7 2 - 14 mmol/L    Glucose 131 (H) 65 - 100 mg/dL    BUN 20 8 - 23 mg/dL    Creatinine 9.33 9.39 - 1.00 mg/dL    BUN/Creatinine Ratio 31 (H) 12 - 20      Est, Glom Filt Rate >90 >59 ml/min/1.53m2    Calcium  8.3 (L) 8.8 - 10.2 mg/dL    Total Bilirubin 1.0 0.0 - 1.2 mg/dL     AST 33 10 - 35 U/L    ALT 21 10 - 35 U/L    Alk Phosphatase 84 35 - 104 U/L    Total Protein 5.6 (L) 6.4 - 8.3 g/dL    Albumin 2.3 (L) 3.5 - 5.2 g/dL    Globulin 3.3 2.0 - 4.0 g/dL    Albumin/Globulin Ratio 0.7 (L) 1.1 - 2.2  Magnesium     Collection Time: 06/09/24  2:00 AM   Result Value Ref Range    Magnesium  2.0 1.6 - 2.4 mg/dL   Phosphorus    Collection Time: 06/09/24  2:00 AM   Result Value Ref Range    Phosphorus 2.6 2.5 - 4.5 mg/dL   POCT Glucose    Collection Time: 06/09/24  4:17 AM   Result Value Ref Range    POC Glucose 106 (H) 65 - 100 mg/dL    Performed by: Tilman Millman    POCT Glucose    Collection Time: 06/09/24  8:59 AM   Result Value Ref Range    POC Glucose 127 (H) 65 - 100 mg/dL    Performed by: Elyse Been    POCT Glucose    Collection Time: 06/09/24 11:50 AM   Result Value Ref Range    POC Glucose 127 (H) 65 - 100 mg/dL    Performed by: Elyse Been          Imaging Results (most recent):  No results found.    _______________________________________________________________________    TOTAL TIME:  70 Minutes    Critical Care Provided     Minutes non procedure based    Signed: Lum DELENA Neth, PA-C    Procedures: see electronic medical records for all procedures/Xrays and details which were not copied into this note but were reviewed prior to creation of Plan.   "

## 2024-06-09 NOTE — Care Coordination (Addendum)
"  Pt lvies with her daughter at [facility] [address] [place].    Pt was IND with ADL.    Will need PT/OT eval/recommendations.     Per IDR    PT/OT ordered    Pt will go to the floor today.         "

## 2024-06-09 NOTE — Plan of Care (Signed)
 "OCCUPATIONAL THERAPY EVALUATION  Patient: Breanna Lester (63 y.o. female)  Date: 06/09/2024  Primary Diagnosis: Acute respiratory failure, unspecified whether with hypoxia or hypercapnia (HCC) [J96.00]  Acute on chronic congestive heart failure, unspecified heart failure type (HCC) [I50.9]  Acute hypoxic respiratory failure (HCC) [J96.01]       Precautions: General Precautions, Fall Risk, Bed Alarm                Recommendations for nursing mobility: Encourage HEP in prep for ADLs/mobility; see handout for details, Frequent repositioning to prevent skin breakdown, Use of bed/chair alarm for safety, Use of BSC for toileting , and Assist x2    In place during session:Nasal Cannula 3L, External Catheter, EKG/telemetry , and Pulse ox  ASSESSMENT  Pt is a 63 y.o. female admitted 06/03/2024  and currently being treated for acute on chronic hypoxemic respiratory failure, severe bilateral pneumonia, Afib, and liver cirrhosis - alcohol induced. Pt received semi-supine in bed upon arrival, AXO x3, disoriented to situation, and agreeable to OT evaluation.     Based on current observations, pt presents with decreased  functional mobility, independence in ADLs, high-level IADLs, ROM, strength, body mechanics, activity tolerance, endurance, safety awareness, coordination, balance, posture (see below for objective details and assist levels).     Overall, pt tolerates session fair with 5/10 reported pain in back, generalized weakness. O2 levels fluctuated between 97% and 91% with movement throughout the session. Pt required modA and verbal cues to transition from supine to seated EOB. While seated EOB, pt reported feeling very dizzy and required 2-3 minutes to rest. Pt then washed her face with setupA for item retrieval. Pt attempted sit to stand transfer twice with maxA and use of gait belt and RW but pt was not able to stand. Pt was encouraged to attempt a third sit to stand attempt but pt declined and requested to return to  supine. Pt required modA to transition back to supine in bed and complete scooting towards HOB. Pt will benefit from continued skilled OT services to address current impairments and improve IND and safety with self cares and functional transfers/mobility. Current OT d/c recommendation Moderate intensity short-term skilled occupational therapy up to 5x/week once medically appropriate.     Start of Session End of Session   SPO2 (%) 93 95   Heart Rate (BPM) 64 67     GOALS:    Problem: Occupational Therapy - Adult  Goal: By Discharge: Performs self-care activities at highest level of function for planned discharge setting.  See evaluation for individualized goals.  Description: FUNCTIONAL STATUS PRIOR TO ADMISSION:  Pt required assistance with ADLs and IADLs    HOME SUPPORT: The patient lived with daughter to provide assistance.    Occupational Therapy Goals:  Initiated 06/09/2024  Patient/Family stated goal: Unstated  1.  Patient will perform grooming with Independence within 7 day(s).  2.  Patient will perform upper body dressing with Independence within 7 day(s).  3.  Patient will perform lower body dressing with Minimal Assist within 7 day(s).  4.  Patient will perform toilet transfers with Moderate Assist  within 7 day(s).  5.  Patient will perform all aspects of toileting with Moderate Assist within 7 day(s).  6.  Patient will participate in upper extremity therapeutic exercise/activities with Independence within 7 day(s).  Outcome: Progressing        PLAN :  Recommendations and Planned Interventions: self care training, therapeutic activities, functional mobility training, balance training, therapeutic exercise, endurance activities,  patient education, and home safety training    Recommend next OT session: LB dressing and standing grooming    Frequency/Duration: Patient will be followed by occupational therapy:  3-5x/week to address goals.    Recommendation for discharge: (in order for the patient to meet  his/her long term goals)  Moderate intensity short-term skilled occupational therapy up to 5x/week    Potential barriers for safe discharge: pt has poor safety awareness, pt has impaired cognition, pt is a high fall risk, pt is not safe to be alone, and concern for pt safely navigating or managing the home environment.     IF patient discharges home will need the following DME: continuing to assess with progress     SUBJECTIVE:   Patient stated My back hurts.    OBJECTIVE DATA SUMMARY:     Past Medical History:   Diagnosis Date    Anxiety     Depression     Gastrointestinal disorder     Heart failure (HCC)     Hyperlipidemia     Hypertension     Liver disease 2009    liver failure    Pneumonia      Past Surgical History:   Procedure Laterality Date    ADENOIDECTOMY  1972    BACK SURGERY      cement discs following a fall    CATARACT REMOVAL      CESAREAN SECTION  1986 and 1993    DILATION AND CURETTAGE OF UTERUS  1999    EGD TRANSORAL BIOPSY SINGLE/MULTIPLE  05/17/2009         HYSTERECTOMY (CERVIX STATUS UNKNOWN)  2008    TONSILLECTOMY  1972    UPPER GASTROINTESTINAL ENDOSCOPY N/A 04/17/2024    ESOPHAGOGASTRODUODENOSCOPY performed by Madlyn Fendt, MD at [facility] ENDOSCOPY        Expanded or extensive additional review of patient history:   Lives With: Daughter  Type of Home:  [facility])  Home Layout: One level  Home Access: Level entry        Bathroom Shower/Tub: Tub/Shower unit           Home Equipment: Oxygen     Social/Functional History  Lives With: Daughter  Type of Home:  (Hotel)  Home Layout: One level  Home Access: Level entry  Bathroom Shower/Tub: Tub/Shower unit  Home Equipment: Oxygen  Prior Level of Assist for ADLs: Needs assistance  Toileting: Needs assistance  Prior Level of Assist for Homemaking: Needs assistance  Prior Level of Assist for Transfers: Needs assistance    Hand Dominance: unknown     EXAMINATION OF PERFORMANCE DEFICITS:    Cognitive/Behavioral Status:  Orientation  Overall Orientation Status:  Impaired  Orientation Level: Oriented to place;Oriented to time;Oriented to person;Disoriented to situation  Cognition  Overall Cognitive Status: Exceptions  Arousal/Alertness: Delayed responses to stimuli  Following Commands: Follows one step commands with increased time;Follows one step commands with repetition  Attention Span: Appears intact  Memory: Impaired  Safety Judgement: Decreased awareness of need for safety  Problem Solving: Assistance required to implement solutions;Assistance required to identify errors made;Assistance required to generate solutions;Assistance required to correct errors made  Insights: Decreased awareness of deficits  Initiation: Requires cues for some  Sequencing: Requires cues for some    Skin: Intact where exposed    Edema: none noted    Hearing:   Hearing  Hearing: Within functional limits  Hearing: Within functional limits    Vision/Perceptual:  Vision  Vision: Impaired   Vision: Impaired    Range of Motion:   AROM: Generally decreased, functional       Strength:  Strength: Generally decreased, functional    Coordination:  Coordination: Generally decreased, functional            Tone & Sensation:   Tone: Normal  Sensation: Intact      Functional Mobility and Transfers for ADLs:  Bed Mobility:  Bed Mobility Training  Bed Mobility Training: Yes  Overall Level of Assistance: Partial/Moderate assistance  Interventions: Verbal cues;Safety awareness training  Rolling: Partial/Moderate assistance  Supine to Sit: Partial/Moderate assistance  Sit to Supine: Partial/Moderate assistance  Scooting: Partial/Moderate assistance    Transfers:  Art Therapist: Yes  Overall Level of Assistance: Substantial/Maximal assistance  Interventions: Safety awareness training;Verbal cues;Tactile cues  Sit to Stand: Substantial/Maximal assistance  Stand to Sit: Substantial/Maximal assistance      Balance:  Balance  Sitting: Intact      ADL Assessment:     Grooming:  Setup  Grooming Skilled Clinical Factors: To wash face while seated EOB    Therapeutic Intervention provided:   seated grooming and functional mobility/transfers in preparation for OOB ADLs    Treatment Rendered:   Yes. Therapeutic Activities interventions rendered to provide treatment for loss of mobility, strength, balance, and coordination to improve functional mobility performance.     Functional Measure:  [employer] AM-PACTM 6 Clicks                                                       Daily Activity Inpatient Short Form  How much help from another person does the patient currently need... Total; A Lot A Little None   1.  Putting on and taking off regular lower body clothing? []   1 [x]   2 []   3 []   4   2.  Bathing (including washing, rinsing, drying)? []   1 [x]   2 []   3 []   4   3.  Toileting, which includes using toilet, bedpan or urinal? []  1 [x]   2 []   3 []   4   4.  Putting on and taking off regular upper body clothing? []   1 [x]   2 []   3 []   4   5.  Taking care of personal grooming such as brushing teeth? []   1 [x]   2 []   3 []   4   6.  Eating meals? []   1 [x]   2 []   3 []   4    2007, Trustees of [address], under license to [place], [place]. All rights reserved     Score: 12/24     Interpretation of Tool:  Represents clinically-significant functional categories (i.e. Activities of daily living).  Percentage of Impairment CH    0%   CI    1-19% CJ    20-39% CK    40-59% CL    60-79% CM    80-99% CN     100%   AMPAC  Score 6-24 24 23  20-22 15-19 10-14 7-9 6     Occupational Therapy Evaluation Charge Determination   History Examination Decision-Making   LOW Complexity : Brief history review  LOW Complexity: 1-3 Performance deficits relating to physical, cognitive, or psychosocial skills that result in activity limitations and/or participation restrictions  LOW Complexity: No comorbidities that affect functional and  no verbal  or physical assist needed to complete eval tasks      Based on the above  components, the patient evaluation is determined to be of the following complexity level: Low    Pain Rating:  5/10 in back  Pain Intervention(s):   rest and repositioning    Activity Tolerance:   Fair     After treatment patient left in no apparent distress:    Patient left in no apparent distress in bed, Call bell within reach, and Side rails x3, bed locked and in lowest position    COMMUNICATION/EDUCATION:   The patients plan of care was discussed with: Registered nurse    Patient Education  Education Given To: Patient  Education Provided: Role of Therapy;Plan of Care;Orientation;Equipment;Mobility Training;Fall Prevention Strategies  Education Method: Verbal  Barriers to Learning: Cognition  Education Outcome: Demonstrated understanding;Continued education needed    The supervising occupational therapist and treating occupational therapist assistant have met to review this patients progress and plan of care.    This patients plan of care is appropriate for delegation to OTA.     Thank you for this referral,  Jacob Montour, OTD, OTR/L  Minutes: 27     "

## 2024-06-09 NOTE — Plan of Care (Signed)
 "OCCUPATIONAL THERAPY TREATMENT  Patient: Breanna Lester (63 y.o. female)  Date: 06/09/2024  Primary Diagnosis: Acute respiratory failure, unspecified whether with hypoxia or hypercapnia (HCC) [J96.00]  Acute on chronic congestive heart failure, unspecified heart failure type (HCC) [I50.9]  Acute hypoxic respiratory failure (HCC) [J96.01]       Precautions: General Precautions, Fall Risk, Bed Alarm                Recommendations for nursing mobility: Out of bed to chair for meals, Encourage HEP in prep for ADLs/mobility; see handout for details, Frequent repositioning to prevent skin breakdown, Use of bed/chair alarm for safety, and Assist x2    In place during session: Nasal Cannula 2L, External Catheter, EKG/telemetry , and Pulse ox  Chart, occupational therapy assessment, plan of care, and goals were reviewed.  ASSESSMENT  Patient continues with skilled OT services and is progressing towards goals. Pt semi-supine upon OT arrival, agreeable to session. Pt A&O x 4. (See below for objective details and assist levels). Pt's daughter present during session with pt permission.    Overall pt tolerated session fair today with 7/10 reported pain in her back confusion, generalized weakness and poor safety awareness. Pt completed bed mobility and transitioned from supine to seated EOB with modA. While seated EOB, pt denied any dizziness ad demonstrated good sitting balance and postural control. Pt then completed sit to stand transfer with maxA x2 and use of gait belt and RW. Pt took some side steps towards [facility] and completed stand to sit transfer with maxA x2. Pt transitioned back to supine in bed with modA and completed scooting towards HOB with modA x2. Bed was placed in chair position and pt brushed her teeth with setupA for item retrieval. Current OT recommendations for discharge Moderate intensity short-term skilled occupational therapy up to 5x/week. Will continue to benefit from skilled OT services, and will continue  to progress as tolerated.      Start of Session End of Session   SPO2 (%) 93 95   Heart Rate (BPM) 65 75     GOALS:    Problem: Occupational Therapy - Adult  Goal: By Discharge: Performs self-care activities at highest level of function for planned discharge setting.  See evaluation for individualized goals.  Description: FUNCTIONAL STATUS PRIOR TO ADMISSION:  Pt required assistance with ADLs and IADLs    HOME SUPPORT: The patient lived with daughter to provide assistance.    Occupational Therapy Goals:  Initiated 06/09/2024  Patient/Family stated goal: Unstated  1.  Patient will perform grooming with Independence within 7 day(s).  2.  Patient will perform upper body dressing with Independence within 7 day(s).  3.  Patient will perform lower body dressing with Minimal Assist within 7 day(s).  4.  Patient will perform toilet transfers with Moderate Assist  within 7 day(s).  5.  Patient will perform all aspects of toileting with Moderate Assist within 7 day(s).  6.  Patient will participate in upper extremity therapeutic exercise/activities with Independence within 7 day(s).  06/09/2024 1551 by Taunya Cadet, OT  Outcome: Progressing  06/09/2024 1029 by Taunya Cadet, OT  Outcome: Progressing        PLAN :  Patient continues to benefit from skilled intervention to address functional impairments. Continue treatment per established plan of care to address goals.    Recommend next OT session: LB dressing and standing grooming    Recommendation for discharge: (in order for the patient to meet his/her long term goals): Moderate  intensity short-term skilled occupational therapy up to 5x/week    Potential barriers for safe discharge: pt has poor safety awareness, pt has impaired cognition, pt is a high fall risk, pt is not safe to be alone, and concern for pt safely navigating or managing the home environment.     IF patient discharges home will need the following DME: continuing to assess with progress     SUBJECTIVE:    Patient stated My daughter helps me.      OBJECTIVE DATA SUMMARY:   Cognitive/Behavioral Status:  Orientation  Overall Orientation Status: Within Functional Limits  Orientation Level: Oriented X4  Cognition  Overall Cognitive Status: Exceptions  Arousal/Alertness: Delayed responses to stimuli  Following Commands: Follows one step commands with increased time;Follows one step commands with repetition  Attention Span: Appears intact  Memory: Impaired  Safety Judgement: Decreased awareness of need for safety  Problem Solving: Assistance required to implement solutions;Assistance required to identify errors made;Assistance required to generate solutions;Assistance required to correct errors made  Insights: Decreased awareness of deficits  Initiation: Requires cues for some  Sequencing: Requires cues for some    Functional Mobility and Transfers for ADLs:  Bed Mobility:  Bed Mobility Training  Bed Mobility Training: Yes  Overall Level of Assistance: Partial/Moderate assistance  Interventions: Verbal cues;Safety awareness training  Rolling: Partial/Moderate assistance  Supine to Sit: Partial/Moderate assistance  Sit to Supine: Partial/Moderate assistance  Scooting: Partial/Moderate assistance    Transfers:  Art Therapist: Yes  Overall Level of Assistance: Substantial/Maximal assistance;2 Person assistance  Interventions: Safety awareness training;Verbal cues  Sit to Stand: Substantial/Maximal assistance;2 Person assistance  Stand to Sit: Substantial/Maximal assistance;2 Person assistance      Balance:  Balance  Sitting: Intact  Standing: Impaired  Standing - Static: Constant support;Fair  Standing - Dynamic: Constant support;Fair      ADL Intervention:    Grooming: Setup  Grooming Skilled Clinical Factors: To brush teeth while seated in bed    Therapeutic Intervention provided:   seated grooming and functional mobility/transfers in preparation for OOB ADLs    Pain Rating:  7/10 in her back  Pain  Intervention(s):   rest and repositioning    Activity Tolerance:   Fair     After treatment patient left in no apparent distress:   Bed locked and returned to lowest position, Patient left in no apparent distress in bed and placed in chair position., Call bell within reach, Bed/ chair alarm activated, Caregiver / family present, and Side rails x3, and nsg updated     COMMUNICATION/EDUCATION:   The patients plan of care was discussed with: Physical therapist and Registered nurse    Patient Education  Education Given To: Patient  Education Provided: Role of Therapy;Plan of Care;Orientation;Equipment;Mobility Training;Fall Prevention Strategies  Education Method: Verbal  Barriers to Learning: Cognition  Education Outcome: Demonstrated understanding;Continued education needed    Thank you for this referral.  Jacob Montour, OTD, OTR/L  Minutes: 25   "

## 2024-06-09 NOTE — Plan of Care (Signed)
 "Speech LAnguage Pathology Dysphagia TREATMENT    Patient: Breanna Lester (63 y.o. female)  Date: 06/09/2024  Primary Diagnosis: Acute respiratory failure, unspecified whether with hypoxia or hypercapnia (HCC) [J96.00]  Acute on chronic congestive heart failure, unspecified heart failure type (HCC) [I50.9]  Acute hypoxic respiratory failure (HCC) [J96.01]       Precautions:  General Precautions, Fall Risk, Bed Alarm      DIET RECOMMENDATIONS: Easy to chew and thin liquids    SWALLOW SAFETY PRECAUTIONS: 1:1 assistance with ALL PO intake, STRICT aspiration and GERD precautions, monitor pt closely for s/s aspiration, meds as tolerated, FEED ONLY IF AWAKE AND ALERT.    ASSESSMENT :  Based on the objective data described below, the patient presents with mild oropharyngeal dysphagia negatively impacted by respiratory status.     Pt seen for follow-up, alert, agreeable.  Pt on reg/thin diet, nsg reports is tolerating with reduced intake.     Pt initially attempting to get out of bed upon clinician entry. Pt repositioned with assistance from nsg, upright in bed. Pt re-oriented and pt is responsive to re-orientation. Room light turned on.     Pt given trials of thin via cup/straw, puree and soft solids.   Oral phase functional with slowed mastication, but functional. Pt able to clear oral cavity. Pharyngeal phase appears largely [facility]. Increased WOB with rapid sequential sips. Mild intermittent throat clear. No further s/s aspiration. Nsg reports pt tolerating meds whole with thin without difficulty.     Patient will benefit from skilled intervention to address the above impairments.    GOALS:  Problem: SLP Adult - Impaired Swallowing  Goal: By Discharge: Advance to least restrictive diet without signs or symptoms of aspiration for planned discharge setting.  See evaluation for individualized goals.  Description: Speech Therapy Swallow Goals    Initiated 06/06/2024    -Patient stated goal: pt unable to state    -Patient will  tolerate Clear liquid, mildly thick liquids, and meds crushed in applesauce diet without clinical indicators of aspiration given mod cues within 7 day(s).        -Patient will tolerate PO trials without clinical indicators of aspiration given mod cues within 7 day(s).      -Patient will participate in modified barium swallow study within 7 day(s), as indicated pending pt's progress and diet tolerance.      -Patient will demonstrate understanding of swallow safety precautions and aspiration precautions, diet recs with no cues within 7 day(s).       Outcome: Progressing     PLAN :  Recommendations and Planned Interventions:  DIET RECOMMENDATIONS: Easy to chew and thin liquids    SWALLOW SAFETY PRECAUTIONS: 1:1 assistance with ALL PO intake, STRICT aspiration and GERD precautions, monitor pt closely for s/s aspiration, meds as tolerated, FEED ONLY IF AWAKE AND ALERT.    Acute SLP Services: SLP Plan of Care: 5 times/week. Patient's rehabilitation potential is considered to be Good.  Discharge Recommendations: Continue to assess pending progress     SUBJECTIVE:   Pt seen for follow-up, alert, agreeable, confusion noted    OBJECTIVE:     Past Medical History:   Diagnosis Date    Anxiety     Depression     Gastrointestinal disorder     Heart failure (HCC)     Hyperlipidemia     Hypertension     Liver disease 2009    liver failure    Pneumonia      Past Surgical History:  Procedure Laterality Date    ADENOIDECTOMY  1972    BACK SURGERY      cement discs following a fall    CATARACT REMOVAL      CESAREAN SECTION  1986 and 1993    DILATION AND CURETTAGE OF UTERUS  1999    EGD TRANSORAL BIOPSY SINGLE/MULTIPLE  05/17/2009         HYSTERECTOMY (CERVIX STATUS UNKNOWN)  2008    TONSILLECTOMY  1972    UPPER GASTROINTESTINAL ENDOSCOPY N/A 04/17/2024    ESOPHAGOGASTRODUODENOSCOPY performed by Madlyn Fendt, MD at [facility] ENDOSCOPY     Prior Level of Function/Home Situation:   Social/Functional History  Lives With: Daughter  Type of Home:   [facility])  Home Layout: One level  Home Access: Level entry  Bathroom Shower/Tub: Tub/Shower unit  Home Equipment: Oxygen  Has the patient had two or more falls in the past year or any fall with injury in the past year?: Yes (2 falls in the past 3 months)  Receives Help From: Family  Prior Level of Assist for ADLs: Needs assistance  Toileting: Needs assistance  Prior Level of Assist for Homemaking: Needs assistance  Prior Level of Assist for Ambulation: Independent community ambulator, with or without device  Prior Level of Assist for Transfers: Needs assistance    Hearing: appears [facility]     Start of Session End of Session   Heart Rate 60s 60s   SPO2 97 97     Cognitive and Communication Status:  Neurologic State: Alert  Orientation Level: Oriented to person  Cognition: Follows commands, Impaired decision making, and pt reports hallucination, nsg aware    Voice:  Low amplitude, improved from initial assessment    Respiratory Status/Airway:  Nasal cannula, 2LPM    Outcome Measure:  Functional Oral Intake Scale (FOIS): 6--Total oral diet with multiple consistencies without special preparation, but with specific food limitations    After Treatment:  Patient left in no apparent distress in bed, Call bell left within reach, Nursing notified, and Updated patient's board with:  diet recommendations     Pain:  VAS (numerical) 0    COMMUNICATION/EDUCATION:   Swallow safety precautions, Aspiration precautions, Diet recommendations, and Prognosis and SLP POC education provided to Patient via explanation, all questions/concerns addressed.  Patient demonstrated Fair understanding as evidenced by Verbalizing understanding and Limited understanding/response due to cognitive status.     The patient's plan of care including recommendations, planned interventions, and recommended diet changes were discussed with: Registered nurse    Patient/family agree to work toward stated goals and plan of care    Thank you,  Tinnie GORMAN Satterfield, M.S.  CCC-SLP          "

## 2024-06-09 NOTE — Progress Notes (Signed)
 "Comprehensive Nutrition Assessment    Type and Reason for Visit:  Reassess    Nutrition Recommendations/Plan:   Continue Current Diet  Encourage PO intakes >50%  Monitor/document wt, BM, PO% in I/O      Malnutrition Assessment:  Malnutrition Status:  At risk for malnutrition (06/04/24 1141)    Context:  Acute Illness         Nutrition Assessment:    (10/20) Patient extubated, receives a 3 CHO diet, unable to assess po intake, SLP states unable to complete assessment, patient will not take food. Current weight is down, BMI continues at Obesity Class 3. K, Mag, Phos WNL. Will refer patient to out patient nutrition counseling.     Pt admitted with pneumonia. Pt with continued respiratory distress on biPAP, intubated and admitted to ICU. MD started TF, RD to modify per discussion in IDRs. Pt reportedly housing insecure, staying in motels. Food insecurity noted on last admission. Recommend Vital HP@15ml /hr +2PS BID. Propofol  currently at 50 mcg/kg/min (36.8 mL/hr, 972 kcals/day). NE @ 5 mcg/min. Labs and meds reviewed. A1C 8.2%.    Nutrition Related Findings:      Wound Type: None       Last BM: 06/09/24  Edema: Right lower extremity, Left lower extremity, Right upper extremity, Left upper extremity      RUE Edema: Trace  LUE Edema: Trace  RLE Edema: Trace  LLE Edema: Trace    Nutr. Labs:    Lab Results   Component Value Date    CREATININE 0.66 06/09/2024    BUN 20 06/09/2024    NA 142 06/09/2024    K 3.8 06/09/2024    CL 102 06/09/2024    CO2 34 (H) 06/09/2024       Lab Results   Component Value Date/Time    POCGLU 127 06/09/2024 08:59 AM    POCGLU 106 06/09/2024 04:17 AM    POCGLU 118 06/08/2024 11:56 PM    POCGLU 144 06/08/2024 08:17 PM    POCGLU 169 06/08/2024 06:24 PM    POCGLU 163 06/08/2024 11:18 AM        Hemoglobin A1C   Date Value Ref Range Status   04/24/2024 8.2 (H) 4.0 - 5.6 % Final     Comment:     Reference Range  Normal       <5.7%  Prediabetes  5.7-6.4%  Diabetes     >6.4%         Lab Results   Component  Value Date/Time    MG 2.0 06/09/2024 02:00 AM       Lab Results   Component Value Date    CALCIUM  8.3 (L) 06/09/2024    PHOS 2.6 06/09/2024       Lab Results   Component Value Date    TRIG 127 04/15/2024       Nutr. Meds:  Scheduled Meds:   metoprolol  tartrate  50 mg Oral BID    OLANZapine   10 mg Oral Nightly    atorvastatin   40 mg Oral Nightly    budesonide   0.5 mg Nebulization BID RT    insulin  glargine  10 Units SubCUTAneous Daily    insulin  lispro  0-8 Units SubCUTAneous Q4H    ipratropium 0.5 mg-albuterol  2.5 mg  1 Dose Inhalation Q4H WA RT    [Held by provider] tiotropium bromide   2 puff Inhalation Daily RT    [Held by provider] budesonide -formoterol   2 puff Inhalation BID RT    bumetanide   1 mg IntraVENous Daily  cefTRIAXone  (ROCEPHIN ) IV  2,000 mg IntraVENous Q24H    sertraline   100 mg Oral Daily    apixaban   5 mg Per NG tube BID    sodium chloride  flush  5-40 mL IntraVENous 2 times per day    lidocaine  1 % injection  50 mg IntraDERmal Once    sodium chloride  flush  5-40 mL IntraVENous 2 times per day     Continuous Infusions:   sodium chloride       dextrose       sodium chloride       sodium chloride  Stopped (06/05/24 2344)     PRN Meds: haloperidol  lactate, ipratropium 0.5 mg-albuterol  2.5 mg, sodium chloride , albuterol , hydrALAZINE , glucose, dextrose  bolus **OR** dextrose  bolus, glucagon  (rDNA), dextrose , sodium chloride  flush, sodium chloride , ALTEplase , sodium chloride  flush, sodium chloride , potassium chloride  **OR** potassium chloride , magnesium  sulfate, ondansetron  **OR** ondansetron , polyethylene glycol, acetaminophen  **OR** acetaminophen          Current Nutrition Intake & Therapies:    Average Meal Intake: Unable to assess  Average Supplements Intake: None Ordered  ADULT DIET; Regular; 3 carb choices (45 gm/meal)    Anthropometric Measures:  Height: 157.5 cm (5' 2)  Ideal Body Weight (IBW): 110 lbs (50 kg)       Current Body Weight: 119.6 kg (263 lb 10.7 oz), 239.7 % IBW. Weight Source: Bed  scale  Current BMI (kg/m2): 48.2  Usual Body Weight: 125 kg (275 lb 9.2 oz) (September 2025)     % Weight Change (Calculated): -4.3  Weight Adjustment For: No Adjustment                 BMI Categories: Obese Class 3 (BMI 40.0 or greater)    Estimated Daily Nutrient Needs:  Energy Requirements Based On: Formula  Weight Used for Energy Requirements: Current  Energy (kcal/day): 1650-2300 kcals (20 kcal/kg, MSJ 1.0/1.0)  Weight Used for Protein Requirements: Current  Protein (g/day): 92-115 gr (0.8-1.0 gr/kg)  Method Used for Fluid Requirements: 1 ml/kcal  Fluid (ml/day): 1600-2300 ml    Nutrition Diagnosis:   Inadequate oral intake related to cognitive or neurological impairment as evidenced by intake 0-25%    Nutrition Interventions:   Food and/or Nutrient Delivery: Continue Current Diet  Nutrition Education/Counseling: Education/Counseling needed  Coordination of Nutrition Care: Continue to monitor while inpatient       Goals:  Goals: PO intake 50% or greater, by next RD assessment  Type of Goal: New goal  Previous Goal Met: No Progress toward Goal(s)    Nutrition Monitoring and Evaluation:   Behavioral-Environmental Outcomes: None Identified  Food/Nutrient Intake Outcomes: Food and Nutrient Intake  Physical Signs/Symptoms Outcomes: Meal Time Behavior    Discharge Planning:    Recommend pursue outpatient nutrition counseling     Delon Mayor, RD  Contact: perfectserv    "

## 2024-06-10 ENCOUNTER — Inpatient Hospital Stay: Admit: 2024-06-10 | Payer: Medicare (Managed Care) | Primary: Diagnostic Radiology

## 2024-06-10 LAB — COMPREHENSIVE METABOLIC PANEL
ALT: 16 U/L (ref 10–35)
AST: 31 U/L (ref 10–35)
Albumin/Globulin Ratio: 0.7 — ABNORMAL LOW (ref 1.1–2.2)
Albumin: 2.2 g/dL — ABNORMAL LOW (ref 3.5–5.2)
Alk Phosphatase: 82 U/L (ref 35–104)
Anion Gap: 8 mmol/L (ref 2–14)
BUN/Creatinine Ratio: 35 — ABNORMAL HIGH (ref 12–20)
BUN: 22 mg/dL (ref 8–23)
CO2: 33 mmol/L — ABNORMAL HIGH (ref 20–29)
Calcium: 8.2 mg/dL — ABNORMAL LOW (ref 8.8–10.2)
Chloride: 100 mmol/L (ref 98–107)
Creatinine: 0.63 mg/dL (ref 0.60–1.00)
Est, Glom Filt Rate: >90 ml/min/1.73m2 (ref >59)
Globulin: 3.3 g/dL (ref 2.0–4.0)
Glucose: 89 mg/dL (ref 65–100)
Potassium: 3.8 mmol/L (ref 3.5–5.1)
Sodium: 141 mmol/L (ref 136–145)
Total Bilirubin: 0.8 mg/dL (ref 0.0–1.2)
Total Protein: 5.4 g/dL — ABNORMAL LOW (ref 6.4–8.3)

## 2024-06-10 LAB — CBC
Hematocrit: 34.9 % — ABNORMAL LOW (ref 35.0–47.0)
Hemoglobin: 10.1 g/dL — ABNORMAL LOW (ref 11.5–16.0)
MCH: 25.3 pg — ABNORMAL LOW (ref 26.0–34.0)
MCHC: 28.9 g/dL — ABNORMAL LOW (ref 30.0–36.5)
MCV: 87.3 FL (ref 80.0–99.0)
MPV: 12.1 FL (ref 8.9–12.9)
Nucleated RBCs: 0 /100{WBCs}
Platelets: 103 K/uL — ABNORMAL LOW (ref 150–400)
RBC: 4 M/uL (ref 3.80–5.20)
RDW: 17.4 % — ABNORMAL HIGH (ref 11.5–14.5)
WBC: 7.9 K/uL (ref 3.6–11.0)
nRBC: 0 K/uL (ref 0.00–0.01)

## 2024-06-10 LAB — POCT GLUCOSE
POC Glucose: 94 mg/dL (ref 65–100)
POC Glucose: 93 mg/dL (ref 65–100)
POC Glucose: 77 mg/dL (ref 65–100)
POC Glucose: 120 mg/dL — ABNORMAL HIGH (ref 65–100)
POC Glucose: 116 mg/dL — ABNORMAL HIGH (ref 65–100)

## 2024-06-10 LAB — CULTURE, BLOOD 1: Culture: NO GROWTH

## 2024-06-10 LAB — PROCALCITONIN: Procalcitonin: 0.11 ng/mL

## 2024-06-10 LAB — TSH + FREE T4 PANEL
T4 Free: 1 ng/dL (ref 0.9–1.6)
TSH, 3rd Generation: 2.4 u[IU]/mL (ref 0.270–4.200)

## 2024-06-10 MED ORDER — BENZONATATE 100 MG PO CAPS
100 | Freq: Three times a day (TID) | ORAL | Status: DC | PRN
Start: 2024-06-10 — End: 2024-06-16
  Administered 2024-06-10: 16:00:00 100 mg via ORAL

## 2024-06-10 MED ORDER — GUAIFENESIN ER 600 MG PO TB12
600 | Freq: Two times a day (BID) | ORAL | Status: DC
Start: 2024-06-10 — End: 2024-06-16
  Administered 2024-06-10 – 2024-06-16 (×13): 600 mg via ORAL

## 2024-06-10 MED ORDER — INFLUENZA VAC TISS-CULT SUBUNT 0.5 ML IM SUSY
0.5 | INTRAMUSCULAR | Status: DC
Start: 2024-06-10 — End: 2024-06-16

## 2024-06-10 MED ORDER — IPRATROPIUM-ALBUTEROL 0.5-2.5 (3) MG/3ML IN SOLN
0.5-2.5 | Freq: Four times a day (QID) | RESPIRATORY_TRACT | Status: DC
Start: 2024-06-10 — End: 2024-06-12
  Administered 2024-06-10 – 2024-06-12 (×4): 1 via RESPIRATORY_TRACT

## 2024-06-10 MED ORDER — SODIUM CHLORIDE 0.9 % IV SOLN (ADDEASE)
0.9 | Freq: Two times a day (BID) | INTRAVENOUS | Status: AC
Start: 2024-06-10 — End: 2024-06-15
  Administered 2024-06-10 – 2024-06-15 (×10): 100 mg via INTRAVENOUS

## 2024-06-10 MED FILL — ELIQUIS 5 MG PO TABS: 5 mg | ORAL | Qty: 1

## 2024-06-10 MED FILL — CEFTRIAXONE SODIUM 2 G IJ SOLR: 2 g | INTRAMUSCULAR | Qty: 2000

## 2024-06-10 MED FILL — BUMETANIDE 0.25 MG/ML IJ SOLN: 0.25 mg/mL | INTRAMUSCULAR | Qty: 4

## 2024-06-10 MED FILL — OLANZAPINE 10 MG PO TABS: 10 mg | ORAL | Qty: 1 | Fill #0

## 2024-06-10 MED FILL — IPRATROPIUM-ALBUTEROL 0.5-2.5 (3) MG/3ML IN SOLN: 0.5-2.5 (3) MG/3ML | RESPIRATORY_TRACT | Qty: 3

## 2024-06-10 MED FILL — BUDESONIDE 0.5 MG/2ML IN SUSP: 0.5 MG/2ML | RESPIRATORY_TRACT | Qty: 2

## 2024-06-10 MED FILL — BENZONATATE 100 MG PO CAPS: 100 mg | ORAL | Qty: 1

## 2024-06-10 MED FILL — MUCUS RELIEF ER 600 MG PO TB12: 600 mg | ORAL | Qty: 1

## 2024-06-10 MED FILL — DOXYCYCLINE HYCLATE 100 MG IV SOLR: 100 mg | INTRAVENOUS | Qty: 100

## 2024-06-10 MED FILL — ATORVASTATIN CALCIUM 40 MG PO TABS: 40 mg | ORAL | Qty: 1 | Fill #0

## 2024-06-10 MED FILL — ELIQUIS 5 MG PO TABS: 5 mg | ORAL | Qty: 1 | Fill #0

## 2024-06-10 MED FILL — TYLENOL 325 MG PO TABS: 325 mg | ORAL | Qty: 2

## 2024-06-10 MED FILL — LANTUS 100 UNIT/ML SC SOLN: 100 [IU]/mL | SUBCUTANEOUS | Qty: 10

## 2024-06-10 MED FILL — METOPROLOL TARTRATE 50 MG PO TABS: 50 mg | ORAL | Qty: 1

## 2024-06-10 MED FILL — SERTRALINE HCL 50 MG PO TABS: 50 mg | ORAL | Qty: 2

## 2024-06-10 NOTE — Plan of Care (Signed)
 "OCCUPATIONAL THERAPY TREATMENT  Patient: Breanna Lester (63 y.o. female)  Date: 06/10/2024  Primary Diagnosis: Acute respiratory failure, unspecified whether with hypoxia or hypercapnia (HCC) [J96.00]  Acute on chronic congestive heart failure, unspecified heart failure type (HCC) [I50.9]  Acute hypoxic respiratory failure (HCC) [J96.01]       Precautions: General Precautions, Fall Risk, Bed Alarm                Recommendations for nursing mobility: Out of bed to chair for meals, Encourage HEP in prep for ADLs/mobility; see handout for details, Frequent repositioning to prevent skin breakdown, Use of bed/chair alarm for safety, Use of BSC for toileting , AD and gt belt for bed to chair , and Assist x2    In place during session: Mid line, Nasal Cannula 3L, External Catheter, EKG/telemetry , and Pulse ox  Chart, occupational therapy assessment, plan of care, and goals were reviewed.  ASSESSMENT  Patient continues with skilled OT services and is progressing towards goals. Pt presented semi supine  upon COTA/PT arrival, agreeable to session. Pt A&O x 4. Pt transferred to EOB with increase time and min A x2.  Pt demonstrated good sitting balance.  Pt completed sit to stand with min A x2. Pt took side steps than forward in prep for [facility] transfers.  Pt returned to sit EOB and demonstrated LOB vs fatigue and completed uncontrolled descent onto the bed.  Pt transferred to recliner after a rest breaks.  Pt instructed in PLB.  Pt's oxygen dropped into low 80s and required rest breaks until returned to 90s. Pt required min Ax2 and completed an uncontrolled descent to the chair.  Pt reported fatigue. Pt left in recliner with PT in room.   (See below for objective details and assist levels).     Overall pt tolerated session fair today  demonstrating improvement in bed mobility, sit to stands and functional ambulation.  Current OT recommendations for discharge Moderate intensity short-term skilled occupational therapy up to  5x/week. Will continue to benefit from skilled OT services, and will continue to progress as tolerated.      Start of Session   SPO2 (%) 95   Heart Rate (BPM) 83     GOALS:    Problem: Occupational Therapy - Adult  Goal: By Discharge: Performs self-care activities at highest level of function for planned discharge setting.  See evaluation for individualized goals.  Description: FUNCTIONAL STATUS PRIOR TO ADMISSION:  Pt required assistance with ADLs and IADLs    HOME SUPPORT: The patient lived with daughter to provide assistance.    Occupational Therapy Goals:  Initiated 06/09/2024  Patient/Family stated goal: Unstated  1.  Patient will perform grooming with Independence within 7 day(s).  2.  Patient will perform upper body dressing with Independence within 7 day(s).  3.  Patient will perform lower body dressing with Minimal Assist within 7 day(s).  4.  Patient will perform toilet transfers with Moderate Assist  within 7 day(s).  5.  Patient will perform all aspects of toileting with Moderate Assist within 7 day(s).  6.  Patient will participate in upper extremity therapeutic exercise/activities with Independence within 7 day(s).  Outcome: Progressing        PLAN :  Patient continues to benefit from skilled intervention to address functional impairments. Continue treatment per established plan of care to address goals.    Recommend next OT session: UB dressing and LB dressing    Recommendation for discharge: (in order for the patient  to meet his/her long term goals): Moderate intensity short-term skilled occupational therapy up to 5x/week    Potential barriers for safe discharge: pt has poor safety awareness, pt is a high fall risk, pt is not safe to be alone, and concern for pt safely navigating or managing the home environment.     IF patient discharges home will need the following DME: continuing to assess with progress     SUBJECTIVE:   Patient stated I want to get out of here.      OBJECTIVE DATA SUMMARY:    Cognitive/Behavioral Status:  Orientation  Orientation Level: Oriented X4  Cognition  Overall Cognitive Status: Exceptions  Arousal/Alertness: Appears intact  Following Commands: Appears intact  Attention Span: Appears intact  Memory: Appears intact  Safety Judgement: Decreased awareness of need for safety  Problem Solving: Assistance required to implement solutions;Assistance required to identify errors made;Assistance required to generate solutions;Assistance required to correct errors made  Insights: Fully aware of deficits  Initiation: Requires cues for some  Sequencing: Requires cues for some    Functional Mobility and Transfers for ADLs:  Bed Mobility:  Bed Mobility Training  Bed Mobility Training: Yes  Interventions: Verbal cues;Safety awareness training  Supine to Sit: Minimal assistance (increase time and A with trunk)  Scooting: Minimal assistance    Transfers:  Transfer Training  Transfer Training: Yes  Interventions: Safety awareness training;Verbal cues  Sit to Stand: Minimal assistance;2 Person assistance  Bed to Chair: Minimal assistance;2 Person assistance      Balance:  Balance  Sitting: Intact  Standing: Impaired  Standing - Static: Constant support;Fair  Standing - Dynamic: Constant support;Fair      ADL Intervention:      Therapeutic Intervention provided:   functional mobility/transfers in preparation for ADLs      Pain Rating:  2/10 Back reported  Pain Intervention(s):   repositioning    Activity Tolerance:   Fair , requires rest breaks, and observed shortness of breath on exertion    After treatment patient left in no apparent distress:   Bed locked and returned to lowest position, Patient left in no apparent distress sitting up in chair, Call bell within reach, and Bed/ chair alarm activated, and nsg updated     COMMUNICATION/EDUCATION:   The patients plan of care was discussed with: Physical therapist    Patient Education  Education Given To: Patient  Education Provided: Plan of  Microbiologist;Fall Prevention Strategies  Education Provided Comments: proper hand postions with sit<>stands  Education Method: Verbal  Barriers to Learning: None  Education Outcome: Continued education needed    Thank you for this referral.  Choua Ikner, OTA  Minutes: 25    "

## 2024-06-10 NOTE — Consults (Addendum)
 "Pulmonary and Critical Care Consult    Subjective:   Consult Note: 06/10/2024 @no  control      Chief Complaint:   Chief Complaint   Patient presents with    Chest Pain        This patient has been seen and evaluated at the request of Dr. Phill    63 year old obese Caucasian lady  I am asked to see for acute respiratory failure with hypoxia and hypercapnia    Patient has a past medical history significant for chronic hypoxic respiratory failure on 3 L nasal cannula secondary to COPD, obstructive sleep apnea on CPAP, anxiety, depression, HTN, A-fib on Eliquis , T2DM, pancreatitis and EtOH cirrhosis     Presented to ED on 06/03/2024 for worsening dyspnea.  In ED, patient severely hypoxic with increased work of breathing which was refractory to BiPAP, requiring intubation.  CTA chest shows extensive bilateral pulmonary infiltrates concerning for severe bilateral pneumonia, however negative for PE.  Started on empiric IV ceftriaxone  and azithromycin .  Respiratory status improved and patient was able to be extubated to BiPAP and further decreased to home dose 3 L NC.  However, patient remained confused so psychiatry was consulted.  Started on Zoloft  and Zyprexa .  Stroke alert was called on 10/17 for left-sided weakness with complete left hemianopia.  CT head, CTA head/neck and CT brain perfusion with no acute findings.  Patient unable to tolerate MRI.  Neurology evaluated, consider EEG if clinical status worsening otherwise no further stroke work up.      Transferred to medical service on 10/20.  I was asked to see the patient to take over her pulmonary management by intensivist Dr. Azeem.    ABGs 06/09/2024 showed:  pH of 7.40, pCO2 60, pO2 81, bicarb 37, saturation 96% on BiPAP with 32% FiO2    Pt with increased work of breathing and lethargy on 10/21 with repeat ABG showing worsening hypercapnia of 60.  Started on nightly BiPAP with significant improvement in breathing and normalization of mental status.      Echocardiogram 05/05/2024 reviewed personally showing:  LVEF 70 to 75%  Dilated RA and RV    Last chest x-ray 06/05/24 reviewed personally showing:  Patient was still intubated at that time  Bilateral infiltrates    Currently on nasal cannula oxygen    Review of Systems:  Pertinent items are noted in HPI.    Past Medical History:   Diagnosis Date    Anxiety     Depression     Gastrointestinal disorder     Heart failure (HCC)     Hyperlipidemia     Hypertension     Liver disease 2009    liver failure    Pneumonia      Past Surgical History:   Procedure Laterality Date    ADENOIDECTOMY  1972    BACK SURGERY      cement discs following a fall    CATARACT REMOVAL      CESAREAN SECTION  1986 and 1993    DILATION AND CURETTAGE OF UTERUS  1999    EGD TRANSORAL BIOPSY SINGLE/MULTIPLE  05/17/2009         HYSTERECTOMY (CERVIX STATUS UNKNOWN)  2008    TONSILLECTOMY  1972    UPPER GASTROINTESTINAL ENDOSCOPY N/A 04/17/2024    ESOPHAGOGASTRODUODENOSCOPY performed by Madlyn Fendt, MD at [facility] ENDOSCOPY      Family History   Problem Relation Age of Onset    Cancer Mother     Heart Disease Father  Social History     Tobacco Use    Smoking status: Former     Current packs/day: 0.00     Types: Cigarettes     Quit date: 02/28/2008     Years since quitting: 16.2    Smokeless tobacco: Never   Substance Use Topics    Alcohol use: Never      Current Facility-Administered Medications   Medication Dose Route Frequency Provider Last Rate Last Admin    guaiFENesin  (MUCINEX ) extended release tablet 600 mg  600 mg Oral BID Gillard, Madison A, PA-C        benzonatate  (TESSALON ) capsule 100 mg  100 mg Oral TID PRN Gillard, Madison A, PA-C        bumetanide  (BUMEX ) injection 1 mg  1 mg IntraVENous Daily Azeem, Ahad, MD   1 mg at 06/10/24 0826    metoprolol  tartrate (LOPRESSOR ) tablet 50 mg  50 mg Oral BID Azeem, Ahad, MD   50 mg at 06/10/24 0826    OLANZapine  (ZYPREXA ) tablet 10 mg  10 mg Oral Nightly Punyala, Srinivasa R, MD   10 mg at 06/09/24  2047    atorvastatin  (LIPITOR ) tablet 40 mg  40 mg Oral Nightly Azeem, Ahad, MD   40 mg at 06/09/24 2047    haloperidol  lactate (HALDOL ) injection 5 mg  5 mg IntraVENous Q6H PRN Azeem, Ahad, MD   5 mg at 06/08/24 0438    budesonide  (PULMICORT ) nebulizer suspension 500 mcg  0.5 mg Nebulization BID RT Nunna, Krishidhar R, MD   500 mcg at 06/10/24 9096    insulin  glargine (LANTUS ) injection vial 10 Units  10 Units SubCUTAneous Daily Nunna, Krishidhar R, MD   10 Units at 06/10/24 0826    ipratropium 0.5 mg-albuterol  2.5 mg (DUONEB ) nebulizer solution 1 Dose  1 Dose Inhalation Q4H PRN Nunna, Krishidhar R, MD   1 Dose at 06/08/24 0304    insulin  lispro (HUMALOG ,ADMELOG ) injection vial 0-8 Units  0-8 Units SubCUTAneous Q4H Nunna, Krishidhar R, MD   2 Units at 06/07/24 2042    0.9 % sodium chloride  infusion   IntraVENous PRN Nunna, Krishidhar R, MD        ipratropium 0.5 mg-albuterol  2.5 mg (DUONEB ) nebulizer solution 1 Dose  1 Dose Inhalation Q4H WA RT Nunna, Krishidhar R, MD   1 Dose at 06/10/24 9096    albuterol  (PROVENTIL ) nebulizer solution 2.5 mg  2.5 mg Nebulization Q4H PRN Nunna, Krishidhar R, MD        [Held by provider] tiotropium bromide  (SPIRIVA  RESPIMAT) 2.5 MCG/ACT inhaler 2 puff  2 puff Inhalation Daily RT Rawland, Veleta SAUNDERS, MD        [Held by provider] budesonide -formoterol  (SYMBICORT ) 160-4.5 MCG/ACT inhaler 2 puff  2 puff Inhalation BID RT Nunna, Krishidhar R, MD        hydrALAZINE  (APRESOLINE ) injection 10 mg  10 mg IntraVENous Q4H PRN Sahib, Taaliba A, APRN - NP   10 mg at 06/07/24 0202    cefTRIAXone  (ROCEPHIN ) 2,000 mg in sterile water  20 mL IV syringe  2,000 mg IntraVENous Q24H Gillard, Madison A, PA-C   2,000 mg at 06/10/24 0052    glucose chewable tablet 16 g  4 tablet Oral PRN Rawland Veleta SAUNDERS, MD        dextrose  bolus 10% 125 mL  125 mL IntraVENous PRN Rawland, Krishidhar R, MD        Or    dextrose  bolus 10% 250 mL  250 mL IntraVENous PRN Nunna, Krishidhar R,  MD        glucagon  injection 1 mg   1 mg SubCUTAneous PRN Nunna, Krishidhar R, MD        dextrose  10 % infusion   IntraVENous Continuous PRN Nunna, Krishidhar R, MD        sertraline  (ZOLOFT ) tablet 100 mg  100 mg Oral Daily Nunna, Krishidhar R, MD   100 mg at 06/10/24 9173    apixaban  (ELIQUIS ) tablet 5 mg  5 mg Per NG tube BID Nunna, Krishidhar R, MD   5 mg at 06/10/24 9173    sodium chloride  flush 0.9 % injection 5-40 mL  5-40 mL IntraVENous 2 times per day Marvis Lynwood HERO, APRN - CNP   10 mL at 06/10/24 0827    sodium chloride  flush 0.9 % injection 5-40 mL  5-40 mL IntraVENous PRN Marvis Lynwood HERO, APRN - CNP        0.9 % sodium chloride  infusion   IntraVENous PRN Marvis Lynwood HERO, APRN - CNP        lidocaine  1 % injection 50 mg  50 mg IntraDERmal Once Marvis Lynwood HERO, APRN - CNP        ALTEplase  (CATHFLO) injection 2 mg  2 mg IntraCATHeter PRN Marvis Lynwood HERO, APRN - CNP        sodium chloride  flush 0.9 % injection 5-40 mL  5-40 mL IntraVENous 2 times per day Marvis Lynwood HERO, APRN - CNP   10 mL at 06/10/24 0842    sodium chloride  flush 0.9 % injection 5-40 mL  5-40 mL IntraVENous PRN Marvis Lynwood HERO, APRN - CNP        0.9 % sodium chloride  infusion   IntraVENous PRN Marvis Lynwood HERO, APRN - CNP   Stopped at 06/05/24 2344    potassium chloride  20 mEq/50 mL IVPB (Central Line)  20 mEq IntraVENous PRN Marvis Lynwood HERO, APRN - CNP        Or    potassium chloride  10 mEq/100 mL IVPB (Peripheral Line)  10 mEq IntraVENous PRN Marvis Lynwood HERO, APRN - CNP        magnesium  sulfate 2000 mg in 50 mL IVPB premix  2,000 mg IntraVENous PRN Marvis Lynwood HERO, APRN - CNP        ondansetron  (ZOFRAN -ODT) disintegrating tablet 4 mg  4 mg Oral Q8H PRN Marvis Lynwood HERO, APRN - CNP        Or    ondansetron  (ZOFRAN ) injection 4 mg  4 mg IntraVENous Q6H PRN Marvis Lynwood HERO, APRN - CNP   4 mg at 06/07/24 1021    polyethylene glycol (GLYCOLAX ) packet 17 g  17 g Oral Daily PRN Marvis Lynwood HERO, APRN - CNP        acetaminophen  (TYLENOL ) tablet 650 mg  650 mg Oral Q6H PRN Marvis Lynwood HERO, APRN - CNP   650 mg at  06/07/24 2024    Or    acetaminophen  (TYLENOL ) suppository 650 mg  650 mg Rectal Q6H PRN Marvis Lynwood HERO, APRN - CNP              Allergies   Allergen Reactions    Latex Rash           Objective:     Blood pressure (!) 152/67, pulse 54, temperature 98.2 F (36.8 C), temperature source Axillary, resp. rate 16, height 1.575 m (5' 2), weight 112.2 kg (247 lb 5.7 oz), SpO2 100%. Temp (24hrs), Avg:97.9 F (36.6 C), Min:97.5 F (36.4 C),  Max:98.2 F (36.8 C)      Intake and Output:  Current Shift: No intake/output data recorded.  Last 3 Shifts: 10/19 1901 - 10/21 0700  In: 80 [P.O.:50; I.V.:30]  Out: 2300 [Urine:2300]  @INTAKEOUTPUTBRIEF @     Physical Exam:     General: Lying in bed comfortably, mild respiratory distress.  On nasal cannula oxygen.  Obese  Eye: Reactive, symmetric  Throat and Neck: Supple  Lung: Reduced air entry bilaterally with prolonged exhalation.  Occasional wheezing.  Bilateral crackles   Heart: S1+S2.  No murmurs  Abdomen: soft, non-tender. Bowel sounds normal. No masses; obese  Extremities: No edema  GU: Not done  Skin: No cyanosis  Neurologic: A & O x3.  Grossly nonfocal  Psychiatric: Mildly anxious      Lab/Data Review:      Recent Results (from the past 24 hours)   POCT Glucose    Collection Time: 06/09/24 11:50 AM   Result Value Ref Range    POC Glucose 127 (H) 65 - 100 mg/dL    Performed by: Elyse Been    Procalcitonin    Collection Time: 06/09/24 12:30 PM   Result Value Ref Range    Procalcitonin 0.11 ng/mL   POCT Glucose    Collection Time: 06/09/24  4:30 PM   Result Value Ref Range    POC Glucose 120 (H) 65 - 100 mg/dL    Performed by: Christobal Cathryne Pool    Blood Gas, Arterial    Collection Time: 06/09/24  6:59 PM   Result Value Ref Range    pH, Arterial 7.40 7.35 - 7.45      pCO2, Arterial 60 (H) 35 - 45 mmHg    pO2, Arterial 81 80 - 100 mmHg    O2 Sat, Arterial 96 95 - 99 %    HCO3, Arterial 37 (H) 22 - 26 mmol/L    Base Excess, Arterial 9.7 (H) 0 - 3 mmol/L    O2 Method Nasal  Cannula      O2 Flow Rate 3.00 L/min    FIO2 Arterial 32.0 %    Source Arterial      Site Right Radial      Allen Test YES      Carboxyhgb, Arterial 1.1 1 - 2 %    Methemoglobin, Arterial 0.3 0 - 1.4 %    Oxyhemoglobin 94.3 (L) 95 - 99 %    Performed by: Ronal Slocumb     Temperature 97.7     POCT Glucose    Collection Time: 06/09/24  8:39 PM   Result Value Ref Range    POC Glucose 94 65 - 100 mg/dL    Performed by: Andrade Cassandra    POCT Glucose    Collection Time: 06/10/24 12:19 AM   Result Value Ref Range    POC Glucose 93 65 - 100 mg/dL    Performed by: Erika Ruby    CBC    Collection Time: 06/10/24  3:00 AM   Result Value Ref Range    WBC 7.9 3.6 - 11.0 K/uL    RBC 4.00 3.80 - 5.20 M/uL    Hemoglobin 10.1 (L) 11.5 - 16.0 g/dL    Hematocrit 65.0 (L) 35.0 - 47.0 %    MCV 87.3 80.0 - 99.0 FL    MCH 25.3 (L) 26.0 - 34.0 PG    MCHC 28.9 (L) 30.0 - 36.5 g/dL    RDW 82.5 (H) 88.4 - 14.5 %    Platelets 103 (  L) 150 - 400 K/uL    MPV 12.1 8.9 - 12.9 FL    Nucleated RBCs 0.0 0.0 PER 100 WBC    nRBC 0.00 0.00 - 0.01 K/uL   Comprehensive Metabolic Panel    Collection Time: 06/10/24  3:00 AM   Result Value Ref Range    Sodium 141 136 - 145 mmol/L    Potassium 3.8 3.5 - 5.1 mmol/L    Chloride 100 98 - 107 mmol/L    CO2 33 (H) 20 - 29 mmol/L    Anion Gap 8 2 - 14 mmol/L    Glucose 89 65 - 100 mg/dL    BUN 22 8 - 23 mg/dL    Creatinine 9.36 9.39 - 1.00 mg/dL    BUN/Creatinine Ratio 35 (H) 12 - 20      Est, Glom Filt Rate >90 >59 ml/min/1.10m2    Calcium  8.2 (L) 8.8 - 10.2 mg/dL    Total Bilirubin 0.8 0.0 - 1.2 mg/dL    AST 31 10 - 35 U/L    ALT 16 10 - 35 U/L    Alk Phosphatase 82 35 - 104 U/L    Total Protein 5.4 (L) 6.4 - 8.3 g/dL    Albumin 2.2 (L) 3.5 - 5.2 g/dL    Globulin 3.3 2.0 - 4.0 g/dL    Albumin/Globulin Ratio 0.7 (L) 1.1 - 2.2     Procalcitonin    Collection Time: 06/10/24  3:00 AM   Result Value Ref Range    Procalcitonin 0.11 ng/mL   POCT Glucose    Collection Time: 06/10/24  7:52 AM   Result Value Ref  Range    POC Glucose 77 65 - 100 mg/dL    Performed by: Thurman Norris        CTA HEAD NECK W CONTRAST   Final Result         1. No acute large vessel arterial occlusion. Mild to moderate atherosclerotic   changes.   2. Pulmonary hypertension.   3. Biapical airspace disease likely infectious/inflammatory.               Electronically signed by Aletha CHRISTELLA Ham      CT BRAIN PERFUSION   Final Result         1. No acute large vessel arterial occlusion. Mild to moderate atherosclerotic   changes.   2. Pulmonary hypertension.   3. Biapical airspace disease likely infectious/inflammatory.               Electronically signed by Aletha CHRISTELLA Ham      CT HEAD WO CONTRAST   Final Result   No acute intracranial findings.         Electronically signed by PAULINE BLANCH      XR CHEST PORTABLE   Final Result   1. No change bilateral pneumonia         Electronically signed by Evalene JINNY Hoit      CT HEAD WO CONTRAST   Final Result      No acute intracranial abnormality.         Electronically signed by Toribio JINNY Musick      CTA CHEST W WO CONTRAST   Final Result   Extensive bilateral pulmonary infiltrates. No evidence of pulmonary embolism.         Electronically signed by DARICE COLON      XR CHEST PORTABLE   Final Result   1. Enlarged cardiac silhouette, worsening airspace disease or pulmonary  edema.   No complicating features post intubation            Electronically signed by Susie Brain      XR CHEST PORTABLE   Final Result   Cardiomegaly and mild interstitial pulmonary edema.             Electronically signed by JIMMY HABIB            Assessment:     1.  Acute on chronic respiratory failure with hypercapnia  2.  Acute on chronic respiratory failure with hypoxia  3.  Bilateral pneumonia  4.  Acute metabolic encephalopathy  5.  Acute exacerbation of COPD  6.  Obstructive sleep apnea  7.  Liver cirrhosis  8.  Chronic atrial fibrillation  9.  Obesity  10.  Polysubstance abuse  11.  Anemia and thrombocytopenia    Plan:     Patient  seen in the ICU  Being transferred out  Tenuous clinical status  Will be watched closely    Currently on BiPAP  Being transitioned to nasal cannula oxygen  Last ABGs showing significant hypercapnia  There was also altered mental status likely due to hypercapnia  Will continue BiPAP with sleep  Use nasal cannula oxygen while awake as tolerated  Repeat ABGs in a.m.  Further changes in BiPAP settings based on clinical response and ABG results    Bilateral pneumonia  Continue broad-spectrum antibiotics  Cultures sent  Expanded pneumonia workup sent  Further changes in antibiotics based on clinical response and culture results  Last chest x-ray showing extensive bilateral infiltrates  Will repeat chest x-ray this a.m.  Start doxycycline   Underlying COPD  Start Solu-Medrol   Continue nebulizers  Will need PFTs in outpatient after discharge  May benefit from noninvasive positive pressure ventilator at discharge    Altered mental status likely combination of hypoxia, hypercapnia, polysubstance abuse  UDS positive for amphetamines  Ammonia normal  Neurology evaluated recommended EEG depending on clinical status  Psychiatry following and have recommended Xolox and Zyprexa   Monitor neurological status with treatment    Liver cirrhosis  Will need to see GI in outpatient  Stable for now    DVT and GI prophylaxis    Tenuous clinical status  Critically ill  Prognosis guarded  Monitor closely for now  If declines will need continuous BiPAP and transfer back to ICU    Questions of patient were answered at bedside in detail  Case discussed in detail with RN, RT, and care team including hospitalist  Thank you for involving me in the care of this patient  I will follow with you closely during hospitalization    Critical time spent more than 75 minutes in direct patient care with no overlap reviewing results and records, decision making, and answering questions.      MANCEL DELENA HAIR, MD  [facility]  (PAT)  06/10/2024  9:38 AM             "

## 2024-06-10 NOTE — Progress Notes (Signed)
 "      Hospitalist Progress Note    NAME:   Breanna Lester   DOB: 1961-05-15   MRN: [id number]     Date/Time: 06/10/2024 9:06 AM  Patient PCP: Unknown, Provider    Estimated discharge date: 48h  Barriers: IV antibiotics, cultures, clinical improvement, psych/pulm clearance    Hospital Course:  Breanna Lester is a 63 y.o.  female with PMHx significant for chronic hypoxic respiratory failure on 3 L nasal cannula secondary to COPD, anxiety, depression, HTN, A-fib on Eliquis , T2DM, pancreatitis and EtOH cirrhosis who presented to ED on 06/03/2024 for worsening dyspnea.  In ED, patient severely hypoxic with increased work of breathing which was refractory to BiPAP, requiring intubation.  CTA chest shows extensive bilateral pulmonary infiltrates concerning for severe bilateral pneumonia, however negative for PE.  Started on empiric IV ceftriaxone  and azithromycin .  Respiratory status improved and patient was able to be extubated to BiPAP and further decreased to home dose 3 L NC.  However, patient remained confused so psychiatry was consulted.  Started on Zoloft  and Zyprexa .  Stroke alert was called on 10/17 for left-sided weakness with complete left hemianopia.  CT head, CTA head/neck and CT brain perfusion with no acute findings.  Patient unable to tolerate MRI.  Neurology evaluated, consider EEG if clinical status worsening otherwise no further stroke work up.  Transferred to medical service on 10/20.  Pt with increased work of breathing and lethargy on 10/21 with repeat ABG showing worsening hypercapnia of 60.  Started on nightly BiPAP with significant improvement in breathing and normalization of mental status.  Pulmonology consulted.    Assessment / Plan:  Severe bilateral CAP  COPD  Hypercapnia  Acute on chronic hypoxic respiratory failure 2/2 above  Initially requiring mechanical ventilation, now on home dose 3L NC  Wean O2 as tolerated to maintain sats >92%  Bicarb 34 > 33  Continue empiric ceftriaxone , completed  azithromycin   Continue scheduled DuoNebs and Mucinex   SLP evaluation for possible aspiration  Overnight pulse oximetry   Repeat ABG 10/20 on 3 L NC with pH 7.4, pCO2 60, bicarb 37  Started on nightly BiPAP with significant improvement in respiratory and mental status  Pulm consulted     Acute metabolic encephalopathy, resolving  Polysubstance abuse  Likely related to acute infection and polysubstance abuse  UDS positive for amphetamines  Ammonia WNL  CTH, CTA head/neck, CT brain perfusion negative for CVA  Check TSH/T4  Neurology evaluated, recommended EEG if clinical status worsening  Psychiatry following, continue Zoloft  and Zyprexa      A-fib  Currently rate controlled  Continue Eliquis  and metoprolol     Microcytic anemia  Thrombocytopenia, mild  Platelets mildly decreased likely secondary to cirrhosis  Hgb stable around 10 with decreased MCH and MCHC  Check iron panel, B12 and folate  Monitor daily CBC and for any bleeding     OSA  Nightly BiPAP as above        Medical Decision Making     [x]  High (any 2)     A. Problems (any 1)  [x]  Acute/Chronic Illness/injury posing threat to life or bodily function:    []  Severe exacerbation of chronic illness:    ---------------------------------------------------------------------  B. Risk of Treatment (any 1)   [x]  Drugs/treatments that require intensive monitoring for toxicity include:    [x]  IV ABX requiring serial renal monitoring for nephrotoxicity:     []  IV Narcotic analgesia for adverse drug reaction  []  Aggressive IV diuresis requiring  serial monitoring for renal impairment and electrolyte derangements  []  Critical electrolyte abnormalities requiring IV replacement and close serial monitoring  []  Insulin  - monitoring serial FSBS for Hypoglycemic adverse drug reaction  []  Other -   []  Change in code status:    []  Decision to escalate care:    []  Major surgery/procedure with associated risk factors:      ----------------------------------------------------------------------  C. Data (any 2)  [x]  Discussed current management and discharge planning options with Case Management.  [x]  Discussed management of the case with: Patient, RN  []  Telemetry personally reviewed and interpreted as documented above    []  Imaging personally reviewed and interpreted, includes: CXR  [x]  Data Review (any 3)  [x]  All available Consultant notes from yesterday/today were reviewed  [x]  All current labs were reviewed and interpreted for clinical significance   [x]  Appropriate follow-up labs were ordered  []  Collateral history obtained from:           Code Status: Full  DVT Prophylaxis: Eliquis   GI Prophylaxis: Not indicated    Subjective:     Chief Complaint / Reason for Physician Visit  Chart reviewed and discussed with RN events overnight.  Patient seen at bedside, resting comfortably.  Significant improvement in mental status this morning after using BiPAP last night.  Patient AO x 3 and with improved work of breathing.  Denies chest pain or shortness of breath.  Does have productive cough.  Discussed continuing IV antibiotics and obtaining pulmonology consult.  All patient questions answered.    Objective:     VITALS:   Last 24hrs VS reviewed since prior progress note. Most recent are:  Patient Vitals for the past 24 hrs:   BP Temp Temp src Pulse Resp SpO2 Weight   06/10/24 0903 -- -- -- -- -- 100 % --   06/10/24 0826 (!) 152/67 -- -- -- -- -- --   06/10/24 0700 (!) 119/49 -- -- 54 16 97 % --   06/10/24 0617 (!) 112/39 -- -- 51 16 96 % --   06/10/24 0530 -- -- -- -- -- -- 112.2 kg (247 lb 5.7 oz)   06/10/24 0500 (!) 134/57 98.2 F (36.8 C) Axillary 56 20 99 % --   06/10/24 0400 (!) 105/47 -- -- 55 18 100 % --   06/10/24 0300 (!) 110/48 -- -- 58 21 98 % --   06/10/24 0211 (!) 118/48 -- -- 53 18 96 % --   06/10/24 0210 -- -- -- -- -- 96 % --   06/10/24 0209 -- -- -- -- -- 96 % --   06/10/24 0200 (!) 100/43 -- -- 53 16 96 % --    06/10/24 0100 (!) 124/58 -- -- 63 21 97 % --   06/10/24 0019 (!) 124/49 -- -- 57 21 94 % --   06/10/24 0000 -- -- -- 53 19 95 % --   06/09/24 2331 -- 98.1 F (36.7 C) Axillary -- -- -- --   06/09/24 2300 133/63 -- -- 56 19 95 % --   06/09/24 2200 106/63 -- -- 54 17 95 % --   06/09/24 2100 (!) 148/63 -- -- 58 22 93 % --   06/09/24 2000 (!) 99/47 98 F (36.7 C) Axillary 55 17 94 % --   06/09/24 1919 -- -- -- 56 19 94 % --   06/09/24 1900 (!) 127/48 -- -- 57 18 98 % --   06/09/24 1800 130/69 -- -- 60 17 98 % --  06/09/24 1700 (!) 100/42 -- -- 56 19 96 % --   06/09/24 1600 122/60 97.8 F (36.6 C) Oral 59 21 96 % --   06/09/24 1558 (!) 151/73 97.6 F (36.4 C) Oral 60 23 98 % --   06/09/24 1500 -- -- -- 58 22 98 % --   06/09/24 1400 -- -- -- 61 24 96 % --   06/09/24 1300 (!) 141/68 -- -- 63 24 98 % --   06/09/24 1200 (!) 151/73 97.5 F (36.4 C) Oral 58 21 95 % --   06/09/24 1100 (!) 146/64 -- -- 62 22 98 % --   06/09/24 1000 104/63 -- -- 65 26 93 % --   06/09/24 0909 -- -- -- -- -- 100 % --   06/09/24 0907 -- 97.5 F (36.4 C) Oral -- -- -- --         Intake/Output Summary (Last 24 hours) at 06/10/2024 9093  Last data filed at 06/09/2024 2048  Gross per 24 hour   Intake 60 ml   Output 1300 ml   Net -1240 ml        I had a face to face encounter and independently examined this patient on 06/10/2024, as outlined below:    Review of Systems   Constitutional:  Positive for fatigue. Negative for chills and fever.   Respiratory:  Positive for cough and wheezing. Negative for shortness of breath.    Cardiovascular: Negative.  Negative for chest pain, palpitations and leg swelling.   Gastrointestinal:  Negative for abdominal pain, nausea and vomiting.   Genitourinary:  Negative for difficulty urinating and dysuria.   Neurological:  Negative for dizziness and light-headedness.   Psychiatric/Behavioral:  Negative for agitation and confusion.         PHYSICAL EXAM:  Physical Exam  Vitals and nursing note reviewed.    Constitutional:       Appearance: She is obese.   Cardiovascular:      Rate and Rhythm: Normal rate and regular rhythm.      Heart sounds: Normal heart sounds.   Pulmonary:      Effort: Pulmonary effort is normal. No respiratory distress.      Breath sounds: Wheezing present.      Comments: 3L NC  Coarse breath sounds and mild expiratory wheezes bilaterally  Abdominal:      General: Abdomen is flat. There is no distension.      Palpations: Abdomen is soft.      Tenderness: There is no abdominal tenderness.      Hernia: A hernia (umbilical) is present.   Musculoskeletal:      Right lower leg: No edema.      Left lower leg: No edema.   Skin:     General: Skin is warm and dry.   Neurological:      General: No focal deficit present.      Mental Status: She is alert and oriented to person, place, and time. Mental status is at baseline.          Reviewed most current lab test results and cultures  YES  Reviewed most current radiology test results   YES  Review and summation of old records today    NO  Reviewed patient's current orders and MAR    YES  PMH/SH reviewed - no change compared to H&P  ________________________________________________________________________  Care Plan discussed with:    Comments   Patient x    Family  RN x    Care Manager     Consultant                        Multidiciplinary team rounds were held today with case manager, nursing, pharmacist and higher education careers adviser.  Patient's plan of care was discussed; medications were reviewed and discharge planning was addressed.     ________________________________________________________________________  Total NON critical care TIME:  35  Minutes    Total CRITICAL CARE TIME Spent:   Minutes non procedure based      Comments   >50% of visit spent in counseling and coordination of care     ________________________________________________________________________  Lum DELENA Neth, PA-C     Procedures: see electronic medical records for all  procedures/Xrays and details which were not copied into this note but were reviewed prior to creation of Plan.      LABS:  I reviewed today's most current labs and imaging studies.  Pertinent labs include:  Recent Labs     06/08/24  0250 06/09/24  0200 06/10/24  0300   WBC 7.0 6.5 7.9   HGB 10.2* 10.1* 10.1*   HCT 34.0* 34.7* 34.9*   PLT 147* 132* 103*     Recent Labs     06/08/24  0250 06/09/24  0200 06/10/24  0300   NA 144 142 141   K 4.1 3.8 3.8   CL 106 102 100   CO2 29 34* 33*   BUN 26* 20 22   MG 2.3 2.0  --    PHOS 2.1* 2.6  --    ALT  --  21 16       Signed: Lum DELENA Neth, PA-C   "

## 2024-06-10 NOTE — Consults (Signed)
 "  IMPRESSION:   Acute hypoxic respiratory failure  Chronic hypoxic respiratory failure  Hypercapnic respiratory failure  Bilateral pneumonia  Cirrhosis of liver  Chronic A-fib  Additional workup outlined below  Pt is at high risk of sudden decline and decompensation with life threatening consequenses and continued end organ dysfunction and failure  Pt is critically ill. Time spent with pt and staff actively rendering care, managing pt and coordinating care as stated below; 55 minutes, exclusive of any procedures      RECOMMENDATIONS/PLAN:   63 year old obese lady came in because of shortness of breath and dyspnea she is chronically on home oxygen she did not have any sleep study done in the past she has history of hypertension chronic A-fib history of EtOH cirrhosis of liver, patient also has COPD she was severely hypoxic and she was put on noninvasive ventilator CAT scan of the chest was done which showed bilateral pneumonia patient was intubated and successfully weaned off and she is using BiPAP at night and oxygen during the daytime  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 CT head was negative  Continue use antibiotic for bilateral pneumonia  Nocturnal CPAP she never had any sleep study done  On 2 L nasal Cannula oxygen as salvage oxygen delivery device to provide high concentration of oxygen to overcome refractory hypoxia;   Continue with Bumex   Overnight pulse oximetry shows desaturation for 24 minutes qualify for home oxygen  Transfuse prn to maintain Hgb > 7  Labs to follow electrolytes, renal function and and blood counts  Bronchial hygiene with respiratory therapy techniques, bronchodilators  Pt needs IV fluids with additives and Drug therapy requiring intensive monitoring for toxicity  Prescription drug management with home med reconciliation reviewed  DVT, SUP prophylaxis     [x]  High complexity decision making was performed  [x]  See my orders for details    PMH:  has a past medical  history of Anxiety, Depression, Gastrointestinal disorder, Heart failure (HCC), Hyperlipidemia, Hypertension, Liver disease, and Pneumonia.    PSH:   has a past surgical history that includes Cataract removal; egd transoral biopsy single/multiple (05/17/2009); Dilation and curettage of uterus (1999); Hysterectomy (2008); Cesarean section (1986 and 1993); Adenoidectomy (1972); Tonsillectomy (1972); back surgery; and Upper gastrointestinal endoscopy (N/A, 04/17/2024).     FHX: family history includes Cancer in her mother; Heart Disease in her father.     SHX:  reports that she quit smoking about 16 years ago. Her smoking use included cigarettes. She has never used smokeless tobacco. She reports that she does not drink alcohol and does not use drugs.    ALL:   Allergies   Allergen Reactions    Latex Rash        MEDS:   [x]  Reviewed - As Below   []  Not reviewed    Current Facility-Administered Medications   Medication Dose Route Frequency Provider Last Rate Last Admin    bumetanide  (BUMEX ) injection 1 mg  1 mg IntraVENous Daily Azeem, Ahad, MD        metoprolol  tartrate (LOPRESSOR ) tablet 50 mg  50 mg Oral BID Azeem, Ahad, MD   50 mg at 06/09/24 0809    OLANZapine  (ZYPREXA ) tablet 10 mg  10 mg Oral Nightly Punyala, Srinivasa R, MD   10 mg at 06/09/24 2047    atorvastatin  (LIPITOR ) tablet 40 mg  40 mg Oral Nightly Azeem, Ahad, MD   40 mg at 06/09/24 2047    haloperidol  lactate (HALDOL ) injection 5  mg  5 mg IntraVENous Q6H PRN Azeem, Ahad, MD   5 mg at 06/08/24 0438    budesonide  (PULMICORT ) nebulizer suspension 500 mcg  0.5 mg Nebulization BID RT Nunna, Krishidhar R, MD   500 mcg at 06/09/24 1926    insulin  glargine (LANTUS ) injection vial 10 Units  10 Units SubCUTAneous Daily Nunna, Krishidhar R, MD   10 Units at 06/09/24 0810    ipratropium 0.5 mg-albuterol  2.5 mg (DUONEB ) nebulizer solution 1 Dose  1 Dose Inhalation Q4H PRN Nunna, Krishidhar R, MD   1 Dose at 06/08/24 0304    insulin  lispro (HUMALOG ,ADMELOG ) injection  vial 0-8 Units  0-8 Units SubCUTAneous Q4H Nunna, Krishidhar R, MD   2 Units at 06/07/24 2042    0.9 % sodium chloride  infusion   IntraVENous PRN Nunna, Krishidhar R, MD        ipratropium 0.5 mg-albuterol  2.5 mg (DUONEB ) nebulizer solution 1 Dose  1 Dose Inhalation Q4H WA RT Nunna, Krishidhar R, MD   1 Dose at 06/09/24 1926    albuterol  (PROVENTIL ) nebulizer solution 2.5 mg  2.5 mg Nebulization Q4H PRN Nunna, Krishidhar R, MD        [Held by provider] tiotropium bromide  (SPIRIVA  RESPIMAT) 2.5 MCG/ACT inhaler 2 puff  2 puff Inhalation Daily RT Rawland Veleta SAUNDERS, MD        [Held by provider] budesonide -formoterol  (SYMBICORT ) 160-4.5 MCG/ACT inhaler 2 puff  2 puff Inhalation BID RT Nunna, Krishidhar R, MD        hydrALAZINE  (APRESOLINE ) injection 10 mg  10 mg IntraVENous Q4H PRN Sahib, Taaliba A, APRN - NP   10 mg at 06/07/24 0202    cefTRIAXone  (ROCEPHIN ) 2,000 mg in sterile water  20 mL IV syringe  2,000 mg IntraVENous Q24H Gillard, Madison A, PA-C   2,000 mg at 06/10/24 0052    glucose chewable tablet 16 g  4 tablet Oral PRN Rawland Veleta SAUNDERS, MD        dextrose  bolus 10% 125 mL  125 mL IntraVENous PRN Nunna, Krishidhar R, MD        Or    dextrose  bolus 10% 250 mL  250 mL IntraVENous PRN Nunna, Krishidhar R, MD        glucagon  injection 1 mg  1 mg SubCUTAneous PRN Nunna, Krishidhar R, MD        dextrose  10 % infusion   IntraVENous Continuous PRN Nunna, Krishidhar R, MD        sertraline  (ZOLOFT ) tablet 100 mg  100 mg Oral Daily Nunna, Krishidhar R, MD   100 mg at 06/09/24 0809    apixaban  (ELIQUIS ) tablet 5 mg  5 mg Per NG tube BID Nunna, Krishidhar R, MD   5 mg at 06/09/24 2048    sodium chloride  flush 0.9 % injection 5-40 mL  5-40 mL IntraVENous 2 times per day Marvis Lynwood HERO, APRN - CNP   10 mL at 06/09/24 2048    sodium chloride  flush 0.9 % injection 5-40 mL  5-40 mL IntraVENous PRN Marvis Lynwood HERO, APRN - CNP        0.9 % sodium chloride  infusion   IntraVENous PRN Marvis Lynwood HERO, APRN - CNP        lidocaine  1 %  injection 50 mg  50 mg IntraDERmal Once Marvis Lynwood HERO, APRN - CNP        ALTEplase  (CATHFLO) injection 2 mg  2 mg IntraCATHeter PRN Marvis Lynwood HERO, APRN - CNP  sodium chloride  flush 0.9 % injection 5-40 mL  5-40 mL IntraVENous 2 times per day Marvis Lynwood HERO, APRN - CNP   10 mL at 06/09/24 2048    sodium chloride  flush 0.9 % injection 5-40 mL  5-40 mL IntraVENous PRN Marvis Lynwood HERO, APRN - CNP        0.9 % sodium chloride  infusion   IntraVENous PRN Marvis Lynwood HERO, APRN - CNP   Stopped at 06/05/24 2344    potassium chloride  20 mEq/50 mL IVPB (Central Line)  20 mEq IntraVENous PRN Marvis Lynwood HERO, APRN - CNP        Or    potassium chloride  10 mEq/100 mL IVPB (Peripheral Line)  10 mEq IntraVENous PRN Marvis Lynwood HERO, APRN - CNP        magnesium  sulfate 2000 mg in 50 mL IVPB premix  2,000 mg IntraVENous PRN Marvis Lynwood HERO, APRN - CNP        ondansetron  (ZOFRAN -ODT) disintegrating tablet 4 mg  4 mg Oral Q8H PRN Marvis Lynwood HERO, APRN - CNP        Or    ondansetron  (ZOFRAN ) injection 4 mg  4 mg IntraVENous Q6H PRN Marvis Lynwood HERO, APRN - CNP   4 mg at 06/07/24 1021    polyethylene glycol (GLYCOLAX ) packet 17 g  17 g Oral Daily PRN Marvis Lynwood HERO, APRN - CNP        acetaminophen  (TYLENOL ) tablet 650 mg  650 mg Oral Q6H PRN Marvis Lynwood HERO, APRN - CNP   650 mg at 06/07/24 2024    Or    acetaminophen  (TYLENOL ) suppository 650 mg  650 mg Rectal Q6H PRN Marvis Lynwood HERO, APRN - CNP            MAR reviewed and pertinent medications noted or modified as needed      MND:Ezmupwzwu items are noted in HPI.      Hemodynamics:    CO:    CI:    CVP:    SVR:   PAP Systolic:    PAP Diastolic:    PVR:    SV02:        Ventilator Settings:      Mode Rate TV Press PEEP FiO2 PIP Min. Vent   (S) CPAP/PS 16 bpm  380 mL    6 30 %  13 cmH2O           Vital Signs: Telemetry:    AFIB Intake/Output:   BP (!) 119/49   Pulse 54   Temp 98.2 F (36.8 C) (Axillary)   Resp 16   Ht 1.575 m (5' 2)   Wt 112.2 kg (247 lb 5.7 oz)   SpO2 97%   BMI 45.23 kg/m      Temp (24hrs), Avg:97.8 F (36.6 C), Min:97.5 F (36.4 C), Max:98.2 F (36.8 C)        O2 Device: PAP (positive airway pressure) O2 Flow Rate (L/min): 3 L/min       Wt Readings from Last 4 Encounters:   06/10/24 112.2 kg (247 lb 5.7 oz)   06/06/24 126.6 kg (279 lb 1.6 oz)   05/03/24 129.7 kg (286 lb)   05/05/24 129.7 kg (286 lb)          Intake/Output Summary (Last 24 hours) at 06/10/2024 0824  Last data filed at 06/09/2024 2048  Gross per 24 hour   Intake 60 ml   Output 1300 ml   Net -1240 ml  Last shift:      No intake/output data recorded.  Last 3 shifts: 10/19 1901 - 10/21 0700  In: 80 [P.O.:50; I.V.:30]  Out: 2300 [Urine:2300]       Physical Exam:     General: Caucasian female; alert awake on oxygen via nasal cannula CPAP at night  HEENT: NCAT, poor dentition, lips and mucosa dry  Eyes: anicteric; conjunctiva clear  Neck: no nodes, trach midline; no accessory MM use.  Chest: no deformity,  Cardiac: IR regular; no murmur;   Lungs: distant breath sounds; bilateral rales  Abd: soft, NT, hypoactive BS  Ext: Bilateral leg edema; no joint swelling; No clubbing  GU: NO foley, clear urine  Neuro: No focal deficit  Psych- no agitation, oriented to person;   Skin: warm, dry, no cyanosis;   Pulses: 1-2+ Bilateral pedal, radial  Capillary: brisk; pale      DATA:  No results found for this or any previous visit.    05/02/24    ECHO (TTE) COMPLETE (PRN CONTRAST/BUBBLE/STRAIN/3D) 05/06/2024 10:52 AM (Final)    Interpretation Summary    Left Ventricle: Normal left ventricular systolic function with a visually estimated EF of 70 - 75%. Left ventricle size is normal. Normal wall thickness. Normal wall motion. Normal diastolic function.    Right Ventricle: Right ventricle is moderately dilated. Normal systolic function.    Right Atrium: Right atrium is mildly dilated.    Pericardium: Moderate (1-2 cm) localized pericardial effusion present around the left ventricle. Pericardial posterior effusion measures 1.0 cm.     Image quality is adequate. Contrast used: Lumason . Technically difficult study, technically difficult study with poor endocardial visualization, technically difficult study due to patient's body habitus and technically difficult study due to patient's heart rhythm.    Signed by: Laquetta Commander, MD on 05/06/2024 10:52 AM       MAR reviewed and pertinent medications noted or modified as needed    MEDS:   Current Facility-Administered Medications   Medication Dose Route Frequency Provider Last Rate Last Admin    bumetanide  (BUMEX ) injection 1 mg  1 mg IntraVENous Daily Azeem, Ahad, MD        metoprolol  tartrate (LOPRESSOR ) tablet 50 mg  50 mg Oral BID Azeem, Ahad, MD   50 mg at 06/09/24 0809    OLANZapine  (ZYPREXA ) tablet 10 mg  10 mg Oral Nightly Punyala, Srinivasa R, MD   10 mg at 06/09/24 2047    atorvastatin  (LIPITOR ) tablet 40 mg  40 mg Oral Nightly Azeem, Ahad, MD   40 mg at 06/09/24 2047    haloperidol  lactate (HALDOL ) injection 5 mg  5 mg IntraVENous Q6H PRN Azeem, Ahad, MD   5 mg at 06/08/24 0438    budesonide  (PULMICORT ) nebulizer suspension 500 mcg  0.5 mg Nebulization BID RT Nunna, Krishidhar R, MD   500 mcg at 06/09/24 1926    insulin  glargine (LANTUS ) injection vial 10 Units  10 Units SubCUTAneous Daily Nunna, Krishidhar R, MD   10 Units at 06/09/24 0810    ipratropium 0.5 mg-albuterol  2.5 mg (DUONEB ) nebulizer solution 1 Dose  1 Dose Inhalation Q4H PRN Nunna, Krishidhar R, MD   1 Dose at 06/08/24 0304    insulin  lispro (HUMALOG ,ADMELOG ) injection vial 0-8 Units  0-8 Units SubCUTAneous Q4H Nunna, Krishidhar R, MD   2 Units at 06/07/24 2042    0.9 % sodium chloride  infusion   IntraVENous PRN Nunna, Krishidhar R, MD        ipratropium 0.5 mg-albuterol  2.5 mg (  DUONEB ) nebulizer solution 1 Dose  1 Dose Inhalation Q4H WA RT Nunna, Krishidhar R, MD   1 Dose at 06/09/24 1926    albuterol  (PROVENTIL ) nebulizer solution 2.5 mg  2.5 mg Nebulization Q4H PRN Nunna, Krishidhar R, MD        [Held by provider]  tiotropium bromide  (SPIRIVA  RESPIMAT) 2.5 MCG/ACT inhaler 2 puff  2 puff Inhalation Daily RT Nunna, Krishidhar R, MD        [Held by provider] budesonide -formoterol  (SYMBICORT ) 160-4.5 MCG/ACT inhaler 2 puff  2 puff Inhalation BID RT Nunna, Krishidhar R, MD        hydrALAZINE  (APRESOLINE ) injection 10 mg  10 mg IntraVENous Q4H PRN Sahib, Taaliba A, APRN - NP   10 mg at 06/07/24 0202    cefTRIAXone  (ROCEPHIN ) 2,000 mg in sterile water  20 mL IV syringe  2,000 mg IntraVENous Q24H Gillard, Madison A, PA-C   2,000 mg at 06/10/24 0052    glucose chewable tablet 16 g  4 tablet Oral PRN Rawland Veleta SAUNDERS, MD        dextrose  bolus 10% 125 mL  125 mL IntraVENous PRN Nunna, Krishidhar R, MD        Or    dextrose  bolus 10% 250 mL  250 mL IntraVENous PRN Nunna, Krishidhar R, MD        glucagon  injection 1 mg  1 mg SubCUTAneous PRN Nunna, Krishidhar R, MD        dextrose  10 % infusion   IntraVENous Continuous PRN Nunna, Krishidhar R, MD        sertraline  (ZOLOFT ) tablet 100 mg  100 mg Oral Daily Nunna, Krishidhar R, MD   100 mg at 06/09/24 0809    apixaban  (ELIQUIS ) tablet 5 mg  5 mg Per NG tube BID Nunna, Krishidhar R, MD   5 mg at 06/09/24 2048    sodium chloride  flush 0.9 % injection 5-40 mL  5-40 mL IntraVENous 2 times per day Marvis Lynwood HERO, APRN - CNP   10 mL at 06/09/24 2048    sodium chloride  flush 0.9 % injection 5-40 mL  5-40 mL IntraVENous PRN Marvis Lynwood HERO, APRN - CNP        0.9 % sodium chloride  infusion   IntraVENous PRN Marvis Lynwood HERO, APRN - CNP        lidocaine  1 % injection 50 mg  50 mg IntraDERmal Once Marvis Lynwood HERO, APRN - CNP        ALTEplase  (CATHFLO) injection 2 mg  2 mg IntraCATHeter PRN Marvis Lynwood HERO, APRN - CNP        sodium chloride  flush 0.9 % injection 5-40 mL  5-40 mL IntraVENous 2 times per day Marvis Lynwood HERO, APRN - CNP   10 mL at 06/09/24 2048    sodium chloride  flush 0.9 % injection 5-40 mL  5-40 mL IntraVENous PRN Marvis Lynwood HERO, APRN - CNP        0.9 % sodium chloride  infusion   IntraVENous PRN  Marvis Lynwood HERO, APRN - CNP   Stopped at 06/05/24 2344    potassium chloride  20 mEq/50 mL IVPB (Central Line)  20 mEq IntraVENous PRN Marvis Lynwood HERO, APRN - CNP        Or    potassium chloride  10 mEq/100 mL IVPB (Peripheral Line)  10 mEq IntraVENous PRN Marvis Lynwood HERO, APRN - CNP        magnesium  sulfate 2000 mg in 50 mL IVPB premix  2,000 mg IntraVENous PRN  Marvis Lynwood HERO, APRN - CNP        ondansetron  (ZOFRAN -ODT) disintegrating tablet 4 mg  4 mg Oral Q8H PRN Marvis Lynwood HERO, APRN - CNP        Or    ondansetron  (ZOFRAN ) injection 4 mg  4 mg IntraVENous Q6H PRN Marvis Lynwood HERO, APRN - CNP   4 mg at 06/07/24 1021    polyethylene glycol (GLYCOLAX ) packet 17 g  17 g Oral Daily PRN Marvis Lynwood HERO, APRN - CNP        acetaminophen  (TYLENOL ) tablet 650 mg  650 mg Oral Q6H PRN Marvis Lynwood HERO, APRN - CNP   650 mg at 06/07/24 2024    Or    acetaminophen  (TYLENOL ) suppository 650 mg  650 mg Rectal Q6H PRN Marvis Lynwood HERO, APRN - CNP            ARTERIAL BLOOD GAS  Recent Labs     06/09/24  1859   PHART 7.40   PCO2ART 60*   PO2ART 81   HCO3ART 37*   BEART 9.7*   FIO2A 32.0   O2SATART 96   OXYHEM 94.3*   CARBOXHGBART 1.1   METHGBART 0.3   TEMP 97.7     Labs:    Recent Labs     06/08/24  0250 06/09/24  0200 06/10/24  0300   WBC 7.0 6.5 7.9   HGB 10.2* 10.1* 10.1*   PLT 147* 132* 103*     Recent Labs     06/08/24  0250 06/09/24  0200 06/10/24  0300   NA 144 142 141   K 4.1 3.8 3.8   CL 106 102 100   CO2 29 34* 33*   GLUCOSE 131* 131* 89   BUN 26* 20 22   CREATININE 0.65 0.66 0.63   CALCIUM  8.5* 8.3* 8.2*   MG 2.3 2.0  --    PHOS 2.1* 2.6  --    BILITOT  --  1.0 0.8   AST  --  33 31   ALT  --  21 16     No results for input(s): CKTOTAL, CKMB, CKMBINDEX, TROPONINI in the last 72 hours.  Lab Results   Component Value Date/Time    PROBNP 11,914 06/03/2024 05:54 PM      Lab Results   Component Value Date/Time    TSH 3.57 03/19/2022 12:25 PM      Results       Procedure Component Value Units Date/Time    Culture, Blood 1 [[id number]]  Collected: 06/09/24 1420    Order Status: Sent Specimen: Blood Updated: 06/09/24 1451    Legionella antigen, urine [[id number]] Collected: 06/04/24 1228    Order Status: Completed Specimen: Urine Updated: 06/06/24 1636     Sample Site Urine        L pneumophila S1 Ag, Ur Negative        Comment: Presumptive negative for L. pneumophila serogroup 1 antigen in urine,  suggesting no recent or current infection. Legionnaires' disease  cannot be ruled out since other serogroups and species may also  cause disease.  Performed At: [facility]  [address] [place], [place] [id number]  Jennette Shorter MD Ey:[phone]         S. pneumonia Antigen, Urine/CSF [[id number]] Collected: 06/04/24 1228    Order Status: Completed Specimen: Urine Updated: 06/06/24 1636     Sample Site Urine        Specimen Urine     S pneumoniae  Ag, CSF Negative        FLUID CULTURE Not indicated.     Organism Id Not indicated.     Please note: Comment        Comment: College [employer] standards require a culture to be  performed on CSF specimens submitted for bacterial antigen testing.  (CAP [id number]) Urine specimens will not be cultured.  Performed At: [facility]  [address] [place], [place] [id number]  Jennette Shorter MD Ey:[phone]         COVID-19 & Influenza Combo [[id number]] Collected: 06/04/24 0845    Order Status: Completed Specimen: Nasopharynx Updated: 06/04/24 0934     SARS-CoV-2, PCR Not Detected        Comment: Not Detected results do not preclude SARS-CoV-2 infection and should not be used as the sole basis for patient management decisions. Results must be combined with clinical observations, patient history, and epidemiological information        Rapid Influenza A By PCR Not Detected        Rapid Influenza B By PCR Not Detected        Comment:    Testing was performed using cobas Liat SARS-CoV-2 and Influenza A/B nucleic acid assay.  This test is a multiplex Real-Time Reverse Transcriptas          Culture, Blood 2 [[id number]]  (Abnormal) Collected: 06/04/24 0300    Order Status: Completed Specimen: Blood Updated: 06/06/24 0836     Special Requests No Special Requests        Culture       Staphylococcus species, coagulase negative growing in 1 of 2 bottles drawn No Site Indicated                  This isolate represents a common contaminant and may or may not be clinically significant. Please notify the laboratory if further work is indicated.            (NOTE) CALLED TO ;CAMBPELL IRBY RN@[email or web address] .AM    Culture, Blood, PCR ID Panel [[id number]]  (Abnormal) Collected: 06/04/24 0300    Order Status: Completed Specimen: Blood Updated: 06/05/24 1622     Accession Number Blood        Enterococcus faecalis by PCR Not detected     Enterococcus faecium by PCR Not detected     Listeria monocytogenes by PCR Not detected     STAPHYLOCOCCUS Detected        Staphylococcus Aureus Not detected     Staphylococcus epidermidis by PCR Detected        Staphylococcus lugdunensis by PCR Not detected     STREPTOCOCCUS Not detected     Streptococcus agalactiae (Group B) Not detected     Strep pneumoniae Not detected     Strep pyogenes,(Grp. A) Not detected     Acinetobacter calcoac baumannii complex by PCR Not detected     Bacteroides fragilis by PCR Not detected     Enterobacteriaceae by PCR Not detected     Enterobacter cloacae complex by PCR Not detected     Escherichia Coli Not detected     Klebsiella aerogenes by PCR Not detected     Klebsiella oxytoca by PCR Not detected     Klebsiella pneumoniae group by PCR Not detected     Proteus by PCR Not detected     Salmonella species by PCR Not detected     Serratia marcescens by PCR Not detected     Haemophilus Influenzae by PCR  Not detected     Neisseria meningitidis by PCR Not detected     Pseudomonas aeruginosa Not detected     Stenotrophomonas maltophilia by PCR Not detected     Candida albicans by PCR Not detected     Candida auris by PCR Not detected     Candida glabrata Not  detected     Candida krusei by PCR Not detected     Candida parapsilosis by PCR Not detected     Candida tropicalis by PCR Not detected     Cryptococcus neoformans/gattii by PCR Not detected     Resistant gene targets          Methicillin Resistance mecA/C  by PCR Detected        [employer] test comment False positive results may rarely occur. Correlate with     Comment: clinical,epidemiologic,  and other laboratory findings  Please see BCID Interpretation Guide in EPIC Links         MRSA by PCR [[id number]] Collected: 06/04/24 0215    Order Status: Completed Specimen: Swab Updated: 06/04/24 0443     MRSA by PCR Not Detected       Culture, C Auris Screen [[id number]] Collected: 06/04/24 0215    Order Status: Completed Specimen: Swab Updated: 06/07/24 1449     Special Requests No Special Requests        Culture No Candida auris present       Culture, Blood 1 [[id number]] Collected: 06/04/24 0124    Order Status: Completed Specimen: Blood Updated: 06/09/24 1451     Special Requests No Special Requests        Culture No growth 5 days       Culture, Urine [[id number]] Collected: 06/03/24 2204    Order Status: Completed Specimen: Urine Updated: 06/05/24 0811     Special Requests --        No Special Requests  Reflexed from [id number]       Culture No Growth (<1000 cfu/mL)              Imaging:  CTA HEAD NECK W CONTRAST   Final Result         1. No acute large vessel arterial occlusion. Mild to moderate atherosclerotic   changes.   2. Pulmonary hypertension.   3. Biapical airspace disease likely infectious/inflammatory.               Electronically signed by Aletha CHRISTELLA Ham      CT BRAIN PERFUSION   Final Result         1. No acute large vessel arterial occlusion. Mild to moderate atherosclerotic   changes.   2. Pulmonary hypertension.   3. Biapical airspace disease likely infectious/inflammatory.               Electronically signed by Aletha CHRISTELLA Ham      CT HEAD WO CONTRAST   Final Result   No acute intracranial findings.          Electronically signed by PAULINE BLANCH      XR CHEST PORTABLE   Final Result   1. No change bilateral pneumonia         Electronically signed by Evalene JINNY Hoit      CT HEAD WO CONTRAST   Final Result      No acute intracranial abnormality.         Electronically signed by Toribio JINNY Musick      CTA CHEST W WO CONTRAST  Final Result   Extensive bilateral pulmonary infiltrates. No evidence of pulmonary embolism.         Electronically signed by DARICE COLON      XR CHEST PORTABLE   Final Result   1. Enlarged cardiac silhouette, worsening airspace disease or pulmonary edema.   No complicating features post intubation            Electronically signed by Susie Brain      XR CHEST PORTABLE   Final Result   Cardiomegaly and mild interstitial pulmonary edema.             Electronically signed by JIMMY HABIB          No results found.     This care involved high complexity decision making which includes independently reviewing the patient's past medical records, current laboratory results, medication profiles that were immediately available to me and actual Xray images at the bedside in order to assess, support vital system function, and to treat this degree of vital organ system failure, and to prevent further life threatening deterioration of the patients condition.     Medical Decision Making Today  Reviewed the flowsheet and previous days notes  Reviewed and summarized records or history from previous days note or discussions with staff, family  Parenteral controlled substances - Reviewed/ Adjusted / Weaned / Started  High Risk Drug therapy requiring intensive monitoring for toxicity: eg steroids, pressors, antibiotics  Review and order of Clinical lab tests  Review and Order of Radiology tests  Review and Order of Medicine tests  Independent visualization of radiologic Images  Reviewed Ventilator / NiPPV  I have personally reviewed the patients ECG / Telemetry  Diagnostic endoscopies with identified risk  factors        I have provided total of 55 minutes of critical care time rendering care exclusive of any procedures. During this entire length of time the patient's condition was unstable, unpredictable and critically ill in the CCU/ ICU. I was immediately available to the patient whose care required several interactions with nursing, multidisciplinary team members leading to multiple interventions with fluid resuscitation and medication adjustments to optimize respiratory support, hemodynamic treatment, medication changes based on repeat labs results, reviews, exams and assessments. The reason for providing this level of medical care was due to a critical illness that impaired one or more vital organ systems, such that there was a high probability of sudden or life threatening deterioration in the patient's condition.   "

## 2024-06-10 NOTE — Progress Notes (Signed)
 Review of Systems  Physical Exam

## 2024-06-10 NOTE — Care Coordination (Signed)
"  Pt lives with her daughter at a [facility] in [place].     PT/OT recommend:  Moderate intensity short-term skilled occupational therapy up to 5x/week   "

## 2024-06-10 NOTE — Consults (Signed)
"  Nutrition Education    Educated on DM diet guidelines/provided pt w/ food insecurity resource list. Obtained typical eating patterns from pt and offered ways to adjust portions/foods to create a more balanced, diabetes friendly meal. Educated pt on how to build a diabetic meal plate, proper portion sizes, and what foods count as 15 grams of carbohydrates (and what types of foods to pair w/ them; fiber/fats/protein). Pt verbalized understanding and was very appreciative of education.   Learners: Patient  Readiness: Acceptance  Method: Explanation and Handout  Response: Verbalizes Understanding  Contact name and number provided.    Aleck Ferrari, RD  Contact Number: [email or web address]    "

## 2024-06-10 NOTE — Progress Notes (Signed)
"  1738: Verbal transfer report given to Randall Likens, RN (oncoming nurse) by Thurman Norris, RN (offgoing nurse). Report included the following information Nurse Handoff Report, Index, ED Encounter Summary, Adult Overview, Intake/Output, MAR, Recent Results, Med Rec Status, Cardiac Rhythm SR, Alarm Parameters, Quality Measures, and Neuro Assessment.     1835: Pt arrived in stable condition with all pt belongings in no acute distress. Pt has no questions.     1920: Bedside and Verbal shift change report given to Acord, Jessica Lynette, RN  (oncoming nurse) by Randall Likens, RN (offgoing nurse). Report included the following information Nurse Handoff Report, Index, ED Encounter Summary, Adult Overview, Intake/Output, MAR, Recent Results, Med Rec Status, Cardiac Rhythm SR, Alarm Parameters, Quality Measures, and Neuro Assessment.         "

## 2024-06-10 NOTE — Progress Notes (Signed)
"  Spoke with pt and nsg at bedside. Pt up in chair, alert and interacting appropriately. Nsg reports pt is tolerating diet well without concerns for aspiration. Will cont to follow.   "

## 2024-06-10 NOTE — Plan of Care (Signed)
 "         PHYSICAL THERAPY TREATMENT     Patient: Breanna Lester (63 y.o. female)  Date: 06/10/2024  Diagnosis: Acute respiratory failure, unspecified whether with hypoxia or hypercapnia (HCC) [J96.00]  Acute on chronic congestive heart failure, unspecified heart failure type (HCC) [I50.9]  Acute hypoxic respiratory failure (HCC) [J96.01] Acute respiratory failure (HCC)      Precautions: General Precautions, Fall Risk, Bed Alarm                      Recommendations for nursing mobility: Out of bed to chair for meals, Encourage HEP in prep for ADLs/mobility; see handout for details, Use of bed/chair alarm for safety, LE elevation for management of edema, Use of BSC for toileting , AD and gt belt for bed to chair , and Assist x2    In place during session: Peripheral IV, Nasal Cannula 3L, External Catheter, EKG/telemetry , and Pulse ox  Chart, physical therapy assessment, plan of care and goals were reviewed.  ASSESSMENT  Patient continues with skilled PT services and is progressing towards goals. Pt semi-supine upon PT arrival, agreeable to session. Pt A&O x 4. (See below for objective details and assist levels).     Overall pt tolerated session well today with 2/10 pain in back.  Pt demos improvement in functional mobility today requiring min A x 2.  Pt denies any dizziness or lightheadedness with sitting or standing today.  Pt continues to require max A x 2 for sit to stand transfer with use of a rocking motion and the count of 3 in order to stand.  Once standing, pt required a minute to steady herself due to slight posterior lean when first standing.  Pt then able to progress gait distance today to 20 ft with rollator and CG-min A x 2 for safety and line management.  Pt demos slow cadence and decreased step length and wide BOS requiring cues for PLB technique due to SOB with O2 sats dropping to 87% with activity.  With rest, O2 sats rose to 94%.  Pt assisted to bedside recliner and set up with all needs met.   Will  continue to benefit from skilled PT services, and will continue to progress as tolerated. Current PT DC recommendation Moderate intensity short-term skilled physical therapy up to 5x/week once medically appropriate.     Start of Session End of Session   SPO2 (%) 100 94   Heart Rate (BPM) 60 60     GOALS:    Problem: Physical Therapy - Adult  Goal: By Discharge: Performs mobility at highest level of function for planned discharge setting.  See evaluation for individualized goals.  Description: FUNCTIONAL STATUS PRIOR TO ADMISSION: Pt required assistance with ADLs and IADLs    HOME SUPPORT: The patient lived with daughter to provide assistance but is not available to 24/7 for assistance    Physical Therapy Goals  Initiated 06/09/2024  Pt stated goal: to go home  Pt will be I with LE HEP in 7 days.  Pt will perform bed mobility with CGA in 7 days.  Pt will perform transfers with CGA in 7 days.   Pt will amb 100 feet with LRAD safely with Contact Guard Assist in 7 days.  Pt will demonstrate improvement in standing balance from Moderate Assist x2 to CGA in 7 days.      Outcome: Progressing          PLAN :  Patient  continues to benefit from skilled intervention to address functional impairments. Continue treatment per established plan of care to address goals.    Recommendation for discharge: (in order for the patient to meet his/her long term goals)  Moderate intensity short-term skilled physical therapy up to 5x/week    Potential barriers for safe discharge: pt is not safe to be alone and concern for pt safely navigating or managing the home environment.    IF patient discharges home will need the following IFZ:wnwz     SUBJECTIVE:   Patient stated I would like to sit in the chair.    OBJECTIVE DATA SUMMARY:   Critical Behavior:  Orientation  Orientation Level: Oriented X4  Cognition  Overall Cognitive Status: Exceptions  Arousal/Alertness: Appears intact  Following Commands: Appears intact  Attention Span: Appears  intact  Memory: Appears intact  Safety Judgement: Decreased awareness of need for safety  Problem Solving: Assistance required to implement solutions;Assistance required to identify errors made;Assistance required to generate solutions;Assistance required to correct errors made  Insights: Fully aware of deficits  Initiation: Requires cues for some  Sequencing: Requires cues for some    Functional Mobility Training:  Bed Mobility:  Bed Mobility Training  Bed Mobility Training: Yes  Interventions: Verbal cues;Safety awareness training  Supine to Sit: Minimal assistance (increase time and A with trunk)  Scooting: Minimal assistance  Transfers:  Transfer Training  Transfer Training: Yes  Interventions: Safety awareness training;Verbal cues  Sit to Stand: Substantial/Maximal assistance;2 Person assistance  Bed to Chair: Minimal assistance;2 Person assistance  Balance:  Balance  Sitting: Intact  Standing: Impaired  Standing - Static: Constant support;Fair  Standing - Dynamic: Constant support;Fair  Wheelchair Mobility:     Ambulation/Gait Training:                                Soil Scientist: Yes  Overall Level of Assistance: Contact guard assistance;Minimal assistance;2 Person assistance  Distance (ft): 20 Feet  Assistive Device: Gait belt;Walker, rolling  Interventions: Verbal cues;Safety awareness training  Base of Support: Widened  Speed/Cadence: Slow;Shuffled  Step Length: Right shortened;Left shortened  Gait Abnormalities: Decreased step clearance                        Pain Rating:  2/10 back reported  Pain Intervention(s):   repositioning    Activity Tolerance:   Good, requires rest breaks, and observed shortness of breath on exertion    After treatment patient left in no apparent distress:   Bed locked and returned to lowest position, Patient left in no apparent distress sitting up in chair, Call bell within reach, and Bed/ chair alarm activated, and nsg updated     COMMUNICATION/COLLABORATION:   The  patients plan of care was discussed with: Registered nurse    Patient Education  Education Given To: Patient  Education Provided: Role of Therapy;Plan of Care;Transfer Training;Energy Conservation;Equipment  Education Method: Demonstration;Verbal  Barriers to Learning: None  Education Outcome: Verbalized understanding;Continued education needed        Jon Balloon, PT  Minutes: 31           "

## 2024-06-11 LAB — POCT GLUCOSE
POC Glucose: 143 mg/dL — ABNORMAL HIGH (ref 65–100)
POC Glucose: 85 mg/dL (ref 65–100)
POC Glucose: 93 mg/dL (ref 65–100)

## 2024-06-11 LAB — BLOOD GAS, ARTERIAL: pH, Arterial: 7.4 (ref 7.35–7.45)

## 2024-06-11 LAB — PROCALCITONIN: Procalcitonin: 0.1 ng/mL

## 2024-06-11 LAB — CBC
Hemoglobin: 11.2 g/dL — ABNORMAL LOW (ref 11.5–16.0)
MCH: 25.1 pg — ABNORMAL LOW (ref 26.0–34.0)
MCHC: 29.2 g/dL — ABNORMAL LOW (ref 30.0–36.5)
Platelets: 146 K/uL — ABNORMAL LOW (ref 150–400)
RDW: 17 % — ABNORMAL HIGH (ref 11.5–14.5)

## 2024-06-11 LAB — BASIC METABOLIC PANEL
BUN/Creatinine Ratio: 29 — ABNORMAL HIGH (ref 12–20)
BUN: 20 mg/dL (ref 8–23)
Calcium: 8.9 mg/dL (ref 8.8–10.2)
Creatinine: 0.68 mg/dL (ref 0.60–1.00)
Glucose: 114 mg/dL — ABNORMAL HIGH (ref 65–100)
Sodium: 142 mmol/L (ref 136–145)

## 2024-06-11 LAB — FOLATE: Folate: 12.7 ng/mL (ref 4.8–24.2)

## 2024-06-11 MED FILL — BUDESONIDE 0.5 MG/2ML IN SUSP: 0.5 MG/2ML | RESPIRATORY_TRACT | Qty: 2

## 2024-06-11 MED FILL — METOPROLOL TARTRATE 50 MG PO TABS: 50 mg | ORAL | Qty: 1

## 2024-06-11 MED FILL — LANTUS 100 UNIT/ML SC SOLN: 100 [IU]/mL | SUBCUTANEOUS | Qty: 10

## 2024-06-11 MED FILL — CEFTRIAXONE SODIUM 2 G IJ SOLR: 2 g | INTRAMUSCULAR | Qty: 2000

## 2024-06-11 MED FILL — DOXYCYCLINE HYCLATE 100 MG IV SOLR: 100 mg | INTRAVENOUS | Qty: 100

## 2024-06-11 MED FILL — MUCUS RELIEF ER 600 MG PO TB12: 600 mg | ORAL | Qty: 1

## 2024-06-11 MED FILL — OLANZAPINE 10 MG PO TABS: 10 mg | ORAL | Qty: 1

## 2024-06-11 MED FILL — ELIQUIS 5 MG PO TABS: 5 mg | ORAL | Qty: 1

## 2024-06-11 MED FILL — SERTRALINE HCL 50 MG PO TABS: 50 mg | ORAL | Qty: 2

## 2024-06-11 MED FILL — ATORVASTATIN CALCIUM 40 MG PO TABS: 40 mg | ORAL | Qty: 1

## 2024-06-11 MED FILL — IPRATROPIUM-ALBUTEROL 0.5-2.5 (3) MG/3ML IN SOLN: 0.5-2.5 (3) MG/3ML | RESPIRATORY_TRACT | Qty: 3

## 2024-06-11 MED FILL — TYLENOL 325 MG PO TABS: 325 mg | ORAL | Qty: 2

## 2024-06-11 MED FILL — BUMETANIDE 0.25 MG/ML IJ SOLN: 0.25 mg/mL | INTRAMUSCULAR | Qty: 4

## 2024-06-11 NOTE — Plan of Care (Signed)
 Patient continues to work towards goals.

## 2024-06-11 NOTE — Plan of Care (Signed)
 "OCCUPATIONAL THERAPY TREATMENT  Patient: Breanna Lester (63 y.o. female)  Date: 06/11/2024  Primary Diagnosis: Acute respiratory failure, unspecified whether with hypoxia or hypercapnia (HCC) [J96.00]  Acute on chronic congestive heart failure, unspecified heart failure type (HCC) [I50.9]  Acute hypoxic respiratory failure (HCC) [J96.01]  Precautions: General Precautions, Fall Risk, Bed Alarm  Recommendations for nursing mobility: Out of bed to chair for meals, Encourage HEP in prep for ADLs/mobility; see handout for details, Frequent repositioning to prevent skin breakdown, Use of bed/chair alarm for safety, LE elevation for management of edema, Use of BSC for toileting , AD and gt belt for bed to chair , Assist x2, and Camie Ip w/ Pt fatigue for safety w/ transfers    In place during session: Nasal Cannula 3L and External Catheter  Chart, occupational therapy assessment, plan of care, and goals were reviewed.  ASSESSMENT  Patient continues with skilled OT services and is progressing towards goals. RN consulted at session initiation on Pt status and cleared for therapeutic participation. Pt presented sitting up in chair upon COTA arrival, agreeable to session after sitting up in chair for lunch ~40 minutes w/ Pt having transferred w/ PT in last session (see PT note for details.) Pt A&O x 4.     Pt requires setup of food items on tray table within reach to self feed/drink. Pt attempted STS to RW 2x w/ Total A x1, however, unable to clear buttox from chair or engage BLEs. Camie stedy brought to Pt and STS attempted w/ Total A x1 a third time w/ Pt unable to clear buttox from chair again. RN aided in fourth STS attempted w/ Max A x2 to sara stedy w/ Pt able to successfully clear bottom for paddle placement. Pt transferred from chair to EOB w/ Total A via sara stedy w/ Max A for safe and controlled descent onto EOB. Pt required Mod A to lower trunk into bed center and bring BLEs into bed. Pt required Mod A x1 via chux  w/ Bil UE + BLE engagement w/ voice cues and bed positioned in trendelenburg to boost to head of bed. Pt required Total A to complete anterior peri care while logrolling to each side in bed w/ Min A + verbal cues + increased time. Pt provided skilled education, endorsing good understanding/follow through (see below for increased edu detail.)  Patient left positioned comfortably semi supine w/ all known needs met or within reach. RN updated at session end on Pt therapeutic status and participation.       (See below for objective details and assist levels).     Overall, Pt presented w/ fair session tolerance this day. Pt, however, continues w/ deficits in strength, endurance, and safety awareness impacting functional capacity to safely and Ind complete ADLS/IADLS/Functional Mobility at this time. Current OT recommendations for discharge Moderate intensity short-term skilled occupational therapy up to 5x/week. Will continue to benefit from skilled OT services, and will continue to progress as tolerated.     GOALS:  Problem: Occupational Therapy - Adult  Goal: By Discharge: Performs self-care activities at highest level of function for planned discharge setting.  See evaluation for individualized goals.  Description: FUNCTIONAL STATUS PRIOR TO ADMISSION:  Pt required assistance with ADLs and IADLs    HOME SUPPORT: The patient lived with daughter to provide assistance.    Occupational Therapy Goals:  Initiated 06/09/2024  Patient/Family stated goal: Unstated  1.  Patient will perform grooming with Independence within 7 day(s).  2.  Patient  will perform upper body dressing with Independence within 7 day(s).  3.  Patient will perform lower body dressing with Minimal Assist within 7 day(s).  4.  Patient will perform toilet transfers with Moderate Assist  within 7 day(s).  5.  Patient will perform all aspects of toileting with Moderate Assist within 7 day(s).  6.  Patient will participate in upper extremity therapeutic  exercise/activities with Independence within 7 day(s).  Outcome: Progressing        PLAN :  Patient continues to benefit from skilled intervention to address functional impairments. Continue treatment per established plan of care to address goals.    Recommend next OT session: Toileting, UB dressing, LB dressing, UB bathing, LB bathing, and Seated grooming    Recommendation for discharge: (in order for the patient to meet his/her long term goals): Moderate intensity short-term skilled occupational therapy up to 5x/week    Potential barriers for safe discharge: pts current support system is unable to meet their requirements for physical assistance, pt has poor safety awareness, pt has impaired cognition, pt is a high fall risk, pt is not safe to be alone, and concern for pt safely navigating or managing the home environment.     IF patient discharges home will need the following DME: continuing to assess with progress     SUBJECTIVE:   Patient stated Oh no, my legs won't work.    OBJECTIVE DATA SUMMARY:   Cognitive/Behavioral Status:  Orientation  Overall Orientation Status: Within Functional Limits  Orientation Level: Oriented X4  Cognition  Overall Cognitive Status: Exceptions  Arousal/Alertness: Appears intact  Following Commands: Appears intact  Attention Span: Appears intact  Memory: Appears intact  Safety Judgement: Decreased awareness of need for safety  Problem Solving: Assistance required to implement solutions;Assistance required to identify errors made;Assistance required to generate solutions;Assistance required to correct errors made  Insights: Fully aware of deficits  Initiation: Requires cues for some  Sequencing: Requires cues for some    Functional Mobility and Transfers for ADLs:  Bed Mobility:  Bed Mobility Training  Bed Mobility Training: Yes  Overall Level of Assistance: Partial/Moderate assistance  Interventions: Verbal cues;Safety awareness training;Visual cues;Tactile cues;Manual  cues  Rolling: Minimal assistance  Sit to Supine: Partial/Moderate assistance  Scooting: Stand by assistance    Transfers:  Art Therapist: Yes  Overall Level of Assistance: Substantial/Maximal assistance;2 Person assistance  Interventions: Manual cues;Safety awareness training;Verbal cues;Tactile cues  Sit to Stand: Substantial/Maximal assistance;2 Person assistance;Partial/Moderate assistance  Stand to Sit: Substantial/Maximal assistance  Bed to Chair: Dependent/Total (Pt fatigued from sitting up in chair ~40 minutes and required Camie Ip w/ A x2 STS)    Balance:  Balance  Sitting: Intact  Standing: Impaired  Standing - Static: Constant support;Poor  Standing - Dynamic: Not tested    ADL Intervention:    Feeding: Setup  Feeding Skilled Clinical Factors: Pt requires setup of food items on tray table within reach to self feed/drink.    Toileting: Dependent/Total  Toileting Skilled Clinical Factors: Pt required Total A to complete anterior peri care while    Functional Mobility: Minimal assistance;Increased time to complete  Functional Mobility Skilled Clinical Factors: logrolling to each side in bed w/ Min A + verbal cues + increased time.    Therapeutic Intervention provided:   energy conservation, functional mobility/transfers in preparation for completion of ADLs/IADLs, peri-care, and feeding    Pain Rating:  0/10     Activity Tolerance:   Fair  and requires rest  breaks    After treatment patient left in no apparent distress:   Bed locked and returned to lowest position, Patient left in no apparent distress in bed, Call bell within reach, Bed/ chair alarm activated, Side rails x3, and Updated patient's board on functional status and mobility recommendations, and nsg updated     COMMUNICATION/EDUCATION:   The patients plan of care was discussed with: Physical therapist and Registered nurse    Patient Education  Education Given To: Patient  Education Provided: Plan of Engineer, Manufacturing;Fall Prevention Strategies;Energy Conservation;Mobility Training;ADL Adaptive Strategies;Equipment;Role of Therapy;IADL Safety  Education Method: Verbal;Demonstration  Barriers to Learning: None  Education Outcome: Continued education needed;Verbalized understanding;Demonstrated understanding    Pt given skilled edu on energy conservation techniques, safety during functional mobility, PLB throughout exertion, and the importance of regular positional changes to protect skin integrity + prevent the development of orthostatic symptoms w/ Pt endorsing good understanding/demonstrating good follow through at this time.     Thank you for this referral.  Leeroy Piety, OTA  Minutes: 47   "

## 2024-06-11 NOTE — Progress Notes (Addendum)
 "Pulmonary and Critical Care progress note    Subjective:   Consult Note: 06/11/2024 @no  control      Chief Complaint:   Chief Complaint   Patient presents with    Chest Pain        This patient has been seen and evaluated at the request of Dr. Phill    63 year old obese Caucasian lady  I am asked to see for acute respiratory failure with hypoxia and hypercapnia    Patient has a past medical history significant for chronic hypoxic respiratory failure on 3 L nasal cannula secondary to COPD, obstructive sleep apnea on CPAP, anxiety, depression, HTN, A-fib on Eliquis , T2DM, pancreatitis and EtOH cirrhosis     Presented to ED on 06/03/2024 for worsening dyspnea.  In ED, patient severely hypoxic with increased work of breathing which was refractory to BiPAP, requiring intubation.  CTA chest shows extensive bilateral pulmonary infiltrates concerning for severe bilateral pneumonia, however negative for PE.  Started on empiric IV ceftriaxone  and azithromycin .  Respiratory status improved and patient was able to be extubated to BiPAP and further decreased to home dose 3 L NC.  However, patient remained confused so psychiatry was consulted.  Started on Zoloft  and Zyprexa .  Stroke alert was called on 10/17 for left-sided weakness with complete left hemianopia.  CT head, CTA head/neck and CT brain perfusion with no acute findings.  Patient unable to tolerate MRI.  Neurology evaluated, consider EEG if clinical status worsening otherwise no further stroke work up.      Transferred to medical service on 10/20.  I was asked to see the patient to take over her pulmonary management by intensivist Dr. Azeem.    ABGs 06/09/2024 showed:  pH of 7.40, pCO2 60, pO2 81, bicarb 37, saturation 96% on BiPAP with 32% FiO2    Pt with increased work of breathing and lethargy on 10/21 with repeat ABG showing worsening hypercapnia of 60.  Started on nightly BiPAP with significant improvement in breathing and normalization of mental status.      Echocardiogram 05/05/2024 reviewed personally showing:  LVEF 70 to 75%  Dilated RA and RV    Last chest x-ray 06/05/24 reviewed personally showing:  Patient was still intubated at that time  Bilateral infiltrates    Currently on nasal cannula oxygen    06/11/2024:    Patient seen and examined  Overnight events noted    Sitting up in the bed comfortably  Having breakfast  On 3 L nasal cannula oxygen  Improved respiratory status  Much more awake and alert and interactive and answering questions appropriately  Has used BiPAP 16/8, 30% FiO2 overnight  ABGs reviewed showing pH of 7.40, pCO2 57, pO2 78, bicarb 34, saturation 95%    Review of Systems:  Pertinent items are noted in HPI.    Past Medical History:   Diagnosis Date    Anxiety     Depression     Gastrointestinal disorder     Heart failure (HCC)     Hyperlipidemia     Hypertension     Liver disease 2009    liver failure    Pneumonia      Past Surgical History:   Procedure Laterality Date    ADENOIDECTOMY  1972    BACK SURGERY      cement discs following a fall    CATARACT REMOVAL      CESAREAN SECTION  1986 and 1993    DILATION AND CURETTAGE OF UTERUS  1999  EGD TRANSORAL BIOPSY SINGLE/MULTIPLE  05/17/2009         HYSTERECTOMY (CERVIX STATUS UNKNOWN)  2008    TONSILLECTOMY  1972    UPPER GASTROINTESTINAL ENDOSCOPY N/A 04/17/2024    ESOPHAGOGASTRODUODENOSCOPY performed by Madlyn Fendt, MD at [facility] ENDOSCOPY      Family History   Problem Relation Age of Onset    Cancer Mother     Heart Disease Father      Social History     Tobacco Use    Smoking status: Former     Current packs/day: 0.00     Types: Cigarettes     Quit date: 02/28/2008     Years since quitting: 16.2    Smokeless tobacco: Never   Substance Use Topics    Alcohol use: Never      Current Facility-Administered Medications   Medication Dose Route Frequency Provider Last Rate Last Admin    guaiFENesin  (MUCINEX ) extended release tablet 600 mg  600 mg Oral BID Gillard, Madison A, PA-C   600 mg at 06/11/24  9144    benzonatate  (TESSALON ) capsule 100 mg  100 mg Oral TID PRN Ronn Dixon A, PA-C   100 mg at 06/10/24 1217    doxycycline  (VIBRAMYCIN ) 100 mg in sodium chloride  0.9 % 100 mL IVPB (addEASE)  100 mg IntraVENous BID Shaniya Tashiro A, MD 100 mL/hr at 06/11/24 0859 100 mg at 06/11/24 0859    ipratropium 0.5 mg-albuterol  2.5 mg (DUONEB ) nebulizer solution 1 Dose  1 Dose Inhalation Q6H WA RT Allauddin, Tahir, MD   1 Dose at 06/11/24 0847    influenza tiss-cult vaccine (FLUCELVAX) injection 0.5 mL  1 Dose IntraMUSCular Prior to discharge Aimee Elspeth SAUNDERS, MD        bumetanide  (BUMEX ) injection 1 mg  1 mg IntraVENous Daily Azeem, Ahad, MD   1 mg at 06/10/24 0826    metoprolol  tartrate (LOPRESSOR ) tablet 50 mg  50 mg Oral BID Azeem, Ahad, MD   50 mg at 06/11/24 0856    OLANZapine  (ZYPREXA ) tablet 10 mg  10 mg Oral Nightly Punyala, Srinivasa R, MD   10 mg at 06/11/24 0238    atorvastatin  (LIPITOR ) tablet 40 mg  40 mg Oral Nightly Azeem, Ahad, MD   40 mg at 06/10/24 2246    haloperidol  lactate (HALDOL ) injection 5 mg  5 mg IntraVENous Q6H PRN Azeem, Ahad, MD   5 mg at 06/08/24 0438    budesonide  (PULMICORT ) nebulizer suspension 500 mcg  0.5 mg Nebulization BID RT Nunna, Krishidhar R, MD   500 mcg at 06/11/24 0847    insulin  glargine (LANTUS ) injection vial 10 Units  10 Units SubCUTAneous Daily Nunna, Krishidhar R, MD   10 Units at 06/11/24 0856    ipratropium 0.5 mg-albuterol  2.5 mg (DUONEB ) nebulizer solution 1 Dose  1 Dose Inhalation Q4H PRN Nunna, Krishidhar R, MD   1 Dose at 06/08/24 0304    insulin  lispro (HUMALOG ,ADMELOG ) injection vial 0-8 Units  0-8 Units SubCUTAneous Q4H Nunna, Krishidhar R, MD   2 Units at 06/07/24 2042    0.9 % sodium chloride  infusion   IntraVENous PRN Nunna, Krishidhar R, MD        albuterol  (PROVENTIL ) nebulizer solution 2.5 mg  2.5 mg Nebulization Q4H PRN Nunna, Krishidhar R, MD        [Held by provider] tiotropium bromide  (SPIRIVA  RESPIMAT) 2.5 MCG/ACT inhaler 2 puff  2 puff Inhalation  Daily RT Rawland Veleta SAUNDERS, MD        [  Held by provider] budesonide -formoterol  (SYMBICORT ) 160-4.5 MCG/ACT inhaler 2 puff  2 puff Inhalation BID RT Nunna, Krishidhar R, MD        hydrALAZINE  (APRESOLINE ) injection 10 mg  10 mg IntraVENous Q4H PRN Sahib, Taaliba A, APRN - NP   10 mg at 06/07/24 0202    cefTRIAXone  (ROCEPHIN ) 2,000 mg in sterile water  20 mL IV syringe  2,000 mg IntraVENous Q24H Gillard, Madison A, PA-C   2,000 mg at 06/11/24 0222    glucose chewable tablet 16 g  4 tablet Oral PRN Rawland Veleta SAUNDERS, MD        dextrose  bolus 10% 125 mL  125 mL IntraVENous PRN Nunna, Krishidhar R, MD        Or    dextrose  bolus 10% 250 mL  250 mL IntraVENous PRN Nunna, Krishidhar R, MD        glucagon  injection 1 mg  1 mg SubCUTAneous PRN Nunna, Krishidhar R, MD        dextrose  10 % infusion   IntraVENous Continuous PRN Nunna, Krishidhar R, MD        sertraline  (ZOLOFT ) tablet 100 mg  100 mg Oral Daily Nunna, Krishidhar R, MD   100 mg at 06/11/24 0855    apixaban  (ELIQUIS ) tablet 5 mg  5 mg Per NG tube BID Nunna, Krishidhar R, MD   5 mg at 06/11/24 0855    sodium chloride  flush 0.9 % injection 5-40 mL  5-40 mL IntraVENous 2 times per day Marvis Lynwood HERO, APRN - CNP   10 mL at 06/10/24 2310    sodium chloride  flush 0.9 % injection 5-40 mL  5-40 mL IntraVENous PRN Marvis Lynwood HERO, APRN - CNP        0.9 % sodium chloride  infusion   IntraVENous PRN Marvis Lynwood HERO, APRN - CNP        lidocaine  1 % injection 50 mg  50 mg IntraDERmal Once Marvis Lynwood HERO, APRN - CNP        ALTEplase  (CATHFLO) injection 2 mg  2 mg IntraCATHeter PRN Marvis Lynwood HERO, APRN - CNP        sodium chloride  flush 0.9 % injection 5-40 mL  5-40 mL IntraVENous PRN Marvis Lynwood HERO, APRN - CNP        0.9 % sodium chloride  infusion   IntraVENous PRN Marvis Lynwood HERO, APRN - CNP   Stopped at 06/05/24 2344    potassium chloride  20 mEq/50 mL IVPB (Central Line)  20 mEq IntraVENous PRN Marvis Lynwood HERO, APRN - CNP        Or    potassium chloride  10 mEq/100 mL IVPB (Peripheral  Line)  10 mEq IntraVENous PRN Marvis Lynwood HERO, APRN - CNP        magnesium  sulfate 2000 mg in 50 mL IVPB premix  2,000 mg IntraVENous PRN Marvis Lynwood HERO, APRN - CNP        ondansetron  (ZOFRAN -ODT) disintegrating tablet 4 mg  4 mg Oral Q8H PRN Marvis Lynwood HERO, APRN - CNP        Or    ondansetron  (ZOFRAN ) injection 4 mg  4 mg IntraVENous Q6H PRN Marvis Lynwood HERO, APRN - CNP   4 mg at 06/07/24 1021    polyethylene glycol (GLYCOLAX ) packet 17 g  17 g Oral Daily PRN Marvis Lynwood HERO, APRN - CNP        acetaminophen  (TYLENOL ) tablet 650 mg  650 mg Oral Q6H PRN Marvis Lynwood HERO, APRN - CNP  650 mg at 06/10/24 1508    Or    acetaminophen  (TYLENOL ) suppository 650 mg  650 mg Rectal Q6H PRN Marvis Lynwood HERO, APRN - CNP              Allergies   Allergen Reactions    Latex Rash           Objective:     Blood pressure 132/60, pulse 68, temperature 98.1 F (36.7 C), temperature source Oral, resp. rate 18, height 1.575 m (5' 2), weight 112.2 kg (247 lb 5.7 oz), SpO2 95%. Temp (24hrs), Avg:98.2 F (36.8 C), Min:97.7 F (36.5 C), Max:98.8 F (37.1 C)      Intake and Output:  Current Shift: No intake/output data recorded.  Last 3 Shifts: 10/20 1901 - 10/22 0700  In: 156.9 [P.O.:50; I.V.:10]  Out: 1300 [Urine:1300]  @INTAKEOUTPUTBRIEF @     Physical Exam:     General: Lying in bed comfortably, mild respiratory distress.  On nasal cannula oxygen.  Obese  Eye: Reactive, symmetric  Throat and Neck: Supple  Lung: Reduced air entry bilaterally with prolonged exhalation.  Occasional wheezing.  Bilateral crackles   Heart: S1+S2.  No murmurs  Abdomen: soft, non-tender. Bowel sounds normal. No masses; obese  Extremities: No edema  GU: Not done  Skin: No cyanosis  Neurologic: A & O x3.  Grossly nonfocal  Psychiatric: Mildly anxious      Lab/Data Review:      Recent Results (from the past 24 hours)   POCT Glucose    Collection Time: 06/10/24 12:49 PM   Result Value Ref Range    POC Glucose 120 (H) 65 - 100 mg/dL    Performed by: Thurman Norris    POCT  Glucose    Collection Time: 06/10/24  4:22 PM   Result Value Ref Range    POC Glucose 116 (H) 65 - 100 mg/dL    Performed by: Christobal Cathryne Pool    POCT Glucose    Collection Time: 06/10/24  8:22 PM   Result Value Ref Range    POC Glucose 143 (H) 65 - 100 mg/dL    Performed by: Ezzard Colas    POCT Glucose    Collection Time: 06/11/24  2:18 AM   Result Value Ref Range    POC Glucose 93 65 - 100 mg/dL    Performed by: Ezzard Colas    Blood Gas, Arterial    Collection Time: 06/11/24  4:11 AM   Result Value Ref Range    pH, Arterial 7.40 7.35 - 7.45      pCO2, Arterial 57 (H) 35 - 45 mmHg    pO2, Arterial 78 (L) 80 - 100 mmHg    O2 Sat, Arterial 95 95 - 99 %    HCO3, Arterial 34 (H) 22 - 26 mmol/L    Base Excess, Arterial 8.0 (H) 0 - 3 mmol/L    O2 Method Nasal Cannula      O2 Flow Rate 3.00 L/min    FIO2 Arterial 32 %    Source Arterial      Site Right Radial      Allen Test YES      Carboxyhgb, Arterial 0.9 (L) 1 - 2 %    Methemoglobin, Arterial 0.4 0 - 1.4 %    Oxyhemoglobin 93.4 (L) 95 - 99 %    Performed by: Mac Kipper     Temperature 98.4     POCT Glucose    Collection Time: 06/11/24  6:26  AM   Result Value Ref Range    POC Glucose 85 65 - 100 mg/dL    Performed by: Ezzard Colas        XR CHEST PORTABLE   Final Result      Persistent but improved diffuse bilateral pulmonary infiltrates.         Electronically signed by DARICE COLON      CTA HEAD NECK W CONTRAST   Final Result         1. No acute large vessel arterial occlusion. Mild to moderate atherosclerotic   changes.   2. Pulmonary hypertension.   3. Biapical airspace disease likely infectious/inflammatory.               Electronically signed by Aletha CHRISTELLA Ham      CT BRAIN PERFUSION   Final Result         1. No acute large vessel arterial occlusion. Mild to moderate atherosclerotic   changes.   2. Pulmonary hypertension.   3. Biapical airspace disease likely infectious/inflammatory.               Electronically signed by Aletha CHRISTELLA Ham      CT HEAD WO  CONTRAST   Final Result   No acute intracranial findings.         Electronically signed by PAULINE BLANCH      XR CHEST PORTABLE   Final Result   1. No change bilateral pneumonia         Electronically signed by Evalene JINNY Hoit      CT HEAD WO CONTRAST   Final Result      No acute intracranial abnormality.         Electronically signed by Toribio JINNY Musick      CTA CHEST W WO CONTRAST   Final Result   Extensive bilateral pulmonary infiltrates. No evidence of pulmonary embolism.         Electronically signed by DARICE COLON      XR CHEST PORTABLE   Final Result   1. Enlarged cardiac silhouette, worsening airspace disease or pulmonary edema.   No complicating features post intubation            Electronically signed by Susie Brain      XR CHEST PORTABLE   Final Result   Cardiomegaly and mild interstitial pulmonary edema.             Electronically signed by JIMMY HABIB            Assessment:     1.  Acute on chronic respiratory failure with hypercapnia  2.  Acute on chronic respiratory failure with hypoxia  3.  Bilateral pneumonia  4.  Acute metabolic encephalopathy  5.  Acute exacerbation of COPD  6.  Obstructive sleep apnea  7.  Liver cirrhosis  8.  Chronic atrial fibrillation  9.  Obesity  10.  Polysubstance abuse  11.  Anemia and thrombocytopenia    Plan:     Patient seen on the medical floor  Being watched here closely    On BiPAP 16/8, 30% FiO2 overnight  Now on 3 L nasal cannula oxygen  Mental status much improved  ABGs showing persistent hypercapnic respiratory failure despite BiPAP which represents failure of BiPAP. I believe the patient's respiratory failure with hypercapnia is due to underlying COPD.  I believe she will benefit from noninvasive positive pressure ventilator at discharge for home use for target tidal volume, backup battery, and close  monitoring to help reduce the risk of exacerbations, hospitalizations, and mortality.  Will ask for case management to help with procurement.    Bilateral  pneumonia  Continue broad-spectrum antibiotics  Cultures sent  Expanded pneumonia workup sent  Further changes in antibiotics based on clinical response and culture results  Last chest x-ray showing extensive bilateral infiltrates  Will repeat chest x-ray this a.m.  Start doxycycline   Underlying COPD  Continue Solu-Medrol   Continue nebulizers  Will need PFTs in outpatient after discharge  May benefit from noninvasive positive pressure ventilator at discharge    Altered mental status likely combination of hypoxia, hypercapnia, polysubstance abuse  UDS positive for amphetamines  Ammonia normal  Neurology evaluated recommended EEG depending on clinical status  Psychiatry following and have recommended Xolox and Zyprexa   Monitor neurological status with treatment    Liver cirrhosis  Will need to see GI in outpatient  Stable for now    DVT and GI prophylaxis    Clinically improved  Out of bed to chair with PT OT  Will follow-up with me in outpatient clinic after discharge for PFTs and sleep evaluation    Questions of patient were answered at bedside in detail  Case discussed in detail with RN, RT, and care team including hospitalist  Thank you for involving me in the care of this patient  I will follow with you closely during hospitalization    Time spent more than 75 minutes in direct patient care with no overlap reviewing results and records, decision making, and answering questions.      MANCEL DELENA HAIR, MD  [facility] (PAT)  06/11/2024  9:05 AM             "

## 2024-06-11 NOTE — Progress Notes (Signed)
"  4 Eyes Skin Assessment     NAME:  Breanna Lester  DATE OF BIRTH:  01-Jan-1961  MEDICAL RECORD NUMBER:  [id number]    The patient is being assessed for  Other Weekly    I agree that at least one RN has performed a thorough Head to Toe Skin Assessment on the patient. ALL assessment sites listed below have been assessed.      Areas assessed by both nurses:    Head, Face, Ears, Shoulders, Back, Chest, Arms, Elbows, Hands, Sacrum. Buttock, Coccyx, Ischium, and Legs. Feet and Heels        Does the Patient have a Wound? Yes wound(s) were present on assessment. LDA wound assessment was Initiated and completed by RN       Braden Prevention initiated by RN: Yes  Wound Care Orders initiated by RN: Yes    For hospital-acquired stage 1 & 2 and ALL Stage 3,4, Unstageable, DTI, NWPT, and Complex wounds: place order IP Wound Care/Ostomy Nurse Eval and Treat by RN under ORDER ENTRY: NA    New Ostomies, if present place, Ostomy referral order under ORDER ENTRY: No     Nurse 1 eSignature: Electronically signed by Harlene Arna Octave, RN on 06/11/24 at 1:41 AM EDT    **SHARE this note so that the co-signing nurse can place an eSignature**    Nurse 2 eSignature: Electronically signed by Abran Flatter, RN on 06/11/24 at 7:05 AM EDT    "

## 2024-06-11 NOTE — Plan of Care (Signed)
 "         PHYSICAL THERAPY TREATMENT     Patient: Breanna Lester (63 y.o. female)  Date: 06/11/2024  Diagnosis: Acute respiratory failure, unspecified whether with hypoxia or hypercapnia (HCC) [J96.00]  Acute on chronic congestive heart failure, unspecified heart failure type (HCC) [I50.9]  Acute hypoxic respiratory failure (HCC) [J96.01] Acute respiratory failure (HCC)      Precautions: General Precautions, Fall Risk, Bed Alarm                      Recommendations for nursing mobility: Out of bed to chair for meals, Frequent repositioning to prevent skin breakdown, Use of bed/chair alarm for safety, Use of BSC for toileting , AD and gt belt for bed to chair , and Assist x1    In place during session: External Catheter and EKG/telemetry   Chart, physical therapy assessment, plan of care and goals were reviewed.  ASSESSMENT  Patient continues with skilled PT services and is slowly progressing towards goals. Pt sitting on eob upon PT arrival, agreeable to session. Pt A&O x 4. (See below for objective details and assist levels).     Overall pt tolerated session fair today with PT treatment.  patient sitting on eob however B feet not touching the floor. Patient scoot to the eob with vc's and sba. Patient perform therex while sitting at eob. Patient required frequent rests throughout the therex session. Patient stated that she had already done her AP's while lying in bed. Patient transfer sit>stand with min/mod assist x 1 rocking prior to performing and vc's to push up from surface. Patient stood and positioned herself in midline. Patient gt ~7' with rw and min assist x 1. Patient gt with slow cadence with decrease step length B. Patient with decrease balance. Patient did not ambulate as far today but did require less assistance today. Patient transfer stand>sit with min/mod assist x 1 and step by step cues for safety. Patient left up in bedside chair with all needs met.  Will continue to benefit from skilled PT services,  and will continue to progress as tolerated. Current PT DC recommendation Moderate intensity short-term skilled physical therapy up to 5x/week once medically appropriate.     Start of Session End of Session   SPO2 (%) 95 100   Heart Rate (BPM) 55 57     GOALS:    Problem: Physical Therapy - Adult  Goal: By Discharge: Performs mobility at highest level of function for planned discharge setting.  See evaluation for individualized goals.  Description: FUNCTIONAL STATUS PRIOR TO ADMISSION: Pt required assistance with ADLs and IADLs    HOME SUPPORT: The patient lived with daughter to provide assistance but is not available to 24/7 for assistance    Physical Therapy Goals  Initiated 06/09/2024  Pt stated goal: to go home  Pt will be I with LE HEP in 7 days.  Pt will perform bed mobility with CGA in 7 days.  Pt will perform transfers with CGA in 7 days.   Pt will amb 100 feet with LRAD safely with Contact Guard Assist in 7 days.  Pt will demonstrate improvement in standing balance from Moderate Assist x2 to CGA in 7 days.      Outcome: Progressing          PLAN :  Patient continues to benefit from skilled intervention to address functional impairments. Continue treatment per established plan of care to address goals.    Recommendation for  discharge: (in order for the patient to meet his/her long term goals)  Moderate intensity short-term skilled physical therapy up to 5x/week    Potential barriers for safe discharge: pts available support system works or is unable to provide adequate supervision leaving the patient alone at times during the day, pt is a high fall risk, pt is not safe to be alone, and concern for pt safely navigating or managing the home environment.    IF patient discharges home will need the following IFZ:[id number] to assess with progress     SUBJECTIVE:   Patient stated I feel good.    OBJECTIVE DATA SUMMARY:   Critical Behavior:  Orientation  Overall Orientation Status: Within Functional  Limits  Orientation Level: Oriented X4       Functional Mobility Training:  Bed Mobility:  Bed Mobility Training  Bed Mobility Training: No  Scooting: Stand by assistance  Transfers:  Transfer Training  Transfer Training: Yes  Interventions: Manual cues;Safety awareness training  Sit to Stand: Minimal assistance;Partial/Moderate assistance  Stand to Sit: Minimal assistance;Partial/Moderate assistance  Balance:  Balance  Sitting: Intact  Standing: Impaired  Standing - Static: Constant support;Fair  Standing - Dynamic: Constant support;Fair  Wheelchair Mobility:     Ambulation/Gait Training:                                Soil Scientist: Yes  Overall Level of Assistance: Minimal assistance  Distance (ft): 7 Feet  Assistive Device: Gait belt;Walker, rolling  Interventions: Manual cues;Safety awareness training;Verbal cues  Base of Support: Widened  Speed/Cadence: Slow  Step Length: Right shortened;Left shortened  Gait Abnormalities: Decreased step clearance                     Therapeutic Intervention provided:   OOB transfers, amb with AD, LE therapeutic exercises, and standing static and dynamic balance      EXERCISE   Sets   Reps   Active Active Assist   Passive Self ROM   Comments   Ankle Pumps   []  []  []  []     Quad Sets/Glut Sets   []  []  []  []     Hamstring Sets   []  []  []  []     Short Arc Quads   []  []  []  []     Heel Slides   []  []  []  []     Straight Leg Raises   []  []  []  []     Hip abd/add  10 [x]  []  []  []  B   Long Arc Quads  10 [x]  []  []  []  B   Marching  10 [x]  []  []  []  B   Seated HR/TR   []  []  []  []        []  []  []  []        Pain Rating:  8/10 back patient reports this is constant  Pain Intervention(s):       Activity Tolerance:   Fair     After treatment patient left in no apparent distress:   Bed locked and returned to lowest position, Patient left in no apparent distress sitting up in chair, Call bell within reach, and Bed/ chair alarm activated, and nsg updated     COMMUNICATION/COLLABORATION:   The  patients plan of care was discussed with: Occupational therapy assistant and Registered nurse    Patient Education  Education Given To: Patient  Education Provided: Role of Therapy;Plan of Care;Transfer Training;Energy Conservation;Equipment;Mobility Training;Home Exercise Program  Education Provided Comments: role of therapy, HEP, safety with transfers and ambulation  Education Method: Demonstration;Verbal  Barriers to Learning: None  Education Outcome: Verbalized understanding;Demonstrated understanding;Continued education needed        Willma Pitman, PT  Minutes: 30           "

## 2024-06-11 NOTE — Care Coordination (Addendum)
"  CM met with patient to discuss DCP.    Patient recc for NIV, patient agreeable, current with home [facility] from [facility], will send referral for NIV to [facility]    Patient also recc'd for SNF, patient states she has past trauma with nursing homes and states she did not want to go to one and that her daughter lived with her.    CM attempted to call daughter, Harlene [phone], to discuss, however no answer at this time and not able to leave VM.    CM continues to follow.   "

## 2024-06-11 NOTE — Progress Notes (Signed)
 "Comprehensive Nutrition Assessment    Type and Reason for Visit:  Reassess    Nutrition Recommendations/Plan:   Continue Current Diet  Encourage PO intakes >50%  Monitor/document wt, BM, PO+ONS% in I/O     Malnutrition Assessment:  Malnutrition Status:  At risk for malnutrition (06/04/24 1141)    Context:  Acute Illness     Findings of the 6 clinical characteristics of malnutrition:  Energy Intake:  Unable to assess  Weight Loss:  Mild weight loss (-4% x 1 month)     Body Fat Loss:  No body fat loss     Muscle Mass Loss:  No muscle mass loss    Fluid Accumulation:  No fluid accumulation    Grip Strength:  Not Performed    Nutrition Assessment:    10/22: visited pt in room for f/u, pt very pleasant, had no f/u questions from diet education y/day; pt reported improving appetite and good intake, no po intake documented; pt reported no n/v/d and didn't want any ONS, felt she was eating enough; will continue to monitor; labs and meds reviewed, pt on statin, lantus , humalog   Pt admitted with pneumonia. Pt with continued respiratory distress on biPAP, intubated and admitted to ICU. MD started TF, RD to modify per discussion in IDRs. Pt reportedly housing insecure, staying in motels. Food insecurity noted on last admission. Recommend Vital HP@15ml /hr +2PS BID. Propofol  currently at 50 mcg/kg/min (36.8 mL/hr, 972 kcals/day). NE @ 5 mcg/min. Labs and meds reviewed. A1C 8.2%. (10/20) Patient extubated, receives a 3 CHO diet, unable to assess po intake, SLP states unable to complete assessment, patient will not take food. Current weight is down, BMI continues at Obesity Class 3. K, Mag, Phos WNL. Will refer patient to out patient nutrition counseling.    Nutrition Related Findings:      Wound Type: None   Last BM: 06/08/24  Edema: Right lower extremity, Left lower extremity, Right upper extremity, Left upper extremity      RUE Edema: Trace  LUE Edema: Trace  RLE Edema: Trace  LLE Edema: Trace    Nutr. Labs:    Lab Results    Component Value Date    CREATININE 0.68 06/11/2024    BUN 20 06/11/2024    NA 142 06/11/2024    K 3.6 06/11/2024    CL 99 06/11/2024    CO2 36 (H) 06/11/2024       Lab Results   Component Value Date/Time    POCGLU 85 06/11/2024 06:26 AM    POCGLU 93 06/11/2024 02:18 AM    POCGLU 143 06/10/2024 08:22 PM    POCGLU 116 06/10/2024 04:22 PM    POCGLU 120 06/10/2024 12:49 PM    POCGLU 77 06/10/2024 07:52 AM        Hemoglobin A1C   Date Value Ref Range Status   04/24/2024 8.2 (H) 4.0 - 5.6 % Final     Comment:     Reference Range  Normal       <5.7%  Prediabetes  5.7-6.4%  Diabetes     >6.4%         Lab Results   Component Value Date/Time    MG 2.0 06/09/2024 02:00 AM       Lab Results   Component Value Date    CALCIUM  8.9 06/11/2024    PHOS 2.6 06/09/2024       Lab Results   Component Value Date    TRIG 127 04/15/2024  Nutr. Meds:  Scheduled Meds:   guaiFENesin   600 mg Oral BID    doxycycline  (VIBRAMYCIN ) IV  100 mg IntraVENous BID    ipratropium 0.5 mg-albuterol  2.5 mg  1 Dose Inhalation Q6H WA RT    influenza virus vaccine  1 Dose IntraMUSCular Prior to discharge    bumetanide   1 mg IntraVENous Daily    metoprolol  tartrate  50 mg Oral BID    OLANZapine   10 mg Oral Nightly    atorvastatin   40 mg Oral Nightly    budesonide   0.5 mg Nebulization BID RT    insulin  glargine  10 Units SubCUTAneous Daily    insulin  lispro  0-8 Units SubCUTAneous Q4H    [Held by provider] tiotropium bromide   2 puff Inhalation Daily RT    [Held by provider] budesonide -formoterol   2 puff Inhalation BID RT    cefTRIAXone  (ROCEPHIN ) IV  2,000 mg IntraVENous Q24H    sertraline   100 mg Oral Daily    apixaban   5 mg Per NG tube BID    sodium chloride  flush  5-40 mL IntraVENous 2 times per day    lidocaine  1 % injection  50 mg IntraDERmal Once     Continuous Infusions:   sodium chloride       dextrose       sodium chloride       sodium chloride  Stopped (06/05/24 2344)     PRN Meds: benzonatate , haloperidol  lactate, ipratropium 0.5 mg-albuterol  2.5  mg, sodium chloride , albuterol , hydrALAZINE , glucose, dextrose  bolus **OR** dextrose  bolus, glucagon , dextrose , sodium chloride  flush, sodium chloride , ALTEplase , sodium chloride  flush, sodium chloride , potassium chloride  **OR** potassium chloride , magnesium  sulfate, ondansetron  **OR** ondansetron , polyethylene glycol, acetaminophen  **OR** acetaminophen          Current Nutrition Intake & Therapies:    Average Meal Intake: Unable to assess  Average Supplements Intake: None Ordered  ADULT DIET; Easy to Chew; 3 carb choices (45 gm/meal)    Anthropometric Measures:  Height: 157.5 cm (5' 2)  Ideal Body Weight (IBW): 110 lbs (50 kg)       Current Body Weight: 119.6 kg (263 lb 10.7 oz), 239.7 % IBW. Weight Source: Bed scale  Current BMI (kg/m2): 48.2  Usual Body Weight: 125 kg (275 lb 9.2 oz) (September 2025)     % Weight Change (Calculated): -4.3  Weight Adjustment For: No Adjustment                 BMI Categories: Obese Class 3 (BMI 40.0 or greater)    Estimated Daily Nutrient Needs:  Energy Requirements Based On: Formula  Weight Used for Energy Requirements: Current  Energy (kcal/day): 1650-2300 kcals (20 kcal/kg, MSJ 1.0/1.0)  Weight Used for Protein Requirements: Current  Protein (g/day): 92-115 gr (0.8-1.0 gr/kg)  Method Used for Fluid Requirements: 1 ml/kcal  Fluid (ml/day): 1600-2300 ml    Nutrition Diagnosis:   No nutrition diagnosis at this time    Nutrition Interventions:   Food and/or Nutrient Delivery: Continue Current Diet  Nutrition Education/Counseling: Education/Counseling completed  Coordination of Nutrition Care: Continue to monitor while inpatient       Goals:  Goals: PO intake 50% or greater, by next RD assessment  Type of Goal: Continue current goal  Previous Goal Met: Progressing toward Goal(s)    Nutrition Monitoring and Evaluation:   Behavioral-Environmental Outcomes: None Identified  Food/Nutrient Intake Outcomes: Food and Nutrient Intake  Physical Signs/Symptoms Outcomes: Biochemical Data,  Nutrition Focused Physical Findings, Weight, Skin, Fluid Status or Edema    Discharge Planning:  Recommend pursue outpatient nutrition counseling     Aleck Ferrari, RD  Contact: perfectserv    "

## 2024-06-11 NOTE — Progress Notes (Signed)
 "  IMPRESSION:   Acute hypoxic respiratory failure  Chronic hypoxic respiratory failure  Chronic hypercapnic respiratory failure  Bilateral pneumonia  Cirrhosis of liver  Chronic A-fib  Additional workup outlined below  Pt is at high risk of sudden decline and decompensation with life threatening consequenses and continued end organ dysfunction and failure  Pt is critically ill. Time spent with pt and staff actively rendering care, managing pt and coordinating care as stated below; 35 minutes, exclusive of any procedures      RECOMMENDATIONS/PLAN:   63 year old obese lady came in because of shortness of breath and dyspnea she is chronically on home oxygen she did not have any sleep study done in the past she has history of hypertension chronic A-fib history of EtOH cirrhosis of liver, patient also has COPD she was severely hypoxic and she was put on noninvasive ventilator CAT scan of the chest was done which showed bilateral pneumonia patient was intubated and successfully weaned off and she is using BiPAP at night and oxygen during the daytime  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 CT head was negative  Continue use antibiotic for bilateral pneumonia  Nocturnal CPAP she never had any sleep study done  Patient has had worsening respiratory failure and is at risk for serious harm or death without appropriate therapy at home which include noninvasive ventilator while patient has an underlying condition of OSA it is not the primary indication for need for NIV patient has previously tried CPAP and BiPAP and has failed to improve his hypercapnia and saturation level.  Since chronic/acute respiratory failure can be prevented with the use of positive pressure ventilation and due to the nature of the patient deteriorating condition a portable volume ventilator has been ordered via mask interface for use at night as well as during the day.  The patient requires continuous access to noninvasive  positive pressure ventilation therapy to reduce respiratory distress, minimize hospitalization and to control carbon dioxide retention.  On 2 L nasal Cannula oxygen as salvage oxygen delivery device to provide high concentration of oxygen to overcome refractory hypoxia;   Continue with Bumex   Overnight pulse oximetry shows desaturation for 24 minutes qualify for home oxygen  Transfuse prn to maintain Hgb > 7  Labs to follow electrolytes, renal function and and blood counts  Bronchial hygiene with respiratory therapy techniques, bronchodilators  Pt needs IV fluids with additives and Drug therapy requiring intensive monitoring for toxicity  Prescription drug management with home med reconciliation reviewed  DVT, SUP prophylaxis     10/22  Alert awake eating breakfast on oxygen via nasal cannula getting nebulizer treatment use the noninvasive ventilator BiPAP her pCO2 was 60 discussed with her about getting noninvasive ventilator will order one but issue is the compliance, she had a history of polysubstance abuse and will do 6-minute walk test before discharge to see if she qualifies for POC.    [x]  High complexity decision making was performed  [x]  See my orders for details    PMH:  has a past medical history of Anxiety, Depression, Gastrointestinal disorder, Heart failure (HCC), Hyperlipidemia, Hypertension, Liver disease, and Pneumonia.    PSH:   has a past surgical history that includes Cataract removal; egd transoral biopsy single/multiple (05/17/2009); Dilation and curettage of uterus (1999); Hysterectomy (2008); Cesarean section (1986 and 1993); Adenoidectomy (1972); Tonsillectomy (1972); back surgery; and Upper gastrointestinal endoscopy (N/A, 04/17/2024).     FHX: family history includes Cancer in her mother; Heart Disease in  her father.     SHX:  reports that she quit smoking about 16 years ago. Her smoking use included cigarettes. She has never used smokeless tobacco. She reports that she does not drink  alcohol and does not use drugs.    ALL:   Allergies   Allergen Reactions    Latex Rash        MEDS:   [x]  Reviewed - As Below   []  Not reviewed    Current Facility-Administered Medications   Medication Dose Route Frequency Provider Last Rate Last Admin    guaiFENesin  (MUCINEX ) extended release tablet 600 mg  600 mg Oral BID Gillard, Madison A, PA-C   600 mg at 06/11/24 9144    benzonatate  (TESSALON ) capsule 100 mg  100 mg Oral TID PRN Ronn Dixon A, PA-C   100 mg at 06/10/24 1217    doxycycline  (VIBRAMYCIN ) 100 mg in sodium chloride  0.9 % 100 mL IVPB (addEASE)  100 mg IntraVENous BID Minai, Omar A, MD 100 mL/hr at 06/11/24 0859 100 mg at 06/11/24 0859    ipratropium 0.5 mg-albuterol  2.5 mg (DUONEB ) nebulizer solution 1 Dose  1 Dose Inhalation Q6H WA RT Chayil Gantt, Kristi, MD   1 Dose at 06/11/24 0847    influenza tiss-cult vaccine (FLUCELVAX) injection 0.5 mL  1 Dose IntraMUSCular Prior to discharge Aimee Elspeth SAUNDERS, MD        bumetanide  (BUMEX ) injection 1 mg  1 mg IntraVENous Daily Azeem, Ahad, MD   1 mg at 06/10/24 9173    metoprolol  tartrate (LOPRESSOR ) tablet 50 mg  50 mg Oral BID Azeem, Ahad, MD   50 mg at 06/11/24 0856    OLANZapine  (ZYPREXA ) tablet 10 mg  10 mg Oral Nightly Punyala, Srinivasa R, MD   10 mg at 06/11/24 0238    atorvastatin  (LIPITOR ) tablet 40 mg  40 mg Oral Nightly Azeem, Ahad, MD   40 mg at 06/10/24 2246    haloperidol  lactate (HALDOL ) injection 5 mg  5 mg IntraVENous Q6H PRN Azeem, Ahad, MD   5 mg at 06/08/24 0438    budesonide  (PULMICORT ) nebulizer suspension 500 mcg  0.5 mg Nebulization BID RT Nunna, Krishidhar R, MD   500 mcg at 06/11/24 0847    insulin  glargine (LANTUS ) injection vial 10 Units  10 Units SubCUTAneous Daily Nunna, Krishidhar R, MD   10 Units at 06/11/24 0856    ipratropium 0.5 mg-albuterol  2.5 mg (DUONEB ) nebulizer solution 1 Dose  1 Dose Inhalation Q4H PRN Nunna, Krishidhar R, MD   1 Dose at 06/08/24 0304    insulin  lispro (HUMALOG ,ADMELOG ) injection vial 0-8 Units   0-8 Units SubCUTAneous Q4H Nunna, Krishidhar R, MD   2 Units at 06/07/24 2042    0.9 % sodium chloride  infusion   IntraVENous PRN Nunna, Krishidhar R, MD        albuterol  (PROVENTIL ) nebulizer solution 2.5 mg  2.5 mg Nebulization Q4H PRN Nunna, Krishidhar R, MD        [Held by provider] tiotropium bromide  (SPIRIVA  RESPIMAT) 2.5 MCG/ACT inhaler 2 puff  2 puff Inhalation Daily RT Rawland Veleta SAUNDERS, MD        [Held by provider] budesonide -formoterol  (SYMBICORT ) 160-4.5 MCG/ACT inhaler 2 puff  2 puff Inhalation BID RT Nunna, Krishidhar R, MD        hydrALAZINE  (APRESOLINE ) injection 10 mg  10 mg IntraVENous Q4H PRN Sahib, Taaliba A, APRN - NP   10 mg at 06/07/24 0202    cefTRIAXone  (ROCEPHIN ) 2,000 mg in sterile water   20 mL IV syringe  2,000 mg IntraVENous Q24H Gillard, Madison A, PA-C   2,000 mg at 06/11/24 0222    glucose chewable tablet 16 g  4 tablet Oral PRN Rawland Veleta SAUNDERS, MD        dextrose  bolus 10% 125 mL  125 mL IntraVENous PRN Rawland, Krishidhar R, MD        Or    dextrose  bolus 10% 250 mL  250 mL IntraVENous PRN Nunna, Krishidhar R, MD        glucagon  injection 1 mg  1 mg SubCUTAneous PRN Nunna, Krishidhar R, MD        dextrose  10 % infusion   IntraVENous Continuous PRN Nunna, Krishidhar R, MD        sertraline  (ZOLOFT ) tablet 100 mg  100 mg Oral Daily Nunna, Krishidhar R, MD   100 mg at 06/11/24 0855    apixaban  (ELIQUIS ) tablet 5 mg  5 mg Per NG tube BID Nunna, Krishidhar R, MD   5 mg at 06/11/24 0855    sodium chloride  flush 0.9 % injection 5-40 mL  5-40 mL IntraVENous 2 times per day Marvis Lynwood HERO, APRN - CNP   10 mL at 06/10/24 2310    sodium chloride  flush 0.9 % injection 5-40 mL  5-40 mL IntraVENous PRN Marvis Lynwood HERO, APRN - CNP        0.9 % sodium chloride  infusion   IntraVENous PRN Marvis Lynwood HERO, APRN - CNP        lidocaine  1 % injection 50 mg  50 mg IntraDERmal Once Marvis Lynwood HERO, APRN - CNP        ALTEplase  (CATHFLO) injection 2 mg  2 mg IntraCATHeter PRN Marvis Lynwood HERO, APRN - CNP         sodium chloride  flush 0.9 % injection 5-40 mL  5-40 mL IntraVENous PRN Marvis Lynwood HERO, APRN - CNP        0.9 % sodium chloride  infusion   IntraVENous PRN Marvis Lynwood HERO, APRN - CNP   Stopped at 06/05/24 2344    potassium chloride  20 mEq/50 mL IVPB (Central Line)  20 mEq IntraVENous PRN Marvis Lynwood HERO, APRN - CNP        Or    potassium chloride  10 mEq/100 mL IVPB (Peripheral Line)  10 mEq IntraVENous PRN Marvis Lynwood HERO, APRN - CNP        magnesium  sulfate 2000 mg in 50 mL IVPB premix  2,000 mg IntraVENous PRN Marvis Lynwood HERO, APRN - CNP        ondansetron  (ZOFRAN -ODT) disintegrating tablet 4 mg  4 mg Oral Q8H PRN Marvis Lynwood HERO, APRN - CNP        Or    ondansetron  (ZOFRAN ) injection 4 mg  4 mg IntraVENous Q6H PRN Marvis Lynwood HERO, APRN - CNP   4 mg at 06/07/24 1021    polyethylene glycol (GLYCOLAX ) packet 17 g  17 g Oral Daily PRN Marvis Lynwood HERO, APRN - CNP        acetaminophen  (TYLENOL ) tablet 650 mg  650 mg Oral Q6H PRN Marvis Lynwood HERO, APRN - CNP   650 mg at 06/10/24 1508    Or    acetaminophen  (TYLENOL ) suppository 650 mg  650 mg Rectal Q6H PRN Marvis Lynwood HERO, APRN - CNP            MAR reviewed and pertinent medications noted or modified as needed      MND:Ezmupwzwu items are noted  in HPI.      Hemodynamics:    CO:    CI:    CVP:    SVR:   PAP Systolic:    PAP Diastolic:    PVR:    SV02:        Ventilator Settings:      Mode Rate TV Press PEEP FiO2 PIP Min. Vent   (S) CPAP/PS 16 bpm  380 mL    6 30 %  13 cmH2O           Vital Signs: Telemetry:    AFIB Intake/Output:   BP 132/60   Pulse 68   Temp 98.1 F (36.7 C) (Oral)   Resp 18   Ht 1.575 m (5' 2)   Wt 112.2 kg (247 lb 5.7 oz)   SpO2 95%   BMI 45.23 kg/m     Temp (24hrs), Avg:98.2 F (36.8 C), Min:97.7 F (36.5 C), Max:98.8 F (37.1 C)        O2 Device: Nasal cannula O2 Flow Rate (L/min): 3 L/min       Wt Readings from Last 4 Encounters:   06/10/24 112.2 kg (247 lb 5.7 oz)   06/06/24 126.6 kg (279 lb 1.6 oz)   05/03/24 129.7 kg (286 lb)   05/05/24 129.7 kg (286  lb)          Intake/Output Summary (Last 24 hours) at 06/11/2024 0906  Last data filed at 06/10/2024 1634  Gross per 24 hour   Intake 96.87 ml   Output 1200 ml   Net -1103.13 ml       Last shift:      No intake/output data recorded.  Last 3 shifts: 10/20 1901 - 10/22 0700  In: 156.9 [P.O.:50; I.V.:10]  Out: 1300 [Urine:1300]       Physical Exam:     General: Caucasian female; alert awake on oxygen via nasal cannula CPAP at night  HEENT: NCAT, poor dentition, lips and mucosa dry  Eyes: anicteric; conjunctiva clear  Neck: no nodes, trach midline; no accessory MM use.  Chest: no deformity,  Cardiac: IR regular; no murmur;   Lungs: distant breath sounds; bilateral rales  Abd: soft, NT, hypoactive BS  Ext: Bilateral leg edema; no joint swelling; No clubbing  GU: NO foley, clear urine  Neuro: No focal deficit  Psych- no agitation, oriented to person;   Skin: warm, dry, no cyanosis;   Pulses: 1-2+ Bilateral pedal, radial  Capillary: brisk; pale      DATA:  No results found for this or any previous visit.    05/02/24    ECHO (TTE) COMPLETE (PRN CONTRAST/BUBBLE/STRAIN/3D) 05/06/2024 10:52 AM (Final)    Interpretation Summary    Left Ventricle: Normal left ventricular systolic function with a visually estimated EF of 70 - 75%. Left ventricle size is normal. Normal wall thickness. Normal wall motion. Normal diastolic function.    Right Ventricle: Right ventricle is moderately dilated. Normal systolic function.    Right Atrium: Right atrium is mildly dilated.    Pericardium: Moderate (1-2 cm) localized pericardial effusion present around the left ventricle. Pericardial posterior effusion measures 1.0 cm.    Image quality is adequate. Contrast used: Lumason . Technically difficult study, technically difficult study with poor endocardial visualization, technically difficult study due to patient's body habitus and technically difficult study due to patient's heart rhythm.    Signed by: Laquetta Commander, MD on 05/06/2024 10:52 AM        MAR reviewed and pertinent medications noted or  modified as needed    MEDS:   Current Facility-Administered Medications   Medication Dose Route Frequency Provider Last Rate Last Admin    guaiFENesin  (MUCINEX ) extended release tablet 600 mg  600 mg Oral BID Gillard, Madison A, PA-C   600 mg at 06/11/24 9144    benzonatate  (TESSALON ) capsule 100 mg  100 mg Oral TID PRN Ronn Dixon A, PA-C   100 mg at 06/10/24 1217    doxycycline  (VIBRAMYCIN ) 100 mg in sodium chloride  0.9 % 100 mL IVPB (addEASE)  100 mg IntraVENous BID Minai, Omar A, MD 100 mL/hr at 06/11/24 0859 100 mg at 06/11/24 0859    ipratropium 0.5 mg-albuterol  2.5 mg (DUONEB ) nebulizer solution 1 Dose  1 Dose Inhalation Q6H WA RT Dorota Heinrichs, Kristi, MD   1 Dose at 06/11/24 0847    influenza tiss-cult vaccine (FLUCELVAX) injection 0.5 mL  1 Dose IntraMUSCular Prior to discharge Aimee Elspeth SAUNDERS, MD        bumetanide  (BUMEX ) injection 1 mg  1 mg IntraVENous Daily Azeem, Ahad, MD   1 mg at 06/10/24 0826    metoprolol  tartrate (LOPRESSOR ) tablet 50 mg  50 mg Oral BID Azeem, Ahad, MD   50 mg at 06/11/24 0856    OLANZapine  (ZYPREXA ) tablet 10 mg  10 mg Oral Nightly Punyala, Srinivasa R, MD   10 mg at 06/11/24 0238    atorvastatin  (LIPITOR ) tablet 40 mg  40 mg Oral Nightly Azeem, Ahad, MD   40 mg at 06/10/24 2246    haloperidol  lactate (HALDOL ) injection 5 mg  5 mg IntraVENous Q6H PRN Azeem, Ahad, MD   5 mg at 06/08/24 0438    budesonide  (PULMICORT ) nebulizer suspension 500 mcg  0.5 mg Nebulization BID RT Nunna, Krishidhar R, MD   500 mcg at 06/11/24 9152    insulin  glargine (LANTUS ) injection vial 10 Units  10 Units SubCUTAneous Daily Nunna, Krishidhar R, MD   10 Units at 06/11/24 0856    ipratropium 0.5 mg-albuterol  2.5 mg (DUONEB ) nebulizer solution 1 Dose  1 Dose Inhalation Q4H PRN Nunna, Krishidhar R, MD   1 Dose at 06/08/24 0304    insulin  lispro (HUMALOG ,ADMELOG ) injection vial 0-8 Units  0-8 Units SubCUTAneous Q4H Nunna, Krishidhar R, MD   2 Units at  06/07/24 2042    0.9 % sodium chloride  infusion   IntraVENous PRN Nunna, Krishidhar R, MD        albuterol  (PROVENTIL ) nebulizer solution 2.5 mg  2.5 mg Nebulization Q4H PRN Nunna, Krishidhar R, MD        [Held by provider] tiotropium bromide  (SPIRIVA  RESPIMAT) 2.5 MCG/ACT inhaler 2 puff  2 puff Inhalation Daily RT Nunna, Krishidhar R, MD        [Held by provider] budesonide -formoterol  (SYMBICORT ) 160-4.5 MCG/ACT inhaler 2 puff  2 puff Inhalation BID RT Nunna, Krishidhar R, MD        hydrALAZINE  (APRESOLINE ) injection 10 mg  10 mg IntraVENous Q4H PRN Sahib, Taaliba A, APRN - NP   10 mg at 06/07/24 0202    cefTRIAXone  (ROCEPHIN ) 2,000 mg in sterile water  20 mL IV syringe  2,000 mg IntraVENous Q24H Gillard, Madison A, PA-C   2,000 mg at 06/11/24 0222    glucose chewable tablet 16 g  4 tablet Oral PRN Rawland Veleta SAUNDERS, MD        dextrose  bolus 10% 125 mL  125 mL IntraVENous PRN Nunna, Krishidhar R, MD        Or    dextrose  bolus 10%  250 mL  250 mL IntraVENous PRN Nunna, Krishidhar R, MD        glucagon  injection 1 mg  1 mg SubCUTAneous PRN Nunna, Krishidhar R, MD        dextrose  10 % infusion   IntraVENous Continuous PRN Nunna, Krishidhar R, MD        sertraline  (ZOLOFT ) tablet 100 mg  100 mg Oral Daily Nunna, Krishidhar R, MD   100 mg at 06/11/24 0855    apixaban  (ELIQUIS ) tablet 5 mg  5 mg Per NG tube BID Nunna, Krishidhar R, MD   5 mg at 06/11/24 0855    sodium chloride  flush 0.9 % injection 5-40 mL  5-40 mL IntraVENous 2 times per day Marvis Lynwood HERO, APRN - CNP   10 mL at 06/10/24 2310    sodium chloride  flush 0.9 % injection 5-40 mL  5-40 mL IntraVENous PRN Marvis Lynwood HERO, APRN - CNP        0.9 % sodium chloride  infusion   IntraVENous PRN Marvis Lynwood HERO, APRN - CNP        lidocaine  1 % injection 50 mg  50 mg IntraDERmal Once Marvis Lynwood HERO, APRN - CNP        ALTEplase  (CATHFLO) injection 2 mg  2 mg IntraCATHeter PRN Marvis Lynwood HERO, APRN - CNP        sodium chloride  flush 0.9 % injection 5-40 mL  5-40 mL IntraVENous  PRN Marvis Lynwood HERO, APRN - CNP        0.9 % sodium chloride  infusion   IntraVENous PRN Marvis Lynwood HERO, APRN - CNP   Stopped at 06/05/24 2344    potassium chloride  20 mEq/50 mL IVPB (Central Line)  20 mEq IntraVENous PRN Marvis Lynwood HERO, APRN - CNP        Or    potassium chloride  10 mEq/100 mL IVPB (Peripheral Line)  10 mEq IntraVENous PRN Marvis Lynwood HERO, APRN - CNP        magnesium  sulfate 2000 mg in 50 mL IVPB premix  2,000 mg IntraVENous PRN Marvis Lynwood HERO, APRN - CNP        ondansetron  (ZOFRAN -ODT) disintegrating tablet 4 mg  4 mg Oral Q8H PRN Marvis Lynwood HERO, APRN - CNP        Or    ondansetron  (ZOFRAN ) injection 4 mg  4 mg IntraVENous Q6H PRN Marvis Lynwood HERO, APRN - CNP   4 mg at 06/07/24 1021    polyethylene glycol (GLYCOLAX ) packet 17 g  17 g Oral Daily PRN Marvis Lynwood HERO, APRN - CNP        acetaminophen  (TYLENOL ) tablet 650 mg  650 mg Oral Q6H PRN Marvis Lynwood HERO, APRN - CNP   650 mg at 06/10/24 1508    Or    acetaminophen  (TYLENOL ) suppository 650 mg  650 mg Rectal Q6H PRN Marvis Lynwood HERO, APRN - CNP            ARTERIAL BLOOD GAS  Recent Labs     06/11/24  0411   PHART 7.40   PCO2ART 57*   PO2ART 78*   HCO3ART 34*   BEART 8.0*   FIO2A 32   O2SATART 95   OXYHEM 93.4*   CARBOXHGBART 0.9*   METHGBART 0.4   TEMP 98.4     Labs:    Recent Labs     06/09/24  0200 06/10/24  0300   WBC 6.5 7.9   HGB 10.1* 10.1*   PLT 132*  103*     Recent Labs     06/09/24  0200 06/10/24  0300   NA 142 141   K 3.8 3.8   CL 102 100   CO2 34* 33*   GLUCOSE 131* 89   BUN 20 22   CREATININE 0.66 0.63   CALCIUM  8.3* 8.2*   MG 2.0  --    PHOS 2.6  --    BILITOT 1.0 0.8   AST 33 31   ALT 21 16     No results for input(s): CKTOTAL, CKMB, CKMBINDEX, TROPONINI in the last 72 hours.  Lab Results   Component Value Date/Time    PROBNP 11,914 06/03/2024 05:54 PM      Lab Results   Component Value Date/Time    TSH 2.400 06/09/2024 01:50 PM      Results       Procedure Component Value Units Date/Time    Culture, Blood 1 [[id number]] Collected: 06/09/24  1420    Order Status: Sent Specimen: Blood Updated: 06/09/24 1451    Legionella antigen, urine [[id number]] Collected: 06/04/24 1228    Order Status: Completed Specimen: Urine Updated: 06/06/24 1636     Sample Site Urine        L pneumophila S1 Ag, Ur Negative        Comment: Presumptive negative for L. pneumophila serogroup 1 antigen in urine,  suggesting no recent or current infection. Legionnaires' disease  cannot be ruled out since other serogroups and species may also  cause disease.  Performed At: [facility]  [address] [place], [place] [id number]  Jennette Shorter MD Ey:[phone]         S. pneumonia Antigen, Urine/CSF [[id number]] Collected: 06/04/24 1228    Order Status: Completed Specimen: Urine Updated: 06/06/24 1636     Sample Site Urine        Specimen Urine     S pneumoniae Ag, CSF Negative        FLUID CULTURE Not indicated.     Organism Id Not indicated.     Please note: Comment        Comment: [employer] standards require a culture to be  performed on CSF specimens submitted for bacterial antigen testing.  (CAP [id number]) Urine specimens will not be cultured.  Performed At: [facility]  [address] [place], [place] [id number]  Jennette Shorter MD Ey:[phone]         COVID-19 & Influenza Combo [[id number]] Collected: 06/04/24 0845    Order Status: Completed Specimen: Nasopharynx Updated: 06/04/24 0934     SARS-CoV-2, PCR Not Detected        Comment: Not Detected results do not preclude SARS-CoV-2 infection and should not be used as the sole basis for patient management decisions. Results must be combined with clinical observations, patient history, and epidemiological information        Rapid Influenza A By PCR Not Detected        Rapid Influenza B By PCR Not Detected        Comment:    Testing was performed using cobas Liat SARS-CoV-2 and Influenza A/B nucleic acid assay.  This test is a multiplex Real-Time Reverse Transcriptas         Culture, Blood 2  [[id number]]  (Abnormal) Collected: 06/04/24 0300    Order Status: Completed Specimen: Blood Updated: 06/06/24 0836     Special Requests No Special Requests        Culture  Staphylococcus species, coagulase negative growing in 1 of 2 bottles drawn No Site Indicated                  This isolate represents a common contaminant and may or may not be clinically significant. Please notify the laboratory if further work is indicated.            (NOTE) CALLED TO ;CAMBPELL IRBY [email or web address] .AM    Culture, Blood, PCR ID Panel [[id number]]  (Abnormal) Collected: 06/04/24 0300    Order Status: Completed Specimen: Blood Updated: 06/05/24 1622     Accession Number Blood        Enterococcus faecalis by PCR Not detected     Enterococcus faecium by PCR Not detected     Listeria monocytogenes by PCR Not detected     STAPHYLOCOCCUS Detected        Staphylococcus Aureus Not detected     Staphylococcus epidermidis by PCR Detected        Staphylococcus lugdunensis by PCR Not detected     STREPTOCOCCUS Not detected     Streptococcus agalactiae (Group B) Not detected     Strep pneumoniae Not detected     Strep pyogenes,(Grp. A) Not detected     Acinetobacter calcoac baumannii complex by PCR Not detected     Bacteroides fragilis by PCR Not detected     Enterobacteriaceae by PCR Not detected     Enterobacter cloacae complex by PCR Not detected     Escherichia Coli Not detected     Klebsiella aerogenes by PCR Not detected     Klebsiella oxytoca by PCR Not detected     Klebsiella pneumoniae group by PCR Not detected     Proteus by PCR Not detected     Salmonella species by PCR Not detected     Serratia marcescens by PCR Not detected     Haemophilus Influenzae by PCR Not detected     Neisseria meningitidis by PCR Not detected     Pseudomonas aeruginosa Not detected     Stenotrophomonas maltophilia by PCR Not detected     Candida albicans by PCR Not detected     Candida auris by PCR Not detected     Candida glabrata Not detected     Candida  krusei by PCR Not detected     Candida parapsilosis by PCR Not detected     Candida tropicalis by PCR Not detected     Cryptococcus neoformans/gattii by PCR Not detected     Resistant gene targets          Methicillin Resistance mecA/C  by PCR Detected        [employer] test comment False positive results may rarely occur. Correlate with     Comment: clinical,epidemiologic,  and other laboratory findings  Please see BCID Interpretation Guide in EPIC Links         MRSA by PCR [[id number]] Collected: 06/04/24 0215    Order Status: Completed Specimen: Swab Updated: 06/04/24 0443     MRSA by PCR Not Detected       Culture, C Auris Screen [[id number]] Collected: 06/04/24 0215    Order Status: Completed Specimen: Swab Updated: 06/07/24 1449     Special Requests No Special Requests        Culture No Candida auris present       Culture, Blood 1 [[id number]] Collected: 06/04/24 0124    Order Status: Completed Specimen: Blood Updated: 06/10/24 1431     Special Requests No Special  Requests        Culture No growth 6 days       Culture, Urine [[id number]] Collected: 06/03/24 2204    Order Status: Completed Specimen: Urine Updated: 06/05/24 0811     Special Requests --        No Special Requests  Reflexed from [id number]       Culture No Growth (<1000 cfu/mL)              Imaging:  XR CHEST PORTABLE   Final Result      Persistent but improved diffuse bilateral pulmonary infiltrates.         Electronically signed by DARICE COLON      CTA HEAD NECK W CONTRAST   Final Result         1. No acute large vessel arterial occlusion. Mild to moderate atherosclerotic   changes.   2. Pulmonary hypertension.   3. Biapical airspace disease likely infectious/inflammatory.               Electronically signed by Aletha CHRISTELLA Ham      CT BRAIN PERFUSION   Final Result         1. No acute large vessel arterial occlusion. Mild to moderate atherosclerotic   changes.   2. Pulmonary hypertension.   3. Biapical airspace disease likely  infectious/inflammatory.               Electronically signed by Aletha CHRISTELLA Ham      CT HEAD WO CONTRAST   Final Result   No acute intracranial findings.         Electronically signed by PAULINE BLANCH      XR CHEST PORTABLE   Final Result   1. No change bilateral pneumonia         Electronically signed by Evalene JINNY Hoit      CT HEAD WO CONTRAST   Final Result      No acute intracranial abnormality.         Electronically signed by Toribio JINNY Musick      CTA CHEST W WO CONTRAST   Final Result   Extensive bilateral pulmonary infiltrates. No evidence of pulmonary embolism.         Electronically signed by DARICE COLON      XR CHEST PORTABLE   Final Result   1. Enlarged cardiac silhouette, worsening airspace disease or pulmonary edema.   No complicating features post intubation            Electronically signed by Susie Brain      XR CHEST PORTABLE   Final Result   Cardiomegaly and mild interstitial pulmonary edema.             Electronically signed by JIMMY HABIB          No results found.     This care involved high complexity decision making which includes independently reviewing the patient's past medical records, current laboratory results, medication profiles that were immediately available to me and actual Xray images at the bedside in order to assess, support vital system function, and to treat this degree of vital organ system failure, and to prevent further life threatening deterioration of the patients condition.     Medical Decision Making Today  Reviewed the flowsheet and previous days notes  Reviewed and summarized records or history from previous days note or discussions with staff, family  Parenteral controlled substances - Reviewed/ Adjusted / Weaned / Started  High Risk  Drug therapy requiring intensive monitoring for toxicity: eg steroids, pressors, antibiotics  Review and order of Clinical lab tests  Review and Order of Radiology tests  Review and Order of Medicine tests  Independent visualization of  radiologic Images  Reviewed Ventilator / NiPPV  I have personally reviewed the patients ECG / Telemetry  Diagnostic endoscopies with identified risk factors          "

## 2024-06-11 NOTE — Consults (Signed)
 "  IMPRESSION:   Acute hypoxic respiratory failure  Chronic hypoxic respiratory failure  Hypercapnic respiratory failure  Bilateral pneumonia  Cirrhosis of liver  Chronic A-fib  Additional workup outlined below  Pt is at high risk of sudden decline and decompensation with life threatening consequenses and continued end organ dysfunction and failure  Pt is critically ill. Time spent with pt and staff actively rendering care, managing pt and coordinating care as stated below; 55 minutes, exclusive of any procedures      RECOMMENDATIONS/PLAN:   63 year old obese lady came in because of shortness of breath and dyspnea she is chronically on home oxygen she did not have any sleep study done in the past she has history of hypertension chronic A-fib history of EtOH cirrhosis of liver, patient also has COPD she was severely hypoxic and she was put on noninvasive ventilator CAT scan of the chest was done which showed bilateral pneumonia patient was intubated and successfully weaned off and she is using BiPAP at night and oxygen during the daytime  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 CT head was negative  Continue use antibiotic for bilateral pneumonia  Nocturnal CPAP she never had any sleep study done  On 2 L nasal Cannula oxygen as salvage oxygen delivery device to provide high concentration of oxygen to overcome refractory hypoxia;   Continue with Bumex   Overnight pulse oximetry shows desaturation for 24 minutes qualify for home oxygen  Transfuse prn to maintain Hgb > 7  Labs to follow electrolytes, renal function and and blood counts  Bronchial hygiene with respiratory therapy techniques, bronchodilators  Pt needs IV fluids with additives and Drug therapy requiring intensive monitoring for toxicity  Prescription drug management with home med reconciliation reviewed  DVT, SUP prophylaxis   10/22   Discussed using home oxygen. pCO2 was 57 this morning and po2 was 78. Patient was awake and alert and  tolerating diet. Patient is doing better overall. Discussed using CPAP at night.     [x]  High complexity decision making was performed  [x]  See my orders for details    PMH:  has a past medical history of Anxiety, Depression, Gastrointestinal disorder, Heart failure (HCC), Hyperlipidemia, Hypertension, Liver disease, and Pneumonia.    PSH:   has a past surgical history that includes Cataract removal; egd transoral biopsy single/multiple (05/17/2009); Dilation and curettage of uterus (1999); Hysterectomy (2008); Cesarean section (1986 and 1993); Adenoidectomy (1972); Tonsillectomy (1972); back surgery; and Upper gastrointestinal endoscopy (N/A, 04/17/2024).     FHX: family history includes Cancer in her mother; Heart Disease in her father.     SHX:  reports that she quit smoking about 16 years ago. Her smoking use included cigarettes. She has never used smokeless tobacco. She reports that she does not drink alcohol and does not use drugs.    ALL:   Allergies   Allergen Reactions    Latex Rash        MEDS:   [x]  Reviewed - As Below   []  Not reviewed    Current Facility-Administered Medications   Medication Dose Route Frequency Provider Last Rate Last Admin    guaiFENesin  (MUCINEX ) extended release tablet 600 mg  600 mg Oral BID Gillard, Madison A, PA-C   600 mg at 06/11/24 0855    benzonatate  (TESSALON ) capsule 100 mg  100 mg Oral TID PRN Ronn Dixon A, PA-C   100 mg at 06/10/24 1217    doxycycline  (VIBRAMYCIN ) 100 mg in sodium chloride  0.9 %  100 mL IVPB (addEASE)  100 mg IntraVENous BID Minai, Omar A, MD 100 mL/hr at 06/11/24 0859 100 mg at 06/11/24 0859    ipratropium 0.5 mg-albuterol  2.5 mg (DUONEB ) nebulizer solution 1 Dose  1 Dose Inhalation Q6H WA RT Allauddin, Tahir, MD   1 Dose at 06/11/24 0847    influenza tiss-cult vaccine (FLUCELVAX) injection 0.5 mL  1 Dose IntraMUSCular Prior to discharge Aimee Elspeth SAUNDERS, MD        bumetanide  (BUMEX ) injection 1 mg  1 mg IntraVENous Daily Azeem, Ahad, MD   1 mg at  06/10/24 9173    metoprolol  tartrate (LOPRESSOR ) tablet 50 mg  50 mg Oral BID Azeem, Ahad, MD   50 mg at 06/11/24 0856    OLANZapine  (ZYPREXA ) tablet 10 mg  10 mg Oral Nightly Punyala, Srinivasa R, MD   10 mg at 06/11/24 0238    atorvastatin  (LIPITOR ) tablet 40 mg  40 mg Oral Nightly Azeem, Ahad, MD   40 mg at 06/10/24 2246    haloperidol  lactate (HALDOL ) injection 5 mg  5 mg IntraVENous Q6H PRN Azeem, Ahad, MD   5 mg at 06/08/24 0438    budesonide  (PULMICORT ) nebulizer suspension 500 mcg  0.5 mg Nebulization BID RT Nunna, Krishidhar R, MD   500 mcg at 06/11/24 9152    insulin  glargine (LANTUS ) injection vial 10 Units  10 Units SubCUTAneous Daily Nunna, Krishidhar R, MD   10 Units at 06/11/24 0856    ipratropium 0.5 mg-albuterol  2.5 mg (DUONEB ) nebulizer solution 1 Dose  1 Dose Inhalation Q4H PRN Nunna, Krishidhar R, MD   1 Dose at 06/08/24 0304    insulin  lispro (HUMALOG ,ADMELOG ) injection vial 0-8 Units  0-8 Units SubCUTAneous Q4H Nunna, Krishidhar R, MD   2 Units at 06/07/24 2042    0.9 % sodium chloride  infusion   IntraVENous PRN Nunna, Krishidhar R, MD        albuterol  (PROVENTIL ) nebulizer solution 2.5 mg  2.5 mg Nebulization Q4H PRN Nunna, Krishidhar R, MD        [Held by provider] tiotropium bromide  (SPIRIVA  RESPIMAT) 2.5 MCG/ACT inhaler 2 puff  2 puff Inhalation Daily RT Nunna, Krishidhar R, MD        [Held by provider] budesonide -formoterol  (SYMBICORT ) 160-4.5 MCG/ACT inhaler 2 puff  2 puff Inhalation BID RT Nunna, Krishidhar R, MD        hydrALAZINE  (APRESOLINE ) injection 10 mg  10 mg IntraVENous Q4H PRN Sahib, Taaliba A, APRN - NP   10 mg at 06/07/24 0202    cefTRIAXone  (ROCEPHIN ) 2,000 mg in sterile water  20 mL IV syringe  2,000 mg IntraVENous Q24H Gillard, Madison A, PA-C   2,000 mg at 06/11/24 0222    glucose chewable tablet 16 g  4 tablet Oral PRN Rawland Veleta SAUNDERS, MD        dextrose  bolus 10% 125 mL  125 mL IntraVENous PRN Nunna, Krishidhar R, MD        Or    dextrose  bolus 10% 250 mL  250 mL  IntraVENous PRN Nunna, Krishidhar R, MD        glucagon  injection 1 mg  1 mg SubCUTAneous PRN Nunna, Krishidhar R, MD        dextrose  10 % infusion   IntraVENous Continuous PRN Nunna, Krishidhar R, MD        sertraline  (ZOLOFT ) tablet 100 mg  100 mg Oral Daily Nunna, Krishidhar R, MD   100 mg at 06/11/24 0855    apixaban  (ELIQUIS ) tablet  5 mg  5 mg Per NG tube BID Nunna, Krishidhar R, MD   5 mg at 06/11/24 0855    sodium chloride  flush 0.9 % injection 5-40 mL  5-40 mL IntraVENous 2 times per day Marvis Lynwood HERO, APRN - CNP   10 mL at 06/10/24 2310    sodium chloride  flush 0.9 % injection 5-40 mL  5-40 mL IntraVENous PRN Marvis Lynwood HERO, APRN - CNP        0.9 % sodium chloride  infusion   IntraVENous PRN Marvis Lynwood HERO, APRN - CNP        lidocaine  1 % injection 50 mg  50 mg IntraDERmal Once Marvis Lynwood HERO, APRN - CNP        ALTEplase  (CATHFLO) injection 2 mg  2 mg IntraCATHeter PRN Marvis Lynwood HERO, APRN - CNP        sodium chloride  flush 0.9 % injection 5-40 mL  5-40 mL IntraVENous PRN Marvis Lynwood HERO, APRN - CNP        0.9 % sodium chloride  infusion   IntraVENous PRN Marvis Lynwood HERO, APRN - CNP   Stopped at 06/05/24 2344    potassium chloride  20 mEq/50 mL IVPB (Central Line)  20 mEq IntraVENous PRN Marvis Lynwood HERO, APRN - CNP        Or    potassium chloride  10 mEq/100 mL IVPB (Peripheral Line)  10 mEq IntraVENous PRN Marvis Lynwood HERO, APRN - CNP        magnesium  sulfate 2000 mg in 50 mL IVPB premix  2,000 mg IntraVENous PRN Marvis Lynwood HERO, APRN - CNP        ondansetron  (ZOFRAN -ODT) disintegrating tablet 4 mg  4 mg Oral Q8H PRN Marvis Lynwood HERO, APRN - CNP        Or    ondansetron  (ZOFRAN ) injection 4 mg  4 mg IntraVENous Q6H PRN Marvis Lynwood HERO, APRN - CNP   4 mg at 06/07/24 1021    polyethylene glycol (GLYCOLAX ) packet 17 g  17 g Oral Daily PRN Marvis Lynwood HERO, APRN - CNP        acetaminophen  (TYLENOL ) tablet 650 mg  650 mg Oral Q6H PRN Marvis Lynwood HERO, APRN - CNP   650 mg at 06/10/24 1508    Or    acetaminophen  (TYLENOL ) suppository 650 mg   650 mg Rectal Q6H PRN Marvis Lynwood HERO, APRN - CNP            MAR reviewed and pertinent medications noted or modified as needed      MND:Ezmupwzwu items are noted in HPI.      Hemodynamics:    CO:    CI:    CVP:    SVR:   PAP Systolic:    PAP Diastolic:    PVR:    SV02:        Ventilator Settings:      Mode Rate TV Press PEEP FiO2 PIP Min. Vent   (S) CPAP/PS 16 bpm  380 mL    6 30 %  13 cmH2O           Vital Signs: Telemetry:    AFIB Intake/Output:   BP 132/60   Pulse 68   Temp 98.1 F (36.7 C) (Oral)   Resp 18   Ht 1.575 m (5' 2)   Wt 112.2 kg (247 lb 5.7 oz)   SpO2 95%   BMI 45.23 kg/m     Temp (24hrs), Avg:98.2 F (36.8 C), Min:97.7 F (  36.5 C), Max:98.8 F (37.1 C)        O2 Device: Nasal cannula O2 Flow Rate (L/min): 3 L/min       Wt Readings from Last 4 Encounters:   06/10/24 112.2 kg (247 lb 5.7 oz)   06/06/24 126.6 kg (279 lb 1.6 oz)   05/03/24 129.7 kg (286 lb)   05/05/24 129.7 kg (286 lb)          Intake/Output Summary (Last 24 hours) at 06/11/2024 0908  Last data filed at 06/10/2024 1634  Gross per 24 hour   Intake 96.87 ml   Output 1200 ml   Net -1103.13 ml       Last shift:      No intake/output data recorded.  Last 3 shifts: 10/20 1901 - 10/22 0700  In: 156.9 [P.O.:50; I.V.:10]  Out: 1300 [Urine:1300]       Physical Exam:     General: Caucasian female; alert awake on oxygen via nasal cannula CPAP at night  HEENT: NCAT, poor dentition, lips and mucosa dry  Eyes: anicteric; conjunctiva clear  Neck: no nodes, trach midline; no accessory MM use.  Chest: no deformity,  Cardiac: IR regular; no murmur;   Lungs: distant breath sounds; bilateral rales  Abd: soft, NT, hypoactive BS  Ext: Bilateral leg edema; no joint swelling; No clubbing  GU: NO foley, clear urine  Neuro: No focal deficit  Psych- no agitation, oriented to person;   Skin: warm, dry, no cyanosis;   Pulses: 1-2+ Bilateral pedal, radial  Capillary: brisk; pale      DATA:  No results found for this or any previous  visit.    05/02/24    ECHO (TTE) COMPLETE (PRN CONTRAST/BUBBLE/STRAIN/3D) 05/06/2024 10:52 AM (Final)    Interpretation Summary    Left Ventricle: Normal left ventricular systolic function with a visually estimated EF of 70 - 75%. Left ventricle size is normal. Normal wall thickness. Normal wall motion. Normal diastolic function.    Right Ventricle: Right ventricle is moderately dilated. Normal systolic function.    Right Atrium: Right atrium is mildly dilated.    Pericardium: Moderate (1-2 cm) localized pericardial effusion present around the left ventricle. Pericardial posterior effusion measures 1.0 cm.    Image quality is adequate. Contrast used: Lumason . Technically difficult study, technically difficult study with poor endocardial visualization, technically difficult study due to patient's body habitus and technically difficult study due to patient's heart rhythm.    Signed by: Laquetta Commander, MD on 05/06/2024 10:52 AM       MAR reviewed and pertinent medications noted or modified as needed    MEDS:   Current Facility-Administered Medications   Medication Dose Route Frequency Provider Last Rate Last Admin    guaiFENesin  (MUCINEX ) extended release tablet 600 mg  600 mg Oral BID Gillard, Madison A, PA-C   600 mg at 06/11/24 0855    benzonatate  (TESSALON ) capsule 100 mg  100 mg Oral TID PRN Ronn Dixon A, PA-C   100 mg at 06/10/24 1217    doxycycline  (VIBRAMYCIN ) 100 mg in sodium chloride  0.9 % 100 mL IVPB (addEASE)  100 mg IntraVENous BID Minai, Omar A, MD 100 mL/hr at 06/11/24 0859 100 mg at 06/11/24 0859    ipratropium 0.5 mg-albuterol  2.5 mg (DUONEB ) nebulizer solution 1 Dose  1 Dose Inhalation Q6H WA RT Allauddin, Kristi, MD   1 Dose at 06/11/24 0847    influenza tiss-cult vaccine (FLUCELVAX) injection 0.5 mL  1 Dose IntraMUSCular Prior to discharge Aimee Standing  R, MD        bumetanide  (BUMEX ) injection 1 mg  1 mg IntraVENous Daily Azeem, Ahad, MD   1 mg at 06/10/24 0826    metoprolol  tartrate  (LOPRESSOR ) tablet 50 mg  50 mg Oral BID Azeem, Ahad, MD   50 mg at 06/11/24 0856    OLANZapine  (ZYPREXA ) tablet 10 mg  10 mg Oral Nightly Punyala, Srinivasa R, MD   10 mg at 06/11/24 9761    atorvastatin  (LIPITOR ) tablet 40 mg  40 mg Oral Nightly Azeem, Ahad, MD   40 mg at 06/10/24 2246    haloperidol  lactate (HALDOL ) injection 5 mg  5 mg IntraVENous Q6H PRN Azeem, Ahad, MD   5 mg at 06/08/24 0438    budesonide  (PULMICORT ) nebulizer suspension 500 mcg  0.5 mg Nebulization BID RT Nunna, Krishidhar R, MD   500 mcg at 06/11/24 9152    insulin  glargine (LANTUS ) injection vial 10 Units  10 Units SubCUTAneous Daily Nunna, Krishidhar R, MD   10 Units at 06/11/24 0856    ipratropium 0.5 mg-albuterol  2.5 mg (DUONEB ) nebulizer solution 1 Dose  1 Dose Inhalation Q4H PRN Nunna, Krishidhar R, MD   1 Dose at 06/08/24 0304    insulin  lispro (HUMALOG ,ADMELOG ) injection vial 0-8 Units  0-8 Units SubCUTAneous Q4H Nunna, Krishidhar R, MD   2 Units at 06/07/24 2042    0.9 % sodium chloride  infusion   IntraVENous PRN Nunna, Krishidhar R, MD        albuterol  (PROVENTIL ) nebulizer solution 2.5 mg  2.5 mg Nebulization Q4H PRN Nunna, Krishidhar R, MD        [Held by provider] tiotropium bromide  (SPIRIVA  RESPIMAT) 2.5 MCG/ACT inhaler 2 puff  2 puff Inhalation Daily RT Nunna, Krishidhar R, MD        [Held by provider] budesonide -formoterol  (SYMBICORT ) 160-4.5 MCG/ACT inhaler 2 puff  2 puff Inhalation BID RT Nunna, Krishidhar R, MD        hydrALAZINE  (APRESOLINE ) injection 10 mg  10 mg IntraVENous Q4H PRN Sahib, Taaliba A, APRN - NP   10 mg at 06/07/24 0202    cefTRIAXone  (ROCEPHIN ) 2,000 mg in sterile water  20 mL IV syringe  2,000 mg IntraVENous Q24H Gillard, Madison A, PA-C   2,000 mg at 06/11/24 0222    glucose chewable tablet 16 g  4 tablet Oral PRN Rawland Veleta SAUNDERS, MD        dextrose  bolus 10% 125 mL  125 mL IntraVENous PRN Nunna, Krishidhar R, MD        Or    dextrose  bolus 10% 250 mL  250 mL IntraVENous PRN Nunna, Krishidhar R, MD         glucagon  injection 1 mg  1 mg SubCUTAneous PRN Nunna, Krishidhar R, MD        dextrose  10 % infusion   IntraVENous Continuous PRN Nunna, Krishidhar R, MD        sertraline  (ZOLOFT ) tablet 100 mg  100 mg Oral Daily Nunna, Krishidhar R, MD   100 mg at 06/11/24 0855    apixaban  (ELIQUIS ) tablet 5 mg  5 mg Per NG tube BID Nunna, Krishidhar R, MD   5 mg at 06/11/24 0855    sodium chloride  flush 0.9 % injection 5-40 mL  5-40 mL IntraVENous 2 times per day Marvis Lynwood HERO, APRN - CNP   10 mL at 06/10/24 2310    sodium chloride  flush 0.9 % injection 5-40 mL  5-40 mL IntraVENous PRN Marvis Lynwood HERO, APRN -  CNP        0.9 % sodium chloride  infusion   IntraVENous PRN Marvis Lynwood HERO, APRN - CNP        lidocaine  1 % injection 50 mg  50 mg IntraDERmal Once Marvis Lynwood HERO, APRN - CNP        ALTEplase  (CATHFLO) injection 2 mg  2 mg IntraCATHeter PRN Marvis Lynwood HERO, APRN - CNP        sodium chloride  flush 0.9 % injection 5-40 mL  5-40 mL IntraVENous PRN Marvis Lynwood HERO, APRN - CNP        0.9 % sodium chloride  infusion   IntraVENous PRN Marvis Lynwood HERO, APRN - CNP   Stopped at 06/05/24 2344    potassium chloride  20 mEq/50 mL IVPB (Central Line)  20 mEq IntraVENous PRN Marvis Lynwood HERO, APRN - CNP        Or    potassium chloride  10 mEq/100 mL IVPB (Peripheral Line)  10 mEq IntraVENous PRN Marvis Lynwood HERO, APRN - CNP        magnesium  sulfate 2000 mg in 50 mL IVPB premix  2,000 mg IntraVENous PRN Marvis Lynwood HERO, APRN - CNP        ondansetron  (ZOFRAN -ODT) disintegrating tablet 4 mg  4 mg Oral Q8H PRN Marvis Lynwood HERO, APRN - CNP        Or    ondansetron  (ZOFRAN ) injection 4 mg  4 mg IntraVENous Q6H PRN Marvis Lynwood HERO, APRN - CNP   4 mg at 06/07/24 1021    polyethylene glycol (GLYCOLAX ) packet 17 g  17 g Oral Daily PRN Marvis Lynwood HERO, APRN - CNP        acetaminophen  (TYLENOL ) tablet 650 mg  650 mg Oral Q6H PRN Marvis Lynwood HERO, APRN - CNP   650 mg at 06/10/24 1508    Or    acetaminophen  (TYLENOL ) suppository 650 mg  650 mg Rectal Q6H PRN Marvis Lynwood HERO, APRN  - CNP            ARTERIAL BLOOD GAS  Recent Labs     06/11/24  0411   PHART 7.40   PCO2ART 57*   PO2ART 78*   HCO3ART 34*   BEART 8.0*   FIO2A 32   O2SATART 95   OXYHEM 93.4*   CARBOXHGBART 0.9*   METHGBART 0.4   TEMP 98.4     Labs:    Recent Labs     06/09/24  0200 06/10/24  0300   WBC 6.5 7.9   HGB 10.1* 10.1*   PLT 132* 103*     Recent Labs     06/09/24  0200 06/10/24  0300   NA 142 141   K 3.8 3.8   CL 102 100   CO2 34* 33*   GLUCOSE 131* 89   BUN 20 22   CREATININE 0.66 0.63   CALCIUM  8.3* 8.2*   MG 2.0  --    PHOS 2.6  --    BILITOT 1.0 0.8   AST 33 31   ALT 21 16     No results for input(s): CKTOTAL, CKMB, CKMBINDEX, TROPONINI in the last 72 hours.  Lab Results   Component Value Date/Time    PROBNP 11,914 06/03/2024 05:54 PM      Lab Results   Component Value Date/Time    TSH 2.400 06/09/2024 01:50 PM      Results       Procedure Component Value Units Date/Time  Culture, Blood 1 [[id number]] Collected: 06/09/24 1420    Order Status: Sent Specimen: Blood Updated: 06/09/24 1451    Legionella antigen, urine [[id number]] Collected: 06/04/24 1228    Order Status: Completed Specimen: Urine Updated: 06/06/24 1636     Sample Site Urine        L pneumophila S1 Ag, Ur Negative        Comment: Presumptive negative for L. pneumophila serogroup 1 antigen in urine,  suggesting no recent or current infection. Legionnaires' disease  cannot be ruled out since other serogroups and species may also  cause disease.  Performed At: [facility]  [address] [place], [place] [id number]  Jennette Shorter MD Ey:[phone]         S. pneumonia Antigen, Urine/CSF [[id number]] Collected: 06/04/24 1228    Order Status: Completed Specimen: Urine Updated: 06/06/24 1636     Sample Site Urine        Specimen Urine     S pneumoniae Ag, CSF Negative        FLUID CULTURE Not indicated.     Organism Id Not indicated.     Please note: Comment        Comment: College of [employer]ologists standards require a culture to  be  performed on CSF specimens submitted for bacterial antigen testing.  (CAP [id number]) Urine specimens will not be cultured.  Performed At: [facility]  [address] [place], [place] [id number]  Jennette Shorter MD Ey:[phone]         COVID-19 & Influenza Combo [[id number]] Collected: 06/04/24 0845    Order Status: Completed Specimen: Nasopharynx Updated: 06/04/24 0934     SARS-CoV-2, PCR Not Detected        Comment: Not Detected results do not preclude SARS-CoV-2 infection and should not be used as the sole basis for patient management decisions. Results must be combined with clinical observations, patient history, and epidemiological information        Rapid Influenza A By PCR Not Detected        Rapid Influenza B By PCR Not Detected        Comment:    Testing was performed using cobas Liat SARS-CoV-2 and Influenza A/B nucleic acid assay.  This test is a multiplex Real-Time Reverse Transcriptas         Culture, Blood 2 [[id number]]  (Abnormal) Collected: 06/04/24 0300    Order Status: Completed Specimen: Blood Updated: 06/06/24 0836     Special Requests No Special Requests        Culture       Staphylococcus species, coagulase negative growing in 1 of 2 bottles drawn No Site Indicated                  This isolate represents a common contaminant and may or may not be clinically significant. Please notify the laboratory if further work is indicated.            (NOTE) CALLED TO ;CAMBPELL IRBY [email or web address] .AM    Culture, Blood, PCR ID Panel [[id number]]  (Abnormal) Collected: 06/04/24 0300    Order Status: Completed Specimen: Blood Updated: 06/05/24 1622     Accession Number Blood        Enterococcus faecalis by PCR Not detected     Enterococcus faecium by PCR Not detected     Listeria monocytogenes by PCR Not detected     STAPHYLOCOCCUS Detected        Staphylococcus Aureus Not detected     Staphylococcus epidermidis  by PCR Detected        Staphylococcus lugdunensis by PCR Not detected     STREPTOCOCCUS Not  detected     Streptococcus agalactiae (Group B) Not detected     Strep pneumoniae Not detected     Strep pyogenes,(Grp. A) Not detected     Acinetobacter calcoac baumannii complex by PCR Not detected     Bacteroides fragilis by PCR Not detected     Enterobacteriaceae by PCR Not detected     Enterobacter cloacae complex by PCR Not detected     Escherichia Coli Not detected     Klebsiella aerogenes by PCR Not detected     Klebsiella oxytoca by PCR Not detected     Klebsiella pneumoniae group by PCR Not detected     Proteus by PCR Not detected     Salmonella species by PCR Not detected     Serratia marcescens by PCR Not detected     Haemophilus Influenzae by PCR Not detected     Neisseria meningitidis by PCR Not detected     Pseudomonas aeruginosa Not detected     Stenotrophomonas maltophilia by PCR Not detected     Candida albicans by PCR Not detected     Candida auris by PCR Not detected     Candida glabrata Not detected     Candida krusei by PCR Not detected     Candida parapsilosis by PCR Not detected     Candida tropicalis by PCR Not detected     Cryptococcus neoformans/gattii by PCR Not detected     Resistant gene targets          Methicillin Resistance mecA/C  by PCR Detected        [employer] test comment False positive results may rarely occur. Correlate with     Comment: clinical,epidemiologic,  and other laboratory findings  Please see BCID Interpretation Guide in EPIC Links         MRSA by PCR [[id number]] Collected: 06/04/24 0215    Order Status: Completed Specimen: Swab Updated: 06/04/24 0443     MRSA by PCR Not Detected       Culture, C Auris Screen [[id number]] Collected: 06/04/24 0215    Order Status: Completed Specimen: Swab Updated: 06/07/24 1449     Special Requests No Special Requests        Culture No Candida auris present       Culture, Blood 1 [[id number]] Collected: 06/04/24 0124    Order Status: Completed Specimen: Blood Updated: 06/10/24 1431     Special Requests No Special Requests         Culture No growth 6 days       Culture, Urine [[id number]] Collected: 06/03/24 2204    Order Status: Completed Specimen: Urine Updated: 06/05/24 0811     Special Requests --        No Special Requests  Reflexed from [id number]       Culture No Growth (<1000 cfu/mL)              Imaging:  XR CHEST PORTABLE   Final Result      Persistent but improved diffuse bilateral pulmonary infiltrates.         Electronically signed by DARICE COLON      CTA HEAD NECK W CONTRAST   Final Result         1. No acute large vessel arterial occlusion. Mild to moderate atherosclerotic   changes.   2. Pulmonary hypertension.  3. Biapical airspace disease likely infectious/inflammatory.               Electronically signed by Aletha CHRISTELLA Ham      CT BRAIN PERFUSION   Final Result         1. No acute large vessel arterial occlusion. Mild to moderate atherosclerotic   changes.   2. Pulmonary hypertension.   3. Biapical airspace disease likely infectious/inflammatory.               Electronically signed by Aletha CHRISTELLA Ham      CT HEAD WO CONTRAST   Final Result   No acute intracranial findings.         Electronically signed by PAULINE BLANCH      XR CHEST PORTABLE   Final Result   1. No change bilateral pneumonia         Electronically signed by Evalene JINNY Hoit      CT HEAD WO CONTRAST   Final Result      No acute intracranial abnormality.         Electronically signed by Toribio JINNY Musick      CTA CHEST W WO CONTRAST   Final Result   Extensive bilateral pulmonary infiltrates. No evidence of pulmonary embolism.         Electronically signed by DARICE COLON      XR CHEST PORTABLE   Final Result   1. Enlarged cardiac silhouette, worsening airspace disease or pulmonary edema.   No complicating features post intubation            Electronically signed by Susie Brain      XR CHEST PORTABLE   Final Result   Cardiomegaly and mild interstitial pulmonary edema.             Electronically signed by JIMMY HABIB          No results found.     This care involved  high complexity decision making which includes independently reviewing the patient's past medical records, current laboratory results, medication profiles that were immediately available to me and actual Xray images at the bedside in order to assess, support vital system function, and to treat this degree of vital organ system failure, and to prevent further life threatening deterioration of the patients condition.     Medical Decision Making Today  Reviewed the flowsheet and previous days notes  Reviewed and summarized records or history from previous days note or discussions with staff, family  Parenteral controlled substances - Reviewed/ Adjusted / Weaned / Started  High Risk Drug therapy requiring intensive monitoring for toxicity: eg steroids, pressors, antibiotics  Review and order of Clinical lab tests  Review and Order of Radiology tests  Review and Order of Medicine tests  Independent visualization of radiologic Images  Reviewed Ventilator / NiPPV  I have personally reviewed the patients ECG / Telemetry  Diagnostic endoscopies with identified risk factors        I have provided total of 55 minutes of critical care time rendering care exclusive of any procedures. During this entire length of time the patient's condition was unstable, unpredictable and critically ill in the CCU/ ICU. I was immediately available to the patient whose care required several interactions with nursing, multidisciplinary team members leading to multiple interventions with fluid resuscitation and medication adjustments to optimize respiratory support, hemodynamic treatment, medication changes based on repeat labs results, reviews, exams and assessments. The reason for providing this level of medical care was  due to a critical illness that impaired one or more vital organ systems, such that there was a high probability of sudden or life threatening deterioration in the patient's condition.   "

## 2024-06-11 NOTE — Plan of Care (Signed)
"    Problem: Chronic Conditions and Co-morbidities  Goal: Patient's chronic conditions and co-morbidity symptoms are monitored and maintained or improved  Outcome: Progressing     Problem: Discharge Planning  Goal: Discharge to home or other facility with appropriate resources  Outcome: Progressing     Problem: Neurosensory - Adult  Goal: Achieves stable or improved neurological status  Outcome: Progressing  Goal: Absence of seizures  Outcome: Progressing  Goal: Achieves maximal functionality and self care  Outcome: Progressing     Problem: Respiratory - Adult  Goal: Achieves optimal ventilation and oxygenation  Outcome: Progressing     Problem: Cardiovascular - Adult  Goal: Maintains optimal cardiac output and hemodynamic stability  Outcome: Progressing  Goal: Absence of cardiac dysrhythmias or at baseline  Outcome: Progressing     Problem: Skin/Tissue Integrity - Adult  Goal: Skin integrity remains intact  Description: 1.  Monitor for areas of redness and/or skin breakdown  2.  Assess vascular access sites hourly  3.  Every 4-6 hours minimum:  Change oxygen saturation probe site  4.  Every 4-6 hours:  If on nasal continuous positive airway pressure, respiratory therapy assess nares and determine need for appliance change or resting period  Outcome: Progressing  Goal: Incisions, wounds, or drain sites healing without S/S of infection  Outcome: Progressing  Goal: Oral mucous membranes remain intact  Outcome: Progressing     Problem: Musculoskeletal - Adult  Goal: Return mobility to safest level of function  Outcome: Progressing  Goal: Maintain proper alignment of affected body part  Outcome: Progressing  Goal: Return ADL status to a safe level of function  Outcome: Progressing     Problem: Gastrointestinal - Adult  Goal: Minimal or absence of nausea and vomiting  Outcome: Progressing  Goal: Maintains or returns to baseline bowel function  Outcome: Progressing  Goal: Maintains adequate nutritional intake  Outcome:  Progressing  Goal: Establish and maintain optimal ostomy function  Outcome: Progressing     Problem: Genitourinary - Adult  Goal: Absence of urinary retention  Outcome: Progressing  Goal: Urinary catheter remains patent  Outcome: Progressing     "

## 2024-06-11 NOTE — Plan of Care (Signed)
"    Problem: Nutrition Deficit:  Goal: Optimize nutritional status  06/11/2024 1227 by Hope Fitch, RD  Flowsheets (Taken 06/09/2024 1011 by Levonne Nest, RD)  Nutrient intake appropriate for improving, restoring, or maintaining nutritional needs:   Assess nutritional status and recommend course of action   Provide specific nutrition education to patient or family as appropriate     "

## 2024-06-11 NOTE — Progress Notes (Signed)
 "      Hospitalist Progress Note    NAME:   Breanna Lester   DOB: 1961-04-27   MRN: [id number]     Date/Time: 06/11/2024 8:57 AM  Patient PCP: Unknown, Provider    Estimated discharge date: 24h  Barriers: psych/pulm clearance, repeat blood culture, possible placement    Hospital Course:  Breanna Lester is a 63 y.o.  female with PMHx significant for chronic hypoxic respiratory failure on 3 L nasal cannula secondary to COPD, anxiety, depression, HTN, A-fib on Eliquis , T2DM, pancreatitis and EtOH cirrhosis who presented to ED on 06/03/2024 for worsening dyspnea.  In ED, patient severely hypoxic with increased work of breathing which was refractory to BiPAP, requiring intubation.  CTA chest shows extensive bilateral pulmonary infiltrates concerning for severe bilateral pneumonia, however negative for PE.  Started on empiric IV ceftriaxone  and azithromycin .  Respiratory status improved and patient was able to be extubated to BiPAP and further decreased to home dose 3 L NC.  However, patient remained confused so psychiatry was consulted.  Started on Zoloft  and Zyprexa .  Stroke alert was called on 10/17 for left-sided weakness with complete left hemianopia.  CT head, CTA head/neck and CT brain perfusion with no acute findings.  Patient unable to tolerate MRI.  Neurology evaluated, consider EEG if clinical status worsening otherwise no further stroke work up.  Transferred to medical service on 10/20.  Pt with increased work of breathing and lethargy on 10/21 with repeat ABG showing worsening hypercapnia of 60.  Started on nightly BiPAP with significant improvement in breathing and normalization of mental status.  Now stable on home dose 3 L nasal cannula.  Pulmonology consulted.  Patient may need BiPAP at discharge due to persistent hypercapnia.  PT/OT recommending rehab.    Assessment / Plan:  Severe bilateral CAP  COPD  Hypercapnia  Acute on chronic hypoxic respiratory failure 2/2 above  Initially requiring mechanical  ventilation, now on home dose 3L NC  Wean O2 as tolerated to maintain sats >92%  Bicarb 34 > 33  Pro-Cal only very minimally elevated and trending down, no leukocytosis  MRSA, Legionella and S pneumo negative  Continue empiric ceftriaxone , completed azithromycin    Continue scheduled DuoNebs and Mucinex   Overnight pulse oximetry   Repeat ABG 10/22 mildly improved but still with persistent hypercapnia, showed pH 7.4, pCO2 57, bicarb 24  Started on nightly BiPAP with significant improvement in respiratory and mental status - may need BiPAP at discharge  Pulm consulted    Concern for bacteremia  1/2 bottles positive for Staphylococcus epidermidis  PCR also identified methicillin-resistant mecA/C  Repeat blood culture in process     Acute metabolic encephalopathy, resolved  Polysubstance abuse  Multifactorial in the setting of acute infection, polysubstance abuse and hypercapnia   UDS positive for amphetamines  Ammonia and TSH/T4 WNL  CTH, CTA head/neck, CT brain perfusion negative for CVA  Neurology evaluated, recommended EEG if clinical status worsening  Psychiatry following, continue Zoloft  and Zyprexa      A-fib  Currently rate controlled  Continue Eliquis  and metoprolol     Microcytic anemia  Thrombocytopenia, mild  Platelets mildly decreased likely secondary to cirrhosis  Hgb stable around 10 with decreased MCH and MCHC  Check iron panel, B12 and folate  Monitor daily CBC and for any bleeding     OSA  Nightly BiPAP as above        Medical Decision Making     [x]  High (any 2)     A.  Problems (any 1)  [x]  Acute/Chronic Illness/injury posing threat to life or bodily function:    []  Severe exacerbation of chronic illness:    ---------------------------------------------------------------------  B. Risk of Treatment (any 1)   [x]  Drugs/treatments that require intensive monitoring for toxicity include:    [x]  IV ABX requiring serial renal monitoring for nephrotoxicity:     []  IV Narcotic analgesia for adverse drug  reaction  []  Aggressive IV diuresis requiring serial monitoring for renal impairment and electrolyte derangements  []  Critical electrolyte abnormalities requiring IV replacement and close serial monitoring  []  Insulin  - monitoring serial FSBS for Hypoglycemic adverse drug reaction  []  Other -   []  Change in code status:    []  Decision to escalate care:    []  Major surgery/procedure with associated risk factors:     ----------------------------------------------------------------------  C. Data (any 2)  [x]  Discussed current management and discharge planning options with Case Management.  [x]  Discussed management of the case with: Patient, RN  []  Telemetry personally reviewed and interpreted as documented above    []  Imaging personally reviewed and interpreted, includes: CXR  [x]  Data Review (any 3)  [x]  All available Consultant notes from yesterday/today were reviewed  [x]  All current labs were reviewed and interpreted for clinical significance   [x]  Appropriate follow-up labs were ordered  []  Collateral history obtained from:           Code Status: Full  DVT Prophylaxis: Eliquis   GI Prophylaxis: Not indicated    Subjective:     Chief Complaint / Reason for Physician Visit  Chart reviewed and discussed with RN events overnight.  Patient seen at bedside, sitting on side of bed.  Mental status has now normalized.  Denies chest pain or shortness of breath.  Does have cough, unable to expectorate.  Discussed possible need for BiPAP at discharge.  Also discussed PT recommendation for rehab which patient is interested in.  All patient questions answered.    Objective:     VITALS:   Last 24hrs VS reviewed since prior progress note. Most recent are:  Patient Vitals for the past 24 hrs:   BP Temp Temp src Pulse Resp SpO2   06/11/24 0847 -- -- -- -- -- 95 %   06/11/24 0845 132/60 98.1 F (36.7 C) Oral 68 18 95 %   06/11/24 0630 107/62 98.2 F (36.8 C) Oral 63 18 --   06/11/24 0356 -- -- -- -- -- 97 %   06/11/24 0115 (!)  123/54 98.4 F (36.9 C) Axillary 55 18 98 %   06/10/24 2305 -- -- -- -- -- 95 %   06/10/24 2014 95/60 97.7 F (36.5 C) Oral 54 16 98 %   06/10/24 1936 -- -- -- -- -- 96 %   06/10/24 1843 124/68 98.8 F (37.1 C) Oral 61 18 96 %   06/10/24 1800 (!) 111/48 -- -- 60 24 94 %   06/10/24 1700 (!) 114/45 -- -- 56 17 97 %   06/10/24 1636 (!) 112/57 98.2 F (36.8 C) Oral 61 25 98 %   06/10/24 1600 (!) 112/57 98.2 F (36.8 C) Oral 63 20 95 %   06/10/24 1500 -- -- -- 62 22 100 %   06/10/24 1400 117/64 -- -- 60 27 100 %   06/10/24 1300 111/79 -- -- 64 17 94 %   06/10/24 1200 104/80 97.7 F (36.5 C) Oral 58 18 100 %   06/10/24 1100 (!) 140/81 -- -- 67 30 98 %  06/10/24 1000 -- -- -- 57 22 98 %   06/10/24 0903 -- -- -- -- -- 100 %   06/10/24 0900 -- -- -- 63 25 100 %         Intake/Output Summary (Last 24 hours) at 06/11/2024 0857  Last data filed at 06/10/2024 1634  Gross per 24 hour   Intake 96.87 ml   Output 1200 ml   Net -1103.13 ml        I had a face to face encounter and independently examined this patient on 06/11/2024, as outlined below:    Review of Systems   Constitutional:  Positive for fatigue. Negative for chills and fever.   Respiratory:  Positive for cough. Negative for shortness of breath and wheezing.    Cardiovascular: Negative.  Negative for chest pain, palpitations and leg swelling.   Gastrointestinal:  Negative for abdominal pain, nausea and vomiting.   Genitourinary:  Negative for difficulty urinating and dysuria.   Neurological:  Negative for dizziness and light-headedness.   Psychiatric/Behavioral:  Negative for agitation and confusion.         PHYSICAL EXAM:  Physical Exam  Vitals and nursing note reviewed.   Constitutional:       Appearance: She is obese.   Cardiovascular:      Rate and Rhythm: Normal rate and regular rhythm.      Heart sounds: Normal heart sounds.   Pulmonary:      Effort: Pulmonary effort is normal. No respiratory distress.      Breath sounds: No wheezing.      Comments: 3L  NC  Diminished breath sounds bilaterally  Abdominal:      General: Abdomen is flat. There is no distension.      Palpations: Abdomen is soft.      Tenderness: There is no abdominal tenderness.      Hernia: A hernia (umbilical) is present.   Musculoskeletal:      Right lower leg: No edema.      Left lower leg: No edema.   Skin:     General: Skin is warm and dry.   Neurological:      General: No focal deficit present.      Mental Status: She is alert and oriented to person, place, and time. Mental status is at baseline.          Reviewed most current lab test results and cultures  YES  Reviewed most current radiology test results   YES  Review and summation of old records today    NO  Reviewed patient's current orders and MAR    YES  PMH/SH reviewed - no change compared to H&P  ________________________________________________________________________  Care Plan discussed with:    Comments   Patient x    Family      RN x    Care Manager     Consultant                        Multidiciplinary team rounds were held today with case manager, nursing, pharmacist and higher education careers adviser.  Patient's plan of care was discussed; medications were reviewed and discharge planning was addressed.     ________________________________________________________________________  Total NON critical care TIME:  35  Minutes    Total CRITICAL CARE TIME Spent:   Minutes non procedure based      Comments   >50% of visit spent in counseling and coordination of care     ________________________________________________________________________  Lum  A Nike Southwell, PA-C     Procedures: see electronic medical records for all procedures/Xrays and details which were not copied into this note but were reviewed prior to creation of Plan.      LABS:  I reviewed today's most current labs and imaging studies.  Pertinent labs include:  Recent Labs     06/09/24  0200 06/10/24  0300   WBC 6.5 7.9   HGB 10.1* 10.1*   HCT 34.7* 34.9*   PLT 132* 103*     Recent  Labs     06/09/24  0200 06/10/24  0300   NA 142 141   K 3.8 3.8   CL 102 100   CO2 34* 33*   BUN 20 22   MG 2.0  --    PHOS 2.6  --    ALT 21 16       Signed: Leilanni Halvorson DELENA Neth, PA-C   "

## 2024-06-12 LAB — CBC
Hematocrit: 41.7 % (ref 35.0–47.0)
Hemoglobin: 11.8 g/dL (ref 11.5–16.0)
MCH: 25.1 pg — ABNORMAL LOW (ref 26.0–34.0)
MCHC: 28.3 g/dL — ABNORMAL LOW (ref 30.0–36.5)
MCV: 88.5 FL (ref 80.0–99.0)
MPV: 12.1 FL (ref 8.9–12.9)
Nucleated RBCs: 0 /100{WBCs}
Platelets: 103 K/uL — ABNORMAL LOW (ref 150–400)
RBC: 4.71 M/uL (ref 3.80–5.20)
RDW: 17 % — ABNORMAL HIGH (ref 11.5–14.5)
WBC: 6.8 K/uL (ref 3.6–11.0)
nRBC: 0 K/uL (ref 0.00–0.01)

## 2024-06-12 LAB — POCT GLUCOSE
POC Glucose: 69 mg/dL (ref 65–100)
POC Glucose: 88 mg/dL (ref 65–100)
POC Glucose: 121 mg/dL — ABNORMAL HIGH (ref 65–100)
POC Glucose: 84 mg/dL (ref 65–100)
POC Glucose: 154 mg/dL — ABNORMAL HIGH (ref 65–100)

## 2024-06-12 LAB — BASIC METABOLIC PANEL
Anion Gap: 12 mmol/L (ref 2–14)
BUN/Creatinine Ratio: 24 — ABNORMAL HIGH (ref 12–20)
BUN: 17 mg/dL (ref 8–23)
CO2: 26 mmol/L (ref 20–29)
Calcium: 8.5 mg/dL — ABNORMAL LOW (ref 8.8–10.2)
Chloride: 103 mmol/L (ref 98–107)
Creatinine: 0.69 mg/dL (ref 0.60–1.00)
Est, Glom Filt Rate: >90 ml/min/1.73m2 (ref >59)
Glucose: 102 mg/dL — ABNORMAL HIGH (ref 65–100)
Potassium: 3.7 mmol/L (ref 3.5–5.1)
Sodium: 140 mmol/L (ref 136–145)

## 2024-06-12 LAB — PROCALCITONIN: Procalcitonin: 0.09 ng/mL

## 2024-06-12 MED ORDER — IPRATROPIUM-ALBUTEROL 0.5-2.5 (3) MG/3ML IN SOLN
0.5-2.5 | Freq: Two times a day (BID) | RESPIRATORY_TRACT | Status: DC
Start: 2024-06-12 — End: 2024-06-16
  Administered 2024-06-12 – 2024-06-16 (×9): 1 via RESPIRATORY_TRACT

## 2024-06-12 MED ORDER — ATORVASTATIN CALCIUM 40 MG PO TABS
40 | ORAL_TABLET | Freq: Every evening | ORAL | 0 refills | 90.00000 days | Status: DC
Start: 2024-06-12 — End: 2024-08-06

## 2024-06-12 MED ORDER — BUMETANIDE 1 MG PO TABS
1 | ORAL_TABLET | Freq: Every day | ORAL | 0 refills | 30.00000 days | Status: DC
Start: 2024-06-12 — End: 2024-08-06

## 2024-06-12 MED ORDER — TRELEGY ELLIPTA 100-62.5-25 MCG/ACT IN AEPB
100-62.5-25 | Freq: Every day | RESPIRATORY_TRACT | 1 refills | 30.00000 days | Status: AC
Start: 2024-06-12 — End: 2024-07-12

## 2024-06-12 MED ORDER — OLANZAPINE 10 MG PO TABS
10 | ORAL_TABLET | Freq: Every evening | ORAL | 0 refills | 30.00000 days | Status: DC
Start: 2024-06-12 — End: 2024-08-06

## 2024-06-12 MED ORDER — METOPROLOL TARTRATE 50 MG PO TABS
50 | ORAL_TABLET | Freq: Two times a day (BID) | ORAL | 0 refills | 90.00000 days | Status: DC
Start: 2024-06-12 — End: 2024-08-06

## 2024-06-12 MED ORDER — LEVOFLOXACIN 750 MG PO TABS
750 | ORAL_TABLET | Freq: Every day | ORAL | 0 refills | 5.00000 days | Status: AC
Start: 2024-06-12 — End: 2024-06-17

## 2024-06-12 MED FILL — MUCUS RELIEF ER 600 MG PO TB12: 600 mg | ORAL | Qty: 1

## 2024-06-12 MED FILL — BUMETANIDE 0.25 MG/ML IJ SOLN: 0.25 mg/mL | INTRAMUSCULAR | Qty: 4 | Fill #0

## 2024-06-12 MED FILL — INSULIN LISPRO 100 UNIT/ML IJ SOLN: 100 [IU]/mL | INTRAMUSCULAR | Qty: 1

## 2024-06-12 MED FILL — ELIQUIS 5 MG PO TABS: 5 mg | ORAL | Qty: 1 | Fill #0

## 2024-06-12 MED FILL — TRUEPLUS GLUCOSE 4 G PO CHEW: 4 g | ORAL | Qty: 4 | Fill #0

## 2024-06-12 MED FILL — SERTRALINE HCL 50 MG PO TABS: 50 mg | ORAL | Qty: 2 | Fill #0

## 2024-06-12 MED FILL — IPRATROPIUM-ALBUTEROL 0.5-2.5 (3) MG/3ML IN SOLN: 0.5-2.5 (3) MG/3ML | RESPIRATORY_TRACT | Qty: 3

## 2024-06-12 MED FILL — DOXYCYCLINE HYCLATE 100 MG IV SOLR: 100 mg | INTRAVENOUS | Qty: 100

## 2024-06-12 MED FILL — BUDESONIDE 0.5 MG/2ML IN SUSP: 0.5 MG/2ML | RESPIRATORY_TRACT | Qty: 2

## 2024-06-12 MED FILL — LANTUS 100 UNIT/ML SC SOLN: 100 [IU]/mL | SUBCUTANEOUS | Qty: 10 | Fill #0

## 2024-06-12 MED FILL — CEFTRIAXONE SODIUM 2 G IJ SOLR: 2 g | INTRAMUSCULAR | Qty: 2000 | Fill #0

## 2024-06-12 MED FILL — IPRATROPIUM-ALBUTEROL 0.5-2.5 (3) MG/3ML IN SOLN: 0.5-2.5 (3) MG/3ML | RESPIRATORY_TRACT | Qty: 3 | Fill #0

## 2024-06-12 MED FILL — BUDESONIDE 0.5 MG/2ML IN SUSP: 0.5 MG/2ML | RESPIRATORY_TRACT | Qty: 4 | Fill #0

## 2024-06-12 MED FILL — DOXYCYCLINE HYCLATE 100 MG IV SOLR: 100 mg | INTRAVENOUS | Qty: 100 | Fill #0

## 2024-06-12 MED FILL — METOPROLOL TARTRATE 50 MG PO TABS: 50 mg | ORAL | Qty: 1

## 2024-06-12 MED FILL — OLANZAPINE 10 MG PO TABS: 10 mg | ORAL | Qty: 1

## 2024-06-12 MED FILL — ELIQUIS 5 MG PO TABS: 5 mg | ORAL | Qty: 1

## 2024-06-12 MED FILL — ATORVASTATIN CALCIUM 40 MG PO TABS: 40 mg | ORAL | Qty: 1

## 2024-06-12 MED FILL — MUCUS RELIEF ER 600 MG PO TB12: 600 mg | ORAL | Qty: 1 | Fill #0

## 2024-06-12 MED FILL — METOPROLOL TARTRATE 50 MG PO TABS: 50 mg | ORAL | Qty: 1 | Fill #0

## 2024-06-12 MED FILL — BUDESONIDE 0.5 MG/2ML IN SUSP: 0.5 MG/2ML | RESPIRATORY_TRACT | Qty: 2 | Fill #0

## 2024-06-12 NOTE — Progress Notes (Signed)
 "  IMPRESSION:   Acute on chronic hypoxic respiratory failure  Chronic hypercapnic respiratory failure  Bilateral pneumonia improved  Cirrhosis of liver  Chronic A-fib  Additional workup outlined below      RECOMMENDATIONS/PLAN:   63 year old obese lady came in because of shortness of breath and dyspnea she is chronically on home oxygen she did not have any sleep study done in the past she has history of hypertension chronic A-fib history of EtOH cirrhosis of liver, patient also has COPD she was severely hypoxic and she was put on noninvasive ventilator CAT scan of the chest was done which showed bilateral pneumonia patient was intubated and successfully weaned off and she is using BiPAP at night and oxygen during the daytime  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 CT head was negative  Continue use antibiotic for bilateral pneumonia  Nocturnal CPAP she never had any sleep study done  Patient has had worsening respiratory failure and is at risk for serious harm or death without appropriate therapy at home which include noninvasive ventilator while patient has an underlying condition of OSA it is not the primary indication for need for NIV patient has previously tried CPAP and BiPAP and has failed to improve his hypercapnia and saturation level.  Since chronic/acute respiratory failure can be prevented with the use of positive pressure ventilation and due to the nature of the patient deteriorating condition a portable volume ventilator has been ordered via mask interface for use at night as well as during the day.  The patient requires continuous access to noninvasive positive pressure ventilation therapy to reduce respiratory distress, minimize hospitalization and to control carbon dioxide retention.  Noninvasive ventilator setting BiPAP at an IPAP of 15-20 cm H2O and EPAP of 3-5 cm H2O, gradually increasing pressures to target an oxygen saturation (SpO2) of 88-92% and improved pH and PaCO2.  Higher IPAP levels (up to 25-30 cm H2O) and higher EPAP may be needed for severe hypercapnia in acute-on-chronic respiratory failure.    On 2 L nasal Cannula oxygen as salvage oxygen delivery device to provide high concentration of oxygen to overcome refractory hypoxia;   Continue with Bumex   Overnight pulse oximetry shows desaturation for 24 minutes qualify for home oxygen  Transfuse prn to maintain Hgb > 7  Labs to follow electrolytes, renal function and and blood counts  Bronchial hygiene with respiratory therapy techniques, bronchodilators  Pt needs IV fluids with additives and Drug therapy requiring intensive monitoring for toxicity  Prescription drug management with home med reconciliation reviewed  DVT, SUP prophylaxis     10/22  Alert awake eating breakfast on oxygen via nasal cannula getting nebulizer treatment use the noninvasive ventilator BiPAP her pCO2 was 60 discussed with her about getting noninvasive ventilator will order one but issue is the compliance, she had a history of polysubstance abuse and will do 6-minute walk test before discharge to see if she qualifies for POC.  10/23  Patient seen and examined at the bedside getting nebulizer treatment RT was present in the room denies any chest pain shortness of breath awaiting for noninvasive ventilator which has been ordered discussed with her about her condition and using inhalers and compliance with the noninvasive ventilator once patient get the noninvasive ventilator she can be discharged patient getting nebulizer treatment also on doxycycline .  Culture positive most likely contamination continue with the Bumex .  Recommend abstinence from substance abuse.    [x]  High complexity decision making was performed  [x]  See my  orders for details    PMH:  has a past medical history of Anxiety, Depression, Gastrointestinal disorder, Heart failure (HCC), Hyperlipidemia, Hypertension, Liver disease, and Pneumonia.    PSH:   has a past surgical history that  includes Cataract removal; egd transoral biopsy single/multiple (05/17/2009); Dilation and curettage of uterus (1999); Hysterectomy (2008); Cesarean section (1986 and 1993); Adenoidectomy (1972); Tonsillectomy (1972); back surgery; and Upper gastrointestinal endoscopy (N/A, 04/17/2024).     FHX: family history includes Cancer in her mother; Heart Disease in her father.     SHX:  reports that she quit smoking about 16 years ago. Her smoking use included cigarettes. She has never used smokeless tobacco. She reports that she does not drink alcohol and does not use drugs.    ALL:   Allergies   Allergen Reactions    Latex Rash        MEDS:   [x]  Reviewed - As Below   []  Not reviewed    Current Facility-Administered Medications   Medication Dose Route Frequency Provider Last Rate Last Admin    ipratropium 0.5 mg-albuterol  2.5 mg (DUONEB ) nebulizer solution 1 Dose  1 Dose Inhalation BID RT Aimee Elspeth SAUNDERS, MD   1 Dose at 06/12/24 0803    guaiFENesin  (MUCINEX ) extended release tablet 600 mg  600 mg Oral BID Ronn Dixon A, PA-C   600 mg at 06/11/24 2046    benzonatate  (TESSALON ) capsule 100 mg  100 mg Oral TID PRN Ronn Dixon A, PA-C   100 mg at 06/10/24 1217    doxycycline  (VIBRAMYCIN ) 100 mg in sodium chloride  0.9 % 100 mL IVPB (addEASE)  100 mg IntraVENous BID Mauricio Mancel LABOR, MD   Stopped at 06/11/24 2148    influenza tiss-cult vaccine (FLUCELVAX) injection 0.5 mL  1 Dose IntraMUSCular Prior to discharge Aimee Elspeth SAUNDERS, MD        bumetanide  (BUMEX ) injection 1 mg  1 mg IntraVENous Daily Azeem, Ahad, MD   1 mg at 06/10/24 0826    metoprolol  tartrate (LOPRESSOR ) tablet 50 mg  50 mg Oral BID Azeem, Ahad, MD   50 mg at 06/11/24 0856    OLANZapine  (ZYPREXA ) tablet 10 mg  10 mg Oral Nightly Punyala, Srinivasa R, MD   10 mg at 06/11/24 2232    atorvastatin  (LIPITOR ) tablet 40 mg  40 mg Oral Nightly Azeem, Ahad, MD   40 mg at 06/11/24 2045    haloperidol  lactate (HALDOL ) injection 5 mg  5 mg IntraVENous Q6H PRN Azeem,  Ahad, MD   5 mg at 06/08/24 0438    budesonide  (PULMICORT ) nebulizer suspension 500 mcg  0.5 mg Nebulization BID RT Nunna, Krishidhar R, MD   500 mcg at 06/12/24 0803    insulin  glargine (LANTUS ) injection vial 10 Units  10 Units SubCUTAneous Daily Nunna, Krishidhar R, MD   10 Units at 06/11/24 0856    ipratropium 0.5 mg-albuterol  2.5 mg (DUONEB ) nebulizer solution 1 Dose  1 Dose Inhalation Q4H PRN Nunna, Krishidhar R, MD   1 Dose at 06/08/24 0304    insulin  lispro (HUMALOG ,ADMELOG ) injection vial 0-8 Units  0-8 Units SubCUTAneous Q4H Nunna, Krishidhar R, MD   2 Units at 06/07/24 2042    0.9 % sodium chloride  infusion   IntraVENous PRN Nunna, Krishidhar R, MD        albuterol  (PROVENTIL ) nebulizer solution 2.5 mg  2.5 mg Nebulization Q4H PRN Nunna, Krishidhar R, MD        [Held by provider] tiotropium bromide  (SPIRIVA  RESPIMAT)  2.5 MCG/ACT inhaler 2 puff  2 puff Inhalation Daily RT Nunna, Krishidhar R, MD        [Held by provider] budesonide -formoterol  (SYMBICORT ) 160-4.5 MCG/ACT inhaler 2 puff  2 puff Inhalation BID RT Nunna, Krishidhar R, MD        hydrALAZINE  (APRESOLINE ) injection 10 mg  10 mg IntraVENous Q4H PRN Sahib, Taaliba A, APRN - NP   10 mg at 06/07/24 0202    glucose chewable tablet 16 g  4 tablet Oral PRN Nunna, Krishidhar R, MD   16 g at 06/12/24 0245    dextrose  bolus 10% 125 mL  125 mL IntraVENous PRN Nunna, Krishidhar R, MD        Or    dextrose  bolus 10% 250 mL  250 mL IntraVENous PRN Nunna, Krishidhar R, MD        glucagon  injection 1 mg  1 mg SubCUTAneous PRN Nunna, Krishidhar R, MD        dextrose  10 % infusion   IntraVENous Continuous PRN Nunna, Krishidhar R, MD        sertraline  (ZOLOFT ) tablet 100 mg  100 mg Oral Daily Nunna, Krishidhar R, MD   100 mg at 06/11/24 0855    apixaban  (ELIQUIS ) tablet 5 mg  5 mg Per NG tube BID Nunna, Krishidhar R, MD   5 mg at 06/11/24 2046    sodium chloride  flush 0.9 % injection 5-40 mL  5-40 mL IntraVENous 2 times per day Marvis Lynwood HERO, APRN - CNP   10 mL at  06/11/24 2035    sodium chloride  flush 0.9 % injection 5-40 mL  5-40 mL IntraVENous PRN Marvis Lynwood HERO, APRN - CNP        0.9 % sodium chloride  infusion   IntraVENous PRN Marvis Lynwood HERO, APRN - CNP        lidocaine  1 % injection 50 mg  50 mg IntraDERmal Once Marvis Lynwood HERO, APRN - CNP        ALTEplase  (CATHFLO) injection 2 mg  2 mg IntraCATHeter PRN Marvis Lynwood HERO, APRN - CNP        sodium chloride  flush 0.9 % injection 5-40 mL  5-40 mL IntraVENous PRN Marvis Lynwood HERO, APRN - CNP        0.9 % sodium chloride  infusion   IntraVENous PRN Marvis Lynwood HERO, APRN - CNP   Stopped at 06/05/24 2344    potassium chloride  20 mEq/50 mL IVPB (Central Line)  20 mEq IntraVENous PRN Marvis Lynwood HERO, APRN - CNP        Or    potassium chloride  10 mEq/100 mL IVPB (Peripheral Line)  10 mEq IntraVENous PRN Marvis Lynwood HERO, APRN - CNP        magnesium  sulfate 2000 mg in 50 mL IVPB premix  2,000 mg IntraVENous PRN Marvis Lynwood HERO, APRN - CNP        ondansetron  (ZOFRAN -ODT) disintegrating tablet 4 mg  4 mg Oral Q8H PRN Marvis Lynwood HERO, APRN - CNP        Or    ondansetron  (ZOFRAN ) injection 4 mg  4 mg IntraVENous Q6H PRN Marvis Lynwood HERO, APRN - CNP   4 mg at 06/07/24 1021    polyethylene glycol (GLYCOLAX ) packet 17 g  17 g Oral Daily PRN Marvis Lynwood HERO, APRN - CNP        acetaminophen  (TYLENOL ) tablet 650 mg  650 mg Oral Q6H PRN Marvis Lynwood HERO, APRN - CNP   650 mg at  06/11/24 1442    Or    acetaminophen  (TYLENOL ) suppository 650 mg  650 mg Rectal Q6H PRN Marvis Lynwood HERO, APRN - CNP            MAR reviewed and pertinent medications noted or modified as needed      MND:Ezmupwzwu items are noted in HPI.      Hemodynamics:    CO:    CI:    CVP:    SVR:   PAP Systolic:    PAP Diastolic:    PVR:    SV02:        Ventilator Settings:      Mode Rate TV Press PEEP FiO2 PIP Min. Vent   (S) CPAP/PS 16 bpm  380 mL    6 30 %  13 cmH2O           Vital Signs: Telemetry:    AFIB Intake/Output:   BP 125/76   Pulse 63   Temp 98.2 F (36.8 C) (Oral)   Resp 20   Ht 1.575 m (5'  2)   Wt 112.2 kg (247 lb 5.7 oz)   SpO2 99%   BMI 45.23 kg/m     Temp (24hrs), Avg:98.4 F (36.9 C), Min:98.1 F (36.7 C), Max:98.8 F (37.1 C)        O2 Device: Nasal cannula O2 Flow Rate (L/min): 3 L/min       Wt Readings from Last 4 Encounters:   06/10/24 112.2 kg (247 lb 5.7 oz)   06/06/24 126.6 kg (279 lb 1.6 oz)   05/03/24 129.7 kg (286 lb)   05/05/24 129.7 kg (286 lb)          Intake/Output Summary (Last 24 hours) at 06/12/2024 0819  Last data filed at 06/12/2024 0525  Gross per 24 hour   Intake 10 ml   Output 400 ml   Net -390 ml       Last shift:      No intake/output data recorded.  Last 3 shifts: 10/21 1901 - 10/23 0700  In: 10 [I.V.:10]  Out: 400 [Urine:400]       Physical Exam:     General: Caucasian female; alert awake on oxygen via nasal cannula CPAP at night  HEENT: NCAT, poor dentition, lips and mucosa dry  Eyes: anicteric; conjunctiva clear  Neck: no nodes, trach midline; no accessory MM use.  Chest: no deformity,  Cardiac: IR regular; no murmur;   Lungs: distant breath sounds; bilateral rales  Abd: soft, NT, hypoactive BS  Ext: Bilateral leg edema; no joint swelling; No clubbing  GU: NO foley, clear urine  Neuro: No focal deficit  Psych- no agitation, oriented to person;   Skin: warm, dry, no cyanosis;   Pulses: 1-2+ Bilateral pedal, radial  Capillary: brisk; pale      DATA:  No results found for this or any previous visit.    05/02/24    ECHO (TTE) COMPLETE (PRN CONTRAST/BUBBLE/STRAIN/3D) 05/06/2024 10:52 AM (Final)    Interpretation Summary    Left Ventricle: Normal left ventricular systolic function with a visually estimated EF of 70 - 75%. Left ventricle size is normal. Normal wall thickness. Normal wall motion. Normal diastolic function.    Right Ventricle: Right ventricle is moderately dilated. Normal systolic function.    Right Atrium: Right atrium is mildly dilated.    Pericardium: Moderate (1-2 cm) localized pericardial effusion present around the left ventricle. Pericardial  posterior effusion measures 1.0 cm.    Image quality is adequate.  Contrast used: Lumason . Technically difficult study, technically difficult study with poor endocardial visualization, technically difficult study due to patient's body habitus and technically difficult study due to patient's heart rhythm.    Signed by: Laquetta Commander, MD on 05/06/2024 10:52 AM       MAR reviewed and pertinent medications noted or modified as needed    MEDS:   Current Facility-Administered Medications   Medication Dose Route Frequency Provider Last Rate Last Admin    ipratropium 0.5 mg-albuterol  2.5 mg (DUONEB ) nebulizer solution 1 Dose  1 Dose Inhalation BID RT Aimee Elspeth SAUNDERS, MD   1 Dose at 06/12/24 0803    guaiFENesin  (MUCINEX ) extended release tablet 600 mg  600 mg Oral BID Ronn Dixon A, PA-C   600 mg at 06/11/24 2046    benzonatate  (TESSALON ) capsule 100 mg  100 mg Oral TID PRN Ronn Dixon A, PA-C   100 mg at 06/10/24 1217    doxycycline  (VIBRAMYCIN ) 100 mg in sodium chloride  0.9 % 100 mL IVPB (addEASE)  100 mg IntraVENous BID Mauricio Mancel LABOR, MD   Stopped at 06/11/24 2148    influenza tiss-cult vaccine (FLUCELVAX) injection 0.5 mL  1 Dose IntraMUSCular Prior to discharge Aimee Elspeth SAUNDERS, MD        bumetanide  (BUMEX ) injection 1 mg  1 mg IntraVENous Daily Azeem, Ahad, MD   1 mg at 06/10/24 0826    metoprolol  tartrate (LOPRESSOR ) tablet 50 mg  50 mg Oral BID Azeem, Ahad, MD   50 mg at 06/11/24 0856    OLANZapine  (ZYPREXA ) tablet 10 mg  10 mg Oral Nightly Punyala, Srinivasa R, MD   10 mg at 06/11/24 2232    atorvastatin  (LIPITOR ) tablet 40 mg  40 mg Oral Nightly Azeem, Ahad, MD   40 mg at 06/11/24 2045    haloperidol  lactate (HALDOL ) injection 5 mg  5 mg IntraVENous Q6H PRN Azeem, Ahad, MD   5 mg at 06/08/24 0438    budesonide  (PULMICORT ) nebulizer suspension 500 mcg  0.5 mg Nebulization BID RT Nunna, Krishidhar R, MD   500 mcg at 06/12/24 9196    insulin  glargine (LANTUS ) injection vial 10 Units  10 Units SubCUTAneous  Daily Nunna, Krishidhar R, MD   10 Units at 06/11/24 0856    ipratropium 0.5 mg-albuterol  2.5 mg (DUONEB ) nebulizer solution 1 Dose  1 Dose Inhalation Q4H PRN Nunna, Krishidhar R, MD   1 Dose at 06/08/24 0304    insulin  lispro (HUMALOG ,ADMELOG ) injection vial 0-8 Units  0-8 Units SubCUTAneous Q4H Nunna, Krishidhar R, MD   2 Units at 06/07/24 2042    0.9 % sodium chloride  infusion   IntraVENous PRN Nunna, Krishidhar R, MD        albuterol  (PROVENTIL ) nebulizer solution 2.5 mg  2.5 mg Nebulization Q4H PRN Nunna, Krishidhar R, MD        [Held by provider] tiotropium bromide  (SPIRIVA  RESPIMAT) 2.5 MCG/ACT inhaler 2 puff  2 puff Inhalation Daily RT Nunna, Krishidhar R, MD        [Held by provider] budesonide -formoterol  (SYMBICORT ) 160-4.5 MCG/ACT inhaler 2 puff  2 puff Inhalation BID RT Nunna, Krishidhar R, MD        hydrALAZINE  (APRESOLINE ) injection 10 mg  10 mg IntraVENous Q4H PRN Sahib, Taaliba A, APRN - NP   10 mg at 06/07/24 0202    glucose chewable tablet 16 g  4 tablet Oral PRN Rawland Anton R, MD   16 g at 06/12/24 0245    dextrose  bolus 10% 125  mL  125 mL IntraVENous PRN Rawland Anton R, MD        Or    dextrose  bolus 10% 250 mL  250 mL IntraVENous PRN Rawland, Krishidhar R, MD        glucagon  injection 1 mg  1 mg SubCUTAneous PRN Nunna, Krishidhar R, MD        dextrose  10 % infusion   IntraVENous Continuous PRN Nunna, Krishidhar R, MD        sertraline  (ZOLOFT ) tablet 100 mg  100 mg Oral Daily Nunna, Krishidhar R, MD   100 mg at 06/11/24 0855    apixaban  (ELIQUIS ) tablet 5 mg  5 mg Per NG tube BID Nunna, Krishidhar R, MD   5 mg at 06/11/24 2046    sodium chloride  flush 0.9 % injection 5-40 mL  5-40 mL IntraVENous 2 times per day Marvis Lynwood HERO, APRN - CNP   10 mL at 06/11/24 2035    sodium chloride  flush 0.9 % injection 5-40 mL  5-40 mL IntraVENous PRN Marvis Lynwood HERO, APRN - CNP        0.9 % sodium chloride  infusion   IntraVENous PRN Marvis Lynwood HERO, APRN - CNP        lidocaine  1 % injection 50 mg  50 mg  IntraDERmal Once Marvis Lynwood HERO, APRN - CNP        ALTEplase  (CATHFLO) injection 2 mg  2 mg IntraCATHeter PRN Marvis Lynwood HERO, APRN - CNP        sodium chloride  flush 0.9 % injection 5-40 mL  5-40 mL IntraVENous PRN Marvis Lynwood HERO, APRN - CNP        0.9 % sodium chloride  infusion   IntraVENous PRN Marvis Lynwood HERO, APRN - CNP   Stopped at 06/05/24 2344    potassium chloride  20 mEq/50 mL IVPB (Central Line)  20 mEq IntraVENous PRN Marvis Lynwood HERO, APRN - CNP        Or    potassium chloride  10 mEq/100 mL IVPB (Peripheral Line)  10 mEq IntraVENous PRN Marvis Lynwood HERO, APRN - CNP        magnesium  sulfate 2000 mg in 50 mL IVPB premix  2,000 mg IntraVENous PRN Marvis Lynwood HERO, APRN - CNP        ondansetron  (ZOFRAN -ODT) disintegrating tablet 4 mg  4 mg Oral Q8H PRN Marvis Lynwood HERO, APRN - CNP        Or    ondansetron  (ZOFRAN ) injection 4 mg  4 mg IntraVENous Q6H PRN Marvis Lynwood HERO, APRN - CNP   4 mg at 06/07/24 1021    polyethylene glycol (GLYCOLAX ) packet 17 g  17 g Oral Daily PRN Marvis Lynwood HERO, APRN - CNP        acetaminophen  (TYLENOL ) tablet 650 mg  650 mg Oral Q6H PRN Marvis Lynwood HERO, APRN - CNP   650 mg at 06/11/24 1442    Or    acetaminophen  (TYLENOL ) suppository 650 mg  650 mg Rectal Q6H PRN Marvis Lynwood HERO, APRN - CNP            ARTERIAL BLOOD GAS  Recent Labs     06/11/24  0411   PHART 7.40   PCO2ART 57*   PO2ART 78*   HCO3ART 34*   BEART 8.0*   FIO2A 32   O2SATART 95   OXYHEM 93.4*   CARBOXHGBART 0.9*   METHGBART 0.4   TEMP 98.4     Labs:    Recent Labs  06/10/24  0300 06/11/24  0901 06/12/24  0433   WBC 7.9 8.4 6.8   HGB 10.1* 11.2* 11.8   PLT 103* 146* 103*     Recent Labs     06/10/24  0300 06/11/24  0901 06/12/24  0433   NA 141 142 140   K 3.8 3.6 3.7   CL 100 99 103   CO2 33* 36* 26   GLUCOSE 89 114* 102*   BUN 22 20 17    CREATININE 0.63 0.68 0.69   CALCIUM  8.2* 8.9 8.5*   BILITOT 0.8  --   --    AST 31  --   --    ALT 16  --   --      No results for input(s): CKTOTAL, CKMB, CKMBINDEX, TROPONINI in the last 72  hours.  Lab Results   Component Value Date/Time    PROBNP 11,914 06/03/2024 05:54 PM      Lab Results   Component Value Date/Time    TSH 2.400 06/09/2024 01:50 PM      Results       Procedure Component Value Units Date/Time    Culture, Blood 1 [[id number]] Collected: 06/09/24 1420    Order Status: Sent Specimen: Blood Updated: 06/09/24 1451    Legionella antigen, urine [[id number]] Collected: 06/04/24 1228    Order Status: Completed Specimen: Urine Updated: 06/06/24 1636     Sample Site Urine        L pneumophila S1 Ag, Ur Negative        Comment: Presumptive negative for L. pneumophila serogroup 1 antigen in urine,  suggesting no recent or current infection. Legionnaires' disease  cannot be ruled out since other serogroups and species may also  cause disease.  Performed At: [facility]  [address] [place], [place] [id number]  Jennette Shorter MD Ey:[phone]         S. pneumonia Antigen, Urine/CSF [[id number]] Collected: 06/04/24 1228    Order Status: Completed Specimen: Urine Updated: 06/06/24 1636     Sample Site Urine        Specimen Urine     S pneumoniae Ag, CSF Negative        FLUID CULTURE Not indicated.     Organism Id Not indicated.     Please note: Comment        Comment: [employer] standards require a culture to be  performed on CSF specimens submitted for bacterial antigen testing.  (CAP [id number]) Urine specimens will not be cultured.  Performed At: [facility]  [address] [place], [place] [id number]  Jennette Shorter MD Ey:[phone]         COVID-19 & Influenza Combo [[id number]] Collected: 06/04/24 0845    Order Status: Completed Specimen: Nasopharynx Updated: 06/04/24 0934     SARS-CoV-2, PCR Not Detected        Comment: Not Detected results do not preclude SARS-CoV-2 infection and should not be used as the sole basis for patient management decisions. Results must be combined with clinical observations, patient history, and epidemiological information         Rapid Influenza A By PCR Not Detected        Rapid Influenza B By PCR Not Detected        Comment:    Testing was performed using cobas Liat SARS-CoV-2 and Influenza A/B nucleic acid assay.  This test is a multiplex Real-Time Reverse Transcriptas         Culture, Blood 2 [[id number]]  (  Abnormal) Collected: 06/04/24 0300    Order Status: Completed Specimen: Blood Updated: 06/06/24 0836     Special Requests No Special Requests        Culture       Staphylococcus species, coagulase negative growing in 1 of 2 bottles drawn No Site Indicated                  This isolate represents a common contaminant and may or may not be clinically significant. Please notify the laboratory if further work is indicated.            (NOTE) CALLED TO ;CAMBPELL IRBY RN@[email or web address] .AM    Culture, Blood, PCR ID Panel [[id number]]  (Abnormal) Collected: 06/04/24 0300    Order Status: Completed Specimen: Blood Updated: 06/05/24 1622     Accession Number Blood        Enterococcus faecalis by PCR Not detected     Enterococcus faecium by PCR Not detected     Listeria monocytogenes by PCR Not detected     STAPHYLOCOCCUS Detected        Staphylococcus Aureus Not detected     Staphylococcus epidermidis by PCR Detected        Staphylococcus lugdunensis by PCR Not detected     STREPTOCOCCUS Not detected     Streptococcus agalactiae (Group B) Not detected     Strep pneumoniae Not detected     Strep pyogenes,(Grp. A) Not detected     Acinetobacter calcoac baumannii complex by PCR Not detected     Bacteroides fragilis by PCR Not detected     Enterobacteriaceae by PCR Not detected     Enterobacter cloacae complex by PCR Not detected     Escherichia Coli Not detected     Klebsiella aerogenes by PCR Not detected     Klebsiella oxytoca by PCR Not detected     Klebsiella pneumoniae group by PCR Not detected     Proteus by PCR Not detected     Salmonella species by PCR Not detected     Serratia marcescens by PCR Not detected     Haemophilus Influenzae by PCR Not  detected     Neisseria meningitidis by PCR Not detected     Pseudomonas aeruginosa Not detected     Stenotrophomonas maltophilia by PCR Not detected     Candida albicans by PCR Not detected     Candida auris by PCR Not detected     Candida glabrata Not detected     Candida krusei by PCR Not detected     Candida parapsilosis by PCR Not detected     Candida tropicalis by PCR Not detected     Cryptococcus neoformans/gattii by PCR Not detected     Resistant gene targets          Methicillin Resistance mecA/C  by PCR Detected        [employer] test comment False positive results may rarely occur. Correlate with     Comment: clinical,epidemiologic,  and other laboratory findings  Please see BCID Interpretation Guide in EPIC Links         MRSA by PCR [[id number]] Collected: 06/04/24 0215    Order Status: Completed Specimen: Swab Updated: 06/04/24 0443     MRSA by PCR Not Detected       Culture, C Auris Screen [[id number]] Collected: 06/04/24 0215    Order Status: Completed Specimen: Swab Updated: 06/07/24 1449     Special Requests No Special Requests  Culture No Candida auris present       Culture, Blood 1 [[id number]] Collected: 06/04/24 0124    Order Status: Completed Specimen: Blood Updated: 06/10/24 1431     Special Requests No Special Requests        Culture No growth 6 days       Culture, Urine [[id number]] Collected: 06/03/24 2204    Order Status: Completed Specimen: Urine Updated: 06/05/24 0811     Special Requests --        No Special Requests  Reflexed from [id number]       Culture No Growth (<1000 cfu/mL)              Imaging:  XR CHEST PORTABLE   Final Result      Persistent but improved diffuse bilateral pulmonary infiltrates.         Electronically signed by DARICE COLON      CTA HEAD NECK W CONTRAST   Final Result         1. No acute large vessel arterial occlusion. Mild to moderate atherosclerotic   changes.   2. Pulmonary hypertension.   3. Biapical airspace disease likely infectious/inflammatory.                Electronically signed by Aletha CHRISTELLA Ham      CT BRAIN PERFUSION   Final Result         1. No acute large vessel arterial occlusion. Mild to moderate atherosclerotic   changes.   2. Pulmonary hypertension.   3. Biapical airspace disease likely infectious/inflammatory.               Electronically signed by Aletha CHRISTELLA Ham      CT HEAD WO CONTRAST   Final Result   No acute intracranial findings.         Electronically signed by PAULINE BLANCH      XR CHEST PORTABLE   Final Result   1. No change bilateral pneumonia         Electronically signed by Evalene JINNY Hoit      CT HEAD WO CONTRAST   Final Result      No acute intracranial abnormality.         Electronically signed by Toribio JINNY Musick      CTA CHEST W WO CONTRAST   Final Result   Extensive bilateral pulmonary infiltrates. No evidence of pulmonary embolism.         Electronically signed by DARICE COLON      XR CHEST PORTABLE   Final Result   1. Enlarged cardiac silhouette, worsening airspace disease or pulmonary edema.   No complicating features post intubation            Electronically signed by Susie Brain      XR CHEST PORTABLE   Final Result   Cardiomegaly and mild interstitial pulmonary edema.             Electronically signed by JIMMY HABIB          No results found.               "

## 2024-06-12 NOTE — Discharge Summary (Signed)
 "                                       Discharge Summary    Name: Breanna Lester  [id number]  Date of Birth: July 13, 1961 (Age: 63 y.o.)   Date of Admission: 06/03/2024  Date of Discharge: 06/12/2024  Attending Physician: Aimee Elspeth SAUNDERS, MD    Discharge Diagnosis:   Principal Problem:    Acute respiratory failure [facility])  Resolved Problems:    * No resolved hospital problems. *  Acute hypoxic respiratory failure, resolved  Hypercapnic respiratory failure  Severe bilateral CAP  COPD  Concern for bacteremia, ruled out  Acute metabolic encephalopathy, resolved  Polysubstance abuse  A-fib  Microcytic anemia  Thrombocytopenia, mild  OSA    Consultations:  IP CONSULT TO PHARMACY  IP CONSULT TO VASCULAR ACCESS TEAM  IP CONSULT TO CASE MANAGEMENT  IP CONSULT TO TELE-NEUROLOGY  IP CONSULT TO VASCULAR ACCESS TEAM  IP CONSULT TO PSYCHIATRY  IP CONSULT TO DIETITIAN  IP CONSULT TO PULMONOLOGY  IP CONSULT TO PULMONOLOGY  IP CONSULT TO CASE MANAGEMENT  IP CONSULT TO CASE MANAGEMENT      Brief Admission History/Reason for Admission Per Rutul DELENA Fairly, MD:   Acute hypoxic respiratory failure    Brief Hospital Course by Main Problems:   Breanna Lester is a 63 y.o.  female with PMHx significant for chronic hypoxic respiratory failure on 3 L nasal cannula secondary to COPD, anxiety, depression, HTN, A-fib on Eliquis , T2DM, pancreatitis and EtOH cirrhosis who presented to ED on 06/03/2024 for worsening dyspnea.  In ED, patient severely hypoxic with increased work of breathing which was refractory to BiPAP, requiring intubation.  Admitted to ICU.  CTA chest showed extensive bilateral pulmonary infiltrates concerning for severe bilateral pneumonia, however negative for PE.  Started on empiric IV ceftriaxone  and azithromycin .  Initial blood culture showing Staph epidermidis, likely contaminant and repeat blood culture with no growth (confirmed with the lab on day of discharge).  Respiratory status improved and patient was able to be extubated to  BiPAP and further decreased to home dose 3 L NC.  However, patient remained confused so psychiatry was consulted.  Started on Zoloft  and Zyprexa .  Stroke alert was called on 10/17 for left-sided weakness with complete left hemianopia.  CT head, CTA head/neck and CT brain perfusion with no acute findings.  Patient unable to tolerate MRI.  Neurology evaluated, consider EEG if clinical status worsening otherwise no further stroke work up.  Transferred to medical service on 10/20.  Pt with increased work of breathing and lethargy on 10/21 with repeat ABG showing worsening hypercapnia of 60.  Started on nightly BiPAP with significant improvement in breathing and normalization of mental status.  Now stable on home dose 3 L nasal cannula.  Pulmonology consulted, recommends Trilogy NIV at discharge.  PT/OT recommending SNF.  Case management assisting with insurance approval of NIV and placement.    Pulmonology has cleared patient for discharge.  Recommend patient get repeat chest CT or CXR in 4 to 6 weeks to ensure resolution of pneumonia. Advised patient to follow-up with PCP in 10 to 14 days with recent admission for continued monitoring and management of medications and other comorbidities.  Discussed discharge plan with patient, which patient agreed to.  All questions answered and patient will be discharged pending delivery of NIV and disposition to SNF versus HH.  Discharge Exam:  Patient seen and examined by me on discharge day.  No new complaints.  Stable on home dose 3 L O2.  Denies chest pain or shortness of breath still with mild cough but feeling much better.  Inquiring about discharge.  Pertinent Findings:  Patient Vitals for the past 24 hrs:   BP Temp Temp src Pulse Resp SpO2   06/12/24 0837 138/66 98.4 F (36.9 C) Oral 65 18 99 %   06/12/24 0801 -- -- -- -- -- 99 %   06/12/24 0524 125/76 98.2 F (36.8 C) Oral 63 20 --   06/12/24 0400 115/61 98.4 F (36.9 C) Oral 63 22 99 %   06/12/24 0211 115/61 98.4 F  (36.9 C) Oral 63 22 --   06/12/24 0000 125/76 -- -- -- -- --   06/11/24 2105 -- -- -- 60 18 99 %   06/11/24 2030 (!) 118/57 98.6 F (37 C) Oral 59 18 92 %   06/11/24 1508 (!) 110/47 98.8 F (37.1 C) Oral 59 18 100 %   06/11/24 1425 -- -- -- -- -- 100 %       Gen:    Not in distress  Chest: Clear lungs, stable on 3 L nasal cannula  CVS:   Regular rhythm.  No edema  Abd:  Soft, not distended, not tender  Neuro: At baseline, alert and oriented x 3    Discharge/Recent Laboratory Results:  Recent Labs     06/12/24  0433   NA 140   K 3.7   CL 103   CO2 26   BUN 17   CREATININE 0.69   GLUCOSE 102*   CALCIUM  8.5*     Recent Labs     06/12/24  0433   HGB 11.8   HCT 41.7   WBC 6.8   PLT 103*       Discharge Medications:     Medication List        START taking these medications      apixaban  5 MG Tabs tablet  Commonly known as: ELIQUIS   Take 1 tablet by mouth 2 times daily     atorvastatin  40 MG tablet  Commonly known as: LIPITOR   Take 1 tablet by mouth nightly     bumetanide  1 MG tablet  Commonly known as: BUMEX   Take 1 tablet by mouth daily     levoFLOXacin  750 MG tablet  Commonly known as: LEVAQUIN   Take 1 tablet by mouth daily for 5 days     metFORMIN  500 MG extended release tablet  Commonly known as: GLUCOPHAGE -XR     OLANZapine  10 MG tablet  Commonly known as: ZYPREXA   Take 1 tablet by mouth nightly            CHANGE how you take these medications      albuterol  sulfate HFA 108 (90 Base) MCG/ACT inhaler  Commonly known as: Ventolin  HFA  Inhale 2 puffs into the lungs 4 times daily as needed for Wheezing  What changed: Another medication with the same name was removed. Continue taking this medication, and follow the directions you see here.     metoprolol  tartrate 50 MG tablet  Commonly known as: LOPRESSOR   Take 1 tablet by mouth 2 times daily  What changed:   medication strength  how much to take            CONTINUE taking these medications      acetaminophen  325 MG tablet  Commonly known as:  TYLENOL   Take 1 tablet by  mouth every 6 hours as needed for Pain     Blood Glucose Monitor System w/Device Kit  Pharmacist to identify preferred meter and strips.     blood glucose test strips  Test 1-2 times a day & as needed for symptoms of irregular blood glucose. Dispense sufficient amount for indicated testing frequency plus additional to accommodate PRN testing needs. Pharmacist to identify preferred brand.     chlorthalidone  25 MG tablet  Commonly known as: HYGROTON      guaiFENesin  600 MG extended release tablet  Commonly known as: MUCINEX   Take 1 tablet by mouth 2 times daily     lactobacillus capsule  Take 1 capsule by mouth daily (with breakfast)     Lancets 30G Misc  Test 1-2 times a day & as needed for symptoms of irregular blood glucose. Dispense sufficient amount for indicated testing frequency plus additional to accommodate PRN testing needs. Pharmacist to identify preferred brand.     Melatonin 10 MG Tabs     pantoprazole  40 MG tablet  Commonly known as: PROTONIX   Take 1 tablet by mouth 2 times daily (before meals)     sertraline  100 MG tablet  Commonly known as: ZOLOFT   Take 1 tablet by mouth daily     Trelegy Ellipta  100-62.5-25 MCG/ACT Aepb inhaler  Generic drug: fluticasone -umeclidin-vilant  Inhale 1 puff into the lungs daily            STOP taking these medications      escitalopram  5 MG tablet  Commonly known as: LEXAPRO             ASK your doctor about these medications      ondansetron  4 MG disintegrating tablet  Commonly known as: ZOFRAN -ODT  Take 1 tablet by mouth every 8 hours as needed for Nausea or Vomiting               Where to Get Your Medications        These medications were sent to [facility] - [place], [place] - [address] - P [phone] [facility] [phone]  [address] [place], [place] [address]      Phone: [phone]   atorvastatin  40 MG tablet  bumetanide  1 MG tablet  levoFLOXacin  750 MG tablet  metoprolol  tartrate 50 MG tablet  OLANZapine  10 MG tablet  Trelegy Ellipta   100-62.5-25 MCG/ACT Aepb inhaler             DISPOSITION:    Home with Family:        Home with HH/PT/OT/RN:    SNF/LTC:    [facility]:    OTHER: SNF vs HH           Code status:   Recommended diet: diabetic diet  Recommended activity: activity as tolerated  Wound care: None      Follow up with:   PCP : Unknown, Provider  Rubin Sprague, MD  [address]  [place] [address]  [phone]    Schedule an appointment as soon as possible for a visit in 1 week(s)  Follow-up with PCP in 10 to 14 days given recent hospitalization for continued monitoring and management of medications and other comorbidities.  Obtain repeat chest x-ray in 4 to 6 weeks to ensure resolution of pneumonia.    Mauricio Mancel LABOR, MD  [address] [place] [address]  [phone]    Schedule an appointment as soon as possible for a  visit in 1 week(s)  Follow-up with pulmonologist in 1 to 2 weeks for continued monitoring and management of COPD requiring O2 and BiPAP    Punyala, Srinivasa R, MD  [address]  [place] [address]  [phone]    Schedule an appointment as soon as possible for a visit in 1 week(s)  Follow-up with psychiatry in 1 to 2 weeks for continued medication management          Total time in minutes spent coordinating this discharge (includes going over instructions, follow-up, prescriptions, and preparing report for sign off to her PCP) :  35 minutes   "

## 2024-06-12 NOTE — RT Protocol Note (Signed)
"  RT Inhaler-Nebulizer Bronchodilator Protocol Note    There is a bronchodilator order in the chart from a provider indicating to follow the RT Bronchodilator Protocol and there is an Initiate RT Inhaler-Nebulizer Bronchodilator Protocol order as well (see protocol at bottom of note).    CXR Findings:  XR CHEST PORTABLE  Result Date: 06/10/2024  Persistent but improved diffuse bilateral pulmonary infiltrates. Electronically signed by DARICE COLON      The findings from the last RT Protocol Assessment were as follows:   History Pulmonary Disease: Chronic pulmonary disease  Respiratory Pattern: Regular pattern and RR 12-20 bpm  Breath Sounds: Slightly diminished and/or crackles  Cough: Strong, spontaneous, non-productive  Indication for Bronchodilator Therapy: On home bronchodilators  Bronchodilator Assessment Score: 4    Aerosolized bronchodilator medication orders have been revised according to the RT Inhaler-Nebulizer Bronchodilator Protocol below.    Respiratory Therapist to perform RT Therapy Protocol Assessment initially then follow the protocol.  Repeat RT Therapy Protocol Assessment PRN for score 0-3 or on second treatment, BID, and PRN for scores above 3.    No Indications - adjust the frequency to every 6 hours PRN wheezing or bronchospasm, if no treatments needed after 48 hours then discontinue using Per Protocol order mode.     If indication present, adjust the RT bronchodilator orders based on the Bronchodilator Assessment Score as indicated below.  Use Inhaler orders unless patient has one or more of the following: on home nebulizer, not able to hold breath for 10 seconds, is not alert and oriented, cannot activate and use MDI correctly, or respiratory rate 25 breaths per minute or more, then use the equivalent nebulizer order(s) with same Frequency and PRN reasons based on the score.  If a patient is on this medication at home then do not decrease Frequency below that used at home.    0-3 - enter or  revise RT bronchodilator order(s) to equivalent RT Bronchodilator order with Frequency of every 4 hours PRN for wheezing or increased work of breathing using Per Protocol order mode.        4-6 - enter or revise RT Bronchodilator order(s) to two equivalent RT bronchodilator orders with one order with BID Frequency and one order with Frequency of every 4 hours PRN wheezing or increased work of breathing using Per Protocol order mode.        7-10 - enter or revise RT Bronchodilator order(s) to two equivalent RT bronchodilator orders with one order with TID Frequency and one order with Frequency of every 4 hours PRN wheezing or increased work of breathing using Per Protocol order mode.       11-13 - enter or revise RT Bronchodilator order(s) to one equivalent RT bronchodilator order with QID Frequency and an Albuterol  order with Frequency of every 4 hours PRN wheezing or increased work of breathing using Per Protocol order mode.      Greater than 13 - enter or revise RT Bronchodilator order(s) to one equivalent RT bronchodilator order with every 4 hours Frequency and an Albuterol  order with Frequency of every 2 hours PRN wheezing or increased work of breathing using Per Protocol order mode.     RT to enter RT Home Evaluation for COPD & MDI Assessment order using Per Protocol order mode.    Electronically signed by Ronal CHRISTELLA Shape, RT on 06/12/2024 at 6:31 AM  "

## 2024-06-12 NOTE — Progress Notes (Signed)
 "Pulmonary and Critical Care progress note    Subjective:   Consult Note: 06/12/2024 @no  control      Chief Complaint:   Chief Complaint   Patient presents with    Chest Pain        This patient has been seen and evaluated at the request of Dr. Phill    63 year old obese Caucasian lady  I am asked to see for acute respiratory failure with hypoxia and hypercapnia    Patient has a past medical history significant for chronic hypoxic respiratory failure on 3 L nasal cannula secondary to COPD, obstructive sleep apnea on CPAP, anxiety, depression, HTN, A-fib on Eliquis , T2DM, pancreatitis and EtOH cirrhosis     Presented to ED on 06/03/2024 for worsening dyspnea.  In ED, patient severely hypoxic with increased work of breathing which was refractory to BiPAP, requiring intubation.  CTA chest shows extensive bilateral pulmonary infiltrates concerning for severe bilateral pneumonia, however negative for PE.  Started on empiric IV ceftriaxone  and azithromycin .  Respiratory status improved and patient was able to be extubated to BiPAP and further decreased to home dose 3 L NC.  However, patient remained confused so psychiatry was consulted.  Started on Zoloft  and Zyprexa .  Stroke alert was called on 10/17 for left-sided weakness with complete left hemianopia.  CT head, CTA head/neck and CT brain perfusion with no acute findings.  Patient unable to tolerate MRI.  Neurology evaluated, consider EEG if clinical status worsening otherwise no further stroke work up.      Transferred to medical service on 10/20.  I was asked to see the patient to take over her pulmonary management by intensivist Dr. Azeem.    ABGs 06/09/2024 showed:  pH of 7.40, pCO2 60, pO2 81, bicarb 37, saturation 96% on BiPAP with 32% FiO2    Pt with increased work of breathing and lethargy on 10/21 with repeat ABG showing worsening hypercapnia of 60.  Started on nightly BiPAP with significant improvement in breathing and normalization of mental status.      Echocardiogram 05/05/2024 reviewed personally showing:  LVEF 70 to 75%  Dilated RA and RV    Last chest x-ray 06/05/24 reviewed personally showing:  Patient was still intubated at that time  Bilateral infiltrates    Currently on nasal cannula oxygen    06/11/2024:    Patient seen and examined  Overnight events noted    Sitting up in the bed comfortably  Having breakfast  On 3 L nasal cannula oxygen  Improved respiratory status  Much more awake and alert and interactive and answering questions appropriately  Has used BiPAP 16/8, 30% FiO2 overnight  ABGs reviewed showing pH of 7.40, pCO2 57, pO2 78, bicarb 34, saturation 95%    06/12/2024:    Patient seen and examined  Overnight events noted  Sitting up in bed comfortably  Awake and alert  Having breakfast  Seated nasal cannula oxygen  Used BiPAP 14/8, 40% FiO2 overnight  No acute distress  ABGs and imaging reviewed    Review of Systems:  Pertinent items are noted in HPI.    Past Medical History:   Diagnosis Date    Anxiety     Depression     Gastrointestinal disorder     Heart failure (HCC)     Hyperlipidemia     Hypertension     Liver disease 2009    liver failure    Pneumonia      Past Surgical History:   Procedure Laterality  Date    ADENOIDECTOMY  72    BACK SURGERY      cement discs following a fall    CATARACT REMOVAL      CESAREAN SECTION  1986 and 1993    DILATION AND CURETTAGE OF UTERUS  1999    EGD TRANSORAL BIOPSY SINGLE/MULTIPLE  05/17/2009         HYSTERECTOMY (CERVIX STATUS UNKNOWN)  2008    TONSILLECTOMY  1972    UPPER GASTROINTESTINAL ENDOSCOPY N/A 04/17/2024    ESOPHAGOGASTRODUODENOSCOPY performed by Madlyn Fendt, MD at [facility] ENDOSCOPY      Family History   Problem Relation Age of Onset    Cancer Mother     Heart Disease Father      Social History     Tobacco Use    Smoking status: Former     Current packs/day: 0.00     Types: Cigarettes     Quit date: 02/28/2008     Years since quitting: 16.2    Smokeless tobacco: Never   Substance Use Topics     Alcohol use: Never      Current Facility-Administered Medications   Medication Dose Route Frequency Provider Last Rate Last Admin    ipratropium 0.5 mg-albuterol  2.5 mg (DUONEB ) nebulizer solution 1 Dose  1 Dose Inhalation BID RT Aimee Elspeth SAUNDERS, MD   1 Dose at 06/12/24 0803    guaiFENesin  (MUCINEX ) extended release tablet 600 mg  600 mg Oral BID Gillard, Madison A, PA-C   600 mg at 06/12/24 9081    benzonatate  (TESSALON ) capsule 100 mg  100 mg Oral TID PRN Ronn Dixon A, PA-C   100 mg at 06/10/24 1217    doxycycline  (VIBRAMYCIN ) 100 mg in sodium chloride  0.9 % 100 mL IVPB (addEASE)  100 mg IntraVENous BID Ladd Cen A, MD 100 mL/hr at 06/12/24 0929 100 mg at 06/12/24 9070    influenza tiss-cult vaccine (FLUCELVAX) injection 0.5 mL  1 Dose IntraMUSCular Prior to discharge Aimee Elspeth SAUNDERS, MD        bumetanide  (BUMEX ) injection 1 mg  1 mg IntraVENous Daily Azeem, Ahad, MD   1 mg at 06/10/24 9173    metoprolol  tartrate (LOPRESSOR ) tablet 50 mg  50 mg Oral BID Azeem, Ahad, MD   50 mg at 06/12/24 0918    OLANZapine  (ZYPREXA ) tablet 10 mg  10 mg Oral Nightly Punyala, Srinivasa R, MD   10 mg at 06/11/24 2232    atorvastatin  (LIPITOR ) tablet 40 mg  40 mg Oral Nightly Azeem, Ahad, MD   40 mg at 06/11/24 2045    haloperidol  lactate (HALDOL ) injection 5 mg  5 mg IntraVENous Q6H PRN Azeem, Ahad, MD   5 mg at 06/08/24 0438    budesonide  (PULMICORT ) nebulizer suspension 500 mcg  0.5 mg Nebulization BID RT Nunna, Krishidhar R, MD   500 mcg at 06/12/24 0803    insulin  glargine (LANTUS ) injection vial 10 Units  10 Units SubCUTAneous Daily Nunna, Krishidhar R, MD   10 Units at 06/12/24 0916    ipratropium 0.5 mg-albuterol  2.5 mg (DUONEB ) nebulizer solution 1 Dose  1 Dose Inhalation Q4H PRN Nunna, Krishidhar R, MD   1 Dose at 06/08/24 0304    insulin  lispro (HUMALOG ,ADMELOG ) injection vial 0-8 Units  0-8 Units SubCUTAneous Q4H Rawland Anton R, MD   2 Units at 06/07/24 2042    0.9 % sodium chloride  infusion   IntraVENous  PRN Nunna, Krishidhar R, MD        albuterol  (  PROVENTIL ) nebulizer solution 2.5 mg  2.5 mg Nebulization Q4H PRN Nunna, Krishidhar R, MD        [Held by provider] tiotropium bromide  (SPIRIVA  RESPIMAT) 2.5 MCG/ACT inhaler 2 puff  2 puff Inhalation Daily RT Nunna, Krishidhar R, MD        [Held by provider] budesonide -formoterol  (SYMBICORT ) 160-4.5 MCG/ACT inhaler 2 puff  2 puff Inhalation BID RT Nunna, Krishidhar R, MD        hydrALAZINE  (APRESOLINE ) injection 10 mg  10 mg IntraVENous Q4H PRN Sahib, Taaliba A, APRN - NP   10 mg at 06/07/24 0202    glucose chewable tablet 16 g  4 tablet Oral PRN Nunna, Krishidhar R, MD   16 g at 06/12/24 0245    dextrose  bolus 10% 125 mL  125 mL IntraVENous PRN Nunna, Krishidhar R, MD        Or    dextrose  bolus 10% 250 mL  250 mL IntraVENous PRN Nunna, Krishidhar R, MD        glucagon  injection 1 mg  1 mg SubCUTAneous PRN Nunna, Krishidhar R, MD        dextrose  10 % infusion   IntraVENous Continuous PRN Nunna, Krishidhar R, MD        sertraline  (ZOLOFT ) tablet 100 mg  100 mg Oral Daily Nunna, Krishidhar R, MD   100 mg at 06/12/24 9081    apixaban  (ELIQUIS ) tablet 5 mg  5 mg Per NG tube BID Nunna, Krishidhar R, MD   5 mg at 06/12/24 9081    sodium chloride  flush 0.9 % injection 5-40 mL  5-40 mL IntraVENous 2 times per day Marvis Lynwood HERO, APRN - CNP   10 mL at 06/11/24 2035    sodium chloride  flush 0.9 % injection 5-40 mL  5-40 mL IntraVENous PRN Marvis Lynwood HERO, APRN - CNP        0.9 % sodium chloride  infusion   IntraVENous PRN Marvis Lynwood HERO, APRN - CNP        lidocaine  1 % injection 50 mg  50 mg IntraDERmal Once Marvis Lynwood HERO, APRN - CNP        ALTEplase  (CATHFLO) injection 2 mg  2 mg IntraCATHeter PRN Marvis Lynwood HERO, APRN - CNP        sodium chloride  flush 0.9 % injection 5-40 mL  5-40 mL IntraVENous PRN Marvis Lynwood HERO, APRN - CNP        0.9 % sodium chloride  infusion   IntraVENous PRN Marvis Lynwood HERO, APRN - CNP   Stopped at 06/05/24 2344    potassium chloride  20 mEq/50 mL IVPB (Central  Line)  20 mEq IntraVENous PRN Marvis Lynwood HERO, APRN - CNP        Or    potassium chloride  10 mEq/100 mL IVPB (Peripheral Line)  10 mEq IntraVENous PRN Marvis Lynwood HERO, APRN - CNP        magnesium  sulfate 2000 mg in 50 mL IVPB premix  2,000 mg IntraVENous PRN Marvis Lynwood HERO, APRN - CNP        ondansetron  (ZOFRAN -ODT) disintegrating tablet 4 mg  4 mg Oral Q8H PRN Marvis Lynwood HERO, APRN - CNP        Or    ondansetron  (ZOFRAN ) injection 4 mg  4 mg IntraVENous Q6H PRN Marvis Lynwood HERO, APRN - CNP   4 mg at 06/07/24 1021    polyethylene glycol (GLYCOLAX ) packet 17 g  17 g Oral Daily PRN Marvis Lynwood HERO, APRN - CNP  acetaminophen  (TYLENOL ) tablet 650 mg  650 mg Oral Q6H PRN Marvis Lynwood HERO, APRN - CNP   650 mg at 06/11/24 1442    Or    acetaminophen  (TYLENOL ) suppository 650 mg  650 mg Rectal Q6H PRN Marvis Lynwood HERO, APRN - CNP              Allergies   Allergen Reactions    Latex Rash           Objective:     Blood pressure 138/66, pulse 65, temperature 98.4 F (36.9 C), temperature source Oral, resp. rate 18, height 1.575 m (5' 2), weight 112.2 kg (247 lb 5.7 oz), SpO2 99%. Temp (24hrs), Avg:98.5 F (36.9 C), Min:98.2 F (36.8 C), Max:98.8 F (37.1 C)      Intake and Output:  Current Shift: No intake/output data recorded.  Last 3 Shifts: 10/21 1901 - 10/23 0700  In: 10 [I.V.:10]  Out: 400 [Urine:400]  @INTAKEOUTPUTBRIEF @     Physical Exam:     General: Lying in bed comfortably, mild respiratory distress.  On nasal cannula oxygen.  Obese  Eye: Reactive, symmetric  Throat and Neck: Supple  Lung: Reduced air entry bilaterally with prolonged exhalation.  Occasional wheezing.  Bilateral crackles   Heart: S1+S2.  No murmurs  Abdomen: soft, non-tender. Bowel sounds normal. No masses; obese  Extremities: No edema  GU: Not done  Skin: No cyanosis  Neurologic: A & O x3.  Grossly nonfocal  Psychiatric: Mildly anxious      Lab/Data Review:      Recent Results (from the past 24 hours)   POCT Glucose    Collection Time: 06/11/24  4:38 PM    Result Value Ref Range    POC Glucose 148 (H) 65 - 100 mg/dL    Performed by: Mannie Coward    POCT Glucose    Collection Time: 06/11/24  8:53 PM   Result Value Ref Range    POC Glucose 178 (H) 65 - 100 mg/dL    Performed by: Olalade Karla Joie    POCT Glucose    Collection Time: 06/12/24  1:43 AM   Result Value Ref Range    POC Glucose 69 65 - 100 mg/dL    Performed by: Jonette Earl    Procalcitonin    Collection Time: 06/12/24  4:33 AM   Result Value Ref Range    Procalcitonin 0.09 ng/mL   CBC    Collection Time: 06/12/24  4:33 AM   Result Value Ref Range    WBC 6.8 3.6 - 11.0 K/uL    RBC 4.71 3.80 - 5.20 M/uL    Hemoglobin 11.8 11.5 - 16.0 g/dL    Hematocrit 58.2 64.9 - 47.0 %    MCV 88.5 80.0 - 99.0 FL    MCH 25.1 (L) 26.0 - 34.0 PG    MCHC 28.3 (L) 30.0 - 36.5 g/dL    RDW 82.9 (H) 88.4 - 14.5 %    Platelets 103 (L) 150 - 400 K/uL    MPV 12.1 8.9 - 12.9 FL    Nucleated RBCs 0.0 0.0 PER 100 WBC    nRBC 0.00 0.00 - 0.01 K/uL   Basic Metabolic Panel    Collection Time: 06/12/24  4:33 AM   Result Value Ref Range    Sodium 140 136 - 145 mmol/L    Potassium 3.7 3.5 - 5.1 mmol/L    Chloride 103 98 - 107 mmol/L    CO2 26 20 - 29 mmol/L  Anion Gap 12 2 - 14 mmol/L    Glucose 102 (H) 65 - 100 mg/dL    BUN 17 8 - 23 mg/dL    Creatinine 9.30 9.39 - 1.00 mg/dL    BUN/Creatinine Ratio 24 (H) 12 - 20      Est, Glom Filt Rate >90 >59 ml/min/1.64m2    Calcium  8.5 (L) 8.8 - 10.2 mg/dL   POCT Glucose    Collection Time: 06/12/24  5:13 AM   Result Value Ref Range    POC Glucose 88 65 - 100 mg/dL    Performed by: Jonette Earl    POCT Glucose    Collection Time: 06/12/24  8:35 AM   Result Value Ref Range    POC Glucose 121 (H) 65 - 100 mg/dL    Performed by: Belvie Lesches        XR CHEST PORTABLE   Final Result      Persistent but improved diffuse bilateral pulmonary infiltrates.         Electronically signed by DARICE COLON      CTA HEAD NECK W CONTRAST   Final Result         1. No acute large vessel arterial occlusion.  Mild to moderate atherosclerotic   changes.   2. Pulmonary hypertension.   3. Biapical airspace disease likely infectious/inflammatory.               Electronically signed by Aletha CHRISTELLA Ham      CT BRAIN PERFUSION   Final Result         1. No acute large vessel arterial occlusion. Mild to moderate atherosclerotic   changes.   2. Pulmonary hypertension.   3. Biapical airspace disease likely infectious/inflammatory.               Electronically signed by Aletha CHRISTELLA Ham      CT HEAD WO CONTRAST   Final Result   No acute intracranial findings.         Electronically signed by PAULINE BLANCH      XR CHEST PORTABLE   Final Result   1. No change bilateral pneumonia         Electronically signed by Evalene JINNY Hoit      CT HEAD WO CONTRAST   Final Result      No acute intracranial abnormality.         Electronically signed by Toribio JINNY Musick      CTA CHEST W WO CONTRAST   Final Result   Extensive bilateral pulmonary infiltrates. No evidence of pulmonary embolism.         Electronically signed by DARICE COLON      XR CHEST PORTABLE   Final Result   1. Enlarged cardiac silhouette, worsening airspace disease or pulmonary edema.   No complicating features post intubation            Electronically signed by Susie Brain      XR CHEST PORTABLE   Final Result   Cardiomegaly and mild interstitial pulmonary edema.             Electronically signed by JIMMY HABIB            Assessment:     1.  Acute on chronic respiratory failure with hypercapnia  2.  Acute on chronic respiratory failure with hypoxia  3.  Bilateral pneumonia  4.  Acute metabolic encephalopathy  5.  Acute exacerbation of COPD  6.  Obstructive sleep apnea  7.  Liver cirrhosis  8.  Chronic atrial fibrillation  9.  Obesity  10.  Polysubstance abuse  11.  Anemia and thrombocytopenia    Plan:     Patient seen on the medical floor  Being watched here closely    On BiPAP 16/8, 30% FiO2 overnight  Now on 3 L nasal cannula oxygen  Mental status much improved  ABGs showing  persistent hypercapnic respiratory failure despite BiPAP which represents failure of BiPAP. I believe the patient's respiratory failure with hypercapnia is due to underlying COPD.  I believe she will benefit from noninvasive positive pressure ventilator at discharge for home use for target tidal volume, backup battery, and close monitoring to help reduce the risk of exacerbations, hospitalizations, and mortality.  Case management helping in obtaining noninvasive positive pressure ventilation for home use    Bilateral pneumonia  Continue broad-spectrum antibiotics  Cultures sent  Expanded pneumonia workup sent  Further changes in antibiotics based on clinical response and culture results  Last chest x-ray showing extensive bilateral infiltrates  Will repeat chest x-ray this a.m.  Start doxycycline   Underlying COPD  Continue Solu-Medrol   Continue nebulizers  Will need PFTs in outpatient after discharge    Altered mental status likely combination of hypoxia, hypercapnia, polysubstance abuse  UDS positive for amphetamines  Ammonia normal  Neurology evaluated recommended EEG depending on clinical status  Psychiatry following and have recommended Xolox and Zyprexa   Monitor neurological status with treatment    Liver cirrhosis  Will need to see GI in outpatient  Stable for now    DVT and GI prophylaxis    Clinically improved  Out of bed to chair with PT OT  Will follow-up with me in outpatient clinic after discharge for PFTs and sleep evaluation    Questions of patient were answered at bedside in detail  Case discussed in detail with RN, RT, and care team including hospitalist  Thank you for involving me in the care of this patient  I will follow with you closely during hospitalization    Time spent more than 75 minutes in direct patient care with no overlap reviewing results and records, decision making, and answering questions.      MANCEL DELENA HAIR, MD  [facility] (PAT)  06/12/2024  10:02  AM             "

## 2024-06-12 NOTE — Care Coordination (Signed)
"  Signed NIV order has been sent to [employer], awaiting approval.    CM spoke with patient's daughter, Harlene [phone], regarding DCP, she expressed concern for patients safety if dc to home at this time, states she has been on FMLA for her mom but would like to return to work.    CM attempted to meet with patient to discuss however patient meeting with therapy at this time, will follow up at a later time.      "

## 2024-06-12 NOTE — Plan of Care (Addendum)
 "         PHYSICAL THERAPY TREATMENT     Patient: Breanna Lester (63 y.o. female)  Date: 06/12/2024  Diagnosis: Acute respiratory failure, unspecified whether with hypoxia or hypercapnia (HCC) [J96.00]  Acute on chronic congestive heart failure, unspecified heart failure type (HCC) [I50.9]  Acute hypoxic respiratory failure (HCC) [J96.01] Acute respiratory failure (HCC)      Precautions: General Precautions, Fall Risk, Bed Alarm                      Recommendations for nursing mobility: Out of bed to chair for meals, Encourage HEP in prep for ADLs/mobility; see handout for details, Frequent repositioning to prevent skin breakdown, Use of bed/chair alarm for safety, and AD and gt belt for bed to chair     In place during session: External Catheter  Chart, physical therapy assessment, plan of care and goals were reviewed.  ASSESSMENT  Patient continues with skilled PT services and is progressing towards goals. Pt in bed upon PT arrival, agreeable to session. Pt A&O x 4. (See below for objective details and assist levels).     Overall pt tolerated session well today with improved ambulation. Initially upon first STS transfer, pt was unsteady and req increased assist so returned to seated to reposition and much steadier on second attempt. Pt cont to req min-mod A x1 for transfers but was able to ambulate approx 10 ft then another 15 ft with RW, min A with recliner chair follow behind for increased safety. Nursing student present to assist with chair follow. Educated on rest breaks and cont LE therex to improve strength. Pt demos unsteady OOB mobility and weakness and would benefit from receiving therapy prior to returning home as pt is currently high fall risk. Will continue to benefit from skilled PT services, and will continue to progress as tolerated. Current PT DC recommendation Moderate intensity short-term skilled physical therapy up to 5x/week once medically appropriate.    GOALS:    Problem: Physical Therapy -  Adult  Goal: By Discharge: Performs mobility at highest level of function for planned discharge setting.  See evaluation for individualized goals.  Description: FUNCTIONAL STATUS PRIOR TO ADMISSION: Pt required assistance with ADLs and IADLs    HOME SUPPORT: The patient lived with daughter to provide assistance but is not available to 24/7 for assistance    Physical Therapy Goals  Initiated 06/09/2024  Pt stated goal: to go home  Pt will be I with LE HEP in 7 days.  Pt will perform bed mobility with CGA in 7 days.  Pt will perform transfers with CGA in 7 days.   Pt will amb 100 feet with LRAD safely with Contact Guard Assist in 7 days.  Pt will demonstrate improvement in standing balance from Moderate Assist x2 to CGA in 7 days.      Outcome: Progressing          PLAN :  Patient continues to benefit from skilled intervention to address functional impairments. Continue treatment per established plan of care to address goals.    Recommendation for discharge: (in order for the patient to meet his/her long term goals)  Moderate intensity short-term skilled physical therapy up to 5x/week    Potential barriers for safe discharge: pt has poor safety awareness, pt is a high fall risk, and pt is not safe to be alone.    IF patient discharges home will need the following IFZ:mnoopwh walker and continuing to  assess with progress     SUBJECTIVE:   Patient stated I want to try to make it to the door.    OBJECTIVE DATA SUMMARY:   Critical Behavior:  Orientation  Overall Orientation Status: Within Functional Limits  Orientation Level: Oriented X4  Cognition  Overall Cognitive Status: Exceptions  Arousal/Alertness: Appears intact  Following Commands: Appears intact  Attention Span: Appears intact  Memory: Appears intact  Safety Judgement: Decreased awareness of need for safety  Problem Solving: Assistance required to implement solutions;Assistance required to identify errors made;Assistance required to generate  solutions;Assistance required to correct errors made  Insights: Fully aware of deficits  Initiation: Requires cues for some  Sequencing: Requires cues for some    Functional Mobility Training:  Bed Mobility:  Bed Mobility Training  Bed Mobility Training: Yes  Overall Level of Assistance: Partial/Moderate assistance  Interventions: Verbal cues;Safety awareness training;Visual cues;Tactile cues;Manual cues  Rolling: Minimal assistance  Supine to Sit: Minimal assistance  Sit to Supine: Stand by assistance  Scooting: Stand by assistance  Transfers:  Transfer Training  Transfer Training: Yes  Interventions: Manual cues;Safety awareness training;Verbal cues;Tactile cues  Sit to Stand: Partial/Moderate assistance  Stand to Sit: Minimal assistance  Balance:  Balance  Sitting: Intact  Standing: Impaired  Standing - Static: Constant support;Fair  Standing - Dynamic: Constant support;Fair    Ambulation/Gait Training:    Soil Scientist: Yes  Overall Level of Assistance: Minimal assistance  Distance (ft): 25 Feet (10 ft plus 71ft)  Assistive Device: Gait belt;Walker, rolling  Interventions: Manual cues;Safety awareness training;Verbal cues  Base of Support: Widened  Speed/Cadence: Slow  Step Length: Right shortened;Left shortened        Therapeutic Intervention provided:   bed mobility , OOB transfers, and amb with AD       Pain Rating:  0/10  reported      Activity Tolerance:   Fair  and requires rest breaks    After treatment patient left in no apparent distress:   Bed locked and returned to lowest position, Patient left in no apparent distress in bed, Call bell within reach, and Side rails x3, and nsg updated     COMMUNICATION/COLLABORATION:   The patients plan of care was discussed with: Registered nurse    Patient Education  Education Given To: Patient  Education Provided: Role of Therapy;Plan of Care;Transfer Training;Energy Conservation;Equipment;Mobility Training;Home Exercise Program  Education Provided Comments:  therex, HEP, safety with transfers, ambulation  Education Method: Demonstration;Verbal  Barriers to Learning: None  Education Outcome: Verbalized understanding;Demonstrated understanding;Continued education needed      Harlene Latisha Kaufmann, PTA  Minutes: 24           "

## 2024-06-12 NOTE — Plan of Care (Signed)
"    Problem: Chronic Conditions and Co-morbidities  Goal: Patient's chronic conditions and co-morbidity symptoms are monitored and maintained or improved  06/12/2024 0048 by Exie Round, RN  Outcome: Progressing  Flowsheets (Taken 06/12/2024 0048)  Care Plan - Patient's Chronic Conditions and Co-Morbidity Symptoms are Monitored and Maintained or Improved:   Monitor and assess patient's chronic conditions and comorbid symptoms for stability, deterioration, or improvement   Collaborate with multidisciplinary team to address chronic and comorbid conditions and prevent exacerbation or deterioration     Problem: Discharge Planning  Goal: Discharge to home or other facility with appropriate resources  06/12/2024 0048 by Melanny Wire, RN  Outcome: Progressing  Flowsheets (Taken 06/12/2024 0048)  Discharge to home or other facility with appropriate resources:   Identify barriers to discharge with patient and caregiver   Arrange for needed discharge resources and transportation as appropriate     Problem: Musculoskeletal - Adult  Goal: Return mobility to safest level of function  06/12/2024 0048 by Exie Round, RN  Outcome: Progressing  Flowsheets (Taken 06/12/2024 0048)  Return Mobility to Safest Level of Function:   Assess patient stability and activity tolerance for standing, transferring and ambulating with or without assistive devices   Assist with transfers and ambulation using safe patient handling equipment as needed     "

## 2024-06-12 NOTE — Plan of Care (Signed)
 "OCCUPATIONAL THERAPY TREATMENT  Patient: Breanna Lester (63 y.o. female)  Date: 06/12/2024  Primary Diagnosis: Acute respiratory failure, unspecified whether with hypoxia or hypercapnia (HCC) [J96.00]  Acute on chronic congestive heart failure, unspecified heart failure type (HCC) [I50.9]  Acute hypoxic respiratory failure (HCC) [J96.01]  Precautions: General Precautions, Fall Risk, Bed Alarm  Recommendations for nursing mobility: Out of bed to chair for meals, Encourage HEP in prep for ADLs/mobility; see handout for details, Frequent repositioning to prevent skin breakdown, Use of bed/chair alarm for safety, LE elevation for management of edema, Use of BSC for toileting , AD and gt belt for bed to chair , Assist x1, Assist x2, and Camie Ip for safety w/ Pt fatigue as needed    In place during session: Peripheral IV and External Catheter  Chart, occupational therapy assessment, plan of care, and goals were reviewed.  ASSESSMENT  Patient continues with skilled OT services and is slowly progressing towards goals. RN consulted at session initiation on Pt status and cleared for therapeutic participation. Pt presented semi supine upon COTA arrival, agreeable to session minimal session participation due to lunch delivery and previous mobility this AM. Pt A&O x 4.     Pt completed bed mobility w/ overall Mod A, coming to sit EOB and presenting w/ good static/dynamic sitting balance. Pt tolerated sitting EOB ~7 min for ADL completion, endorsing no fatigue. Pt required setup of food items on tray table within reach to self feed/drink while sitting EOB. Pt given skilled edu on energy conservation techniques, safety during functional mobility, and the benefits of therapeutic participation w/ Pt endorsing good understanding/demonstrating good follow through at this time. Patient left positioned comfortably sitting EOB w/ alarm active and all known needs met or within reach. RN updated at session end on Pt therapeutic status  and participation.       Pt required Total A to adjust socks while semi supine.     (See below for objective details and assist levels).     Overall, Pt presented w/ good/fair session tolerance this day. Pt, however, continues w/ deficits in strength, endurance and safety awareness impacting functional capacity to safely and Ind complete ADLS/IADLS/Functional Mobility at this time.  Current OT recommendations for discharge Moderate intensity short-term skilled occupational therapy up to 5x/week. Will continue to benefit from skilled OT services, and will continue to progress as tolerated.     GOALS:  Problem: Occupational Therapy - Adult  Goal: By Discharge: Performs self-care activities at highest level of function for planned discharge setting.  See evaluation for individualized goals.  Description: FUNCTIONAL STATUS PRIOR TO ADMISSION:  Pt required assistance with ADLs and IADLs    HOME SUPPORT: The patient lived with daughter to provide assistance.    Occupational Therapy Goals:  Initiated 06/09/2024  Patient/Family stated goal: Unstated  1.  Patient will perform grooming with Independence within 7 day(s).  2.  Patient will perform upper body dressing with Independence within 7 day(s).  3.  Patient will perform lower body dressing with Minimal Assist within 7 day(s).  4.  Patient will perform toilet transfers with Moderate Assist  within 7 day(s).  5.  Patient will perform all aspects of toileting with Moderate Assist within 7 day(s).  6.  Patient will participate in upper extremity therapeutic exercise/activities with Independence within 7 day(s).  Outcome: Progressing        PLAN :  Patient continues to benefit from skilled intervention to address functional impairments. Continue treatment per established plan  of care to address goals.    Recommend next OT session: Toileting, UB dressing, LB dressing, UB bathing, LB bathing, and Seated grooming    Recommendation for discharge: (in order for the patient to meet  his/her long term goals): Moderate intensity short-term skilled occupational therapy up to 5x/week    Potential barriers for safe discharge: pts current support system is unable to meet their requirements for physical assistance, pt has poor safety awareness, pt has impaired cognition, pt is a high fall risk, pt is not safe to be alone, and concern for pt safely navigating or managing the home environment.     IF patient discharges home will need the following DME: continuing to assess with progress     SUBJECTIVE:   Patient stated I got up just before and walked to the chair and back so the bed could be changed with someone.    OBJECTIVE DATA SUMMARY:   Cognitive/Behavioral Status:  Orientation  Overall Orientation Status: Within Functional Limits  Orientation Level: Oriented X4  Cognition  Overall Cognitive Status: Exceptions  Arousal/Alertness: Appears intact  Following Commands: Appears intact  Attention Span: Appears intact  Memory: Appears intact  Safety Judgement: Decreased awareness of need for safety  Problem Solving: Assistance required to implement solutions;Assistance required to identify errors made;Assistance required to generate solutions;Assistance required to correct errors made  Insights: Fully aware of deficits  Initiation: Requires cues for some  Sequencing: Requires cues for some    Functional Mobility and Transfers for ADLs:  Bed Mobility:  Bed Mobility Training  Bed Mobility Training: Yes  Overall Level of Assistance: Partial/Moderate assistance  Interventions: Verbal cues;Safety awareness training;Visual cues;Tactile cues;Manual cues  Rolling: Minimal assistance  Supine to Sit: Partial/Moderate assistance  Scooting: Contact guard assistance    Transfers:  Art Therapist: No    Balance:  Balance  Sitting: Intact    ADL Intervention:    Feeding: Setup  Feeding Skilled Clinical Factors: Pt required setup of food items on tray table within reach to self feed/drink while  sitting EOB.    Putting On/Taking Off Footwear: Dependent/Total  Putting On/Taking Off Footwear Skilled Clinical Factors: Pt required Total A to adjust socks while semi supine.    Therapeutic Intervention provided:   energy conservation, LE dressing, functional mobility/transfers in preparation for completion of ADLs/IADLs, and feeding    Pain Rating:  0/10     Activity Tolerance:   Good and Fair     After treatment patient left in no apparent distress:   Bed locked and returned to lowest position, Call bell within reach, Updated patient's board on functional status and mobility recommendations, and Pt left in no apparent distress sitting on EOB eating lunch w/ alarm active and RN informed, and nsg updated     COMMUNICATION/EDUCATION:   The patients plan of care was discussed with: Registered nurse    Patient Education  Education Given To: Patient  Education Provided: Plan of Care;Fall Prevention Strategies;Energy Conservation;Mobility Training;ADL Adaptive Strategies;Equipment;Role of Therapy;IADL Safety  Education Method: Verbal;Demonstration  Barriers to Learning: None  Education Outcome: Continued education needed;Verbalized understanding;Demonstrated understanding    Thank you for this referral.  Leeroy Piety, OTA  Minutes: 15   "

## 2024-06-13 LAB — POCT GLUCOSE
POC Glucose: 97 mg/dL (ref 65–100)
POC Glucose: 141 mg/dL — ABNORMAL HIGH (ref 65–100)
POC Glucose: 102 mg/dL — ABNORMAL HIGH (ref 65–100)
POC Glucose: 142 mg/dL — ABNORMAL HIGH (ref 65–100)
POC Glucose: 187 mg/dL — ABNORMAL HIGH (ref 65–100)
POC Glucose: 129 mg/dL — ABNORMAL HIGH (ref 65–100)

## 2024-06-13 MED FILL — METOPROLOL TARTRATE 50 MG PO TABS: 50 mg | ORAL | Qty: 1 | Fill #0

## 2024-06-13 MED FILL — DOXYCYCLINE HYCLATE 100 MG IV SOLR: 100 mg | INTRAVENOUS | Qty: 100 | Fill #0

## 2024-06-13 MED FILL — ELIQUIS 5 MG PO TABS: 5 mg | ORAL | Qty: 1 | Fill #0

## 2024-06-13 MED FILL — IPRATROPIUM-ALBUTEROL 0.5-2.5 (3) MG/3ML IN SOLN: 0.5-2.5 (3) MG/3ML | RESPIRATORY_TRACT | Qty: 3 | Fill #0

## 2024-06-13 MED FILL — INSULIN LISPRO 100 UNIT/ML IJ SOLN: 100 [IU]/mL | INTRAMUSCULAR | Qty: 1 | Fill #0

## 2024-06-13 MED FILL — MUCUS RELIEF ER 600 MG PO TB12: 600 mg | ORAL | Qty: 1 | Fill #0

## 2024-06-13 MED FILL — TYLENOL 325 MG PO TABS: 325 mg | ORAL | Qty: 2 | Fill #0

## 2024-06-13 MED FILL — LANTUS 100 UNIT/ML SC SOLN: 100 [IU]/mL | SUBCUTANEOUS | Qty: 10 | Fill #0

## 2024-06-13 MED FILL — OLANZAPINE 10 MG PO TABS: 10 mg | ORAL | Qty: 1 | Fill #0

## 2024-06-13 MED FILL — SERTRALINE HCL 50 MG PO TABS: 50 mg | ORAL | Qty: 2 | Fill #0

## 2024-06-13 MED FILL — ATORVASTATIN CALCIUM 40 MG PO TABS: 40 mg | ORAL | Qty: 1 | Fill #0

## 2024-06-13 MED FILL — BUDESONIDE 0.5 MG/2ML IN SUSP: 0.5 MG/2ML | RESPIRATORY_TRACT | Qty: 2 | Fill #0

## 2024-06-13 MED FILL — BUDESONIDE 0.5 MG/2ML IN SUSP: 0.5 MG/2ML | RESPIRATORY_TRACT | Qty: 4 | Fill #0

## 2024-06-13 NOTE — Progress Notes (Signed)
 "Pulmonary and Critical Care progress note    Subjective:   Consult Note: 06/13/2024 @no  control      Chief Complaint:   Chief Complaint   Patient presents with    Chest Pain        This patient has been seen and evaluated at the request of Dr. Phill    63 year old obese Caucasian lady  I am asked to see for acute respiratory failure with hypoxia and hypercapnia    Patient has a past medical history significant for chronic hypoxic respiratory failure on 3 L nasal cannula secondary to COPD, obstructive sleep apnea on CPAP, anxiety, depression, HTN, A-fib on Eliquis , T2DM, pancreatitis and EtOH cirrhosis     Presented to ED on 06/03/2024 for worsening dyspnea.  In ED, patient severely hypoxic with increased work of breathing which was refractory to BiPAP, requiring intubation.  CTA chest shows extensive bilateral pulmonary infiltrates concerning for severe bilateral pneumonia, however negative for PE.  Started on empiric IV ceftriaxone  and azithromycin .  Respiratory status improved and patient was able to be extubated to BiPAP and further decreased to home dose 3 L NC.  However, patient remained confused so psychiatry was consulted.  Started on Zoloft  and Zyprexa .  Stroke alert was called on 10/17 for left-sided weakness with complete left hemianopia.  CT head, CTA head/neck and CT brain perfusion with no acute findings.  Patient unable to tolerate MRI.  Neurology evaluated, consider EEG if clinical status worsening otherwise no further stroke work up.      Transferred to medical service on 10/20.  I was asked to see the patient to take over her pulmonary management by intensivist Dr. Azeem.    ABGs 06/09/2024 showed:  pH of 7.40, pCO2 60, pO2 81, bicarb 37, saturation 96% on BiPAP with 32% FiO2    Pt with increased work of breathing and lethargy on 10/21 with repeat ABG showing worsening hypercapnia of 60.  Started on nightly BiPAP with significant improvement in breathing and normalization of mental status.      Echocardiogram 05/05/2024 reviewed personally showing:  LVEF 70 to 75%  Dilated RA and RV    Last chest x-ray 06/05/24 reviewed personally showing:  Patient was still intubated at that time  Bilateral infiltrates    Currently on nasal cannula oxygen    06/11/2024:    Patient seen and examined  Overnight events noted    Sitting up in the bed comfortably  Having breakfast  On 3 L nasal cannula oxygen  Improved respiratory status  Much more awake and alert and interactive and answering questions appropriately  Has used BiPAP 16/8, 30% FiO2 overnight  ABGs reviewed showing pH of 7.40, pCO2 57, pO2 78, bicarb 34, saturation 95%    06/12/2024:    Patient seen and examined  Overnight events noted  Sitting up in bed comfortably  Awake and alert  Having breakfast  Seated nasal cannula oxygen  Used BiPAP 14/8, 40% FiO2 overnight  No acute distress  ABGs and imaging reviewed    06/13/2024:    Patient seen and examined  Overnight events noted  Sitting up in bed comfortably  Awake and alert  Says she did not use her BiPAP last night  Noninvasive ventilator has been ordered for the patient to DME company  Gentle diuresis  Admits to improving shortness of breath    Review of Systems:  Pertinent items are noted in HPI.    Past Medical History:   Diagnosis Date  Anxiety     Depression     Gastrointestinal disorder     Heart failure (HCC)     Hyperlipidemia     Hypertension     Liver disease 2009    liver failure    Pneumonia      Past Surgical History:   Procedure Laterality Date    ADENOIDECTOMY  1972    BACK SURGERY      cement discs following a fall    CATARACT REMOVAL      CESAREAN SECTION  1986 and 1993    DILATION AND CURETTAGE OF UTERUS  1999    EGD TRANSORAL BIOPSY SINGLE/MULTIPLE  05/17/2009         HYSTERECTOMY (CERVIX STATUS UNKNOWN)  2008    TONSILLECTOMY  1972    UPPER GASTROINTESTINAL ENDOSCOPY N/A 04/17/2024    ESOPHAGOGASTRODUODENOSCOPY performed by Madlyn Fendt, MD at [facility] ENDOSCOPY      Family History   Problem  Relation Age of Onset    Cancer Mother     Heart Disease Father      Social History     Tobacco Use    Smoking status: Former     Current packs/day: 0.00     Types: Cigarettes     Quit date: 02/28/2008     Years since quitting: 16.3    Smokeless tobacco: Never   Substance Use Topics    Alcohol use: Never      Current Facility-Administered Medications   Medication Dose Route Frequency Provider Last Rate Last Admin    ipratropium 0.5 mg-albuterol  2.5 mg (DUONEB ) nebulizer solution 1 Dose  1 Dose Inhalation BID RT Aimee Elspeth SAUNDERS, MD   1 Dose at 06/13/24 0801    guaiFENesin  (MUCINEX ) extended release tablet 600 mg  600 mg Oral BID Gillard, Madison A, PA-C   600 mg at 06/13/24 9088    benzonatate  (TESSALON ) capsule 100 mg  100 mg Oral TID PRN Ronn Dixon A, PA-C   100 mg at 06/10/24 1217    doxycycline  (VIBRAMYCIN ) 100 mg in sodium chloride  0.9 % 100 mL IVPB (addEASE)  100 mg IntraVENous BID Sherrey North A, MD 100 mL/hr at 06/13/24 0919 100 mg at 06/13/24 0919    influenza tiss-cult vaccine (FLUCELVAX) injection 0.5 mL  1 Dose IntraMUSCular Prior to discharge Aimee Elspeth SAUNDERS, MD        metoprolol  tartrate (LOPRESSOR ) tablet 50 mg  50 mg Oral BID Azeem, Ahad, MD   50 mg at 06/13/24 0911    OLANZapine  (ZYPREXA ) tablet 10 mg  10 mg Oral Nightly Punyala, Srinivasa R, MD   10 mg at 06/12/24 2041    atorvastatin  (LIPITOR ) tablet 40 mg  40 mg Oral Nightly Azeem, Ahad, MD   40 mg at 06/12/24 2041    haloperidol  lactate (HALDOL ) injection 5 mg  5 mg IntraVENous Q6H PRN Azeem, Ahad, MD   5 mg at 06/08/24 0438    budesonide  (PULMICORT ) nebulizer suspension 500 mcg  0.5 mg Nebulization BID RT Nunna, Krishidhar R, MD   500 mcg at 06/13/24 0801    insulin  glargine (LANTUS ) injection vial 10 Units  10 Units SubCUTAneous Daily Nunna, Krishidhar R, MD   10 Units at 06/13/24 0912    ipratropium 0.5 mg-albuterol  2.5 mg (DUONEB ) nebulizer solution 1 Dose  1 Dose Inhalation Q4H PRN Nunna, Krishidhar R, MD   1 Dose at 06/08/24 0304     insulin  lispro (HUMALOG ,ADMELOG ) injection vial 0-8 Units  0-8 Units SubCUTAneous Q4H Nunna,  Veleta SAUNDERS, MD   2 Units at 06/07/24 2042    0.9 % sodium chloride  infusion   IntraVENous PRN Nunna, Krishidhar R, MD        albuterol  (PROVENTIL ) nebulizer solution 2.5 mg  2.5 mg Nebulization Q4H PRN Nunna, Krishidhar R, MD        [Held by provider] tiotropium bromide  (SPIRIVA  RESPIMAT) 2.5 MCG/ACT inhaler 2 puff  2 puff Inhalation Daily RT Nunna, Krishidhar R, MD        [Held by provider] budesonide -formoterol  (SYMBICORT ) 160-4.5 MCG/ACT inhaler 2 puff  2 puff Inhalation BID RT Nunna, Krishidhar R, MD        hydrALAZINE  (APRESOLINE ) injection 10 mg  10 mg IntraVENous Q4H PRN Sahib, Taaliba A, APRN - NP   10 mg at 06/07/24 0202    glucose chewable tablet 16 g  4 tablet Oral PRN Nunna, Krishidhar R, MD   16 g at 06/12/24 0245    dextrose  bolus 10% 125 mL  125 mL IntraVENous PRN Nunna, Krishidhar R, MD        Or    dextrose  bolus 10% 250 mL  250 mL IntraVENous PRN Nunna, Krishidhar R, MD        glucagon  injection 1 mg  1 mg SubCUTAneous PRN Nunna, Krishidhar R, MD        dextrose  10 % infusion   IntraVENous Continuous PRN Nunna, Krishidhar R, MD        sertraline  (ZOLOFT ) tablet 100 mg  100 mg Oral Daily Nunna, Krishidhar R, MD   100 mg at 06/13/24 9088    apixaban  (ELIQUIS ) tablet 5 mg  5 mg Per NG tube BID Nunna, Krishidhar R, MD   5 mg at 06/13/24 0911    sodium chloride  flush 0.9 % injection 5-40 mL  5-40 mL IntraVENous 2 times per day Marvis Lynwood HERO, APRN - CNP   10 mL at 06/13/24 0920    sodium chloride  flush 0.9 % injection 5-40 mL  5-40 mL IntraVENous PRN Marvis Lynwood HERO, APRN - CNP        0.9 % sodium chloride  infusion   IntraVENous PRN Marvis Lynwood HERO, APRN - CNP        lidocaine  1 % injection 50 mg  50 mg IntraDERmal Once Marvis Lynwood HERO, APRN - CNP        ALTEplase  (CATHFLO) injection 2 mg  2 mg IntraCATHeter PRN Marvis Lynwood HERO, APRN - CNP        sodium chloride  flush 0.9 % injection 5-40 mL  5-40 mL IntraVENous PRN  Marvis Lynwood HERO, APRN - CNP        0.9 % sodium chloride  infusion   IntraVENous PRN Marvis Lynwood HERO, APRN - CNP   Stopped at 06/05/24 2344    potassium chloride  20 mEq/50 mL IVPB (Central Line)  20 mEq IntraVENous PRN Marvis Lynwood HERO, APRN - CNP        Or    potassium chloride  10 mEq/100 mL IVPB (Peripheral Line)  10 mEq IntraVENous PRN Marvis Lynwood HERO, APRN - CNP        magnesium  sulfate 2000 mg in 50 mL IVPB premix  2,000 mg IntraVENous PRN Marvis Lynwood HERO, APRN - CNP        ondansetron  (ZOFRAN -ODT) disintegrating tablet 4 mg  4 mg Oral Q8H PRN Marvis Lynwood HERO, APRN - CNP        Or    ondansetron  (ZOFRAN ) injection 4 mg  4 mg IntraVENous Q6H PRN  Marvis Lynwood HERO, APRN - CNP   4 mg at 06/07/24 1021    polyethylene glycol (GLYCOLAX ) packet 17 g  17 g Oral Daily PRN Marvis Lynwood HERO, APRN - CNP        acetaminophen  (TYLENOL ) tablet 650 mg  650 mg Oral Q6H PRN Marvis Lynwood HERO, APRN - CNP   650 mg at 06/13/24 9085    Or    acetaminophen  (TYLENOL ) suppository 650 mg  650 mg Rectal Q6H PRN Marvis Lynwood HERO, APRN - CNP              Allergies   Allergen Reactions    Latex Rash           Objective:     Blood pressure (!) 113/92, pulse 63, temperature 98.4 F (36.9 C), temperature source Oral, resp. rate 18, height 1.575 m (5' 2), weight 112.5 kg (248 lb 0.3 oz), SpO2 91%. Temp (24hrs), Avg:98.2 F (36.8 C), Min:97.7 F (36.5 C), Max:98.8 F (37.1 C)      Intake and Output:  Current Shift: No intake/output data recorded.  Last 3 Shifts: 10/22 1901 - 10/24 0700  In: 342 [P.O.:222; I.V.:20]  Out: 600 [Urine:600]  @INTAKEOUTPUTBRIEF @     Physical Exam:     General: Lying in bed comfortably, mild respiratory distress.  On nasal cannula oxygen.  Obese  Eye: Reactive, symmetric  Throat and Neck: Supple  Lung: Reduced air entry bilaterally with prolonged exhalation.  Occasional wheezing.  Bilateral crackles   Heart: S1+S2.  No murmurs  Abdomen: soft, non-tender. Bowel sounds normal. No masses; obese  Extremities: No edema  GU: Not done  Skin: No  cyanosis  Neurologic: A & O x3.  Grossly nonfocal  Psychiatric: Mildly anxious      Lab/Data Review:      Recent Results (from the past 24 hours)   POCT Glucose    Collection Time: 06/12/24 11:38 AM   Result Value Ref Range    POC Glucose 84 65 - 100 mg/dL    Performed by: Belvie Lesches    POCT Glucose    Collection Time: 06/12/24  4:39 PM   Result Value Ref Range    POC Glucose 154 (H) 65 - 100 mg/dL    Performed by: Archibald Levan    POCT Glucose    Collection Time: 06/12/24  8:37 PM   Result Value Ref Range    POC Glucose 97 65 - 100 mg/dL    Performed by: Waddell Age    POCT Glucose    Collection Time: 06/12/24 11:25 PM   Result Value Ref Range    POC Glucose 141 (H) 65 - 100 mg/dL    Performed by: Waddell Age    POCT Glucose    Collection Time: 06/13/24  3:48 AM   Result Value Ref Range    POC Glucose 102 (H) 65 - 100 mg/dL    Performed by: Waddell Age    POCT Glucose    Collection Time: 06/13/24  8:24 AM   Result Value Ref Range    POC Glucose 142 (H) 65 - 100 mg/dL    Performed by: Belvie Lesches        XR CHEST PORTABLE   Final Result      Persistent but improved diffuse bilateral pulmonary infiltrates.         Electronically signed by DARICE COLON      CTA HEAD NECK W CONTRAST   Final Result  1. No acute large vessel arterial occlusion. Mild to moderate atherosclerotic   changes.   2. Pulmonary hypertension.   3. Biapical airspace disease likely infectious/inflammatory.               Electronically signed by Aletha CHRISTELLA Ham      CT BRAIN PERFUSION   Final Result         1. No acute large vessel arterial occlusion. Mild to moderate atherosclerotic   changes.   2. Pulmonary hypertension.   3. Biapical airspace disease likely infectious/inflammatory.               Electronically signed by Aletha CHRISTELLA Ham      CT HEAD WO CONTRAST   Final Result   No acute intracranial findings.         Electronically signed by PAULINE BLANCH      XR CHEST PORTABLE   Final Result   1. No change bilateral  pneumonia         Electronically signed by Evalene JINNY Hoit      CT HEAD WO CONTRAST   Final Result      No acute intracranial abnormality.         Electronically signed by Toribio JINNY Musick      CTA CHEST W WO CONTRAST   Final Result   Extensive bilateral pulmonary infiltrates. No evidence of pulmonary embolism.         Electronically signed by DARICE COLON      XR CHEST PORTABLE   Final Result   1. Enlarged cardiac silhouette, worsening airspace disease or pulmonary edema.   No complicating features post intubation            Electronically signed by Susie Brain      XR CHEST PORTABLE   Final Result   Cardiomegaly and mild interstitial pulmonary edema.             Electronically signed by JIMMY HABIB            Assessment:     1.  Acute on chronic respiratory failure with hypercapnia  2.  Acute on chronic respiratory failure with hypoxia  3.  Bilateral pneumonia  4.  Acute metabolic encephalopathy  5.  Acute exacerbation of COPD  6.  Obstructive sleep apnea  7.  Liver cirrhosis  8.  Chronic atrial fibrillation  9.  Obesity  10.  Polysubstance abuse  11.  Anemia and thrombocytopenia    Plan:     Patient seen on the medical floor  Being watched here closely    On BiPAP 16/8, 30% FiO2 overnight  Now on 3 L nasal cannula oxygen  Mental status much improved  ABGs showing persistent hypercapnic respiratory failure despite BiPAP which represents failure of BiPAP. I believe the patient's respiratory failure with hypercapnia is due to underlying COPD.  I believe she will benefit from noninvasive positive pressure ventilator at discharge for home use for target tidal volume, backup battery, and close monitoring to help reduce the risk of exacerbations, hospitalizations, and mortality.  Case management helping in obtaining noninvasive positive pressure ventilation for home use.  Should be delivered today.    Bilateral pneumonia  Continue broad-spectrum antibiotics  Cultures sent  Expanded pneumonia workup sent  Further changes  in antibiotics based on clinical response and culture results  Last chest x-ray showing extensive bilateral infiltrates  Will repeat chest x-ray this a.m.  Start doxycycline   Underlying COPD  Continue Solu-Medrol   Continue  nebulizers  Will need PFTs in outpatient after discharge    Altered mental status likely combination of hypoxia, hypercapnia, polysubstance abuse  UDS positive for amphetamines  Ammonia normal  Neurology evaluated recommended EEG depending on clinical status  Psychiatry following and have recommended Xolox and Zyprexa   Monitor neurological status with treatment    Liver cirrhosis  Will need to see GI in outpatient  Stable for now    DVT and GI prophylaxis    Clinically improved  Out of bed to chair with PT OT  Will follow-up with me in outpatient clinic after discharge for PFTs and sleep evaluation    Questions of patient were answered at bedside in detail  Case discussed in detail with RN, RT, and care team including hospitalist  Thank you for involving me in the care of this patient  I will follow with you closely during hospitalization    Time spent more than 75 minutes in direct patient care with no overlap reviewing results and records, decision making, and answering questions.      MANCEL DELENA HAIR, MD  [facility] (PAT)  06/13/2024  9:47 AM             "

## 2024-06-13 NOTE — Progress Notes (Signed)
 "  IMPRESSION:   Acute on chronic hypoxic respiratory failure  Chronic hypercapnic respiratory failure  Bilateral pneumonia improved  Cirrhosis of liver  Chronic A-fib  Additional workup outlined below      RECOMMENDATIONS/PLAN:   63 year old obese lady came in because of shortness of breath and dyspnea she is chronically on home oxygen she did not have any sleep study done in the past she has history of hypertension chronic A-fib history of EtOH cirrhosis of liver, patient also has COPD she was severely hypoxic and she was put on noninvasive ventilator CAT scan of the chest was done which showed bilateral pneumonia patient was intubated and successfully weaned off and she is using BiPAP at night and oxygen during the daytime  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 CT head was negative  Continue use antibiotic for bilateral pneumonia  Nocturnal CPAP she never had any sleep study done  Patient has had worsening respiratory failure and is at risk for serious harm or death without appropriate therapy at home which include noninvasive ventilator while patient has an underlying condition of OSA it is not the primary indication for need for NIV patient has previously tried CPAP and BiPAP and has failed to improve his hypercapnia and saturation level.  Since chronic/acute respiratory failure can be prevented with the use of positive pressure ventilation and due to the nature of the patient deteriorating condition a portable volume ventilator has been ordered via mask interface for use at night as well as during the day.  The patient requires continuous access to noninvasive positive pressure ventilation therapy to reduce respiratory distress, minimize hospitalization and to control carbon dioxide retention.  Noninvasive ventilator setting BiPAP at an IPAP of 15-20 cm H2O and EPAP of 3-5 cm H2O, gradually increasing pressures to target an oxygen saturation (SpO2) of 88-92% and improved pH and PaCO2.  Higher IPAP levels (up to 25-30 cm H2O) and higher EPAP may be needed for severe hypercapnia    Continue with Bumex   Overnight pulse oximetry shows desaturation for 24 minutes qualify for home oxygen     10/22  Alert awake eating breakfast on oxygen via nasal cannula getting nebulizer treatment use the noninvasive ventilator BiPAP her pCO2 was 60 discussed with her about getting noninvasive ventilator will order one but issue is the compliance, she had a history of polysubstance abuse and will do 6-minute walk test before discharge to see if she qualifies for POC.  10/23  Patient seen and examined at the bedside getting nebulizer treatment RT was present in the room denies any chest pain shortness of breath awaiting for noninvasive ventilator which has been ordered discussed with her about her condition and using inhalers and compliance with the noninvasive ventilator once patient get the noninvasive ventilator she can be discharged patient getting nebulizer treatment also on doxycycline .  Culture positive most likely contamination continue with the Bumex .  Recommend abstinence from substance abuse.  10/24   Patient was alert and awake at room air. She is tolerating a full diet. She denies any chest pain, shortness of breath. She is awaiting her noninvasive ventilator which has been ordered. Discussed inhaler use and compliance with the ventilator upon discharge. Continue with the bumex . Recommend abstinence from substance abuse.  Okay to discharge    [x]  High complexity decision making was performed  [x]  See my orders for details    PMH:  has a past medical history of Anxiety, Depression, Gastrointestinal disorder, Heart failure (HCC), Hyperlipidemia, Hypertension,  Liver disease, and Pneumonia.    PSH:   has a past surgical history that includes Cataract removal; egd transoral biopsy single/multiple (05/17/2009); Dilation and curettage of uterus (1999); Hysterectomy (2008); Cesarean section (1986 and 1993);  Adenoidectomy (1972); Tonsillectomy (1972); back surgery; and Upper gastrointestinal endoscopy (N/A, 04/17/2024).     FHX: family history includes Cancer in her mother; Heart Disease in her father.     SHX:  reports that she quit smoking about 16 years ago. Her smoking use included cigarettes. She has never used smokeless tobacco. She reports that she does not drink alcohol and does not use drugs.    ALL:   Allergies   Allergen Reactions    Latex Rash        MEDS:   [x]  Reviewed - As Below   []  Not reviewed    Current Facility-Administered Medications   Medication Dose Route Frequency Provider Last Rate Last Admin    ipratropium 0.5 mg-albuterol  2.5 mg (DUONEB ) nebulizer solution 1 Dose  1 Dose Inhalation BID RT Aimee Elspeth SAUNDERS, MD   1 Dose at 06/13/24 0801    guaiFENesin  (MUCINEX ) extended release tablet 600 mg  600 mg Oral BID Ronn Dixon A, PA-C   600 mg at 06/13/24 0911    benzonatate  (TESSALON ) capsule 100 mg  100 mg Oral TID PRN Ronn Dixon A, PA-C   100 mg at 06/10/24 1217    doxycycline  (VIBRAMYCIN ) 100 mg in sodium chloride  0.9 % 100 mL IVPB (addEASE)  100 mg IntraVENous BID Mauricio Mancel LABOR, MD   Stopped at 06/12/24 2157    influenza tiss-cult vaccine (FLUCELVAX) injection 0.5 mL  1 Dose IntraMUSCular Prior to discharge Aimee Elspeth SAUNDERS, MD        metoprolol  tartrate (LOPRESSOR ) tablet 50 mg  50 mg Oral BID Azeem, Ahad, MD   50 mg at 06/13/24 0911    OLANZapine  (ZYPREXA ) tablet 10 mg  10 mg Oral Nightly Punyala, Srinivasa R, MD   10 mg at 06/12/24 2041    atorvastatin  (LIPITOR ) tablet 40 mg  40 mg Oral Nightly Azeem, Ahad, MD   40 mg at 06/12/24 2041    haloperidol  lactate (HALDOL ) injection 5 mg  5 mg IntraVENous Q6H PRN Azeem, Ahad, MD   5 mg at 06/08/24 0438    budesonide  (PULMICORT ) nebulizer suspension 500 mcg  0.5 mg Nebulization BID RT Nunna, Krishidhar R, MD   500 mcg at 06/13/24 0801    insulin  glargine (LANTUS ) injection vial 10 Units  10 Units SubCUTAneous Daily Nunna, Krishidhar R, MD    10 Units at 06/13/24 0912    ipratropium 0.5 mg-albuterol  2.5 mg (DUONEB ) nebulizer solution 1 Dose  1 Dose Inhalation Q4H PRN Nunna, Krishidhar R, MD   1 Dose at 06/08/24 0304    insulin  lispro (HUMALOG ,ADMELOG ) injection vial 0-8 Units  0-8 Units SubCUTAneous Q4H Nunna, Krishidhar R, MD   2 Units at 06/07/24 2042    0.9 % sodium chloride  infusion   IntraVENous PRN Nunna, Krishidhar R, MD        albuterol  (PROVENTIL ) nebulizer solution 2.5 mg  2.5 mg Nebulization Q4H PRN Nunna, Krishidhar R, MD        [Held by provider] tiotropium bromide  (SPIRIVA  RESPIMAT) 2.5 MCG/ACT inhaler 2 puff  2 puff Inhalation Daily RT Nunna, Veleta SAUNDERS, MD        [Held by provider] budesonide -formoterol  (SYMBICORT ) 160-4.5 MCG/ACT inhaler 2 puff  2 puff Inhalation BID RT Rawland Veleta SAUNDERS, MD  hydrALAZINE  (APRESOLINE ) injection 10 mg  10 mg IntraVENous Q4H PRN Sahib, Taaliba A, APRN - NP   10 mg at 06/07/24 0202    glucose chewable tablet 16 g  4 tablet Oral PRN Nunna, Krishidhar R, MD   16 g at 06/12/24 0245    dextrose  bolus 10% 125 mL  125 mL IntraVENous PRN Nunna, Krishidhar R, MD        Or    dextrose  bolus 10% 250 mL  250 mL IntraVENous PRN Rawland, Krishidhar R, MD        glucagon  injection 1 mg  1 mg SubCUTAneous PRN Nunna, Krishidhar R, MD        dextrose  10 % infusion   IntraVENous Continuous PRN Nunna, Krishidhar R, MD        sertraline  (ZOLOFT ) tablet 100 mg  100 mg Oral Daily Nunna, Krishidhar R, MD   100 mg at 06/13/24 9088    apixaban  (ELIQUIS ) tablet 5 mg  5 mg Per NG tube BID Nunna, Krishidhar R, MD   5 mg at 06/13/24 9088    sodium chloride  flush 0.9 % injection 5-40 mL  5-40 mL IntraVENous 2 times per day Marvis Lynwood HERO, APRN - CNP   10 mL at 06/12/24 2056    sodium chloride  flush 0.9 % injection 5-40 mL  5-40 mL IntraVENous PRN Marvis Lynwood HERO, APRN - CNP        0.9 % sodium chloride  infusion   IntraVENous PRN Marvis Lynwood HERO, APRN - CNP        lidocaine  1 % injection 50 mg  50 mg IntraDERmal Once Marvis Lynwood HERO,  APRN - CNP        ALTEplase  (CATHFLO) injection 2 mg  2 mg IntraCATHeter PRN Marvis Lynwood HERO, APRN - CNP        sodium chloride  flush 0.9 % injection 5-40 mL  5-40 mL IntraVENous PRN Marvis Lynwood HERO, APRN - CNP        0.9 % sodium chloride  infusion   IntraVENous PRN Marvis Lynwood HERO, APRN - CNP   Stopped at 06/05/24 2344    potassium chloride  20 mEq/50 mL IVPB (Central Line)  20 mEq IntraVENous PRN Marvis Lynwood HERO, APRN - CNP        Or    potassium chloride  10 mEq/100 mL IVPB (Peripheral Line)  10 mEq IntraVENous PRN Marvis Lynwood HERO, APRN - CNP        magnesium  sulfate 2000 mg in 50 mL IVPB premix  2,000 mg IntraVENous PRN Marvis Lynwood HERO, APRN - CNP        ondansetron  (ZOFRAN -ODT) disintegrating tablet 4 mg  4 mg Oral Q8H PRN Marvis Lynwood HERO, APRN - CNP        Or    ondansetron  (ZOFRAN ) injection 4 mg  4 mg IntraVENous Q6H PRN Marvis Lynwood HERO, APRN - CNP   4 mg at 06/07/24 1021    polyethylene glycol (GLYCOLAX ) packet 17 g  17 g Oral Daily PRN Marvis Lynwood HERO, APRN - CNP        acetaminophen  (TYLENOL ) tablet 650 mg  650 mg Oral Q6H PRN Marvis Lynwood HERO, APRN - CNP   650 mg at 06/13/24 9085    Or    acetaminophen  (TYLENOL ) suppository 650 mg  650 mg Rectal Q6H PRN Marvis Lynwood HERO, APRN - CNP            MAR reviewed and pertinent medications noted or modified as needed  MND:Ezmupwzwu items are noted in HPI.      Hemodynamics:    CO:    CI:    CVP:    SVR:   PAP Systolic:    PAP Diastolic:    PVR:    SV02:        Ventilator Settings:      Mode Rate TV Press PEEP FiO2 PIP Min. Vent   (S) CPAP/PS 16 bpm  380 mL    6 30 %  13 cmH2O           Vital Signs: Telemetry:    AFIB Intake/Output:   BP (!) 113/92 Comment: Nurse was notified  Pulse 63   Temp 98.4 F (36.9 C) (Oral)   Resp 18   Ht 1.575 m (5' 2)   Wt 112.5 kg (248 lb 0.3 oz)   SpO2 91%   BMI 45.35 kg/m     Temp (24hrs), Avg:98.2 F (36.8 C), Min:97.7 F (36.5 C), Max:98.8 F (37.1 C)        O2 Device: Nasal cannula O2 Flow Rate (L/min): 3 L/min       Wt Readings from Last  4 Encounters:   06/13/24 112.5 kg (248 lb 0.3 oz)   06/06/24 126.6 kg (279 lb 1.6 oz)   05/03/24 129.7 kg (286 lb)   05/05/24 129.7 kg (286 lb)          Intake/Output Summary (Last 24 hours) at 06/13/2024 0917  Last data filed at 06/13/2024 0457  Gross per 24 hour   Intake 332 ml   Output 200 ml   Net 132 ml       Last shift:      No intake/output data recorded.  Last 3 shifts: 10/22 1901 - 10/24 0700  In: 342 [P.O.:222; I.V.:20]  Out: 600 [Urine:600]       Physical Exam:     General: Caucasian female; alert awake on oxygen via nasal cannula  HEENT: NCAT, poor dentition, lips and mucosa dry  Eyes: anicteric; conjunctiva clear  Neck: no nodes, trach midline; no accessory MM use.  Chest: no deformity,  Cardiac: IR regular; no murmur;   Lungs: distant breath sounds; bilateral rales  Abd: soft, NT, hypoactive BS  Ext: Bilateral leg edema; no joint swelling; No clubbing  GU: NO foley, clear urine  Neuro: No focal deficit  Psych- no agitation, oriented to person;   Skin: warm, dry, no cyanosis;   Pulses: 1-2+ Bilateral pedal, radial  Capillary: brisk; pale      DATA:  No results found for this or any previous visit.    05/02/24    ECHO (TTE) COMPLETE (PRN CONTRAST/BUBBLE/STRAIN/3D) 05/06/2024 10:52 AM (Final)    Interpretation Summary    Left Ventricle: Normal left ventricular systolic function with a visually estimated EF of 70 - 75%. Left ventricle size is normal. Normal wall thickness. Normal wall motion. Normal diastolic function.    Right Ventricle: Right ventricle is moderately dilated. Normal systolic function.    Right Atrium: Right atrium is mildly dilated.    Pericardium: Moderate (1-2 cm) localized pericardial effusion present around the left ventricle. Pericardial posterior effusion measures 1.0 cm.    Image quality is adequate. Contrast used: Lumason . Technically difficult study, technically difficult study with poor endocardial visualization, technically difficult study due to patient's body habitus and  technically difficult study due to patient's heart rhythm.    Signed by: Laquetta Commander, MD on 05/06/2024 10:52 AM       MAR reviewed  and pertinent medications noted or modified as needed    MEDS:   Current Facility-Administered Medications   Medication Dose Route Frequency Provider Last Rate Last Admin    ipratropium 0.5 mg-albuterol  2.5 mg (DUONEB ) nebulizer solution 1 Dose  1 Dose Inhalation BID RT Aimee Elspeth SAUNDERS, MD   1 Dose at 06/13/24 0801    guaiFENesin  (MUCINEX ) extended release tablet 600 mg  600 mg Oral BID Gillard, Madison A, PA-C   600 mg at 06/13/24 0911    benzonatate  (TESSALON ) capsule 100 mg  100 mg Oral TID PRN Ronn Dixon A, PA-C   100 mg at 06/10/24 1217    doxycycline  (VIBRAMYCIN ) 100 mg in sodium chloride  0.9 % 100 mL IVPB (addEASE)  100 mg IntraVENous BID Mauricio Mancel LABOR, MD   Stopped at 06/12/24 2157    influenza tiss-cult vaccine (FLUCELVAX) injection 0.5 mL  1 Dose IntraMUSCular Prior to discharge Aimee Elspeth SAUNDERS, MD        metoprolol  tartrate (LOPRESSOR ) tablet 50 mg  50 mg Oral BID Azeem, Ahad, MD   50 mg at 06/13/24 0911    OLANZapine  (ZYPREXA ) tablet 10 mg  10 mg Oral Nightly Punyala, Srinivasa R, MD   10 mg at 06/12/24 2041    atorvastatin  (LIPITOR ) tablet 40 mg  40 mg Oral Nightly Azeem, Ahad, MD   40 mg at 06/12/24 2041    haloperidol  lactate (HALDOL ) injection 5 mg  5 mg IntraVENous Q6H PRN Azeem, Ahad, MD   5 mg at 06/08/24 0438    budesonide  (PULMICORT ) nebulizer suspension 500 mcg  0.5 mg Nebulization BID RT Nunna, Krishidhar R, MD   500 mcg at 06/13/24 0801    insulin  glargine (LANTUS ) injection vial 10 Units  10 Units SubCUTAneous Daily Nunna, Krishidhar R, MD   10 Units at 06/13/24 0912    ipratropium 0.5 mg-albuterol  2.5 mg (DUONEB ) nebulizer solution 1 Dose  1 Dose Inhalation Q4H PRN Nunna, Krishidhar R, MD   1 Dose at 06/08/24 0304    insulin  lispro (HUMALOG ,ADMELOG ) injection vial 0-8 Units  0-8 Units SubCUTAneous Q4H Nunna, Krishidhar R, MD   2 Units at 06/07/24  2042    0.9 % sodium chloride  infusion   IntraVENous PRN Nunna, Krishidhar R, MD        albuterol  (PROVENTIL ) nebulizer solution 2.5 mg  2.5 mg Nebulization Q4H PRN Nunna, Krishidhar R, MD        [Held by provider] tiotropium bromide  (SPIRIVA  RESPIMAT) 2.5 MCG/ACT inhaler 2 puff  2 puff Inhalation Daily RT Rawland Veleta SAUNDERS, MD        [Held by provider] budesonide -formoterol  (SYMBICORT ) 160-4.5 MCG/ACT inhaler 2 puff  2 puff Inhalation BID RT Nunna, Krishidhar R, MD        hydrALAZINE  (APRESOLINE ) injection 10 mg  10 mg IntraVENous Q4H PRN Sahib, Taaliba A, APRN - NP   10 mg at 06/07/24 0202    glucose chewable tablet 16 g  4 tablet Oral PRN Nunna, Krishidhar R, MD   16 g at 06/12/24 0245    dextrose  bolus 10% 125 mL  125 mL IntraVENous PRN Nunna, Krishidhar R, MD        Or    dextrose  bolus 10% 250 mL  250 mL IntraVENous PRN Nunna, Krishidhar R, MD        glucagon  injection 1 mg  1 mg SubCUTAneous PRN Nunna, Krishidhar R, MD        dextrose  10 % infusion   IntraVENous Continuous PRN Nunna, Krishidhar  R, MD        sertraline  (ZOLOFT ) tablet 100 mg  100 mg Oral Daily Rawland, Krishidhar R, MD   100 mg at 06/13/24 9088    apixaban  (ELIQUIS ) tablet 5 mg  5 mg Per NG tube BID Rawland Anton R, MD   5 mg at 06/13/24 0911    sodium chloride  flush 0.9 % injection 5-40 mL  5-40 mL IntraVENous 2 times per day Marvis Lynwood HERO, APRN - CNP   10 mL at 06/12/24 2056    sodium chloride  flush 0.9 % injection 5-40 mL  5-40 mL IntraVENous PRN Marvis Lynwood HERO, APRN - CNP        0.9 % sodium chloride  infusion   IntraVENous PRN Marvis Lynwood HERO, APRN - CNP        lidocaine  1 % injection 50 mg  50 mg IntraDERmal Once Marvis Lynwood HERO, APRN - CNP        ALTEplase  (CATHFLO) injection 2 mg  2 mg IntraCATHeter PRN Marvis Lynwood HERO, APRN - CNP        sodium chloride  flush 0.9 % injection 5-40 mL  5-40 mL IntraVENous PRN Marvis Lynwood HERO, APRN - CNP        0.9 % sodium chloride  infusion   IntraVENous PRN Marvis Lynwood HERO, APRN - CNP   Stopped at 06/05/24 2344     potassium chloride  20 mEq/50 mL IVPB (Central Line)  20 mEq IntraVENous PRN Marvis Lynwood HERO, APRN - CNP        Or    potassium chloride  10 mEq/100 mL IVPB (Peripheral Line)  10 mEq IntraVENous PRN Marvis Lynwood HERO, APRN - CNP        magnesium  sulfate 2000 mg in 50 mL IVPB premix  2,000 mg IntraVENous PRN Marvis Lynwood HERO, APRN - CNP        ondansetron  (ZOFRAN -ODT) disintegrating tablet 4 mg  4 mg Oral Q8H PRN Marvis Lynwood HERO, APRN - CNP        Or    ondansetron  (ZOFRAN ) injection 4 mg  4 mg IntraVENous Q6H PRN Marvis Lynwood HERO, APRN - CNP   4 mg at 06/07/24 1021    polyethylene glycol (GLYCOLAX ) packet 17 g  17 g Oral Daily PRN Marvis Lynwood HERO, APRN - CNP        acetaminophen  (TYLENOL ) tablet 650 mg  650 mg Oral Q6H PRN Marvis Lynwood HERO, APRN - CNP   650 mg at 06/13/24 9085    Or    acetaminophen  (TYLENOL ) suppository 650 mg  650 mg Rectal Q6H PRN Marvis Lynwood HERO, APRN - CNP            ARTERIAL BLOOD GAS  Recent Labs     06/11/24  0411   PHART 7.40   PCO2ART 57*   PO2ART 78*   HCO3ART 34*   BEART 8.0*   FIO2A 32   O2SATART 95   OXYHEM 93.4*   CARBOXHGBART 0.9*   METHGBART 0.4   TEMP 98.4     Labs:    Recent Labs     06/11/24  0901 06/12/24  0433   WBC 8.4 6.8   HGB 11.2* 11.8   PLT 146* 103*     Recent Labs     06/11/24  0901 06/12/24  0433   NA 142 140   K 3.6 3.7   CL 99 103   CO2 36* 26   GLUCOSE 114* 102*   BUN 20 17  CREATININE 0.68 0.69   CALCIUM  8.9 8.5*     No results for input(s): CKTOTAL, CKMB, CKMBINDEX, TROPONINI in the last 72 hours.  Lab Results   Component Value Date/Time    PROBNP 11,914 06/03/2024 05:54 PM      Lab Results   Component Value Date/Time    TSH 2.400 06/09/2024 01:50 PM      Results       Procedure Component Value Units Date/Time    Culture, Blood 1 [[id number]] Collected: 06/09/24 1420    Order Status: Sent Specimen: Blood Updated: 06/09/24 1451    Legionella antigen, urine [[id number]] Collected: 06/04/24 1228    Order Status: Completed Specimen: Urine Updated: 06/06/24 1636     Sample Site  Urine        L pneumophila S1 Ag, Ur Negative        Comment: Presumptive negative for L. pneumophila serogroup 1 antigen in urine,  suggesting no recent or current infection. Legionnaires' disease  cannot be ruled out since other serogroups and species may also  cause disease.  Performed At: [facility]  [address] [place], [place] [id number]  Jennette Shorter MD Ey:[phone]         S. pneumonia Antigen, Urine/CSF [[id number]] Collected: 06/04/24 1228    Order Status: Completed Specimen: Urine Updated: 06/06/24 1636     Sample Site Urine        Specimen Urine     S pneumoniae Ag, CSF Negative        FLUID CULTURE Not indicated.     Organism Id Not indicated.     Please note: Comment        Comment: College [employer]ologists standards require a culture to be  performed on CSF specimens submitted for bacterial antigen testing.  (CAP [id number]) Urine specimens will not be cultured.  Performed At: [facility]  [address] [place], [place] [id number]  Jennette Shorter MD Ey:[phone]         COVID-19 & Influenza Combo [[id number]] Collected: 06/04/24 0845    Order Status: Completed Specimen: Nasopharynx Updated: 06/04/24 0934     SARS-CoV-2, PCR Not Detected        Comment: Not Detected results do not preclude SARS-CoV-2 infection and should not be used as the sole basis for patient management decisions. Results must be combined with clinical observations, patient history, and epidemiological information        Rapid Influenza A By PCR Not Detected        Rapid Influenza B By PCR Not Detected        Comment:    Testing was performed using cobas Liat SARS-CoV-2 and Influenza A/B nucleic acid assay.  This test is a multiplex Real-Time Reverse Transcriptas         Culture, Blood 2 [[id number]]  (Abnormal) Collected: 06/04/24 0300    Order Status: Completed Specimen: Blood Updated: 06/06/24 0836     Special Requests No Special Requests        Culture       Staphylococcus species, coagulase  negative growing in 1 of 2 bottles drawn No Site Indicated                  This isolate represents a common contaminant and may or may not be clinically significant. Please notify the laboratory if further work is indicated.            (NOTE) CALLED TO ;CAMBPELL IRBY RN@[email or web address] .AM    Culture, Blood, PCR  ID Panel [[id number]]  (Abnormal) Collected: 06/04/24 0300    Order Status: Completed Specimen: Blood Updated: 06/05/24 1622     Accession Number Blood        Enterococcus faecalis by PCR Not detected     Enterococcus faecium by PCR Not detected     Listeria monocytogenes by PCR Not detected     STAPHYLOCOCCUS Detected        Staphylococcus Aureus Not detected     Staphylococcus epidermidis by PCR Detected        Staphylococcus lugdunensis by PCR Not detected     STREPTOCOCCUS Not detected     Streptococcus agalactiae (Group B) Not detected     Strep pneumoniae Not detected     Strep pyogenes,(Grp. A) Not detected     Acinetobacter calcoac baumannii complex by PCR Not detected     Bacteroides fragilis by PCR Not detected     Enterobacteriaceae by PCR Not detected     Enterobacter cloacae complex by PCR Not detected     Escherichia Coli Not detected     Klebsiella aerogenes by PCR Not detected     Klebsiella oxytoca by PCR Not detected     Klebsiella pneumoniae group by PCR Not detected     Proteus by PCR Not detected     Salmonella species by PCR Not detected     Serratia marcescens by PCR Not detected     Haemophilus Influenzae by PCR Not detected     Neisseria meningitidis by PCR Not detected     Pseudomonas aeruginosa Not detected     Stenotrophomonas maltophilia by PCR Not detected     Candida albicans by PCR Not detected     Candida auris by PCR Not detected     Candida glabrata Not detected     Candida krusei by PCR Not detected     Candida parapsilosis by PCR Not detected     Candida tropicalis by PCR Not detected     Cryptococcus neoformans/gattii by PCR Not detected     Resistant gene targets           Methicillin Resistance mecA/C  by PCR Detected        [employer] test comment False positive results may rarely occur. Correlate with     Comment: clinical,epidemiologic,  and other laboratory findings  Please see BCID Interpretation Guide in EPIC Links         MRSA by PCR [[id number]] Collected: 06/04/24 0215    Order Status: Completed Specimen: Swab Updated: 06/04/24 0443     MRSA by PCR Not Detected       Culture, C Auris Screen [[id number]] Collected: 06/04/24 0215    Order Status: Completed Specimen: Swab Updated: 06/07/24 1449     Special Requests No Special Requests        Culture No Candida auris present       Culture, Blood 1 [[id number]] Collected: 06/04/24 0124    Order Status: Completed Specimen: Blood Updated: 06/10/24 1431     Special Requests No Special Requests        Culture No growth 6 days       Culture, Urine [[id number]] Collected: 06/03/24 2204    Order Status: Completed Specimen: Urine Updated: 06/05/24 0811     Special Requests --        No Special Requests  Reflexed from [id number]       Culture No Growth (<1000 cfu/mL)  Imaging:  XR CHEST PORTABLE   Final Result      Persistent but improved diffuse bilateral pulmonary infiltrates.         Electronically signed by DARICE COLON      CTA HEAD NECK W CONTRAST   Final Result         1. No acute large vessel arterial occlusion. Mild to moderate atherosclerotic   changes.   2. Pulmonary hypertension.   3. Biapical airspace disease likely infectious/inflammatory.               Electronically signed by Aletha CHRISTELLA Ham      CT BRAIN PERFUSION   Final Result         1. No acute large vessel arterial occlusion. Mild to moderate atherosclerotic   changes.   2. Pulmonary hypertension.   3. Biapical airspace disease likely infectious/inflammatory.               Electronically signed by Aletha CHRISTELLA Ham      CT HEAD WO CONTRAST   Final Result   No acute intracranial findings.         Electronically signed by PAULINE BLANCH      XR CHEST PORTABLE   Final  Result   1. No change bilateral pneumonia         Electronically signed by Evalene JINNY Hoit      CT HEAD WO CONTRAST   Final Result      No acute intracranial abnormality.         Electronically signed by Toribio JINNY Musick      CTA CHEST W WO CONTRAST   Final Result   Extensive bilateral pulmonary infiltrates. No evidence of pulmonary embolism.         Electronically signed by DARICE COLON      XR CHEST PORTABLE   Final Result   1. Enlarged cardiac silhouette, worsening airspace disease or pulmonary edema.   No complicating features post intubation            Electronically signed by Susie Brain      XR CHEST PORTABLE   Final Result   Cardiomegaly and mild interstitial pulmonary edema.             Electronically signed by JIMMY HABIB          No results found.               "

## 2024-06-13 NOTE — Care Coordination (Signed)
"  NIV to be delivered today, trial overnight.    CM met with patient to discuss SNF again, patient stated she has been progressing quite a lot and is determined to return home, states she will be safe, agreeable to [facility], referrals sent, awaiting acceptance.    CM continues to follow and monitor for needs.   "

## 2024-06-13 NOTE — Progress Notes (Signed)
 "  IMPRESSION:   Acute on chronic hypoxic respiratory failure  Chronic hypercapnic respiratory failure  Bilateral pneumonia improved  Cirrhosis of liver  Chronic A-fib  Additional workup outlined below      RECOMMENDATIONS/PLAN:   63 year old obese lady came in because of shortness of breath and dyspnea she is chronically on home oxygen she did not have any sleep study done in the past she has history of hypertension chronic A-fib history of EtOH cirrhosis of liver, patient also has COPD she was severely hypoxic and she was put on noninvasive ventilator CAT scan of the chest was done which showed bilateral pneumonia patient was intubated and successfully weaned off and she is using BiPAP at night and oxygen during the daytime  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 CT head was negative  Continue use antibiotic for bilateral pneumonia  Nocturnal CPAP she never had any sleep study done  Patient has had worsening respiratory failure and is at risk for serious harm or death without appropriate therapy at home which include noninvasive ventilator while patient has an underlying condition of OSA it is not the primary indication for need for NIV patient has previously tried CPAP and BiPAP and has failed to improve his hypercapnia and saturation level.  Since chronic/acute respiratory failure can be prevented with the use of positive pressure ventilation and due to the nature of the patient deteriorating condition a portable volume ventilator has been ordered via mask interface for use at night as well as during the day.  The patient requires continuous access to noninvasive positive pressure ventilation therapy to reduce respiratory distress, minimize hospitalization and to control carbon dioxide retention.  Noninvasive ventilator setting BiPAP at an IPAP of 15-20 cm H2O and EPAP of 3-5 cm H2O, gradually increasing pressures to target an oxygen saturation (SpO2) of 88-92% and improved pH and PaCO2.  Higher IPAP levels (up to 25-30 cm H2O) and higher EPAP may be needed for severe hypercapnia in acute-on-chronic respiratory failure.    Continue with Bumex   Overnight pulse oximetry shows desaturation for 24 minutes qualify for home oxygen  Transfuse prn to maintain Hgb > 7  Labs to follow electrolytes, renal function and and blood counts  Bronchial hygiene with respiratory therapy techniques, bronchodilators  Pt needs IV fluids with additives and Drug therapy requiring intensive monitoring for toxicity  Prescription drug management with home med reconciliation reviewed  DVT, SUP prophylaxis     10/22  Alert awake eating breakfast on oxygen via nasal cannula getting nebulizer treatment use the noninvasive ventilator BiPAP her pCO2 was 60 discussed with her about getting noninvasive ventilator will order one but issue is the compliance, she had a history of polysubstance abuse and will do 6-minute walk test before discharge to see if she qualifies for POC.  10/23  Patient seen and examined at the bedside getting nebulizer treatment RT was present in the room denies any chest pain shortness of breath awaiting for noninvasive ventilator which has been ordered discussed with her about her condition and using inhalers and compliance with the noninvasive ventilator once patient get the noninvasive ventilator she can be discharged patient getting nebulizer treatment also on doxycycline .  Culture positive most likely contamination continue with the Bumex .  Recommend abstinence from substance abuse.  10/24   Patient was alert and awake at room air. She is tolerating a full diet. She denies any chest pain, shortness of breath. She is awaiting her noninvasive ventilator which has been ordered. Discussed  inhaler use and compliance with the ventilator upon discharge. Continue with the bumex . Recommend abstinence from substance abuse.    [x]  High complexity decision making was performed  [x]  See my orders for details    PMH:   has a past medical history of Anxiety, Depression, Gastrointestinal disorder, Heart failure (HCC), Hyperlipidemia, Hypertension, Liver disease, and Pneumonia.    PSH:   has a past surgical history that includes Cataract removal; egd transoral biopsy single/multiple (05/17/2009); Dilation and curettage of uterus (1999); Hysterectomy (2008); Cesarean section (1986 and 1993); Adenoidectomy (1972); Tonsillectomy (1972); back surgery; and Upper gastrointestinal endoscopy (N/A, 04/17/2024).     FHX: family history includes Cancer in her mother; Heart Disease in her father.     SHX:  reports that she quit smoking about 16 years ago. Her smoking use included cigarettes. She has never used smokeless tobacco. She reports that she does not drink alcohol and does not use drugs.    ALL:   Allergies   Allergen Reactions    Latex Rash        MEDS:   [x]  Reviewed - As Below   []  Not reviewed    Current Facility-Administered Medications   Medication Dose Route Frequency Provider Last Rate Last Admin    ipratropium 0.5 mg-albuterol  2.5 mg (DUONEB ) nebulizer solution 1 Dose  1 Dose Inhalation BID RT Aimee Elspeth SAUNDERS, MD   1 Dose at 06/13/24 0801    guaiFENesin  (MUCINEX ) extended release tablet 600 mg  600 mg Oral BID Gillard, Madison A, PA-C   600 mg at 06/12/24 2041    benzonatate  (TESSALON ) capsule 100 mg  100 mg Oral TID PRN Ronn Dixon A, PA-C   100 mg at 06/10/24 1217    doxycycline  (VIBRAMYCIN ) 100 mg in sodium chloride  0.9 % 100 mL IVPB (addEASE)  100 mg IntraVENous BID Mauricio Mancel LABOR, MD   Stopped at 06/12/24 2157    influenza tiss-cult vaccine (FLUCELVAX) injection 0.5 mL  1 Dose IntraMUSCular Prior to discharge Aimee Elspeth SAUNDERS, MD        bumetanide  (BUMEX ) injection 1 mg  1 mg IntraVENous Daily Azeem, Ahad, MD   1 mg at 06/10/24 0826    metoprolol  tartrate (LOPRESSOR ) tablet 50 mg  50 mg Oral BID Azeem, Ahad, MD   50 mg at 06/12/24 0918    OLANZapine  (ZYPREXA ) tablet 10 mg  10 mg Oral Nightly Punyala, Srinivasa R, MD    10 mg at 06/12/24 2041    atorvastatin  (LIPITOR ) tablet 40 mg  40 mg Oral Nightly Azeem, Ahad, MD   40 mg at 06/12/24 2041    haloperidol  lactate (HALDOL ) injection 5 mg  5 mg IntraVENous Q6H PRN Azeem, Ahad, MD   5 mg at 06/08/24 0438    budesonide  (PULMICORT ) nebulizer suspension 500 mcg  0.5 mg Nebulization BID RT Nunna, Krishidhar R, MD   500 mcg at 06/13/24 0801    insulin  glargine (LANTUS ) injection vial 10 Units  10 Units SubCUTAneous Daily Nunna, Krishidhar R, MD   10 Units at 06/12/24 0916    ipratropium 0.5 mg-albuterol  2.5 mg (DUONEB ) nebulizer solution 1 Dose  1 Dose Inhalation Q4H PRN Nunna, Krishidhar R, MD   1 Dose at 06/08/24 0304    insulin  lispro (HUMALOG ,ADMELOG ) injection vial 0-8 Units  0-8 Units SubCUTAneous Q4H Nunna, Krishidhar R, MD   2 Units at 06/07/24 2042    0.9 % sodium chloride  infusion   IntraVENous PRN Nunna, Krishidhar R, MD  albuterol  (PROVENTIL ) nebulizer solution 2.5 mg  2.5 mg Nebulization Q4H PRN Nunna, Krishidhar R, MD        [Held by provider] tiotropium bromide  (SPIRIVA  RESPIMAT) 2.5 MCG/ACT inhaler 2 puff  2 puff Inhalation Daily RT Nunna, Krishidhar R, MD        [Held by provider] budesonide -formoterol  (SYMBICORT ) 160-4.5 MCG/ACT inhaler 2 puff  2 puff Inhalation BID RT Nunna, Krishidhar R, MD        hydrALAZINE  (APRESOLINE ) injection 10 mg  10 mg IntraVENous Q4H PRN Sahib, Taaliba A, APRN - NP   10 mg at 06/07/24 0202    glucose chewable tablet 16 g  4 tablet Oral PRN Nunna, Krishidhar R, MD   16 g at 06/12/24 0245    dextrose  bolus 10% 125 mL  125 mL IntraVENous PRN Nunna, Krishidhar R, MD        Or    dextrose  bolus 10% 250 mL  250 mL IntraVENous PRN Nunna, Krishidhar R, MD        glucagon  injection 1 mg  1 mg SubCUTAneous PRN Nunna, Krishidhar R, MD        dextrose  10 % infusion   IntraVENous Continuous PRN Nunna, Krishidhar R, MD        sertraline  (ZOLOFT ) tablet 100 mg  100 mg Oral Daily Nunna, Krishidhar R, MD   100 mg at 06/12/24 9081    apixaban  (ELIQUIS )  tablet 5 mg  5 mg Per NG tube BID Nunna, Krishidhar R, MD   5 mg at 06/12/24 2041    sodium chloride  flush 0.9 % injection 5-40 mL  5-40 mL IntraVENous 2 times per day Marvis Lynwood HERO, APRN - CNP   10 mL at 06/12/24 2056    sodium chloride  flush 0.9 % injection 5-40 mL  5-40 mL IntraVENous PRN Marvis Lynwood HERO, APRN - CNP        0.9 % sodium chloride  infusion   IntraVENous PRN Marvis Lynwood HERO, APRN - CNP        lidocaine  1 % injection 50 mg  50 mg IntraDERmal Once Marvis Lynwood HERO, APRN - CNP        ALTEplase  (CATHFLO) injection 2 mg  2 mg IntraCATHeter PRN Marvis Lynwood HERO, APRN - CNP        sodium chloride  flush 0.9 % injection 5-40 mL  5-40 mL IntraVENous PRN Marvis Lynwood HERO, APRN - CNP        0.9 % sodium chloride  infusion   IntraVENous PRN Marvis Lynwood HERO, APRN - CNP   Stopped at 06/05/24 2344    potassium chloride  20 mEq/50 mL IVPB (Central Line)  20 mEq IntraVENous PRN Marvis Lynwood HERO, APRN - CNP        Or    potassium chloride  10 mEq/100 mL IVPB (Peripheral Line)  10 mEq IntraVENous PRN Marvis Lynwood HERO, APRN - CNP        magnesium  sulfate 2000 mg in 50 mL IVPB premix  2,000 mg IntraVENous PRN Marvis Lynwood HERO, APRN - CNP        ondansetron  (ZOFRAN -ODT) disintegrating tablet 4 mg  4 mg Oral Q8H PRN Marvis Lynwood HERO, APRN - CNP        Or    ondansetron  (ZOFRAN ) injection 4 mg  4 mg IntraVENous Q6H PRN Marvis Lynwood HERO, APRN - CNP   4 mg at 06/07/24 1021    polyethylene glycol (GLYCOLAX ) packet 17 g  17 g Oral Daily PRN Marvis Lynwood HERO, APRN -  CNP        acetaminophen  (TYLENOL ) tablet 650 mg  650 mg Oral Q6H PRN Marvis Lynwood HERO, APRN - CNP   650 mg at 06/12/24 2042    Or    acetaminophen  (TYLENOL ) suppository 650 mg  650 mg Rectal Q6H PRN Marvis Lynwood HERO, APRN - CNP            MAR reviewed and pertinent medications noted or modified as needed      MND:Ezmupwzwu items are noted in HPI.      Hemodynamics:    CO:    CI:    CVP:    SVR:   PAP Systolic:    PAP Diastolic:    PVR:    SV02:        Ventilator Settings:      Mode Rate TV Press PEEP FiO2  PIP Min. Vent   (S) CPAP/PS 16 bpm  380 mL    6 30 %  13 cmH2O           Vital Signs: Telemetry:    AFIB Intake/Output:   BP (!) 113/92 Comment: Nurse was notified  Pulse 63   Temp 98.4 F (36.9 C) (Oral)   Resp 18   Ht 1.575 m (5' 2)   Wt 112.5 kg (248 lb 0.3 oz)   SpO2 91%   BMI 45.35 kg/m     Temp (24hrs), Avg:98.2 F (36.8 C), Min:97.7 F (36.5 C), Max:98.8 F (37.1 C)        O2 Device: Nasal cannula O2 Flow Rate (L/min): 3 L/min       Wt Readings from Last 4 Encounters:   06/13/24 112.5 kg (248 lb 0.3 oz)   06/06/24 126.6 kg (279 lb 1.6 oz)   05/03/24 129.7 kg (286 lb)   05/05/24 129.7 kg (286 lb)          Intake/Output Summary (Last 24 hours) at 06/13/2024 0856  Last data filed at 06/13/2024 0457  Gross per 24 hour   Intake 332 ml   Output 200 ml   Net 132 ml       Last shift:      No intake/output data recorded.  Last 3 shifts: 10/22 1901 - 10/24 0700  In: 342 [P.O.:222; I.V.:20]  Out: 600 [Urine:600]       Physical Exam:     General: Caucasian female; alert awake on oxygen via nasal cannula CPAP at night  HEENT: NCAT, poor dentition, lips and mucosa dry  Eyes: anicteric; conjunctiva clear  Neck: no nodes, trach midline; no accessory MM use.  Chest: no deformity,  Cardiac: IR regular; no murmur;   Lungs: distant breath sounds; bilateral rales  Abd: soft, NT, hypoactive BS  Ext: Bilateral leg edema; no joint swelling; No clubbing  GU: NO foley, clear urine  Neuro: No focal deficit  Psych- no agitation, oriented to person;   Skin: warm, dry, no cyanosis;   Pulses: 1-2+ Bilateral pedal, radial  Capillary: brisk; pale      DATA:  No results found for this or any previous visit.    05/02/24    ECHO (TTE) COMPLETE (PRN CONTRAST/BUBBLE/STRAIN/3D) 05/06/2024 10:52 AM (Final)    Interpretation Summary    Left Ventricle: Normal left ventricular systolic function with a visually estimated EF of 70 - 75%. Left ventricle size is normal. Normal wall thickness. Normal wall motion. Normal diastolic function.     Right Ventricle: Right ventricle is moderately dilated. Normal systolic function.  Right Atrium: Right atrium is mildly dilated.    Pericardium: Moderate (1-2 cm) localized pericardial effusion present around the left ventricle. Pericardial posterior effusion measures 1.0 cm.    Image quality is adequate. Contrast used: Lumason . Technically difficult study, technically difficult study with poor endocardial visualization, technically difficult study due to patient's body habitus and technically difficult study due to patient's heart rhythm.    Signed by: Laquetta Commander, MD on 05/06/2024 10:52 AM       MAR reviewed and pertinent medications noted or modified as needed    MEDS:   Current Facility-Administered Medications   Medication Dose Route Frequency Provider Last Rate Last Admin    ipratropium 0.5 mg-albuterol  2.5 mg (DUONEB ) nebulizer solution 1 Dose  1 Dose Inhalation BID RT Aimee Elspeth SAUNDERS, MD   1 Dose at 06/13/24 0801    guaiFENesin  (MUCINEX ) extended release tablet 600 mg  600 mg Oral BID Ronn Dixon A, PA-C   600 mg at 06/12/24 2041    benzonatate  (TESSALON ) capsule 100 mg  100 mg Oral TID PRN Ronn Dixon A, PA-C   100 mg at 06/10/24 1217    doxycycline  (VIBRAMYCIN ) 100 mg in sodium chloride  0.9 % 100 mL IVPB (addEASE)  100 mg IntraVENous BID Mauricio Mancel LABOR, MD   Stopped at 06/12/24 2157    influenza tiss-cult vaccine (FLUCELVAX) injection 0.5 mL  1 Dose IntraMUSCular Prior to discharge Aimee Elspeth SAUNDERS, MD        bumetanide  (BUMEX ) injection 1 mg  1 mg IntraVENous Daily Azeem, Ahad, MD   1 mg at 06/10/24 0826    metoprolol  tartrate (LOPRESSOR ) tablet 50 mg  50 mg Oral BID Azeem, Ahad, MD   50 mg at 06/12/24 9081    OLANZapine  (ZYPREXA ) tablet 10 mg  10 mg Oral Nightly Punyala, Srinivasa R, MD   10 mg at 06/12/24 2041    atorvastatin  (LIPITOR ) tablet 40 mg  40 mg Oral Nightly Azeem, Ahad, MD   40 mg at 06/12/24 2041    haloperidol  lactate (HALDOL ) injection 5 mg  5 mg IntraVENous Q6H PRN Azeem,  Ahad, MD   5 mg at 06/08/24 0438    budesonide  (PULMICORT ) nebulizer suspension 500 mcg  0.5 mg Nebulization BID RT Nunna, Krishidhar R, MD   500 mcg at 06/13/24 0801    insulin  glargine (LANTUS ) injection vial 10 Units  10 Units SubCUTAneous Daily Nunna, Krishidhar R, MD   10 Units at 06/12/24 0916    ipratropium 0.5 mg-albuterol  2.5 mg (DUONEB ) nebulizer solution 1 Dose  1 Dose Inhalation Q4H PRN Nunna, Krishidhar R, MD   1 Dose at 06/08/24 0304    insulin  lispro (HUMALOG ,ADMELOG ) injection vial 0-8 Units  0-8 Units SubCUTAneous Q4H Nunna, Krishidhar R, MD   2 Units at 06/07/24 2042    0.9 % sodium chloride  infusion   IntraVENous PRN Nunna, Krishidhar R, MD        albuterol  (PROVENTIL ) nebulizer solution 2.5 mg  2.5 mg Nebulization Q4H PRN Nunna, Krishidhar R, MD        [Held by provider] tiotropium bromide  (SPIRIVA  RESPIMAT) 2.5 MCG/ACT inhaler 2 puff  2 puff Inhalation Daily RT Nunna, Krishidhar R, MD        [Held by provider] budesonide -formoterol  (SYMBICORT ) 160-4.5 MCG/ACT inhaler 2 puff  2 puff Inhalation BID RT Nunna, Krishidhar R, MD        hydrALAZINE  (APRESOLINE ) injection 10 mg  10 mg IntraVENous Q4H PRN Sahib, Rocky LABOR, APRN - NP   10  mg at 06/07/24 0202    glucose chewable tablet 16 g  4 tablet Oral PRN Rawland Anton R, MD   16 g at 06/12/24 0245    dextrose  bolus 10% 125 mL  125 mL IntraVENous PRN Nunna, Krishidhar R, MD        Or    dextrose  bolus 10% 250 mL  250 mL IntraVENous PRN Nunna, Krishidhar R, MD        glucagon  injection 1 mg  1 mg SubCUTAneous PRN Nunna, Krishidhar R, MD        dextrose  10 % infusion   IntraVENous Continuous PRN Nunna, Krishidhar R, MD        sertraline  (ZOLOFT ) tablet 100 mg  100 mg Oral Daily Nunna, Krishidhar R, MD   100 mg at 06/12/24 9081    apixaban  (ELIQUIS ) tablet 5 mg  5 mg Per NG tube BID Nunna, Krishidhar R, MD   5 mg at 06/12/24 2041    sodium chloride  flush 0.9 % injection 5-40 mL  5-40 mL IntraVENous 2 times per day Marvis Lynwood HERO, APRN - CNP   10 mL at  06/12/24 2056    sodium chloride  flush 0.9 % injection 5-40 mL  5-40 mL IntraVENous PRN Marvis Lynwood HERO, APRN - CNP        0.9 % sodium chloride  infusion   IntraVENous PRN Marvis Lynwood HERO, APRN - CNP        lidocaine  1 % injection 50 mg  50 mg IntraDERmal Once Marvis Lynwood HERO, APRN - CNP        ALTEplase  (CATHFLO) injection 2 mg  2 mg IntraCATHeter PRN Marvis Lynwood HERO, APRN - CNP        sodium chloride  flush 0.9 % injection 5-40 mL  5-40 mL IntraVENous PRN Marvis Lynwood HERO, APRN - CNP        0.9 % sodium chloride  infusion   IntraVENous PRN Marvis Lynwood HERO, APRN - CNP   Stopped at 06/05/24 2344    potassium chloride  20 mEq/50 mL IVPB (Central Line)  20 mEq IntraVENous PRN Marvis Lynwood HERO, APRN - CNP        Or    potassium chloride  10 mEq/100 mL IVPB (Peripheral Line)  10 mEq IntraVENous PRN Marvis Lynwood HERO, APRN - CNP        magnesium  sulfate 2000 mg in 50 mL IVPB premix  2,000 mg IntraVENous PRN Marvis Lynwood HERO, APRN - CNP        ondansetron  (ZOFRAN -ODT) disintegrating tablet 4 mg  4 mg Oral Q8H PRN Marvis Lynwood HERO, APRN - CNP        Or    ondansetron  (ZOFRAN ) injection 4 mg  4 mg IntraVENous Q6H PRN Marvis Lynwood HERO, APRN - CNP   4 mg at 06/07/24 1021    polyethylene glycol (GLYCOLAX ) packet 17 g  17 g Oral Daily PRN Marvis Lynwood HERO, APRN - CNP        acetaminophen  (TYLENOL ) tablet 650 mg  650 mg Oral Q6H PRN Marvis Lynwood HERO, APRN - CNP   650 mg at 06/12/24 2042    Or    acetaminophen  (TYLENOL ) suppository 650 mg  650 mg Rectal Q6H PRN Marvis Lynwood HERO, APRN - CNP            ARTERIAL BLOOD GAS  Recent Labs     06/11/24  0411   PHART 7.40   PCO2ART 57*   PO2ART 78*   HCO3ART 34*   BEART  8.0*   FIO2A 32   O2SATART 95   OXYHEM 93.4*   CARBOXHGBART 0.9*   METHGBART 0.4   TEMP 98.4     Labs:    Recent Labs     06/11/24  0901 06/12/24  0433   WBC 8.4 6.8   HGB 11.2* 11.8   PLT 146* 103*     Recent Labs     06/11/24  0901 06/12/24  0433   NA 142 140   K 3.6 3.7   CL 99 103   CO2 36* 26   GLUCOSE 114* 102*   BUN 20 17   CREATININE 0.68 0.69   CALCIUM   8.9 8.5*     No results for input(s): CKTOTAL, CKMB, CKMBINDEX, TROPONINI in the last 72 hours.  Lab Results   Component Value Date/Time    PROBNP 11,914 06/03/2024 05:54 PM      Lab Results   Component Value Date/Time    TSH 2.400 06/09/2024 01:50 PM      Results       Procedure Component Value Units Date/Time    Culture, Blood 1 [[id number]] Collected: 06/09/24 1420    Order Status: Sent Specimen: Blood Updated: 06/09/24 1451    Legionella antigen, urine [[id number]] Collected: 06/04/24 1228    Order Status: Completed Specimen: Urine Updated: 06/06/24 1636     Sample Site Urine        L pneumophila S1 Ag, Ur Negative        Comment: Presumptive negative for L. pneumophila serogroup 1 antigen in urine,  suggesting no recent or current infection. Legionnaires' disease  cannot be ruled out since other serogroups and species may also  cause disease.  Performed At: [facility]  [address] [place], [place] [id number]  Jennette Shorter MD Ey:[phone]         S. pneumonia Antigen, Urine/CSF [[id number]] Collected: 06/04/24 1228    Order Status: Completed Specimen: Urine Updated: 06/06/24 1636     Sample Site Urine        Specimen Urine     S pneumoniae Ag, CSF Negative        FLUID CULTURE Not indicated.     Organism Id Not indicated.     Please note: Comment        Comment: College [employer] standards require a culture to be  performed on CSF specimens submitted for bacterial antigen testing.  (CAP [id number]) Urine specimens will not be cultured.  Performed At: [facility]  [address] [place], [place] [id number]  Jennette Shorter MD Ey:[phone]         COVID-19 & Influenza Combo [[id number]] Collected: 06/04/24 0845    Order Status: Completed Specimen: Nasopharynx Updated: 06/04/24 0934     SARS-CoV-2, PCR Not Detected        Comment: Not Detected results do not preclude SARS-CoV-2 infection and should not be used as the sole basis for patient management decisions. Results  must be combined with clinical observations, patient history, and epidemiological information        Rapid Influenza A By PCR Not Detected        Rapid Influenza B By PCR Not Detected        Comment:    Testing was performed using cobas Liat SARS-CoV-2 and Influenza A/B nucleic acid assay.  This test is a multiplex Real-Time Reverse Transcriptas         Culture, Blood 2 [[id number]]  (Abnormal) Collected: 06/04/24 0300  Order Status: Completed Specimen: Blood Updated: 06/06/24 0836     Special Requests No Special Requests        Culture       Staphylococcus species, coagulase negative growing in 1 of 2 bottles drawn No Site Indicated                  This isolate represents a common contaminant and may or may not be clinically significant. Please notify the laboratory if further work is indicated.            (NOTE) CALLED TO ;CAMBPELL IRBY RN@[email or web address] .AM    Culture, Blood, PCR ID Panel [[id number]]  (Abnormal) Collected: 06/04/24 0300    Order Status: Completed Specimen: Blood Updated: 06/05/24 1622     Accession Number Blood        Enterococcus faecalis by PCR Not detected     Enterococcus faecium by PCR Not detected     Listeria monocytogenes by PCR Not detected     STAPHYLOCOCCUS Detected        Staphylococcus Aureus Not detected     Staphylococcus epidermidis by PCR Detected        Staphylococcus lugdunensis by PCR Not detected     STREPTOCOCCUS Not detected     Streptococcus agalactiae (Group B) Not detected     Strep pneumoniae Not detected     Strep pyogenes,(Grp. A) Not detected     Acinetobacter calcoac baumannii complex by PCR Not detected     Bacteroides fragilis by PCR Not detected     Enterobacteriaceae by PCR Not detected     Enterobacter cloacae complex by PCR Not detected     Escherichia Coli Not detected     Klebsiella aerogenes by PCR Not detected     Klebsiella oxytoca by PCR Not detected     Klebsiella pneumoniae group by PCR Not detected     Proteus by PCR Not detected     Salmonella species by  PCR Not detected     Serratia marcescens by PCR Not detected     Haemophilus Influenzae by PCR Not detected     Neisseria meningitidis by PCR Not detected     Pseudomonas aeruginosa Not detected     Stenotrophomonas maltophilia by PCR Not detected     Candida albicans by PCR Not detected     Candida auris by PCR Not detected     Candida glabrata Not detected     Candida krusei by PCR Not detected     Candida parapsilosis by PCR Not detected     Candida tropicalis by PCR Not detected     Cryptococcus neoformans/gattii by PCR Not detected     Resistant gene targets          Methicillin Resistance mecA/C  by PCR Detected        [employer] test comment False positive results may rarely occur. Correlate with     Comment: clinical,epidemiologic,  and other laboratory findings  Please see BCID Interpretation Guide in EPIC Links         MRSA by PCR [[id number]] Collected: 06/04/24 0215    Order Status: Completed Specimen: Swab Updated: 06/04/24 0443     MRSA by PCR Not Detected       Culture, C Auris Screen [[id number]] Collected: 06/04/24 0215    Order Status: Completed Specimen: Swab Updated: 06/07/24 1449     Special Requests No Special Requests        Culture No Candida auris present  Culture, Blood 1 [[id number]] Collected: 06/04/24 0124    Order Status: Completed Specimen: Blood Updated: 06/10/24 1431     Special Requests No Special Requests        Culture No growth 6 days       Culture, Urine [[id number]] Collected: 06/03/24 2204    Order Status: Completed Specimen: Urine Updated: 06/05/24 0811     Special Requests --        No Special Requests  Reflexed from [id number]       Culture No Growth (<1000 cfu/mL)              Imaging:  XR CHEST PORTABLE   Final Result      Persistent but improved diffuse bilateral pulmonary infiltrates.         Electronically signed by DARICE COLON      CTA HEAD NECK W CONTRAST   Final Result         1. No acute large vessel arterial occlusion. Mild to moderate atherosclerotic   changes.    2. Pulmonary hypertension.   3. Biapical airspace disease likely infectious/inflammatory.               Electronically signed by Aletha CHRISTELLA Ham      CT BRAIN PERFUSION   Final Result         1. No acute large vessel arterial occlusion. Mild to moderate atherosclerotic   changes.   2. Pulmonary hypertension.   3. Biapical airspace disease likely infectious/inflammatory.               Electronically signed by Aletha CHRISTELLA Ham      CT HEAD WO CONTRAST   Final Result   No acute intracranial findings.         Electronically signed by PAULINE BLANCH      XR CHEST PORTABLE   Final Result   1. No change bilateral pneumonia         Electronically signed by Evalene JINNY Hoit      CT HEAD WO CONTRAST   Final Result      No acute intracranial abnormality.         Electronically signed by Toribio JINNY Musick      CTA CHEST W WO CONTRAST   Final Result   Extensive bilateral pulmonary infiltrates. No evidence of pulmonary embolism.         Electronically signed by DARICE COLON      XR CHEST PORTABLE   Final Result   1. Enlarged cardiac silhouette, worsening airspace disease or pulmonary edema.   No complicating features post intubation            Electronically signed by Susie Brain      XR CHEST PORTABLE   Final Result   Cardiomegaly and mild interstitial pulmonary edema.             Electronically signed by JIMMY HABIB          No results found.               "

## 2024-06-13 NOTE — Discharge Summary (Signed)
 "                                       Discharge Summary    Name: Breanna Lester  [id number]  Date of Birth: [birth year] (Age: 63 y.o.)   Date of Admission: 06/03/2024  Date of Discharge: 06/13/2024  Attending Physician: Breanna Elspeth SAUNDERS, MD    Discharge Diagnosis:   Principal Problem:    Acute respiratory failure [facility])  Resolved Problems:    * No resolved hospital problems. *  Acute hypoxic respiratory failure, resolved  Hypercapnic respiratory failure  Severe bilateral CAP  COPD  Concern for bacteremia, ruled out  Acute metabolic encephalopathy, resolved  Polysubstance abuse  A-fib  Microcytic anemia  Thrombocytopenia, mild  OSA    Consultations:  IP CONSULT TO PHARMACY  IP CONSULT TO VASCULAR ACCESS TEAM  IP CONSULT TO CASE MANAGEMENT  IP CONSULT TO TELE-NEUROLOGY  IP CONSULT TO VASCULAR ACCESS TEAM  IP CONSULT TO PSYCHIATRY  IP CONSULT TO DIETITIAN  IP CONSULT TO PULMONOLOGY  IP CONSULT TO PULMONOLOGY  IP CONSULT TO CASE MANAGEMENT  IP CONSULT TO CASE MANAGEMENT      Brief Admission History/Reason for Admission Per Breanna DELENA Fairly, MD:   Acute hypoxic respiratory failure    Brief Hospital Course by Main Problems:   Breanna Lester is a 63 y.o.  female with PMHx significant for chronic hypoxic respiratory failure on 3 L nasal cannula secondary to COPD, anxiety, depression, HTN, A-fib on Eliquis , T2DM, pancreatitis and EtOH cirrhosis who presented to ED on 06/03/2024 for worsening dyspnea.  In ED, patient severely hypoxic with increased work of breathing which was refractory to BiPAP, requiring intubation.  Admitted to ICU.  CTA chest showed extensive bilateral pulmonary infiltrates concerning for severe bilateral pneumonia, however negative for PE.  Started on empiric IV ceftriaxone  and azithromycin .  Initial blood culture showing Staph epidermidis, likely contaminant and repeat blood culture with no growth (confirmed with the lab on day of discharge).  Respiratory status improved and patient was able to be extubated to  BiPAP and further decreased to home dose 3 L NC.  However, patient remained confused so psychiatry was consulted.  Started on Zoloft  and Zyprexa .  Stroke alert was called on 10/17 for left-sided weakness with complete left hemianopia.  CT head, CTA head/neck and CT brain perfusion with no acute findings.  Patient unable to tolerate MRI.  Neurology evaluated, consider EEG if clinical status worsening otherwise no further stroke work up.  Transferred to medical service on 10/20.  Pt with increased work of breathing and lethargy on 10/21 with repeat ABG showing worsening hypercapnia of 60.  Started on nightly BiPAP with significant improvement in breathing and normalization of mental status.  Now stable on home dose 3 L nasal cannula.  Pulmonology consulted, recommends Trilogy NIV at discharge.  PT/OT recommending SNF.  Case management assisting with insurance approval of NIV and placement.    Pulmonology has cleared patient for discharge.  Recommend patient get repeat chest CT or CXR in 4 to 6 weeks to ensure resolution of pneumonia. Advised patient to follow-up with PCP in 10 to 14 days with recent admission for continued monitoring and management of medications and other comorbidities.  Discussed discharge plan with patient, which patient agreed to.  All questions answered and patient will be discharged pending delivery of NIV and disposition to SNF versus HH.  Discharge Exam:  Patient seen and examined by me on discharge day.  No new complaints.  Stable on home dose 3 L O2.  Receiving breathing treatment. Denies chest pain or shortness of breath still with mild cough. Reported a little dizziness with getting up yesterday which quickly resolved.    Pertinent Findings:  Patient Vitals for the past 24 hrs:   BP Temp Temp src Pulse Resp SpO2 Weight   06/13/24 0825 (!) 113/92 -- -- 63 -- -- --   06/13/24 0803 -- -- -- -- -- 91 % --   06/13/24 0600 -- -- -- -- -- -- 112.5 kg (248 lb 0.3 oz)   06/13/24 0347 105/62 97.7  F (36.5 C) Oral 53 18 100 % --   06/13/24 0234 (!) 119/51 -- -- 54 -- 95 % --   06/12/24 2027 (!) 97/58 98.4 F (36.9 C) Oral 60 18 -- --   06/12/24 1951 -- -- -- 64 18 96 % --   06/12/24 1948 135/69 97.9 F (36.6 C) Oral 55 18 99 % --   06/12/24 1946 -- -- -- 64 18 96 % --   06/12/24 1527 (!) 123/53 98.8 F (37.1 C) Oral 98 20 90 % --   06/12/24 0837 138/66 98.4 F (36.9 C) Oral 65 18 99 % --       Gen:    Not in distress  Chest: Very mild expiratory wheezing, stable on home dose 3 L NC  CVS:   Regular rhythm.  No edema  Abd:  Soft, not distended, not tender  Neuro: At baseline, alert and oriented x 3    Discharge/Recent Laboratory Results:  Recent Labs     06/12/24  0433   NA 140   K 3.7   CL 103   CO2 26   BUN 17   CREATININE 0.69   GLUCOSE 102*   CALCIUM  8.5*     Recent Labs     06/12/24  0433   HGB 11.8   HCT 41.7   WBC 6.8   PLT 103*       Discharge Medications:     Medication List        START taking these medications      apixaban  5 MG Tabs tablet  Commonly known as: ELIQUIS   Take 1 tablet by mouth 2 times daily     atorvastatin  40 MG tablet  Commonly known as: LIPITOR   Take 1 tablet by mouth nightly     bumetanide  1 MG tablet  Commonly known as: BUMEX   Take 1 tablet by mouth daily     levoFLOXacin  750 MG tablet  Commonly known as: LEVAQUIN   Take 1 tablet by mouth daily for 5 days     metFORMIN  500 MG extended release tablet  Commonly known as: GLUCOPHAGE -XR     OLANZapine  10 MG tablet  Commonly known as: ZYPREXA   Take 1 tablet by mouth nightly            CHANGE how you take these medications      albuterol  sulfate HFA 108 (90 Base) MCG/ACT inhaler  Commonly known as: Ventolin  HFA  Inhale 2 puffs into the lungs 4 times daily as needed for Wheezing  What changed: Another medication with the same name was removed. Continue taking this medication, and follow the directions you see here.     metoprolol  tartrate 50 MG tablet  Commonly known as: LOPRESSOR   Take 1 tablet by mouth 2 times daily  What  changed:    medication strength  how much to take            CONTINUE taking these medications      acetaminophen  325 MG tablet  Commonly known as: TYLENOL   Take 1 tablet by mouth every 6 hours as needed for Pain     Blood Glucose Monitor System w/Device Kit  Pharmacist to identify preferred meter and strips.     blood glucose test strips  Test 1-2 times a day & as needed for symptoms of irregular blood glucose. Dispense sufficient amount for indicated testing frequency plus additional to accommodate PRN testing needs. Pharmacist to identify preferred brand.     chlorthalidone  25 MG tablet  Commonly known as: HYGROTON      guaiFENesin  600 MG extended release tablet  Commonly known as: MUCINEX   Take 1 tablet by mouth 2 times daily     lactobacillus capsule  Take 1 capsule by mouth daily (with breakfast)     Lancets 30G Misc  Test 1-2 times a day & as needed for symptoms of irregular blood glucose. Dispense sufficient amount for indicated testing frequency plus additional to accommodate PRN testing needs. Pharmacist to identify preferred brand.     Melatonin 10 MG Tabs     pantoprazole  40 MG tablet  Commonly known as: PROTONIX   Take 1 tablet by mouth 2 times daily (before meals)     sertraline  100 MG tablet  Commonly known as: ZOLOFT   Take 1 tablet by mouth daily     Trelegy Ellipta  100-62.5-25 MCG/ACT Aepb inhaler  Generic drug: fluticasone -umeclidin-vilant  Inhale 1 puff into the lungs daily            STOP taking these medications      escitalopram  5 MG tablet  Commonly known as: LEXAPRO             ASK your doctor about these medications      ondansetron  4 MG disintegrating tablet  Commonly known as: ZOFRAN -ODT  Take 1 tablet by mouth every 8 hours as needed for Nausea or Vomiting               Where to Get Your Medications        These medications were sent to [facility] - [place], [place] - [address] - P [phone] [facility] [phone]  [address] [place], [place] [address]      Phone: [phone]    atorvastatin  40 MG tablet  bumetanide  1 MG tablet  levoFLOXacin  750 MG tablet  metoprolol  tartrate 50 MG tablet  OLANZapine  10 MG tablet  Trelegy Ellipta  100-62.5-25 MCG/ACT Aepb inhaler             DISPOSITION:    Home with Family:        Home with HH/PT/OT/RN:    SNF/LTC:    SA[facility]:    OTHER: SNF vs HH           Code status:   Recommended diet: diabetic diet  Recommended activity: activity as tolerated  Wound care: None      Follow up with:   PCP : Unknown, Provider  Rubin Sprague, MD  [address]  [place] [address]  [phone]    Schedule an appointment as soon as possible for a visit in 1 week(s)  Follow-up with PCP in 10 to 14 days given recent hospitalization for continued monitoring and management of medications and other comorbidities.  Obtain repeat chest x-ray in 4  to 6 weeks to ensure resolution of pneumonia.    Mauricio Mancel LABOR, MD  [address] [place] [address]  [phone]    Schedule an appointment as soon as possible for a visit in 1 week(s)  Follow-up with pulmonologist in 1 to 2 weeks for continued monitoring and management of COPD requiring O2 and BiPAP    Punyala, Srinivasa R, MD  [address]  [place] [address]  [phone]    Schedule an appointment as soon as possible for a visit in 1 week(s)  Follow-up with psychiatry in 1 to 2 weeks for continued medication management          Total time in minutes spent coordinating this discharge (includes going over instructions, follow-up, prescriptions, and preparing report for sign off to her PCP) :  35 minutes   "

## 2024-06-13 NOTE — Plan of Care (Signed)
 Patient continues to work toward goals.

## 2024-06-13 NOTE — Plan of Care (Signed)
 "         PHYSICAL THERAPY TREATMENT     Patient: Breanna Lester (63 y.o. female)  Date: 06/13/2024  Diagnosis: Acute respiratory failure, unspecified whether with hypoxia or hypercapnia (HCC) [J96.00]  Acute on chronic congestive heart failure, unspecified heart failure type (HCC) [I50.9]  Acute hypoxic respiratory failure (HCC) [J96.01] Acute respiratory failure (HCC)      Precautions: General Precautions, Fall Risk, Bed Alarm                      Recommendations for nursing mobility: Out of bed to chair for meals, Encourage HEP in prep for ADLs/mobility; see handout for details, AD and gt belt for bed to chair , Amb to bathroom with AD and gait belt, and Assist x1    In place during session: Peripheral IV, Nasal Cannula 3L, and EKG/telemetry   Chart, physical therapy assessment, plan of care and goals were reviewed.  ASSESSMENT  Patient continues with skilled PT services and is progressing towards goals. Pt sitting on EOB upon PT arrival, agreeable to session. Pt A&O x 4. (See below for objective details and assist levels).     Pt demos good progress today requiring less A with transfers today (CG-min A) compared to previous session. Pt able to stand without using a rocking motion or a count of 3 to stand.  Pt able to progress gait distance today to 65 ft today with RW and CGA.  Pt amb with slow cadence, decreased step length, and wide BOS requiring cues to increase step length.  Pt stated she would like to ambulate without oxygen as she does not use it at home other than at night.  Pt amb on RA for the 65 ft with a rest break around 30 ft for 1 minute and O2 sats  at 92% with PLB technique. Pt amb with improved safety using RW vs rollator.  Pt placed back on oxygen once returning to room. Pt performed LE exercises with minimal cueing and was given a written copy of HEP. Pt left sitting at EOB with her daughter present in the room and all needs met.     Overall pt tolerated session well today with 8/10 pain  reported in back.  Will continue to benefit from skilled PT services, and will continue to progress as tolerated. Current PT DC recommendation Moderate intensity short-term skilled physical therapy up to 5x/week once medically appropriate.     Start of Session End of Session   SPO2 (%) 98 96   Heart Rate (BPM) 64 66     GOALS:    Problem: Physical Therapy - Adult  Goal: By Discharge: Performs mobility at highest level of function for planned discharge setting.  See evaluation for individualized goals.  Description: FUNCTIONAL STATUS PRIOR TO ADMISSION: Pt required assistance with ADLs and IADLs    HOME SUPPORT: The patient lived with daughter to provide assistance but is not available to 24/7 for assistance    Physical Therapy Goals  Initiated 06/09/2024  Pt stated goal: to go home  Pt will be I with LE HEP in 7 days.  Pt will perform bed mobility with CGA in 7 days.  Pt will perform transfers with CGA in 7 days.   Pt will amb 200 feet with LRAD safely with Contact Guard Assist in 7 days.  Pt will demonstrate improvement in standing balance from Moderate Assist x2 to CGA in 7 days.      Outcome:  Progressing          PLAN :  Patient continues to benefit from skilled intervention to address functional impairments. Continue treatment per established plan of care to address goals.    Recommendation for discharge: (in order for the patient to meet his/her long term goals)  Moderate intensity short-term skilled physical therapy up to 5x/week    Potential barriers for safe discharge: concern for pt safely navigating or managing the home environment.    IF patient discharges home will need the following IFZ:wnwz     SUBJECTIVE:   Patient stated I'm feeling really good.  I'm ready to go home.    OBJECTIVE DATA SUMMARY:   Critical Behavior:  Orientation  Overall Orientation Status: Within Functional Limits  Orientation Level: Oriented X4  Cognition  Overall Cognitive Status: Exceptions  Arousal/Alertness: Appears  intact  Following Commands: Appears intact  Attention Span: Appears intact  Memory: Appears intact  Safety Judgement: Decreased awareness of need for safety  Insights: Fully aware of deficits  Initiation: Requires cues for some  Sequencing: Requires cues for some    Functional Mobility Training:  Bed Mobility:  Bed Mobility Training  Bed Mobility Training: No  Transfers:  Transfer Training  Transfer Training: Yes  Interventions: Manual cues;Safety awareness training;Verbal cues;Tactile cues  Sit to Stand: Contact guard assistance;Minimal assistance  Stand to Sit: Contact guard assistance  Balance:  Balance  Sitting: Intact  Standing: Impaired  Standing - Static: Good;Constant support  Standing - Dynamic: Constant support;Fair  Wheelchair Mobility:     Ambulation/Gait Training:                                Soil Scientist: Yes  Overall Level of Assistance: Contact guard assistance  Distance (ft): 65 Feet  Assistive Device: Gait belt;Walker, rolling  Interventions: Manual cues;Safety awareness training;Verbal cues  Base of Support: Widened  Speed/Cadence: Slow  Step Length: Right shortened;Left shortened  Gait Abnormalities: Decreased step clearance                     Therapeutic Intervention provided:   EOB transfers, OOB transfers, amb with AD, and LE therapeutic exercises      EXERCISE   Sets   Reps   Active Active Assist   Passive Self ROM   Comments   Ankle Pumps  10 [x]  []  []  []     Quad Sets/Glut Sets   []  []  []  []     Hamstring Sets   []  []  []  []     Short Arc Quads   []  []  []  []     Heel Slides   []  []  []  []     Straight Leg Raises   []  []  []  []     Hip abd/add  10 [x]  []  []  []     Long Arc Quads  10 [x]  []  []  []     Marching  10 [x]  []  []  []     Seated HR/TR   []  []  []  []        []  []  []  []        Pain Rating:  8/10 back reported  Pain Intervention(s):   pain is at a level acceptable to the patient    Activity Tolerance:   Fair  and requires rest breaks    After treatment patient left in no apparent distress:    Bed locked and returned to lowest position, Patient left in no apparent distress in  bed, Call bell within reach, and Caregiver / family present, and nsg updated     COMMUNICATION/COLLABORATION:   The patients plan of care was discussed with: Registered nurse    Patient Education  Education Given To: Patient  Education Provided: Role of Therapy;Plan of Care;Transfer Training;Energy Conservation;Equipment;Mobility Training;Home Exercise Program  Education Provided Comments: therex, HEP, safety with transfers, ambulation  Education Method: Demonstration;Verbal  Barriers to Learning: None  Education Outcome: Verbalized understanding;Demonstrated understanding;Continued education needed        Jon Balloon, PT  Minutes: 25           "

## 2024-06-13 NOTE — Plan of Care (Signed)
"    Problem: Discharge Planning  Goal: Discharge to home or other facility with appropriate resources  Outcome: Progressing     Problem: Skin/Tissue Integrity - Adult  Goal: Skin integrity remains intact  Description: 1.  Monitor for areas of redness and/or skin breakdown  2.  Assess vascular access sites hourly  3.  Every 4-6 hours minimum:  Change oxygen saturation probe site  4.  Every 4-6 hours:  If on nasal continuous positive airway pressure, respiratory therapy assess nares and determine need for appliance change or resting period  Outcome: Progressing     "

## 2024-06-14 LAB — CBC WITH AUTO DIFFERENTIAL
Basophils %: 0.8 % (ref 0.0–1.0)
Basophils Absolute: 0.05 K/UL (ref 0.00–0.10)
Eosinophils %: 1 % (ref 0.0–7.0)
Eosinophils Absolute: 0.06 K/UL (ref 0.00–0.40)
Hematocrit: 34.6 % — ABNORMAL LOW (ref 35.0–47.0)
Hemoglobin: 10.1 g/dL — ABNORMAL LOW (ref 11.5–16.0)
Immature Granulocytes %: 0.3 % (ref 0–0.5)
Immature Granulocytes Absolute: 0.02 K/UL (ref 0.00–0.04)
Lymphocytes %: 27.5 % (ref 12.0–49.0)
Lymphocytes Absolute: 1.73 K/UL (ref 0.80–3.50)
MCH: 24.9 pg — ABNORMAL LOW (ref 26.0–34.0)
MCHC: 29.2 g/dL — ABNORMAL LOW (ref 30.0–36.5)
MCV: 85.4 FL (ref 80.0–99.0)
MPV: 13.1 FL — ABNORMAL HIGH (ref 8.9–12.9)
Monocytes %: 8.7 % (ref 5.0–13.0)
Monocytes Absolute: 0.55 K/UL (ref 0.00–1.00)
Neutrophils %: 61.7 % (ref 32.0–75.0)
Neutrophils Absolute: 3.89 K/UL (ref 1.80–8.00)
Nucleated RBCs: 0 /100{WBCs}
Platelets: 97 K/uL — ABNORMAL LOW (ref 150–400)
RBC: 4.05 M/uL (ref 3.80–5.20)
RDW: 17 % — ABNORMAL HIGH (ref 11.5–14.5)
WBC: 6.3 K/uL (ref 3.6–11.0)
nRBC: 0 K/uL (ref 0.00–0.01)

## 2024-06-14 LAB — BASIC METABOLIC PANEL
Anion Gap: 7 mmol/L (ref 2–14)
BUN/Creatinine Ratio: 20 (ref 12–20)
BUN: 13 mg/dL (ref 8–23)
CO2: 34 mmol/L — ABNORMAL HIGH (ref 20–29)
Calcium: 8.9 mg/dL (ref 8.8–10.2)
Chloride: 102 mmol/L (ref 98–107)
Creatinine: 0.66 mg/dL (ref 0.60–1.00)
Est, Glom Filt Rate: >90 ml/min/1.73m2 (ref >59)
Glucose: 145 mg/dL — ABNORMAL HIGH (ref 65–100)
Potassium: 3.3 mmol/L — ABNORMAL LOW (ref 3.5–5.1)
Sodium: 143 mmol/L (ref 136–145)

## 2024-06-14 LAB — POCT GLUCOSE
POC Glucose: 128 mg/dL — ABNORMAL HIGH (ref 65–100)
POC Glucose: 134 mg/dL — ABNORMAL HIGH (ref 65–100)
POC Glucose: 173 mg/dL — ABNORMAL HIGH (ref 65–100)
POC Glucose: 183 mg/dL — ABNORMAL HIGH (ref 65–100)
POC Glucose: 90 mg/dL (ref 65–100)
POC Glucose: 98 mg/dL (ref 65–100)

## 2024-06-14 MED ORDER — POTASSIUM CHLORIDE CRYS ER 20 MEQ PO TBCR
20 | Freq: Once | ORAL | Status: AC
Start: 2024-06-14 — End: 2024-06-14
  Administered 2024-06-14: 17:00:00 40 meq via ORAL

## 2024-06-14 MED FILL — TYLENOL 325 MG PO TABS: 325 mg | ORAL | Qty: 2 | Fill #0

## 2024-06-14 MED FILL — OLANZAPINE 10 MG PO TABS: 10 mg | ORAL | Qty: 1 | Fill #0

## 2024-06-14 MED FILL — MUCUS RELIEF ER 600 MG PO TB12: 600 mg | ORAL | Qty: 1 | Fill #0

## 2024-06-14 MED FILL — DOXYCYCLINE HYCLATE 100 MG IV SOLR: 100 mg | INTRAVENOUS | Qty: 100 | Fill #0

## 2024-06-14 MED FILL — ELIQUIS 5 MG PO TABS: 5 mg | ORAL | Qty: 1 | Fill #0

## 2024-06-14 MED FILL — POTASSIUM CHLORIDE CRYS ER 20 MEQ PO TBCR: 20 meq | ORAL | Qty: 2 | Fill #0

## 2024-06-14 MED FILL — METOPROLOL TARTRATE 50 MG PO TABS: 50 mg | ORAL | Qty: 1 | Fill #0

## 2024-06-14 MED FILL — BUDESONIDE 0.5 MG/2ML IN SUSP: 0.5 MG/2ML | RESPIRATORY_TRACT | Qty: 2 | Fill #0

## 2024-06-14 MED FILL — IPRATROPIUM-ALBUTEROL 0.5-2.5 (3) MG/3ML IN SOLN: 0.5-2.5 (3) MG/3ML | RESPIRATORY_TRACT | Qty: 3 | Fill #0

## 2024-06-14 MED FILL — ATORVASTATIN CALCIUM 40 MG PO TABS: 40 mg | ORAL | Qty: 1 | Fill #0

## 2024-06-14 MED FILL — INSULIN LISPRO 100 UNIT/ML IJ SOLN: 100 [IU]/mL | INTRAMUSCULAR | Qty: 1 | Fill #0

## 2024-06-14 MED FILL — INSULIN LISPRO 100 UNIT/ML IJ SOLN: 100 [IU]/mL | INTRAMUSCULAR | Qty: 2 | Fill #0

## 2024-06-14 MED FILL — SERTRALINE HCL 50 MG PO TABS: 50 mg | ORAL | Qty: 2 | Fill #0

## 2024-06-14 NOTE — Plan of Care (Signed)
"OCCUPATIONAL THERAPY TREATMENT  Patient: Breanna Lester (63 y.o. female)  Date: 06/14/2024  Primary Diagnosis: Acute respiratory failure, unspecified whether with hypoxia or hypercapnia (HCC) [J96.00]  Acute on chronic congestive heart failure, unspecified heart failure type (HCC) [I50.9]  Acute hypoxic respiratory failure (HCC) [J96.01]       Precautions: General Precautions, Fall Risk, Bed Alarm                Recommendations for nursing mobility: Out of bed to chair for meals, Encourage HEP in prep for ADLs/mobility; see handout for details, Frequent repositioning to prevent skin breakdown, Use of bed/chair alarm for safety, LE elevation for management of edema, AD and gt belt for bed to chair , Amb to bathroom with AD and gait belt, Amb in hallway, and Assist x1    In place during session: EKG/telemetry   Chart, occupational therapy assessment, plan of care, and goals were reviewed.  ASSESSMENT  Patient continues with skilled OT services and is progressing towards goals. Pt presented sitting up on EOB upon COTA arrival, agreeable to session. Pt A&O x 4.     Pt sit>stand to RW w/ SBA and ambulated ~20 ft w/ RW and SBA to bathroom, where Pt stand>sit w/ SBA + verbal cues for hand placement for safe and controlled descent. Pt required SBA to complete anterior peri care while standing before commode w/ SBA for safety w/ dynamic standing balance and UE support from safety bar. Pt then STS to RW w/ SBA and ambulated to sink where Pt completed hand hygiene w/ Mod I before ambulating back into room where Pt STS back onto EOB. Pt given skilled edu on energy conservation techniques (written handouts on energ conserv, household tasks and housekeeping), safety during functional mobility (written handout on fall prevention and safely getting up from falls), PLB to maintain oxygen saturation during mobility, the importance of regular positional changes to protect skin integrity + prevent the development of orthostatic  symptoms and discussion of functional/safety concerns with discharge home w/ Pt endorsing good understanding/demonstrating good follow through at this time. Patient left positioned comfortably sitting EOB w/ all known needs met or within reach. RN updated at session end on Pt therapeutic status and participation.       (See below for objective details and assist levels).     Overall, Pt presented w/ good session tolerance this day. Pt, however, continues w/ deficits in strength and endurance impacting functional capacity to safely and Ind complete ADLS/IADLS/Functional Mobility at this time. Current OT recommendations for discharge Moderate intensity short-term skilled occupational therapy up to 5x/week. Will continue to benefit from skilled OT services, and will continue to progress as tolerated.     GOALS:  Problem: Occupational Therapy - Adult  Goal: By Discharge: Performs self-care activities at highest level of function for planned discharge setting.  See evaluation for individualized goals.  Description: FUNCTIONAL STATUS PRIOR TO ADMISSION:  Pt required assistance with ADLs and IADLs    HOME SUPPORT: The patient lived with daughter to provide assistance.    Occupational Therapy Goals:  Initiated 06/09/2024  Patient/Family stated goal: Unstated  1.  Patient will perform grooming with Independence within 7 day(s).  2.  Patient will perform upper body dressing with Independence within 7 day(s).  3.  Patient will perform lower body dressing with Minimal Assist within 7 day(s).  4.  Patient will perform toilet transfers with Moderate Assist  within 7 day(s).  5.  Patient will perform all aspects of  toileting with Moderate Assist within 7 day(s).  6.  Patient will participate in upper extremity therapeutic exercise/activities with Independence within 7 day(s).  Outcome: Progressing        PLAN :  Patient continues to benefit from skilled intervention to address functional impairments. Continue treatment per  established plan of care to address goals.    Recommend next OT session: Toileting, UB dressing, LB dressing, UB bathing, LB bathing, and standing grooming    Recommendation for discharge: (in order for the patient to meet his/her long term goals): Moderate intensity short-term skilled occupational therapy up to 5x/week    Potential barriers for safe discharge: pt has poor safety awareness, pt is a high fall risk, and concern for pt safely navigating or managing the home environment.     IF patient discharges home will need the following DME: continuing to assess with progress     SUBJECTIVE:   Patient stated I have grab bars in the bathroom at home now..    OBJECTIVE DATA SUMMARY:   Cognitive/Behavioral Status:  Orientation  Overall Orientation Status: Within Functional Limits  Orientation Level: Oriented X4  Cognition  Overall Cognitive Status: Exceptions  Arousal/Alertness: Appears intact  Following Commands: Appears intact  Attention Span: Appears intact  Memory: Appears intact  Safety Judgement: Appears intact  Problem Solving: Assistance required to correct errors made;Assistance required to identify errors made  Insights: Fully aware of deficits  Initiation: Requires cues for some  Sequencing: Requires cues for some    Functional Mobility and Transfers for ADLs:  Bed Mobility:  Bed Mobility Training  Bed Mobility Training: Yes  Overall Level of Assistance: Modified independent  Interventions: Verbal cues;Safety awareness training;Visual cues  Supine to Sit: Modified independent  Sit to Supine: Modified independent  Scooting: Modified independent    Transfers:  Art Therapist: Yes  Overall Level of Assistance: Stand by assistance  Interventions: Verbal cues;Safety awareness training;Visual cues  Sit to Stand: Stand by assistance  Stand to Sit: Stand by assistance  Bed to Chair: Stand by assistance  Toilet Transfer: Stand by assistance    Balance:  Balance  Sitting: Intact  Standing:  Impaired  Standing - Static: Good;Constant support  Standing - Dynamic: Good;Fair;Constant support    ADL Intervention:    Toileting: Stand by assistance  Toileting Skilled Clinical Factors: Pt required SBA to complete anterior peri care while    Functional Mobility: Stand by assistance  Functional Mobility Skilled Clinical Factors: standing before commode w/ SBA for safety w/ dynamic standing balance and UE support from safety bar.    Therapeutic Intervention provided:   energy conservation, functional mobility/transfers in preparation for completion of ADLs/IADLs, peri-care, and hand hygiene    Pain Rating:  0/10     Activity Tolerance:   Good    After treatment patient left in no apparent distress:   Bed locked and returned to lowest position, Call bell within reach, Updated patient's board on functional status and mobility recommendations, and Pt left in no apparent distress sitting EOB, and nsg updated     COMMUNICATION/EDUCATION:   The patients plan of care was discussed with: Registered nurse    Patient Education  Education Given To: Patient  Education Provided: Plan of Care;Fall Prevention Strategies;Energy Conservation;Mobility Training;ADL Adaptive Strategies;Equipment;Role of Therapy;IADL Safety;Transfer Training  Education Method: Verbal;Demonstration;Printed Information/Hand-outs  Barriers to Learning: None  Education Outcome: Continued education needed;Verbalized understanding;Demonstrated understanding    Thank you for this referral.  Leeroy Piety, OTA  Minutes:  24   "

## 2024-06-14 NOTE — Progress Notes (Signed)
 "  IMPRESSION:   Acute on chronic hypoxic respiratory failure  Chronic hypercapnic respiratory failure  Bilateral pneumonia improved  Cirrhosis of liver  Chronic A-fib  Additional workup outlined below      RECOMMENDATIONS/PLAN:   63 year old obese lady came in because of shortness of breath and dyspnea she is chronically on home oxygen she did not have any sleep study done in the past she has history of hypertension chronic A-fib history of EtOH cirrhosis of liver, patient also has COPD she was severely hypoxic and she was put on noninvasive ventilator CAT scan of the chest was done which showed bilateral pneumonia patient was intubated and successfully weaned off and she is using BiPAP at night and oxygen during the daytime  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 CT head was negative  Continue use antibiotic for bilateral pneumonia  Nocturnal CPAP she never had any sleep study done  Prescribe albuterol  inhaler for at home use  Patient has had worsening respiratory failure and is at risk for serious harm or death without appropriate therapy at home which include noninvasive ventilator while patient has an underlying condition of OSA it is not the primary indication for need for NIV patient has previously tried CPAP and BiPAP and has failed to improve his hypercapnia and saturation level.  Since chronic/acute respiratory failure can be prevented with the use of positive pressure ventilation and due to the nature of the patient deteriorating condition a portable volume ventilator has been ordered via mask interface for use at night as well as during the day.  The patient requires continuous access to noninvasive positive pressure ventilation therapy to reduce respiratory distress, minimize hospitalization and to control carbon dioxide retention.  Noninvasive ventilator setting BiPAP at an IPAP of 15-20 cm H2O and EPAP of 3-5 cm H2O, gradually increasing pressures to target an oxygen saturation  (SpO2) of 88-92% and improved pH and PaCO2. Higher IPAP levels (up to 25-30 cm H2O) and higher EPAP may be needed for severe hypercapnia    Continue with Bumex   Overnight pulse oximetry shows desaturation for 24 minutes qualify for home oxygen     10/22  Alert awake eating breakfast on oxygen via nasal cannula getting nebulizer treatment use the noninvasive ventilator BiPAP her pCO2 was 60 discussed with her about getting noninvasive ventilator will order one but issue is the compliance, she had a history of polysubstance abuse and will do 6-minute walk test before discharge to see if she qualifies for POC.  10/23  Patient seen and examined at the bedside getting nebulizer treatment RT was present in the room denies any chest pain shortness of breath awaiting for noninvasive ventilator which has been ordered discussed with her about her condition and using inhalers and compliance with the noninvasive ventilator once patient get the noninvasive ventilator she can be discharged patient getting nebulizer treatment also on doxycycline .  Culture positive most likely contamination continue with the Bumex .  Recommend abstinence from substance abuse.  10/24   Patient was alert and awake at room air. She is tolerating a full diet. She denies any chest pain, shortness of breath. She is awaiting her noninvasive ventilator which has been ordered. Discussed inhaler use and compliance with the ventilator upon discharge. Continue with the bumex . Recommend abstinence from substance abuse.  Okay to discharge  10/25  Patient was alert and awake using her mask this morning. She says that her NIV machine has helped a lot. She denies any chest pain or  shortness of breath today. She needs a prescription for her albuterol  rescue inhaler upon discharge. Continue with bumex . Recommend abstinence from substance abuse. Okay to discharge.  [x]  High complexity decision making was performed  [x]  See my orders for details    PMH:  has a past  medical history of Anxiety, Depression, Gastrointestinal disorder, Heart failure (HCC), Hyperlipidemia, Hypertension, Liver disease, and Pneumonia.    PSH:   has a past surgical history that includes Cataract removal; egd transoral biopsy single/multiple (05/17/2009); Dilation and curettage of uterus (1999); Hysterectomy (2008); Cesarean section (1986 and 1993); Adenoidectomy (1972); Tonsillectomy (1972); back surgery; and Upper gastrointestinal endoscopy (N/A, 04/17/2024).     FHX: family history includes Cancer in her mother; Heart Disease in her father.     SHX:  reports that she quit smoking about 16 years ago. Her smoking use included cigarettes. She has never used smokeless tobacco. She reports that she does not drink alcohol and does not use drugs.    ALL:   Allergies   Allergen Reactions    Latex Rash        MEDS:   [x]  Reviewed - As Below   []  Not reviewed    Current Facility-Administered Medications   Medication Dose Route Frequency Provider Last Rate Last Admin    ipratropium 0.5 mg-albuterol  2.5 mg (DUONEB ) nebulizer solution 1 Dose  1 Dose Inhalation BID RT Aimee Elspeth SAUNDERS, MD   1 Dose at 06/14/24 9191    guaiFENesin  (MUCINEX ) extended release tablet 600 mg  600 mg Oral BID Ronn Dixon A, PA-C   600 mg at 06/14/24 0830    benzonatate  (TESSALON ) capsule 100 mg  100 mg Oral TID PRN Ronn Dixon A, PA-C   100 mg at 06/10/24 1217    doxycycline  (VIBRAMYCIN ) 100 mg in sodium chloride  0.9 % 100 mL IVPB (addEASE)  100 mg IntraVENous BID Minai, Omar A, MD 100 mL/hr at 06/14/24 0831 100 mg at 06/14/24 0831    influenza tiss-cult vaccine (FLUCELVAX) injection 0.5 mL  1 Dose IntraMUSCular Prior to discharge Aimee Elspeth SAUNDERS, MD        metoprolol  tartrate (LOPRESSOR ) tablet 50 mg  50 mg Oral BID Azeem, Ahad, MD   50 mg at 06/14/24 0830    OLANZapine  (ZYPREXA ) tablet 10 mg  10 mg Oral Nightly Punyala, Srinivasa R, MD   10 mg at 06/13/24 2055    atorvastatin  (LIPITOR ) tablet 40 mg  40 mg Oral Nightly Azeem,  Ahad, MD   40 mg at 06/13/24 2055    haloperidol  lactate (HALDOL ) injection 5 mg  5 mg IntraVENous Q6H PRN Azeem, Ahad, MD   5 mg at 06/08/24 0438    budesonide  (PULMICORT ) nebulizer suspension 500 mcg  0.5 mg Nebulization BID RT Nunna, Krishidhar R, MD   500 mcg at 06/14/24 9191    insulin  glargine (LANTUS ) injection vial 10 Units  10 Units SubCUTAneous Daily Nunna, Krishidhar R, MD   10 Units at 06/13/24 0912    ipratropium 0.5 mg-albuterol  2.5 mg (DUONEB ) nebulizer solution 1 Dose  1 Dose Inhalation Q4H PRN Nunna, Krishidhar R, MD   1 Dose at 06/08/24 0304    insulin  lispro (HUMALOG ,ADMELOG ) injection vial 0-8 Units  0-8 Units SubCUTAneous Q4H Nunna, Krishidhar R, MD   2 Units at 06/14/24 0043    0.9 % sodium chloride  infusion   IntraVENous PRN Nunna, Krishidhar R, MD        albuterol  (PROVENTIL ) nebulizer solution 2.5 mg  2.5 mg Nebulization Q4H  PRN Nunna, Krishidhar R, MD        [Held by provider] tiotropium bromide  (SPIRIVA  RESPIMAT) 2.5 MCG/ACT inhaler 2 puff  2 puff Inhalation Daily RT Nunna, Krishidhar R, MD        [Held by provider] budesonide -formoterol  (SYMBICORT ) 160-4.5 MCG/ACT inhaler 2 puff  2 puff Inhalation BID RT Nunna, Krishidhar R, MD        hydrALAZINE  (APRESOLINE ) injection 10 mg  10 mg IntraVENous Q4H PRN Sahib, Taaliba A, APRN - NP   10 mg at 06/07/24 0202    glucose chewable tablet 16 g  4 tablet Oral PRN Nunna, Krishidhar R, MD   16 g at 06/12/24 0245    dextrose  bolus 10% 125 mL  125 mL IntraVENous PRN Nunna, Krishidhar R, MD        Or    dextrose  bolus 10% 250 mL  250 mL IntraVENous PRN Nunna, Krishidhar R, MD        glucagon  injection 1 mg  1 mg SubCUTAneous PRN Nunna, Krishidhar R, MD        dextrose  10 % infusion   IntraVENous Continuous PRN Nunna, Krishidhar R, MD        sertraline  (ZOLOFT ) tablet 100 mg  100 mg Oral Daily Nunna, Krishidhar R, MD   100 mg at 06/14/24 0830    apixaban  (ELIQUIS ) tablet 5 mg  5 mg Per NG tube BID Nunna, Krishidhar R, MD   5 mg at 06/14/24 0830    sodium  chloride flush 0.9 % injection 5-40 mL  5-40 mL IntraVENous 2 times per day Marvis Lynwood HERO, APRN - CNP   10 mL at 06/14/24 0830    sodium chloride  flush 0.9 % injection 5-40 mL  5-40 mL IntraVENous PRN Marvis Lynwood HERO, APRN - CNP        0.9 % sodium chloride  infusion   IntraVENous PRN Marvis Lynwood HERO, APRN - CNP        lidocaine  1 % injection 50 mg  50 mg IntraDERmal Once Marvis Lynwood HERO, APRN - CNP        ALTEplase  (CATHFLO) injection 2 mg  2 mg IntraCATHeter PRN Marvis Lynwood HERO, APRN - CNP        sodium chloride  flush 0.9 % injection 5-40 mL  5-40 mL IntraVENous PRN Marvis Lynwood HERO, APRN - CNP        0.9 % sodium chloride  infusion   IntraVENous PRN Marvis Lynwood HERO, APRN - CNP   Stopped at 06/05/24 2344    potassium chloride  20 mEq/50 mL IVPB (Central Line)  20 mEq IntraVENous PRN Marvis Lynwood HERO, APRN - CNP        Or    potassium chloride  10 mEq/100 mL IVPB (Peripheral Line)  10 mEq IntraVENous PRN Marvis Lynwood HERO, APRN - CNP        magnesium  sulfate 2000 mg in 50 mL IVPB premix  2,000 mg IntraVENous PRN Marvis Lynwood HERO, APRN - CNP        ondansetron  (ZOFRAN -ODT) disintegrating tablet 4 mg  4 mg Oral Q8H PRN Marvis Lynwood HERO, APRN - CNP        Or    ondansetron  (ZOFRAN ) injection 4 mg  4 mg IntraVENous Q6H PRN Marvis Lynwood HERO, APRN - CNP   4 mg at 06/07/24 1021    polyethylene glycol (GLYCOLAX ) packet 17 g  17 g Oral Daily PRN Marvis Lynwood HERO, APRN - CNP        acetaminophen  (TYLENOL ) tablet  650 mg  650 mg Oral Q6H PRN Marvis Lynwood HERO, APRN - CNP   650 mg at 06/14/24 9568    Or    acetaminophen  (TYLENOL ) suppository 650 mg  650 mg Rectal Q6H PRN Marvis Lynwood HERO, APRN - CNP            MAR reviewed and pertinent medications noted or modified as needed      MND:Ezmupwzwu items are noted in HPI.      Hemodynamics:    CO:    CI:    CVP:    SVR:   PAP Systolic:    PAP Diastolic:    PVR:    SV02:        Ventilator Settings:      Mode Rate TV Press PEEP FiO2 PIP Min. Vent   (S) CPAP/PS 16 bpm  380 mL    6 30 %  13 cmH2O           Vital Signs:  Telemetry:    AFIB Intake/Output:   BP 131/80   Pulse 61   Temp 97.7 F (36.5 C) (Oral)   Resp 16   Ht 1.575 m (5' 2)   Wt 114.5 kg (252 lb 6.8 oz)   SpO2 93%   BMI 46.16 kg/m     Temp (24hrs), Avg:98.1 F (36.7 C), Min:97.5 F (36.4 C), Max:99.5 F (37.5 C)        O2 Device: Nasal cannula O2 Flow Rate (L/min): 3 L/min       Wt Readings from Last 4 Encounters:   06/14/24 114.5 kg (252 lb 6.8 oz)   06/06/24 126.6 kg (279 lb 1.6 oz)   05/03/24 129.7 kg (286 lb)   05/05/24 129.7 kg (286 lb)          Intake/Output Summary (Last 24 hours) at 06/14/2024 0846  Last data filed at 06/14/2024 0732  Gross per 24 hour   Intake 550 ml   Output --   Net 550 ml       Last shift:      10/25 0701 - 10/25 1900  In: 300   Out: -   Last 3 shifts: 10/23 1901 - 10/25 0700  In: 482 [P.O.:472; I.V.:10]  Out: 200 [Urine:200]       Physical Exam:     General: Caucasian female; alert awake on oxygen via nasal cannula  HEENT: NCAT, poor dentition, lips and mucosa dry  Eyes: anicteric; conjunctiva clear  Neck: no nodes, trach midline; no accessory MM use.  Chest: no deformity,  Cardiac: IR regular; no murmur;   Lungs: distant breath sounds; bilateral rales  Abd: soft, NT, hypoactive BS  Ext: Bilateral leg edema; no joint swelling; No clubbing  GU: NO foley, clear urine  Neuro: No focal deficit  Psych- no agitation, oriented to person;   Skin: warm, dry, no cyanosis;   Pulses: 1-2+ Bilateral pedal, radial  Capillary: brisk; pale      DATA:  No results found for this or any previous visit.    05/02/24    ECHO (TTE) COMPLETE (PRN CONTRAST/BUBBLE/STRAIN/3D) 05/06/2024 10:52 AM (Final)    Interpretation Summary    Left Ventricle: Normal left ventricular systolic function with a visually estimated EF of 70 - 75%. Left ventricle size is normal. Normal wall thickness. Normal wall motion. Normal diastolic function.    Right Ventricle: Right ventricle is moderately dilated. Normal systolic function.    Right Atrium: Right atrium is mildly  dilated.  Pericardium: Moderate (1-2 cm) localized pericardial effusion present around the left ventricle. Pericardial posterior effusion measures 1.0 cm.    Image quality is adequate. Contrast used: Lumason . Technically difficult study, technically difficult study with poor endocardial visualization, technically difficult study due to patient's body habitus and technically difficult study due to patient's heart rhythm.    Signed by: Laquetta Commander, MD on 05/06/2024 10:52 AM       MAR reviewed and pertinent medications noted or modified as needed    MEDS:   Current Facility-Administered Medications   Medication Dose Route Frequency Provider Last Rate Last Admin    ipratropium 0.5 mg-albuterol  2.5 mg (DUONEB ) nebulizer solution 1 Dose  1 Dose Inhalation BID RT Aimee Elspeth SAUNDERS, MD   1 Dose at 06/14/24 9191    guaiFENesin  (MUCINEX ) extended release tablet 600 mg  600 mg Oral BID Ronn Dixon A, PA-C   600 mg at 06/14/24 0830    benzonatate  (TESSALON ) capsule 100 mg  100 mg Oral TID PRN Ronn Dixon A, PA-C   100 mg at 06/10/24 1217    doxycycline  (VIBRAMYCIN ) 100 mg in sodium chloride  0.9 % 100 mL IVPB (addEASE)  100 mg IntraVENous BID Minai, Omar A, MD 100 mL/hr at 06/14/24 0831 100 mg at 06/14/24 0831    influenza tiss-cult vaccine (FLUCELVAX) injection 0.5 mL  1 Dose IntraMUSCular Prior to discharge Aimee Elspeth SAUNDERS, MD        metoprolol  tartrate (LOPRESSOR ) tablet 50 mg  50 mg Oral BID Azeem, Ahad, MD   50 mg at 06/14/24 0830    OLANZapine  (ZYPREXA ) tablet 10 mg  10 mg Oral Nightly Punyala, Srinivasa R, MD   10 mg at 06/13/24 2055    atorvastatin  (LIPITOR ) tablet 40 mg  40 mg Oral Nightly Azeem, Ahad, MD   40 mg at 06/13/24 2055    haloperidol  lactate (HALDOL ) injection 5 mg  5 mg IntraVENous Q6H PRN Azeem, Ahad, MD   5 mg at 06/08/24 0438    budesonide  (PULMICORT ) nebulizer suspension 500 mcg  0.5 mg Nebulization BID RT Nunna, Krishidhar R, MD   500 mcg at 06/14/24 9191    insulin  glargine (LANTUS )  injection vial 10 Units  10 Units SubCUTAneous Daily Nunna, Krishidhar R, MD   10 Units at 06/13/24 0912    ipratropium 0.5 mg-albuterol  2.5 mg (DUONEB ) nebulizer solution 1 Dose  1 Dose Inhalation Q4H PRN Nunna, Krishidhar R, MD   1 Dose at 06/08/24 0304    insulin  lispro (HUMALOG ,ADMELOG ) injection vial 0-8 Units  0-8 Units SubCUTAneous Q4H Nunna, Krishidhar R, MD   2 Units at 06/14/24 0043    0.9 % sodium chloride  infusion   IntraVENous PRN Nunna, Krishidhar R, MD        albuterol  (PROVENTIL ) nebulizer solution 2.5 mg  2.5 mg Nebulization Q4H PRN Nunna, Krishidhar R, MD        [Held by provider] tiotropium bromide  (SPIRIVA  RESPIMAT) 2.5 MCG/ACT inhaler 2 puff  2 puff Inhalation Daily RT Nunna, Krishidhar R, MD        [Held by provider] budesonide -formoterol  (SYMBICORT ) 160-4.5 MCG/ACT inhaler 2 puff  2 puff Inhalation BID RT Nunna, Krishidhar R, MD        hydrALAZINE  (APRESOLINE ) injection 10 mg  10 mg IntraVENous Q4H PRN Sahib, Taaliba A, APRN - NP   10 mg at 06/07/24 0202    glucose chewable tablet 16 g  4 tablet Oral PRN Rawland Veleta SAUNDERS, MD   16 g at 06/12/24 0245  dextrose  bolus 10% 125 mL  125 mL IntraVENous PRN Nunna, Krishidhar R, MD        Or    dextrose  bolus 10% 250 mL  250 mL IntraVENous PRN Nunna, Krishidhar R, MD        glucagon  injection 1 mg  1 mg SubCUTAneous PRN Nunna, Krishidhar R, MD        dextrose  10 % infusion   IntraVENous Continuous PRN Nunna, Krishidhar R, MD        sertraline  (ZOLOFT ) tablet 100 mg  100 mg Oral Daily Nunna, Krishidhar R, MD   100 mg at 06/14/24 0830    apixaban  (ELIQUIS ) tablet 5 mg  5 mg Per NG tube BID Nunna, Krishidhar R, MD   5 mg at 06/14/24 0830    sodium chloride  flush 0.9 % injection 5-40 mL  5-40 mL IntraVENous 2 times per day Marvis Lynwood HERO, APRN - CNP   10 mL at 06/14/24 0830    sodium chloride  flush 0.9 % injection 5-40 mL  5-40 mL IntraVENous PRN Marvis Lynwood HERO, APRN - CNP        0.9 % sodium chloride  infusion   IntraVENous PRN Marvis Lynwood HERO, APRN - CNP         lidocaine  1 % injection 50 mg  50 mg IntraDERmal Once Marvis Lynwood HERO, APRN - CNP        ALTEplase  (CATHFLO) injection 2 mg  2 mg IntraCATHeter PRN Marvis Lynwood HERO, APRN - CNP        sodium chloride  flush 0.9 % injection 5-40 mL  5-40 mL IntraVENous PRN Marvis Lynwood HERO, APRN - CNP        0.9 % sodium chloride  infusion   IntraVENous PRN Marvis Lynwood HERO, APRN - CNP   Stopped at 06/05/24 2344    potassium chloride  20 mEq/50 mL IVPB (Central Line)  20 mEq IntraVENous PRN Marvis Lynwood HERO, APRN - CNP        Or    potassium chloride  10 mEq/100 mL IVPB (Peripheral Line)  10 mEq IntraVENous PRN Marvis Lynwood HERO, APRN - CNP        magnesium  sulfate 2000 mg in 50 mL IVPB premix  2,000 mg IntraVENous PRN Marvis Lynwood HERO, APRN - CNP        ondansetron  (ZOFRAN -ODT) disintegrating tablet 4 mg  4 mg Oral Q8H PRN Marvis Lynwood HERO, APRN - CNP        Or    ondansetron  (ZOFRAN ) injection 4 mg  4 mg IntraVENous Q6H PRN Marvis Lynwood HERO, APRN - CNP   4 mg at 06/07/24 1021    polyethylene glycol (GLYCOLAX ) packet 17 g  17 g Oral Daily PRN Marvis Lynwood HERO, APRN - CNP        acetaminophen  (TYLENOL ) tablet 650 mg  650 mg Oral Q6H PRN Marvis Lynwood HERO, APRN - CNP   650 mg at 06/14/24 9568    Or    acetaminophen  (TYLENOL ) suppository 650 mg  650 mg Rectal Q6H PRN Marvis Lynwood HERO, APRN - CNP            ARTERIAL BLOOD GAS  No results for input(s): PHART, PCO2ART, PO2ART, HCO3ART, BEART, TCO2ARRT, HGBART, PO2CORART, FIO2A, O2SATART, OXYHEM, CARBOXHGBART, METHGBART, O2CONTART, PHCORART, TEMP in the last 72 hours.    Invalid input(s): PCO2COART    Labs:    Recent Labs     06/11/24  0901 06/12/24  0433   WBC 8.4 6.8   HGB 11.2*  11.8   PLT 146* 103*     Recent Labs     06/11/24  0901 06/12/24  0433   NA 142 140   K 3.6 3.7   CL 99 103   CO2 36* 26   GLUCOSE 114* 102*   BUN 20 17   CREATININE 0.68 0.69   CALCIUM  8.9 8.5*     No results for input(s): CKTOTAL, CKMB, CKMBINDEX, TROPONINI in the last 72 hours.  Lab Results   Component  Value Date/Time    PROBNP 11,914 06/03/2024 05:54 PM      Lab Results   Component Value Date/Time    TSH 2.400 06/09/2024 01:50 PM      Results       Procedure Component Value Units Date/Time    Culture, Blood 1 [[id number]] Collected: 06/09/24 1420    Order Status: Sent Specimen: Blood Updated: 06/09/24 1451    Legionella antigen, urine [[id number]] Collected: 06/04/24 1228    Order Status: Completed Specimen: Urine Updated: 06/06/24 1636     Sample Site Urine        L pneumophila S1 Ag, Ur Negative        Comment: Presumptive negative for L. pneumophila serogroup 1 antigen in urine,  suggesting no recent or current infection. Legionnaires' disease  cannot be ruled out since other serogroups and species may also  cause disease.  Performed At: [facility]  [address] [place], [place] [id number]  Jennette Shorter MD Ey:[phone]         S. pneumonia Antigen, Urine/CSF [[id number]] Collected: 06/04/24 1228    Order Status: Completed Specimen: Urine Updated: 06/06/24 1636     Sample Site Urine        Specimen Urine     S pneumoniae Ag, CSF Negative        FLUID CULTURE Not indicated.     Organism Id Not indicated.     Please note: Comment        Comment: College of [employer]ologists standards require a culture to be  performed on CSF specimens submitted for bacterial antigen testing.  (CAP [id number]) Urine specimens will not be cultured.  Performed At: [facility]  [address] [place], [place] [id number]  Jennette Shorter MD Ey:[id number]         COVID-19 & Influenza Combo [[id number]] Collected: 06/04/24 0845    Order Status: Completed Specimen: Nasopharynx Updated: 06/04/24 0934     SARS-CoV-2, PCR Not Detected        Comment: Not Detected results do not preclude SARS-CoV-2 infection and should not be used as the sole basis for patient management decisions. Results must be combined with clinical observations, patient history, and epidemiological information        Rapid Influenza A By PCR  Not Detected        Rapid Influenza B By PCR Not Detected        Comment:    Testing was performed using cobas Liat SARS-CoV-2 and Influenza A/B nucleic acid assay.  This test is a multiplex Real-Time Reverse Transcriptas         Culture, Blood 2 [[id number]]  (Abnormal) Collected: 06/04/24 0300    Order Status: Completed Specimen: Blood Updated: 06/06/24 0836     Special Requests No Special Requests        Culture       Staphylococcus species, coagulase negative growing in 1 of 2 bottles drawn No Site Indicated  This isolate represents a common contaminant and may or may not be clinically significant. Please notify the laboratory if further work is indicated.            (NOTE) CALLED TO ;CAMBPELL IRBY RN@[email or web address] .AM    Culture, Blood, PCR ID Panel [[id number]]  (Abnormal) Collected: 06/04/24 0300    Order Status: Completed Specimen: Blood Updated: 06/05/24 1622     Accession Number Blood        Enterococcus faecalis by PCR Not detected     Enterococcus faecium by PCR Not detected     Listeria monocytogenes by PCR Not detected     STAPHYLOCOCCUS Detected        Staphylococcus Aureus Not detected     Staphylococcus epidermidis by PCR Detected        Staphylococcus lugdunensis by PCR Not detected     STREPTOCOCCUS Not detected     Streptococcus agalactiae (Group B) Not detected     Strep pneumoniae Not detected     Strep pyogenes,(Grp. A) Not detected     Acinetobacter calcoac baumannii complex by PCR Not detected     Bacteroides fragilis by PCR Not detected     Enterobacteriaceae by PCR Not detected     Enterobacter cloacae complex by PCR Not detected     Escherichia Coli Not detected     Klebsiella aerogenes by PCR Not detected     Klebsiella oxytoca by PCR Not detected     Klebsiella pneumoniae group by PCR Not detected     Proteus by PCR Not detected     Salmonella species by PCR Not detected     Serratia marcescens by PCR Not detected     Haemophilus Influenzae by PCR Not detected     Neisseria  meningitidis by PCR Not detected     Pseudomonas aeruginosa Not detected     Stenotrophomonas maltophilia by PCR Not detected     Candida albicans by PCR Not detected     Candida auris by PCR Not detected     Candida glabrata Not detected     Candida krusei by PCR Not detected     Candida parapsilosis by PCR Not detected     Candida tropicalis by PCR Not detected     Cryptococcus neoformans/gattii by PCR Not detected     Resistant gene targets          Methicillin Resistance mecA/C  by PCR Detected        [employer] test comment False positive results may rarely occur. Correlate with     Comment: clinical,epidemiologic,  and other laboratory findings  Please see BCID Interpretation Guide in EPIC Links         MRSA by PCR [[id number]] Collected: 06/04/24 0215    Order Status: Completed Specimen: Swab Updated: 06/04/24 0443     MRSA by PCR Not Detected       Culture, C Auris Screen [[id number]] Collected: 06/04/24 0215    Order Status: Completed Specimen: Swab Updated: 06/07/24 1449     Special Requests No Special Requests        Culture No Candida auris present       Culture, Blood 1 [[id number]] Collected: 06/04/24 0124    Order Status: Completed Specimen: Blood Updated: 06/10/24 1431     Special Requests No Special Requests        Culture No growth 6 days       Culture, Urine [[id number]] Collected: 06/03/24 2204    Order Status: Completed  Specimen: Urine Updated: 06/05/24 9188     Special Requests --        No Special Requests  Reflexed from [id number]       Culture No Growth (<1000 cfu/mL)              Imaging:  XR CHEST PORTABLE   Final Result      Persistent but improved diffuse bilateral pulmonary infiltrates.         Electronically signed by DARICE COLON      CTA HEAD NECK W CONTRAST   Final Result         1. No acute large vessel arterial occlusion. Mild to moderate atherosclerotic   changes.   2. Pulmonary hypertension.   3. Biapical airspace disease likely infectious/inflammatory.               Electronically  signed by Aletha CHRISTELLA Ham      CT BRAIN PERFUSION   Final Result         1. No acute large vessel arterial occlusion. Mild to moderate atherosclerotic   changes.   2. Pulmonary hypertension.   3. Biapical airspace disease likely infectious/inflammatory.               Electronically signed by Aletha CHRISTELLA Ham      CT HEAD WO CONTRAST   Final Result   No acute intracranial findings.         Electronically signed by PAULINE BLANCH      XR CHEST PORTABLE   Final Result   1. No change bilateral pneumonia         Electronically signed by Evalene JINNY Hoit      CT HEAD WO CONTRAST   Final Result      No acute intracranial abnormality.         Electronically signed by Toribio JINNY Musick      CTA CHEST W WO CONTRAST   Final Result   Extensive bilateral pulmonary infiltrates. No evidence of pulmonary embolism.         Electronically signed by DARICE COLON      XR CHEST PORTABLE   Final Result   1. Enlarged cardiac silhouette, worsening airspace disease or pulmonary edema.   No complicating features post intubation            Electronically signed by Susie Brain      XR CHEST PORTABLE   Final Result   Cardiomegaly and mild interstitial pulmonary edema.             Electronically signed by JIMMY HABIB          No results found.               "

## 2024-06-14 NOTE — Progress Notes (Signed)
 "Pulmonary and Critical Care progress note    Subjective:     This patient has been seen and evaluated at the request of Dr. Phill    63 year old obese Caucasian lady  I am asked to see for acute respiratory failure with hypoxia and hypercapnia    Patient has a past medical history significant for chronic hypoxic respiratory failure on 3 L nasal cannula secondary to COPD, obstructive sleep apnea on CPAP, anxiety, depression, HTN, A-fib on Eliquis , T2DM, pancreatitis and EtOH cirrhosis     Presented to ED on 06/03/2024 for worsening dyspnea.  In ED, patient severely hypoxic with increased work of breathing which was refractory to BiPAP, requiring intubation.  CTA chest shows extensive bilateral pulmonary infiltrates concerning for severe bilateral pneumonia, however negative for PE.  Started on empiric IV ceftriaxone  and azithromycin .  Respiratory status improved and patient was able to be extubated to BiPAP and further decreased to home dose 3 L NC.  However, patient remained confused so psychiatry was consulted.  Started on Zoloft  and Zyprexa .  Stroke alert was called on 10/17 for left-sided weakness with complete left hemianopia.  CT head, CTA head/neck and CT brain perfusion with no acute findings.  Patient unable to tolerate MRI.  Neurology evaluated, consider EEG if clinical status worsening otherwise no further stroke work up.      Transferred to medical service on 10/20.  I was asked to see the patient to take over her pulmonary management by intensivist Dr. Azeem.    ABGs 06/09/2024 showed:  pH of 7.40, pCO2 60, pO2 81, bicarb 37, saturation 96% on BiPAP with 32% FiO2    Pt with increased work of breathing and lethargy on 10/21 with repeat ABG showing worsening hypercapnia of 60.  Started on nightly BiPAP with significant improvement in breathing and normalization of mental status.     Echocardiogram 05/05/2024 reviewed personally showing:  LVEF 70 to 75%  Dilated RA and RV    Last chest x-ray 06/05/24  reviewed personally showing:  Patient was still intubated at that time  Bilateral infiltrates    Currently on nasal cannula oxygen    06/11/2024:    Patient seen and examined  Overnight events noted    Sitting up in the bed comfortably  Having breakfast  On 3 L nasal cannula oxygen  Improved respiratory status  Much more awake and alert and interactive and answering questions appropriately  Has used BiPAP 16/8, 30% FiO2 overnight  ABGs reviewed showing pH of 7.40, pCO2 57, pO2 78, bicarb 34, saturation 95%    06/12/2024:    Patient seen and examined  Overnight events noted  Sitting up in bed comfortably  Awake and alert  Having breakfast  Seated nasal cannula oxygen  Used BiPAP 14/8, 40% FiO2 overnight  No acute distress  ABGs and imaging reviewed    06/13/2024:    Patient seen and examined  Overnight events noted  Sitting up in bed comfortably  Awake and alert  Says she did not use her BiPAP last night  Noninvasive ventilator has been ordered for the patient to DME company  Gentle diuresis  Admits to improving shortness of breath    06/13/2024  Examined at bedside  Alert and responding appropriately  She is on trilogy auto ventilator.  Used for 4 hours last night  Tolerated well and getting good feel for it    Review of Systems:  Pertinent items are noted in HPI.    Past Medical History:  Diagnosis Date    Anxiety     Depression     Gastrointestinal disorder     Heart failure (HCC)     Hyperlipidemia     Hypertension     Liver disease 2009    liver failure    Pneumonia      Past Surgical History:   Procedure Laterality Date    ADENOIDECTOMY  1972    BACK SURGERY      cement discs following a fall    CATARACT REMOVAL      CESAREAN SECTION  1986 and 1993    DILATION AND CURETTAGE OF UTERUS  1999    EGD TRANSORAL BIOPSY SINGLE/MULTIPLE  05/17/2009         HYSTERECTOMY (CERVIX STATUS UNKNOWN)  2008    TONSILLECTOMY  1972    UPPER GASTROINTESTINAL ENDOSCOPY N/A 04/17/2024    ESOPHAGOGASTRODUODENOSCOPY performed by Madlyn Fendt, MD at [facility] ENDOSCOPY      Family History   Problem Relation Age of Onset    Cancer Mother     Heart Disease Father      Social History     Tobacco Use    Smoking status: Former     Current packs/day: 0.00     Types: Cigarettes     Quit date: 02/28/2008     Years since quitting: 16.3    Smokeless tobacco: Never   Substance Use Topics    Alcohol use: Never      Current Facility-Administered Medications   Medication Dose Route Frequency Provider Last Rate Last Admin    ipratropium 0.5 mg-albuterol  2.5 mg (DUONEB ) nebulizer solution 1 Dose  1 Dose Inhalation BID RT Aimee Elspeth SAUNDERS, MD   1 Dose at 06/14/24 9191    guaiFENesin  (MUCINEX ) extended release tablet 600 mg  600 mg Oral BID Ronn Dixon A, PA-C   600 mg at 06/14/24 0830    benzonatate  (TESSALON ) capsule 100 mg  100 mg Oral TID PRN Ronn Dixon A, PA-C   100 mg at 06/10/24 1217    doxycycline  (VIBRAMYCIN ) 100 mg in sodium chloride  0.9 % 100 mL IVPB (addEASE)  100 mg IntraVENous BID Minai, Mancel LABOR, MD   Stopped at 06/14/24 1022    influenza tiss-cult vaccine (FLUCELVAX) injection 0.5 mL  1 Dose IntraMUSCular Prior to discharge Aimee Elspeth SAUNDERS, MD        metoprolol  tartrate (LOPRESSOR ) tablet 50 mg  50 mg Oral BID Azeem, Ahad, MD   50 mg at 06/14/24 0830    OLANZapine  (ZYPREXA ) tablet 10 mg  10 mg Oral Nightly Punyala, Srinivasa R, MD   10 mg at 06/13/24 2055    atorvastatin  (LIPITOR ) tablet 40 mg  40 mg Oral Nightly Azeem, Ahad, MD   40 mg at 06/13/24 2055    haloperidol  lactate (HALDOL ) injection 5 mg  5 mg IntraVENous Q6H PRN Azeem, Ahad, MD   5 mg at 06/08/24 0438    budesonide  (PULMICORT ) nebulizer suspension 500 mcg  0.5 mg Nebulization BID RT Nunna, Krishidhar R, MD   500 mcg at 06/14/24 0808    insulin  glargine (LANTUS ) injection vial 10 Units  10 Units SubCUTAneous Daily Nunna, Krishidhar R, MD   10 Units at 06/13/24 0912    ipratropium 0.5 mg-albuterol  2.5 mg (DUONEB ) nebulizer solution 1 Dose  1 Dose Inhalation Q4H PRN Nunna, Krishidhar  R, MD   1 Dose at 06/08/24 0304    insulin  lispro (HUMALOG ,ADMELOG ) injection vial 0-8 Units  0-8 Units SubCUTAneous Q4H  Nunna, Krishidhar R, MD   2 Units at 06/14/24 0043    0.9 % sodium chloride  infusion   IntraVENous PRN Nunna, Krishidhar R, MD        albuterol  (PROVENTIL ) nebulizer solution 2.5 mg  2.5 mg Nebulization Q4H PRN Nunna, Krishidhar R, MD        [Held by provider] tiotropium bromide  (SPIRIVA  RESPIMAT) 2.5 MCG/ACT inhaler 2 puff  2 puff Inhalation Daily RT Nunna, Krishidhar R, MD        [Held by provider] budesonide -formoterol  (SYMBICORT ) 160-4.5 MCG/ACT inhaler 2 puff  2 puff Inhalation BID RT Nunna, Krishidhar R, MD        hydrALAZINE  (APRESOLINE ) injection 10 mg  10 mg IntraVENous Q4H PRN Sahib, Taaliba A, APRN - NP   10 mg at 06/07/24 0202    glucose chewable tablet 16 g  4 tablet Oral PRN Nunna, Krishidhar R, MD   16 g at 06/12/24 0245    dextrose  bolus 10% 125 mL  125 mL IntraVENous PRN Nunna, Krishidhar R, MD        Or    dextrose  bolus 10% 250 mL  250 mL IntraVENous PRN Nunna, Krishidhar R, MD        glucagon  injection 1 mg  1 mg SubCUTAneous PRN Nunna, Krishidhar R, MD        dextrose  10 % infusion   IntraVENous Continuous PRN Nunna, Krishidhar R, MD        sertraline  (ZOLOFT ) tablet 100 mg  100 mg Oral Daily Nunna, Krishidhar R, MD   100 mg at 06/14/24 0830    apixaban  (ELIQUIS ) tablet 5 mg  5 mg Per NG tube BID Nunna, Krishidhar R, MD   5 mg at 06/14/24 0830    sodium chloride  flush 0.9 % injection 5-40 mL  5-40 mL IntraVENous 2 times per day Marvis Lynwood HERO, APRN - CNP   10 mL at 06/14/24 0830    sodium chloride  flush 0.9 % injection 5-40 mL  5-40 mL IntraVENous PRN Marvis Lynwood HERO, APRN - CNP        0.9 % sodium chloride  infusion   IntraVENous PRN Marvis Lynwood HERO, APRN - CNP        lidocaine  1 % injection 50 mg  50 mg IntraDERmal Once Marvis Lynwood HERO, APRN - CNP        ALTEplase  (CATHFLO) injection 2 mg  2 mg IntraCATHeter PRN Marvis Lynwood HERO, APRN - CNP        sodium chloride  flush 0.9 % injection  5-40 mL  5-40 mL IntraVENous PRN Marvis Lynwood HERO, APRN - CNP        0.9 % sodium chloride  infusion   IntraVENous PRN Marvis Lynwood HERO, APRN - CNP   Stopped at 06/05/24 2344    potassium chloride  20 mEq/50 mL IVPB (Central Line)  20 mEq IntraVENous PRN Marvis Lynwood HERO, APRN - CNP        Or    potassium chloride  10 mEq/100 mL IVPB (Peripheral Line)  10 mEq IntraVENous PRN Marvis Lynwood HERO, APRN - CNP        magnesium  sulfate 2000 mg in 50 mL IVPB premix  2,000 mg IntraVENous PRN Marvis Lynwood HERO, APRN - CNP        ondansetron  (ZOFRAN -ODT) disintegrating tablet 4 mg  4 mg Oral Q8H PRN Marvis Lynwood HERO, APRN - CNP        Or    ondansetron  (ZOFRAN ) injection 4 mg  4 mg IntraVENous Q6H  PRN Marvis Lynwood HERO, APRN - CNP   4 mg at 06/07/24 1021    polyethylene glycol (GLYCOLAX ) packet 17 g  17 g Oral Daily PRN Marvis Lynwood HERO, APRN - CNP        acetaminophen  (TYLENOL ) tablet 650 mg  650 mg Oral Q6H PRN Marvis Lynwood HERO, APRN - CNP   650 mg at 06/14/24 1247    Or    acetaminophen  (TYLENOL ) suppository 650 mg  650 mg Rectal Q6H PRN Marvis Lynwood HERO, APRN - CNP              Allergies   Allergen Reactions    Latex Rash           Objective:     Blood pressure 118/76, pulse 58, temperature 97.6 F (36.4 C), temperature source Oral, resp. rate 17, height 1.575 m (5' 2), weight 114.5 kg (252 lb 6.8 oz), SpO2 93%. Temp (24hrs), Avg:97.7 F (36.5 C), Min:97.5 F (36.4 C), Max:98.1 F (36.7 C)      Intake and Output:  Current Shift: 10/25 0701 - 10/25 1900  In: 300   Out: -   Last 3 Shifts: 10/23 1901 - 10/25 0700  In: 482 [P.O.:472; I.V.:10]  Out: 200 [Urine:200]  @INTAKEOUTPUTBRIEF @     Physical Exam:     General: Lying in bed comfortably, mild respiratory distress.  On nasal cannula oxygen.  Obese  Eye: Reactive, symmetric  Throat and Neck: Supple  Lung: Reduced air entry bilaterally with prolonged exhalation.  Occasional wheezing.  Bilateral crackles   Heart: S1+S2.  No murmurs  Abdomen: soft, non-tender. Bowel sounds normal. No masses;  obese  Extremities: No edema  GU: Not done  Skin: No cyanosis  Neurologic: A & O x3.  Grossly nonfocal  Psychiatric: Mildly anxious      Lab/Data Review:      Recent Results (from the past 24 hours)   POCT Glucose    Collection Time: 06/13/24  8:01 PM   Result Value Ref Range    POC Glucose 173 (H) 65 - 100 mg/dL    Performed by: Olalade Karla Joie    POCT Glucose    Collection Time: 06/14/24 12:32 AM   Result Value Ref Range    POC Glucose 183 (H) 65 - 100 mg/dL    Performed by: Acie Fish    POCT Glucose    Collection Time: 06/14/24  4:41 AM   Result Value Ref Range    POC Glucose 90 65 - 100 mg/dL    Performed by: Olalade Karla Joie    POCT Glucose    Collection Time: 06/14/24  8:20 AM   Result Value Ref Range    POC Glucose 98 65 - 100 mg/dL    Performed by: Okey Sor    Basic Metabolic Panel    Collection Time: 06/14/24 10:10 AM   Result Value Ref Range    Sodium 143 136 - 145 mmol/L    Potassium 3.3 (L) 3.5 - 5.1 mmol/L    Chloride 102 98 - 107 mmol/L    CO2 34 (H) 20 - 29 mmol/L    Anion Gap 7 2 - 14 mmol/L    Glucose 145 (H) 65 - 100 mg/dL    BUN 13 8 - 23 mg/dL    Creatinine 9.33 9.39 - 1.00 mg/dL    BUN/Creatinine Ratio 20 12 - 20      Est, Glom Filt Rate >90 >59 ml/min/1.52m2    Calcium  8.9 8.8 -  10.2 mg/dL   CBC with Auto Differential    Collection Time: 06/14/24 10:10 AM   Result Value Ref Range    WBC 6.3 3.6 - 11.0 K/uL    RBC 4.05 3.80 - 5.20 M/uL    Hemoglobin 10.1 (L) 11.5 - 16.0 g/dL    Hematocrit 65.3 (L) 35.0 - 47.0 %    MCV 85.4 80.0 - 99.0 FL    MCH 24.9 (L) 26.0 - 34.0 PG    MCHC 29.2 (L) 30.0 - 36.5 g/dL    RDW 82.9 (H) 88.4 - 14.5 %    Platelets 97 (L) 150 - 400 K/uL    MPV 13.1 (H) 8.9 - 12.9 FL    Nucleated RBCs 0.0 0.0 PER 100 WBC    nRBC 0.00 0.00 - 0.01 K/uL    Neutrophils % 61.7 32.0 - 75.0 %    Lymphocytes % 27.5 12.0 - 49.0 %    Monocytes % 8.7 5.0 - 13.0 %    Eosinophils % 1.0 0.0 - 7.0 %    Basophils % 0.8 0.0 - 1.0 %    Immature Granulocytes % 0.3 0 - 0.5 %    Neutrophils  Absolute 3.89 1.80 - 8.00 K/UL    Lymphocytes Absolute 1.73 0.80 - 3.50 K/UL    Monocytes Absolute 0.55 0.00 - 1.00 K/UL    Eosinophils Absolute 0.06 0.00 - 0.40 K/UL    Basophils Absolute 0.05 0.00 - 0.10 K/UL    Immature Granulocytes Absolute 0.02 0.00 - 0.04 K/UL    Differential Type AUTOMATED     POCT Glucose    Collection Time: 06/14/24 11:10 AM   Result Value Ref Range    POC Glucose 128 (H) 65 - 100 mg/dL    Performed by: Charley Purple    POCT Glucose    Collection Time: 06/14/24  3:22 PM   Result Value Ref Range    POC Glucose 134 (H) 65 - 100 mg/dL    Performed by: Charley Purple        XR CHEST PORTABLE   Final Result      Persistent but improved diffuse bilateral pulmonary infiltrates.         Electronically signed by DARICE COLON      CTA HEAD NECK W CONTRAST   Final Result         1. No acute large vessel arterial occlusion. Mild to moderate atherosclerotic   changes.   2. Pulmonary hypertension.   3. Biapical airspace disease likely infectious/inflammatory.               Electronically signed by Aletha CHRISTELLA Ham      CT BRAIN PERFUSION   Final Result         1. No acute large vessel arterial occlusion. Mild to moderate atherosclerotic   changes.   2. Pulmonary hypertension.   3. Biapical airspace disease likely infectious/inflammatory.               Electronically signed by Aletha CHRISTELLA Ham      CT HEAD WO CONTRAST   Final Result   No acute intracranial findings.         Electronically signed by PAULINE BLANCH      XR CHEST PORTABLE   Final Result   1. No change bilateral pneumonia         Electronically signed by Evalene JINNY Hoit      CT HEAD WO CONTRAST   Final Result  No acute intracranial abnormality.         Electronically signed by Toribio PARAS Musick      CTA CHEST W WO CONTRAST   Final Result   Extensive bilateral pulmonary infiltrates. No evidence of pulmonary embolism.         Electronically signed by DARICE COLON      XR CHEST PORTABLE   Final Result   1. Enlarged cardiac silhouette, worsening  airspace disease or pulmonary edema.   No complicating features post intubation            Electronically signed by Susie Brain      XR CHEST PORTABLE   Final Result   Cardiomegaly and mild interstitial pulmonary edema.             Electronically signed by JIMMY HABIB            Assessment:     1.  Acute on chronic respiratory failure with hypercapnia  2.  Acute on chronic respiratory failure with hypoxia  3.  Bilateral pneumonia  4.  Acute metabolic encephalopathy  5.  Acute exacerbation of COPD  6.  Obstructive sleep apnea  7.  Liver cirrhosis  8.  Chronic atrial fibrillation  9.  Obesity  10.  Polysubstance abuse  11.  Anemia and thrombocytopenia    Plan:     Patient seen on the medical floor  Being watched here closely    On BiPAP 16/8, 30% FiO2 overnight  Now on 3 L nasal cannula oxygen  Mental status much improved  ABGs showing persistent hypercapnic respiratory failure despite BiPAP which represents failure of BiPAP.   Noninvasive ventilation/trilogy arranged for her.  She used it for 4 hours last night and tolerated it well.       Bilateral pneumonia  Continue broad-spectrum antibiotics  Cultures sent  Expanded pneumonia workup sent  Further changes in antibiotics based on clinical response and culture results  Last chest x-ray showing extensive bilateral infiltrates  Will repeat chest x-ray this a.m.  Start doxycycline   Underlying COPD  Continue Solu-Medrol   Continue nebulizers  Will need PFTs in outpatient after discharge    Altered mental status likely combination of hypoxia, hypercapnia, polysubstance abuse  UDS positive for amphetamines  Ammonia normal  Neurology evaluated recommended EEG depending on clinical status  Psychiatry following and have recommended Xolox and Zyprexa   Monitor neurological status with treatment    Liver cirrhosis  Will need to see GI in outpatient  Stable for now    DVT and GI prophylaxis    Clinically improved  Out of bed to chair with PT OT  Will follow-up with me in  outpatient clinic after discharge for PFTs and sleep evaluation    Questions of patient were answered at bedside in detail  Case discussed in detail with RN, RT, and care team including hospitalist  Thank you for involving me in the care of this patient  I will follow with you closely during hospitalization    Time spent more than 30 minutes in direct patient care with no overlap reviewing results and records, decision making, and answering questions.      Calleen Grates, MD  [facility] (PAT)  06/14/2024  6:04 PM             "

## 2024-06-14 NOTE — Care Coordination (Signed)
"  CM followed up with patient.  She is going to appeal the discharge from the hospital.  She stated she called and they were experiencing High Call Volume She left her name and number for a call back to appeal discharge.  CM team will await request for clinicals.   "

## 2024-06-14 NOTE — Progress Notes (Signed)
 "  IMPRESSION:   Acute on chronic hypoxic respiratory failure  Chronic hypercapnic respiratory failure  Bilateral pneumonia improved  Cirrhosis of liver  Chronic A-fib      RECOMMENDATIONS/PLAN:   63 year old obese lady came in because of shortness of breath and dyspnea she is chronically on home oxygen she did not have any sleep study done in the past she has history of hypertension chronic A-fib history of EtOH cirrhosis of liver, patient also has COPD she was severely hypoxic and she was put on noninvasive ventilator CAT scan of the chest was done which showed bilateral pneumonia patient was intubated and successfully weaned off and she is using BiPAP at night and oxygen during the daytime  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 CT head was negative  Continue use antibiotic for bilateral pneumonia  Nocturnal CPAP she never had any sleep study done  Prescribe albuterol  inhaler for at home use  Patient has had worsening respiratory failure and is at risk for serious harm or death without appropriate therapy at home which include noninvasive ventilator while patient has an underlying condition of OSA it is not the primary indication for need for NIV patient has previously tried CPAP and BiPAP and has failed to improve his hypercapnia and saturation level.  Since chronic/acute respiratory failure can be prevented with the use of positive pressure ventilation and due to the nature of the patient deteriorating condition a portable volume ventilator has been ordered via mask interface for use at night as well as during the day.  The patient requires continuous access to noninvasive positive pressure ventilation therapy to reduce respiratory distress, minimize hospitalization and to control carbon dioxide retention.  Noninvasive ventilator setting BiPAP at an IPAP of 15-20 cm H2O and EPAP of 3-5 cm H2O, gradually increasing pressures to target an oxygen saturation (SpO2) of 88-92% and improved pH  and PaCO2. Higher IPAP levels (up to 25-30 cm H2O) and higher EPAP may be needed for severe hypercapnia    Continue with Bumex   Overnight pulse oximetry shows desaturation for 24 minutes qualify for home oxygen she is on noninvasive ventilator using it every night and daytime as needed     10/22  Alert awake eating breakfast on oxygen via nasal cannula getting nebulizer treatment use the noninvasive ventilator BiPAP her pCO2 was 60 discussed with her about getting noninvasive ventilator will order one but issue is the compliance, she had a history of polysubstance abuse and will do 6-minute walk test before discharge to see if she qualifies for POC.  10/23  Patient seen and examined at the bedside getting nebulizer treatment RT was present in the room denies any chest pain shortness of breath awaiting for noninvasive ventilator which has been ordered discussed with her about her condition and using inhalers and compliance with the noninvasive ventilator once patient get the noninvasive ventilator she can be discharged patient getting nebulizer treatment also on doxycycline .  Culture positive most likely contamination continue with the Bumex .  Recommend abstinence from substance abuse.  10/24   Patient was alert and awake at room air. She is tolerating a full diet. She denies any chest pain, shortness of breath. She is awaiting her noninvasive ventilator which has been ordered. Discussed inhaler use and compliance with the ventilator upon discharge. Continue with the bumex . Recommend abstinence from substance abuse.  Okay to discharge  10/25  Patient was alert and awake using her mask this morning. She says that her NIV machine has helped  a lot. She denies any chest pain or shortness of breath today. She needs a prescription for her albuterol  rescue inhaler upon discharge. Continue with bumex . Recommend abstinence from substance abuse. Okay to discharge.  Asking for albuterol  inhaler as an outpatient.  [x]  High  complexity decision making was performed  [x]  See my orders for details    PMH:  has a past medical history of Anxiety, Depression, Gastrointestinal disorder, Heart failure (HCC), Hyperlipidemia, Hypertension, Liver disease, and Pneumonia.    PSH:   has a past surgical history that includes Cataract removal; egd transoral biopsy single/multiple (05/17/2009); Dilation and curettage of uterus (1999); Hysterectomy (2008); Cesarean section (1986 and 1993); Adenoidectomy (1972); Tonsillectomy (1972); back surgery; and Upper gastrointestinal endoscopy (N/A, 04/17/2024).     FHX: family history includes Cancer in her mother; Heart Disease in her father.     SHX:  reports that she quit smoking about 16 years ago. Her smoking use included cigarettes. She has never used smokeless tobacco. She reports that she does not drink alcohol and does not use drugs.    ALL:   Allergies   Allergen Reactions    Latex Rash        MEDS:   [x]  Reviewed - As Below   []  Not reviewed    Current Facility-Administered Medications   Medication Dose Route Frequency Provider Last Rate Last Admin    ipratropium 0.5 mg-albuterol  2.5 mg (DUONEB ) nebulizer solution 1 Dose  1 Dose Inhalation BID RT Aimee Elspeth SAUNDERS, MD   1 Dose at 06/14/24 9191    guaiFENesin  (MUCINEX ) extended release tablet 600 mg  600 mg Oral BID Gillard, Madison A, PA-C   600 mg at 06/14/24 0830    benzonatate  (TESSALON ) capsule 100 mg  100 mg Oral TID PRN Ronn Dixon A, PA-C   100 mg at 06/10/24 1217    doxycycline  (VIBRAMYCIN ) 100 mg in sodium chloride  0.9 % 100 mL IVPB (addEASE)  100 mg IntraVENous BID Minai, Omar A, MD 100 mL/hr at 06/14/24 0831 100 mg at 06/14/24 0831    influenza tiss-cult vaccine (FLUCELVAX) injection 0.5 mL  1 Dose IntraMUSCular Prior to discharge Aimee Elspeth SAUNDERS, MD        metoprolol  tartrate (LOPRESSOR ) tablet 50 mg  50 mg Oral BID Azeem, Ahad, MD   50 mg at 06/14/24 0830    OLANZapine  (ZYPREXA ) tablet 10 mg  10 mg Oral Nightly Punyala, Srinivasa R,  MD   10 mg at 06/13/24 2055    atorvastatin  (LIPITOR ) tablet 40 mg  40 mg Oral Nightly Azeem, Ahad, MD   40 mg at 06/13/24 2055    haloperidol  lactate (HALDOL ) injection 5 mg  5 mg IntraVENous Q6H PRN Azeem, Ahad, MD   5 mg at 06/08/24 0438    budesonide  (PULMICORT ) nebulizer suspension 500 mcg  0.5 mg Nebulization BID RT Nunna, Krishidhar R, MD   500 mcg at 06/14/24 9191    insulin  glargine (LANTUS ) injection vial 10 Units  10 Units SubCUTAneous Daily Nunna, Krishidhar R, MD   10 Units at 06/13/24 0912    ipratropium 0.5 mg-albuterol  2.5 mg (DUONEB ) nebulizer solution 1 Dose  1 Dose Inhalation Q4H PRN Nunna, Krishidhar R, MD   1 Dose at 06/08/24 0304    insulin  lispro (HUMALOG ,ADMELOG ) injection vial 0-8 Units  0-8 Units SubCUTAneous Q4H Nunna, Krishidhar R, MD   2 Units at 06/14/24 0043    0.9 % sodium chloride  infusion   IntraVENous PRN Nunna, Krishidhar R, MD  albuterol  (PROVENTIL ) nebulizer solution 2.5 mg  2.5 mg Nebulization Q4H PRN Nunna, Krishidhar R, MD        [Held by provider] tiotropium bromide  (SPIRIVA  RESPIMAT) 2.5 MCG/ACT inhaler 2 puff  2 puff Inhalation Daily RT Nunna, Krishidhar R, MD        [Held by provider] budesonide -formoterol  (SYMBICORT ) 160-4.5 MCG/ACT inhaler 2 puff  2 puff Inhalation BID RT Nunna, Krishidhar R, MD        hydrALAZINE  (APRESOLINE ) injection 10 mg  10 mg IntraVENous Q4H PRN Sahib, Taaliba A, APRN - NP   10 mg at 06/07/24 0202    glucose chewable tablet 16 g  4 tablet Oral PRN Nunna, Krishidhar R, MD   16 g at 06/12/24 0245    dextrose  bolus 10% 125 mL  125 mL IntraVENous PRN Nunna, Krishidhar R, MD        Or    dextrose  bolus 10% 250 mL  250 mL IntraVENous PRN Nunna, Krishidhar R, MD        glucagon  injection 1 mg  1 mg SubCUTAneous PRN Nunna, Krishidhar R, MD        dextrose  10 % infusion   IntraVENous Continuous PRN Nunna, Krishidhar R, MD        sertraline  (ZOLOFT ) tablet 100 mg  100 mg Oral Daily Nunna, Krishidhar R, MD   100 mg at 06/14/24 0830    apixaban   (ELIQUIS ) tablet 5 mg  5 mg Per NG tube BID Nunna, Krishidhar R, MD   5 mg at 06/14/24 0830    sodium chloride  flush 0.9 % injection 5-40 mL  5-40 mL IntraVENous 2 times per day Marvis Lynwood HERO, APRN - CNP   10 mL at 06/14/24 0830    sodium chloride  flush 0.9 % injection 5-40 mL  5-40 mL IntraVENous PRN Marvis Lynwood HERO, APRN - CNP        0.9 % sodium chloride  infusion   IntraVENous PRN Marvis Lynwood HERO, APRN - CNP        lidocaine  1 % injection 50 mg  50 mg IntraDERmal Once Marvis Lynwood HERO, APRN - CNP        ALTEplase  (CATHFLO) injection 2 mg  2 mg IntraCATHeter PRN Marvis Lynwood HERO, APRN - CNP        sodium chloride  flush 0.9 % injection 5-40 mL  5-40 mL IntraVENous PRN Marvis Lynwood HERO, APRN - CNP        0.9 % sodium chloride  infusion   IntraVENous PRN Marvis Lynwood HERO, APRN - CNP   Stopped at 06/05/24 2344    potassium chloride  20 mEq/50 mL IVPB (Central Line)  20 mEq IntraVENous PRN Marvis Lynwood HERO, APRN - CNP        Or    potassium chloride  10 mEq/100 mL IVPB (Peripheral Line)  10 mEq IntraVENous PRN Marvis Lynwood HERO, APRN - CNP        magnesium  sulfate 2000 mg in 50 mL IVPB premix  2,000 mg IntraVENous PRN Marvis Lynwood HERO, APRN - CNP        ondansetron  (ZOFRAN -ODT) disintegrating tablet 4 mg  4 mg Oral Q8H PRN Marvis Lynwood HERO, APRN - CNP        Or    ondansetron  (ZOFRAN ) injection 4 mg  4 mg IntraVENous Q6H PRN Marvis Lynwood HERO, APRN - CNP   4 mg at 06/07/24 1021    polyethylene glycol (GLYCOLAX ) packet 17 g  17 g Oral Daily PRN Marvis Lynwood HERO, APRN -  CNP        acetaminophen  (TYLENOL ) tablet 650 mg  650 mg Oral Q6H PRN Marvis Lynwood HERO, APRN - CNP   650 mg at 06/14/24 9568    Or    acetaminophen  (TYLENOL ) suppository 650 mg  650 mg Rectal Q6H PRN Marvis Lynwood HERO, APRN - CNP            MAR reviewed and pertinent medications noted or modified as needed      MND:Ezmupwzwu items are noted in HPI.      Hemodynamics:    CO:    CI:    CVP:    SVR:   PAP Systolic:    PAP Diastolic:    PVR:    SV02:        Ventilator Settings:      Mode Rate TV Press  PEEP FiO2 PIP Min. Vent   (S) CPAP/PS 16 bpm  380 mL    6 30 %  13 cmH2O           Vital Signs: Telemetry:    AFIB Intake/Output:   BP 131/80   Pulse 61   Temp 97.7 F (36.5 C) (Oral)   Resp 16   Ht 1.575 m (5' 2)   Wt 114.5 kg (252 lb 6.8 oz)   SpO2 93%   BMI 46.16 kg/m     Temp (24hrs), Avg:98.1 F (36.7 C), Min:97.5 F (36.4 C), Max:99.5 F (37.5 C)        O2 Device: Nasal cannula O2 Flow Rate (L/min): 3 L/min       Wt Readings from Last 4 Encounters:   06/14/24 114.5 kg (252 lb 6.8 oz)   06/06/24 126.6 kg (279 lb 1.6 oz)   05/03/24 129.7 kg (286 lb)   05/05/24 129.7 kg (286 lb)          Intake/Output Summary (Last 24 hours) at 06/14/2024 0856  Last data filed at 06/14/2024 0732  Gross per 24 hour   Intake 550 ml   Output --   Net 550 ml       Last shift:      10/25 0701 - 10/25 1900  In: 300   Out: -   Last 3 shifts: 10/23 1901 - 10/25 0700  In: 482 [P.O.:472; I.V.:10]  Out: 200 [Urine:200]       Physical Exam:     General: Caucasian female; alert awake on oxygen via nasal cannula  HEENT: NCAT, poor dentition, lips and mucosa dry  Eyes: anicteric; conjunctiva clear  Neck: no nodes, trach midline; no accessory MM use.  Chest: no deformity,  Cardiac: IR regular; no murmur;   Lungs: distant breath sounds; bilateral rales  Abd: soft, NT, hypoactive BS  Ext: Bilateral leg edema; no joint swelling; No clubbing  GU: NO foley, clear urine  Neuro: No focal deficit  Psych- no agitation, oriented to person;   Skin: warm, dry, no cyanosis;   Pulses: 1-2+ Bilateral pedal, radial  Capillary: brisk; pale      DATA:  No results found for this or any previous visit.    05/02/24    ECHO (TTE) COMPLETE (PRN CONTRAST/BUBBLE/STRAIN/3D) 05/06/2024 10:52 AM (Final)    Interpretation Summary    Left Ventricle: Normal left ventricular systolic function with a visually estimated EF of 70 - 75%. Left ventricle size is normal. Normal wall thickness. Normal wall motion. Normal diastolic function.    Right Ventricle: Right  ventricle is moderately dilated. Normal systolic function.  Right Atrium: Right atrium is mildly dilated.    Pericardium: Moderate (1-2 cm) localized pericardial effusion present around the left ventricle. Pericardial posterior effusion measures 1.0 cm.    Image quality is adequate. Contrast used: Lumason . Technically difficult study, technically difficult study with poor endocardial visualization, technically difficult study due to patient's body habitus and technically difficult study due to patient's heart rhythm.    Signed by: Laquetta Commander, MD on 05/06/2024 10:52 AM       MAR reviewed and pertinent medications noted or modified as needed    MEDS:   Current Facility-Administered Medications   Medication Dose Route Frequency Provider Last Rate Last Admin    ipratropium 0.5 mg-albuterol  2.5 mg (DUONEB ) nebulizer solution 1 Dose  1 Dose Inhalation BID RT Aimee Elspeth SAUNDERS, MD   1 Dose at 06/14/24 9191    guaiFENesin  (MUCINEX ) extended release tablet 600 mg  600 mg Oral BID Ronn Dixon A, PA-C   600 mg at 06/14/24 0830    benzonatate  (TESSALON ) capsule 100 mg  100 mg Oral TID PRN Ronn Dixon A, PA-C   100 mg at 06/10/24 1217    doxycycline  (VIBRAMYCIN ) 100 mg in sodium chloride  0.9 % 100 mL IVPB (addEASE)  100 mg IntraVENous BID Minai, Omar A, MD 100 mL/hr at 06/14/24 0831 100 mg at 06/14/24 0831    influenza tiss-cult vaccine (FLUCELVAX) injection 0.5 mL  1 Dose IntraMUSCular Prior to discharge Aimee Elspeth SAUNDERS, MD        metoprolol  tartrate (LOPRESSOR ) tablet 50 mg  50 mg Oral BID Azeem, Ahad, MD   50 mg at 06/14/24 0830    OLANZapine  (ZYPREXA ) tablet 10 mg  10 mg Oral Nightly Punyala, Srinivasa R, MD   10 mg at 06/13/24 2055    atorvastatin  (LIPITOR ) tablet 40 mg  40 mg Oral Nightly Azeem, Ahad, MD   40 mg at 06/13/24 2055    haloperidol  lactate (HALDOL ) injection 5 mg  5 mg IntraVENous Q6H PRN Azeem, Ahad, MD   5 mg at 06/08/24 0438    budesonide  (PULMICORT ) nebulizer suspension 500 mcg  0.5 mg  Nebulization BID RT Nunna, Krishidhar R, MD   500 mcg at 06/14/24 9191    insulin  glargine (LANTUS ) injection vial 10 Units  10 Units SubCUTAneous Daily Nunna, Krishidhar R, MD   10 Units at 06/13/24 0912    ipratropium 0.5 mg-albuterol  2.5 mg (DUONEB ) nebulizer solution 1 Dose  1 Dose Inhalation Q4H PRN Nunna, Krishidhar R, MD   1 Dose at 06/08/24 0304    insulin  lispro (HUMALOG ,ADMELOG ) injection vial 0-8 Units  0-8 Units SubCUTAneous Q4H Nunna, Krishidhar R, MD   2 Units at 06/14/24 0043    0.9 % sodium chloride  infusion   IntraVENous PRN Nunna, Krishidhar R, MD        albuterol  (PROVENTIL ) nebulizer solution 2.5 mg  2.5 mg Nebulization Q4H PRN Nunna, Krishidhar R, MD        [Held by provider] tiotropium bromide  (SPIRIVA  RESPIMAT) 2.5 MCG/ACT inhaler 2 puff  2 puff Inhalation Daily RT Nunna, Krishidhar R, MD        [Held by provider] budesonide -formoterol  (SYMBICORT ) 160-4.5 MCG/ACT inhaler 2 puff  2 puff Inhalation BID RT Nunna, Krishidhar R, MD        hydrALAZINE  (APRESOLINE ) injection 10 mg  10 mg IntraVENous Q4H PRN Sahib, Taaliba A, APRN - NP   10 mg at 06/07/24 0202    glucose chewable tablet 16 g  4 tablet Oral PRN Rawland Anton  R, MD   16 g at 06/12/24 0245    dextrose  bolus 10% 125 mL  125 mL IntraVENous PRN Nunna, Krishidhar R, MD        Or    dextrose  bolus 10% 250 mL  250 mL IntraVENous PRN Rawland, Krishidhar R, MD        glucagon  injection 1 mg  1 mg SubCUTAneous PRN Nunna, Krishidhar R, MD        dextrose  10 % infusion   IntraVENous Continuous PRN Nunna, Krishidhar R, MD        sertraline  (ZOLOFT ) tablet 100 mg  100 mg Oral Daily Nunna, Krishidhar R, MD   100 mg at 06/14/24 0830    apixaban  (ELIQUIS ) tablet 5 mg  5 mg Per NG tube BID Nunna, Krishidhar R, MD   5 mg at 06/14/24 0830    sodium chloride  flush 0.9 % injection 5-40 mL  5-40 mL IntraVENous 2 times per day Marvis Lynwood HERO, APRN - CNP   10 mL at 06/14/24 0830    sodium chloride  flush 0.9 % injection 5-40 mL  5-40 mL IntraVENous PRN Marvis Lynwood HERO, APRN - CNP        0.9 % sodium chloride  infusion   IntraVENous PRN Marvis Lynwood HERO, APRN - CNP        lidocaine  1 % injection 50 mg  50 mg IntraDERmal Once Marvis Lynwood HERO, APRN - CNP        ALTEplase  (CATHFLO) injection 2 mg  2 mg IntraCATHeter PRN Marvis Lynwood HERO, APRN - CNP        sodium chloride  flush 0.9 % injection 5-40 mL  5-40 mL IntraVENous PRN Marvis Lynwood HERO, APRN - CNP        0.9 % sodium chloride  infusion   IntraVENous PRN Marvis Lynwood HERO, APRN - CNP   Stopped at 06/05/24 2344    potassium chloride  20 mEq/50 mL IVPB (Central Line)  20 mEq IntraVENous PRN Marvis Lynwood HERO, APRN - CNP        Or    potassium chloride  10 mEq/100 mL IVPB (Peripheral Line)  10 mEq IntraVENous PRN Marvis Lynwood HERO, APRN - CNP        magnesium  sulfate 2000 mg in 50 mL IVPB premix  2,000 mg IntraVENous PRN Marvis Lynwood HERO, APRN - CNP        ondansetron  (ZOFRAN -ODT) disintegrating tablet 4 mg  4 mg Oral Q8H PRN Marvis Lynwood HERO, APRN - CNP        Or    ondansetron  (ZOFRAN ) injection 4 mg  4 mg IntraVENous Q6H PRN Marvis Lynwood HERO, APRN - CNP   4 mg at 06/07/24 1021    polyethylene glycol (GLYCOLAX ) packet 17 g  17 g Oral Daily PRN Marvis Lynwood HERO, APRN - CNP        acetaminophen  (TYLENOL ) tablet 650 mg  650 mg Oral Q6H PRN Marvis Lynwood HERO, APRN - CNP   650 mg at 06/14/24 9568    Or    acetaminophen  (TYLENOL ) suppository 650 mg  650 mg Rectal Q6H PRN Marvis Lynwood HERO, APRN - CNP            ARTERIAL BLOOD GAS  No results for input(s): PHART, PCO2ART, PO2ART, HCO3ART, BEART, TCO2ARRT, HGBART, PO2CORART, FIO2A, O2SATART, OXYHEM, CARBOXHGBART, METHGBART, O2CONTART, PHCORART, TEMP in the last 72 hours.    Invalid input(s): PCO2COART    Labs:    Recent Labs     06/11/24  0901  06/12/24  0433   WBC 8.4 6.8   HGB 11.2* 11.8   PLT 146* 103*     Recent Labs     06/11/24  0901 06/12/24  0433   NA 142 140   K 3.6 3.7   CL 99 103   CO2 36* 26   GLUCOSE 114* 102*   BUN 20 17   CREATININE 0.68 0.69   CALCIUM  8.9 8.5*     No results  for input(s): CKTOTAL, CKMB, CKMBINDEX, TROPONINI in the last 72 hours.  Lab Results   Component Value Date/Time    PROBNP 11,914 06/03/2024 05:54 PM      Lab Results   Component Value Date/Time    TSH 2.400 06/09/2024 01:50 PM      Results       Procedure Component Value Units Date/Time    Culture, Blood 1 [[id number]] Collected: 06/09/24 1420    Order Status: Sent Specimen: Blood Updated: 06/09/24 1451    Legionella antigen, urine [[id number]] Collected: 06/04/24 1228    Order Status: Completed Specimen: Urine Updated: 06/06/24 1636     Sample Site Urine        L pneumophila S1 Ag, Ur Negative        Comment: Presumptive negative for L. pneumophila serogroup 1 antigen in urine,  suggesting no recent or current infection. Legionnaires' disease  cannot be ruled out since other serogroups and species may also  cause disease.  Performed At: [facility]  [address] [place], [place] [id number]  Jennette Shorter MD Ey:[phone]         S. pneumonia Antigen, Urine/CSF [[id number]] Collected: 06/04/24 1228    Order Status: Completed Specimen: Urine Updated: 06/06/24 1636     Sample Site Urine        Specimen Urine     S pneumoniae Ag, CSF Negative        FLUID CULTURE Not indicated.     Organism Id Not indicated.     Please note: Comment        Comment: College [employer] standards require a culture to be  performed on CSF specimens submitted for bacterial antigen testing.  (CAP [id number]) Urine specimens will not be cultured.  Performed At: [facility]  [address] [place], [place] [id number]  Jennette Shorter MD Ey:[phone]         COVID-19 & Influenza Combo [[id number]] Collected: 06/04/24 0845    Order Status: Completed Specimen: Nasopharynx Updated: 06/04/24 0934     SARS-CoV-2, PCR Not Detected        Comment: Not Detected results do not preclude SARS-CoV-2 infection and should not be used as the sole basis for patient management decisions. Results must be combined with  clinical observations, patient history, and epidemiological information        Rapid Influenza A By PCR Not Detected        Rapid Influenza B By PCR Not Detected        Comment:    Testing was performed using cobas Liat SARS-CoV-2 and Influenza A/B nucleic acid assay.  This test is a multiplex Real-Time Reverse Transcriptas         Culture, Blood 2 [[id number]]  (Abnormal) Collected: 06/04/24 0300    Order Status: Completed Specimen: Blood Updated: 06/06/24 0836     Special Requests No Special Requests        Culture       Staphylococcus species, coagulase negative growing in 1 of 2 bottles drawn  No Site Indicated                  This isolate represents a common contaminant and may or may not be clinically significant. Please notify the laboratory if further work is indicated.            (NOTE) CALLED TO ;CAMBPELL IRBY [email or web address]    Culture, Blood, PCR ID Panel [[id number]]  (Abnormal) Collected: 06/04/24 0300    Order Status: Completed Specimen: Blood Updated: 06/05/24 1622     Accession Number Blood        Enterococcus faecalis by PCR Not detected     Enterococcus faecium by PCR Not detected     Listeria monocytogenes by PCR Not detected     STAPHYLOCOCCUS Detected        Staphylococcus Aureus Not detected     Staphylococcus epidermidis by PCR Detected        Staphylococcus lugdunensis by PCR Not detected     STREPTOCOCCUS Not detected     Streptococcus agalactiae (Group B) Not detected     Strep pneumoniae Not detected     Strep pyogenes,(Grp. A) Not detected     Acinetobacter calcoac baumannii complex by PCR Not detected     Bacteroides fragilis by PCR Not detected     Enterobacteriaceae by PCR Not detected     Enterobacter cloacae complex by PCR Not detected     Escherichia Coli Not detected     Klebsiella aerogenes by PCR Not detected     Klebsiella oxytoca by PCR Not detected     Klebsiella pneumoniae group by PCR Not detected     Proteus by PCR Not detected     Salmonella species by PCR Not detected      Serratia marcescens by PCR Not detected     Haemophilus Influenzae by PCR Not detected     Neisseria meningitidis by PCR Not detected     Pseudomonas aeruginosa Not detected     Stenotrophomonas maltophilia by PCR Not detected     Candida albicans by PCR Not detected     Candida auris by PCR Not detected     Candida glabrata Not detected     Candida krusei by PCR Not detected     Candida parapsilosis by PCR Not detected     Candida tropicalis by PCR Not detected     Cryptococcus neoformans/gattii by PCR Not detected     Resistant gene targets          Methicillin Resistance mecA/C  by PCR Detected        [employer] test comment False positive results may rarely occur. Correlate with     Comment: clinical,epidemiologic,  and other laboratory findings  Please see BCID Interpretation Guide in EPIC Links         MRSA by PCR [[id number]] Collected: 06/04/24 0215    Order Status: Completed Specimen: Swab Updated: 06/04/24 0443     MRSA by PCR Not Detected       Culture, C Auris Screen [[id number]] Collected: 06/04/24 0215    Order Status: Completed Specimen: Swab Updated: 06/07/24 1449     Special Requests No Special Requests        Culture No Candida auris present       Culture, Blood 1 [[id number]] Collected: 06/04/24 0124    Order Status: Completed Specimen: Blood Updated: 06/10/24 1431     Special Requests No Special Requests        Culture No growth  6 days       Culture, Urine [[id number]] Collected: 06/03/24 2204    Order Status: Completed Specimen: Urine Updated: 06/05/24 0811     Special Requests --        No Special Requests  Reflexed from [id number]       Culture No Growth (<1000 cfu/mL)              Imaging:  XR CHEST PORTABLE   Final Result      Persistent but improved diffuse bilateral pulmonary infiltrates.         Electronically signed by DARICE COLON      CTA HEAD NECK W CONTRAST   Final Result         1. No acute large vessel arterial occlusion. Mild to moderate atherosclerotic   changes.   2. Pulmonary  hypertension.   3. Biapical airspace disease likely infectious/inflammatory.               Electronically signed by Aletha CHRISTELLA Ham      CT BRAIN PERFUSION   Final Result         1. No acute large vessel arterial occlusion. Mild to moderate atherosclerotic   changes.   2. Pulmonary hypertension.   3. Biapical airspace disease likely infectious/inflammatory.               Electronically signed by Aletha CHRISTELLA Ham      CT HEAD WO CONTRAST   Final Result   No acute intracranial findings.         Electronically signed by PAULINE BLANCH      XR CHEST PORTABLE   Final Result   1. No change bilateral pneumonia         Electronically signed by Evalene JINNY Hoit      CT HEAD WO CONTRAST   Final Result      No acute intracranial abnormality.         Electronically signed by Toribio JINNY Musick      CTA CHEST W WO CONTRAST   Final Result   Extensive bilateral pulmonary infiltrates. No evidence of pulmonary embolism.         Electronically signed by DARICE COLON      XR CHEST PORTABLE   Final Result   1. Enlarged cardiac silhouette, worsening airspace disease or pulmonary edema.   No complicating features post intubation            Electronically signed by Susie Brain      XR CHEST PORTABLE   Final Result   Cardiomegaly and mild interstitial pulmonary edema.             Electronically signed by JIMMY HABIB          No results found.               "

## 2024-06-14 NOTE — Care Coordination (Signed)
"  Patient with a discharge order present to go home.  Patient with no accepting home health company at this time.  CM called patients room at extension [phone].  CM was able to speak with patient.  CM notified her of the right to appeal the discharge from the hospital,  she is requesting the form to be emailed to her to review before making a decision on discharging today.  CM emailed patient IMM information.  CM notified primary nurse.   "

## 2024-06-14 NOTE — Plan of Care (Signed)
"    Problem: Discharge Planning  Goal: Discharge to home or other facility with appropriate resources  06/14/2024 0357 by Evvie Behrmann, RN  Outcome: Progressing  Flowsheets (Taken 06/14/2024 [phone])  Discharge to home or other facility with appropriate resources:   Identify barriers to discharge with patient and caregiver   Arrange for needed discharge resources and transportation as appropriate     "

## 2024-06-14 NOTE — Plan of Care (Signed)
"    Problem: Chronic Conditions and Co-morbidities  Goal: Patient's chronic conditions and co-morbidity symptoms are monitored and maintained or improved  06/14/2024 0833 by Okey Sor, RN  Outcome: Progressing  06/14/2024 0357 by Exie Round, RN  Outcome: Progressing     Problem: Discharge Planning  Goal: Discharge to home or other facility with appropriate resources  06/14/2024 0833 by Okey Sor, RN  Outcome: Progressing  06/14/2024 0357 by Exie Round, RN  Outcome: Progressing  Flowsheets (Taken 06/14/2024 [phone])  Discharge to home or other facility with appropriate resources:   Identify barriers to discharge with patient and caregiver   Arrange for needed discharge resources and transportation as appropriate     Problem: Neurosensory - Adult  Goal: Achieves stable or improved neurological status  06/14/2024 0833 by Okey Sor, RN  Outcome: Progressing  06/14/2024 0357 by Exie Round, RN  Outcome: Progressing  Goal: Absence of seizures  06/14/2024 0833 by Okey Sor, RN  Outcome: Progressing  06/14/2024 0357 by Exie Round, RN  Outcome: Progressing  Goal: Achieves maximal functionality and self care  06/14/2024 0833 by Okey Sor, RN  Outcome: Progressing  06/14/2024 0357 by Exie Round, RN  Outcome: Progressing     Problem: Respiratory - Adult  Goal: Achieves optimal ventilation and oxygenation  06/14/2024 0833 by Okey Sor, RN  Outcome: Progressing  06/14/2024 0357 by Exie Round, RN  Outcome: Progressing     Problem: Infection - Adult  Goal: Absence of infection at discharge  06/14/2024 0833 by Okey Sor, RN  Outcome: Progressing  06/14/2024 0357 by Exie Round, RN  Outcome: Progressing  Goal: Absence of infection during hospitalization  06/14/2024 0833 by Okey Sor, RN  Outcome: Progressing  06/14/2024 0357 by Exie Round, RN  Outcome: Progressing  Goal: Absence of fever/infection during anticipated neutropenic period  06/14/2024 0833 by Okey Sor, RN  Outcome:  Progressing  06/14/2024 0357 by Exie Round, RN  Outcome: Progressing     Problem: Genitourinary - Adult  Goal: Absence of urinary retention  06/14/2024 0833 by Okey Sor, RN  Outcome: Progressing  06/14/2024 0357 by Exie Round, RN  Outcome: Progressing  Goal: Urinary catheter remains patent  06/14/2024 0833 by Okey Sor, RN  Outcome: Progressing  06/14/2024 0357 by Exie Round, RN  Outcome: Progressing     Problem: Metabolic/Fluid and Electrolytes - Adult  Goal: Electrolytes maintained within normal limits  06/14/2024 0833 by Okey Sor, RN  Outcome: Progressing  06/14/2024 0357 by Exie Round, RN  Outcome: Progressing  Goal: Hemodynamic stability and optimal renal function maintained  06/14/2024 0833 by Okey Sor, RN  Outcome: Progressing  06/14/2024 0357 by Exie Round, RN  Outcome: Progressing  Goal: Glucose maintained within prescribed range  06/14/2024 0833 by Okey Sor, RN  Outcome: Progressing  06/14/2024 0357 by Exie Round, RN  Outcome: Progressing     "

## 2024-06-14 NOTE — Discharge Summary (Signed)
 "                                       Discharge Summary    Name: Breanna Lester  [id number]  Date of Birth: [birth year] (Age: 63 y.o.)   Date of Admission: 06/03/2024  Date of Discharge: 06/14/2024  Attending Physician: Aimee Elspeth SAUNDERS, MD    Discharge Diagnosis:   Principal Problem:    Acute respiratory failure [facility])  Resolved Problems:    * No resolved hospital problems. *  Acute hypoxic respiratory failure, resolved  Hypercapnic respiratory failure  Severe bilateral CAP  COPD  Concern for bacteremia, ruled out  Acute metabolic encephalopathy, resolved  Polysubstance abuse  A-fib  Microcytic anemia  Thrombocytopenia, mild  OSA    Consultations:  IP CONSULT TO PHARMACY  IP CONSULT TO VASCULAR ACCESS TEAM  IP CONSULT TO CASE MANAGEMENT  IP CONSULT TO TELE-NEUROLOGY  IP CONSULT TO VASCULAR ACCESS TEAM  IP CONSULT TO PSYCHIATRY  IP CONSULT TO DIETITIAN  IP CONSULT TO PULMONOLOGY  IP CONSULT TO PULMONOLOGY  IP CONSULT TO CASE MANAGEMENT  IP CONSULT TO CASE MANAGEMENT      Brief Admission History/Reason for Admission Per Rutul DELENA Fairly, MD:   Acute hypoxic respiratory failure    Brief Hospital Course by Main Problems:   Breanna Lester is a 63 y.o.  female with PMHx significant for chronic hypoxic respiratory failure on 3 L nasal cannula secondary to COPD, anxiety, depression, HTN, A-fib on Eliquis , T2DM, pancreatitis and EtOH cirrhosis who presented to ED on 06/03/2024 for worsening dyspnea.  In ED, patient severely hypoxic with increased work of breathing which was refractory to BiPAP, requiring intubation.  Admitted to ICU.  CTA chest showed extensive bilateral pulmonary infiltrates concerning for severe bilateral pneumonia, however negative for PE.  Started on empiric IV ceftriaxone  and azithromycin .  Expanded pneumonia workup negative for Legionella and S pneumo.  Initial blood culture showing Staph epidermidis, likely contaminant and repeat blood culture with no growth (confirmed with the lab on day of discharge).   Respiratory status improved and patient was able to be extubated to BiPAP and further decreased to home dose 3 L NC.  Completed course of ceftriaxone , azithromycin , and doxycycline .  However, patient remained confused so psychiatry was consulted.  Started on Zoloft  and Zyprexa .  Stroke alert was called on 10/17 for left-sided weakness with complete left hemianopia.  CT head, CTA head/neck and CT brain perfusion with no acute findings.  Patient unable to tolerate MRI.  Neurology evaluated, consider EEG if clinical status worsening otherwise no further stroke work up.  Transferred to medical service on 10/20.  Pt with increased work of breathing and lethargy on 10/21 with repeat ABG showing worsening hypercapnia of 60.  Started on nightly BiPAP with significant improvement in breathing and normalization of mental status.  Now stable on home dose 3 L nasal cannula.  Pulmonology consulted, recommends Trilogy NIV at discharge.  PT/OT recommending SNF.  Trilogy delivered 10/24 and patient wore overnight, tolerated well.    Pulmonology has cleared patient for discharge.  Recommend patient get repeat chest CT or CXR in 4 to 6 weeks to ensure resolution of pneumonia. Advised patient to follow-up with PCP in 10 to 14 days with recent admission for continued monitoring and management of medications and other comorbidities.  Discussed discharge plan with patient, which patient agreed to.  All questions answered  and patient will be discharged pending HH set up.    Discharge Exam:  Patient seen and examined by me on discharge day.  No new complaints.  Stable on home dose 3 L O2.  Wore Trilogy overnight and tolerated well.  Denies shortness of breath, chest pain or cough.    Pertinent Findings:  Patient Vitals for the past 24 hrs:   BP Temp Temp src Pulse Resp SpO2 Weight   06/14/24 0600 -- -- -- -- -- -- 114.5 kg (252 lb 6.8 oz)   06/14/24 0528 (!) 129/58 97.7 F (36.5 C) Oral 59 19 100 % --   06/14/24 0431 (!) 107/50 97.5 F  (36.4 C) Oral 69 18 93 % --   06/14/24 0410 -- -- -- 65 15 96 % --   06/14/24 0012 -- -- -- 66 23 98 % --   06/13/24 2111 (!) 129/55 -- -- 56 -- -- --   06/13/24 1934 -- -- -- 59 19 97 % --   06/13/24 1930 126/64 98.1 F (36.7 C) Oral 60 18 -- --   06/13/24 1516 122/81 99.5 F (37.5 C) Oral 57 18 96 % --   06/13/24 0825 (!) 113/92 98.4 F (36.9 C) Oral 63 18 98 % --       Gen:    Not in distress  Chest: Clear lungs, stable on home dose 3 L NC  CVS:   Regular rhythm.  No edema  Abd:  Soft, not distended, not tender  Neuro: At baseline, alert and oriented x 3    Discharge/Recent Laboratory Results:  Recent Labs     06/12/24  0433   NA 140   K 3.7   CL 103   CO2 26   BUN 17   CREATININE 0.69   GLUCOSE 102*   CALCIUM  8.5*     Recent Labs     06/12/24  0433   HGB 11.8   HCT 41.7   WBC 6.8   PLT 103*       Discharge Medications:     Medication List        START taking these medications      apixaban  5 MG Tabs tablet  Commonly known as: ELIQUIS   Take 1 tablet by mouth 2 times daily     atorvastatin  40 MG tablet  Commonly known as: LIPITOR   Take 1 tablet by mouth nightly     bumetanide  1 MG tablet  Commonly known as: BUMEX   Take 1 tablet by mouth daily     levoFLOXacin  750 MG tablet  Commonly known as: LEVAQUIN   Take 1 tablet by mouth daily for 5 days     metFORMIN  500 MG extended release tablet  Commonly known as: GLUCOPHAGE -XR     OLANZapine  10 MG tablet  Commonly known as: ZYPREXA   Take 1 tablet by mouth nightly            CHANGE how you take these medications      albuterol  sulfate HFA 108 (90 Base) MCG/ACT inhaler  Commonly known as: Ventolin  HFA  Inhale 2 puffs into the lungs 4 times daily as needed for Wheezing  What changed: Another medication with the same name was removed. Continue taking this medication, and follow the directions you see here.     metoprolol  tartrate 50 MG tablet  Commonly known as: LOPRESSOR   Take 1 tablet by mouth 2 times daily  What changed:   medication strength  how much to take  CONTINUE taking these medications      acetaminophen  325 MG tablet  Commonly known as: TYLENOL   Take 1 tablet by mouth every 6 hours as needed for Pain     Blood Glucose Monitor System w/Device Kit  Pharmacist to identify preferred meter and strips.     blood glucose test strips  Test 1-2 times a day & as needed for symptoms of irregular blood glucose. Dispense sufficient amount for indicated testing frequency plus additional to accommodate PRN testing needs. Pharmacist to identify preferred brand.     chlorthalidone  25 MG tablet  Commonly known as: HYGROTON      guaiFENesin  600 MG extended release tablet  Commonly known as: MUCINEX   Take 1 tablet by mouth 2 times daily     lactobacillus capsule  Take 1 capsule by mouth daily (with breakfast)     Lancets 30G Misc  Test 1-2 times a day & as needed for symptoms of irregular blood glucose. Dispense sufficient amount for indicated testing frequency plus additional to accommodate PRN testing needs. Pharmacist to identify preferred brand.     Melatonin 10 MG Tabs     pantoprazole  40 MG tablet  Commonly known as: PROTONIX   Take 1 tablet by mouth 2 times daily (before meals)     sertraline  100 MG tablet  Commonly known as: ZOLOFT   Take 1 tablet by mouth daily     Trelegy Ellipta  100-62.5-25 MCG/ACT Aepb inhaler  Generic drug: fluticasone -umeclidin-vilant  Inhale 1 puff into the lungs daily            STOP taking these medications      escitalopram  5 MG tablet  Commonly known as: LEXAPRO             ASK your doctor about these medications      ondansetron  4 MG disintegrating tablet  Commonly known as: ZOFRAN -ODT  Take 1 tablet by mouth every 8 hours as needed for Nausea or Vomiting               Where to Get Your Medications        These medications were sent to [facility] [place] [place] - [address] - P [phone] [address] [phone]  [address] [place], [place] [address]      Phone: [phone]   atorvastatin  40 MG tablet  bumetanide  1 MG  tablet  levoFLOXacin  750 MG tablet  metoprolol  tartrate 50 MG tablet  OLANZapine  10 MG tablet  Trelegy Ellipta  100-62.5-25 MCG/ACT Aepb inhaler             DISPOSITION:    Home with Family:        Home with HH/PT/OT/RN:                x   SNF/LTC:    [facility]:    OTHER:            Code status:   Recommended diet: diabetic diet  Recommended activity: activity as tolerated  Wound care: None      Follow up with:   PCP : Unknown, Provider  Rubin Sprague, MD  [address]  [place] [address]  [phone]    Schedule an appointment as soon as possible for a visit in 1 week(s)  Follow-up with PCP in 10 to 14 days given recent hospitalization for continued monitoring and management of medications and other comorbidities.  Obtain repeat chest x-ray in 4 to 6 weeks to ensure resolution of pneumonia.  Mauricio Mancel LABOR, MD  [address] [place] [address]  [phone]    Schedule an appointment as soon as possible for a visit in 1 week(s)  Follow-up with pulmonologist in 1 to 2 weeks for continued monitoring and management of COPD requiring O2 and BiPAP    Punyala, Srinivasa R, MD  [address]  [place] [address]  [phone]    Schedule an appointment as soon as possible for a visit in 1 week(s)  Follow-up with psychiatry in 1 to 2 weeks for continued medication management          Total time in minutes spent coordinating this discharge (includes going over instructions, follow-up, prescriptions, and preparing report for sign off to her PCP) :  35 minutes   "

## 2024-06-15 LAB — POCT GLUCOSE
POC Glucose: 106 mg/dL — ABNORMAL HIGH (ref 65–100)
POC Glucose: 111 mg/dL — ABNORMAL HIGH (ref 65–100)
POC Glucose: 163 mg/dL — ABNORMAL HIGH (ref 65–100)
POC Glucose: 176 mg/dL — ABNORMAL HIGH (ref 65–100)
POC Glucose: 75 mg/dL (ref 65–100)
POC Glucose: 77 mg/dL (ref 65–100)

## 2024-06-15 LAB — EKG 12-LEAD
Atrial Rate: 66 {beats}/min
Diagnosis: NORMAL
P Axis: 68 degrees
P-R Interval: 148 ms
Q-T Interval: 480 ms
QRS Duration: 90 ms
QTc Calculation (Bazett): 503 ms
R Axis: 61 degrees
T Axis: 49 degrees
Ventricular Rate: 66 {beats}/min

## 2024-06-15 LAB — BASIC METABOLIC PANEL
Anion Gap: 8 mmol/L (ref 2–14)
BUN/Creatinine Ratio: 19 (ref 12–20)
BUN: 12 mg/dL (ref 8–23)
CO2: 30 mmol/L — ABNORMAL HIGH (ref 20–29)
Calcium: 8.5 mg/dL — ABNORMAL LOW (ref 8.8–10.2)
Chloride: 106 mmol/L (ref 98–107)
Creatinine: 0.62 mg/dL (ref 0.60–1.00)
Est, Glom Filt Rate: >90 ml/min/1.73m2 (ref >59)
Glucose: 113 mg/dL — ABNORMAL HIGH (ref 65–100)
Potassium: 3.3 mmol/L — ABNORMAL LOW (ref 3.5–5.1)
Sodium: 143 mmol/L (ref 136–145)

## 2024-06-15 LAB — TROPONIN: Troponin T: 15.8 ng/L — ABNORMAL HIGH (ref 0–14)

## 2024-06-15 LAB — CULTURE, BLOOD 1: Culture: NO GROWTH

## 2024-06-15 MED ORDER — MECLIZINE HCL 12.5 MG PO TABS
12.5 | Freq: Three times a day (TID) | ORAL | Status: DC | PRN
Start: 2024-06-15 — End: 2024-06-16

## 2024-06-15 MED ORDER — MECLIZINE HCL 12.5 MG PO TABS
12.5 | ORAL_TABLET | Freq: Three times a day (TID) | ORAL | 0 refills | 10.00000 days | Status: AC | PRN
Start: 2024-06-15 — End: 2024-06-20

## 2024-06-15 MED ORDER — POTASSIUM CHLORIDE CRYS ER 20 MEQ PO TBCR
20 | Freq: Once | ORAL | Status: AC
Start: 2024-06-15 — End: 2024-06-15
  Administered 2024-06-15: 13:00:00 40 meq via ORAL

## 2024-06-15 MED FILL — BUDESONIDE 0.5 MG/2ML IN SUSP: 0.5 MG/2ML | RESPIRATORY_TRACT | Qty: 4 | Fill #0

## 2024-06-15 MED FILL — ELIQUIS 5 MG PO TABS: 5 mg | ORAL | Qty: 1 | Fill #0

## 2024-06-15 MED FILL — POTASSIUM CHLORIDE CRYS ER 20 MEQ PO TBCR: 20 meq | ORAL | Qty: 2 | Fill #0

## 2024-06-15 MED FILL — MUCUS RELIEF ER 600 MG PO TB12: 600 mg | ORAL | Qty: 1 | Fill #0

## 2024-06-15 MED FILL — DOXYCYCLINE HYCLATE 100 MG IV SOLR: 100 mg | INTRAVENOUS | Qty: 100 | Fill #0

## 2024-06-15 MED FILL — IPRATROPIUM-ALBUTEROL 0.5-2.5 (3) MG/3ML IN SOLN: 0.5-2.5 (3) MG/3ML | RESPIRATORY_TRACT | Qty: 3 | Fill #0

## 2024-06-15 MED FILL — OLANZAPINE 10 MG PO TABS: 10 mg | ORAL | Qty: 1 | Fill #0

## 2024-06-15 MED FILL — ATORVASTATIN CALCIUM 40 MG PO TABS: 40 mg | ORAL | Qty: 1 | Fill #0

## 2024-06-15 MED FILL — SERTRALINE HCL 50 MG PO TABS: 50 mg | ORAL | Qty: 2 | Fill #0

## 2024-06-15 MED FILL — TYLENOL 325 MG PO TABS: 325 mg | ORAL | Qty: 2 | Fill #0

## 2024-06-15 MED FILL — BUDESONIDE 0.5 MG/2ML IN SUSP: 0.5 MG/2ML | RESPIRATORY_TRACT | Qty: 2 | Fill #0

## 2024-06-15 MED FILL — METOPROLOL TARTRATE 50 MG PO TABS: 50 mg | ORAL | Qty: 1 | Fill #0

## 2024-06-15 NOTE — Progress Notes (Signed)
 "  IMPRESSION:   Acute on chronic hypoxic respiratory failure  Chronic hypercapnic respiratory failure  Bilateral pneumonia improved  Cirrhosis of liver  Chronic A-fib      RECOMMENDATIONS/PLAN:   63 year old obese lady came in because of shortness of breath and dyspnea she is chronically on home oxygen she did not have any sleep study done in the past she has history of hypertension chronic A-fib history of EtOH cirrhosis of liver, patient also has COPD she was severely hypoxic and she was put on noninvasive ventilator CAT scan of the chest was done which showed bilateral pneumonia patient was intubated and successfully weaned off and she is using BiPAP at night and oxygen during the daytime  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 CT head was negative  Continue use antibiotic for bilateral pneumonia  Nocturnal CPAP she never had any sleep study done  Prescribe albuterol  inhaler for at home use  Patient has had worsening respiratory failure and is at risk for serious harm or death without appropriate therapy at home which include noninvasive ventilator while patient has an underlying condition of OSA it is not the primary indication for need for NIV patient has previously tried CPAP and BiPAP and has failed to improve his hypercapnia and saturation level.  Since chronic/acute respiratory failure can be prevented with the use of positive pressure ventilation and due to the nature of the patient deteriorating condition a portable volume ventilator has been ordered via mask interface for use at night as well as during the day.  The patient requires continuous access to noninvasive positive pressure ventilation therapy to reduce respiratory distress, minimize hospitalization and to control carbon dioxide retention.  Noninvasive ventilator setting BiPAP at an IPAP of 15-20 cm H2O and EPAP of 3-5 cm H2O, gradually increasing pressures to target an oxygen saturation (SpO2) of 88-92% and improved pH  and PaCO2. Higher IPAP levels (up to 25-30 cm H2O) and higher EPAP may be needed for severe hypercapnia    Continue with Bumex   Overnight pulse oximetry shows desaturation for 24 minutes qualify for home oxygen she is on noninvasive ventilator using it every night and daytime as needed     10/22  Alert awake eating breakfast on oxygen via nasal cannula getting nebulizer treatment use the noninvasive ventilator BiPAP her pCO2 was 60 discussed with her about getting noninvasive ventilator will order one but issue is the compliance, she had a history of polysubstance abuse and will do 6-minute walk test before discharge to see if she qualifies for POC.  10/23  Patient seen and examined at the bedside getting nebulizer treatment RT was present in the room denies any chest pain shortness of breath awaiting for noninvasive ventilator which has been ordered discussed with her about her condition and using inhalers and compliance with the noninvasive ventilator once patient get the noninvasive ventilator she can be discharged patient getting nebulizer treatment also on doxycycline .  Culture positive most likely contamination continue with the Bumex .  Recommend abstinence from substance abuse.  10/24   Patient was alert and awake at room air. She is tolerating a full diet. She denies any chest pain, shortness of breath. She is awaiting her noninvasive ventilator which has been ordered. Discussed inhaler use and compliance with the ventilator upon discharge. Continue with the bumex . Recommend abstinence from substance abuse.  Okay to discharge  10/25  Patient was alert and awake using her mask this morning. She says that her NIV machine has helped  a lot. She denies any chest pain or shortness of breath today. She needs a prescription for her albuterol  rescue inhaler upon discharge. Continue with bumex . Recommend abstinence from substance abuse. Okay to discharge.  Asking for albuterol  inhaler as an outpatient.  10/26  She was  sleeping comfortably.   [x]  High complexity decision making was performed  [x]  See my orders for details    PMH:  has a past medical history of Anxiety, Depression, Gastrointestinal disorder, Heart failure (HCC), Hyperlipidemia, Hypertension, Liver disease, and Pneumonia.    PSH:   has a past surgical history that includes Cataract removal; egd transoral biopsy single/multiple (05/17/2009); Dilation and curettage of uterus (1999); Hysterectomy (2008); Cesarean section (1986 and 1993); Adenoidectomy (1972); Tonsillectomy (1972); back surgery; and Upper gastrointestinal endoscopy (N/A, 04/17/2024).     FHX: family history includes Cancer in her mother; Heart Disease in her father.     SHX:  reports that she quit smoking about 16 years ago. Her smoking use included cigarettes. She has never used smokeless tobacco. She reports that she does not drink alcohol and does not use drugs.    ALL:   Allergies   Allergen Reactions    Latex Rash        MEDS:   [x]  Reviewed - As Below   []  Not reviewed    Current Facility-Administered Medications   Medication Dose Route Frequency Provider Last Rate Last Admin    meclizine  (ANTIVERT ) tablet 12.5 mg  12.5 mg Oral TID PRN Gillard, Madison A, PA-C        ipratropium 0.5 mg-albuterol  2.5 mg (DUONEB ) nebulizer solution 1 Dose  1 Dose Inhalation BID RT Aimee Elspeth SAUNDERS, MD   1 Dose at 06/14/24 2156    guaiFENesin  (MUCINEX ) extended release tablet 600 mg  600 mg Oral BID Gillard, Madison A, PA-C   600 mg at 06/15/24 0833    benzonatate  (TESSALON ) capsule 100 mg  100 mg Oral TID PRN Ronn Dixon A, PA-C   100 mg at 06/10/24 1217    influenza tiss-cult vaccine (FLUCELVAX) injection 0.5 mL  1 Dose IntraMUSCular Prior to discharge Aimee Elspeth SAUNDERS, MD        metoprolol  tartrate (LOPRESSOR ) tablet 50 mg  50 mg Oral BID Azeem, Ahad, MD   50 mg at 06/15/24 9166    OLANZapine  (ZYPREXA ) tablet 10 mg  10 mg Oral Nightly Punyala, Srinivasa R, MD   10 mg at 06/14/24 2318    atorvastatin  (LIPITOR )  tablet 40 mg  40 mg Oral Nightly Azeem, Ahad, MD   40 mg at 06/14/24 2318    haloperidol  lactate (HALDOL ) injection 5 mg  5 mg IntraVENous Q6H PRN Azeem, Ahad, MD   5 mg at 06/08/24 0438    budesonide  (PULMICORT ) nebulizer suspension 500 mcg  0.5 mg Nebulization BID RT Nunna, Krishidhar R, MD   500 mcg at 06/14/24 2156    insulin  glargine (LANTUS ) injection vial 10 Units  10 Units SubCUTAneous Daily Nunna, Krishidhar R, MD   10 Units at 06/13/24 0912    ipratropium 0.5 mg-albuterol  2.5 mg (DUONEB ) nebulizer solution 1 Dose  1 Dose Inhalation Q4H PRN Nunna, Krishidhar R, MD   1 Dose at 06/08/24 0304    insulin  lispro (HUMALOG ,ADMELOG ) injection vial 0-8 Units  0-8 Units SubCUTAneous Q4H Nunna, Krishidhar R, MD   2 Units at 06/14/24 0043    0.9 % sodium chloride  infusion   IntraVENous PRN Nunna, Krishidhar R, MD  albuterol  (PROVENTIL ) nebulizer solution 2.5 mg  2.5 mg Nebulization Q4H PRN Nunna, Krishidhar R, MD        [Held by provider] tiotropium bromide  (SPIRIVA  RESPIMAT) 2.5 MCG/ACT inhaler 2 puff  2 puff Inhalation Daily RT Nunna, Krishidhar R, MD        [Held by provider] budesonide -formoterol  (SYMBICORT ) 160-4.5 MCG/ACT inhaler 2 puff  2 puff Inhalation BID RT Nunna, Krishidhar R, MD        hydrALAZINE  (APRESOLINE ) injection 10 mg  10 mg IntraVENous Q4H PRN Sahib, Taaliba A, APRN - NP   10 mg at 06/07/24 0202    glucose chewable tablet 16 g  4 tablet Oral PRN Nunna, Krishidhar R, MD   16 g at 06/12/24 0245    dextrose  bolus 10% 125 mL  125 mL IntraVENous PRN Nunna, Krishidhar R, MD        Or    dextrose  bolus 10% 250 mL  250 mL IntraVENous PRN Nunna, Krishidhar R, MD        glucagon  injection 1 mg  1 mg SubCUTAneous PRN Nunna, Krishidhar R, MD        dextrose  10 % infusion   IntraVENous Continuous PRN Nunna, Krishidhar R, MD        sertraline  (ZOLOFT ) tablet 100 mg  100 mg Oral Daily Nunna, Krishidhar R, MD   100 mg at 06/15/24 9166    apixaban  (ELIQUIS ) tablet 5 mg  5 mg Per NG tube BID Nunna, Krishidhar  R, MD   5 mg at 06/15/24 9166    sodium chloride  flush 0.9 % injection 5-40 mL  5-40 mL IntraVENous 2 times per day Marvis Lynwood HERO, APRN - CNP   10 mL at 06/15/24 9166    sodium chloride  flush 0.9 % injection 5-40 mL  5-40 mL IntraVENous PRN Marvis Lynwood HERO, APRN - CNP        0.9 % sodium chloride  infusion   IntraVENous PRN Marvis Lynwood HERO, APRN - CNP        lidocaine  1 % injection 50 mg  50 mg IntraDERmal Once Marvis Lynwood HERO, APRN - CNP        ALTEplase  (CATHFLO) injection 2 mg  2 mg IntraCATHeter PRN Marvis Lynwood HERO, APRN - CNP        sodium chloride  flush 0.9 % injection 5-40 mL  5-40 mL IntraVENous PRN Marvis Lynwood HERO, APRN - CNP        0.9 % sodium chloride  infusion   IntraVENous PRN Marvis Lynwood HERO, APRN - CNP   Stopped at 06/05/24 2344    potassium chloride  20 mEq/50 mL IVPB (Central Line)  20 mEq IntraVENous PRN Marvis Lynwood HERO, APRN - CNP        Or    potassium chloride  10 mEq/100 mL IVPB (Peripheral Line)  10 mEq IntraVENous PRN Marvis Lynwood HERO, APRN - CNP        magnesium  sulfate 2000 mg in 50 mL IVPB premix  2,000 mg IntraVENous PRN Marvis Lynwood HERO, APRN - CNP        ondansetron  (ZOFRAN -ODT) disintegrating tablet 4 mg  4 mg Oral Q8H PRN Marvis Lynwood HERO, APRN - CNP        Or    ondansetron  (ZOFRAN ) injection 4 mg  4 mg IntraVENous Q6H PRN Marvis Lynwood HERO, APRN - CNP   4 mg at 06/07/24 1021    polyethylene glycol (GLYCOLAX ) packet 17 g  17 g Oral Daily PRN Marvis Lynwood HERO, APRN -  CNP        acetaminophen  (TYLENOL ) tablet 650 mg  650 mg Oral Q6H PRN Marvis Lynwood HERO, APRN - CNP   650 mg at 06/14/24 2317    Or    acetaminophen  (TYLENOL ) suppository 650 mg  650 mg Rectal Q6H PRN Marvis Lynwood HERO, APRN - CNP            MAR reviewed and pertinent medications noted or modified as needed      MND:Ezmupwzwu items are noted in HPI.      Hemodynamics:    CO:    CI:    CVP:    SVR:   PAP Systolic:    PAP Diastolic:    PVR:    SV02:        Ventilator Settings:      Mode Rate TV Press PEEP FiO2 PIP Min. Vent   (S) CPAP/PS 16 bpm  380 mL    6 45 %   13 cmH2O           Vital Signs: Telemetry:    AFIB Intake/Output:   BP (!) 110/39   Pulse 65   Temp 97.8 F (36.6 C) (Oral)   Resp 18   Ht 1.575 m (5' 2)   Wt 115 kg (253 lb 8.5 oz)   SpO2 97%   BMI 46.36 kg/m     Temp (24hrs), Avg:97.7 F (36.5 C), Min:97.6 F (36.4 C), Max:97.8 F (36.6 C)        O2 Device: Nasal cannula O2 Flow Rate (L/min): 3 L/min       Wt Readings from Last 4 Encounters:   06/15/24 115 kg (253 lb 8.5 oz)   06/06/24 126.6 kg (279 lb 1.6 oz)   05/03/24 129.7 kg (286 lb)   05/05/24 129.7 kg (286 lb)          Intake/Output Summary (Last 24 hours) at 06/15/2024 0915  Last data filed at 06/14/2024 2307  Gross per 24 hour   Intake 350.56 ml   Output --   Net 350.56 ml       Last shift:      No intake/output data recorded.  Last 3 shifts: 10/24 1901 - 10/26 0700  In: 900.6 [P.O.:500]  Out: -        Physical Exam:     General: Caucasian female; alert awake on oxygen via nasal cannula  HEENT: NCAT, poor dentition, lips and mucosa dry  Eyes: anicteric; conjunctiva clear  Neck: no nodes, trach midline; no accessory MM use.  Chest: no deformity,  Cardiac: IR regular; no murmur;   Lungs: distant breath sounds; bilateral rales  Abd: soft, NT, hypoactive BS  Ext: Bilateral leg edema; no joint swelling; No clubbing  GU: NO foley, clear urine  Neuro: No focal deficit  Psych- no agitation, oriented to person;   Skin: warm, dry, no cyanosis;   Pulses: 1-2+ Bilateral pedal, radial  Capillary: brisk; pale      DATA:  No results found for this or any previous visit.    05/02/24    ECHO (TTE) COMPLETE (PRN CONTRAST/BUBBLE/STRAIN/3D) 05/06/2024 10:52 AM (Final)    Interpretation Summary    Left Ventricle: Normal left ventricular systolic function with a visually estimated EF of 70 - 75%. Left ventricle size is normal. Normal wall thickness. Normal wall motion. Normal diastolic function.    Right Ventricle: Right ventricle is moderately dilated. Normal systolic function.    Right Atrium: Right atrium is  mildly dilated.  Pericardium: Moderate (1-2 cm) localized pericardial effusion present around the left ventricle. Pericardial posterior effusion measures 1.0 cm.    Image quality is adequate. Contrast used: Lumason . Technically difficult study, technically difficult study with poor endocardial visualization, technically difficult study due to patient's body habitus and technically difficult study due to patient's heart rhythm.    Signed by: Laquetta Commander, MD on 05/06/2024 10:52 AM       MAR reviewed and pertinent medications noted or modified as needed    MEDS:   Current Facility-Administered Medications   Medication Dose Route Frequency Provider Last Rate Last Admin    meclizine  (ANTIVERT ) tablet 12.5 mg  12.5 mg Oral TID PRN Ronn, Madison A, PA-C        ipratropium 0.5 mg-albuterol  2.5 mg (DUONEB ) nebulizer solution 1 Dose  1 Dose Inhalation BID RT Aimee Elspeth SAUNDERS, MD   1 Dose at 06/14/24 2156    guaiFENesin  (MUCINEX ) extended release tablet 600 mg  600 mg Oral BID Gillard, Madison A, PA-C   600 mg at 06/15/24 9166    benzonatate  (TESSALON ) capsule 100 mg  100 mg Oral TID PRN Ronn Dixon A, PA-C   100 mg at 06/10/24 1217    influenza tiss-cult vaccine (FLUCELVAX) injection 0.5 mL  1 Dose IntraMUSCular Prior to discharge Aimee Elspeth SAUNDERS, MD        metoprolol  tartrate (LOPRESSOR ) tablet 50 mg  50 mg Oral BID Azeem, Ahad, MD   50 mg at 06/15/24 9166    OLANZapine  (ZYPREXA ) tablet 10 mg  10 mg Oral Nightly Punyala, Srinivasa R, MD   10 mg at 06/14/24 2318    atorvastatin  (LIPITOR ) tablet 40 mg  40 mg Oral Nightly Azeem, Ahad, MD   40 mg at 06/14/24 2318    haloperidol  lactate (HALDOL ) injection 5 mg  5 mg IntraVENous Q6H PRN Azeem, Ahad, MD   5 mg at 06/08/24 0438    budesonide  (PULMICORT ) nebulizer suspension 500 mcg  0.5 mg Nebulization BID RT Nunna, Krishidhar R, MD   500 mcg at 06/14/24 2156    insulin  glargine (LANTUS ) injection vial 10 Units  10 Units SubCUTAneous Daily Nunna, Krishidhar R, MD    10 Units at 06/13/24 0912    ipratropium 0.5 mg-albuterol  2.5 mg (DUONEB ) nebulizer solution 1 Dose  1 Dose Inhalation Q4H PRN Nunna, Krishidhar R, MD   1 Dose at 06/08/24 0304    insulin  lispro (HUMALOG ,ADMELOG ) injection vial 0-8 Units  0-8 Units SubCUTAneous Q4H Nunna, Krishidhar R, MD   2 Units at 06/14/24 0043    0.9 % sodium chloride  infusion   IntraVENous PRN Nunna, Krishidhar R, MD        albuterol  (PROVENTIL ) nebulizer solution 2.5 mg  2.5 mg Nebulization Q4H PRN Nunna, Krishidhar R, MD        [Held by provider] tiotropium bromide  (SPIRIVA  RESPIMAT) 2.5 MCG/ACT inhaler 2 puff  2 puff Inhalation Daily RT Rawland Veleta SAUNDERS, MD        [Held by provider] budesonide -formoterol  (SYMBICORT ) 160-4.5 MCG/ACT inhaler 2 puff  2 puff Inhalation BID RT Nunna, Krishidhar R, MD        hydrALAZINE  (APRESOLINE ) injection 10 mg  10 mg IntraVENous Q4H PRN Sahib, Taaliba A, APRN - NP   10 mg at 06/07/24 0202    glucose chewable tablet 16 g  4 tablet Oral PRN Nunna, Krishidhar R, MD   16 g at 06/12/24 0245    dextrose  bolus 10% 125 mL  125 mL IntraVENous PRN Nunna,  Veleta SAUNDERS, MD        Or    dextrose  bolus 10% 250 mL  250 mL IntraVENous PRN Nunna, Krishidhar R, MD        glucagon  injection 1 mg  1 mg SubCUTAneous PRN Nunna, Krishidhar R, MD        dextrose  10 % infusion   IntraVENous Continuous PRN Nunna, Krishidhar R, MD        sertraline  (ZOLOFT ) tablet 100 mg  100 mg Oral Daily Nunna, Krishidhar R, MD   100 mg at 06/15/24 9166    apixaban  (ELIQUIS ) tablet 5 mg  5 mg Per NG tube BID Nunna, Krishidhar R, MD   5 mg at 06/15/24 9166    sodium chloride  flush 0.9 % injection 5-40 mL  5-40 mL IntraVENous 2 times per day Marvis Lynwood HERO, APRN - CNP   10 mL at 06/15/24 9166    sodium chloride  flush 0.9 % injection 5-40 mL  5-40 mL IntraVENous PRN Marvis Lynwood HERO, APRN - CNP        0.9 % sodium chloride  infusion   IntraVENous PRN Marvis Lynwood HERO, APRN - CNP        lidocaine  1 % injection 50 mg  50 mg IntraDERmal Once Marvis Lynwood HERO,  APRN - CNP        ALTEplase  (CATHFLO) injection 2 mg  2 mg IntraCATHeter PRN Marvis Lynwood HERO, APRN - CNP        sodium chloride  flush 0.9 % injection 5-40 mL  5-40 mL IntraVENous PRN Marvis Lynwood HERO, APRN - CNP        0.9 % sodium chloride  infusion   IntraVENous PRN Marvis Lynwood HERO, APRN - CNP   Stopped at 06/05/24 2344    potassium chloride  20 mEq/50 mL IVPB (Central Line)  20 mEq IntraVENous PRN Marvis Lynwood HERO, APRN - CNP        Or    potassium chloride  10 mEq/100 mL IVPB (Peripheral Line)  10 mEq IntraVENous PRN Marvis Lynwood HERO, APRN - CNP        magnesium  sulfate 2000 mg in 50 mL IVPB premix  2,000 mg IntraVENous PRN Marvis Lynwood HERO, APRN - CNP        ondansetron  (ZOFRAN -ODT) disintegrating tablet 4 mg  4 mg Oral Q8H PRN Marvis Lynwood HERO, APRN - CNP        Or    ondansetron  (ZOFRAN ) injection 4 mg  4 mg IntraVENous Q6H PRN Marvis Lynwood HERO, APRN - CNP   4 mg at 06/07/24 1021    polyethylene glycol (GLYCOLAX ) packet 17 g  17 g Oral Daily PRN Marvis Lynwood HERO, APRN - CNP        acetaminophen  (TYLENOL ) tablet 650 mg  650 mg Oral Q6H PRN Marvis Lynwood HERO, APRN - CNP   650 mg at 06/14/24 2317    Or    acetaminophen  (TYLENOL ) suppository 650 mg  650 mg Rectal Q6H PRN Marvis Lynwood HERO, APRN - CNP            ARTERIAL BLOOD GAS  No results for input(s): PHART, PCO2ART, PO2ART, HCO3ART, BEART, TCO2ARRT, HGBART, PO2CORART, FIO2A, O2SATART, OXYHEM, CARBOXHGBART, METHGBART, O2CONTART, PHCORART, TEMP in the last 72 hours.    Invalid input(s): PCO2COART    Labs:    Recent Labs     06/14/24  1010   WBC 6.3   HGB 10.1*   PLT 97*     Recent Labs     06/14/24  1010   NA 143   K 3.3*   CL 102   CO2 34*   GLUCOSE 145*   BUN 13   CREATININE 0.66   CALCIUM  8.9     No results for input(s): CKTOTAL, CKMB, CKMBINDEX, TROPONINI in the last 72 hours.  Lab Results   Component Value Date/Time    PROBNP 11,914 06/03/2024 05:54 PM      Lab Results   Component Value Date/Time    TSH 2.400 06/09/2024 01:50 PM      Results        Procedure Component Value Units Date/Time    Culture, Blood 1 [[id number]] Collected: 06/09/24 1420    Order Status: Sent Specimen: Blood Updated: 06/09/24 1451    Legionella antigen, urine [[id number]] Collected: 06/04/24 1228    Order Status: Completed Specimen: Urine Updated: 06/06/24 1636     Sample Site Urine        L pneumophila S1 Ag, Ur Negative        Comment: Presumptive negative for L. pneumophila serogroup 1 antigen in urine,  suggesting no recent or current infection. Legionnaires' disease  cannot be ruled out since other serogroups and species may also  cause disease.  Performed At: [facility]  [address] [place], [place] [id number]  Jennette Shorter MD Ey:[phone]         S. pneumonia Antigen, Urine/CSF [[id number]] Collected: 06/04/24 1228    Order Status: Completed Specimen: Urine Updated: 06/06/24 1636     Sample Site Urine        Specimen Urine     S pneumoniae Ag, CSF Negative        FLUID CULTURE Not indicated.     Organism Id Not indicated.     Please note: Comment        Comment: College of [employer]ologists standards require a culture to be  performed on CSF specimens submitted for bacterial antigen testing.  (CAP [id number]) Urine specimens will not be cultured.  Performed At: [facility]  [address] [place], [place] [id number]  Jennette Shorter MD Ey:[phone]         COVID-19 & Influenza Combo [[id number]] Collected: 06/04/24 0845    Order Status: Completed Specimen: Nasopharynx Updated: 06/04/24 0934     SARS-CoV-2, PCR Not Detected        Comment: Not Detected results do not preclude SARS-CoV-2 infection and should not be used as the sole basis for patient management decisions. Results must be combined with clinical observations, patient history, and epidemiological information        Rapid Influenza A By PCR Not Detected        Rapid Influenza B By PCR Not Detected        Comment:    Testing was performed using cobas Liat SARS-CoV-2 and Influenza A/B nucleic  acid assay.  This test is a multiplex Real-Time Reverse Transcriptas         Culture, Blood 2 [[id number]]  (Abnormal) Collected: 06/04/24 0300    Order Status: Completed Specimen: Blood Updated: 06/06/24 0836     Special Requests No Special Requests        Culture       Staphylococcus species, coagulase negative growing in 1 of 2 bottles drawn No Site Indicated                  This isolate represents a common contaminant and may or may not be clinically significant. Please notify the laboratory if further work  is indicated.            (NOTE) CALLED TO ;CAMBPELL IRBY RN@[email or web address] .AM    Culture, Blood, PCR ID Panel [[id number]]  (Abnormal) Collected: 06/04/24 0300    Order Status: Completed Specimen: Blood Updated: 06/05/24 1622     Accession Number Blood        Enterococcus faecalis by PCR Not detected     Enterococcus faecium by PCR Not detected     Listeria monocytogenes by PCR Not detected     STAPHYLOCOCCUS Detected        Staphylococcus Aureus Not detected     Staphylococcus epidermidis by PCR Detected        Staphylococcus lugdunensis by PCR Not detected     STREPTOCOCCUS Not detected     Streptococcus agalactiae (Group B) Not detected     Strep pneumoniae Not detected     Strep pyogenes,(Grp. A) Not detected     Acinetobacter calcoac baumannii complex by PCR Not detected     Bacteroides fragilis by PCR Not detected     Enterobacteriaceae by PCR Not detected     Enterobacter cloacae complex by PCR Not detected     Escherichia Coli Not detected     Klebsiella aerogenes by PCR Not detected     Klebsiella oxytoca by PCR Not detected     Klebsiella pneumoniae group by PCR Not detected     Proteus by PCR Not detected     Salmonella species by PCR Not detected     Serratia marcescens by PCR Not detected     Haemophilus Influenzae by PCR Not detected     Neisseria meningitidis by PCR Not detected     Pseudomonas aeruginosa Not detected     Stenotrophomonas maltophilia by PCR Not detected     Candida albicans by PCR Not  detected     Candida auris by PCR Not detected     Candida glabrata Not detected     Candida krusei by PCR Not detected     Candida parapsilosis by PCR Not detected     Candida tropicalis by PCR Not detected     Cryptococcus neoformans/gattii by PCR Not detected     Resistant gene targets          Methicillin Resistance mecA/C  by PCR Detected        [employer] test comment False positive results may rarely occur. Correlate with     Comment: clinical,epidemiologic,  and other laboratory findings  Please see BCID Interpretation Guide in EPIC Links         MRSA by PCR [[id number]] Collected: 06/04/24 0215    Order Status: Completed Specimen: Swab Updated: 06/04/24 0443     MRSA by PCR Not Detected       Culture, C Auris Screen [[id number]] Collected: 06/04/24 0215    Order Status: Completed Specimen: Swab Updated: 06/07/24 1449     Special Requests No Special Requests        Culture No Candida auris present       Culture, Blood 1 [[id number]] Collected: 06/04/24 0124    Order Status: Completed Specimen: Blood Updated: 06/10/24 1431     Special Requests No Special Requests        Culture No growth 6 days       Culture, Urine [[id number]] Collected: 06/03/24 2204    Order Status: Completed Specimen: Urine Updated: 06/05/24 0811     Special Requests --        No Special  Requests  Reflexed from [id number]       Culture No Growth (<1000 cfu/mL)              Imaging:  XR CHEST PORTABLE   Final Result      Persistent but improved diffuse bilateral pulmonary infiltrates.         Electronically signed by DARICE COLON      CTA HEAD NECK W CONTRAST   Final Result         1. No acute large vessel arterial occlusion. Mild to moderate atherosclerotic   changes.   2. Pulmonary hypertension.   3. Biapical airspace disease likely infectious/inflammatory.               Electronically signed by Aletha CHRISTELLA Ham      CT BRAIN PERFUSION   Final Result         1. No acute large vessel arterial occlusion. Mild to moderate atherosclerotic    changes.   2. Pulmonary hypertension.   3. Biapical airspace disease likely infectious/inflammatory.               Electronically signed by Aletha CHRISTELLA Ham      CT HEAD WO CONTRAST   Final Result   No acute intracranial findings.         Electronically signed by PAULINE BLANCH      XR CHEST PORTABLE   Final Result   1. No change bilateral pneumonia         Electronically signed by Evalene JINNY Hoit      CT HEAD WO CONTRAST   Final Result      No acute intracranial abnormality.         Electronically signed by Toribio JINNY Musick      CTA CHEST W WO CONTRAST   Final Result   Extensive bilateral pulmonary infiltrates. No evidence of pulmonary embolism.         Electronically signed by DARICE COLON      XR CHEST PORTABLE   Final Result   1. Enlarged cardiac silhouette, worsening airspace disease or pulmonary edema.   No complicating features post intubation            Electronically signed by Susie Brain      XR CHEST PORTABLE   Final Result   Cardiomegaly and mild interstitial pulmonary edema.             Electronically signed by JIMMY HABIB          No results found.               "

## 2024-06-15 NOTE — Plan of Care (Signed)
"    Problem: Chronic Conditions and Co-morbidities  Goal: Patient's chronic conditions and co-morbidity symptoms are monitored and maintained or improved  Outcome: Progressing     Problem: Discharge Planning  Goal: Discharge to home or other facility with appropriate resources  Outcome: Progressing     Problem: Respiratory - Adult  Goal: Achieves optimal ventilation and oxygenation  Outcome: Progressing     Problem: Cardiovascular - Adult  Goal: Maintains optimal cardiac output and hemodynamic stability  Outcome: Progressing  Goal: Absence of cardiac dysrhythmias or at baseline  Outcome: Progressing     "

## 2024-06-15 NOTE — Progress Notes (Signed)
 "  IMPRESSION:   Acute on chronic hypoxic respiratory failure  Chronic hypercapnic respiratory failure  Bilateral pneumonia improved  Cirrhosis of liver  Chronic A-fib      RECOMMENDATIONS/PLAN:   63 year old obese lady came in because of shortness of breath and dyspnea she is chronically on home oxygen she did not have any sleep study done in the past she has history of hypertension chronic A-fib history of EtOH cirrhosis of liver, patient also has COPD she was severely hypoxic and she was put on noninvasive ventilator CAT scan of the chest was done which showed bilateral pneumonia patient was intubated and successfully weaned off and she is using BiPAP at night and oxygen during the daytime  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 CT head was negative  Continue use antibiotic for bilateral pneumonia  Nocturnal CPAP she never had any sleep study done  Prescribe albuterol  inhaler for at home use  Patient has had worsening respiratory failure and is at risk for serious harm or death without appropriate therapy at home which include noninvasive ventilator while patient has an underlying condition of OSA it is not the primary indication for need for NIV patient has previously tried CPAP and BiPAP and has failed to improve his hypercapnia and saturation level.  Since chronic/acute respiratory failure can be prevented with the use of positive pressure ventilation and due to the nature of the patient deteriorating condition a portable volume ventilator has been ordered via mask interface for use at night as well as during the day.  The patient requires continuous access to noninvasive positive pressure ventilation therapy to reduce respiratory distress, minimize hospitalization and to control carbon dioxide retention.  Noninvasive ventilator setting BiPAP at an IPAP of 15-20 cm H2O and EPAP of 3-5 cm H2O, gradually increasing pressures to target an oxygen saturation (SpO2) of 88-92% and improved pH  and PaCO2. Higher IPAP levels (up to 25-30 cm H2O) and higher EPAP may be needed for severe hypercapnia    Continue with the diuretic  Overnight pulse oximetry shows desaturation for 24 minutes qualify for home oxygen she is on noninvasive ventilator using it every night and daytime as needed     10/22  Alert awake eating breakfast on oxygen via nasal cannula getting nebulizer treatment use the noninvasive ventilator BiPAP her pCO2 was 60 discussed with her about getting noninvasive ventilator will order one but issue is the compliance, she had a history of polysubstance abuse and will do 6-minute walk test before discharge to see if she qualifies for POC.  10/23  Patient seen and examined at the bedside getting nebulizer treatment RT was present in the room denies any chest pain shortness of breath awaiting for noninvasive ventilator which has been ordered discussed with her about her condition and using inhalers and compliance with the noninvasive ventilator once patient get the noninvasive ventilator she can be discharged patient getting nebulizer treatment also on doxycycline .  Culture positive most likely contamination continue with the Bumex .  Recommend abstinence from substance abuse.  10/24   Patient was alert and awake at room air. She is tolerating a full diet. She denies any chest pain, shortness of breath. She is awaiting her noninvasive ventilator which has been ordered. Discussed inhaler use and compliance with the ventilator upon discharge. Continue with the bumex . Recommend abstinence from substance abuse.  Okay to discharge  10/25  Patient was alert and awake using her mask this morning. She says that her NIV machine has  helped a lot. She denies any chest pain or shortness of breath today. She needs a prescription for her albuterol  rescue inhaler upon discharge. Continue with bumex . Recommend abstinence from substance abuse. Okay to discharge.  Asking for albuterol  inhaler as an  outpatient.  10/26  She was sleeping comfortably.  Last ABG shows pCO2 of 57 pO2 of 78 pH well compensated discussed with her about the compliance continue to use the NIV which was ordered and signed for the DME company     [x]  High complexity decision making was performed  [x]  See my orders for details    PMH:  has a past medical history of Anxiety, Depression, Gastrointestinal disorder, Heart failure (HCC), Hyperlipidemia, Hypertension, Liver disease, and Pneumonia.    PSH:   has a past surgical history that includes Cataract removal; egd transoral biopsy single/multiple (05/17/2009); Dilation and curettage of uterus (1999); Hysterectomy (2008); Cesarean section (1986 and 1993); Adenoidectomy (1972); Tonsillectomy (1972); back surgery; and Upper gastrointestinal endoscopy (N/A, 04/17/2024).     FHX: family history includes Cancer in her mother; Heart Disease in her father.     SHX:  reports that she quit smoking about 16 years ago. Her smoking use included cigarettes. She has never used smokeless tobacco. She reports that she does not drink alcohol and does not use drugs.    ALL:   Allergies   Allergen Reactions    Latex Rash        MEDS:   [x]  Reviewed - As Below   []  Not reviewed    Current Facility-Administered Medications   Medication Dose Route Frequency Provider Last Rate Last Admin    meclizine  (ANTIVERT ) tablet 12.5 mg  12.5 mg Oral TID PRN Gillard, Madison A, PA-C        ipratropium 0.5 mg-albuterol  2.5 mg (DUONEB ) nebulizer solution 1 Dose  1 Dose Inhalation BID RT Aimee Elspeth SAUNDERS, MD   1 Dose at 06/15/24 0957    guaiFENesin  (MUCINEX ) extended release tablet 600 mg  600 mg Oral BID Gillard, Madison A, PA-C   600 mg at 06/15/24 9166    benzonatate  (TESSALON ) capsule 100 mg  100 mg Oral TID PRN Ronn Dixon A, PA-C   100 mg at 06/10/24 1217    influenza tiss-cult vaccine (FLUCELVAX) injection 0.5 mL  1 Dose IntraMUSCular Prior to discharge Aimee Elspeth SAUNDERS, MD        metoprolol  tartrate (LOPRESSOR )  tablet 50 mg  50 mg Oral BID Azeem, Ahad, MD   50 mg at 06/15/24 9166    OLANZapine  (ZYPREXA ) tablet 10 mg  10 mg Oral Nightly Punyala, Srinivasa R, MD   10 mg at 06/14/24 2318    atorvastatin  (LIPITOR ) tablet 40 mg  40 mg Oral Nightly Azeem, Ahad, MD   40 mg at 06/14/24 2318    haloperidol  lactate (HALDOL ) injection 5 mg  5 mg IntraVENous Q6H PRN Azeem, Ahad, MD   5 mg at 06/08/24 0438    budesonide  (PULMICORT ) nebulizer suspension 500 mcg  0.5 mg Nebulization BID RT Nunna, Krishidhar R, MD   500 mcg at 06/15/24 0957    insulin  glargine (LANTUS ) injection vial 10 Units  10 Units SubCUTAneous Daily Nunna, Krishidhar R, MD   10 Units at 06/13/24 0912    ipratropium 0.5 mg-albuterol  2.5 mg (DUONEB ) nebulizer solution 1 Dose  1 Dose Inhalation Q4H PRN Nunna, Krishidhar R, MD   1 Dose at 06/08/24 0304    insulin  lispro (HUMALOG ,ADMELOG ) injection vial 0-8 Units  0-8 Units  SubCUTAneous Q4H Nunna, Krishidhar R, MD   2 Units at 06/14/24 0043    0.9 % sodium chloride  infusion   IntraVENous PRN Nunna, Krishidhar R, MD        albuterol  (PROVENTIL ) nebulizer solution 2.5 mg  2.5 mg Nebulization Q4H PRN Nunna, Krishidhar R, MD        [Held by provider] tiotropium bromide  (SPIRIVA  RESPIMAT) 2.5 MCG/ACT inhaler 2 puff  2 puff Inhalation Daily RT Nunna, Krishidhar R, MD        [Held by provider] budesonide -formoterol  (SYMBICORT ) 160-4.5 MCG/ACT inhaler 2 puff  2 puff Inhalation BID RT Nunna, Krishidhar R, MD        hydrALAZINE  (APRESOLINE ) injection 10 mg  10 mg IntraVENous Q4H PRN Sahib, Taaliba A, APRN - NP   10 mg at 06/07/24 0202    glucose chewable tablet 16 g  4 tablet Oral PRN Nunna, Krishidhar R, MD   16 g at 06/12/24 0245    dextrose  bolus 10% 125 mL  125 mL IntraVENous PRN Nunna, Krishidhar R, MD        Or    dextrose  bolus 10% 250 mL  250 mL IntraVENous PRN Nunna, Krishidhar R, MD        glucagon  injection 1 mg  1 mg SubCUTAneous PRN Nunna, Krishidhar R, MD        dextrose  10 % infusion   IntraVENous Continuous PRN  Nunna, Krishidhar R, MD        sertraline  (ZOLOFT ) tablet 100 mg  100 mg Oral Daily Nunna, Krishidhar R, MD   100 mg at 06/15/24 9166    apixaban  (ELIQUIS ) tablet 5 mg  5 mg Per NG tube BID Nunna, Krishidhar R, MD   5 mg at 06/15/24 9166    sodium chloride  flush 0.9 % injection 5-40 mL  5-40 mL IntraVENous 2 times per day Marvis Lynwood HERO, APRN - CNP   10 mL at 06/15/24 9166    sodium chloride  flush 0.9 % injection 5-40 mL  5-40 mL IntraVENous PRN Marvis Lynwood HERO, APRN - CNP        0.9 % sodium chloride  infusion   IntraVENous PRN Marvis Lynwood HERO, APRN - CNP        lidocaine  1 % injection 50 mg  50 mg IntraDERmal Once Marvis Lynwood HERO, APRN - CNP        ALTEplase  (CATHFLO) injection 2 mg  2 mg IntraCATHeter PRN Marvis Lynwood HERO, APRN - CNP        sodium chloride  flush 0.9 % injection 5-40 mL  5-40 mL IntraVENous PRN Marvis Lynwood HERO, APRN - CNP        0.9 % sodium chloride  infusion   IntraVENous PRN Marvis Lynwood HERO, APRN - CNP   Stopped at 06/05/24 2344    potassium chloride  20 mEq/50 mL IVPB (Central Line)  20 mEq IntraVENous PRN Marvis Lynwood HERO, APRN - CNP        Or    potassium chloride  10 mEq/100 mL IVPB (Peripheral Line)  10 mEq IntraVENous PRN Marvis Lynwood HERO, APRN - CNP        magnesium  sulfate 2000 mg in 50 mL IVPB premix  2,000 mg IntraVENous PRN Marvis Lynwood HERO, APRN - CNP        ondansetron  (ZOFRAN -ODT) disintegrating tablet 4 mg  4 mg Oral Q8H PRN Marvis Lynwood HERO, APRN - CNP        Or    ondansetron  (ZOFRAN ) injection 4 mg  4 mg  IntraVENous Q6H PRN Marvis Lynwood HERO, APRN - CNP   4 mg at 06/07/24 1021    polyethylene glycol (GLYCOLAX ) packet 17 g  17 g Oral Daily PRN Marvis Lynwood HERO, APRN - CNP        acetaminophen  (TYLENOL ) tablet 650 mg  650 mg Oral Q6H PRN Marvis Lynwood HERO, APRN - CNP   650 mg at 06/14/24 2317    Or    acetaminophen  (TYLENOL ) suppository 650 mg  650 mg Rectal Q6H PRN Marvis Lynwood HERO, APRN - CNP            MAR reviewed and pertinent medications noted or modified as needed      MND:Ezmupwzwu items are noted in  HPI.      Hemodynamics:    CO:    CI:    CVP:    SVR:   PAP Systolic:    PAP Diastolic:    PVR:    SV02:        Ventilator Settings:      Mode Rate TV Press PEEP FiO2 PIP Min. Vent   (S) CPAP/PS 16 bpm  380 mL    6 45 %  13 cmH2O           Vital Signs: Telemetry:    AFIB Intake/Output:   BP (!) 110/39   Pulse 65   Temp 97.8 F (36.6 C) (Oral)   Resp 18   Ht 1.575 m (5' 2)   Wt 115 kg (253 lb 8.5 oz)   SpO2 97%   BMI 46.36 kg/m     Temp (24hrs), Avg:97.7 F (36.5 C), Min:97.6 F (36.4 C), Max:97.8 F (36.6 C)        O2 Device: Nasal cannula O2 Flow Rate (L/min): 3 L/min       Wt Readings from Last 4 Encounters:   06/15/24 115 kg (253 lb 8.5 oz)   06/06/24 126.6 kg (279 lb 1.6 oz)   05/03/24 129.7 kg (286 lb)   05/05/24 129.7 kg (286 lb)          Intake/Output Summary (Last 24 hours) at 06/15/2024 0959  Last data filed at 06/14/2024 2307  Gross per 24 hour   Intake 350.56 ml   Output --   Net 350.56 ml       Last shift:      No intake/output data recorded.  Last 3 shifts: 10/24 1901 - 10/26 0700  In: 900.6 [P.O.:500]  Out: -        Physical Exam:     General: Caucasian female; alert awake on oxygen via nasal cannula  HEENT: NCAT, poor dentition, lips and mucosa dry  Eyes: anicteric; conjunctiva clear  Neck: no nodes, trach midline; no accessory MM use.  Chest: no deformity,  Cardiac: IR regular; no murmur;   Lungs: distant breath sounds; bilateral rales  Abd: soft, NT, hypoactive BS  Ext: Bilateral leg edema; no joint swelling; No clubbing  GU: NO foley, clear urine  Neuro: No focal deficit  Psych- no agitation, oriented to person;   Skin: warm, dry, no cyanosis;   Pulses: 1-2+ Bilateral pedal, radial  Capillary: brisk; pale      DATA:  No results found for this or any previous visit.    05/02/24    ECHO (TTE) COMPLETE (PRN CONTRAST/BUBBLE/STRAIN/3D) 05/06/2024 10:52 AM (Final)    Interpretation Summary    Left Ventricle: Normal left ventricular systolic function with a visually estimated EF of 70 - 75%.  Left ventricle  size is normal. Normal wall thickness. Normal wall motion. Normal diastolic function.    Right Ventricle: Right ventricle is moderately dilated. Normal systolic function.    Right Atrium: Right atrium is mildly dilated.    Pericardium: Moderate (1-2 cm) localized pericardial effusion present around the left ventricle. Pericardial posterior effusion measures 1.0 cm.    Image quality is adequate. Contrast used: Lumason . Technically difficult study, technically difficult study with poor endocardial visualization, technically difficult study due to patient's body habitus and technically difficult study due to patient's heart rhythm.    Signed by: Laquetta Commander, MD on 05/06/2024 10:52 AM       MAR reviewed and pertinent medications noted or modified as needed    MEDS:   Current Facility-Administered Medications   Medication Dose Route Frequency Provider Last Rate Last Admin    meclizine  (ANTIVERT ) tablet 12.5 mg  12.5 mg Oral TID PRN Ronn, Madison A, PA-C        ipratropium 0.5 mg-albuterol  2.5 mg (DUONEB ) nebulizer solution 1 Dose  1 Dose Inhalation BID RT Aimee Elspeth SAUNDERS, MD   1 Dose at 06/15/24 0957    guaiFENesin  (MUCINEX ) extended release tablet 600 mg  600 mg Oral BID Ronn Dixon A, PA-C   600 mg at 06/15/24 0833    benzonatate  (TESSALON ) capsule 100 mg  100 mg Oral TID PRN Ronn Dixon A, PA-C   100 mg at 06/10/24 1217    influenza tiss-cult vaccine (FLUCELVAX) injection 0.5 mL  1 Dose IntraMUSCular Prior to discharge Aimee Elspeth SAUNDERS, MD        metoprolol  tartrate (LOPRESSOR ) tablet 50 mg  50 mg Oral BID Azeem, Ahad, MD   50 mg at 06/15/24 9166    OLANZapine  (ZYPREXA ) tablet 10 mg  10 mg Oral Nightly Punyala, Srinivasa R, MD   10 mg at 06/14/24 2318    atorvastatin  (LIPITOR ) tablet 40 mg  40 mg Oral Nightly Azeem, Ahad, MD   40 mg at 06/14/24 2318    haloperidol  lactate (HALDOL ) injection 5 mg  5 mg IntraVENous Q6H PRN Azeem, Ahad, MD   5 mg at 06/08/24 0438    budesonide   (PULMICORT ) nebulizer suspension 500 mcg  0.5 mg Nebulization BID RT Nunna, Krishidhar R, MD   500 mcg at 06/15/24 0957    insulin  glargine (LANTUS ) injection vial 10 Units  10 Units SubCUTAneous Daily Nunna, Krishidhar R, MD   10 Units at 06/13/24 0912    ipratropium 0.5 mg-albuterol  2.5 mg (DUONEB ) nebulizer solution 1 Dose  1 Dose Inhalation Q4H PRN Nunna, Krishidhar R, MD   1 Dose at 06/08/24 0304    insulin  lispro (HUMALOG ,ADMELOG ) injection vial 0-8 Units  0-8 Units SubCUTAneous Q4H Nunna, Krishidhar R, MD   2 Units at 06/14/24 0043    0.9 % sodium chloride  infusion   IntraVENous PRN Nunna, Krishidhar R, MD        albuterol  (PROVENTIL ) nebulizer solution 2.5 mg  2.5 mg Nebulization Q4H PRN Nunna, Krishidhar R, MD        [Held by provider] tiotropium bromide  (SPIRIVA  RESPIMAT) 2.5 MCG/ACT inhaler 2 puff  2 puff Inhalation Daily RT Rawland Veleta SAUNDERS, MD        [Held by provider] budesonide -formoterol  (SYMBICORT ) 160-4.5 MCG/ACT inhaler 2 puff  2 puff Inhalation BID RT Nunna, Krishidhar R, MD        hydrALAZINE  (APRESOLINE ) injection 10 mg  10 mg IntraVENous Q4H PRN Sahib, Taaliba A, APRN - NP   10 mg at 06/07/24 0202  glucose chewable tablet 16 g  4 tablet Oral PRN Nunna, Krishidhar R, MD   16 g at 06/12/24 0245    dextrose  bolus 10% 125 mL  125 mL IntraVENous PRN Nunna, Krishidhar R, MD        Or    dextrose  bolus 10% 250 mL  250 mL IntraVENous PRN Rawland, Krishidhar R, MD        glucagon  injection 1 mg  1 mg SubCUTAneous PRN Nunna, Krishidhar R, MD        dextrose  10 % infusion   IntraVENous Continuous PRN Nunna, Krishidhar R, MD        sertraline  (ZOLOFT ) tablet 100 mg  100 mg Oral Daily Nunna, Krishidhar R, MD   100 mg at 06/15/24 9166    apixaban  (ELIQUIS ) tablet 5 mg  5 mg Per NG tube BID Nunna, Krishidhar R, MD   5 mg at 06/15/24 9166    sodium chloride  flush 0.9 % injection 5-40 mL  5-40 mL IntraVENous 2 times per day Marvis Lynwood HERO, APRN - CNP   10 mL at 06/15/24 9166    sodium chloride  flush 0.9 %  injection 5-40 mL  5-40 mL IntraVENous PRN Marvis Lynwood HERO, APRN - CNP        0.9 % sodium chloride  infusion   IntraVENous PRN Marvis Lynwood HERO, APRN - CNP        lidocaine  1 % injection 50 mg  50 mg IntraDERmal Once Marvis Lynwood HERO, APRN - CNP        ALTEplase  (CATHFLO) injection 2 mg  2 mg IntraCATHeter PRN Marvis Lynwood HERO, APRN - CNP        sodium chloride  flush 0.9 % injection 5-40 mL  5-40 mL IntraVENous PRN Marvis Lynwood HERO, APRN - CNP        0.9 % sodium chloride  infusion   IntraVENous PRN Marvis Lynwood HERO, APRN - CNP   Stopped at 06/05/24 2344    potassium chloride  20 mEq/50 mL IVPB (Central Line)  20 mEq IntraVENous PRN Marvis Lynwood HERO, APRN - CNP        Or    potassium chloride  10 mEq/100 mL IVPB (Peripheral Line)  10 mEq IntraVENous PRN Marvis Lynwood HERO, APRN - CNP        magnesium  sulfate 2000 mg in 50 mL IVPB premix  2,000 mg IntraVENous PRN Marvis Lynwood HERO, APRN - CNP        ondansetron  (ZOFRAN -ODT) disintegrating tablet 4 mg  4 mg Oral Q8H PRN Marvis Lynwood HERO, APRN - CNP        Or    ondansetron  (ZOFRAN ) injection 4 mg  4 mg IntraVENous Q6H PRN Marvis Lynwood HERO, APRN - CNP   4 mg at 06/07/24 1021    polyethylene glycol (GLYCOLAX ) packet 17 g  17 g Oral Daily PRN Marvis Lynwood HERO, APRN - CNP        acetaminophen  (TYLENOL ) tablet 650 mg  650 mg Oral Q6H PRN Marvis Lynwood HERO, APRN - CNP   650 mg at 06/14/24 2317    Or    acetaminophen  (TYLENOL ) suppository 650 mg  650 mg Rectal Q6H PRN Marvis Lynwood HERO, APRN - CNP            ARTERIAL BLOOD GAS  No results for input(s): PHART, PCO2ART, PO2ART, HCO3ART, BEART, TCO2ARRT, HGBART, PO2CORART, FIO2A, O2SATART, OXYHEM, CARBOXHGBART, METHGBART, O2CONTART, PHCORART, TEMP in the last 72 hours.    Invalid input(s): PCO2COART    Labs:  Recent Labs     06/14/24  1010   WBC 6.3   HGB 10.1*   PLT 97*     Recent Labs     06/14/24  1010   NA 143   K 3.3*   CL 102   CO2 34*   GLUCOSE 145*   BUN 13   CREATININE 0.66   CALCIUM  8.9     No results for input(s): CKTOTAL,  CKMB, CKMBINDEX, TROPONINI in the last 72 hours.  Lab Results   Component Value Date/Time    PROBNP 11,914 06/03/2024 05:54 PM      Lab Results   Component Value Date/Time    TSH 2.400 06/09/2024 01:50 PM      Results       Procedure Component Value Units Date/Time    Culture, Blood 1 [[id number]] Collected: 06/09/24 1420    Order Status: Sent Specimen: Blood Updated: 06/09/24 1451    Legionella antigen, urine [[id number]] Collected: 06/04/24 1228    Order Status: Completed Specimen: Urine Updated: 06/06/24 1636     Sample Site Urine        L pneumophila S1 Ag, Ur Negative        Comment: Presumptive negative for L. pneumophila serogroup 1 antigen in urine,  suggesting no recent or current infection. Legionnaires' disease  cannot be ruled out since other serogroups and species may also  cause disease.  Performed At: [facility]  [address] [place], [place] [id number]  Jennette Shorter MD Ey:[id number]         S. pneumonia Antigen, Urine/CSF [[id number]] Collected: 06/04/24 1228    Order Status: Completed Specimen: Urine Updated: 06/06/24 1636     Sample Site Urine        Specimen Urine     S pneumoniae Ag, CSF Negative        FLUID CULTURE Not indicated.     Organism Id Not indicated.     Please note: Comment        Comment: College of [employer]ologists standards require a culture to be  performed on CSF specimens submitted for bacterial antigen testing.  (CAP [id number]) Urine specimens will not be cultured.  Performed At: [facility]  [address] [place], [place] [id number]  Jennette Shorter MD Ey:[id number]         COVID-19 & Influenza Combo [[id number]] Collected: 06/04/24 0845    Order Status: Completed Specimen: Nasopharynx Updated: 06/04/24 0934     SARS-CoV-2, PCR Not Detected        Comment: Not Detected results do not preclude SARS-CoV-2 infection and should not be used as the sole basis for patient management decisions. Results must be combined with clinical observations,  patient history, and epidemiological information        Rapid Influenza A By PCR Not Detected        Rapid Influenza B By PCR Not Detected        Comment:    Testing was performed using cobas Liat SARS-CoV-2 and Influenza A/B nucleic acid assay.  This test is a multiplex Real-Time Reverse Transcriptas         Culture, Blood 2 [[id number]]  (Abnormal) Collected: 06/04/24 0300    Order Status: Completed Specimen: Blood Updated: 06/06/24 0836     Special Requests No Special Requests        Culture       Staphylococcus species, coagulase negative growing in 1 of 2 bottles drawn No Site Indicated  This isolate represents a common contaminant and may or may not be clinically significant. Please notify the laboratory if further work is indicated.            (NOTE) CALLED TO ;CAMBPELL IRBY RN@[email or web address] .AM    Culture, Blood, PCR ID Panel [[id number]]  (Abnormal) Collected: 06/04/24 0300    Order Status: Completed Specimen: Blood Updated: 06/05/24 1622     Accession Number Blood        Enterococcus faecalis by PCR Not detected     Enterococcus faecium by PCR Not detected     Listeria monocytogenes by PCR Not detected     STAPHYLOCOCCUS Detected        Staphylococcus Aureus Not detected     Staphylococcus epidermidis by PCR Detected        Staphylococcus lugdunensis by PCR Not detected     STREPTOCOCCUS Not detected     Streptococcus agalactiae (Group B) Not detected     Strep pneumoniae Not detected     Strep pyogenes,(Grp. A) Not detected     Acinetobacter calcoac baumannii complex by PCR Not detected     Bacteroides fragilis by PCR Not detected     Enterobacteriaceae by PCR Not detected     Enterobacter cloacae complex by PCR Not detected     Escherichia Coli Not detected     Klebsiella aerogenes by PCR Not detected     Klebsiella oxytoca by PCR Not detected     Klebsiella pneumoniae group by PCR Not detected     Proteus by PCR Not detected     Salmonella species by PCR Not detected     Serratia marcescens by PCR  Not detected     Haemophilus Influenzae by PCR Not detected     Neisseria meningitidis by PCR Not detected     Pseudomonas aeruginosa Not detected     Stenotrophomonas maltophilia by PCR Not detected     Candida albicans by PCR Not detected     Candida auris by PCR Not detected     Candida glabrata Not detected     Candida krusei by PCR Not detected     Candida parapsilosis by PCR Not detected     Candida tropicalis by PCR Not detected     Cryptococcus neoformans/gattii by PCR Not detected     Resistant gene targets          Methicillin Resistance mecA/C  by PCR Detected        [employer] test comment False positive results may rarely occur. Correlate with     Comment: clinical,epidemiologic,  and other laboratory findings  Please see BCID Interpretation Guide in EPIC Links         MRSA by PCR [[id number]] Collected: 06/04/24 0215    Order Status: Completed Specimen: Swab Updated: 06/04/24 0443     MRSA by PCR Not Detected       Culture, C Auris Screen [[id number]] Collected: 06/04/24 0215    Order Status: Completed Specimen: Swab Updated: 06/07/24 1449     Special Requests No Special Requests        Culture No Candida auris present       Culture, Blood 1 [[id number]] Collected: 06/04/24 0124    Order Status: Completed Specimen: Blood Updated: 06/10/24 1431     Special Requests No Special Requests        Culture No growth 6 days       Culture, Urine [[id number]] Collected: 06/03/24 2204    Order Status: Completed  Specimen: Urine Updated: 06/05/24 9188     Special Requests --        No Special Requests  Reflexed from [id number]       Culture No Growth (<1000 cfu/mL)              Imaging:  XR CHEST PORTABLE   Final Result      Persistent but improved diffuse bilateral pulmonary infiltrates.         Electronically signed by DARICE COLON      CTA HEAD NECK W CONTRAST   Final Result         1. No acute large vessel arterial occlusion. Mild to moderate atherosclerotic   changes.   2. Pulmonary hypertension.   3. Biapical  airspace disease likely infectious/inflammatory.               Electronically signed by Aletha CHRISTELLA Ham      CT BRAIN PERFUSION   Final Result         1. No acute large vessel arterial occlusion. Mild to moderate atherosclerotic   changes.   2. Pulmonary hypertension.   3. Biapical airspace disease likely infectious/inflammatory.               Electronically signed by Aletha CHRISTELLA Ham      CT HEAD WO CONTRAST   Final Result   No acute intracranial findings.         Electronically signed by PAULINE BLANCH      XR CHEST PORTABLE   Final Result   1. No change bilateral pneumonia         Electronically signed by Evalene JINNY Hoit      CT HEAD WO CONTRAST   Final Result      No acute intracranial abnormality.         Electronically signed by Toribio JINNY Musick      CTA CHEST W WO CONTRAST   Final Result   Extensive bilateral pulmonary infiltrates. No evidence of pulmonary embolism.         Electronically signed by DARICE COLON      XR CHEST PORTABLE   Final Result   1. Enlarged cardiac silhouette, worsening airspace disease or pulmonary edema.   No complicating features post intubation            Electronically signed by Susie Brain      XR CHEST PORTABLE   Final Result   Cardiomegaly and mild interstitial pulmonary edema.             Electronically signed by JIMMY HABIB          No results found.               "

## 2024-06-15 NOTE — Plan of Care (Signed)
"    Problem: Chronic Conditions and Co-morbidities  Goal: Patient's chronic conditions and co-morbidity symptoms are monitored and maintained or improved  06/15/2024 0831 by Okey Sor, RN  Outcome: Progressing  06/15/2024 0636 by Janell Harlene Frees, RN  Outcome: Progressing     Problem: Discharge Planning  Goal: Discharge to home or other facility with appropriate resources  06/15/2024 0831 by Okey Sor, RN  Outcome: Progressing  06/15/2024 0636 by Janell Harlene Frees, RN  Outcome: Progressing     Problem: Neurosensory - Adult  Goal: Achieves stable or improved neurological status  06/15/2024 0831 by Okey Sor, RN  Outcome: Progressing  06/15/2024 0636 by Janell Harlene Frees, RN  Outcome: Progressing  Goal: Absence of seizures  06/15/2024 0831 by Okey Sor, RN  Outcome: Progressing  06/15/2024 0636 by Janell Harlene Frees, RN  Outcome: Progressing  Goal: Achieves maximal functionality and self care  06/15/2024 0831 by Okey Sor, RN  Outcome: Progressing  06/15/2024 0636 by Janell Harlene Frees, RN  Outcome: Progressing     Problem: Respiratory - Adult  Goal: Achieves optimal ventilation and oxygenation  06/15/2024 0831 by Okey Sor, RN  Outcome: Progressing  06/15/2024 0636 by Janell Harlene Frees, RN  Outcome: Progressing     Problem: Cardiovascular - Adult  Goal: Maintains optimal cardiac output and hemodynamic stability  06/15/2024 0831 by Okey Sor, RN  Outcome: Progressing  06/15/2024 0636 by Janell Harlene Frees, RN  Outcome: Progressing  Goal: Absence of cardiac dysrhythmias or at baseline  06/15/2024 0831 by Okey Sor, RN  Outcome: Progressing  06/15/2024 0636 by Janell Harlene Frees, RN  Outcome: Progressing     Problem: Skin/Tissue Integrity - Adult  Goal: Skin integrity remains intact  Description: 1.  Monitor for areas of redness and/or skin breakdown  2.  Assess vascular access sites hourly  3.  Every 4-6 hours minimum:  Change oxygen saturation probe site  4.  Every  4-6 hours:  If on nasal continuous positive airway pressure, respiratory therapy assess nares and determine need for appliance change or resting period  06/15/2024 0831 by Okey Sor, RN  Outcome: Progressing  06/15/2024 0636 by Janell Harlene Frees, RN  Outcome: Progressing  Goal: Incisions, wounds, or drain sites healing without S/S of infection  06/15/2024 0831 by Okey Sor, RN  Outcome: Progressing  06/15/2024 0636 by Janell Harlene Frees, RN  Outcome: Progressing  Goal: Oral mucous membranes remain intact  06/15/2024 0831 by Okey Sor, RN  Outcome: Progressing  06/15/2024 0636 by Janell Harlene Frees, RN  Outcome: Progressing     "

## 2024-06-15 NOTE — Progress Notes (Signed)
 "Pulmonary and Critical Care progress note    Subjective:     This patient has been seen and evaluated at the request of Dr. Phill    63 year old obese Caucasian lady  I am asked to see for acute respiratory failure with hypoxia and hypercapnia    Patient has a past medical history significant for chronic hypoxic respiratory failure on 3 L nasal cannula secondary to COPD, obstructive sleep apnea on CPAP, anxiety, depression, HTN, A-fib on Eliquis , T2DM, pancreatitis and EtOH cirrhosis     Presented to ED on 06/03/2024 for worsening dyspnea.  In ED, patient severely hypoxic with increased work of breathing which was refractory to BiPAP, requiring intubation.  CTA chest shows extensive bilateral pulmonary infiltrates concerning for severe bilateral pneumonia, however negative for PE.  Started on empiric IV ceftriaxone  and azithromycin .  Respiratory status improved and patient was able to be extubated to BiPAP and further decreased to home dose 3 L NC.  However, patient remained confused so psychiatry was consulted.  Started on Zoloft  and Zyprexa .  Stroke alert was called on 10/17 for left-sided weakness with complete left hemianopia.  CT head, CTA head/neck and CT brain perfusion with no acute findings.  Patient unable to tolerate MRI.  Neurology evaluated, consider EEG if clinical status worsening otherwise no further stroke work up.      Transferred to medical service on 10/20.  I was asked to see the patient to take over her pulmonary management by intensivist Dr. Azeem.    ABGs 06/09/2024 showed:  pH of 7.40, pCO2 60, pO2 81, bicarb 37, saturation 96% on BiPAP with 32% FiO2    Pt with increased work of breathing and lethargy on 10/21 with repeat ABG showing worsening hypercapnia of 60.  Started on nightly BiPAP with significant improvement in breathing and normalization of mental status.     Echocardiogram 05/05/2024 reviewed personally showing:  LVEF 70 to 75%  Dilated RA and RV    Last chest x-ray 06/05/24  reviewed personally showing:  Patient was still intubated at that time  Bilateral infiltrates    Currently on nasal cannula oxygen    06/11/2024:    Patient seen and examined  Overnight events noted    Sitting up in the bed comfortably  Having breakfast  On 3 L nasal cannula oxygen  Improved respiratory status  Much more awake and alert and interactive and answering questions appropriately  Has used BiPAP 16/8, 30% FiO2 overnight  ABGs reviewed showing pH of 7.40, pCO2 57, pO2 78, bicarb 34, saturation 95%    06/12/2024:    Patient seen and examined  Overnight events noted  Sitting up in bed comfortably  Awake and alert  Having breakfast  Seated nasal cannula oxygen  Used BiPAP 14/8, 40% FiO2 overnight  No acute distress  ABGs and imaging reviewed    06/13/2024:    Patient seen and examined  Overnight events noted  Sitting up in bed comfortably  Awake and alert  Says she did not use her BiPAP last night  Noninvasive ventilator has been ordered for the patient to DME company  Gentle diuresis  Admits to improving shortness of breath    06/14/2024  Examined at bedside  Alert and responding appropriately  She is on trilogy auto ventilator.  Used for 4 hours last night  Tolerated well and getting good feel for it    06/15/2024  Patient examined at bedside  Used her trilogy auto ventilator for 4 hours  Felt well  rested and managed to get good sleep with her trilogy auto ventilator  Denies any chest pains or palpitations    Review of Systems:  Pertinent items are noted in HPI.    Past Medical History:   Diagnosis Date    Anxiety     Depression     Gastrointestinal disorder     Heart failure (HCC)     Hyperlipidemia     Hypertension     Liver disease 2009    liver failure    Pneumonia      Past Surgical History:   Procedure Laterality Date    ADENOIDECTOMY  1972    BACK SURGERY      cement discs following a fall    CATARACT REMOVAL      CESAREAN SECTION  1986 and 1993    DILATION AND CURETTAGE OF UTERUS  1999    EGD  TRANSORAL BIOPSY SINGLE/MULTIPLE  05/17/2009         HYSTERECTOMY (CERVIX STATUS UNKNOWN)  2008    TONSILLECTOMY  1972    UPPER GASTROINTESTINAL ENDOSCOPY N/A 04/17/2024    ESOPHAGOGASTRODUODENOSCOPY performed by Madlyn Fendt, MD at [facility] ENDOSCOPY      Family History   Problem Relation Age of Onset    Cancer Mother     Heart Disease Father      Social History     Tobacco Use    Smoking status: Former     Current packs/day: 0.00     Types: Cigarettes     Quit date: 02/28/2008     Years since quitting: 16.3    Smokeless tobacco: Never   Substance Use Topics    Alcohol use: Never      Current Facility-Administered Medications   Medication Dose Route Frequency Provider Last Rate Last Admin    meclizine  (ANTIVERT ) tablet 12.5 mg  12.5 mg Oral TID PRN Gillard, Madison A, PA-C        ipratropium 0.5 mg-albuterol  2.5 mg (DUONEB ) nebulizer solution 1 Dose  1 Dose Inhalation BID RT Aimee Elspeth SAUNDERS, MD   1 Dose at 06/15/24 0957    guaiFENesin  (MUCINEX ) extended release tablet 600 mg  600 mg Oral BID Ronn Dixon A, PA-C   600 mg at 06/15/24 9166    benzonatate  (TESSALON ) capsule 100 mg  100 mg Oral TID PRN Ronn Dixon A, PA-C   100 mg at 06/10/24 1217    influenza tiss-cult vaccine (FLUCELVAX) injection 0.5 mL  1 Dose IntraMUSCular Prior to discharge Aimee Elspeth SAUNDERS, MD        metoprolol  tartrate (LOPRESSOR ) tablet 50 mg  50 mg Oral BID Azeem, Ahad, MD   50 mg at 06/15/24 9166    OLANZapine  (ZYPREXA ) tablet 10 mg  10 mg Oral Nightly Punyala, Srinivasa R, MD   10 mg at 06/14/24 2318    atorvastatin  (LIPITOR ) tablet 40 mg  40 mg Oral Nightly Azeem, Ahad, MD   40 mg at 06/14/24 2318    haloperidol  lactate (HALDOL ) injection 5 mg  5 mg IntraVENous Q6H PRN Azeem, Ahad, MD   5 mg at 06/08/24 0438    budesonide  (PULMICORT ) nebulizer suspension 500 mcg  0.5 mg Nebulization BID RT Nunna, Krishidhar R, MD   500 mcg at 06/15/24 0957    insulin  glargine (LANTUS ) injection vial 10 Units  10 Units SubCUTAneous Daily Rawland Anton R, MD   10 Units at 06/13/24 0912    ipratropium 0.5 mg-albuterol  2.5 mg (DUONEB ) nebulizer solution 1 Dose  1 Dose Inhalation Q4H PRN Nunna, Krishidhar R, MD   1 Dose at 06/08/24 0304    insulin  lispro (HUMALOG ,ADMELOG ) injection vial 0-8 Units  0-8 Units SubCUTAneous Q4H Nunna, Krishidhar R, MD   2 Units at 06/14/24 0043    0.9 % sodium chloride  infusion   IntraVENous PRN Nunna, Krishidhar R, MD        albuterol  (PROVENTIL ) nebulizer solution 2.5 mg  2.5 mg Nebulization Q4H PRN Nunna, Krishidhar R, MD        [Held by provider] tiotropium bromide  (SPIRIVA  RESPIMAT) 2.5 MCG/ACT inhaler 2 puff  2 puff Inhalation Daily RT Nunna, Krishidhar R, MD        [Held by provider] budesonide -formoterol  (SYMBICORT ) 160-4.5 MCG/ACT inhaler 2 puff  2 puff Inhalation BID RT Nunna, Krishidhar R, MD        hydrALAZINE  (APRESOLINE ) injection 10 mg  10 mg IntraVENous Q4H PRN Sahib, Taaliba A, APRN - NP   10 mg at 06/07/24 0202    glucose chewable tablet 16 g  4 tablet Oral PRN Nunna, Krishidhar R, MD   16 g at 06/12/24 0245    dextrose  bolus 10% 125 mL  125 mL IntraVENous PRN Nunna, Krishidhar R, MD        Or    dextrose  bolus 10% 250 mL  250 mL IntraVENous PRN Nunna, Krishidhar R, MD        glucagon  injection 1 mg  1 mg SubCUTAneous PRN Nunna, Krishidhar R, MD        dextrose  10 % infusion   IntraVENous Continuous PRN Nunna, Krishidhar R, MD        sertraline  (ZOLOFT ) tablet 100 mg  100 mg Oral Daily Nunna, Krishidhar R, MD   100 mg at 06/15/24 9166    apixaban  (ELIQUIS ) tablet 5 mg  5 mg Per NG tube BID Nunna, Krishidhar R, MD   5 mg at 06/15/24 9166    sodium chloride  flush 0.9 % injection 5-40 mL  5-40 mL IntraVENous 2 times per day Marvis Lynwood HERO, APRN - CNP   10 mL at 06/15/24 9166    sodium chloride  flush 0.9 % injection 5-40 mL  5-40 mL IntraVENous PRN Marvis Lynwood HERO, APRN - CNP        0.9 % sodium chloride  infusion   IntraVENous PRN Marvis Lynwood HERO, APRN - CNP        lidocaine  1 % injection 50 mg  50 mg IntraDERmal Once  Marvis Lynwood HERO, APRN - CNP        ALTEplase  (CATHFLO) injection 2 mg  2 mg IntraCATHeter PRN Marvis Lynwood HERO, APRN - CNP        sodium chloride  flush 0.9 % injection 5-40 mL  5-40 mL IntraVENous PRN Marvis Lynwood HERO, APRN - CNP        0.9 % sodium chloride  infusion   IntraVENous PRN Marvis Lynwood HERO, APRN - CNP   Stopped at 06/05/24 2344    potassium chloride  20 mEq/50 mL IVPB (Central Line)  20 mEq IntraVENous PRN Marvis Lynwood HERO, APRN - CNP        Or    potassium chloride  10 mEq/100 mL IVPB (Peripheral Line)  10 mEq IntraVENous PRN Marvis Lynwood HERO, APRN - CNP        magnesium  sulfate 2000 mg in 50 mL IVPB premix  2,000 mg IntraVENous PRN Marvis Lynwood HERO, APRN - CNP        ondansetron  (ZOFRAN -ODT) disintegrating tablet 4 mg  4  mg Oral Q8H PRN Gill, James M, APRN - CNP        Or    ondansetron  (ZOFRAN ) injection 4 mg  4 mg IntraVENous Q6H PRN Marvis Lynwood HERO, APRN - CNP   4 mg at 06/07/24 1021    polyethylene glycol (GLYCOLAX ) packet 17 g  17 g Oral Daily PRN Marvis Lynwood HERO, APRN - CNP        acetaminophen  (TYLENOL ) tablet 650 mg  650 mg Oral Q6H PRN Marvis Lynwood HERO, APRN - CNP   650 mg at 06/14/24 2317    Or    acetaminophen  (TYLENOL ) suppository 650 mg  650 mg Rectal Q6H PRN Marvis Lynwood HERO, APRN - CNP              Allergies   Allergen Reactions    Latex Rash           Objective:     Blood pressure (!) 118/41, pulse 64, temperature 98.8 F (37.1 C), temperature source Oral, resp. rate 18, height 1.575 m (5' 2), weight 115 kg (253 lb 8.5 oz), SpO2 97%. Temp (24hrs), Avg:98 F (36.7 C), Min:97.7 F (36.5 C), Max:98.8 F (37.1 C)      Intake and Output:  Current Shift: No intake/output data recorded.  Last 3 Shifts: 10/24 1901 - 10/26 0700  In: 900.6 [P.O.:500]  Out: -   @INTAKEOUTPUTBRIEF @     Physical Exam:     General: Lying in bed comfortably, mild respiratory distress.  On nasal cannula oxygen.  Obese  Eye: Reactive, symmetric  Throat and Neck: Supple  Lung: Reduced air entry bilaterally with prolonged exhalation.  Occasional  wheezing.  Bilateral crackles   Heart: S1+S2.  No murmurs  Abdomen: soft, non-tender. Bowel sounds normal. No masses; obese  Extremities: No edema  GU: Not done  Skin: No cyanosis  Neurologic: A & O x3.  Grossly nonfocal  Psychiatric: Mildly anxious      Lab/Data Review:      Recent Results (from the past 24 hours)   POCT Glucose    Collection Time: 06/14/24  7:58 PM   Result Value Ref Range    POC Glucose 163 (H) 65 - 100 mg/dL    Performed by: Archibald Levan    POCT Glucose    Collection Time: 06/15/24  1:31 AM   Result Value Ref Range    POC Glucose 176 (H) 65 - 100 mg/dL    Performed by: Stefani Deforest (CON)    POCT Glucose    Collection Time: 06/15/24  4:55 AM   Result Value Ref Range    POC Glucose 111 (H) 65 - 100 mg/dL    Performed by: Ezzard Colas    POCT Glucose    Collection Time: 06/15/24  8:09 AM   Result Value Ref Range    POC Glucose 75 65 - 100 mg/dL    Performed by: Charley Purple    EKG 12 Lead    Collection Time: 06/15/24  9:37 AM   Result Value Ref Range    Ventricular Rate 66 BPM    Atrial Rate 66 BPM    P-R Interval 148 ms    QRS Duration 90 ms    Q-T Interval 480 ms    QTc Calculation (Bazett) 503 ms    P Axis 68 degrees    R Axis 61 degrees    T Axis 49 degrees    Diagnosis       Normal sinus rhythm with sinus  arrhythmia  T wave abnormality, consider anterior ischemia  Prolonged QT  Abnormal ECG  When compared with ECG of 08-Jun-2024 09:19,  Sinus rhythm has replaced Atrial fibrillation  Vent. rate has decreased by  42 bpm  Confirmed by DAVE MD, KAMLESH ([id number]) on 06/15/2024 12:46:28 PM     POCT Glucose    Collection Time: 06/15/24 11:12 AM   Result Value Ref Range    POC Glucose 106 (H) 65 - 100 mg/dL    Performed by: Charley Purple    Troponin    Collection Time: 06/15/24 12:08 PM   Result Value Ref Range    Troponin T 15.8 (H) 0 - 14 ng/L   Basic Metabolic Panel    Collection Time: 06/15/24 12:08 PM   Result Value Ref Range    Sodium 143 136 - 145 mmol/L    Potassium 3.3 (L) 3.5 - 5.1 mmol/L     Chloride 106 98 - 107 mmol/L    CO2 30 (H) 20 - 29 mmol/L    Anion Gap 8 2 - 14 mmol/L    Glucose 113 (H) 65 - 100 mg/dL    BUN 12 8 - 23 mg/dL    Creatinine 9.37 9.39 - 1.00 mg/dL    BUN/Creatinine Ratio 19 12 - 20      Est, Glom Filt Rate >90 >59 ml/min/1.53m2    Calcium  8.5 (L) 8.8 - 10.2 mg/dL   POCT Glucose    Collection Time: 06/15/24  3:32 PM   Result Value Ref Range    POC Glucose 77 65 - 100 mg/dL    Performed by: Charley Purple        XR CHEST PORTABLE   Final Result      Persistent but improved diffuse bilateral pulmonary infiltrates.         Electronically signed by DARICE COLON      CTA HEAD NECK W CONTRAST   Final Result         1. No acute large vessel arterial occlusion. Mild to moderate atherosclerotic   changes.   2. Pulmonary hypertension.   3. Biapical airspace disease likely infectious/inflammatory.               Electronically signed by Aletha CHRISTELLA Ham      CT BRAIN PERFUSION   Final Result         1. No acute large vessel arterial occlusion. Mild to moderate atherosclerotic   changes.   2. Pulmonary hypertension.   3. Biapical airspace disease likely infectious/inflammatory.               Electronically signed by Aletha CHRISTELLA Ham      CT HEAD WO CONTRAST   Final Result   No acute intracranial findings.         Electronically signed by PAULINE BLANCH      XR CHEST PORTABLE   Final Result   1. No change bilateral pneumonia         Electronically signed by Evalene JINNY Hoit      CT HEAD WO CONTRAST   Final Result      No acute intracranial abnormality.         Electronically signed by Toribio JINNY Musick      CTA CHEST W WO CONTRAST   Final Result   Extensive bilateral pulmonary infiltrates. No evidence of pulmonary embolism.         Electronically signed by DARICE COLON      XR CHEST  PORTABLE   Final Result   1. Enlarged cardiac silhouette, worsening airspace disease or pulmonary edema.   No complicating features post intubation            Electronically signed by Susie Brain      XR CHEST PORTABLE    Final Result   Cardiomegaly and mild interstitial pulmonary edema.             Electronically signed by JIMMY HABIB            Assessment:     1.  Acute on chronic respiratory failure with hypercapnia  2.  Acute on chronic respiratory failure with hypoxia  3.  Bilateral pneumonia  4.  Acute metabolic encephalopathy  5.  Acute exacerbation of COPD  6.  Obstructive sleep apnea  7.  Liver cirrhosis  8.  Chronic atrial fibrillation  9.  Obesity  10.  Polysubstance abuse  11.  Anemia and thrombocytopenia    Plan:     Patient seen on the medical floor  Being watched here closely    On BiPAP 16/8, 30% FiO2 overnight  Now on 3 L nasal cannula oxygen  Mental status much improved  ABGs showing persistent hypercapnic respiratory failure despite BiPAP which represents failure of BiPAP.   Noninvasive ventilation/trilogy arranged for her.  She used it for 4 hours last night and tolerated it well.     Basic metabolic profile results reviewed, potassium is 3.3 CO2 30  That is showing that her CO2 has improved it was 34 on 10/25    Bilateral pneumonia  Continue broad-spectrum antibiotics  Cultures sent  Expanded pneumonia workup sent  Mostly negative  Further changes in antibiotics based on clinical response and culture results  Last chest x-ray showing extensive bilateral infiltrates  Will repeat chest x-ray this a.m.  Start doxycycline   Underlying COPD  Continue Solu-Medrol   Continue nebulizers  Will need PFTs in outpatient after discharge    Altered mental status likely combination of hypoxia, hypercapnia, polysubstance abuse  UDS positive for amphetamines  It has shown improvement now  Ammonia normal  Psychiatry following and have recommended Xolox and Zyprexa   Monitor neurological status with treatment    Liver cirrhosis  Will need to see GI in outpatient  Stable for now    DVT and GI prophylaxis    Clinically improved  Out of bed to chair with PT OT  Will follow-up with me in outpatient clinic after discharge for PFTs and  sleep evaluation  From pulmonary perspective she can be discharged to home in next 24 hours  Questions of patient were answered at bedside in detail  Case discussed in detail with RN, RT, and care team including hospitalist  Thank you for involving me in the care of this patient  I will follow with you closely during hospitalization    Time spent more than 30 minutes in direct patient care with no overlap reviewing results and records, decision making, and answering questions.      Calleen Grates, MD  [facility] (PAT)  06/15/2024  3:55 PM             "

## 2024-06-15 NOTE — Care Coordination (Signed)
"  Met with the pt at bedside.  Reported appeal called@1500h  on 06/14/2025.   CM will  resond upon receipt.  Pt stated she is in part, appealing for a second medical opinion.  Discussion held regarding possible avenues for obtaining that information.  Office visit with  Pulmonary on Weds or Thurs.      The pt lives in a Motel in [place] and will need transportation assistance.      "

## 2024-06-15 NOTE — Care Coordination (Signed)
"  Request for medical documentation from [facility] is not yet received.  "

## 2024-06-15 NOTE — Discharge Summary (Signed)
 "                                       Discharge Summary    Name: Breanna Lester  [id number]  Date of Birth: 1961-06-20 (Age: 63 y.o.)   Date of Admission: 06/03/2024  Date of Discharge: 06/15/2024  Attending Physician: Breanna Elspeth SAUNDERS, MD    Discharge Diagnosis:   Principal Problem:    Acute respiratory failure [facility])  Resolved Problems:    * No resolved hospital problems. *  Acute hypoxic respiratory failure, resolved  Hypercapnic respiratory failure  Severe bilateral CAP  COPD  Concern for bacteremia, ruled out  Acute metabolic encephalopathy, resolved  Polysubstance abuse  A-fib  Microcytic anemia  Thrombocytopenia, mild  OSA    Consultations:  IP CONSULT TO PHARMACY  IP CONSULT TO VASCULAR ACCESS TEAM  IP CONSULT TO CASE MANAGEMENT  IP CONSULT TO TELE-NEUROLOGY  IP CONSULT TO VASCULAR ACCESS TEAM  IP CONSULT TO PSYCHIATRY  IP CONSULT TO DIETITIAN  IP CONSULT TO PULMONOLOGY  IP CONSULT TO PULMONOLOGY  IP CONSULT TO CASE MANAGEMENT  IP CONSULT TO CASE MANAGEMENT      Brief Admission History/Reason for Admission Per Breanna DELENA Fairly, MD:   Acute hypoxic respiratory failure    Brief Hospital Course by Main Problems:   Breanna Lester is a 62 y.o.  female with PMHx significant for chronic hypoxic respiratory failure on 3 L nasal cannula secondary to COPD, anxiety, depression, HTN, A-fib on Eliquis , T2DM, pancreatitis and EtOH cirrhosis who presented to ED on 06/03/2024 for worsening dyspnea.  In ED, patient severely hypoxic with increased work of breathing which was refractory to BiPAP, requiring intubation.  Admitted to ICU.  CTA chest showed extensive bilateral pulmonary infiltrates concerning for severe bilateral pneumonia, however negative for PE.  Started on empiric IV ceftriaxone  and azithromycin .  Expanded pneumonia workup negative for Legionella and S pneumo.  Initial blood culture showing Staph epidermidis, likely contaminant and repeat blood culture with no growth (confirmed with the lab on day of discharge).   Respiratory status improved and patient was able to be extubated to BiPAP and further decreased to home dose 3 L NC.  Completed course of ceftriaxone , azithromycin , and doxycycline .  However, patient remained confused so psychiatry was consulted.  Started on Zoloft  and Zyprexa .  Stroke alert was called on 10/17 for left-sided weakness with complete left hemianopia.  CT head, CTA head/neck and CT brain perfusion with no acute findings.  Patient unable to tolerate MRI.  Neurology evaluated, consider EEG if clinical status worsening otherwise no further stroke work up.  Transferred to medical service on 10/20.  Pt with increased work of breathing and lethargy on 10/21 with repeat ABG showing worsening hypercapnia of 60.  Started on nightly BiPAP with significant improvement in breathing and normalization of mental status.  Now stable on home dose 3 L nasal cannula.  Pulmonology consulted, recommends Trilogy NIV at discharge.  PT/OT recommending SNF.  Trilogy delivered 10/24 and patient wearing overnight, tolerating well.    Pulmonology has cleared patient for discharge.  Recommend patient get repeat chest CT or CXR in 4 to 6 weeks to ensure resolution of pneumonia. Advised patient to follow-up with PCP in 10 to 14 days with recent admission for continued monitoring and management of medications and other comorbidities.  Discussed discharge plan with patient, which patient agreed to.  All questions answered  and patient will be discharged pending HH set up, no accepting agency yet. Patient appealed discharge on 10/25.    Discharge Exam:  Patient seen and examined by me on discharge day.  No new complaints.  Stable on home dose 3 L O2.  Wore Trilogy overnight and tolerated well.  Denies shortness of breath, chest pain or cough.  Pt states CM has sent referrals for home health but no accepting company at this time.    Pertinent Findings:  Patient Vitals for the past 24 hrs:   BP Temp Temp src Pulse Resp SpO2 Weight    06/15/24 0732 (!) 110/39 97.8 F (36.6 C) Oral 65 18 97 % --   06/15/24 0600 -- -- -- -- -- -- 115 kg (253 lb 8.5 oz)   06/15/24 0445 (!) 126/54 97.7 F (36.5 C) Oral 60 16 99 % --   06/14/24 2316 (!) 126/95 -- -- 70 -- -- --   06/14/24 2300 (!) 126/95 -- -- -- -- -- --   06/14/24 1958 101/77 97.7 F (36.5 C) Oral 58 18 96 % --   06/14/24 1522 118/76 97.6 F (36.4 C) Oral 58 17 -- --       Gen:    Not in distress  Chest: Clear lungs, stable on home dose 3 L NC  CVS:   Regular rhythm.  No edema  Abd:  Soft, not distended, not tender  Neuro: At baseline, alert and oriented x 3    Discharge/Recent Laboratory Results:  Recent Labs     06/14/24  1010   NA 143   K 3.3*   CL 102   CO2 34*   BUN 13   CREATININE 0.66   GLUCOSE 145*   CALCIUM  8.9     Recent Labs     06/14/24  1010   HGB 10.1*   HCT 34.6*   WBC 6.3   PLT 97*       Discharge Medications:     Medication List        START taking these medications      apixaban  5 MG Tabs tablet  Commonly known as: ELIQUIS   Take 1 tablet by mouth 2 times daily     atorvastatin  40 MG tablet  Commonly known as: LIPITOR   Take 1 tablet by mouth nightly     bumetanide  1 MG tablet  Commonly known as: BUMEX   Take 1 tablet by mouth daily     levoFLOXacin  750 MG tablet  Commonly known as: LEVAQUIN   Take 1 tablet by mouth daily for 5 days     meclizine  12.5 MG tablet  Commonly known as: ANTIVERT   Take 1 tablet by mouth 3 times daily as needed for Dizziness     metFORMIN  500 MG extended release tablet  Commonly known as: GLUCOPHAGE -XR     OLANZapine  10 MG tablet  Commonly known as: ZYPREXA   Take 1 tablet by mouth nightly            CHANGE how you take these medications      albuterol  sulfate HFA 108 (90 Base) MCG/ACT inhaler  Commonly known as: Ventolin  HFA  Inhale 2 puffs into the lungs 4 times daily as needed for Wheezing  What changed: Another medication with the same name was removed. Continue taking this medication, and follow the directions you see here.     metoprolol  tartrate 50  MG tablet  Commonly known as: LOPRESSOR   Take 1 tablet by mouth 2 times daily  What changed:   medication strength  how much to take            CONTINUE taking these medications      acetaminophen  325 MG tablet  Commonly known as: TYLENOL   Take 1 tablet by mouth every 6 hours as needed for Pain     Blood Glucose Monitor System w/Device Kit  Pharmacist to identify preferred meter and strips.     blood glucose test strips  Test 1-2 times a day & as needed for symptoms of irregular blood glucose. Dispense sufficient amount for indicated testing frequency plus additional to accommodate PRN testing needs. Pharmacist to identify preferred brand.     chlorthalidone  25 MG tablet  Commonly known as: HYGROTON      guaiFENesin  600 MG extended release tablet  Commonly known as: MUCINEX   Take 1 tablet by mouth 2 times daily     lactobacillus capsule  Take 1 capsule by mouth daily (with breakfast)     Lancets 30G Misc  Test 1-2 times a day & as needed for symptoms of irregular blood glucose. Dispense sufficient amount for indicated testing frequency plus additional to accommodate PRN testing needs. Pharmacist to identify preferred brand.     Melatonin 10 MG Tabs     pantoprazole  40 MG tablet  Commonly known as: PROTONIX   Take 1 tablet by mouth 2 times daily (before meals)     sertraline  100 MG tablet  Commonly known as: ZOLOFT   Take 1 tablet by mouth daily     Trelegy Ellipta  100-62.5-25 MCG/ACT Aepb inhaler  Generic drug: fluticasone -umeclidin-vilant  Inhale 1 puff into the lungs daily            STOP taking these medications      escitalopram  5 MG tablet  Commonly known as: LEXAPRO             ASK your doctor about these medications      ondansetron  4 MG disintegrating tablet  Commonly known as: ZOFRAN -ODT  Take 1 tablet by mouth every 8 hours as needed for Nausea or Vomiting               Where to Get Your Medications        These medications were sent to [facility] - [place], [place] - [address] - P  [phone] [facility] [phone]  [address] [place], [place] [address]      Phone: [phone]   atorvastatin  40 MG tablet  bumetanide  1 MG tablet  levoFLOXacin  750 MG tablet  meclizine  12.5 MG tablet  metoprolol  tartrate 50 MG tablet  OLANZapine  10 MG tablet  Trelegy Ellipta  100-62.5-25 MCG/ACT Aepb inhaler             DISPOSITION:    Home with Family:        Home with HH/PT/OT/RN:                x   SNF/LTC:    [facility]:    OTHER:            Code status:   Recommended diet: diabetic diet  Recommended activity: activity as tolerated  Wound care: None      Follow up with:   PCP : Unknown, Provider  Rubin Sprague, MD  [address]  [place] [address]  [phone]    Schedule an appointment as soon as possible for a visit in 1 week(s)  Follow-up with PCP in 10 to 14 days given recent  hospitalization for continued monitoring and management of medications and other comorbidities.  Obtain repeat chest x-ray in 4 to 6 weeks to ensure resolution of pneumonia.    Mauricio Mancel LABOR, MD  [address] [place] [address]  [phone]    Schedule an appointment as soon as possible for a visit in 1 week(s)  Follow-up with pulmonologist in 1 to 2 weeks for continued monitoring and management of COPD requiring O2 and BiPAP    Punyala, Srinivasa R, MD  [address]  [place] [address]  [phone]    Schedule an appointment as soon as possible for a visit in 1 week(s)  Follow-up with psychiatry in 1 to 2 weeks for continued medication management          Total time in minutes spent coordinating this discharge (includes going over instructions, follow-up, prescriptions, and preparing report for sign off to her PCP) :  35 minutes   "

## 2024-06-16 LAB — POCT GLUCOSE
POC Glucose: 118 mg/dL — ABNORMAL HIGH (ref 65–100)
POC Glucose: 129 mg/dL — ABNORMAL HIGH (ref 65–100)
POC Glucose: 113 mg/dL — ABNORMAL HIGH (ref 65–100)
POC Glucose: 143 mg/dL — ABNORMAL HIGH (ref 65–100)

## 2024-06-16 LAB — BASIC METABOLIC PANEL
Anion Gap: 8 mmol/L (ref 2–14)
BUN/Creatinine Ratio: 19 (ref 12–20)
BUN: 13 mg/dL (ref 8–23)
CO2: 29 mmol/L (ref 20–29)
Calcium: 8.5 mg/dL — ABNORMAL LOW (ref 8.8–10.2)
Chloride: 106 mmol/L (ref 98–107)
Creatinine: 0.7 mg/dL (ref 0.60–1.00)
Est, Glom Filt Rate: >90 ml/min/1.73m2 (ref >59)
Glucose: 123 mg/dL — ABNORMAL HIGH (ref 65–100)
Potassium: 3.4 mmol/L — ABNORMAL LOW (ref 3.5–5.1)
Sodium: 143 mmol/L (ref 136–145)

## 2024-06-16 LAB — CBC
Hematocrit: 32.5 % — ABNORMAL LOW (ref 35.0–47.0)
Hemoglobin: 9.4 g/dL — ABNORMAL LOW (ref 11.5–16.0)
MCH: 24.7 pg — ABNORMAL LOW (ref 26.0–34.0)
MCHC: 28.9 g/dL — ABNORMAL LOW (ref 30.0–36.5)
MCV: 85.5 FL (ref 80.0–99.0)
MPV: 12.6 FL (ref 8.9–12.9)
Nucleated RBCs: 0 /100{WBCs}
Platelets: 88 K/uL — ABNORMAL LOW (ref 150–400)
RBC: 3.8 M/uL (ref 3.80–5.20)
RDW: 17 % — ABNORMAL HIGH (ref 11.5–14.5)
WBC: 5.5 K/uL (ref 3.6–11.0)
nRBC: 0 K/uL (ref 0.00–0.01)

## 2024-06-16 MED ORDER — POTASSIUM CHLORIDE CRYS ER 20 MEQ PO TBCR
20 | Freq: Once | ORAL | Status: AC
Start: 2024-06-16 — End: 2024-06-16
  Administered 2024-06-16: 19:00:00 40 meq via ORAL

## 2024-06-16 MED FILL — SERTRALINE HCL 50 MG PO TABS: 50 mg | ORAL | Qty: 2 | Fill #0

## 2024-06-16 MED FILL — BUDESONIDE 0.5 MG/2ML IN SUSP: 0.5 MG/2ML | RESPIRATORY_TRACT | Qty: 2 | Fill #0

## 2024-06-16 MED FILL — MECLIZINE HCL 12.5 MG PO TABS: 12.5 mg | ORAL | Qty: 1 | Fill #0

## 2024-06-16 MED FILL — ELIQUIS 5 MG PO TABS: 5 mg | ORAL | Qty: 1 | Fill #0

## 2024-06-16 MED FILL — TYLENOL 325 MG PO TABS: 325 mg | ORAL | Qty: 2 | Fill #0

## 2024-06-16 MED FILL — ATORVASTATIN CALCIUM 40 MG PO TABS: 40 mg | ORAL | Qty: 1 | Fill #0

## 2024-06-16 MED FILL — LANTUS 100 UNIT/ML SC SOLN: 100 [IU]/mL | SUBCUTANEOUS | Qty: 10 | Fill #0

## 2024-06-16 MED FILL — MUCUS RELIEF ER 600 MG PO TB12: 600 mg | ORAL | Qty: 1 | Fill #0

## 2024-06-16 MED FILL — IPRATROPIUM-ALBUTEROL 0.5-2.5 (3) MG/3ML IN SOLN: 0.5-2.5 (3) MG/3ML | RESPIRATORY_TRACT | Qty: 3 | Fill #0

## 2024-06-16 MED FILL — METOPROLOL TARTRATE 50 MG PO TABS: 50 mg | ORAL | Qty: 1 | Fill #0

## 2024-06-16 MED FILL — OLANZAPINE 10 MG PO TABS: 10 mg | ORAL | Qty: 1 | Fill #0

## 2024-06-16 MED FILL — POTASSIUM CHLORIDE CRYS ER 20 MEQ PO TBCR: 20 meq | ORAL | Qty: 2 | Fill #0

## 2024-06-16 NOTE — Progress Notes (Signed)
 "  IMPRESSION:   Acute on chronic hypoxic respiratory failure  Chronic hypercapnic respiratory failure  Bilateral pneumonia improved  Cirrhosis of liver  Chronic A-fib      RECOMMENDATIONS/PLAN:   63 year old obese lady came in because of shortness of breath and dyspnea she is chronically on home oxygen she did not have any sleep study done in the past she has history of hypertension chronic A-fib history of EtOH cirrhosis of liver, patient also has COPD she was severely hypoxic and she was put on noninvasive ventilator CAT scan of the chest was done which showed bilateral pneumonia patient was intubated and successfully weaned off and she is using BiPAP at night and oxygen during the daytime  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 CT head was negative  Continue use antibiotic for bilateral pneumonia  Nocturnal CPAP she never had any sleep study done  Prescribe albuterol  inhaler for at home use  Patient has had worsening respiratory failure and is at risk for serious harm or death without appropriate therapy at home which include noninvasive ventilator while patient has an underlying condition of OSA it is not the primary indication for need for NIV patient has previously tried CPAP and BiPAP and has failed to improve his hypercapnia and saturation level.  Since chronic/acute respiratory failure can be prevented with the use of positive pressure ventilation and due to the nature of the patient deteriorating condition a portable volume ventilator has been ordered via mask interface for use at night as well as during the day.  The patient requires continuous access to noninvasive positive pressure ventilation therapy to reduce respiratory distress, minimize hospitalization and to control carbon dioxide retention.  Noninvasive ventilator setting BiPAP at an IPAP of 15-20 cm H2O and EPAP of 3-5 cm H2O, gradually increasing pressures to target an oxygen saturation (SpO2) of 88-92% and improved pH  and PaCO2. Higher IPAP levels (up to 25-30 cm H2O) and higher EPAP may be needed for severe hypercapnia    Continue with the diuretic  Overnight pulse oximetry shows desaturation for 24 minutes qualify for home oxygen she is on noninvasive ventilator using it every night and daytime as needed     10/22  Alert awake eating breakfast on oxygen via nasal cannula getting nebulizer treatment use the noninvasive ventilator BiPAP her pCO2 was 60 discussed with her about getting noninvasive ventilator will order one but issue is the compliance, she had a history of polysubstance abuse and will do 6-minute walk test before discharge to see if she qualifies for POC.  10/23  Patient seen and examined at the bedside getting nebulizer treatment RT was present in the room denies any chest pain shortness of breath awaiting for noninvasive ventilator which has been ordered discussed with her about her condition and using inhalers and compliance with the noninvasive ventilator once patient get the noninvasive ventilator she can be discharged patient getting nebulizer treatment also on doxycycline .  Culture positive most likely contamination continue with the Bumex .  Recommend abstinence from substance abuse.  10/24   Patient was alert and awake at room air. She is tolerating a full diet. She denies any chest pain, shortness of breath. She is awaiting her noninvasive ventilator which has been ordered. Discussed inhaler use and compliance with the ventilator upon discharge. Continue with the bumex . Recommend abstinence from substance abuse.  Okay to discharge  10/25  Patient was alert and awake using her mask this morning. She says that her NIV machine has  helped a lot. She denies any chest pain or shortness of breath today. She needs a prescription for her albuterol  rescue inhaler upon discharge. Continue with bumex . Recommend abstinence from substance abuse. Okay to discharge.  Asking for albuterol  inhaler as an  outpatient.  10/26  She was sleeping comfortably.  Last ABG shows pCO2 of 57 pO2 of 78 pH well compensated discussed with her about the compliance continue to use the NIV which was ordered and signed for the DME company   10/27  She was awake and alert on O2 nasal canula. She is doing much better overall. She reports sleeping much better on her NIV and does not wake up in the middle of the night anymore. She is pleasant and waiting for discharge.   [x]  High complexity decision making was performed  [x]  See my orders for details    PMH:  has a past medical history of Anxiety, Depression, Gastrointestinal disorder, Heart failure (HCC), Hyperlipidemia, Hypertension, Liver disease, and Pneumonia.    PSH:   has a past surgical history that includes Cataract removal; egd transoral biopsy single/multiple (05/17/2009); Dilation and curettage of uterus (1999); Hysterectomy (2008); Cesarean section (1986 and 1993); Adenoidectomy (1972); Tonsillectomy (1972); back surgery; and Upper gastrointestinal endoscopy (N/A, 04/17/2024).     FHX: family history includes Cancer in her mother; Heart Disease in her father.     SHX:  reports that she quit smoking about 16 years ago. Her smoking use included cigarettes. She has never used smokeless tobacco. She reports that she does not drink alcohol and does not use drugs.    ALL:   Allergies   Allergen Reactions    Latex Rash        MEDS:   [x]  Reviewed - As Below   []  Not reviewed    Current Facility-Administered Medications   Medication Dose Route Frequency Provider Last Rate Last Admin    meclizine  (ANTIVERT ) tablet 12.5 mg  12.5 mg Oral TID PRN Gillard, Madison A, PA-C        ipratropium 0.5 mg-albuterol  2.5 mg (DUONEB ) nebulizer solution 1 Dose  1 Dose Inhalation BID RT Aimee Elspeth SAUNDERS, MD   1 Dose at 06/16/24 0734    guaiFENesin  (MUCINEX ) extended release tablet 600 mg  600 mg Oral BID Ronn Dixon A, PA-C   600 mg at 06/16/24 9142    benzonatate  (TESSALON ) capsule 100 mg  100 mg  Oral TID PRN Ronn Dixon A, PA-C   100 mg at 06/10/24 1217    influenza tiss-cult vaccine (FLUCELVAX) injection 0.5 mL  1 Dose IntraMUSCular Prior to discharge Aimee Elspeth SAUNDERS, MD        metoprolol  tartrate (LOPRESSOR ) tablet 50 mg  50 mg Oral BID Azeem, Ahad, MD   50 mg at 06/16/24 0857    OLANZapine  (ZYPREXA ) tablet 10 mg  10 mg Oral Nightly Punyala, Srinivasa R, MD   10 mg at 06/15/24 2247    atorvastatin  (LIPITOR ) tablet 40 mg  40 mg Oral Nightly Azeem, Ahad, MD   40 mg at 06/15/24 2244    haloperidol  lactate (HALDOL ) injection 5 mg  5 mg IntraVENous Q6H PRN Azeem, Ahad, MD   5 mg at 06/08/24 0438    budesonide  (PULMICORT ) nebulizer suspension 500 mcg  0.5 mg Nebulization BID RT Nunna, Krishidhar R, MD   500 mcg at 06/16/24 0734    insulin  glargine (LANTUS ) injection vial 10 Units  10 Units SubCUTAneous Daily Nunna, Krishidhar R, MD   10 Units at 06/16/24  0855    ipratropium 0.5 mg-albuterol  2.5 mg (DUONEB ) nebulizer solution 1 Dose  1 Dose Inhalation Q4H PRN Nunna, Krishidhar R, MD   1 Dose at 06/08/24 0304    insulin  lispro (HUMALOG ,ADMELOG ) injection vial 0-8 Units  0-8 Units SubCUTAneous Q4H Nunna, Krishidhar R, MD   2 Units at 06/14/24 0043    0.9 % sodium chloride  infusion   IntraVENous PRN Nunna, Krishidhar R, MD        albuterol  (PROVENTIL ) nebulizer solution 2.5 mg  2.5 mg Nebulization Q4H PRN Nunna, Krishidhar R, MD        [Held by provider] tiotropium bromide  (SPIRIVA  RESPIMAT) 2.5 MCG/ACT inhaler 2 puff  2 puff Inhalation Daily RT Nunna, Krishidhar R, MD        [Held by provider] budesonide -formoterol  (SYMBICORT ) 160-4.5 MCG/ACT inhaler 2 puff  2 puff Inhalation BID RT Nunna, Krishidhar R, MD        hydrALAZINE  (APRESOLINE ) injection 10 mg  10 mg IntraVENous Q4H PRN Sahib, Taaliba A, APRN - NP   10 mg at 06/07/24 0202    glucose chewable tablet 16 g  4 tablet Oral PRN Nunna, Krishidhar R, MD   16 g at 06/12/24 0245    dextrose  bolus 10% 125 mL  125 mL IntraVENous PRN Nunna, Krishidhar R, MD         Or    dextrose  bolus 10% 250 mL  250 mL IntraVENous PRN Nunna, Krishidhar R, MD        glucagon  injection 1 mg  1 mg SubCUTAneous PRN Nunna, Krishidhar R, MD        dextrose  10 % infusion   IntraVENous Continuous PRN Nunna, Krishidhar R, MD        sertraline  (ZOLOFT ) tablet 100 mg  100 mg Oral Daily Nunna, Krishidhar R, MD   100 mg at 06/16/24 0856    apixaban  (ELIQUIS ) tablet 5 mg  5 mg Per NG tube BID Nunna, Krishidhar R, MD   5 mg at 06/16/24 0857    sodium chloride  flush 0.9 % injection 5-40 mL  5-40 mL IntraVENous 2 times per day Marvis Lynwood HERO, APRN - CNP   10 mL at 06/16/24 0859    sodium chloride  flush 0.9 % injection 5-40 mL  5-40 mL IntraVENous PRN Marvis Lynwood HERO, APRN - CNP        0.9 % sodium chloride  infusion   IntraVENous PRN Marvis Lynwood HERO, APRN - CNP        lidocaine  1 % injection 50 mg  50 mg IntraDERmal Once Marvis Lynwood HERO, APRN - CNP        ALTEplase  (CATHFLO) injection 2 mg  2 mg IntraCATHeter PRN Marvis Lynwood HERO, APRN - CNP        sodium chloride  flush 0.9 % injection 5-40 mL  5-40 mL IntraVENous PRN Marvis Lynwood HERO, APRN - CNP        0.9 % sodium chloride  infusion   IntraVENous PRN Marvis Lynwood HERO, APRN - CNP   Stopped at 06/05/24 2344    potassium chloride  20 mEq/50 mL IVPB (Central Line)  20 mEq IntraVENous PRN Marvis Lynwood HERO, APRN - CNP        Or    potassium chloride  10 mEq/100 mL IVPB (Peripheral Line)  10 mEq IntraVENous PRN Marvis Lynwood HERO, APRN - CNP        magnesium  sulfate 2000 mg in 50 mL IVPB premix  2,000 mg IntraVENous PRN Marvis Lynwood HERO, APRN - CNP  ondansetron  (ZOFRAN -ODT) disintegrating tablet 4 mg  4 mg Oral Q8H PRN Gill, James M, APRN - CNP        Or    ondansetron  (ZOFRAN ) injection 4 mg  4 mg IntraVENous Q6H PRN Marvis Lynwood HERO, APRN - CNP   4 mg at 06/07/24 1021    polyethylene glycol (GLYCOLAX ) packet 17 g  17 g Oral Daily PRN Marvis Lynwood HERO, APRN - CNP        acetaminophen  (TYLENOL ) tablet 650 mg  650 mg Oral Q6H PRN Marvis Lynwood HERO, APRN - CNP   650 mg at 06/15/24 2249    Or     acetaminophen  (TYLENOL ) suppository 650 mg  650 mg Rectal Q6H PRN Marvis Lynwood HERO, APRN - CNP            MAR reviewed and pertinent medications noted or modified as needed      MND:Ezmupwzwu items are noted in HPI.      Hemodynamics:    CO:    CI:    CVP:    SVR:   PAP Systolic:    PAP Diastolic:    PVR:    SV02:        Ventilator Settings:      Mode Rate TV Press PEEP FiO2 PIP Min. Vent   (S) CPAP/PS 16 bpm  380 mL    6 45 %  13 cmH2O           Vital Signs: Telemetry:    AFIB Intake/Output:   BP (!) 112/58 Comment: Will RN was notified  Pulse 58   Temp 97.9 F (36.6 C) (Oral)   Resp 10   Ht 1.575 m (5' 2)   Wt 116.5 kg (256 lb 13.4 oz)   SpO2 100%   BMI 46.96 kg/m     Temp (24hrs), Avg:98.2 F (36.8 C), Min:97.9 F (36.6 C), Max:98.8 F (37.1 C)        O2 Device: None (Room air) O2 Flow Rate (L/min): 3 L/min       Wt Readings from Last 4 Encounters:   06/16/24 116.5 kg (256 lb 13.4 oz)   06/06/24 126.6 kg (279 lb 1.6 oz)   05/03/24 129.7 kg (286 lb)   05/05/24 129.7 kg (286 lb)          Intake/Output Summary (Last 24 hours) at 06/16/2024 0908  Last data filed at 06/15/2024 2347  Gross per 24 hour   Intake 400 ml   Output --   Net 400 ml       Last shift:      No intake/output data recorded.  Last 3 shifts: 10/25 1901 - 10/27 0700  In: 500.6 [P.O.:300]  Out: -        Physical Exam:     General: Caucasian female; alert awake on oxygen via nasal cannula  HEENT: NCAT, poor dentition, lips and mucosa dry  Eyes: anicteric; conjunctiva clear  Neck: no nodes, trach midline; no accessory MM use.  Chest: no deformity,  Cardiac: IR regular; no murmur;   Lungs: distant breath sounds; bilateral rales  Abd: soft, NT, hypoactive BS  Ext: Bilateral leg edema; no joint swelling; No clubbing  GU: NO foley, clear urine  Neuro: No focal deficit  Psych- no agitation, oriented to person;   Skin: warm, dry, no cyanosis;   Pulses: 1-2+ Bilateral pedal, radial  Capillary: brisk; pale      DATA:  No results found for this or any  previous visit.  05/02/24    ECHO (TTE) COMPLETE (PRN CONTRAST/BUBBLE/STRAIN/3D) 05/06/2024 10:52 AM (Final)    Interpretation Summary    Left Ventricle: Normal left ventricular systolic function with a visually estimated EF of 70 - 75%. Left ventricle size is normal. Normal wall thickness. Normal wall motion. Normal diastolic function.    Right Ventricle: Right ventricle is moderately dilated. Normal systolic function.    Right Atrium: Right atrium is mildly dilated.    Pericardium: Moderate (1-2 cm) localized pericardial effusion present around the left ventricle. Pericardial posterior effusion measures 1.0 cm.    Image quality is adequate. Contrast used: Lumason . Technically difficult study, technically difficult study with poor endocardial visualization, technically difficult study due to patient's body habitus and technically difficult study due to patient's heart rhythm.    Signed by: Laquetta Commander, MD on 05/06/2024 10:52 AM       MAR reviewed and pertinent medications noted or modified as needed    MEDS:   Current Facility-Administered Medications   Medication Dose Route Frequency Provider Last Rate Last Admin    meclizine  (ANTIVERT ) tablet 12.5 mg  12.5 mg Oral TID PRN Ronn, Madison A, PA-C        ipratropium 0.5 mg-albuterol  2.5 mg (DUONEB ) nebulizer solution 1 Dose  1 Dose Inhalation BID RT Aimee Elspeth SAUNDERS, MD   1 Dose at 06/16/24 0734    guaiFENesin  (MUCINEX ) extended release tablet 600 mg  600 mg Oral BID Ronn Dixon A, PA-C   600 mg at 06/16/24 9142    benzonatate  (TESSALON ) capsule 100 mg  100 mg Oral TID PRN Ronn Dixon A, PA-C   100 mg at 06/10/24 1217    influenza tiss-cult vaccine (FLUCELVAX) injection 0.5 mL  1 Dose IntraMUSCular Prior to discharge Aimee Elspeth SAUNDERS, MD        metoprolol  tartrate (LOPRESSOR ) tablet 50 mg  50 mg Oral BID Azeem, Ahad, MD   50 mg at 06/16/24 0857    OLANZapine  (ZYPREXA ) tablet 10 mg  10 mg Oral Nightly Punyala, Srinivasa R, MD   10 mg at 06/15/24  2247    atorvastatin  (LIPITOR ) tablet 40 mg  40 mg Oral Nightly Azeem, Ahad, MD   40 mg at 06/15/24 2244    haloperidol  lactate (HALDOL ) injection 5 mg  5 mg IntraVENous Q6H PRN Azeem, Ahad, MD   5 mg at 06/08/24 0438    budesonide  (PULMICORT ) nebulizer suspension 500 mcg  0.5 mg Nebulization BID RT Nunna, Krishidhar R, MD   500 mcg at 06/16/24 0734    insulin  glargine (LANTUS ) injection vial 10 Units  10 Units SubCUTAneous Daily Nunna, Krishidhar R, MD   10 Units at 06/16/24 0855    ipratropium 0.5 mg-albuterol  2.5 mg (DUONEB ) nebulizer solution 1 Dose  1 Dose Inhalation Q4H PRN Nunna, Krishidhar R, MD   1 Dose at 06/08/24 0304    insulin  lispro (HUMALOG ,ADMELOG ) injection vial 0-8 Units  0-8 Units SubCUTAneous Q4H Nunna, Krishidhar R, MD   2 Units at 06/14/24 0043    0.9 % sodium chloride  infusion   IntraVENous PRN Nunna, Krishidhar R, MD        albuterol  (PROVENTIL ) nebulizer solution 2.5 mg  2.5 mg Nebulization Q4H PRN Nunna, Krishidhar R, MD        [Held by provider] tiotropium bromide  (SPIRIVA  RESPIMAT) 2.5 MCG/ACT inhaler 2 puff  2 puff Inhalation Daily RT Nunna, Krishidhar R, MD        [Held by provider] budesonide -formoterol  (SYMBICORT ) 160-4.5 MCG/ACT inhaler 2 puff  2  puff Inhalation BID RT Nunna, Krishidhar R, MD        hydrALAZINE  (APRESOLINE ) injection 10 mg  10 mg IntraVENous Q4H PRN Sahib, Taaliba A, APRN - NP   10 mg at 06/07/24 0202    glucose chewable tablet 16 g  4 tablet Oral PRN Rawland Veleta SAUNDERS, MD   16 g at 06/12/24 0245    dextrose  bolus 10% 125 mL  125 mL IntraVENous PRN Nunna, Krishidhar R, MD        Or    dextrose  bolus 10% 250 mL  250 mL IntraVENous PRN Rawland, Krishidhar R, MD        glucagon  injection 1 mg  1 mg SubCUTAneous PRN Nunna, Krishidhar R, MD        dextrose  10 % infusion   IntraVENous Continuous PRN Nunna, Krishidhar R, MD        sertraline  (ZOLOFT ) tablet 100 mg  100 mg Oral Daily Nunna, Krishidhar R, MD   100 mg at 06/16/24 0856    apixaban  (ELIQUIS ) tablet 5 mg  5 mg  Per NG tube BID Nunna, Krishidhar R, MD   5 mg at 06/16/24 0857    sodium chloride  flush 0.9 % injection 5-40 mL  5-40 mL IntraVENous 2 times per day Marvis Lynwood HERO, APRN - CNP   10 mL at 06/16/24 0859    sodium chloride  flush 0.9 % injection 5-40 mL  5-40 mL IntraVENous PRN Marvis Lynwood HERO, APRN - CNP        0.9 % sodium chloride  infusion   IntraVENous PRN Marvis Lynwood HERO, APRN - CNP        lidocaine  1 % injection 50 mg  50 mg IntraDERmal Once Marvis Lynwood HERO, APRN - CNP        ALTEplase  (CATHFLO) injection 2 mg  2 mg IntraCATHeter PRN Marvis Lynwood HERO, APRN - CNP        sodium chloride  flush 0.9 % injection 5-40 mL  5-40 mL IntraVENous PRN Marvis Lynwood HERO, APRN - CNP        0.9 % sodium chloride  infusion   IntraVENous PRN Marvis Lynwood HERO, APRN - CNP   Stopped at 06/05/24 2344    potassium chloride  20 mEq/50 mL IVPB (Central Line)  20 mEq IntraVENous PRN Marvis Lynwood HERO, APRN - CNP        Or    potassium chloride  10 mEq/100 mL IVPB (Peripheral Line)  10 mEq IntraVENous PRN Marvis Lynwood HERO, APRN - CNP        magnesium  sulfate 2000 mg in 50 mL IVPB premix  2,000 mg IntraVENous PRN Marvis Lynwood HERO, APRN - CNP        ondansetron  (ZOFRAN -ODT) disintegrating tablet 4 mg  4 mg Oral Q8H PRN Marvis Lynwood HERO, APRN - CNP        Or    ondansetron  (ZOFRAN ) injection 4 mg  4 mg IntraVENous Q6H PRN Marvis Lynwood HERO, APRN - CNP   4 mg at 06/07/24 1021    polyethylene glycol (GLYCOLAX ) packet 17 g  17 g Oral Daily PRN Marvis Lynwood HERO, APRN - CNP        acetaminophen  (TYLENOL ) tablet 650 mg  650 mg Oral Q6H PRN Marvis Lynwood HERO, APRN - CNP   650 mg at 06/15/24 2249    Or    acetaminophen  (TYLENOL ) suppository 650 mg  650 mg Rectal Q6H PRN Marvis Lynwood HERO, APRN - CNP  ARTERIAL BLOOD GAS  No results for input(s): PHART, PCO2ART, PO2ART, HCO3ART, BEART, TCO2ARRT, HGBART, PO2CORART, FIO2A, O2SATART, OXYHEM, CARBOXHGBART, METHGBART, O2CONTART, PHCORART, TEMP in the last 72 hours.    Invalid input(s):  PCO2COART    Labs:    Recent Labs     06/14/24  1010 06/16/24  0448   WBC 6.3 5.5   HGB 10.1* 9.4*   PLT 97* 88*     Recent Labs     06/14/24  1010 06/15/24  1208 06/16/24  0448   NA 143 143 143   K 3.3* 3.3* 3.4*   CL 102 106 106   CO2 34* 30* 29   GLUCOSE 145* 113* 123*   BUN 13 12 13    CREATININE 0.66 0.62 0.70   CALCIUM  8.9 8.5* 8.5*     No results for input(s): CKTOTAL, CKMB, CKMBINDEX, TROPONINI in the last 72 hours.  Lab Results   Component Value Date/Time    PROBNP 11,914 06/03/2024 05:54 PM      Lab Results   Component Value Date/Time    TSH 2.400 06/09/2024 01:50 PM      Results       Procedure Component Value Units Date/Time    Culture, Blood 1 [[id number]] Collected: 06/09/24 1420    Order Status: Completed Specimen: Blood Updated: 06/15/24 1503     Special Requests No Special Requests        Culture No growth 6 days       Legionella antigen, urine [[id number]] Collected: 06/04/24 1228    Order Status: Completed Specimen: Urine Updated: 06/06/24 1636     Sample Site Urine        L pneumophila S1 Ag, Ur Negative        Comment: Presumptive negative for L. pneumophila serogroup 1 antigen in urine,  suggesting no recent or current infection. Legionnaires' disease  cannot be ruled out since other serogroups and species may also  cause disease.  Performed At: [facility]  [address] [place], [place] [id number]  Jennette Shorter MD Ey:[phone]         S. pneumonia Antigen, Urine/CSF [[id number]] Collected: 06/04/24 1228    Order Status: Completed Specimen: Urine Updated: 06/06/24 1636     Sample Site Urine        Specimen Urine     S pneumoniae Ag, CSF Negative        FLUID CULTURE Not indicated.     Organism Id Not indicated.     Please note: Comment        Comment: College of [employer]ologists standards require a culture to be  performed on CSF specimens submitted for bacterial antigen testing.  (CAP [id number]) Urine specimens will not be cultured.  Performed At: [facility]  [address] [place], [place] [id number]  Jennette Shorter MD Ey:[phone]         COVID-19 & Influenza Combo [[id number]] Collected: 06/04/24 0845    Order Status: Completed Specimen: Nasopharynx Updated: 06/04/24 0934     SARS-CoV-2, PCR Not Detected        Comment: Not Detected results do not preclude SARS-CoV-2 infection and should not be used as the sole basis for patient management decisions. Results must be combined with clinical observations, patient history, and epidemiological information        Rapid Influenza A By PCR Not Detected        Rapid Influenza B By PCR Not Detected        Comment:  Testing was performed using cobas Liat SARS-CoV-2 and Influenza A/B nucleic acid assay.  This test is a multiplex Real-Time Reverse Transcriptas         Culture, Blood 2 [[id number]]  (Abnormal) Collected: 06/04/24 0300    Order Status: Completed Specimen: Blood Updated: 06/06/24 0836     Special Requests No Special Requests        Culture       Staphylococcus species, coagulase negative growing in 1 of 2 bottles drawn No Site Indicated                  This isolate represents a common contaminant and may or may not be clinically significant. Please notify the laboratory if further work is indicated.            (NOTE) CALLED TO ;CAMBPELL IRBY RN@[email or web address] .AM    Culture, Blood, PCR ID Panel [[id number]]  (Abnormal) Collected: 06/04/24 0300    Order Status: Completed Specimen: Blood Updated: 06/05/24 1622     Accession Number Blood        Enterococcus faecalis by PCR Not detected     Enterococcus faecium by PCR Not detected     Listeria monocytogenes by PCR Not detected     STAPHYLOCOCCUS Detected        Staphylococcus Aureus Not detected     Staphylococcus epidermidis by PCR Detected        Staphylococcus lugdunensis by PCR Not detected     STREPTOCOCCUS Not detected     Streptococcus agalactiae (Group B) Not detected     Strep pneumoniae Not detected     Strep pyogenes,(Grp. A) Not detected     Acinetobacter  calcoac baumannii complex by PCR Not detected     Bacteroides fragilis by PCR Not detected     Enterobacteriaceae by PCR Not detected     Enterobacter cloacae complex by PCR Not detected     Escherichia Coli Not detected     Klebsiella aerogenes by PCR Not detected     Klebsiella oxytoca by PCR Not detected     Klebsiella pneumoniae group by PCR Not detected     Proteus by PCR Not detected     Salmonella species by PCR Not detected     Serratia marcescens by PCR Not detected     Haemophilus Influenzae by PCR Not detected     Neisseria meningitidis by PCR Not detected     Pseudomonas aeruginosa Not detected     Stenotrophomonas maltophilia by PCR Not detected     Candida albicans by PCR Not detected     Candida auris by PCR Not detected     Candida glabrata Not detected     Candida krusei by PCR Not detected     Candida parapsilosis by PCR Not detected     Candida tropicalis by PCR Not detected     Cryptococcus neoformans/gattii by PCR Not detected     Resistant gene targets          Methicillin Resistance mecA/C  by PCR Detected        [employer] test comment False positive results may rarely occur. Correlate with     Comment: clinical,epidemiologic,  and other laboratory findings  Please see BCID Interpretation Guide in EPIC Links         MRSA by PCR [[id number]] Collected: 06/04/24 0215    Order Status: Completed Specimen: Swab Updated: 06/04/24 0443     MRSA by PCR Not Detected  CultureJAYSON Pean Screen [[id number]] Collected: 06/04/24 0215    Order Status: Completed Specimen: Swab Updated: 06/07/24 1449     Special Requests No Special Requests        Culture No Candida auris present       Culture, Blood 1 [[id number]] Collected: 06/04/24 0124    Order Status: Completed Specimen: Blood Updated: 06/10/24 1431     Special Requests No Special Requests        Culture No growth 6 days       Culture, Urine [[id number]] Collected: 06/03/24 2204    Order Status: Completed Specimen: Urine Updated: 06/05/24 0811      Special Requests --        No Special Requests  Reflexed from [id number]       Culture No Growth (<1000 cfu/mL)              Imaging:  XR CHEST PORTABLE   Final Result      Persistent but improved diffuse bilateral pulmonary infiltrates.         Electronically signed by DARICE COLON      CTA HEAD NECK W CONTRAST   Final Result         1. No acute large vessel arterial occlusion. Mild to moderate atherosclerotic   changes.   2. Pulmonary hypertension.   3. Biapical airspace disease likely infectious/inflammatory.               Electronically signed by Aletha CHRISTELLA Ham      CT BRAIN PERFUSION   Final Result         1. No acute large vessel arterial occlusion. Mild to moderate atherosclerotic   changes.   2. Pulmonary hypertension.   3. Biapical airspace disease likely infectious/inflammatory.               Electronically signed by Aletha CHRISTELLA Ham      CT HEAD WO CONTRAST   Final Result   No acute intracranial findings.         Electronically signed by PAULINE BLANCH      XR CHEST PORTABLE   Final Result   1. No change bilateral pneumonia         Electronically signed by Evalene JINNY Hoit      CT HEAD WO CONTRAST   Final Result      No acute intracranial abnormality.         Electronically signed by Toribio JINNY Musick      CTA CHEST W WO CONTRAST   Final Result   Extensive bilateral pulmonary infiltrates. No evidence of pulmonary embolism.         Electronically signed by DARICE COLON      XR CHEST PORTABLE   Final Result   1. Enlarged cardiac silhouette, worsening airspace disease or pulmonary edema.   No complicating features post intubation            Electronically signed by Susie Brain      XR CHEST PORTABLE   Final Result   Cardiomegaly and mild interstitial pulmonary edema.             Electronically signed by JIMMY HABIB          No results found.               "

## 2024-06-16 NOTE — Progress Notes (Signed)
 "Comprehensive Nutrition Assessment    Type and Reason for Visit:  Reassess    Nutrition Recommendations/Plan:   Continue Current Diet  Encourage PO intakes >50%  Monitor/document wt, BM, PO+ONS% in I/O     Malnutrition Assessment:  Malnutrition Status:  At risk for malnutrition (06/04/24 1141)    Context:  Acute Illness     Findings of the 6 clinical characteristics of malnutrition:  Energy Intake:  Unable to assess  Weight Loss:  Mild weight loss (-4% x 1 month)     Body Fat Loss:  No body fat loss     Muscle Mass Loss:  No muscle mass loss    Fluid Accumulation:  No fluid accumulation    Grip Strength:  Not Performed    Nutrition Assessment:    10/27: visited pt in room for f/u, pt was asleep; pt w/ po intakes documented 51-75% and 76-100%; will continue to monitor; labs and meds reviewed, K low, Calc low, Glucose elevated; pt on statin, lantus , humalog   10/22: visited pt in room for f/u, pt very pleasant, had no f/u questions from diet education y/day; pt reported improving appetite and good intake, no po intake documented; pt reported no n/v/d and didn't want any ONS, felt she was eating enough; will continue to monitor; labs and meds reviewed, pt on statin, lantus , humalog     Nutrition Related Findings:      Wound Type: None   Last BM: 06/15/24  Edema: Right lower extremity, Left lower extremity      RUE Edema: Non-pitting  LUE Edema: +1  RLE Edema: Non-pitting  LLE Edema: Non-pitting    Nutr. Labs:    Lab Results   Component Value Date    CREATININE 0.70 06/16/2024    BUN 13 06/16/2024    NA 143 06/16/2024    K 3.4 (L) 06/16/2024    CL 106 06/16/2024    CO2 29 06/16/2024       Lab Results   Component Value Date/Time    POCGLU 143 06/16/2024 11:26 AM    POCGLU 113 06/16/2024 07:59 AM    POCGLU 129 06/16/2024 03:45 AM    POCGLU 118 06/15/2024 10:43 PM    POCGLU 77 06/15/2024 03:32 PM    POCGLU 106 06/15/2024 11:12 AM        Hemoglobin A1C   Date Value Ref Range Status   04/24/2024 8.2 (H) 4.0 - 5.6 % Final      Comment:     Reference Range  Normal       <5.7%  Prediabetes  5.7-6.4%  Diabetes     >6.4%         Lab Results   Component Value Date/Time    MG 2.0 06/09/2024 02:00 AM       Lab Results   Component Value Date    CALCIUM  8.5 (L) 06/16/2024    PHOS 2.6 06/09/2024       Lab Results   Component Value Date    TRIG 127 04/15/2024       Nutr. Meds:  Scheduled Meds:   ipratropium 0.5 mg-albuterol  2.5 mg  1 Dose Inhalation BID RT    guaiFENesin   600 mg Oral BID    influenza virus vaccine  1 Dose IntraMUSCular Prior to discharge    metoprolol  tartrate  50 mg Oral BID    OLANZapine   10 mg Oral Nightly    atorvastatin   40 mg Oral Nightly    budesonide   0.5 mg Nebulization BID  RT    insulin  glargine  10 Units SubCUTAneous Daily    insulin  lispro  0-8 Units SubCUTAneous Q4H    [Held by provider] tiotropium bromide   2 puff Inhalation Daily RT    [Held by provider] budesonide -formoterol   2 puff Inhalation BID RT    sertraline   100 mg Oral Daily    apixaban   5 mg Per NG tube BID    sodium chloride  flush  5-40 mL IntraVENous 2 times per day    lidocaine  1 % injection  50 mg IntraDERmal Once     Continuous Infusions:   sodium chloride       dextrose       sodium chloride       sodium chloride  Stopped (06/05/24 2344)     PRN Meds: meclizine , benzonatate , haloperidol  lactate, ipratropium 0.5 mg-albuterol  2.5 mg, sodium chloride , albuterol , hydrALAZINE , glucose, dextrose  bolus **OR** dextrose  bolus, glucagon , dextrose , sodium chloride  flush, sodium chloride , ALTEplase , sodium chloride  flush, sodium chloride , potassium chloride  **OR** potassium chloride , magnesium  sulfate, ondansetron  **OR** ondansetron , polyethylene glycol, acetaminophen  **OR** acetaminophen          Current Nutrition Intake & Therapies:    Average Meal Intake: 51-75%, 76-100%  Average Supplements Intake: None Ordered  ADULT DIET; Easy to Chew; 3 carb choices (45 gm/meal)    Anthropometric Measures:  Height: 157.5 cm (5' 2)  Ideal Body Weight (IBW): 110 lbs (50 kg)        Current Body Weight: 119.6 kg (263 lb 10.7 oz), 239.7 % IBW. Weight Source: Bed scale  Current BMI (kg/m2): 48.2  Usual Body Weight: 125 kg (275 lb 9.2 oz) (September 2025)     % Weight Change (Calculated): -4.3  Weight Adjustment For: No Adjustment                 BMI Categories: Obese Class 3 (BMI 40.0 or greater)    Estimated Daily Nutrient Needs:  Energy Requirements Based On: Formula  Weight Used for Energy Requirements: Current  Energy (kcal/day): 1650-2300 kcals (20 kcal/kg, MSJ 1.0/1.0)  Weight Used for Protein Requirements: Current  Protein (g/day): 92-115 gr (0.8-1.0 gr/kg)  Method Used for Fluid Requirements: 1 ml/kcal  Fluid (ml/day): 1600-2300 ml    Nutrition Diagnosis:   No nutrition diagnosis at this time related to cognitive or neurological impairment as evidenced by intake 0-25%    Nutrition Interventions:   Food and/or Nutrient Delivery: Continue Current Diet  Nutrition Education/Counseling: Education/Counseling completed  Coordination of Nutrition Care: Continue to monitor while inpatient       Goals:  Goals: Meet at least 75% of estimated needs, by next RD assessment, prior to discharge  Type of Goal: New goal  Previous Goal Met: Goal(s) Achieved    Nutrition Monitoring and Evaluation:   Behavioral-Environmental Outcomes: None Identified  Food/Nutrient Intake Outcomes: Food and Nutrient Intake  Physical Signs/Symptoms Outcomes: Biochemical Data, Nutrition Focused Physical Findings, Weight, Skin, Fluid Status or Edema    Discharge Planning:    Recommend pursue outpatient nutrition counseling     Aleck Ferrari, RD  Contact: perfectserv    "

## 2024-06-16 NOTE — Plan of Care (Signed)
"    Problem: Nutrition Deficit:  Goal: Optimize nutritional status  06/16/2024 1435 by Hope Fitch, RD  Outcome: Progressing  Flowsheets (Taken 06/09/2024 1011 by Levonne Nest, RD)  Nutrient intake appropriate for improving, restoring, or maintaining nutritional needs:   Assess nutritional status and recommend course of action   Provide specific nutrition education to patient or family as appropriate     "

## 2024-06-16 NOTE — Care Coordination (Signed)
"  Transition of Care Plan:    RUR: 32%  Prior Level of Functioning: N  Disposition: Home with HH and NIV  EDD: 06/16/24  If SNF or IPR: Date Freedom of Choice offered:   Date Freedom of Choice received:   Accepting facility:   Date authorization started with reference number:   Date authorization received and expires:   Follow up appointments: Refer to dc summary  DME needed: NIV   Transportation at discharge: Will take cab  IM/IMM Medicare/Tricare letter given: Yes 10/21 and 10/25  Is patient a Veteran and connected with VA?    If yes, was [employer] transfer form completed and VA notified?   Caregiver Contact: NONE  Discharge Caregiver contacted prior to discharge? N/a  Care Conference needed? N/a  Barriers to discharge: none    Patient cleared to dc. CM successfully setup NIV and is currently working on getting an agency to accept patient for [facility]. Patient will dc to: [facility] [address] [place], [place] [address]  via cab    Clinicals sent to:     [facility] [place]  [facility]  [facility]  [facility]  [facility]  [facility], [facility]  [facility]  [facility], [facility]  [facility]  [facility]  Healthcare at [facility], [facility]  [facility]    Awaiting acceptance at this time.    "

## 2024-06-16 NOTE — Plan of Care (Signed)
"    Problem: Chronic Conditions and Co-morbidities  Goal: Patient's chronic conditions and co-morbidity symptoms are monitored and maintained or improved  Outcome: Progressing     Problem: Discharge Planning  Goal: Discharge to home or other facility with appropriate resources  Outcome: Progressing     Problem: Respiratory - Adult  Goal: Achieves optimal ventilation and oxygenation  Outcome: Progressing     "

## 2024-06-16 NOTE — Plan of Care (Signed)
 Patient continues to work toward goals.

## 2024-06-16 NOTE — Discharge Summary (Signed)
 "                                       Discharge Summary    Name: Breanna Lester  [id number]  Date of Birth: 11/21/1960 (Age: 63 y.o.)   Date of Admission: 06/03/2024  Date of Discharge: 06/16/2024  Attending Physician: Leonidas Shaker, MD    Discharge Diagnosis:   Acute on chronic hypoxic respiratory failure  Hypercapnia  Severe bilateral [employer] pneumonia  COPD exacerbation  Acute metabolic encephalopathy  Polysubstance abuse  A-fib  Microcytic anemia  Mild thrombocytopenia  OSA    Consultations:  IP CONSULT TO PHARMACY  IP CONSULT TO VASCULAR ACCESS TEAM  IP CONSULT TO CASE MANAGEMENT  IP CONSULT TO TELE-NEUROLOGY  IP CONSULT TO VASCULAR ACCESS TEAM  IP CONSULT TO PSYCHIATRY  IP CONSULT TO DIETITIAN  IP CONSULT TO PULMONOLOGY  IP CONSULT TO PULMONOLOGY  IP CONSULT TO CASE MANAGEMENT  IP CONSULT TO CASE MANAGEMENT      Brief Admission History/Reason for Admission Per Rutul DELENA Fairly, MD:   Chest pain and shortness of breath    Brief Hospital Course by Main Problems:   Patient is a 63 year old female the past medical history of chronic hypoxic respiratory failure on 3 L of supplemental oxygen at baseline, COPD, anxiety, depression, A-fib on anticoagulation, hypertension, type II DM, 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  Due to patient's significant hypoxia and workload patient was initiated on BiPAP, but subsequently intubated with no improvement on BiPAP so was transferred to the ICU.  CTA of the chest revealed extensive bilateral pulmonary infiltrates concerning for severe bilateral pneumonia, but negative for PE.  Patient was initiated on IV Rocephin  and azithromycin .  Patient had expanded pulmonary workup with Legionella and strep negative.  Initial blood cultures revealed Staph epidermidis, but likely contaminated and repeat blood cultures with no growth to date.  Patient continue experience improvement and was  successfully extubated and transition back to baseline supplemental oxygen.  Patient completed IV Rocephin  and azithromycin  while inpatient.  However, patient began experiencing increased confusion so psychiatry was consulted and initiated patient on Zoloft  and Zyprexa .  Stroke alert called on 06/06/2024 as patient began experiencing left-sided weakness with complete left hemianopia.  CT of the head without contrast, CTA of the head and neck and brain perfusion with no significant findings.  However, patient was unable to tolerate MRI due to claustrophobia.  Neurology evaluated patient and recommended considering EEG if clinical status worsens otherwise no further stroke workup required at this time.  Patient was transferred to the ICU on 06/09/2024.  However, patient began experiencing increased lethargy and workload with breathing so repeat ABG revealed worsening hypercapnia.  Pulmonology evaluated patient and recommended initiating patient on nightly BiPAP which provided significant treatments to breathing and not resolution of mental status change.  Patient stable on 3 L supple oxygen via nasal cannula and trilogy initiated for discharge, which was delivered on 06/13/2024.  Pulmonology cleared patient for discharge and recommended continued follow-up in outpatient setting for continued monitoring and management.  Also, advised patient follow-up with PCP in 1 to 2 weeks with recent admission for continued monitoring and management of medications and other comorbidities.  Discussed discharge plan with patient, she is agreeable to.  All questions answered and patient be discharged at this time.  However, patient did appeal discharge on 06/14/2024, but will be discharged today and cab was arranged.    Discharge Exam:  Patient seen and examined by me on discharge day.  Pertinent Findings:  Patient Vitals for the past 24 hrs:   BP Temp Temp src Pulse Resp SpO2 Weight   06/16/24 0753 (!) 112/58 97.9 F (36.6 C) Oral  58 18 100 % --   06/16/24 0556 -- -- -- -- -- -- 116.5 kg (256 lb 13.4 oz)   06/16/24 0412 -- -- -- 56 10 96 % --   06/16/24 0345 (!) 109/50 97.9 F (36.6 C) Axillary 52 18 96 % --   06/16/24 0118 -- -- -- 66 19 100 % --   06/15/24 2236 117/65 -- -- 62 -- 100 % --   06/15/24 2215 117/67 98.1 F (36.7 C) Oral 76 18 100 % --   06/15/24 1959 -- -- -- 75 18 97 % --   06/15/24 1513 (!) 118/41 98.8 F (37.1 C) Oral 64 18 -- --       Gen:    Not in distress  Chest: Clear lungs, 3 L of supplemental oxygen via nasal cannula  CVS:   Regular rhythm.  No edema  Abd:  Soft, not distended, not tender    Discharge/Recent Laboratory Results:  Recent Labs     06/16/24  0448   NA 143   K 3.4*   CL 106   CO2 29   BUN 13   CREATININE 0.70   GLUCOSE 123*   CALCIUM  8.5*     Recent Labs     06/16/24  0448   HGB 9.4*   HCT 32.5*   WBC 5.5   PLT 88*       Discharge Medications:     Medication List        START taking these medications      apixaban  5 MG Tabs tablet  Commonly known as: ELIQUIS   Take 1 tablet by mouth 2 times daily     atorvastatin  40 MG tablet  Commonly known as: LIPITOR   Take 1 tablet by mouth nightly     bumetanide  1 MG tablet  Commonly known as: BUMEX   Take 1 tablet by mouth daily     levoFLOXacin  750 MG tablet  Commonly known as: LEVAQUIN   Take 1 tablet by mouth daily for 5 days     meclizine  12.5 MG tablet  Commonly known as: ANTIVERT   Take 1 tablet by mouth 3 times daily as needed for Dizziness     metFORMIN  500 MG extended release tablet  Commonly known as: GLUCOPHAGE -XR     OLANZapine  10 MG tablet  Commonly known as: ZYPREXA   Take 1 tablet by mouth nightly            CHANGE how you take these medications      albuterol  sulfate HFA 108 (90 Base) MCG/ACT inhaler  Commonly known as: Ventolin  HFA  Inhale 2 puffs into the lungs 4 times daily as needed for Wheezing  What changed: Another medication with the same name was removed. Continue taking this medication, and follow the directions you see here.     metoprolol   tartrate 50 MG tablet  Commonly known as: LOPRESSOR   Take 1 tablet by mouth 2 times daily  What changed:   medication strength  how much to take            CONTINUE taking these medications      acetaminophen   325 MG tablet  Commonly known as: TYLENOL   Take 1 tablet by mouth every 6 hours as needed for Pain     Blood Glucose Monitor System w/Device Kit  Pharmacist to identify preferred meter and strips.     blood glucose test strips  Test 1-2 times a day & as needed for symptoms of irregular blood glucose. Dispense sufficient amount for indicated testing frequency plus additional to accommodate PRN testing needs. Pharmacist to identify preferred brand.     chlorthalidone  25 MG tablet  Commonly known as: HYGROTON      guaiFENesin  600 MG extended release tablet  Commonly known as: MUCINEX   Take 1 tablet by mouth 2 times daily     lactobacillus capsule  Take 1 capsule by mouth daily (with breakfast)     Lancets 30G Misc  Test 1-2 times a day & as needed for symptoms of irregular blood glucose. Dispense sufficient amount for indicated testing frequency plus additional to accommodate PRN testing needs. Pharmacist to identify preferred brand.     Melatonin 10 MG Tabs     pantoprazole  40 MG tablet  Commonly known as: PROTONIX   Take 1 tablet by mouth 2 times daily (before meals)     sertraline  100 MG tablet  Commonly known as: ZOLOFT   Take 1 tablet by mouth daily     Trelegy Ellipta  100-62.5-25 MCG/ACT Aepb inhaler  Generic drug: fluticasone -umeclidin-vilant  Inhale 1 puff into the lungs daily            STOP taking these medications      escitalopram  5 MG tablet  Commonly known as: LEXAPRO             ASK your doctor about these medications      ondansetron  4 MG disintegrating tablet  Commonly known as: ZOFRAN -ODT  Take 1 tablet by mouth every 8 hours as needed for Nausea or Vomiting               Where to Get Your Medications        These medications were sent to [facility] [place] [place] - [address]  - P [phone] [address] [phone]  [address] [place], [place] [address]      Phone: [phone]   atorvastatin  40 MG tablet  bumetanide  1 MG tablet  levoFLOXacin  750 MG tablet  meclizine  12.5 MG tablet  metoprolol  tartrate 50 MG tablet  OLANZapine  10 MG tablet  Trelegy Ellipta  100-62.5-25 MCG/ACT Aepb inhaler       DISPOSITION:    Home with Family:       Home with HH/PT/OT/RN: X   SNF/LTC:    SAHR:    OTHER:      Code status: Full  Recommended diet: diabetic diet  Recommended activity: activity as tolerated  Wound care: None    Follow up with:   PCP : Unknown, Provider  Mohiuddin, Karlene, MD  [address]  [place] [address]  [phone]    Schedule an appointment as soon as possible for a visit in 1 week(s)  Follow-up with PCP in 10 to 14 days given recent hospitalization for continued monitoring and management of medications and other comorbidities.  Obtain repeat chest x-ray in 4 to 6 weeks to ensure resolution of pneumonia.  Follow up apt. acheduled for 06/23/24.Pleasebring ID, insurance card, medication list. Pleease arrive at 2:45 to allow time tfor paperwork.    Mauricio Mancel LABOR, MD  [address] [place]  [address]  [phone]    Schedule an appointment as soon as possible for a visit in 1 week(s)  Follow-up with pulmonologist in 1 to 2 weeks for continued monitoring and management of COPD requiring O2 and    Punyala, Srinivasa R, MD  [address]  [place] [address]  [phone]    Schedule an appointment as soon as possible for a visit in 1 week(s)  Follow-up with psychiatry in 1 to 2 weeks for continued medication management. Unable to make follow up apt. Number says they aren't avail. Patient must self schedule.    Total time in minutes spent coordinating this discharge (includes going over instructions, follow-up, prescriptions, and preparing report for sign off to her PCP) :  35 minutes   "

## 2024-06-16 NOTE — Progress Notes (Signed)
 "  IMPRESSION:   Acute on chronic hypoxic respiratory failure  Chronic hypercapnic respiratory failure  Bilateral pneumonia improved  Cirrhosis of liver  Chronic A-fib      RECOMMENDATIONS/PLAN:   63 year old obese lady came in because of shortness of breath and dyspnea she is chronically on home oxygen she did not have any sleep study done in the past she has history of hypertension chronic A-fib history of EtOH cirrhosis of liver, patient also has COPD she was severely hypoxic and she was put on noninvasive ventilator CAT scan of the chest was done which showed bilateral pneumonia patient was intubated and successfully weaned off and she is using BiPAP at night and oxygen during the daytime  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 CT head was negative  Continue use antibiotic for bilateral pneumonia  Nocturnal CPAP she never had any sleep study done  Prescribe albuterol  inhaler for at home use  Patient has had worsening respiratory failure and is at risk for serious harm or death without appropriate therapy at home which include noninvasive ventilator while patient has an underlying condition of OSA it is not the primary indication for need for NIV patient has previously tried CPAP and BiPAP and has failed to improve his hypercapnia and saturation level.  Since chronic/acute respiratory failure can be prevented with the use of positive pressure ventilation and due to the nature of the patient deteriorating condition a portable volume ventilator has been ordered via mask interface for use at night as well as during the day.  The patient requires continuous access to noninvasive positive pressure ventilation therapy to reduce respiratory distress, minimize hospitalization and to control carbon dioxide retention.  Noninvasive ventilator setting BiPAP at an IPAP of 15-20 cm H2O and EPAP of 3-5 cm H2O, gradually increasing pressures to target an oxygen saturation (SpO2) of 88-92% and improved pH  and PaCO2. Higher IPAP levels (up to 25-30 cm H2O) and higher EPAP may be needed for severe hypercapnia    Continue with the diuretic  Overnight pulse oximetry shows desaturation for 24 minutes qualify for home oxygen she is on noninvasive ventilator using it every night and daytime as needed     10/22  Alert awake eating breakfast on oxygen via nasal cannula getting nebulizer treatment use the noninvasive ventilator BiPAP her pCO2 was 60 discussed with her about getting noninvasive ventilator will order one but issue is the compliance, she had a history of polysubstance abuse and will do 6-minute walk test before discharge to see if she qualifies for POC.  10/23  Patient seen and examined at the bedside getting nebulizer treatment RT was present in the room denies any chest pain shortness of breath awaiting for noninvasive ventilator which has been ordered discussed with her about her condition and using inhalers and compliance with the noninvasive ventilator once patient get the noninvasive ventilator she can be discharged patient getting nebulizer treatment also on doxycycline .  Culture positive most likely contamination continue with the Bumex .  Recommend abstinence from substance abuse.  10/24   Patient was alert and awake at room air. She is tolerating a full diet. She denies any chest pain, shortness of breath. She is awaiting her noninvasive ventilator which has been ordered. Discussed inhaler use and compliance with the ventilator upon discharge. Continue with the bumex . Recommend abstinence from substance abuse.  Okay to discharge  10/25  Patient was alert and awake using her mask this morning. She says that her NIV machine has  helped a lot. She denies any chest pain or shortness of breath today. She needs a prescription for her albuterol  rescue inhaler upon discharge. Continue with bumex . Recommend abstinence from substance abuse. Okay to discharge.  Asking for albuterol  inhaler as an  outpatient.  10/26  She was sleeping comfortably.  Last ABG shows pCO2 of 57 pO2 of 78 pH well compensated discussed with her about the compliance continue to use the NIV which was ordered and signed for the DME company   10/27  She was awake and alert on O2 nasal canula. She is doing much better overall. She reports sleeping much better on her NIV and does not wake up in the middle of the night anymore. She is pleasant and waiting for discharge.     [x]  High complexity decision making was performed  [x]  See my orders for details    PMH:  has a past medical history of Anxiety, Depression, Gastrointestinal disorder, Heart failure (HCC), Hyperlipidemia, Hypertension, Liver disease, and Pneumonia.    PSH:   has a past surgical history that includes Cataract removal; egd transoral biopsy single/multiple (05/17/2009); Dilation and curettage of uterus (1999); Hysterectomy (2008); Cesarean section (1986 and 1993); Adenoidectomy (1972); Tonsillectomy (1972); back surgery; and Upper gastrointestinal endoscopy (N/A, 04/17/2024).     FHX: family history includes Cancer in her mother; Heart Disease in her father.     SHX:  reports that she quit smoking about 16 years ago. Her smoking use included cigarettes. She has never used smokeless tobacco. She reports that she does not drink alcohol and does not use drugs.    ALL:   Allergies   Allergen Reactions    Latex Rash        MEDS:   [x]  Reviewed - As Below   []  Not reviewed    Current Facility-Administered Medications   Medication Dose Route Frequency Provider Last Rate Last Admin    meclizine  (ANTIVERT ) tablet 12.5 mg  12.5 mg Oral TID PRN Gillard, Madison A, PA-C        ipratropium 0.5 mg-albuterol  2.5 mg (DUONEB ) nebulizer solution 1 Dose  1 Dose Inhalation BID RT Aimee Elspeth SAUNDERS, MD   1 Dose at 06/16/24 0734    guaiFENesin  (MUCINEX ) extended release tablet 600 mg  600 mg Oral BID Ronn Dixon A, PA-C   600 mg at 06/16/24 9142    benzonatate  (TESSALON ) capsule 100 mg  100  mg Oral TID PRN Ronn Dixon A, PA-C   100 mg at 06/10/24 1217    influenza tiss-cult vaccine (FLUCELVAX) injection 0.5 mL  1 Dose IntraMUSCular Prior to discharge Aimee Elspeth SAUNDERS, MD        metoprolol  tartrate (LOPRESSOR ) tablet 50 mg  50 mg Oral BID Azeem, Ahad, MD   50 mg at 06/16/24 0857    OLANZapine  (ZYPREXA ) tablet 10 mg  10 mg Oral Nightly Punyala, Srinivasa R, MD   10 mg at 06/15/24 2247    atorvastatin  (LIPITOR ) tablet 40 mg  40 mg Oral Nightly Azeem, Ahad, MD   40 mg at 06/15/24 2244    haloperidol  lactate (HALDOL ) injection 5 mg  5 mg IntraVENous Q6H PRN Azeem, Ahad, MD   5 mg at 06/08/24 0438    budesonide  (PULMICORT ) nebulizer suspension 500 mcg  0.5 mg Nebulization BID RT Nunna, Krishidhar R, MD   500 mcg at 06/16/24 0734    insulin  glargine (LANTUS ) injection vial 10 Units  10 Units SubCUTAneous Daily Nunna, Krishidhar R, MD   10 Units  at 06/16/24 0855    ipratropium 0.5 mg-albuterol  2.5 mg (DUONEB ) nebulizer solution 1 Dose  1 Dose Inhalation Q4H PRN Nunna, Krishidhar R, MD   1 Dose at 06/08/24 0304    insulin  lispro (HUMALOG ,ADMELOG ) injection vial 0-8 Units  0-8 Units SubCUTAneous Q4H Nunna, Krishidhar R, MD   2 Units at 06/14/24 0043    0.9 % sodium chloride  infusion   IntraVENous PRN Nunna, Krishidhar R, MD        albuterol  (PROVENTIL ) nebulizer solution 2.5 mg  2.5 mg Nebulization Q4H PRN Nunna, Krishidhar R, MD        [Held by provider] tiotropium bromide  (SPIRIVA  RESPIMAT) 2.5 MCG/ACT inhaler 2 puff  2 puff Inhalation Daily RT Nunna, Krishidhar R, MD        [Held by provider] budesonide -formoterol  (SYMBICORT ) 160-4.5 MCG/ACT inhaler 2 puff  2 puff Inhalation BID RT Nunna, Krishidhar R, MD        hydrALAZINE  (APRESOLINE ) injection 10 mg  10 mg IntraVENous Q4H PRN Sahib, Taaliba A, APRN - NP   10 mg at 06/07/24 0202    glucose chewable tablet 16 g  4 tablet Oral PRN Nunna, Krishidhar R, MD   16 g at 06/12/24 0245    dextrose  bolus 10% 125 mL  125 mL IntraVENous PRN Nunna, Krishidhar R, MD         Or    dextrose  bolus 10% 250 mL  250 mL IntraVENous PRN Nunna, Krishidhar R, MD        glucagon  injection 1 mg  1 mg SubCUTAneous PRN Nunna, Krishidhar R, MD        dextrose  10 % infusion   IntraVENous Continuous PRN Nunna, Krishidhar R, MD        sertraline  (ZOLOFT ) tablet 100 mg  100 mg Oral Daily Nunna, Krishidhar R, MD   100 mg at 06/16/24 0856    apixaban  (ELIQUIS ) tablet 5 mg  5 mg Per NG tube BID Nunna, Krishidhar R, MD   5 mg at 06/16/24 0857    sodium chloride  flush 0.9 % injection 5-40 mL  5-40 mL IntraVENous 2 times per day Marvis Lynwood HERO, APRN - CNP   10 mL at 06/16/24 0859    sodium chloride  flush 0.9 % injection 5-40 mL  5-40 mL IntraVENous PRN Marvis Lynwood HERO, APRN - CNP        0.9 % sodium chloride  infusion   IntraVENous PRN Marvis Lynwood HERO, APRN - CNP        lidocaine  1 % injection 50 mg  50 mg IntraDERmal Once Marvis Lynwood HERO, APRN - CNP        ALTEplase  (CATHFLO) injection 2 mg  2 mg IntraCATHeter PRN Marvis Lynwood HERO, APRN - CNP        sodium chloride  flush 0.9 % injection 5-40 mL  5-40 mL IntraVENous PRN Marvis Lynwood HERO, APRN - CNP        0.9 % sodium chloride  infusion   IntraVENous PRN Marvis Lynwood HERO, APRN - CNP   Stopped at 06/05/24 2344    potassium chloride  20 mEq/50 mL IVPB (Central Line)  20 mEq IntraVENous PRN Marvis Lynwood HERO, APRN - CNP        Or    potassium chloride  10 mEq/100 mL IVPB (Peripheral Line)  10 mEq IntraVENous PRN Marvis Lynwood HERO, APRN - CNP        magnesium  sulfate 2000 mg in 50 mL IVPB premix  2,000 mg IntraVENous PRN Marvis Lynwood HERO, APRN -  CNP        ondansetron  (ZOFRAN -ODT) disintegrating tablet 4 mg  4 mg Oral Q8H PRN Gill, James M, APRN - CNP        Or    ondansetron  (ZOFRAN ) injection 4 mg  4 mg IntraVENous Q6H PRN Marvis Lynwood HERO, APRN - CNP   4 mg at 06/07/24 1021    polyethylene glycol (GLYCOLAX ) packet 17 g  17 g Oral Daily PRN Marvis Lynwood HERO, APRN - CNP        acetaminophen  (TYLENOL ) tablet 650 mg  650 mg Oral Q6H PRN Marvis Lynwood HERO, APRN - CNP   650 mg at 06/15/24 2249    Or     acetaminophen  (TYLENOL ) suppository 650 mg  650 mg Rectal Q6H PRN Marvis Lynwood HERO, APRN - CNP            MAR reviewed and pertinent medications noted or modified as needed      MND:Ezmupwzwu items are noted in HPI.      Hemodynamics:    CO:    CI:    CVP:    SVR:   PAP Systolic:    PAP Diastolic:    PVR:    SV02:        Ventilator Settings:      Mode Rate TV Press PEEP FiO2 PIP Min. Vent   (S) CPAP/PS 16 bpm  380 mL    6 45 %  13 cmH2O           Vital Signs: Telemetry:    AFIB Intake/Output:   BP (!) 112/58 Comment: Will RN was notified  Pulse 58   Temp 97.9 F (36.6 C) (Oral)   Resp 18   Ht 1.575 m (5' 2)   Wt 116.5 kg (256 lb 13.4 oz)   SpO2 100%   BMI 46.96 kg/m     Temp (24hrs), Avg:98.2 F (36.8 C), Min:97.9 F (36.6 C), Max:98.8 F (37.1 C)        O2 Device: None (Room air) O2 Flow Rate (L/min): 3 L/min       Wt Readings from Last 4 Encounters:   06/16/24 116.5 kg (256 lb 13.4 oz)   06/06/24 126.6 kg (279 lb 1.6 oz)   05/03/24 129.7 kg (286 lb)   05/05/24 129.7 kg (286 lb)          Intake/Output Summary (Last 24 hours) at 06/16/2024 0953  Last data filed at 06/15/2024 2347  Gross per 24 hour   Intake 400 ml   Output --   Net 400 ml       Last shift:      No intake/output data recorded.  Last 3 shifts: 10/25 1901 - 10/27 0700  In: 500.6 [P.O.:300]  Out: -        Physical Exam:     General: Caucasian female; alert awake on oxygen via nasal cannula  HEENT: NCAT, poor dentition, lips and mucosa dry  Eyes: anicteric; conjunctiva clear  Neck: no nodes, trach midline; no accessory MM use.  Chest: no deformity,  Cardiac: IR regular; no murmur;   Lungs: distant breath sounds; bilateral rales  Abd: soft, NT, hypoactive BS  Ext: Bilateral leg edema; no joint swelling; No clubbing  GU: NO foley, clear urine  Neuro: No focal deficit  Psych- no agitation, oriented to person;   Skin: warm, dry, no cyanosis;   Pulses: 1-2+ Bilateral pedal, radial  Capillary: brisk; pale      DATA:  No results  found for this or  any previous visit.    05/02/24    ECHO (TTE) COMPLETE (PRN CONTRAST/BUBBLE/STRAIN/3D) 05/06/2024 10:52 AM (Final)    Interpretation Summary    Left Ventricle: Normal left ventricular systolic function with a visually estimated EF of 70 - 75%. Left ventricle size is normal. Normal wall thickness. Normal wall motion. Normal diastolic function.    Right Ventricle: Right ventricle is moderately dilated. Normal systolic function.    Right Atrium: Right atrium is mildly dilated.    Pericardium: Moderate (1-2 cm) localized pericardial effusion present around the left ventricle. Pericardial posterior effusion measures 1.0 cm.    Image quality is adequate. Contrast used: Lumason . Technically difficult study, technically difficult study with poor endocardial visualization, technically difficult study due to patient's body habitus and technically difficult study due to patient's heart rhythm.    Signed by: Laquetta Commander, MD on 05/06/2024 10:52 AM       MAR reviewed and pertinent medications noted or modified as needed    MEDS:   Current Facility-Administered Medications   Medication Dose Route Frequency Provider Last Rate Last Admin    meclizine  (ANTIVERT ) tablet 12.5 mg  12.5 mg Oral TID PRN Ronn, Madison A, PA-C        ipratropium 0.5 mg-albuterol  2.5 mg (DUONEB ) nebulizer solution 1 Dose  1 Dose Inhalation BID RT Aimee Elspeth SAUNDERS, MD   1 Dose at 06/16/24 0734    guaiFENesin  (MUCINEX ) extended release tablet 600 mg  600 mg Oral BID Ronn Dixon A, PA-C   600 mg at 06/16/24 9142    benzonatate  (TESSALON ) capsule 100 mg  100 mg Oral TID PRN Ronn Dixon A, PA-C   100 mg at 06/10/24 1217    influenza tiss-cult vaccine (FLUCELVAX) injection 0.5 mL  1 Dose IntraMUSCular Prior to discharge Aimee Elspeth SAUNDERS, MD        metoprolol  tartrate (LOPRESSOR ) tablet 50 mg  50 mg Oral BID Azeem, Ahad, MD   50 mg at 06/16/24 0857    OLANZapine  (ZYPREXA ) tablet 10 mg  10 mg Oral Nightly Punyala, Srinivasa R, MD   10 mg at  06/15/24 2247    atorvastatin  (LIPITOR ) tablet 40 mg  40 mg Oral Nightly Azeem, Ahad, MD   40 mg at 06/15/24 2244    haloperidol  lactate (HALDOL ) injection 5 mg  5 mg IntraVENous Q6H PRN Azeem, Ahad, MD   5 mg at 06/08/24 0438    budesonide  (PULMICORT ) nebulizer suspension 500 mcg  0.5 mg Nebulization BID RT Nunna, Krishidhar R, MD   500 mcg at 06/16/24 0734    insulin  glargine (LANTUS ) injection vial 10 Units  10 Units SubCUTAneous Daily Nunna, Krishidhar R, MD   10 Units at 06/16/24 0855    ipratropium 0.5 mg-albuterol  2.5 mg (DUONEB ) nebulizer solution 1 Dose  1 Dose Inhalation Q4H PRN Nunna, Krishidhar R, MD   1 Dose at 06/08/24 0304    insulin  lispro (HUMALOG ,ADMELOG ) injection vial 0-8 Units  0-8 Units SubCUTAneous Q4H Nunna, Krishidhar R, MD   2 Units at 06/14/24 0043    0.9 % sodium chloride  infusion   IntraVENous PRN Nunna, Krishidhar R, MD        albuterol  (PROVENTIL ) nebulizer solution 2.5 mg  2.5 mg Nebulization Q4H PRN Nunna, Krishidhar R, MD        [Held by provider] tiotropium bromide  (SPIRIVA  RESPIMAT) 2.5 MCG/ACT inhaler 2 puff  2 puff Inhalation Daily RT Rawland Veleta SAUNDERS, MD        [facility]  by provider] budesonide -formoterol  (SYMBICORT ) 160-4.5 MCG/ACT inhaler 2 puff  2 puff Inhalation BID RT Nunna, Krishidhar R, MD        hydrALAZINE  (APRESOLINE ) injection 10 mg  10 mg IntraVENous Q4H PRN Sahib, Taaliba A, APRN - NP   10 mg at 06/07/24 0202    glucose chewable tablet 16 g  4 tablet Oral PRN Rawland Anton R, MD   16 g at 06/12/24 0245    dextrose  bolus 10% 125 mL  125 mL IntraVENous PRN Nunna, Krishidhar R, MD        Or    dextrose  bolus 10% 250 mL  250 mL IntraVENous PRN Nunna, Krishidhar R, MD        glucagon  injection 1 mg  1 mg SubCUTAneous PRN Nunna, Krishidhar R, MD        dextrose  10 % infusion   IntraVENous Continuous PRN Nunna, Krishidhar R, MD        sertraline  (ZOLOFT ) tablet 100 mg  100 mg Oral Daily Nunna, Krishidhar R, MD   100 mg at 06/16/24 0856    apixaban  (ELIQUIS ) tablet 5 mg   5 mg Per NG tube BID Nunna, Krishidhar R, MD   5 mg at 06/16/24 0857    sodium chloride  flush 0.9 % injection 5-40 mL  5-40 mL IntraVENous 2 times per day Marvis Lynwood HERO, APRN - CNP   10 mL at 06/16/24 0859    sodium chloride  flush 0.9 % injection 5-40 mL  5-40 mL IntraVENous PRN Marvis Lynwood HERO, APRN - CNP        0.9 % sodium chloride  infusion   IntraVENous PRN Marvis Lynwood HERO, APRN - CNP        lidocaine  1 % injection 50 mg  50 mg IntraDERmal Once Marvis Lynwood HERO, APRN - CNP        ALTEplase  (CATHFLO) injection 2 mg  2 mg IntraCATHeter PRN Marvis Lynwood HERO, APRN - CNP        sodium chloride  flush 0.9 % injection 5-40 mL  5-40 mL IntraVENous PRN Marvis Lynwood HERO, APRN - CNP        0.9 % sodium chloride  infusion   IntraVENous PRN Marvis Lynwood HERO, APRN - CNP   Stopped at 06/05/24 2344    potassium chloride  20 mEq/50 mL IVPB (Central Line)  20 mEq IntraVENous PRN Marvis Lynwood HERO, APRN - CNP        Or    potassium chloride  10 mEq/100 mL IVPB (Peripheral Line)  10 mEq IntraVENous PRN Marvis Lynwood HERO, APRN - CNP        magnesium  sulfate 2000 mg in 50 mL IVPB premix  2,000 mg IntraVENous PRN Marvis Lynwood HERO, APRN - CNP        ondansetron  (ZOFRAN -ODT) disintegrating tablet 4 mg  4 mg Oral Q8H PRN Marvis Lynwood HERO, APRN - CNP        Or    ondansetron  (ZOFRAN ) injection 4 mg  4 mg IntraVENous Q6H PRN Marvis Lynwood HERO, APRN - CNP   4 mg at 06/07/24 1021    polyethylene glycol (GLYCOLAX ) packet 17 g  17 g Oral Daily PRN Marvis Lynwood HERO, APRN - CNP        acetaminophen  (TYLENOL ) tablet 650 mg  650 mg Oral Q6H PRN Marvis Lynwood HERO, APRN - CNP   650 mg at 06/15/24 2249    Or    acetaminophen  (TYLENOL ) suppository 650 mg  650 mg Rectal Q6H PRN Marvis Lynwood HERO,  APRN - CNP            ARTERIAL BLOOD GAS  No results for input(s): PHART, PCO2ART, PO2ART, HCO3ART, BEART, TCO2ARRT, HGBART, PO2CORART, FIO2A, O2SATART, OXYHEM, CARBOXHGBART, METHGBART, O2CONTART, PHCORART, TEMP in the last 72 hours.    Invalid input(s):  PCO2COART    Labs:    Recent Labs     06/14/24  1010 06/16/24  0448   WBC 6.3 5.5   HGB 10.1* 9.4*   PLT 97* 88*     Recent Labs     06/14/24  1010 06/15/24  1208 06/16/24  0448   NA 143 143 143   K 3.3* 3.3* 3.4*   CL 102 106 106   CO2 34* 30* 29   GLUCOSE 145* 113* 123*   BUN 13 12 13    CREATININE 0.66 0.62 0.70   CALCIUM  8.9 8.5* 8.5*     No results for input(s): CKTOTAL, CKMB, CKMBINDEX, TROPONINI in the last 72 hours.  Lab Results   Component Value Date/Time    PROBNP 11,914 06/03/2024 05:54 PM      Lab Results   Component Value Date/Time    TSH 2.400 06/09/2024 01:50 PM      Results       Procedure Component Value Units Date/Time    Culture, Blood 1 [[id number]] Collected: 06/09/24 1420    Order Status: Completed Specimen: Blood Updated: 06/15/24 1503     Special Requests No Special Requests        Culture No growth 6 days       Legionella antigen, urine [[id number]] Collected: 06/04/24 1228    Order Status: Completed Specimen: Urine Updated: 06/06/24 1636     Sample Site Urine        L pneumophila S1 Ag, Ur Negative        Comment: Presumptive negative for L. pneumophila serogroup 1 antigen in urine,  suggesting no recent or current infection. Legionnaires' disease  cannot be ruled out since other serogroups and species may also  cause disease.  Performed At: [facility]  [address] [place], [place] [id number]  Jennette Shorter MD Ey:[phone]         S. pneumonia Antigen, Urine/CSF [[id number]] Collected: 06/04/24 1228    Order Status: Completed Specimen: Urine Updated: 06/06/24 1636     Sample Site Urine        Specimen Urine     S pneumoniae Ag, CSF Negative        FLUID CULTURE Not indicated.     Organism Id Not indicated.     Please note: Comment        Comment: College of [employer] Pathologists standards require a culture to be  performed on CSF specimens submitted for bacterial antigen testing.  (CAP [id number]) Urine specimens will not be cultured.  Performed At: [facility]  [address] [place], [place] [id number]  Jennette Shorter MD Ey:[phone]         COVID-19 & Influenza Combo [[id number]] Collected: 06/04/24 0845    Order Status: Completed Specimen: Nasopharynx Updated: 06/04/24 0934     SARS-CoV-2, PCR Not Detected        Comment: Not Detected results do not preclude SARS-CoV-2 infection and should not be used as the sole basis for patient management decisions. Results must be combined with clinical observations, patient history, and epidemiological information        Rapid Influenza A By PCR Not Detected        Rapid Influenza  B By PCR Not Detected        Comment:    Testing was performed using cobas Liat SARS-CoV-2 and Influenza A/B nucleic acid assay.  This test is a multiplex Real-Time Reverse Transcriptas         Culture, Blood 2 [[id number]]  (Abnormal) Collected: 06/04/24 0300    Order Status: Completed Specimen: Blood Updated: 06/06/24 0836     Special Requests No Special Requests        Culture       Staphylococcus species, coagulase negative growing in 1 of 2 bottles drawn No Site Indicated                  This isolate represents a common contaminant and may or may not be clinically significant. Please notify the laboratory if further work is indicated.            (NOTE) CALLED TO ;CAMBPELL IRBY RN@[email or web address] .AM    Culture, Blood, PCR ID Panel [[id number]]  (Abnormal) Collected: 06/04/24 0300    Order Status: Completed Specimen: Blood Updated: 06/05/24 1622     Accession Number Blood        Enterococcus faecalis by PCR Not detected     Enterococcus faecium by PCR Not detected     Listeria monocytogenes by PCR Not detected     STAPHYLOCOCCUS Detected        Staphylococcus Aureus Not detected     Staphylococcus epidermidis by PCR Detected        Staphylococcus lugdunensis by PCR Not detected     STREPTOCOCCUS Not detected     Streptococcus agalactiae (Group B) Not detected     Strep pneumoniae Not detected     Strep pyogenes,(Grp. A) Not detected     Acinetobacter  calcoac baumannii complex by PCR Not detected     Bacteroides fragilis by PCR Not detected     Enterobacteriaceae by PCR Not detected     Enterobacter cloacae complex by PCR Not detected     Escherichia Coli Not detected     Klebsiella aerogenes by PCR Not detected     Klebsiella oxytoca by PCR Not detected     Klebsiella pneumoniae group by PCR Not detected     Proteus by PCR Not detected     Salmonella species by PCR Not detected     Serratia marcescens by PCR Not detected     Haemophilus Influenzae by PCR Not detected     Neisseria meningitidis by PCR Not detected     Pseudomonas aeruginosa Not detected     Stenotrophomonas maltophilia by PCR Not detected     Candida albicans by PCR Not detected     Candida auris by PCR Not detected     Candida glabrata Not detected     Candida krusei by PCR Not detected     Candida parapsilosis by PCR Not detected     Candida tropicalis by PCR Not detected     Cryptococcus neoformans/gattii by PCR Not detected     Resistant gene targets          Methicillin Resistance mecA/C  by PCR Detected        [employer] test comment False positive results may rarely occur. Correlate with     Comment: clinical,epidemiologic,  and other laboratory findings  Please see BCID Interpretation Guide in EPIC Links         MRSA by PCR [[id number]] Collected: 06/04/24 0215    Order Status: Completed Specimen: Swab Updated:  06/04/24 0443     MRSA by PCR Not Detected       Culture, JAYSON Pean Screen [[id number]] Collected: 06/04/24 0215    Order Status: Completed Specimen: Swab Updated: 06/07/24 1449     Special Requests No Special Requests        Culture No Candida auris present       Culture, Blood 1 [[id number]] Collected: 06/04/24 0124    Order Status: Completed Specimen: Blood Updated: 06/10/24 1431     Special Requests No Special Requests        Culture No growth 6 days       Culture, Urine [[id number]] Collected: 06/03/24 2204    Order Status: Completed Specimen: Urine Updated: 06/05/24 0811      Special Requests --        No Special Requests  Reflexed from [id number]       Culture No Growth (<1000 cfu/mL)              Imaging:  XR CHEST PORTABLE   Final Result      Persistent but improved diffuse bilateral pulmonary infiltrates.         Electronically signed by DARICE COLON      CTA HEAD NECK W CONTRAST   Final Result         1. No acute large vessel arterial occlusion. Mild to moderate atherosclerotic   changes.   2. Pulmonary hypertension.   3. Biapical airspace disease likely infectious/inflammatory.               Electronically signed by Aletha CHRISTELLA Ham      CT BRAIN PERFUSION   Final Result         1. No acute large vessel arterial occlusion. Mild to moderate atherosclerotic   changes.   2. Pulmonary hypertension.   3. Biapical airspace disease likely infectious/inflammatory.               Electronically signed by Aletha CHRISTELLA Ham      CT HEAD WO CONTRAST   Final Result   No acute intracranial findings.         Electronically signed by PAULINE BLANCH      XR CHEST PORTABLE   Final Result   1. No change bilateral pneumonia         Electronically signed by Evalene JINNY Hoit      CT HEAD WO CONTRAST   Final Result      No acute intracranial abnormality.         Electronically signed by Toribio JINNY Musick      CTA CHEST W WO CONTRAST   Final Result   Extensive bilateral pulmonary infiltrates. No evidence of pulmonary embolism.         Electronically signed by DARICE COLON      XR CHEST PORTABLE   Final Result   1. Enlarged cardiac silhouette, worsening airspace disease or pulmonary edema.   No complicating features post intubation            Electronically signed by Susie Brain      XR CHEST PORTABLE   Final Result   Cardiomegaly and mild interstitial pulmonary edema.             Electronically signed by JIMMY HABIB          No results found.               "

## 2024-06-16 NOTE — Progress Notes (Signed)
 "Pulmonary and Critical Care progress note    Subjective:     This patient has been seen and evaluated at the request of Dr. Phill    63 year old obese Caucasian lady  I am asked to see for acute respiratory failure with hypoxia and hypercapnia    Patient has a past medical history significant for chronic hypoxic respiratory failure on 3 L nasal cannula secondary to COPD, obstructive sleep apnea on CPAP, anxiety, depression, HTN, A-fib on Eliquis , T2DM, pancreatitis and EtOH cirrhosis     Presented to ED on 06/03/2024 for worsening dyspnea.  In ED, patient severely hypoxic with increased work of breathing which was refractory to BiPAP, requiring intubation.  CTA chest shows extensive bilateral pulmonary infiltrates concerning for severe bilateral pneumonia, however negative for PE.  Started on empiric IV ceftriaxone  and azithromycin .  Respiratory status improved and patient was able to be extubated to BiPAP and further decreased to home dose 3 L NC.  However, patient remained confused so psychiatry was consulted.  Started on Zoloft  and Zyprexa .  Stroke alert was called on 10/17 for left-sided weakness with complete left hemianopia.  CT head, CTA head/neck and CT brain perfusion with no acute findings.  Patient unable to tolerate MRI.  Neurology evaluated, consider EEG if clinical status worsening otherwise no further stroke work up.      Transferred to medical service on 10/20.  I was asked to see the patient to take over her pulmonary management by intensivist Dr. Azeem.    ABGs 06/09/2024 showed:  pH of 7.40, pCO2 60, pO2 81, bicarb 37, saturation 96% on BiPAP with 32% FiO2    Pt with increased work of breathing and lethargy on 10/21 with repeat ABG showing worsening hypercapnia of 60.  Started on nightly BiPAP with significant improvement in breathing and normalization of mental status.     Echocardiogram 05/05/2024 reviewed personally showing:  LVEF 70 to 75%  Dilated RA and RV    Last chest x-ray 06/05/24  reviewed personally showing:  Patient was still intubated at that time  Bilateral infiltrates    Currently on nasal cannula oxygen    06/11/2024:    Patient seen and examined  Overnight events noted    Sitting up in the bed comfortably  Having breakfast  On 3 L nasal cannula oxygen  Improved respiratory status  Much more awake and alert and interactive and answering questions appropriately  Has used BiPAP 16/8, 30% FiO2 overnight  ABGs reviewed showing pH of 7.40, pCO2 57, pO2 78, bicarb 34, saturation 95%    06/12/2024:    Patient seen and examined  Overnight events noted  Sitting up in bed comfortably  Awake and alert  Having breakfast  Seated nasal cannula oxygen  Used BiPAP 14/8, 40% FiO2 overnight  No acute distress  ABGs and imaging reviewed    06/13/2024:    Patient seen and examined  Overnight events noted  Sitting up in bed comfortably  Awake and alert  Says she did not use her BiPAP last night  Noninvasive ventilator has been ordered for the patient to DME company  Gentle diuresis  Admits to improving shortness of breath    06/14/2024  Examined at bedside  Alert and responding appropriately  She is on trilogy auto ventilator.  Used for 4 hours last night  Tolerated well and getting good feel for it    06/15/2024  Patient examined at bedside  Used her trilogy auto ventilator for 4 hours  Felt well  rested and managed to get good sleep with her trilogy auto ventilator  Denies any chest pains or palpitations  06/16/2024  Patient examined at bedside.  Felt even better, using trilogy auto ventilator at night  Slept for more than 5 hours with trilogy auto ventilator  She is about to get discharged home today    Review of Systems:  Pertinent items are noted in HPI.    Past Medical History:   Diagnosis Date    Anxiety     Depression     Gastrointestinal disorder     Heart failure (HCC)     Hyperlipidemia     Hypertension     Liver disease 2009    liver failure    Pneumonia      Past Surgical History:   Procedure  Laterality Date    ADENOIDECTOMY  1972    BACK SURGERY      cement discs following a fall    CATARACT REMOVAL      CESAREAN SECTION  1986 and 1993    DILATION AND CURETTAGE OF UTERUS  1999    EGD TRANSORAL BIOPSY SINGLE/MULTIPLE  05/17/2009         HYSTERECTOMY (CERVIX STATUS UNKNOWN)  2008    TONSILLECTOMY  1972    UPPER GASTROINTESTINAL ENDOSCOPY N/A 04/17/2024    ESOPHAGOGASTRODUODENOSCOPY performed by Madlyn Fendt, MD at [facility] END[facility]COPY      Family History   Problem Relation Age of Onset    Cancer Mother     Heart Disease Father      Social History     Tobacco Use    Smoking status: Former     Current packs/day: 0.00     Types: Cigarettes     Quit date: 02/28/2008     Years since quitting: 16.3    Smokeless tobacco: Never   Substance Use Topics    Alcohol use: Never      Current Facility-Administered Medications   Medication Dose Route Frequency Provider Last Rate Last Admin    meclizine  (ANTIVERT ) tablet 12.5 mg  12.5 mg Oral TID PRN Gillard, Madison A, PA-C        ipratropium 0.5 mg-albuterol  2.5 mg (DUONEB ) nebulizer solution 1 Dose  1 Dose Inhalation BID RT Aimee Elspeth SAUNDERS, MD   1 Dose at 06/16/24 0734    guaiFENesin  (MUCINEX ) extended release tablet 600 mg  600 mg Oral BID Gillard, Madison A, PA-C   600 mg at 06/16/24 9142    benzonatate  (TESSALON ) capsule 100 mg  100 mg Oral TID PRN Ronn Dixon A, PA-C   100 mg at 06/10/24 1217    influenza tiss-cult vaccine (FLUCELVAX) injection 0.5 mL  1 Dose IntraMUSCular Prior to discharge Aimee Elspeth SAUNDERS, MD        metoprolol  tartrate (LOPRESSOR ) tablet 50 mg  50 mg Oral BID Azeem, Ahad, MD   50 mg at 06/16/24 0857    OLANZapine  (ZYPREXA ) tablet 10 mg  10 mg Oral Nightly Punyala, Srinivasa R, MD   10 mg at 06/15/24 2247    atorvastatin  (LIPITOR ) tablet 40 mg  40 mg Oral Nightly Azeem, Ahad, MD   40 mg at 06/15/24 2244    haloperidol  lactate (HALDOL ) injection 5 mg  5 mg IntraVENous Q6H PRN Azeem, Ahad, MD   5 mg at 06/08/24 0438    budesonide  (PULMICORT ) nebulizer  suspension 500 mcg  0.5 mg Nebulization BID RT Nunna, Krishidhar R, MD   500 mcg at 06/16/24 0734  insulin  glargine (LANTUS ) injection vial 10 Units  10 Units SubCUTAneous Daily Nunna, Krishidhar R, MD   10 Units at 06/16/24 0855    ipratropium 0.5 mg-albuterol  2.5 mg (DUONEB ) nebulizer solution 1 Dose  1 Dose Inhalation Q4H PRN Nunna, Krishidhar R, MD   1 Dose at 06/08/24 0304    insulin  lispro (HUMALOG ,ADMELOG ) injection vial 0-8 Units  0-8 Units SubCUTAneous Q4H Nunna, Krishidhar R, MD   2 Units at 06/14/24 0043    0.9 % sodium chloride  infusion   IntraVENous PRN Nunna, Krishidhar R, MD        albuterol  (PROVENTIL ) nebulizer solution 2.5 mg  2.5 mg Nebulization Q4H PRN Nunna, Krishidhar R, MD        [Held by provider] tiotropium bromide  (SPIRIVA  RESPIMAT) 2.5 MCG/ACT inhaler 2 puff  2 puff Inhalation Daily RT Nunna, Krishidhar R, MD        [Held by provider] budesonide -formoterol  (SYMBICORT ) 160-4.5 MCG/ACT inhaler 2 puff  2 puff Inhalation BID RT Nunna, Krishidhar R, MD        hydrALAZINE  (APRESOLINE ) injection 10 mg  10 mg IntraVENous Q4H PRN Sahib, Taaliba A, APRN - NP   10 mg at 06/07/24 0202    glucose chewable tablet 16 g  4 tablet Oral PRN Nunna, Krishidhar R, MD   16 g at 06/12/24 0245    dextrose  bolus 10% 125 mL  125 mL IntraVENous PRN Nunna, Krishidhar R, MD        Or    dextrose  bolus 10% 250 mL  250 mL IntraVENous PRN Nunna, Krishidhar R, MD        glucagon  injection 1 mg  1 mg SubCUTAneous PRN Nunna, Krishidhar R, MD        dextrose  10 % infusion   IntraVENous Continuous PRN Nunna, Krishidhar R, MD        sertraline  (ZOLOFT ) tablet 100 mg  100 mg Oral Daily Nunna, Krishidhar R, MD   100 mg at 06/16/24 0856    apixaban  (ELIQUIS ) tablet 5 mg  5 mg Per NG tube BID Nunna, Krishidhar R, MD   5 mg at 06/16/24 0857    sodium chloride  flush 0.9 % injection 5-40 mL  5-40 mL IntraVENous 2 times per day Marvis Lynwood HERO, APRN - CNP   10 mL at 06/16/24 0859    sodium chloride  flush 0.9 % injection 5-40 mL  5-40  mL IntraVENous PRN Marvis Lynwood HERO, APRN - CNP        0.9 % sodium chloride  infusion   IntraVENous PRN Marvis Lynwood HERO, APRN - CNP        lidocaine  1 % injection 50 mg  50 mg IntraDERmal Once Marvis Lynwood HERO, APRN - CNP        ALTEplase  (CATHFLO) injection 2 mg  2 mg IntraCATHeter PRN Marvis Lynwood HERO, APRN - CNP        sodium chloride  flush 0.9 % injection 5-40 mL  5-40 mL IntraVENous PRN Marvis Lynwood HERO, APRN - CNP        0.9 % sodium chloride  infusion   IntraVENous PRN Marvis Lynwood HERO, APRN - CNP   Stopped at 06/05/24 2344    potassium chloride  20 mEq/50 mL IVPB (Central Line)  20 mEq IntraVENous PRN Marvis Lynwood HERO, APRN - CNP        Or    potassium chloride  10 mEq/100 mL IVPB (Peripheral Line)  10 mEq IntraVENous PRN Marvis Lynwood HERO, APRN - CNP  magnesium  sulfate 2000 mg in 50 mL IVPB premix  2,000 mg IntraVENous PRN Marvis Lynwood HERO, APRN - CNP        ondansetron  (ZOFRAN -ODT) disintegrating tablet 4 mg  4 mg Oral Q8H PRN Marvis Lynwood HERO, APRN - CNP        Or    ondansetron  (ZOFRAN ) injection 4 mg  4 mg IntraVENous Q6H PRN Marvis Lynwood HERO, APRN - CNP   4 mg at 06/07/24 1021    polyethylene glycol (GLYCOLAX ) packet 17 g  17 g Oral Daily PRN Marvis Lynwood HERO, APRN - CNP        acetaminophen  (TYLENOL ) tablet 650 mg  650 mg Oral Q6H PRN Marvis Lynwood HERO, APRN - CNP   650 mg at 06/15/24 2249    Or    acetaminophen  (TYLENOL ) suppository 650 mg  650 mg Rectal Q6H PRN Marvis Lynwood HERO, APRN - CNP              Allergies   Allergen Reactions    Latex Rash           Objective:     Blood pressure (!) 112/58, pulse 58, temperature 97.9 F (36.6 C), temperature source Oral, resp. rate 18, height 1.575 m (5' 2), weight 116.5 kg (256 lb 13.4 oz), SpO2 100%. Temp (24hrs), Avg:98.2 F (36.8 C), Min:97.9 F (36.6 C), Max:98.8 F (37.1 C)      Intake and Output:  Current Shift: No intake/output data recorded.  Last 3 Shifts: 10/25 1901 - 10/27 0700  In: 500.6 [P.O.:300]  Out: -   @INTAKEOUTPUTBRIEF @     Physical Exam:     General: Lying in bed  comfortably, mild respiratory distress.  On nasal cannula oxygen.  Obese  Eye: Reactive, symmetric  Throat and Neck: Supple  Lung: Reduced air entry bilaterally with prolonged exhalation.  Occasional wheezing.  Bilateral crackles   Heart: S1+S2.  No murmurs  Abdomen: soft, non-tender. Bowel sounds normal. No masses; obese  Extremities: No edema  GU: Not done  Skin: No cyanosis  Neurologic: A & O x3.  Grossly nonfocal  Psychiatric: Mildly anxious      Lab/Data Review:      Recent Results (from the past 24 hours)   POCT Glucose    Collection Time: 06/15/24  3:32 PM   Result Value Ref Range    POC Glucose 77 65 - 100 mg/dL    Performed by: Charley Purple    POCT Glucose    Collection Time: 06/15/24 10:43 PM   Result Value Ref Range    POC Glucose 118 (H) 65 - 100 mg/dL    Performed by: Ezzard Colas    POCT Glucose    Collection Time: 06/16/24  3:45 AM   Result Value Ref Range    POC Glucose 129 (H) 65 - 100 mg/dL    Performed by: Ezzard Colas    CBC    Collection Time: 06/16/24  4:48 AM   Result Value Ref Range    WBC 5.5 3.6 - 11.0 K/uL    RBC 3.80 3.80 - 5.20 M/uL    Hemoglobin 9.4 (L) 11.5 - 16.0 g/dL    Hematocrit 67.4 (L) 35.0 - 47.0 %    MCV 85.5 80.0 - 99.0 FL    MCH 24.7 (L) 26.0 - 34.0 PG    MCHC 28.9 (L) 30.0 - 36.5 g/dL    RDW 82.9 (H) 88.4 - 14.5 %    Platelets 88 (L) 150 - 400  K/uL    MPV 12.6 8.9 - 12.9 FL    Nucleated RBCs 0.0 0.0 PER 100 WBC    nRBC 0.00 0.00 - 0.01 K/uL   Basic Metabolic Panel    Collection Time: 06/16/24  4:48 AM   Result Value Ref Range    Sodium 143 136 - 145 mmol/L    Potassium 3.4 (L) 3.5 - 5.1 mmol/L    Chloride 106 98 - 107 mmol/L    CO2 29 20 - 29 mmol/L    Anion Gap 8 2 - 14 mmol/L    Glucose 123 (H) 65 - 100 mg/dL    BUN 13 8 - 23 mg/dL    Creatinine 9.29 9.39 - 1.00 mg/dL    BUN/Creatinine Ratio 19 12 - 20      Est, Glom Filt Rate >90 >59 ml/min/1.84m2    Calcium  8.5 (L) 8.8 - 10.2 mg/dL   POCT Glucose    Collection Time: 06/16/24  7:59 AM   Result Value Ref Range    POC  Glucose 113 (H) 65 - 100 mg/dL    Performed by: Horacio Autumn    POCT Glucose    Collection Time: 06/16/24 11:26 AM   Result Value Ref Range    POC Glucose 143 (H) 65 - 100 mg/dL    Performed by: Horacio Autumn        XR CHEST PORTABLE   Final Result      Persistent but improved diffuse bilateral pulmonary infiltrates.         Electronically signed by DARICE COLON      CTA HEAD NECK W CONTRAST   Final Result         1. No acute large vessel arterial occlusion. Mild to moderate atherosclerotic   changes.   2. Pulmonary hypertension.   3. Biapical airspace disease likely infectious/inflammatory.               Electronically signed by Aletha CHRISTELLA Ham      CT BRAIN PERFUSION   Final Result         1. No acute large vessel arterial occlusion. Mild to moderate atherosclerotic   changes.   2. Pulmonary hypertension.   3. Biapical airspace disease likely infectious/inflammatory.               Electronically signed by Aletha CHRISTELLA Ham      CT HEAD WO CONTRAST   Final Result   No acute intracranial findings.         Electronically signed by PAULINE BLANCH      XR CHEST PORTABLE   Final Result   1. No change bilateral pneumonia         Electronically signed by Evalene JINNY Hoit      CT HEAD WO CONTRAST   Final Result      No acute intracranial abnormality.         Electronically signed by Toribio JINNY Musick      CTA CHEST W WO CONTRAST   Final Result   Extensive bilateral pulmonary infiltrates. No evidence of pulmonary embolism.         Electronically signed by DARICE COLON      XR CHEST PORTABLE   Final Result   1. Enlarged cardiac silhouette, worsening airspace disease or pulmonary edema.   No complicating features post intubation            Electronically signed by Susie Brain      XR CHEST PORTABLE  Final Result   Cardiomegaly and mild interstitial pulmonary edema.             Electronically signed by JIMMY HABIB            Assessment:     1.  Acute on chronic respiratory failure with hypercapnia  2.  Acute on chronic respiratory  failure with hypoxia  3.  Bilateral pneumonia  4.  Acute metabolic encephalopathy  5.  Acute exacerbation of COPD  6.  Obstructive sleep apnea  7.  Liver cirrhosis  8.  Chronic atrial fibrillation  9.  Obesity  10.  Polysubstance abuse  11.  Anemia and thrombocytopenia    Plan:     Patient seen on the medical floor  Being watched here closely    On BiPAP 16/8, 30% FiO2 overnight  Now on 3 L nasal cannula oxygen  Mental status much improved  ABGs showing persistent hypercapnic respiratory failure despite BiPAP which represents failure of BiPAP.   Noninvasive ventilation/trilogy arranged for her.  She used it for 4 hours last night and tolerated it well.     Basic metabolic profile results reviewed, potassium is 3.3 CO2 30  That is showing that her CO2 has improved it was 34 on 10/25    Bilateral pneumonia  Continue broad-spectrum antibiotics  Cultures sent  Expanded pneumonia workup sent  Mostly negative  Further changes in antibiotics based on clinical response and culture results  Last chest x-ray showing extensive bilateral infiltrates  Will repeat chest x-ray this a.m.  Start doxycycline   Underlying COPD  Continue Solu-Medrol   Continue nebulizers  Will need PFTs in outpatient after discharge    Altered mental status likely combination of hypoxia, hypercapnia, polysubstance abuse  UDS positive for amphetamines  It has shown improvement now  Ammonia normal  Psychiatry following and have recommended Xolox and Zyprexa   Monitor neurological status with treatment    Liver cirrhosis  Will need to see GI in outpatient  Stable for now    DVT and GI prophylaxis    Clinically improved  Out of bed to chair with PT OT  Will follow-up with me in outpatient clinic after discharge for PFTs and sleep evaluation  Patient may get discharged to home today   Questions of patient were answered at bedside in detail  Case discussed in detail with RN, RT, and care team including hospitalist  Thank you for involving me in the care of this  patient  I will follow with you closely during hospitalization    Time spent more than 30 minutes in direct patient care with no overlap reviewing results and records, decision making, and answering questions.      Calleen Grates, MD  [facility] (PAT)  06/16/2024  1:15 PM             "

## 2024-07-02 ENCOUNTER — Emergency Department: Admit: 2024-07-03 | Payer: Medicare (Managed Care) | Primary: Diagnostic Radiology

## 2024-07-02 DIAGNOSIS — J441 Chronic obstructive pulmonary disease with (acute) exacerbation: Principal | ICD-10-CM

## 2024-07-02 NOTE — ED Triage Notes (Signed)
"  Pt BIBEMs with cc of SOB, patient was diagnosed with PNA 6 weeks ago and was given antibiotics, pt stated feels like it has returned. When ems arrived at house, pt was satting 88% on room air not wearing her nasal canula. EMS gave DUO neb and 10L and patient came up to 100%  "

## 2024-07-02 NOTE — ED Provider Notes (Signed)
 "[facility]  EMERGENCY DEPARTMENT HISTORY AND PHYSICAL EXAM      Date of evaluation: 07/02/2024  Patient Name: Breanna Lester [birth year]  MRN: [id number]  ED Provider: Deward Nicely, MD   Note Started: 11:09 PM EST 07/02/24  Chief Complaint:   Chief Complaint   Patient presents with    Shortness of Breath       HISTORY OF PRESENT ILLNESS   History Provided By: Patient, only     HPI: Breanna Lester is a 63 y.o. female presents to the emergency department for evaluation of cough shortness of breath.  Patient has a history of alcoholic cirrhosis, COPD, diabetes, CAD, respiratory failure is on 3 L oxygen at home at baseline.  Patient states that she has been treated intermittently over the last several weeks for pneumonia, COPD exacerbation, has been on steroids frequently, antibiotics.  Patient was admitted October 14-27 for pneumonia, respiratory failure, states he has not felt well since discharge.  Denies any recent fevers chills, denies any chest pain, is complaining of dyspnea on exertion, also complaining of severe anxiety.  No lower extremity swelling, no nausea or vomiting.    PAST MEDICAL HISTORY   Past Medical History:  Past Medical History:   Diagnosis Date    Anxiety     Depression     Gastrointestinal disorder     Heart failure (HCC)     Hyperlipidemia     Hypertension     Liver disease 2009    liver failure    Pneumonia        Past Surgical History:  Past Surgical History:   Procedure Laterality Date    ADENOIDECTOMY  1972    BACK SURGERY      cement discs following a fall    CATARACT REMOVAL      CESAREAN SECTION  1986 and 1993    DILATION AND CURETTAGE OF UTERUS  1999    EGD TRANSORAL BIOPSY SINGLE/MULTIPLE  05/17/2009         HYSTERECTOMY (CERVIX STATUS UNKNOWN)  2008    TONSILLECTOMY  1972    UPPER GASTROINTESTINAL ENDOSCOPY N/A 04/17/2024    ESOPHAGOGASTRODUODENOSCOPY performed by Madlyn Fendt, MD at [facility] ENDOSCOPY       Family History:  Family History   Problem Relation Age of Onset     Cancer Mother     Heart Disease Father        Social History:  Social History     Tobacco Use    Smoking status: Former     Current packs/day: 0.00     Types: Cigarettes     Quit date: 02/28/2008     Years since quitting: 16.3    Smokeless tobacco: Never   Vaping Use    Vaping status: Never Used   Substance Use Topics    Alcohol use: Never    Drug use: Never       Allergies:  Allergies   Allergen Reactions    Latex Rash       PCP: Unknown, Provider    Current Meds:   No current facility-administered medications for this encounter.     Current Outpatient Medications   Medication Sig Dispense Refill    sertraline  (ZOLOFT ) 100 MG tablet Take 1 tablet by mouth daily 30 tablet 3    predniSONE  (DELTASONE ) 50 MG tablet Take 1 tablet by mouth daily for 5 days 5 tablet 0    atorvastatin  (LIPITOR ) 40 MG tablet Take 1 tablet  by mouth nightly 30 tablet 0    OLANZapine  (ZYPREXA ) 10 MG tablet Take 1 tablet by mouth nightly 30 tablet 0    metoprolol  tartrate (LOPRESSOR ) 50 MG tablet Take 1 tablet by mouth 2 times daily 60 tablet 0    bumetanide  (BUMEX ) 1 MG tablet Take 1 tablet by mouth daily 30 tablet 0    fluticasone -umeclidin-vilant (TRELEGY ELLIPTA ) 100-62.5-25 MCG/ACT AEPB inhaler Inhale 1 puff into the lungs daily 2 each 1    chlorthalidone  (HYGROTON ) 25 MG tablet Take 1 tablet by mouth daily      metFORMIN  (GLUCOPHAGE -XR) 500 MG extended release tablet Take 1 tablet by mouth daily (with breakfast)      apixaban  (ELIQUIS ) 5 MG TABS tablet Take 1 tablet by mouth 2 times daily 60 tablet 1    guaiFENesin  (MUCINEX ) 600 MG extended release tablet Take 1 tablet by mouth 2 times daily 15 tablet 1    pantoprazole  (PROTONIX ) 40 MG tablet Take 1 tablet by mouth 2 times daily (before meals) (Patient taking differently: Take 1 tablet by mouth daily) 30 tablet 3    Melatonin 10 MG TABS Take 1 tablet by mouth at bedtime      lactobacillus (CULTURELLE) capsule Take 1 capsule by mouth daily (with breakfast) 30 capsule 0    ondansetron   (ZOFRAN -ODT) 4 MG disintegrating tablet Take 1 tablet by mouth every 8 hours as needed for Nausea or Vomiting (Patient not taking: Reported on 06/04/2024) 30 tablet 0    albuterol  sulfate HFA (VENTOLIN  HFA) 108 (90 Base) MCG/ACT inhaler Inhale 2 puffs into the lungs 4 times daily as needed for Wheezing 18 g 0    Blood Glucose Monitoring Suppl (BLOOD GLUCOSE MONITOR SYSTEM) w/Device KIT Pharmacist to identify preferred meter and strips. 1 kit 0    blood glucose monitor strips Test 1-2 times a day & as needed for symptoms of irregular blood glucose. Dispense sufficient amount for indicated testing frequency plus additional to accommodate PRN testing needs. Pharmacist to identify preferred brand. 100 strip 2    Lancets 30G MISC Test 1-2 times a day & as needed for symptoms of irregular blood glucose. Dispense sufficient amount for indicated testing frequency plus additional to accommodate PRN testing needs. Pharmacist to identify preferred brand. 100 each 2    acetaminophen  (TYLENOL ) 325 MG tablet Take 1 tablet by mouth every 6 hours as needed for Pain 40 tablet 0     Facility-Administered Medications Ordered in Other Encounters   Medication Dose Route Frequency Provider Last Rate Last Admin    lidocaine  4 % external patch 1 patch  1 patch TransDERmal NOW Constancia, Troy R, PA-C        cefTRIAXone  (ROCEPHIN ) 1,000 mg in sterile water  10 mL IV syringe  1,000 mg IntraVENous Once Prentice, Troy R, PA-C        azithromycin  (ZITHROMAX ) 500 mg in sodium chloride  0.9 % 250 mL IVPB (Vial2Bag)  500 mg IntraVENous Once Constancia Lani SAUNDERS, PA-C         PHYSICAL EXAM   Physical Exam  Vitals and nursing note reviewed.   Constitutional:       Appearance: She is obese. She is ill-appearing.   HENT:      Head: Normocephalic and atraumatic.      Nose: Nose normal.   Eyes:      Extraocular Movements: Extraocular movements intact.   Cardiovascular:      Rate and Rhythm: Normal rate.   Pulmonary:      Effort:  Pulmonary effort is normal. No  tachypnea.      Breath sounds: Decreased breath sounds present.   Abdominal:      General: Abdomen is flat.      Palpations: Abdomen is soft.      Hernia: A hernia is present.   Musculoskeletal:         General: Normal range of motion.      Cervical back: Normal range of motion.      Right lower leg: Edema present.      Left lower leg: Edema present.   Skin:     General: Skin is warm.   Neurological:      General: No focal deficit present.      Mental Status: She is alert.      Motor: No weakness.      Gait: Gait normal.         SCREENINGS                No data recorded       LAB, EKG AND DIAGNOSTIC RESULTS   Labs:  Recent Results (from the past 12 hours)   D-Dimer, Quantitative    Collection Time: 07/04/24 12:14 PM   Result Value Ref Range    D-Dimer, Quant 0.42 <0.50 ug/ml(FEU)   CBC with Auto Differential    Collection Time: 07/04/24 12:15 PM   Result Value Ref Range    WBC 7.8 3.6 - 11.0 K/uL    RBC 3.69 (L) 3.80 - 5.20 M/uL    Hemoglobin 9.5 (L) 11.5 - 16.0 g/dL    Hematocrit 66.8 (L) 35.0 - 47.0 %    MCV 89.7 80.0 - 99.0 FL    MCH 25.7 (L) 26.0 - 34.0 PG    MCHC 28.7 (L) 30.0 - 36.5 g/dL    RDW 81.4 (H) 88.4 - 14.5 %    Platelets 149 (L) 150 - 400 K/uL    MPV 12.1 8.9 - 12.9 FL    Nucleated RBCs 0.0 0.0 PER 100 WBC    nRBC 0.00 0.00 - 0.01 K/uL    Neutrophils % 60.5 32.0 - 75.0 %    Lymphocytes % 29.3 12.0 - 49.0 %    Monocytes % 7.5 5.0 - 13.0 %    Eosinophils % 1.7 0.0 - 7.0 %    Basophils % 0.6 0.0 - 1.0 %    Immature Granulocytes % 0.4 0 - 0.5 %    Neutrophils Absolute 4.71 1.80 - 8.00 K/UL    Lymphocytes Absolute 2.29 0.80 - 3.50 K/UL    Monocytes Absolute 0.59 0.00 - 1.00 K/UL    Eosinophils Absolute 0.13 0.00 - 0.40 K/UL    Basophils Absolute 0.05 0.00 - 0.10 K/UL    Immature Granulocytes Absolute 0.03 0.00 - 0.04 K/UL    Differential Type Smear Scanned      RBC Comment Anisocytosis  1+        RBC Comment Hypochromia  1+       Comprehensive Metabolic Panel    Collection Time: 07/04/24 12:15 PM   Result  Value Ref Range    Sodium 146 (H) 136 - 145 mmol/L    Potassium 3.8 3.5 - 5.1 mmol/L    Chloride 107 98 - 107 mmol/L    CO2 31 (H) 20 - 29 mmol/L    Anion Gap 7 2 - 14 mmol/L    Glucose 156 (H) 65 - 100 mg/dL    BUN 21 8 - 23 mg/dL  Creatinine 0.89 0.60 - 1.00 mg/dL    BUN/Creatinine Ratio 23 (H) 12 - 20      Est, Glom Filt Rate 73 >59 ml/min/1.90m2    Calcium  8.5 (L) 8.8 - 10.2 mg/dL    Total Bilirubin 0.7 0.0 - 1.2 mg/dL    AST 38 (H) 10 - 35 U/L    ALT 21 10 - 35 U/L    Alk Phosphatase 177 (H) 35 - 104 U/L    Total Protein 6.0 (L) 6.4 - 8.3 g/dL    Albumin 2.9 (L) 3.5 - 5.2 g/dL    Globulin 3.1 2.0 - 4.0 g/dL    Albumin/Globulin Ratio 0.9 (L) 1.1 - 2.2     COVID-19 & Influenza Combo    Collection Time: 07/04/24 12:15 PM    Specimen: Nasopharynx   Result Value Ref Range    SARS-CoV-2, PCR Not Detected Not Detected      Rapid Influenza A By PCR Not Detected Not Detected      Rapid Influenza B By PCR Not Detected Not Detected     Urinalysis with Reflex to Culture    Collection Time: 07/04/24 12:15 PM    Specimen: Urine   Result Value Ref Range    Color, UA Yellow/Straw      Appearance Clear Clear      Specific Gravity, UA 1.019 1.003 - 1.030      pH, Urine 6.0 5.0 - 8.0      Protein, UA Negative Negative mg/dL    Glucose, Ur Negative Negative mg/dL    Ketones, Urine Negative Negative mg/dL    Bilirubin, Urine Negative Negative      Blood, Urine Negative Negative      Urobilinogen, Urine 2.0 (H) 0.1 - 1.0 EU/dL    Nitrite, Urine Negative Negative      Leukocyte Esterase, Urine Negative Negative      WBC, UA 5-10 0 - 4 /hpf    RBC, UA 0-5 0 - 5 /hpf    Epithelial Cells, UA Few Few /lpf    BACTERIA, URINE Negative Negative /hpf    Urine Culture if Indicated Culture not indicated by UA result Culture not indicated by UA result      Mucus, UA Trace (A) Negative /lpf   Troponin    Collection Time: 07/04/24 12:15 PM   Result Value Ref Range    Troponin T 18.8 (H) 0 - 14 ng/L   Brain Natriuretic Peptide    Collection  Time: 07/04/24 12:15 PM   Result Value Ref Range    NT Pro-BNP 607 (H) 0 - 125 pg/mL       EKG:.EKG interpreted by me. Shows normal sinus rhythm 61, no ST elevations, normal axis normal intervals    Radiologic Studies:  Radiographic images are visualized and preliminarily interpreted by the ED Provider with the following findings: Xray Interpreted by me.  Shows chest x-ray reviewed by myself, right lower lobe infiltrate noted, on change from previous x-ray approximately 3 weeks prior..     Interpretation per the Radiologist below, if available at the time of this note:  XR CHEST PORTABLE   Final Result   Right lower lobe pneumonia   Unchanged cardiomegaly         Electronically signed by Devere Cal           Records Reviewed: I reviewed and interpreted the Non-ED hospitalist discharge summary note on 10/27 date which showed hospital course for admission due to respiratory failure, right lower  lobe pneumonia    MEDICAL DECISION MAKING and ED COURSE   2:34 PM Differential and Considerations of tests not ordered: 63 year old female history of chronic respiratory failure on O2, COPD, diabetes, hypertension, presents to the emergency department for evaluation of cough shortness of breath dyspnea worsening over the last several days.    Physical shows chronically ill-appearing female does appear moderately anxious however no severe respiratory distress, decreased breath sounds bilateral no lower extremity swelling.    Differential diagnosis includes COPD exacerbation, CHF exacerbation, ACS, pleural effusion, pulmonary edema, PE    Patient not requiring more oxygen than baseline, given decreased breath sounds bilateral with administer DuoNeb , Solu-Medrol  125 mg, will obtain chest x-ray, basic lab work, of note patient had a CTA completed on admission approximately 3 weeks ago which was negative for acute PE, at this time I do not feel that a repeat CTA is necessary.  Additionally patient complaining of severe anxiety has  not taken her anxiety medications this evening will give 0.5 mg of Xanax  p.o.         Vitals:    Vitals:    07/02/24 1928 07/02/24 2134 07/02/24 2328   BP: 99/75  (!) 146/71   Pulse: 71  69   Resp: 22  20   Temp:  97.6 F (36.4 C)    TempSrc:  Oral    SpO2: 95%  95%       ED COURSE  ED Course as of 07/04/24 1434   Wed Jul 02, 2024   2210 Patient reassessed states that she feels slightly improved, patient does not have any active wheezing at this time.  Appears more comfortable. [PZ]   2337 Patient's lab works resulting, interpreted by myself, metabolic panel shows bicarb of 30, which appears to be at patient's baseline, CBC shows no leukocytosis, stable H&H,     [PZ]   2338 Patient reassessed states she is still dyspneic however she feels like she is at her baseline now, would like to go home which she states she is more comfortable there, I offered patient admission, states that she would prefer to go home at this time.  No signs symptoms of acute respiratory failure, patient not requiring more oxygen than baseline, at this time feel that patient is stable for discharge will prescribe short course of prednisone , instructed to follow-up with her pulmonologist within the next 24 to 48 hours, return to ED instructed if any worsening shortness of breath, chest pains. [PZ]      ED Course User Index  [PZ] Myla Deward POUR, MD       Clinical Management Tools:  Not Applicable    Smoking Cessation: Not Applicable    Patient was given the following medications:  Medications   ipratropium 0.5 mg-albuterol  2.5 mg (DUONEB ) nebulizer solution 1 Dose (1 Dose Inhalation Given 07/02/24 2116)   ALPRAZolam  (XANAX ) tablet 0.5 mg (0.5 mg Oral Given 07/02/24 2117)       CONSULTS: See ED Course/MDM for further details.  None     PROCEDURES   See ED course/ MDM for any additional procedures.  Procedures      SEPSIS REASSESSMENT & CRITICAL CARE TIME   SEPSIS REASSESSMENT: No suspicion of bacterial infection and not having 2 SIRS during  this visit.    Critical Care: Patient does not meet Critical Care Time, 0 minutes  CLINICAL IMPRESSIONS     1. COPD exacerbation (HCC)    2. Dyspnea, unspecified type  SDOH/DISPOSITION/PLAN   Social Determinants affecting Treatment Plan: None    DISPOSITION Decision To Discharge 07/02/2024 11:38:56 PM   DISPOSITION CONDITION Stable         Discharge Note: The patient is stable for discharge home. The signs, symptoms, diagnosis, and discharge instructions have been discussed, understanding conveyed, and agreed upon. The patient is to follow up as recommended or return to ER should their symptoms worsen.      PATIENT REFERRED TO:  Your primary care physician    In 2 days  As needed        DISCHARGE MEDICATIONS:     Medication List        START taking these medications      predniSONE  50 MG tablet  Commonly known as: DELTASONE   Take 1 tablet by mouth daily for 5 days            CONTINUE taking these medications      Blood Glucose Monitor System w/Device Kit  Pharmacist to identify preferred meter and strips.     Lancets 30G Misc  Test 1-2 times a day & as needed for symptoms of irregular blood glucose. Dispense sufficient amount for indicated testing frequency plus additional to accommodate PRN testing needs. Pharmacist to identify preferred brand.     sertraline  100 MG tablet  Commonly known as: ZOLOFT   Take 1 tablet by mouth daily            ASK your doctor about these medications      acetaminophen  325 MG tablet  Commonly known as: TYLENOL   Take 1 tablet by mouth every 6 hours as needed for Pain     albuterol  sulfate HFA 108 (90 Base) MCG/ACT inhaler  Commonly known as: Ventolin  HFA  Inhale 2 puffs into the lungs 4 times daily as needed for Wheezing     apixaban  5 MG Tabs tablet  Commonly known as: ELIQUIS   Take 1 tablet by mouth 2 times daily     atorvastatin  40 MG tablet  Commonly known as: LIPITOR   Take 1 tablet by mouth nightly     blood glucose test strips  Test 1-2 times a day & as needed for symptoms of  irregular blood glucose. Dispense sufficient amount for indicated testing frequency plus additional to accommodate PRN testing needs. Pharmacist to identify preferred brand.     bumetanide  1 MG tablet  Commonly known as: BUMEX   Take 1 tablet by mouth daily     chlorthalidone  25 MG tablet  Commonly known as: HYGROTON      guaiFENesin  600 MG extended release tablet  Commonly known as: MUCINEX   Take 1 tablet by mouth 2 times daily     lactobacillus capsule  Take 1 capsule by mouth daily (with breakfast)     Melatonin 10 MG Tabs     metFORMIN  500 MG extended release tablet  Commonly known as: GLUCOPHAGE -XR     metoprolol  tartrate 50 MG tablet  Commonly known as: LOPRESSOR   Take 1 tablet by mouth 2 times daily     OLANZapine  10 MG tablet  Commonly known as: ZYPREXA   Take 1 tablet by mouth nightly     ondansetron  4 MG disintegrating tablet  Commonly known as: ZOFRAN -ODT  Take 1 tablet by mouth every 8 hours as needed for Nausea or Vomiting     pantoprazole  40 MG tablet  Commonly known as: PROTONIX   Take 1 tablet by mouth 2 times daily (before meals)     Trelegy Ellipta  100-62.5-25 MCG/ACT  Aepb inhaler  Generic drug: fluticasone -umeclidin-vilant  Inhale 1 puff into the lungs daily               Where to Get Your Medications        These medications were sent to [facility] [address], [place] - [address] - [place] [phone] [address] [place] [phone]  [address] [place], [place] [address]      Phone: [phone]   predniSONE  50 MG tablet  sertraline  100 MG tablet           DISCONTINUED MEDICATIONS:  Discharge Medication List as of 07/03/2024  2:10 AM          I am the Primary Clinician of Record. Deward Nicely, MD (electronically signed)    (Please note that parts of this dictation were completed with voice recognition software. Quite often unanticipated grammatical, syntax, homophones, and other interpretive errors are inadvertently transcribed by the computer software. Please disregards these errors. Please  excuse any errors that have escaped final proofreading.)     Rollin Kotowski K, MD  07/04/24 1435    "

## 2024-07-03 ENCOUNTER — Inpatient Hospital Stay
Admit: 2024-07-03 | Discharge: 2024-07-03 | Disposition: A | Discharge status: Home / Self Care | Payer: Medicare (Managed Care) | Arrived: AM | Attending: Emergency Medicine

## 2024-07-03 ENCOUNTER — Inpatient Hospital Stay: Admit: 2024-07-03 | Discharge: 2024-07-03 | Payer: Medicare (Managed Care)

## 2024-07-03 LAB — COMPREHENSIVE METABOLIC PANEL
AST: 44 U/L — ABNORMAL HIGH (ref 10–35)
Albumin/Globulin Ratio: 0.8 — ABNORMAL LOW (ref 1.1–2.2)
Albumin: 2.9 g/dL — ABNORMAL LOW (ref 3.5–5.2)
Alk Phosphatase: 199 U/L — ABNORMAL HIGH (ref 35–104)
Anion Gap: 8 mmol/L (ref 2–14)
BUN/Creatinine Ratio: 28 — ABNORMAL HIGH (ref 12–20)
CO2: 30 mmol/L — ABNORMAL HIGH (ref 20–29)
Calcium: 8.9 mg/dL (ref 8.8–10.2)
Chloride: 106 mmol/L (ref 98–107)
Creatinine: 0.63 mg/dL (ref 0.60–1.00)
Est, Glom Filt Rate: >90 ml/min/1.73m2 (ref >59)
Globulin: 3.7 g/dL (ref 2.0–4.0)
Glucose: 126 mg/dL — ABNORMAL HIGH (ref 65–100)
Potassium: 3.7 mmol/L (ref 3.5–5.1)
Sodium: 143 mmol/L (ref 136–145)
Total Bilirubin: 0.8 mg/dL (ref 0.0–1.2)
Total Protein: 6.6 g/dL (ref 6.4–8.3)

## 2024-07-03 LAB — EKG 12-LEAD
Atrial Rate: 61 {beats}/min
P Axis: 26 degrees
P-R Interval: 162 ms
Q-T Interval: 460 ms
QRS Duration: 78 ms
QTc Calculation (Bazett): 463 ms
R Axis: 27 degrees
T Axis: 5 degrees
Ventricular Rate: 61 {beats}/min

## 2024-07-03 LAB — CBC WITH AUTO DIFFERENTIAL
Basophils %: 0.6 % (ref 0.0–1.0)
Eosinophils %: 2.7 % (ref 0.0–7.0)
Eosinophils Absolute: 0.18 K/UL (ref 0.00–0.40)
Hematocrit: 34.4 % — ABNORMAL LOW (ref 35.0–47.0)
Hemoglobin: 10 g/dL — ABNORMAL LOW (ref 11.5–16.0)
Immature Granulocytes %: 0.3 % (ref 0–0.5)
Immature Granulocytes Absolute: 0.02 K/UL (ref 0.00–0.04)
Lymphocytes %: 26.8 % (ref 12.0–49.0)
Lymphocytes Absolute: 1.82 K/UL (ref 0.80–3.50)
MCH: 25.8 pg — ABNORMAL LOW (ref 26.0–34.0)
MCHC: 29.1 g/dL — ABNORMAL LOW (ref 30.0–36.5)
MCV: 88.7 FL (ref 80.0–99.0)
MPV: 12 FL (ref 8.9–12.9)
Neutrophils %: 61.8 % (ref 32.0–75.0)
Platelets: 114 K/uL — ABNORMAL LOW (ref 150–400)
RBC: 3.88 M/uL (ref 3.80–5.20)
RDW: 18.4 % — ABNORMAL HIGH (ref 11.5–14.5)
WBC: 6.8 K/uL (ref 3.6–11.0)
nRBC: 0 K/uL (ref 0.00–0.01)

## 2024-07-03 MED ORDER — METHYLPREDNISOLONE SODIUM SUCC 125 MG IJ SOLR
125 | Freq: Every day | INTRAMUSCULAR | Status: DC
Start: 2024-07-03 — End: 2024-07-03
  Administered 2024-07-03: 02:00:00 125 mg via INTRAVENOUS

## 2024-07-03 MED ORDER — PREDNISONE 50 MG PO TABS
50 | ORAL_TABLET | Freq: Every day | ORAL | 0 refills | 7.00000 days | Status: DC
Start: 2024-07-03 — End: 2024-07-06

## 2024-07-03 MED ORDER — SERTRALINE HCL 100 MG PO TABS
100 | ORAL_TABLET | Freq: Every day | ORAL | 3 refills | 90.00000 days | Status: DC
Start: 2024-07-03 — End: 2024-07-24

## 2024-07-03 MED ORDER — IPRATROPIUM-ALBUTEROL 0.5-2.5 (3) MG/3ML IN SOLN
0.5-2.5 | RESPIRATORY_TRACT | Status: AC
Start: 2024-07-03 — End: 2024-07-02
  Administered 2024-07-03: 02:00:00 1 via RESPIRATORY_TRACT

## 2024-07-03 MED ORDER — ALPRAZOLAM 0.5 MG PO TABS
0.5 | ORAL | Status: AC
Start: 2024-07-03 — End: 2024-07-02
  Administered 2024-07-03: 02:00:00 0.5 mg via ORAL

## 2024-07-03 MED FILL — ALPRAZOLAM 0.5 MG PO TABS: 0.5 mg | ORAL | Qty: 1 | Fill #0

## 2024-07-03 MED FILL — METHYLPREDNISOLONE SODIUM SUCC 125 MG IJ SOLR: 125 mg | INTRAMUSCULAR | Qty: 125 | Fill #0

## 2024-07-03 MED FILL — IPRATROPIUM-ALBUTEROL 0.5-2.5 (3) MG/3ML IN SOLN: 0.5-2.5 (3) MG/3ML | RESPIRATORY_TRACT | Qty: 3 | Fill #0

## 2024-07-04 ENCOUNTER — Inpatient Hospital Stay
Admission: EM | Admit: 2024-07-04 | Discharge: 2024-07-06 | Disposition: A | Discharge status: Home Health | Payer: Medicare (Managed Care) | Admitting: Student in an Organized Health Care Education/Training Program

## 2024-07-04 ENCOUNTER — Emergency Department: Admit: 2024-07-04 | Payer: Medicare (Managed Care) | Primary: Diagnostic Radiology

## 2024-07-04 DIAGNOSIS — J189 Pneumonia, unspecified organism: Principal | ICD-10-CM

## 2024-07-04 LAB — COMPREHENSIVE METABOLIC PANEL
ALT: 21 U/L (ref 10–35)
AST: 38 U/L — ABNORMAL HIGH (ref 10–35)
Albumin/Globulin Ratio: 0.9 — ABNORMAL LOW (ref 1.1–2.2)
Albumin: 2.9 g/dL — ABNORMAL LOW (ref 3.5–5.2)
Alk Phosphatase: 177 U/L — ABNORMAL HIGH (ref 35–104)
Anion Gap: 7 mmol/L (ref 2–14)
BUN/Creatinine Ratio: 23 — ABNORMAL HIGH (ref 12–20)
BUN: 21 mg/dL (ref 8–23)
CO2: 31 mmol/L — ABNORMAL HIGH (ref 20–29)
Calcium: 8.5 mg/dL — ABNORMAL LOW (ref 8.8–10.2)
Chloride: 107 mmol/L (ref 98–107)
Creatinine: 0.89 mg/dL (ref 0.60–1.00)
Est, Glom Filt Rate: 73 ml/min/1.73m2 (ref >59)
Globulin: 3.1 g/dL (ref 2.0–4.0)
Glucose: 156 mg/dL — ABNORMAL HIGH (ref 65–100)
Potassium: 3.8 mmol/L (ref 3.5–5.1)
Sodium: 146 mmol/L — ABNORMAL HIGH (ref 136–145)
Total Bilirubin: 0.7 mg/dL (ref 0.0–1.2)
Total Protein: 6 g/dL — ABNORMAL LOW (ref 6.4–8.3)

## 2024-07-04 LAB — CBC WITH AUTO DIFFERENTIAL
Basophils %: 0.6 % (ref 0.0–1.0)
Basophils Absolute: 0.05 K/UL (ref 0.00–0.10)
Eosinophils %: 1.7 % (ref 0.0–7.0)
Eosinophils Absolute: 0.13 K/UL (ref 0.00–0.40)
Hematocrit: 33.1 % — ABNORMAL LOW (ref 35.0–47.0)
Hemoglobin: 9.5 g/dL — ABNORMAL LOW (ref 11.5–16.0)
Immature Granulocytes %: 0.4 % (ref 0–0.5)
Immature Granulocytes Absolute: 0.03 K/UL (ref 0.00–0.04)
Lymphocytes %: 29.3 % (ref 12.0–49.0)
Lymphocytes Absolute: 2.29 K/UL (ref 0.80–3.50)
MCH: 25.7 pg — ABNORMAL LOW (ref 26.0–34.0)
MCHC: 28.7 g/dL — ABNORMAL LOW (ref 30.0–36.5)
MCV: 89.7 FL (ref 80.0–99.0)
MPV: 12.1 FL (ref 8.9–12.9)
Monocytes %: 7.5 % (ref 5.0–13.0)
Monocytes Absolute: 0.59 K/UL (ref 0.00–1.00)
Neutrophils %: 60.5 % (ref 32.0–75.0)
Neutrophils Absolute: 4.71 K/UL (ref 1.80–8.00)
Nucleated RBCs: 0 /100{WBCs}
Platelets: 149 K/uL — ABNORMAL LOW (ref 150–400)
RBC: 3.69 M/uL — ABNORMAL LOW (ref 3.80–5.20)
RDW: 18.5 % — ABNORMAL HIGH (ref 11.5–14.5)
WBC: 7.8 K/uL (ref 3.6–11.0)
nRBC: 0 K/uL (ref 0.00–0.01)

## 2024-07-04 LAB — URINALYSIS WITH REFLEX TO CULTURE
BACTERIA, URINE: NEGATIVE /HPF
Bilirubin, Urine: NEGATIVE
Blood, Urine: NEGATIVE
Glucose, Ur: NEGATIVE mg/dL
Ketones, Urine: NEGATIVE mg/dL
Leukocyte Esterase, Urine: NEGATIVE
Nitrite, Urine: NEGATIVE
Protein, UA: NEGATIVE mg/dL
Specific Gravity, UA: 1.019 (ref 1.003–1.030)
Urobilinogen, Urine: 2 EU/dL — ABNORMAL HIGH (ref 0.1–1.0)
pH, Urine: 6 (ref 5.0–8.0)

## 2024-07-04 LAB — BLOOD GAS, ARTERIAL
Base Excess, Arterial: 4.2 mmol/L — ABNORMAL HIGH (ref 0–3)
Carboxyhgb, Arterial: 0.8 % — ABNORMAL LOW (ref 1–2)
FIO2 Arterial: 36 %
HCO3, Arterial: 29 mmol/L — ABNORMAL HIGH (ref 22–26)
Methemoglobin, Arterial: 0.3 % (ref 0–1.4)
O2 Flow Rate: 4 L/min
O2 Sat, Arterial: 97 % (ref 95–99)
Oxyhemoglobin: 95.8 % (ref 95–99)
Temperature: 98
pCO2, Arterial: 44 mmHg (ref 35–45)
pH, Arterial: 7.44 (ref 7.35–7.45)
pO2, Arterial: 91 mmHg (ref 80–100)

## 2024-07-04 LAB — TROPONIN
Troponin T: 18.8 ng/L — ABNORMAL HIGH (ref 0–14)
Troponin T: 18.7 ng/L — ABNORMAL HIGH (ref 0–14)

## 2024-07-04 LAB — POCT GLUCOSE: POC Glucose: 153 mg/dL — ABNORMAL HIGH (ref 65–100)

## 2024-07-04 LAB — D-DIMER, QUANTITATIVE: D-Dimer, Quant: 0.42 ug{FEU}/mL (ref <0.50)

## 2024-07-04 LAB — COVID-19 & INFLUENZA COMBO
Rapid Influenza A By PCR: NOT DETECTED
Rapid Influenza B By PCR: NOT DETECTED
SARS-CoV-2, PCR: NOT DETECTED

## 2024-07-04 LAB — BRAIN NATRIURETIC PEPTIDE: NT Pro-BNP: 607 pg/mL — ABNORMAL HIGH (ref 0–125)

## 2024-07-04 MED ORDER — GUAIFENESIN ER 600 MG PO TB12
600 | Freq: Two times a day (BID) | ORAL | Status: DC
Start: 2024-07-04 — End: 2024-07-06
  Administered 2024-07-05 – 2024-07-06 (×4): 600 mg via ORAL

## 2024-07-04 MED ORDER — SODIUM CHLORIDE 0.9 % IV SOLN
0.9 | Freq: Once | INTRAVENOUS | Status: DC
Start: 2024-07-04 — End: 2024-07-04
  Administered 2024-07-04: 20:00:00 500 mg via INTRAVENOUS

## 2024-07-04 MED ORDER — OLANZAPINE 10 MG PO TABS
10 | Freq: Every evening | ORAL | Status: DC
Start: 2024-07-04 — End: 2024-07-06
  Administered 2024-07-05 – 2024-07-06 (×2): 10 mg via ORAL

## 2024-07-04 MED ORDER — CHLORTHALIDONE 25 MG PO TABS
25 | Freq: Every day | ORAL | Status: DC
Start: 2024-07-04 — End: 2024-07-05

## 2024-07-04 MED ORDER — POTASSIUM CHLORIDE 10 MEQ/100ML IV SOLN
10 | INTRAVENOUS | Status: DC | PRN
Start: 2024-07-04 — End: 2024-07-06

## 2024-07-04 MED ORDER — METOPROLOL TARTRATE 50 MG PO TABS
50 | Freq: Two times a day (BID) | ORAL | Status: DC
Start: 2024-07-04 — End: 2024-07-06
  Administered 2024-07-05 – 2024-07-06 (×4): 50 mg via ORAL

## 2024-07-04 MED ORDER — POTASSIUM BICARB-CITRIC ACID 20 MEQ PO TBEF
20 | ORAL | Status: DC | PRN
Start: 2024-07-04 — End: 2024-07-06
  Administered 2024-07-05 – 2024-07-06 (×2): 40 meq via ORAL

## 2024-07-04 MED ORDER — ACETAMINOPHEN 325 MG PO TABS
325 | Freq: Three times a day (TID) | ORAL | Status: DC | PRN
Start: 2024-07-04 — End: 2024-07-06
  Administered 2024-07-05: 02:00:00 325 mg via ORAL

## 2024-07-04 MED ORDER — SERTRALINE HCL 50 MG PO TABS
50 | Freq: Every day | ORAL | Status: DC
Start: 2024-07-04 — End: 2024-07-06
  Administered 2024-07-05 – 2024-07-06 (×2): 100 mg via ORAL

## 2024-07-04 MED ORDER — DEXTROSE 10 % IV BOLUS
INTRAVENOUS | Status: DC | PRN
Start: 2024-07-04 — End: 2024-07-06

## 2024-07-04 MED ORDER — ACETAMINOPHEN 650 MG RE SUPP
650 | Freq: Four times a day (QID) | RECTAL | Status: DC | PRN
Start: 2024-07-04 — End: 2024-07-06

## 2024-07-04 MED ORDER — IPRATROPIUM-ALBUTEROL 0.5-2.5 (3) MG/3ML IN SOLN
0.5-2.5 | RESPIRATORY_TRACT | Status: AC
Start: 2024-07-04 — End: 2024-07-04
  Administered 2024-07-04: 19:00:00 1 via RESPIRATORY_TRACT

## 2024-07-04 MED ORDER — STERILE WATER FOR INJECTION (MIXTURES ONLY)
125 | INTRAMUSCULAR | Status: AC
Start: 2024-07-04 — End: 2024-07-04
  Administered 2024-07-04: 18:00:00 60 mg via INTRAVENOUS

## 2024-07-04 MED ORDER — GLUCAGON HCL (DIAGNOSTIC) 1 MG IJ SOLR
1 | INTRAMUSCULAR | Status: DC | PRN
Start: 2024-07-04 — End: 2024-07-06

## 2024-07-04 MED ORDER — DEXTROSE 10 % IV SOLN
10 | INTRAVENOUS | Status: DC | PRN
Start: 2024-07-04 — End: 2024-07-06

## 2024-07-04 MED ORDER — INSULIN LISPRO 100 UNIT/ML IJ SOLN
100 | Freq: Four times a day (QID) | INTRAMUSCULAR | Status: DC
Start: 2024-07-04 — End: 2024-07-06
  Administered 2024-07-05: 02:00:00 8 [IU] via SUBCUTANEOUS
  Administered 2024-07-05: 21:00:00 2 [IU] via SUBCUTANEOUS
  Administered 2024-07-05: 17:00:00 4 [IU] via SUBCUTANEOUS
  Administered 2024-07-06: 17:00:00 2 [IU] via SUBCUTANEOUS
  Administered 2024-07-06: 02:00:00 4 [IU] via SUBCUTANEOUS

## 2024-07-04 MED ORDER — BUMETANIDE 0.25 MG/ML IJ SOLN
0.25 | INTRAMUSCULAR | Status: AC
Start: 2024-07-04 — End: 2024-07-04
  Administered 2024-07-04: 18:00:00 1 mg via INTRAVENOUS

## 2024-07-04 MED ORDER — IPRATROPIUM-ALBUTEROL 0.5-2.5 (3) MG/3ML IN SOLN
0.5-2.5 | RESPIRATORY_TRACT | Status: DC
Start: 2024-07-04 — End: 2024-07-04

## 2024-07-04 MED ORDER — LIDOCAINE 4 % EX PTCH
4 | CUTANEOUS | Status: AC
Start: 2024-07-04 — End: 2024-07-05
  Administered 2024-07-04: 20:00:00 1 via TRANSDERMAL

## 2024-07-04 MED ORDER — BUMETANIDE 0.25 MG/ML IJ SOLN
0.25 | Freq: Two times a day (BID) | INTRAMUSCULAR | Status: DC
Start: 2024-07-04 — End: 2024-07-06
  Administered 2024-07-04 – 2024-07-06 (×4): 1 mg via INTRAVENOUS

## 2024-07-04 MED ORDER — BUDESONIDE-FORMOTEROL FUMARATE 80-4.5 MCG/ACT IN AERO
80-4.5 | Freq: Two times a day (BID) | RESPIRATORY_TRACT | Status: DC
Start: 2024-07-04 — End: 2024-07-06
  Administered 2024-07-05 – 2024-07-06 (×4): 2 via RESPIRATORY_TRACT

## 2024-07-04 MED ORDER — ATORVASTATIN CALCIUM 40 MG PO TABS
40 | Freq: Every evening | ORAL | Status: DC
Start: 2024-07-04 — End: 2024-07-06
  Administered 2024-07-05 – 2024-07-06 (×2): 40 mg via ORAL

## 2024-07-04 MED ORDER — POTASSIUM CHLORIDE CRYS ER 20 MEQ PO TBCR
20 | ORAL | Status: DC | PRN
Start: 2024-07-04 — End: 2024-07-06

## 2024-07-04 MED ORDER — POLYETHYLENE GLYCOL 3350 17 G PO PACK
17 | Freq: Every day | ORAL | Status: DC | PRN
Start: 2024-07-04 — End: 2024-07-06

## 2024-07-04 MED ORDER — MAGNESIUM SULFATE 2000 MG/50 ML IVPB PREMIX
2 | INTRAVENOUS | Status: DC | PRN
Start: 2024-07-04 — End: 2024-07-06

## 2024-07-04 MED ORDER — NORMAL SALINE FLUSH 0.9 % IV SOLN
0.9 | INTRAVENOUS | Status: DC | PRN
Start: 2024-07-04 — End: 2024-07-06

## 2024-07-04 MED ORDER — CULTURELLE PO CAPS
Freq: Every day | ORAL | Status: DC
Start: 2024-07-04 — End: 2024-07-06
  Administered 2024-07-05 – 2024-07-06 (×2): 1 via ORAL

## 2024-07-04 MED ORDER — MELATONIN 5 MG PO TABS
5 | Freq: Every evening | ORAL | Status: DC
Start: 2024-07-04 — End: 2024-07-06
  Administered 2024-07-05 – 2024-07-06 (×2): 5 via ORAL

## 2024-07-04 MED ORDER — SODIUM CHLORIDE 0.9 % IV SOLN
0.9 | INTRAVENOUS | Status: DC | PRN
Start: 2024-07-04 — End: 2024-07-06

## 2024-07-04 MED ORDER — ACETAMINOPHEN 325 MG PO TABS
325 | Freq: Four times a day (QID) | ORAL | Status: DC | PRN
Start: 2024-07-04 — End: 2024-07-06
  Administered 2024-07-06: 04:00:00 650 mg via ORAL

## 2024-07-04 MED ORDER — STERILE WATER FOR INJECTION (MIXTURES ONLY)
1 | Freq: Once | INTRAMUSCULAR | Status: AC
Start: 2024-07-04 — End: 2024-07-04
  Administered 2024-07-04: 20:00:00 1000 mg via INTRAVENOUS

## 2024-07-04 MED ORDER — TIOTROPIUM BROMIDE 2.5 MCG/ACT IN AERS
2.5 | Freq: Every day | RESPIRATORY_TRACT | Status: DC
Start: 2024-07-04 — End: 2024-07-04

## 2024-07-04 MED ORDER — IPRATROPIUM-ALBUTEROL 0.5-2.5 (3) MG/3ML IN SOLN
0.5-2.5 | RESPIRATORY_TRACT | Status: DC
Start: 2024-07-04 — End: 2024-07-06
  Administered 2024-07-04 – 2024-07-06 (×11): 1 via RESPIRATORY_TRACT

## 2024-07-04 MED ORDER — ENOXAPARIN SODIUM 120 MG/0.8ML IJ SOSY
120 | Freq: Two times a day (BID) | INTRAMUSCULAR | Status: DC
Start: 2024-07-04 — End: 2024-07-06
  Administered 2024-07-05 – 2024-07-06 (×4): 120 mg/kg via SUBCUTANEOUS

## 2024-07-04 MED ORDER — DOXYCYCLINE HYCLATE 100 MG PO CAPS
100 | Freq: Two times a day (BID) | ORAL | Status: DC
Start: 2024-07-04 — End: 2024-07-06
  Administered 2024-07-05 – 2024-07-06 (×4): 100 mg via ORAL

## 2024-07-04 MED ORDER — SODIUM CHLORIDE 0.9 % IV SOLN (ADDEASE)
0.9 | Freq: Three times a day (TID) | INTRAVENOUS | Status: DC
Start: 2024-07-04 — End: 2024-07-05
  Administered 2024-07-05 (×3): 3375 mg via INTRAVENOUS

## 2024-07-04 MED ORDER — GLUCOSE 4 G PO CHEW
4 | ORAL | Status: DC | PRN
Start: 2024-07-04 — End: 2024-07-06

## 2024-07-04 MED ORDER — METHYLPREDNISOLONE SODIUM SUCC 40 MG IJ SOLR
40 | Freq: Every day | INTRAMUSCULAR | Status: DC
Start: 2024-07-04 — End: 2024-07-06
  Administered 2024-07-05 – 2024-07-06 (×2): 40 mg via INTRAVENOUS

## 2024-07-04 MED ORDER — PANTOPRAZOLE SODIUM 40 MG PO TBEC
40 | Freq: Every day | ORAL | Status: DC
Start: 2024-07-04 — End: 2024-07-06
  Administered 2024-07-05 – 2024-07-06 (×2): 40 mg via ORAL

## 2024-07-04 MED ORDER — NORMAL SALINE FLUSH 0.9 % IV SOLN
0.9 | Freq: Two times a day (BID) | INTRAVENOUS | Status: DC
Start: 2024-07-04 — End: 2024-07-06
  Administered 2024-07-05 – 2024-07-06 (×4): 10 mL via INTRAVENOUS

## 2024-07-04 MED FILL — IPRATROPIUM-ALBUTEROL 0.5-2.5 (3) MG/3ML IN SOLN: 0.5-2.5 (3) MG/3ML | RESPIRATORY_TRACT | Qty: 3 | Fill #0

## 2024-07-04 MED FILL — METHYLPREDNISOLONE SODIUM SUCC 125 MG IJ SOLR: 125 mg | INTRAMUSCULAR | Qty: 125 | Fill #0

## 2024-07-04 MED FILL — BUMETANIDE 0.25 MG/ML IJ SOLN: 0.25 mg/mL | INTRAMUSCULAR | Qty: 4 | Fill #0

## 2024-07-04 MED FILL — SODIUM CHLORIDE FLUSH 0.9 % IV SOLN: 0.9 % | INTRAVENOUS | Qty: 40 | Fill #0

## 2024-07-04 MED FILL — DEXTROSE 10 % IV SOLN: 10 % | INTRAVENOUS | Qty: 1000 | Fill #0

## 2024-07-04 MED FILL — AZITHROMYCIN 500 MG IV SOLR: 500 mg | INTRAVENOUS | Qty: 500 | Fill #0

## 2024-07-04 MED FILL — LIDOCAINE PAIN RELIEF 4 % EX PTCH: 4 % | CUTANEOUS | Qty: 1 | Fill #0

## 2024-07-04 MED FILL — BUMETANIDE 0.25 MG/ML IJ SOLN: 0.25 mg/mL | INTRAMUSCULAR | Qty: 10 | Fill #0

## 2024-07-04 MED FILL — CEFTRIAXONE SODIUM 1 G IJ SOLR: 1 g | INTRAMUSCULAR | Qty: 1000 | Fill #0

## 2024-07-04 NOTE — H&P (Signed)
 "    Hospitalist Admission Note    NAME:   Breanna Lester   DOB: [birth year]   MRN: [id number]     Date/Time: 07/04/2024 2:43 PM    Patient PCP: Unknown, Provider    ______________________________________________________________________  Given the patient's current clinical presentation, I have a high level of concern for decompensation if discharged from the emergency department.  Complex decision making was performed, which includes reviewing the patient's available past medical records, laboratory results, and x-ray films.       My assessment of this patient's clinical condition and my plan of care is as follows.    Assessment / Plan:    Acute respiratory failure with hypoxia  Pneumonia  - Patient presented with shortness of breath with recent admission for pneumonia  - BNP 607  - COVID and flu negative  - Blood cultures pending  -MRSA swab ordered  -ABG ordered   -Respiratory culture ordered  - CXR: Persistent right lower lobe infiltrate consistent with pneumonia and pulmonary vascular congestion with mild edema  - Given recent hospitalization and failure with levofloxacin  and recurrent pneumonia, will place on Zosyn  for now  - Mild QTc prolongation.  Will do doxycycline  for now    CHF exacerbation  - CXR as above  - Echo 04/2024: EF 70 to 75%, normal diastolic function, moderate 1 to 2 cm localized pericardial effusion  - Started on Bumex  1 mg twice daily IV  - Daily weights, strict I's and O's    Elevated troponin  - Troponin trending down  - EKG with no sign of ischemia  -Recent echo reviewed  - Denies chest pain  - Continue telemetry    HTN  - BPs reviewed and acceptable  - Continue home medication: Chlorthalidone     HLD  - Continue home medication: Atorvastatin  40 mg    COPD  - Patient has shortness of breath and coughing  - DuoNebs every 4, IV steroids  - Continue Symbicort     Hypernatremia  -Na 146  -Will monitor in AM labs and do further workup if worsening along with IV hydration but will hold off for now  given slightly volume overload    Atrial fibrillation on Eliquis   - Currently rate controlled  - Continue home medication: Metoprolol   tartrate 50 twice daily  - Will do Lovenox  treatment dose for now    Anemia of chronic disease  - Hemoglobin 9.5 with baseline around 9-10  - No signs of bleeding  - Continue to monitor in a.m. 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 After discussion I am in agreement that acuity of patient's medical condition necessitates hospital stay.      Code Status: Full code  DVT Prophylaxis:   Baseline:     Subjective:   CHIEF COMPLAINT: Shortness of breath    HISTORY OF PRESENT ILLNESS:     Breanna Lester is a 63 y.o.  female  with PMHx significant for HTN, HLD, HFpEF, COPD on 3 L, atrial fibrillation on Eliquis , DM2, liver cirrhosis, and other chronic conditions who presents due to shortness of breath.    Patient states that her symptoms started a couple of days ago.  She reports a nonproductive cough.  No fevers.  No nausea/vomiting.  No sick contacts.  No recent medication changes.  Denies recreational drug use.  Had a 20-pack-year history before.  Reports quitting smoking 16 years ago.  Denies alcohol use.  States that she is in talks with her pulmonologist for potential BiPAP but has been refusing.  Reports being on 3 L at home.    Per chart review, patient was admitted 10/14-10/27 for shortness of breath.  During that admission, patient was noted to be short of breath and had pneumonia.  She  failed BiPAP and was intubated and transferred to ICU.  Blood culture showed Staph epidermidis at the time but no other organism was found.  Stroke alert was also called was confused.  CT head and CTA were negative.  MRI unable to do due to patient's claustrophobia.  She was transferred to the ICU and had worsening hypercapnia placed on BiPAP.  She continued to do well and pulmonology cleared for discharge.    In the ED, patient was afebrile and vitals were stable except patient became hypoxic at 86% on room air.  CBC showed anemia of 9.5 with baseline around 10-11 along with mild thrombocytopenia of 149.  BMP showed hyponatremia of 146.  ALP was mildly elevated along with AST chronically.  BNP was 607.  Troponin was elevated at 18.8.  D-dimer was negative.  Blood cultures were obtained.  COVID and flu was negative.  UA showed no sign of infection.  CXR showed persistent right lower lobe infiltrate consistent with pneumonia along with pulmonary vascular congestion possible mild edema.  X-ray of right shoulder showed no acute findings.  Patient was given Bumex  1 mg, Rocephin , IV steroids, and azithromycin .    We were asked to admit for work up and evaluation of the above problems.     Past Medical History:   Diagnosis Date    Anxiety     Depression     Gastrointestinal disorder     Heart failure (HCC)     Hyperlipidemia     Hypertension     Liver disease 2009    liver failure    Pneumonia         Past Surgical History:   Procedure Laterality Date    ADENOIDECTOMY  1972    BACK SURGERY      cement discs following a fall    CATARACT REMOVAL      CESAREAN SECTION  1986 and 1993    DILATION AND CURETTAGE OF UTERUS  1999    EGD TRANSORAL BIOPSY SINGLE/MULTIPLE  05/17/2009         HYSTERECTOMY (CERVIX STATUS UNKNOWN)  2008    TONSILLECTOMY  1972    UPPER GASTROINTESTINAL ENDOSCOPY N/A 04/17/2024    ESOPHAGOGASTRODUODENOSCOPY performed by Madlyn Fendt, MD at [facility] ENDOSCOPY       Social History     Tobacco Use    Smoking  status: Former     Current packs/day: 0.00     Types: Cigarettes     Quit date: 02/28/2008     Years since quitting: 16.3    Smokeless tobacco: Never   Substance Use Topics    Alcohol use: Never  Family History   Problem Relation Age of Onset    Cancer Mother     Heart Disease Father        Allergies   Allergen Reactions    Latex Rash        Prior to Admission medications   Medication Sig Start Date End Date Taking? Authorizing Provider   sertraline  (ZOLOFT ) 100 MG tablet Take 1 tablet by mouth daily 07/02/24   Zelensky, Paul K, MD   predniSONE  (DELTASONE ) 50 MG tablet Take 1 tablet by mouth daily for 5 days 07/02/24 07/07/24  Zelensky, Paul K, MD   atorvastatin  (LIPITOR ) 40 MG tablet Take 1 tablet by mouth nightly 06/12/24   Gillard, Madison A, PA-C   OLANZapine  (ZYPREXA ) 10 MG tablet Take 1 tablet by mouth nightly 06/12/24   Gillard, Madison A, PA-C   metoprolol  tartrate (LOPRESSOR ) 50 MG tablet Take 1 tablet by mouth 2 times daily 06/12/24   Gillard, Madison A, PA-C   bumetanide  (BUMEX ) 1 MG tablet Take 1 tablet by mouth daily 06/12/24   Gillard, Madison A, PA-C   fluticasone -umeclidin-vilant (TRELEGY ELLIPTA ) 100-62.5-25 MCG/ACT AEPB inhaler Inhale 1 puff into the lungs daily 06/12/24 07/12/24  Gillard, Lum A, PA-C   chlorthalidone  (HYGROTON ) 25 MG tablet Take 1 tablet by mouth daily    [provider]   metFORMIN  (GLUCOPHAGE -XR) 500 MG extended release tablet Take 1 tablet by mouth daily (with breakfast)    [provider]   apixaban  (ELIQUIS ) 5 MG TABS tablet Take 1 tablet by mouth 2 times daily 05/09/24   Karenann Barter, MD   guaiFENesin  (MUCINEX ) 600 MG extended release tablet Take 1 tablet by mouth 2 times daily 05/09/24   Karenann Barter, MD   pantoprazole  (PROTONIX ) 40 MG tablet Take 1 tablet by mouth 2 times daily (before meals)  Patient taking differently: Take 1 tablet by mouth daily 05/09/24   Karenann Barter, MD   Melatonin 10 MG TABS Take 1 tablet by mouth at bedtime     [provider]   lactobacillus (CULTURELLE) capsule Take 1 capsule by mouth daily (with breakfast) 04/26/24   Bowlin, Amber L, APRN - CNP   ondansetron  (ZOFRAN -ODT) 4 MG disintegrating tablet Take 1 tablet by mouth every 8 hours as needed for Nausea or Vomiting  Patient not taking: Reported on 06/04/2024 04/17/24   Helmich, Warren CROME, MD   albuterol  sulfate HFA (VENTOLIN  HFA) 108 (90 Base) MCG/ACT inhaler Inhale 2 puffs into the lungs 4 times daily as needed for Wheezing 04/11/24 06/10/24  Bertell Bathe, MD   Blood Glucose Monitoring Suppl (BLOOD GLUCOSE MONITOR SYSTEM) w/Device KIT Pharmacist to identify preferred meter and strips. 04/11/24   Smithson, Olivia LABOR, APRN - CNS   blood glucose monitor strips Test 1-2 times a day & as needed for symptoms of irregular blood glucose. Dispense sufficient amount for indicated testing frequency plus additional to accommodate PRN testing needs. Pharmacist to identify preferred brand. 04/11/24   Smithson, Olivia LABOR, APRN - CNS   Lancets 30G MISC Test 1-2 times a day & as needed for symptoms of irregular blood glucose. Dispense sufficient amount for indicated testing frequency plus additional to accommodate PRN testing needs. Pharmacist to identify preferred brand. 04/11/24   Smithson, Olivia LABOR, APRN - CNS   acetaminophen  (TYLENOL ) 325 MG tablet Take 1 tablet by mouth every 6 hours as needed for Pain 02/26/24 03/07/24  Cutchins, Morene DASEN, PA-C         Objective:   VITALS:  Patient Vitals for the past 24 hrs:   BP Temp Pulse Resp SpO2 Height Weight   07/04/24 1330 (!) 120/91 -- 68 21 100 % -- --   07/04/24 1204 -- -- -- -- -- 1.575 m (5' 2) 122.5 kg (270 lb)   07/04/24 1202 (!) 147/62 97.5 F (36.4 C) 64 20 100 % -- --       Temp (24hrs), Avg:97.5 F (36.4 C), Min:97.5 F (36.4 C), Max:97.5 F (36.4 C)             Wt Readings from Last 12 Encounters:   07/04/24 122.5 kg (270 lb)   06/16/24 116.5 kg (256 lb 13.4 oz)   06/06/24 126.6 kg (279 lb 1.6 oz)   05/03/24 129.7 kg  (286 lb)   05/05/24 129.7 kg (286 lb)   04/25/24 125 kg (275 lb 9.6 oz)   04/17/24 122.5 kg (270 lb)   04/08/24 124.3 kg (274 lb)   02/26/24 113.4 kg (250 lb)   02/18/24 117.9 kg (260 lb)   03/19/22 113.4 kg (250 lb)         PHYSICAL EXAM:  General: In no acute distress  HEENT: NC/AT. No obvious deformities.Nares patent. Oral mucosa moist.   Cardiovascular: S1, S2 present. No murmurs noted.   Respiratory: Normal respiratory effort.  Breath sounds diminished on right lower lobe.  No retractions noted  Abdomen: BS+. Soft, nontender. No TTP in all quadrants. No guarding or rigidity   Lower Extremity: Trace edema bilaterally neurological: CN III-XII grossly intact. Muscle strength 5/5 in UE and 5/5 in LE b/l. No facial drooping or slurring of words.   Psychiatric: Appropriate mood and affect          LAB DATA REVIEWED:    Recent Results (from the past 12 hours)   D-Dimer, Quantitative    Collection Time: 07/04/24 12:14 PM   Result Value Ref Range    D-Dimer, Quant 0.42 <0.50 ug/ml(FEU)   CBC with Auto Differential    Collection Time: 07/04/24 12:15 PM   Result Value Ref Range    WBC 7.8 3.6 - 11.0 K/uL    RBC 3.69 (L) 3.80 - 5.20 M/uL    Hemoglobin 9.5 (L) 11.5 - 16.0 g/dL    Hematocrit 66.8 (L) 35.0 - 47.0 %    MCV 89.7 80.0 - 99.0 FL    MCH 25.7 (L) 26.0 - 34.0 PG    MCHC 28.7 (L) 30.0 - 36.5 g/dL    RDW 81.4 (H) 88.4 - 14.5 %    Platelets 149 (L) 150 - 400 K/uL    MPV 12.1 8.9 - 12.9 FL    Nucleated RBCs 0.0 0.0 PER 100 WBC    nRBC 0.00 0.00 - 0.01 K/uL    Neutrophils % 60.5 32.0 - 75.0 %    Lymphocytes % 29.3 12.0 - 49.0 %    Monocytes % 7.5 5.0 - 13.0 %    Eosinophils % 1.7 0.0 - 7.0 %    Basophils % 0.6 0.0 - 1.0 %    Immature Granulocytes % 0.4 0 - 0.5 %    Neutrophils Absolute 4.71 1.80 - 8.00 K/UL    Lymphocytes Absolute 2.29 0.80 - 3.50 K/UL    Monocytes Absolute 0.59 0.00 - 1.00 K/UL    Eosinophils Absolute 0.13 0.00 - 0.40 K/UL    Basophils Absolute 0.05 0.00 - 0.10 K/UL    Immature Granulocytes Absolute  0.03 0.00 - 0.04 K/UL    Differential Type Smear  Scanned      RBC Comment Anisocytosis  1+        RBC Comment Hypochromia  1+       Comprehensive Metabolic Panel    Collection Time: 07/04/24 12:15 PM   Result Value Ref Range    Sodium 146 (H) 136 - 145 mmol/L    Potassium 3.8 3.5 - 5.1 mmol/L    Chloride 107 98 - 107 mmol/L    CO2 31 (H) 20 - 29 mmol/L    Anion Gap 7 2 - 14 mmol/L    Glucose 156 (H) 65 - 100 mg/dL    BUN 21 8 - 23 mg/dL    Creatinine 9.10 9.39 - 1.00 mg/dL    BUN/Creatinine Ratio 23 (H) 12 - 20      Est, Glom Filt Rate 73 >59 ml/min/1.4m2    Calcium  8.5 (L) 8.8 - 10.2 mg/dL    Total Bilirubin 0.7 0.0 - 1.2 mg/dL    AST 38 (H) 10 - 35 U/L    ALT 21 10 - 35 U/L    Alk Phosphatase 177 (H) 35 - 104 U/L    Total Protein 6.0 (L) 6.4 - 8.3 g/dL    Albumin 2.9 (L) 3.5 - 5.2 g/dL    Globulin 3.1 2.0 - 4.0 g/dL    Albumin/Globulin Ratio 0.9 (L) 1.1 - 2.2     COVID-19 & Influenza Combo    Collection Time: 07/04/24 12:15 PM    Specimen: Nasopharynx   Result Value Ref Range    SARS-CoV-2, PCR Not Detected Not Detected      Rapid Influenza A By PCR Not Detected Not Detected      Rapid Influenza B By PCR Not Detected Not Detected     Urinalysis with Reflex to Culture    Collection Time: 07/04/24 12:15 PM    Specimen: Urine   Result Value Ref Range    Color, UA Yellow/Straw      Appearance Clear Clear      Specific Gravity, UA 1.019 1.003 - 1.030      pH, Urine 6.0 5.0 - 8.0      Protein, UA Negative Negative mg/dL    Glucose, Ur Negative Negative mg/dL    Ketones, Urine Negative Negative mg/dL    Bilirubin, Urine Negative Negative      Blood, Urine Negative Negative      Urobilinogen, Urine 2.0 (H) 0.1 - 1.0 EU/dL    Nitrite, Urine Negative Negative      Leukocyte Esterase, Urine Negative Negative      WBC, UA 5-10 0 - 4 /hpf    RBC, UA 0-5 0 - 5 /hpf    Epithelial Cells, UA Few Few /lpf    BACTERIA, URINE Negative Negative /hpf    Urine Culture if Indicated Culture not indicated by UA result Culture not  indicated by UA result      Mucus, UA Trace (A) Negative /lpf   Troponin    Collection Time: 07/04/24 12:15 PM   Result Value Ref Range    Troponin T 18.8 (H) 0 - 14 ng/L   Brain Natriuretic Peptide    Collection Time: 07/04/24 12:15 PM   Result Value Ref Range    NT Pro-BNP 607 (H) 0 - 125 pg/mL         XR SHOULDER RIGHT (MIN 2 VIEWS)  Result Date: 07/04/2024  EXAM: XR SHOULDER RIGHT (MIN 2 VIEWS) INDICATION: fall. FINDINGS: Three views of the right shoulder demonstrate no  fracture, dislocation or other acute abnormality.     No acute abnormality. Electronically signed by REDELL CUNNING    XR CHEST 1 VIEW  Result Date: 07/04/2024  EXAM: XR CHEST 1 VIEW INDICATION: Fever/Suspected Infection/Sepsis COMPARISON: CT scan 06/04/2024 and chest radiograph 07/02/2024 and 06/10/2024 FINDINGS: A portable AP radiograph of the chest was obtained at 1227 hours. The patient is on a cardiac monitor. There is moderate to severe cardiomegaly. Lungs are fairly well aerated. There is persistent airspace opacities laterally in the right lower lobe that have decreased in density but apparently increased in size consistent with persistent pneumonia in the right lower lobe. Follow-up to resolution is suggested. There is pulmonary vascular ingestion and possibly mild interstitial edema.     1. Persistent right lower lobe infiltrate consistent with pneumonia follow-up to resolution is suggested. 2. There is pulmonary vascular congestion and possible mild interstitial edema.. Electronically signed by Evalene JINNY Hoit    XR CHEST PORTABLE  Result Date: 07/02/2024  EXAM:  XR CHEST PORTABLE INDICATION: Shortness of breath COMPARISON: 06/10/2024 TECHNIQUE: portable chest AP view at 1942 hours FINDINGS: The cardiac silhouette is enlarged. There is an asymmetric airspace process in the right lateral aspect of the right lower lobe The lungs and pleural spaces are clear. The visualized bones and upper abdomen are age-appropriate.     Right lower lobe  pneumonia Unchanged cardiomegaly Electronically signed by Devere Cal    XR CHEST PORTABLE  Result Date: 06/10/2024  EXAM:  XR CHEST PORTABLE INDICATION: Respiratory failure COMPARISON: 06/05/2024 TECHNIQUE: portable chest AP view FINDINGS: Cardiomegaly is noted. The ET tube has been removed in the interval. Persistent but improved diffuse bilateral pulmonary infiltrates greatest in the right upper and right lower lobes. The visualized bones and upper abdomen are age-appropriate.     Persistent but improved diffuse bilateral pulmonary infiltrates. Electronically signed by DARICE COLON    CTA HEAD NECK W CONTRAST  Result Date: 06/06/2024  CLINICAL HISTORY: Left-sided weakness EXAMINATION:  CT ANGIOGRAPHY HEAD AND NECK COMPARISON: CT head 06/06/2024 TECHNIQUE:  Following the uneventful administration of iodinated contrast material, axial CT angiography of the head and neck was performed. Delayed axial images through the head were also obtained. Coronal and sagittal reconstructions were obtained. Manual postprocessing of images was performed. 3-D  Sagittal maximal intensity projection images were obtained.  3-D Coronal maximal intensity projections were obtained.  CT dose reduction was achieved through use of a standardized protocol tailored for this examination and automatic exposure control for dose modulation. CT perfusion analysis was performed using CT with contrast administration, including postprocessing of parametric maps with the determination of cerebral blood flow, cerebral blood volume, and mean transit time. CT dose reduction was achieved through use of a standardized protocol tailored for this examination and automatic exposure control for dose modulation. This study was analyzed by the [employer] algorithm FINDINGS: Delayed contrast-enhanced head CT: The ventricles are midline without hydrocephalus.  There is no acute intra or extra-axial hemorrhage. Mild bilateral subcortical and periventricular areas of  patchy low attenuation is nonspecific but likely related to changes of chronic small vessel ischemic disease. The basal cisterns are clear.  The paranasal sinuses are clear. CTA NECK: Great vessels: Patent great vessel origins Right subclavian artery: Mild atherosclerosis Left subclavian artery: Mild to moderate proximal atherosclerosis Right common carotid artery: Mild atherosclerosis Left common carotid artery: Mild atherosclerosis Cervical right internal carotid artery: Patent with patent without evidence for significant stenosis by NASCET criteria. Limited evaluation proximally due to motion artifact.  Cervical left internal carotid artery: Patent with patent without evidence for significant stenosis stenosis by NASCET criteria. Limited evaluation proximally due to motion artifact. Right vertebral artery: Patent Left vertebral artery: Mild proximal atherosclerosis CTA HEAD: Right cavernous internal carotid artery: Mild atherosclerosis Left cavernous internal carotid artery: Mild atherosclerosis Anterior cerebral arteries: Patent Anterior communicating artery: Patent Right middle cerebral artery: Patent Left middle cerebral artery: Patent Posterior communicating arteries: Right is patent. Left is hypoplastic. Posterior cerebral arteries: Patent Basilar artery: Patent Distal vertebral arteries: Left is patent. Hypoplastic distal right V4 segment. Measurements use NASCET criteria. CT perfusion brain: Technically suboptimal perfusion study Enlarged pulmonary arteries compatible with pulmonary hypertension. Biapical airspace disease likely infectious/inflammatory. Moderate cervical spondylosis.     1. No acute large vessel arterial occlusion. Mild to moderate atherosclerotic changes. 2. Pulmonary hypertension. 3. Biapical airspace disease likely infectious/inflammatory. Electronically signed by Aletha CHRISTELLA Ham    CT BRAIN PERFUSION  Result Date: 06/06/2024  CLINICAL HISTORY: Left-sided weakness EXAMINATION:  CT  ANGIOGRAPHY HEAD AND NECK COMPARISON: CT head 06/06/2024 TECHNIQUE:  Following the uneventful administration of iodinated contrast material, axial CT angiography of the head and neck was performed. Delayed axial images through the head were also obtained. Coronal and sagittal reconstructions were obtained. Manual postprocessing of images was performed. 3-D  Sagittal maximal intensity projection images were obtained.  3-D Coronal maximal intensity projections were obtained.  CT dose reduction was achieved through use of a standardized protocol tailored for this examination and automatic exposure control for dose modulation. CT perfusion analysis was performed using CT with contrast administration, including postprocessing of parametric maps with the determination of cerebral blood flow, cerebral blood volume, and mean transit time. CT dose reduction was achieved through use of a standardized protocol tailored for this examination and automatic exposure control for dose modulation. This study was analyzed by the [employer] algorithm FINDINGS: Delayed contrast-enhanced head CT: The ventricles are midline without hydrocephalus.  There is no acute intra or extra-axial hemorrhage. Mild bilateral subcortical and periventricular areas of patchy low attenuation is nonspecific but likely related to changes of chronic small vessel ischemic disease. The basal cisterns are clear.  The paranasal sinuses are clear. CTA NECK: Great vessels: Patent great vessel origins Right subclavian artery: Mild atherosclerosis Left subclavian artery: Mild to moderate proximal atherosclerosis Right common carotid artery: Mild atherosclerosis Left common carotid artery: Mild atherosclerosis Cervical right internal carotid artery: Patent with patent without evidence for significant stenosis by NASCET criteria. Limited evaluation proximally due to motion artifact. Cervical left internal carotid artery: Patent with patent without evidence for significant  stenosis stenosis by NASCET criteria. Limited evaluation proximally due to motion artifact. Right vertebral artery: Patent Left vertebral artery: Mild proximal atherosclerosis CTA HEAD: Right cavernous internal carotid artery: Mild atherosclerosis Left cavernous internal carotid artery: Mild atherosclerosis Anterior cerebral arteries: Patent Anterior communicating artery: Patent Right middle cerebral artery: Patent Left middle cerebral artery: Patent Posterior communicating arteries: Right is patent. Left is hypoplastic. Posterior cerebral arteries: Patent Basilar artery: Patent Distal vertebral arteries: Left is patent. Hypoplastic distal right V4 segment. Measurements use NASCET criteria. CT perfusion brain: Technically suboptimal perfusion study Enlarged pulmonary arteries compatible with pulmonary hypertension. Biapical airspace disease likely infectious/inflammatory. Moderate cervical spondylosis.     1. No acute large vessel arterial occlusion. Mild to moderate atherosclerotic changes. 2. Pulmonary hypertension. 3. Biapical airspace disease likely infectious/inflammatory. Electronically signed by Aletha CHRISTELLA Ham    CT HEAD WO CONTRAST  Result Date: 06/06/2024  EXAM: CT CODE NEURO HEAD WO  CONTRAST INDICATION: left sided weakness and left hemineglect COMPARISON: 06/04/2024. CONTRAST: None. TECHNIQUE: Unenhanced CT of the head was performed using 5 mm images. Brain and bone windows were generated. Coronal and sagittal reformats. CT dose reduction was achieved through use of a standardized protocol tailored for this examination and automatic exposure control for dose modulation.  FINDINGS: The bone windows demonstrate no abnormalities. The visualized portions of the paranasal sinuses and mastoid air cells are clear. The ventricles and sulci are age-appropriate and symmetric. [name] The basilar cisterns are open.  There is no intracranial hemorrhage, extra-axial collection, or mass effect.  No CT evidence of acute  infarct.     No acute intracranial findings. Electronically signed by PAULINE BLANCH    XR CHEST PORTABLE  Result Date: 06/05/2024  EXAM:  XR CHEST PORTABLE INDICATION: intubated, f/u pneumonia COMPARISON: 06/03/2024 radiograph TECHNIQUE: 0730 hours portable chest AP view FINDINGS: The ET tube is in satisfactory position. NG tube courses into the stomach. The distal tip is not seen. Right-sided PICC line overlies SVC. There is stable cardiomegaly. There is bilateral airspace disease most pronounced in the right upper lobe and right lower lobe but also in the left midlung zone and left lower lobe. This is consistent with pneumonia and has not changed significantly. No pleural effusions.     1. No change bilateral pneumonia Electronically signed by Evalene JINNY Hoit     _______________________________________________________________________    TOTAL TIME:  76 Minutes    Critical Care Provided     Minutes non procedure based    Signed: Nilsa JONETTA Blanch, MD    Procedures: see electronic medical records for all procedures/Xrays and details which were not copied into this note but were reviewed prior to creation of Plan.        "

## 2024-07-04 NOTE — Plan of Care (Signed)
"    Problem: Chronic Conditions and Co-morbidities  Goal: Patient's chronic conditions and co-morbidity symptoms are monitored and maintained or improved  07/04/2024 1751 by Sophia Rattan, RN  Outcome: Progressing  Flowsheets (Taken 07/04/2024 1751)  Care Plan - Patient's Chronic Conditions and Co-Morbidity Symptoms are Monitored and Maintained or Improved:   Monitor and assess patient's chronic conditions and comorbid symptoms for stability, deterioration, or improvement   Collaborate with multidisciplinary team to address chronic and comorbid conditions and prevent exacerbation or deterioration     Problem: Discharge Planning  Goal: Discharge to home or other facility with appropriate resources  07/04/2024 2157 by Cleatus Marty Macintosh, RN  Outcome: Progressing  07/04/2024 1751 by Sophia Rattan, RN  Outcome: Progressing  Flowsheets (Taken 07/04/2024 1751)  Discharge to home or other facility with appropriate resources:   Identify barriers to discharge with patient and caregiver   Arrange for needed discharge resources and transportation as appropriate   Identify discharge learning needs (meds, wound care, etc)     Problem: ABCDS Injury Assessment  Goal: Absence of physical injury  07/04/2024 2157 by Cleatus Marty Macintosh, RN  Outcome: Progressing  07/04/2024 1751 by Sophia Rattan, RN  Outcome: Progressing  Flowsheets (Taken 07/04/2024 1751)  Absence of Physical Injury: Implement safety measures based on patient assessment     Problem: Safety - Adult  Goal: Free from fall injury  07/04/2024 2157 by Cleatus Marty Macintosh, RN  Outcome: Progressing  07/04/2024 1751 by Sophia Rattan, RN  Outcome: Progressing  Flowsheets (Taken 07/04/2024 1751)  Free From Fall Injury: Instruct family/caregiver on patient safety     Problem: Skin/Tissue Integrity  Goal: Skin integrity remains intact  Description: 1.  Monitor for areas of redness and/or skin breakdown  2.  Assess vascular access sites hourly  3.  Every 4-6 hours  minimum:  Change oxygen saturation probe site  4.  Every 4-6 hours:  If on nasal continuous positive airway pressure, respiratory therapy assess nares and determine need for appliance change or resting period  07/04/2024 1751 by Sophia Rattan, RN  Outcome: Progressing  Flowsheets (Taken 07/04/2024 1751)  Skin Integrity Remains Intact:   Monitor for areas of redness and/or skin breakdown   Check visual cues for pain   Monitor skin under medical devices     "

## 2024-07-04 NOTE — ED Triage Notes (Addendum)
"  Increased SOB, recently diagnosed with pneumonia. Normal on 3L NC. Mild distress upon triage. Cough present. Fall this morning, leaving her with right shoulder pain without deformity.       Hx of COPD, Heart failure.   "

## 2024-07-04 NOTE — ED Notes (Signed)
"  Walking pulse ox obtained. Pt ambulatory to bathroom with rollaider. PT pulse ox down to 83%. Pt also unable get off toilet without assistance.   "

## 2024-07-04 NOTE — ED Notes (Signed)
"  Margarie made aware of Enote in chart.   "

## 2024-07-04 NOTE — ED Notes (Signed)
 "ED TO INPATIENT SBAR HANDOFF    Patient Name: Breanna Lester   DOB:  07-Sep-1960  63 y.o.   MRN:  [id number]  ED Room #:  ER06/06     Situation  Code Status: Full Code   Allergies: Latex  Weight: Patient Vitals for the past 96 hrs (Last 3 readings):   Weight   07/04/24 1204 122.5 kg (270 lb)       Arrived from: home    Chief Complaint:   Chief Complaint   Patient presents with    Shortness of Breath       Hospital Problem/Diagnosis:  Principal Problem:    CHF (congestive heart failure), NYHA class I, acute on chronic, combined (HCC)  Active Problems:    Acute respiratory failure (HCC)    Pneumonia due to infectious organism, unspecified laterality, unspecified part of lung  Resolved Problems:    * No resolved hospital problems. *      Mobility:   ED Fall Risk: Presents to emergency department  because of falls (Syncope, seizure, or loss of consciousness): Yes, Age > 70: No, Altered Mental Status, Intoxication with alcohol or substance confusion (Disorientation, impaired judgment, poor safety awaremess, or inability to follow instructions): No, Impaired Mobility: Ambulates or transfers with assistive devices or assistance; Unable to ambulate or transer.: Yes, Nursing Judgement: Yes   Fell in ED or prior to admission: no   Restraints: no     Sitter: no   Family/Caregiver Present: no    Background  History:   Past Medical History:   Diagnosis Date    Anxiety     Depression     Gastrointestinal disorder     Heart failure (HCC)     Hyperlipidemia     Hypertension     Liver disease 2009    liver failure    Pneumonia        Assessment    Abnormal Assessment Findings:   Imaging:   XR SHOULDER RIGHT (MIN 2 VIEWS)   Final Result   No acute abnormality.      Electronically signed by REDELL CUNNING      XR CHEST 1 VIEW   Final Result   1. Persistent right lower lobe infiltrate consistent with pneumonia follow-up to   resolution is suggested.   2. There is pulmonary vascular congestion and possible mild interstitial edema..       Electronically signed by Evalene JINNY Hoit        Abnormal labs:   Abnormal Labs Reviewed   CBC WITH AUTO DIFFERENTIAL - Abnormal; Notable for the following components:       Result Value    RBC 3.69 (*)     Hemoglobin 9.5 (*)     Hematocrit 33.1 (*)     MCH 25.7 (*)     MCHC 28.7 (*)     RDW 18.5 (*)     Platelets 149 (*)     All other components within normal limits   COMPREHENSIVE METABOLIC PANEL - Abnormal; Notable for the following components:    Sodium 146 (*)     CO2 31 (*)     Glucose 156 (*)     BUN/Creatinine Ratio 23 (*)     Calcium  8.5 (*)     AST 38 (*)     Alk Phosphatase 177 (*)     Total Protein 6.0 (*)     Albumin 2.9 (*)     Albumin/Globulin Ratio 0.9 (*)  All other components within normal limits   URINALYSIS WITH REFLEX TO CULTURE - Abnormal; Notable for the following components:    Urobilinogen, Urine 2.0 (*)     Mucus, UA Trace (*)     All other components within normal limits   TROPONIN - Abnormal; Notable for the following components:    Troponin T 18.8 (*)     All other components within normal limits   BRAIN NATRIURETIC PEPTIDE - Abnormal; Notable for the following components:    NT Pro-BNP 607 (*)     All other components within normal limits   TROPONIN - Abnormal; Notable for the following components:    Troponin T 18.7 (*)     All other components within normal limits         Vitals/MEWS: MEWS Score: 1  Level of Consciousness: Alert (0)   Vitals:    07/04/24 1202 07/04/24 1204 07/04/24 1330 07/04/24 1459   BP: (!) 147/62  (!) 120/91 (!) 111/91   Pulse: 64  68 62   Resp: 20  21 16    Temp: 97.5 F (36.4 C)      SpO2: 100%  100% 100%   Weight:  122.5 kg (270 lb)     Height:  1.575 m (5' 2)       DI:   Predictive Model Details          22 (Normal)  Factor Value    Calculated 07/04/2024 16:31 67% Age 78 years old    Deterioration Index Model 34% Sodium abnormal (146 mmol/L)     6% Pulse oximetry 100 %     2% Platelet count abnormal (149 K/uL)     2% Pulse 62     1% Hematocrit abnormal  (33.1 %)     1% WBC count 7.8 K/uL     1% Potassium 3.8 mmol/L     1% Temperature 97.5 F (36.4 C)     0% Respiratory rate 16     0% Systolic 111         FiO2 (%):   O2 Flow Rate:    3.5L - baseline.  Cardiac Rhythm:      Pain Scale: Pain Assessment  Pain Assessment: 0-10  Pain Level: 4  Pain Location: Shoulder  Last documented pain score: (0-10 scale) Pain Level: 4  Last documented pain medication administered:      Mental Status: oriented and alert  Orientation Level:    NIH Score:    C-SSRS: Risk of Suicide: No Risk    PO Status: Regular  Bedside swallow:    Glasgow Coma Scale (GCS): Glasgow Coma Scale  Eye Opening: Spontaneous  Best Verbal Response: Oriented  Best Motor Response: Obeys commands  Glasgow Coma Scale Score: 15    Active LDA's:   Peripheral IV 07/04/24 Right Antecubital (Active)     Pertinent or High Risk Medications/Drips: no   If Yes, please provide details:   Titratable drips: no   Pending Blood Product Administration: no     Sepsis: Sepsis Risk Score   Predictive Model Details          43 (Moderate)  Factor Value    Calculated 07/04/2024 16:18 15% Total Active Inpatient Meds 35    [facility] EARLY DETECTION OF SEPSIS VERSION 2 Model 9% O2 Delivery Method ROOM AIR     7% Albumin 2.9 g/dL     72% Age 88 years old     4% Has Imaging Procedure present     -  4% Number of Past ED Visits 9     3% Change in Creatinine 0.63 mg/dL -> 9.10 mg/dL     3% BMI 50.4     3% Antiemetic Admins Last 24 Hours 0     2% RBC Count 3.69 M/uL      Recent Labs     07/02/24  1955 07/04/24  1215   WBC 6.8 7.8     Blood Cultures Drawn: Yes   Repeat LA: Time Due   Antibiotic Given: Yes  Fluid Resuscitation:   VS x 2 post-fluid resuscitation: N/A    Recommendation    Plan for next 24 hours: IV abx   Additional Comments:      Isolation:  Pending orders:   Consults ordered: IP CONSULT TO PULMONOLOGY    Consulted provider:      If any further questions, please call Sending RN at ED  Electronically signed by: Electronically signed by  Vernell JONELLE Overman, RN on 07/04/2024 at 4:31 PM   "

## 2024-07-04 NOTE — ED Provider Notes (Signed)
 "[facility]Y DEPT  EMERGENCY DEPARTMENT HISTORY AND PHYSICAL EXAM      Date of evaluation: 07/04/2024  Patient Name: Breanna Lester [birth year]  MRN: [id number]  ED Provider: Lani JONELLE Molt, PA-C   Note Started: 12:33 PM EST 07/04/24  Chief Complaint:   Chief Complaint   Patient presents with    Shortness of Breath       HISTORY OF PRESENT ILLNESS   History Provided By: Patient, only     HPI: Breanna Lester is a 63 y.o. female with past medical history as listed below pertinent for CHF COPD recently diagnosed with pneumonia, reports she just completed a course of levofloxacin  presents emergency department for increased rest worsening shortness of breath.  Additionally patient reporting right shoulder pain after stumbling this morning after she tripped over her own feet states that she had to hold onto something to prevent from falling she has had pain discomfort in the right shoulder ever since.  Patient states that she has been taking all of her chronic medications and antibiotics as prescribed and came to the emergency department yesterday due to shortness of breath but left prior to treatment completion hoping that her symptoms would improve.  States that today her shortness of breath was worse so she came back to the ER for an evaluation.  Denies any current chest pain denies any hemoptysis denies any fevers chills night sweats nasal congestion.    PAST MEDICAL HISTORY   Past Medical History:  Past Medical History:   Diagnosis Date    Anxiety     Depression     Gastrointestinal disorder     Heart failure (HCC)     Hyperlipidemia     Hypertension     Liver disease 2009    liver failure    Pneumonia        Past Surgical History:  Past Surgical History:   Procedure Laterality Date    ADENOIDECTOMY  1972    BACK SURGERY      cement discs following a fall    CATARACT REMOVAL      CESAREAN SECTION  1986 and 1993    DILATION AND CURETTAGE OF UTERUS  1999    EGD TRANSORAL BIOPSY SINGLE/MULTIPLE  05/17/2009          HYSTERECTOMY (CERVIX STATUS UNKNOWN)  2008    TONSILLECTOMY  1972    UPPER GASTROINTESTINAL ENDOSCOPY N/A 04/17/2024    ESOPHAGOGASTRODUODENOSCOPY performed by Madlyn Fendt, MD at [facility] ENDOSCOPY       Family History:  Family History   Problem Relation Age of Onset    Cancer Mother     Heart Disease Father        Social History:  Social History     Tobacco Use    Smoking status: Former     Current packs/day: 0.00     Types: Cigarettes     Quit date: 02/28/2008     Years since quitting: 16.3    Smokeless tobacco: Never   Vaping Use    Vaping status: Never Used   Substance Use Topics    Alcohol use: Never    Drug use: Never       Allergies:  Allergies   Allergen Reactions    Latex Rash       PCP: Unknown, Provider    Current Meds:   Current Facility-Administered Medications   Medication Dose Route Frequency Provider Last Rate Last Admin    lidocaine  4 % external  patch 1 patch  1 patch TransDERmal NOW Constancia Lani SAUNDERS, PA-C   1 patch at 07/04/24 1447    ipratropium 0.5 mg-albuterol  2.5 mg (DUONEB ) nebulizer solution 1 Dose  1 Dose Inhalation Q4H RT Tobie Nilsa BIRCH, MD        NOREEN ON 07/05/2024] methylPREDNISolone  sodium succ (SOLU-MEDROL ) injection 40 mg  40 mg IntraVENous Daily Tobie Nilsa D, MD        piperacillin -tazobactam (ZOSYN ) 3,375 mg in sodium chloride  0.9 % 50 mL IVPB (addEASE)  3,375 mg IntraVENous Q8H Tobie Nilsa D, MD        acetaminophen  (TYLENOL ) tablet 325 mg  325 mg Oral Q8H PRN Tobie Nilsa D, MD        atorvastatin  (LIPITOR ) tablet 40 mg  40 mg Oral Nightly Tobie Nilsa D, MD        chlorthalidone  (HYGROTON ) tablet 25 mg  25 mg Oral Daily Tobie Nilsa D, MD        budesonide -formoterol  (SYMBICORT ) 80-4.5 MCG/ACT inhaler 2 puff  2 puff Inhalation BID RT Tobie Nilsa D, MD        And    tiotropium bromide  (SPIRIVA  RESPIMAT) 2.5 MCG/ACT inhaler 2 puff  2 puff Inhalation Daily RT Tobie Nilsa D, MD        guaiFENesin  (MUCINEX ) extended release tablet 600 mg  600 mg Oral BID Tobie Nilsa BIRCH, MD        [START ON  07/05/2024] lactobacillus (CULTURELLE) capsule 1 capsule  1 capsule Oral Daily with breakfast Tobie Nilsa D, MD        Melatonin TABS 1 tablet  1 tablet Oral Nightly Tobie Nilsa D, MD        metoprolol  tartrate (LOPRESSOR ) tablet 50 mg  50 mg Oral BID Tobie Nilsa D, MD        OLANZapine  (ZYPREXA ) tablet 10 mg  10 mg Oral Nightly Tobie Nilsa D, MD        pantoprazole  (PROTONIX ) tablet 40 mg  40 mg Oral Daily Tobie Nilsa D, MD        sertraline  (ZOLOFT ) tablet 100 mg  100 mg Oral Daily Tobie Nilsa D, MD        enoxaparin  (LOVENOX ) injection 120 mg  1 mg/kg SubCUTAneous BID Tobie Nilsa D, MD        bumetanide  (BUMEX ) injection 1 mg  1 mg IntraVENous BID Tobie Nilsa D, MD        doxycycline  hyclate (VIBRAMYCIN ) capsule 100 mg  100 mg Oral 2 times per day Tobie Nilsa BIRCH, MD         Current Outpatient Medications   Medication Sig Dispense Refill    sertraline  (ZOLOFT ) 100 MG tablet Take 1 tablet by mouth daily 30 tablet 3    predniSONE  (DELTASONE ) 50 MG tablet Take 1 tablet by mouth daily for 5 days 5 tablet 0    atorvastatin  (LIPITOR ) 40 MG tablet Take 1 tablet by mouth nightly 30 tablet 0    OLANZapine  (ZYPREXA ) 10 MG tablet Take 1 tablet by mouth nightly 30 tablet 0    metoprolol  tartrate (LOPRESSOR ) 50 MG tablet Take 1 tablet by mouth 2 times daily 60 tablet 0    bumetanide  (BUMEX ) 1 MG tablet Take 1 tablet by mouth daily 30 tablet 0    fluticasone -umeclidin-vilant (TRELEGY ELLIPTA ) 100-62.5-25 MCG/ACT AEPB inhaler Inhale 1 puff into the lungs daily 2 each 1    chlorthalidone  (HYGROTON ) 25 MG tablet Take 1 tablet by mouth daily  metFORMIN  (GLUCOPHAGE -XR) 500 MG extended release tablet Take 1 tablet by mouth daily (with breakfast)      apixaban  (ELIQUIS ) 5 MG TABS tablet Take 1 tablet by mouth 2 times daily 60 tablet 1    guaiFENesin  (MUCINEX ) 600 MG extended release tablet Take 1 tablet by mouth 2 times daily 15 tablet 1    pantoprazole  (PROTONIX ) 40 MG tablet Take 1 tablet by mouth 2 times daily (before meals)  (Patient taking differently: Take 1 tablet by mouth daily) 30 tablet 3    Melatonin 10 MG TABS Take 1 tablet by mouth at bedtime      lactobacillus (CULTURELLE) capsule Take 1 capsule by mouth daily (with breakfast) 30 capsule 0    ondansetron  (ZOFRAN -ODT) 4 MG disintegrating tablet Take 1 tablet by mouth every 8 hours as needed for Nausea or Vomiting (Patient not taking: Reported on 06/04/2024) 30 tablet 0    albuterol  sulfate HFA (VENTOLIN  HFA) 108 (90 Base) MCG/ACT inhaler Inhale 2 puffs into the lungs 4 times daily as needed for Wheezing 18 g 0    Blood Glucose Monitoring Suppl (BLOOD GLUCOSE MONITOR SYSTEM) w/Device KIT Pharmacist to identify preferred meter and strips. 1 kit 0    blood glucose monitor strips Test 1-2 times a day & as needed for symptoms of irregular blood glucose. Dispense sufficient amount for indicated testing frequency plus additional to accommodate PRN testing needs. Pharmacist to identify preferred brand. 100 strip 2    Lancets 30G MISC Test 1-2 times a day & as needed for symptoms of irregular blood glucose. Dispense sufficient amount for indicated testing frequency plus additional to accommodate PRN testing needs. Pharmacist to identify preferred brand. 100 each 2    acetaminophen  (TYLENOL ) 325 MG tablet Take 1 tablet by mouth every 6 hours as needed for Pain 40 tablet 0     PHYSICAL EXAM   Physical Exam  Vitals and nursing note reviewed.   Constitutional:       Appearance: Normal appearance. She is not toxic-appearing or diaphoretic.   HENT:      Head: Normocephalic and atraumatic.      Nose: Nose normal.      Mouth/Throat:      Mouth: Mucous membranes are moist.      Pharynx: Oropharynx is clear.   Eyes:      Extraocular Movements: Extraocular movements intact.      Conjunctiva/sclera: Conjunctivae normal.      Pupils: Pupils are equal, round, and reactive to light.   Cardiovascular:      Rate and Rhythm: Regular rhythm.      Pulses: Normal pulses.   Pulmonary:      Effort: Pulmonary  effort is normal.      Breath sounds: Examination of the right-middle field reveals rales. Examination of the left-middle field reveals rales. Examination of the right-lower field reveals decreased breath sounds and rales. Examination of the left-lower field reveals decreased breath sounds and rales. Decreased breath sounds and rales present.   Abdominal:      General: Abdomen is flat. There is no distension.      Palpations: Abdomen is soft.      Tenderness: There is no abdominal tenderness. There is no guarding.   Musculoskeletal:         General: Normal range of motion.      Cervical back: Normal range of motion and neck supple.   Skin:     General: Skin is warm and dry.  Capillary Refill: Capillary refill takes less than 2 seconds.   Neurological:      General: No focal deficit present.      Mental Status: She is alert and oriented to person, place, and time.   Psychiatric:         Mood and Affect: Mood normal.         Behavior: Behavior normal.         Thought Content: Thought content normal.         Judgment: Judgment normal.         SCREENINGS                No data recorded       LAB, EKG AND DIAGNOSTIC RESULTS   Labs:  Recent Results (from the past 12 hours)   D-Dimer, Quantitative    Collection Time: 07/04/24 12:14 PM   Result Value Ref Range    D-Dimer, Quant 0.42 <0.50 ug/ml(FEU)   CBC with Auto Differential    Collection Time: 07/04/24 12:15 PM   Result Value Ref Range    WBC 7.8 3.6 - 11.0 K/uL    RBC 3.69 (L) 3.80 - 5.20 M/uL    Hemoglobin 9.5 (L) 11.5 - 16.0 g/dL    Hematocrit 66.8 (L) 35.0 - 47.0 %    MCV 89.7 80.0 - 99.0 FL    MCH 25.7 (L) 26.0 - 34.0 PG    MCHC 28.7 (L) 30.0 - 36.5 g/dL    RDW 81.4 (H) 88.4 - 14.5 %    Platelets 149 (L) 150 - 400 K/uL    MPV 12.1 8.9 - 12.9 FL    Nucleated RBCs 0.0 0.0 PER 100 WBC    nRBC 0.00 0.00 - 0.01 K/uL    Neutrophils % 60.5 32.0 - 75.0 %    Lymphocytes % 29.3 12.0 - 49.0 %    Monocytes % 7.5 5.0 - 13.0 %    Eosinophils % 1.7 0.0 - 7.0 %    Basophils %  0.6 0.0 - 1.0 %    Immature Granulocytes % 0.4 0 - 0.5 %    Neutrophils Absolute 4.71 1.80 - 8.00 K/UL    Lymphocytes Absolute 2.29 0.80 - 3.50 K/UL    Monocytes Absolute 0.59 0.00 - 1.00 K/UL    Eosinophils Absolute 0.13 0.00 - 0.40 K/UL    Basophils Absolute 0.05 0.00 - 0.10 K/UL    Immature Granulocytes Absolute 0.03 0.00 - 0.04 K/UL    Differential Type Smear Scanned      RBC Comment Anisocytosis  1+        RBC Comment Hypochromia  1+       Comprehensive Metabolic Panel    Collection Time: 07/04/24 12:15 PM   Result Value Ref Range    Sodium 146 (H) 136 - 145 mmol/L    Potassium 3.8 3.5 - 5.1 mmol/L    Chloride 107 98 - 107 mmol/L    CO2 31 (H) 20 - 29 mmol/L    Anion Gap 7 2 - 14 mmol/L    Glucose 156 (H) 65 - 100 mg/dL    BUN 21 8 - 23 mg/dL    Creatinine 9.10 9.39 - 1.00 mg/dL    BUN/Creatinine Ratio 23 (H) 12 - 20      Est, Glom Filt Rate 73 >59 ml/min/1.66m2    Calcium  8.5 (L) 8.8 - 10.2 mg/dL    Total Bilirubin 0.7 0.0 - 1.2 mg/dL    AST 38 (H)  10 - 35 U/L    ALT 21 10 - 35 U/L    Alk Phosphatase 177 (H) 35 - 104 U/L    Total Protein 6.0 (L) 6.4 - 8.3 g/dL    Albumin 2.9 (L) 3.5 - 5.2 g/dL    Globulin 3.1 2.0 - 4.0 g/dL    Albumin/Globulin Ratio 0.9 (L) 1.1 - 2.2     COVID-19 & Influenza Combo    Collection Time: 07/04/24 12:15 PM    Specimen: Nasopharynx   Result Value Ref Range    SARS-CoV-2, PCR Not Detected Not Detected      Rapid Influenza A By PCR Not Detected Not Detected      Rapid Influenza B By PCR Not Detected Not Detected     Urinalysis with Reflex to Culture    Collection Time: 07/04/24 12:15 PM    Specimen: Urine   Result Value Ref Range    Color, UA Yellow/Straw      Appearance Clear Clear      Specific Gravity, UA 1.019 1.003 - 1.030      pH, Urine 6.0 5.0 - 8.0      Protein, UA Negative Negative mg/dL    Glucose, Ur Negative Negative mg/dL    Ketones, Urine Negative Negative mg/dL    Bilirubin, Urine Negative Negative      Blood, Urine Negative Negative      Urobilinogen, Urine 2.0 (H) 0.1  - 1.0 EU/dL    Nitrite, Urine Negative Negative      Leukocyte Esterase, Urine Negative Negative      WBC, UA 5-10 0 - 4 /hpf    RBC, UA 0-5 0 - 5 /hpf    Epithelial Cells, UA Few Few /lpf    BACTERIA, URINE Negative Negative /hpf    Urine Culture if Indicated Culture not indicated by UA result Culture not indicated by UA result      Mucus, UA Trace (A) Negative /lpf   Troponin    Collection Time: 07/04/24 12:15 PM   Result Value Ref Range    Troponin T 18.8 (H) 0 - 14 ng/L   Brain Natriuretic Peptide    Collection Time: 07/04/24 12:15 PM   Result Value Ref Range    NT Pro-BNP 607 (H) 0 - 125 pg/mL   Troponin    Collection Time: 07/04/24  2:10 PM   Result Value Ref Range    Troponin T 18.7 (H) 0 - 14 ng/L       EKG:.See ED Course Below    Radiologic Studies:  Radiographic images are visualized and preliminarily interpreted by the ED Provider with the following findings: See ED Course Below.     Interpretation per the Radiologist below, if available at the time of this note:  XR SHOULDER RIGHT (MIN 2 VIEWS)   Final Result   No acute abnormality.      Electronically signed by REDELL CUNNING      XR CHEST 1 VIEW   Final Result   1. Persistent right lower lobe infiltrate consistent with pneumonia follow-up to   resolution is suggested.   2. There is pulmonary vascular congestion and possible mild interstitial edema..      Electronically signed by Evalene JINNY Hoit           Records Reviewed: Not Applicable    MEDICAL DECISION MAKING and ED COURSE   3:41 PM Differential and Considerations of tests not ordered: Patient presents with Shortness of Breath. Will get CBC to evaluate  for anemia or bacterial infection, CMP to evaluate for renal/hepatic failure, Troponin to evaluate for ACS, BNP to evaluate fluid status, CXR to evaluate for PNA/effusion/edema and EKG to evaluate for ACS and tachycardia.  D-dimer obtained to rule out PE patient currently denies chest pain but reporting worsening shortness of breath.  CBC showing mild  anemia however appears to be chronic, no leukocytosis.  Chest x-ray showing persistent right lower lobe consolidation with pulmonary edema consistent with pneumonia CHF exacerbation.  Will initiate IV antibiotics to treat pneumonia due to failed outpatient management with p.o. antibiotics.  Patient treated with IV diuretics and DuoNebs.  CMP showing mild hypercapnia otherwise largely unremarkable.  Elevated BNP consistent with CHF exacerbation troponin elevated however appears stable and at baseline for patient repeat troponin stable slightly downtrending.  EKG negative for STEMI.  Rapid viral testing negative for COVID and influenza.  D-dimer negative.  Patient has increased shortness of breath with hypoxia during ambulation.  For diagnosis of pneumonia with CHF versus COPD exacerbation.  Discussed case with hospitalist who agrees to medical mission and management.    Vitals:    Vitals:    07/04/24 1202 07/04/24 1204 07/04/24 1330 07/04/24 1459   BP: (!) 147/62  (!) 120/91 (!) 111/91   Pulse: 64  68 62   Resp: 20  21 16    Temp: 97.5 F (36.4 C)      SpO2: 100%  100% 100%   Weight:  122.5 kg (270 lb)     Height:  1.575 m (5' 2)         ED COURSE  ED Course as of 07/04/24 1541   Fri Jul 04, 2024   1241 EKG interpreted by ED attending Dr. Buren, no evidence of STEMI showing a sinus rhythm with premature atrial complexes heart rate of 64. [TP]   1326 ED tech staff reporting walking pulse ox obtained patient ambulatory to the bathroom with rollator pulse ox  O2 sats dropped to 83% on 3 L nasal cannula patient unable to get off toilet without assistance reporting increased shortness of breath while ambulating. [TP]   1327 Chest x-ray showing pulmonary congestion and persistent right lower lobe consolidation. [TP]   1327 D-Dimer, Quant: 0.42  Dimer negative [TP]   1327 CBC showing mild anemia with hemoglobin at 9.5.  No leukocytosis. [TP]   1340 SARS-CoV-2, PCR: Not Detected [TP]   1340 Rapid Influenza A By PCR:  Not Detected [TP]   1340 Rapid Influenza B By PCR: Not Detected [TP]   1401 NT Pro-BNP(!): 607  Elevated BNP consistent with CHF exacerbation [TP]      ED Course User Index  [TP] Constancia Lani SAUNDERS, PA-C       Clinical Management Tools:  Not Applicable    Smoking Cessation: Not Applicable    Patient was given the following medications:  Medications   lidocaine  4 % external patch 1 patch (1 patch TransDERmal Patch Applied 07/04/24 1447)   ipratropium 0.5 mg-albuterol  2.5 mg (DUONEB ) nebulizer solution 1 Dose (has no administration in time range)   methylPREDNISolone  sodium succ (SOLU-MEDROL ) injection 40 mg (has no administration in time range)   piperacillin -tazobactam (ZOSYN ) 3,375 mg in sodium chloride  0.9 % 50 mL IVPB (addEASE) (has no administration in time range)   acetaminophen  (TYLENOL ) tablet 325 mg (has no administration in time range)   atorvastatin  (LIPITOR ) tablet 40 mg (has no administration in time range)   chlorthalidone  (HYGROTON ) tablet 25 mg (has no administration in time range)  budesonide -formoterol  (SYMBICORT ) 80-4.5 MCG/ACT inhaler 2 puff (has no administration in time range)     And   tiotropium bromide  (SPIRIVA  RESPIMAT) 2.5 MCG/ACT inhaler 2 puff (has no administration in time range)   guaiFENesin  (MUCINEX ) extended release tablet 600 mg (has no administration in time range)   lactobacillus (CULTURELLE) capsule 1 capsule (has no administration in time range)   Melatonin TABS 1 tablet (has no administration in time range)   metoprolol  tartrate (LOPRESSOR ) tablet 50 mg (has no administration in time range)   OLANZapine  (ZYPREXA ) tablet 10 mg (has no administration in time range)   pantoprazole  (PROTONIX ) tablet 40 mg (has no administration in time range)   sertraline  (ZOLOFT ) tablet 100 mg (has no administration in time range)   enoxaparin  (LOVENOX ) injection 120 mg (has no administration in time range)   bumetanide  (BUMEX ) injection 1 mg (has no administration in time range)   doxycycline   hyclate (VIBRAMYCIN ) capsule 100 mg (has no administration in time range)   methylPREDNISolone  sodium succ (SOLU-MEDROL ) 60 mg in sterile water  0.96 mL injection (60 mg IntraVENous Given 07/04/24 1250)   bumetanide  (BUMEX ) injection 1 mg (1 mg IntraVENous Given 07/04/24 1249)   ipratropium 0.5 mg-albuterol  2.5 mg (DUONEB ) nebulizer solution 1 Dose (1 Dose Inhalation Given 07/04/24 1355)   cefTRIAXone  (ROCEPHIN ) 1,000 mg in sterile water  10 mL IV syringe (1,000 mg IntraVENous Given 07/04/24 1444)       CONSULTS: See ED Course/MDM for further details.  IP CONSULT TO PULMONOLOGY     PROCEDURES   See ED course/ MDM for any additional procedures.  Procedures      SEPSIS REASSESSMENT & CRITICAL CARE TIME   SEPSIS REASSESSMENT: No suspicion of bacterial infection and not having 2 SIRS during this visit.    Critical Care: Critical Care Time: There was a high probability of life-threatening clinical deterioration in the patient's condition requiring my urgent intervention.  I personally saw the patient and independently provided 40 minutes of non-concurrent critical care out of the total shared critical care time provided, excluding separately reportable procedures.   CLINICAL IMPRESSIONS     1. Pneumonia of right lower lobe due to infectious organism    2. Acute on chronic congestive heart failure, unspecified heart failure type (HCC)    3. COPD exacerbation (HCC)       SDOH/DISPOSITION/PLAN   Social Determinants affecting Treatment Plan: None    DISPOSITION Admitted 07/04/2024 03:20:27 PM   DISPOSITION CONDITION Stable         Discharge Note: The patient is stable for discharge home. The signs, symptoms, diagnosis, and discharge instructions have been discussed, understanding conveyed, and agreed upon. The patient is to follow up as recommended or return to ER should their symptoms worsen.      PATIENT REFERRED TO:  No follow-up provider specified.      DISCHARGE MEDICATIONS:     Medication List        CONTINUE taking  these medications      Blood Glucose Monitor System w/Device Kit  Pharmacist to identify preferred meter and strips.     Lancets 30G Misc  Test 1-2 times a day & as needed for symptoms of irregular blood glucose. Dispense sufficient amount for indicated testing frequency plus additional to accommodate PRN testing needs. Pharmacist to identify preferred brand.            ASK your doctor about these medications      acetaminophen  325 MG tablet  Commonly known as: TYLENOL   Take 1 tablet by mouth every 6 hours as needed for Pain     albuterol  sulfate HFA 108 (90 Base) MCG/ACT inhaler  Commonly known as: Ventolin  HFA  Inhale 2 puffs into the lungs 4 times daily as needed for Wheezing     apixaban  5 MG Tabs tablet  Commonly known as: ELIQUIS   Take 1 tablet by mouth 2 times daily     atorvastatin  40 MG tablet  Commonly known as: LIPITOR   Take 1 tablet by mouth nightly     blood glucose test strips  Test 1-2 times a day & as needed for symptoms of irregular blood glucose. Dispense sufficient amount for indicated testing frequency plus additional to accommodate PRN testing needs. Pharmacist to identify preferred brand.     bumetanide  1 MG tablet  Commonly known as: BUMEX   Take 1 tablet by mouth daily     chlorthalidone  25 MG tablet  Commonly known as: HYGROTON      guaiFENesin  600 MG extended release tablet  Commonly known as: MUCINEX   Take 1 tablet by mouth 2 times daily     lactobacillus capsule  Take 1 capsule by mouth daily (with breakfast)     Melatonin 10 MG Tabs     metFORMIN  500 MG extended release tablet  Commonly known as: GLUCOPHAGE -XR     metoprolol  tartrate 50 MG tablet  Commonly known as: LOPRESSOR   Take 1 tablet by mouth 2 times daily     OLANZapine  10 MG tablet  Commonly known as: ZYPREXA   Take 1 tablet by mouth nightly     ondansetron  4 MG disintegrating tablet  Commonly known as: ZOFRAN -ODT  Take 1 tablet by mouth every 8 hours as needed for Nausea or Vomiting     pantoprazole  40 MG tablet  Commonly known  as: PROTONIX   Take 1 tablet by mouth 2 times daily (before meals)     predniSONE  50 MG tablet  Commonly known as: DELTASONE   Take 1 tablet by mouth daily for 5 days     sertraline  100 MG tablet  Commonly known as: ZOLOFT   Take 1 tablet by mouth daily     Trelegy Ellipta  100-62.5-25 MCG/ACT Aepb inhaler  Generic drug: fluticasone -umeclidin-vilant  Inhale 1 puff into the lungs daily                DISCONTINUED MEDICATIONS:  Current Discharge Medication List          I am the Primary Clinician of Record. Lani JONELLE Molt, PA-C (electronically signed)    (Please note that parts of this dictation were completed with voice recognition software. Quite often unanticipated grammatical, syntax, homophones, and other interpretive errors are inadvertently transcribed by the computer software. Please disregards these errors. Please excuse any errors that have escaped final proofreading.)     Molt Lani JONELLE, [place]  07/04/24 1541    "

## 2024-07-04 NOTE — Progress Notes (Addendum)
 "Pulmonary Disease Navigator Note  [facility]    Current GOLD classification for Breanna Lester    Patient's chart was reviewed by Pulmonary Disease Navigator for compliance with prescribed treatment with Global Initiative For Chronic Obstructive Lung Disease (GOLD).    Please, review beneath recommendations for pharmacological treatment for patient with obstructive lung disease.     Current Pharmacological Treatment:      Current orders include Duoneb  q4h.  Please note patient has Trelegy Ellipta  100-62.5-25 mcg/act twice daily at home and does not have current orders for any ICS-LABA-LAMA medication here at this time    Current eosinophil count: 0.13  Recorded domestic exacerbations past 12 months: 4          Combination  ICS-LABA Inhaler Acceptable   Therapy  Device For Use   Salmeterol/fluticasone  Advair   Diskus    Vilanterol/fluticasone   Breo  Ellipta    Formoterol /mometasone  Dulera MDI Yes   Formoterol /budesonide   Symbicort  MDI Yes   Triple Therapy Recommended (For Group E) ICS-LABA-LAMA     Fluticasone /umeclidinium/vilanterol  Trelegy  Ellipta    Budesonide /glycopyrrolate/formoterol  fumarate  Breztri  MDI Yes   Recommended (For Group A & B) LAMA/LABA     Vilanterol/umeclidinium  Anoro  Ellipta    Olodaterol/tiotropium  Stiolto  Respimat Yes   Formoterol /glycopyrronium  Bevespi  MDI Yes   Aclidinium/formoterol  Duaklir  Pressair      *Nebulizer Options    LAMA LABA ICS   Yupelri  Brovanna Budesonide     Performist      The CAT provides a reliable measure of the impact of COPD on a patient's health status. Range of CAT scores from 0-40. Higher scores denote a more severe impact of COPD on a patients life. Scores <10 have a low impact, 10-20 medium, 21-30 high and >30 very high impact, requiring gradually more interventions.     Current recorded COPD Assessment Tool (CAT) score of  Cough Assessment: 3  Phlegm Assessment: 0  Chest tightness: 4  Walking on an incline: 5  Home  Activities: 2  Confident Leaving The Home: 2  Sleeping Soundly: 3 (has home NIV but unable to tolerate wearing it)  Have Energy: 3  Assessment Score: 22       None pharmacological recommendations:     []  ABG  []   []  Sleep study  [x]  NIV (patient has home NIV that she states she cannot tolerate)  []  Pulmonary Rehab  [x]  PFT on discharge  []  Smoking Cessation  [x] Pulmonary Consult (patient followed by Drs Mauricio Orlan Kerrie armin Fawn)  [] Palliative Care Consult  [] Low Dose Lung CT (LDCT)      Patient Education:      Understanding COPD  Exacerbations/Action Plan  Triggers  Precautions  Smoking Cessation  Inhaler/Nebulizer use (Spacer)  Breathing Techniques  Managing Stress      Comments:   Breanna Lester presents with history of anxiety, CHF, HTN, HLD, PNA, and COPD.  Breanna Lester is on home oxygen at 2-3 lpm baseline.  She has a home NIV unit, but states she is unable to tolerate wearing it and needs to return it as it is expensive.  Breanna Lester and her daughter are currently living in a hotel, so she does not have room for portable oxygen tanks.  When she goes out for errands or appointments, she does not have an oxygen source.  Breanna Lester has been followed by Drs Mauricio Kerrie Orlan and Haider for her pulmonary needs.  Breanna Lester stated she had an appointment with Dr Mauricio yesterday, but was unable to keep the appointment due to feeling bad.  Breanna Lester stated she had come to the ER last evening due to her increasing SOB, but left without being seen and returned today.  Breanna Lester has a home nebulizer (Albuterol ) and a Trelegy inhaler, but stated she does NOT have a rescue inhaler at this time. Prior to discharge, Breanna Lester will need prescriptions for a new rescue inhaler, and would benefit from Meds 2 Beds to assist in her obtaining her medications.  Breanna Lester was educated on the importance of her NIV, and the need to wear oxygen while running errands and such.  Will continue to follow.  Breanna Lester does have a good  understanding of her disease process but does have a lot of financial constraints causing missed meds and adherence to her oxygen and NIV use.     Time spent in review and at bedside with patient: 40 minutes  Electronically signed by Dickey Alisa Clause, RRT on 07/04/24 at 2:59 PM EST  "

## 2024-07-04 NOTE — ED Notes (Signed)
 Report to Vernell, CHARITY FUNDRAISER.

## 2024-07-04 NOTE — Plan of Care (Signed)
"    Problem: Chronic Conditions and Co-morbidities  Goal: Patient's chronic conditions and co-morbidity symptoms are monitored and maintained or improved  Outcome: Progressing  Flowsheets (Taken 07/04/2024 1751)  Care Plan - Patient's Chronic Conditions and Co-Morbidity Symptoms are Monitored and Maintained or Improved:   Monitor and assess patient's chronic conditions and comorbid symptoms for stability, deterioration, or improvement   Collaborate with multidisciplinary team to address chronic and comorbid conditions and prevent exacerbation or deterioration     Problem: Discharge Planning  Goal: Discharge to home or other facility with appropriate resources  Outcome: Progressing  Flowsheets (Taken 07/04/2024 1751)  Discharge to home or other facility with appropriate resources:   Identify barriers to discharge with patient and caregiver   Arrange for needed discharge resources and transportation as appropriate   Identify discharge learning needs (meds, wound care, etc)     Problem: ABCDS Injury Assessment  Goal: Absence of physical injury  Outcome: Progressing  Flowsheets (Taken 07/04/2024 1751)  Absence of Physical Injury: Implement safety measures based on patient assessment     Problem: Safety - Adult  Goal: Free from fall injury  Outcome: Progressing  Flowsheets (Taken 07/04/2024 1751)  Free From Fall Injury: Instruct family/caregiver on patient safety     Problem: Skin/Tissue Integrity  Goal: Skin integrity remains intact  Description: 1.  Monitor for areas of redness and/or skin breakdown  2.  Assess vascular access sites hourly  3.  Every 4-6 hours minimum:  Change oxygen saturation probe site  4.  Every 4-6 hours:  If on nasal continuous positive airway pressure, respiratory therapy assess nares and determine need for appliance change or resting period  Outcome: Progressing  Flowsheets (Taken 07/04/2024 1751)  Skin Integrity Remains Intact:   Monitor for areas of redness and/or skin breakdown   Check visual  cues for pain   Monitor skin under medical devices     "

## 2024-07-04 NOTE — Progress Notes (Signed)
"  Pts home meds placed in security bag and walked down to security. Receipt from bag placed in pts chart. Bag #: [id number].   "

## 2024-07-04 NOTE — Care Coordination (Addendum)
"     07/04/24 1606   Service Assessment   Information Provided By Patient   Patient Orientation Alert;Oriented   Cognition No Apparent Deficit   Primary Caregiver Self   Support Systems Children   Patient's Healthcare Decision Maker is: Legal Next of Kin   Last Visit to PCP Within last two years   Prior Functional Level Independent in ADLs/IADLs   History of falls? 0   Current Functional Level at Time of Initial Assessment Unable to Assess   Can patient return to prior living arrangement Yes   Ability to make needs known: Good   Family able to assist with home care needs: Yes   Other than yourself, who should be included in your transition plan for discharge from the hospital? jessica daughter   [facility]   Discharge Planning    Location Prior to Acute Admission Other (Comment)  (hotel)   Lives With Daughter   Current Services Prior To Admission Other (Comment)  (oxygen)   Home Layout One level   Home Access Level entry   Estate Agent At/After Discharge   Who will provide transportation at discharge? Family   Social/Functional History   Home Equipment Oxygen     Advance Care Planning     General Advance Care Planning (ACP) Conversation    Date of Conversation: 07/04/2024  Conducted with: Patient with Decision Making Capacity  Other persons present: None    Healthcare Decision Maker:   Primary Decision Maker: Knippel,Jessica - Child [phone]       Content/Action Overview:  Has ACP document(s) on file - reflects the patient's care preferences  Reviewed DNR/DNI and patient elects Full Code (Attempt Resuscitation)        Length of Voluntary ACP Conversation in minutes:  <16 minutes (Non-Billable)    Elspeth Daring         CM met with patient in room.    Home o2 at via 3lpm ; Also has NIV and and nubulizer all via [facility]  NO current pcp  NO HD  Will need ride home at [facility] in [place]  "

## 2024-07-04 NOTE — ED Notes (Signed)
"  Blood cultures obtained PTA of abx  "

## 2024-07-05 LAB — CBC WITH AUTO DIFFERENTIAL
Basophils %: 0.1 % (ref 0.0–1.0)
Basophils Absolute: 0.01 K/UL (ref 0.00–0.10)
Eosinophils %: 0 % (ref 0.0–7.0)
Eosinophils Absolute: 0 K/UL (ref 0.00–0.40)
Hematocrit: 31.1 % — ABNORMAL LOW (ref 35.0–47.0)
Hemoglobin: 8.9 g/dL — ABNORMAL LOW (ref 11.5–16.0)
Immature Granulocytes %: 0.5 % (ref 0–0.5)
Immature Granulocytes Absolute: 0.04 K/UL (ref 0.00–0.04)
Lymphocytes %: 16 % (ref 12.0–49.0)
Lymphocytes Absolute: 1.22 K/UL (ref 0.80–3.50)
MCH: 25 pg — ABNORMAL LOW (ref 26.0–34.0)
MCHC: 28.6 g/dL — ABNORMAL LOW (ref 30.0–36.5)
MCV: 87.4 FL (ref 80.0–99.0)
MPV: 12 FL (ref 8.9–12.9)
Monocytes %: 7 % (ref 5.0–13.0)
Monocytes Absolute: 0.53 K/UL (ref 0.00–1.00)
Neutrophils %: 76.4 % — ABNORMAL HIGH (ref 32.0–75.0)
Neutrophils Absolute: 5.8 K/UL (ref 1.80–8.00)
Nucleated RBCs: 0 /100{WBCs}
Platelets: 130 K/uL — ABNORMAL LOW (ref 150–400)
RBC: 3.56 M/uL — ABNORMAL LOW (ref 3.80–5.20)
RDW: 17.9 % — ABNORMAL HIGH (ref 11.5–14.5)
WBC: 7.6 K/uL (ref 3.6–11.0)
nRBC: 0 K/uL (ref 0.00–0.01)

## 2024-07-05 LAB — MRSA BY PCR: MRSA by PCR: NOT DETECTED

## 2024-07-05 LAB — EKG 12-LEAD
Atrial Rate: 64 {beats}/min
P Axis: 55 degrees
P-R Interval: 154 ms
Q-T Interval: 476 ms
QRS Duration: 90 ms
QTc Calculation (Bazett): 491 ms
R Axis: 65 degrees
T Axis: -2 degrees
Ventricular Rate: 64 {beats}/min

## 2024-07-05 LAB — POCT GLUCOSE
POC Glucose: 376 mg/dL — ABNORMAL HIGH (ref 65–100)
POC Glucose: 171 mg/dL — ABNORMAL HIGH (ref 65–100)
POC Glucose: 257 mg/dL — ABNORMAL HIGH (ref 65–100)
POC Glucose: 249 mg/dL — ABNORMAL HIGH (ref 65–100)

## 2024-07-05 LAB — BASIC METABOLIC PANEL
Anion Gap: 9 mmol/L (ref 2–14)
BUN/Creatinine Ratio: 25 — ABNORMAL HIGH (ref 12–20)
BUN: 20 mg/dL (ref 8–23)
CO2: 33 mmol/L — ABNORMAL HIGH (ref 20–29)
Calcium: 8.3 mg/dL — ABNORMAL LOW (ref 8.8–10.2)
Chloride: 102 mmol/L (ref 98–107)
Creatinine: 0.8 mg/dL (ref 0.60–1.00)
Est, Glom Filt Rate: 83 ml/min/1.73m2 (ref >59)
Glucose: 204 mg/dL — ABNORMAL HIGH (ref 65–100)
Potassium: 3.4 mmol/L — ABNORMAL LOW (ref 3.5–5.1)
Sodium: 145 mmol/L (ref 136–145)

## 2024-07-05 LAB — PROCALCITONIN
Procalcitonin: 0.07 ng/mL
Procalcitonin: 0.1 ng/mL

## 2024-07-05 MED ORDER — KETOROLAC TROMETHAMINE 15 MG/ML IJ SOLN
15 | Freq: Once | INTRAMUSCULAR | Status: AC
Start: 2024-07-05 — End: 2024-07-04
  Administered 2024-07-05: 05:00:00 15 mg via INTRAVENOUS

## 2024-07-05 MED ORDER — PIPERACILLIN SOD-TAZOBACTAM SO 4.5 (4-0.5) G IV SOLR
4.5 | Freq: Three times a day (TID) | INTRAVENOUS | Status: DC
Start: 2024-07-05 — End: 2024-07-06
  Administered 2024-07-06 (×2): 4500 mg via INTRAVENOUS

## 2024-07-05 MED ORDER — KETOROLAC TROMETHAMINE 15 MG/ML IJ SOLN
15 | Freq: Once | INTRAMUSCULAR | Status: AC
Start: 2024-07-05 — End: 2024-07-05
  Administered 2024-07-05: 17:00:00 15 mg via INTRAVENOUS

## 2024-07-05 MED FILL — ENOXAPARIN SODIUM 120 MG/0.8ML IJ SOSY: 120 MG/0.8ML | INTRAMUSCULAR | Qty: 0.8 | Fill #0

## 2024-07-05 MED FILL — SERTRALINE HCL 50 MG PO TABS: 50 mg | ORAL | Qty: 2 | Fill #0

## 2024-07-05 MED FILL — PIPERACILLIN SOD-TAZOBACTAM SO 3.375 (3-0.375) G IV SOLR: 3.375 (3-0.375) g | INTRAVENOUS | Qty: 3375 | Fill #0

## 2024-07-05 MED FILL — IPRATROPIUM-ALBUTEROL 0.5-2.5 (3) MG/3ML IN SOLN: 0.5-2.5 (3) MG/3ML | RESPIRATORY_TRACT | Qty: 3 | Fill #0

## 2024-07-05 MED FILL — DOXYCYCLINE HYCLATE 100 MG PO CAPS: 100 mg | ORAL | Qty: 1 | Fill #0

## 2024-07-05 MED FILL — KETOROLAC TROMETHAMINE 15 MG/ML IJ SOLN: 15 mg/mL | INTRAMUSCULAR | Qty: 1 | Fill #0

## 2024-07-05 MED FILL — INSULIN LISPRO 100 UNIT/ML IJ SOLN: 100 [IU]/mL | INTRAMUSCULAR | Qty: 4 | Fill #0

## 2024-07-05 MED FILL — METOPROLOL TARTRATE 50 MG PO TABS: 50 mg | ORAL | Qty: 1 | Fill #0

## 2024-07-05 MED FILL — MELATONIN 5 MG PO TABS: 5 mg | ORAL | Qty: 1 | Fill #0

## 2024-07-05 MED FILL — BUDESONIDE-FORMOTEROL FUMARATE 80-4.5 MCG/ACT IN AERO: 80-4.5 MCG/ACT | RESPIRATORY_TRACT | Qty: 0.23 | Fill #0

## 2024-07-05 MED FILL — MUCUS RELIEF ER 600 MG PO TB12: 600 mg | ORAL | Qty: 1 | Fill #0

## 2024-07-05 MED FILL — BUMETANIDE 0.25 MG/ML IJ SOLN: 0.25 mg/mL | INTRAMUSCULAR | Qty: 4 | Fill #0

## 2024-07-05 MED FILL — TYLENOL 325 MG PO TABS: 325 mg | ORAL | Qty: 1 | Fill #0

## 2024-07-05 MED FILL — ATORVASTATIN CALCIUM 40 MG PO TABS: 40 mg | ORAL | Qty: 1 | Fill #0

## 2024-07-05 MED FILL — INSULIN LISPRO 100 UNIT/ML IJ SOLN: 100 [IU]/mL | INTRAMUSCULAR | Qty: 8 | Fill #0

## 2024-07-05 MED FILL — METHYLPREDNISOLONE SODIUM SUCC 40 MG IJ SOLR: 40 mg | INTRAMUSCULAR | Qty: 40 | Fill #0

## 2024-07-05 MED FILL — CULTURELLE PO CAPS: ORAL | Qty: 1 | Fill #0

## 2024-07-05 MED FILL — EFFER-K 20 MEQ PO TBEF: 20 meq | ORAL | Qty: 2 | Fill #0

## 2024-07-05 MED FILL — OLANZAPINE 10 MG PO TABS: 10 mg | ORAL | Qty: 1 | Fill #0

## 2024-07-05 MED FILL — INSULIN LISPRO 100 UNIT/ML IJ SOLN: 100 [IU]/mL | INTRAMUSCULAR | Qty: 2 | Fill #0

## 2024-07-05 MED FILL — PANTOPRAZOLE SODIUM 40 MG PO TBEC: 40 mg | ORAL | Qty: 1 | Fill #0

## 2024-07-05 NOTE — Progress Notes (Signed)
 "    Hospitalist Progress Note               Daily Progress Note: 07/05/2024      Hospital Day: 2     Chief complaint:   Chief Complaint   Patient presents with    Shortness of Breath        Brief HPI/ Hospital course to date:  Breanna Lester is a 63 y.o.  female with PMHx significant for HTN, HLD, HFpEF, COPD on 3 L, atrial fibrillation on Eliquis , DM2, liver cirrhosis, and other chronic conditions who presents due to shortness of breath.     Patient states that her symptoms started a couple of days ago.  She reports a nonproductive cough.  No fevers.  No nausea/vomiting.  No sick contacts.  No recent medication changes.  Denies recreational drug use.  Had a 20-pack-year history before.  Reports quitting smoking 16 years ago.  Denies alcohol use.  States that she is in talks with her pulmonologist for potential BiPAP but has been refusing.  Reports being on 3 L at home.     Per chart review, patient was admitted 10/14-10/27 for shortness of breath.  During that admission, patient was noted to be short of breath and had pneumonia.  She failed BiPAP and was intubated and transferred to ICU.  Blood culture showed Staph epidermidis at the time but no other organism was found.  Stroke alert was also called was confused.  CT head and CTA were negative.  MRI unable to do due to patient's claustrophobia.  She was transferred to the ICU and had worsening hypercapnia placed on BiPAP.  She continued to do well and pulmonology cleared for discharge.     In the ED, patient was afebrile and vitals were stable except patient became hypoxic at 86% on room air.  CBC showed anemia of 9.5 with baseline around 10-11 along with mild thrombocytopenia of 149.  BMP showed hyponatremia of 146.  ALP was mildly elevated along with AST chronically.  BNP was 607.  Troponin was elevated at 18.8.  D-dimer was negative.  Blood cultures were obtained.  COVID and flu was negative.  UA showed no sign of infection.  CXR showed persistent right lower  lobe infiltrate consistent with pneumonia along with pulmonary vascular congestion possible mild edema.  X-ray of right shoulder showed no acute findings.  Patient was given Bumex  1 mg, Rocephin , IV steroids, and azithromycin .     We were asked to admit for work up and evaluation of the above problems.     Patient continued to do well on Zosyn .  Cultures pending.  Pulm following appreciate recs    --------  Patient is seen today for follow-up.   She is sitting upright in no acute distress.  States her coughing has improved and shortness of breath.  She is on her baseline 3 L.  Denies any new pain.  States that her hernia site sometimes hurts but not currently.  States that she was told is nonoperable.  Per patient, 1 episode of vomiting that was nonbilious nonbloody.  Per nursing, this was not witnessed at all.  Having bowel movements and passing gas.  Denies headache, chest pain/palpitations, shortness of breath, abdominal pain, or urinary symptoms.  Nursing at bedside with no concern.      All ROS negative otherwise mentioned above.      Assessment and Plan:  Acute respiratory failure with hypoxia  Pneumonia  - Patient presented with shortness  of breath with recent admission for pneumonia  - BNP 607  - COVID and flu negative  - Blood cultures pending  -MRSA swab negative  -ABG reviewed  -Respiratory culture ordered  - CXR: Persistent right lower lobe infiltrate consistent with pneumonia and pulmonary vascular congestion with mild edema  - Given recent hospitalization and failure with levofloxacin  and recurrent pneumonia, will place on Zosyn  for now  - Mild QTc prolongation.  Will do doxycycline  for now     CHF exacerbation  - CXR as above  - Echo 04/2024: EF 70 to 75%, normal diastolic function, moderate 1 to 2 cm localized pericardial effusion  - Started on Bumex  1 mg twice daily IV  --2200 out and remove today likely tomorrow  - Daily weights, strict I's and O's     Elevated troponin  - Troponin trending down  -  EKG with no sign of ischemia  -Recent echo reviewed  - Denies chest pain  - Continue telemetry     HTN  - BPs reviewed and acceptable  - Continue home medication: Chlorthalidone      HLD  - Continue home medication: Atorvastatin  40 mg     COPD  - Patient has shortness of breath and coughing  - DuoNebs every 4, IV steroids  - Continue Symbicort      Hypernatremia  -Na 146  -Will monitor in AM labs and do further workup if worsening along with IV hydration but will hold off for now given slightly volume overload     Atrial fibrillation on Eliquis   - Currently rate controlled  - Continue home medication: Metoprolol   tartrate 50 twice daily  - Will do Lovenox  treatment dose for now     Anemia of chronic disease  - Hemoglobin 8.9  with baseline around 9-10  - No signs of bleeding  - Continue to monitor in a.m. 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  Nursing to give potassium  -Continue to monitor closely in a.m. labs     DM2  - POC glucose reviewed  - Continue POC glucose checks, sliding scale, hypoglycemia protocol     Mood disorder  - Continue home medication: Sertraline  100 and olanzapine         Code status:     Social determinants of health: none      Estimated discharge date//time frame/disposition:      Barriers to discharge:       Objective:   Physical Exam:     BP 128/69   Pulse 70   Temp 97.5 F (36.4 C) (Oral)   Resp 18   Ht 1.575 m (5' 2)   Wt 114 kg (251 lb 5.2 oz)   SpO2 94%   BMI 45.97 kg/m  O2 Flow Rate (L/min): 3 L/min O2 Device: Nasal cannula    Temp (24hrs), Avg:97.9 F (36.6 C), Min:97.5 F (36.4 C), Max:98.1 F (36.7 C)    No intake/output data recorded.   11/13 1901 - 11/15 0700  In: -   Out: 2200 [Urine:2200]    Mode Rate TV Press PEEP FiO2 PIP Min. Vent                              General: In no acute  distress  HEENT: NC/AT. No obvious deformities.Nares patent. Oral mucosa moist.   Cardiovascular: S1, S2 present. No murmurs noted.   Respiratory: Normal respiratory effort.  Breath sounds diminished on right lower lobe.  No retractions noted  Abdomen: BS+. Soft, nontender. No TTP in all quadrants. No guarding or rigidity   Lower Extremity: Trace edema bilaterally   Neurological: CN III-XII grossly intact. Muscle strength 5/5 in UE and 5/5 in LE b/l. No facial drooping or slurring of words.   Psychiatric: Appropriate mood and affect         Data Review:     Medications reviewed  Current Facility-Administered Medications   Medication Dose Route Frequency    ipratropium 0.5 mg-albuterol  2.5 mg (DUONEB ) nebulizer solution 1 Dose  1 Dose Inhalation Q4H RT    methylPREDNISolone  sodium succ (SOLU-MEDROL ) injection 40 mg  40 mg IntraVENous Daily    piperacillin -tazobactam (ZOSYN ) 3,375 mg in sodium chloride  0.9 % 50 mL IVPB (addEASE)  3,375 mg IntraVENous Q8H    acetaminophen  (TYLENOL ) tablet 325 mg  325 mg Oral Q8H PRN    atorvastatin  (LIPITOR ) tablet 40 mg  40 mg Oral Nightly    chlorthalidone  (HYGROTON ) tablet 25 mg  25 mg Oral Daily    budesonide -formoterol  (SYMBICORT ) 80-4.5 MCG/ACT inhaler 2 puff  2 puff Inhalation BID RT    guaiFENesin  (MUCINEX ) extended release tablet 600 mg  600 mg Oral BID    lactobacillus (CULTURELLE) capsule 1 capsule  1 capsule Oral Daily with breakfast    melatonin tablet 5 mg  1 tablet Oral Nightly    metoprolol  tartrate (LOPRESSOR ) tablet 50 mg  50 mg Oral BID    OLANZapine  (ZYPREXA ) tablet 10 mg  10 mg Oral Nightly    pantoprazole  (PROTONIX ) tablet 40 mg  40 mg Oral Daily    sertraline  (ZOLOFT ) tablet 100 mg  100 mg Oral Daily    enoxaparin  (LOVENOX ) injection 120 mg  1 mg/kg SubCUTAneous BID    bumetanide  (BUMEX ) injection 1 mg  1 mg IntraVENous BID    doxycycline  hyclate (VIBRAMYCIN ) capsule 100 mg  100 mg Oral 2 times per day    sodium chloride  flush 0.9 % injection 5-40 mL  5-40 mL  IntraVENous 2 times per day    sodium chloride  flush 0.9 % injection 5-40 mL  5-40 mL IntraVENous PRN    0.9 % sodium chloride  infusion   IntraVENous PRN    potassium chloride  (KLOR-CON  M) extended release tablet 40 mEq  40 mEq Oral PRN    Or    potassium bicarb-citric acid  (EFFER-K ) effervescent tablet 40 mEq  40 mEq Oral PRN    Or    potassium chloride  10 mEq/100 mL IVPB (Peripheral Line)  10 mEq IntraVENous PRN    magnesium  sulfate 2000 mg in 50 mL IVPB premix  2,000 mg IntraVENous PRN    polyethylene glycol (GLYCOLAX ) packet 17 g  17 g Oral Daily PRN    acetaminophen  (TYLENOL ) tablet 650 mg  650 mg Oral Q6H PRN    Or    acetaminophen  (TYLENOL ) suppository 650 mg  650 mg Rectal Q6H PRN    insulin  lispro (HUMALOG ,ADMELOG ) injection vial 0-8 Units  0-8 Units SubCUTAneous 4x Daily AC & HS    glucose chewable tablet 16 g  4 tablet Oral PRN    dextrose  bolus 10% 125 mL  125 mL IntraVENous PRN    Or    dextrose  bolus 10% 250 mL  250 mL IntraVENous PRN    glucagon   injection 1 mg  1 mg SubCUTAneous PRN    dextrose  10 % infusion   IntraVENous Continuous PRN         All relevant laboratory and imaging results reviewed in [facility] electronic medical records.            Discussion/MDM:     []  High (any 2)    A. Problems (any 1)  [x]  Acute/Chronic Illness/injury posing threat to life or bodily function:    []  Severe exacerbation of chronic illness:    ---------------------------------------------------------------------  B. Risk of Treatment (any 1)   [x]  Drugs/treatments that require intensive monitoring for toxicity include:    []  IV ABX requiring serial renal monitoring for nephrotoxicity:     []  IV Narcotic analgesia for adverse drug reaction  [x]  Aggressive IV diuresis requiring serial monitoring for renal impairment and electrolyte derangements  []  Critical electrolyte abnormalities requiring IV replacement and close serial monitoring  [x]  SQ Insulin  SS- monitoring serial FSBS for Hypoglycemic adverse drug reaction  []   Other -   []  Change in code status:    []  Decision to escalate care:    []  Major surgery/procedure with associated risk factors:    ----------------------------------------------------------------------  C. Data (any 2)  [x]  Discussed current management and discharge planning options with Case Management.  []  Discussed management of the case with:    []  Telemetry personally reviewed and interpreted as documented above    []  Imaging personally reviewed and interpreted, includes:    [x]  Data Review (any 3)  [x]  All available Consultant notes from yesterday/today were reviewed  [x]  All current labs were reviewed and interpreted for clinical significance   [x]  Appropriate follow-up labs were ordered  []  Collateral history obtained from:       Time spent with patient including counseling, chart review and nursing communication: 38 minutes    Nilsa JONETTA Blanch, MD    "

## 2024-07-05 NOTE — Plan of Care (Signed)
"    Problem: Chronic Conditions and Co-morbidities  Goal: Patient's chronic conditions and co-morbidity symptoms are monitored and maintained or improved  07/05/2024 1402 by Debby Roselie LABOR, RN  Outcome: Progressing  07/05/2024 1402 by Debby Roselie LABOR, RN  Outcome: Progressing     Problem: Discharge Planning  Goal: Discharge to home or other facility with appropriate resources  07/05/2024 1402 by Debby Roselie LABOR, RN  Outcome: Progressing  07/05/2024 1402 by Debby Roselie LABOR, RN  Outcome: Progressing     Problem: ABCDS Injury Assessment  Goal: Absence of physical injury  07/05/2024 1402 by Debby Roselie LABOR, RN  Outcome: Progressing  07/05/2024 1402 by Debby Roselie LABOR, RN  Outcome: Progressing     Problem: Safety - Adult  Goal: Free from fall injury  07/05/2024 1402 by Debby Roselie LABOR, RN  Outcome: Progressing  07/05/2024 1402 by Debby Roselie LABOR, RN  Outcome: Progressing     "

## 2024-07-05 NOTE — Progress Notes (Signed)
"  Piperacillin -tazobactam (Zosyn ) Extended-Infusion Dosing/Monitoring  Current regimen: 3.375 g IV every 8 hours    Recent Labs     07/02/24  1955 07/04/24  1215 07/05/24  0500   CREATININE 0.63 0.89 0.80   BUN 18 21 20      Estimated CrCl: 86 mL/min    Plan: Change to 4.5 g IV over 240 minutes every 8 hours per [facility] P&T Committee Protocol with respect to extended-infusion ?-lactam antibiotics. Pharmacy will continue to monitor patient daily and will make dosage adjustments based upon changing renal function.       "

## 2024-07-05 NOTE — Consults (Signed)
 "  IMPRESSION:   Acute hypoxic respiratory failure  Chronic hypoxic respiratory failure  Hypercapnic respiratory failure  Exacerbation of COPD  Right lower lobe pneumonia  Congestive heart failure  Chronic A-fib  Cirrhosis of liver transaminitis  Thrombocytopenia  Type 2 diabetes mellitus  Additional workup outlined below  Pt is at high risk of sudden decline and decompensation with life threatening consequenses and continued end organ dysfunction and failure  Pt is critically ill. Time spent with pt and staff actively rendering care, managing pt and coordinating care as stated below; 55 minutes, exclusive of any procedures      RECOMMENDATIONS/PLAN:   HPI  63 year old obese lady with she was recently seen by me and supposed to follow-up with my office recently her noninvasive ventilator setting has been changed from my office apparently she lives in a hotel and she has been moving every 21 days in the last hotel has some mold infestation and she was having upper respiratory symptoms she uses nebulizer machine at home she had a recent fall complaining of her right shoulder pain, she is chronically on home oxygen she did not have any sleep study done in the past she has history of hypertension chronic A-fib history of EtOH cirrhosis of liver, patient also has COPD she was severely hypoxic and on her last admission she was put on noninvasive ventilator CAT scan of the chest was done which showed bilateral pneumonia patient was intubated and successfully weaned off and she is using BiPAP at night and oxygen during the daytime this admission she was afebrile but she was hypoxic saturation 86% on room air hyponatremic BNP was 607 chest x-ray was done which showed right lower lobe infiltrate and also congestive changes mild edema x-ray of the shoulder was done which was negative for fracture.  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 CT head was negative  On 2 L nasal Cannula oxygen as  salvage oxygen delivery device to provide high concentration of oxygen to overcome refractory hypoxia; arterial blood gases on this admission did not show any CO2 retention pCO2 44 pO2 91  Patient is on Zosyn  nebulizer treatment also on IV Solu-Medrol  and Symbicort  inhaler  She is complaining about right shoulder pain she is on Tylenol  we will give Toradol  1 dose  Discussed with her to bring her home noninvasive ventilator which she has been using it but she is having difficulty with the mask which I have ordered from my office to get a nasal pillow and DME company is working on it.  Sodium corrected potassium 3.4 patient is on Bumex .  Transfuse prn to maintain Hgb > 7  Labs to follow electrolytes, renal function and and blood counts  Bronchial hygiene with respiratory therapy techniques, bronchodilators  Pt needs IV fluids with additives and Drug therapy requiring intensive monitoring for toxicity  Prescription drug management with home med reconciliation reviewed  DVT, SUP prophylaxis     [x]  High complexity decision making was performed  [x]  See my orders for details    PMH:  has a past medical history of Anxiety, Depression, Gastrointestinal disorder, Heart failure (HCC), Hyperlipidemia, Hypertension, Liver disease, and Pneumonia.    PSH:   has a past surgical history that includes Cataract removal; egd transoral biopsy single/multiple (05/17/2009); Dilation and curettage of uterus (1999); Hysterectomy (2008); Cesarean section (1986 and 1993); Adenoidectomy (1972); Tonsillectomy (1972); back surgery; and Upper gastrointestinal endoscopy (N/A, 04/17/2024).     FHX: family history includes Cancer  in her mother; Heart Disease in her father.     SHX:  reports that she quit smoking about 16 years ago. Her smoking use included cigarettes. She has never used smokeless tobacco. She reports that she does not drink alcohol and does not use drugs.    ALL:   Allergies   Allergen Reactions    Latex Rash        MEDS:   [x]   Reviewed - As Below   []  Not reviewed    Current Facility-Administered Medications   Medication Dose Route Frequency Provider Last Rate Last Admin    ipratropium 0.5 mg-albuterol  2.5 mg (DUONEB ) nebulizer solution 1 Dose  1 Dose Inhalation Q4H RT Tobie Slate D, MD   1 Dose at 07/05/24 9272    methylPREDNISolone  sodium succ (SOLU-MEDROL ) injection 40 mg  40 mg IntraVENous Daily Patel, Neha D, MD   40 mg at 07/05/24 9176    piperacillin -tazobactam (ZOSYN ) 3,375 mg in sodium chloride  0.9 % 50 mL IVPB (addEASE)  3,375 mg IntraVENous Q8H Tobie Slate D, MD 12.5 mL/hr at 07/05/24 0837 3,375 mg at 07/05/24 9162    acetaminophen  (TYLENOL ) tablet 325 mg  325 mg Oral Q8H PRN Tobie Slate D, MD   325 mg at 07/04/24 2046    atorvastatin  (LIPITOR ) tablet 40 mg  40 mg Oral Nightly Patel, Neha D, MD   40 mg at 07/04/24 2019    chlorthalidone  (HYGROTON ) tablet 25 mg  25 mg Oral Daily Tobie Slate D, MD        budesonide -formoterol  (SYMBICORT ) 80-4.5 MCG/ACT inhaler 2 puff  2 puff Inhalation BID RT Tobie Slate D, MD   2 puff at 07/05/24 9272    guaiFENesin  (MUCINEX ) extended release tablet 600 mg  600 mg Oral BID Patel, Neha D, MD   600 mg at 07/05/24 0823    lactobacillus (CULTURELLE) capsule 1 capsule  1 capsule Oral Daily with breakfast Tobie Slate D, MD   1 capsule at 07/05/24 9176    melatonin tablet 5 mg  1 tablet Oral Nightly Tobie Slate D, MD   5 mg at 07/04/24 2019    metoprolol  tartrate (LOPRESSOR ) tablet 50 mg  50 mg Oral BID Patel, Neha D, MD   50 mg at 07/05/24 9176    OLANZapine  (ZYPREXA ) tablet 10 mg  10 mg Oral Nightly Tobie Slate D, MD   10 mg at 07/04/24 2019    pantoprazole  (PROTONIX ) tablet 40 mg  40 mg Oral Daily Patel, Neha D, MD   40 mg at 07/05/24 9176    sertraline  (ZOLOFT ) tablet 100 mg  100 mg Oral Daily Tobie Slate D, MD   100 mg at 07/05/24 9176    enoxaparin  (LOVENOX ) injection 120 mg  1 mg/kg SubCUTAneous BID Patel, Neha D, MD   120 mg at 07/05/24 9176    bumetanide  (BUMEX ) injection 1 mg  1 mg  IntraVENous BID Patel, Neha D, MD   1 mg at 07/05/24 9176    doxycycline  hyclate (VIBRAMYCIN ) capsule 100 mg  100 mg Oral 2 times per day Tobie Slate D, MD   100 mg at 07/05/24 9176    sodium chloride  flush 0.9 % injection 5-40 mL  5-40 mL IntraVENous 2 times per day Tobie Slate D, MD   10 mL at 07/05/24 0824    sodium chloride  flush 0.9 % injection 5-40 mL  5-40 mL IntraVENous PRN Tobie Slate D, MD        0.9 % sodium chloride  infusion  IntraVENous PRN Tobie Nilsa BIRCH, MD        potassium chloride  (KLOR-CON  M) extended release tablet 40 mEq  40 mEq Oral PRN Tobie Nilsa D, MD        Or    potassium bicarb-citric acid  (EFFER-K ) effervescent tablet 40 mEq  40 mEq Oral PRN Tobie Nilsa D, MD        Or    potassium chloride  10 mEq/100 mL IVPB (Peripheral Line)  10 mEq IntraVENous PRN Tobie Nilsa D, MD        magnesium  sulfate 2000 mg in 50 mL IVPB premix  2,000 mg IntraVENous PRN Patel, Neha D, MD        polyethylene glycol (GLYCOLAX ) packet 17 g  17 g Oral Daily PRN Tobie Nilsa D, MD        acetaminophen  (TYLENOL ) tablet 650 mg  650 mg Oral Q6H PRN Tobie Nilsa D, MD        Or    acetaminophen  (TYLENOL ) suppository 650 mg  650 mg Rectal Q6H PRN Patel, Neha D, MD        insulin  lispro (HUMALOG ,ADMELOG ) injection vial 0-8 Units  0-8 Units SubCUTAneous 4x Daily AC & HS Tobie Nilsa D, MD   8 Units at 07/04/24 2126    glucose chewable tablet 16 g  4 tablet Oral PRN Tobie Nilsa D, MD        dextrose  bolus 10% 125 mL  125 mL IntraVENous PRN Tobie Nilsa D, MD        Or    dextrose  bolus 10% 250 mL  250 mL IntraVENous PRN Tobie Nilsa D, MD        glucagon  injection 1 mg  1 mg SubCUTAneous PRN Tobie Nilsa D, MD        dextrose  10 % infusion   IntraVENous Continuous PRN Tobie Nilsa BIRCH, MD            [facility] reviewed and pertinent medications noted or modified as needed      MND:Ezmupwzwu items are noted in HPI.      Hemodynamics:    CO:    CI:    CVP:    SVR:   PAP Systolic:    PAP Diastolic:    PVR:    SV02:        Ventilator Settings:       Mode Rate TV Press PEEP FiO2 PIP Min. Vent                              Vital Signs: Telemetry:    AFIB, SVT Intake/Output:   BP 128/69   Pulse 64   Temp 97.5 F (36.4 C) (Oral)   Resp 16   Ht 1.575 m (5' 2)   Wt 114 kg (251 lb 5.2 oz)   SpO2 96%   BMI 45.97 kg/m     Temp (24hrs), Avg:97.8 F (36.6 C), Min:97.5 F (36.4 C), Max:98.1 F (36.7 C)        O2 Device: Nasal cannula O2 Flow Rate (L/min): 3 L/min       Wt Readings from Last 4 Encounters:   07/05/24 114 kg (251 lb 5.2 oz)   06/16/24 116.5 kg (256 lb 13.4 oz)   06/06/24 126.6 kg (279 lb 1.6 oz)   05/03/24 129.7 kg (286 lb)          Intake/Output Summary (Last 24 hours) at 07/05/2024 9096  Last data filed  at 07/05/2024 0300  Gross per 24 hour   Intake --   Output 2200 ml   Net -2200 ml       Last shift:      No intake/output data recorded.  Last 3 shifts: 11/13 1901 - 11/15 0700  In: -   Out: 2200 [Urine:2200]       Physical Exam:     General: Alert awake on oxygen 3 L nasal cannula  HEENT: NCAT, poor dentition, lips and mucosa dry  Eyes: anicteric; conjunctiva clear  Neck: no nodes, trach midline; no accessory MM use.  Chest: no deformity,   Cardiac: IR regular; no murmur;   Lungs: distant breath sounds; diminished breath sound bilaterally few expiratory wheezes  Abd: soft, NT, hypoactive BS  Ext: no edema; no joint swelling; No clubbing  GU: NO foley, clear urine  Neuro: No focal neurological deficit  Psych- no agitation, oriented to person;   Skin: warm, dry, no cyanosis;   Pulses: 1-2+ Bilateral pedal, radial  Capillary: brisk; pale      DATA:  No results found for this or any previous visit.    05/02/24    ECHO (TTE) COMPLETE (PRN CONTRAST/BUBBLE/STRAIN/3D) 05/06/2024 10:52 AM (Final)    Interpretation Summary    Left Ventricle: Normal left ventricular systolic function with a visually estimated EF of 70 - 75%. Left ventricle size is normal. Normal wall thickness. Normal wall motion. Normal diastolic function.    Right Ventricle: Right  ventricle is moderately dilated. Normal systolic function.    Right Atrium: Right atrium is mildly dilated.    Pericardium: Moderate (1-2 cm) localized pericardial effusion present around the left ventricle. Pericardial posterior effusion measures 1.0 cm.    Image quality is adequate. Contrast used: Lumason . Technically difficult study, technically difficult study with poor endocardial visualization, technically difficult study due to patient's body habitus and technically difficult study due to patient's heart rhythm.    Signed by: Laquetta Commander, MD on 05/06/2024 10:52 AM       MAR reviewed and pertinent medications noted or modified as needed    MEDS:   Current Facility-Administered Medications   Medication Dose Route Frequency Provider Last Rate Last Admin    ipratropium 0.5 mg-albuterol  2.5 mg (DUONEB ) nebulizer solution 1 Dose  1 Dose Inhalation Q4H RT Tobie Slate D, MD   1 Dose at 07/05/24 0727    methylPREDNISolone  sodium succ (SOLU-MEDROL ) injection 40 mg  40 mg IntraVENous Daily Patel, Neha D, MD   40 mg at 07/05/24 9176    piperacillin -tazobactam (ZOSYN ) 3,375 mg in sodium chloride  0.9 % 50 mL IVPB (addEASE)  3,375 mg IntraVENous Q8H Tobie Slate D, MD 12.5 mL/hr at 07/05/24 0837 3,375 mg at 07/05/24 0837    acetaminophen  (TYLENOL ) tablet 325 mg  325 mg Oral Q8H PRN Patel, Neha D, MD   325 mg at 07/04/24 2046    atorvastatin  (LIPITOR ) tablet 40 mg  40 mg Oral Nightly Patel, Neha D, MD   40 mg at 07/04/24 2019    chlorthalidone  (HYGROTON ) tablet 25 mg  25 mg Oral Daily Tobie Slate D, MD        budesonide -formoterol  (SYMBICORT ) 80-4.5 MCG/ACT inhaler 2 puff  2 puff Inhalation BID RT Tobie Slate D, MD   2 puff at 07/05/24 9272    guaiFENesin  (MUCINEX ) extended release tablet 600 mg  600 mg Oral BID Patel, Neha D, MD   600 mg at 07/05/24 9176    lactobacillus (CULTURELLE) capsule 1 capsule  1 capsule Oral Daily with breakfast Tobie Slate D, MD   1 capsule at 07/05/24 9176    melatonin tablet 5 mg  1 tablet  Oral Nightly Tobie Slate D, MD   5 mg at 07/04/24 2019    metoprolol  tartrate (LOPRESSOR ) tablet 50 mg  50 mg Oral BID Patel, Neha D, MD   50 mg at 07/05/24 9176    OLANZapine  (ZYPREXA ) tablet 10 mg  10 mg Oral Nightly Patel, Neha D, MD   10 mg at 07/04/24 2019    pantoprazole  (PROTONIX ) tablet 40 mg  40 mg Oral Daily Patel, Neha D, MD   40 mg at 07/05/24 9176    sertraline  (ZOLOFT ) tablet 100 mg  100 mg Oral Daily Tobie Slate D, MD   100 mg at 07/05/24 9176    enoxaparin  (LOVENOX ) injection 120 mg  1 mg/kg SubCUTAneous BID Patel, Neha D, MD   120 mg at 07/05/24 9176    bumetanide  (BUMEX ) injection 1 mg  1 mg IntraVENous BID Patel, Neha D, MD   1 mg at 07/05/24 0823    doxycycline  hyclate (VIBRAMYCIN ) capsule 100 mg  100 mg Oral 2 times per day Tobie Slate D, MD   100 mg at 07/05/24 9176    sodium chloride  flush 0.9 % injection 5-40 mL  5-40 mL IntraVENous 2 times per day Tobie Slate D, MD   10 mL at 07/05/24 9175    sodium chloride  flush 0.9 % injection 5-40 mL  5-40 mL IntraVENous PRN Tobie Slate D, MD        0.9 % sodium chloride  infusion   IntraVENous PRN Tobie Slate BIRCH, MD        potassium chloride  (KLOR-CON  M) extended release tablet 40 mEq  40 mEq Oral PRN Tobie Slate D, MD        Or    potassium bicarb-citric acid  (EFFER-K ) effervescent tablet 40 mEq  40 mEq Oral PRN Tobie Slate D, MD        Or    potassium chloride  10 mEq/100 mL IVPB (Peripheral Line)  10 mEq IntraVENous PRN Tobie Slate D, MD        magnesium  sulfate 2000 mg in 50 mL IVPB premix  2,000 mg IntraVENous PRN Patel, Neha D, MD        polyethylene glycol (GLYCOLAX ) packet 17 g  17 g Oral Daily PRN Tobie Slate D, MD        acetaminophen  (TYLENOL ) tablet 650 mg  650 mg Oral Q6H PRN Tobie Slate D, MD        Or    acetaminophen  (TYLENOL ) suppository 650 mg  650 mg Rectal Q6H PRN Patel, Neha D, MD        insulin  lispro (HUMALOG ,ADMELOG ) injection vial 0-8 Units  0-8 Units SubCUTAneous 4x Daily AC & HS Tobie Slate D, MD   8 Units at 07/04/24 2126     glucose chewable tablet 16 g  4 tablet Oral PRN Tobie Slate D, MD        dextrose  bolus 10% 125 mL  125 mL IntraVENous PRN Tobie Slate D, MD        Or    dextrose  bolus 10% 250 mL  250 mL IntraVENous PRN Tobie Slate D, MD        glucagon  injection 1 mg  1 mg SubCUTAneous PRN Tobie Slate D, MD        dextrose  10 % infusion   IntraVENous Continuous PRN Tobie Slate BIRCH, MD  ARTERIAL BLOOD GAS  Recent Labs     07/04/24  1747   PHART 7.44   PCO2ART 44   PO2ART 91   HCO3ART 29*   BEART 4.2*   FIO2A 36.0   O2SATART 97   OXYHEM 95.8   CARBOXHGBART 0.8*   METHGBART 0.3   TEMP 98.0     Labs:    Recent Labs     07/02/24  1955 07/04/24  1214 07/04/24  1215 07/05/24  0500   WBC 6.8  --  7.8 7.6   HGB 10.0*  --  9.5* 8.9*   PLT 114*  --  149* 130*   DDIMER  --  0.42  --   --      Recent Labs     07/02/24  1955 07/04/24  1215 07/05/24  0500   NA 143 146* 145   K 3.7 3.8 3.4*   CL 106 107 102   CO2 30* 31* 33*   GLUCOSE 126* 156* 204*   BUN 18 21 20    CREATININE 0.63 0.89 0.80   CALCIUM  8.9 8.5* 8.3*   BILITOT 0.8 0.7  --    AST 44* 38*  --    ALT 19 21  --      No results for input(s): CKTOTAL, CKMB, CKMBINDEX, TROPONINI in the last 72 hours.  Lab Results   Component Value Date/Time    PROBNP 607 07/04/2024 12:15 PM      Lab Results   Component Value Date/Time    TSH 2.400 06/09/2024 01:50 PM      Results       Procedure Component Value Units Date/Time    MRSA by PCR [[id number]] Collected: 07/04/24 1830    Order Status: Completed Specimen: Swab Updated: 07/04/24 2033     MRSA by PCR Not Detected       Culture, Respiratory [[id number]]     Order Status: Sent Specimen: Sputum Expectorated     Culture, Blood 1 [[id number]] Collected: 07/04/24 1215    Order Status: Completed Specimen: Blood Updated: 07/05/24 0807     Special Requests --        No Special Requests  Left  Antecubital       Culture No growth after 18 hours       Culture, Blood 2 [[id number]] Collected: 07/04/24 1215    Order Status: Completed Specimen:  Blood Updated: 07/05/24 0807     Special Requests No Special Requests        Culture No growth after 18 hours       COVID-19 & Influenza Combo [[id number]] Collected: 07/04/24 1215    Order Status: Completed Specimen: Nasopharynx Updated: 07/04/24 1328     SARS-CoV-2, PCR Not Detected        Comment: Not Detected results do not preclude SARS-CoV-2 infection and should not be used as the sole basis for patient management decisions. Results must be combined with clinical observations, patient history, and epidemiological information        Rapid Influenza A By PCR Not Detected        Rapid Influenza B By PCR Not Detected        Comment:    Testing was performed using cobas Liat SARS-CoV-2 and Influenza A/B nucleic acid assay.  This test is a multiplex Real-Time Reverse Transcriptas                Imaging:  XR SHOULDER RIGHT (MIN 2 VIEWS)   Final Result   No  acute abnormality.      Electronically signed by REDELL CUNNING      XR CHEST 1 VIEW   Final Result   1. Persistent right lower lobe infiltrate consistent with pneumonia follow-up to   resolution is suggested.   2. There is pulmonary vascular congestion and possible mild interstitial edema..      Electronically signed by Evalene JINNY Hoit          XR SHOULDER RIGHT (MIN 2 VIEWS)  Result Date: 07/04/2024  EXAM: XR SHOULDER RIGHT (MIN 2 VIEWS) INDICATION: fall. FINDINGS: Three views of the right shoulder demonstrate no fracture, dislocation or other acute abnormality.     No acute abnormality. Electronically signed by REDELL CUNNING    XR CHEST 1 VIEW  Result Date: 07/04/2024  EXAM: XR CHEST 1 VIEW INDICATION: Fever/Suspected Infection/Sepsis COMPARISON: CT scan 06/04/2024 and chest radiograph 07/02/2024 and 06/10/2024 FINDINGS: A portable AP radiograph of the chest was obtained at 1227 hours. The patient is on a cardiac monitor. There is moderate to severe cardiomegaly. Lungs are fairly well aerated. There is persistent airspace opacities laterally in the right lower  lobe that have decreased in density but apparently increased in size consistent with persistent pneumonia in the right lower lobe. Follow-up to resolution is suggested. There is pulmonary vascular ingestion and possibly mild interstitial edema.     1. Persistent right lower lobe infiltrate consistent with pneumonia follow-up to resolution is suggested. 2. There is pulmonary vascular congestion and possible mild interstitial edema.. Electronically signed by Evalene JINNY Hoit       This care involved high complexity decision making which includes independently reviewing the patient's past medical records, current laboratory results, medication profiles that were immediately available to me and actual Xray images at the bedside in order to assess, support vital system function, and to treat this degree of vital organ system failure, and to prevent further life threatening deterioration of the patients condition.     Medical Decision Making Today  Reviewed the flowsheet and previous days notes  Reviewed and summarized records or history from previous days note or discussions with staff, family  Parenteral controlled substances - Reviewed/ Adjusted / Weaned / Started  High Risk Drug therapy requiring intensive monitoring for toxicity: eg steroids, pressors, antibiotics  Review and order of Clinical lab tests  Review and Order of Radiology tests  Review and Order of Medicine tests  Independent visualization of radiologic Images  Reviewed Ventilator / NiPPV  I have personally reviewed the patients ECG / Telemetry  Diagnostic endoscopies with identified risk factors    I have provided total of 55 minutes of critical care time rendering care exclusive of any procedures. During this entire length of time the patient's condition was unstable, unpredictable I was immediately available to the patient whose care required several interactions with nursing, multidisciplinary team members leading to multiple interventions with fluid  resuscitation and medication adjustments to optimize respiratory support, hemodynamic treatment, medication changes based on repeat labs results, reviews, exams and assessments. The reason for providing this level of medical care was due to a critical illness that impaired one or more vital organ systems, such that there was a high probability of sudden or life threatening deterioration in the patient's condition.   "

## 2024-07-05 NOTE — Progress Notes (Signed)
"  Spiritual Health Progress Note  [place]      Room # 575/01    Name: Breanna Lester           Age: 63 y.o.    Gender: female          MRN: [id number]  Religion: Baptist       Preferred Language: English      Date: 07/05/24  Visit Time: Begin Time: 1501 End Time : 1531  Complexity of Encounter: Low      Visit Summary: Chaplain is visiting for a spiritual consult with the patient in 575. Breanna Lester shared about her struggles and setbacks in life. She notes some of her complicated family dynamics. She notes she maintains a positive spirit because of her faith in God and how she has experienced Jesus early in her life. God has helped carry her through hardship and challenges. She would like to receive follow up tele-chaplain support. Chaplain provided an active listening presence, spiritual assessment, and prayer.     Referral/Consult From: Nurse  Encounter Overview/Reason: Loneliness/Social Isolation  Encounter Code: Encounter Code: [id number] Assessment by chaplain services   Crisis (if applicable):    Service Provided For: Patient     Patient was available.    Faith, Belief, Meaning:   Patient identifies as spiritual  is connected with a faith tradition or spiritual practice  has beliefs or practices that help with coping during difficult times  faith/ spirituality is a source of strength  Family/Friends No family/friends present  Rituals (if applicable)      Importance and Influence:  Patient has spiritual/personal beliefs that influence decisions regarding their health  Family/Friends No family/friends present    Community:  Patient   is not connected with a spiritual community  indicated that they do not feel well-supported  Family/Friends   No family/ friends present.    Assessment and Plan of Care:   Emotions Expressed by Patient:   Assessment: Calm, Hopeful, Coping    Interventions by Chaplain:   Intervention: Active listening, Explored Coping Skills/Resources, Discussed relationship with God, Discussed belief  system/religious practices/faith, Prayer (assurance of)/Blessing, Explored/Affirmed feelings, thoughts, concerns, Discussed illness injury and its impact, Sustaining Presence/Ministry of presence, Discussed meaning/purpose, Life review/Legacy     Result/ Response by Patient:   Outcome: Expressed feelings, needs, and concerns, Engaged in conversation, Coping, Receptive    Patient Plan of Care:   Plan and Referrals  Plan/Referrals: Other (Comment) (Chaplain follow up is available by referral)     Electronically signed by   Elia Chyrl Penner, M.Div, M.S, P[facility] on 07/05/2024 at 3:34 PM.   Spiritual Health  [facility]/ [facility] can be reached by calling the operator at [facility]   [phone]    "

## 2024-07-05 NOTE — Plan of Care (Signed)
"    Problem: Chronic Conditions and Co-morbidities  Goal: Patient's chronic conditions and co-morbidity symptoms are monitored and maintained or improved  07/05/2024 1402 by Debby Roselie LABOR, RN  Outcome: Progressing     Problem: Discharge Planning  Goal: Discharge to home or other facility with appropriate resources  07/05/2024 2209 by Cleatus Marty Macintosh, RN  Outcome: Progressing  07/05/2024 1402 by Debby Roselie LABOR, RN  Outcome: Progressing     Problem: ABCDS Injury Assessment  Goal: Absence of physical injury  07/05/2024 1402 by Debby Roselie LABOR, RN  Outcome: Progressing     Problem: Safety - Adult  Goal: Free from fall injury  07/05/2024 2209 by Cleatus Marty Macintosh, RN  Outcome: Progressing  07/05/2024 1402 by Debby Roselie LABOR, RN  Outcome: Progressing     Problem: Skin/Tissue Integrity  Goal: Skin integrity remains intact  Description: 1.  Monitor for areas of redness and/or skin breakdown  2.  Assess vascular access sites hourly  3.  Every 4-6 hours minimum:  Change oxygen saturation probe site  4.  Every 4-6 hours:  If on nasal continuous positive airway pressure, respiratory therapy assess nares and determine need for appliance change or resting period  07/05/2024 2209 by Cleatus Marty Macintosh, RN  Outcome: Progressing  07/05/2024 1402 by Debby Roselie LABOR, RN  Outcome: Progressing     Problem: Pain  Goal: Verbalizes/displays adequate comfort level or baseline comfort level  07/05/2024 1402 by Debby Roselie LABOR, RN  Outcome: Progressing     "

## 2024-07-06 LAB — CBC WITH AUTO DIFFERENTIAL
Basophils %: 0.3 % (ref 0.0–1.0)
Basophils Absolute: 0.03 K/UL (ref 0.00–0.10)
Eosinophils %: 0.7 % (ref 0.0–7.0)
Eosinophils Absolute: 0.06 K/UL (ref 0.00–0.40)
Hematocrit: 34.1 % — ABNORMAL LOW (ref 35.0–47.0)
Hemoglobin: 9.8 g/dL — ABNORMAL LOW (ref 11.5–16.0)
Immature Granulocytes %: 0.2 % (ref 0–0.5)
Immature Granulocytes Absolute: 0.02 K/UL (ref 0.00–0.04)
Lymphocytes %: 32.9 % (ref 12.0–49.0)
Lymphocytes Absolute: 2.93 K/UL (ref 0.80–3.50)
MCH: 25.3 pg — ABNORMAL LOW (ref 26.0–34.0)
MCHC: 28.7 g/dL — ABNORMAL LOW (ref 30.0–36.5)
MCV: 87.9 FL (ref 80.0–99.0)
MPV: 12 FL (ref 8.9–12.9)
Monocytes %: 7.7 % (ref 5.0–13.0)
Monocytes Absolute: 0.69 K/UL (ref 0.00–1.00)
Neutrophils %: 58.2 % (ref 32.0–75.0)
Neutrophils Absolute: 5.17 K/UL (ref 1.80–8.00)
Nucleated RBCs: 0 /100{WBCs}
Platelets: 137 K/uL — ABNORMAL LOW (ref 150–400)
RBC: 3.88 M/uL (ref 3.80–5.20)
RDW: 17.6 % — ABNORMAL HIGH (ref 11.5–14.5)
WBC: 8.9 K/uL (ref 3.6–11.0)
nRBC: 0 K/uL (ref 0.00–0.01)

## 2024-07-06 LAB — POCT GLUCOSE
POC Glucose: 258 mg/dL — ABNORMAL HIGH (ref 65–100)
POC Glucose: 124 mg/dL — ABNORMAL HIGH (ref 65–100)
POC Glucose: 193 mg/dL — ABNORMAL HIGH (ref 65–100)

## 2024-07-06 LAB — BASIC METABOLIC PANEL
Anion Gap: 10 mmol/L (ref 2–14)
BUN/Creatinine Ratio: 23 — ABNORMAL HIGH (ref 12–20)
BUN: 21 mg/dL (ref 8–23)
CO2: 36 mmol/L — ABNORMAL HIGH (ref 20–29)
Calcium: 8.3 mg/dL — ABNORMAL LOW (ref 8.8–10.2)
Chloride: 99 mmol/L (ref 98–107)
Creatinine: 0.91 mg/dL (ref 0.60–1.00)
Est, Glom Filt Rate: 71 ml/min/1.73m2 (ref >59)
Glucose: 125 mg/dL — ABNORMAL HIGH (ref 65–100)
Potassium: 3 mmol/L — ABNORMAL LOW (ref 3.5–5.1)
Sodium: 144 mmol/L (ref 136–145)

## 2024-07-06 LAB — BRAIN NATRIURETIC PEPTIDE: NT Pro-BNP: 483 pg/mL — ABNORMAL HIGH (ref 0–125)

## 2024-07-06 LAB — POTASSIUM: Potassium: 4 mmol/L (ref 3.5–5.1)

## 2024-07-06 LAB — MAGNESIUM: Magnesium: 1.8 mg/dL (ref 1.6–2.4)

## 2024-07-06 MED ORDER — AMOXICILLIN-POT CLAVULANATE 875-125 MG PO TABS
875-125 | ORAL_TABLET | Freq: Two times a day (BID) | ORAL | 0 refills | 7.00000 days | Status: AC
Start: 2024-07-06 — End: 2024-07-13

## 2024-07-06 MED ORDER — DOXYCYCLINE HYCLATE 100 MG PO CAPS
100 | ORAL_CAPSULE | Freq: Two times a day (BID) | ORAL | 0 refills | 7.00000 days | Status: AC
Start: 2024-07-06 — End: 2024-07-11

## 2024-07-06 MED ORDER — PREDNISONE 20 MG PO TABS
20 | ORAL_TABLET | Freq: Two times a day (BID) | ORAL | 0 refills | 7.00000 days | Status: AC
Start: 2024-07-06 — End: 2024-07-11

## 2024-07-06 MED ORDER — BUMETANIDE 0.25 MG/ML IJ SOLN
0.25 | Freq: Every day | INTRAMUSCULAR | Status: DC
Start: 2024-07-06 — End: 2024-07-06

## 2024-07-06 MED FILL — EFFER-K 20 MEQ PO TBEF: 20 meq | ORAL | Qty: 2 | Fill #0

## 2024-07-06 MED FILL — PANTOPRAZOLE SODIUM 40 MG PO TBEC: 40 mg | ORAL | Qty: 1 | Fill #0

## 2024-07-06 MED FILL — BUDESONIDE-FORMOTEROL FUMARATE 80-4.5 MCG/ACT IN AERO: 80-4.5 MCG/ACT | RESPIRATORY_TRACT | Qty: 0.12 | Fill #0

## 2024-07-06 MED FILL — METHYLPREDNISOLONE SODIUM SUCC 40 MG IJ SOLR: 40 mg | INTRAMUSCULAR | Qty: 40 | Fill #0

## 2024-07-06 MED FILL — PIPERACILLIN SOD-TAZOBACTAM SO 4.5 (4-0.5) G IV SOLR: 4.5 (4-0.5) g | INTRAVENOUS | Qty: 4500 | Fill #0

## 2024-07-06 MED FILL — BUDESONIDE-FORMOTEROL FUMARATE 80-4.5 MCG/ACT IN AERO: 80-4.5 MCG/ACT | RESPIRATORY_TRACT | Qty: 0.23 | Fill #0

## 2024-07-06 MED FILL — IPRATROPIUM-ALBUTEROL 0.5-2.5 (3) MG/3ML IN SOLN: 0.5-2.5 (3) MG/3ML | RESPIRATORY_TRACT | Qty: 3 | Fill #0

## 2024-07-06 MED FILL — METOPROLOL TARTRATE 50 MG PO TABS: 50 mg | ORAL | Qty: 1 | Fill #0

## 2024-07-06 MED FILL — POTASSIUM CHLORIDE CRYS ER 20 MEQ PO TBCR: 20 meq | ORAL | Qty: 2 | Fill #0

## 2024-07-06 MED FILL — ENOXAPARIN SODIUM 120 MG/0.8ML IJ SOSY: 120 MG/0.8ML | INTRAMUSCULAR | Qty: 0.8 | Fill #0

## 2024-07-06 MED FILL — ATORVASTATIN CALCIUM 40 MG PO TABS: 40 mg | ORAL | Qty: 1 | Fill #0

## 2024-07-06 MED FILL — MELATONIN 5 MG PO TABS: 5 mg | ORAL | Qty: 1 | Fill #0

## 2024-07-06 MED FILL — OLANZAPINE 10 MG PO TABS: 10 mg | ORAL | Qty: 1 | Fill #0

## 2024-07-06 MED FILL — DOXYCYCLINE HYCLATE 100 MG PO CAPS: 100 mg | ORAL | Qty: 1 | Fill #0

## 2024-07-06 MED FILL — SERTRALINE HCL 50 MG PO TABS: 50 mg | ORAL | Qty: 2 | Fill #0

## 2024-07-06 MED FILL — MUCUS RELIEF ER 600 MG PO TB12: 600 mg | ORAL | Qty: 1 | Fill #0

## 2024-07-06 MED FILL — INSULIN LISPRO 100 UNIT/ML IJ SOLN: 100 [IU]/mL | INTRAMUSCULAR | Qty: 2 | Fill #0

## 2024-07-06 MED FILL — TYLENOL 325 MG PO TABS: 325 mg | ORAL | Qty: 2 | Fill #0

## 2024-07-06 MED FILL — INSULIN LISPRO 100 UNIT/ML IJ SOLN: 100 [IU]/mL | INTRAMUSCULAR | Qty: 4 | Fill #0

## 2024-07-06 MED FILL — BUMETANIDE 0.25 MG/ML IJ SOLN: 0.25 mg/mL | INTRAMUSCULAR | Qty: 4 | Fill #0

## 2024-07-06 MED FILL — CULTURELLE PO CAPS: ORAL | Qty: 1 | Fill #0

## 2024-07-06 NOTE — Plan of Care (Signed)
"    Problem: Chronic Conditions and Co-morbidities  Goal: Patient's chronic conditions and co-morbidity symptoms are monitored and maintained or improved  Outcome: Progressing  Flowsheets (Taken 07/06/2024 1056)  Care Plan - Patient's Chronic Conditions and Co-Morbidity Symptoms are Monitored and Maintained or Improved: Monitor and assess patient's chronic conditions and comorbid symptoms for stability, deterioration, or improvement     Problem: Discharge Planning  Goal: Discharge to home or other facility with appropriate resources  07/06/2024 1127 by Exie Darice Mini, RN  Outcome: Progressing  Flowsheets (Taken 07/06/2024 1056)  Discharge to home or other facility with appropriate resources: Identify barriers to discharge with patient and caregiver  07/05/2024 2209 by Cleatus Marty Macintosh, RN  Outcome: Progressing     Problem: ABCDS Injury Assessment  Goal: Absence of physical injury  Outcome: Progressing  Flowsheets (Taken 07/06/2024 1101)  Absence of Physical Injury: Implement safety measures based on patient assessment     Problem: Safety - Adult  Goal: Free from fall injury  07/06/2024 1127 by Exie Darice Mini, RN  Outcome: Progressing  07/05/2024 2209 by Cleatus Marty Macintosh, RN  Outcome: Progressing     Problem: Skin/Tissue Integrity  Goal: Skin integrity remains intact  Description: 1.  Monitor for areas of redness and/or skin breakdown  2.  Assess vascular access sites hourly  3.  Every 4-6 hours minimum:  Change oxygen saturation probe site  4.  Every 4-6 hours:  If on nasal continuous positive airway pressure, respiratory therapy assess nares and determine need for appliance change or resting period  07/06/2024 1127 by Exie Darice Mini, RN  Outcome: Progressing  Flowsheets  Taken 07/06/2024 1101  Skin Integrity Remains Intact: Monitor for areas of redness and/or skin breakdown  Taken 07/06/2024 1056  Skin Integrity Remains Intact: Monitor for areas of redness and/or skin breakdown  07/05/2024 2209  by Cleatus Marty Macintosh, RN  Outcome: Progressing     Problem: Pain  Goal: Verbalizes/displays adequate comfort level or baseline comfort level  Outcome: Progressing     "

## 2024-07-06 NOTE — Progress Notes (Signed)
 "prog  IMPRESSION:   Acute hypoxic respiratory failure  Chronic hypoxic respiratory failure  Hypercapnic respiratory failure  Exacerbation of COPD much improved  Right lower lobe pneumonia  Congestive heart failure  Chronic A-fib  Cirrhosis of liver transaminitis  Thrombocytopenia  Type 2 diabetes mellitus  Additional workup outlined below  Pt is at high risk of sudden decline and decompensation with life threatening consequenses and continued end organ dysfunction and failure  Pt is critically ill. Time spent with pt and staff actively rendering care, managing pt and coordinating care as stated below; 30 minutes, exclusive of any procedures      RECOMMENDATIONS/PLAN:   HPI  63 year old obese lady with she was recently seen by me and supposed to follow-up with my office recently her noninvasive ventilator setting has been changed from my office apparently she lives in a hotel and she has been moving every 21 days in the last hotel has some mold infestation and she was having upper respiratory symptoms she uses nebulizer machine at home she had a recent fall complaining of her right shoulder pain, she is chronically on home oxygen she did not have any sleep study done in the past she has history of hypertension chronic A-fib history of EtOH cirrhosis of liver, patient also has COPD she was severely hypoxic and on her last admission she was put on noninvasive ventilator CAT scan of the chest was done which showed bilateral pneumonia patient was intubated and successfully weaned off and she is using BiPAP at night and oxygen during the daytime this admission she was afebrile but she was hypoxic saturation 86% on room air hyponatremic BNP was 607 chest x-ray was done which showed right lower lobe infiltrate and also congestive changes mild edema x-ray of the shoulder was done which was negative for fracture.  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 CT head was negative  On 2 L nasal  Cannula oxygen as salvage oxygen delivery device to provide high concentration of oxygen to overcome refractory hypoxia; arterial blood gases on this admission did not show any CO2 retention pCO2 44 pO2 91  Patient is on Zosyn  nebulizer treatment also on IV Solu-Medrol  and Symbicort  inhaler  She is complaining about right shoulder pain she is on Tylenol  we will give Toradol  1 dose  Discussed with her to bring her home noninvasive ventilator which she has been using it but she is having difficulty with the mask which I have ordered from my office to get a nasal pillow and DME company is working on it.  Sodium corrected potassium 3.4 patient is on Bumex .  Transfuse prn to maintain Hgb > 7  Labs to follow electrolytes, renal function and and blood counts  Bronchial hygiene with respiratory therapy techniques, bronchodilators  Pt needs IV fluids with additives and Drug therapy requiring intensive monitoring for toxicity  Prescription drug management with home med reconciliation reviewed  DVT, SUP prophylaxis     11/16  Alert awake eating breakfast shortness with improved discussed with her to bring her noninvasive ventilator which her daughter can bring it from home continue with the nebulizer treatment IV Solu-Medrol  she is already on antibiotics resolving pneumonia right lower lobe    [x]  High complexity decision making was performed  [x]  See my orders for details    PMH:  has a past medical history of Anxiety, Depression, Gastrointestinal disorder, Heart failure (HCC), Hyperlipidemia, Hypertension, Liver disease, and Pneumonia.    PSH:   has  a past surgical history that includes Cataract removal; egd transoral biopsy single/multiple (05/17/2009); Dilation and curettage of uterus (1999); Hysterectomy (2008); Cesarean section (1986 and 1993); Adenoidectomy (1972); Tonsillectomy (1972); back surgery; and Upper gastrointestinal endoscopy (N/A, 04/17/2024).     FHX: family history includes Cancer in her mother; Heart  Disease in her father.     SHX:  reports that she quit smoking about 16 years ago. Her smoking use included cigarettes. She has never used smokeless tobacco. She reports that she does not drink alcohol and does not use drugs.    ALL:   Allergies   Allergen Reactions    Latex Rash        MEDS:   [x]  Reviewed - As Below   []  Not reviewed    Current Facility-Administered Medications   Medication Dose Route Frequency Provider Last Rate Last Admin    piperacillin -tazobactam (ZOSYN ) 4,500 mg in sodium chloride  0.9 % 100 mL IVPB (addEASE)  4,500 mg IntraVENous Q8H Tobie Slate D, MD 25 mL/hr at 07/06/24 0651 4,500 mg at 07/06/24 0651    ipratropium 0.5 mg-albuterol  2.5 mg (DUONEB ) nebulizer solution 1 Dose  1 Dose Inhalation Q4H RT Tobie Slate D, MD   1 Dose at 07/06/24 9282    methylPREDNISolone  sodium succ (SOLU-MEDROL ) injection 40 mg  40 mg IntraVENous Daily Patel, Neha D, MD   40 mg at 07/06/24 9147    acetaminophen  (TYLENOL ) tablet 325 mg  325 mg Oral Q8H PRN Tobie Slate D, MD   325 mg at 07/04/24 2046    atorvastatin  (LIPITOR ) tablet 40 mg  40 mg Oral Nightly Patel, Neha D, MD   40 mg at 07/05/24 2026    budesonide -formoterol  (SYMBICORT ) 80-4.5 MCG/ACT inhaler 2 puff  2 puff Inhalation BID RT Tobie Slate D, MD   2 puff at 07/06/24 9282    guaiFENesin  (MUCINEX ) extended release tablet 600 mg  600 mg Oral BID Patel, Neha D, MD   600 mg at 07/06/24 0852    lactobacillus (CULTURELLE) capsule 1 capsule  1 capsule Oral Daily with breakfast Tobie Slate D, MD   1 capsule at 07/06/24 0851    melatonin tablet 5 mg  1 tablet Oral Nightly Tobie Slate D, MD   5 mg at 07/05/24 2026    metoprolol  tartrate (LOPRESSOR ) tablet 50 mg  50 mg Oral BID Patel, Neha D, MD   50 mg at 07/06/24 9148    OLANZapine  (ZYPREXA ) tablet 10 mg  10 mg Oral Nightly Tobie Slate D, MD   10 mg at 07/05/24 2026    pantoprazole  (PROTONIX ) tablet 40 mg  40 mg Oral Daily Patel, Neha D, MD   40 mg at 07/06/24 9148    sertraline  (ZOLOFT ) tablet 100 mg  100 mg  Oral Daily Patel, Neha D, MD   100 mg at 07/06/24 9148    enoxaparin  (LOVENOX ) injection 120 mg  1 mg/kg SubCUTAneous BID Patel, Neha D, MD   120 mg at 07/06/24 9146    bumetanide  (BUMEX ) injection 1 mg  1 mg IntraVENous BID Patel, Neha D, MD   1 mg at 07/06/24 9147    doxycycline  hyclate (VIBRAMYCIN ) capsule 100 mg  100 mg Oral 2 times per day Tobie Slate D, MD   100 mg at 07/06/24 0851    sodium chloride  flush 0.9 % injection 5-40 mL  5-40 mL IntraVENous 2 times per day Tobie Slate D, MD   10 mL at 07/06/24 0852    sodium chloride  flush  0.9 % injection 5-40 mL  5-40 mL IntraVENous PRN Tobie Slate D, MD        0.9 % sodium chloride  infusion   IntraVENous PRN Tobie Slate D, MD        potassium chloride  (KLOR-CON  M) extended release tablet 40 mEq  40 mEq Oral PRN Tobie Slate D, MD        Or    potassium bicarb-citric acid  (EFFER-K ) effervescent tablet 40 mEq  40 mEq Oral PRN Tobie Slate D, MD   40 mEq at 07/05/24 1629    Or    potassium chloride  10 mEq/100 mL IVPB (Peripheral Line)  10 mEq IntraVENous PRN Tobie Slate D, MD        magnesium  sulfate 2000 mg in 50 mL IVPB premix  2,000 mg IntraVENous PRN Patel, Neha D, MD        polyethylene glycol (GLYCOLAX ) packet 17 g  17 g Oral Daily PRN Tobie Slate D, MD        acetaminophen  (TYLENOL ) tablet 650 mg  650 mg Oral Q6H PRN Patel, Neha D, MD   650 mg at 07/05/24 2320    Or    acetaminophen  (TYLENOL ) suppository 650 mg  650 mg Rectal Q6H PRN Patel, Neha D, MD        insulin  lispro (HUMALOG ,ADMELOG ) injection vial 0-8 Units  0-8 Units SubCUTAneous 4x Daily AC & HS Tobie Slate D, MD   4 Units at 07/05/24 2110    glucose chewable tablet 16 g  4 tablet Oral PRN Tobie Slate D, MD        dextrose  bolus 10% 125 mL  125 mL IntraVENous PRN Tobie Slate D, MD        Or    dextrose  bolus 10% 250 mL  250 mL IntraVENous PRN Tobie Slate D, MD        glucagon  injection 1 mg  1 mg SubCUTAneous PRN Tobie Slate D, MD        dextrose  10 % infusion   IntraVENous Continuous PRN Tobie Slate BIRCH,  MD            [facility] reviewed and pertinent medications noted or modified as needed      MND:Ezmupwzwu items are noted in HPI.      Hemodynamics:    CO:    CI:    CVP:    SVR:   PAP Systolic:    PAP Diastolic:    PVR:    SV02:        Ventilator Settings:      Mode Rate TV Press PEEP FiO2 PIP Min. Vent                              Vital Signs: Telemetry:    AFIB, SVT Intake/Output:   BP 115/70   Pulse 58   Temp 98.1 F (36.7 C) (Oral)   Resp 20   Ht 1.575 m (5' 2)   Wt 114 kg (251 lb 5.2 oz)   SpO2 93%   BMI 45.97 kg/m     Temp (24hrs), Avg:97.7 F (36.5 C), Min:97.3 F (36.3 C), Max:98.1 F (36.7 C)        O2 Device: Nasal cannula O2 Flow Rate (L/min): 3 L/min       Wt Readings from Last 4 Encounters:   07/06/24 114 kg (251 lb 5.2 oz)   06/16/24 116.5 kg (256 lb 13.4 oz)   06/06/24  126.6 kg (279 lb 1.6 oz)   05/03/24 129.7 kg (286 lb)          Intake/Output Summary (Last 24 hours) at 07/06/2024 0958  Last data filed at 07/05/2024 1747  Gross per 24 hour   Intake --   Output 800 ml   Net -800 ml       Last shift:      No intake/output data recorded.  Last 3 shifts: 11/14 1901 - 11/16 0700  In: -   Out: 3000 [Urine:3000]       Physical Exam:     General: Alert awake on oxygen 3 L nasal cannula  HEENT: NCAT, poor dentition, lips and mucosa dry  Eyes: anicteric; conjunctiva clear  Neck: no nodes, trach midline; no accessory MM use.  Chest: no deformity,   Cardiac: IR regular; no murmur;   Lungs: distant breath sounds; diminished breath sound bilaterally few expiratory wheezes  Abd: soft, NT, hypoactive BS  Ext: no edema; no joint swelling; No clubbing  GU: NO foley, clear urine  Neuro: No focal neurological deficit  Psych- no agitation, oriented to person;   Skin: warm, dry, no cyanosis;   Pulses: 1-2+ Bilateral pedal, radial  Capillary: brisk; pale      DATA:  No results found for this or any previous visit.    05/02/24    ECHO (TTE) COMPLETE (PRN CONTRAST/BUBBLE/STRAIN/3D) 05/06/2024 10:52 AM  (Final)    Interpretation Summary    Left Ventricle: Normal left ventricular systolic function with a visually estimated EF of 70 - 75%. Left ventricle size is normal. Normal wall thickness. Normal wall motion. Normal diastolic function.    Right Ventricle: Right ventricle is moderately dilated. Normal systolic function.    Right Atrium: Right atrium is mildly dilated.    Pericardium: Moderate (1-2 cm) localized pericardial effusion present around the left ventricle. Pericardial posterior effusion measures 1.0 cm.    Image quality is adequate. Contrast used: Lumason . Technically difficult study, technically difficult study with poor endocardial visualization, technically difficult study due to patient's body habitus and technically difficult study due to patient's heart rhythm.    Signed by: Laquetta Commander, MD on 05/06/2024 10:52 AM       MAR reviewed and pertinent medications noted or modified as needed    MEDS:   Current Facility-Administered Medications   Medication Dose Route Frequency Provider Last Rate Last Admin    piperacillin -tazobactam (ZOSYN ) 4,500 mg in sodium chloride  0.9 % 100 mL IVPB (addEASE)  4,500 mg IntraVENous Q8H Tobie Slate D, MD 25 mL/hr at 07/06/24 0651 4,500 mg at 07/06/24 0651    ipratropium 0.5 mg-albuterol  2.5 mg (DUONEB ) nebulizer solution 1 Dose  1 Dose Inhalation Q4H RT Tobie Slate D, MD   1 Dose at 07/06/24 0717    methylPREDNISolone  sodium succ (SOLU-MEDROL ) injection 40 mg  40 mg IntraVENous Daily Patel, Neha D, MD   40 mg at 07/06/24 9147    acetaminophen  (TYLENOL ) tablet 325 mg  325 mg Oral Q8H PRN Tobie Slate D, MD   325 mg at 07/04/24 2046    atorvastatin  (LIPITOR ) tablet 40 mg  40 mg Oral Nightly Patel, Neha D, MD   40 mg at 07/05/24 2026    budesonide -formoterol  (SYMBICORT ) 80-4.5 MCG/ACT inhaler 2 puff  2 puff Inhalation BID RT Tobie Slate D, MD   2 puff at 07/06/24 9282    guaiFENesin  (MUCINEX ) extended release tablet 600 mg  600 mg Oral BID Tobie Slate BIRCH, MD   600  mg at  07/06/24 0852    lactobacillus (CULTURELLE) capsule 1 capsule  1 capsule Oral Daily with breakfast Tobie Slate D, MD   1 capsule at 07/06/24 0851    melatonin tablet 5 mg  1 tablet Oral Nightly Tobie Slate D, MD   5 mg at 07/05/24 2026    metoprolol  tartrate (LOPRESSOR ) tablet 50 mg  50 mg Oral BID Patel, Neha D, MD   50 mg at 07/06/24 9148    OLANZapine  (ZYPREXA ) tablet 10 mg  10 mg Oral Nightly Patel, Neha D, MD   10 mg at 07/05/24 2026    pantoprazole  (PROTONIX ) tablet 40 mg  40 mg Oral Daily Patel, Neha D, MD   40 mg at 07/06/24 0851    sertraline  (ZOLOFT ) tablet 100 mg  100 mg Oral Daily Tobie Slate D, MD   100 mg at 07/06/24 9148    enoxaparin  (LOVENOX ) injection 120 mg  1 mg/kg SubCUTAneous BID Patel, Neha D, MD   120 mg at 07/06/24 9146    bumetanide  (BUMEX ) injection 1 mg  1 mg IntraVENous BID Patel, Neha D, MD   1 mg at 07/06/24 9147    doxycycline  hyclate (VIBRAMYCIN ) capsule 100 mg  100 mg Oral 2 times per day Tobie Slate D, MD   100 mg at 07/06/24 0851    sodium chloride  flush 0.9 % injection 5-40 mL  5-40 mL IntraVENous 2 times per day Tobie Slate D, MD   10 mL at 07/06/24 9147    sodium chloride  flush 0.9 % injection 5-40 mL  5-40 mL IntraVENous PRN Tobie Slate D, MD        0.9 % sodium chloride  infusion   IntraVENous PRN Tobie Slate BIRCH, MD        potassium chloride  (KLOR-CON  M) extended release tablet 40 mEq  40 mEq Oral PRN Tobie Slate D, MD        Or    potassium bicarb-citric acid  (EFFER-K ) effervescent tablet 40 mEq  40 mEq Oral PRN Tobie Slate D, MD   40 mEq at 07/05/24 1629    Or    potassium chloride  10 mEq/100 mL IVPB (Peripheral Line)  10 mEq IntraVENous PRN Tobie Slate D, MD        magnesium  sulfate 2000 mg in 50 mL IVPB premix  2,000 mg IntraVENous PRN Patel, Neha D, MD        polyethylene glycol (GLYCOLAX ) packet 17 g  17 g Oral Daily PRN Tobie Slate D, MD        acetaminophen  (TYLENOL ) tablet 650 mg  650 mg Oral Q6H PRN Patel, Neha D, MD   650 mg at 07/05/24 2320    Or    acetaminophen   (TYLENOL ) suppository 650 mg  650 mg Rectal Q6H PRN Patel, Neha D, MD        insulin  lispro (HUMALOG ,ADMELOG ) injection vial 0-8 Units  0-8 Units SubCUTAneous 4x Daily AC & HS Tobie Slate D, MD   4 Units at 07/05/24 2110    glucose chewable tablet 16 g  4 tablet Oral PRN Tobie Slate D, MD        dextrose  bolus 10% 125 mL  125 mL IntraVENous PRN Tobie Slate D, MD        Or    dextrose  bolus 10% 250 mL  250 mL IntraVENous PRN Tobie Slate D, MD        glucagon  injection 1 mg  1 mg SubCUTAneous PRN Tobie Slate BIRCH, MD  dextrose  10 % infusion   IntraVENous Continuous PRN Tobie Slate D, MD            ARTERIAL BLOOD GAS  Recent Labs     07/04/24  1747   PHART 7.44   PCO2ART 44   PO2ART 91   HCO3ART 29*   BEART 4.2*   FIO2A 36.0   O2SATART 97   OXYHEM 95.8   CARBOXHGBART 0.8*   METHGBART 0.3   TEMP 98.0     Labs:    Recent Labs     07/04/24  1214 07/04/24  1215 07/05/24  0500 07/06/24  0820   WBC  --  7.8 7.6 8.9   HGB  --  9.5* 8.9* 9.8*   PLT  --  149* 130* 137*   DDIMER 0.42  --   --   --      Recent Labs     07/04/24  1215 07/05/24  0500 07/06/24  0820   NA 146* 145 144   K 3.8 3.4* 3.0*   CL 107 102 99   CO2 31* 33* 36*   GLUCOSE 156* 204* 125*   BUN 21 20 21    CREATININE 0.89 0.80 0.91   CALCIUM  8.5* 8.3* 8.3*   BILITOT 0.7  --   --    AST 38*  --   --    ALT 21  --   --      No results for input(s): CKTOTAL, CKMB, CKMBINDEX, TROPONINI in the last 72 hours.  Lab Results   Component Value Date/Time    PROBNP 483 07/06/2024 08:20 AM      Lab Results   Component Value Date/Time    TSH 2.400 06/09/2024 01:50 PM      Results       Procedure Component Value Units Date/Time    MRSA by PCR [[id number]] Collected: 07/04/24 1830    Order Status: Completed Specimen: Swab Updated: 07/04/24 2033     MRSA by PCR Not Detected       Culture, Respiratory [[id number]]     Order Status: Canceled Specimen: Sputum Expectorated     Culture, Blood 1 [[id number]] Collected: 07/04/24 1215    Order Status: Completed Specimen: Blood  Updated: 07/06/24 0755     Special Requests --        No Special Requests  Left  Antecubital       Culture No growth 2 days       Culture, Blood 2 [[id number]] Collected: 07/04/24 1215    Order Status: Completed Specimen: Blood Updated: 07/06/24 0755     Special Requests No Special Requests        Culture No growth 2 days       COVID-19 & Influenza Combo [[id number]] Collected: 07/04/24 1215    Order Status: Completed Specimen: Nasopharynx Updated: 07/04/24 1328     SARS-CoV-2, PCR Not Detected        Comment: Not Detected results do not preclude SARS-CoV-2 infection and should not be used as the sole basis for patient management decisions. Results must be combined with clinical observations, patient history, and epidemiological information        Rapid Influenza A By PCR Not Detected        Rapid Influenza B By PCR Not Detected        Comment:    Testing was performed using cobas Liat SARS-CoV-2 and Influenza A/B nucleic acid assay.  This test is a multiplex Real-Time Reverse Transcriptas  Imaging:  XR SHOULDER RIGHT (MIN 2 VIEWS)   Final Result   No acute abnormality.      Electronically signed by REDELL CUNNING      XR CHEST 1 VIEW   Final Result   1. Persistent right lower lobe infiltrate consistent with pneumonia follow-up to   resolution is suggested.   2. There is pulmonary vascular congestion and possible mild interstitial edema..      Electronically signed by Evalene JINNY Hoit          XR SHOULDER RIGHT (MIN 2 VIEWS)  Result Date: 07/04/2024  EXAM: XR SHOULDER RIGHT (MIN 2 VIEWS) INDICATION: fall. FINDINGS: Three views of the right shoulder demonstrate no fracture, dislocation or other acute abnormality.     No acute abnormality. Electronically signed by REDELL CUNNING    XR CHEST 1 VIEW  Result Date: 07/04/2024  EXAM: XR CHEST 1 VIEW INDICATION: Fever/Suspected Infection/Sepsis COMPARISON: CT scan 06/04/2024 and chest radiograph 07/02/2024 and 06/10/2024 FINDINGS: A portable AP radiograph of the  chest was obtained at 1227 hours. The patient is on a cardiac monitor. There is moderate to severe cardiomegaly. Lungs are fairly well aerated. There is persistent airspace opacities laterally in the right lower lobe that have decreased in density but apparently increased in size consistent with persistent pneumonia in the right lower lobe. Follow-up to resolution is suggested. There is pulmonary vascular ingestion and possibly mild interstitial edema.     1. Persistent right lower lobe infiltrate consistent with pneumonia follow-up to resolution is suggested. 2. There is pulmonary vascular congestion and possible mild interstitial edema.. Electronically signed by Evalene JINNY Hoit       This care involved high complexity decision making which includes independently reviewing the patient's past medical records, current laboratory results, medication profiles that were immediately available to me and actual Xray images at the bedside in order to assess, support vital system function, and to treat this degree of vital organ system failure, and to prevent further life threatening deterioration of the patients condition.     Medical Decision Making Today  Reviewed the flowsheet and previous days notes  Reviewed and summarized records or history from previous days note or discussions with staff, family  Parenteral controlled substances - Reviewed/ Adjusted / Weaned / Started  High Risk Drug therapy requiring intensive monitoring for toxicity: eg steroids, pressors, antibiotics  Review and order of Clinical lab tests  Review and Order of Radiology tests  Review and Order of Medicine tests  Independent visualization of radiologic Images  Reviewed Ventilator / NiPPV  I have personally reviewed the patients ECG / Telemetry  Diagnostic endoscopies with identified risk factors      "

## 2024-07-06 NOTE — Discharge Summary (Addendum)
 "                   Physician Discharge Summary     Patient ID:    Breanna Lester  [id number]  63 y.o.  07-07-1961    Admit date: 07/04/2024    Discharge date : 07/06/2024      Final Diagnoses:   Acute respiratory failure (HCC) [J96.00]  COPD exacerbation (HCC) [J44.1]  CHF (congestive heart failure), NYHA class I, acute on chronic, combined (HCC) [I50.43]  Pneumonia of right lower lobe due to infectious organism [J18.9]  Pneumonia due to infectious organism, unspecified laterality, unspecified part of lung [J18.9]  Acute on chronic congestive heart failure, unspecified heart failure type (HCC) [I50.9]  Hypokalemia  Elevated troponin  Hypernatremia  Anemia of chronic disease      Reason for Hospitalization: Shortness of breath        Hospital Course:   Breanna Lester is a 63 y.o.  female with PMHx significant for HTN, HLD, HFpEF, COPD on 3 L, atrial fibrillation on Eliquis , DM2, liver cirrhosis, and other chronic conditions who presents due to shortness of breath.     Patient states that her symptoms started a couple of days ago.  She reports a nonproductive cough.  No fevers.  No nausea/vomiting.  No sick contacts.  No recent medication changes.  Denies recreational drug use.  Had a 20-pack-year history before.  Reports quitting smoking 16 years ago.  Denies alcohol use.  States that she is in talks with her pulmonologist for potential BiPAP but has been refusing.  Reports being on 3 L at home.     Per chart review, patient was admitted 10/14-10/27 for shortness of breath.  During that admission, patient was noted to be short of breath and had pneumonia.  She failed BiPAP and was intubated and transferred to ICU.  Blood culture showed Staph epidermidis at the time but no other organism was found.  Stroke alert was also called was confused.  CT head and CTA were negative.  MRI unable to do due to patient's claustrophobia.  She was transferred to the ICU and had worsening hypercapnia placed on BiPAP.  She continued to do  well and pulmonology cleared for discharge.     In the ED, patient was afebrile and vitals were stable except patient became hypoxic at 86% on room air.  CBC showed anemia of 9.5 with baseline around 10-11 along with mild thrombocytopenia of 149.  BMP showed hyponatremia of 146.  ALP was mildly elevated along with AST chronically.  BNP was 607.  Troponin was elevated at 18.8.  D-dimer was negative.  Blood cultures were obtained.  COVID and flu was negative.  UA showed no sign of infection.  CXR showed persistent right lower lobe infiltrate consistent with pneumonia along with pulmonary vascular congestion possible mild edema.  X-ray of right shoulder showed no acute findings.  Patient was given Bumex  1 mg, Rocephin , IV steroids, and azithromycin .     We were asked to admit for work up and evaluation of the above problems.      Patient continued to do well on Zosyn .  Blood cultures show no growth to date.  Pulmonology cleared for discharge.  Patient is on baseline oxygen.    Patient did have hypokalemia from aggressive diuresis.  This was replenished and repeat was normal.  Patient strongly desired to go home he reported feeling better.  Denied any new complaints.  Nursing at bedside no concern.  Patient discharged in stable condition with strict return precautions of fever, lethargy, chest pain/palpitations or shortness breath, Donnell pain, urinary symptoms.    Discussed with patient she needs to follow-up with her PCP closely.  She will need a repeat chest x-ray in in 4 weeks to confirm resolution of pneumonia.      Patient also needs to follow-up with pulmonology closely.  She reports understanding.      Of note, patient's chlorthalidone  was stopped as she was not taking this medication at home.  Her blood pressures have been stable.    Of note, I did discuss with patient at her last discharge summary states that she should be taking Eliquis  because of A-fib.  She states that her provider has stopped this  medication for unknown reason.  Told patient that she needs to follow-up with that provider.    Discharge Medications:      Medication List        START taking these medications      amoxicillin -clavulanate 875-125 MG per tablet  Commonly known as: AUGMENTIN   Take 1 tablet by mouth 2 times daily for 7 days     doxycycline  hyclate 100 MG capsule  Commonly known as: VIBRAMYCIN   Take 1 capsule by mouth every 12 hours for 5 days            CHANGE how you take these medications      predniSONE  20 MG tablet  Commonly known as: DELTASONE   Take 1 tablet by mouth 2 times daily for 5 days  What changed:   medication strength  how much to take  when to take this            CONTINUE taking these medications      acetaminophen  325 MG tablet  Commonly known as: TYLENOL   Take 1 tablet by mouth every 6 hours as needed for Pain     albuterol  sulfate HFA 108 (90 Base) MCG/ACT inhaler  Commonly known as: Ventolin  HFA  Inhale 2 puffs into the lungs 4 times daily as needed for Wheezing     atorvastatin  40 MG tablet  Commonly known as: LIPITOR   Take 1 tablet by mouth nightly     Blood Glucose Monitor System w/Device Kit  Pharmacist to identify preferred meter and strips.     blood glucose test strips  Test 1-2 times a day & as needed for symptoms of irregular blood glucose. Dispense sufficient amount for indicated testing frequency plus additional to accommodate PRN testing needs. Pharmacist to identify preferred brand.     bumetanide  1 MG tablet  Commonly known as: BUMEX   Take 1 tablet by mouth daily     guaiFENesin  600 MG extended release tablet  Commonly known as: MUCINEX   Take 1 tablet by mouth 2 times daily     lactobacillus capsule  Take 1 capsule by mouth daily (with breakfast)     Lancets 30G Misc  Test 1-2 times a day & as needed for symptoms of irregular blood glucose. Dispense sufficient amount for indicated testing frequency plus additional to accommodate PRN testing needs. Pharmacist to identify preferred brand.      Melatonin 10 MG Tabs     metoprolol  tartrate 50 MG tablet  Commonly known as: LOPRESSOR   Take 1 tablet by mouth 2 times daily     OLANZapine  10 MG tablet  Commonly known as: ZYPREXA   Take 1 tablet by mouth nightly     pantoprazole  40 MG tablet  Commonly known as:  PROTONIX   Take 1 tablet by mouth 2 times daily (before meals)     sertraline  100 MG tablet  Commonly known as: ZOLOFT   Take 1 tablet by mouth daily     Trelegy Ellipta  100-62.5-25 MCG/ACT Aepb inhaler  Generic drug: fluticasone -umeclidin-vilant  Inhale 1 puff into the lungs daily            STOP taking these medications      chlorthalidone  25 MG tablet  Commonly known as: HYGROTON      ondansetron  4 MG disintegrating tablet  Commonly known as: ZOFRAN -ODT            ASK your doctor about these medications      apixaban  5 MG Tabs tablet  Commonly known as: ELIQUIS   Take 1 tablet by mouth 2 times daily     metFORMIN  500 MG extended release tablet  Commonly known as: GLUCOPHAGE -XR               Where to Get Your Medications        These medications were sent to [facility] - [place], [place] - [address] - P [phone] [place] [phone]  [address] [place], [place] [address]      Phone: [phone]   amoxicillin -clavulanate 875-125 MG per tablet  doxycycline  hyclate 100 MG capsule  predniSONE  20 MG tablet           Follow up Care:    Follow-up Information       Follow up With Specialties Details Why Contact Info    Unknown, Provider  Call in 2 day(s)      Allauddin, Kristi, MD Internal Medicine Call in 1 week(s)  [address]  [address]  [place] [address]  [phone]                   Diet: Regular    Disposition:  Home    Advanced Directive:   FULL    DNR      Discharge Exam:                     Vitals:    07/06/24 1452   BP: 126/62   Pulse: 67   Resp: 18   Temp: 98.1 F (36.7 C)   SpO2: 92%      EENT: NC/AT. No obvious deformities.Nares patent. Oral mucosa moist.   Cardiovascular: S1, S2 present. No murmurs noted.    Respiratory: Normal respiratory effort.  Breath sounds diminished on right lower lobe.  No retractions noted  Abdomen: BS+. Soft, nontender. No TTP in all quadrants. No guarding or rigidity   Lower Extremity: Trace edema bilaterally   Neurological: CN III-XII grossly intact. Muscle strength 5/5 in UE and 5/5 in LE b/l. No facial drooping or slurring of words.   Psychiatric: Appropriate mood and affec         Significant Diagnostic Studies:   07/04/2024: BUN 21 mg/dL (Ref range: 8 - 23 mg/dL); Calcium  8.5 mg/dL (L; Ref range: 8.8 - 89.7 mg/dL); Chloride 107 mmol/L (Ref range: 98 - 107 mmol/L); CO2 31 mmol/L (H; Ref range: 20 - 29 mmol/L); Creatinine 0.89 mg/dL (Ref range: 9.39 - 8.99 mg/dL); Glucose 156 mg/dL (H; Ref range: 65 - 899 mg/dL); Hematocrit 33.1 % (L; Ref range: 35.0 - 47.0 %); Hemoglobin 9.5 g/dL (L; Ref range: 88.4 - 83.9 g/dL); Potassium 3.8 mmol/L (Ref range: 3.5 - 5.1 mmol/L); Sodium 146 mmol/L (H; Ref range: 136 -  145 mmol/L)  07/05/2024: BUN 20 mg/dL (Ref range: 8 - 23 mg/dL); Calcium  8.3 mg/dL (L; Ref range: 8.8 - 89.7 mg/dL); Chloride 102 mmol/L (Ref range: 98 - 107 mmol/L); CO2 33 mmol/L (H; Ref range: 20 - 29 mmol/L); Creatinine 0.80 mg/dL (Ref range: 9.39 - 8.99 mg/dL); Glucose 204 mg/dL (H; Ref range: 65 - 899 mg/dL); Hematocrit 31.1 % (L; Ref range: 35.0 - 47.0 %); Hemoglobin 8.9 g/dL (L; Ref range: 88.4 - 83.9 g/dL); Potassium 3.4 mmol/L (L; Ref range: 3.5 - 5.1 mmol/L); Sodium 145 mmol/L (Ref range: 136 - 145 mmol/L)  Recent Labs     07/05/24  0500 07/06/24  0820   WBC 7.6 8.9   HGB 8.9* 9.8*   HCT 31.1* 34.1*   PLT 130* 137*     Recent Labs     07/04/24  1215 07/05/24  0500 07/06/24  0820 07/06/24  1249   NA 146* 145 144  --    K 3.8 3.4* 3.0* 4.0   CL 107 102 99  --    CO2 31* 33* 36*  --    BUN 21 20 21   --    CREATININE 0.89 0.80 0.91  --    GLUCOSE 156* 204* 125*  --    CALCIUM  8.5* 8.3* 8.3*  --    MG  --   --  1.8  --      Recent Labs     07/04/24  1215   AST 38*   ALT 21   ALKPHOS  177*   BILITOT 0.7   GLOB 3.1     No results for input(s): INR, PROTIME, APTT in the last 72 hours.   No results for input(s): IRON, TIBC in the last 72 hours.    Invalid input(s): PSAT, FERR   No results for input(s): PH, PCO2, PO2 in the last 72 hours.  No results for input(s): CKTOTAL, CKMB, TROPONINI in the last 72 hours.  Lab Results   Component Value Date/Time    POCGLU 193 07/06/2024 11:12 AM    POCGLU 124 07/06/2024 08:10 AM    POCGLU 258 07/05/2024 08:30 PM    POCGLU 249 07/05/2024 03:42 PM    POCGLU 257 07/05/2024 11:51 AM         Discharge time spent 35 minutes    Signed:  Nilsa JONETTA Blanch, MD  07/06/2024  2:56 PM    "

## 2024-07-06 NOTE — Care Coordination (Addendum)
"  DC order noted.  Chart reviewed.  The pt is expected to discharge on Monday 11/17.  Per primary RN, the pt lives in a hotel and has had x1 family/friend pay for this nights room.  She lives with her daughter who is reported to have an intellectual disability.  The pt is on O2 at home and is expected to return to the hotel via transport arranged with voucher for the cab.  No CM intervention required at this time.      Update:  The pt will DC this date.  Transportation to be arranged by floor.    Transition of Care Plan:    RUR: 35%  Prior Level of Functioning: ind  Disposition: Home/Hotel  EDD: 11/16  If SNF or IPR: Date Freedom of Choice offered: na  Date Freedom of Choice received:   Accepting facility:   Date authorization started with reference number:   Date authorization received and expires:   Follow up appointments:   DME needed: none requested  Transportation at discharge:   Ambulance transport?  Pt and family notified of potential financial responsibility for transport?          Who?  IM/IMM Medicare/Tricare letter given:   Is patient a Veteran and connected with VA?    If yes, was [employer] transfer form completed and VA notified?   Caregiver Contact:   Discharge Caregiver contacted prior to discharge?   Care Conference needed?   Barriers to discharge:  none reported  "

## 2024-07-08 LAB — CULTURE, BLOOD 2: Culture: NO GROWTH

## 2024-07-10 LAB — CULTURE, BLOOD 2

## 2024-07-10 LAB — CULTURE, BLOOD 1: Culture: NO GROWTH

## 2024-07-24 ENCOUNTER — Emergency Department: Admit: 2024-07-24 | Payer: Medicare (Managed Care) | Primary: Diagnostic Radiology

## 2024-07-24 ENCOUNTER — Inpatient Hospital Stay
Admit: 2024-07-24 | Discharge: 2024-07-25 | Disposition: A | Discharge status: Home / Self Care | Payer: Medicare (Managed Care) | Arrived: AM | Attending: Emergency Medicine

## 2024-07-24 DIAGNOSIS — J441 Chronic obstructive pulmonary disease with (acute) exacerbation: Principal | ICD-10-CM

## 2024-07-24 LAB — TROPONIN: Troponin T: 19.4 ng/L — ABNORMAL HIGH (ref 0–14)

## 2024-07-24 LAB — CBC WITH AUTO DIFFERENTIAL
Basophils %: 0.4 % (ref 0.0–1.0)
Basophils Absolute: 0.03 K/UL (ref 0.00–0.10)
Eosinophils %: 1.2 % (ref 0.0–7.0)
Eosinophils Absolute: 0.1 K/UL (ref 0.00–0.40)
Hematocrit: 32 % — ABNORMAL LOW (ref 35.0–47.0)
Hemoglobin: 9 g/dL — ABNORMAL LOW (ref 11.5–16.0)
Immature Granulocytes %: 0.4 % (ref 0–0.5)
Immature Granulocytes Absolute: 0.03 K/UL (ref 0.00–0.04)
Lymphocytes %: 21.3 % (ref 12.0–49.0)
Lymphocytes Absolute: 1.75 K/UL (ref 0.80–3.50)
MCH: 25.2 pg — ABNORMAL LOW (ref 26.0–34.0)
MCHC: 28.1 g/dL — ABNORMAL LOW (ref 30.0–36.5)
MCV: 89.6 FL (ref 80.0–99.0)
MPV: 12.6 FL (ref 8.9–12.9)
Monocytes %: 6.5 % (ref 5.0–13.0)
Monocytes Absolute: 0.53 K/UL (ref 0.00–1.00)
Neutrophils %: 70.2 % (ref 32.0–75.0)
Neutrophils Absolute: 5.76 K/UL (ref 1.80–8.00)
Nucleated RBCs: 0 /100{WBCs}
Platelets: 92 K/uL — ABNORMAL LOW (ref 150–400)
RBC: 3.57 M/uL — ABNORMAL LOW (ref 3.80–5.20)
RDW: 16.5 % — ABNORMAL HIGH (ref 11.5–14.5)
WBC: 8.2 K/uL (ref 3.6–11.0)
nRBC: 0 K/uL (ref 0.00–0.01)

## 2024-07-24 LAB — BASIC METABOLIC PANEL
Anion Gap: 6 mmol/L (ref 2–14)
BUN/Creatinine Ratio: 24 — ABNORMAL HIGH (ref 12–20)
BUN: 18 mg/dL (ref 8–23)
CO2: 34 mmol/L — ABNORMAL HIGH (ref 20–29)
Calcium: 8.8 mg/dL (ref 8.8–10.2)
Chloride: 101 mmol/L (ref 98–107)
Creatinine: 0.76 mg/dL (ref 0.60–1.00)
Est, Glom Filt Rate: 88 ml/min/1.73m2 (ref >59)
Glucose: 252 mg/dL — ABNORMAL HIGH (ref 65–100)
Potassium: 4 mmol/L (ref 3.5–5.1)
Sodium: 140 mmol/L (ref 136–145)

## 2024-07-24 LAB — COVID-19 & INFLUENZA COMBO
Rapid Influenza A By PCR: NOT DETECTED
Rapid Influenza B By PCR: NOT DETECTED
SARS-CoV-2, PCR: NOT DETECTED

## 2024-07-24 LAB — MAGNESIUM: Magnesium: 2 mg/dL (ref 1.6–2.4)

## 2024-07-24 MED ORDER — DOXYCYCLINE HYCLATE 100 MG PO TABS
100 | ORAL_TABLET | Freq: Two times a day (BID) | ORAL | 0 refills | 7.00000 days | Status: AC
Start: 2024-07-24 — End: 2024-07-31

## 2024-07-24 MED ORDER — IPRATROPIUM-ALBUTEROL 0.5-2.5 (3) MG/3ML IN SOLN
0.5-2.5 | RESPIRATORY_TRACT | Status: AC
Start: 2024-07-24 — End: 2024-07-24
  Administered 2024-07-24: 22:00:00 1 via RESPIRATORY_TRACT

## 2024-07-24 MED ORDER — KETOROLAC TROMETHAMINE 30 MG/ML IJ SOLN
30 | INTRAMUSCULAR | Status: AC
Start: 2024-07-24 — End: 2024-07-24
  Administered 2024-07-24: 30 mg via INTRAMUSCULAR

## 2024-07-24 MED ORDER — SERTRALINE HCL 100 MG PO TABS
100 | ORAL_TABLET | Freq: Every day | ORAL | 0 refills | 90.00000 days | Status: AC
Start: 2024-07-24 — End: ?

## 2024-07-24 MED ORDER — DOXYCYCLINE HYCLATE 100 MG PO CAPS
100 | ORAL | Status: AC
Start: 2024-07-24 — End: 2024-07-24
  Administered 2024-07-24: 100 mg via ORAL

## 2024-07-24 MED ORDER — ALBUTEROL SULFATE HFA 108 (90 BASE) MCG/ACT IN AERS
108 | Freq: Four times a day (QID) | RESPIRATORY_TRACT | 0 refills | 25.00000 days | Status: AC | PRN
Start: 2024-07-24 — End: ?

## 2024-07-24 MED ORDER — METHYLPREDNISOLONE SODIUM SUCC 125 MG IJ SOLR
125 | INTRAMUSCULAR | Status: AC
Start: 2024-07-24 — End: 2024-07-24
  Administered 2024-07-24: 22:00:00 125 mg via INTRAVENOUS

## 2024-07-24 MED FILL — METHYLPREDNISOLONE SODIUM SUCC 125 MG IJ SOLR: 125 mg | INTRAMUSCULAR | Qty: 125 | Fill #0

## 2024-07-24 MED FILL — DOXYCYCLINE HYCLATE 100 MG PO CAPS: 100 mg | ORAL | Qty: 1 | Fill #0

## 2024-07-24 MED FILL — KETOROLAC TROMETHAMINE 30 MG/ML IJ SOLN: 30 mg/mL | INTRAMUSCULAR | Qty: 1 | Fill #0

## 2024-07-24 MED FILL — IPRATROPIUM-ALBUTEROL 0.5-2.5 (3) MG/3ML IN SOLN: 0.5-2.5 (3) MG/3ML | RESPIRATORY_TRACT | Qty: 3 | Fill #0

## 2024-07-24 NOTE — ED Triage Notes (Signed)
"  PT arrives from home complaints of sob and cp. Pt has home O2. Pt is on 3L at home.   "

## 2024-07-24 NOTE — Discharge Instructions (Signed)
 "    Thank you for choosing our Emergency Department for your care.  It is our privilege to care for you in your time of need.  In the next several days, you may receive a survey via email or mailed to your home about your experience with our team.  We would greatly appreciate you taking a few minutes to complete the survey, as we use this information to learn what we have done well and what we could be doing better. Thank you for trusting us  with your care!    Below you will find a list of your tests from today's visit.   Labs and Radiology Studies  Recent Results (from the past 12 hours)   BMP    Collection Time: 07/24/24  4:54 PM   Result Value Ref Range    Sodium 140 136 - 145 mmol/L    Potassium 4.0 3.5 - 5.1 mmol/L    Chloride 101 98 - 107 mmol/L    CO2 34 (H) 20 - 29 mmol/L    Anion Gap 6 2 - 14 mmol/L    Glucose 252 (H) 65 - 100 mg/dL    BUN 18 8 - 23 mg/dL    Creatinine 9.23 9.39 - 1.00 mg/dL    BUN/Creatinine Ratio 24 (H) 12 - 20      Est, Glom Filt Rate 88 >59 ml/min/1.1m2    Calcium  8.8 8.8 - 10.2 mg/dL   CBC with Auto Differential    Collection Time: 07/24/24  4:54 PM   Result Value Ref Range    WBC 8.2 3.6 - 11.0 K/uL    RBC 3.57 (L) 3.80 - 5.20 M/uL    Hemoglobin 9.0 (L) 11.5 - 16.0 g/dL    Hematocrit 67.9 (L) 35.0 - 47.0 %    MCV 89.6 80.0 - 99.0 FL    MCH 25.2 (L) 26.0 - 34.0 PG    MCHC 28.1 (L) 30.0 - 36.5 g/dL    RDW 83.4 (H) 88.4 - 14.5 %    Platelets 92 (L) 150 - 400 K/uL    MPV 12.6 8.9 - 12.9 FL    Nucleated RBCs 0.0 0.0 PER 100 WBC    nRBC 0.00 0.00 - 0.01 K/uL    Neutrophils % 70.2 32.0 - 75.0 %    Lymphocytes % 21.3 12.0 - 49.0 %    Monocytes % 6.5 5.0 - 13.0 %    Eosinophils % 1.2 0.0 - 7.0 %    Basophils % 0.4 0.0 - 1.0 %    Immature Granulocytes % 0.4 0 - 0.5 %    Neutrophils Absolute 5.76 1.80 - 8.00 K/UL    Lymphocytes Absolute 1.75 0.80 - 3.50 K/UL    Monocytes Absolute 0.53 0.00 - 1.00 K/UL    Eosinophils Absolute 0.10 0.00 - 0.40 K/UL    Basophils Absolute 0.03 0.00 - 0.10 K/UL     Immature Granulocytes Absolute 0.03 0.00 - 0.04 K/UL    Differential Type Smear Scanned      RBC Comment Anisocytosis  1+        RBC Comment Ovalocytes  1+       Magnesium     Collection Time: 07/24/24  4:54 PM   Result Value Ref Range    Magnesium  2.0 1.6 - 2.4 mg/dL   Troponin    Collection Time: 07/24/24  4:54 PM   Result Value Ref Range    Troponin T 19.4 (H) 0 - 14 ng/L   COVID-19 & Influenza  Combo    Collection Time: 07/24/24  5:13 PM    Specimen: Nasopharynx   Result Value Ref Range    SARS-CoV-2, PCR Not Detected Not Detected      Rapid Influenza A By PCR Not Detected Not Detected      Rapid Influenza B By PCR Not Detected Not Detected       XR CHEST PORTABLE  Result Date: 07/24/2024  EXAM:  XR CHEST PORTABLE INDICATION: SOB COMPARISON: 07/04/2024 TECHNIQUE: portable chest AP view FINDINGS: The heart is significantly enlarged. There is unchanged mild edema. There is increased small right pleural effusion. There is unchanged patchy opacification in the right lower lobe. The visualized bones and upper abdomen are age-appropriate.     1. New small right pleural effusion. 2. Patchy airspace disease or atelectasis in the right lower lobe. 3. Unchanged cardiomegaly and mild edema Electronically signed by MASSIE BRAVO PEAT    ------------------------------------------------------------------------------------------------------------  The evaluation and treatment you received in the Emergency Department were for an urgent problem. It is important that you follow-up with a doctor, nurse practitioner, or physician assistant to:  (1) confirm your diagnosis,  (2) re-evaluation of changes in your illness and treatment, and (3) for ongoing care. Please take your discharge instructions with you when you go to your follow-up appointment.     If you have any problem arranging a follow-up appointment, contact us !  If your symptoms become worse or you do not improve as expected, please return to us . We are available 24 hours a day.      If a prescription has been provided, please fill it as soon as possible to prevent a delay in treatment. If you have any questions or reservations about taking the medication due to side effects or interactions with other medications, please call your primary care provider or contact us  directly.  Again, THANK YOU for choosing us  to care for YOU!    "

## 2024-07-24 NOTE — ED Provider Notes (Signed)
 "[facility]  EMERGENCY DEPARTMENT HISTORY AND PHYSICAL EXAM      Date of evaluation: 07/24/2024  Patient Name: Breanna Lester  MRN: [id number]  ED Physician or APP: Carlin Lamar Gula, MD   Note Started: 5:06 PM EST 07/24/24  Chief Complaint:   Chief Complaint   Patient presents with    Shortness of Breath       HISTORY OF PRESENT ILLNESS   History Provided By: Patient, EMS     HPI: Breanna Lester is a 63 y.o. female presents for evaluation of shortness of breath and chest pain.  Patient reports chronic chest pain.  EMS states they were called for the same.  Patient is supposed be on 3 L nasal cannula at home at her baseline given her history of COPD.  EMS states they gave a DuoNeb  and route.  Patient denies fevers or chills, denies nausea or vomiting.    PAST MEDICAL HISTORY   Past Medical History:  Past Medical History:   Diagnosis Date    Anxiety     Depression     Gastrointestinal disorder     Heart failure (HCC)     Hyperlipidemia     Hypertension     Liver disease 2009    liver failure    Pneumonia        Past Surgical History:  Past Surgical History:   Procedure Laterality Date    ADENOIDECTOMY  1972    BACK SURGERY      cement discs following a fall    CATARACT REMOVAL      CESAREAN SECTION  1986 and 1993    DILATION AND CURETTAGE OF UTERUS  1999    EGD TRANSORAL BIOPSY SINGLE/MULTIPLE  05/17/2009         HYSTERECTOMY (CERVIX STATUS UNKNOWN)  2008    TONSILLECTOMY  1972    UPPER GASTROINTESTINAL ENDOSCOPY N/A 04/17/2024    ESOPHAGOGASTRODUODENOSCOPY performed by Madlyn Fendt, MD at [facility] ENDOSCOPY       Allergies:  Allergies   Allergen Reactions    Latex Rash       PCP: Unknown, Provider  PHYSICAL EXAM       ED TRIAGE VITALS  BP: 134/70, Temp: 97.5 F (36.4 C), Pulse: 62, Respirations: 16, SpO2: 100 %  Physical Exam Physical Exam  Constitutional:       Appearance: Normal appearance. Not toxic-appearing.   HENT:      Head: Normocephalic and atraumatic.      Nose: Nose normal.      Mouth/Throat:       Mouth: Mucous membranes are moist.   Eyes:      Extraocular Movements: Extraocular movements intact.      Pupils: Pupils are equal, round, and reactive to light.   Cardiovascular:      Rate and Rhythm: Bradycardic.      Pulses: Normal pulses.   Pulmonary:      Effort: Pulmonary effort is normal.      Breath sounds: No stridor, clear to auscultation bilaterally  Abdominal:      General: Abdomen is flat. There is no distension.   Musculoskeletal:         General: Normal range of motion.      Cervical back: Normal range of motion and neck supple.   Skin:     General: Skin is warm and dry.      Capillary Refill: Capillary refill takes less than 2 seconds.   Neurological:  General: No focal deficit present.      Mental Status: Alert and oriented to person, place, and time.         SCREENINGS                No data recorded     LAB, EKG AND IMAGING RESULTS   Labs:  Recent Results (from the past 12 hours)   BMP    Collection Time: 07/24/24  4:54 PM   Result Value Ref Range    Sodium 140 136 - 145 mmol/L    Potassium 4.0 3.5 - 5.1 mmol/L    Chloride 101 98 - 107 mmol/L    CO2 34 (H) 20 - 29 mmol/L    Anion Gap 6 2 - 14 mmol/L    Glucose 252 (H) 65 - 100 mg/dL    BUN 18 8 - 23 mg/dL    Creatinine 9.23 9.39 - 1.00 mg/dL    BUN/Creatinine Ratio 24 (H) 12 - 20      Est, Glom Filt Rate 88 >59 ml/min/1.31m2    Calcium  8.8 8.8 - 10.2 mg/dL   CBC with Auto Differential    Collection Time: 07/24/24  4:54 PM   Result Value Ref Range    WBC 8.2 3.6 - 11.0 K/uL    RBC 3.57 (L) 3.80 - 5.20 M/uL    Hemoglobin 9.0 (L) 11.5 - 16.0 g/dL    Hematocrit 67.9 (L) 35.0 - 47.0 %    MCV 89.6 80.0 - 99.0 FL    MCH 25.2 (L) 26.0 - 34.0 PG    MCHC 28.1 (L) 30.0 - 36.5 g/dL    RDW 83.4 (H) 88.4 - 14.5 %    Platelets 92 (L) 150 - 400 K/uL    MPV 12.6 8.9 - 12.9 FL    Nucleated RBCs 0.0 0.0 PER 100 WBC    nRBC 0.00 0.00 - 0.01 K/uL    Neutrophils % 70.2 32.0 - 75.0 %    Lymphocytes % 21.3 12.0 - 49.0 %    Monocytes % 6.5 5.0 - 13.0 %    Eosinophils  % 1.2 0.0 - 7.0 %    Basophils % 0.4 0.0 - 1.0 %    Immature Granulocytes % 0.4 0 - 0.5 %    Neutrophils Absolute 5.76 1.80 - 8.00 K/UL    Lymphocytes Absolute 1.75 0.80 - 3.50 K/UL    Monocytes Absolute 0.53 0.00 - 1.00 K/UL    Eosinophils Absolute 0.10 0.00 - 0.40 K/UL    Basophils Absolute 0.03 0.00 - 0.10 K/UL    Immature Granulocytes Absolute 0.03 0.00 - 0.04 K/UL    Differential Type Smear Scanned      RBC Comment Anisocytosis  1+        RBC Comment Ovalocytes  1+       Magnesium     Collection Time: 07/24/24  4:54 PM   Result Value Ref Range    Magnesium  2.0 1.6 - 2.4 mg/dL   Troponin    Collection Time: 07/24/24  4:54 PM   Result Value Ref Range    Troponin T 19.4 (H) 0 - 14 ng/L   COVID-19 & Influenza Combo    Collection Time: 07/24/24  5:13 PM    Specimen: Nasopharynx   Result Value Ref Range    SARS-CoV-2, PCR Not Detected Not Detected      Rapid Influenza A By PCR Not Detected Not Detected      Rapid Influenza B By PCR Not  Detected Not Detected         EKG:.EKG interpreted by me. Shows Sinus Bradycardia with a HR of 46 bpm with premature atrial complexes.  ST and T wave abnormality, consider anterior ischemia.  Prolonged QT.  Abnormal EKG. No STEMI.    Radiologic Studies:  Images preliminarily interpreted by the ED Provider with the following findings: Not Applicable..     Radiologist Interpretation at the time of this note:   XR CHEST PORTABLE   Final Result   1. New small right pleural effusion.   2. Patchy airspace disease or atelectasis in the right lower lobe.   3. Unchanged cardiomegaly and mild edema         Electronically signed by MASSIE FORBES PACE           Records Reviewed: See ED Course    MEDICAL DECISION MAKING and ED COURSE   6:27 PM Differential and Considerations of tests not ordered: Patient presents with shortness of breath and history of COPD, likely exacerbation of the same.  Medical record indicates history of heart failure as well, concern for the same though her last echo showed normal  systolic and diastolic function.  Will also evaluate for hemoglobin or metabolic or renal abnormality, versus ACS or cardiac dysrhythmia.  Afebrile nontoxic, less likely serious bacterial infection.     ED Vitals:    Vitals:    07/24/24 1654 07/24/24 1712 07/24/24 1715 07/24/24 1730   BP:   132/74 134/70   Pulse: 97 (!) 49 51 62   Resp: 17 16 17 16    Temp:       TempSrc:       SpO2: 95% 96%  100%   Weight:       Height:           Medications Given:   Medications   doxycycline  hyclate (VIBRAMYCIN ) capsule 100 mg (has no administration in time range)   ketorolac  (TORADOL ) injection 30 mg (has no administration in time range)   ipratropium 0.5 mg-albuterol  2.5 mg (DUONEB ) nebulizer solution 1 Dose (1 Dose Inhalation Given 07/24/24 1653)   methylPREDNISolone  sodium succ (SOLU-MEDROL ) injection 125 mg (125 mg IntraVENous Given 07/24/24 1711)       ED COURSE  ED Course as of 07/24/24 1827   Thu Jul 24, 2024   1824 Discussed with patient the workup including stable mildly elevated troponin and x-ray that shows potentially pneumonia.  Will treat empirically for the same, will refill her albuterol  inhaler as well.  Discussed the need for close follow-up as well as return precautions. [CS]      ED Course User Index  [CS] Roselyn Carlin Charleston, MD       Clinical Management Tools: Not Applicable    Smoking Cessation: Not Applicable    CONSULTS: See ED Course/MDM for further details.  None     PROCEDURES   See ED course/ MDM for any additional procedures.  Procedures      SEPSIS REASSESSMENT & CRITICAL CARE TIME   SEPSIS REASSESSMENT: No suspicion of bacterial infection and not having 2 SIRS during this visit.    Critical Care: Patient does not meet Critical Care Time, 0 minutes  CLINICAL IMPRESSIONS     1. COPD exacerbation (HCC)    2. Community acquired pneumonia, unspecified laterality       SDOH/DISPOSITION/PLAN   Social Determinants affecting Treatment Plan: None    DISPOSITION Decision To Discharge 07/24/2024 06:25:07 PM    DISPOSITION CONDITION Stable  Discharge Note: The patient is stable for discharge. The signs, symptoms, diagnosis, and discharge instructions have been discussed, understanding conveyed, and agreed upon. The patient is to follow up as recommended or return to ER should their symptoms worsen.   FOLLOWUP: [facility] Emergency Department  [address] [place]  [phone]  [phone]    If symptoms worsen   DISCHARGE MEDS:      Medication List        START taking these medications      doxycycline  hyclate 100 MG tablet  Commonly known as: VIBRA -TABS  Take 1 tablet by mouth 2 times daily for 7 days            CHANGE how you take these medications      * albuterol  sulfate HFA 108 (90 Base) MCG/ACT inhaler  Commonly known as: Ventolin  HFA  Inhale 2 puffs into the lungs 4 times daily as needed for Wheezing  What changed: Another medication with the same name was added. Make sure you understand how and when to take each.     * albuterol  sulfate HFA 108 (90 Base) MCG/ACT inhaler  Commonly known as: Ventolin  HFA  Inhale 2 puffs into the lungs 4 times daily as needed for Wheezing  What changed: You were already taking a medication with the same name, and this prescription was added. Make sure you understand how and when to take each.           * This list has 2 medication(s) that are the same as other medications prescribed for you. Read the directions carefully, and ask your doctor or other care provider to review them with you.                CONTINUE taking these medications      Blood Glucose Monitor System w/Device Kit  Pharmacist to identify preferred meter and strips.     Lancets 30G Misc  Test 1-2 times a day & as needed for symptoms of irregular blood glucose. Dispense sufficient amount for indicated testing frequency plus additional to accommodate PRN testing needs. Pharmacist to identify preferred brand.     sertraline  100 MG tablet  Commonly known as: ZOLOFT   Take 1 tablet by  mouth daily            ASK your doctor about these medications      acetaminophen  325 MG tablet  Commonly known as: TYLENOL   Take 1 tablet by mouth every 6 hours as needed for Pain     apixaban  5 MG Tabs tablet  Commonly known as: ELIQUIS   Take 1 tablet by mouth 2 times daily     atorvastatin  40 MG tablet  Commonly known as: LIPITOR   Take 1 tablet by mouth nightly     blood glucose test strips  Test 1-2 times a day & as needed for symptoms of irregular blood glucose. Dispense sufficient amount for indicated testing frequency plus additional to accommodate PRN testing needs. Pharmacist to identify preferred brand.     bumetanide  1 MG tablet  Commonly known as: BUMEX   Take 1 tablet by mouth daily     guaiFENesin  600 MG extended release tablet  Commonly known as: MUCINEX   Take 1 tablet by mouth 2 times daily     lactobacillus capsule  Take 1 capsule by mouth daily (with breakfast)     Melatonin 10 MG Tabs     metFORMIN  500 MG extended release tablet  Commonly known as: GLUCOPHAGE -XR  metoprolol  tartrate 50 MG tablet  Commonly known as: LOPRESSOR   Take 1 tablet by mouth 2 times daily     OLANZapine  10 MG tablet  Commonly known as: ZYPREXA   Take 1 tablet by mouth nightly     pantoprazole  40 MG tablet  Commonly known as: PROTONIX   Take 1 tablet by mouth 2 times daily (before meals)               Where to Get Your Medications        These medications were sent to [facility] [address], [place] - [address] - [place] [phone] [facility] [phone]  [address] [place], [place] [address]      Phone: [phone]   albuterol  sulfate HFA 108 (90 Base) MCG/ACT inhaler  doxycycline  hyclate 100 MG tablet  sertraline  100 MG tablet           I am the Primary Clinician of Record. Carlin Lamar Gula, MD (electronically signed)    (Please note that parts of this dictation were completed with voice recognition software. Quite often unanticipated grammatical, syntax, homophones, and other interpretive errors are  inadvertently transcribed by the computer software. Please disregards these errors. Please excuse any errors that have escaped final proofreading.)     Gula Carlin Lamar, MD  07/24/24 1827    "

## 2024-07-25 LAB — EKG 12-LEAD
Atrial Rate: 46 {beats}/min
P Axis: 72 degrees
P-R Interval: 150 ms
Q-T Interval: 540 ms
QRS Duration: 76 ms
QTc Calculation (Bazett): 472 ms
R Axis: 65 degrees
T Axis: 82 degrees
Ventricular Rate: 46 {beats}/min

## 2024-08-02 ENCOUNTER — Emergency Department: Admit: 2024-08-02 | Payer: Medicare (Managed Care) | Primary: Diagnostic Radiology

## 2024-08-02 ENCOUNTER — Inpatient Hospital Stay
Admit: 2024-08-02 | Discharge: 2024-08-03 | Disposition: A | Discharge status: Still In Hospital | Payer: Medicare (Managed Care) | Arrived: AM

## 2024-08-02 DIAGNOSIS — J441 Chronic obstructive pulmonary disease with (acute) exacerbation: Principal | ICD-10-CM

## 2024-08-02 LAB — CBC WITH AUTO DIFFERENTIAL
Basophils %: 0.5 % (ref 0.0–1.0)
Basophils Absolute: 0.03 K/UL (ref 0.00–0.10)
Eosinophils %: 1 % (ref 0.0–7.0)
Eosinophils Absolute: 0.06 K/UL (ref 0.00–0.40)
Hematocrit: 33.2 % — ABNORMAL LOW (ref 35.0–47.0)
Hemoglobin: 9.1 g/dL — ABNORMAL LOW (ref 11.5–16.0)
Immature Granulocytes %: 0.2 % (ref 0.0–0.5)
Immature Granulocytes Absolute: 0.01 K/UL (ref 0.00–0.04)
Lymphocytes %: 20.4 % (ref 12.0–49.0)
Lymphocytes Absolute: 1.27 K/UL (ref 0.80–3.50)
MCH: 25.3 pg — ABNORMAL LOW (ref 26.0–34.0)
MCHC: 27.4 g/dL — ABNORMAL LOW (ref 30.0–36.5)
MCV: 92.2 FL (ref 80.0–99.0)
MPV: 12.6 FL (ref 8.9–12.9)
Monocytes %: 6.3 % (ref 5.0–13.0)
Monocytes Absolute: 0.39 K/UL (ref 0.00–1.00)
Neutrophils %: 71.6 % (ref 32.0–75.0)
Neutrophils Absolute: 4.46 K/UL (ref 1.80–8.00)
Platelets: 121 K/uL — ABNORMAL LOW (ref 150–400)
RBC: 3.6 M/uL — ABNORMAL LOW (ref 3.80–5.20)
RDW: 16.5 % — ABNORMAL HIGH (ref 11.5–14.5)
WBC: 6.2 K/uL (ref 3.6–11.0)

## 2024-08-02 LAB — EKG 12-LEAD
Atrial Rate: 56 {beats}/min
P Axis: 48 degrees
P-R Interval: 142 ms
Q-T Interval: 498 ms
QRS Duration: 88 ms
QTc Calculation (Bazett): 480 ms
R Axis: 80 degrees
T Axis: 88 degrees
Ventricular Rate: 56 {beats}/min

## 2024-08-02 LAB — POC LACTIC ACID: POC Lactic Acid: 1.99 mmol/L (ref 0.40–2.00)

## 2024-08-02 LAB — TROPONIN: Troponin T: 16.1 ng/L — ABNORMAL HIGH (ref 0–14)

## 2024-08-02 LAB — VENOUS BLOOD GAS, POINT OF CARE
BASE EXCESS, VENOUS (POC): 6.6 mmol/L
HCO3, Venous: 33.2 mmol/L — ABNORMAL HIGH (ref 22.0–26.0)
PCO2, Venus, POC: 57.3 mmHg — ABNORMAL HIGH (ref 41.0–52.0)
PH, VENOUS (POC): 7.37 (ref 7.23–7.44)
PO2, VENOUS (POC): 43 mmHg — ABNORMAL HIGH (ref 25–40)
SO2, VENOUS (POC): 76.1 %

## 2024-08-02 MED ORDER — IPRATROPIUM-ALBUTEROL 0.5-2.5 (3) MG/3ML IN SOLN
0.5-2.5 | RESPIRATORY_TRACT | Status: AC
Start: 2024-08-02 — End: 2024-08-02
  Administered 2024-08-02 (×2): 1 via RESPIRATORY_TRACT

## 2024-08-02 MED ORDER — IOPAMIDOL 76 % IV SOLN
76 | Freq: Once | INTRAVENOUS | Status: AC
Start: 2024-08-02 — End: 2024-08-02
  Administered 2024-08-03: 100 mL via INTRAVENOUS

## 2024-08-02 MED ORDER — METHYLPREDNISOLONE SODIUM SUCC 125 MG IJ SOLR
125 | Freq: Once | INTRAMUSCULAR | Status: AC
Start: 2024-08-02 — End: 2024-08-02
  Administered 2024-08-02: 80 mg via INTRAVENOUS

## 2024-08-02 MED ORDER — LIDOCAINE 4 % EX PTCH
4 | CUTANEOUS | Status: DC
Start: 2024-08-02 — End: 2024-08-03
  Administered 2024-08-02: 1 via TRANSDERMAL

## 2024-08-02 MED ORDER — KETOROLAC TROMETHAMINE 30 MG/ML IJ SOLN
30 | INTRAMUSCULAR | Status: AC
Start: 2024-08-02 — End: 2024-08-02
  Administered 2024-08-02: 30 mg via INTRAVENOUS

## 2024-08-02 MED FILL — IPRATROPIUM-ALBUTEROL 0.5-2.5 (3) MG/3ML IN SOLN: 0.5-2.5 (3) MG/3ML | RESPIRATORY_TRACT | Qty: 3 | Fill #0

## 2024-08-02 MED FILL — KETOROLAC TROMETHAMINE 30 MG/ML IJ SOLN: 30 mg/mL | INTRAMUSCULAR | Qty: 1 | Fill #0

## 2024-08-02 MED FILL — METHYLPREDNISOLONE SODIUM SUCC 125 MG IJ SOLR: 125 mg | INTRAMUSCULAR | Qty: 125 | Fill #0

## 2024-08-02 MED FILL — ISOVUE-370 76 % IV SOLN: 76 % | INTRAVENOUS | Qty: 100 | Fill #0

## 2024-08-02 MED FILL — LIDOCAINE PAIN RELIEF 4 % EX PTCH: 4 % | CUTANEOUS | Qty: 1 | Fill #0

## 2024-08-02 NOTE — ED Notes (Incomplete)
"  TRANSFER - OUT REPORT:    Verbal report given to Shelvy Leech Emergency Department RN, [place] on Breanna Lester  being transferred to [facility] ED for routine progression of patient care       Report consisted of patient's Situation, Background, Assessment and   Recommendations(SBAR).     Information from the following report(s) Nurse Handoff Report, Index, ED Encounter Summary, ED SBAR, Adult Overview, Intake/Output, MAR, Recent Results, Med Rec Status, Cardiac Rhythm a-fib, Quality Measures, Neuro Assessment, and Event Log was reviewed with the receiving nurse.    Kinder Fall Assessment:    Presents to emergency department  because of falls (Syncope, seizure, or loss of consciousness): No  Age > 70: No  Altered Mental Status, Intoxication with alcohol or substance confusion (Disorientation, impaired judgment, poor safety awaremess, or inability to follow instructions): No  Impaired Mobility: Ambulates or transfers with assistive devices or assistance; Unable to ambulate or transer.: No  Nursing Judgement: No          Lines:   Peripheral IV 08/02/24 Right Antecubital (Active)       Peripheral IV Left Antecubital (Active)        Opportunity for questions and clarification was provided.      Patient transported with:  Monitor and O2 @ 4lpm        "

## 2024-08-02 NOTE — ED Triage Notes (Addendum)
"  SOB, recently dx with onice left upper lobe pna, on 2lts baseline    Per EMS: stayed around 85-88% en route, EKG showed sinus brady    Per patient: dx with pna after bout of hypoxia, SOB, hospital stays  "

## 2024-08-02 NOTE — ED Provider Notes (Signed)
 "[facility] EMERGENCY DEPT  EMERGENCY DEPARTMENT HISTORY AND PHYSICAL EXAM      Date of evaluation: 08/02/2024  Patient Name: Breanna Lester  MRN: [id number]  ED Physician or APP: Josette Barre, MD   Note Started: 6:48 PM EST 08/02/24  Chief Complaint:   Chief Complaint   Patient presents with    Shortness of Breath    Cough       HISTORY OF PRESENT ILLNESS   History Provided By: Patient, EMS     HPI: Breanna Lester is a 63 y.o. female with PMHx significant for HTN, HLD, HFpEF, COPD on 2 L, atrial fibrillation on Eliquis , DM2, liver cirrhosis, and other chronic conditions who presents due to shortness of breath since yesterday. She reports fever, chills, nausea, vomiting, and diarrhea beginning yesterday. She denies increased cough, sputum production, chest pain, abdominal pain, or ENT symptoms. She normally uses 2-2.5 liters of supplemental oxygen but increased her own oxygen to 4 liters due to symptoms, and was found hypoxic with an SpO2 around 88%, requiring escalation to 8 liters. She is currently on doxycycline  for chronic pneumonia, prescribed on 07/24/24.  Patient endorses baseline bradycardia (HR 49-58) and she confirms taking a beta blocker (metoprolol ) this morning.  Patient was also recently discharged from the hospital admitted 11/14 - 11/16 for COPD exacerbation, CHF, acute hypoxic respiratory failure, pneumonia.     PAST MEDICAL HISTORY   Past Medical History:  Past Medical History:   Diagnosis Date    Anxiety     Depression     Gastrointestinal disorder     Heart failure (HCC)     Hyperlipidemia     Hypertension     Liver disease 2009    liver failure    Pneumonia        Past Surgical History:  Past Surgical History:   Procedure Laterality Date    ADENOIDECTOMY  1972    BACK SURGERY      cement discs following a fall    CATARACT REMOVAL      CESAREAN SECTION  1986 and 1993    DILATION AND CURETTAGE OF UTERUS  1999    EGD TRANSORAL BIOPSY SINGLE/MULTIPLE  05/17/2009         HYSTERECTOMY (CERVIX STATUS  UNKNOWN)  2008    TONSILLECTOMY  1972    UPPER GASTROINTESTINAL ENDOSCOPY N/A 04/17/2024    ESOPHAGOGASTRODUODENOSCOPY performed by Madlyn Fendt, MD at [facility] ENDOSCOPY       Allergies:  Allergies   Allergen Reactions    Latex Rash       PCP: Unknown, Provider  PHYSICAL EXAM       ED TRIAGE VITALS  BP: (!) 140/51, Temp: 98.3 F (36.8 C), Pulse: 63, Respirations: 19, SpO2: 94 %  Physical Exam  Constitutional:       Appearance: Normal appearance. She is obese. She is ill-appearing.   HENT:      Head: Normocephalic and atraumatic.      Nose: Nose normal.      Mouth/Throat:      Mouth: Mucous membranes are moist.      Pharynx: Oropharynx is clear.   Eyes:      Extraocular Movements: Extraocular movements intact.      Pupils: Pupils are equal, round, and reactive to light.   Cardiovascular:      Rate and Rhythm: Normal rate and regular rhythm.      Pulses: Normal pulses.      Heart sounds: Normal heart sounds.  Pulmonary:      Effort: Tachypnea present.      Comments: Diminished breath sounds to bases bilaterally, coarse breath sounds to upper lung fields  Abdominal:      Palpations: Abdomen is soft.      Tenderness: There is no abdominal tenderness.   Musculoskeletal:         General: No swelling or tenderness. Normal range of motion.      Cervical back: Normal range of motion and neck supple.   Skin:     General: Skin is warm and dry.      Capillary Refill: Capillary refill takes less than 2 seconds.   Neurological:      General: No focal deficit present.      Mental Status: She is alert and oriented to person, place, and time.   Psychiatric:         Mood and Affect: Mood normal.        SCREENINGS     NIH Stroke Scale  NIH Stroke Scale Assessed: No          No data recorded     LAB, EKG AND IMAGING RESULTS   Labs:  Recent Results (from the past 12 hours)   COVID-19 & Influenza Combo    Collection Time: 08/02/24  7:30 PM    Specimen: Nasopharynx   Result Value Ref Range    SARS-CoV-2, PCR Not Detected Not Detected       Rapid Influenza A By PCR Not Detected Not Detected      Rapid Influenza B By PCR Not Detected Not Detected     Venous Blood Gas, POC    Collection Time: 08/02/24  8:05 PM   Result Value Ref Range    PH, VENOUS (POC) 7.31 7.23 - 7.44      PCO2, Venus, POC 65.4 (H) 41.0 - 52.0 mmHg    PO2, VENOUS (POC) 32 25 - 40 mmHg    HCO3, Venous 32.9 (H) 22.0 - 26.0 mmol/L    SO2, VENOUS (POC) 53.3 %    BASE EXCESS, VENOUS (POC) 5.1 mmol/L    Specimen type: Venous      Performed by: Baltazar Nest    Urinalysis with Reflex to Culture    Collection Time: 08/02/24  9:50 PM    Specimen: Urine   Result Value Ref Range    Color, UA Straw      Appearance Clear Clear      Specific Gravity, UA 1.015 1.003 - 1.030      pH, Urine 5.0 5.0 - 8.0      Protein, UA Negative Negative mg/dL    Glucose, Ur 50 (A) Negative mg/dL    Ketones, Urine Negative Negative mg/dL    Bilirubin, Urine Negative Negative      Blood, Urine Small (A) Negative      Urobilinogen, Urine 0.1 (L) 0.2 - 1.0 EU/dL    Nitrite, Urine Negative Negative      Leukocyte Esterase, Urine Small (A) Negative      WBC, UA 5-10 0 - 4 /hpf    RBC, UA 5-10 0 - 5 /hpf    Epithelial Cells, UA Few Few /lpf    BACTERIA, URINE 1+ (A) Negative /hpf    Urine Culture if Indicated Culture not indicated by UA result Culture not indicated by UA result     POCT Glucose    Collection Time: 08/03/24  3:35 AM   Result Value Ref Range    POC Glucose  302 (H) 65 - 117 mg/dL    Performed by: Melissa Plovan        EKG:.EKG interpreted by me. Shows Sinus Bradycardia with a HR of 56 bpm, PACs,. No STEMI.    Radiologic Studies:  Images preliminarily interpreted by the ED Provider with the following findings: CT Interpreted by me.  Shows worsening pleural effusions with atelectasis to multiple loops concerning for pneumonia, no obvious PE.     Radiologist Interpretation at the time of this note:   CTA CHEST W WO CONTRAST   Final Result   Bilateral pleural effusions are now noted, right greater than left.  Bilateral   lower lobe, right middle lobe, and lingular atelectasis is evident. No evidence   of pulmonary embolism.         Electronically signed by DARICE COLON           Records Reviewed: See ED Course    MEDICAL DECISION MAKING and ED COURSE   7:23 AM Differential and Considerations of tests not ordered: Breanna Lester is a 63 y.o. female with PMHx significant for HTN, HLD, HFpEF, COPD on 2 L, atrial fibrillation on Eliquis , DM2, liver cirrhosis, and other chronic conditions who presents due to shortness of breath since yesterday.  On arrival patient is hemodynamically stable, bradycardic maintaining O2 sat in the low 90s on on 8 L nasal cannula.  Patient was recently seen and diagnosed with pneumonia discharged on doxycycline .  Given new oxygen requirement concerning for readmission.  COPD exacerbation, ACS, PE, dissection, worsening volume overload, heart failure exacerbation, anemia, electrolyte disturbance, acute hypoxic respiratory failure.  CTA chest demonstrating worsening pleural effusions, With atelectasis to middle and lower lobes patient given serial DuoNebs, Solu-Medrol .  Patient given empiric antibiotics, Toradol  and lidocaine  patch for pain as well as Lasix  for volume overload.  Given need for admission with oxygen requirement patient require [facility], will proceed with transfer for admission.     ED Vitals:    Vitals:    08/02/24 2000 08/02/24 2100 08/02/24 2200 08/02/24 2300   BP: 114/84 102/73 (!) 154/70 (!) 140/51   Pulse: 64 76 68 63   Resp: 18 18 16 19    Temp:   98.2 F (36.8 C) 98.3 F (36.8 C)   TempSrc:   Oral Oral   SpO2: 96% 97% 94% 94%   Weight:       Height:           Medications Given:   Medications   ipratropium 0.5 mg-albuterol  2.5 mg (DUONEB ) nebulizer solution 1 Dose (1 Dose Inhalation Not Given 08/02/24 2023)   methylPREDNISolone  sodium succ (SOLU-MEDROL ) injection 80 mg (80 mg IntraVENous Given 08/02/24 1833)   iopamidol  (ISOVUE -370) 76 % injection 100 mL (100 mLs  IntraVENous Given 08/02/24 1907)   ketorolac  (TORADOL ) injection 30 mg (30 mg IntraVENous Given 08/02/24 1833)   ipratropium 0.5 mg-albuterol  2.5 mg (DUONEB ) nebulizer solution 1 Dose (1 Dose Nebulization Given 08/02/24 1916)   cefTRIAXone  (ROCEPHIN ) 2,000 mg in sterile water  20 mL IV syringe (2,000 mg IntraVENous Given 08/02/24 1935)   ipratropium 0.5 mg-albuterol  2.5 mg (DUONEB ) nebulizer solution 1 Dose (1 Dose Nebulization Given 08/02/24 2009)   furosemide  (LASIX ) injection 60 mg (60 mg IntraVENous Given 08/02/24 2010)       ED COURSE  ED Course as of 08/03/24 0723   Sat Aug 02, 2024   1927 No resp distress.  On DuoNeb  treatment with a saturation of 96% on 6 L.  Alert orient  x 4.  Patient request to be transferred to [facility] for admission [SK]   1927 Waiting a response from the hospitalist from [facility] [SK]   8041 Discussed with the hospitalist at [facility] by Dr Leontine Krebs [SK]   2247 Discussed with [facility] ER attending Dr. Olita for ER to ER transfer pending bed assignment at [facility] [SK]   2314 Patient was transport to [facility] by EMT in stable condition [SK]      ED Course User Index  [SK] Luke Montey SQUIBB, MD       Clinical Management Tools: Not Applicable    Smoking Cessation: Not Applicable    CONSULTS: See ED Course/MDM for further details.  None     PROCEDURES   See ED course/ MDM for any additional procedures.  Procedures      SEPSIS REASSESSMENT & CRITICAL CARE TIME   SEPSIS REASSESSMENT: After ED workup at 7:22 AM EST on 08/03/24, while patient initially met SIRS criteria, determined NOT to meet Sepsis criteria    Critical Care: Critical Care Time: There was a high probability of life-threatening clinical deterioration in the patient's condition requiring my urgent intervention.  I personally saw the patient and independently provided 60 minutes of non-concurrent critical care out of the total shared critical care time provided, excluding separately reportable procedures.    CLINICAL IMPRESSIONS     1. COPD exacerbation (HCC)    2. Hypoxemia    3. Congestive heart failure, unspecified HF chronicity, unspecified heart failure type (HCC)    4. Elevated troponin       SDOH/DISPOSITION/PLAN   Social Determinants affecting Treatment Plan: None    DISPOSITION Decision To Transfer 08/02/2024 08:00:26 PM   DISPOSITION CONDITION Stable        Transfer: The patient is being transferred to [facility]. The results of their tests and reasons for their transfer have been discussed with the patient and/or available family. The patient/family has conveyed agreement and understanding for the need to be transferred and for their diagnosis. Consultation has been made with Dr. Dhillon Nyugen, who agrees to accept the transfer.      I am the Primary Clinician of Record. Josette Barre, MD (electronically signed)    (Please note that parts of this dictation were completed with voice recognition software. Quite often unanticipated grammatical, syntax, homophones, and other interpretive errors are inadvertently transcribed by the computer software. Please disregards these errors. Please excuse any errors that have escaped final proofreading.)        Barre Josette, MD  08/03/24 [phone]    "

## 2024-08-02 NOTE — ED Notes (Signed)
"  TRANSFER - OUT REPORT:    Verbal report given to [facility] EMT Richard on [name]  being transferred to [facility] ED  for routine progression of patient care       Report consisted of patient's Situation, Background, Assessment and   Recommendations(SBAR).     Information from the following report(s) Nurse Handoff Report, Index, ED Encounter Summary, ED SBAR, Adult Overview, Surgery Report, Intake/Output, MAR, Recent Results, Cardiac Rhythm normal sinus, Quality Measures, Neuro Assessment, and Event Log was reviewed with the receiving nurse.    Kinder Fall Assessment:    Presents to emergency department  because of falls (Syncope, seizure, or loss of consciousness): No  Age > 70: No  Altered Mental Status, Intoxication with alcohol or substance confusion (Disorientation, impaired judgment, poor safety awaremess, or inability to follow instructions): No  Impaired Mobility: Ambulates or transfers with assistive devices or assistance; Unable to ambulate or transer.: No  Nursing Judgement: No          Lines:   Peripheral IV 08/02/24 Right Antecubital (Active)       Peripheral IV Left Antecubital (Active)        Opportunity for questions and clarification was provided.      Patient transported with:  Monitor and O2 @ 3lpm        "

## 2024-08-03 ENCOUNTER — Inpatient Hospital Stay
Admission: EM | Admit: 2024-08-03 | Discharge: 2024-08-03 | Disposition: A | Discharge status: Home / Self Care | Payer: Medicare (Managed Care) | Arrived: AM | Admitting: Family Medicine

## 2024-08-03 DIAGNOSIS — J441 Chronic obstructive pulmonary disease with (acute) exacerbation: Principal | ICD-10-CM

## 2024-08-03 DIAGNOSIS — R0602 Shortness of breath: Secondary | ICD-10-CM

## 2024-08-03 DIAGNOSIS — I110 Hypertensive heart disease with heart failure: Secondary | ICD-10-CM

## 2024-08-03 LAB — COMPREHENSIVE METABOLIC PANEL
ALT: 18 U/L (ref 10–35)
AST: 32 U/L (ref 10–35)
Albumin/Globulin Ratio: 0.9 — ABNORMAL LOW (ref 1.1–2.2)
Albumin: 3 g/dL — ABNORMAL LOW (ref 3.5–5.2)
Alk Phosphatase: 157 U/L — ABNORMAL HIGH (ref 35–104)
Anion Gap: 7 mmol/L (ref 2–14)
BUN/Creatinine Ratio: 30 — ABNORMAL HIGH (ref 12–20)
BUN: 20 mg/dL (ref 8–23)
CO2: 35 mmol/L — ABNORMAL HIGH (ref 20–29)
Calcium: 8.9 mg/dL (ref 8.8–10.2)
Chloride: 100 mmol/L (ref 98–107)
Creatinine: 0.67 mg/dL (ref 0.60–1.00)
Est, Glom Filt Rate: >90 ml/min/1.73m2 (ref >59)
Globulin: 3.4 g/dL (ref 2.0–4.0)
Glucose: 161 mg/dL — ABNORMAL HIGH (ref 65–100)
Potassium: 4.3 mmol/L (ref 3.5–5.1)
Sodium: 142 mmol/L (ref 136–145)
Total Bilirubin: 0.8 mg/dL (ref 0.0–1.2)
Total Protein: 6.3 g/dL — ABNORMAL LOW (ref 6.4–8.3)

## 2024-08-03 LAB — BASIC METABOLIC PANEL
Anion Gap: 9 mmol/L (ref 2–14)
BUN/Creatinine Ratio: 21 — ABNORMAL HIGH (ref 12–20)
BUN: 17 mg/dL (ref 8–23)
CO2: 35 mmol/L — ABNORMAL HIGH (ref 20–29)
Calcium: 8.8 mg/dL (ref 8.8–10.2)
Chloride: 97 mmol/L — ABNORMAL LOW (ref 98–107)
Creatinine: 0.81 mg/dL (ref 0.60–1.00)
Est, Glom Filt Rate: 82 ml/min/1.73m2 (ref >59)
Glucose: 311 mg/dL — ABNORMAL HIGH (ref 65–100)
Potassium: 4.5 mmol/L (ref 3.5–5.1)
Sodium: 141 mmol/L (ref 136–145)

## 2024-08-03 LAB — MAGNESIUM: Magnesium: 2 mg/dL (ref 1.6–2.4)

## 2024-08-03 LAB — VENOUS BLOOD GAS, POINT OF CARE
BASE EXCESS, VENOUS (POC): 5.1 mmol/L
HCO3, Venous: 32.9 mmol/L — ABNORMAL HIGH (ref 22.0–26.0)
PCO2, Venus, POC: 65.4 mmHg — ABNORMAL HIGH (ref 41.0–52.0)
PH, VENOUS (POC): 7.31 (ref 7.23–7.44)
PO2, VENOUS (POC): 32 mmHg (ref 25–40)
SO2, VENOUS (POC): 53.3 %

## 2024-08-03 LAB — URINALYSIS WITH REFLEX TO CULTURE
Bilirubin, Urine: NEGATIVE
Glucose, Ur: 50 mg/dL — AB
Ketones, Urine: NEGATIVE mg/dL
Nitrite, Urine: NEGATIVE
Protein, UA: NEGATIVE mg/dL
Specific Gravity, UA: 1.015 (ref 1.003–1.030)
Urobilinogen, Urine: 0.1 EU/dL — ABNORMAL LOW (ref 0.2–1.0)
pH, Urine: 5 (ref 5.0–8.0)

## 2024-08-03 LAB — LIPASE: Lipase: 16 U/L (ref 13–60)

## 2024-08-03 LAB — POCT GLUCOSE
POC Glucose: 302 mg/dL — ABNORMAL HIGH (ref 65–117)
POC Glucose: 268 mg/dL — ABNORMAL HIGH (ref 65–117)
POC Glucose: 272 mg/dL — ABNORMAL HIGH (ref 65–117)
POC Glucose: 250 mg/dL — ABNORMAL HIGH (ref 65–117)

## 2024-08-03 LAB — T4, FREE: T4 Free: 1.1 ng/dL (ref 0.9–1.6)

## 2024-08-03 LAB — TSH: TSH, 3rd Generation: 0.691 u[IU]/mL (ref 0.270–4.200)

## 2024-08-03 LAB — COVID-19 & INFLUENZA COMBO
Rapid Influenza A By PCR: NOT DETECTED
Rapid Influenza B By PCR: NOT DETECTED
SARS-CoV-2, PCR: NOT DETECTED

## 2024-08-03 LAB — PROCALCITONIN: Procalcitonin: 0.07 ng/mL

## 2024-08-03 LAB — BRAIN NATRIURETIC PEPTIDE: NT Pro-BNP: 1495 pg/mL — ABNORMAL HIGH (ref 0–125)

## 2024-08-03 MED ORDER — POTASSIUM BICARB-CITRIC ACID 20 MEQ PO TBEF
20 | ORAL | Status: DC | PRN
Start: 2024-08-03 — End: 2024-08-06

## 2024-08-03 MED ORDER — NORMAL SALINE FLUSH 0.9 % IV SOLN
0.9 | Freq: Two times a day (BID) | INTRAVENOUS | Status: DC
Start: 2024-08-03 — End: 2024-08-06
  Administered 2024-08-03 – 2024-08-06 (×6): 10 mL via INTRAVENOUS

## 2024-08-03 MED ORDER — INSULIN GLARGINE 100 UNIT/ML SC SOLN
100 | Freq: Every day | SUBCUTANEOUS | Status: DC
Start: 2024-08-03 — End: 2024-08-06
  Administered 2024-08-03 – 2024-08-06 (×4): 29 [IU]/kg via SUBCUTANEOUS

## 2024-08-03 MED ORDER — GUAIFENESIN ER 600 MG PO TB12
600 | Freq: Two times a day (BID) | ORAL | Status: DC
Start: 2024-08-03 — End: 2024-08-06
  Administered 2024-08-03 – 2024-08-06 (×7): 1200 mg via ORAL

## 2024-08-03 MED ORDER — PANTOPRAZOLE SODIUM 40 MG PO TBEC
40 | Freq: Every day | ORAL | Status: DC
Start: 2024-08-03 — End: 2024-08-06
  Administered 2024-08-03 – 2024-08-06 (×4): 40 mg via ORAL

## 2024-08-03 MED ORDER — POLYETHYLENE GLYCOL 3350 17 G PO PACK
17 | Freq: Every day | ORAL | Status: DC | PRN
Start: 2024-08-03 — End: 2024-08-06

## 2024-08-03 MED ORDER — FUROSEMIDE 10 MG/ML IJ SOLN
10 | Freq: Every day | INTRAMUSCULAR | Status: DC
Start: 2024-08-03 — End: 2024-08-06
  Administered 2024-08-03 – 2024-08-06 (×4): 40 mg via INTRAVENOUS

## 2024-08-03 MED ORDER — STERILE WATER FOR INJECTION (MIXTURES ONLY)
40 | Freq: Three times a day (TID) | INTRAMUSCULAR | Status: DC
Start: 2024-08-03 — End: 2024-08-03
  Administered 2024-08-03 (×2): 40 mg via INTRAVENOUS

## 2024-08-03 MED ORDER — ACETAMINOPHEN 325 MG PO TABS
325 | Freq: Four times a day (QID) | ORAL | Status: DC | PRN
Start: 2024-08-03 — End: 2024-08-06
  Administered 2024-08-04 – 2024-08-05 (×2): 650 mg via ORAL

## 2024-08-03 MED ORDER — NORMAL SALINE FLUSH 0.9 % IV SOLN
0.9 | INTRAVENOUS | Status: DC | PRN
Start: 2024-08-03 — End: 2024-08-06

## 2024-08-03 MED ORDER — IPRATROPIUM-ALBUTEROL 0.5-2.5 (3) MG/3ML IN SOLN
0.5-2.5 | RESPIRATORY_TRACT | Status: AC
Start: 2024-08-03 — End: 2024-08-02
  Administered 2024-08-03: 01:00:00 1 via RESPIRATORY_TRACT

## 2024-08-03 MED ORDER — METHYLPREDNISOLONE SODIUM SUCC 40 MG IJ SOLR
40 | Freq: Two times a day (BID) | INTRAMUSCULAR | Status: DC
Start: 2024-08-03 — End: 2024-08-03

## 2024-08-03 MED ORDER — APIXABAN 5 MG PO TABS
5 | Freq: Two times a day (BID) | ORAL | Status: DC
Start: 2024-08-03 — End: 2024-08-06
  Administered 2024-08-03 – 2024-08-06 (×7): 5 mg via ORAL

## 2024-08-03 MED ORDER — INSULIN LISPRO 100 UNIT/ML IJ SOLN
100 | Freq: Four times a day (QID) | INTRAMUSCULAR | Status: DC
Start: 2024-08-03 — End: 2024-08-06
  Administered 2024-08-03: 23:00:00 4 [IU] via SUBCUTANEOUS
  Administered 2024-08-03: 09:00:00 6 [IU] via SUBCUTANEOUS
  Administered 2024-08-03 (×2): 4 [IU] via SUBCUTANEOUS
  Administered 2024-08-04: 16:00:00 2 [IU] via SUBCUTANEOUS
  Administered 2024-08-04: 02:00:00 4 [IU] via SUBCUTANEOUS
  Administered 2024-08-04 – 2024-08-05 (×2): 2 [IU] via SUBCUTANEOUS
  Administered 2024-08-05 (×2): 4 [IU] via SUBCUTANEOUS
  Administered 2024-08-06: 17:00:00 2 [IU] via SUBCUTANEOUS
  Administered 2024-08-06: 02:00:00 8 [IU] via SUBCUTANEOUS

## 2024-08-03 MED ORDER — MELATONIN 3 MG PO TABS
3 | Freq: Every evening | ORAL | Status: DC | PRN
Start: 2024-08-03 — End: 2024-08-06

## 2024-08-03 MED ORDER — ACETAMINOPHEN 650 MG RE SUPP
650 | Freq: Four times a day (QID) | RECTAL | Status: DC | PRN
Start: 2024-08-03 — End: 2024-08-06

## 2024-08-03 MED ORDER — PROCHLORPERAZINE EDISYLATE 10 MG/2ML IJ SOLN
10 | Freq: Four times a day (QID) | INTRAMUSCULAR | Status: DC | PRN
Start: 2024-08-03 — End: 2024-08-06

## 2024-08-03 MED ORDER — GLUCAGON (RDNA) 1 MG IJ KIT
1 | INTRAMUSCULAR | Status: DC | PRN
Start: 2024-08-03 — End: 2024-08-06

## 2024-08-03 MED ORDER — FUROSEMIDE 10 MG/ML IJ SOLN
10 | Freq: Once | INTRAMUSCULAR | Status: AC
Start: 2024-08-03 — End: 2024-08-02
  Administered 2024-08-03: 01:00:00 60 mg via INTRAVENOUS

## 2024-08-03 MED ORDER — MICONAZOLE NITRATE 2 % EX POWD
2 | Freq: Two times a day (BID) | CUTANEOUS | Status: DC
Start: 2024-08-03 — End: 2024-08-06
  Administered 2024-08-03 – 2024-08-06 (×7): via TOPICAL

## 2024-08-03 MED ORDER — BUDESONIDE 0.5 MG/2ML IN SUSP
0.5 | Freq: Two times a day (BID) | RESPIRATORY_TRACT | Status: DC
Start: 2024-08-03 — End: 2024-08-06
  Administered 2024-08-03 – 2024-08-06 (×4): 500 mg via RESPIRATORY_TRACT

## 2024-08-03 MED ORDER — SERTRALINE HCL 50 MG PO TABS
50 | Freq: Every day | ORAL | Status: DC
Start: 2024-08-03 — End: 2024-08-06
  Administered 2024-08-04 – 2024-08-06 (×3): 100 mg via ORAL

## 2024-08-03 MED ORDER — DOXYCYCLINE HYCLATE 100 MG IV SOLR
100 | Freq: Two times a day (BID) | INTRAVENOUS | Status: DC
Start: 2024-08-03 — End: 2024-08-05
  Administered 2024-08-03 – 2024-08-05 (×5): 100 mg via INTRAVENOUS

## 2024-08-03 MED ORDER — CEFTRIAXONE SODIUM 2 G IJ SOLR
2 | INTRAMUSCULAR | Status: DC
Start: 2024-08-03 — End: 2024-08-04
  Administered 2024-08-04: 02:00:00 2000 mg via INTRAVENOUS

## 2024-08-03 MED ORDER — POTASSIUM CHLORIDE 10 MEQ/100ML IV SOLN
10 | INTRAVENOUS | Status: DC | PRN
Start: 2024-08-03 — End: 2024-08-06

## 2024-08-03 MED ORDER — DEXTROSE 10 % IV SOLN
10 | INTRAVENOUS | Status: DC | PRN
Start: 2024-08-03 — End: 2024-08-06

## 2024-08-03 MED ORDER — DEXTROSE 10 % IV BOLUS
INTRAVENOUS | Status: DC | PRN
Start: 2024-08-03 — End: 2024-08-06

## 2024-08-03 MED ORDER — ARFORMOTEROL TARTRATE 15 MCG/2ML IN NEBU
15 | Freq: Two times a day (BID) | RESPIRATORY_TRACT | Status: DC
Start: 2024-08-03 — End: 2024-08-06
  Administered 2024-08-03 – 2024-08-06 (×4): 15 ug via RESPIRATORY_TRACT

## 2024-08-03 MED ORDER — POTASSIUM CHLORIDE ER 10 MEQ PO TBCR
10 | ORAL | Status: DC | PRN
Start: 2024-08-03 — End: 2024-08-06

## 2024-08-03 MED ORDER — ENOXAPARIN SODIUM 30 MG/0.3ML IJ SOSY
30 | Freq: Two times a day (BID) | INTRAMUSCULAR | Status: DC
Start: 2024-08-03 — End: 2024-08-03

## 2024-08-03 MED ORDER — SODIUM CHLORIDE 0.9 % IV SOLN
0.9 | INTRAVENOUS | Status: DC | PRN
Start: 2024-08-03 — End: 2024-08-06

## 2024-08-03 MED ORDER — IPRATROPIUM-ALBUTEROL 0.5-2.5 (3) MG/3ML IN SOLN
0.5-2.5 | Freq: Four times a day (QID) | RESPIRATORY_TRACT | Status: DC
Start: 2024-08-03 — End: 2024-08-04
  Administered 2024-08-03 – 2024-08-04 (×4): 1 via RESPIRATORY_TRACT

## 2024-08-03 MED ORDER — CEFTRIAXONE SODIUM 2 G IJ SOLR
2 | INTRAMUSCULAR | Status: AC
Start: 2024-08-03 — End: 2024-08-02
  Administered 2024-08-03: 01:00:00 2000 mg via INTRAVENOUS

## 2024-08-03 MED ORDER — GLUCOSE 4 G PO CHEW
4 | ORAL | Status: DC | PRN
Start: 2024-08-03 — End: 2024-08-06

## 2024-08-03 MED ORDER — METHYLPREDNISOLONE SODIUM SUCC 125 MG IJ SOLR
125 | Freq: Two times a day (BID) | INTRAMUSCULAR | Status: DC
Start: 2024-08-03 — End: 2024-08-06
  Administered 2024-08-04 – 2024-08-06 (×5): 60 mg via INTRAVENOUS

## 2024-08-03 MED ORDER — MAGNESIUM SULFATE 2000 MG/50 ML IVPB PREMIX
2 | INTRAVENOUS | Status: DC | PRN
Start: 2024-08-03 — End: 2024-08-06

## 2024-08-03 MED ORDER — FUROSEMIDE 10 MG/ML IJ SOLN
10 | Freq: Once | INTRAMUSCULAR | Status: DC
Start: 2024-08-03 — End: 2024-08-06

## 2024-08-03 MED ORDER — IPRATROPIUM-ALBUTEROL 0.5-2.5 (3) MG/3ML IN SOLN
0.5-2.5 | RESPIRATORY_TRACT | Status: AC
Start: 2024-08-03 — End: 2024-08-02
  Administered 2024-08-03: 1 via RESPIRATORY_TRACT

## 2024-08-03 MED ORDER — DICLOFENAC SODIUM 1 % EX GEL
1 | Freq: Four times a day (QID) | CUTANEOUS | Status: DC | PRN
Start: 2024-08-03 — End: 2024-08-06

## 2024-08-03 MED FILL — INSULIN LISPRO 100 UNIT/ML IJ SOLN: 100 [IU]/mL | INTRAMUSCULAR | Qty: 4

## 2024-08-03 MED FILL — ELIQUIS 5 MG PO TABS: 5 mg | ORAL | Qty: 1

## 2024-08-03 MED FILL — GUAIFENESIN ER 600 MG PO TB12: 600 mg | ORAL | Qty: 2

## 2024-08-03 MED FILL — DICLOFENAC SODIUM 1 % EX GEL: 1 % | CUTANEOUS | Qty: 100

## 2024-08-03 MED FILL — DOXYCYCLINE HYCLATE 100 MG IV SOLR: 100 mg | INTRAVENOUS | Qty: 100

## 2024-08-03 MED FILL — IPRATROPIUM-ALBUTEROL 0.5-2.5 (3) MG/3ML IN SOLN: 0.5-2.5 (3) MG/3ML | RESPIRATORY_TRACT | Qty: 3 | Fill #0

## 2024-08-03 MED FILL — INSULIN LISPRO 100 UNIT/ML IJ SOLN: 100 [IU]/mL | INTRAMUSCULAR | Qty: 6

## 2024-08-03 MED FILL — REMEDY PHYTOPLEX ANTIFUNGAL 2 % EX POWD: 2 % | CUTANEOUS | Qty: 85

## 2024-08-03 MED FILL — CEFTRIAXONE SODIUM 2 G IJ SOLR: 2 g | INTRAMUSCULAR | Qty: 2000 | Fill #0

## 2024-08-03 MED FILL — METHYLPREDNISOLONE SODIUM SUCC 40 MG IJ SOLR: 40 mg | INTRAMUSCULAR | Qty: 40

## 2024-08-03 MED FILL — FUROSEMIDE 10 MG/ML IJ SOLN: 10 mg/mL | INTRAMUSCULAR | Qty: 4

## 2024-08-03 MED FILL — PANTOPRAZOLE SODIUM 40 MG PO TBEC: 40 mg | ORAL | Qty: 1

## 2024-08-03 MED FILL — DEXTROSE 10 % IV SOLN: 10 % | INTRAVENOUS | Qty: 500

## 2024-08-03 MED FILL — LANTUS 100 UNIT/ML SC SOLN: 100 [IU]/mL | SUBCUTANEOUS | Qty: 29

## 2024-08-03 MED FILL — BD POSIFLUSH 0.9 % IV SOLN: 0.9 % | INTRAVENOUS | Qty: 40

## 2024-08-03 MED FILL — FUROSEMIDE 10 MG/ML IJ SOLN: 10 mg/mL | INTRAMUSCULAR | Qty: 10 | Fill #0

## 2024-08-03 NOTE — H&P (Signed)
 "  Hospitalist Admission Note    NAME: Breanna Lester   DOB:  March 14, 1961   MRN:  [id number]     Date/Time:  08/03/2024 1:58 AM    Patient PCP: Unknown, Provider    Please note that this dictation was completed with Dragon, the computer voice recognition software.  Quite often unanticipated grammatical, syntax, homophones, and other interpretive errors are inadvertently transcribed by the computer software.  Please disregard these errors.  Please excuse any errors that have escaped final proofreading  ________________________________________________________________________    Given the patient's current clinical presentation, I have a high level of concern for decompensation if discharged from the emergency department.  Complex decision making was performed, which includes reviewing the patient's available past medical records, laboratory results, and x-ray films.       My assessment of this patient's clinical condition and my plan of care is as follows.    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 Afib, taken off eliquis  6 weeks ago at [facility] per patient.  Note from cards at [facility] 9/25 okay to anticoagulate.  No bleeding  --restart eliquis  as CHADSVASC = 4    Anxiety/depression  --cont zoloft     Obesity  Body mass index is 47.18 kg/m.      Social determinants of health:  disjointed care, no pcp, homeless.  Case management consult, ansible referral    Medical Decision Making:   I have personally reviewed the radiographs, laboratory data in Epic and decisions and statements above are based partially on this personal  interpretation.    Code Status: Full Code  DVT Prophylaxis: Lovenox   GI Prophylaxis: PPI         Subjective:   CHIEF COMPLAINT: SOB    HISTORY OF PRESENT ILLNESS:     Breanna Lester is a 64 y.o.  female with PMHx copd on 2L O2 at home x 4 months, heart failure preserved EF 70 to 75%, history of moderate pericardial effusion 1 to 2 cm, diabetes (although she denies this), HTN, hyperlipidemia, p. Afib (reported taken off eliquis  6 weeks ago at [facility]), liver cirrhosis (due to alcohol by chart; but patient says unknown etiology)  presented with shortness of breath and cough. She was transferred from [facility] ED for further management.    Initial evaluation in the ED revealed oxygen requirement of 6 L/min to maintain SpO? between 93-99 %. VBG showed pH 7.37, pCO? 57 mm Hg, pO? 43 mm Hg, and bicarbonate 33 mmol/L; serum bicarbonate was 35 mmol/L. ProBNP was 1495 pg/mL, and troponin was chronically elevated but stable at 16.1 ng/L.     CTA chest demonstrated bilateral pleural effusions (right greater than left), bilateral lower lobe, right middle lobe, and lingular atelectasis without evidence of pulmonary embolism. EKG revealed sinus bradycardia (rate 56), aberrant premature atrial complexes, prolonged QT interval (480 ms), and nonspecific T wave abnormality.    The Emergency department physician requested admission for management of COPD exacerbation and community-acquired pneumonia, with ongoing oxygen therapy    Hx of intubation 10/25 due to  pna, copd exac.  She stopped taking Bumex .  Is not on any maintenance inhaler, only albuterol  inhaler and albuterol  neb.  She is disabled, homeless for the past 3 years, living in [address] in [place] with her daughter.  Does not have primary care doctor, pulmonologist or cardiologist.    Seen at outside ER on December 4 and diagnosed with COPD exacerbation.  Prescribed doxycycline  for 7 days which she is near the end of the course.    Shortness of breath began 2  days ago and worsened yesterday to the point that she was unable to get up to the bathroom.  She increased home O2 from 2 to 4 L.  Positive wheezing.  Denies any cough or chest pain.  Noticed some swelling in the right hand and foot.    Available records were reviewed at the time of H&P.        Past Medical History:   Diagnosis Date    Anxiety     Depression     Gastrointestinal disorder     Heart failure (HCC)     Hyperlipidemia     Hypertension     Liver disease 2009    liver failure    Pneumonia       Past Surgical History:   Procedure Laterality Date    ADENOIDECTOMY  1972    BACK SURGERY      cement discs following a fall    CATARACT REMOVAL      CESAREAN SECTION  1986 and 1993    DILATION AND CURETTAGE OF UTERUS  1999    EGD TRANSORAL BIOPSY SINGLE/MULTIPLE  05/17/2009         HYSTERECTOMY (CERVIX STATUS UNKNOWN)  2008    TONSILLECTOMY  1972    UPPER GASTROINTESTINAL ENDOSCOPY N/A 04/17/2024    ESOPHAGOGASTRODUODENOSCOPY performed by Madlyn Fendt, MD at [facility] ENDOSCOPY     Social History     Tobacco Use    Smoking status: Former     Current packs/day: 0.00     Types: Cigarettes     Quit date: 02/28/2008     Years since quitting: 16.4    Smokeless tobacco: Never   Substance Use Topics    Alcohol use: Never      Family History   Problem Relation Age of Onset    Cancer Mother     Heart Disease Father         Allergies   Allergen Reactions    Latex Rash      Currently only taking: Zoloft  100 mg, metoprolol  50 mg twice daily, doxycycline  100 mg twice daily, albuterol  inhaler and albuterol  neb  Prior to Admission medications   Medication Sig Start Date End Date Taking? Authorizing Provider   doxycycline  hyclate (VIBRAMYCIN ) 100 MG capsule Take 1 capsule by mouth 2 times daily   Yes [provider]   predniSONE  (DELTASONE ) 20 MG tablet Take 1 tablet by mouth daily   Yes [provider]   albuterol  (PROVENTIL ) (2.5 MG/3ML) 0.083% nebulizer solution Take 3 mLs by nebulization 2 times daily   Yes [provider]   sertraline  (ZOLOFT ) 100 MG tablet Take 1 tablet by mouth daily 07/24/24  Yes Roselyn Carlin Charleston, MD   metoprolol  tartrate (LOPRESSOR ) 50 MG tablet Take 1 tablet by mouth 2 times daily 06/12/24  Yes Gillard, Madison A, PA-C   albuterol  sulfate HFA (VENTOLIN  HFA) 108 (90 Base) MCG/ACT inhaler Inhale 2 puffs into the lungs 4 times daily as  needed for Wheezing 04/11/24 08/03/24 Yes Bertell Bathe, MD   albuterol  sulfate HFA (VENTOLIN  HFA) 108 (90 Base) MCG/ACT inhaler Inhale 2 puffs into the lungs 4 times daily as needed for Wheezing 07/24/24   Roselyn Carlin Charleston, MD   atorvastatin  (LIPITOR ) 40 MG tablet Take 1 tablet by mouth nightly  Patient not taking: Reported on 08/02/2024 06/12/24   Ronn Dixon A, PA-C   OLANZapine  (ZYPREXA ) 10 MG tablet Take 1 tablet by mouth nightly  Patient not taking: Reported on 08/02/2024 06/12/24   Ronn Dixon A, PA-C   bumetanide  (BUMEX ) 1 MG tablet Take 1 tablet by mouth daily 06/12/24   Gillard, Madison A, PA-C   metFORMIN  (GLUCOPHAGE -XR) 500 MG extended release tablet Take 1 tablet by mouth daily (with breakfast)  Patient not taking: Reported on 07/04/2024    [provider]   apixaban  (ELIQUIS ) 5 MG TABS tablet Take 1 tablet by mouth 2 times daily  Patient not taking: Reported on 07/04/2024 05/09/24   Karenann Barter, MD   guaiFENesin  (MUCINEX ) 600 MG extended release tablet Take 1 tablet by mouth 2 times daily 05/09/24   Karenann Barter, MD   pantoprazole  (PROTONIX ) 40 MG tablet Take 1 tablet by mouth 2 times daily (before meals)  Patient taking differently: Take 1 tablet by mouth daily 05/09/24   Karenann Barter, MD   Melatonin 10 MG TABS Take 1 tablet by mouth at bedtime    [provider]   lactobacillus (CULTURELLE) capsule Take 1 capsule by mouth daily (with breakfast) 04/26/24   Bowlin, Amber L, APRN - CNP   Blood Glucose Monitoring Suppl (BLOOD GLUCOSE MONITOR SYSTEM) w/Device KIT Pharmacist to identify preferred meter and strips. 04/11/24    Smithson, Olivia LABOR, APRN - CNS   blood glucose monitor strips Test 1-2 times a day & as needed for symptoms of irregular blood glucose. Dispense sufficient amount for indicated testing frequency plus additional to accommodate PRN testing needs. Pharmacist to identify preferred brand. 04/11/24   Smithson, Olivia LABOR, APRN - CNS   Lancets 30G MISC Test 1-2 times a day & as needed for symptoms of irregular blood glucose. Dispense sufficient amount for indicated testing frequency plus additional to accommodate PRN testing needs. Pharmacist to identify preferred brand. 04/11/24   Smithson, Olivia LABOR, APRN - CNS   acetaminophen  (TYLENOL ) 325 MG tablet Take 1 tablet by mouth every 6 hours as needed for Pain 02/26/24 03/07/24  Cutchins, Morene DASEN, PA-C       REVIEW OF SYSTEMS:  See HPI for details   POSITIVE= Bold.  Negative = normal text  General:  fever, chills, sweats, generalized weakness, weight loss/gain, loss of appetite  Eyes:  blurred vision, eye pain, loss of vision, diplopia  Ear Nose and Throat:  rhinorrhea, pharyngitis  Respiratory:   cough, sputum production, SOB, wheezing, DOE, pleuritic pain  Cardiology:  chest pain, palpitations, orthopnea, PND, edema, syncope   Gastrointestinal:  abdominal pain, N/V, dysphagia, diarrhea, constipation, bleeding  Genitourinary:  frequency, urgency, dysuria, hematuria, incontinence  Muskuloskeletal :  arthralgia, myalgia  Hematology:  easy bruising, bleeding, lymphadenopathy  Dermatological:  rash, ulceration, pruritis  Endocrine:  hot flashes or polydipsia  Neurological:  headache, dizziness, confusion, focal weakness, paresthesia, memory loss, gait disturbance  Psychological: anxiety, depression, agitation      Objective:   VITALS:    Vitals:    08/03/24 0045   BP: 136/82   Pulse: 66   Resp: 19   Temp:    SpO2: 95%  Patient Vitals for the past 24 hrs:   BP Temp Temp src Pulse Resp SpO2 Height Weight   08/03/24 0045 136/82 -- -- 66 19 95 % 1.575 m (5' 2) 117 kg (257 lb 15  oz)   08/03/24 0035 (!) 153/57 98.5 F (36.9 C) Oral 66 13 94 % -- --       Temp (24hrs), Avg:98.1 F (36.7 C), Min:97.4 F (36.3 C), Max:98.5 F (36.9 C)           Wt Readings from Last 12 Encounters:   08/03/24 117 kg (257 lb 15 oz)   08/02/24 122.5 kg (270 lb)   07/24/24 122.5 kg (270 lb)   07/06/24 114 kg (251 lb 5.2 oz)   06/16/24 116.5 kg (256 lb 13.4 oz)   06/06/24 126.6 kg (279 lb 1.6 oz)   05/03/24 129.7 kg (286 lb)   05/05/24 129.7 kg (286 lb)   04/25/24 125 kg (275 lb 9.6 oz)   04/17/24 122.5 kg (270 lb)   04/08/24 124.3 kg (274 lb)   02/26/24 113.4 kg (250 lb)     Body mass index is 47.18 kg/m.    PHYSICAL EXAM:    Gen: Obese, dyspneic speaking 4-5 wordsWell-developed, well-nourished  HEENT:  Pink conjunctivae, PERRL, hearing intact to voice, moist mucous membranes  Neck: Supple, without masses, thyroid non-tender  Resp: Decreased BS b/l, b/l crackles in bases, No accessory muscle use, clear breath sounds without wheezes   Card: No murmurs, normal S1, S2 without thrills, bruits or peripheral edema  Abd:  Obese, lemon size umbilical hernia, non-tender, non-distended, normoactive bowel sounds are present, no palpable organomegaly   Lymph:  No cervical or inguinal adenopathy  Musc: No cyanosis or clubbing  Skin: No rashes or ulcers, skin turgor is good  Neuro:  Cranial nerves are grossly intact, no focal motor weakness, follows commands appropriately, no asterixis  Psych:  Good insight, oriented to person, place and time, alert    _______________________________________________________________________  Care Plan discussed with:    Comments   Patient y Discussed with patient in room. POC outlined and Questions answered    Family      RN x    Care Manager                    Consultant:  x ED MD   _______________________________________________________________________  Recommended Disposition:   Home with Family y   HH/PT/OT/RN    SNF/LTC    SA[facility]     ________________________________________________________________________  TOTAL TIME:  75 Minutes        Comments   >50% of visit spent in counseling and coordination of care  Chart reviewed  Discussion with patient and/or family and questions answered     LAB DATA REVIEWED:    Recent Results (from the past 12 hours)   EKG 12 Lead (SOB)    Collection Time: 08/02/24  6:08 PM   Result Value Ref Range    Ventricular Rate 56 BPM    Atrial Rate 56 BPM    P-R Interval 142 ms    QRS Duration 88 ms    Q-T Interval 498 ms    QTc Calculation (Bazett) 480 ms    P Axis 48 degrees    R Axis 80 degrees    T Axis 88 degrees    Diagnosis       Sinus bradycardia with Premature atrial complexes with Abberant conduction  Nonspecific T wave abnormality  Prolonged QT  Abnormal  ECG  When compared with ECG of 24-Jul-2024 17:02,  No significant change was found  Confirmed by DAVE MD, KAMLESH ([id number]) on 08/02/2024 6:37:22 PM     POC Lactic Acid    Collection Time: 08/02/24  6:32 PM   Result Value Ref Range    POC Lactic Acid 1.99 0.40 - 2.00 mmol/L    Performed by: Weyman Callander    Brain Natriuretic Peptide    Collection Time: 08/02/24  6:36 PM   Result Value Ref Range    NT Pro-BNP 1,495 (H) 0 - 125 pg/mL   CBC with Auto Differential    Collection Time: 08/02/24  6:36 PM   Result Value Ref Range    WBC 6.2 3.6 - 11.0 K/uL    RBC 3.60 (L) 3.80 - 5.20 M/uL    Hemoglobin 9.1 (L) 11.5 - 16.0 g/dL    Hematocrit 66.7 (L) 35.0 - 47.0 %    MCV 92.2 80.0 - 99.0 FL    MCH 25.3 (L) 26.0 - 34.0 PG    MCHC 27.4 (L) 30.0 - 36.5 g/dL    RDW 83.4 (H) 88.4 - 14.5 %    Platelets 121 (L) 150 - 400 K/uL    MPV 12.6 8.9 - 12.9 FL    Neutrophils % 71.6 32.0 - 75.0 %    Lymphocytes % 20.4 12.0 - 49.0 %    Monocytes % 6.3 5.0 - 13.0 %    Eosinophils % 1.0 0.0 - 7.0 %    Basophils % 0.5 0.0 - 1.0 %    Immature Granulocytes % 0.2 0.0 - 0.5 %    Neutrophils Absolute 4.46 1.80 - 8.00 K/UL    Lymphocytes Absolute 1.27 0.80 - 3.50 K/UL    Monocytes Absolute 0.39 0.00 -  1.00 K/UL    Eosinophils Absolute 0.06 0.00 - 0.40 K/UL    Basophils Absolute 0.03 0.00 - 0.10 K/UL    Immature Granulocytes Absolute 0.01 0.00 - 0.04 K/UL    Differential Type AUTOMATED     Comprehensive Metabolic Panel    Collection Time: 08/02/24  6:36 PM   Result Value Ref Range    Sodium 142 136 - 145 mmol/L    Potassium 4.3 3.5 - 5.1 mmol/L    Chloride 100 98 - 107 mmol/L    CO2 35 (H) 20 - 29 mmol/L    Anion Gap 7 2 - 14 mmol/L    Glucose 161 (H) 65 - 100 mg/dL    BUN 20 8 - 23 mg/dL    Creatinine 9.32 9.39 - 1.00 mg/dL    BUN/Creatinine Ratio 30 (H) 12 - 20      Est, Glom Filt Rate >90 >59 ml/min/1.85m2    Calcium  8.9 8.8 - 10.2 mg/dL    Total Bilirubin 0.8 0.0 - 1.2 mg/dL    AST 32 10 - 35 U/L    ALT 18 10 - 35 U/L    Alk Phosphatase 157 (H) 35 - 104 U/L    Total Protein 6.3 (L) 6.4 - 8.3 g/dL    Albumin 3.0 (L) 3.5 - 5.2 g/dL    Globulin 3.4 2.0 - 4.0 g/dL    Albumin/Globulin Ratio 0.9 (L) 1.1 - 2.2     Lipase    Collection Time: 08/02/24  6:36 PM   Result Value Ref Range    Lipase 16 13 - 60 U/L   Magnesium     Collection Time: 08/02/24  6:36 PM   Result Value Ref Range  Magnesium  2.0 1.6 - 2.4 mg/dL   Procalcitonin    Collection Time: 08/02/24  6:36 PM   Result Value Ref Range    Procalcitonin 0.07 ng/mL   Troponin    Collection Time: 08/02/24  6:36 PM   Result Value Ref Range    Troponin T 16.1 (H) 0 - 14 ng/L   T4, Free    Collection Time: 08/02/24  6:36 PM   Result Value Ref Range    T4 Free 1.1 0.9 - 1.6 ng/dL   TSH    Collection Time: 08/02/24  6:36 PM   Result Value Ref Range    TSH, 3rd Generation 0.691 0.270 - 4.200 uIU/mL   Venous Blood Gas, POC    Collection Time: 08/02/24  6:43 PM   Result Value Ref Range    PH, VENOUS (POC) 7.37 7.23 - 7.44      PCO2, Venus, POC 57.3 (H) 41.0 - 52.0 mmHg    PO2, VENOUS (POC) 43 (H) 25 - 40 mmHg    HCO3, Venous 33.2 (H) 22.0 - 26.0 mmol/L    SO2, VENOUS (POC) 76.1 %    BASE EXCESS, VENOUS (POC) 6.6 mmol/L    Specimen type: Venous      Performed by: Weyman Callander    COVID-19 & Influenza Combo    Collection Time: 08/02/24  7:30 PM    Specimen: Nasopharynx   Result Value Ref Range    SARS-CoV-2, PCR Not Detected Not Detected      Rapid Influenza A By PCR Not Detected Not Detected      Rapid Influenza B By PCR Not Detected Not Detected     Venous Blood Gas, POC    Collection Time: 08/02/24  8:05 PM   Result Value Ref Range    PH, VENOUS (POC) 7.31 7.23 - 7.44      PCO2, Venus, POC 65.4 (H) 41.0 - 52.0 mmHg    PO2, VENOUS (POC) 32 25 - 40 mmHg    HCO3, Venous 32.9 (H) 22.0 - 26.0 mmol/L    SO2, VENOUS (POC) 53.3 %    BASE EXCESS, VENOUS (POC) 5.1 mmol/L    Specimen type: Venous      Performed by: Baltazar Nest    Urinalysis with Reflex to Culture    Collection Time: 08/02/24  9:50 PM    Specimen: Urine   Result Value Ref Range    Color, UA Straw      Appearance Clear Clear      Specific Gravity, UA 1.015 1.003 - 1.030      pH, Urine 5.0 5.0 - 8.0      Protein, UA Negative Negative mg/dL    Glucose, Ur 50 (A) Negative mg/dL    Ketones, Urine Negative Negative mg/dL    Bilirubin, Urine Negative Negative      Blood, Urine Small (A) Negative      Urobilinogen, Urine 0.1 (L) 0.2 - 1.0 EU/dL    Nitrite, Urine Negative Negative      Leukocyte Esterase, Urine Small (A) Negative      WBC, UA 5-10 0 - 4 /hpf    RBC, UA 5-10 0 - 5 /hpf    Epithelial Cells, UA Few Few /lpf    BACTERIA, URINE 1+ (A) Negative /hpf    Urine Culture if Indicated Culture not indicated by UA result Culture not indicated by UA result           CT Result (most recent):  CTA CHEST W  WO CONTRAST 08/02/2024    Narrative  EXAM:  CTA CHEST W WO CONTRAST    INDICATION: Worsening shortness of breath    COMPARISON: 06/04/2024    TECHNIQUE: Helical thin section chest CT following intravenous administration of  nonionic contrast 100 mL of isovue  370 according to departmental PE protocol.  Coronal and sagittal reformats were performed. 3D post processing was performed.  CT dose reduction was achieved through the use  of a standardized protocol  tailored for this examination and automatic exposure control for dose  modulation.    FINDINGS: This is a good quality study for the evaluation of pulmonary embolism  to the first subsegmental arterial level. There is no pulmonary embolism to this  level. 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      MEDIASTINUM: No mass or lymphadenopathy.  HILA: No mass or lymphadenopathy.  THORACIC AORTA: No aneurysm.  HEART: Normal in size.  ESOPHAGUS: No wall thickening or dilatation.  TRACHEA/BRONCHI: Patent.  PLEURA: Moderate right pleural effusion and smaller left pleural effusion are  now noted.  LUNGS: Atelectasis right middle lobe and bilateral lower lobes.  UPPER ABDOMEN: Cirrhotic morphology of the liver.  BONES: No aggressive bone lesion or fracture.    Impression  Bilateral pleural effusions are now noted, right greater than left. Bilateral  lower lobe, right middle lobe, and lingular atelectasis is evident. No evidence  of pulmonary embolism.      Electronically signed by DARICE PERI Sprinkle Result (most recent):  XR CHEST PORTABLE 07/24/2024    Narrative  EXAM:  XR CHEST PORTABLE    INDICATION: SOB    COMPARISON: 07/04/2024    TECHNIQUE: portable chest AP view    FINDINGS: The heart is significantly enlarged. There is unchanged mild edema.    There is increased small right pleural effusion. There is unchanged patchy  opacification in the right lower lobe. The visualized bones and upper abdomen  are age-appropriate.    Impression  1. New small right pleural effusion.  2. Patchy airspace disease or atelectasis in the right lower lobe.  3. Unchanged cardiomegaly and mild edema      Electronically signed by MASSIE BRAVO PEAT      ________________________________________________________________________  Signed: Sotero Miyamoto, MD        Procedures: see electronic medical records for all procedures/Xrays/labs and details which were not copied into this note  but were reviewed prior to creation of Plan.  "

## 2024-08-03 NOTE — Consults (Signed)
 "          Pulmonary, Allergy, and Sleep Medicine~Consult Note    Name: Breanna Lester MRN: [id number]   DOB: 12/04/1960 Hospital: [facility]   Date: 08/03/2024 10:52 AM Admission: 08/03/2024     Impression Plan   Acute on chronic hypoxic hypercapnic respiratory failure, in setting of volume overload, +/- COPD exacerbation, 2 L at baseline, previously on NIV therapy but recently self discontinued due to poor tolerance, not followed up with outpatient provider yet  HFpEF, acute exacerbation, bilateral pleural effusions, proBNP 1495, in setting of Bumex  noncompliance  COPD, +/- COPD exacerbation, unknown FEV1, not on any maintenance inhaler, not bronchospastic on exam   A-fib  Diabetes  Anxiety/depression  Morbid obesity  Polysubstance use-prior admission, UDS positive for amphetamine  Homeless, SDOH Oxygen to maintain goal saturation >90%, wean as able.   Bronchodilators, recommend Breztri on discharge  Solu-Medrol , decreased frequency, transition to OCS as clinically able  Pulmonary toilet-Mucinex , scheduled DuoNebs  Empiric Abx, follow cultures  Diuresis per primary, discussed importance of compliance with Bumex   Eliquis   PPI  Appreciate case management consult      Thanks for the consult! Established with Dr. Allaudin with [facility], Intolerant to NIV at home, recommend follow up in OP with him. If pt desires to establish in [facility] as her [place] is closer to there now, she can arrange OP visit with our clinic.      Daily Progression:    Consult note:  Consult requested by hospital medicine service for acute on chronic hypoxic hypercapnic respiratory failure, COPD exacerbation.  Patient initially presented to [facility] ED with shortness of breath and cough.  Initial evaluation in the ED with O2 requirements of 6 L, VBG pH 7.37, pCO2 57, bicarb 33, serum bicarb 35, proBNP 1495, troponin chronically elevated but stable at 16.1.  CTA with no PE,  bilateral pleural effusions, right greater than left, bilateral lower lobe and RML atelectasis.  Of note recently hospitalized in November 2025 due to PNA and COPD exacerbation.  Patient reported that she stopped taking Bumex , is disabled and has been living in various motels over the last 3 years.  Most recently she was living in [place] that had black mold before she recently moved to a [employer] that is fairly clean per her report.  Started on antibiotics, steroids, IV diuresis.    Today she reports that she is feeling better but is now having some sinus drainage.  Notes that her and her daughter live together and are both disabled and homeless.  Her daughter recently got over pneumonia and she reports that she herself recently had some fevers and chills but no hemoptysis, mucopurulent sputum production, wheezing or cough.  She states that she only has albuterol  inhalers and nebulizers at home.  She was previously set up for NIV with Dr. Allaudin but states it does not work for her.  Has not followed up with his office since her most recent hospitalization in November 2025.    I have reviewed the labs and previous days notes.    Pertinent items are noted in HPI.  Past Medical History:   Diagnosis Date    Anxiety     Depression     Gastrointestinal disorder     Heart failure (HCC)     Hyperlipidemia     Hypertension     Liver disease 2009    liver failure    Pneumonia  Past Surgical History:   Procedure Laterality Date    ADENOIDECTOMY  1972    BACK SURGERY      cement discs following a fall    CATARACT REMOVAL      CESAREAN SECTION  1986 and 1993    DILATION AND CURETTAGE OF UTERUS  1999    EGD TRANSORAL BIOPSY SINGLE/MULTIPLE  05/17/2009         HYSTERECTOMY (CERVIX STATUS UNKNOWN)  2008    TONSILLECTOMY  1972    UPPER GASTROINTESTINAL ENDOSCOPY N/A 04/17/2024    ESOPHAGOGASTRODUODENOSCOPY performed by Madlyn Fendt, MD at [facility] ENDOSCOPY      Prior to Admission medications   Medication Sig Start Date  End Date Taking? Authorizing Provider   doxycycline  hyclate (VIBRAMYCIN ) 100 MG capsule Take 1 capsule by mouth 2 times daily   Yes [provider]   predniSONE  (DELTASONE ) 20 MG tablet Take 1 tablet by mouth daily   Yes [provider]   albuterol  (PROVENTIL ) (2.5 MG/3ML) 0.083% nebulizer solution Take 3 mLs by nebulization 2 times daily   Yes [provider]   sertraline  (ZOLOFT ) 100 MG tablet Take 1 tablet by mouth daily 07/24/24  Yes Roselyn Carlin Charleston, MD   metoprolol  tartrate (LOPRESSOR ) 50 MG tablet Take 1 tablet by mouth 2 times daily 06/12/24  Yes Gillard, Madison A, PA-C   albuterol  sulfate HFA (VENTOLIN  HFA) 108 (90 Base) MCG/ACT inhaler Inhale 2 puffs into the lungs 4 times daily as needed for Wheezing 04/11/24 08/03/24 Yes Bertell Bathe, MD   albuterol  sulfate HFA (VENTOLIN  HFA) 108 (90 Base) MCG/ACT inhaler Inhale 2 puffs into the lungs 4 times daily as needed for Wheezing 07/24/24   Roselyn Carlin Charleston, MD   atorvastatin  (LIPITOR ) 40 MG tablet Take 1 tablet by mouth nightly  Patient not taking: Reported on 08/02/2024 06/12/24   Ronn Dixon A, PA-C   OLANZapine  (ZYPREXA ) 10 MG tablet Take 1 tablet by mouth nightly  Patient not taking: Reported on 08/02/2024 06/12/24   Ronn Dixon A, PA-C   bumetanide  (BUMEX ) 1 MG tablet Take 1 tablet by mouth daily 06/12/24   Gillard, Madison A, PA-C   metFORMIN  (GLUCOPHAGE -XR) 500 MG extended release tablet Take 1 tablet by mouth daily (with breakfast)  Patient not taking: Reported on 07/04/2024    [provider]   apixaban  (ELIQUIS ) 5 MG TABS tablet Take 1 tablet by mouth 2 times daily  Patient not taking: Reported on 07/04/2024 05/09/24   Karenann Barter, MD   guaiFENesin  (MUCINEX ) 600 MG extended release tablet Take 1 tablet by mouth 2 times daily 05/09/24   Karenann Barter, MD   pantoprazole  (PROTONIX ) 40 MG tablet Take 1 tablet by mouth 2 times daily (before meals)  Patient taking differently: Take 1 tablet by mouth  daily 05/09/24   Karenann Barter, MD   Melatonin 10 MG TABS Take 1 tablet by mouth at bedtime    [provider]   lactobacillus (CULTURELLE) capsule Take 1 capsule by mouth daily (with breakfast) 04/26/24   Bowlin, Amber L, APRN - CNP   Blood Glucose Monitoring Suppl (BLOOD GLUCOSE MONITOR SYSTEM) w/Device KIT Pharmacist to identify preferred meter and strips. 04/11/24   Smithson, Olivia LABOR, APRN - CNS   blood glucose monitor strips Test 1-2 times a day & as needed for symptoms of irregular blood glucose. Dispense sufficient amount for indicated testing frequency plus additional to accommodate PRN testing needs. Pharmacist to identify preferred brand. 04/11/24   Smithson, Olivia LABOR, APRN -  CNS   Lancets 30G MISC Test 1-2 times a day & as needed for symptoms of irregular blood glucose. Dispense sufficient amount for indicated testing frequency plus additional to accommodate PRN testing needs. Pharmacist to identify preferred brand. 04/11/24   Smithson, Olivia LABOR, APRN - CNS   acetaminophen  (TYLENOL ) 325 MG tablet Take 1 tablet by mouth every 6 hours as needed for Pain 02/26/24 03/07/24  Cutchins, Morene DASEN, PA-C     Allergies   Allergen Reactions    Latex Rash      Social History     Tobacco Use    Smoking status: Former     Current packs/day: 0.00     Types: Cigarettes     Quit date: 02/28/2008     Years since quitting: 16.4    Smokeless tobacco: Never   Substance Use Topics    Alcohol use: Never      Family History   Problem Relation Age of Onset    Cancer Mother     Heart Disease Father      OBJECTIVE:     Vital Signs:     BP 134/68   Pulse 80   Temp 98.1 F (36.7 C)   Resp 18   Ht 1.575 m (5' 2)   Wt 117 kg (257 lb 15 oz)   SpO2 94%   BMI 47.18 kg/m    Temp (24hrs), Avg:98.1 F (36.7 C), Min:97.4 F (36.3 C), Max:98.5 F (36.9 C)     Intake/Output:     Last shift: No intake/output data recorded.    Last 3 shifts: 12/12 1901 - 12/14 0700  In: 200 [P.O.:200]  Out: 0         Intake/Output Summary (Last  24 hours) at 08/03/2024 1052  Last data filed at 08/03/2024 0345  Gross per 24 hour   Intake 200 ml   Output 0 ml   Net 200 ml       Physical Exam:                                        Exam Findings Other   General: No resp distress noted, appears stated age, on oxygen, obese    HEENT:  No ulcers, JVD not elevated, no cervical LAD    Chest: No pectus deformity, normal chest rise b/l    HEART:  RRR, no murmurs/rubs/gallops    Lungs:  Clear to auscultation in her upper lobes, diminished in her bases    ABD: Soft nontender    EXT: Dependent lower extremity edema, normal peripheral pulses    Skin: No rashes or ulcers, no mottling    Neuro: A/O x 3        Medications:  Current Facility-Administered Medications   Medication Dose Route Frequency    prochlorperazine  (COMPAZINE ) injection 10 mg  10 mg IntraVENous Q6H PRN    cefTRIAXone  (ROCEPHIN ) 2,000 mg in sterile water  20 mL IV syringe  2,000 mg IntraVENous Q24H    doxycycline  (VIBRAMYCIN ) 100 mg in sodium chloride  0.9 % 100 mL IVPB (addEASE)  100 mg IntraVENous Q12H    sodium chloride  flush 0.9 % injection 5-40 mL  5-40 mL IntraVENous 2 times per day    sodium chloride  flush 0.9 % injection 5-40 mL  5-40 mL IntraVENous PRN    0.9 % sodium chloride  infusion   IntraVENous PRN    potassium chloride  (KLOR-CON )  extended release tablet 40 mEq  40 mEq Oral PRN    Or    potassium bicarb-citric acid  (EFFER-K ) effervescent tablet 40 mEq  40 mEq Oral PRN    Or    potassium chloride  10 mEq/100 mL IVPB (Peripheral Line)  10 mEq IntraVENous PRN    magnesium  sulfate 2000 mg in 50 mL IVPB premix  2,000 mg IntraVENous PRN    polyethylene glycol (GLYCOLAX ) packet 17 g  17 g Oral Daily PRN    acetaminophen  (TYLENOL ) tablet 650 mg  650 mg Oral Q6H PRN    Or    acetaminophen  (TYLENOL ) suppository 650 mg  650 mg Rectal Q6H PRN    melatonin tablet 3 mg  3 mg Oral Nightly PRN    ipratropium 0.5 mg-albuterol  2.5 mg (DUONEB ) nebulizer solution 1 Dose  1 Dose Inhalation 4x Daily RT     methylPREDNISolone  sodium succ (SOLU-MEDROL ) 40 mg in sterile water  1 mL injection  40 mg IntraVENous Q8H    furosemide  (LASIX ) injection 40 mg  40 mg IntraVENous Daily    diclofenac  sodium (VOLTAREN ) 1 % gel 2 g  2 g Topical 4x Daily PRN    furosemide  (LASIX ) injection 40 mg  40 mg IntraVENous Once    [START ON 08/04/2024] sertraline  (ZOLOFT ) tablet 100 mg  100 mg Oral Daily    insulin  lispro (HUMALOG ,ADMELOG ) injection vial 0-8 Units  0-8 Units SubCUTAneous 4x Daily AC & HS    apixaban  (ELIQUIS ) tablet 5 mg  5 mg Oral BID    pantoprazole  (PROTONIX ) tablet 40 mg  40 mg Oral QAM AC    miconazole  (MICOTIN) 2 % powder   Topical BID    glucose chewable tablet 16 g  4 tablet Oral PRN    dextrose  bolus 10% 125 mL  125 mL IntraVENous PRN    Or    dextrose  bolus 10% 250 mL  250 mL IntraVENous PRN    glucagon  injection 1 mg  1 mg SubCUTAneous PRN    dextrose  10 % infusion   IntraVENous Continuous PRN    insulin  glargine (LANTUS ) injection vial 29 Units  0.25 Units/kg SubCUTAneous Daily    arformoterol  tartrate (BROVANA ) nebulizer solution 15 mcg  15 mcg Nebulization BID RT    budesonide  (PULMICORT ) nebulizer suspension 500 mcg  0.5 mg Nebulization BID RT    guaiFENesin  (MUCINEX ) extended release tablet 1,200 mg  1,200 mg Oral BID       Labs:  ABG      CBC Recent Labs     08/02/24  1836   WBC 6.2   HGB 9.1*   HCT 33.2*   PLT 121*   MCV 92.2   MCH 25.3*        Metabolic  Panel Recent Labs     08/02/24  1836 08/03/24  0637   NA 142 141   K 4.3 4.5   CL 100 97*   CO2 35* 35*   BUN 20 17   MG 2.0  --    ALT 18  --         Pertinent Labs                Deitra Ko, APRN - CNP  08/03/2024  "

## 2024-08-03 NOTE — Progress Notes (Signed)
 "[facility]  [address] [place], [place] [address]  [phone]    [facility]                                                                                          Hospitalist Progress Note  Cozette GORMAN Fear, MD        Date of Service:  08/03/2024  NAME:  SAMARY SHATZ  DOB:  [birth year]  MRN:  [id number]      Interval history / Subjective:   Patient states she feels better.  States she felt short of breath overnight     Assessment & Plan:     Acute on chronic hypoxic respiratory failure  - Patient uses 2 L nasal cannula at baseline, currently requiring 3 L  - Continue Solu-Medrol , increased dose to 60 twice daily  - Continue Pulmicort /Brovana , scheduled DuoNebs  - Continue Rocephin /Doxy  - Pulmonology following    Acute on chronic HFpEF  - Continue with Lasix .  She has been noncompliant with Bumex   - Low-sodium diet    Type 2 diabetes  - Recent A1c 8.1  - Continue SSI, Lantus     Atrial fibrillation  - Restarted Eliquis .  Uncertain why she was taken off of it    Anxiety and depression  - Continue Zoloft       Outisde Records, prior notes, labs, radiology, and medications reviewed     Code status: Full code  DVT prophylaxis: Lovenox        Hospital Problems           Last Modified POA    * (Principal) COPD exacerbation (HCC) 08/03/2024 Yes    Acute on chronic respiratory failure with hypoxia (HCC) 08/03/2024 Yes          Review of Systems:   Pertinent items are noted in HPI.       Vital Signs:    Last 24hrs VS reviewed since prior progress note. Most recent are:  Vitals:    08/03/24 1213   BP:    Pulse: 87   Resp: 18   Temp:    SpO2: 93%         Intake/Output Summary (Last 24 hours) at 08/03/2024 1227  Last data filed at 08/03/2024 0345  Gross per 24 hour   Intake 200 ml   Output 0 ml   Net 200 ml        Physical Examination:             Constitutional:  No acute distress, cooperative, pleasant    ENT:  Oral mucosa moist, oropharynx benign.     Resp:  CTA bilaterally. No wheezing/rhonchi/rales. No accessory muscle use   CV:  Regular rhythm, normal rate, no murmurs, gallops, rubs    GI:  Soft, non distended, non tender. normoactive bowel sounds, no hepatosplenomegaly     Musculoskeletal:  No edema, warm, 2+ pulses throughout    Neurologic:  Moves all extremities.  AAOx3, CN II-XII reviewed     Psych:  Good insight, Not anxious nor agitated.  Data Review:    Review and/or order of clinical lab test      Labs:     Recent Labs     08/02/24  1836   WBC 6.2   HGB 9.1*   HCT 33.2*   PLT 121*     Recent Labs     08/02/24  1836 08/03/24  0637   NA 142 141   K 4.3 4.5   CL 100 97*   CO2 35* 35*   BUN 20 17   MG 2.0  --      Lab Results   Component Value Date/Time    ALT 18 08/02/2024 06:36 PM    ALT 21 07/04/2024 12:15 PM    ALT 19 07/02/2024 07:55 PM    GLOB 3.4 08/02/2024 06:36 PM    GLOB 3.1 07/04/2024 12:15 PM    GLOB 3.7 07/02/2024 07:55 PM     Lab Results   Component Value Date/Time    INR 1.3 05/03/2024 11:21 AM    INR 1.3 04/23/2024 07:01 PM      Lab Results   Component Value Date/Time    IRON 40 06/10/2024 03:00 PM    TIBC 264 06/10/2024 03:00 PM      No results found for: RBCF   No results for input(s): PH, PCO2, PO2 in the last 72 hours.  No results found for: CPK  No results found for: CHOL, CHLST, CHOLV, HDL, HDLC, LDL, LDLC  No results found for: GLUCPOC  No results found for: UA      Medications Reviewed:     Current Facility-Administered Medications   Medication Dose Route Frequency    prochlorperazine  (COMPAZINE ) injection 10 mg  10 mg IntraVENous Q6H PRN    cefTRIAXone  (ROCEPHIN ) 2,000 mg in sterile water  20 mL IV syringe  2,000 mg IntraVENous Q24H    doxycycline  (VIBRAMYCIN ) 100 mg in sodium chloride  0.9 % 100 mL IVPB (addEASE)  100 mg IntraVENous Q12H    sodium chloride  flush 0.9 % injection 5-40 mL  5-40 mL IntraVENous 2 times per day    sodium chloride  flush 0.9 % injection 5-40 mL  5-40 mL IntraVENous PRN     0.9 % sodium chloride  infusion   IntraVENous PRN    potassium chloride  (KLOR-CON ) extended release tablet 40 mEq  40 mEq Oral PRN    Or    potassium bicarb-citric acid  (EFFER-K ) effervescent tablet 40 mEq  40 mEq Oral PRN    Or    potassium chloride  10 mEq/100 mL IVPB (Peripheral Line)  10 mEq IntraVENous PRN    magnesium  sulfate 2000 mg in 50 mL IVPB premix  2,000 mg IntraVENous PRN    polyethylene glycol (GLYCOLAX ) packet 17 g  17 g Oral Daily PRN    acetaminophen  (TYLENOL ) tablet 650 mg  650 mg Oral Q6H PRN    Or    acetaminophen  (TYLENOL ) suppository 650 mg  650 mg Rectal Q6H PRN    melatonin tablet 3 mg  3 mg Oral Nightly PRN    ipratropium 0.5 mg-albuterol  2.5 mg (DUONEB ) nebulizer solution 1 Dose  1 Dose Inhalation 4x Daily RT    furosemide  (LASIX ) injection 40 mg  40 mg IntraVENous Daily    diclofenac  sodium (VOLTAREN ) 1 % gel 2 g  2 g Topical 4x Daily PRN    furosemide  (LASIX ) injection 40 mg  40 mg IntraVENous Once    [START ON 08/04/2024] sertraline  (ZOLOFT ) tablet 100 mg  100 mg Oral Daily  insulin  lispro (HUMALOG ,ADMELOG ) injection vial 0-8 Units  0-8 Units SubCUTAneous 4x Daily AC & HS    apixaban  (ELIQUIS ) tablet 5 mg  5 mg Oral BID    pantoprazole  (PROTONIX ) tablet 40 mg  40 mg Oral QAM AC    miconazole  (MICOTIN) 2 % powder   Topical BID    glucose chewable tablet 16 g  4 tablet Oral PRN    dextrose  bolus 10% 125 mL  125 mL IntraVENous PRN    Or    dextrose  bolus 10% 250 mL  250 mL IntraVENous PRN    glucagon  injection 1 mg  1 mg SubCUTAneous PRN    dextrose  10 % infusion   IntraVENous Continuous PRN    insulin  glargine (LANTUS ) injection vial 29 Units  0.25 Units/kg SubCUTAneous Daily    arformoterol  tartrate (BROVANA ) nebulizer solution 15 mcg  15 mcg Nebulization BID RT    budesonide  (PULMICORT ) nebulizer suspension 500 mcg  0.5 mg Nebulization BID RT    guaiFENesin  (MUCINEX ) extended release tablet 1,200 mg  1,200 mg Oral BID    methylPREDNISolone  sodium succ (SOLU-MEDROL ) 40 mg in sterile  water  1 mL injection  40 mg IntraVENous Q12H     ______________________________________________________________________  EXPECTED LENGTH OF STAY:    ACTUAL LENGTH OF STAY:          0                 Cozette GORMAN Fear, MD   "

## 2024-08-03 NOTE — Plan of Care (Signed)
 "  Problem: Physical Therapy - Adult  Goal: By Discharge: Performs mobility at highest level of function for planned discharge setting.  See evaluation for individualized goals.  Description: FUNCTIONAL STATUS PRIOR TO ADMISSION: Patient was modified independent using a rolling walker for functional mobility.    HOME SUPPORT PRIOR TO ADMISSION: The patient lived with daughter in a extended hotel but did not require assistance.    Physical Therapy Goals  Initiated 08/03/2024  1.  Patient will move from supine to sit and sit to supine, scoot up and down, and roll side to side in bed with independence within 7 day(s).    2.  Patient will perform sit to stand with modified independence within 7 day(s).  3.  Patient will transfer from bed to chair and chair to bed with modified independence using the least restrictive device within 7 day(s).  4.  Patient will ambulate with modified independence for 100 feet with the least restrictive device within 7 day(s).     Outcome: Progressing   PHYSICAL THERAPY EVALUATION    Patient: Breanna Lester (63 y.o. female)  Date: 08/03/2024  Primary Diagnosis: COPD exacerbation (HCC) [J44.1]  Acute on chronic respiratory failure with hypoxia (HCC) [J96.21]       Precautions:              ASSESSMENT :   From the initial evaluation, the patient is limited by decreased functional mobility, independence in ADLs, high-level IADLs, strength, body mechanics, activity tolerance, endurance, safety awareness, coordination, balance, posture due to his/her hospitalization for COPD exacerbation.     Skilled therapy services are medically necessary in this setting to address these impairments.     Patient lives daughter in a extended hotel first level no steps to enter. Patient uses 2 liters of oxygen at home at all times. Use a rolling walker at base line. Communicated with nurse cleared for therapy. Patient supine on bed when received, mobilized patient today with and without rolling walker. Sit to  stand SBA ambulate without assistive device forward and back and lateral side steps and ambulate towards the recliner. OOB to chair as tolerated however patient asked to just go back to bed to rest. Assisted back to bed and place bed to modified chair position. Oxygen saturations stayed above 95% on 2 liters during and after therapy. Activated bed alarm and notified nurse whoa greed to monitor patient.      Functional Outcome Measure:  The patient scored /24 on the ampac outcome measure which is indicative of continued skilled therapy.         PLAN :  Recommendations and Planned Interventions:   bed mobility training, transfer training, gait training, therapeutic exercises, neuromuscular re-education, patient and family training/education, and therapeutic activities    Frequency/Duration: Patient will be followed by physical therapy to address goals, PT Plan of Care: 5 times/week to address goals.      Recommendations for mobility with staff: Recommend that staff completes patient mobility with assist x1 using gait belt and rolling walker.    Recommendations for toileting with staff:  recommended toilet device: the bathroom.     Recommend for next PT session: further progression of gait with existing device    Recommendation for discharge: (in order for the patient to meet his/her long term goals):   Intermittent physical therapy up to 2-3x/week in previous living setting    Other factors to consider for discharge: no additional factors    IF patient discharges home will need  the following DME: patient owns DME required for discharge                SUBJECTIVE:   Patient stated ok.    OBJECTIVE DATA SUMMARY:       Past Medical History:   Diagnosis Date    Anxiety     Depression     Gastrointestinal disorder     Heart failure (HCC)     Hyperlipidemia     Hypertension     Liver disease 2009    liver failure    Pneumonia      Past Surgical History:   Procedure Laterality Date    ADENOIDECTOMY  1972    BACK SURGERY       cement discs following a fall    CATARACT REMOVAL      CESAREAN SECTION  1986 and 1993    DILATION AND CURETTAGE OF UTERUS  1999    EGD TRANSORAL BIOPSY SINGLE/MULTIPLE  05/17/2009         HYSTERECTOMY (CERVIX STATUS UNKNOWN)  2008    TONSILLECTOMY  1972    UPPER GASTROINTESTINAL ENDOSCOPY N/A 04/17/2024    ESOPHAGOGASTRODUODENOSCOPY performed by Madlyn Fendt, MD at [facility] ENDOSCOPY       Home Situation:  Social/Functional History  Lives With: Daughter  Type of Home:  (hotel)  Home Layout: One level  Home Access: Level entry  Home Equipment: Rollator, Environmental Consultant - Rolling  Has the patient had two or more falls in the past year or any fall with injury in the past year?: Yes  Receives Help From: Family  Prior Level of Assist for ADLs: Independent  Prior Level of Assist for Ambulation: Independent household ambulator, with or without Programmer, Applications: No    Cognitive/Behavioral Status:            Hearing:        Vision/Perceptual:                      Strength:    Strength: Within functional limits    Tone & Sensation:           Coordination:  Coordination: Within functional limits    Range Of Motion:  AROM: Within functional limits  PROM: Within functional limits    Functional Mobility:  Bed Mobility:     Bed Mobility Training  Bed Mobility Training: Yes  Overall Level of Assistance: Stand by assistance  Interventions: Safety awareness training;Verbal cues  Rolling: Stand by assistance  Supine to Sit: Stand by assistance  Sit to Supine: Stand by assistance  Scooting: Stand by assistance  Transfers:     Art Therapist: Yes  Overall Level of Assistance: Stand by assistance  Interventions: Verbal cues  Sit to Stand: Stand by assistance  Stand to Sit: Stand by assistance  Stand Pivot Transfers: Stand by assistance  Bed to Chair: Stand by assistance  Balance:               Balance  Sitting: Intact  Standing: With support  Ambulation/Gait Training:                       Gait  Gait Training: Yes  Overall  Level of Assistance: Contact guard assistance  Distance (ft): 10 Feet  Assistive Device: Gait belt;Walker, rolling  Interventions: Safety awareness training;Verbal cues  Base of Support: Widened  Speed/Cadence: Pace decreased (< 100 feet/min)  Step Length: Right shortened;Left shortened  Gait Abnormalities: Path deviations;Step to gait                             Treatment/Education Rendered:     Yes  Treatment intervention was deemed medically necessary and was provided to the patient under the charge(s) of Gait Training and Therapeutic Activity to address the functional impairments.  The skilled treatment consisted of     Patient Education  Education Given To: Patient  Education Provided: Role of Therapy;Energy Conservation;Plan of Care;IADL Safety;Orientation;Home Exercise Program;Precautions;Equipment;Transfer Tax Inspector;Fall Prevention Strategies;ADL Adaptive Strategies  Education Method: Verbal  Barriers to Learning: None  Education Outcome: Continued education needed        Pain Rating:  0/10   Pain Intervention(s):   nursing notified and addressing    Activity Tolerance:   Good    After treatment:   Patient left in no apparent distress sitting up in chair, Patient left in no apparent distress in bed and placed in chair position., Call bell within reach, Bed/ chair alarm activated, Side rails x3, and Heels elevated for pressure relief    INTERDISCIPLINARY COMMUNICATION:   The patient's plan of care was discussed with: registered nurse      Thank you for this referral.  Ariele Vidrio E DELA CRUZ, PT,WCC.  Minutes: 27      Physical Therapy Evaluation Charge Determination   History Examination Presentation Decision-Making   LOW Complexity : Zero comorbidities / personal factors that will impact the outcome / POC LOW Complexity : 1-2 Standardized tests and measures addressing body structure, function, activity limitation and / or participation in recreation  LOW Complexity : Stable, uncomplicated   AM-PAC  LOW    Based on the above components, the patient evaluation is determined to be of the following complexity level: Low   "

## 2024-08-03 NOTE — ED Triage Notes (Signed)
"  Pt here from freestanding ER for PNA. Wears NC 2L at home, had to go up to 4L today to be comfortable breathing. Pt used rescue inhaler twice without relief, used home neb with some relief.   "

## 2024-08-03 NOTE — ED Triage Notes (Signed)
"  Patient placed in stretcher from [facility]   "

## 2024-08-03 NOTE — Plan of Care (Signed)
"    Problem: Chronic Conditions and Co-morbidities  Goal: Patient's chronic conditions and co-morbidity symptoms are monitored and maintained or improved  Outcome: Progressing     Problem: ABCDS Injury Assessment  Goal: Absence of physical injury  Outcome: Progressing     Problem: Safety - Adult  Goal: Free from fall injury  Outcome: Progressing     Problem: Pain  Goal: Verbalizes/displays adequate comfort level or baseline comfort level  Outcome: Progressing     "

## 2024-08-04 LAB — POCT GLUCOSE
POC Glucose: 256 mg/dL — ABNORMAL HIGH (ref 65–117)
POC Glucose: 193 mg/dL — ABNORMAL HIGH (ref 65–117)
POC Glucose: 229 mg/dL — ABNORMAL HIGH (ref 65–117)
POC Glucose: 175 mg/dL — ABNORMAL HIGH (ref 65–117)

## 2024-08-04 LAB — RESPIRATORY PANEL, MOLECULAR, WITH COVID-19

## 2024-08-04 LAB — CBC WITH AUTO DIFFERENTIAL
Basophils %: 0.3 % (ref 0.0–1.0)
Basophils Absolute: 0.02 K/UL (ref 0.00–0.10)
Eosinophils %: 0 % (ref 0.0–7.0)
Eosinophils Absolute: 0 K/UL (ref 0.00–0.40)
Hematocrit: 31.4 % — ABNORMAL LOW (ref 35.0–47.0)
Hemoglobin: 9.1 g/dL — ABNORMAL LOW (ref 11.5–16.0)
Immature Granulocytes %: 0.7 % — ABNORMAL HIGH (ref 0.0–0.5)
Immature Granulocytes Absolute: 0.05 K/UL — ABNORMAL HIGH (ref 0.00–0.04)
Lymphocytes %: 7.2 % — ABNORMAL LOW (ref 12.0–49.0)
Lymphocytes Absolute: 0.53 K/UL — ABNORMAL LOW (ref 0.80–3.50)
MCH: 25.3 pg — ABNORMAL LOW (ref 26.0–34.0)
MCHC: 29 g/dL — ABNORMAL LOW (ref 30.0–36.5)
MCV: 87.5 FL (ref 80.0–99.0)
MPV: 11.8 FL (ref 8.9–12.9)
Monocytes %: 2.3 % — ABNORMAL LOW (ref 5.0–13.0)
Monocytes Absolute: 0.17 K/UL (ref 0.00–1.00)
Neutrophils %: 89.5 % — ABNORMAL HIGH (ref 32.0–75.0)
Neutrophils Absolute: 6.53 K/UL (ref 1.80–8.00)
Nucleated RBCs: 0 /100{WBCs}
Platelets: 105 K/uL — ABNORMAL LOW (ref 150–400)
RBC: 3.59 M/uL — ABNORMAL LOW (ref 3.80–5.20)
RDW: 16.6 % — ABNORMAL HIGH (ref 11.5–14.5)
WBC: 7.3 K/uL (ref 3.6–11.0)
nRBC: 0 K/uL (ref 0.00–0.01)

## 2024-08-04 LAB — HEMOGLOBIN A1C
Estimated Avg Glucose: 153 mg/dL
Hemoglobin A1C: 7 % — ABNORMAL HIGH (ref 4.0–5.6)

## 2024-08-04 MED ORDER — IPRATROPIUM-ALBUTEROL 0.5-2.5 (3) MG/3ML IN SOLN
0.5-2.5 | RESPIRATORY_TRACT | Status: DC | PRN
Start: 2024-08-04 — End: 2024-08-06

## 2024-08-04 MED ORDER — KETOTIFEN FUMARATE 0.035 % OP SOLN
0.035 | Freq: Two times a day (BID) | OPHTHALMIC | Status: DC
Start: 2024-08-04 — End: 2024-08-06
  Administered 2024-08-04 – 2024-08-06 (×5): 1 [drp] via OPHTHALMIC

## 2024-08-04 MED FILL — PANTOPRAZOLE SODIUM 40 MG PO TBEC: 40 mg | ORAL | Qty: 1

## 2024-08-04 MED FILL — CEFTRIAXONE SODIUM 2 G IJ SOLR: 2 g | INTRAMUSCULAR | Qty: 2000

## 2024-08-04 MED FILL — GUAIFENESIN ER 600 MG PO TB12: 600 mg | ORAL | Qty: 2

## 2024-08-04 MED FILL — EYE ITCH RELIEF 0.035 % OP SOLN: 0.035 % | OPHTHALMIC | Qty: 5

## 2024-08-04 MED FILL — INSULIN LISPRO 100 UNIT/ML IJ SOLN: 100 [IU]/mL | INTRAMUSCULAR | Qty: 2

## 2024-08-04 MED FILL — SERTRALINE HCL 50 MG PO TABS: 50 mg | ORAL | Qty: 2

## 2024-08-04 MED FILL — LANTUS 100 UNIT/ML SC SOLN: 100 [IU]/mL | SUBCUTANEOUS | Qty: 29

## 2024-08-04 MED FILL — ELIQUIS 5 MG PO TABS: 5 mg | ORAL | Qty: 1

## 2024-08-04 MED FILL — FUROSEMIDE 10 MG/ML IJ SOLN: 10 mg/mL | INTRAMUSCULAR | Qty: 4

## 2024-08-04 MED FILL — INSULIN LISPRO 100 UNIT/ML IJ SOLN: 100 [IU]/mL | INTRAMUSCULAR | Qty: 4

## 2024-08-04 MED FILL — DOXYCYCLINE HYCLATE 100 MG IV SOLR: 100 mg | INTRAVENOUS | Qty: 100

## 2024-08-04 MED FILL — METHYLPREDNISOLONE SODIUM SUCC 125 MG IJ SOLR: 125 mg | INTRAMUSCULAR | Qty: 125

## 2024-08-04 MED FILL — ACETAMINOPHEN 325 MG PO TABS: 325 mg | ORAL | Qty: 2

## 2024-08-04 MED FILL — KETOTIFEN FUMARATE 0.035 % OP SOLN: 0.035 % | OPHTHALMIC | Qty: 5

## 2024-08-04 NOTE — Plan of Care (Signed)
 "  Problem: Occupational Therapy - Adult  Goal: By Discharge: Performs self-care activities at highest level of function for planned discharge setting.  See evaluation for individualized goals.  Description: FUNCTIONAL STATUS PRIOR TO ADMISSION:  Patient reports using rolling walker for safe ambulation and able to perform ADL tasks on her own.  Receives Help From: Family, Prior Level of Assist for ADLs: Independent,  ,  ,  ,  ,  ,  ,  ,  , Active Driver: No     HOME SUPPORT: Patient lived daughter in an extended stay hotel.    Occupational Therapy Goals:  Initiated 08/04/2024  1.  Patient will perform lower body dressing with Modified Independence within 7 day(s).  2.  Patient will perform grooming with Modified Independence within 7 day(s).  3.  Patient will perform toilet transfers with Modified Independence  within 7 day(s).  4.  Patient will perform all aspects of toileting with Modified Independence within 7 day(s).  5.  Patient will participate in upper extremity therapeutic exercise/activities with Modified Independence for 10 minutes within 7 day(s).    6.  Patient will utilize energy conservation techniques during functional activities with verbal cues within 7 day(s).    Outcome: Progressing   OCCUPATIONAL THERAPY EVALUATION    Patient: Breanna Lester (63 y.o. female)  Date: 08/04/2024  Primary Diagnosis: COPD exacerbation (HCC) [J44.1]  Acute on chronic respiratory failure with hypoxia (HCC) [J96.21]         Precautions:                    ASSESSMENT :  From the initial evaluation, the patient is limited by decreased functional mobility, independence in ADLs, strength, activity tolerance, endurance, balance due to his/her hospitalization for COPD exacerbation and acute on chronic respiratory failure with hypoxia. Patient received in bed, agreeable to activity.  She demonstrates overall good mobility and flexibility for ADL tasks, however decreased overall activity secondary to respiratory status and  complaint of back pain.  Patient tolerates activity with rest breaks and cues for pacing.  Patient left in chair in NAD at end of session.     Skilled therapy services are medically necessary in this setting to address these impairments.       PLAN :  Recommendations and Planned Interventions:   self care training, therapeutic activities, functional mobility training, balance training, therapeutic exercise, endurance activities, patient education, home safety training, and family training/education    Frequency/Duration: OT Plan of Care: 5 times/week    Recommendations for mobility with staff: Recommend that staff completes patient mobility with assist x1 using gait belt and rolling walker.    Recommendations for toileting with staff:  recommended toilet device: the bathroom.     Recommendation for discharge: (in order for the patient to meet his/her long term goals):   Intermittent occupational therapy up to 2-3x/week in previous living setting     Other factors to consider for discharge: concern for safely navigating or managing the home environment    IF patient discharges home will need the following DME: patient owns DME required for discharge       SUBJECTIVE:   Patient stated,  I do feel better today.  OBJECTIVE DATA SUMMARY:     Past Medical History:   Diagnosis Date    Anxiety     Depression     Gastrointestinal disorder     Heart failure (HCC)     Hyperlipidemia     Hypertension  Liver disease 2009    liver failure    Pneumonia      Past Surgical History:   Procedure Laterality Date    ADENOIDECTOMY  1972    BACK SURGERY      cement discs following a fall    CATARACT REMOVAL      CESAREAN SECTION  1986 and 1993    DILATION AND CURETTAGE OF UTERUS  1999    EGD TRANSORAL BIOPSY SINGLE/MULTIPLE  05/17/2009         HYSTERECTOMY (CERVIX STATUS UNKNOWN)  2008    TONSILLECTOMY  1972    UPPER GASTROINTESTINAL ENDOSCOPY N/A 04/17/2024    ESOPHAGOGASTRODUODENOSCOPY performed by Madlyn Fendt, MD at [facility] ENDOSCOPY           Expanded or extensive additional review of patient history:   Social/Functional History  Lives With: Daughter  Type of Home:  (extended stay hotel)  Home Layout: One level  Home Access: Level entry  Bathroom Shower/Tub: Medical Sales Representative: Standard  Home Equipment: Rollator, Environmental Consultant - Rolling  Has the patient had two or more falls in the past year or any fall with injury in the past year?: Yes  Receives Help From: Family  Prior Level of Assist for ADLs: Independent  Prior Level of Assist for Ambulation: Independent household ambulator, with or without device  Active Driver: No      Hand Dominance: right     EXAMINATION OF PERFORMANCE DEFICITS:    Cognitive/Behavioral Status:  Orientation  Orientation Level: Oriented X4       Skin: intact as seen    Edema: LEs     Hearing:    WFL          Range of Motion:   AROM: Within functional limits         Strength:  Strength: Generally decreased, functional      Coordination:  Coordination: Within functional limits            Tone & Sensation:   Tone: Normal  Sensation: Intact          Functional Mobility and Transfers for ADLs:    Bed Mobility:     Bed Mobility Training  Bed Mobility Training: Yes  Overall Level of Assistance: Supervision  Interventions: Safety awareness training;Verbal cues  Rolling: Supervision  Supine to Sit: Supervision  Sit to Supine: Supervision  Scooting: Supervision    Transfers:      Art Therapist: Yes  Overall Level of Assistance: Supervision  Interventions: Safety awareness training;Verbal cues  Sit to Stand: Supervision  Stand to Sit: Supervision  Stand Pivot Transfers: Supervision  Bed to Chair: Supervision                     Balance:      Balance  Sitting: High guard  Standing: With support      ADL Assessment:          Feeding: Independent       Grooming: Stand by assistance       UE Bathing: Stand by assistance       Product Used : Bath wipes    LE Bathing: Stand by assistance       UE Dressing:  Stand by assistance       LE Dressing: Stand by assistance       Toileting: Stand by assistance  Treatment/Education Rendered:     Yes  Treatment intervention was deemed medically necessary and was provided to the patient under the charge(s) of Self-care/home management training to address the functional impairments.  The skilled treatment consisted of education for safety within home, tasks modifications and ADL technique.           Pain Rating:  Reports back pain, chronic, does not rate/10   Pain Intervention(s):   repositioning    Activity Tolerance:   Good and requires rest breaks    After treatment:   Patient left in no apparent distress sitting up in chair, Call bell within reach, and Bed/ chair alarm activated    INTERDISCIPLINARY COMMUNICATION:   The patient's plan of care was discussed with: physical therapist and registered nurse    Thank you for this referral.  Tell Rozelle C Charne Mcbrien, OTR/L  Minutes: 21    "

## 2024-08-04 NOTE — Plan of Care (Signed)
"    Problem: Chronic Conditions and Co-morbidities  Goal: Patient's chronic conditions and co-morbidity symptoms are monitored and maintained or improved  08/04/2024 0559 by Vicci Gauze, RN  Outcome: Progressing  08/04/2024 0559 by Vicci Gauze, RN  Outcome: Progressing     Problem: ABCDS Injury Assessment  Goal: Absence of physical injury  08/04/2024 0559 by Vicci Gauze, RN  Outcome: Progressing  08/04/2024 0559 by Vicci Gauze, RN  Outcome: Progressing     Problem: Safety - Adult  Goal: Free from fall injury  08/04/2024 0559 by Vicci Gauze, RN  Outcome: Progressing  08/04/2024 0559 by Vicci Gauze, RN  Outcome: Progressing     Problem: Pain  Goal: Verbalizes/displays adequate comfort level or baseline comfort level  08/04/2024 0559 by Vicci Gauze, RN  Outcome: Progressing  08/04/2024 0559 by Vicci Gauze, RN  Outcome: Progressing     Problem: Respiratory - Adult  Goal: Achieves optimal ventilation and oxygenation  08/04/2024 0559 by Vicci Gauze, RN  Outcome: Progressing  08/04/2024 0559 by Vicci Gauze, RN  Outcome: Progressing     "

## 2024-08-04 NOTE — Plan of Care (Signed)
 "  Problem: Physical Therapy - Adult  Goal: By Discharge: Performs mobility at highest level of function for planned discharge setting.  See evaluation for individualized goals.  Description: FUNCTIONAL STATUS PRIOR TO ADMISSION: Patient was modified independent using a rolling walker for functional mobility.    HOME SUPPORT PRIOR TO ADMISSION: The patient lived with daughter in a extended hotel but did not require assistance.    Physical Therapy Goals  Initiated 08/03/2024  1.  Patient will move from supine to sit and sit to supine, scoot up and down, and roll side to side in bed with independence within 7 day(s).    2.  Patient will perform sit to stand with modified independence within 7 day(s).  3.  Patient will transfer from bed to chair and chair to bed with modified independence using the least restrictive device within 7 day(s).  4.  Patient will ambulate with modified independence for 100 feet with the least restrictive device within 7 day(s).     Outcome: Progressing   PHYSICAL THERAPY TREATMENT    Patient: Breanna Lester (63 y.o. female)  Date: 08/04/2024  Diagnosis: COPD exacerbation (HCC) [J44.1]  Acute on chronic respiratory failure with hypoxia (HCC) [J96.21] COPD exacerbation (HCC)      Precautions:              ASSESSMENT:  Patient continues to benefit from skilled PT services and is progressing towards goals. Communicated with nurse cleared for therapy. Patient supine on bed when received, mobilized patient today with rolling walker supervision, ambulate in he room and out on the 5th floor hallway supervision no loss of balance steady sitting and standing.  Patient on 2 liters oxygen 24/7 at base line has a concentrator at home. Saturations ranges between 88% to 95% before during and after ambulation. Educated and producer, television/film/video conservation/ purse lip breathing when saturation dropped to below 90% during ambulation. Tolerated ambulation out on the 5th floor hallway decreased pace. OOB to chair as  tolerated performed some active range of motion exercise on both LE all planes. Educated and instructed patient to continue to do active range of motion exercise on both LE multiple times as tolerated while on bed and while on the chair. Recommend to be up on the chair at least every meal times when tolerated. Activated chair alarm and notified nurse who agreed to monitor patient.        PLAN:  Patient continues to benefit from skilled intervention to address the above impairments.  Continue treatment per established plan of care.      Recommendations for mobility with staff: Recommend that staff completes patient mobility with assist x1 using gait belt and rolling walker.    Recommendations for toileting with staff:  recommended toilet device: the bathroom.     Recommend for next PT session: further progression of gait with existing device and monitor O2 saturations during activty    Recommendation for discharge: (in order for the patient to meet his/her long term goals):   Intermittent physical therapy up to 2-3x/week in previous living setting    Other factors to consider for discharge: no additional factors    IF patient discharges home will need the following DME: patient owns DME required for discharge       SUBJECTIVE:   Patient stated, ok.    OBJECTIVE DATA SUMMARY:   Critical Behavior:  Orientation  Orientation Level: Oriented X4       Functional Mobility Training:  Bed Mobility:  Bed  Mobility Training  Bed Mobility Training: Yes  Overall Level of Assistance: Supervision  Interventions: Safety awareness training;Verbal cues  Rolling: Supervision  Supine to Sit: Supervision  Sit to Supine: Supervision  Scooting: Supervision  Transfers:  Art Therapist: Yes  Overall Level of Assistance: Supervision  Interventions: Safety awareness training;Verbal cues  Sit to Stand: Supervision  Stand to Sit: Supervision  Stand Pivot Transfers: Supervision  Bed to Chair:  Supervision  Balance:  Balance  Sitting: High guard  Standing: With support   Ambulation/Gait Training:     Gait  Gait Training: Yes  Overall Level of Assistance: Stand by assistance        Neuro Re-Education:                    Pain Rating:  0/10   Pain Intervention(s):   nursing notified and addressing    Activity Tolerance:   Good    After treatment:   Patient left in no apparent distress sitting up in chair, Call bell within reach, Bed/ chair alarm activated, and Heels elevated for pressure relief      COMMUNICATION/EDUCATION:   The patient's plan of care was discussed with: occupational therapist and registered nurse           Quashawn Jewkes E DELA CRUZ, PT,WCC.  Minutes: 23   "

## 2024-08-04 NOTE — Progress Notes (Signed)
"  Spiritual Health Progress Note  [place]      Room # 501/01    Name: Breanna Lester           Age: 63 y.o.    Gender: female          MRN: [id number]  Religion: Baptist       Preferred Language: English      Date: 08/04/24  Visit Time: Begin Time: 1454 End Time : 1532  Complexity of Encounter: Moderate      Visit Summary: Chaplain met with Breanna Lester in response to rounding for loneliness patients. [name] shared she prays and has a strong belief in God that helps her face her difficulties. She chared she does not have a particular belief system she subscribes to. Patient stated they did not have a sense of social support and chart review revealed she is in the Tele-Chaplain program. [name]'s social support consists of her youngest daughter who livs with her [name]) and an older daughter in [place]  [name]). Other family members are deceased. Chaplain provided active listening, prayer, discussion of illness, theological discussion and explored coping strategies. Chaplain's plan of care includes follow up at patient's request. Family or visitors were not present during visit.     Referral/Consult From: Nurse  Encounter Overview/Reason: Loneliness/Social Isolation  Encounter Code: Encounter Code: V0998 Assessment by chaplain services   Crisis (if applicable): Type:  (None)  Service Provided For: Patient     Patient was available.    Faith, Belief, Meaning:   Patient identifies as spiritual  is not connected with a faith tradition or spiritual practice  has beliefs or practices that help with coping during difficult times  faith/ spirituality is a source of strength  Family/Friends No family/friends present  Rituals (if applicable) Type:  (None)    Importance and Influence:  Patient has spiritual/personal beliefs that influence decisions regarding their health  Family/Friends No family/friends present    Community:  Patient   indicated that they do not feel well-supported  Support System Includes    Children  Family/Friends   No family/ friends present.    Assessment and Plan of Care:   Emotions Expressed by Patient:   Assessment: Anxious, Stress overload, Social isolation, Fearful, Powerlessness, Impaired resilience, Concerns with suffering, Sad, Loneliness    Interventions by Chaplain:   Intervention: Active listening, Nurtured Hope, Explored Coping Skills/Resources, Discussed relationship with God, Prayer (assurance of)/Blessing, Discussed illness injury and its impact, Sustaining Presence/Ministry of presence, Explored/Affirmed feelings, thoughts, concerns     Result/ Response by Patient:   Outcome: Acceptance, Comfort, Encouraged, Less anxious, Less agitated, Engaged in conversation, Expressed feelings, needs, and concerns, Coping, Expressed Gratitude, Expressed regrets, Receptive    Patient Plan of Care:   Plan and Referrals  Plan/Referrals: Other (Comment) (Spiritual Health visit available upon request.)     Emotions Expressed by Spouse/Family/Friends:   No family/ friends present when chaplain visited.    Chaplain Interventions with Spouse/ Family/Friends include:   No family present    Spouse/Family/Friends Plan of Care:   No family present      Electronically signed by   Elia Mems H. Johnny, M.Div. on 08/04/2024 at 3:36 PM.   Spiritual Health   [facility]  Page a chaplain [phone]- PRAY [phone])      "

## 2024-08-04 NOTE — Progress Notes (Signed)
 "[facility]  [address] [place], [place] [address]  [phone]    [facility]                                                                                          Hospitalist Progress Note  Cozette GORMAN Fear, MD        Date of Service:  08/04/2024  NAME:  Breanna Lester  DOB:  [birth year]  MRN:  [id number]      Interval history / Subjective:   Patient reports feeling better today.  Coughing up a little bit more which she says is good     Assessment & Plan:     Acute on chronic hypoxic respiratory failure  - Patient uses 2 L nasal cannula at baseline, currently requiring 3 L, turned down to 2 in the room  - Continue Solu-Medrol , will plan on switching to p.o. tomorrow  - Continue Pulmicort /Brovana , DuoNebs as needed  - Continue Rocephin /Doxy  - Pulmonology following    Acute on chronic HFpEF  - Continue with Lasix .  She has been noncompliant with Bumex   - Low-sodium diet    Type 2 diabetes  - Recent A1c 8.1  - Continue SSI, Lantus     Atrial fibrillation  - Restarted Eliquis .  Uncertain why she was taken off of it    Anxiety and depression  - Continue Zoloft       Outisde Records, prior notes, labs, radiology, and medications reviewed     Code status: Full code  DVT prophylaxis: Lovenox        Hospital Problems           Last Modified POA    * (Principal) COPD exacerbation (HCC) 08/03/2024 Yes    Acute on chronic respiratory failure with hypoxia (HCC) 08/03/2024 Yes          Review of Systems:   Pertinent items are noted in HPI.       Vital Signs:    Last 24hrs VS reviewed since prior progress note. Most recent are:  Vitals:    08/04/24 0919   BP:    Pulse:    Resp:    Temp:    SpO2: 97%         Intake/Output Summary (Last 24 hours) at 08/04/2024 1355  Last data filed at 08/04/2024 9170  Gross per 24 hour   Intake 300 ml   Output --   Net 300 ml        Physical Examination:             Constitutional:  No acute distress, cooperative, pleasant     ENT:  Oral mucosa moist, oropharynx benign.    Resp:  CTA bilaterally. No wheezing/rhonchi/rales. No accessory muscle use   CV:  Regular rhythm, normal rate, no murmurs, gallops, rubs    GI:  Soft, non distended, non tender. normoactive bowel sounds, no hepatosplenomegaly     Musculoskeletal:  No edema, warm, 2+ pulses throughout    Neurologic:  Moves all extremities.  AAOx3, CN II-XII reviewed     Psych:  Good insight, Not anxious nor agitated.       Data Review:    Review and/or order of clinical lab test      Labs:     Recent Labs     08/02/24  1836 08/04/24  0438   WBC 6.2 7.3   HGB 9.1* 9.1*   HCT 33.2* 31.4*   PLT 121* 105*     Recent Labs     08/02/24  1836 08/03/24  0637   NA 142 141   K 4.3 4.5   CL 100 97*   CO2 35* 35*   BUN 20 17   MG 2.0  --      Lab Results   Component Value Date/Time    ALT 18 08/02/2024 06:36 PM    ALT 21 07/04/2024 12:15 PM    ALT 19 07/02/2024 07:55 PM    GLOB 3.4 08/02/2024 06:36 PM    GLOB 3.1 07/04/2024 12:15 PM    GLOB 3.7 07/02/2024 07:55 PM     Lab Results   Component Value Date/Time    INR 1.3 05/03/2024 11:21 AM    INR 1.3 04/23/2024 07:01 PM      Lab Results   Component Value Date/Time    IRON 40 06/10/2024 03:00 PM    TIBC 264 06/10/2024 03:00 PM      No results found for: RBCF   No results for input(s): PH, PCO2, PO2 in the last 72 hours.  No results found for: CPK  No results found for: CHOL, CHLST, CHOLV, HDL, HDLC, LDL, LDLC  No results found for: GLUCPOC  No results found for: UA      Medications Reviewed:     Current Facility-Administered Medications   Medication Dose Route Frequency    ipratropium 0.5 mg-albuterol  2.5 mg (DUONEB ) nebulizer solution 1 Dose  1 Dose Inhalation Q4H PRN    prochlorperazine  (COMPAZINE ) injection 10 mg  10 mg IntraVENous Q6H PRN    doxycycline  (VIBRAMYCIN ) 100 mg in sodium chloride  0.9 % 100 mL IVPB (addEASE)  100 mg IntraVENous Q12H    sodium chloride  flush 0.9 % injection 5-40 mL  5-40 mL IntraVENous 2  times per day    sodium chloride  flush 0.9 % injection 5-40 mL  5-40 mL IntraVENous PRN    0.9 % sodium chloride  infusion   IntraVENous PRN    potassium chloride  (KLOR-CON ) extended release tablet 40 mEq  40 mEq Oral PRN    Or    potassium bicarb-citric acid  (EFFER-K ) effervescent tablet 40 mEq  40 mEq Oral PRN    Or    potassium chloride  10 mEq/100 mL IVPB (Peripheral Line)  10 mEq IntraVENous PRN    magnesium  sulfate 2000 mg in 50 mL IVPB premix  2,000 mg IntraVENous PRN    polyethylene glycol (GLYCOLAX ) packet 17 g  17 g Oral Daily PRN    acetaminophen  (TYLENOL ) tablet 650 mg  650 mg Oral Q6H PRN    Or    acetaminophen  (TYLENOL ) suppository 650 mg  650 mg Rectal Q6H PRN    melatonin tablet 3 mg  3 mg Oral Nightly PRN    furosemide  (LASIX ) injection 40 mg  40 mg IntraVENous Daily    diclofenac  sodium (VOLTAREN ) 1 % gel 2 g  2 g Topical 4x Daily PRN    furosemide  (LASIX ) injection 40 mg  40 mg IntraVENous Once    sertraline  (ZOLOFT ) tablet 100 mg  100 mg Oral Daily    insulin  lispro (HUMALOG ,ADMELOG )  injection vial 0-8 Units  0-8 Units SubCUTAneous 4x Daily AC & HS    apixaban  (ELIQUIS ) tablet 5 mg  5 mg Oral BID    pantoprazole  (PROTONIX ) tablet 40 mg  40 mg Oral QAM AC    miconazole  (MICOTIN) 2 % powder   Topical BID    glucose chewable tablet 16 g  4 tablet Oral PRN    dextrose  bolus 10% 125 mL  125 mL IntraVENous PRN    Or    dextrose  bolus 10% 250 mL  250 mL IntraVENous PRN    glucagon  injection 1 mg  1 mg SubCUTAneous PRN    dextrose  10 % infusion   IntraVENous Continuous PRN    insulin  glargine (LANTUS ) injection vial 29 Units  0.25 Units/kg SubCUTAneous Daily    arformoterol  tartrate (BROVANA ) nebulizer solution 15 mcg  15 mcg Nebulization BID RT    budesonide  (PULMICORT ) nebulizer suspension 500 mcg  0.5 mg Nebulization BID RT    guaiFENesin  (MUCINEX ) extended release tablet 1,200 mg  1,200 mg Oral BID    methylPREDNISolone  sodium succ (SOLU-MEDROL ) 60 mg in sterile water  0.96 mL injection  60 mg  IntraVENous Q12H     ______________________________________________________________________  EXPECTED LENGTH OF STAY:    ACTUAL LENGTH OF STAY:          1                 Cozette GORMAN Fear, MD   "

## 2024-08-05 ENCOUNTER — Inpatient Hospital Stay: Admit: 2024-08-05 | Discharge: 2024-08-20 | Payer: Medicare (Managed Care) | Primary: Diagnostic Radiology

## 2024-08-05 ENCOUNTER — Inpatient Hospital Stay: Admit: 2024-08-05 | Payer: Medicare (Managed Care) | Primary: Diagnostic Radiology

## 2024-08-05 DIAGNOSIS — R0602 Shortness of breath: Secondary | ICD-10-CM

## 2024-08-05 LAB — ECHO (TTE) LIMITED (PRN CONTRAST/BUBBLE/STRAIN/3D)
AV Area by Peak Velocity: 2.3 cm2
AV Area by VTI: 2.5 cm2
AV Mean Gradient: 9 mmHg
AV Mean Velocity: 1.4 m/s
AV Peak Gradient: 20 mmHg
AV Peak Velocity: 2.3 m/s
AV VTI: 47.7 cm
AV Velocity Ratio: 0.74
AVA/BSA Peak Velocity: 1.1 cm2/m2
AVA/BSA VTI: 1.2 cm2/m2
Ao Root Index: 1.17 cm/m2
Aortic Root: 2.5 cm
Body Surface Area: 2.26 m2
EF BP: 73 % (ref 55–100)
EF Physician: 60 %
Est. RA Pressure: 8 mmHg
Fractional Shortening 2D: 32 % (ref 28–44)
IVSd: 0.8 cm (ref 0.6–0.9)
LV EDV A2C: 111 mL
LV EDV A4C: 109 mL
LV EDV BP: 111 mL — AB (ref 56–104)
LV EDV Index A2C: 52 mL/m2
LV EDV Index A4C: 51 mL/m2
LV EDV Index BP: 52 mL/m2
LV ESV A2C: 27 mL
LV ESV A4C: 31 mL
LV ESV BP: 30 mL (ref 19–49)
LV ESV Index A2C: 13 mL/m2
LV ESV Index A4C: 15 mL/m2
LV ESV Index BP: 14 mL/m2
LV Ejection Fraction A2C: 75 %
LV Ejection Fraction A4C: 72 %
LV Mass 2D Index: 97.2 g/m2 — AB (ref 43–95)
LV Mass 2D: 207.1 g — AB (ref 67–162)
LV RWT Ratio: 0.56
LVIDd Index: 2.35 cm/m2
LVIDd: 5 cm (ref 3.9–5.3)
LVIDs Index: 1.6 cm/m2
LVIDs: 3.4 cm
LVOT Area: 3.1 cm2
LVOT Diameter: 2 cm
LVOT Mean Gradient: 6 mmHg
LVOT Peak Gradient: 12 mmHg
LVOT Peak Velocity: 1.7 m/s
LVOT SV: 117.3 mL
LVOT Stroke Volume Index: 57.1 mL/m2
LVOT VTI: 38.7 cm
LVOT:AV VTI Index: 0.81
LVPWd: 1.4 cm — AB (ref 0.6–0.9)

## 2024-08-05 LAB — BASIC METABOLIC PANEL
Anion Gap: 9 mmol/L (ref 2–14)
BUN/Creatinine Ratio: 29 — ABNORMAL HIGH (ref 12–20)
BUN: 24 mg/dL — ABNORMAL HIGH (ref 8–23)
CO2: 39 mmol/L — ABNORMAL HIGH (ref 20–29)
Calcium: 9.1 mg/dL (ref 8.8–10.2)
Chloride: 93 mmol/L — ABNORMAL LOW (ref 98–107)
Creatinine: 0.82 mg/dL (ref 0.60–1.00)
Est, Glom Filt Rate: 80 ml/min/1.73m2 (ref >59)
Glucose: 275 mg/dL — ABNORMAL HIGH (ref 65–100)
Potassium: 4.1 mmol/L (ref 3.5–5.1)
Sodium: 142 mmol/L (ref 136–145)

## 2024-08-05 LAB — POCT GLUCOSE
POC Glucose: 167 mg/dL — ABNORMAL HIGH (ref 65–117)
POC Glucose: 290 mg/dL — ABNORMAL HIGH (ref 65–117)
POC Glucose: 261 mg/dL — ABNORMAL HIGH (ref 65–117)
POC Glucose: 242 mg/dL — ABNORMAL HIGH (ref 65–117)

## 2024-08-05 LAB — MAGNESIUM: Magnesium: 2 mg/dL (ref 1.6–2.4)

## 2024-08-05 MED ORDER — DOXYCYCLINE HYCLATE 100 MG PO TABS
100 | Freq: Two times a day (BID) | ORAL | Status: DC
Start: 2024-08-05 — End: 2024-08-06
  Administered 2024-08-05 – 2024-08-06 (×3): 100 mg via ORAL

## 2024-08-05 MED ORDER — SULFUR HEXAFLUORIDE MICROSPH 60.7-25 MG IJ SUSR
60.7-25 | Freq: Once | INTRAMUSCULAR | Status: AC | PRN
Start: 2024-08-05 — End: 2024-08-05
  Administered 2024-08-05: 17:00:00 2 mL via INTRAVENOUS

## 2024-08-05 MED FILL — DOXYCYCLINE HYCLATE 100 MG IV SOLR: 100 mg | INTRAVENOUS | Qty: 100

## 2024-08-05 MED FILL — LUMASON 60.7-25 MG IJ SUSR: 60.7-25 mg | INTRAMUSCULAR | Qty: 2

## 2024-08-05 MED FILL — INSULIN LISPRO 100 UNIT/ML IJ SOLN: 100 [IU]/mL | INTRAMUSCULAR | Qty: 2

## 2024-08-05 MED FILL — INSULIN LISPRO 100 UNIT/ML IJ SOLN: 100 [IU]/mL | INTRAMUSCULAR | Qty: 4

## 2024-08-05 MED FILL — METHYLPREDNISOLONE SODIUM SUCC 125 MG IJ SOLR: 125 mg | INTRAMUSCULAR | Qty: 125

## 2024-08-05 MED FILL — ACETAMINOPHEN 325 MG PO TABS: 325 mg | ORAL | Qty: 2

## 2024-08-05 MED FILL — SERTRALINE HCL 50 MG PO TABS: 50 mg | ORAL | Qty: 2

## 2024-08-05 MED FILL — BUDESONIDE 0.5 MG/2ML IN SUSP: 0.5 MG/2ML | RESPIRATORY_TRACT | Qty: 2

## 2024-08-05 MED FILL — GUAIFENESIN ER 600 MG PO TB12: 600 mg | ORAL | Qty: 2

## 2024-08-05 MED FILL — PANTOPRAZOLE SODIUM 40 MG PO TBEC: 40 mg | ORAL | Qty: 1

## 2024-08-05 MED FILL — ELIQUIS 5 MG PO TABS: 5 mg | ORAL | Qty: 1

## 2024-08-05 MED FILL — DOXYCYCLINE HYCLATE 100 MG PO TABS: 100 mg | ORAL | Qty: 1

## 2024-08-05 MED FILL — IPRATROPIUM-ALBUTEROL 0.5-2.5 (3) MG/3ML IN SOLN: 0.5-2.5 (3) MG/3ML | RESPIRATORY_TRACT | Qty: 3

## 2024-08-05 MED FILL — FUROSEMIDE 10 MG/ML IJ SOLN: 10 mg/mL | INTRAMUSCULAR | Qty: 4

## 2024-08-05 MED FILL — LANTUS 100 UNIT/ML SC SOLN: 100 [IU]/mL | SUBCUTANEOUS | Qty: 29

## 2024-08-05 MED FILL — ARFORMOTEROL TARTRATE 15 MCG/2ML IN NEBU: 15 MCG/2ML | RESPIRATORY_TRACT | Qty: 2

## 2024-08-05 NOTE — Progress Notes (Signed)
"    Physician Progress Note      PATIENT:               Breanna Lester, Breanna Lester  CSN #:                  [id number]  DOB:                       11/26/1960  ADMIT DATE:       08/03/2024 12:16 AM  DISCH DATE:  RESPONDING  PROVIDER #:        Cozette GORMAN Fairly MD          QUERY TEXT:    Based on your medical judgment, please clarify these findings and document if   any of the following are being evaluated and/or treated:    The clinical indicators include:  ''63 y.o.  female with PMHx copd on 2L O2 at home x 4 months, heart failure   preserved EF 70 to 75%, history of moderate pericardial effusion 1 to 2 cm,   diabetes, HTN, hyperlipidemia, p. Afib , liver cirrhosis-(H&P by Leontine Sotero DEL, MD at 08/03/2024)''    ''P. Afib, taken off eliquis  6 weeks ago at [facility] per patient.  Note from cards   at [facility] 9/25 okay to anticoagulate.  No bleeding -restart eliquis  as CHADSVASC   = 4 -(H&P by Leontine Sotero DEL, MD at 08/03/2024)''    ''Atrial fibrillation- Restarted Eliquis .  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    Query created by: Artemisa Dowse on 08/05/2024 5:14 AM      Electronically signed by:  Cozette GORMAN Fairly MD 08/05/2024 3:48 PM          "

## 2024-08-05 NOTE — Consults (Signed)
 "          Pulmonary, Allergy, and Sleep Medicine~Consult Note    Name: Breanna Lester MRN: [id number]   DOB: 03-06-1961 Hospital: [facility]   Date: 08/05/2024 3:49 PM Admission: 08/03/2024     Impression Plan   Acute on chronic hypoxic hypercapnic respiratory failure, in setting of volume overload, +/- COPD exacerbation, 2 L at baseline, previously on NIV therapy but recently self discontinued due to poor tolerance, not followed up with outpatient provider yet  HFpEF, acute exacerbation, bilateral pleural effusions, proBNP 1495, in setting of Bumex  noncompliance  COPD, +/- COPD exacerbation, unknown FEV1, not on any maintenance inhaler, not bronchospastic on exam   A-fib  Diabetes  Anxiety/depression  Morbid obesity  Polysubstance use-prior admission, UDS positive for amphetamine  Homeless, SDOH Oxygen to maintain goal saturation >90%, wean as able.   Bronchodilators, ideally would be on a triple inhaler, but may not be able to afford. Would discharge on generic advair  for now  Transition to 7 day pred taper tomorrow starting at 40 mg daily  Pulmonary toilet-Mucinex , scheduled DuoNebs  Diuresis per primary, discussed importance of compliance with Bumex   Eliquis   PPI  Appreciate case management consult      Thanks for the consult! Established with Dr. Allaudin with [facility], Intolerant to NIV at home, recommend follow up in OP with him. If pt desires to establish in [facility] as her [place] is closer to there now, she can arrange OP visit with our clinic.      Daily Progression:    Consult note:  Consult requested by hospital medicine service for acute on chronic hypoxic hypercapnic respiratory failure, COPD exacerbation.  Patient initially presented to [facility] ED with shortness of breath and cough.  Initial evaluation in the ED with O2 requirements of 6 L, VBG pH 7.37, pCO2 57, bicarb 33, serum bicarb 35, proBNP 1495, troponin chronically elevated  but stable at 16.1.  CTA with no PE, bilateral pleural effusions, right greater than left, bilateral lower lobe and RML atelectasis.  Of note recently hospitalized in November 2025 due to PNA and COPD exacerbation.  Patient reported that she stopped taking Bumex , is disabled and has been living in various motels over the last 3 years.  Most recently she was living in [place] that had black mold before she recently moved to a [employer] that is fairly clean per her report.  Started on antibiotics, steroids, IV diuresis.    Today she reports that she is feeling better but is now having some sinus drainage.  Notes that her and her daughter live together and are both disabled and homeless.  Her daughter recently got over pneumonia and she reports that she herself recently had some fevers and chills but no hemoptysis, mucopurulent sputum production, wheezing or cough.  She states that she only has albuterol  inhalers and nebulizers at home.  She was previously set up for NIV with Dr. Allaudin but states it does not work for her.  Has not followed up with his office since her most recent hospitalization in November 2025.    12/16: feeling much better today. Usually on 2 liters at home, currently on 3 L    Pertinent items are noted in HPI.  Past Medical History:   Diagnosis Date    Anxiety     Depression     Gastrointestinal disorder     Heart failure (HCC)     Hyperlipidemia  Hypertension     Liver disease 2009    liver failure    Pneumonia       Past Surgical History:   Procedure Laterality Date    ADENOIDECTOMY  1972    BACK SURGERY      cement discs following a fall    CATARACT REMOVAL      CESAREAN SECTION  1986 and 1993    DILATION AND CURETTAGE OF UTERUS  1999    EGD TRANSORAL BIOPSY SINGLE/MULTIPLE  05/17/2009         HYSTERECTOMY (CERVIX STATUS UNKNOWN)  2008    TONSILLECTOMY  1972    UPPER GASTROINTESTINAL ENDOSCOPY N/A 04/17/2024    ESOPHAGOGASTRODUODENOSCOPY performed by Madlyn Fendt, MD at [facility] ENDOSCOPY       Prior to Admission medications   Medication Sig Start Date End Date Taking? Authorizing Provider   doxycycline  hyclate (VIBRAMYCIN ) 100 MG capsule Take 1 capsule by mouth 2 times daily   Yes [provider]   predniSONE  (DELTASONE ) 20 MG tablet Take 1 tablet by mouth daily   Yes [provider]   albuterol  (PROVENTIL ) (2.5 MG/3ML) 0.083% nebulizer solution Take 3 mLs by nebulization 2 times daily   Yes [provider]   sertraline  (ZOLOFT ) 100 MG tablet Take 1 tablet by mouth daily 07/24/24  Yes Roselyn Carlin Charleston, MD   metoprolol  tartrate (LOPRESSOR ) 50 MG tablet Take 1 tablet by mouth 2 times daily 06/12/24  Yes Gillard, Madison A, PA-C   albuterol  sulfate HFA (VENTOLIN  HFA) 108 (90 Base) MCG/ACT inhaler Inhale 2 puffs into the lungs 4 times daily as needed for Wheezing 04/11/24 08/03/24 Yes Bertell Bathe, MD   albuterol  sulfate HFA (VENTOLIN  HFA) 108 (90 Base) MCG/ACT inhaler Inhale 2 puffs into the lungs 4 times daily as needed for Wheezing 07/24/24   Roselyn Carlin Charleston, MD   atorvastatin  (LIPITOR ) 40 MG tablet Take 1 tablet by mouth nightly  Patient not taking: Reported on 08/02/2024 06/12/24   Ronn Dixon A, PA-C   OLANZapine  (ZYPREXA ) 10 MG tablet Take 1 tablet by mouth nightly  Patient not taking: Reported on 08/02/2024 06/12/24   Ronn Dixon A, PA-C   bumetanide  (BUMEX ) 1 MG tablet Take 1 tablet by mouth daily 06/12/24   Gillard, Madison A, PA-C   metFORMIN  (GLUCOPHAGE -XR) 500 MG extended release tablet Take 1 tablet by mouth daily (with breakfast)  Patient not taking: Reported on 07/04/2024    [provider]   apixaban  (ELIQUIS ) 5 MG TABS tablet Take 1 tablet by mouth 2 times daily  Patient not taking: Reported on 07/04/2024 05/09/24   Karenann Barter, MD   guaiFENesin  (MUCINEX ) 600 MG extended release tablet Take 1 tablet by mouth 2 times daily 05/09/24   Karenann Barter, MD   pantoprazole  (PROTONIX ) 40 MG tablet Take 1 tablet by mouth 2 times daily  (before meals)  Patient taking differently: Take 1 tablet by mouth daily 05/09/24   Karenann Barter, MD   Melatonin 10 MG TABS Take 1 tablet by mouth at bedtime    [provider]   lactobacillus (CULTURELLE) capsule Take 1 capsule by mouth daily (with breakfast) 04/26/24   Bowlin, Amber L, APRN - CNP   Blood Glucose Monitoring Suppl (BLOOD GLUCOSE MONITOR SYSTEM) w/Device KIT Pharmacist to identify preferred meter and strips. 04/11/24   Smithson, Olivia LABOR, APRN - CNS   blood glucose monitor strips Test 1-2 times a day & as needed for symptoms of irregular blood glucose. Dispense sufficient amount  for indicated testing frequency plus additional to accommodate PRN testing needs. Pharmacist to identify preferred brand. 04/11/24   Smithson, Olivia LABOR, APRN - CNS   Lancets 30G MISC Test 1-2 times a day & as needed for symptoms of irregular blood glucose. Dispense sufficient amount for indicated testing frequency plus additional to accommodate PRN testing needs. Pharmacist to identify preferred brand. 04/11/24   Smithson, Olivia LABOR, APRN - CNS   acetaminophen  (TYLENOL ) 325 MG tablet Take 1 tablet by mouth every 6 hours as needed for Pain 02/26/24 03/07/24  Cutchins, Morene DASEN, PA-C     Allergies   Allergen Reactions    Latex Rash      Social History     Tobacco Use    Smoking status: Former     Current packs/day: 0.00     Types: Cigarettes     Quit date: 02/28/2008     Years since quitting: 16.4    Smokeless tobacco: Never   Substance Use Topics    Alcohol use: Never      Family History   Problem Relation Age of Onset    Cancer Mother     Heart Disease Father      OBJECTIVE:     Vital Signs:     BP (!) 144/66   Pulse 71   Temp 98.1 F (36.7 C) (Oral)   Resp 16   Ht 1.575 m (5' 2)   Wt 116.6 kg (257 lb)   SpO2 96%   BMI 47.01 kg/m    Temp (24hrs), Avg:97.9 F (36.6 C), Min:97.7 F (36.5 C), Max:98.1 F (36.7 C)     Intake/Output:     Last shift: 12/16 0701 - 12/16 1900  In: 200 [P.O.:200]  Out: -     Last 3  shifts: 12/14 1901 - 12/16 0700  In: 400 [P.O.:400]  Out: -         Intake/Output Summary (Last 24 hours) at 08/05/2024 1549  Last data filed at 08/05/2024 0758  Gross per 24 hour   Intake 300 ml   Output --   Net 300 ml       Physical Exam:                                        Exam Findings Other   General: No resp distress noted, appears stated age, on oxygen, obese    HEENT:  No ulcers, JVD not elevated, no cervical LAD    Chest: No pectus deformity, normal chest rise b/l    HEART:  RRR, no murmurs/rubs/gallops    Lungs:  Clear to auscultation in her upper lobes, diminished in her bases    ABD: Soft nontender    EXT: Dependent lower extremity edema, normal peripheral pulses    Skin: No rashes or ulcers, no mottling    Neuro: A/O x 3        Medications:  Current Facility-Administered Medications   Medication Dose Route Frequency    doxycycline  hyclate (VIBRA -TABS) tablet 100 mg  100 mg Oral 2 times per day    ipratropium 0.5 mg-albuterol  2.5 mg (DUONEB ) nebulizer solution 1 Dose  1 Dose Inhalation Q4H PRN    ketotifen  fumarate (ZADITOR ) 0.035 % ophthalmic solution 1 drop  1 drop Both Eyes BID    prochlorperazine  (COMPAZINE ) injection 10 mg  10 mg IntraVENous Q6H PRN    sodium chloride  flush 0.9 %  injection 5-40 mL  5-40 mL IntraVENous 2 times per day    sodium chloride  flush 0.9 % injection 5-40 mL  5-40 mL IntraVENous PRN    0.9 % sodium chloride  infusion   IntraVENous PRN    potassium chloride  (KLOR-CON ) extended release tablet 40 mEq  40 mEq Oral PRN    Or    potassium bicarb-citric acid  (EFFER-K ) effervescent tablet 40 mEq  40 mEq Oral PRN    Or    potassium chloride  10 mEq/100 mL IVPB (Peripheral Line)  10 mEq IntraVENous PRN    magnesium  sulfate 2000 mg in 50 mL IVPB premix  2,000 mg IntraVENous PRN    polyethylene glycol (GLYCOLAX ) packet 17 g  17 g Oral Daily PRN    acetaminophen  (TYLENOL ) tablet 650 mg  650 mg Oral Q6H PRN    Or    acetaminophen  (TYLENOL ) suppository 650 mg  650 mg Rectal Q6H PRN     melatonin tablet 3 mg  3 mg Oral Nightly PRN    furosemide  (LASIX ) injection 40 mg  40 mg IntraVENous Daily    diclofenac  sodium (VOLTAREN ) 1 % gel 2 g  2 g Topical 4x Daily PRN    furosemide  (LASIX ) injection 40 mg  40 mg IntraVENous Once    sertraline  (ZOLOFT ) tablet 100 mg  100 mg Oral Daily    insulin  lispro (HUMALOG ,ADMELOG ) injection vial 0-8 Units  0-8 Units SubCUTAneous 4x Daily AC & HS    apixaban  (ELIQUIS ) tablet 5 mg  5 mg Oral BID    pantoprazole  (PROTONIX ) tablet 40 mg  40 mg Oral QAM AC    miconazole  (MICOTIN) 2 % powder   Topical BID    glucose chewable tablet 16 g  4 tablet Oral PRN    dextrose  bolus 10% 125 mL  125 mL IntraVENous PRN    Or    dextrose  bolus 10% 250 mL  250 mL IntraVENous PRN    glucagon  injection 1 mg  1 mg SubCUTAneous PRN    dextrose  10 % infusion   IntraVENous Continuous PRN    insulin  glargine (LANTUS ) injection vial 29 Units  0.25 Units/kg SubCUTAneous Daily    arformoterol  tartrate (BROVANA ) nebulizer solution 15 mcg  15 mcg Nebulization BID RT    budesonide  (PULMICORT ) nebulizer suspension 500 mcg  0.5 mg Nebulization BID RT    guaiFENesin  (MUCINEX ) extended release tablet 1,200 mg  1,200 mg Oral BID    methylPREDNISolone  sodium succ (SOLU-MEDROL ) 60 mg in sterile water  0.96 mL injection  60 mg IntraVENous Q12H       Labs:  ABG      CBC Recent Labs     08/02/24  1836 08/04/24  0438   WBC 6.2 7.3   HGB 9.1* 9.1*   HCT 33.2* 31.4*   PLT 121* 105*   MCV 92.2 87.5   MCH 25.3* 25.3*        Metabolic  Panel Recent Labs     08/02/24  1836 08/03/24  0637 08/05/24  1003   NA 142 141 142   K 4.3 4.5 4.1   CL 100 97* 93*   CO2 35* 35* 39*   BUN 20 17 24*   MG 2.0  --  2.0   ALT 18  --   --         Pertinent Labs                Ted Santos, MD  08/05/2024  "

## 2024-08-05 NOTE — Plan of Care (Signed)
"    Problem: Safety - Adult  Goal: Free from fall injury  Outcome: Progressing     "

## 2024-08-05 NOTE — Consults (Signed)
 "      [facility]                    Cardiology Care Note     [x] Initial Encounter     [] Follow-up    Patient Name: Breanna Lester - DOB:[birth year] - FMW:[id number]  Primary Cardiologist: Dr Jakie  now retired  Consulting Cardiologist: Tomasa Bend, MD     Reason for encounter: 5 beats NSVT    HPI:       Breanna Lester is a 63 y.o. 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 She was transferred from [facility] ED for further management.     Initial evaluation in the ED revealed oxygen requirement of 6 L/min to maintain SpO? between 93-99 %. VBG showed pH 7.37, pCO? 57 mm Hg, pO? 43 mm Hg, and bicarbonate 33 mmol/L; serum bicarbonate was 35 mmol/L. ProBNP was 1495 pg/mL, and troponin was chronically elevated but stable at 16.1 ng/L.      CTA chest demonstrated bilateral pleural effusions (right greater than left), bilateral lower lobe, right middle lobe, and lingular atelectasis without evidence of pulmonary embolism.       Subjective:      Breanna Lester reports dyspnea.     Assessment and Plan     1 NSVT :  Short runs, asymptomatic  - echo 3 months ago with hyperdynamic LV function   - K and Mg need updated today   - can arrange OP appt, given risk factors consider OP stress once COPD exacerbation resolves   - holter      2. COPD/A/C resp failure  - tx per pulmonary   - previously on NIV therapy but recently self discontinued due to poor tolerance     3 Hx HF p EF  - apparently non complaint with bumex  as OP  - needs to establish with cardiology , apparently moves frequently based on chart review   - lasix  40 mg IV qd   - needs daily lasix  40 mh upon  discharge    4 Hx a fib   - OAC has been resumed     4 T 2 DM        Will arrange OP f/u   Cardiology will sign off        ____________________________________________________________    Cardiac testing  05/02/24    ECHO (TTE) COMPLETE (PRN CONTRAST/BUBBLE/STRAIN/3D) 05/06/2024 10:52 AM (Final)    Interpretation Summary    Left Ventricle: Normal left ventricular systolic function with a visually estimated EF of 70 - 75%. Left ventricle size is normal. Normal wall thickness. Normal wall motion. Normal diastolic function.    Right Ventricle: Right ventricle is moderately dilated. Normal systolic function.    Right Atrium: Right atrium is mildly dilated.    Pericardium: Moderate (1-2 cm) localized pericardial effusion present around the left ventricle. Pericardial posterior effusion measures 1.0 cm.    Image quality is adequate. Contrast used: Lumason . Technically difficult study, technically difficult study with poor endocardial visualization, technically difficult study due to patient's body habitus and technically difficult study due to patient's heart rhythm.  Signed by: Laquetta Commander, MD on 05/06/2024 10:52 AM    2019 nuclear stress test LVEF 85% no ischemia       Most recent HS troponins:  Recent Labs     08/02/24  1836   TROPONINT 16.1*      Recent Results (from the past 24 hours)   POCT Glucose    Collection Time: 08/04/24  8:00 AM   Result Value Ref Range    POC Glucose 193 (H) 65 - 117 mg/dL    Performed by: Nettie Gauze    POCT Glucose    Collection Time: 08/04/24 11:16 AM   Result Value Ref Range    POC Glucose 229 (H) 65 - 117 mg/dL    Performed by: Zina Kato    POCT Glucose    Collection Time: 08/04/24  4:32 PM   Result Value Ref Range    POC Glucose 175 (H) 65 - 117 mg/dL    Performed by: Nettie Gauze    POCT Glucose    Collection Time: 08/04/24  8:22 PM   Result Value Ref Range    POC Glucose 167 (H) 65 - 117 mg/dL    Performed by: Gwyneth Pouch      ECG:   Encounter Date: 08/02/24   EKG 12  Lead (SOB)   Result Value    Ventricular Rate 56    Atrial Rate 56    P-R Interval 142    QRS Duration 88    Q-T Interval 498    QTc Calculation (Bazett) 480    P Axis 48    R Axis 80    T Axis 88    Diagnosis      Sinus bradycardia with Premature atrial complexes with Abberant conduction  Nonspecific T wave abnormality  Prolonged QT  Abnormal ECG  When compared with ECG of 24-Jul-2024 17:02,  No significant change was found  Confirmed by DAVE MD, KAMLESH ([id number]) on 08/02/2024 6:37:22 PM           Review of Systems:    [] All other systems reviewed and all negative except as written in HPI    []  Patient unable to provide secondary to condition    Past Medical History:   Diagnosis Date    Anxiety     Depression     Gastrointestinal disorder     Heart failure (HCC)     Hyperlipidemia     Hypertension     Liver disease 2009    liver failure    Pneumonia      Past Surgical History:   Procedure Laterality Date    ADENOIDECTOMY  1972    BACK SURGERY      cement discs following a fall    CATARACT REMOVAL      CESAREAN SECTION  1986 and 1993    DILATION AND CURETTAGE OF UTERUS  1999    EGD TRANSORAL BIOPSY SINGLE/MULTIPLE  05/17/2009         HYSTERECTOMY (CERVIX STATUS UNKNOWN)  2008    TONSILLECTOMY  1972    UPPER GASTROINTESTINAL ENDOSCOPY N/A 04/17/2024    ESOPHAGOGASTRODUODENOSCOPY performed by Madlyn Fendt, MD at [facility] ENDOSCOPY     Social Hx:  reports that she quit smoking about 16 years ago. Her smoking use included cigarettes. She has never used smokeless tobacco. She reports that she does not drink alcohol and does not use drugs.  Family Hx: family history includes Cancer in her mother; Heart Disease in her father.  Allergies  Allergen Reactions    Latex Rash          OBJECTIVE:  Wt Readings from Last 3 Encounters:   08/03/24 117 kg (257 lb 15 oz)   08/02/24 122.5 kg (270 lb)   07/24/24 122.5 kg (270 lb)     I/O last 3 completed shifts:  In: 400 [P.O.:400]  Out: -   No intake/output data recorded.        Physical  Exam:    Vitals:   Vitals:    08/04/24 0919 08/04/24 1500 08/04/24 1927 08/04/24 2300   BP:   (!) 142/65    Pulse:  91 91 69   Resp:   17    Temp:   97.7 F (36.5 C)    TempSrc:   Oral    SpO2: 97%  100%    Weight:       Height:         Telemetry: normal sinus rhythm NSVT    Gen: Well-developed, well-nourished, in no acute distress  Neck: Supple, No JVD, No Carotid Bruit  Resp: No accessory muscle use, + exp wheezing   Card: Regular Rate,Rythm, Normal S1, S2, No murmurs, rubs or gallop. No thrills.   Abd:   obese,Soft, non-tender, non-distended, BS+   MSK: No cyanosis  Skin: No rashes    Neuro: Moving all four extremities, follows commands appropriately  Psych: Good insight, oriented to person, place, alert, Nml Affect  LE: No edema    Data Review:     Radiology:     XR CHEST PORTABLE 07/24/2024    Narrative  EXAM:  XR CHEST PORTABLE    INDICATION: SOB    COMPARISON: 07/04/2024    TECHNIQUE: portable chest AP view    FINDINGS: The heart is significantly enlarged. There is unchanged mild edema.    There is increased small right pleural effusion. There is unchanged patchy  opacification in the right lower lobe. The visualized bones and upper abdomen  are age-appropriate.    Impression  1. New small right pleural effusion.  2. Patchy airspace disease or atelectasis in the right lower lobe.  3. Unchanged cardiomegaly and mild edema      Electronically signed by MASSIE BRAVO PEAT       CT Result (most recent):  CTA CHEST W WO CONTRAST 08/02/2024    Narrative  EXAM:  CTA CHEST W WO CONTRAST    INDICATION: Worsening shortness of breath    COMPARISON: 06/04/2024    TECHNIQUE: Helical thin section chest CT following intravenous administration of  nonionic contrast 100 mL of isovue  370 according to departmental PE protocol.  Coronal and sagittal reformats were performed. 3D post processing was performed.  CT dose reduction was achieved through the use of a standardized protocol  tailored for this examination and automatic  exposure control for dose  modulation.    FINDINGS: This is a good quality study for the evaluation of pulmonary embolism  to the first subsegmental arterial level. There is no pulmonary embolism to this  level. 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      MEDIASTINUM: No mass or lymphadenopathy.  HILA: No mass or lymphadenopathy.  THORACIC AORTA: No aneurysm.  HEART: Normal in size.  ESOPHAGUS: No wall thickening or dilatation.  TRACHEA/BRONCHI: Patent.  PLEURA: Moderate right pleural effusion and smaller left pleural effusion are  now noted.  LUNGS: Atelectasis right middle lobe and bilateral lower lobes.  UPPER ABDOMEN: Cirrhotic morphology of the  liver.  BONES: No aggressive bone lesion or fracture.    Impression  Bilateral pleural effusions are now noted, right greater than left. Bilateral  lower lobe, right middle lobe, and lingular atelectasis is evident. No evidence  of pulmonary embolism.      Electronically signed by DARICE COLON      Lab Results   Component Value Date    WBC 7.3 08/04/2024    HGB 9.1 (L) 08/04/2024    HCT 31.4 (L) 08/04/2024    MCV 87.5 08/04/2024    PLT 105 (L) 08/04/2024       No results for input(s): CHOL, HDLC, LDLC, HBA1C in the last 72 hours.    Invalid input(s): TGL    Lab Results   Component Value Date/Time    NA 141 08/03/2024 06:37 AM    K 4.5 08/03/2024 06:37 AM    CL 97 08/03/2024 06:37 AM    CO2 35 08/03/2024 06:37 AM    BUN 17 08/03/2024 06:37 AM    CREATININE 0.81 08/03/2024 06:37 AM    GLUCOSE 311 08/03/2024 06:37 AM    CALCIUM  8.8 08/03/2024 06:37 AM    LABGLOM 82 08/03/2024 06:37 AM    LABGLOM >60 03/19/2022 11:25 AM    LABGLOM >60 12/07/2021 05:23 PM      No results found for: BNP        Current meds:    Current Facility-Administered Medications:     ipratropium 0.5 mg-albuterol  2.5 mg (DUONEB ) nebulizer solution 1 Dose, 1 Dose, Inhalation, Q4H PRN, Jerome Cozette RAMAN, MD    ketotifen  fumarate (ZADITOR ) 0.035 %  ophthalmic solution 1 drop, 1 drop, Both Eyes, BID, Jerome Cozette RAMAN, MD, 1 drop at 08/04/24 2032    prochlorperazine  (COMPAZINE ) injection 10 mg, 10 mg, IntraVENous, Q6H PRN, Nguyen, Kari H, MD    doxycycline  (VIBRAMYCIN ) 100 mg in sodium chloride  0.9 % 100 mL IVPB (addEASE), 100 mg, IntraVENous, Q12H, Nguyen, Kari H, MD, Stopped at 08/05/24 0033    sodium chloride  flush 0.9 % injection 5-40 mL, 5-40 mL, IntraVENous, 2 times per day, Nguyen, Kari H, MD, 10 mL at 08/04/24 2033    sodium chloride  flush 0.9 % injection 5-40 mL, 5-40 mL, IntraVENous, PRN, Nguyen, Kari H, MD    0.9 % sodium chloride  infusion, , IntraVENous, PRN, Nguyen, Kari H, MD    potassium chloride  (KLOR-CON ) extended release tablet 40 mEq, 40 mEq, Oral, PRN **OR** potassium bicarb-citric acid  (EFFER-K ) effervescent tablet 40 mEq, 40 mEq, Oral, PRN **OR** potassium chloride  10 mEq/100 mL IVPB (Peripheral Line), 10 mEq, IntraVENous, PRN, Nguyen, Kari H, MD    magnesium  sulfate 2000 mg in 50 mL IVPB premix, 2,000 mg, IntraVENous, PRN, Nguyen, Kari H, MD    polyethylene glycol (GLYCOLAX ) packet 17 g, 17 g, Oral, Daily PRN, Nguyen, Kari H, MD    acetaminophen  (TYLENOL ) tablet 650 mg, 650 mg, Oral, Q6H PRN, 650 mg at 08/04/24 0842 **OR** acetaminophen  (TYLENOL ) suppository 650 mg, 650 mg, Rectal, Q6H PRN, Nguyen, Kari H, MD    melatonin tablet 3 mg, 3 mg, Oral, Nightly PRN, Nguyen, Kari H, MD    furosemide  (LASIX ) injection 40 mg, 40 mg, IntraVENous, Daily, Nguyen, Kari H, MD, 40 mg at 08/04/24 0818    diclofenac  sodium (VOLTAREN ) 1 % gel 2 g, 2 g, Topical, 4x Daily PRN, Nguyen, Kari H, MD    furosemide  (LASIX ) injection 40 mg, 40 mg, IntraVENous, Once, Leontine Sotero DEL, MD    sertraline  (ZOLOFT ) tablet 100 mg, 100  mg, Oral, Daily, Nguyen, Kari H, MD, 100 mg at 08/04/24 0818    insulin  lispro (HUMALOG ,ADMELOG ) injection vial 0-8 Units, 0-8 Units, SubCUTAneous, 4x Daily AC & HS, Nguyen, Kari H, MD, 2 Units at 08/04/24 1123    apixaban  (ELIQUIS ) tablet 5  mg, 5 mg, Oral, BID, Nguyen, Kari H, MD, 5 mg at 08/04/24 2032    pantoprazole  (PROTONIX ) tablet 40 mg, 40 mg, Oral, QAM AC, Nguyen, Kari H, MD, 40 mg at 08/05/24 0524    miconazole  (MICOTIN) 2 % powder, , Topical, BID, Edwards, Sabrina C, APRN - NP, Given at 08/04/24 2033    glucose chewable tablet 16 g, 4 tablet, Oral, PRN, Nguyen, Kari H, MD    dextrose  bolus 10% 125 mL, 125 mL, IntraVENous, PRN **OR** dextrose  bolus 10% 250 mL, 250 mL, IntraVENous, PRN, Nguyen, Kari H, MD    glucagon  injection 1 mg, 1 mg, SubCUTAneous, PRN, Nguyen, Kari H, MD    dextrose  10 % infusion, , IntraVENous, Continuous PRN, Nguyen, Kari H, MD    insulin  glargine (LANTUS ) injection vial 29 Units, 0.25 Units/kg, SubCUTAneous, Daily, Nguyen, Kari H, MD, 29 Units at 08/04/24 9182    arformoterol  tartrate (BROVANA ) nebulizer solution 15 mcg, 15 mcg, Nebulization, BID RT, Weidinger, Cara, APRN - CNP, 15 mcg at 08/04/24 9076    budesonide  (PULMICORT ) nebulizer suspension 500 mcg, 0.5 mg, Nebulization, BID RT, Weidinger, Cara, APRN - CNP, 500 mcg at 08/04/24 9076    guaiFENesin  (MUCINEX ) extended release tablet 1,200 mg, 1,200 mg, Oral, BID, Weidinger, Cara, APRN - CNP, 1,200 mg at 08/04/24 2032    methylPREDNISolone  sodium succ (SOLU-MEDROL ) 60 mg in sterile water  0.96 mL injection, 60 mg, IntraVENous, Q12H, Jerome Cozette RAMAN, MD, 60 mg at 08/04/24 2032    Corean Beat, APRN - NP    [facility]  Call center: (P) [phone]  (F) [phone]      CC:Unknown, Provider     "

## 2024-08-05 NOTE — Progress Notes (Cosign Needed)
"  Occupational/Physical  therapy Note: Pt currently unavailable for tx having an ultrasound. Will cont to follow.  "

## 2024-08-05 NOTE — Progress Notes (Signed)
"  Pulmonary hospital follow-up appoinment scheduled with Dr.Katherine Price on Friday Decemeber 19th 2026 at 12:30 PM.   "

## 2024-08-05 NOTE — Progress Notes (Signed)
"  Pharmacy Dosing    Doxycycline  changed from IV to PO per Protocol (1:1 conversion)  - Takes other PO meds  - Diet : Regular       Thank you, Jannett Blanch PharmD (x [phone])    "

## 2024-08-05 NOTE — Progress Notes (Signed)
"     08/05/24 0832   Spirometry Assessment   COPD Exacerbations in last year 2  (several, most recent Nov 2025)   COPD Assessment (CAT Score)   $RT COPD Assessment Yes   GOLD Staging   Group Group E     Pt adm 12/14 on 6L NC.  Pt with mult adm this year.  Pt wears 2L O2 at baseline.  Pt former smoker, quit 2009.  Pt follows with Dr. Allaudin of [facility].  Will schedule follow up PAR appt.  Ansible consulted last adm.  "

## 2024-08-05 NOTE — Progress Notes (Signed)
 "[facility]  [address] [place], [place] [address]  [phone]    [facility]                                                                                          Hospitalist Progress Note  Cozette GORMAN Fear, MD        Date of Service:  08/05/2024  NAME:  Breanna Lester  DOB:  [birth year]  MRN:  [id number]      Interval history / Subjective:   Patient denies any new complaints today.  She had a couple runs of V. tach yesterday which were asymptomatic     Assessment & Plan:     Acute on chronic hypoxic respiratory failure  - Patient uses 2 L nasal cannula at baseline, currently requiring 2 L, which is her baseline  - Continue Solu-Medrol .  Will wean to prednisone  tomorrow  - Continue Pulmicort /Brovana , DuoNebs as needed  - Continue Rocephin /Doxy  - Pulmonology following    Acute on chronic HFpEF  - Continue with Lasix .  She has been noncompliant with Bumex   - Low-sodium diet    NSVT  - Seen by cardiology  -Limited echo pending  - Continue Lasix  on discharge  - 7-day event monitor on discharge    Type 2 diabetes  - Recent A1c 8.1  - Continue SSI, Lantus     Atrial fibrillation  - Restarted Eliquis .  Uncertain why she was taken off of it    Anxiety and depression  - Continue Zoloft       Outisde Records, prior notes, labs, radiology, and medications reviewed     Code status: Full code  DVT prophylaxis: Lovenox        Hospital Problems           Last Modified POA    * (Principal) COPD exacerbation (HCC) 08/03/2024 Yes    Acute on chronic respiratory failure with hypoxia (HCC) 08/03/2024 Yes    Shortness of breath 08/05/2024 Yes    NSVT (nonsustained ventricular tachycardia) (HCC) 08/05/2024 Yes    Acute on chronic heart failure with preserved ejection fraction (HFpEF) (HCC) 08/05/2024 Yes          Review of Systems:   Pertinent items are noted in HPI.       Vital Signs:    Last 24hrs VS reviewed since prior progress note. Most recent are:  Vitals:     08/05/24 1105   BP: (!) 144/66   Pulse:    Resp:    Temp:    SpO2:          Intake/Output Summary (Last 24 hours) at 08/05/2024 1453  Last data filed at 08/04/2024 2001  Gross per 24 hour   Intake 100 ml   Output --   Net 100 ml        Physical Examination:             Constitutional:  No acute distress, cooperative, pleasant    ENT:  Oral mucosa moist, oropharynx benign.    Resp:  CTA bilaterally. No wheezing/rhonchi/rales. No accessory  muscle use   CV:  Regular rhythm, normal rate, no murmurs, gallops, rubs    GI:  Soft, non distended, non tender. normoactive bowel sounds, no hepatosplenomegaly     Musculoskeletal:  No edema, warm, 2+ pulses throughout    Neurologic:  Moves all extremities.  AAOx3, CN II-XII reviewed     Psych:  Good insight, Not anxious nor agitated.       Data Review:    Review and/or order of clinical lab test      Labs:     Recent Labs     08/02/24  1836 08/04/24  0438   WBC 6.2 7.3   HGB 9.1* 9.1*   HCT 33.2* 31.4*   PLT 121* 105*     Recent Labs     08/02/24  1836 08/03/24  0637 08/05/24  1003   NA 142 141 142   K 4.3 4.5 4.1   CL 100 97* 93*   CO2 35* 35* 39*   BUN 20 17 24*   MG 2.0  --  2.0     Lab Results   Component Value Date/Time    ALT 18 08/02/2024 06:36 PM    ALT 21 07/04/2024 12:15 PM    ALT 19 07/02/2024 07:55 PM    GLOB 3.4 08/02/2024 06:36 PM    GLOB 3.1 07/04/2024 12:15 PM    GLOB 3.7 07/02/2024 07:55 PM     Lab Results   Component Value Date/Time    INR 1.3 05/03/2024 11:21 AM    INR 1.3 04/23/2024 07:01 PM      Lab Results   Component Value Date/Time    IRON 40 06/10/2024 03:00 PM    TIBC 264 06/10/2024 03:00 PM      No results found for: RBCF   No results for input(s): PH, PCO2, PO2 in the last 72 hours.  No results found for: CPK  No results found for: CHOL, CHLST, CHOLV, HDL, HDLC, LDL, LDLC  No results found for: GLUCPOC  No results found for: UA      Medications Reviewed:     Current Facility-Administered Medications   Medication Dose Route  Frequency    doxycycline  hyclate (VIBRA -TABS) tablet 100 mg  100 mg Oral 2 times per day    ipratropium 0.5 mg-albuterol  2.5 mg (DUONEB ) nebulizer solution 1 Dose  1 Dose Inhalation Q4H PRN    ketotifen  fumarate (ZADITOR ) 0.035 % ophthalmic solution 1 drop  1 drop Both Eyes BID    prochlorperazine  (COMPAZINE ) injection 10 mg  10 mg IntraVENous Q6H PRN    sodium chloride  flush 0.9 % injection 5-40 mL  5-40 mL IntraVENous 2 times per day    sodium chloride  flush 0.9 % injection 5-40 mL  5-40 mL IntraVENous PRN    0.9 % sodium chloride  infusion   IntraVENous PRN    potassium chloride  (KLOR-CON ) extended release tablet 40 mEq  40 mEq Oral PRN    Or    potassium bicarb-citric acid  (EFFER-K ) effervescent tablet 40 mEq  40 mEq Oral PRN    Or    potassium chloride  10 mEq/100 mL IVPB (Peripheral Line)  10 mEq IntraVENous PRN    magnesium  sulfate 2000 mg in 50 mL IVPB premix  2,000 mg IntraVENous PRN    polyethylene glycol (GLYCOLAX ) packet 17 g  17 g Oral Daily PRN    acetaminophen  (TYLENOL ) tablet 650 mg  650 mg Oral Q6H PRN    Or    acetaminophen  (TYLENOL ) suppository 650  mg  650 mg Rectal Q6H PRN    melatonin tablet 3 mg  3 mg Oral Nightly PRN    furosemide  (LASIX ) injection 40 mg  40 mg IntraVENous Daily    diclofenac  sodium (VOLTAREN ) 1 % gel 2 g  2 g Topical 4x Daily PRN    furosemide  (LASIX ) injection 40 mg  40 mg IntraVENous Once    sertraline  (ZOLOFT ) tablet 100 mg  100 mg Oral Daily    insulin  lispro (HUMALOG ,ADMELOG ) injection vial 0-8 Units  0-8 Units SubCUTAneous 4x Daily AC & HS    apixaban  (ELIQUIS ) tablet 5 mg  5 mg Oral BID    pantoprazole  (PROTONIX ) tablet 40 mg  40 mg Oral QAM AC    miconazole  (MICOTIN) 2 % powder   Topical BID    glucose chewable tablet 16 g  4 tablet Oral PRN    dextrose  bolus 10% 125 mL  125 mL IntraVENous PRN    Or    dextrose  bolus 10% 250 mL  250 mL IntraVENous PRN    glucagon  injection 1 mg  1 mg SubCUTAneous PRN    dextrose  10 % infusion   IntraVENous Continuous PRN    insulin   glargine (LANTUS ) injection vial 29 Units  0.25 Units/kg SubCUTAneous Daily    arformoterol  tartrate (BROVANA ) nebulizer solution 15 mcg  15 mcg Nebulization BID RT    budesonide  (PULMICORT ) nebulizer suspension 500 mcg  0.5 mg Nebulization BID RT    guaiFENesin  (MUCINEX ) extended release tablet 1,200 mg  1,200 mg Oral BID    methylPREDNISolone  sodium succ (SOLU-MEDROL ) 60 mg in sterile water  0.96 mL injection  60 mg IntraVENous Q12H     ______________________________________________________________________  EXPECTED LENGTH OF STAY:    ACTUAL LENGTH OF STAY:          2                 Cozette GORMAN Fear, MD   "

## 2024-08-05 NOTE — Care Coordination (Signed)
 "    Care Management Initial Assessment  08/05/2024 11:28 AM  If patient is discharged prior to next notation, then this note serves as note for discharge by case management.    Reason for Admission:   COPD exacerbation (HCC) [J44.1]  Acute on chronic respiratory failure with hypoxia (HCC) [J96.21]         Patient Admission Status: Inpatient  Date Admitted to INP: 08/03/24  RUR: Readmission Risk Score: 36.6    Hospitalization in the last 30 days (Readmission):  Yes         08/05/24 1119   Readmission Assessment   Who is being Interviewed Patient   Number of Days since last admission? 8-30 days   Previous Disposition Home with Family   What was the patient's/caregiver's perception as to why they think they needed to return back to the hospital? Other (Comment)  (Trouble breathing, diarrhea, nausea and vomiting)   Did you visit your Primary Care Physician after you left the hospital, before you returned this time? No   Why weren't you able to visit your PCP? Other (Comment)  (Does not have a PCP)   Did you see a specialist, such as Cardiac, Pulmonary, Orthopedic Physician, etc. after you left the hospital? No   Who advised the patient to return to the hospital? Self-referral   Does the patient report anything that got in the way of taking their medications? Yes   What reasons did they give? Didn't want to take them (Comment);No ability to pick up medications  (Felt depressed and was inconsistent taking meds.  Difficulty getting to a pharmacy and paying any co-pays.)   What could we have done to help prevent your return to the hospital? Other (Comment)  (I don't know.)   Have You Received a Follow Up Phone Call from a HealthCare Provider After Discharge? Other (comment)  (I'm not sure.)         Advance Care Planning:  Code Status: Full Code  Primary Healthcare Decision Maker: (P) Legal Next of Kin  Primary Decision Maker: Hirth,Jessica - Child [phone]   Advance Directive: has NO advanced directive - not  interested in additional information     __________________________________________________________________________  Assessment:      08/05/24 1121   Service Assessment   Information Provided By Patient   Patient Orientation Alert;Oriented   Cognition No Apparent Deficit   Primary Caregiver Self;Other (Comment)  (Daughter assists patient prn with showering.)   Support Systems Children   Patient's Healthcare Decision Maker is: Armed [employer] Oper[employer] Next of Kin   PCP Verified by CM No PCP  (Had a new PCP appointment scheduled on 9/24, but had difficulty with transportation to the appointment.)   Prior Functional Level Independent in ADLs/IADLs;Assistance with the following:;Bathing   Current Functional Level at Time of Initial Assessment Unable to Assess   Can patient return to prior living arrangement Yes   Ability to make needs known: Good   Family able to assist with home care needs: Yes   Receives Help From Family   Social/Functional History   Prior Level of Assist for ADLs Independent   Ambulation Assistance Independent   Active Driver No   Discharge Planning   Current Services Prior To Admission Durable Medical Equipment   Current DME Prior to Arrival Oxygen Therapy (Comment);Rollator;Other (Comment)  (Nebulizer, NIV and Home O2 concentrator from [employer].  Patient does not have portable tanks.)   Potential Assistance Needed Prescription Assistance;Transportation   Potential Assistance Obtaining Medications Yes  (  Has difficulty with transportation and lives on a limited income and is generally unable to afford any co-pays.)   Home Layout One level   Home Access Level entry   Services At/After Discharge   Who will provide transportation at discharge? Other (see comment)  (Will need transport with O2 back to motel.)   Social/Functional History   Home Equipment Rollator;Oxygen       Comments: Patient confirmed that she is staying at the [employer] in [place] with her daughter (room 101).  She has a home O2 concentrator from [employer]. But reports she does not have any portable tanks.  Patient reports that getting to the pharmacy is challenging and requests that her medications be filled at [facility] if at all possible prior to discharge.  Patient will need transport arranged with O2.  Ansible consult noted and referral placed in Careport/[facility].      Discharge Concerns: [x] Yes [] No [x] Unknown   Describe:     Financial concerns/barriers: [x] Yes, explain: Limited income, unstable housing and issues with consistent transportation.  [] No [] Unknown/Not discussed  __________________________________________________________________________    Insurer:   Active Insurance as of 08/03/2024       Primary Coverage       Payor Plan Insurance Group Employer/Plan Group    [place] BC[place] MEDICARE AN[place] BCBS [place] [employer] MEDIBLUE PLUS [id number]       Payor Plan Address Payor Plan Phone Number Payor Plan Fax Number Effective Dates    [name] [address]   07/11/2023 - None Entered    [place]  [place] [address]         Subscriber Name Subscriber Birth Date Member ID       TANYIA, GRABBE 08-20-1961 [id number]                     PCP: Unknown, Provider   Address: None   Phone number: None    Pharmacy:   [facility] [address], [place] - [address] - [place] [phone] [place] [phone]  [address] [place] [address]  Phone: [phone] Fax: [phone]    DC Transport: (P) Other (see comment) (Will need transport with O2 back to motel.)       Transition of care plan:    [] Unable to determine at this time. Awaiting clinical progress, and disposition recommendations.    []  Home. No assistance required.     []  Home. Pt refused recommended services.    [x]  Home with family assistance as needed    []  Home with Outpatient PT and outpatient follow-up   Pt aware of OP appt? [] Yes, Provider:   [] Not scheduled   Transport provider:     []  Home with outpatient services.    Specify:    []  Home with Home Health   - Freedom of Choice offered? []   Yes, Preference:   []  NA    [] SNF/IPR   -[] Freedom of Choice offered, and preferences given:   [] Listing provided and preferences requested   -Status: [] Pending [] Accepted:    -Auth required: [] Yes [] No    -Auth initiated date:   -3 midnight stay required: [] Yes [] No  Date satisfied:     []  LTC:     []  Home with Hospice   - Freedom of Choice offered? []  Yes, Preference:   []  NA    []  Dispatch Health information provided.     []  Other:       Ike DELENA Dollar  Case Management Department  For questions or concerns, please PerfectServe       "

## 2024-08-05 NOTE — Discharge Instructions (Signed)
"  Future Appointments   Date Time Provider Department Center   09/18/2024  1:00 PM Jerrie Nat ORN, APRN - NP [facility]       "

## 2024-08-06 LAB — POCT GLUCOSE
POC Glucose: 370 mg/dL — ABNORMAL HIGH (ref 65–117)
POC Glucose: 100 mg/dL (ref 65–117)
POC Glucose: 207 mg/dL — ABNORMAL HIGH (ref 65–117)

## 2024-08-06 MED ORDER — FLUTICASONE-SALMETEROL 250-50 MCG/ACT IN AEPB
250-50 | Freq: Two times a day (BID) | RESPIRATORY_TRACT | 0 refills | 90.00000 days | Status: AC
Start: 2024-08-06 — End: ?
  Filled 2024-08-06: qty 60, 30d supply, fill #0

## 2024-08-06 MED ORDER — APIXABAN 5 MG PO TABS
5 | ORAL_TABLET | Freq: Two times a day (BID) | ORAL | 0 refills | 30.00000 days | Status: AC
Start: 2024-08-06 — End: ?
  Filled 2024-08-06: qty 60, 30d supply, fill #0

## 2024-08-06 MED ORDER — PREDNISONE 10 MG PO TABS
10 | ORAL_TABLET | ORAL | 0 refills | 5.00000 days | Status: AC
Start: 2024-08-06 — End: 2024-08-18
  Filled 2024-08-06: qty 30, 12d supply, fill #0

## 2024-08-06 MED ORDER — BUMETANIDE 1 MG PO TABS
1 | ORAL_TABLET | Freq: Every day | ORAL | 0 refills | 30.00000 days | Status: AC
Start: 2024-08-06 — End: ?
  Filled 2024-08-06: qty 30, 30d supply, fill #0

## 2024-08-06 MED ORDER — DOXYCYCLINE HYCLATE 100 MG PO TABS
100 | ORAL_TABLET | Freq: Two times a day (BID) | ORAL | 0 refills | 7.00000 days | Status: AC
Start: 2024-08-06 — End: 2024-08-07
  Filled 2024-08-06: qty 2, 1d supply, fill #0

## 2024-08-06 MED ORDER — METOPROLOL TARTRATE 50 MG PO TABS
50 | ORAL_TABLET | Freq: Two times a day (BID) | ORAL | 0 refills | 90.00000 days | Status: AC
Start: 2024-08-06 — End: ?
  Filled 2024-08-06: qty 60, 30d supply, fill #0

## 2024-08-06 MED FILL — DOXYCYCLINE HYCLATE 100 MG PO TABS: 100 mg | ORAL | Qty: 1

## 2024-08-06 MED FILL — MELATONIN 3 MG PO TABS: 3 mg | ORAL | Qty: 1

## 2024-08-06 MED FILL — GUAIFENESIN ER 600 MG PO TB12: 600 mg | ORAL | Qty: 2

## 2024-08-06 MED FILL — ACETAMINOPHEN 325 MG PO TABS: 325 mg | ORAL | Qty: 2

## 2024-08-06 MED FILL — METHYLPREDNISOLONE SODIUM SUCC 125 MG IJ SOLR: 125 mg | INTRAMUSCULAR | Qty: 125

## 2024-08-06 MED FILL — INSULIN LISPRO 100 UNIT/ML IJ SOLN: 100 [IU]/mL | INTRAMUSCULAR | Qty: 8

## 2024-08-06 MED FILL — ELIQUIS 5 MG PO TABS: 5 mg | ORAL | Qty: 1

## 2024-08-06 NOTE — Progress Notes (Signed)
"  Discharge paperwork provided to patient at bedside. All questions answered and patient verbalized understanding.   "

## 2024-08-06 NOTE — Progress Notes (Signed)
"    Physician Progress Note      PATIENT:               Breanna Lester, Breanna Lester  CSN #:                  [id number]  DOB:                       01-24-1961  ADMIT DATE:       08/03/2024 12:16 AM  DISCH DATE:        08/06/2024 3:41 PM  RESPONDING  PROVIDER #:        Cozette GORMAN Fairly MD          QUERY TEXT:    Acute on chronic hypoxic hypercapnic respiratory failure is documented in the   medical record Gayle Ted BROCKS, MD at 08/05/2024. Please provide additional   clinical indicators supportive of this diagnosis or please document if the   diagnosis has been ruled out after study.    The clinical indicators include:  '' 63 y.o. female with PMH significant for PAF taken off Eliquis  6 weeks ago   at [facility], copd on 2L O2 at home x 4 months, heart failure preserved EF 70 to   75%, diabetes, HTN, hyperlipidemia, liver cirrhosis   presented with shortness   of breath and cough-(Consults by Wilkie Corean LABOR, APRN - NP at   08/05/2024)''    ''Acute on chronic hypoxic hypercapnic respiratory failure, in setting of   volume overload, +/- COPD exacerbation, 2 L at baseline, previously on NIV   therapy but recently self-discontinued due to poor tolerance -(Consults by   Gayle Ted BROCKS, MD at 08/05/2024)''    ''Acute on chronic hypoxic respiratory failure Patient uses 2 L nasal cannula   at baseline, currently requiring 2 L, which is her baseline,  Continue Pulmicort /Brovana , DuoNebs as needed-(Progress Notes by Jerome Cozette GORMAN, MD at 08/05/2024)''    ''No resp distress noted, appears stated age, on oxygen, obese -(Consults by   Barbar Ard, APRN - CNP at 08/03/2024)''    ''Resp:CTA bilaterally. No wheezing/rhonchi/rales. No accessory muscle   use-(Progress Notes by Jerome Cozette GORMAN, MD at 08/05/2024)''    From EPIC flow sheets  (12/14) RR:13,19: DEN7:05,04  (12/15) RR:19,17: SPO2:98,100  (12/16) RR:15,16: SPO2:98,96    From EPIC result Review  Blood gas analysis (12/14-12/16)35,39  -Oxygen therapy Nasal cannula (12/14)-3L/min;  (12/15)-1.5 L/min,   (12/16)-2L/min  - Continuous pulse oximetry, Blood gas analysis, Pulmonology consultation    Thank you,  Abhijith Appukuttan, AHS, CDS  Options provided:  -- Acute Respiratory Failure with Hypoxia and Hypercapnia as evidenced by,   Please document evidence.  -- Acute Respiratory Failure ruled out after study and Chronic Respiratory   Failure with Hypoxia and Hypercapnia confirmed  -- Other - I will add my own diagnosis  -- Disagree - Not applicable / Not valid  -- Refer to Clinical Documentation Reviewer    PROVIDER RESPONSE TEXT:    Acute Respiratory Failure ruled out after study and Chronic Respiratory   Failure with Hypoxia and Hypercapnia confirmed.    Query created by: Artemisa Dowse on 08/06/2024 7:06 AM      Electronically signed by:  Cozette GORMAN Fairly MD 08/07/2024 1:27 PM          "

## 2024-08-06 NOTE — Care Coordination (Addendum)
"      Case Management Discharge Note:    Discharge Plan:  Patient discharging to home today with no other needs.    A portable tank has been delivered at bedside by [employer] [facility].  Patient is on continuous home O2 at 2L NC.  Patient confirmed that she has a concentrator at home.  She also confirmed that she has the contact info to [employer], if she needs portable tanks delivered to her.      DC Meds:  CM obtained patient's meds at [facility] and provided them to the patient.  Total cost: $19.    Transportation:  Roundtrip via Uber/Lyft.  Pick-up time:  1540.    Patient stated no other needs.  Medical treatment team informed of updates.    ______________________________________  Creighton Mulling, BSN, RN-CM  Shelvy Gwenn Thresa Bernardino- Care Management  Available via PerfectServe  08/06/2024  3:25 PM    "

## 2024-08-06 NOTE — Discharge Summary (Signed)
 "[facility]  [address] [place], [place] [address]  [phone]    [facility]     Discharge Summary       PATIENT ID: Breanna Lester  MRN: [id number]   DATE OF BIRTH: 06-18-1961    DATE OF ADMISSION: 08/03/2024 12:16 AM    DATE OF DISCHARGE: 08/06/24   PRIMARY CARE PROVIDER: Unknown, Provider     DISCHARGING PROVIDER: Cozette GORMAN Fear, MD      CONSULTATIONS: IP CONSULT TO PULMONOLOGY  IP CONSULT TO CASE MANAGEMENT  IP CONSULT TO CASE MANAGEMENT  IP CONSULT TO CARDIOLOGY    PROCEDURES/SURGERIES: * No surgery found *    ADMITTING DIAGNOSES & HOSPITAL COURSE:     Acute on chronic hypoxic respiratory failure  - Patient uses 2 L nasal cannula at baseline, currently requiring 2 L, which is her baseline  - Continue prednisone  taper   - started advair    - complete course of doxy PO  - Pulmonology following, outpatient f/u in 2 days      Acute on chronic HFpEF  - Continue with bumex . Advised on compliance  - Low-sodium diet  -cont metoprolol      NSVT  - Seen by cardiology  -Limited echo normal  -cont metoprolol   - Continue Lasix  on discharge  - 7-day event monitor on discharge     Type 2 diabetes  - Recent A1c 8.1  - Continue SSI, Lantus      Atrial fibrillation  - Restarted Eliquis .  Uncertain why she was taken off of it     Anxiety and depression  - Continue Zoloft         PENDING TEST RESULTS:   At the time of discharge the following test results are still pending: none    FOLLOW UP APPOINTMENTS:    [place]  [address]  [place] [address]  [phone]    Go on 08/08/2024  Pulmonary hospital follow-up appoinment scheduled with Dr.Katherine Price on Friday Decemeber 19th 2026 at 12:30 PM.         DIET: regular     ACTIVITY: as tolerated       DISCHARGE MEDICATIONS:     Medication List        START taking these medications      doxycycline  hyclate 100 MG tablet  Commonly known as: VIBRA -TABS  Take 1 tablet by mouth every 12 hours  for 2 doses  Replaces: doxycycline  hyclate 100 MG capsule     fluticasone -salmeterol 250-50 MCG/ACT Aepb diskus inhaler  Commonly known as: Advair  Diskus  Inhale 1 puff into the lungs in the morning and 1 puff in the evening.            CHANGE how you take these medications      predniSONE  10 MG tablet  Commonly known as: DELTASONE   Take 1 tablet by mouth daily for 3 days, THEN 3 tablets daily for 3 days, THEN 2 tablets daily for 3 days, THEN 1 tablet daily for 3 days.  Start taking on: August 06, 2024  What changed:   medication strength  See the new instructions.            CONTINUE taking these medications      acetaminophen  325 MG tablet  Commonly known as: TYLENOL   Take 1 tablet by mouth every 6 hours as needed for Pain     * albuterol  (2.5 MG/3ML) 0.083% nebulizer  solution  Commonly known as: PROVENTIL      * albuterol  sulfate HFA 108 (90 Base) MCG/ACT inhaler  Commonly known as: Ventolin  HFA  Inhale 2 puffs into the lungs 4 times daily as needed for Wheezing     * albuterol  sulfate HFA 108 (90 Base) MCG/ACT inhaler  Commonly known as: Ventolin  HFA  Inhale 2 puffs into the lungs 4 times daily as needed for Wheezing     apixaban  5 MG Tabs tablet  Commonly known as: ELIQUIS   Take 1 tablet by mouth 2 times daily     Blood Glucose Monitor System w/Device Kit  Pharmacist to identify preferred meter and strips.     blood glucose test strips  Test 1-2 times a day & as needed for symptoms of irregular blood glucose. Dispense sufficient amount for indicated testing frequency plus additional to accommodate PRN testing needs. Pharmacist to identify preferred brand.     bumetanide  1 MG tablet  Commonly known as: BUMEX   Take 1 tablet by mouth daily     guaiFENesin  600 MG extended release tablet  Commonly known as: MUCINEX   Take 1 tablet by mouth 2 times daily     lactobacillus capsule  Take 1 capsule by mouth daily (with breakfast)     Lancets 30G Misc  Test 1-2 times a day & as needed for symptoms of irregular blood  glucose. Dispense sufficient amount for indicated testing frequency plus additional to accommodate PRN testing needs. Pharmacist to identify preferred brand.     Melatonin 10 MG Tabs     metoprolol  tartrate 50 MG tablet  Commonly known as: LOPRESSOR   Take 1 tablet by mouth 2 times daily     pantoprazole  40 MG tablet  Commonly known as: PROTONIX   Take 1 tablet by mouth 2 times daily (before meals)     sertraline  100 MG tablet  Commonly known as: ZOLOFT   Take 1 tablet by mouth daily           * This list has 3 medication(s) that are the same as other medications prescribed for you. Read the directions carefully, and ask your doctor or other care provider to review them with you.                STOP taking these medications      atorvastatin  40 MG tablet  Commonly known as: LIPITOR      doxycycline  hyclate 100 MG capsule  Commonly known as: VIBRAMYCIN   Replaced by: doxycycline  hyclate 100 MG tablet     metFORMIN  500 MG extended release tablet  Commonly known as: GLUCOPHAGE -XR     OLANZapine  10 MG tablet  Commonly known as: ZYPREXA                Where to Get Your Medications        These medications were sent to [facility] [address], [place] - [address]  [address] - [place] [phone] - F [phone]  [address] [place]  [address], [place] [address]      Phone: [phone]   apixaban  5 MG Tabs tablet  bumetanide  1 MG tablet  doxycycline  hyclate 100 MG tablet  fluticasone -salmeterol 250-50 MCG/ACT Aepb diskus inhaler  metoprolol  tartrate 50 MG tablet  predniSONE  10 MG tablet           NOTIFY YOUR PHYSICIAN FOR ANY OF THE FOLLOWING:   Fever over 101 degrees for 24 hours.   Chest pain, shortness of breath, fever, chills, nausea,  vomiting, diarrhea, change in mentation, falling, weakness, bleeding. Severe pain or pain not relieved by medications.  Or, any other signs or symptoms that you may have questions about.    DISPOSITION:  Home w/Family      PHYSICAL EXAMINATION AT DISCHARGE:  General:           Alert, cooperative, no distress, appears stated age.     HEENT:           Atraumatic, anicteric sclerae, pink conjunctivae                          No oral ulcers, mucosa moist, throat clear, dentition fair  Neck:               Supple, symmetrical  Lungs:             Clear to auscultation bilaterally.  No Wheezing or Rhonchi. No rales.  Heart:              Regular  rhythm,  No  murmur   No edema  Abdomen:        Soft, non-tender. Not distended.  Bowel sounds normal  Extremities:     No cyanosis.  No clubbing,                            Skin turgor normal, Capillary refill normal  Skin:                Not pale.  Not Jaundiced  No rashes   Psych:             Not anxious or agitated.  Neurologic:      Alert, moves all extremities, answers questions appropriately and responds to commands       CHRONIC MEDICAL DIAGNOSES:      Greater than 30 minutes were spent with the patient on counseling and coordination of care    Signed:   Cozette GORMAN Fear, MD  08/06/2024  11:27 AM  "

## 2024-08-08 LAB — CULTURE, BLOOD 1: Culture: NO GROWTH

## 2024-08-08 LAB — CULTURE, BLOOD 2: Culture: NO GROWTH

## 2024-09-18 ENCOUNTER — Ambulatory Visit: Payer: Medicare (Managed Care) | Primary: Diagnostic Radiology
